# Patient Record
Sex: Male | Born: 1935 | Race: White | Hispanic: No | Smoking: Current some day smoker
Health system: Southern US, Community
[De-identification: ages and names within clinical notes are randomized; demographics above are authoritative.]

## PROBLEM LIST (undated history)

## (undated) DIAGNOSIS — K5900 Constipation, unspecified: Secondary | ICD-10-CM

## (undated) DIAGNOSIS — F32A Depression, unspecified: Secondary | ICD-10-CM

## (undated) DIAGNOSIS — G4700 Insomnia, unspecified: Secondary | ICD-10-CM

## (undated) DIAGNOSIS — G4730 Sleep apnea, unspecified: Secondary | ICD-10-CM

## (undated) DIAGNOSIS — E119 Type 2 diabetes mellitus without complications: Secondary | ICD-10-CM

## (undated) DIAGNOSIS — I701 Atherosclerosis of renal artery: Secondary | ICD-10-CM

## (undated) DIAGNOSIS — N400 Enlarged prostate without lower urinary tract symptoms: Secondary | ICD-10-CM

## (undated) DIAGNOSIS — N529 Male erectile dysfunction, unspecified: Secondary | ICD-10-CM

## (undated) DIAGNOSIS — F329 Major depressive disorder, single episode, unspecified: Secondary | ICD-10-CM

## (undated) DIAGNOSIS — I739 Peripheral vascular disease, unspecified: Secondary | ICD-10-CM

## (undated) DIAGNOSIS — M1990 Unspecified osteoarthritis, unspecified site: Secondary | ICD-10-CM

## (undated) DIAGNOSIS — Z8614 Personal history of Methicillin resistant Staphylococcus aureus infection: Secondary | ICD-10-CM

## (undated) DIAGNOSIS — I4891 Unspecified atrial fibrillation: Secondary | ICD-10-CM

## (undated) DIAGNOSIS — I2510 Atherosclerotic heart disease of native coronary artery without angina pectoris: Secondary | ICD-10-CM

## (undated) DIAGNOSIS — I10 Essential (primary) hypertension: Secondary | ICD-10-CM

## (undated) DIAGNOSIS — I519 Heart disease, unspecified: Secondary | ICD-10-CM

## (undated) HISTORY — PX: CORONARY ANGIOPLASTY: SHX604

## (undated) HISTORY — PX: CORONARY ANGIOPLASTY WITH STENT PLACEMENT: SHX49

## (undated) HISTORY — DX: Atherosclerotic heart disease of native coronary artery without angina pectoris: I25.10

## (undated) HISTORY — DX: Atherosclerosis of renal artery: I70.1

## (undated) HISTORY — PX: TURP VAPORIZATION: SUR1397

## (undated) HISTORY — PX: ADENOIDECTOMY: SUR15

## (undated) HISTORY — DX: Essential (primary) hypertension: I10

## (undated) HISTORY — PX: COLONOSCOPY: SHX174

## (undated) HISTORY — DX: Heart disease, unspecified: I51.9

## (undated) HISTORY — DX: Benign prostatic hyperplasia without lower urinary tract symptoms: N40.0

## (undated) HISTORY — DX: Unspecified atrial fibrillation: I48.91

## (undated) HISTORY — DX: Male erectile dysfunction, unspecified: N52.9

## (undated) HISTORY — PX: TONSILLECTOMY: SUR1361

---

## 1997-06-15 ENCOUNTER — Other Ambulatory Visit: Admission: RE | Admit: 1997-06-15 | Discharge: 1997-06-15 | Payer: Self-pay | Admitting: Urology

## 1998-06-30 ENCOUNTER — Observation Stay (HOSPITAL_COMMUNITY): Admission: RE | Admit: 1998-06-30 | Discharge: 1998-07-01 | Payer: Self-pay | Admitting: Specialist

## 1998-07-11 ENCOUNTER — Ambulatory Visit (HOSPITAL_COMMUNITY): Admission: RE | Admit: 1998-07-11 | Discharge: 1998-07-11 | Payer: Self-pay | Admitting: Gastroenterology

## 1998-10-30 ENCOUNTER — Emergency Department (HOSPITAL_COMMUNITY): Admission: EM | Admit: 1998-10-30 | Discharge: 1998-10-30 | Payer: Self-pay | Admitting: Emergency Medicine

## 1998-11-04 ENCOUNTER — Encounter: Payer: Self-pay | Admitting: Emergency Medicine

## 1998-11-04 ENCOUNTER — Emergency Department (HOSPITAL_COMMUNITY): Admission: EM | Admit: 1998-11-04 | Discharge: 1998-11-04 | Payer: Self-pay

## 1998-12-04 ENCOUNTER — Encounter: Payer: Self-pay | Admitting: Orthopedic Surgery

## 1998-12-06 ENCOUNTER — Inpatient Hospital Stay (HOSPITAL_COMMUNITY): Admission: EM | Admit: 1998-12-06 | Discharge: 1998-12-07 | Payer: Self-pay | Admitting: Orthopedic Surgery

## 1999-03-05 HISTORY — PX: BICEPS TENDON REPAIR: SHX566

## 1999-03-05 HISTORY — PX: SHOULDER OPEN ROTATOR CUFF REPAIR: SHX2407

## 1999-07-06 ENCOUNTER — Encounter: Payer: Self-pay | Admitting: Family Medicine

## 1999-07-06 ENCOUNTER — Encounter: Admission: RE | Admit: 1999-07-06 | Discharge: 1999-07-06 | Payer: Self-pay | Admitting: Family Medicine

## 1999-07-18 ENCOUNTER — Emergency Department (HOSPITAL_COMMUNITY): Admission: EM | Admit: 1999-07-18 | Discharge: 1999-07-18 | Payer: Self-pay | Admitting: Emergency Medicine

## 1999-10-04 ENCOUNTER — Encounter (INDEPENDENT_AMBULATORY_CARE_PROVIDER_SITE_OTHER): Payer: Self-pay | Admitting: Specialist

## 1999-10-04 ENCOUNTER — Other Ambulatory Visit: Admission: RE | Admit: 1999-10-04 | Discharge: 1999-10-04 | Payer: Self-pay | Admitting: Urology

## 1999-10-11 ENCOUNTER — Inpatient Hospital Stay (HOSPITAL_COMMUNITY): Admission: EM | Admit: 1999-10-11 | Discharge: 1999-10-15 | Payer: Self-pay

## 1999-12-18 ENCOUNTER — Ambulatory Visit (HOSPITAL_COMMUNITY): Admission: RE | Admit: 1999-12-18 | Discharge: 1999-12-18 | Payer: Self-pay | Admitting: Gastroenterology

## 1999-12-18 ENCOUNTER — Encounter (INDEPENDENT_AMBULATORY_CARE_PROVIDER_SITE_OTHER): Payer: Self-pay

## 2000-01-09 ENCOUNTER — Ambulatory Visit (HOSPITAL_BASED_OUTPATIENT_CLINIC_OR_DEPARTMENT_OTHER): Admission: RE | Admit: 2000-01-09 | Discharge: 2000-01-09 | Payer: Self-pay | Admitting: Urology

## 2000-02-05 ENCOUNTER — Encounter: Admission: RE | Admit: 2000-02-05 | Discharge: 2000-02-05 | Payer: Self-pay | Admitting: Gastroenterology

## 2000-02-05 ENCOUNTER — Encounter: Payer: Self-pay | Admitting: Gastroenterology

## 2000-03-04 HISTORY — PX: CARPAL TUNNEL RELEASE: SHX101

## 2000-04-17 ENCOUNTER — Ambulatory Visit (HOSPITAL_COMMUNITY): Admission: RE | Admit: 2000-04-17 | Discharge: 2000-04-17 | Payer: Self-pay | Admitting: Cardiology

## 2000-04-28 ENCOUNTER — Ambulatory Visit (HOSPITAL_COMMUNITY): Admission: RE | Admit: 2000-04-28 | Discharge: 2000-04-28 | Payer: Self-pay | Admitting: Cardiovascular Disease

## 2000-08-02 ENCOUNTER — Emergency Department (HOSPITAL_COMMUNITY): Admission: EM | Admit: 2000-08-02 | Discharge: 2000-08-02 | Payer: Self-pay | Admitting: Emergency Medicine

## 2000-12-03 ENCOUNTER — Ambulatory Visit (HOSPITAL_COMMUNITY): Admission: RE | Admit: 2000-12-03 | Discharge: 2000-12-03 | Payer: Self-pay | Admitting: Cardiology

## 2001-02-16 ENCOUNTER — Ambulatory Visit (HOSPITAL_COMMUNITY): Admission: RE | Admit: 2001-02-16 | Discharge: 2001-02-16 | Payer: Self-pay | Admitting: Interventional Cardiology

## 2001-03-20 ENCOUNTER — Encounter: Payer: Self-pay | Admitting: Orthopedic Surgery

## 2001-03-20 ENCOUNTER — Observation Stay (HOSPITAL_COMMUNITY): Admission: RE | Admit: 2001-03-20 | Discharge: 2001-03-21 | Payer: Self-pay | Admitting: Orthopedic Surgery

## 2001-03-20 HISTORY — PX: FOOT SURGERY: SHX648

## 2003-06-30 ENCOUNTER — Ambulatory Visit (HOSPITAL_COMMUNITY): Admission: RE | Admit: 2003-06-30 | Discharge: 2003-07-01 | Payer: Self-pay | Admitting: Otolaryngology

## 2003-06-30 ENCOUNTER — Encounter (INDEPENDENT_AMBULATORY_CARE_PROVIDER_SITE_OTHER): Payer: Self-pay | Admitting: Specialist

## 2003-06-30 HISTORY — PX: UVULOPALATOPHARYNGOPLASTY (UPPP)/TONSILLECTOMY/SEPTOPLASTY: SHX6164

## 2003-07-26 ENCOUNTER — Encounter: Admission: RE | Admit: 2003-07-26 | Discharge: 2003-07-26 | Payer: Self-pay | Admitting: Otolaryngology

## 2004-01-06 ENCOUNTER — Encounter: Admission: RE | Admit: 2004-01-06 | Discharge: 2004-01-06 | Payer: Self-pay | Admitting: Otolaryngology

## 2005-03-04 HISTORY — PX: INSERT / REPLACE / REMOVE PACEMAKER: SUR710

## 2005-03-26 ENCOUNTER — Ambulatory Visit: Payer: Self-pay | Admitting: Gastroenterology

## 2005-04-04 ENCOUNTER — Ambulatory Visit: Payer: Self-pay | Admitting: Family Medicine

## 2005-04-12 ENCOUNTER — Ambulatory Visit: Payer: Self-pay | Admitting: Gastroenterology

## 2005-04-23 ENCOUNTER — Ambulatory Visit: Payer: Self-pay | Admitting: Gastroenterology

## 2005-05-06 ENCOUNTER — Ambulatory Visit (HOSPITAL_COMMUNITY): Admission: RE | Admit: 2005-05-06 | Discharge: 2005-05-06 | Payer: Self-pay | Admitting: Gastroenterology

## 2005-05-13 ENCOUNTER — Ambulatory Visit (HOSPITAL_COMMUNITY): Admission: RE | Admit: 2005-05-13 | Discharge: 2005-05-13 | Payer: Self-pay | Admitting: Gastroenterology

## 2006-01-06 ENCOUNTER — Ambulatory Visit: Payer: Self-pay | Admitting: Family Medicine

## 2006-04-08 ENCOUNTER — Ambulatory Visit: Payer: Self-pay | Admitting: Family Medicine

## 2006-04-25 ENCOUNTER — Ambulatory Visit: Payer: Self-pay | Admitting: Family Medicine

## 2006-05-30 ENCOUNTER — Ambulatory Visit: Payer: Self-pay | Admitting: Internal Medicine

## 2006-10-02 ENCOUNTER — Inpatient Hospital Stay (HOSPITAL_BASED_OUTPATIENT_CLINIC_OR_DEPARTMENT_OTHER): Admission: RE | Admit: 2006-10-02 | Discharge: 2006-10-02 | Payer: Self-pay | Admitting: Interventional Cardiology

## 2006-10-13 ENCOUNTER — Inpatient Hospital Stay (HOSPITAL_COMMUNITY): Admission: RE | Admit: 2006-10-13 | Discharge: 2006-10-14 | Payer: Self-pay | Admitting: Interventional Cardiology

## 2006-10-27 ENCOUNTER — Ambulatory Visit (HOSPITAL_COMMUNITY): Admission: RE | Admit: 2006-10-27 | Discharge: 2006-10-28 | Payer: Self-pay | Admitting: Interventional Cardiology

## 2006-11-06 ENCOUNTER — Ambulatory Visit: Payer: Self-pay | Admitting: Gastroenterology

## 2006-11-20 ENCOUNTER — Encounter: Payer: Self-pay | Admitting: Family Medicine

## 2006-12-03 ENCOUNTER — Ambulatory Visit (HOSPITAL_COMMUNITY): Admission: RE | Admit: 2006-12-03 | Discharge: 2006-12-03 | Payer: Self-pay | Admitting: Interventional Cardiology

## 2006-12-03 ENCOUNTER — Ambulatory Visit: Payer: Self-pay | Admitting: Vascular Surgery

## 2006-12-03 ENCOUNTER — Encounter (INDEPENDENT_AMBULATORY_CARE_PROVIDER_SITE_OTHER): Payer: Self-pay | Admitting: Interventional Cardiology

## 2007-01-01 ENCOUNTER — Encounter (HOSPITAL_COMMUNITY): Admission: RE | Admit: 2007-01-01 | Discharge: 2007-03-04 | Payer: Self-pay | Admitting: Interventional Cardiology

## 2007-01-01 ENCOUNTER — Encounter: Payer: Self-pay | Admitting: Family Medicine

## 2007-01-23 ENCOUNTER — Ambulatory Visit: Payer: Self-pay | Admitting: Family Medicine

## 2007-01-23 DIAGNOSIS — M7710 Lateral epicondylitis, unspecified elbow: Secondary | ICD-10-CM | POA: Insufficient documentation

## 2007-01-23 DIAGNOSIS — Z87898 Personal history of other specified conditions: Secondary | ICD-10-CM

## 2007-01-23 DIAGNOSIS — E785 Hyperlipidemia, unspecified: Secondary | ICD-10-CM | POA: Insufficient documentation

## 2007-01-23 DIAGNOSIS — N400 Enlarged prostate without lower urinary tract symptoms: Secondary | ICD-10-CM | POA: Insufficient documentation

## 2007-01-23 DIAGNOSIS — I482 Chronic atrial fibrillation, unspecified: Secondary | ICD-10-CM

## 2007-01-23 DIAGNOSIS — I10 Essential (primary) hypertension: Secondary | ICD-10-CM | POA: Insufficient documentation

## 2007-03-05 ENCOUNTER — Encounter (HOSPITAL_COMMUNITY): Admission: RE | Admit: 2007-03-05 | Discharge: 2007-05-02 | Payer: Self-pay | Admitting: Interventional Cardiology

## 2007-03-05 HISTORY — PX: RENAL ARTERY STENT: SHX2321

## 2007-04-14 ENCOUNTER — Encounter: Payer: Self-pay | Admitting: Family Medicine

## 2007-09-18 ENCOUNTER — Emergency Department (HOSPITAL_COMMUNITY): Admission: EM | Admit: 2007-09-18 | Discharge: 2007-09-18 | Payer: Self-pay | Admitting: Emergency Medicine

## 2007-09-18 ENCOUNTER — Ambulatory Visit: Payer: Self-pay | Admitting: Family Medicine

## 2007-09-18 DIAGNOSIS — M109 Gout, unspecified: Secondary | ICD-10-CM

## 2007-09-18 DIAGNOSIS — I2510 Atherosclerotic heart disease of native coronary artery without angina pectoris: Secondary | ICD-10-CM | POA: Insufficient documentation

## 2007-10-02 ENCOUNTER — Encounter (INDEPENDENT_AMBULATORY_CARE_PROVIDER_SITE_OTHER): Payer: Self-pay | Admitting: *Deleted

## 2007-10-20 ENCOUNTER — Encounter: Payer: Self-pay | Admitting: Family Medicine

## 2008-01-04 ENCOUNTER — Encounter: Admission: RE | Admit: 2008-01-04 | Discharge: 2008-01-04 | Payer: Self-pay | Admitting: Orthopedic Surgery

## 2008-03-07 ENCOUNTER — Encounter: Payer: Self-pay | Admitting: Family Medicine

## 2008-08-22 ENCOUNTER — Ambulatory Visit: Payer: Self-pay | Admitting: Family Medicine

## 2008-08-22 DIAGNOSIS — R1084 Generalized abdominal pain: Secondary | ICD-10-CM

## 2008-08-23 ENCOUNTER — Encounter: Payer: Self-pay | Admitting: Family Medicine

## 2008-08-24 LAB — CONVERTED CEMR LAB
ALT: 28 units/L (ref 0–53)
AST: 25 units/L (ref 0–37)
Albumin: 3.8 g/dL (ref 3.5–5.2)
Alkaline Phosphatase: 44 units/L (ref 39–117)
Amylase: 40 units/L (ref 27–131)
BUN: 28 mg/dL — ABNORMAL HIGH (ref 6–23)
Basophils Absolute: 0 10*3/uL (ref 0.0–0.1)
Basophils Relative: 0.1 % (ref 0.0–3.0)
Bilirubin, Direct: 0.1 mg/dL (ref 0.0–0.3)
CO2: 27 meq/L (ref 19–32)
Calcium: 9.2 mg/dL (ref 8.4–10.5)
Chloride: 104 meq/L (ref 96–112)
Creatinine, Ser: 1.5 mg/dL (ref 0.4–1.5)
Eosinophils Absolute: 0 10*3/uL (ref 0.0–0.7)
Eosinophils Relative: 0.3 % (ref 0.0–5.0)
GFR calc non Af Amer: 48.84 mL/min (ref >60)
Glucose, Bld: 138 mg/dL — ABNORMAL HIGH (ref 70–99)
HCT: 45.7 % (ref 39.0–52.0)
Hemoglobin: 15.9 g/dL (ref 13.0–17.0)
Lymphocytes Relative: 9.4 % — ABNORMAL LOW (ref 12.0–46.0)
Lymphs Abs: 0.9 10*3/uL (ref 0.7–4.0)
MCHC: 34.8 g/dL (ref 30.0–36.0)
MCV: 87.8 fL (ref 78.0–100.0)
Monocytes Absolute: 0.2 10*3/uL (ref 0.1–1.0)
Monocytes Relative: 1.7 % — ABNORMAL LOW (ref 3.0–12.0)
Neutro Abs: 8.1 10*3/uL — ABNORMAL HIGH (ref 1.4–7.7)
Neutrophils Relative %: 88.5 % — ABNORMAL HIGH (ref 43.0–77.0)
Platelets: 169 10*3/uL (ref 150.0–400.0)
Potassium: 4.9 meq/L (ref 3.5–5.1)
RBC: 5.2 M/uL (ref 4.22–5.81)
RDW: 13.5 % (ref 11.5–14.6)
Sodium: 140 meq/L (ref 135–145)
TSH: 1.04 microintl units/mL (ref 0.35–5.50)
Total Bilirubin: 1.3 mg/dL — ABNORMAL HIGH (ref 0.3–1.2)
Total Protein: 7 g/dL (ref 6.0–8.3)
WBC: 9.2 10*3/uL (ref 4.5–10.5)

## 2008-11-03 ENCOUNTER — Encounter (INDEPENDENT_AMBULATORY_CARE_PROVIDER_SITE_OTHER): Payer: Self-pay | Admitting: *Deleted

## 2008-11-18 ENCOUNTER — Encounter: Payer: Self-pay | Admitting: Family Medicine

## 2008-12-02 ENCOUNTER — Encounter: Payer: Self-pay | Admitting: Family Medicine

## 2008-12-09 ENCOUNTER — Encounter: Payer: Self-pay | Admitting: Family Medicine

## 2008-12-29 ENCOUNTER — Ambulatory Visit: Payer: Self-pay | Admitting: Family Medicine

## 2008-12-29 DIAGNOSIS — R42 Dizziness and giddiness: Secondary | ICD-10-CM | POA: Insufficient documentation

## 2008-12-29 LAB — CONVERTED CEMR LAB
Bilirubin Urine: NEGATIVE
Blood in Urine, dipstick: NEGATIVE
Ketones, urine, test strip: NEGATIVE
Nitrite: NEGATIVE
Specific Gravity, Urine: 1.015
Urobilinogen, UA: 0.2
WBC Urine, dipstick: NEGATIVE
pH: 5

## 2008-12-30 LAB — CONVERTED CEMR LAB
ALT: 33 units/L (ref 0–53)
AST: 21 units/L (ref 0–37)
Albumin: 3.4 g/dL — ABNORMAL LOW (ref 3.5–5.2)
Alkaline Phosphatase: 51 units/L (ref 39–117)
BUN: 46 mg/dL — ABNORMAL HIGH (ref 6–23)
Basophils Absolute: 0 10*3/uL (ref 0.0–0.1)
Basophils Relative: 0.1 % (ref 0.0–3.0)
Bilirubin, Direct: 0.1 mg/dL (ref 0.0–0.3)
CO2: 25 meq/L (ref 19–32)
Calcium: 8.9 mg/dL (ref 8.4–10.5)
Chloride: 101 meq/L (ref 96–112)
Creatinine, Ser: 2.1 mg/dL — ABNORMAL HIGH (ref 0.4–1.5)
Eosinophils Absolute: 0 10*3/uL (ref 0.0–0.7)
Eosinophils Relative: 0.6 % (ref 0.0–5.0)
Folate: 13 ng/mL
GFR calc non Af Amer: 33.09 mL/min (ref >60)
Glucose, Bld: 219 mg/dL — ABNORMAL HIGH (ref 70–99)
HCT: 43.8 % (ref 39.0–52.0)
Hemoglobin: 14.7 g/dL (ref 13.0–17.0)
Hgb A1c MFr Bld: 9 % — ABNORMAL HIGH (ref 4.6–6.5)
Lymphocytes Relative: 15.4 % (ref 12.0–46.0)
Lymphs Abs: 1.3 10*3/uL (ref 0.7–4.0)
MCHC: 33.5 g/dL (ref 30.0–36.0)
MCV: 91.2 fL (ref 78.0–100.0)
Monocytes Absolute: 0.7 10*3/uL (ref 0.1–1.0)
Monocytes Relative: 8.7 % (ref 3.0–12.0)
Neutro Abs: 6.3 10*3/uL (ref 1.4–7.7)
Neutrophils Relative %: 75.2 % (ref 43.0–77.0)
Platelets: 190 10*3/uL (ref 150.0–400.0)
Potassium: 5 meq/L (ref 3.5–5.1)
RBC: 4.8 M/uL (ref 4.22–5.81)
RDW: 13.5 % (ref 11.5–14.6)
Sodium: 133 meq/L — ABNORMAL LOW (ref 135–145)
TSH: 1.29 microintl units/mL (ref 0.35–5.50)
Total Bilirubin: 0.9 mg/dL (ref 0.3–1.2)
Total Protein: 6.6 g/dL (ref 6.0–8.3)
Vitamin B-12: 545 pg/mL (ref 211–911)
WBC: 8.3 10*3/uL (ref 4.5–10.5)

## 2009-01-10 ENCOUNTER — Ambulatory Visit: Payer: Self-pay | Admitting: Family Medicine

## 2009-01-16 ENCOUNTER — Ambulatory Visit: Payer: Self-pay | Admitting: Family Medicine

## 2009-01-16 ENCOUNTER — Telehealth: Payer: Self-pay | Admitting: Family Medicine

## 2009-01-24 ENCOUNTER — Ambulatory Visit: Payer: Self-pay | Admitting: Family Medicine

## 2009-01-24 DIAGNOSIS — G609 Hereditary and idiopathic neuropathy, unspecified: Secondary | ICD-10-CM

## 2009-02-02 ENCOUNTER — Encounter: Payer: Self-pay | Admitting: Family Medicine

## 2009-02-15 ENCOUNTER — Telehealth: Payer: Self-pay | Admitting: Family Medicine

## 2009-02-16 ENCOUNTER — Telehealth: Payer: Self-pay | Admitting: Family Medicine

## 2009-02-17 ENCOUNTER — Ambulatory Visit: Payer: Self-pay | Admitting: Family Medicine

## 2009-03-16 ENCOUNTER — Encounter: Payer: Self-pay | Admitting: Family Medicine

## 2009-04-26 ENCOUNTER — Emergency Department (HOSPITAL_COMMUNITY): Admission: EM | Admit: 2009-04-26 | Discharge: 2009-04-26 | Payer: Self-pay | Admitting: Emergency Medicine

## 2009-04-27 ENCOUNTER — Telehealth: Payer: Self-pay | Admitting: Family Medicine

## 2009-05-01 ENCOUNTER — Ambulatory Visit: Payer: Self-pay | Admitting: Family Medicine

## 2009-07-24 ENCOUNTER — Ambulatory Visit: Payer: Self-pay | Admitting: Family Medicine

## 2009-07-25 ENCOUNTER — Ambulatory Visit: Payer: Self-pay | Admitting: Family Medicine

## 2009-08-03 ENCOUNTER — Encounter: Payer: Self-pay | Admitting: Family Medicine

## 2009-08-14 ENCOUNTER — Encounter: Payer: Self-pay | Admitting: Family Medicine

## 2009-08-17 ENCOUNTER — Encounter: Payer: Self-pay | Admitting: Family Medicine

## 2009-08-22 ENCOUNTER — Ambulatory Visit: Payer: Self-pay | Admitting: Family Medicine

## 2009-08-22 DIAGNOSIS — L02419 Cutaneous abscess of limb, unspecified: Secondary | ICD-10-CM | POA: Insufficient documentation

## 2009-08-22 DIAGNOSIS — R599 Enlarged lymph nodes, unspecified: Secondary | ICD-10-CM | POA: Insufficient documentation

## 2009-08-22 DIAGNOSIS — L03119 Cellulitis of unspecified part of limb: Secondary | ICD-10-CM

## 2009-08-29 ENCOUNTER — Encounter: Payer: Self-pay | Admitting: Family Medicine

## 2009-09-14 ENCOUNTER — Encounter: Payer: Self-pay | Admitting: Family Medicine

## 2009-09-19 ENCOUNTER — Telehealth: Payer: Self-pay | Admitting: Family Medicine

## 2009-09-26 ENCOUNTER — Ambulatory Visit: Payer: Self-pay | Admitting: Family Medicine

## 2009-09-27 LAB — CONVERTED CEMR LAB
BUN: 25 mg/dL — ABNORMAL HIGH (ref 6–23)
CO2: 27 meq/L (ref 19–32)
Calcium: 8.8 mg/dL (ref 8.4–10.5)
Chloride: 108 meq/L (ref 96–112)
Creatinine, Ser: 1.9 mg/dL — ABNORMAL HIGH (ref 0.4–1.5)
Creatinine,U: 42.2 mg/dL
GFR calc non Af Amer: 37.52 mL/min (ref >60)
Glucose, Bld: 158 mg/dL — ABNORMAL HIGH (ref 70–99)
Hgb A1c MFr Bld: 8.1 % — ABNORMAL HIGH (ref 4.6–6.5)
Microalb Creat Ratio: 10 mg/g (ref 0.0–30.0)
Microalb, Ur: 4.2 mg/dL — ABNORMAL HIGH (ref 0.0–1.9)
Potassium: 5.5 meq/L — ABNORMAL HIGH (ref 3.5–5.1)
Sodium: 140 meq/L (ref 135–145)

## 2009-10-06 ENCOUNTER — Telehealth: Payer: Self-pay | Admitting: Family Medicine

## 2009-10-28 ENCOUNTER — Encounter: Payer: Self-pay | Admitting: Family Medicine

## 2009-10-29 ENCOUNTER — Encounter: Payer: Self-pay | Admitting: Family Medicine

## 2009-10-30 ENCOUNTER — Encounter: Payer: Self-pay | Admitting: Family Medicine

## 2009-10-30 ENCOUNTER — Telehealth: Payer: Self-pay | Admitting: Family Medicine

## 2009-11-01 ENCOUNTER — Ambulatory Visit: Payer: Self-pay | Admitting: Family Medicine

## 2009-11-01 ENCOUNTER — Telehealth: Payer: Self-pay | Admitting: Family Medicine

## 2009-11-01 DIAGNOSIS — N390 Urinary tract infection, site not specified: Secondary | ICD-10-CM

## 2009-11-01 DIAGNOSIS — N259 Disorder resulting from impaired renal tubular function, unspecified: Secondary | ICD-10-CM | POA: Insufficient documentation

## 2009-11-01 LAB — CONVERTED CEMR LAB
ALT: 33 U/L (ref 0–53)
AST: 34 U/L (ref 0–37)
Albumin: 3.1 g/dL — ABNORMAL LOW (ref 3.5–5.2)
Alkaline Phosphatase: 61 U/L (ref 39–117)
BUN: 34 mg/dL — ABNORMAL HIGH (ref 6–23)
Basophils Absolute: 0 K/uL (ref 0.0–0.1)
Basophils Relative: 0.5 % (ref 0.0–3.0)
Bilirubin, Direct: 0.2 mg/dL (ref 0.0–0.3)
CO2: 26 meq/L (ref 19–32)
Calcium: 9.3 mg/dL (ref 8.4–10.5)
Chloride: 108 meq/L (ref 96–112)
Creatinine, Ser: 2 mg/dL — ABNORMAL HIGH (ref 0.4–1.5)
Eosinophils Absolute: 0.1 K/uL (ref 0.0–0.7)
Eosinophils Relative: 1 % (ref 0.0–5.0)
GFR calc non Af Amer: 35.75 mL/min (ref >60)
Glucose, Bld: 148 mg/dL — ABNORMAL HIGH (ref 70–99)
HCT: 41.8 % (ref 39.0–52.0)
Hemoglobin: 14.1 g/dL (ref 13.0–17.0)
Hgb A1c MFr Bld: 7.2 % — ABNORMAL HIGH (ref 4.6–6.5)
INR: 2.1
Lymphocytes Relative: 13.5 % (ref 12.0–46.0)
Lymphs Abs: 0.8 K/uL (ref 0.7–4.0)
MCHC: 33.9 g/dL (ref 30.0–36.0)
MCV: 92.7 fL (ref 78.0–100.0)
Monocytes Absolute: 0.8 K/uL (ref 0.1–1.0)
Monocytes Relative: 14.4 % — ABNORMAL HIGH (ref 3.0–12.0)
Neutro Abs: 4.1 K/uL (ref 1.4–7.7)
Neutrophils Relative %: 70.6 % (ref 43.0–77.0)
Platelets: 140 K/uL — ABNORMAL LOW (ref 150.0–400.0)
Potassium: 5.7 meq/L — ABNORMAL HIGH (ref 3.5–5.1)
Pro B Natriuretic peptide (BNP): 870.9 pg/mL — ABNORMAL HIGH (ref 0.0–100.0)
RBC: 4.51 M/uL (ref 4.22–5.81)
RDW: 14.4 % (ref 11.5–14.6)
Sodium: 141 meq/L (ref 135–145)
TSH: 2.05 u[IU]/mL (ref 0.35–5.50)
Total Bilirubin: 1 mg/dL (ref 0.3–1.2)
Total CK: 124 U/L (ref 7–232)
Total Protein: 6.2 g/dL (ref 6.0–8.3)
WBC: 5.8 10*3/microliter (ref 4.5–10.5)

## 2009-11-02 ENCOUNTER — Telehealth: Payer: Self-pay | Admitting: Family Medicine

## 2009-11-03 ENCOUNTER — Telehealth: Payer: Self-pay | Admitting: Family Medicine

## 2009-11-04 ENCOUNTER — Encounter: Admission: RE | Admit: 2009-11-04 | Discharge: 2009-11-04 | Payer: Self-pay | Admitting: Family Medicine

## 2009-11-08 ENCOUNTER — Telehealth: Payer: Self-pay | Admitting: Family Medicine

## 2009-11-10 ENCOUNTER — Ambulatory Visit: Payer: Self-pay | Admitting: Family Medicine

## 2009-11-11 ENCOUNTER — Emergency Department (HOSPITAL_COMMUNITY): Admission: EM | Admit: 2009-11-11 | Discharge: 2009-11-11 | Payer: Self-pay | Admitting: Emergency Medicine

## 2009-11-13 ENCOUNTER — Telehealth: Payer: Self-pay | Admitting: Family Medicine

## 2009-11-22 ENCOUNTER — Telehealth: Payer: Self-pay | Admitting: Family Medicine

## 2009-11-24 ENCOUNTER — Telehealth: Payer: Self-pay | Admitting: Family Medicine

## 2009-12-04 ENCOUNTER — Ambulatory Visit: Payer: Self-pay | Admitting: Family Medicine

## 2009-12-06 ENCOUNTER — Encounter
Admission: RE | Admit: 2009-12-06 | Discharge: 2009-12-06 | Payer: Self-pay | Source: Home / Self Care | Attending: Family Medicine | Admitting: Family Medicine

## 2009-12-07 ENCOUNTER — Encounter: Payer: Self-pay | Admitting: Family Medicine

## 2009-12-14 ENCOUNTER — Telehealth: Payer: Self-pay | Admitting: Family Medicine

## 2009-12-18 ENCOUNTER — Telehealth: Payer: Self-pay | Admitting: Family Medicine

## 2009-12-18 ENCOUNTER — Ambulatory Visit: Payer: Self-pay | Admitting: Family Medicine

## 2009-12-18 LAB — CONVERTED CEMR LAB: Hgb A1c MFr Bld: 7.5 % — ABNORMAL HIGH (ref 4.6–6.5)

## 2009-12-25 ENCOUNTER — Observation Stay (HOSPITAL_COMMUNITY): Admission: RE | Admit: 2009-12-25 | Discharge: 2009-12-26 | Payer: Self-pay | Admitting: Cardiology

## 2009-12-26 HISTORY — PX: CARDIAC DEFIBRILLATOR PLACEMENT: SHX171

## 2010-01-10 ENCOUNTER — Encounter: Payer: Self-pay | Admitting: Family Medicine

## 2010-01-17 ENCOUNTER — Ambulatory Visit: Payer: Self-pay | Admitting: Family Medicine

## 2010-01-17 DIAGNOSIS — L259 Unspecified contact dermatitis, unspecified cause: Secondary | ICD-10-CM

## 2010-03-12 ENCOUNTER — Encounter: Payer: Self-pay | Admitting: Family Medicine

## 2010-03-12 ENCOUNTER — Ambulatory Visit
Admission: RE | Admit: 2010-03-12 | Discharge: 2010-03-12 | Payer: Self-pay | Source: Home / Self Care | Attending: Family Medicine | Admitting: Family Medicine

## 2010-03-15 ENCOUNTER — Encounter: Admit: 2010-03-15 | Payer: Self-pay | Admitting: Family Medicine

## 2010-03-15 ENCOUNTER — Ambulatory Visit
Admission: RE | Admit: 2010-03-15 | Discharge: 2010-03-15 | Payer: Self-pay | Source: Home / Self Care | Attending: Internal Medicine | Admitting: Internal Medicine

## 2010-03-15 ENCOUNTER — Telehealth: Payer: Self-pay | Admitting: Family Medicine

## 2010-03-20 ENCOUNTER — Telehealth: Payer: Self-pay | Admitting: Family Medicine

## 2010-03-22 ENCOUNTER — Encounter: Admit: 2010-03-22 | Payer: Self-pay | Admitting: Family Medicine

## 2010-03-26 ENCOUNTER — Encounter: Payer: Self-pay | Admitting: Family Medicine

## 2010-04-02 ENCOUNTER — Ambulatory Visit: Admit: 2010-04-02 | Payer: Self-pay | Admitting: Endocrinology

## 2010-04-03 NOTE — Progress Notes (Signed)
Summary: test results  Phone Note Call from Patient   Caller: sister, joann Call For: Nelwyn Salisbury MD Summary of Call: wants test results. Asking for brain MRI- she thinks something's not quite right. Initial call taken by: Raechel Ache, RN,  November 01, 2009 4:10 PM  Follow-up for Phone Call        called. Follow-up by: Raechel Ache, RN,  November 01, 2009 4:51 PM  New Problems: RENAL INSUFFICIENCY (ICD-588.9)   New Problems: RENAL INSUFFICIENCY (ICD-588.9)

## 2010-04-03 NOTE — Assessment & Plan Note (Signed)
Summary: follow up for ER/High Blood Pressure/cjr   Vital Signs:  Patient profile:   76 year old male Weight:      194 pounds Temp:     98.5 degrees F oral Pulse rate:   92 / minute BP sitting:   134 / 90  (left arm) Cuff size:   large  Vitals Entered By: Alfred Levins, CMA (May 01, 2009 9:54 AM) CC: ER f/u on hypertension   History of Present Illness: Here to follow up an ER visit on 04-26-09 for elevated BP. His BP at home had been as high as 200/110 and he was having arm pain, so he went tot the ER. While there his BP was stable, his cardiac enzymes were negative, and his other labs were normal. None of his meds were changed, and he was told to see [place]. He admits to not taking his meds regularly, and he admits to taking a lot of OTC cold and allergy meds over the past few weeks. These included Nyquil, Tylenol Cold and Sinus, and other meds containing pseudoephedrine. Today he feels fine.   Current Medications (verified): 1)  Coumadin 5 Mg Tabs (Warfarin Sodium) .... Sun, T,th 2)  Coumadin 2.5 Mg  Tabs (Warfarin Sodium) .... M,w,f,sat 3)  Lasix 40 Mg Tabs (Furosemide) .Marland Kitchen.. 1 By Mouth Once Daily 4)  Klor-Con M20 20 Meq  Tbcr (Potassium Chloride Crys Cr) .Marland Kitchen.. 1 By Mouth Qd 5)  Amlodipine Besylate 10 Mg  Tabs (Amlodipine Besylate) .... Once Daily 6)  Lisinopril 20 Mg Tabs (Lisinopril) .Marland Kitchen.. 1 Tablet By Mouth Daily 7)  Finasteride 5 Mg Tabs (Finasteride) .Marland Kitchen.. 1 By Mouth Daily 8)  Metformin Hcl 500 Mg Tabs (Metformin Hcl) .... Two Times A Day 9)  Accu-Chek Aviva  Strp (Glucose Blood) .... Test Two Times A Day 10)  Neurontin 300 Mg Caps (Gabapentin) .... Two Times A Day  Allergies (verified): No Known Drug Allergies  Past History:  Past Medical History: Reviewed history from 01/24/2009 and no changes required. Hypertension Hyperlipidemia Atrial fibrillation, sees Dr. Verdis Prime Benign prostatic hypertrophy Coronary artery disease, S/P stent placements Gout Diabetes mellitus,  type II  Review of Systems  The patient denies anorexia, fever, weight loss, weight gain, vision loss, decreased hearing, hoarseness, chest pain, syncope, dyspnea on exertion, peripheral edema, prolonged cough, headaches, hemoptysis, abdominal pain, melena, hematochezia, severe indigestion/heartburn, hematuria, incontinence, genital sores, muscle weakness, suspicious skin lesions, transient blindness, difficulty walking, depression, unusual weight change, abnormal bleeding, enlarged lymph nodes, angioedema, breast masses, and testicular masses.    Physical Exam  General:  Well-developed,well-nourished,in no acute distress; alert,appropriate and cooperative throughout examination Neck:  No deformities, masses, or tenderness noted. Lungs:  Normal respiratory effort, chest expands symmetrically. Lungs are clear to auscultation, no crackles or wheezes. Heart:  Normal rate and regular rhythm. S1 and S2 normal without gallop, murmur, click, rub or other extra sounds.   Impression & Recommendations:  Problem # 1:  HYPERTENSION (ICD-401.9) Assessment Deteriorated  His updated medication list for this problem includes:    Lasix 40 Mg Tabs (Furosemide) .Marland Kitchen... 1 by mouth once daily    Amlodipine Besylate 10 Mg Tabs (Amlodipine besylate) ..... Once daily    Lisinopril 20 Mg Tabs (Lisinopril) .Marland Kitchen..Marland Kitchen Two times a day  Problem # 2:  DIABETES MELLITUS, TYPE II (ICD-250.00) Assessment: Unchanged  His updated medication list for this problem includes:    Lisinopril 20 Mg Tabs (Lisinopril) .Marland Kitchen..Marland Kitchen Two times a day    Metformin Hcl 500 Mg Tabs (  Metformin hcl) .Marland Kitchen..Marland Kitchen Two times a day  Problem # 3:  DIZZINESS (ICD-780.4) Assessment: Improved  Problem # 4:  CORONARY ARTERY DISEASE (ICD-414.00) Assessment: Unchanged  His updated medication list for this problem includes:    Lasix 40 Mg Tabs (Furosemide) .Marland Kitchen... 1 by mouth once daily    Amlodipine Besylate 10 Mg Tabs (Amlodipine besylate) ..... Once daily     Lisinopril 20 Mg Tabs (Lisinopril) .Marland Kitchen..Marland Kitchen Two times a day  Problem # 5:  ATRIAL FIBRILLATION (ICD-427.31) Assessment: Unchanged  His updated medication list for this problem includes:    Coumadin 5 Mg Tabs (Warfarin sodium) ..... Sun, t,th    Coumadin 2.5 Mg Tabs (Warfarin sodium) ..... M,w,f,sat    Amlodipine Besylate 10 Mg Tabs (Amlodipine besylate) ..... Once daily  Complete Medication List: 1)  Coumadin 5 Mg Tabs (Warfarin sodium) .... Sun, t,th 2)  Coumadin 2.5 Mg Tabs (Warfarin sodium) .... M,w,f,sat 3)  Lasix 40 Mg Tabs (Furosemide) .Marland Kitchen.. 1 by mouth once daily 4)  Klor-con M20 20 Meq Tbcr (Potassium chloride crys cr) .Marland Kitchen.. 1 by mouth qd 5)  Amlodipine Besylate 10 Mg Tabs (Amlodipine besylate) .... Once daily 6)  Lisinopril 20 Mg Tabs (Lisinopril) .... Two times a day 7)  Finasteride 5 Mg Tabs (Finasteride) .Marland Kitchen.. 1 by mouth daily 8)  Metformin Hcl 500 Mg Tabs (Metformin hcl) .... Two times a day 9)  Accu-chek Aviva Strp (Glucose blood) .... Test two times a day 10)  Neurontin 300 Mg Caps (Gabapentin) .... Two times a day  Patient Instructions: 1)  Increase Lisinopril to 20 mg two times a day . I strongly encouragedhim to take his meds as prescribed. he will avoid any OTC meds except plain Tylenol and plain Mucinex.  2)  Please schedule a follow-up appointment in 1 month.  Prescriptions: LISINOPRIL 20 MG TABS (LISINOPRIL) two times a day  #60 x 11   Entered and Authorized by:   Nelwyn Salisbury MD   Signed by:   Nelwyn Salisbury MD on 05/01/2009   Method used:   Electronically to        [phone][facility] (retail)       [address]       [place], [place]  [address]       Ph: [phone] or [phone]       Fax: [phone]   RxID:   [id number]

## 2010-04-03 NOTE — Progress Notes (Signed)
Summary: call a nurse    [facility] Triage Call Report Triage Record Num: [id number] Operator: Geanie Berlin Patient Name: [name] Call Date & Time: 11/11/2009 4:40:23PM Patient Phone: [phone] PCP: Tera Mater. Clent Ridges Patient Gender: Male PCP Fax : [phone] Patient DOB: 29-Oct-1934 Practice Name: [facility] Reason for Call: 76 yo. Calling re known gout R foot with extreme pain with movement, redness, hot to touch & > swelling. No relief after steroid shot 11/10/09. FBS 69 at 0900 & 136 ac lunch. Advised to go to ED/UC now for new unbearable foot pain (with movement) per Diabetes Foot Problem Guideline. Protocol(s) Used: Diabetes: Foot Problems Protocol(s) Used: Foot Non-Injury Recommended Outcome per Protocol: See ED Immediately Reason for Outcome: Any foot problems AND has diabetes New unbearable foot (feet) pain Care Advice:  ~ Another adult should drive.  ~ Avoid weight bearing if possible.  ~ If available, bring recent log of blood sugars or bring blood glucose monitor with log of blood sugars.  ~ IMMEDIATE ACTION Write down provider's name. List or place the following in a bag for transport with the patient: current prescription and/or nonprescription medications; alternative treatments, therapies and medications; and street drugs.  ~ 09/

## 2010-04-03 NOTE — Progress Notes (Signed)
Summary: Daughter has Concerns  Phone Note Call from Patient Call back at [phone] (cell)   Caller: Daughter - Sarajane Marek Summary of Call: Pt's daughter Alvis Lemmings calling with concerns to father's health.  She has power of attorney(listed in chart) and feels like her father is neglected his healthcare needs.  He was treated in the ER last night for hypertension and afib. He was advised to follow up with PCP some time this week however he states he is doing fine and does not need treatment.  Would like to know what Dr. Clent Ridges recommends the family to do?  Initial call taken by: Trixie Dredge,  April 27, 2009 9:54 AM  Follow-up for Phone Call        I reviewed the ER note, and he seems to be stable for now. However they did reccomend that he see me within 7 days, and I strongly agree. have him see me early next week.  Follow-up by: Nelwyn Salisbury MD,  April 27, 2009 11:30 AM  Additional Follow-up for Phone Call Additional follow up Details #1::        Phone Call Completed, pt's daughter will call back and make appt Additional Follow-up by: Alfred Levins, CMA,  April 27, 2009 11:45 AM

## 2010-04-03 NOTE — Assessment & Plan Note (Signed)
Summary: POST HOSP F/U (UTI) // RS   Vital Signs:  Patient profile:   76 year old male Weight:      183 pounds BMI:     27.93 Pulse rate:   88 / minute Pulse rhythm:   irregular BP sitting:   130 / 90  (left arm) Cuff size:   regular  Vitals Entered By: Raechel Ache, RN (November 01, 2009 10:43 AM) CC: Hosp f/u for UTI, renal failure in [place].   History of Present Illness: Here to follow up a hospital stay in [place], [place] from 10-28-09 to 10-30-09 for a UTI that grew pan-sensitive E. coli. He was given IV Levaquin, and then was sent home on 5 days of oral Levaquin. While there his potassium was a bit high and the sodium was a bit low, and his creatinine went from 2.5 on admission down to 1.4 by his DC. His renal US showed no obstruction, but he had a questionable lesion on the left kidney. On contrasted CT this appears to me a mural septated cyst, but evaluation by MRI was recommended. He now feels much better with more ebergy. Appetite is still poor. He had back pain on admission and urinary frequency, and these have all resolved. No fevers. Drinking fluids.   Allergies: 1)  ! Lisinopril  Past History:  Past Medical History: Reviewed history from 08/22/2009 and no changes required. Hypertension Hyperlipidemia Atrial fibrillation, sees Dr. Verdis Prime Benign prostatic hypertrophy, sees Dr. Vic Blackbird Coronary artery disease, S/P stent placements Gout Diabetes mellitus, type II ED  Past Surgical History: Reviewed history from 02/17/2009 and no changes required. TURP Procedure had several ESI to the neck per Dr. Ethelene Hal steroid injections to both heels per Dr. Lestine Box  Review of Systems  The patient denies anorexia, fever, weight loss, weight gain, vision loss, decreased hearing, hoarseness, chest pain, syncope, dyspnea on exertion, peripheral edema, prolonged cough, headaches, hemoptysis, abdominal pain, melena, hematochezia, severe indigestion/heartburn, hematuria,  incontinence, genital sores, muscle weakness, suspicious skin lesions, transient blindness, difficulty walking, depression, unusual weight change, abnormal bleeding, enlarged lymph nodes, angioedema, breast masses, and testicular masses.    Physical Exam  General:  Well-developed,well-nourished,in no acute distress; alert,appropriate and cooperative throughout examination Neck:  No deformities, masses, or tenderness noted. Lungs:  Normal respiratory effort, chest expands symmetrically. Lungs are clear to auscultation, no crackles or wheezes. Heart:  normal rate, no murmur, no gallop, no rub, no JVD, and no HJR.  Irregularly irregular rhythm.  Abdomen:  Bowel sounds positive,abdomen soft and non-tender without masses, organomegaly or hernias noted. Extremities:  no edema   Impression & Recommendations:  Problem # 1:  UTI (ICD-599.0)  Problem # 2:  DIABETES MELLITUS, TYPE II (ICD-250.00)  The following medications were removed from the medication list:    Metformin Hcl 500 Mg Tabs (Metformin hcl) ..... Once daily His updated medication list for this problem includes:    Losartan Potassium 100 Mg Tabs (Losartan potassium) ..... Once daily    Lantus Solostar 100 Unit/ml Soln (Insulin glargine) .Marland Kitchen... 20 units at bedtime    Novolog Mix 70/30 Flexpen 70-30 % Susp (Insulin aspart prot & aspart) .Marland Kitchen... Give 5  units in am and 10 units in pm  Orders: UA Dipstick w/o Micro (automated)  (81003) Venipuncture (16109) TLB-CBC Platelet - w/Differential (85025-CBCD) TLB-BMP (Basic Metabolic Panel-BMET) (80048-METABOL) TLB-Hepatic/Liver Function Pnl (80076-HEPATIC) TLB-TSH (Thyroid Stimulating Hormone) (84443-TSH) TLB-CK Total Only(Creatine Kinase/CPK) (82550-CK) TLB-A1C / Hgb A1C (Glycohemoglobin) (83036-A1C) Specimen Handling (60454) Diabetic Clinic Referral (  Diabetic)  Problem # 3:  CORONARY ARTERY DISEASE (ICD-414.00)  His updated medication list for this problem includes:    Lasix 40 Mg  Tabs (Furosemide) .Marland Kitchen... 1/2 tab  by mouth once daily    Amlodipine Besylate 10 Mg Tabs (Amlodipine besylate) ..... Once daily    Losartan Potassium 100 Mg Tabs (Losartan potassium) ..... Once daily    Metoprolol Tartrate 25 Mg Tabs (Metoprolol tartrate) .Marland Kitchen... 1 two times a day  Orders: UA Dipstick w/o Micro (automated)  (81003) Venipuncture (66440) TLB-CBC Platelet - w/Differential (85025-CBCD) TLB-BMP (Basic Metabolic Panel-BMET) (80048-METABOL) TLB-Hepatic/Liver Function Pnl (80076-HEPATIC) TLB-TSH (Thyroid Stimulating Hormone) (84443-TSH) TLB-CK Total Only(Creatine Kinase/CPK) (82550-CK) TLB-A1C / Hgb A1C (Glycohemoglobin) (83036-A1C) TLB-BNP (B-Natriuretic Peptide) (83880-BNPR) Specimen Handling (34742)  Problem # 4:  ATRIAL FIBRILLATION (ICD-427.31)  His updated medication list for this problem includes:    Coumadin 5 Mg Tabs (Warfarin sodium) ..... Sun, t,th    Coumadin 2.5 Mg Tabs (Warfarin sodium) ..... M,w,f,sat    Amlodipine Besylate 10 Mg Tabs (Amlodipine besylate) ..... Once daily    Metoprolol Tartrate 25 Mg Tabs (Metoprolol tartrate) .Marland Kitchen... 1 two times a day  Orders: Protime (59563OV)  Problem # 5:  HYPERTENSION (ICD-401.9)  His updated medication list for this problem includes:    Lasix 40 Mg Tabs (Furosemide) .Marland Kitchen... 1/2 tab  by mouth once daily    Amlodipine Besylate 10 Mg Tabs (Amlodipine besylate) ..... Once daily    Losartan Potassium 100 Mg Tabs (Losartan potassium) ..... Once daily    Metoprolol Tartrate 25 Mg Tabs (Metoprolol tartrate) .Marland Kitchen... 1 two times a day  Complete Medication List: 1)  Coumadin 5 Mg Tabs (Warfarin sodium) .... Sun, t,th 2)  Coumadin 2.5 Mg Tabs (Warfarin sodium) .... M,w,f,sat 3)  Lasix 40 Mg Tabs (Furosemide) .... 1/2 tab  by mouth once daily 4)  Amlodipine Besylate 10 Mg Tabs (Amlodipine besylate) .... Once daily 5)  Accu-chek Aviva Strp (Glucose blood) .... Test two times a day 6)  Neurontin 300 Mg Caps (Gabapentin) .... Two times  a day 7)  Losartan Potassium 100 Mg Tabs (Losartan potassium) .... Once daily 8)  Metoprolol Tartrate 25 Mg Tabs (Metoprolol tartrate) .Marland Kitchen.. 1 two times a day 9)  Crestor 40 Mg Tabs (Rosuvastatin calcium) .Marland Kitchen.. 1 once daily 10)  Lantus Solostar 100 Unit/ml Soln (Insulin glargine) .... 20 units at bedtime 11)  Novolog Mix 70/30 Flexpen 70-30 % Susp (Insulin aspart prot & aspart) .... Give 5  units in am and 10 units in pm  Other Orders: Radiology Referral (Radiology)  Patient Instructions: 1)  He seems to be recovering well from the UTI. Finish out the antibiotics. get labs today including creatinine and an A1c. Stop Metformin and switch over to insulin, using a combination of Novolog Mix and Lantus. We will set up a renal MRI to further evaluate the left kidney lesion.  Prescriptions: METOPROLOL TARTRATE 25 MG TABS (METOPROLOL TARTRATE) 1 two times a day  #60 x 11   Entered and Authorized by:   Nelwyn Salisbury MD   Signed by:   Nelwyn Salisbury MD on 11/01/2009   Method used:   [facility] to        [facility] [phone][facility] (retail)       [address]       [place], [place]  [address]       Ph: [phone] or [phone]       Fax: [phone]   RxID:   [id number] NOVOLOG MIX 70/30  FLEXPEN 70-30 % SUSP (INSULIN ASPART PROT & ASPART) give 5  units in am and 10 units in pm  #30 x 11   Entered and Authorized by:   Nelwyn Salisbury MD   Signed by:   Nelwyn Salisbury MD on 11/01/2009   Method used:   Electronically to        [facility] [phone][facility] (retail)       [address]       [place], [place]  [address]       Ph: [phone] or [phone]       Fax: [phone]   RxID:   [id number] LANTUS SOLOSTAR 100 UNIT/ML SOLN (INSULIN GLARGINE) 20 units at bedtime  #30 x 11   Entered and Authorized by:   Nelwyn Salisbury MD   Signed by:   Nelwyn Salisbury MD on 11/01/2009   Method used:   Electronically to        [facility] [phone][facility] (retail)       [address]       [place], [place]  [address]        Ph: [phone] or [phone]       Fax: [phone]   RxID:   [id number]   Laboratory Results   Blood Tests   Date/Time Recieved: November 01, 2009 11:52 AM  Date/Time Reported: November 01, 2009 11:52 AM    INR: 2.1   (Normal Range: 0.88-1.12   Therap INR: 2.0-3.5) Comments: Wynona Canes, CMA  November 01, 2009 11:52 AM

## 2010-04-03 NOTE — Progress Notes (Signed)
Summary: WCB with fax # if needed 8-5  Phone Note Call from Patient Call back at [phone]   Summary of Call: Dr. Heather Burundi wants Glucose results before he can get his glasses.  Gave him results of Glucose 158 & A1C 8.1.  He'll call her with results & call back with fax number for her is needed.     Initial call taken by: Rudy Jew, RN,  October 06, 2009 12:56 PM

## 2010-04-03 NOTE — Progress Notes (Signed)
Summary: QUESTION CONCERNING MEDICATION  Phone Note Call from Patient   Caller: Patient Summary of Call: Pt called to speak with someone about his medication (insulin).... Pt can be reached at [phone].  Initial call taken by: Debbra Riding,  November 08, 2009 10:11 AM  Follow-up for Phone Call        Patient states that his blood sugar numbers are running high so he only took 10 units of his Lantus. Per the patient, he made this decision himself. I made the patient to take the med as prescribed, (20 units) keep record, and call us with his readings on Friday.  Patient expressed understanding and will call back on Friday with update. Follow-up by: Lucious Groves CMA,  November 08, 2009 3:43 PM  Additional Follow-up for Phone Call Additional follow up Details #1::        noted Additional Follow-up by: Nelwyn Salisbury MD,  November 08, 2009 4:49 PM

## 2010-04-03 NOTE — Progress Notes (Signed)
Summary: wants labs ordered  Phone Note Call from Patient Call back at [phone]   Caller: Patient--live call Summary of Call: dr Cleophas Dunker wanted pt to have aic labs. will see [facility] on monday for coumadin check. can order for a1c be sent there? Initial call taken by: Warnell Forester,  September 19, 2009 2:50 PM  Follow-up for Phone Call        He does need to get another A1c, but this should not be at the cardiology office. He needs to come here for that. Set up a BMET, A1c, and urine microalbumen for 250.00 Follow-up by: Nelwyn Salisbury MD,  September 19, 2009 5:06 PM  Additional Follow-up for Phone Call Additional follow up Details #1::        I called pt and he told me that Dr Elfredia Nevins took blood and he did an urine sample last week. please advise. he wants to avoid having unecessary labs. Additional Follow-up by: Warnell Forester,  September 21, 2009 8:27 AM    Additional Follow-up for Phone Call Additional follow up Details #2::    I understand. Dr. Aldean Ast would not have ordered any labs for diabetes, though. Therefore he should get an A1c and a urine microalbumen here Follow-up by: Nelwyn Salisbury MD,  September 21, 2009 8:50 AM  Additional Follow-up for Phone Call Additional follow up Details #3:: Details for Additional Follow-up Action Taken: [facility] needs appt here for lab Additional Follow-up by: Raechel Ache, RN,  September 21, 2009 9:18 AM

## 2010-04-03 NOTE — Letter (Signed)
Summary: [facility]  [facility]   Imported By: Maryln Gottron 09/20/2009 13:24:55  _____________________________________________________________________  External Attachment:    Type:   Image     Comment:   External Document

## 2010-04-03 NOTE — Progress Notes (Signed)
Summary: Dr. Shanda Howells office called. Need to know pts A1C lvl     Phone Note From Other Clinic Call back at [facility] at Dr. Shanda Howells [phone]   Caller: Dr. Heather Burundi - Nurse Selena Batten Summary of Call: Need to know pts last A1C lvl. Pls call asap.  Initial call taken by: Lucy Antigua,  September 19, 2009 1:55 PM  Follow-up for Phone Call        called. Follow-up by: Raechel Ache, RN,  September 19, 2009 2:31 PM

## 2010-04-03 NOTE — Progress Notes (Signed)
Summary: Pt still having night sweats and not sleeping  Phone Note Call from Patient Call back at [phone] cell   Caller: Patient Summary of Call: Pt called and is still having night sweat and is not sleeping. Pt says that he is having sames symptoms as previously discussed. Pt is going for MRI tomorrow. Also req status of Kidney Spec?    Initial call taken by: Lucy Antigua,  November 03, 2009 9:29 AM  Follow-up for Phone Call        call in Temazepam 30 mg at bedtime as needed for sleep, #30 with 2 rf. Also, we have done thew referral to [facility] but have not heard back from them. I suggest he contact [facility] about the actual appt. time Follow-up by: Nelwyn Salisbury MD,  November 03, 2009 11:50 AM  Additional Follow-up for Phone Call Additional follow up Details #1::        Rx Called In Additional Follow-up by: Raechel Ache, RN,  November 03, 2009 4:25 PM    New/Updated Medications: TEMAZEPAM 30 MG CAPS (TEMAZEPAM) 1 at bedtime Prescriptions: TEMAZEPAM 30 MG CAPS (TEMAZEPAM) 1 at bedtime  #30 x 2   Entered by:   Raechel Ache, RN   Authorized by:   Nelwyn Salisbury MD   Signed by:   Raechel Ache, RN on 11/03/2009   Method used:   Telephoned to ...       [facility] [address] (retail)       [address]       [place], [place]  [address]       Ph: [phone] or [phone]       Fax: [phone]   RxID:   [id number]

## 2010-04-03 NOTE — Letter (Signed)
Summary: [facility]  [facility]   Imported By: Maryln Gottron 08/09/2009 14:08:20  _____________________________________________________________________  External Attachment:    Type:   Image     Comment:   External Document

## 2010-04-03 NOTE — Assessment & Plan Note (Signed)
Summary: swollen lymph glands?/dm   Vital Signs:  Patient profile:   76 year old male Weight:      182 pounds BMI:     27.77 Temp:     97.9 degrees F oral Pulse rate:   60 / minute Pulse rhythm:   irregular BP sitting:   120 / 70  (left arm) Cuff size:   regular  Vitals Entered By: Raechel Ache, RN (August 22, 2009 11:08 AM) CC: C/o R groin pain ans swollen lymph nodes per urologist and feels light-headed.   History of Present Illness: Here to check some swollen lymph nodes in the right groin which appeared about 2 weeks ago. There are none on the left side, and no other lymph nodes anywhere else. No fevers. About the same time that these appeared, he struck his right shin on the edge of a step. it has been swollen and tender ever since.   Allergies: 1)  ! Lisinopril  Past History:  Past Medical History: Hypertension Hyperlipidemia Atrial fibrillation, sees Dr. Verdis Prime Benign prostatic hypertrophy, sees Dr. Vic Blackbird Coronary artery disease, S/P stent placements Gout Diabetes mellitus, type II ED  Past Surgical History: Reviewed history from 02/17/2009 and no changes required. TURP Procedure had several ESI to the neck per Dr. Ethelene Hal steroid injections to both heels per Dr. Lestine Box  Review of Systems  The patient denies anorexia, fever, weight loss, weight gain, vision loss, decreased hearing, hoarseness, chest pain, syncope, dyspnea on exertion, peripheral edema, prolonged cough, headaches, hemoptysis, abdominal pain, melena, hematochezia, severe indigestion/heartburn, hematuria, incontinence, genital sores, muscle weakness, suspicious skin lesions, transient blindness, difficulty walking, depression, unusual weight change, abnormal bleeding, angioedema, breast masses, and testicular masses.    Physical Exam  General:  Well-developed,well-nourished,in no acute distress; alert,appropriate and cooperative throughout examination Abdomen:  Bowel sounds  positive,abdomen soft and non-tender without masses, organomegaly or hernias noted. Genitalia:  Testes bilaterally descended without nodularity, tenderness or masses. No scrotal masses or lesions. No penis lesions or urethral discharge. Skin:  the right mid shin has an abrasion which is warm and tender, it is scabbed over, slightly swollen Inguinal Nodes:  several small tender right nodes   Impression & Recommendations:  Problem # 1:  LYMPHADENOPATHY, REACTIVE (ICD-785.6)  His updated medication list for this problem includes:    Keflex 500 Mg Caps (Cephalexin) .Marland Kitchen... Three times a day  Problem # 2:  CELLULITIS, LEG, RIGHT (ICD-682.6)  His updated medication list for this problem includes:    Keflex 500 Mg Caps (Cephalexin) .Marland Kitchen... Three times a day  Complete Medication List: 1)  Coumadin 5 Mg Tabs (Warfarin sodium) .... Sun, t,th 2)  Coumadin 2.5 Mg Tabs (Warfarin sodium) .... M,w,f,sat 3)  Lasix 40 Mg Tabs (Furosemide) .Marland Kitchen.. 1 by mouth once daily 4)  Klor-con M20 20 Meq Tbcr (Potassium chloride crys cr) .Marland Kitchen.. 1 by mouth qd 5)  Amlodipine Besylate 10 Mg Tabs (Amlodipine besylate) .... Once daily 6)  Metformin Hcl 500 Mg Tabs (Metformin hcl) .... Once daily 7)  Accu-chek Aviva Strp (Glucose blood) .... Test two times a day 8)  Neurontin 300 Mg Caps (Gabapentin) .... Two times a day 9)  Losartan Potassium 100 Mg Tabs (Losartan potassium) .... Once daily 10)  Keflex 500 Mg Caps (Cephalexin) .... Three times a day 11)  Finasteride 5 Mg Tabs (Finasteride) .... Once daily  Patient Instructions: 1)  Please schedule a follow-up appointment as needed .  Prescriptions: KEFLEX 500 MG CAPS (CEPHALEXIN) three times a  day  #30 x 0   Entered and Authorized by:   Nelwyn Salisbury MD   Signed by:   Nelwyn Salisbury MD on 08/22/2009   Method used:   [facility] to        [facility] [phone]* (retail)       [address]       [place], [place]  [address]       Ph: [phone] or [phone]       Fax:  [phone]   RxID:   [id number]

## 2010-04-03 NOTE — Progress Notes (Signed)
Summary: Rx  Phone Note From Pharmacy   [name] Summary of Call: needs Rx for pen needles for Lantus Initial call taken by: Raechel Ache, RN,  November 02, 2009 8:44 AM  Follow-up for Phone Call        please call these in for one year Follow-up by: Nelwyn Salisbury MD,  November 02, 2009 9:06 AM    New/Updated Medications: PEN NEEDLES 31G X 6 MM MISC (INSULIN PEN NEEDLE) UAD Prescriptions: PEN NEEDLES 31G X 6 MM MISC (INSULIN PEN NEEDLE) UAD  #200 x 6   Entered by:   Raechel Ache, RN   Authorized by:   Nelwyn Salisbury MD   Signed by:   Raechel Ache, RN on 11/02/2009   Method used:   [facility]ronically to        [facility] [phone][facility] (retail)       [address]       [place], [place]  [address]       Ph: [phone] or [phone]       Fax: [phone]   RxID:   [id number]

## 2010-04-03 NOTE — Assessment & Plan Note (Signed)
Summary: RASH ON STOMACH/LEGS AND ARMS/CJR   Vital Signs:  Patient profile:   76 year old male Weight:      190 pounds O2 Sat:      94 % Temp:     98.4 degrees F Pulse rate:   83 / minute BP sitting:   140 / 80  (left arm)  Vitals Entered By: Pura Spice, RN (January 17, 2010 9:24 AM) CC: rash on arms and abd itches and burns stated used his son oint last hs that helped --taclonex oint.   History of Present Illness: Here asking for a cream to help with itchy rashes over his arms and trunk. This has bothered him for several months. has tried some of his son's Taclonex, and it helps.   Allergies: 1)  ! Lisinopril  Past History:  Past Medical History: Reviewed history from 08/22/2009 and no changes required. Hypertension Hyperlipidemia Atrial fibrillation, sees Dr. Verdis Prime Benign prostatic hypertrophy, sees Dr. Vic Blackbird Coronary artery disease, S/P stent placements Gout Diabetes mellitus, type II ED  Review of Systems  The patient denies anorexia, fever, weight loss, weight gain, vision loss, decreased hearing, hoarseness, chest pain, syncope, dyspnea on exertion, peripheral edema, prolonged cough, headaches, hemoptysis, abdominal pain, melena, hematochezia, severe indigestion/heartburn, hematuria, incontinence, genital sores, muscle weakness, suspicious skin lesions, transient blindness, difficulty walking, depression, unusual weight change, abnormal bleeding, enlarged lymph nodes, angioedema, breast masses, and testicular masses.    Physical Exam  General:  Well-developed,well-nourished,in no acute distress; alert,appropriate and cooperative throughout examination Skin:  red macular scaly patches as above    Impression & Recommendations:  Problem # 1:  ECZEMA (ICD-692.9)  His updated medication list for this problem includes:    Ultravate 0.05 % Crea (Halobetasol propionate) .Marland Kitchen... Apply two times a day as needed  Complete Medication List: 1)   Coumadin 5 Mg Tabs (Warfarin sodium) .... Sun, t,th 2)  Coumadin 2.5 Mg Tabs (Warfarin sodium) .... M,w,f,sat 3)  Lasix 40 Mg Tabs (Furosemide) .Marland Kitchen.. 1 mouth once daily 4)  Amlodipine Besylate 10 Mg Tabs (Amlodipine besylate) .... 1/2 once daily 5)  Accu-chek Aviva Strp (Glucose blood) .... Test two times a day 6)  Losartan Potassium 100 Mg Tabs (Losartan potassium) .... Once daily 7)  Metoprolol Tartrate 25 Mg Tabs (Metoprolol tartrate) .Marland Kitchen.. 1 two times a day 8)  Novolog Mix 70/30 Flexpen 70-30 % Susp (Insulin aspart prot & aspart) .Marland Kitchen.. 10 units two times a day 9)  Pen Needles 31g X 6 Mm Misc (Insulin pen needle) .... Uad 10)  Hydrocodone-acetaminophen 7.5-325 Mg Tabs (Hydrocodone-acetaminophen) 11)  Finasteride 5 Mg Tabs (Finasteride) 12)  Ultravate 0.05 % Crea (Halobetasol propionate) .... Apply two times a day as needed  Patient Instructions: 1)  Please schedule a follow-up appointment as needed .  Prescriptions: ULTRAVATE 0.05 % CREA (HALOBETASOL PROPIONATE) apply two times a day as needed  #60 x 5   Entered and Authorized by:   Nelwyn Salisbury MD   Signed by:   Nelwyn Salisbury MD on 01/17/2010   Method used:   Electronically to        [facility] [phone]* (retail)       [address]       [place], [place]  [address]       Ph: [phone] or [phone]       Fax: [phone]   RxID:   [id number]    Orders Added: 1)  Est. Patient Level III [25956]

## 2010-04-03 NOTE — Letter (Signed)
Summary: [facility]  [facility]   Imported By: Maryln Gottron 08/31/2009 15:15:32  _____________________________________________________________________  External Attachment:    Type:   Image     Comment:   External Document

## 2010-04-03 NOTE — Letter (Signed)
Summary: [facility]  [facility]   Imported By: Maryln Gottron 03/22/2009 12:20:27  _____________________________________________________________________  External Attachment:    Type:   Image     Comment:   External Document

## 2010-04-03 NOTE — Progress Notes (Signed)
Summary: please advise of dm  Phone Note Call from Patient Call back at [phone]   Caller: Daughter---live call Summary of Call: on Insulin. Sugar levels have been in the 50-60 in the morning. He takes Lantus at night. Please advise. he is in [place] right now, visiting his daughter. Initial call taken by: Warnell Forester,  November 24, 2009 9:07 AM  Follow-up for Phone Call        continue the Novolog Mix but stop the Lantus at night  Follow-up by: Nelwyn Salisbury MD,  November 24, 2009 10:03 AM  Additional Follow-up for Phone Call Additional follow up Details #1::        pt aware.  Additional Follow-up by: Pura Spice, RN,  November 24, 2009 12:06 PM

## 2010-04-03 NOTE — Letter (Signed)
Summary: [facility]  Diabetic Eye Exam/[facility]   Imported By: Maryln Gottron 09/15/2009 14:38:49  _____________________________________________________________________  External Attachment:    Type:   Image     Comment:   External Document

## 2010-04-03 NOTE — Progress Notes (Signed)
Summary: note to fly  Phone Note Call from Patient Call back at Home Phone [phone]   Caller: Patient Call For: Nelwyn Salisbury MD Summary of Call: Pt is asking if he has to have a note for his needles, and insulin to fly??? Initial call taken by: Lynann Beaver CMA,  November 22, 2009 11:34 AM  Follow-up for Phone Call        I don't think a note form me would help him. My guess is that he may need to check in a bag with these supplies in it, since I doubt they would allow him to carry this on. he should ask someone at the airport this question Follow-up by: Nelwyn Salisbury MD,  November 22, 2009 12:52 PM  Additional Follow-up for Phone Call Additional follow up Details #1::        Left detailed message on voice mail. Additional Follow-up by: Lynann Beaver CMA,  November 22, 2009 1:36 PM

## 2010-04-03 NOTE — Assessment & Plan Note (Signed)
Summary: flu shot//ccm  Nurse Visit   Review of Systems       Flu Vaccine Consent Questions     Do you have a history of severe allergic reactions to this vaccine? no    Any prior history of allergic reactions to egg and/or gelatin? no    Do you have a sensitivity to the preservative Thimersol? no    Do you have a past history of Guillan-Barre Syndrome? no    Do you currently have an acute febrile illness? no    Have you ever had a severe reaction to latex? no    Vaccine information given and explained to patient? yes    Are you currently pregnant? no    Lot Number:[id number]   Exp Date:09/01/2010   Site Given  Left Deltoid IM Pura Spice, RN  December 04, 2009 10:17 AM    Allergies: 1)  ! Lisinopril  Orders Added: 1)  Flu Vaccine 22yrs + MEDICARE PATIENTS [Q2039] 2)  Administration Flu vaccine - MCR [G0008]

## 2010-04-03 NOTE — Assessment & Plan Note (Signed)
Summary: EPISODES OF HEAVY PERSPIRATION NIGHTS/PS   Vital Signs:  Patient profile:   76 year old male Weight:      192 pounds O2 Sat:      92 % Temp:     98.1 degrees F Pulse rate:   76 / minute BP sitting:   116 / 78  (left arm) Cuff size:   regular  Vitals Entered By: Pura Spice, RN (December 18, 2009 10:41 AM) CC: had alot perspiring last hs. felt lightheaded for about 5 min then ate chocolate and orange juicie.    History of Present Illness: Here for an episode of apparent hypoglycemia 3 nights ago. One week ago we switched from Lantus to Novolog Mix, and for the most part he has done well. His am fasting glucoses have been 85-105 and before dinner each evening has been 130-165. However 3 nights ago about 5 hours after eating a bowl of chili he suddenly felt weak and sweaty. He did not have SOB or chest pains. He did not check his glucose at that time unfortunately. He drank orange juice and ate some chocolate, and he felt better very quickly. he has felt fine since. Of note, his fasting glucose the morning after this event was only 60.   Allergies: 1)  ! Lisinopril  Past History:  Past Medical History: Reviewed history from 08/22/2009 and no changes required. Hypertension Hyperlipidemia Atrial fibrillation, sees Dr. Verdis Prime Benign prostatic hypertrophy, sees Dr. Vic Blackbird Coronary artery disease, S/P stent placements Gout Diabetes mellitus, type II ED  Review of Systems  The patient denies anorexia, fever, weight loss, weight gain, vision loss, decreased hearing, hoarseness, chest pain, syncope, dyspnea on exertion, peripheral edema, prolonged cough, headaches, hemoptysis, abdominal pain, melena, hematochezia, severe indigestion/heartburn, hematuria, incontinence, genital sores, muscle weakness, suspicious skin lesions, transient blindness, difficulty walking, depression, unusual weight change, abnormal bleeding, enlarged lymph nodes, angioedema, breast  masses, and testicular masses.    Physical Exam  General:  Well-developed,well-nourished,in no acute distress; alert,appropriate and cooperative throughout examination Neck:  No deformities, masses, or tenderness noted. Lungs:  Normal respiratory effort, chest expands symmetrically. Lungs are clear to auscultation, no crackles or wheezes. Heart:  Normal rate and regular rhythm. S1 and S2 normal without gallop, murmur, click, rub or other extra sounds. Neurologic:  alert & oriented X3, cranial nerves II-XII intact, and gait normal.     Impression & Recommendations:  Problem # 1:  DIABETES MELLITUS, TYPE II (ICD-250.00)  The following medications were removed from the medication list:    Lantus Solostar 100 Unit/ml Soln (Insulin glargine) .Marland Kitchen... 20 units at bedtime His updated medication list for this problem includes:    Losartan Potassium 100 Mg Tabs (Losartan potassium) ..... Once daily    Novolog Mix 70/30 Flexpen 70-30 % Susp (Insulin aspart prot & aspart) .Marland KitchenMarland KitchenMarland KitchenMarland Kitchen 10 units two times a day  Orders: Venipuncture ([address]) TLB-A1C / Hgb A1C (Glycohemoglobin) (83036-A1C) Specimen Handling (60454)  Problem # 2:  PERIPHERAL NEUROPATHY (ICD-356.9)  Problem # 3:  ATRIAL FIBRILLATION (ICD-427.31)  His updated medication list for this problem includes:    Coumadin 5 Mg Tabs (Warfarin sodium) ..... Sun, t,th    Coumadin 2.5 Mg Tabs (Warfarin sodium) ..... M,w,f,sat    Amlodipine Besylate 10 Mg Tabs (Amlodipine besylate) ..... Once daily    Metoprolol Tartrate 25 Mg Tabs (Metoprolol tartrate) .Marland Kitchen... 1 two times a day  Problem # 4:  HYPERTENSION (ICD-401.9)  His updated medication list for this problem includes:  Lasix 40 Mg Tabs (Furosemide) .Marland Kitchen... 1/2 tab  by mouth once daily    Amlodipine Besylate 10 Mg Tabs (Amlodipine besylate) ..... Once daily    Losartan Potassium 100 Mg Tabs (Losartan potassium) ..... Once daily    Metoprolol Tartrate 25 Mg Tabs (Metoprolol tartrate) .Marland Kitchen... 1 two  times a day  Complete Medication List: 1)  Coumadin 5 Mg Tabs (Warfarin sodium) .... Sun, t,th 2)  Coumadin 2.5 Mg Tabs (Warfarin sodium) .... M,w,f,sat 3)  Lasix 40 Mg Tabs (Furosemide) .... 1/2 tab  by mouth once daily 4)  Amlodipine Besylate 10 Mg Tabs (Amlodipine besylate) .... Once daily 5)  Accu-chek Aviva Strp (Glucose blood) .... Test two times a day 6)  Neurontin 300 Mg Caps (Gabapentin) .... Two times a day 7)  Losartan Potassium 100 Mg Tabs (Losartan potassium) .... Once daily 8)  Metoprolol Tartrate 25 Mg Tabs (Metoprolol tartrate) .Marland Kitchen.. 1 two times a day 9)  Crestor 40 Mg Tabs (Rosuvastatin calcium) .Marland Kitchen.. 1 once daily 10)  Novolog Mix 70/30 Flexpen 70-30 % Susp (Insulin aspart prot & aspart) .Marland Kitchen.. 10 units two times a day 11)  Pen Needles 31g X 6 Mm Misc (Insulin pen needle) .... Uad 12)  Temazepam 30 Mg Caps (Temazepam) .Marland Kitchen.. 1 at bedtime  Patient Instructions: 1)  Decrease his evening dose of Novolog Mix to 10 units. Get an A1c today.  2)  Please schedule a follow-up appointment in 1 month.    Orders Added: 1)  Venipuncture [36415] 2)  TLB-A1C / Hgb A1C (Glycohemoglobin) [83036-A1C] 3)  Est. Patient Level IV [16109] 4)  Specimen Handling [99000]

## 2010-04-03 NOTE — Assessment & Plan Note (Signed)
Summary: PAIN ALL OVER//PROBLEMS WITH MOVEMENT//ALP   Vital Signs:  Patient profile:   76 year old male Weight:      189 pounds Temp:     98.3 degrees F oral BP sitting:   140 / 76  (left arm) Cuff size:   regular  Vitals Entered By: Sid Falcon LPN (Jul 24, 2009 11:51 AM) CC: Pain all over ? Shingles   History of Present Illness: Patient seen as work in with initial complaints of "hurting all over his body". On further questioning he had onset last night of acute pruritus and really has more itching than pain. Symptoms mostly involving the trunk. Denies any fever, nasal congestion, cough, change in soaps or detergent, or any recent change in medication.  No hx food allergies.  Rash initially noted right trunk region seems to be fading somewhat. No recent new meds. Patient takes several chronic medications. Coumadin followed by cardiologist with INR this morning. Patient denies any dyspnea.  Allergies (verified): No Known Drug Allergies  Past History:  Past Medical History: Last updated: 01/24/2009 Hypertension Hyperlipidemia Atrial fibrillation, sees Dr. Verdis Prime Benign prostatic hypertrophy Coronary artery disease, S/P stent placements Gout Diabetes mellitus, type II  Review of Systems  The patient denies anorexia, fever, weight loss, and headaches.    Physical Exam  General:  Well-developed,well-nourished,in no acute distress; alert,appropriate and cooperative throughout examination Ears:  External ear exam shows no significant lesions or deformities.  Otoscopic examination reveals clear canals, tympanic membranes are intact bilaterally without bulging, retraction, inflammation or discharge. Hearing is grossly normal bilaterally. Mouth:  Oral mucosa and oropharynx without lesions or exudates.  Teeth in good repair. Neck:  No deformities, masses, or tenderness noted. Lungs:  Normal respiratory effort, chest expands symmetrically. Lungs are clear to auscultation, no  crackles or wheezes. Heart:  normal rate and regular rhythm.   Skin:  patient has some scattered urticaria mostly right lateral hip region upper thigh and appear to be fading below the right axillary region. No vesicles   Impression & Recommendations:  Problem # 1:  URTICARIA, IDIOPATHIC (ICD-708.1) Assessment New try over-the-counter antihistamine such as Allegra or Zyrtec  and add H2 blocker as needed.  Complete Medication List: 1)  Coumadin 5 Mg Tabs (Warfarin sodium) .... Sun, t,th 2)  Coumadin 2.5 Mg Tabs (Warfarin sodium) .... M,w,f,sat 3)  Lasix 40 Mg Tabs (Furosemide) .Marland Kitchen.. 1 by mouth once daily 4)  Klor-con M20 20 Meq Tbcr (Potassium chloride crys cr) .Marland Kitchen.. 1 by mouth qd 5)  Amlodipine Besylate 10 Mg Tabs (Amlodipine besylate) .... Once daily 6)  Lisinopril 20 Mg Tabs (Lisinopril) .... Two times a day 7)  Finasteride 5 Mg Tabs (Finasteride) .Marland Kitchen.. 1 by mouth daily 8)  Metformin Hcl 500 Mg Tabs (Metformin hcl) .... Two times a day 9)  Accu-chek Aviva Strp (Glucose blood) .... Test two times a day 10)  Neurontin 300 Mg Caps (Gabapentin) .... Two times a day  Patient Instructions: 1)  Try antihistamine such as Allegra or Zyrtec. 2)  If no adequate relief with that, add Pepcid or Zantac.

## 2010-04-03 NOTE — Letter (Signed)
Summary: [facility]  [facility]   Imported By: Maryln Gottron 01/19/2010 14:16:44  _____________________________________________________________________  External Attachment:    Type:   Image     Comment:   External Document

## 2010-04-03 NOTE — Assessment & Plan Note (Signed)
Summary: ?gout/njr   Vital Signs:  Patient profile:   76 year old male Weight:      175 pounds O2 Sat:      94 % Temp:     98.2 degrees F Pulse rate:   82 / minute BP sitting:   130 / 80  (left arm)  Vitals Entered By: Pura Spice, RN (November 10, 2009 11:43 AM) CC: gout rt foot  ? about insulin  bloos sugar this cam 116 Is Patient Diabetic? Yes Did you bring your meter with you today? Yes   History of Present Illness: Here for 2 days of swelling and pain in the right great toe. He brings in a list of his glucoises at home. His am fasting levels are excellent in the range of 90-110. However he gets from 130 to 160 during the daytime.   Allergies: 1)  ! Lisinopril  Past History:  Past Medical History: Reviewed history from 08/22/2009 and no changes required. Hypertension Hyperlipidemia Atrial fibrillation, sees Dr. Verdis Prime Benign prostatic hypertrophy, sees Dr. Vic Blackbird Coronary artery disease, S/P stent placements Gout Diabetes mellitus, type II ED  Review of Systems  The patient denies anorexia, fever, weight loss, weight gain, vision loss, decreased hearing, hoarseness, chest pain, syncope, dyspnea on exertion, peripheral edema, prolonged cough, headaches, hemoptysis, abdominal pain, melena, hematochezia, severe indigestion/heartburn, hematuria, incontinence, genital sores, muscle weakness, suspicious skin lesions, transient blindness, difficulty walking, depression, unusual weight change, abnormal bleeding, enlarged lymph nodes, angioedema, breast masses, and testicular masses.    Physical Exam  General:  Well-developed,well-nourished,in no acute distress; alert,appropriate and cooperative throughout examination Lungs:  Normal respiratory effort, chest expands symmetrically. Lungs are clear to auscultation, no crackles or wheezes. Heart:  Normal rate and regular rhythm. S1 and S2 normal without gallop, murmur, click, rub or other extra  sounds. Extremities:  the right great toe is swollen and tender at the MTP   Impression & Recommendations:  Problem # 1:  GOUT (ICD-274.9)  Orders: Depo- Medrol 40mg  (J1030) Depo- Medrol 80mg  (J1040) Admin of Therapeutic Inj  intramuscular or subcutaneous (28413)  Problem # 2:  DIABETES MELLITUS, TYPE II (ICD-250.00)  His updated medication list for this problem includes:    Losartan Potassium 100 Mg Tabs (Losartan potassium) ..... Once daily    Lantus Solostar 100 Unit/ml Soln (Insulin glargine) .Marland Kitchen... 20 units at bedtime    Novolog Mix 70/30 Flexpen 70-30 % Susp (Insulin aspart prot & aspart) .Marland Kitchen... Give 10  units in am and 20 units in pm  Complete Medication List: 1)  Coumadin 5 Mg Tabs (Warfarin sodium) .... Sun, t,th 2)  Coumadin 2.5 Mg Tabs (Warfarin sodium) .... M,w,f,sat 3)  Lasix 40 Mg Tabs (Furosemide) .... 1/2 tab  by mouth once daily 4)  Amlodipine Besylate 10 Mg Tabs (Amlodipine besylate) .... Once daily 5)  Accu-chek Aviva Strp (Glucose blood) .... Test two times a day 6)  Neurontin 300 Mg Caps (Gabapentin) .... Two times a day 7)  Losartan Potassium 100 Mg Tabs (Losartan potassium) .... Once daily 8)  Metoprolol Tartrate 25 Mg Tabs (Metoprolol tartrate) .Marland Kitchen.. 1 two times a day 9)  Crestor 40 Mg Tabs (Rosuvastatin calcium) .Marland Kitchen.. 1 once daily 10)  Lantus Solostar 100 Unit/ml Soln (Insulin glargine) .... 20 units at bedtime 11)  Novolog Mix 70/30 Flexpen 70-30 % Susp (Insulin aspart prot & aspart) .... Give 10  units in am and 20 units in pm 12)  Pen Needles 31g X 6 Mm Misc (  Insulin pen needle) .... Uad 13)  Temazepam 30 Mg Caps (Temazepam) .Marland Kitchen.. 1 at bedtime  Patient Instructions: 1)  Given a steroid shot. Will increase his Novolog doses as above   Medication Administration  Injection # 1:    Medication: Depo- Medrol 40mg     Diagnosis: GOUT (ICD-274.9)    Route: IM    Site: RUOQ gluteus    Exp Date: 06/2012    Lot #: [id number]    Mfr: Pharmacia    Patient  tolerated injection without complications    Given by: christy freeman,cma  Injection # 2:    Medication: Depo- Medrol 80mg     Diagnosis: GOUT (ICD-274.9)    Route: IM    Site: RUOQ gluteus    Exp Date: 06/2012    Lot #: [id number]    Mfr: Pharmacia    Patient tolerated injection without complications    Given by: christy freeman,cma  Orders Added: 1)  Depo- Medrol 40mg  [J1030] 2)  Depo- Medrol 80mg  [J1040] 3)  Admin of Therapeutic Inj  intramuscular or subcutaneous [96372] 4)  Est. Patient Level IV [87564]

## 2010-04-03 NOTE — Progress Notes (Signed)
Summary: Pt is in hospital in [place]. Suppose to be discharged today.   Phone Note Call from Patient Call back at [phone] Dawn   Caller: Daughter - Sarajane Marek Summary of Call: Pt is in hospital in [place] and is suppose to be discharged today, but hospital is recommeded to get MRI on kidneys. Dr Allena Katz is suppose to be attempting to contact Dr. Clent Ridges to discuss. Pts daughter feels that her dad should not be discharged yet.  [facility] in [place], [place] telephone # [phone].       Initial call taken by: Lucy Antigua,  October 30, 2009 2:57 PM  Follow-up for Phone Call        the decision as to whether to discharge him is up to his doctor there, it is not my place to give them advice. I  will follow up here and order any tests that they recommend Follow-up by: Nelwyn Salisbury MD,  October 30, 2009 4:31 PM  Additional Follow-up for Phone Call Additional follow up Details #1::        I called pts daughter, Alvis Lemmings, and notified her with the info noted above. She said that pt has been sch a follow up appt with Dr. Clent Ridges for Wed 11/01/09 at 10:30am.   Additional Follow-up by: Lucy Antigua,  October 30, 2009 4:56 PM

## 2010-04-03 NOTE — Progress Notes (Signed)
Summary: LMTCB 10-14  Phone Note Call from Patient Call back at [phone] cell   Caller: Patient Summary of Call: Pt called and said that Lantus has been discontinued and pt has 4 lft. Pt is wondering if he can continue to use the rest on his Lantus, until his Novolog can be refilled?  Initial call taken by: Lucy Antigua,  December 14, 2009 1:12 PM  Follow-up for Phone Call        No, we will not use Lantus any more. Stick with Novolog Mix only Follow-up by: Nelwyn Salisbury MD,  December 15, 2009 8:23 AM  Additional Follow-up for Phone Call Additional follow up Details #1::        LMTCB Rudy Jew, RN  December 15, 2009 11:43 AM

## 2010-04-03 NOTE — Progress Notes (Signed)
Summary: Symptoms  Phone Note Call from Patient   Summary of Call: Last night 11pm soaking wet.  Felt weak, had chocolate & OJ.  Didn't check BS.  This am FBS 60.  Had pacer workup this am.  Novolog 70/30 10 units am & 20 pm both ac & with BS checks.  pm BS 137 & FBS yest 105, he thinks.  88-105 ams 125-150 pms.  No diet changes.  Rash on stomach last night before he had perspiration episode.  Thought it might be gout.  Cortisone cream on it.  Gone this am.  Temp fine.  Last month had UTI & pneumonia.  Sat got pain on side where it happened before.  Trying to decide if kidney or hip pain.  [facility]  NKDA.   ov 10:30.   Initial call taken by: Rudy Jew, RN,  December 18, 2009 9:40 AM

## 2010-04-03 NOTE — Assessment & Plan Note (Signed)
Summary: rash/dm   Vital Signs:  Patient profile:   76 year old male Weight:      186 pounds Temp:     98.4 degrees F oral BP sitting:   160 / 100  (left arm) Cuff size:   regular  Vitals Entered By: Raechel Ache, RN (Jul 25, 2009 11:13 AM) CC: Saw Dr burchette yesterday, still has itchy & sore rash all over.   History of Present Illness: Here for the sudden onset 2 days ago of an itchy rash  over the trunk. He is on no new meds, has not eaten any new foods, etc. He has been on Lisinopril for about 2 years. No lip swelling or SOB. He was seen yesterday here, and he was told to use Allegra and Ranitidine, which he has been doing.   Allergies (verified): 1)  ! Lisinopril  Past History:  Past Medical History: Reviewed history from 01/24/2009 and no changes required. Hypertension Hyperlipidemia Atrial fibrillation, sees Dr. Verdis Prime Benign prostatic hypertrophy Coronary artery disease, S/P stent placements Gout Diabetes mellitus, type II  Review of Systems  The patient denies anorexia, fever, weight loss, weight gain, vision loss, decreased hearing, hoarseness, chest pain, syncope, dyspnea on exertion, peripheral edema, prolonged cough, headaches, hemoptysis, abdominal pain, melena, hematochezia, severe indigestion/heartburn, hematuria, incontinence, genital sores, muscle weakness, suspicious skin lesions, transient blindness, difficulty walking, depression, unusual weight change, abnormal bleeding, enlarged lymph nodes, angioedema, breast masses, and testicular masses.    Physical Exam  General:  Well-developed,well-nourished,in no acute distress; alert,appropriate and cooperative throughout examination Neck:  No deformities, masses, or tenderness noted. Lungs:  Normal respiratory effort, chest expands symmetrically. Lungs are clear to auscultation, no crackles or wheezes. Heart:  Normal rate and regular rhythm. S1 and S2 normal without gallop, murmur, click, rub or other  extra sounds. Skin:  urticaria all over the trunk    Impression & Recommendations:  Problem # 1:  URTICARIA, IDIOPATHIC (ICD-708.1)  Problem # 2:  HYPERTENSION (ICD-401.9)  The following medications were removed from the medication list:    Lisinopril 20 Mg Tabs (Lisinopril) .Marland Kitchen..Marland Kitchen Two times a day His updated medication list for this problem includes:    Lasix 40 Mg Tabs (Furosemide) .Marland Kitchen... 1 by mouth once daily    Amlodipine Besylate 10 Mg Tabs (Amlodipine besylate) ..... Once daily    Losartan Potassium 100 Mg Tabs (Losartan potassium) ..... Once daily  Complete Medication List: 1)  Coumadin 5 Mg Tabs (Warfarin sodium) .... Sun, t,th 2)  Coumadin 2.5 Mg Tabs (Warfarin sodium) .... M,w,f,sat 3)  Lasix 40 Mg Tabs (Furosemide) .Marland Kitchen.. 1 by mouth once daily 4)  Klor-con M20 20 Meq Tbcr (Potassium chloride crys cr) .Marland Kitchen.. 1 by mouth qd 5)  Amlodipine Besylate 10 Mg Tabs (Amlodipine besylate) .... Once daily 6)  Metformin Hcl 500 Mg Tabs (Metformin hcl) .... Once daily 7)  Accu-chek Aviva Strp (Glucose blood) .... Test two times a day 8)  Neurontin 300 Mg Caps (Gabapentin) .... Two times a day 9)  Losartan Potassium 100 Mg Tabs (Losartan potassium) .... Once daily 10)  Prednisone (pak) 10 Mg Tabs (Prednisone) .... As directed for 12 days  Patient Instructions: 1)  This could be related to his ACE inhibitor. Switch to Losartan, and recheck in one month. Prescriptions: PREDNISONE (PAK) 10 MG TABS (PREDNISONE) as directed for 12 days  #1 x 0   Entered and Authorized by:   Nelwyn Salisbury MD   Signed by:   Nelwyn Salisbury  MD on 07/25/2009   Method used:   Electronically to        [facility] [phone][facility] (retail)       [address]       [place], [place]  [address]       Ph: [phone] or [phone]       Fax: [phone]   RxID:   [id number] LOSARTAN POTASSIUM 100 MG TABS (LOSARTAN POTASSIUM) once daily  #30 x 11   Entered and Authorized by:   Nelwyn Salisbury MD   Signed by:   Nelwyn Salisbury  MD on 07/25/2009   Method used:   Electronically to        [facility] [phone]* (retail)       [address]       [place], [place]  [address]       Ph: [phone] or [phone]       Fax: [phone]   RxID:   [id number] METFORMIN HCL 500 MG TABS (METFORMIN HCL) once daily  #30 x 11   Entered and Authorized by:   Nelwyn Salisbury MD   Signed by:   Nelwyn Salisbury MD on 07/25/2009   Method used:   Electronically to        [facility] [phone]* (retail)       [address]       [place], [place]  [address]       Ph: [phone] or [phone]       Fax: [phone]   RxID:   [id number] KLOR-CON M20 20 MEQ  TBCR (POTASSIUM CHLORIDE CRYS CR) 1 by mouth qd  #30 x 11   Entered and Authorized by:   Nelwyn Salisbury MD   Signed by:   Nelwyn Salisbury MD on 07/25/2009   Method used:   Electronically to        [facility] [phone]* (retail)       [address]       [place], [place]  [address]       Ph: [phone] or [phone]       Fax: [phone]   RxID:   [id number] AMLODIPINE BESYLATE 10 MG  TABS (AMLODIPINE BESYLATE) once daily  #30 x 11   Entered and Authorized by:   Nelwyn Salisbury MD   Signed by:   Nelwyn Salisbury MD on 07/25/2009   Method used:   Electronically to        [facility] [phone]* (retail)       [address]       [place], [place]  [address]       Ph: [phone] or [phone]       Fax: [phone]   RxID:   [id number] LASIX 40 MG TABS (FUROSEMIDE) 1 by mouth once daily  #30 x 11   Entered and Authorized by:   Nelwyn Salisbury MD   Signed by:   Nelwyn Salisbury MD on 07/25/2009   Method used:   Electronically to        [facility] [phone]* (retail)       [address]       [place], [place]  [address]       Ph: [phone] or [phone]       Fax: [phone]   RxID:   [id number]

## 2010-04-03 NOTE — Letter (Signed)
Summary: [facility]  [facility]   Imported By: Maryln Gottron 08/21/2009 12:45:36  _____________________________________________________________________  External Attachment:    Type:   Image     Comment:   External Document

## 2010-04-03 NOTE — Letter (Signed)
Summary: [facility]  [facility]   Imported By: Maryln Gottron 12/14/2009 09:55:35  _____________________________________________________________________  External Attachment:    Type:   Image     Comment:   External Document

## 2010-04-05 ENCOUNTER — Ambulatory Visit (INDEPENDENT_AMBULATORY_CARE_PROVIDER_SITE_OTHER): Payer: Medicare (Managed Care) | Admitting: Endocrinology

## 2010-04-05 ENCOUNTER — Encounter: Payer: Self-pay | Admitting: Endocrinology

## 2010-04-05 DIAGNOSIS — E119 Type 2 diabetes mellitus without complications: Secondary | ICD-10-CM

## 2010-04-05 NOTE — Progress Notes (Signed)
Summary: speak to [facility] and referral request  Phone Note Call from Patient   Caller: Daughter  Dawn Call For: Jonathan Salisbury MD Summary of Call: Pt's daughter wants to speak to Almira Coaster about his recent office visit, and they also want a Endocrinology referral. [phone] Initial call taken by: [facility] CMA AAMA,  March 15, 2010 2:44 PM  Follow-up for Phone Call        refer him to Dr. Everardo All for type 2 DM   Follow-up by: Jonathan Salisbury MD,  March 16, 2010 10:47 AM  Additional Follow-up for Phone Call Additional follow up Details #1::        left message to call back Additional Follow-up by: Kyung Rudd, CMA,  March 16, 2010 3:02 PM    Additional Follow-up for Phone Call Additional follow up Details #2::    pt's daughter is aware Follow-up by: Kyung Rudd, CMA,  March 16, 2010 3:05 PM   Appended Document: referral to Dr Everardo All      Clinical Lists Changes  Orders: Added new Referral order of Endocrinology Referral (Endocrine) - Signed

## 2010-04-05 NOTE — Letter (Signed)
Summary: [facility]  [facility]   Imported By: Maryln Gottron 03/19/2010 13:01:37  _____________________________________________________________________  External Attachment:    Type:   Image     Comment:   External Document

## 2010-04-05 NOTE — Assessment & Plan Note (Signed)
Summary: tremors//ccm   Vital Signs:  Patient profile:   76 year old male Weight:      190 pounds Pulse rate:   92 / minute BP sitting:   120 / 78  (left arm)  Vitals Entered By: Kyung Rudd, CMA (March 15, 2010 2:55 PM) CC: pt c/o nerve tremors in both hands that started today   CC:  pt c/o nerve tremors in both hands that started today.  History of Present Illness: Patient presents to clinic as a workin for evaluation of hand tics. Notes several day h/o bilateral hand involuntary movements. No other body areas involved. Sx's happen repeatedly throughout the day without trigger. Denies focal neurologic deficit including extremity weakness, difficulty with speech or lack of coordination. States recently began neurotin 300mg  three times a day and symptoms began possibly the next day. Symptoms are not worsening and there are no obvious aleviating or exacerbating factors. H/o  DM with last visit reviewed related to hypoglycemia. Insulin dosing reduced at that time. Pt denies hypoglycemic symptoms however admits is not checking fsbs regularly. Checks  ~ every third day as he has no symptoms of hyperglycemia.  Current Medications (verified): 1)  Coumadin 5 Mg Tabs (Warfarin Sodium) .... Sun, T,th 2)  Coumadin 2.5 Mg  Tabs (Warfarin Sodium) .... M,w,f,sat 3)  Lasix 40 Mg Tabs (Furosemide) .Marland Kitchen.. 1 Mouth Once Daily 4)  Amlodipine Besylate 10 Mg  Tabs (Amlodipine Besylate) .... 1/2 Once Daily 5)  Accu-Chek Aviva  Strp (Glucose Blood) .... Test Two Times A Day 6)  Losartan Potassium 100 Mg Tabs (Losartan Potassium) .... Once Daily 7)  Metoprolol Tartrate 25 Mg Tabs (Metoprolol Tartrate) .Marland Kitchen.. 1 Two Times A Day 8)  Novolog Mix 70/30 Flexpen 70-30 % Susp (Insulin Aspart Prot & Aspart) .... 6 Units in The Am and 10 in The Pm 9)  Pen Needles 31g X 6 Mm Misc (Insulin Pen Needle) .... Uad 10)  Hydrocodone-Acetaminophen 7.5-325 Mg Tabs (Hydrocodone-Acetaminophen) 11)  Finasteride 5 Mg Tabs  (Finasteride) 12)  Ultravate 0.05 % Crea (Halobetasol Propionate) .... Apply Two Times A Day As Needed 13)  Neurontin 300 Mg Caps (Gabapentin) .... Three Times A Day  Allergies (verified): 1)  ! Lisinopril  Past History:  Past medical, surgical, family and social histories (including risk factors) reviewed, and no changes noted (except as noted below).  Past Medical History: Reviewed history from 08/22/2009 and no changes required. Hypertension Hyperlipidemia Atrial fibrillation, sees Dr. Verdis Prime Benign prostatic hypertrophy, sees Dr. Vic Blackbird Coronary artery disease, S/P stent placements Gout Diabetes mellitus, type II ED  Past Surgical History: Reviewed history from 02/17/2009 and no changes required. TURP Procedure had several ESI to the neck per Dr. Ethelene Hal steroid injections to both heels per Dr. Lestine Box  Family History: Reviewed history and no changes required.  Social History: Reviewed history from 01/23/2007 and no changes required. Retired Current Smoker (cigar) Alcohol use-yes Drug use-no  Review of Systems General:  Denies chills and fever. Eyes:  Denies blurring, double vision, vision loss-1 eye, and vision loss-both eyes. Neuro:  Complains of numbness and tremors; denies brief paralysis, disturbances in coordination, falling down, headaches, inability to speak, poor balance, tingling, visual disturbances, and weakness; numbness related to h/o peripheal neuropathy per pt.  Physical Exam  General:  Well-developed,well-nourished,in no acute distress; alert,appropriate and cooperative throughout examination Head:  Normocephalic and atraumatic without obvious abnormalities. No apparent alopecia or balding. Eyes:  vision grossly intact, pupils equal, pupils round, pupils reactive to light, pupils  react to accomodation, corneas and lenses clear, and no injection.   Ears:  no external deformities.   Nose:  no external deformity.   Neck:  No  deformities, masses, or tenderness noted. Lungs:  Normal respiratory effort, chest expands symmetrically. Lungs are clear to auscultation, no crackles or wheezes. Heart:  Normal rate and regular rhythm. S1 and S2 normal without gallop, murmur, click, rub or other extra sounds. Neurologic:  alert & oriented X3, cranial nerves II-XII intact, strength normal in all extremities, gait normal, and finger-to-nose normal.     Impression & Recommendations:  Problem # 1:  ABNORMAL INVOLUNTARY MOVEMENTS (ICD-781.0) Assessment New Given time course suspect medication effect with neurontin. Neurologically nonfocal. Recommend DC of neurontin and observe for resolution of symptoms. Followup if no improvement or worsening.  Problem # 2:  DIABETES MELLITUS, TYPE II (ICD-250.00) Assessment: Unchanged Discussed need for close monitoring of FSBS with insulin use to avoid hyper or hypoglycemia. States understanding.  Complete Medication List: 1)  Coumadin 5 Mg Tabs (Warfarin sodium) .... Sun, t,th 2)  Coumadin 2.5 Mg Tabs (Warfarin sodium) .... M,w,f,sat 3)  Lasix 40 Mg Tabs (Furosemide) .Marland Kitchen.. 1 mouth once daily 4)  Amlodipine Besylate 10 Mg Tabs (Amlodipine besylate) .... 1/2 once daily 5)  Accu-chek Aviva Strp (Glucose blood) .... Test two times a day 6)  Losartan Potassium 100 Mg Tabs (Losartan potassium) .... Once daily 7)  Metoprolol Tartrate 25 Mg Tabs (Metoprolol tartrate) .Marland Kitchen.. 1 two times a day 8)  Novolog Mix 70/30 Flexpen 70-30 % Susp (Insulin aspart prot & aspart) .... 6 units in the am and 10 in the pm 9)  Pen Needles 31g X 6 Mm Misc (Insulin pen needle) .... Uad 10)  Hydrocodone-acetaminophen 7.5-325 Mg Tabs (Hydrocodone-acetaminophen) 11)  Finasteride 5 Mg Tabs (Finasteride) 12)  Ultravate 0.05 % Crea (Halobetasol propionate) .... Apply two times a day as needed 13)  Neurontin 300 Mg Caps (Gabapentin) .... Three times a day   Orders Added: 1)  Est. Patient Level IV [[address]]

## 2010-04-05 NOTE — Progress Notes (Signed)
Summary: Pt needs all new script written and sent to [facility]  Phone Note Call from Patient Call back at Home Phone [phone] Call back at [phone] cell tomorrow   Caller: Patient Summary of Call: Pt has changed mail order pharmacys to [facility] and needs to get all new script written for Metoprolol, Losartan,Furosemide,Finasteride, Warfarin. Pls call these in to [facility] [phone] Initial call taken by: Lucy Antigua,  March 20, 2010 4:34 PM  Follow-up for Phone Call        please call in one year supplies of each  Follow-up by: Nelwyn Salisbury MD,  March 21, 2010 10:54 AM    Prescriptions: METOPROLOL TARTRATE 25 MG TABS (METOPROLOL TARTRATE) 1 two times a day  #180 x 3   Entered by:   Duard Brady LPN   Authorized by:   Nelwyn Salisbury MD   Signed by:   Duard Brady LPN on 10/62/6948   Method used:   Faxed to ...       [facility] (mail-order)             , [place]         Ph: [phone]       Fax: [phone]   RxID:   [id number] LOSARTAN POTASSIUM 100 MG TABS (LOSARTAN POTASSIUM) once daily  #90 x 3   Entered by:   Duard Brady LPN   Authorized by:   Nelwyn Salisbury MD   Signed by:   Duard Brady LPN on 25/85/2778   Method used:   Faxed to ...       [facility] (mail-order)             , [place]         Ph: [phone]       Fax: [phone]   RxID:   [id number] LASIX 40 MG TABS (FUROSEMIDE) 1 mouth once daily  #90 x 3   Entered by:   Duard Brady LPN   Authorized by:   Nelwyn Salisbury MD   Signed by:   Duard Brady LPN on 98/33/8250   Method used:   Faxed to ...       [facility] (mail-order)             , [place]         Ph: [phone]       Fax: [phone]   RxID:   [id number] COUMADIN 2.5 MG  TABS (WARFARIN SODIUM) m,w,f,sat  #60 x 3   Entered by:   Duard Brady LPN   Authorized by:   Nelwyn Salisbury MD   Signed by:   Duard Brady LPN on 22/97/9892   Method used:   Faxed to ...       [facility] (mail-order)           , [place]         Ph: [phone]       Fax: [phone]   RxID:   [id number] COUMADIN 5 MG TABS (WARFARIN SODIUM) sun, t,th  #45 x 3   Entered by:   Duard Brady LPN   Authorized by:   Nelwyn Salisbury MD   Signed by:   Duard Brady LPN on 41/28/7867   Method used:   Faxed to ...       [facility] (mail-order)             , [place]         Ph: [phone]       Fax: [phone]  RxID:   [id number]

## 2010-04-05 NOTE — Assessment & Plan Note (Signed)
Summary: diarrhea/weight gain/sweats/weak/ok per Dr. Karalee Height   Vital Signs:  Patient profile:   76 year old male Weight:      190 pounds O2 Sat:      98 % Temp:     97.9 degrees F Pulse rate:   103 / minute BP sitting:   120 / 80  (left arm) Cuff size:   regular  Vitals Entered By: Pura Spice, RN (March 12, 2010 11:32 AM) CC: saw Dr Katrinka Blazing this am . requeting labs astates had pancakes with syrup this am. has 1-2 soft loose stools per day and "perspires alot"   History of Present Illness: here to follow up on DM. His am fasting glucoses are usually in the range of 110 to 130, and they run about the same in the evenings. However during the  midday they oftn drop into the 70s or 80s, and this causes him to feel weka and shaky and sweaty. He also has had worsening tingling an burning in the feet for several months. he tried Neurontin briefly last year but stopped it quickly.   Allergies: 1)  ! Lisinopril  Past History:  Past Medical History: Reviewed history from 08/22/2009 and no changes required. Hypertension Hyperlipidemia Atrial fibrillation, sees Dr. Verdis Prime Benign prostatic hypertrophy, sees Dr. Vic Blackbird Coronary artery disease, S/P stent placements Gout Diabetes mellitus, type II ED  Review of Systems  The patient denies anorexia, fever, weight loss, weight gain, vision loss, decreased hearing, hoarseness, chest pain, syncope, dyspnea on exertion, peripheral edema, prolonged cough, headaches, hemoptysis, abdominal pain, melena, hematochezia, severe indigestion/heartburn, hematuria, incontinence, genital sores, muscle weakness, suspicious skin lesions, transient blindness, difficulty walking, depression, unusual weight change, abnormal bleeding, enlarged lymph nodes, angioedema, breast masses, and testicular masses.    Physical Exam  General:  Well-developed,well-nourished,in no acute distress; alert,appropriate and cooperative throughout  examination Lungs:  Normal respiratory effort, chest expands symmetrically. Lungs are clear to auscultation, no crackles or wheezes. Heart:  Normal rate and regular rhythm. S1 and S2 normal without gallop, murmur, click, rub or other extra sounds.   Impression & Recommendations:  Problem # 1:  DIABETES MELLITUS, TYPE II (ICD-250.00)  His updated medication list for this problem includes:    Losartan Potassium 100 Mg Tabs (Losartan potassium) ..... Once daily    Novolog Mix 70/30 Flexpen 70-30 % Susp (Insulin aspart prot & aspart) .Marland KitchenMarland KitchenMarland KitchenMarland Kitchen 6 units in the am and 10 in the pm  Problem # 2:  PERIPHERAL NEUROPATHY (ICD-356.9)  Complete Medication List: 1)  Coumadin 5 Mg Tabs (Warfarin sodium) .... Sun, t,th 2)  Coumadin 2.5 Mg Tabs (Warfarin sodium) .... M,w,f,sat 3)  Lasix 40 Mg Tabs (Furosemide) .Marland Kitchen.. 1 mouth once daily 4)  Amlodipine Besylate 10 Mg Tabs (Amlodipine besylate) .... 1/2 once daily 5)  Accu-chek Aviva Strp (Glucose blood) .... Test two times a day 6)  Losartan Potassium 100 Mg Tabs (Losartan potassium) .... Once daily 7)  Metoprolol Tartrate 25 Mg Tabs (Metoprolol tartrate) .Marland Kitchen.. 1 two times a day 8)  Novolog Mix 70/30 Flexpen 70-30 % Susp (Insulin aspart prot & aspart) .... 6 units in the am and 10 in the pm 9)  Pen Needles 31g X 6 Mm Misc (Insulin pen needle) .... Uad 10)  Hydrocodone-acetaminophen 7.5-325 Mg Tabs (Hydrocodone-acetaminophen) 11)  Finasteride 5 Mg Tabs (Finasteride) 12)  Ultravate 0.05 % Crea (Halobetasol propionate) .... Apply two times a day as needed 13)  Neurontin 300 Mg Caps (Gabapentin) .... Three times a day  Patient Instructions: 1)  Adjust the am insulin dose down as above. Get back on Neurontin, and add a B complex vitamin daily.  2)  Please schedule a follow-up appointment in 1 month.  Prescriptions: NEURONTIN 300 MG CAPS (GABAPENTIN) three times a day  #90 x 5   Entered and Authorized by:   Nelwyn Salisbury MD   Signed by:   Nelwyn Salisbury MD on  03/12/2010   Method used:   Electronically to        [facility] [phone]* (retail)       [address]       [place], [place]  [address]       Ph: [phone] or [phone]       Fax: [phone]   RxID:   [id number]    Orders Added: 1)  Est. Patient Level IV [36644]

## 2010-04-11 NOTE — Letter (Signed)
Summary: No Show for Appt./Nutrition & Diabetes Management  No Show for Appt./Nutrition & Diabetes Management   Imported By: Maryln Gottron 04/02/2010 15:16:09  _____________________________________________________________________  External Attachment:    Type:   Image     Comment:   External Document

## 2010-04-19 NOTE — Assessment & Plan Note (Signed)
Summary: NEW ENDO DM II/MEDICARE RS FROM 04/02/10-REF PHY/DR FRY---STC   Vital Signs:  Patient profile:   76 year old male Height:      68 inches (172.72 cm) Weight:      195.50 pounds (88.86 kg) BMI:     29.83 O2 Sat:      98 % on Room air Temp:     98.3 degrees F (36.83 degrees C) oral Pulse rate:   73 / minute Pulse rhythm:   regular BP sitting:   110 / 78  (left arm) Cuff size:   regular  Vitals Entered By: Brenton Grills CMA Duncan Dull) (April 05, 2010 11:17 AM)  O2 Flow:  Room air  CC: New Endo Consult/DMII/Dr. Delane Ginger Is Patient Diabetic? Yes   Referring Provider:  Nelwyn Salisbury MD Primary Provider:  Nelwyn Salisbury MD  CC:  New Endo Consult/DMII/Dr. Delane Ginger.  History of Present Illness: pt states 1 year h/o dm.  he has several chronic complications.  he has been on insulin x soon after dx, due to the failure of a combination od several oral agents.   he takes novolog 70/30, 10 units am, and 6 units pm.   pt says his diet and exercise are "fair."   symptomatically, pt states intermittent episodes of few minutes of moderate diaphoresis of the skin, and assoc generalized weakness.  on 1 of these occasions, he checked cbg, and it was 81.     Current Medications (verified): 1)  Coumadin 5 Mg Tabs (Warfarin Sodium) .... Sun, T,th 2)  Coumadin 2.5 Mg  Tabs (Warfarin Sodium) .... M,w,f,sat 3)  Lasix 40 Mg Tabs (Furosemide) .Marland Kitchen.. 1 Mouth Once Daily 4)  Amlodipine Besylate 10 Mg  Tabs (Amlodipine Besylate) .Marland Kitchen.. 1 Tablet By Mouth Once Daily 5)  Accu-Chek Aviva  Strp (Glucose Blood) .... Test Two Times A Day 6)  Losartan Potassium 100 Mg Tabs (Losartan Potassium) .... Once Daily 7)  Metoprolol Tartrate 25 Mg Tabs (Metoprolol Tartrate) .Marland Kitchen.. 1 Two Times A Day 8)  Novolog Mix 70/30 Flexpen 70-30 % Susp (Insulin Aspart Prot & Aspart) .Marland Kitchen.. 10 Units in The Am and 6 in The Pm 9)  Pen Needles 31g X 6 Mm Misc (Insulin Pen Needle) .... Uad 10)  Hydrocodone-Acetaminophen 7.5-325 Mg Tabs  (Hydrocodone-Acetaminophen) 11)  Finasteride 5 Mg Tabs (Finasteride) 12)  Ultravate 0.05 % Crea (Halobetasol Propionate) .... Apply Two Times A Day As Needed 13)  B-100  Tabs (Vitamins-Lipotropics) .Marland Kitchen.. 1 Tablet By Mouth Once Daily  Allergies (verified): 1)  ! Lisinopril  Past History:  Past Medical History: Last updated: 08/22/2009 Hypertension Hyperlipidemia Atrial fibrillation, sees Dr. Verdis Prime Benign prostatic hypertrophy, sees Dr. Vic Blackbird Coronary artery disease, S/P stent placements Gout Diabetes mellitus, type II ED  Family History: Reviewed history and no changes required. Family History Breast cancer 1st degree relative <50 Family History Of Heart Disease (Grandparent) no dm  Social History: Reviewed history from 01/23/2007 and no changes required. Retired Current Smoker (cigar) Alcohol use-yes Drug use-no widowed 1999.  Review of Systems       The patient complains of headaches and depression.         denies weight loss, chest pain, sob, n/v, urinary frequency, cramps, memory loss,  and erectile dysfunction.  he has intermittent blurry vision, easy bruising, and rhinorrhea.  he has 2 years of intermittent pain and numbness of the feet.  he did not tolerate neurontin.  Physical Exam  General:  Well developed, well nourished, in  no acute distress.  Head:  head: no deformity eyes: no periorbital swelling, no proptosis external nose and ears are normal mouth: no lesion seen Neck:  Supple without thyroid enlargement or tenderness.  Lungs:  Clear to auscultation bilaterally. Normal respiratory effort.  Heart:  Regular rate and rhythm without murmurs or gallops noted. Normal S1,S2.   Abdomen:  abdomen is soft, nontender.  no hepatosplenomegaly.   not distended.  no hernia  Msk:  muscle bulk and strength are grossly normal.  no obvious joint swelling.  gait is normal and steady  Pulses:  dorsalis pedis intact bilat.  no carotid  bruit Extremities:  no deformity.  no ulcer on the feet.  feet are of normal color and temp.  no edema mycotic toenails.   Neurologic:  cn 2-12 grossly intact.   readily moves all 4's.   sensation is intact to touch on the feet  Skin:  normal texture and temp.  no rash.  not diaphoretic  Cervical Nodes:  No significant adenopathy.  Psych:  Alert and cooperative; normal mood and affect; normal attention span and concentration.   Additional Exam:  Hemoglobin A1C       [H]  7.5 %    Diabetes Management Exam:    Foot Exam by Podiatrist:       Date: 03/04/2008       Done by: Tomasita Crumble   Impression & Recommendations:  Problem # 1:  DIABETES MELLITUS, TYPE II (ICD-250.00) this is the best control this pt should aim for, given this regimen, which does match insulin to his changing needs throughout the day  Problem # 2:  PERIPHERAL NEUROPATHY (ICD-356.9) with painful component  Problem # 3:  depression this limits the rx of #1  Medications Added to Medication List This Visit: 1)  Amlodipine Besylate 10 Mg Tabs (Amlodipine besylate) .Marland Kitchen.. 1 tablet by mouth once daily 2)  Novolog Mix 70/30 Flexpen 70-30 % Susp (Insulin aspart prot & aspart) .Marland Kitchen.. 10 units in the am and 6 in the pm 3)  B-100 Tabs (Vitamins-lipotropics) .Marland Kitchen.. 1 tablet by mouth once daily 4)  Morphine Sulfate Cr 15 Mg Xr12h-tab (Morphine sulfate) .Marland Kitchen.. 1 tab at bedtime  Other Orders: Consultation Level IV (16109)  Patient Instructions: 1)  good diet and exercise habits significanly improve the control of your diabetes.  please let me know if you wish to be referred to a dietician.  high blood sugar is very risky to your health.  you should see an eye doctor every year. 2)  controlling your blood pressure and cholesterol drastically reduces the damage diabetes does to your body.  this also applies to quitting smoking.  please discuss these with your doctor.  you should take an aspirin every day, unless you have been  advised by a doctor not to. 3)  check your blood sugar 2 times a day.  vary the time of day when you check, between before the 3 meals, and at bedtime.  also check if you have symptoms of your blood sugar being too high or too low.  please keep a record of the readings and bring it to your next appointment here.  please call us sooner if you are having low blood sugar episodes. 4)  we will need to take this complex situation in stages. 5)  add morphine-xr, 15 mg at bedtime. 6)  Please schedule a follow-up appointment in 2 weeks. Prescriptions: MORPHINE SULFATE CR 15 MG XR12H-TAB (MORPHINE SULFATE) 1 tab at bedtime  #  30 x 0   Entered and Authorized by:   Minus Breeding MD   Signed by:   Minus Breeding MD on 04/05/2010   Method used:   Print then Give to Patient   RxID:   [id number]    Orders Added: 1)  Consultation Level IV [14782]   Immunization History:  Tetanus/Td Immunization History:    Tetanus/Td:  historical (03/04/2008)  Pneumovax Immunization History:    Pneumovax:  pneumovax (medicare) (03/04/2009)   Immunization History:  Tetanus/Td Immunization History:    Tetanus/Td:  Historical (03/04/2008)  Pneumovax Immunization History:    Pneumovax:  Pneumovax (Medicare) (03/04/2009)

## 2010-04-24 ENCOUNTER — Other Ambulatory Visit: Payer: Self-pay | Admitting: Endocrinology

## 2010-04-24 ENCOUNTER — Other Ambulatory Visit: Payer: Medicare (Managed Care)

## 2010-04-24 ENCOUNTER — Encounter: Payer: Self-pay | Admitting: Endocrinology

## 2010-04-24 ENCOUNTER — Ambulatory Visit (INDEPENDENT_AMBULATORY_CARE_PROVIDER_SITE_OTHER): Payer: Medicare (Managed Care) | Admitting: Endocrinology

## 2010-04-24 ENCOUNTER — Encounter (INDEPENDENT_AMBULATORY_CARE_PROVIDER_SITE_OTHER): Payer: Self-pay | Admitting: *Deleted

## 2010-04-24 DIAGNOSIS — E119 Type 2 diabetes mellitus without complications: Secondary | ICD-10-CM

## 2010-04-24 LAB — HEMOGLOBIN A1C: Hgb A1c MFr Bld: 7.3 % — ABNORMAL HIGH (ref 4.6–6.5)

## 2010-05-01 NOTE — Assessment & Plan Note (Signed)
Summary: 2 WK FU  STC   Vital Signs:  Patient profile:   76 year old male Height:      68 inches (172.72 cm) Weight:      189.50 pounds (86.14 kg) BMI:     28.92 O2 Sat:      96 % on Room air Temp:     97.5 degrees F (36.39 degrees C) oral Pulse rate:   63 / minute Pulse rhythm:   regular BP sitting:   114 / 82  (left arm) Cuff size:   regular  Vitals Entered By: Brenton Grills CMA Duncan Dull) (April 24, 2010 9:23 AM)  O2 Flow:  Room air CC: 2 week F/U/pt is no longer taking Amlodipine/aj Is Patient Diabetic? Yes   Referring Provider:  Nelwyn Salisbury MD Primary Provider:  Nelwyn Salisbury MD  CC:  2 week F/U/pt is no longer taking Amlodipine/aj.  History of Present Illness: he brings a record of his cbg's which i have reviewed today.  it varies from 100-200 (but most are in the low-100's), with no trend throughout the day.   he says the ms-contin helps him sleep, but does not help with the pain.    Current Medications (verified): 1)  Coumadin 5 Mg Tabs (Warfarin Sodium) .... Sun, T,th 2)  Coumadin 2.5 Mg  Tabs (Warfarin Sodium) .... M,w,f,sat 3)  Lasix 40 Mg Tabs (Furosemide) .Marland Kitchen.. 1 Mouth Once Daily 4)  Amlodipine Besylate 10 Mg  Tabs (Amlodipine Besylate) .Marland Kitchen.. 1 Tablet By Mouth Once Daily 5)  Accu-Chek Aviva  Strp (Glucose Blood) .... Test Two Times A Day 6)  Losartan Potassium 100 Mg Tabs (Losartan Potassium) .... Once Daily 7)  Metoprolol Tartrate 25 Mg Tabs (Metoprolol Tartrate) .Marland Kitchen.. 1 Two Times A Day 8)  Novolog Mix 70/30 Flexpen 70-30 % Susp (Insulin Aspart Prot & Aspart) .Marland Kitchen.. 10 Units in The Am and 6 in The Pm 9)  Pen Needles 31g X 6 Mm Misc (Insulin Pen Needle) .... Uad 10)  Hydrocodone-Acetaminophen 7.5-325 Mg Tabs (Hydrocodone-Acetaminophen) 11)  Finasteride 5 Mg Tabs (Finasteride) 12)  Ultravate 0.05 % Crea (Halobetasol Propionate) .... Apply Two Times A Day As Needed 13)  B-100  Tabs (Vitamins-Lipotropics) .Marland Kitchen.. 1 Tablet By Mouth Once Daily 14)  Morphine Sulfate Cr  15 Mg Xr12h-Tab (Morphine Sulfate) .Marland Kitchen.. 1 Tab At Bedtime  Allergies (verified): 1)  ! Lisinopril  Past History:  Past Medical History: Last updated: 08/22/2009 Hypertension Hyperlipidemia Atrial fibrillation, sees Dr. Verdis Prime Benign prostatic hypertrophy, sees Dr. Vic Blackbird Coronary artery disease, S/P stent placements Gout Diabetes mellitus, type II ED  Review of Systems  The patient denies hypoglycemia.    Physical Exam  General:  Well developed, well nourished, in no acute distress.  Skin:  injection sites at the anterior abdomen are without lesions.     Impression & Recommendations:  Problem # 1:  DIABETES MELLITUS, TYPE II (ICD-250.00) we discussed options of continuing two times a day, vs three times a day insulin.  he chooses to continue the two times a day for now.    Problem # 2:  PERIPHERAL NEUROPATHY (ICD-356.9) painful.  needs increased rx  Medications Added to Medication List This Visit: 1)  Morphine Sulfate Cr 15 Mg Xr12h-tab (Morphine sulfate) .Marland Kitchen.. 1 tab at bedtime.  make available 05/03/10  Other Orders: TLB-A1C / Hgb A1C (Glycohemoglobin) (83036-A1C) Pain Clinic Referral (Pain) Est. Patient Level III (47829)  Patient Instructions: 1)  check your blood sugar 2 times a day.  vary the time of day when you check, between before the 3 meals, and at bedtime.  also check if you have symptoms of your blood sugar being too high or too low.  please keep a record of the readings and bring it to your next appointment here.  please call us sooner if you are having low blood sugar episodes. 2)  blood tests are being ordered for you today.  please call [phone] to hear your test results. 3)  pending the test results, please continue the same insulin for now. 4)  refer to a pain specialist.  you will be called with a day and time for an appointment. 5)  Please schedule a follow-up appointment in 3 months. Prescriptions: MORPHINE SULFATE CR 15 MG XR12H-TAB  (MORPHINE SULFATE) 1 tab at bedtime.  make available 05/03/10  #30 x 0   Entered and Authorized by:   Minus Breeding MD   Signed by:   Minus Breeding MD on 04/24/2010   Method used:   Print then Give to Patient   RxID:   [id number]    Orders Added: 1)  TLB-A1C / Hgb A1C (Glycohemoglobin) [83036-A1C] 2)  Pain Clinic Referral [Pain] 3)  Est. Patient Level III [62130]

## 2010-05-08 ENCOUNTER — Telehealth: Payer: Self-pay | Admitting: *Deleted

## 2010-05-08 NOTE — Telephone Encounter (Signed)
Pt is having a gout flare and would like Rx called in to

## 2010-05-09 ENCOUNTER — Telehealth: Payer: Self-pay | Admitting: Family Medicine

## 2010-05-09 MED ORDER — PREDNISONE (PAK) 10 MG PO TABS: 10.0000 mg | ORAL_TABLET | Freq: Every day | ORAL | Status: AC

## 2010-05-09 MED ORDER — ALLOPURINOL 100 MG PO TABS: 100.0000 mg | ORAL_TABLET | Freq: Every day | ORAL | Status: DC

## 2010-05-09 NOTE — Telephone Encounter (Signed)
done

## 2010-05-09 NOTE — Telephone Encounter (Signed)
Call in Allopurinol 100 mg qd , #30 with 11 rf

## 2010-05-09 NOTE — Telephone Encounter (Signed)
Triage vm---callled yesterday. A rx was sent to his pharmacy for gout. Was rx'd Allopurinol in the past. Please return his call.

## 2010-05-16 LAB — SURGICAL PCR SCREEN
MRSA, PCR: NEGATIVE
Staphylococcus aureus: NEGATIVE

## 2010-05-16 LAB — PROTIME-INR
INR: 1.1 (ref 0.00–1.49)
Prothrombin Time: 14.4 seconds (ref 11.6–15.2)

## 2010-05-16 LAB — APTT: aPTT: 25 seconds (ref 24–37)

## 2010-05-16 LAB — GLUCOSE, CAPILLARY
Glucose-Capillary: 110 mg/dL — ABNORMAL HIGH (ref 70–99)
Glucose-Capillary: 123 mg/dL — ABNORMAL HIGH (ref 70–99)

## 2010-05-18 ENCOUNTER — Encounter (INDEPENDENT_AMBULATORY_CARE_PROVIDER_SITE_OTHER): Payer: Self-pay | Admitting: *Deleted

## 2010-05-22 NOTE — Letter (Signed)
Summary: Appointment - Reminder 2  [facility], Main Office  [address]   [place], [place] [address]   Phone: [phone]  Fax: [phone]     May 18, 2010 MRN: [id number]   JOCOB DAMBACH [address] [place], [place]  [address]   Dear Mr. Herrle,  Our records indicate that it is time to schedule a follow-up appointment.  Dr.Allred recommended that you follow up with [place] in April. It is very important that we reach you to schedule this appointment. We look forward to participating in your health care needs. Please contact us at the number listed above at your earliest convenience to schedule your appointment.  If you are unable to make an appointment at this time, give [place] a call so we can update our records.     Sincerely,   Glass blower/designer

## 2010-05-23 LAB — CBC
Hemoglobin: 15.4 g/dL (ref 13.0–17.0)
MCHC: 34.9 g/dL (ref 30.0–36.0)
RBC: 4.87 MIL/uL (ref 4.22–5.81)

## 2010-05-23 LAB — DIFFERENTIAL
Basophils Absolute: 0 10*3/uL (ref 0.0–0.1)
Basophils Relative: 0 % (ref 0–1)
Lymphocytes Relative: 18 % (ref 12–46)
Monocytes Relative: 9 % (ref 3–12)
Neutro Abs: 5.1 10*3/uL (ref 1.7–7.7)
Neutrophils Relative %: 71 % (ref 43–77)

## 2010-05-23 LAB — POCT CARDIAC MARKERS
CKMB, poc: 3.2 ng/mL (ref 1.0–8.0)
Myoglobin, poc: 105 ng/mL (ref 12–200)
Troponin i, poc: <0.05 ng/mL (ref 0.00–0.09)

## 2010-05-23 LAB — BASIC METABOLIC PANEL
CO2: 21 mEq/L (ref 19–32)
Calcium: 8.9 mg/dL (ref 8.4–10.5)
Creatinine, Ser: 1.48 mg/dL (ref 0.4–1.5)
GFR calc Af Amer: 56 mL/min — ABNORMAL LOW (ref >60)
GFR calc non Af Amer: 47 mL/min — ABNORMAL LOW (ref >60)
Sodium: 137 mEq/L (ref 135–145)

## 2010-05-23 LAB — GLUCOSE, CAPILLARY: Glucose-Capillary: 136 mg/dL — ABNORMAL HIGH (ref 70–99)

## 2010-06-01 ENCOUNTER — Encounter: Payer: Self-pay | Admitting: Family Medicine

## 2010-06-01 ENCOUNTER — Ambulatory Visit (INDEPENDENT_AMBULATORY_CARE_PROVIDER_SITE_OTHER): Payer: Medicare (Managed Care) | Admitting: Family Medicine

## 2010-06-01 VITALS — BP 140/80 | HR 60 | Wt 191.0 lb

## 2010-06-01 DIAGNOSIS — L309 Dermatitis, unspecified: Secondary | ICD-10-CM

## 2010-06-01 DIAGNOSIS — E119 Type 2 diabetes mellitus without complications: Secondary | ICD-10-CM

## 2010-06-01 DIAGNOSIS — I10 Essential (primary) hypertension: Secondary | ICD-10-CM

## 2010-06-01 DIAGNOSIS — M549 Dorsalgia, unspecified: Secondary | ICD-10-CM

## 2010-06-01 DIAGNOSIS — L259 Unspecified contact dermatitis, unspecified cause: Secondary | ICD-10-CM

## 2010-06-01 LAB — POCT URINALYSIS DIPSTICK
Bilirubin, UA: NEGATIVE
Blood, UA: NEGATIVE
Spec Grav, UA: 1.02
pH, UA: 6.5

## 2010-06-01 NOTE — Progress Notes (Signed)
  Subjective:    Patient ID: [name], male    DOB: 02-19-1935, 76 y.o.   MRN: [id number]  HPI Here to follow up on multiple issues. His A1c last month was 7.3. He is working out at [employer] 3 days a week. He has not played golf in about 2 months. He feels well in general. Using the Halobetasol cream when his eczema flares up. He has had some back pains lately but no urinary symptoms. No SOB or chest pain.    Review of Systems  Constitutional: Negative.   Respiratory: Negative.   Cardiovascular: Negative.   Gastrointestinal: Negative.   Genitourinary: Negative.   Musculoskeletal: Positive for back pain.  Skin: Positive for rash.       Objective:   Physical Exam  Constitutional: He appears well-developed and well-nourished.  Cardiovascular: Normal rate, normal heart sounds and intact distal pulses.  An irregularly irregular rhythm present.  Pulmonary/Chest: Effort normal and breath sounds normal.          Assessment & Plan:  He seems to be doing well in general. We will set up fasting labs next week.

## 2010-06-04 ENCOUNTER — Other Ambulatory Visit (INDEPENDENT_AMBULATORY_CARE_PROVIDER_SITE_OTHER): Payer: Medicare (Managed Care) | Admitting: Family Medicine

## 2010-06-04 DIAGNOSIS — Z0289 Encounter for other administrative examinations: Secondary | ICD-10-CM

## 2010-06-04 DIAGNOSIS — E119 Type 2 diabetes mellitus without complications: Secondary | ICD-10-CM

## 2010-06-04 LAB — BASIC METABOLIC PANEL
BUN: 36 mg/dL — ABNORMAL HIGH (ref 6–23)
CO2: 26 mEq/L (ref 19–32)
Chloride: 100 mEq/L (ref 96–112)
Glucose, Bld: 198 mg/dL — ABNORMAL HIGH (ref 70–99)
Potassium: 4.7 mEq/L (ref 3.5–5.1)

## 2010-06-04 LAB — LIPID PANEL
Cholesterol: 267 mg/dL — ABNORMAL HIGH (ref 0–200)
Total CHOL/HDL Ratio: 6

## 2010-06-04 LAB — CBC WITH DIFFERENTIAL/PLATELET
Basophils Absolute: 0 10*3/uL (ref 0.0–0.1)
HCT: 46.7 % (ref 39.0–52.0)
Hemoglobin: 15.9 g/dL (ref 13.0–17.0)
Lymphs Abs: 1.3 10*3/uL (ref 0.7–4.0)
MCHC: 34 g/dL (ref 30.0–36.0)
MCV: 91.3 fl (ref 78.0–100.0)
Monocytes Relative: 9.7 % (ref 3.0–12.0)
Neutro Abs: 3.6 10*3/uL (ref 1.4–7.7)
RDW: 14.1 % (ref 11.5–14.6)

## 2010-06-04 LAB — HEPATIC FUNCTION PANEL
ALT: 24 U/L (ref 0–53)
AST: 26 U/L (ref 0–37)
Albumin: 3.7 g/dL (ref 3.5–5.2)
Total Protein: 6.1 g/dL (ref 6.0–8.3)

## 2010-06-05 ENCOUNTER — Telehealth: Payer: Self-pay

## 2010-06-05 MED ORDER — SIMVASTATIN 40 MG PO TABS: 40.0000 mg | ORAL_TABLET | Freq: Every evening | ORAL | Status: DC

## 2010-06-05 NOTE — Telephone Encounter (Signed)
Message copied by Madison Hickman on Tue Jun 05, 2010  3:33 PM ------      Message from: Dwaine Deter      Created: Tue Jun 05, 2010  8:53 AM       His diabetes seems to be stable, and his renal insufficiency is stable. His cholesterol has gone up though, so start on Simvastatin 40 mg a day. Call in one year supply, and recheck labs in 90 days

## 2010-06-05 NOTE — Telephone Encounter (Signed)
Pt aware and rx sent in [facility]

## 2010-06-15 ENCOUNTER — Ambulatory Visit: Payer: Medicare (Managed Care) | Admitting: Physical Medicine & Rehabilitation

## 2010-06-25 ENCOUNTER — Telehealth: Payer: Self-pay | Admitting: Internal Medicine

## 2010-06-25 NOTE — Telephone Encounter (Signed)
Per pt son call he doesn't think his Dad is feeling to well but the dad wont tell the son so he wants Dr. Johney Frame to call his Dad and talk to him

## 2010-06-26 NOTE — Telephone Encounter (Signed)
Spoke with pt and let him know Dr Johney Frame is in the office tomorrow  He has a follow up on 07/06/10 and Dr Katrinka Blazing saw him today  He has not been feeling well and Dr Katrinka Blazing saw him 06/26/10  Dr Katrinka Blazing is his primary cardiologist

## 2010-07-04 ENCOUNTER — Encounter: Payer: Self-pay | Admitting: Internal Medicine

## 2010-07-06 ENCOUNTER — Encounter: Payer: Self-pay | Admitting: Internal Medicine

## 2010-07-06 ENCOUNTER — Ambulatory Visit (INDEPENDENT_AMBULATORY_CARE_PROVIDER_SITE_OTHER): Payer: Medicare (Managed Care) | Admitting: Internal Medicine

## 2010-07-06 DIAGNOSIS — I428 Other cardiomyopathies: Secondary | ICD-10-CM

## 2010-07-06 DIAGNOSIS — I2510 Atherosclerotic heart disease of native coronary artery without angina pectoris: Secondary | ICD-10-CM

## 2010-07-06 DIAGNOSIS — I509 Heart failure, unspecified: Secondary | ICD-10-CM

## 2010-07-06 DIAGNOSIS — G609 Hereditary and idiopathic neuropathy, unspecified: Secondary | ICD-10-CM

## 2010-07-06 DIAGNOSIS — I5022 Chronic systolic (congestive) heart failure: Secondary | ICD-10-CM

## 2010-07-06 DIAGNOSIS — I4891 Unspecified atrial fibrillation: Secondary | ICD-10-CM

## 2010-07-06 NOTE — Assessment & Plan Note (Signed)
No ischemic symptoms No changes 

## 2010-07-06 NOTE — Assessment & Plan Note (Signed)
Persistent atrial fibrillation Continue coumadin for rate control

## 2010-07-06 NOTE — Assessment & Plan Note (Signed)
No CHF on exam.   His BiV ICD is functioning normally, though he is < 90 % biv paced.  I have increased his lower heart rate today to 70 bpm to promote resynchrony.  If he continues to have <90% BiV pacing, I will consider increasing beta blocker. I have also adjusted VT zone to 188 bpm to avoid inappropriate ICD shocks for afib with RVR.

## 2010-07-06 NOTE — Patient Instructions (Signed)
Your physician wants you to follow-up in: 12 months with Dr Allred You will receive a reminder letter in the mail two months in advance. If you don't receive a letter, please call our office to schedule the follow-up appointment.  

## 2010-07-06 NOTE — Assessment & Plan Note (Signed)
Stable

## 2010-07-06 NOTE — Progress Notes (Signed)
[name] is a pleasant 76 y.o. yo patient with a h/o miexed ischemic/ nonischemic CM, LBBB, NYHA Class III CHF sp BiV ICD (MDT) by Dr Amil Amen who presents today to establish care in the Electrophysiology device clinic.   The patient reports doing very well since having a BiV ICD implanted and remains reasonably active.   He reports ongoing fatigue and shortness of breath with moderate activity. Today, he  denies symptoms of palpitations, chest pain, orthopnea, PND, lower extremity edema, dizziness, presyncope, syncope, or neurologic sequela.  The patientis tolerating medications without difficulties and is otherwise without complaint today.   Past Medical History  Diagnosis Date  . Hyperlipidemia   . Hypertension   . Diabetes mellitus   . Gout   . ED (erectile dysfunction)   . CAD (coronary artery disease)   . Benign prostatic hypertrophy   . Atrial fibrillation   . Chronic systolic dysfunction of left ventricle     mixed ischemic and nonischemic CM,  EF 35%    Past Surgical History  Procedure Date  . Turp vaporization   . Cardiac defibrillator placement 12/26/09    History   Social History  . Marital Status: Widowed    Spouse Name: N/A    Number of Children: N/A  . Years of Education: N/A   Occupational History  . Retired    Social History Main Topics  . Smoking status: Former Smoker    Types: Cigars  . Smokeless tobacco: Not on file   Comment: remote  . Alcohol Use: Yes  . Drug Use: No  . Sexually Active: Not on file   Other Topics Concern  . Not on file   Social History Narrative   Lives in [place] with significant other,  [name].    Family History  Problem Relation Age of Onset  . Cancer      breast/fhx  . Heart disease      fhx  . Diabetes Neg Hx     Allergies  Allergen Reactions  . Lisinopril     REACTION: hives    Current Outpatient Prescriptions  Medication Sig Dispense Refill  . allopurinol (ZYLOPRIM) 100 MG tablet Take 1 tablet  (100 mg total) by mouth daily.  30 tablet  11  . amLODipine (NORVASC) 10 MG tablet Take 10 mg by mouth daily.        . finasteride (PROSCAR) 5 MG tablet Take 5 mg by mouth daily.        . furosemide (LASIX) 40 MG tablet Take 40 mg by mouth daily.        Marland Kitchen glucose blood test strip 1 each by Other route as needed. Use as instructed       . halobetasol (ULTRAVATE) 0.05 % cream Apply topically 2 (two) times daily.        Marland Kitchen HYDROcodone-acetaminophen (NORCO) 7.5-325 MG per tablet Take 1 tablet by mouth every 6 (six) hours as needed.        . insulin aspart protamine-insulin aspart (NOVOLOG 70/30) (70-30) 100 UNIT/ML injection Inject 6 Units into the skin 2 (two) times daily with a meal.       . Insulin Pen Needle (PEN NEEDLES) 31G X 6 MM MISC by Does not apply route.        Marland Kitchen losartan (COZAAR) 100 MG tablet Take 100 mg by mouth daily.        . metoprolol tartrate (LOPRESSOR) 25 MG tablet Take 25 mg by mouth 2 (two) times daily.        Marland Kitchen  morphine (MS CONTIN) 15 MG 12 hr tablet Take 15 mg by mouth 2 (two) times daily.        . simvastatin (ZOCOR) 40 MG tablet Take 1 tablet (40 mg total) by mouth every evening.  30 tablet  11  . Thiamine HCl (VITAMIN B-1) 100 MG tablet Take 100 mg by mouth daily.        . vitamin E 1000 UNIT capsule Take 1,000 Units by mouth daily.        Marland Kitchen warfarin (COUMADIN) 2.5 MG tablet Take 2.5 mg by mouth as directed.        . warfarin (COUMADIN) 5 MG tablet Take 5 mg by mouth as directed.          ROS- all systems are reviewed and negative except as per HPI  Physical Exam: There were no vitals filed for this visit.  GEN- The patient is well appearing, alert and oriented x 3 today.   Head- normocephalic, atraumatic Eyes-  Sclera clear, conjunctiva pink Ears- hearing intact Oropharynx- clear Neck- supple, no JVP Lymph- no cervical lymphadenopathy Lungs- Clear to ausculation bilaterally, normal work of breathing Chest- ICD pocket is well healed Heart- Regular rate and  rhythm, no murmurs, rubs or gallops, PMI not laterally displaced GI- soft, NT, ND, + BS Extremities- no clubbing, cyanosis, or edema MS- no significant deformity or atrophy Skin- no rash or lesion Psych- euthymic mood, full affect Neuro- strength and sensation are intact  ICD interrogation- reviewed in detail today,  See PACEART report  Assessment and Plan:

## 2010-07-17 NOTE — Cardiovascular Report (Signed)
NAMEDEUCE, PATERNOSTER              ACCOUNT NO.:  [id number]   MEDICAL RECORD NO.:  [id number]          PATIENT TYPE:  INP   LOCATION:  6522                         FACILITY:  [facility]   PHYSICIAN:  Lyn Records, M.D.   DATE OF BIRTH:  05-Nov-1934   DATE OF PROCEDURE:  10/13/2006  DATE OF DISCHARGE:                            CARDIAC CATHETERIZATION   INDICATIONS FOR PROCEDURE:  High-grade mid to distal LAD.   PROCEDURE PERFORMED:  Left anterior descending midvessel stenting.   DESCRIPTION:  The patient was brought to the cath lab.  He had been  previously taking Plavix.  He received 600 mg of Plavix in the cath lab  and a bolus followed by an infusion of Angiomax.  He was then given 2 mg  of IV Versed and 50 mcg of fentanyl.  A 6-French sheath was placed in  the right femoral artery.  An Clinical cytogeneticist guidewire was used with an  XB LAD 6-French left system guide catheter.  A 20 x 12-mm Maverick  balloon was then used for predilatation and 25 x 18 Cypher which  actually was oversized for this vessel was then deployed to 9 and 10  atmospheres.  We then used a 12-mm long Quantum balloon working within  the margins of the stent up to atmospheres of 16.  No symptoms occurred  during the procedure.  Less than 20% stenosis was noted within the mid-  stented region at the site of the culprit, but the lumen was clearly  much larger than the reference vessel on both sides of the stent.  TIMI  grade 3 flow was noted.   Angio-Seal was used for closure with good success.   CONCLUSION:  Successful percutaneous coronary intervention on the mid-  left anterior descending from 99% to less than 20% with TIMI grade 3  flow.   PLAN:  Aspirin and Plavix dual therapy for 12 months.  Coumadin will  continue to be held until renal artery stenting by Dr. Everette Rank  within the next 7-10 days, and then Coumadin will be resumed.  Plan  discharge in a.m.      Lyn Records, M.D.  Electronically  Signed     HWS/MEDQ  D:  10/13/2006  T:  10/14/2006  Job:  [id number]

## 2010-07-17 NOTE — Op Note (Signed)
[name], [name]              ACCOUNT NO.:  [id number]   MEDICAL RECORD NO.:  [id number]          PATIENT TYPE:  OIB   LOCATION:  6529                         FACILITY:  [facility]   PHYSICIAN:  Corky Crafts, MDDATE OF BIRTH:  [birth year]   DATE OF PROCEDURE:  10/27/2006  DATE OF DISCHARGE:                               OPERATIVE REPORT   REFERRING:  Dr. Verdis Prime.   PROCEDURES PERFORMED:  Right renal artery percutaneous transluminal  angioplasty/stent.   OPERATOR:  Corky Crafts, MD   INDICATIONS:  Renal artery stenosis, renal insufficiency and  hypertension.   PROCEDURE NARRATIVE:  The risks and benefits of peripheral angiography  were explained to the patient and informed consent was obtained.  A  prior diagnostic angiogram revealed a significant right renal artery  stenosis.  The patient was brought to the [facility] lab.  He was prepped and  draped in the usual sterile fashion.  His right groin was infiltrated  with 1% lidocaine.  A 6-French arterial sheath was placed into his right  femoral artery using modified Seldinger technique.  A 6-French IMA  guiding catheter was placed into the ostium of the right renal artery.  A stabilizer wire was advanced across the lesion.  Heparin was used for  anticoagulation.  A 5.0 x 15 mm aviator balloon was then placed across  the lesion and inflated to 6 atmospheres for 14 seconds.  The balloon  slipped forward.  The balloon was pulled back and inflated again to 2  atmospheres for 10 seconds and the balloon slipped again.  Finally on  the third try the balloon was successfully inflated 8 atmospheres for 25  seconds and stayed in place.  The balloon was withdrawn and a 5.0 x 12  mm Genesis stent was then placed across the lesion.  It was deployed at  10 atmospheres for 35 seconds.  The stent did slipped forward slightly  during deployment causing it to miss the ostium.  However, the ostium  was free of disease.   FINDINGS:   Angiography showed an 80% proximal right renal artery  stenosis.  The ostium was free of disease.   IMPRESSION:  Successful right renal artery stent placement with a 5.0 x  12 mm Genesis stent.  There is no residual stenosis.  There is normal  flow.   RECOMMENDATIONS:  The patient will continue with aspirin and Plavix for  at least 30 days.  The patient will be watched overnight and discharged  tomorrow if there are no complications.      Corky Crafts, MD  Electronically Signed    JSV/MEDQ  D:  10/27/2006  T:  10/28/2006  Job:  [id number]   cc:   Lyn Records, M.D.  Tera Mater. Clent Ridges, MD

## 2010-07-17 NOTE — Consult Note (Signed)
[name], [name]              ACCOUNT NO.:  [id number]   MEDICAL RECORD NO.:  [id number]          PATIENT TYPE:  INP   LOCATION:  6522                         FACILITY:  [facility]   PHYSICIAN:  Corky Crafts, MDDATE OF BIRTH:  [birth year]   DATE OF CONSULTATION:  10/14/2006  DATE OF DISCHARGE:  10/14/2006                                 CONSULTATION   REFERRING PHYSICIAN:  Dr. Verdis Prime.   REASON FOR CONSULTATION:  Renal artery stenosis, high blood pressure,  renal insufficiency.   HISTORY OF PRESENT ILLNESS:  The patient is a 76 year old man who was  being evaluated for coronary artery disease.  He was found to have an  LAD stenosis; incidentally, he was found to have a right renal artery  stenosis in the setting of high blood pressure and renal insufficiency;  I was asked to evaluate the patient for possible renal artery  revascularization.  He underwent stent to his LAD.  He has been  maintained on aspirin and Plavix.  Overall, he has been feeling well.  He has a stent placement yesterday.  He has not had any problems with  his groin.  He has had difficult-to-control blood pressure in the past  requiring multiple medications, as well as some renal insufficiency  which prevented having his intervention as well as diagnostic  catheterization at the same time.   MEDICATIONS AT HOME:  1. Avapro 300 mg a day.  2. Zocor 20 mg a day.  3. Toprol-XL 50 mg a day.  4. Plavix 75 mg a day.  5. Aspirin 325 mg a day.   ALLERGIES:  NO KNOWN DRUG ALLERGIES.   PAST MEDICAL HISTORY:  1. Coronary artery disease.  2. Hypertension.  3. Renal insufficiency.  4. Atrial fibrillation.   PAST SURGICAL HISTORY:  1. Shoulder surgery.  2. Forearm surgery.  3. Prostate biopsy.   SOCIAL HISTORY:  He does not smoke cigarettes.  He has a son who lives  in [place] whom he visits.  He does not drink heavily.   FAMILY HISTORY:  His father did have heart disease.   REVIEW OF SYSTEMS:  No  significant chest pain or shortness of breath  currently.  No focal weakness.  No rash.  No nausea or vomiting.  All  other systems negative.   PHYSICAL EXAM:  VITAL SIGNS:  Blood pressure 161/72 with a pulse of 67,  99% on room air.  GENERAL:  He is awake and alert, in no apparent distress.  HEAD:  Normocephalic, atraumatic.  EYES:  Extraocular movements intact.  NECK:  No JVD.  CARDIOVASCULAR:  Regular rate and rhythm, S1 and S2.  LUNGS:  Clear to auscultation bilaterally.  ABDOMEN:  Soft, nontender and nondistended.  EXTREMITIES:  Showed no edema.  No obvious hematoma on the right groin.   LABORATORY WORK:  Potassium 4.3, creatinine 1.45.  CBC shows hematocrit  of 42.5.   DIAGNOSTIC DATA:  Diagnostic angiography from July 31 shows a 80%  proximal right renal artery stenosis not involving the ostium.  The left  renal artery appears widely patent.   MEDICAL DECISION-MAKING:  [age]-year-old man with hypertension and  renal insufficiency, who had a right renal artery stenosis.   PLAN:  1. Given his renal insufficiency, I think it is reasonable to proceed      with renal artery revascularization.  The risks and benefits of the      procedure were explained to the patient and informed consent was      obtained.  2. In regards to his atrial fibrillation, he will be off his Coumadin      for some time while he is on the aspirin and Plavix.  3. He will continue his antihypertensive medication.  4. We will recheck blood work in about a week to make sure his      creatinine is okay, despite the dye load, and then plan for the      renal artery revascularization.      Corky Crafts, MD  Electronically Signed     JSV/MEDQ  D:  10/29/2006  T:  10/30/2006  Job:  [id number]

## 2010-07-17 NOTE — Cardiovascular Report (Signed)
NAMEWESTLY, HINNANT NO.:  [id number]   MEDICAL RECORD NO.:  [id number]          PATIENT TYPE:  OIB   LOCATION:  NA                           FACILITY:  [facility]   PHYSICIAN:  Lyn Records, M.D.   DATE OF BIRTH:  1935-01-01   DATE OF PROCEDURE:  10/02/2006  DATE OF DISCHARGE:                            CARDIAC CATHETERIZATION   INDICATIONS:  Mr. Deckman has chronic atrial fibrillation and recently  documented decreased LV function.  He also has renal insufficiency that  is particularly sensitive to diuretic therapy which is needed for blood  pressure control.  On a relatively low dose A/R diuretic get  disproportionate increase in BUN and creatinine, requiring that we use  suboptimal doses of therapy for blood pressure control.  This study is  being done to rule out significant coronary artery disease and also to  do an abdominal aortogram to rule out bilateral renal artery stenosis.   PROCEDURE PERFORMED:  1. Left heart catheter.  2. Selective coronary angiography.  3. Left ventriculography.  4. Abdominal aortography.  5. Selective right renal angiography.   DESCRIPTION:  After informed consent,a 4-French sheath was placed in the  right femoral using modified Seldinger technique.  A 4-French A2  multipurpose catheter was then used for hemodynamic recordings, left  ventriculography by hand injection, and selective right coronary  angiography.  We used a #4 left Judkins catheter, left coronary  angiography and an angled pigtail for abdominal aortography.  The  abdominal aortogram was done twice in an attempt to get better images of  the right renal artery particular because the superior mesenteric artery  caused overlap and difficulty in visualizing what appeared to be a tight  lesion in the proximal right renal artery.  Because of this we  selectively engage the right renal artery with a Judkins right catheter  and did selective injection using 2 mL of  contrast.  This documented  severe proximal right renal artery stenosis.   The case was terminated and manual pressure was used for hemostasis.  No  complications occurred.   RESULTS:  1. Hemodynamic data:  A:  Aortic pressure 156/73.  B.  The left ventricular pressure 159/12.   1. Left ventriculography:  Left ventricle is dilated.  Overall LV      function is diminished with EF in the 35-45% range.  No predominant      regional abnormalities noted.   1. Coronary angiography:      a.     Left main coronary:  Short, heavily calcified.      b.     The left anterior descending coronary:  The LAD is diffusely       diseased.  There is proximal calcification.  In the origin of       diagonal branches, there is eccentric 50% stenosis that is       calcified.  The mid to distal LAD contains a subtotal 99%       stenosis.  Vessel was relatively small in this region.  LAD does       wrap around left  ventricular apex.      c.     Circumflex artery:  Circumflex contains a mid vessel 50%       eccentric stenosis.  A large second obtuse marginal branch       contains proximal eccentric 50% stenosis.  No high-grade       obstruction is noted in the circumflex.      d.     Right coronary artery:  The right coronary is nondominant.       Proximal vessel contains 80% of mid vessel 80% stenosis.   1. Abdominal aortography:  Abdominal aortography does not demonstrate      aneurysm.  There is suggestion of right renal artery proximal      stenosis.  The superior mesenteric and left renal artery are widely      patent.   1. Selective right renal artery angiogram:  Selective engagement      demonstrates high-grade obstruction in the right renal artery, felt      to be greater than or equal to 90%.  There is sluggish distal flow.   CONCLUSION:  1. Severe coronary artery disease with 95-99% mid left anterior      descending stenosis. Proximal left anterior descending and left      main  calcification.  There is also severe disease in the      nondominant right coronary.  The circumflex has moderate disease,      but for the  most part is widely patent.  2. Global left ventricular dysfunction with ejection fraction in the      40% range.  3. Severe right renal artery stenosis, approximately 90% demonstrated      by selective injection.  4. Hypertension.   PLAN:  1. The patient will need PCI on the mid-LAD lesion.  2. Discussed this case with Dr. Everette Rank who was the back up in      case there was a problem with right renal selective angiography and      we feel that because of difficulty with using diuretics and ARB      therapy in this patient that right renal artery stenting is also      appropriate.  This will hopefully also make it a little bit easier      to control the patient's blood pressure.  3. WiLL followup renal function with blood work is 72 hours.  4. Will discuss this with the patient and his wife.      Lyn Records, M.D.  Electronically Signed     HWS/MEDQ  D:  10/02/2006  T:  10/03/2006  Job:  [id number]   cc:   Jeannett Senior A. Clent Ridges, MD  Corky Crafts, MD

## 2010-07-20 NOTE — Assessment & Plan Note (Signed)
[facility] OFFICE NOTE   [name], [name]                       MRN:          [id number]  DATE:05/30/2006                            DOB:          1934-12-13    A 76 year old gentleman with a history of chronic atrial fibrillation on  chronic Coumadin. For the past few days he has felt a bit unwell and  unsteady. He describes some mild dysequilibrium with postural change.  Denies any history of vertigo, fever or URI symptoms. He denies any  diplopia, swallowing difficulties, speech disturbance or other focal or  neurological symptoms. Denies any headaches. He does state that since  discontinuation of Prozac he has had much worsening depression since the  death of his wife.   PHYSICAL EXAMINATION:  VITAL SIGNS:  Blood pressure was 150/100, pulse  rate was well-controlled.  HEAD AND NECK:  Revealed no signs of trauma. Pupil responses were  normal. Extraocular muscles were full. There is no facial asymmetry.  Tongue and uvula were midline.  NEUROLOGICAL:  Shoulder shrug was normal. There is no drift to the  outstretched arms. Heel to nose, heel to shin testing normal. The  patient walks with a normal gait. He was able to walk on his toes and  heels without difficulty. Tandem walk was somewhat unsteady.   IMPRESSION:  Probable benign positional vertigo. Doubt CNS event.   DISPOSITION:  He will be clinically observed at the present time. Was  put on Zysol once daily. He has been asked to return in a couple of  weeks for follow up of his blood pressure, but return if he develops any  new symptoms. He is also given a new prescription for Prozac 20 mg  daily.     Gordy Savers, MD  Electronically Signed    PFK/MedQ  DD: 05/30/2006  DT: 05/30/2006  Job #: [id number]

## 2010-07-20 NOTE — H&P (Signed)
NAMEJEFF, FRIEDEN              ACCOUNT NO.:  [id number]   MEDICAL RECORD NO.:  [id number]          PATIENT TYPE:  AMB   LOCATION:  ENDO                         FACILITY:  [facility]   PHYSICIAN:  Rachael Fee, M.D. DATE OF BIRTH:  Feb 13, 1935   DATE OF ADMISSION:  05/13/2005  DATE OF DISCHARGE:  05/13/2005                                HISTORY & PHYSICAL   REASON FOR EXAMINATION:  Globus sensation.   The GE junction was confirmed using an Olympus adult upper gastroscope.  Fentanyl 50 mcg and Versed 4 mg were used to sedate him.  The location of  the GE junction Z-line was confirmed at 40 cm and then endoscope was  removed.  The Bravo ambulatory pH deployment apparatus was then introduced  orally.  The probe was deployed 5 cm above his Z-line successfully and  without immediate complications.   RESULTS OF TEST:  Total acid reflux for 48 hours.  Time that pH was less  than 4 was 55 minutes (2.1% of time).  DeMeester score acid reflux analysis  for day one was 4.8, day two 10.4 (normal are less than 14.72).   ASSESSMENT:  No evidence of pathologic acid reflux on this test.  Important  to note patient was taking no anti-acid medicines for several months prior  to and during this test.           ______________________________  Rachael Fee, M.D.     DPJ/MEDQ  D:  06/24/2005  T:  06/24/2005  Job:  [id number]

## 2010-07-20 NOTE — H&P (Signed)
[place]  Patient:    [name], [name]                     MRN: [id number] Adm. Date:  [id number] Attending:  Arlis Porta CC:         Talmadge Coventry, M.D.   History and Physical  CHIEF COMPLAINT:  Right lower quadrant abdominal pain.  HISTORY OF PRESENT ILLNESS:  This is a 76 year old male who he said for a number of months has had intermittent pain in the right lower quadrant but it dissipates fairly rapidly; yesterday, however, he had the onset of pain that persisted and worsened and led him to come to the emergency department.  He was subsequently evaluated by the emergency department physician who ordered a CT scan of his abdomen.  What this demonstrated were pericecal inflammatory changes in the superolateral aspect but the appendix was visualized and appeared to be normal.  I was asked to see him because of this.  He states he has never had anything like this before.  He denies any fever but has had chills.  He denies any change in bowel or urinary habits.  He has been eating. He denies nausea or vomiting.  PAST MEDICAL HISTORY 1. Benign prostatic hypertrophy. 2. Right rotator cuff tear.  PREVIOUS OPERATIONS:  Right rotator cuff repair; repair of lacerated right forearm tendon; prostate biopsy by Dr. Courtney Paris, Montez Hageman.  ALLERGIES:  None reported.  MEDICATIONS:  Cardura.  SOCIAL HISTORY:  He occasionally smokes a cigar.  Denies heavy alcohol use.  FAMILY HISTORY:  Positive for heart disease in his father.  REVIEW OF SYSTEMS:  CARDIAC:  No known heart disease or hypertension. PULMONARY:  No asthma, pneumonia, COPD.  GI:  No previous history of diverticulitis, hepatitis or peptic ulcer disease.  GU:  No kidney stones. ENDOCRINE:  No diabetes.  HEMATOLOGIC:  No bleeding disorders or transfusions. NEUROLOGIC:  No strokes or seizures.  PHYSICAL EXAMINATION  GENERAL:  A well-developed, well-nourished male who  appears slightly uncomfortable.  VITAL SIGNS:  His temperature is 98.4 with a blood pressure of 115/79, pulse 77, respirations are 16.  SKIN:  Warm and dry without jaundice.  HEENT:  Extraocular motions are intact.  Sclerae clear.  NECK:  Supple without masses.  CARDIOVASCULAR:  Heart demonstrates a regular rate and rhythm without a murmur.  RESPIRATORY:  Breath sounds equal and clear.  Respirations are nonlabored.  ABDOMEN:  Soft with right mid-to-lower quadrant pain and well-above McBurneys point.  It is more in the lateral aspect.  He has tenderness to palpation and percussion but there is no guarding.  Active bowel sounds are present.  No palpable masses.  RECTAL:  Rectal done by emergency department physician demonstrates no blood.  MUSCULOSKELETAL:  Full range of motion of extremities.  He has palpable pedal pulses.  LABORATORY DATA:  Laboratory data demonstrates a CMET that is normal except for bilirubin of 1.5.  WBC is normal at 7900 with no left shift.  Hemoglobin normal at 15.4.  IMAGING STUDY:  CT scan of his abdomen demonstrates pericecal inflammation in the superolateral aspect.  The appendix is visualized and appears normal. There appears to be one punctate focus of air outside the cecum with no free fluid present.  IMPRESSION:  Right cecal inflammatory process likely consistent with cecal diverticulitis, does not look ischemic in nature, is very focal and has no bowel thickening.  Appendix looks normal.  PLAN:  Admit  for IV antibiotics and observation.  If condition worsens, then we will proceed to exploratory laparotomy.  If he improves, then we will switch over to oral antibiotics and at six weeks time, will recommend colonoscopy to evaluate the situation further. DD:  10/11/99 TD:  10/12/99 Job: [id number]

## 2010-07-20 NOTE — H&P (Signed)
[facility]  Patient:    JANIE, STROTHMAN Visit Number: [id number] MRN: [id number]          Service Type: Attending:  Sherri Rad, M.D. Dictated by:   Sammuel Cooper. Mahar, P.A. Adm. Date:  03/20/01                           History and Physical  DATE OF BIRTH:  06/04/1934  CHIEF COMPLAINT:  Right foot pain.  HISTORY OF PRESENT ILLNESS:  The patient is a 76 year old male with a long history of right foot pain and problems including forefoot metatarsalgia.  He has failed various steps with conservative management including anti-inflammatories, rest, as well as heel pads and inserts in his shoes.  The risks and benefits of the proposed procedure were discussed with the patient by Dr. Leonides Grills, and he indicated understanding and decided to proceed.  ALLERGIES:  No known drug allergies.  MEDICATIONS:  Coumadin 5 mg q.d.  PAST MEDICAL HISTORY: 1. Atrial fibrillation, which is why he is on chronic Coumadin. 2. He is having frequent headaches.  PAST SURGICAL HISTORY: 1. Right rotator cuff repair in 2001. 2. Right carpal tunnel release in 2002. 3. Right biceps tendon repair in 2001.  SOCIAL HISTORY:  Denies tobacco use.  He drinks three to four drinks of alcohol per week.  He is widowed.  He has two grown children.  He does have numerous friends who will be available to help him postoperatively.  FAMILY HISTORY:  Family history is a little bit unclear as he was adopted. However, he does know that his mother lived into her 53s and died of "natural causes," and his biological father died also in his 60s secondary to what was thought to be an MI.  REVIEW OF SYSTEMS:  Positive for night sweats, shortness of breath, and also frequent headaches.  These are all new since he began taking amiodarone, which was prescribed to him by Dr. Garnette Scheuermann.  He will be seeing Dr. Katrinka Blazing later today for evaluation and will discuss these findings with him and  likely discontinue his amiodarone as his prescription has run out, and Dr. Katrinka Blazing has not refilled it at this point.  PHYSICAL EXAMINATION:  VITAL SIGNS:  Blood pressure 142/92, respirations 16 and unlabored, pulse 60 and irregular.  GENERAL:  [age]-year-old white male who is alert and oriented.  In no acute distress.  Well-nourished, well-groomed, appears his stated age, and is very pleasant and cooperative to exam.  HEENT:  Head normocephalic, atraumatic.  Extraocular movements intact.  Pupils are equal, round, and reactive to light.  Nares patent bilaterally.  Pharynx is clear, without any erythema or exudate.  NECK:  Soft and supple to palpation.  No bruits appreciated.  No lymphadenopathy or thyromegaly is noted.  CHEST:  Clear to auscultation bilaterally.  No rales, rhonchi, stridor, wheezes, or friction rubs.  BREASTS:  Not pertinent, not performed.  HEART:  Irregularly irregular rhythm and rate.  No murmurs, gallops, or rubs noted.  ABDOMEN:  Soft and supple to palpation.  Positive bowel sounds throughout. Nontender, nondistended.  No organomegaly noted.  GENITOURINARY:  Not pertinent, not performed.  EXTREMITIES:  He has tenderness to palpation over the right great toe as well as underneath the second and lightly under his third metatarsal heads.  He has minimal range of motion of approximately 10 degrees total in regards to the right great toe.  He has no  change in neurovascular status.  He has a very tight gastrocnemius.  Sensation is grossly intact.  SKIN:  Intact.  Without any lesions or rashes.  LABORATORY DATA:  X-rays reveal complete obliteration of the right great toe metatarsophalangeal joint.  IMPRESSION:  Right foot metatarsophalangeal joint arthritis, tight gastrocnemius, second to fourth toe hammertoes.  PLAN:  Admit to [facility] on [facility] 20, 2001, for right foot surgery which will be done by Dr. Leonides Grills.  The patient was  already given his postoperative prescriptions of Percocet #40, 1-2 tablets p.o. q.4-6h. p.r.n. pain, with no refills, and Robaxin 500 mg 1 tablet p.o. q.6-8h. p.r.n. spasm, no refills.  These were given to him because he does go to the [place], and this will be easier for him to get these filled now as it takes a bit of time to get them done.  He will get them filled prior to surgery now.  He will hold off on using these until after surgery.  The patients cardiologist is Dr. Garnette Scheuermann of [facility].  He will be seeing him later today for an operative clearance.  I discussed with the patient and gave him a note to give to Dr. Katrinka Blazing regarding having a medical clearance faxed to [place] upon his examination and clearance today.  I also asked him to address the patients Coumadin.  The patient had indicated to me that he is stopping his Coumadin in preparation for surgery.  We just need a written letter from Dr. Katrinka Blazing regarding that this is all right to do in preparation for the planned surgery.  The patients primary care physician is Dr. Smith Mince. Dictated by:   Sammuel Cooper. Mahar, P.A. Attending:  Sherri Rad, M.D. DD:  03/12/01 TD:  03/12/01 Job: [id number]

## 2010-07-20 NOTE — H&P (Signed)
NAMEJODECI, ROARTY              ACCOUNT NO.:  [id number]   MEDICAL RECORD NO.:  [id number]          PATIENT TYPE:  AMB   LOCATION:  ENDO                         FACILITY:  [facility]   PHYSICIAN:  Rachael Fee, M.D. DATE OF BIRTH:  10-09-1934   DATE OF ADMISSION:  05/06/2005  DATE OF DISCHARGE:  05/06/2005                                HISTORY & PHYSICAL   PROCEDURE:  Esophageal manometry study.   INDICATIONS FOR PROCEDURE:  Persistent globus sensation.   The procedure was done without sedation.  Viscous lidocaine 2% topical gel  was introduced to his left naris.  Esophageal manometry apparatus was  introduced into his left nostril, and measurements were taken.   RESULTS:  1.  Lower esophageal sphincter resting pressure 31 (normal).  2.  Upper esophageal resting pressure 9.6 mmHg, (normal less than 8).  3.  Normal peristalsis in 7 of 11 swallows (64%, greater than 80% is      normal).  4.  Maximum amplitude of peristalsis 146 mmHg (normal is less than 180).   ASSESSMENT:  Very mildly abnormal esophageal manometry with slightly  increased upper esophageal resting pressure and less than usual peristaltic  contractions.           ______________________________  Rachael Fee, M.D.     DPJ/MEDQ  D:  06/24/2005  T:  06/24/2005  Job:  [id number]

## 2010-07-20 NOTE — Op Note (Signed)
NAME:  [name], [name]                        ACCOUNT NO.:  [id number]   MEDICAL RECORD NO.:  [id number]                   PATIENT TYPE:  OIB   LOCATION:  2550                                 FACILITY:  [facility]   PHYSICIAN:  Jefry H. Pollyann Kennedy, M.D.                DATE OF BIRTH:  10/20/1934   DATE OF PROCEDURE:  06/30/2003  DATE OF DISCHARGE:                                 OPERATIVE REPORT   PREOPERATIVE DIAGNOSES:  1. Nasal septal deviation.  2. Inferior turbinate hypertrophy.  3. Tonsillar hypertrophy.  4. Obstructive sleep apnea syndrome.  5. Inability to tolerate CPAP.   POSTOPERATIVE DIAGNOSES:  1. Nasal septal deviation.  2. Inferior turbinate hypertrophy.  3. Tonsillar hypertrophy.  4. Obstructive sleep apnea syndrome.  5. Inability to tolerate CPAP.   PROCEDURE:  1. Nasal septoplasty.  2. Inferior turbinate subresection.  3. Tonsillectomy.  4. Uvulopalatopharyngoplasty.   SURGEON:  Jefry H. Pollyann Kennedy, M.D.   ANESTHESIA:  General endotracheal anesthesia was used.   COMPLICATIONS:  None.   ESTIMATED BLOOD LOSS:  30 mL.   REFERRING PHYSICIAN:  Tera Mater. Clent Ridges, M.D.   FINDINGS:  Severe leftward septal deviation with a large bony spur  posteriorly on the right side.  Bony hypertrophy of the inferior turbinates  with obstruction of the inferior meatus airways.  Mild to moderate  enlargement of the tonsils.  Severe thickening, redundancy and elongation of  the soft palate and uvula.   INDICATIONS:  This is a 76 year old gentleman who was diagnosed recently  with obstructive sleep apnea syndrome on polysonography.  He was not able to  tolerate CPAP on the CPAP testing.  Risks, benefits and alternatives and  complications of the procedure were explained to the patient who seemed to  understand and agreed to the surgery.   PROCEDURE:  The patient was taken to the operating room and placed on the  operating table in the supine position.  Following the induction of general  endotracheal anesthesia the patient was prepped and draped in a standard  fashion.  Oximetazolin spray was used preoperatively to the nasal cavities.   1. Nasal septoplasty.  1% Xylocaine with epinephrine was infiltrated into     the septum, the columella and the inferior turbinates.  Heparin-soaked     pledgets were placed bilaterally.  The left hemitransfixion incision was     created with a 15 scalpel used to elevate a mucoperichondrial flap down     the left side.  The bony cartilaginous junction was divided and a similar     flap was developed down the right side posteriorly.  The majority of the     ethmoid plate was thickened and deflected towards the left side causing     severe obstruction.  The complete ethmoid plate was resected using open     Laren Boom rongeur.  There was some deflection of the superior  attachment of the quadrangular cartilage also towards the left side and     part of this was taken down but leaving sufficient support for the nasal     tip.  Some cartilage and bone was shaved off the maxillary crest as well     and a large bony spur was removed from the right posterior maxillary     crest.  The mucosal incision was reapproximated with 4-0 chromic suture.     The septal flaps were quilted with 4-0 plain gut.   1. Submucous resection of the inferior turbinates:  The leading edge of the     inferior turbinates was incised in a vertical fashion.  Caudal elevator     was used to elevate the mucosa off the bone, and large fragments of bone     were resected bilaterally.  The turbinate remnants were then     outfractured.  The nasal cavities were suctioned of blood and secretions     and packed with rolled up Telfa coated with Bacitracin ointment.   1. Tonsillectomy:  The Crowe-Davis mouth gag was inserted into the oral     cavity and used to retract the tongue and mandible and attached to the     Mayo stand.  Tonsillectomy was performed using  electrocautery dissection     carefully dissecting the avascular plane between the capsule and the     constrictor muscles.  Large chunks of grayish-white tonsillar debris was     exuded on both sides.  The tonsils were sent together for pathologic     evaluation with the UP-3 specimen.   1. Uvulopalatopharyngoplasty:  The soft palate anterior mucosa was marked     with electrocautery for proposed mucosal incisions.  Sufficient residual     palate was preserved to prevent postoperative VPI.  Electrocautery was     used to incise through the anterior mucosa through the muscular layer and     then through the posterior mucosa keeping posterior mucosa slightly     longer for the following closure.  The fossil arches were thickened and     elongated as well and were partially resected.  The palate corners were     tacked up using 3-0 Vicryl suture.  The remainder palatal defect was     closed with interrupted simple Vicryl suture.  The lateral wounds were     partially closed superiorly again with 4-0 Vicryl suture.  The pharynx     was suctioned of blood and secretions and irrigated with saline.  The     patient was then awakened, extubated, and transferred to recovery in     stable condition.                                               Jefry H. Pollyann Kennedy, M.D.    JHR/MEDQ  D:  06/30/2003  T:  06/30/2003  Job:  [id number]

## 2010-07-20 NOTE — Op Note (Signed)
[facility]  Patient:    [name], [name] Visit Number: [id number] MRN: [id number]          Service Type: SUR Location: 4W 0449 01 Attending Physician:  Sherri Rad Dictated by:   Sherri Rad, M.D. Proc. Date: 03/20/01 Admit Date:  03/20/2001 Discharge Date: 03/21/2001                             Operative Report  PREOPERATIVE DIAGNOSES: 1. Right great toe metatarsophalangeal joint arthritis. 2. Right tight gastroc. 3. Right second through fourth hammertoes. 4. Right second metatarsalgia. 5. Extensor hallucis longus contracture.  PROCEDURE: 1. Right first metatarsophalangeal joint arthrodesis of the great toe. 2. Right gastroc slide. 3. Right second metatarsal shortening osteotomy. 4. Second through fourth toes metatarsophalangeal joint capsulotomies. 5. Second through fourth toes proximal phalanx head resections. 6. Right second through fourth toes extensor digitorum longus to proximal    phalanx transfers. 7. Right second through fourth toes extensor digitorum brevis to extensor    digitorum longus transfers. 8. Local bone graft. 9. Extensor hallucis longus tenolysis.  ANESTHESIA:  General endotracheal tube.  SURGEON:  Sherri Rad, M.D.  ASSISTANTJill Side P. Mahar, P.A.-C.  ESTIMATED BLOOD LOSS:  Minimal.  TOURNIQUET TIME:  118 minutes.  COMPLICATIONS:  None.  DISPOSITION:  Stable to PAR.  INDICATION:  This is a 76 year old gentleman who has had chronic plantar foot pain underneath the second and third metatarsal areas as well as hammertoe formation and arthritis of the right great toe metatarsophalangeal joint. The pain was interfering with his life performance so he could not do what he wants to do. He was consented for the above procedure. All risks, which include infection, nerve/vessel injury, malunion, nonunion, hardware irritation, hardware failure, possible hardware removal, possible necrosis of the toe and  amputation, recurrence of the deformity, and stiffness, were all explained and questions were answered.  DESCRIPTION OF PROCEDURE:  The patient was brought to the operating room and placed in supine position. After adequate general endotracheal tube was administered as well as Ancef 1 g IV piggyback, a Foley catheter was also placed. The right lower extremity was then prepped and draped in sterile manner with proximally placed thigh tourniquet. We started the procedure with a longitudinal incision on the medial border of the gastroc muscles tendinous junction. Dissection was carried down through skin and adipose tissue. Hemostasis was obtained. Fascia was opened in line with the incision. Gastroc/soleus interval was developed and soft tissues were removed from the posterior aspect of the gastroc tendon. Sural nerve was identified and protected throughout the procedure. The gastroc was then tenotomized with a Mayo scissor. Once this was done, this had excellent release of the gastroc. The wound was copiously irrigated with normal saline. The subcutaneous was closed with 3-0 Vicryl interrupted stitch. The skin was closed with 4-0 Monocryl subcuticular stitch. Steri-Strips were applied.  We then went through a dorsal wound that he had previously from a bunionectomy over the great toe. It was dorsomedial. Dissection was carried down to bone. EHL tendon was identified and was _______ dorsally. This was freed up both superiorly and inferiorly. Once the EHL was tenolysed, we then developed the soft tissues around the dorsal aspect of the metatarsophalangeal joint of the great toe. There was no cartilage left. What was remaining was debrided with a curet and a curved 1/4-inch osteotome. Once this was done, multiple 2.0 mm drill holes were  placed on either side of the joint. A plate was then fashioned to accommodate the dorsal aspect of the toe with the metatarsophalangeal joint reduced with the  proximal phalanx parallel with the hard plane of the plantar aspect of the foot. The great toe was then measured and at rest was approximately 3 to 4 mm from the surface of the plate. This was held with K wire initially, reduced, and an x-ray was obtained intraoperatively. Once the plate was fashioned and screws were placed in compression, we obtained another x-ray to verify the adequate reduction on the varus-valgus plane with about 5 degrees of valgus as well as the maintenance of the parallel alignment of the proximal phalanx and this was verified with a lateral view of the great toe. Once the joint was placed in compression, we then later, after we fixed all the toes, we used the bone graft from the proximal phalanx head resections to place stress/strain-relieving bone graft on either side of the metatarsophalangeal joint dorsomedially and laterally with the aid of a burr. This was tamped into place. This had excellent maintenance of obliteration of the joint space. All screws placed had excellent compression. The plate used was a five hole one-third tubular plate and screws used were 3.5 mm fully threaded cortical screws. We then, through a separate dorsal incision, approached the second metatarsal shaft. Dissection was carried down through soft tissue with scissors. Once the metatarsal was identified, soft tissues were elevated off the dorsal and dorsomediolateral aspects. A four hole 1/4 tubular plate was then chosen. This was placed. A drill hole was placed in the second from the most distal hole using a 2.0 drill, then the metatarsal was osteotomized, approximately 3 mm of bone was taken to shorten the metatarsal. Once this was done, the plate was applied. Screws were then applied in compression with the osteotomy maintained. This held the osteotomy perfectly and this was visualized in the OR. Once this was done, this was copiously irrigated with normal saline and we then  made incisions over the dorsal aspect of each toe, second through fourth, respectively. Dissection was carried down to the extensor mechanism.  The  extensor digitorum longus was tenotomized proximomedial and the brevis distolateral. With the tendons moved medially and laterally, respectively, the metatarsophalangeal joint was then opened dorsally and the collaterals were released. The proximal interphalangeal joint was then entered, collaterals released, and the proximal phalanx head was resected with the rongeur. After this was done, through the plantar plate of the PIP joint, the FDL tendon was identified. This was pulled through the wound and cut as distal as possible. Then, a 3.5 mm drill hole was placed in the base of the proximal phalanx. The FDL tendone was pulled through this hole after it was sewn to 3-0 PDS suture. A 45 K-wire double trocar was then placed through the base of the proximal phalanx and distal phalanx in an antegrade manner and then as the toe was reduced and the foot was in neutral position with tension on the FDL tendon and the toe reduced, K wire was then fired across the metatarsophalangeal joint with excellent purchase. Once this was done the FDL tendon was then sewn to the EDL tendon and then the EDB to EDL transfer was then performed as well with the 3-0 PDS suture. Once this was done, this had excellent maintenance of the alignment. The wound was copiously irrigated with normal saline. The K wires were bent at 90 degree angle and cut and  capped. Skin relieving incisions were made on either side of the K wire. This procedure was done for the third and fourth toes exactly the same through separate incisions and approaches. The wounds were all copiously irrigated with normal saline. The subcutaneous was closed with 3-0 Vicryl over the great toe. The remaining skin incisions were closed with 4-0 nylon _______ two-step stitch. Sterile dressings were applied.  Jones dressing was applied. The patient was stable to PAR. At the end of the procedure, prior to closure, the tourniquet was released at 118 minutes. All the toes pinked up nicely with brisk refill and there was bleeding from the skin-relieving incisions as well. There was no active bleeding. Dictated by:   Sherri Rad, M.D. Attending Physician:  Sherri Rad DD:  03/20/01 TD:  03/22/01 Job: [id number]

## 2010-07-20 NOTE — Procedures (Signed)
[place]. [facility]  Patient:    [name], [name] Visit Number: [id number] MRN: [id number]          Service Type: CAT Location: [facility] 2899 16 Attending Physician:  Lyn Records. Iii Dictated by:   Darci Needle, M.D. Proc. Date: 02/16/01 Admit Date:  02/16/2001   CC:         Talmadge Coventry, M.D.  Anesthesia   Procedure Report  INDICATION FOR PROCEDURE: Atrial fibrillation.  PROCEDURE PERFORMED: Elective electrical cardioversion.  DESCRIPTION OF PROCEDURE: After informed consent, the patient was given 200 mg of IV sodium Pentothal. After he was asleep using the biphasic device in a synchronized mode, 225 joules were delivered with reversion to normal sinus rhythm with a single shock. The patient awakened from the procedure without any neurological sequela and did well in the recovery phase.  CONCLUSIONS: Successful electrical cardioversion from atrial fibrillation to normal sinus rhythm with an AP electrode positioning system. No complications occurred.  PLAN: 1. Continue amiodarone. 2. Discharge later today. 3. Continue Coumadin therapy. Dictated by:   Darci Needle, M.D. Attending Physician:  Lyn Records. Iii DD:  02/16/01 TD:  02/16/01 Job: [id number]

## 2010-07-20 NOTE — Procedures (Signed)
[facility]  Patient:    [name], [name]                      MRN: [id number] Proc. Date: 12/18/99 Attending:  Petra Kuba, M.D. CC:         Adolph Pollack, M.D.   Procedure Report  PROCEDURE:  Colonoscopy with biopsy and polypectomy.  INDICATIONS:  Abnormal CT scan.  Persistent GI symptoms, want to rule out any signs of colitis or Crohns disease.  INFORMED CONSENT:  Consent was signed after risk, benefits, methods and options were thoroughly discussed in the office and on multiple occasions.  MEDICINES USED:  Demerol 50 mg, Versed 5 mg.  DESCRIPTION OF PROCEDURE:  Rectal inspection is pertinent for external hemorrhoids.  Digital examination was negative.  The video colonoscope was inserted, easily advanced around the colon to the cecum.  No abnormalities were seen on insertion.  The cecum was identified by the appendicial orifice and the ileocecal valve.  In fact the scope was inserted a short ways in the terminal ileum which was normal.  Further augmentation and a few scattered biopsies were obtained.  The scope was slowly withdrawn through the colon. Prep was adequate.  There was some liquid stool that required washing and suctioning.  A few scattered cecal and ascending biopsies were obtained.  No signs of diverticula, colitis, or Crohns were seen.  The scope was further withdrawn.  In the more proximal transverse a 5 mm polyp on a fold was seen and hot biopsied x 3.  The scope was further withdrawn.  In the distal sigmoid a 2 mm polyp was seen and was hot biopsied x 2.  The scope was further withdrawn and in the distal sigmoid we could see scarring from a previous polypectomy site but no other abnormalities were seen as we withdrew back to the rectum.  Once back in the rectum the scope was retroflexed, pertinent for some internal hemorrhoids.  The scope was straightened and readvanced a short ways up the sigmoid.  Air was suctioned, the  scope removed.  The patient tolerated the procedure well.  There was no obvious intermediate complication.  ENDOSCOPIC DIAGNOSIS: 1. Internal and external hemorrhoids. 2. Two small polyps, one in the sigmoid, one in the transverse, hot    biopsied. 3. Otherwise within normal limits to the terminal ileum, status post    random biopsies of the terminal ileum, cecum, and ascending.  PLAN:  Await pathology to help determine both further polyp screening as well as further work-up and plans for his abnormal CAT scan.  If symptoms continue, would probably repeat the CAT scan, otherwise I will see him back in the office in one month, put him on a 10-day postpolypectomy instructions, have him call me sooner or p.r.n. DD:  12/18/99 TD:  12/19/99 Job: [id number]

## 2010-07-20 NOTE — Discharge Summary (Signed)
[facility]  Patient:    [name], [name]                     MRN: [id number] Adm. Date:  [id number] Disc. Date: [id number] Attending:  Arlis Porta CC:         Llana Aliment. Randa Evens, M.D.  Petra Kuba, M.D.  Bertram Millard. Hyacinth Meeker, M.D.   Discharge Summary  PRINCIPAL DISCHARGE DIAGNOSIS:  Right-sided colitis.  SECONDARY DIAGNOSIS:  None.  PROCEDURES:  None.  REASON FOR ADMISSION:  Mr. Fosdick is a 76 year old male who, on October 10, 1999, began having progressively increasing right lower quadrant abdominal pain.  He had some pain radiating down toward the groin area.  He had no fever, chills, no change in bowel habits or urinary habits.  He presented to the emergency department and was noted to have some right lower quadrant and right mid abdominal tenderness, normal white blood cell count, and a CT scan which demonstrated some pericecal inflammation in the superolateral aspect. The appendix was visualized and tracked, and it did not appear to be involved in the process.  He subsequently was admitted.  HOSPITAL COURSE:  He was admitted for IV antibiotics.  Of note was that he had had a colonoscopy in May 2000, which did not demonstrate any significant diverticular disease.  I asked for consultation by Dr. Carman Ching.  Stool studies were ordered and were negative at this time.  He was started on IV Unasyn and Flagyl and began to improve.  His diet was slowly advanced.  His white count never was elevated and never had a left shift.  He was tolerating a diet, afebrile, more ambulatory, had minimal pain, and was discharged on October 15, 1999, in satisfactory condition.  DISPOSITION:  Discharged to home October 15, 1999.  FOLLOW-UP:  With me in the office in 10 days.  MEDICATIONS:  He will have Augmentin 875 mg p.o. q.12h.  He likely will need a repeat colonoscopy once the inflammatory process has totally resolved, which will probably be six  weeks. DD:  10/15/99 TD:  10/16/99 Job: [id number]

## 2010-07-20 NOTE — H&P (Signed)
[name], [name]              ACCOUNT NO.:  [id number]   MEDICAL RECORD NO.:  [id number]          PATIENT TYPE:  AMB   LOCATION:  ENDO                         FACILITY:  [facility]   PHYSICIAN:  Erasmo Leventhal, M.D.DATE OF BIRTH:  03-31-1934   DATE OF ADMISSION:  05/13/2005  DATE OF DISCHARGE:                                HISTORY & PHYSICAL   CHIEF COMPLAINT:  End-stage osteoarthritis of the left knee.   BRIEF HISTORY:  This is a 76 year old gentleman with a history of end-stage  osteoarthritis of his left knee.  He has been a patient of ours and has  undergone conservative management that has failed to control his pain and  discomfort.  He is now scheduled for total knee arthroplasty of the left  knee.  The surgery, risks, benefits, and aftercare were discussed in detail  with the patient including the increased risk of stiffness a young male  undergoing total knee arthroplasty.  He understands and accepts these risks  and wishes to proceed with surgery.   DRUG ALLERGIES:  None.   CURRENT MEDICATIONS:  None.   PAST SURGICAL HISTORY:  1.  Left shoulder arthroscopy.  2.  Left knee arthroscopy.   SERIOUS MEDICAL ILLNESSES:  None.   FAMILY HISTORY:  Negative.   SOCIAL HISTORY:  He is married.  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  He  does not smoke and drinks two beers a day.   REVIEW OF SYSTEMS:  CENTRAL NERVOUS SYSTEM:  Negative.   REVIEW OF SYSTEMS:  CENTRAL NERVOUS SYSTEM:  Negative for headache, blurred  vision or dizziness.  PULMONARY:  No shortness of breath, PND, orthopnea.  CARDIOVASCULAR:  No chest pain or palpitations.  GI:  Negative for ulcers or  hepatitis.  GU:  Negative for urinary tract difficulty.  MUSCULOSKELETAL:  Positive per HPI.   PHYSICAL EXAMINATION:  VITAL SIGNS:  BP 122/88, respirations 16, pulse 68  and regular.  GENERAL APPEARANCE:  This is a well-developed, well-nourished gentleman in  no acute distress.  HEENT:  Head  normocephalic.  Nose patent.  Ears patent.  Pupils are equal,  round and reactive to light.  Throat without injection.  NECK:  Supple without adenopathy.  Carotids 2+ without bruits.  CHEST:  Clear to auscultation.  No rales or rhonchi.  Respirations 16.  HEART:  Regular rate and rhythm at 68 beats per minute without murmur.  ABDOMEN:  Soft with active bowel sounds.  No masses, organomegaly.  NEUROLOGIC:  The patient alert and oriented to time, place and person.  Cranial nerves II-XII grossly intact.  EXTREMITIES:  The left knee has a varus deformity, 0-135 degree room.  Dorsalis pedis and posterior tibialis pulses are 2+ and sensation and  circulation are normal.  His right leg is within normal limits with the  exception of his right ankle which has some mild swelling and pain, both in  the ankle and in the Achilles.  He has no palpable masses or defects in the  Achilles.  Good range of motion but painful range of motion and tenderness  in the ankle  joint itself.   X-rays of his ankle are obtained and reveal osteoarthritic changes in the  ankle joint itself and a calcific tendinitis of the Achilles tendon prior to  surgery.  Will send him over to therapy for some acetic acid treatments to  his calcific tendinitis and will give him an ankle splint to wear for  comfort.  He will ice the ankle nightly.   IMPRESSION:  End-stage osteoarthritis, left knee.   PLAN:  Total knee arthroplasty of the left knee.      Jaquelyn Bitter. Chabon, P.A.    ______________________________  Erasmo Leventhal, M.D.    SJC/MEDQ  D:  05/13/2005  T:  05/14/2005  Job:  [id number]

## 2010-08-03 ENCOUNTER — Encounter: Payer: Self-pay | Admitting: *Deleted

## 2010-08-06 ENCOUNTER — Ambulatory Visit: Payer: Medicare (Managed Care) | Admitting: Endocrinology

## 2010-08-17 ENCOUNTER — Telehealth: Payer: Self-pay | Admitting: *Deleted

## 2010-08-17 NOTE — Telephone Encounter (Signed)
Refill on temazepam 30mg . Last filled on 11/03/09 #30. I don't see where this pt is on this medication. Please advise on this.

## 2010-08-20 NOTE — Telephone Encounter (Signed)
Call in #30 with 5 rf 

## 2010-08-21 MED ORDER — TEMAZEPAM 30 MG PO CAPS: 30.0000 mg | ORAL_CAPSULE | Freq: Every evening | ORAL | Status: AC | PRN

## 2010-08-21 NOTE — Telephone Encounter (Signed)
rx called in, pt aware 

## 2010-10-04 ENCOUNTER — Ambulatory Visit (INDEPENDENT_AMBULATORY_CARE_PROVIDER_SITE_OTHER): Payer: Medicare (Managed Care) | Admitting: *Deleted

## 2010-10-04 DIAGNOSIS — I509 Heart failure, unspecified: Secondary | ICD-10-CM

## 2010-10-04 DIAGNOSIS — I5022 Chronic systolic (congestive) heart failure: Secondary | ICD-10-CM

## 2010-10-04 DIAGNOSIS — I428 Other cardiomyopathies: Secondary | ICD-10-CM

## 2010-10-04 DIAGNOSIS — I4891 Unspecified atrial fibrillation: Secondary | ICD-10-CM

## 2010-10-08 ENCOUNTER — Other Ambulatory Visit: Payer: Self-pay

## 2010-10-08 ENCOUNTER — Encounter: Payer: Self-pay | Admitting: Internal Medicine

## 2010-10-09 LAB — REMOTE ICD DEVICE
AL IMPEDENCE ICD: 323 Ohm
ATRIAL PACING ICD: 4.2 pct
LV LEAD IMPEDENCE ICD: 817 Ohm
TOT-0002: 0
TOT-0006: 20111024000000
TZAT-0001ATACH: 1
TZAT-0001FASTVT: 1
TZAT-0002ATACH: NEGATIVE
TZAT-0002ATACH: NEGATIVE
TZAT-0002FASTVT: NEGATIVE
TZAT-0011SLOWVT: 10 ms
TZAT-0011SLOWVT: 10 ms
TZAT-0012SLOWVT: 200 ms
TZAT-0013SLOWVT: 2
TZAT-0018ATACH: NEGATIVE
TZAT-0018ATACH: NEGATIVE
TZAT-0018SLOWVT: NEGATIVE
TZAT-0018SLOWVT: NEGATIVE
TZAT-0019ATACH: 6 V
TZAT-0019ATACH: 6 V
TZAT-0019SLOWVT: 8 V
TZAT-0019SLOWVT: 8 V
TZAT-0020ATACH: 1.5 ms
TZAT-0020ATACH: 1.5 ms
TZAT-0020FASTVT: 1.5 ms
TZON-0005SLOWVT: 12
TZST-0001ATACH: 5
TZST-0001FASTVT: 3
TZST-0001FASTVT: 5
TZST-0001SLOWVT: 4
TZST-0002ATACH: NEGATIVE
TZST-0002ATACH: NEGATIVE
TZST-0002FASTVT: NEGATIVE
TZST-0002FASTVT: NEGATIVE
TZST-0003SLOWVT: 35 J
TZST-0003SLOWVT: 35 J
VENTRICULAR PACING ICD: 94.71 pct

## 2010-10-10 NOTE — Progress Notes (Signed)
icd remote check w/icm  

## 2010-10-15 ENCOUNTER — Telehealth: Payer: Self-pay | Admitting: Family Medicine

## 2010-10-15 MED ORDER — GLUCOSE BLOOD VI STRP: ORAL_STRIP | Status: DC

## 2010-10-15 NOTE — Telephone Encounter (Signed)
Cancel glucose strip order. Send to Dr. Romero Belling. I called and gave this info.

## 2010-10-23 ENCOUNTER — Encounter: Payer: Self-pay | Admitting: *Deleted

## 2010-12-14 LAB — COMPREHENSIVE METABOLIC PANEL
AST: 28
Albumin: 4.2
BUN: 25 — ABNORMAL HIGH
Calcium: 9.3
Chloride: 106
Creatinine, Ser: 1.35
GFR calc Af Amer: >60
Total Bilirubin: 1.9 — ABNORMAL HIGH
Total Protein: 7.1

## 2010-12-14 LAB — BASIC METABOLIC PANEL
BUN: 20
Creatinine, Ser: 1.29
GFR calc non Af Amer: 55 — ABNORMAL LOW
Glucose, Bld: 131 — ABNORMAL HIGH

## 2010-12-14 LAB — CBC
HCT: 45.7
MCV: 87.1
MCV: 88
Platelets: 135 — ABNORMAL LOW
Platelets: 164
RDW: 15 — ABNORMAL HIGH
RDW: 15.1 — ABNORMAL HIGH
WBC: 5.2
WBC: 5.3

## 2010-12-14 LAB — PROTIME-INR: INR: 1

## 2010-12-14 LAB — APTT: aPTT: 29

## 2010-12-17 ENCOUNTER — Other Ambulatory Visit: Payer: Self-pay | Admitting: Family Medicine

## 2010-12-17 LAB — BASIC METABOLIC PANEL
BUN: 18
Chloride: 106
Creatinine, Ser: 1.45
GFR calc Af Amer: 58 — ABNORMAL LOW
GFR calc non Af Amer: 48 — ABNORMAL LOW

## 2010-12-17 LAB — CBC
HCT: 46.2
Hemoglobin: 15.6
MCV: 87.2
MCV: 86.7
Platelets: 128 — ABNORMAL LOW
Platelets: 167
RBC: 4.87
RBC: 5.32
WBC: 5.9
WBC: 5.8

## 2011-01-10 ENCOUNTER — Ambulatory Visit (INDEPENDENT_AMBULATORY_CARE_PROVIDER_SITE_OTHER): Payer: Medicare (Managed Care) | Admitting: *Deleted

## 2011-01-10 ENCOUNTER — Other Ambulatory Visit: Payer: Self-pay

## 2011-01-10 ENCOUNTER — Encounter: Payer: Self-pay | Admitting: Internal Medicine

## 2011-01-10 DIAGNOSIS — I509 Heart failure, unspecified: Secondary | ICD-10-CM

## 2011-01-10 DIAGNOSIS — Z95810 Presence of automatic (implantable) cardiac defibrillator: Secondary | ICD-10-CM

## 2011-01-10 DIAGNOSIS — I428 Other cardiomyopathies: Secondary | ICD-10-CM

## 2011-01-10 DIAGNOSIS — I5022 Chronic systolic (congestive) heart failure: Secondary | ICD-10-CM

## 2011-01-13 LAB — REMOTE ICD DEVICE
ATRIAL PACING ICD: 1.22 pct
BAMS-0001: 170 {beats}/min
BATTERY VOLTAGE: 3.1151 V
FVT: 0
LV LEAD IMPEDENCE ICD: 779 Ohm
LV LEAD THRESHOLD: 1.75 V
PACEART VT: 0
RV LEAD THRESHOLD: 0.625 V
TOT-0006: 20111024000000
TZAT-0002ATACH: NEGATIVE
TZAT-0002ATACH: NEGATIVE
TZAT-0004SLOWVT: 8
TZAT-0005SLOWVT: 88 pct
TZAT-0005SLOWVT: 91 pct
TZAT-0011SLOWVT: 10 ms
TZAT-0011SLOWVT: 10 ms
TZAT-0012ATACH: 150 ms
TZAT-0012SLOWVT: 200 ms
TZAT-0019ATACH: 6 V
TZAT-0019ATACH: 6 V
TZAT-0019ATACH: 6 V
TZAT-0019FASTVT: 8 V
TZAT-0020ATACH: 1.5 ms
TZAT-0020FASTVT: 1.5 ms
TZON-0003SLOWVT: 320 ms
TZST-0001ATACH: 5
TZST-0001FASTVT: 2
TZST-0001FASTVT: 3
TZST-0001FASTVT: 5
TZST-0001SLOWVT: 4
TZST-0001SLOWVT: 6
TZST-0002FASTVT: NEGATIVE
TZST-0002FASTVT: NEGATIVE
TZST-0003SLOWVT: 30 J
TZST-0003SLOWVT: 35 J
VENTRICULAR PACING ICD: 89.56 pct
VF: 0

## 2011-01-17 ENCOUNTER — Other Ambulatory Visit: Payer: Self-pay | Admitting: *Deleted

## 2011-01-17 ENCOUNTER — Telehealth: Payer: Self-pay | Admitting: *Deleted

## 2011-01-17 MED ORDER — ALLOPURINOL 100 MG PO TABS: 100.0000 mg | ORAL_TABLET | Freq: Every day | ORAL | Status: DC

## 2011-01-17 MED ORDER — CARVEDILOL 12.5 MG PO TABS: 12.5000 mg | ORAL_TABLET | Freq: Two times a day (BID) | ORAL | Status: DC

## 2011-01-17 NOTE — Telephone Encounter (Signed)
Per Dr. Johney Frame, patient is to stop his Metoprolol and start Coreg 12.5mg  one po bid.  Patient is aware and in agreement.  [facility] on [address].

## 2011-01-18 NOTE — Progress Notes (Signed)
Remote icd check with icm  

## 2011-01-29 ENCOUNTER — Telehealth: Payer: Self-pay | Admitting: Family Medicine

## 2011-01-29 NOTE — Telephone Encounter (Signed)
Please advise - last seen 05/2010

## 2011-01-29 NOTE — Telephone Encounter (Signed)
[facility] [place] called and said that pt is needing refill of Novolog FlexPen called in to [phone]

## 2011-01-30 ENCOUNTER — Other Ambulatory Visit: Payer: Self-pay | Admitting: *Deleted

## 2011-01-30 NOTE — Telephone Encounter (Signed)
Pt cancelled ov with dr Everardo All

## 2011-01-30 NOTE — Telephone Encounter (Signed)
He sees an Actor, ask them

## 2011-01-30 NOTE — Telephone Encounter (Signed)
error 

## 2011-01-30 NOTE — Telephone Encounter (Signed)
[facility] - informed to call endocrinologist

## 2011-01-31 NOTE — Telephone Encounter (Signed)
I spoke with pharmacy and script is ready for pick up.

## 2011-01-31 NOTE — Telephone Encounter (Signed)
Please call in Novolog Flex pen to take as he is currently taking it for a 6 month supply

## 2011-02-08 ENCOUNTER — Encounter: Payer: Self-pay | Admitting: Endocrinology

## 2011-02-08 ENCOUNTER — Ambulatory Visit (INDEPENDENT_AMBULATORY_CARE_PROVIDER_SITE_OTHER): Payer: Medicare (Managed Care) | Admitting: Endocrinology

## 2011-02-08 ENCOUNTER — Other Ambulatory Visit (INDEPENDENT_AMBULATORY_CARE_PROVIDER_SITE_OTHER): Payer: Medicare (Managed Care)

## 2011-02-08 VITALS — BP 108/64 | HR 79 | Temp 98.0°F | Ht 68.0 in | Wt 193.4 lb

## 2011-02-08 DIAGNOSIS — G609 Hereditary and idiopathic neuropathy, unspecified: Secondary | ICD-10-CM

## 2011-02-08 DIAGNOSIS — E119 Type 2 diabetes mellitus without complications: Secondary | ICD-10-CM

## 2011-02-08 LAB — HEMOGLOBIN A1C: Hgb A1c MFr Bld: 8 % — ABNORMAL HIGH (ref 4.6–6.5)

## 2011-02-08 MED ORDER — HYDROCODONE-ACETAMINOPHEN 7.5-325 MG PO TABS: 1.0000 | ORAL_TABLET | Freq: Four times a day (QID) | ORAL | Status: DC | PRN

## 2011-02-08 NOTE — Progress Notes (Signed)
Subjective:    Patient ID: [name], male    DOB: [birth year], 76 y.o.   MRN: [id number]  HPI Pt returns for f/u of insulin-requiring DM (2010), complicated by renal insuff, peripheral sensory neuropathy, and cad.  no cbg record, but states cbg's vary from 120-210.  It is in general higher as the day goes on. He reports few years of severe pain of the legs, worst in the context of sleep, and assoc numbness.   Past Medical History  Diagnosis Date  . Hyperlipidemia   . Hypertension   . Diabetes mellitus   . Gout   . ED (erectile dysfunction)   . CAD (coronary artery disease)   . Benign prostatic hypertrophy   . Atrial fibrillation     sees Dr. Verdis Prime  . Chronic systolic dysfunction of left ventricle     mixed ischemic and nonischemic CM,  EF 35%    Past Surgical History  Procedure Date  . Turp vaporization   . Cardiac defibrillator placement 12/26/09    History   Social History  . Marital Status: Widowed    Spouse Name: N/A    Number of Children: N/A  . Years of Education: N/A   Occupational History  . Retired    Social History Main Topics  . Smoking status: Former Smoker    Types: Cigars  . Smokeless tobacco: Not on file   Comment: remote  . Alcohol Use: Yes  . Drug Use: No  . Sexually Active: Not on file   Other Topics Concern  . Not on file   Social History Narrative   Lives in [place] with significant other,  [name].    Current Outpatient Prescriptions on File Prior to Visit  Medication Sig Dispense Refill  . carvedilol (COREG) 12.5 MG tablet Take 1 tablet (12.5 mg total) by mouth 2 (two) times daily.  60 tablet  11  . finasteride (PROSCAR) 5 MG tablet Take 5 mg by mouth daily.        . furosemide (LASIX) 40 MG tablet Take 40 mg by mouth daily.        Marland Kitchen glucose blood test strip Use as instructed  50 each  99  . halobetasol (ULTRAVATE) 0.05 % cream Apply topically 2 (two) times daily.        . insulin aspart protamine-insulin aspart  (NOVOLOG 70/30) (70-30) 100 UNIT/ML injection 10 units with breakfast, and 6 units with the evening meal      . Insulin Pen Needle (PEN NEEDLES) 31G X 6 MM MISC by Does not apply route.        Marland Kitchen losartan (COZAAR) 100 MG tablet TAKE 1 TABLET EVERY DAY  30 tablet  1  . morphine (MS CONTIN) 15 MG 12 hr tablet Take 15 mg by mouth 2 (two) times daily.        . simvastatin (ZOCOR) 40 MG tablet Take 1 tablet (40 mg total) by mouth every evening.  30 tablet  11  . Thiamine HCl (VITAMIN B-1) 100 MG tablet Take 100 mg by mouth daily.        . vitamin E 1000 UNIT capsule Take 1,000 Units by mouth daily.          Allergies  Allergen Reactions  . Lisinopril     REACTION: hives    Family History  Problem Relation Age of Onset  . Cancer      breast/fhx  . Heart disease      fhx  .  Diabetes Neg Hx     BP 108/64  Pulse 79  Temp(Src) 98 F (36.7 C) (Oral)  Ht 5\' 8"  (1.727 m)  Wt 193 lb 6.4 oz (87.726 kg)  BMI 29.41 kg/m2  SpO2 95%  Review of Systems denies hypoglycemia.  He also has pain of the hands and wrists.      Objective:   Physical Exam VITAL SIGNS:  See vs page GENERAL: no distress Pulses: dorsalis pedis intact bilat.   Feet: no deformity.  no ulcer on the feet.  feet are of normal color and temp.  no edema.  There is bilateral onychomycosis. Neuro: sensation is intact to touch on the feet, but decreased from normal.   Lab Results  Component Value Date   HGBA1C 8.0* 02/08/2011      Assessment & Plan:  DM, needs increased rx Painful peripheral neuropathy, worse

## 2011-02-08 NOTE — Patient Instructions (Addendum)
blood tests are being requested for you today.  please call [phone] to hear your test results.  You will be prompted to enter the 9-digit "MRN" number that appears at the top left of this page, followed by #.  Then you will hear the message. Please schedule a regular physical with dr fry. Refer to a pain specialist.  you will receive a phone call, about a day and time for an appointment. check your blood sugar 2 times a day.  vary the time of day when you check, between before the 3 meals, and at bedtime.  also check if you have symptoms of your blood sugar being too high or too low.  please keep a record of the readings and bring it to your next appointment here.  please call us sooner if your blood sugar goes below 70, or if it stays over 200. good diet and exercise habits significanly improve the control of your diabetes.  please let me know if you wish to be referred to a dietician.  high blood sugar is very risky to your health.  you should see an eye doctor every year. controlling your blood pressure and cholesterol drastically reduces the damage diabetes does to your body.  this also applies to quitting smoking.  please discuss these with your doctor.  you should take an aspirin every day, unless you have been advised by a doctor not to.   Please come back for a follow-up appointment in 3 months (update: i left message on phone-tree:  Increase insulin to 10 am and 6 pm)

## 2011-02-20 ENCOUNTER — Encounter: Payer: Self-pay | Admitting: Family Medicine

## 2011-02-20 ENCOUNTER — Ambulatory Visit (INDEPENDENT_AMBULATORY_CARE_PROVIDER_SITE_OTHER): Payer: Medicare (Managed Care) | Admitting: Family Medicine

## 2011-02-20 VITALS — BP 102/74 | HR 70 | Temp 97.7°F | Wt 192.0 lb

## 2011-02-20 DIAGNOSIS — E785 Hyperlipidemia, unspecified: Secondary | ICD-10-CM

## 2011-02-20 DIAGNOSIS — G629 Polyneuropathy, unspecified: Secondary | ICD-10-CM

## 2011-02-20 DIAGNOSIS — E119 Type 2 diabetes mellitus without complications: Secondary | ICD-10-CM

## 2011-02-20 DIAGNOSIS — I10 Essential (primary) hypertension: Secondary | ICD-10-CM

## 2011-02-20 DIAGNOSIS — G589 Mononeuropathy, unspecified: Secondary | ICD-10-CM

## 2011-02-20 LAB — POCT URINALYSIS DIPSTICK
Bilirubin, UA: NEGATIVE
Glucose, UA: NEGATIVE
Leukocytes, UA: NEGATIVE
Nitrite, UA: NEGATIVE
pH, UA: 6.5

## 2011-02-20 LAB — HEPATIC FUNCTION PANEL: Albumin: 4 g/dL (ref 3.5–5.2)

## 2011-02-20 LAB — CBC WITH DIFFERENTIAL/PLATELET
Basophils Relative: 0.6 % (ref 0.0–3.0)
Eosinophils Absolute: 0.1 10*3/uL (ref 0.0–0.7)
Lymphocytes Relative: 21.1 % (ref 12.0–46.0)
MCHC: 34 g/dL (ref 30.0–36.0)
Neutrophils Relative %: 67.1 % (ref 43.0–77.0)
Platelets: 139 10*3/uL — ABNORMAL LOW (ref 150.0–400.0)
RBC: 5.15 Mil/uL (ref 4.22–5.81)
WBC: 7.2 10*3/uL (ref 4.5–10.5)

## 2011-02-20 LAB — LIPID PANEL
Cholesterol: 190 mg/dL (ref 0–200)
HDL: 55.5 mg/dL (ref >39.00)
VLDL: 28 mg/dL (ref 0.0–40.0)

## 2011-02-20 LAB — BASIC METABOLIC PANEL
BUN: 47 mg/dL — ABNORMAL HIGH (ref 6–23)
Calcium: 9.3 mg/dL (ref 8.4–10.5)
GFR: 23.6 mL/min — ABNORMAL LOW (ref >60.00)
Glucose, Bld: 177 mg/dL — ABNORMAL HIGH (ref 70–99)

## 2011-02-20 MED ORDER — FUROSEMIDE 40 MG PO TABS: 40.0000 mg | ORAL_TABLET | Freq: Every day | ORAL | Status: DC

## 2011-02-20 MED ORDER — FINASTERIDE 5 MG PO TABS: 5.0000 mg | ORAL_TABLET | Freq: Every day | ORAL | Status: DC

## 2011-02-20 MED ORDER — GABAPENTIN 300 MG PO CAPS: 300.0000 mg | ORAL_CAPSULE | Freq: Two times a day (BID) | ORAL | Status: DC

## 2011-02-20 NOTE — Progress Notes (Signed)
  Subjective:    Patient ID: [name], male    DOB: [birth year], 76 y.o.   MRN: [id number]  HPI Here to follow up. He got back from [place] last week, and he will be leaving to spend some time with his daughter in [place] later this week. He feels fine except for the neuropathy in his feet and legs. He was on gabapentin for awhile last year and it was helpful, but for some reason he got off it. He is not sure why. He saw Dr. Everardo All last week, and his A1c was up a bit to 8.0. His insulin was adjusted.   Review of Systems  Constitutional: Negative.   Respiratory: Negative.   Cardiovascular: Negative.        Objective:   Physical Exam  Constitutional: He appears well-developed and well-nourished.  Cardiovascular: Normal rate, regular rhythm, normal heart sounds and intact distal pulses.   Pulmonary/Chest: Effort normal and breath sounds normal.  Musculoskeletal: Normal range of motion. He exhibits no edema and no tenderness.  Lymphadenopathy:    He has no cervical adenopathy.          Assessment & Plan:  Get fasting labs today. Check lipids etc. His HTN is stable. Get back on Gabapentin 300 mg bid for a few weeks, then we will probably up this to tid.

## 2011-02-21 ENCOUNTER — Telehealth: Payer: Self-pay | Admitting: Family Medicine

## 2011-02-21 ENCOUNTER — Encounter: Payer: Self-pay | Admitting: Family Medicine

## 2011-02-21 NOTE — Progress Notes (Signed)
Quick Note:  Left voice message for pt to return my call and I put a copy of results in mail. ______

## 2011-02-21 NOTE — Telephone Encounter (Signed)
Saw dr fry on the 19th. Patient stated that the nurse had told him that he was going to be referred to a specialist. Please advise. Thanks.

## 2011-02-22 ENCOUNTER — Telehealth: Payer: Self-pay | Admitting: *Deleted

## 2011-02-22 ENCOUNTER — Encounter: Payer: Self-pay | Admitting: Endocrinology

## 2011-02-22 NOTE — Telephone Encounter (Signed)
NO I did not intend to send him to a "specialist". I am note sure who he is thinking about. We are trying Neurontin for the neuropathy, and this will take a little time to do

## 2011-02-22 NOTE — Telephone Encounter (Signed)
Spoke with pt and he stopped the medication and another MD has referred him to a pain clinic.

## 2011-02-22 NOTE — Telephone Encounter (Signed)
Spoke with pt and he has stopped the medication and another MD has referred him to a pain center.

## 2011-02-22 NOTE — Telephone Encounter (Signed)
Pt is having pain in his back and arms due neurotin.

## 2011-02-22 NOTE — Telephone Encounter (Signed)
Let's take it nice and slow. Decrease the Neurontin to once a day for 2 weeks and then go up to bid

## 2011-03-05 HISTORY — PX: OTHER SURGICAL HISTORY: SHX169

## 2011-03-20 ENCOUNTER — Telehealth: Payer: Self-pay | Admitting: *Deleted

## 2011-03-20 NOTE — Telephone Encounter (Signed)
Since he had the flu shot, he should be fine

## 2011-03-20 NOTE — Telephone Encounter (Signed)
Pt's grandaughter has been diagnosed with influenza A, and he wants to know if he should start Tamiflu?  He had the flu vaccine in October.  No symptoms.

## 2011-03-20 NOTE — Telephone Encounter (Signed)
Notified pt. 

## 2011-04-01 ENCOUNTER — Telehealth: Payer: Self-pay

## 2011-04-01 NOTE — Telephone Encounter (Signed)
Pt called inquiring about Lyrica and whether it can help with his Neuropathy sxs, please advise.

## 2011-04-01 NOTE — Telephone Encounter (Signed)
Pt was ref to pain specialist almost 2 mos ago.  What is the progress?

## 2011-04-02 NOTE — Telephone Encounter (Signed)
Left message for pt to callback office.  

## 2011-04-02 NOTE — Telephone Encounter (Signed)
Pt has appointment scheduled with pain clinic in March.

## 2011-04-02 NOTE — Telephone Encounter (Signed)
It is similar to gabapentin, so no need to add it on

## 2011-04-03 ENCOUNTER — Telehealth: Payer: Self-pay | Admitting: Family Medicine

## 2011-04-03 MED ORDER — LOSARTAN POTASSIUM 100 MG PO TABS: 100.0000 mg | ORAL_TABLET | Freq: Every day | ORAL | Status: DC

## 2011-04-03 MED ORDER — FUROSEMIDE 40 MG PO TABS: 40.0000 mg | ORAL_TABLET | Freq: Every day | ORAL | Status: DC

## 2011-04-03 NOTE — Telephone Encounter (Signed)
Scripts sent e-scribe, pt requested to be sent to [facility].

## 2011-04-03 NOTE — Telephone Encounter (Signed)
Pt informed of MD's advisement. 

## 2011-04-18 ENCOUNTER — Encounter: Payer: Medicare (Managed Care) | Admitting: *Deleted

## 2011-04-22 ENCOUNTER — Encounter: Payer: Self-pay | Admitting: *Deleted

## 2011-04-26 ENCOUNTER — Ambulatory Visit: Payer: Medicare (Managed Care) | Admitting: Physical Medicine and Rehabilitation

## 2011-04-29 ENCOUNTER — Ambulatory Visit (INDEPENDENT_AMBULATORY_CARE_PROVIDER_SITE_OTHER): Payer: Medicare (Managed Care) | Admitting: *Deleted

## 2011-04-29 ENCOUNTER — Encounter: Payer: Self-pay | Admitting: Internal Medicine

## 2011-04-29 DIAGNOSIS — I428 Other cardiomyopathies: Secondary | ICD-10-CM

## 2011-04-29 DIAGNOSIS — I4891 Unspecified atrial fibrillation: Secondary | ICD-10-CM

## 2011-04-29 DIAGNOSIS — I509 Heart failure, unspecified: Secondary | ICD-10-CM

## 2011-05-03 LAB — REMOTE ICD DEVICE
AL AMPLITUDE: 0.3 mv
AL IMPEDENCE ICD: 323 Ohm
BATTERY VOLTAGE: 3.1014 V
LV LEAD IMPEDENCE ICD: 836 Ohm
PACEART VT: 0
RV LEAD AMPLITUDE: 14.125 mv
RV LEAD IMPEDENCE ICD: 513 Ohm
RV LEAD THRESHOLD: 0.625 V
TOT-0001: 1
TOT-0002: 0
TOT-0006: 20111024000000
TZAT-0001ATACH: 1
TZAT-0001ATACH: 2
TZAT-0001ATACH: 3
TZAT-0001FASTVT: 1
TZAT-0001SLOWVT: 1
TZAT-0001SLOWVT: 2
TZAT-0002ATACH: NEGATIVE
TZAT-0002FASTVT: NEGATIVE
TZAT-0004SLOWVT: 8
TZAT-0005SLOWVT: 88 pct
TZAT-0005SLOWVT: 91 pct
TZAT-0012ATACH: 150 ms
TZAT-0012FASTVT: 200 ms
TZAT-0013SLOWVT: 2
TZAT-0013SLOWVT: 2
TZAT-0018ATACH: NEGATIVE
TZAT-0018ATACH: NEGATIVE
TZAT-0018ATACH: NEGATIVE
TZAT-0018FASTVT: NEGATIVE
TZAT-0018SLOWVT: NEGATIVE
TZAT-0018SLOWVT: NEGATIVE
TZAT-0019ATACH: 6 V
TZAT-0019FASTVT: 8 V
TZAT-0020ATACH: 1.5 ms
TZAT-0020SLOWVT: 1.5 ms
TZAT-0020SLOWVT: 1.5 ms
TZON-0003VSLOWVT: 450 ms
TZON-0004SLOWVT: 28
TZON-0004VSLOWVT: 20
TZON-0005SLOWVT: 12
TZST-0001ATACH: 5
TZST-0001ATACH: 6
TZST-0001FASTVT: 4
TZST-0001FASTVT: 5
TZST-0001FASTVT: 6
TZST-0001SLOWVT: 3
TZST-0001SLOWVT: 5
TZST-0002ATACH: NEGATIVE
TZST-0002ATACH: NEGATIVE
TZST-0002FASTVT: NEGATIVE
TZST-0002FASTVT: NEGATIVE
TZST-0003SLOWVT: 35 J
TZST-0003SLOWVT: 35 J

## 2011-05-06 ENCOUNTER — Encounter: Payer: Medicare (Managed Care) | Admitting: Physical Medicine and Rehabilitation

## 2011-05-10 ENCOUNTER — Encounter: Payer: Self-pay | Admitting: Physical Medicine and Rehabilitation

## 2011-05-10 ENCOUNTER — Encounter
Payer: Medicare Other | Attending: Physical Medicine and Rehabilitation | Admitting: Physical Medicine and Rehabilitation

## 2011-05-10 VITALS — BP 93/57 | HR 88 | Resp 18 | Ht 68.0 in | Wt 197.0 lb

## 2011-05-10 DIAGNOSIS — G8929 Other chronic pain: Secondary | ICD-10-CM | POA: Insufficient documentation

## 2011-05-10 DIAGNOSIS — R209 Unspecified disturbances of skin sensation: Secondary | ICD-10-CM | POA: Insufficient documentation

## 2011-05-10 DIAGNOSIS — M76899 Other specified enthesopathies of unspecified lower limb, excluding foot: Secondary | ICD-10-CM

## 2011-05-10 DIAGNOSIS — M79609 Pain in unspecified limb: Secondary | ICD-10-CM | POA: Insufficient documentation

## 2011-05-10 DIAGNOSIS — R279 Unspecified lack of coordination: Secondary | ICD-10-CM | POA: Insufficient documentation

## 2011-05-10 DIAGNOSIS — R2689 Other abnormalities of gait and mobility: Secondary | ICD-10-CM | POA: Insufficient documentation

## 2011-05-10 DIAGNOSIS — E119 Type 2 diabetes mellitus without complications: Secondary | ICD-10-CM | POA: Insufficient documentation

## 2011-05-10 DIAGNOSIS — M25559 Pain in unspecified hip: Secondary | ICD-10-CM | POA: Insufficient documentation

## 2011-05-10 DIAGNOSIS — R29818 Other symptoms and signs involving the nervous system: Secondary | ICD-10-CM

## 2011-05-10 DIAGNOSIS — I2510 Atherosclerotic heart disease of native coronary artery without angina pectoris: Secondary | ICD-10-CM | POA: Insufficient documentation

## 2011-05-10 DIAGNOSIS — I509 Heart failure, unspecified: Secondary | ICD-10-CM | POA: Insufficient documentation

## 2011-05-10 DIAGNOSIS — M542 Cervicalgia: Secondary | ICD-10-CM | POA: Insufficient documentation

## 2011-05-10 DIAGNOSIS — N289 Disorder of kidney and ureter, unspecified: Secondary | ICD-10-CM | POA: Insufficient documentation

## 2011-05-10 DIAGNOSIS — I5022 Chronic systolic (congestive) heart failure: Secondary | ICD-10-CM | POA: Insufficient documentation

## 2011-05-10 DIAGNOSIS — M7071 Other bursitis of hip, right hip: Secondary | ICD-10-CM

## 2011-05-10 DIAGNOSIS — R208 Other disturbances of skin sensation: Secondary | ICD-10-CM | POA: Insufficient documentation

## 2011-05-10 DIAGNOSIS — M545 Low back pain, unspecified: Secondary | ICD-10-CM

## 2011-05-10 DIAGNOSIS — M48062 Spinal stenosis, lumbar region with neurogenic claudication: Secondary | ICD-10-CM

## 2011-05-10 DIAGNOSIS — G9519 Other vascular myelopathies: Secondary | ICD-10-CM
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
## 2011-05-10 NOTE — Patient Instructions (Addendum)
Will get lumbar x rays Set up for right hip injection Follow up in one month Consider physical therapy for right hip after injection

## 2011-05-24 NOTE — Progress Notes (Signed)
Remote icd check w/icm  

## 2011-06-05 ENCOUNTER — Encounter: Payer: Self-pay | Admitting: Physical Medicine and Rehabilitation

## 2011-06-12 ENCOUNTER — Encounter: Payer: Medicare (Managed Care) | Admitting: Physical Medicine and Rehabilitation

## 2011-06-15 ENCOUNTER — Other Ambulatory Visit: Payer: Self-pay | Admitting: Family Medicine

## 2011-06-25 ENCOUNTER — Telehealth: Payer: Self-pay | Admitting: *Deleted

## 2011-06-25 NOTE — Telephone Encounter (Signed)
Has this been done?

## 2011-06-25 NOTE — Telephone Encounter (Signed)
Appt given to pt for tomorrow with Dr. Clent Ridges.

## 2011-06-25 NOTE — Telephone Encounter (Signed)
Pt is calling after talking to his Cardiologist's nurse.  He is having a "feeling of listlessness in arms with tingling".  No SOB or chest pain.  Blurred vision episode today.  Asking to see Dr. Lovell Sheehan.

## 2011-06-25 NOTE — Telephone Encounter (Signed)
Pt wants to see Dr. Clent Ridges re: complaints of "listless arms"

## 2011-06-26 ENCOUNTER — Ambulatory Visit (INDEPENDENT_AMBULATORY_CARE_PROVIDER_SITE_OTHER): Payer: Medicare (Managed Care) | Admitting: Family Medicine

## 2011-06-26 ENCOUNTER — Encounter: Payer: Self-pay | Admitting: Family Medicine

## 2011-06-26 VITALS — BP 120/84 | HR 85 | Temp 98.5°F | Wt 190.0 lb

## 2011-06-26 DIAGNOSIS — E119 Type 2 diabetes mellitus without complications: Secondary | ICD-10-CM

## 2011-06-26 DIAGNOSIS — R531 Weakness: Secondary | ICD-10-CM

## 2011-06-26 DIAGNOSIS — R5381 Other malaise: Secondary | ICD-10-CM

## 2011-06-26 LAB — LIPID PANEL
Cholesterol: 162 mg/dL (ref 0–200)
HDL: 57.3 mg/dL (ref >39.00)

## 2011-06-26 LAB — BASIC METABOLIC PANEL
BUN: 35 mg/dL — ABNORMAL HIGH (ref 6–23)
CO2: 24 mEq/L (ref 19–32)
Glucose, Bld: 124 mg/dL — ABNORMAL HIGH (ref 70–99)
Potassium: 5.2 mEq/L — ABNORMAL HIGH (ref 3.5–5.1)
Sodium: 140 mEq/L (ref 135–145)

## 2011-06-26 NOTE — Progress Notes (Signed)
  Subjective:    Patient ID: [name], male    DOB: [birth year], 77 y.o.   MRN: [id number]  HPI Here for an episode yesterday of weakness and shakiness which lasted about an hour. He admits to skipping meals frequently, and yesterday he had skipped breakfast and lunch. By early afternoon this spell started, but he denies any HA or SOB or chest pains. No NVD. He finally drank some fluids and ate some food, and he quickly got over it. He has felt fine since.    Review of Systems  Respiratory: Negative.   Cardiovascular: Negative.   Gastrointestinal: Negative.   Neurological: Positive for weakness. Negative for dizziness, tremors, seizures, syncope, facial asymmetry, speech difficulty, light-headedness, numbness and headaches.       Objective:   Physical Exam  Constitutional: He is oriented to person, place, and time. He appears well-developed and well-nourished.  HENT:  Head: Normocephalic and atraumatic.  Eyes: Conjunctivae are normal. Pupils are equal, round, and reactive to light.  Neck: Neck supple. No thyromegaly present.  Cardiovascular: Normal rate, regular rhythm, normal heart sounds and intact distal pulses.  Exam reveals no gallop and no friction rub.   No murmur heard. Pulmonary/Chest: Effort normal and breath sounds normal.  Lymphadenopathy:    He has no cervical adenopathy.  Neurological: He is alert and oriented to person, place, and time. He has normal reflexes. No cranial nerve deficit. He exhibits normal muscle tone. Coordination normal.          Assessment & Plan:  This was probably a hypoglycemic event. We will check labs today. Advised him to never skip meals and to have healthy snacks between meals

## 2011-06-26 NOTE — Telephone Encounter (Signed)
I see he is on my schedule for today

## 2011-06-28 ENCOUNTER — Telehealth: Payer: Self-pay | Admitting: *Deleted

## 2011-06-28 NOTE — Telephone Encounter (Signed)
Dr frye's pt

## 2011-06-28 NOTE — Telephone Encounter (Signed)
Pt is asking for lab results, please. 

## 2011-06-28 NOTE — Telephone Encounter (Signed)
I spoke with pt and put a copy of results in mail. 

## 2011-06-28 NOTE — Telephone Encounter (Signed)
See report

## 2011-06-28 NOTE — Progress Notes (Signed)
Quick Note:  I spoke with pt and put a copy of results in mail. ______ 

## 2011-07-03 ENCOUNTER — Encounter: Payer: Self-pay | Admitting: Internal Medicine

## 2011-07-03 ENCOUNTER — Ambulatory Visit (INDEPENDENT_AMBULATORY_CARE_PROVIDER_SITE_OTHER): Payer: Medicare Other | Admitting: Internal Medicine

## 2011-07-03 VITALS — BP 130/70 | HR 68 | Resp 18 | Ht 68.0 in | Wt 194.0 lb

## 2011-07-03 DIAGNOSIS — E785 Hyperlipidemia, unspecified: Secondary | ICD-10-CM

## 2011-07-03 DIAGNOSIS — M791 Myalgia, unspecified site: Secondary | ICD-10-CM

## 2011-07-03 DIAGNOSIS — I4891 Unspecified atrial fibrillation: Secondary | ICD-10-CM

## 2011-07-03 DIAGNOSIS — I509 Heart failure, unspecified: Secondary | ICD-10-CM

## 2011-07-03 DIAGNOSIS — IMO0001 Reserved for inherently not codable concepts without codable children: Secondary | ICD-10-CM

## 2011-07-03 DIAGNOSIS — I5022 Chronic systolic (congestive) heart failure: Secondary | ICD-10-CM

## 2011-07-03 LAB — ICD DEVICE OBSERVATION
AL AMPLITUDE: 0.3 mv
ATRIAL PACING ICD: 0 pct
RV LEAD AMPLITUDE: 15.5 mv
RV LEAD THRESHOLD: 0.875 V
TZAT-0001ATACH: 2
TZAT-0001ATACH: 3
TZAT-0001SLOWVT: 1
TZAT-0001SLOWVT: 2
TZAT-0002ATACH: NEGATIVE
TZAT-0002ATACH: NEGATIVE
TZAT-0002FASTVT: NEGATIVE
TZAT-0005SLOWVT: 88 pct
TZAT-0005SLOWVT: 91 pct
TZAT-0012ATACH: 150 ms
TZAT-0012FASTVT: 200 ms
TZAT-0018ATACH: NEGATIVE
TZAT-0018ATACH: NEGATIVE
TZAT-0018FASTVT: NEGATIVE
TZAT-0018SLOWVT: NEGATIVE
TZAT-0018SLOWVT: NEGATIVE
TZAT-0019ATACH: 6 V
TZAT-0019ATACH: 6 V
TZAT-0019ATACH: 6 V
TZAT-0019FASTVT: 8 V
TZAT-0019SLOWVT: 8 V
TZAT-0020SLOWVT: 1.5 ms
TZON-0004VSLOWVT: 20
TZON-0005SLOWVT: 12
TZST-0001ATACH: 5
TZST-0001ATACH: 6
TZST-0001FASTVT: 3
TZST-0001FASTVT: 5
TZST-0001FASTVT: 6
TZST-0001SLOWVT: 3
TZST-0001SLOWVT: 5
TZST-0002ATACH: NEGATIVE
TZST-0002FASTVT: NEGATIVE
TZST-0002FASTVT: NEGATIVE
TZST-0003SLOWVT: 35 J
VENTRICULAR PACING ICD: 92.9 pct

## 2011-07-03 LAB — CARDIAC PANEL: Relative Index: 6.5 calc — ABNORMAL HIGH (ref 0.0–2.5)

## 2011-07-03 LAB — SEDIMENTATION RATE: Sed Rate: 10 mm/hr (ref 0–22)

## 2011-07-03 NOTE — Progress Notes (Signed)
Addended by: Tonita Phoenix on: 07/03/2011 10:38 AM   Modules accepted: Orders

## 2011-07-03 NOTE — Assessment & Plan Note (Signed)
Probably permanent afib He stopped his coumadin previously due to bruising.  I have strongly advised that he start either pradaxa or xarelto and stop ASA at that time.  He will contemplate this option and discuss further with Dr Katrinka Blazing.  He would be at high risk for stroke off of anticoagulation.

## 2011-07-03 NOTE — Progress Notes (Signed)
PCP: Nelwyn Salisbury, MD, MD Primary Cardiologist:  Dr Verdis Prime  [name] is a 77 y.o. male with a h/o miexed ischemic/ nonischemic CM, LBBB, NYHA Class III CHF sp BiV ICD (MDT) by Dr Amil Amen who presents today to for follow-up in the Electrophysiology device clinic.  He is presently doing well and remains reasonably active.  He has episodic weakness of both arms and legs.  This has been occuring intermittently for several months.  He reports ongoing fatigue and shortness of breath with moderate activity. Today, he  denies symptoms of palpitations, chest pain, orthopnea, PND, lower extremity edema, dizziness, presyncope, syncope, or other neurologic sequela.   Past Medical History  Diagnosis Date  . Hyperlipidemia   . Hypertension   . Gout   . ED (erectile dysfunction)   . CAD (coronary artery disease)   . Benign prostatic hypertrophy   . Atrial fibrillation     sees Dr. Verdis Prime  . Chronic systolic dysfunction of left ventricle     mixed ischemic and nonischemic CM,  EF 35%  . Diabetes mellitus     sees Dr. Everardo All     Past Surgical History  Procedure Date  . Turp vaporization   . Cardiac defibrillator placement 12/26/09  . Cervical epidural injection 2013    History   Social History  . Marital Status: Widowed    Spouse Name: N/A    Number of Children: N/A  . Years of Education: N/A   Occupational History  . Retired    Social History Main Topics  . Smoking status: Current Some Day Smoker    Types: Cigars  . Smokeless tobacco: Never Used   Comment: 1-2 cigars when golfing  . Alcohol Use: 1.5 oz/week    3 drink(s) per week  . Drug Use: No  . Sexually Active: Not on file   Other Topics Concern  . Not on file   Social History Narrative   Lives in [place] with significant other,  [name].    Family History  Problem Relation Age of Onset  . Cancer      breast/fhx  . Heart disease      fhx  . Diabetes Neg Hx   . Cancer Father     Allergies    Allergen Reactions  . Lisinopril     REACTION: hives Patient doesn't recall this    Current Outpatient Prescriptions  Medication Sig Dispense Refill  . amLODipine (NORVASC) 10 MG tablet Take 10 mg by mouth daily.      . carvedilol (COREG) 12.5 MG tablet Take 1 tablet (12.5 mg total) by mouth 2 (two) times daily.  60 tablet  11  . finasteride (PROSCAR) 5 MG tablet Take 1 tablet (5 mg total) by mouth daily.  90 tablet  3  . furosemide (LASIX) 40 MG tablet Take 1 tablet (40 mg total) by mouth daily.  90 tablet  3  . gabapentin (NEURONTIN) 300 MG capsule Take 1 capsule (300 mg total) by mouth 2 (two) times daily.  60 capsule  11  . glucose blood test strip Use as instructed  50 each  99  . halobetasol (ULTRAVATE) 0.05 % cream Apply topically 2 (two) times daily.        Marland Kitchen HYDROcodone-acetaminophen (NORCO) 7.5-325 MG per tablet Take 1 tablet by mouth every 6 (six) hours as needed.  100 tablet  1  . insulin aspart protamine-insulin aspart (NOVOLOG 70/30) (70-30) 100 UNIT/ML injection 10 units with breakfast, and 6 units  with the evening meal      . Insulin Pen Needle (PEN NEEDLES) 31G X 6 MM MISC by Does not apply route.        Marland Kitchen losartan (COZAAR) 100 MG tablet Take 1 tablet (100 mg total) by mouth daily.  90 tablet  3  . Pyridoxine HCl (VITAMIN B-6 PO) Take by mouth daily.      . simvastatin (ZOCOR) 40 MG tablet TAKE 1 TABLET BY MOUTH EVERY EVENING  30 tablet  6  . Thiamine HCl (VITAMIN B-1) 100 MG tablet Take 100 mg by mouth daily.        . vitamin E 1000 UNIT capsule Take 1,000 Units by mouth daily.        Marland Kitchen allopurinol (ZYLOPRIM) 100 MG tablet Take 100 mg by mouth daily as needed.      . metoprolol succinate (TOPROL-XL) 25 MG 24 hr tablet Take 25 mg by mouth daily. 1-2 TIMES DAILY        ROS- all systems are reviewed and negative except as per HPI  Physical Exam: Filed Vitals:   07/03/11 0937  BP: 130/70  Pulse: 68  Resp: 18  Height: 5\' 8"  (1.727 m)  Weight: 194 lb (87.998 kg)     GEN- The patient is well appearing, alert and oriented x 3 today.   Head- normocephalic, atraumatic Eyes-  Sclera clear, conjunctiva pink Ears- hearing intact Oropharynx- clear Neck- supple, no JVP Lymph- no cervical lymphadenopathy Lungs- Clear to ausculation bilaterally, normal work of breathing Chest- ICD pocket is well healed Heart- Regular rate and rhythm, no murmurs, rubs or gallops, PMI not laterally displaced GI- soft, NT, ND, + BS Extremities- no clubbing, cyanosis, or edema MS- no significant deformity or atrophy Skin- no rash or lesion Psych- euthymic mood, full affect Neuro- strength and sensation are intact  ICD interrogation- reviewed in detail today,  See PACEART report  Assessment and Plan:

## 2011-07-03 NOTE — Assessment & Plan Note (Signed)
Intermittent muscle weakness could be related to his statin. Check CK and ESR today. Will not change medicine If symptoms recur, he should alert Dr Clent Ridges.

## 2011-07-03 NOTE — Progress Notes (Signed)
Addended by: Rozalyn Osland K on: 07/03/2011 10:38 AM   Modules accepted: Orders  

## 2011-07-03 NOTE — Patient Instructions (Addendum)
Remote monitoring is used to monitor your Pacemaker of ICD from home. This monitoring reduces the number of office visits required to check your device to one time per year. It allows [place] to keep an eye on the functioning of your device to ensure it is working properly. You are scheduled for a device check from home on October 03, 2011. You may send your transmission at any time that day. If you have a wireless device, the transmission will be sent automatically. After your physician reviews your transmission, you will receive a postcard with your next transmission date.  Your physician wants you to follow-up in: 1 year with Dr Johney Frame.  You will receive a reminder letter in the mail two months in advance. If you don't receive a letter, please call our office to schedule the follow-up appointment.  Your physician recommends that you return for lab work today

## 2011-07-03 NOTE — Assessment & Plan Note (Signed)
No CHF on exam.   His BiV ICD is functioning normally.  He is > 92 % biv paced.   Normal BiV ICD function See Pace Art report NID adjusted to prevent inappropriate therapies

## 2011-07-09 ENCOUNTER — Telehealth: Payer: Self-pay | Admitting: Family Medicine

## 2011-07-09 MED ORDER — FINASTERIDE 5 MG PO TABS: 5.0000 mg | ORAL_TABLET | Freq: Every day | ORAL | Status: DC

## 2011-07-09 NOTE — Telephone Encounter (Signed)
Script for Finasteride sent e-scribe to [facility].

## 2011-07-15 ENCOUNTER — Telehealth: Payer: Self-pay | Admitting: Internal Medicine

## 2011-07-15 NOTE — Telephone Encounter (Signed)
Please return call to [facility] [phone] regarding CKMB Order

## 2011-07-15 NOTE — Telephone Encounter (Signed)
CANCEL ORDER PER KEITH

## 2011-07-24 ENCOUNTER — Ambulatory Visit (INDEPENDENT_AMBULATORY_CARE_PROVIDER_SITE_OTHER): Payer: Medicare Other | Admitting: Family Medicine

## 2011-07-24 ENCOUNTER — Encounter: Payer: Self-pay | Admitting: Family Medicine

## 2011-07-24 VITALS — BP 122/80 | HR 81 | Temp 98.7°F | Wt 190.0 lb

## 2011-07-24 DIAGNOSIS — N62 Hypertrophy of breast: Secondary | ICD-10-CM

## 2011-07-24 DIAGNOSIS — E785 Hyperlipidemia, unspecified: Secondary | ICD-10-CM

## 2011-07-24 DIAGNOSIS — R5383 Other fatigue: Secondary | ICD-10-CM

## 2011-07-24 DIAGNOSIS — R531 Weakness: Secondary | ICD-10-CM

## 2011-07-24 NOTE — Progress Notes (Signed)
  Subjective:    Patient ID: [name], male    DOB: September 16, 1934, 77 y.o.   MRN: [id number]  HPI Here for several problems. First for the past month or so he has had dull aching pains in both legs and both arms. He also has weakness in the arms and legs. He gets tired just wlaking across the room. No SOB or chest pains. He saw Dr. Johney Frame a few weeks ago, and they entertained the possibility of this being a side effect of Zocor. He had a total CK drawn which was normal. His meds were not changed. Also, he has had swelling and discomfort of the left breast for several months. He has been on Proscar for several years.    Review of Systems  Constitutional: Positive for fatigue.  Respiratory: Negative.   Cardiovascular: Negative.   Gastrointestinal: Negative.   Musculoskeletal: Positive for myalgias.  Neurological: Positive for weakness.       Objective:   Physical Exam  Constitutional: He is oriented to person, place, and time. He appears well-developed and well-nourished.  Cardiovascular: Normal rate, regular rhythm, normal heart sounds and intact distal pulses.   Pulmonary/Chest: Effort normal and breath sounds normal.       The left breast is slightly swollen and tender, no masses felt   Neurological: He is alert and oriented to person, place, and time.          Assessment & Plan:  His muscle weakness and soreness are probably sided effects of his statin, so I advised him to stop the Zocor for one month. He has some gynecomastia which is probably a side effect of his Proscar, so he will stop this for one month as well. Recheck in one month

## 2011-08-28 ENCOUNTER — Ambulatory Visit (INDEPENDENT_AMBULATORY_CARE_PROVIDER_SITE_OTHER): Payer: Medicare Other | Admitting: Family Medicine

## 2011-08-28 ENCOUNTER — Encounter: Payer: Self-pay | Admitting: Family Medicine

## 2011-08-28 VITALS — BP 124/68 | HR 87 | Temp 98.3°F | Wt 186.0 lb

## 2011-08-28 DIAGNOSIS — R531 Weakness: Secondary | ICD-10-CM

## 2011-08-28 DIAGNOSIS — Z79899 Other long term (current) drug therapy: Secondary | ICD-10-CM

## 2011-08-28 DIAGNOSIS — D72829 Elevated white blood cell count, unspecified: Secondary | ICD-10-CM

## 2011-08-28 DIAGNOSIS — R5381 Other malaise: Secondary | ICD-10-CM

## 2011-08-28 DIAGNOSIS — I739 Peripheral vascular disease, unspecified: Secondary | ICD-10-CM

## 2011-08-28 LAB — CBC WITH DIFFERENTIAL/PLATELET
Basophils Absolute: 0 10*3/uL (ref 0.0–0.1)
Lymphocytes Relative: 8.4 % — ABNORMAL LOW (ref 12.0–46.0)
Monocytes Relative: 6 % (ref 3.0–12.0)
Neutrophils Relative %: 84.8 % — ABNORMAL HIGH (ref 43.0–77.0)
Platelets: 177 10*3/uL (ref 150.0–400.0)
RDW: 14 % (ref 11.5–14.6)

## 2011-08-28 LAB — TSH: TSH: 1.22 u[IU]/mL (ref 0.35–5.50)

## 2011-08-28 LAB — BASIC METABOLIC PANEL
BUN: 44 mg/dL — ABNORMAL HIGH (ref 6–23)
CO2: 25 mEq/L (ref 19–32)
Chloride: 100 mEq/L (ref 96–112)
GFR: 29.28 mL/min — ABNORMAL LOW (ref >60.00)
Glucose, Bld: 151 mg/dL — ABNORMAL HIGH (ref 70–99)
Potassium: 4.1 mEq/L (ref 3.5–5.1)

## 2011-08-28 LAB — HEPATIC FUNCTION PANEL
ALT: 13 U/L (ref 0–53)
AST: 20 U/L (ref 0–37)
Albumin: 4.1 g/dL (ref 3.5–5.2)
Total Protein: 6.8 g/dL (ref 6.0–8.3)

## 2011-08-28 LAB — VITAMIN B12: Vitamin B-12: 396 pg/mL (ref 211–911)

## 2011-08-28 MED ORDER — CARVEDILOL 12.5 MG PO TABS: 6.2500 mg | ORAL_TABLET | Freq: Two times a day (BID) | ORAL | Status: DC

## 2011-08-28 NOTE — Progress Notes (Signed)
  Subjective:    Patient ID: [name], male    DOB: Jan 06, 1935, 77 y.o.   MRN: [id number]  HPI Here to follow up on generalized weakness and fatigue, and to talk about bilateral leg pains. We met a month ago, and we thought this may be side effects of meds, so he stopped taking Zocor and finasteride. This has not helped at all, and he feels as bad as ever. Also he has developed shooting pains in both legs and feet which [place] worse when he is up walking around. This goes away quickly whenever he sits down.    Review of Systems  Constitutional: Positive for fatigue.  Respiratory: Negative.   Cardiovascular: Negative.   Musculoskeletal: Positive for myalgias.       Objective:   Physical Exam  Constitutional: He appears well-developed and well-nourished. No distress.  Neck: No thyromegaly present.  Cardiovascular: Normal rate, regular rhythm and normal heart sounds.        No palpable pulses in both feet  Pulmonary/Chest: Effort normal and breath sounds normal.  Musculoskeletal: Normal range of motion. He exhibits no edema and no tenderness.       Both feet are warm, but they are red and shiny   Lymphadenopathy:    He has no cervical adenopathy.          Assessment & Plan:  Generalized weakness of uncertain etiology. He will decrease the Coreg to 1/2 pill bid. Get some labs. He may have claudication in the legs, so we will set up arterial dopplers.

## 2011-08-29 ENCOUNTER — Encounter (INDEPENDENT_AMBULATORY_CARE_PROVIDER_SITE_OTHER): Payer: Medicare Other

## 2011-08-29 ENCOUNTER — Ambulatory Visit (INDEPENDENT_AMBULATORY_CARE_PROVIDER_SITE_OTHER)
Admission: RE | Admit: 2011-08-29 | Discharge: 2011-08-29 | Disposition: A | Discharge status: Home / Self Care | Payer: Medicare Other | Source: Ambulatory Visit | Attending: Family Medicine | Admitting: Family Medicine

## 2011-08-29 DIAGNOSIS — I70219 Atherosclerosis of native arteries of extremities with intermittent claudication, unspecified extremity: Secondary | ICD-10-CM

## 2011-08-29 DIAGNOSIS — D72829 Elevated white blood cell count, unspecified: Secondary | ICD-10-CM

## 2011-08-29 DIAGNOSIS — I739 Peripheral vascular disease, unspecified: Secondary | ICD-10-CM

## 2011-08-29 LAB — POCT URINALYSIS DIPSTICK
Blood, UA: NEGATIVE
Nitrite, UA: NEGATIVE
Protein, UA: NEGATIVE
Urobilinogen, UA: 0.2

## 2011-08-29 NOTE — Progress Notes (Signed)
Quick Note:  I tried to reach pt on cell phone, no answer. I called home # and spoke with pt. I gave her the below message and she will get in touch with him. ______

## 2011-08-29 NOTE — Addendum Note (Signed)
Addended by: Gershon Crane A on: 08/29/2011 08:44 AM   Modules accepted: Orders

## 2011-08-30 ENCOUNTER — Telehealth: Payer: Self-pay | Admitting: Family Medicine

## 2011-08-30 DIAGNOSIS — I739 Peripheral vascular disease, unspecified: Secondary | ICD-10-CM

## 2011-08-30 NOTE — Telephone Encounter (Signed)
Pt wife called that ultrasound showed blockage 100% in left groin and 55-75% closed on rt. Pt req call back asap today. Pt is pain. Feet purple.

## 2011-08-30 NOTE — Progress Notes (Signed)
Quick Note:  I spoke with pt's wife. ______ 

## 2011-08-30 NOTE — Telephone Encounter (Signed)
He has significant arterial disease by arterial dopplers of both legs done this morning at [facility]

## 2011-08-30 NOTE — Telephone Encounter (Signed)
Caller: [name]/Patient; Phone Number: [phone]; Message from caller: Pt calling for a third time waiting to hear from physician concerning results and what the plan will be be based on results.  Informed pt that previous two messages have been sent and provider will be responding to messages.

## 2011-08-30 NOTE — Telephone Encounter (Signed)
Patient calling requesting results and disposition from labs studies and Ultrasound.  Complaining of Leg discomfort.  He states that he is afraid he needs an intervention or admission and does not want to leave town or make plans.  Would please like someone to update him on studies that were done yesterday.  Please call at home [phone].

## 2011-09-02 ENCOUNTER — Ambulatory Visit: Payer: Medicare Other | Admitting: Nurse Practitioner

## 2011-09-04 ENCOUNTER — Encounter (HOSPITAL_COMMUNITY): Payer: Self-pay | Admitting: Pharmacy Technician

## 2011-09-04 ENCOUNTER — Ambulatory Visit (INDEPENDENT_AMBULATORY_CARE_PROVIDER_SITE_OTHER): Payer: Medicare Other | Admitting: Cardiovascular Disease

## 2011-09-04 ENCOUNTER — Encounter: Payer: Self-pay | Admitting: Cardiovascular Disease

## 2011-09-04 VITALS — BP 130/74 | HR 75 | Ht 68.0 in | Wt 187.0 lb

## 2011-09-04 DIAGNOSIS — I2510 Atherosclerotic heart disease of native coronary artery without angina pectoris: Secondary | ICD-10-CM | POA: Insufficient documentation

## 2011-09-04 DIAGNOSIS — I4891 Unspecified atrial fibrillation: Secondary | ICD-10-CM

## 2011-09-04 DIAGNOSIS — I739 Peripheral vascular disease, unspecified: Secondary | ICD-10-CM

## 2011-09-04 DIAGNOSIS — Z01812 Encounter for preprocedural laboratory examination: Secondary | ICD-10-CM

## 2011-09-04 NOTE — Assessment & Plan Note (Addendum)
[name] has evidence of extensive peripheral arterial disease. There is evidence of left subclavian stenosis. He also has previous history of renal artery stenosis status post stenting. He has lifestyle limiting claudication with minimal walking worse on the left side than the right side. He had an ABI done which was mildly reduced on the right side at 0.75 and moderately to severely reduced on the left side at 0.51. Arterial duplex ultrasound showed significant stenosis in the right proximal SFA as well as total occlusion of the proximal to mid SFA with reconstitution. The patient's symptoms are currently severe. He also has clear ischemic changes on both toes worse on the left side. It is clear that some form of intervention is needed. The limiting factor is advanced chronic kidney disease with most recent creatinine of 2.3. I discussed different management options with him. I think the first step is to proceed with abdominal angiography, lower extremity runoff to see what are the revascularization options. If  angioplasty is doable, it might need to be staged to a later date. Also if he has a long area of total occlusion, then surgical revascularization might be more appropriate. We'll plan on doing angiography mostly with CO2 to minimize the contrast load. I will also admit him the night before the procedure for hydration.

## 2011-09-04 NOTE — Progress Notes (Signed)
HPI  [name] is a 77 y.o. pleasant male who is here today for evaluation of peripheral arterial disease. He has known history of coronary artery disease status post stenting many years ago. He also had renal artery stenting in the past. He has with mixed ischemic/ nonischemic CM, LBBB, NYHA Class III CHF sp BiV ICD (MDT) by Dr Amil Amen. He also has history of atrial fibrillation currently not on anticoagulation. He reports easy bruising even with being only on small dose aspirin. His primary care physician is Dr. Clent Ridges, primary cardiologist is Garnette Scheuermann and primary electrophysiologist is Dr. Johney Frame. The patient has been having significant discomfort in both legs worse on the left side. This started about 6 months ago and has been progressive since then. He gets discomfort and cramping in both calves after walking only 10-15 yards. It forces him to stop and rest for about a minute or 2 before he can resume. He had some discomfort recently at night as well at rest on the left side. He noticed purplish discoloration of both feet worse on the left side. He denies any nonhealing ulcers.    Allergies  Allergen Reactions  . Lisinopril     REACTION: hives Patient doesn't recall this     Current Outpatient Prescriptions on File Prior to Visit  Medication Sig Dispense Refill  . amLODipine (NORVASC) 10 MG tablet Take 10 mg by mouth daily.      . carvedilol (COREG) 12.5 MG tablet Take 0.5 tablets (6.25 mg total) by mouth 2 (two) times daily.  60 tablet  11  . furosemide (LASIX) 40 MG tablet Take 1 tablet (40 mg total) by mouth daily.  90 tablet  3  . gabapentin (NEURONTIN) 300 MG capsule Take 1 capsule (300 mg total) by mouth 2 (two) times daily.  60 capsule  11  . glucose blood test strip Use as instructed  50 each  99  . halobetasol (ULTRAVATE) 0.05 % cream Apply topically 2 (two) times daily.        Marland Kitchen HYDROcodone-acetaminophen (NORCO) 7.5-325 MG per tablet Take 1 tablet by mouth every 6  (six) hours as needed.  100 tablet  1  . insulin aspart protamine-insulin aspart (NOVOLOG 70/30) (70-30) 100 UNIT/ML injection 10 units with breakfast, and 6 units with the evening meal      . Insulin Pen Needle (PEN NEEDLES) 31G X 6 MM MISC by Does not apply route.        Marland Kitchen losartan (COZAAR) 100 MG tablet Take 1 tablet (100 mg total) by mouth daily.  90 tablet  3  . Pyridoxine HCl (VITAMIN B-6 PO) Take by mouth daily.      . Thiamine HCl (VITAMIN B-1) 100 MG tablet Take 100 mg by mouth daily.        . vitamin E 1000 UNIT capsule Take 1,000 Units by mouth daily.           Past Medical History  Diagnosis Date  . Hyperlipidemia   . Hypertension   . Gout   . ED (erectile dysfunction)   . Benign prostatic hypertrophy   . Atrial fibrillation     sees Dr. Verdis Prime  . Chronic systolic dysfunction of left ventricle     mixed ischemic and nonischemic CM,  EF 35%  . Diabetes mellitus     sees Dr. Everardo All   . CAD (coronary artery disease)     s/p stenting  . Renal artery stenosis  s/p stenting     Past Surgical History  Procedure Date  . Turp vaporization   . Cardiac defibrillator placement 12/26/09  . Cervical epidural injection 2013  . Cardiac catheterization   . Coronary angioplasty      Family History  Problem Relation Age of Onset  . Cancer      breast/fhx  . Heart disease      fhx  . Diabetes Neg Hx   . Cancer Father      History   Social History  . Marital Status: Widowed    Spouse Name: N/A    Number of Children: N/A  . Years of Education: N/A   Occupational History  . Retired    Social History Main Topics  . Smoking status: Current Some Day Smoker    Types: Cigars  . Smokeless tobacco: Never Used   Comment: 1-2 cigars when golfing  . Alcohol Use: 1.5 oz/week    3 drink(s) per week  . Drug Use: No  . Sexually Active: Not on file   Other Topics Concern  . Not on file   Social History Narrative   Lives in [place] with significant other,   [name].     ROS Constitutional: Negative for fever, chills, diaphoresis, activity change, appetite change and fatigue.  HENT: Negative for hearing loss, nosebleeds, congestion, sore throat, facial swelling, drooling, trouble swallowing, neck pain, voice change, sinus pressure and tinnitus.  Eyes: Negative for photophobia, pain, discharge and visual disturbance.  Respiratory: Negative for apnea, cough, chest tightness, shortness of breath and wheezing.  Cardiovascular: Negative for chest pain, palpitations and leg swelling.  Gastrointestinal: Negative for nausea, vomiting, abdominal pain, diarrhea, constipation, blood in stool and abdominal distention.  Genitourinary: Negative for dysuria, urgency, frequency, hematuria and decreased urine volume.  Skin: Negative for  rash and wound.  Neurological: Negative for dizziness, tremors, seizures, syncope, speech difficulty, weakness, light-headedness, numbness and headaches.  Psychiatric/Behavioral: Negative for suicidal ideas, hallucinations, behavioral problems and agitation. The patient is not nervous/anxious.     PHYSICAL EXAM   BP 130/74  Pulse 75  Ht 5\' 8"  (1.727 m)  Wt 187 lb (84.823 kg)  BMI 28.43 kg/m2 Constitutional: He is oriented to person, place, and time. He appears well-developed and well-nourished. No distress.  HENT: No nasal discharge.  Head: Normocephalic and atraumatic.  Eyes: Pupils are equal and round. Right eye exhibits no discharge. Left eye exhibits no discharge.  Neck: Normal range of motion. Neck supple. No JVD present. No thyromegaly present. No carotid bruits. Cardiovascular: Normal rate, regular rhythm, normal heart sounds and. Exam reveals no gallop and no friction rub. No murmur heard.  Pulmonary/Chest: Effort normal and breath sounds normal. No stridor. No respiratory distress. He has no wheezes. He has no rales. He exhibits no tenderness.  Abdominal: Soft. Bowel sounds are normal. He exhibits no distension.  There is no tenderness. There is no rebound and no guarding.  Musculoskeletal: Normal range of motion. He exhibits no edema and no tenderness.  Neurological: He is alert and oriented to person, place, and time. Coordination normal.  Skin: Skin is warm and dry. No rash noted. He is not diaphoretic. No erythema. No pallor.  Psychiatric: He has a normal mood and affect. His behavior is normal. Judgment and thought content normal.  Vascular: Femoral pulses: +1 on the right side and +2 on the left side. PT/DP: Not palpable bilaterally. There is purple discoloration of both distal toes worse on the left side.  EKG: Electronic ventricular pacemaker. Underlying rhythm is likely atrial fibrillation.   ASSESSMENT AND PLAN

## 2011-09-04 NOTE — Patient Instructions (Addendum)
Your procedure is scheduled for 09/11/11 at 12:00 noon  Your physician recommends that you return for lab work in: Today (cbc with diff, bmet, pt/inr)  Your physician recommends that you continue on your current medications as directed. Please refer to the Current Medication list given to you today.

## 2011-09-06 ENCOUNTER — Ambulatory Visit (INDEPENDENT_AMBULATORY_CARE_PROVIDER_SITE_OTHER): Payer: Medicare Other | Admitting: Endocrinology

## 2011-09-06 ENCOUNTER — Encounter: Payer: Self-pay | Admitting: Endocrinology

## 2011-09-06 VITALS — BP 110/70 | HR 73 | Temp 98.2°F | Ht 68.0 in | Wt 185.0 lb

## 2011-09-06 DIAGNOSIS — I798 Other disorders of arteries, arterioles and capillaries in diseases classified elsewhere: Secondary | ICD-10-CM

## 2011-09-06 DIAGNOSIS — E1059 Type 1 diabetes mellitus with other circulatory complications: Secondary | ICD-10-CM

## 2011-09-06 NOTE — Patient Instructions (Addendum)
check your blood sugar 2 times a day.  vary the time of day when you check, between before the 3 meals, and at bedtime.  also check if you have symptoms of your blood sugar being too high or too low.  please keep a record of the readings and bring it to your next appointment here.  please call us sooner if your blood sugar goes below 70, or if it stays over 200.   Please come back for a follow-up appointment in 3-4 months. i have ordered your diabetes blood test, so it can be done next week.

## 2011-09-06 NOTE — Progress Notes (Signed)
Subjective:    Patient ID: [name], male    DOB: [birth year], 77 y.o.   MRN: [id number]  HPI Pt returns for f/u of insulin-requiring DM (dx'ed 2010; complicated by renal insuff, peripheral sensory neuropathy, and CAD).  He does not want to take insulin any more often than bid.  He will have PAD procedure next week. no cbg record, but states cbg's are well-controlled Past Medical History  Diagnosis Date  . Hyperlipidemia   . Hypertension   . Gout   . ED (erectile dysfunction)   . Benign prostatic hypertrophy   . Atrial fibrillation     sees Dr. Verdis Prime  . Chronic systolic dysfunction of left ventricle     mixed ischemic and nonischemic CM,  EF 35%  . Diabetes mellitus     sees Dr. Everardo All   . CAD (coronary artery disease)     s/p stenting  . Renal artery stenosis     s/p stenting    Past Surgical History  Procedure Date  . Turp vaporization   . Cardiac defibrillator placement 12/26/09  . Cervical epidural injection 2013  . Cardiac catheterization   . Coronary angioplasty     History   Social History  . Marital Status: Widowed    Spouse Name: N/A    Number of Children: N/A  . Years of Education: N/A   Occupational History  . Retired    Social History Main Topics  . Smoking status: Current Some Day Smoker    Types: Cigars  . Smokeless tobacco: Never Used   Comment: 1-2 cigars when golfing  . Alcohol Use: 1.5 oz/week    3 drink(s) per week  . Drug Use: No  . Sexually Active: Not on file   Other Topics Concern  . Not on file   Social History Narrative   Lives in [place] with significant other,  [name].    Current Outpatient Prescriptions on File Prior to Visit  Medication Sig Dispense Refill  . carvedilol (COREG) 12.5 MG tablet Take 0.5 tablets (6.25 mg total) by mouth 2 (two) times daily.  60 tablet  11  . furosemide (LASIX) 40 MG tablet Take 1 tablet (40 mg total) by mouth daily.  90 tablet  3  . gabapentin (NEURONTIN) 300 MG capsule Take  300 mg by mouth daily.      Marland Kitchen glucose blood test strip Use as instructed  50 each  99  . halobetasol (ULTRAVATE) 0.05 % cream Apply topically 2 (two) times daily.        Marland Kitchen HYDROcodone-acetaminophen (NORCO) 7.5-325 MG per tablet Take 1 tablet by mouth every 6 (six) hours as needed.  100 tablet  1  . insulin aspart protamine-insulin aspart (NOVOLOG 70/30) (70-30) 100 UNIT/ML injection 10 units with breakfast, and 6 units with the evening meal      . losartan (COZAAR) 100 MG tablet Take 1 tablet (100 mg total) by mouth daily.  90 tablet  3  . Pyridoxine HCl (VITAMIN B-6 PO) Take by mouth daily.      . vitamin E 1000 UNIT capsule Take 1,000 Units by mouth daily.          Allergies  Allergen Reactions  . Lisinopril     REACTION: hives Patient doesn't recall this    Family History  Problem Relation Age of Onset  . Cancer      breast/fhx  . Heart disease      fhx  . Diabetes Neg Hx   .  Cancer Father    BP 110/70  Pulse 73  Temp 98.2 F (36.8 C) (Oral)  Ht 5\' 8"  (1.727 m)  Wt 185 lb (83.915 kg)  BMI 28.13 kg/m2  SpO2 97%  Review of Systems denies hypoglycemia    Objective:   Physical Exam Pulses: dorsalis pedis absent bilat. Feet: no deformity.  no ulcer on the feet.  feet are of normal color and temp.  no edema.  There is bilateral onychomycosis. Neuro: sensation is intact to touch on the feet, but decreased from normal.    Assessment & Plan:  DM.  this is the best control this pt should aim for, given this regimen, which does match insulin to his changing needs throughout the day

## 2011-09-10 ENCOUNTER — Observation Stay (HOSPITAL_COMMUNITY)
Admission: AD | Admit: 2011-09-10 | Discharge: 2011-09-12 | Disposition: A | Discharge status: Home / Self Care | Payer: Medicare Other | Source: Ambulatory Visit | Attending: Cardiovascular Disease | Admitting: Cardiovascular Disease

## 2011-09-10 ENCOUNTER — Encounter (HOSPITAL_COMMUNITY): Payer: Self-pay | Admitting: General Practice

## 2011-09-10 DIAGNOSIS — G4730 Sleep apnea, unspecified: Secondary | ICD-10-CM | POA: Insufficient documentation

## 2011-09-10 DIAGNOSIS — N183 Chronic kidney disease, stage 3 unspecified: Secondary | ICD-10-CM | POA: Insufficient documentation

## 2011-09-10 DIAGNOSIS — I4891 Unspecified atrial fibrillation: Secondary | ICD-10-CM | POA: Insufficient documentation

## 2011-09-10 DIAGNOSIS — I70219 Atherosclerosis of native arteries of extremities with intermittent claudication, unspecified extremity: Principal | ICD-10-CM | POA: Insufficient documentation

## 2011-09-10 DIAGNOSIS — I509 Heart failure, unspecified: Secondary | ICD-10-CM | POA: Insufficient documentation

## 2011-09-10 DIAGNOSIS — E785 Hyperlipidemia, unspecified: Secondary | ICD-10-CM | POA: Insufficient documentation

## 2011-09-10 DIAGNOSIS — I739 Peripheral vascular disease, unspecified: Secondary | ICD-10-CM

## 2011-09-10 DIAGNOSIS — E119 Type 2 diabetes mellitus without complications: Secondary | ICD-10-CM | POA: Insufficient documentation

## 2011-09-10 DIAGNOSIS — I7092 Chronic total occlusion of artery of the extremities: Secondary | ICD-10-CM | POA: Insufficient documentation

## 2011-09-10 DIAGNOSIS — I447 Left bundle-branch block, unspecified: Secondary | ICD-10-CM | POA: Insufficient documentation

## 2011-09-10 DIAGNOSIS — I428 Other cardiomyopathies: Secondary | ICD-10-CM | POA: Insufficient documentation

## 2011-09-10 DIAGNOSIS — I129 Hypertensive chronic kidney disease with stage 1 through stage 4 chronic kidney disease, or unspecified chronic kidney disease: Secondary | ICD-10-CM | POA: Insufficient documentation

## 2011-09-10 DIAGNOSIS — Z95810 Presence of automatic (implantable) cardiac defibrillator: Secondary | ICD-10-CM | POA: Insufficient documentation

## 2011-09-10 DIAGNOSIS — I5022 Chronic systolic (congestive) heart failure: Secondary | ICD-10-CM | POA: Insufficient documentation

## 2011-09-10 HISTORY — DX: Sleep apnea, unspecified: G47.30

## 2011-09-10 HISTORY — DX: Peripheral vascular disease, unspecified: I73.9

## 2011-09-10 LAB — CREATININE, SERUM
Creatinine, Ser: 2.41 mg/dL — ABNORMAL HIGH (ref 0.50–1.35)
GFR calc Af Amer: 29 mL/min — ABNORMAL LOW (ref >90)

## 2011-09-10 LAB — GLUCOSE, CAPILLARY
Glucose-Capillary: 116 mg/dL — ABNORMAL HIGH (ref 70–99)
Glucose-Capillary: 59 mg/dL — ABNORMAL LOW (ref 70–99)

## 2011-09-10 LAB — CBC
MCV: 85.1 fL (ref 78.0–100.0)
Platelets: 157 10*3/uL (ref 150–400)
RBC: 4.98 MIL/uL (ref 4.22–5.81)
WBC: 6.9 10*3/uL (ref 4.0–10.5)

## 2011-09-10 MED ORDER — CARVEDILOL 6.25 MG PO TABS
6.2500 mg | ORAL_TABLET | Freq: Two times a day (BID) | ORAL | Status: DC
  Administered 2011-09-10 – 2011-09-12 (×4): 6.25 mg via ORAL
  Filled 2011-09-10 (×6): qty 1

## 2011-09-10 MED ORDER — SODIUM CHLORIDE 0.9 % IV SOLN: 250.0000 mL | INTRAVENOUS | Status: DC | PRN

## 2011-09-10 MED ORDER — HEPARIN SODIUM (PORCINE) 5000 UNIT/ML IJ SOLN
5000.0000 [IU] | Freq: Three times a day (TID) | INTRAMUSCULAR | Status: DC
  Administered 2011-09-10 – 2011-09-12 (×6): 5000 [IU] via SUBCUTANEOUS
  Filled 2011-09-10 (×9): qty 1

## 2011-09-10 MED ORDER — SODIUM CHLORIDE 0.9 % IJ SOLN: 3.0000 mL | INTRAMUSCULAR | Status: DC | PRN

## 2011-09-10 MED ORDER — INSULIN ASPART PROT & ASPART (70-30 MIX) 100 UNIT/ML ~~LOC~~ SUSP
10.0000 [IU] | Freq: Every day | SUBCUTANEOUS | Status: DC
  Administered 2011-09-12: 10 [IU] via SUBCUTANEOUS

## 2011-09-10 MED ORDER — CLOBETASOL PROPIONATE 0.05 % EX CREA
TOPICAL_CREAM | Freq: Two times a day (BID) | CUTANEOUS | Status: DC
  Filled 2011-09-10: qty 15

## 2011-09-10 MED ORDER — GABAPENTIN 300 MG PO CAPS
300.0000 mg | ORAL_CAPSULE | Freq: Every day | ORAL | Status: DC
  Administered 2011-09-10 – 2011-09-12 (×3): 300 mg via ORAL
  Filled 2011-09-10 (×3): qty 1

## 2011-09-10 MED ORDER — ASPIRIN 81 MG PO CHEW
324.0000 mg | CHEWABLE_TABLET | ORAL | Status: AC
  Administered 2011-09-11: 324 mg via ORAL
  Filled 2011-09-10: qty 4

## 2011-09-10 MED ORDER — INSULIN ASPART PROT & ASPART (70-30 MIX) 100 UNIT/ML ~~LOC~~ SUSP
6.0000 [IU] | Freq: Every day | SUBCUTANEOUS | Status: DC
  Administered 2011-09-10 – 2011-09-11 (×2): 6 [IU] via SUBCUTANEOUS
  Filled 2011-09-10: qty 3

## 2011-09-10 MED ORDER — ACETAMINOPHEN 325 MG PO TABS
650.0000 mg | ORAL_TABLET | ORAL | Status: DC | PRN
  Administered 2011-09-12: 650 mg via ORAL
  Filled 2011-09-10: qty 2

## 2011-09-10 MED ORDER — ZOLPIDEM TARTRATE 5 MG PO TABS
5.0000 mg | ORAL_TABLET | Freq: Every evening | ORAL | Status: DC | PRN
  Administered 2011-09-10 – 2011-09-11 (×2): 5 mg via ORAL
  Filled 2011-09-10 (×2): qty 1

## 2011-09-10 MED ORDER — VITAMIN E 45 MG (100 UNIT) PO CAPS
1000.0000 [IU] | ORAL_CAPSULE | Freq: Every day | ORAL | Status: DC
  Administered 2011-09-11: 1000 [IU] via ORAL
  Filled 2011-09-10 (×3): qty 2

## 2011-09-10 MED ORDER — INSULIN ASPART 100 UNIT/ML ~~LOC~~ SOLN: 0.0000 [IU] | Freq: Three times a day (TID) | SUBCUTANEOUS | Status: DC

## 2011-09-10 MED ORDER — INSULIN ASPART PROT & ASPART (70-30 MIX) 100 UNIT/ML ~~LOC~~ SUSP: 10.0000 [IU] | Freq: Every day | SUBCUTANEOUS | Status: DC

## 2011-09-10 MED ORDER — HYDROCODONE-ACETAMINOPHEN 7.5-325 MG PO TABS
1.0000 | ORAL_TABLET | Freq: Four times a day (QID) | ORAL | Status: DC | PRN
  Administered 2011-09-10: 1 via ORAL
  Filled 2011-09-10: qty 1

## 2011-09-10 MED ORDER — ONDANSETRON HCL 4 MG/2ML IJ SOLN: 4.0000 mg | Freq: Four times a day (QID) | INTRAMUSCULAR | Status: DC | PRN

## 2011-09-10 MED ORDER — SODIUM CHLORIDE 0.9 % IJ SOLN: 3.0000 mL | Freq: Two times a day (BID) | INTRAMUSCULAR | Status: DC

## 2011-09-10 MED ORDER — HALOBETASOL PROPIONATE 0.05 % EX CREA: TOPICAL_CREAM | Freq: Two times a day (BID) | CUTANEOUS | Status: DC

## 2011-09-10 MED ORDER — SODIUM CHLORIDE 0.9 % IV SOLN
INTRAVENOUS | Status: DC
  Administered 2011-09-10: 18:00:00 via INTRAVENOUS

## 2011-09-10 NOTE — Progress Notes (Signed)
Patient seen and examined. Admitted for overnight hydration for abdominal angiogram with runoff tomorrow. Baseline creatinine of 2.4. See complete H&P by Dr. Kirke Corin on 09/06/11. Orders entered. Patient's questions answered.  Jonathan Mcilhenny [place] MD, [facility]  09/10/2011

## 2011-09-11 ENCOUNTER — Encounter (HOSPITAL_COMMUNITY)
Admission: AD | Disposition: A | Discharge status: Home / Self Care | Payer: Self-pay | Source: Ambulatory Visit | Attending: Cardiovascular Disease

## 2011-09-11 DIAGNOSIS — Z01810 Encounter for preprocedural cardiovascular examination: Secondary | ICD-10-CM

## 2011-09-11 DIAGNOSIS — I70219 Atherosclerosis of native arteries of extremities with intermittent claudication, unspecified extremity: Secondary | ICD-10-CM

## 2011-09-11 HISTORY — PX: ABDOMINAL AORTAGRAM: SHX5454

## 2011-09-11 LAB — CBC
HCT: 43.2 % (ref 39.0–52.0)
Hemoglobin: 14.7 g/dL (ref 13.0–17.0)
MCV: 86.1 fL (ref 78.0–100.0)
RBC: 5.02 MIL/uL (ref 4.22–5.81)
RDW: 13.5 % (ref 11.5–15.5)
WBC: 6.3 10*3/uL (ref 4.0–10.5)

## 2011-09-11 LAB — BASIC METABOLIC PANEL
CO2: 25 mEq/L (ref 19–32)
GFR calc non Af Amer: 25 mL/min — ABNORMAL LOW (ref >90)
Glucose, Bld: 83 mg/dL (ref 70–99)
Potassium: 4.2 mEq/L (ref 3.5–5.1)
Sodium: 140 mEq/L (ref 135–145)

## 2011-09-11 LAB — GLUCOSE, CAPILLARY
Glucose-Capillary: 75 mg/dL (ref 70–99)
Glucose-Capillary: 95 mg/dL (ref 70–99)
Glucose-Capillary: 96 mg/dL (ref 70–99)

## 2011-09-11 LAB — PROTIME-INR
INR: 1.07 (ref 0.00–1.49)
Prothrombin Time: 14.1 seconds (ref 11.6–15.2)

## 2011-09-11 LAB — CREATININE, SERUM
GFR calc Af Amer: 35 mL/min — ABNORMAL LOW (ref >90)
GFR calc non Af Amer: 30 mL/min — ABNORMAL LOW (ref >90)

## 2011-09-11 SURGERY — ABDOMINAL AORTAGRAM
Anesthesia: LOCAL

## 2011-09-11 MED ORDER — LIDOCAINE HCL (PF) 1 % IJ SOLN
INTRAMUSCULAR | Status: AC
  Filled 2011-09-11: qty 30

## 2011-09-11 MED ORDER — MIDAZOLAM HCL 2 MG/2ML IJ SOLN
INTRAMUSCULAR | Status: AC
  Filled 2011-09-11: qty 2

## 2011-09-11 MED ORDER — ASPIRIN 81 MG PO CHEW
81.0000 mg | CHEWABLE_TABLET | Freq: Every day | ORAL | Status: DC
  Administered 2011-09-11 – 2011-09-12 (×2): 81 mg via ORAL
  Filled 2011-09-11 (×2): qty 1

## 2011-09-11 MED ORDER — HEPARIN (PORCINE) IN NACL 2-0.9 UNIT/ML-% IJ SOLN
INTRAMUSCULAR | Status: AC
  Filled 2011-09-11: qty 1000

## 2011-09-11 MED ORDER — MORPHINE SULFATE 2 MG/ML IJ SOLN: 2.0000 mg | INTRAMUSCULAR | Status: DC | PRN

## 2011-09-11 MED ORDER — FENTANYL CITRATE 0.05 MG/ML IJ SOLN
INTRAMUSCULAR | Status: AC
  Filled 2011-09-11: qty 2

## 2011-09-11 MED ORDER — SODIUM CHLORIDE 0.9 % IV SOLN
INTRAVENOUS | Status: AC
  Administered 2011-09-11: 100 mL/h via INTRAVENOUS
  Administered 2011-09-12: 06:00:00 via INTRAVENOUS

## 2011-09-11 NOTE — H&P (View-Only) (Signed)
  HPI  [name] is a 77 y.o. pleasant male who is here today for evaluation of peripheral arterial disease. He has known history of coronary artery disease status post stenting many years ago. He also had renal artery stenting in the past. He has with mixed ischemic/ nonischemic CM, LBBB, NYHA Class III CHF sp BiV ICD (MDT) by Dr Edmunds. He also has history of atrial fibrillation currently not on anticoagulation. He reports easy bruising even with being only on small dose aspirin. His primary care physician is Dr. Fry, primary cardiologist is Hank Smith and primary electrophysiologist is Dr. Allred. The patient has been having significant discomfort in both legs worse on the left side. This started about 6 months ago and has been progressive since then. He gets discomfort and cramping in both calves after walking only 10-15 yards. It forces him to stop and rest for about a minute or 2 before he can resume. He had some discomfort recently at night as well at rest on the left side. He noticed purplish discoloration of both feet worse on the left side. He denies any nonhealing ulcers.    Allergies  Allergen Reactions  . Lisinopril     REACTION: hives Patient doesn't recall this     Current Outpatient Prescriptions on File Prior to Visit  Medication Sig Dispense Refill  . amLODipine (NORVASC) 10 MG tablet Take 10 mg by mouth daily.      . carvedilol (COREG) 12.5 MG tablet Take 0.5 tablets (6.25 mg total) by mouth 2 (two) times daily.  60 tablet  11  . furosemide (LASIX) 40 MG tablet Take 1 tablet (40 mg total) by mouth daily.  90 tablet  3  . gabapentin (NEURONTIN) 300 MG capsule Take 1 capsule (300 mg total) by mouth 2 (two) times daily.  60 capsule  11  . glucose blood test strip Use as instructed  50 each  99  . halobetasol (ULTRAVATE) 0.05 % cream Apply topically 2 (two) times daily.        . HYDROcodone-acetaminophen (NORCO) 7.5-325 MG per tablet Take 1 tablet by mouth every 6  (six) hours as needed.  100 tablet  1  . insulin aspart protamine-insulin aspart (NOVOLOG 70/30) (70-30) 100 UNIT/ML injection 10 units with breakfast, and 6 units with the evening meal      . Insulin Pen Needle (PEN NEEDLES) 31G X 6 MM MISC by Does not apply route.        . losartan (COZAAR) 100 MG tablet Take 1 tablet (100 mg total) by mouth daily.  90 tablet  3  . Pyridoxine HCl (VITAMIN B-6 PO) Take by mouth daily.      . Thiamine HCl (VITAMIN B-1) 100 MG tablet Take 100 mg by mouth daily.        . vitamin E 1000 UNIT capsule Take 1,000 Units by mouth daily.           Past Medical History  Diagnosis Date  . Hyperlipidemia   . Hypertension   . Gout   . ED (erectile dysfunction)   . Benign prostatic hypertrophy   . Atrial fibrillation     sees Dr. Henry Smith  . Chronic systolic dysfunction of left ventricle     mixed ischemic and nonischemic CM,  EF 35%  . Diabetes mellitus     sees Dr. Ellison   . CAD (coronary artery disease)     s/p stenting  . Renal artery stenosis       s/p stenting     Past Surgical History  Procedure Date  . Turp vaporization   . Cardiac defibrillator placement 12/26/09  . Cervical epidural injection 2013  . Cardiac catheterization   . Coronary angioplasty      Family History  Problem Relation Age of Onset  . Cancer      breast/fhx  . Heart disease      fhx  . Diabetes Neg Hx   . Cancer Father      History   Social History  . Marital Status: Widowed    Spouse Name: N/A    Number of Children: N/A  . Years of Education: N/A   Occupational History  . Retired    Social History Main Topics  . Smoking status: Current Some Day Smoker    Types: Cigars  . Smokeless tobacco: Never Used   Comment: 1-2 cigars when golfing  . Alcohol Use: 1.5 oz/week    3 drink(s) per week  . Drug Use: No  . Sexually Active: Not on file   Other Topics Concern  . Not on file   Social History Narrative   Lives in [place] with significant other,   [name].     ROS Constitutional: Negative for fever, chills, diaphoresis, activity change, appetite change and fatigue.  HENT: Negative for hearing loss, nosebleeds, congestion, sore throat, facial swelling, drooling, trouble swallowing, neck pain, voice change, sinus pressure and tinnitus.  Eyes: Negative for photophobia, pain, discharge and visual disturbance.  Respiratory: Negative for apnea, cough, chest tightness, shortness of breath and wheezing.  Cardiovascular: Negative for chest pain, palpitations and leg swelling.  Gastrointestinal: Negative for nausea, vomiting, abdominal pain, diarrhea, constipation, blood in stool and abdominal distention.  Genitourinary: Negative for dysuria, urgency, frequency, hematuria and decreased urine volume.  Skin: Negative for  rash and wound.  Neurological: Negative for dizziness, tremors, seizures, syncope, speech difficulty, weakness, light-headedness, numbness and headaches.  Psychiatric/Behavioral: Negative for suicidal ideas, hallucinations, behavioral problems and agitation. The patient is not nervous/anxious.     PHYSICAL EXAM   BP 130/74  Pulse 75  Ht 5' 8" (1.727 m)  Wt 187 lb (84.823 kg)  BMI 28.43 kg/m2 Constitutional: He is oriented to person, place, and time. He appears well-developed and well-nourished. No distress.  HENT: No nasal discharge.  Head: Normocephalic and atraumatic.  Eyes: Pupils are equal and round. Right eye exhibits no discharge. Left eye exhibits no discharge.  Neck: Normal range of motion. Neck supple. No JVD present. No thyromegaly present. No carotid bruits. Cardiovascular: Normal rate, regular rhythm, normal heart sounds and. Exam reveals no gallop and no friction rub. No murmur heard.  Pulmonary/Chest: Effort normal and breath sounds normal. No stridor. No respiratory distress. He has no wheezes. He has no rales. He exhibits no tenderness.  Abdominal: Soft. Bowel sounds are normal. He exhibits no distension.  There is no tenderness. There is no rebound and no guarding.  Musculoskeletal: Normal range of motion. He exhibits no edema and no tenderness.  Neurological: He is alert and oriented to person, place, and time. Coordination normal.  Skin: Skin is warm and dry. No rash noted. He is not diaphoretic. No erythema. No pallor.  Psychiatric: He has a normal mood and affect. His behavior is normal. Judgment and thought content normal.  Vascular: Femoral pulses: +1 on the right side and +2 on the left side. PT/DP: Not palpable bilaterally. There is purple discoloration of both distal toes worse on the left side.       EKG: Electronic ventricular pacemaker. Underlying rhythm is likely atrial fibrillation.   ASSESSMENT AND PLAN   

## 2011-09-11 NOTE — Progress Notes (Signed)
To MD and Cardiologist on rounds, pt is requesting  for Dr. Sherilyn Cooter Smith,Cardiologist to be updated of his admission. Dr. Katrinka Blazing per pt is his cardiologist. Also pt has a current  creatinine level of 2.39. Pt is ? Expected to have Angiogram? Pt is NPO  And a CBG = 75. Thank you Ancil Linsey RN

## 2011-09-11 NOTE — Progress Notes (Signed)
CBG: 66  Treatment: 15 GM carbohydrate snack  Symptoms: None  Follow-up CBG: Time:2203 CBG Result:153  Possible Reasons for Event: Unknown  Comments/MD notified:    Delene Loll

## 2011-09-11 NOTE — Consult Note (Signed)
[facility] [place] CONSULT NOTE 09/11/2011 DOB: [id number] MRN : [id number]  CC: LLE worsening claudication Referring Physician: Dr. Kirke Corin  History of Present Illness: [name] is a 77 y.o. male intermittent smoker with hx of DM, HTN, CAD (stented), renal artery stenosis (stented) and AICD placement who was fairly active until about 6 months ago when he began to experience pain and cramping in his left calf. Prior to this he was walking in a pool for exercise. This progressed to night pain (improved in dependent position) and claudication at 15 feet. Pt denies any nonhealing ulcers in either LE. He does C/O 2nd toe pain on left with palpation or pressure.  He had a peripheral arteriogram today which showed left SFA occlusion, reconstitution at the above knee pop and occlusion of below knee pop with anterior tibial runoff and small PT. Pt has neuropathy and cannot move right toes sec to previous surgery.  Past Medical History  Diagnosis Date  . Hyperlipidemia   . Hypertension   . Gout   . ED (erectile dysfunction)   . Benign prostatic hypertrophy   . Atrial fibrillation     sees Dr. Verdis Prime  . Chronic systolic dysfunction of left ventricle     mixed ischemic and nonischemic CM,  EF 35%  . Diabetes mellitus     sees Dr. Everardo All   . CAD (coronary artery disease)     s/p stenting  . Renal artery stenosis     s/p stenting  . Pacemaker   . CHF (congestive heart failure)     Past Surgical History  Procedure Date  . Turp vaporization   . Cardiac defibrillator placement 12/26/09  . Cervical epidural injection 2013  . Cardiac catheterization   . Coronary angioplasty      ROS: [x]  Positive  [ ]  Denies    General: [ ]  Weight loss, [ ]  Fever, [ ]  chills Neurologic: [ ]  Dizziness, [ ]  Blackouts, [ ]  Seizure [ ]  Stroke, [ ]  "Mini stroke", [ ]  Slurred speech, [ ]  Temporary blindness; [ ]  weakness in arms or legs, [ ]  Hoarseness Cardiac: [ ]  Chest  pain/pressure, [ ]  Shortness of breath at rest [ ]  Shortness of breath with exertion, [x ] Atrial fibrillation or irregular heartbeat [x]  pacemaker Vascular: [x ] Pain in legs with walking, [ ]  Pain in legs at rest, [x ] Pain in legs at night,  [ ]  Non-healing ulcer, [ ]  Blood clot in vein/DVT,   Pulmonary: [ ]  Home oxygen, [ ]  Productive cough, [ ]  Coughing up blood, [ ]  Asthma,  [ ]  Wheezing Musculoskeletal:  [ ]  Arthritis, [ ]  Low back pain, [ ]  Joint pain Hematologic: [ ]  Easy Bruising, [ ]  Anemia; [ ]  Hepatitis Gastrointestinal: [ ]  Blood in stool, [ ]  Gastroesophageal Reflux/heartburn, [ ]  Trouble swallowing Urinary: [ ]  chronic Kidney disease, [ ]  on HD - [ ]  MWF or [ ]  TTHS, [ ]  Burning with urination, [ ]  Difficulty urinating Skin: [ ]  Rashes, [ ]  Wounds Psychological: [ ]  Anxiety, [ ]  Depression  Social History History  Substance Use Topics  . Smoking status: Current Some Day Smoker    Types: Cigars  . Smokeless tobacco: Never Used   Comment: 1-2 cigars when golfing  . Alcohol Use: 1.5 oz/week    3 drink(s) per week     occasional    Family History Family History  Problem Relation Age of Onset  . Cancer  breast/fhx  . Heart disease      fhx  . Diabetes Neg Hx   . Cancer Father     Allergies  Allergen Reactions  . Lisinopril     REACTION: hives Patient doesn't recall this    Current Facility-Administered Medications  Medication Dose Route Frequency Provider Last Rate Last Dose  . 0.9 %  sodium chloride infusion   Intravenous Continuous Iran Ouch, MD      . acetaminophen (TYLENOL) tablet 650 mg  650 mg Oral Q4H PRN Jessica A Hope, PA-C      . aspirin chewable tablet 324 mg  324 mg Oral Pre-Cath Iran Ouch, MD   324 mg at 09/11/11 0534  . aspirin chewable tablet 81 mg  81 mg Oral Daily Iran Ouch, MD      . carvedilol (COREG) tablet 6.25 mg  6.25 mg Oral BID WC Jessica A Hope, PA-C   6.25 mg at 09/11/11 1324  . clobetasol cream (TEMOVATE)  0.05 %   Topical BID Jessica A Hope, PA-C      . fentaNYL (SUBLIMAZE) 0.05 MG/ML injection           . gabapentin (NEURONTIN) capsule 300 mg  300 mg Oral Daily Jessica A Hope, PA-C   300 mg at 09/11/11 0958  . heparin 2-0.9 UNIT/ML-% infusion           . heparin injection 5,000 Units  5,000 Units Subcutaneous Q8H Jessica A Hope, PA-C   5,000 Units at 09/11/11 0534  . HYDROcodone-acetaminophen (NORCO) 7.5-325 MG per tablet 1 tablet  1 tablet Oral Q6H PRN Jory Sims, PA-C   1 tablet at 09/10/11 2146  . insulin aspart (novoLOG) injection 0-9 Units  0-9 Units Subcutaneous TID WC Jessica A Hope, PA-C      . insulin aspart protamine-insulin aspart (NOVOLOG 70/30) injection 10 Units  10 Units Subcutaneous Q breakfast Jessica A Hope, PA-C      . insulin aspart protamine-insulin aspart (NOVOLOG 70/30) injection 6 Units  6 Units Subcutaneous Q supper Jory Sims, PA-C   6 Units at 09/10/11 1711  . lidocaine (XYLOCAINE) 1 % injection           . midazolam (VERSED) 2 MG/2ML injection           . morphine 2 MG/ML injection 2 mg  2 mg Intravenous Q1H PRN Iran Ouch, MD      . ondansetron [facility]) injection 4 mg  4 mg Intravenous Q6H PRN Jessica A Hope, PA-C      . vitamin E capsule 1,000 Units  1,000 Units Oral Daily Jessica A Hope, PA-C   1,000 Units at 09/11/11 0958  . zolpidem (AMBIEN) tablet 5 mg  5 mg Oral QHS PRN Guadlupe Spanish Hope, PA-C   5 mg at 09/10/11 2146  . DISCONTD: 0.9 %  sodium chloride infusion  250 mL Intravenous PRN Iran Ouch, MD      . DISCONTD: 0.9 %  sodium chloride infusion   Intravenous Continuous Iran Ouch, MD 75 mL/hr at 09/10/11 1756    . DISCONTD: sodium chloride 0.9 % injection 3 mL  3 mL Intravenous Q12H Iran Ouch, MD      . DISCONTD: sodium chloride 0.9 % injection 3 mL  3 mL Intravenous PRN Iran Ouch, MD         Imaging: No results found.  Significant Diagnostic Studies: CBC Lab Results  Component Value Date   WBC  6.9 09/10/2011   HGB  14.6 09/10/2011   HCT 42.4 09/10/2011   MCV 85.1 09/10/2011   PLT 157 09/10/2011    BMET    Component Value Date/Time   NA 140 09/11/2011 0500   K 4.2 09/11/2011 0500   CL 105 09/11/2011 0500   CO2 25 09/11/2011 0500   GLUCOSE 83 09/11/2011 0500   BUN 41* 09/11/2011 0500   CREATININE 2.39* 09/11/2011 0500   CALCIUM 8.5 09/11/2011 0500   GFRNONAA 25* 09/11/2011 0500   GFRAA 29* 09/11/2011 0500    COAG Lab Results  Component Value Date   INR 1.07 09/11/2011   INR 1.10 12/25/2009   INR 2.1 11/01/2009   No results found for this basename: PTT     Physical Examination BP Readings from Last 3 Encounters:  09/11/11 143/83  09/11/11 143/83  09/06/11 110/70   Temp Readings from Last 3 Encounters:  09/11/11 97.8 F (36.6 C) Oral  09/11/11 97.8 F (36.6 C) Oral  09/06/11 98.2 F (36.8 C) Oral   SpO2 Readings from Last 3 Encounters:  09/11/11 96%  09/11/11 96%  09/06/11 97%   Pulse Readings from Last 3 Encounters:  09/11/11 70  09/11/11 70  09/06/11 73    General:  WDWN in NAD HENT: WNL Eyes: Pupils equal Pulmonary: normal non-labored breathing , without Rales, rhonchi,  wheezing Cardiac: RRR with ectopic beats, without  Murmurs, rubs or gallops; No carotid bruits Abdomen: soft, NT, no masses Skin: no rashes, ulcers noted Right groin soft Vascular Exam/Pulses: 2+ palpable femoral bilaterally Faint peroneal doppler signal bilaterally Right PT strong monophasic doppler signal/ no DP signal Left DP monophasic doppler/ faint doppler PT Extremities without ischemic changes, no Gangrene , no cellulitis; no open wounds;  Musculoskeletal: no muscle wasting or atrophy  Neurologic: A&O X 3; Appropriate Affect ;  SENSATION: decreased; MOTOR FUNCTION: Pt has good and equal strength in all extremities - 5/5 Speech is fluent/normal  Non-Invasive Vascular Imaging:08/29/11  ABI's 0.75 right/ left 0.51  ASSESSMENT/PLAN: DEMETRICK EICHENBERGER is a 77 y.o. male with hx CAD, DM who has worsening  claudication in the LLE as compared to right which has now progressed to night pain. Angiogram done today shows severe PVD on left with AT runoff only, distal popliteal artery is occluded. Dr. Hart Rochester reviewed angiogram films and pt will need Left femoral to tibial bypass with vein for revascularization of left leg. [facility] called for pre-op clearance    Agree with above Patient has severe occlusive disease and left lower extremity with left superficial femoral popliteal and tibial occlusive disease. Angiogram reviewed and reveals best distal vessel to the anterior tibial artery. Posterior tibial is patent but diseased and peroneal artery is occluded. Below-knee popliteal artery is occluded.  Patient needs left femoral to anterior tibial bypass with saphenous vein. Saphenous vein does not appear to be very large on physical exam. Will obtain vein mapping of bilateral great saphenous and lesser saphenous veins to see what conduits are available.  Patient will also need cardiac clearance by Dr. Katrinka Blazing or Kennith Maes.  Patient would like to wait 2 weeks for proceeding with surgery. Currently he does not have true rest pain nor any ischemic ulcers or infection. If symptoms do not change I think it would be safe to wait 2 weeks. If he developed any ulcerations, worsening rest pain, or infection-than we should proceed with surgery sooner.  If renal function is stable on Thursday will be okay to discharge. I will  look at vein mapping and have tentatively scheduled his surgery for Wednesday, July 24. Risks and benefits and potential failure of graft discussed with patient and wife

## 2011-09-11 NOTE — Interval H&P Note (Signed)
History and Physical Interval Note:  09/11/2011 12:09 PM  [name]  has presented today for surgery, with the diagnosis of PVD  The various methods of treatment have been discussed with the patient and family. After consideration of risks, benefits and other options for treatment, the patient has consented to  Procedure(s) (LRB): ABDOMINAL AORTAGRAM (N/A) as a surgical intervention .  The patient's history has been reviewed, patient examined, no change in status, stable for surgery.  I have reviewed the patients' chart and labs.  Questions were answered to the patient's satisfaction.  The main concern is risk of contrast induced nephropathy. He was hydrated overnight. Will try to minimize contrast use.    Lorine Bears

## 2011-09-11 NOTE — Progress Notes (Signed)
  VASCULAR LAB PRELIMINARY  PRELIMINARY  PRELIMINARY  PRELIMINARY  Left Lower Extremity Vein Map    Left Great Saphenous Vein   Segment Diameter Comment  1. Origin 4mm   2. High Thigh 2.41mm   3. Mid Thigh 2.49mm Partially compressible  4. Low Thigh 2.88mm Partially compressible  5. At Knee 2.43mm   6. High Calf 2.52mm   7. Low Calf 1.38mm branch  8. Ankle mm Not visualized   mm    mm    mm     Left Small Saphenous Vein  Segment Diameter Comment  1. Origin 2.69mm   2. Below knee 4.7mm   3. Proximal calf 2.54mm   4. Mid calf 1.15mm branch   mm    mm    mm      Right Lower Extremity Vein Map    Right Great Saphenous Vein   Segment Diameter Comment  1. Origin 2.49mm   2. High Thigh 2.80mm   3. Mid Thigh 2.50mm Thickened walls  4. Low Thigh 2.47mm branch  5. At Knee 1.25mm   6. High Calf 1.30mm Thickened walls  7. Low Calf 1.5mm Thickened walls  8. Ankle 1.73mm    mm    mm    mm     Right Small Saphenous Vein  Segment Diameter Comment  1. Origin 3.69mm Partially compressible  2. Below knee 1.44mm Partially compressible  3. Proximal Calf mm Not visualized  4. Mid calf mm Not visualized   mm    mm    mm     [name], 09/11/2011, 6:15 PM

## 2011-09-11 NOTE — CV Procedure (Addendum)
PERIPHERAL VASCULAR PROCEDURE  NAME:  [name]   MRN: [id number] DOB:  12/06/1934   ADMIT DATE: 09/10/2011  Performing Cardiologist: Lorine Bears Primary Physician: Nelwyn Salisbury, MD Primary Cardiologist:  Verdis Prime M.D.  Procedures Performed:  Abdominal Aortic Angiogram with Bi-Iliofemoral Runoff using CO2.  Selective left lower extremity angiography.   Indication(s): Claudication with rest pain and early critical Limb Ischemia.    Consent: The procedure with Risks/Benefits/Alternatives and Indications was reviewed with the patient .  All questions were answered.  Medications:  Sedation:  2 mg IV Versed, 50 mcg IV Fentanyl  Contrast:  53 ML Omnipaque  Procedural details: The right groin was prepped, draped, and anesthetized with 1% lidocaine. Using modified Seldinger technique, a 5 French sheath was introduced into the right common femoral artery. A 5 Fr Short Pigtail Catheter was advanced of over a  Versicore wire into the descending Aorta to a level just above the renal arteries. A hand injection with CO2 was performed for Abdominal Aortic Angiography.  The catheter was then pulled back to a level just above the Aortic bifurcation, and a second hand injection was performed to evaluate bi-ileiofemoral arteries with runnoff to the mid SFA level.  The pigtail catheter was a change over the versicolor wire for A crossover catheter which was then pulled back the aortic bifurcation and the wire was advanced down the contralateral common iliac artery the wire was then advanced to the contralateral common femoral artery, a straight tip catheter was advanced over the wire to the common femoral artery. Left lower extremity angiography was then performed utilizing IV contrast.     Hemodynamics:  Central Aortic Pressure / Mean Aortic Pressure: 158/76  Findings:  Abdominal aorta: Normal in size without significant stenosis or aneurysm.  Left renal artery: Normal in size  with very mild ostial disease.  Right renal artery: A stent is noted in the right renal artery which appears to be patent.  Celiac artery: Not well visualized but seems to be patent.  Superior mesenteric artery: Patent.  Right common iliac artery: There appears to be mild distal disease likely no greater than 40% in severity.  Right internal iliac artery: Patent without significant disease.  Right external iliac artery: Normal.  Right common femoral artery: Mild 20% stenosis.  Right profunda femoral artery: 40% proximal disease.  Right superficial femoral artery: 40% ostial stenosis. In the proximal segment there is an 80% focal stenosis. The rest of the vessel has mild diffuse atherosclerosis.  The rest of the right lower extremity was not imaged.  Left common iliac artery:  Normal.  Left internal iliac artery: Normal.  Left external iliac artery: Normal.  Left common femoral artery: Mild atherosclerosis with 20% stenosis.  Left profunda femoral artery: Mild disease in the proximal segment. There is 70% focal stenosis proximally.  Left superficial femoral artery:  The vessel is occluded after about 5-6 cm from its origin. SFA reconstitutes in the midsegment with collaterals. The distal SFA has diffuse 40-50% disease.  Left popliteal artery: Diffuse 80% proximal disease followed by occlusion below the knee.   Left anterior tibial artery: Small vessel with mild diffuse disease throughout its course.  Left peroneal artery: Seems to be occluded.  Left posterior tibial artery: Small vessel with diffuse disease.  Conclusions: 1. No significant aortoiliac disease. 2. Significant 80% focal stenosis in proximal right SFA. 3. Chronic total occlusion of the proximal left SFA. The left popliteal is diffusely diseased proximally and occluded below  the knee. There is two-vessel runoff on the left side.  Recommendations:  Recommend surgical evaluation for possible left lower  extremity bypass. The right leg below the knee was not imaged to save contrast load due to advanced chronic kidney disease. Right SFA stenosis is approachable percutaneously if clinically indicated but the priority now is to treat left extremity.    Lorine Bears, MD, [facility] 09/11/2011 1:11 PM

## 2011-09-12 ENCOUNTER — Encounter (HOSPITAL_COMMUNITY): Payer: Self-pay | Admitting: Physician Assistant

## 2011-09-12 DIAGNOSIS — I4891 Unspecified atrial fibrillation: Secondary | ICD-10-CM

## 2011-09-12 DIAGNOSIS — I739 Peripheral vascular disease, unspecified: Secondary | ICD-10-CM

## 2011-09-12 LAB — GLUCOSE, CAPILLARY
Glucose-Capillary: 69 mg/dL — ABNORMAL LOW (ref 70–99)
Glucose-Capillary: 146 mg/dL — ABNORMAL HIGH (ref 70–99)

## 2011-09-12 LAB — BASIC METABOLIC PANEL
CO2: 24 mEq/L (ref 19–32)
Chloride: 109 mEq/L (ref 96–112)
GFR calc Af Amer: 33 mL/min — ABNORMAL LOW (ref >90)
Potassium: 4.9 mEq/L (ref 3.5–5.1)
Sodium: 141 mEq/L (ref 135–145)

## 2011-09-12 MED ORDER — FUROSEMIDE 40 MG PO TABS: 40.0000 mg | ORAL_TABLET | Freq: Every day | ORAL | Status: DC

## 2011-09-12 MED ORDER — ASPIRIN 81 MG PO TBEC
81.0000 mg | DELAYED_RELEASE_TABLET | Freq: Every day | ORAL | Status: DC
Start: 2011-09-12 — End: 2011-12-18

## 2011-09-12 NOTE — Progress Notes (Signed)
Pt discharged to home per MD order. Pt received all discharge instructions and medication information including follow-up appointments and prescriptions. Pt alert and oriented at discharge with no complaints of pain.  Efraim Kaufmann

## 2011-09-12 NOTE — Progress Notes (Signed)
Right  Upper Extremity Vein Map    Cephalic  Segment Diameter Depth Comment  1. Axilla 1.68mm mm   2. Mid upper arm 1.73mm mm   3. Above AC 2.75mm mm   4. In AC 1.32mm mm   5. Below AC 3.60mm mm   6. Mid forearm 3.45mm mm   7. Wrist 2.55mm mm    mm mm    mm mm    mm mm       Left Upper Extremity Vein Map    Cephalic  Segment Diameter Depth Comment  1. Axilla 3.54mm mm   2. Mid upper arm 1.63mm mm   3. Above AC 1.54mm mm   4. In AC 2.73mm mm   5. Below AC 1.71mm mm   6. Mid forearm 1.25mm mm   7. Above wrist 2.37mm mm    mm mm    mm mm    mm mm     09/12/2011 10:09 AM Gertie Fey, RDMS, RDCS

## 2011-09-12 NOTE — Discharge Summary (Signed)
Discharge Summary   Patient ID: [name] MRN: [id number], DOB/AGE: 08-01-1934 77 y.o. Admit date: 09/10/2011 D/C date:     09/12/2011  Primary Cardiologist: Dr. Garnette Scheuermann  EP - Allred, PV - Arida  Primary Discharge Diagnoses:  1. Severe peripheral arterial disease - awaiting left femoral to anterior tibial bypass surgery 09/25/11 2. Chronic kidney disease stage III - prior right renal artery stent by Dr. Eldridge Dace 2009 3. CAD - s/p mid LAD stenting with DES 2008 - nuclear study 2009 demonstrated large fixed anterior wall defect, no ischemia noted - for pre-op nuclear stress test with [facility]  Secondary Discharge Diagnoses:  1. Chronic systolic CHF with EF 30% due to ischemic heart disease and hypertension, s/p AICD 2. Sleep apnea 3. H/o renal artery stenosis s/p renal artery stent 4. HTN 5. Hyperlipidemia 6. Diabetes mellitus 7. H/o atrial fibrillation, currently not on anticoagulation 8. ED 9. Gout  Hospital Course: [name] is a 77 y/o M with hx of CAD, ICM, sleep apnea, CKD who presented to [facility] 09/10/11 for hydration in prep for planned abdominal angiogram by Dr. Kirke Corin. His primary cardiologist is Garnette Scheuermann and primary electrophysiologist is Dr. Johney Frame. Baseline Cr is reported at 2.4. He also has a reported hx of atrial fibrillation but is not on anticoagulation and reports easy bruising even with being only on small dose aspirin. He was referred to Dr. Jari Sportsman office 7/3 for complaints of claudication after walking only 10-15 yards. He also noticed purplish discoloration of both feet worse on the left side. He denied any nonhealing ulcers. He was brought in 7/9 for hydration; PV angiogram was performed 09/11/11 demonstrating  Conclusions:  1. No significant aortoiliac disease.  2. Significant 80% focal stenosis in proximal right SFA.  3. Chronic total occlusion of the proximal left SFA. The left popliteal is diffusely diseased proximally and  occluded below the knee. There is two-vessel runoff on the left side. Dr. Kirke Corin recommended surgical evaluation for possible LLE bypass. The R leg below the knee was not imaged to save contrast load due to advanced CKD; Dr. Kirke Corin did note that right SFA stenosis is approachable percutaneously if clinically indicated but the priority now is to treat left extremity. Dr. Hart Rochester saw the patient in consultation, obtained vein mapping, and recommended left femoral to anterior tibial bypass with saphenous vein from both lower extremities. Dr. Hart Rochester discussed at length with the patient that a poor conduit will definitely affect patency right femoral tibial bypass. Preliminarily his surgery will be scheduled for 09/25/11, with consideration for surgery sooner if he develops any ulcerations, worsening rest pain, or infection. Dr. Katrinka Blazing saw the patient in consultation for pre-op clearance. He plans to proceed with a pharmacologic stress myocardial perfusion study and notes that Assuming no large areas of ischemia on myocardial perfusion study, he will be cleared for the upcoming vascular surgical procedure and classified as moderate to high risk (5% or greater chance of CV complications). There no current symptoms to suggest that he is unstable. His Cr is considered stable today (2.41 on admit, 2.12 today). Dr. Katrinka Blazing will have his office call the patient to arrange stress test, and would like him to remain on ASA for now. Dr. Tenny Craw has seen and examined the patient and feels he is stable for discharge. She would like for [name] to resume his lasix tomorrow, but hold off on Cozaar for now. She will be contacting Dr. Katrinka Blazing to discuss his cozaar in relation  to possible f/u labwork.  Discharge Vitals: Blood pressure 137/77, pulse 74, temperature 98.4 F (36.9 C), temperature source Oral, resp. rate 18, height 5\' 8"  (1.727 m), weight 189 lb (85.73 kg), SpO2 99.00%.  Labs: Lab Results  Component Value Date   WBC 6.3  09/11/2011   HGB 14.7 09/11/2011   HCT 43.2 09/11/2011   MCV 86.1 09/11/2011   PLT 106* 09/11/2011     Lab 09/12/11 0600  NA 141  K 4.9  CL 109  CO2 24  BUN 29*  CREATININE 2.12*  CALCIUM 8.5  PROT --  BILITOT --  ALKPHOS --  ALT --  AST --  GLUCOSE 69*    Lab Results  Component Value Date   CHOL 162 06/26/2011   HDL 57.30 06/26/2011   LDLCALC 84 06/26/2011   TRIG 105.0 06/26/2011    Diagnostic Studies/Procedures   1. PV angio, please see full report for details  Discharge Medications   Medication List  As of 09/12/2011  2:08 PM   STOP taking these medications         losartan 100 MG tablet         TAKE these medications         aspirin 81 MG EC tablet   Take 1 tablet (81 mg total) by mouth daily.      carvedilol 12.5 MG tablet   Commonly known as: COREG   Take 0.5 tablets (6.25 mg total) by mouth 2 (two) times daily.      furosemide 40 MG tablet   Commonly known as: LASIX   Take 1 tablet (40 mg total) by mouth daily.      gabapentin 300 MG capsule   Commonly known as: NEURONTIN   Take 300 mg by mouth daily.      glucose blood test strip   Use as instructed      halobetasol 0.05 % cream   Commonly known as: ULTRAVATE   Apply topically 2 (two) times daily.      HYDROcodone-acetaminophen 7.5-325 MG per tablet   Commonly known as: NORCO   Take 1 tablet by mouth every 6 (six) hours as needed.      insulin aspart protamine-insulin aspart (70-30) 100 UNIT/ML injection   Commonly known as: NOVOLOG 70/30   10 units with breakfast, and 6 units with the evening meal      VITAMIN B-6 PO   Take by mouth daily.      vitamin E 1000 UNIT capsule   Take 1,000 Units by mouth daily.          As above, cozaar will be held, lasix will be resumed tomorrow.  Disposition   The patient will be discharged in stable condition to home. Discharge Orders    Future Appointments: Provider: Department: Dept Phone: Center:   10/03/2011 12:50 PM [facility]  [facility] [phone] [id number]     Future Orders Please Complete By Expires   Diet - low sodium heart healthy      Comments:   Diabetic Diet   Increase activity slowly      Comments:   You may shower, but no soaking baths/hot tubs/pools for 1 week.  No driving for 2 days. No lifting over 5 lbs for 1 week. No sexual activity for 1 week. Keep procedure site clean & dry. If you notice increased pain, swelling, bleeding or pus, call/return!     Follow-up Information    Follow up with Lesleigh Noe, MD. (  Dr. Michaelle Copas office will call you to arrange stress test.)    Contact information:   [address] [place] [address] [place] [address] [phone]       Follow up with [name], MD. (Please contact Dr. Candie Chroman office for official instructions on preparing and arriving for your surgery)    Contact information:   [address] [place] [address] [phone]            Duration of Discharge Encounter: Greater than 30 minutes including physician and PA time.  Signed, Ronie Spies PA-C 09/12/2011, 2:08 PM

## 2011-09-12 NOTE — Progress Notes (Signed)
Subjective: No SOB or CP Objective: Filed Vitals:   09/11/11 1515 09/11/11 1752 09/11/11 2117 09/12/11 0548  BP: 135/59 136/67 124/89 132/81  Pulse: 72 77 73 72  Temp: 97.8 F (36.6 C)  97.7 F (36.5 C) 98.4 F (36.9 C)  TempSrc:   Oral Oral  Resp: 20  18 18   Height:      Weight:    189 lb (85.73 kg)  SpO2: 97%  99% 99%   Weight change: 3 lb 14.4 oz (1.769 kg)  Intake/Output Summary (Last 24 hours) at 09/12/11 1022 Last data filed at 09/12/11 0807  Gross per 24 hour  Intake    560 ml  Output    675 ml  Net   -115 ml    General: Alert, awake, oriented x3, in no acute distress Neck:  JVP is normal Heart: Regular rate and rhythm,  I to II/VI systolic murmur. rubs, gallops.  Lungs: Clear to auscultation.  No rales or wheezes. Exemities:  No edema.   Neuro: Grossly intact, nonfocal.   Lab Results: Results for orders placed during the hospital encounter of 09/10/11 (from the past 24 hour(s))  CBC     Status: Abnormal   Collection Time   09/11/11  4:22 PM      Component Value Range   WBC 6.3  4.0 - 10.5 K/uL   RBC 5.02  4.22 - 5.81 MIL/uL   Hemoglobin 14.7  13.0 - 17.0 g/dL   HCT 16.1  09.6 - 04.5 %   MCV 86.1  78.0 - 100.0 fL   MCH 29.3  26.0 - 34.0 pg   MCHC 34.0  30.0 - 36.0 g/dL   RDW 40.9  81.1 - 91.4 %   Platelets 106 (*) 150 - 400 K/uL  CREATININE, SERUM     Status: Abnormal   Collection Time   09/11/11  4:22 PM      Component Value Range   Creatinine, Ser 2.03 (*) 0.50 - 1.35 mg/dL   GFR calc non Af Amer 30 (*) >90 mL/min   GFR calc Af Amer 35 (*) >90 mL/min  GLUCOSE, CAPILLARY     Status: Normal   Collection Time   09/11/11  4:46 PM      Component Value Range   Glucose-Capillary 95  70 - 99 mg/dL  GLUCOSE, CAPILLARY     Status: Normal   Collection Time   09/11/11  8:38 PM      Component Value Range   Glucose-Capillary 96  70 - 99 mg/dL  BASIC METABOLIC PANEL     Status: Abnormal   Collection Time   09/12/11  6:00 AM      Component Value Range   Sodium 141  135 - 145 mEq/L   Potassium 4.9  3.5 - 5.1 mEq/L   Chloride 109  96 - 112 mEq/L   CO2 24  19 - 32 mEq/L   Glucose, Bld 69 (*) 70 - 99 mg/dL   BUN 29 (*) 6 - 23 mg/dL   Creatinine, Ser 7.82 (*) 0.50 - 1.35 mg/dL   Calcium 8.5  8.4 - 95.6 mg/dL   GFR calc non Af Amer 29 (*) >90 mL/min   GFR calc Af Amer 33 (*) >90 mL/min  GLUCOSE, CAPILLARY     Status: Abnormal   Collection Time   09/12/11  7:20 AM      Component Value Range   Glucose-Capillary 69 (*) 70 - 99 mg/dL   Comment 1 Notify RN  GLUCOSE, CAPILLARY     Status: Abnormal   Collection Time   09/12/11  8:03 AM      Component Value Range   Glucose-Capillary 146 (*) 70 - 99 mg/dL   Comment 1 Notify RN      Studies/Results: No results found.  Medications: Reviewed    Impression:  1.  PAD.  Angio yesterday.  Seen by Phillips Grout.  Plan surgery.  Will call office to confirm date  2.  CAD/CHF  No symptoms of angina.  Volume good   3.  CRI  Cr relatively stable.  Received hydration  4  HTn  Good.  5.  Afib.  Continue meds  S/p  PPM  D/c today.  Dietrich Pates 09/12/2011, 10:22 AM

## 2011-09-12 NOTE — Progress Notes (Addendum)
[facility]  Subjective  - Left lower extremity pain and tingling secondary to claudication.    Objective 132/81 72 98.4 F (36.9 C) (Oral) 18 99%  Intake/Output Summary (Last 24 hours) at 09/12/11 0746 Last data filed at 09/12/11 0549  Gross per 24 hour  Intake      0 ml  Output    575 ml  Net   -575 ml    Vascular Exam/Pulses: 2+ palpable femoral bilaterally  Faint peroneal doppler signal bilaterally  Right PT strong monophasic doppler signal/ no DP signal  Left DP monophasic doppler/ faint doppler PT  Extremities without ischemic changes, no Gangrene , no cellulitis; no open wounds;  Sensation decreased distally left toes compared to right.  Assessment/Planning:  Dx: severe occlusive disease and left lower extremity with left superficial femoral popliteal and tibial occlusive disease  Patient will also need cardiac clearance by Dr. Smith or Arrida.  Patient would like to wait 2 weeks for proceeding with surgery. Currently he does not have true rest pain nor any ischemic ulcers or infection. If symptoms do not change I think it would be safe to wait 2 weeks. If he developed any ulcerations, worsening rest pain, or infection-than we should proceed with surgery sooner.  If renal function is stable on Thursday will be okay to discharge. I will look at vein mapping and have tentatively scheduled his surgery for Wednesday, July 24. Risks and benefits and potential failure of graft discussed with patient and wife    Campos, Jonathan MAUREEN 09/12/2011 7:46 AM --  Laboratory Lab Results:  Basename 09/11/11 1622 09/10/11 1507  WBC 6.3 6.9  HGB 14.7 14.6  HCT 43.2 42.4  PLT 106* 157   BMET  Basename 09/12/11 0600 09/11/11 1622 09/11/11 0500  NA 141 -- 140  K 4.9 -- 4.2  CL 109 -- 105  CO2 24 -- 25  GLUCOSE 69* -- 83  BUN 29* -- 41*  CREATININE 2.12* 2.03* --  CALCIUM 8.5 -- 8.5    COAG Lab Results  Component Value Date   INR  1.07 09/11/2011   INR 1.10 12/25/2009   INR 2.1 11/01/2009   No results found for this basename: PTT    Antibiotics Anti-infectives    None         Today I reviewed the vein mapping of lower and upper extremities. Upper extremity veins are poor Lower extremity veins do not appear to be adequate below the mid thigh level. There is some areas of thickening of the vein as if there has been previous thrombophlebitis  On physical exam veins below the knee bilaterally appear small  I discussed at length with patient that a poor conduit will definitely affect patency right femoral tibial bypass. He is having quite severe symptoms with some rest component and I feel that he does need revascularization.  Renal function today is stable We'll get stress test as an outpatient per Dr. Smith Plan left femoral to anterior tibial bypass on Wednesday, July 24 with possible saphenous vein from both lower extremities  Risks and benefits discussed with patient and he understands and would like to proceed- Okay to discharge 

## 2011-09-12 NOTE — Consult Note (Addendum)
Admit date: 09/10/2011 Referring Physician : J.D. Hart Rochester, MD Primary Physician:   Kristin Bruins, MD Primary Cardiologist:  Wayne Both, III, MD Reason for Consultation: Clearance for left lower extremity PAD  ASSESSMENT: 1. Severe left greater than right lower extremity PAD with limiting claudication, left. Angiography demonstrates single-vessel runoff into the left lower extremity. Surgical bypass has been recommended.  2. Chronic systolic heart failure, ejection fraction 30%, combined etiology do to ischemic heart disease and hypertension.  3. Coronary atherosclerotic heart disease with prior mid LAD stenting 2008 using Cypher drug-eluting stent. Nuclear perfusion study in 2009 demonstrating a large fixed anterior wall defect. No ischemia noted.  4. AICD for persistent low EF, 2011  5. Sleep apnea  6. Hypertension  7. Chronic kidney disease, stage III, with prior right renal artery stent by Dr. Eldridge Dace 2009  8. Pre-op  Clearance for Vascular Surgery. There are no clinical contraindications but he is very sedantary due to claudication.  PLAN:  1. Will repeat a pharmacologic stress myocardial perfusion study preop to rule out any areas of significant ischemia that would place him at higher than expected risk.  2. Assuming no large areas of ischemia on myocardial perfusion study, he will be cleared for the upcoming vascular surgical procedure and classified as moderate to high risk (5% or greater chance of CV complications). There no current symptoms oh suggests that he is unstable on that surgery should not be performed to   HPI: [name] is well known to me and has a history of chronic atrial fibrillation, hypertension, renal insufficiency, coronary artery disease with prior mid LAD stenting, chronic systolic heart failure with LVEF less than 35%, AICD implantation, and most recently, aggressive limitations do to bilateral lower extremity pain with exertion related to claudication.  Her extremity angiography performed by Dr. Kirke Corin demonstrates single-vessel runoff into the left lower extremity. He will need fem-tib bypass by Dr. Hart Rochester.  He denies angina, nitroglycerin use, orthopnea, PND, edema, syncope, and AICD discharge.   PMH:   Past Medical History  Diagnosis Date  . Hyperlipidemia   . Hypertension   . Gout   . ED (erectile dysfunction)   . Benign prostatic hypertrophy   . Atrial fibrillation     sees Dr. Verdis Prime  . Chronic systolic dysfunction of left ventricle     mixed ischemic and nonischemic CM,  EF 35%  . Diabetes mellitus     sees Dr. Everardo All   . CAD (coronary artery disease)     s/p stenting  . Renal artery stenosis     s/p stenting  . Pacemaker   . CHF (congestive heart failure)      PSH:   Past Surgical History  Procedure Date  . Turp vaporization   . Cardiac defibrillator placement 12/26/09  . Cervical epidural injection 2013  . Cardiac catheterization   . Coronary angioplasty     Allergies:  Lisinopril Prior to Admit Meds:   Prescriptions prior to admission  Medication Sig Dispense Refill  . carvedilol (COREG) 12.5 MG tablet Take 0.5 tablets (6.25 mg total) by mouth 2 (two) times daily.  60 tablet  11  . furosemide (LASIX) 40 MG tablet Take 1 tablet (40 mg total) by mouth daily.  90 tablet  3  . gabapentin (NEURONTIN) 300 MG capsule Take 300 mg by mouth daily.      Marland Kitchen glucose blood test strip Use as instructed  50 each  99  . halobetasol (ULTRAVATE) 0.05 % cream  Apply topically 2 (two) times daily.        Marland Kitchen HYDROcodone-acetaminophen (NORCO) 7.5-325 MG per tablet Take 1 tablet by mouth every 6 (six) hours as needed.  100 tablet  1  . insulin aspart protamine-insulin aspart (NOVOLOG 70/30) (70-30) 100 UNIT/ML injection 10 units with breakfast, and 6 units with the evening meal      . losartan (COZAAR) 100 MG tablet Take 1 tablet (100 mg total) by mouth daily.  90 tablet  3  . Pyridoxine HCl (VITAMIN B-6 PO) Take by mouth daily.       . vitamin E 1000 UNIT capsule Take 1,000 Units by mouth daily.         Fam HX:    Family History  Problem Relation Age of Onset  . Cancer      breast/fhx  . Heart disease      fhx  . Diabetes Neg Hx   . Cancer Father    Social HX:    History   Social History  . Marital Status: Widowed    Spouse Name: N/A    Number of Children: N/A  . Years of Education: N/A   Occupational History  . Retired    Social History Main Topics  . Smoking status: Current Some Day Smoker    Types: Cigars  . Smokeless tobacco: Never Used   Comment: 1-2 cigars when golfing  . Alcohol Use: 1.5 oz/week    3 drink(s) per week     occasional  . Drug Use: No  . Sexually Active: Not on file   Other Topics Concern  . Not on file   Social History Narrative   Lives in [place] with significant other,  [name].     Review of Systems: He denies angina. He denies orthopnea, dyspnea, and lower extremity swelling. He has not had palpitations or syncope  Physical Exam: Blood pressure 132/81, pulse 72, temperature 98.4 F (36.9 C), temperature source Oral, resp. rate 18, height 5\' 8"  (1.727 m), weight 85.73 kg (189 lb), SpO2 99.00%. Weight change: 1.769 kg (3 lb 14.4 oz)    He is lying flat on the gurney in the vascular laboratory. There is no dyspnea.  Faint left carotid bruit is heard.  Lungs are clear to auscultation and percussion.  Cardiac exam reveals a regular rhythm. No gallop or murmur is heard.  Extremities reveal no edema. Pedal pulses are not palpable.  Neurologically intact Labs:   Lab Results  Component Value Date   WBC 6.3 09/11/2011   HGB 14.7 09/11/2011   HCT 43.2 09/11/2011   MCV 86.1 09/11/2011   PLT 106* 09/11/2011    Lab 09/12/11 0600  NA 141  K 4.9  CL 109  CO2 24  BUN 29*  CREATININE 2.12*  CALCIUM 8.5  PROT --  BILITOT --  ALKPHOS --  ALT --  AST --  GLUCOSE 69*   No results found for this basename: PTT   Lab Results  Component Value Date   INR  1.07 09/11/2011   INR 1.10 12/25/2009   INR 2.1 11/01/2009   Lab Results  Component Value Date   CKTOTAL 52 07/03/2011   CKMB 3.4 07/03/2011     Lab Results  Component Value Date   CHOL 162 06/26/2011   CHOL 190 02/20/2011   CHOL 267* 06/04/2010   Lab Results  Component Value Date   HDL 57.30 06/26/2011   HDL 14.78 02/20/2011   HDL 46.80 06/04/2010   Lab Results  Component Value Date   LDLCALC 84 06/26/2011   LDLCALC 107* 02/20/2011   Lab Results  Component Value Date   TRIG 105.0 06/26/2011   TRIG 140.0 02/20/2011   TRIG 199.0* 06/04/2010   Lab Results  Component Value Date   CHOLHDL 3 06/26/2011   CHOLHDL 3 02/20/2011   CHOLHDL 6 06/04/2010   Lab Results  Component Value Date   LDLDIRECT 189.4 06/04/2010      Radiology:  CHEST - 2 VIEW  Comparison: 12/26/2009  Findings: There is a left chest wall AICD with lead in the right  atrial appendage, coronary sinus and right ventricle. Heart size  appears normal. No pleural effusion or edema. No airspace  consolidation noted. Review of the visualized osseous structures  is unremarkable.  IMPRESSION:  1. No acute cardiopulmonary abnormalities.  Original Report Authenticated By: Rosealee Albee, M.D.  EKG:  A fib with V pace (09/11/11)  ECHO: 2011 Physician Review Conclusions: 1. Moderate left ventricular dilatation. 2. Left ventricular ejection fraction estimated by 2D at 25-30 percent. 3. Moderate concentric left ventricular hypertrophy. 4. Moderate left atrial enlargement. 5. Mild to moderate mitral valve regurgitation. 6. Mildly thickened mitral valve. 7. The aortic valve is sclerotic but opens well. 8. Mild right ventricular dilatation. 9. Mild tricuspid regurgitation   Lesleigh Noe 09/12/2011 10:03 AM

## 2011-09-16 ENCOUNTER — Other Ambulatory Visit: Payer: Self-pay

## 2011-09-16 ENCOUNTER — Encounter (HOSPITAL_COMMUNITY): Payer: Self-pay | Admitting: Pharmacy Technician

## 2011-09-23 ENCOUNTER — Encounter (HOSPITAL_COMMUNITY): Payer: Self-pay

## 2011-09-23 ENCOUNTER — Encounter (HOSPITAL_COMMUNITY)
Admission: RE | Admit: 2011-09-23 | Discharge: 2011-09-23 | Disposition: A | Discharge status: Home / Self Care | Payer: Medicare Other | Source: Ambulatory Visit | Attending: Vascular Surgery | Admitting: Vascular Surgery

## 2011-09-23 ENCOUNTER — Encounter: Payer: Self-pay | Admitting: *Deleted

## 2011-09-23 LAB — COMPREHENSIVE METABOLIC PANEL
BUN: 40 mg/dL — ABNORMAL HIGH (ref 6–23)
CO2: 25 mEq/L (ref 19–32)
Calcium: 9.7 mg/dL (ref 8.4–10.5)
Creatinine, Ser: 2.09 mg/dL — ABNORMAL HIGH (ref 0.50–1.35)
GFR calc Af Amer: 34 mL/min — ABNORMAL LOW (ref >90)
GFR calc non Af Amer: 29 mL/min — ABNORMAL LOW (ref >90)
Glucose, Bld: 119 mg/dL — ABNORMAL HIGH (ref 70–99)

## 2011-09-23 LAB — CBC
HCT: 47.1 % (ref 39.0–52.0)
Hemoglobin: 16.1 g/dL (ref 13.0–17.0)
MCH: 29.7 pg (ref 26.0–34.0)
MCV: 86.7 fL (ref 78.0–100.0)
Platelets: 149 10*3/uL — ABNORMAL LOW (ref 150–400)
RBC: 5.43 MIL/uL (ref 4.22–5.81)

## 2011-09-23 LAB — SURGICAL PCR SCREEN: Staphylococcus aureus: POSITIVE — AB

## 2011-09-23 LAB — URINALYSIS, ROUTINE W REFLEX MICROSCOPIC
Bilirubin Urine: NEGATIVE
Glucose, UA: NEGATIVE mg/dL
Hgb urine dipstick: NEGATIVE
Protein, ur: NEGATIVE mg/dL
Urobilinogen, UA: 0.2 mg/dL (ref 0.0–1.0)

## 2011-09-23 NOTE — Pre-Procedure Instructions (Addendum)
20 DAYNE CHAIT  09/23/2011   Your procedure is scheduled on:  09/25/11  Report to [facility] at 630 AM.  Call this number if you have problems the morning of surgery: [phone]   Remember:   Do not eat foodor drink:After Midnight.    Take these medicines the morning of surgery with A SIP OF WATER: pain med, carvedilol, gabapentin   Do not wear jewelry, make-up or nail polish.  Do not wear lotions, powders, or perfumes. You may wear deodorant.  Do not shave 48 hours prior to surgery. Men may shave face and neck.  Do not bring valuables to the hospital.  Contacts, dentures or bridgework may not be worn into surgery.  Leave suitcase in the car. After surgery it may be brought to your room.  For patients admitted to the hospital, checkout time is 11:00 AM the day of discharge.   Patients discharged the day of surgery will not be allowed to drive home.  Name and phone number of your driver: Leeanne Deed [phone] girlfriend  Special Instructions: CHG Shower Use Special Wash: 1/2 bottle night before surgery and 1/2 bottle morning of surgery.   Please read over the following fact sheets that you were given: Pain Booklet, Coughing and Deep Breathing, Blood Transfusion Information and MRSA Information

## 2011-09-23 NOTE — Progress Notes (Addendum)
Clearance in epic from dr h Katrinka Blazing 7/13, note from dr allred in epic /7/13, echo 2/11 included in dr smith's note, cxr 6/13, ekg 7/13 Patient has pacer/ icd fax sent to dr allred Stress test done at dr Katrinka Blazing 09/13/11 req'd, msg left for med rec

## 2011-09-24 ENCOUNTER — Telehealth: Payer: Self-pay | Admitting: Internal Medicine

## 2011-09-24 MED ORDER — DEXTROSE 5 % IV SOLN
1.5000 g | INTRAVENOUS | Status: AC
  Administered 2011-09-25: 1.5 g via INTRAVENOUS
  Filled 2011-09-24: qty 1.5

## 2011-09-24 NOTE — Telephone Encounter (Signed)
New Problem:    Called needing advice about how to handle the patient's ICD for surgery tomorrow.  Will be faxing over request.  Please call back.

## 2011-09-24 NOTE — Consult Note (Signed)
Anesthesia Chart Review:  Patient is a 77 year old male scheduled for left F-AT BPG on 09/25/11 by Dr. Hart Rochester.  History includes smoking, CAD s/p mid LAD stent 10/13/06, afib, mixed ischemia/non-ischemic CM, chronic systolic CHF s/p [employer] BiV ICD 12/25/09, left BBB, HLD, DM2, HTN, right renal artery stenosis s/p PTA/stent 10/27/06, OSA (failed CPAP), ED, PAD, gout.  Primary Cardiologist is Dr. Katrinka Blazing [facility]).  EP Cardiologist is Dr. Johney Frame [facility]).    Stress test Deboraha Sprang) on 09/13/11 showed normal myocardial perfusion, no ischemia, EF 64%.  Dr. Katrinka Blazing cleared patient for this procedure.    By notes, his last echo was in 2011 and showed an EF of 25-30%.  (Copy requested.)  EKG on 09/04/11 showed v-paced rhythm.  Cardiac cath on 10/02/06 showed: 1. Severe coronary artery disease with 95-99% mid left anterior descending stenosis. Proximal left anterior descending and left main calcification. There is also severe disease in the nondominant right coronary. The circumflex has moderate disease, but for the most part is widely patent.  2. Global left ventricular dysfunction with ejection fraction in the 40% range.  3. Severe right renal artery stenosis, approximately 90% demonstrated by selective injection.  4. Hypertension. He subsequently underwent a LAD and right RA stent in 2008.  CXR on 08/29/11 showed no acute cardiopulmonary abnormalities.  Labs from 09/23/11 noted.  BUN/Cr 40/2.09.  K 5.3.  His Cr has been 1.9-2.8 since August 2011, so overall appears at his baseline.  PLT 149.  PTT 29.  PT/INR on 09/11/11 was WNL.  ICD peri-operative device form is still pending from [facility].  Anticipate patient can proceed if no significant change in his status.  Shonna Chock, PA-C

## 2011-09-24 NOTE — Progress Notes (Signed)
Received information from Dr . Eldridge Dace... 2008  2009 2010.Marland Kitchenplaced in chart.

## 2011-09-24 NOTE — Telephone Encounter (Signed)
N/A at 1612/will try first thing Wed am/kwb

## 2011-09-24 NOTE — Progress Notes (Signed)
I called  And then re faxed the periop rx for icd's..  To [phone]   And to this number that the office operator gave me .Marland Kitchen [phone].

## 2011-09-25 ENCOUNTER — Inpatient Hospital Stay (HOSPITAL_COMMUNITY)
Admission: RE | Admit: 2011-09-25 | Discharge: 2011-09-27 | DRG: 253 | Disposition: A | Discharge status: Home / Self Care | Payer: Medicare Other | Source: Ambulatory Visit | Attending: Vascular Surgery | Admitting: Vascular Surgery

## 2011-09-25 ENCOUNTER — Ambulatory Visit (HOSPITAL_COMMUNITY): Payer: Medicare Other

## 2011-09-25 ENCOUNTER — Ambulatory Visit (HOSPITAL_COMMUNITY): Payer: Medicare Other | Admitting: Vascular Surgery

## 2011-09-25 ENCOUNTER — Encounter (HOSPITAL_COMMUNITY): Payer: Self-pay | Admitting: Vascular Surgery

## 2011-09-25 ENCOUNTER — Encounter (HOSPITAL_COMMUNITY)
Admission: RE | Disposition: A | Discharge status: Home / Self Care | Payer: Self-pay | Source: Ambulatory Visit | Attending: Vascular Surgery

## 2011-09-25 ENCOUNTER — Encounter (HOSPITAL_COMMUNITY): Payer: Self-pay | Admitting: *Deleted

## 2011-09-25 DIAGNOSIS — Z01812 Encounter for preprocedural laboratory examination: Secondary | ICD-10-CM

## 2011-09-25 DIAGNOSIS — E669 Obesity, unspecified: Secondary | ICD-10-CM | POA: Diagnosis present

## 2011-09-25 DIAGNOSIS — Z95810 Presence of automatic (implantable) cardiac defibrillator: Secondary | ICD-10-CM

## 2011-09-25 DIAGNOSIS — I2510 Atherosclerotic heart disease of native coronary artery without angina pectoris: Secondary | ICD-10-CM | POA: Diagnosis present

## 2011-09-25 DIAGNOSIS — I10 Essential (primary) hypertension: Secondary | ICD-10-CM | POA: Diagnosis present

## 2011-09-25 DIAGNOSIS — I739 Peripheral vascular disease, unspecified: Secondary | ICD-10-CM

## 2011-09-25 DIAGNOSIS — I509 Heart failure, unspecified: Secondary | ICD-10-CM | POA: Diagnosis present

## 2011-09-25 DIAGNOSIS — I70219 Atherosclerosis of native arteries of extremities with intermittent claudication, unspecified extremity: Secondary | ICD-10-CM

## 2011-09-25 DIAGNOSIS — I5022 Chronic systolic (congestive) heart failure: Secondary | ICD-10-CM | POA: Diagnosis present

## 2011-09-25 DIAGNOSIS — G4730 Sleep apnea, unspecified: Secondary | ICD-10-CM | POA: Diagnosis present

## 2011-09-25 HISTORY — PX: FEMORAL-TIBIAL BYPASS GRAFT: SHX938

## 2011-09-25 HISTORY — PX: INTRAOPERATIVE ARTERIOGRAM: SHX5157

## 2011-09-25 LAB — GLUCOSE, CAPILLARY
Glucose-Capillary: 113 mg/dL — ABNORMAL HIGH (ref 70–99)
Glucose-Capillary: 101 mg/dL — ABNORMAL HIGH (ref 70–99)

## 2011-09-25 SURGERY — CREATION, BYPASS, ARTERIAL, FEMORAL TO TIBIAL, USING GRAFT
Anesthesia: General | Site: Leg Lower | Laterality: Left | Wound class: Clean

## 2011-09-25 MED ORDER — GLYCOPYRROLATE 0.2 MG/ML IJ SOLN
INTRAMUSCULAR | Status: DC | PRN
  Administered 2011-09-25: .4 mg via INTRAVENOUS

## 2011-09-25 MED ORDER — FENTANYL CITRATE 0.05 MG/ML IJ SOLN
INTRAMUSCULAR | Status: DC | PRN
  Administered 2011-09-25 (×2): 50 ug via INTRAVENOUS
  Administered 2011-09-25: 100 ug via INTRAVENOUS

## 2011-09-25 MED ORDER — HEPARIN SODIUM (PORCINE) 1000 UNIT/ML IJ SOLN
INTRAMUSCULAR | Status: DC | PRN
  Administered 2011-09-25: 6000 [IU] via INTRAVENOUS

## 2011-09-25 MED ORDER — ACETAMINOPHEN 325 MG PO TABS: 325.0000 mg | ORAL_TABLET | ORAL | Status: DC | PRN

## 2011-09-25 MED ORDER — ONDANSETRON HCL 4 MG/2ML IJ SOLN
4.0000 mg | Freq: Four times a day (QID) | INTRAMUSCULAR | Status: DC | PRN
  Filled 2011-09-25: qty 2

## 2011-09-25 MED ORDER — HYDROMORPHONE HCL PF 1 MG/ML IJ SOLN
0.2500 mg | INTRAMUSCULAR | Status: DC | PRN
  Administered 2011-09-25 (×2): 0.5 mg via INTRAVENOUS
  Administered 2011-09-25: 0.25 mg via INTRAVENOUS
  Administered 2011-09-25: 0.5 mg via INTRAVENOUS
  Administered 2011-09-25: 0.25 mg via INTRAVENOUS

## 2011-09-25 MED ORDER — POTASSIUM CHLORIDE IN NACL 20-0.9 MEQ/L-% IV SOLN
INTRAVENOUS | Status: DC
  Administered 2011-09-25: 17:00:00 via INTRAVENOUS
  Filled 2011-09-25 (×6): qty 1000

## 2011-09-25 MED ORDER — DOPAMINE-DEXTROSE 3.2-5 MG/ML-% IV SOLN
3.0000 ug/kg/min | INTRAVENOUS | Status: DC | PRN
  Filled 2011-09-25: qty 250

## 2011-09-25 MED ORDER — ASPIRIN 81 MG PO TBEC: 81.0000 mg | DELAYED_RELEASE_TABLET | Freq: Every day | ORAL | Status: DC

## 2011-09-25 MED ORDER — PROPOFOL 10 MG/ML IV BOLUS
INTRAVENOUS | Status: DC | PRN
  Administered 2011-09-25: 150 mg via INTRAVENOUS

## 2011-09-25 MED ORDER — LIDOCAINE HCL (CARDIAC) 20 MG/ML IV SOLN
INTRAVENOUS | Status: DC | PRN
  Administered 2011-09-25: 80 mg via INTRAVENOUS

## 2011-09-25 MED ORDER — ROCURONIUM BROMIDE 100 MG/10ML IV SOLN
INTRAVENOUS | Status: DC | PRN
  Administered 2011-09-25: 50 mg via INTRAVENOUS

## 2011-09-25 MED ORDER — PANTOPRAZOLE SODIUM 40 MG PO TBEC: 40.0000 mg | DELAYED_RELEASE_TABLET | Freq: Every day | ORAL | Status: DC

## 2011-09-25 MED ORDER — HYDRALAZINE HCL 20 MG/ML IJ SOLN
10.0000 mg | INTRAMUSCULAR | Status: DC | PRN
  Filled 2011-09-25: qty 0.5

## 2011-09-25 MED ORDER — SODIUM CHLORIDE 0.9 % IV SOLN: 500.0000 mL | Freq: Once | INTRAVENOUS | Status: AC | PRN

## 2011-09-25 MED ORDER — GABAPENTIN 300 MG PO CAPS
300.0000 mg | ORAL_CAPSULE | Freq: Every day | ORAL | Status: DC
  Administered 2011-09-26 – 2011-09-27 (×2): 300 mg via ORAL
  Filled 2011-09-25 (×2): qty 1

## 2011-09-25 MED ORDER — ONDANSETRON HCL 4 MG/2ML IJ SOLN
4.0000 mg | INTRAMUSCULAR | Status: DC | PRN
  Administered 2011-09-25: 4 mg via INTRAVENOUS

## 2011-09-25 MED ORDER — ACETAMINOPHEN 650 MG RE SUPP: 325.0000 mg | RECTAL | Status: DC | PRN

## 2011-09-25 MED ORDER — INSULIN ASPART PROT & ASPART (70-30 MIX) 100 UNIT/ML ~~LOC~~ SUSP
6.0000 [IU] | Freq: Every day | SUBCUTANEOUS | Status: DC
  Filled 2011-09-25: qty 3

## 2011-09-25 MED ORDER — DOCUSATE SODIUM 100 MG PO CAPS
100.0000 mg | ORAL_CAPSULE | Freq: Every day | ORAL | Status: DC
  Administered 2011-09-26 – 2011-09-27 (×2): 100 mg via ORAL
  Filled 2011-09-25 (×2): qty 1

## 2011-09-25 MED ORDER — CARVEDILOL 6.25 MG PO TABS
6.2500 mg | ORAL_TABLET | Freq: Every day | ORAL | Status: DC
  Administered 2011-09-25 – 2011-09-26 (×2): 6.25 mg via ORAL
  Filled 2011-09-25 (×4): qty 1

## 2011-09-25 MED ORDER — BISACODYL 10 MG RE SUPP: 10.0000 mg | Freq: Every day | RECTAL | Status: DC | PRN

## 2011-09-25 MED ORDER — HYDROMORPHONE HCL PF 1 MG/ML IJ SOLN
INTRAMUSCULAR | Status: AC
  Administered 2011-09-25: 0.5 mg via INTRAVENOUS
  Filled 2011-09-25: qty 1

## 2011-09-25 MED ORDER — SODIUM CHLORIDE 0.9 % IR SOLN
Status: DC | PRN
  Administered 2011-09-25: 09:00:00

## 2011-09-25 MED ORDER — MIDAZOLAM HCL 5 MG/5ML IJ SOLN
INTRAMUSCULAR | Status: DC | PRN
  Administered 2011-09-25: 1 mg via INTRAVENOUS

## 2011-09-25 MED ORDER — MORPHINE SULFATE 2 MG/ML IJ SOLN
2.0000 mg | INTRAMUSCULAR | Status: DC | PRN
  Administered 2011-09-25 – 2011-09-26 (×3): 2 mg via INTRAVENOUS
  Administered 2011-09-26: 4 mg via INTRAVENOUS
  Administered 2011-09-26 (×2): 2 mg via INTRAVENOUS
  Filled 2011-09-25 (×4): qty 1
  Filled 2011-09-25: qty 2
  Filled 2011-09-25: qty 1

## 2011-09-25 MED ORDER — LABETALOL HCL 5 MG/ML IV SOLN
10.0000 mg | INTRAVENOUS | Status: DC | PRN
  Filled 2011-09-25: qty 4

## 2011-09-25 MED ORDER — PHENOL 1.4 % MT LIQD
1.0000 | OROMUCOSAL | Status: DC | PRN
Start: 2011-09-25 — End: 2011-09-27
  Administered 2011-09-25: 1 via OROMUCOSAL
  Filled 2011-09-25: qty 177

## 2011-09-25 MED ORDER — VECURONIUM BROMIDE 10 MG IV SOLR
INTRAVENOUS | Status: DC | PRN
  Administered 2011-09-25 (×2): 3 mg via INTRAVENOUS
  Administered 2011-09-25: 4 mg via INTRAVENOUS

## 2011-09-25 MED ORDER — INSULIN ASPART 100 UNIT/ML ~~LOC~~ SOLN
0.0000 [IU] | Freq: Three times a day (TID) | SUBCUTANEOUS | Status: DC
  Administered 2011-09-26: 2 [IU] via SUBCUTANEOUS

## 2011-09-25 MED ORDER — INSULIN ASPART PROT & ASPART (70-30 MIX) 100 UNIT/ML ~~LOC~~ SUSP
10.0000 [IU] | Freq: Every day | SUBCUTANEOUS | Status: DC
  Administered 2011-09-26 – 2011-09-27 (×2): 10 [IU] via SUBCUTANEOUS
  Filled 2011-09-25: qty 3

## 2011-09-25 MED ORDER — SODIUM CHLORIDE 0.9 % IV SOLN: INTRAVENOUS | Status: DC

## 2011-09-25 MED ORDER — METOPROLOL TARTRATE 1 MG/ML IV SOLN: 2.0000 mg | INTRAVENOUS | Status: DC | PRN

## 2011-09-25 MED ORDER — LACTATED RINGERS IV SOLN
INTRAVENOUS | Status: DC | PRN
  Administered 2011-09-25 (×2): via INTRAVENOUS

## 2011-09-25 MED ORDER — NEOSTIGMINE METHYLSULFATE 1 MG/ML IJ SOLN
INTRAMUSCULAR | Status: DC | PRN
  Administered 2011-09-25: 3 mg via INTRAVENOUS

## 2011-09-25 MED ORDER — IOHEXOL 300 MG/ML  SOLN
INTRAMUSCULAR | Status: DC | PRN
  Administered 2011-09-25: 25 mL via INTRA_ARTERIAL

## 2011-09-25 MED ORDER — ONDANSETRON HCL 4 MG/2ML IJ SOLN
INTRAMUSCULAR | Status: DC | PRN
  Administered 2011-09-25: 4 mg via INTRAVENOUS

## 2011-09-25 MED ORDER — IOHEXOL 300 MG/ML  SOLN
INTRAMUSCULAR | Status: AC
  Filled 2011-09-25: qty 1

## 2011-09-25 MED ORDER — ONDANSETRON HCL 4 MG/2ML IJ SOLN
INTRAMUSCULAR | Status: AC
  Administered 2011-09-25: 4 mg
  Filled 2011-09-25: qty 2

## 2011-09-25 MED ORDER — POTASSIUM CHLORIDE CRYS ER 20 MEQ PO TBCR: 20.0000 meq | EXTENDED_RELEASE_TABLET | Freq: Once | ORAL | Status: AC | PRN

## 2011-09-25 MED ORDER — SODIUM CHLORIDE 0.9 % IV SOLN
10.0000 mg | INTRAVENOUS | Status: DC | PRN
  Administered 2011-09-25: 15 ug/min via INTRAVENOUS

## 2011-09-25 MED ORDER — POLYETHYLENE GLYCOL 3350 17 G PO PACK
17.0000 g | PACK | Freq: Every day | ORAL | Status: DC | PRN
  Filled 2011-09-25: qty 1

## 2011-09-25 MED ORDER — DEXTROSE 5 % IV SOLN
1.5000 g | Freq: Two times a day (BID) | INTRAVENOUS | Status: AC
  Administered 2011-09-25 – 2011-09-26 (×2): 1.5 g via INTRAVENOUS
  Filled 2011-09-25 (×3): qty 1.5

## 2011-09-25 MED ORDER — HYDROCODONE-ACETAMINOPHEN 7.5-325 MG PO TABS
1.0000 | ORAL_TABLET | ORAL | Status: DC | PRN
  Administered 2011-09-26 – 2011-09-27 (×5): 1 via ORAL
  Filled 2011-09-25 (×5): qty 1

## 2011-09-25 MED ORDER — PROMETHAZINE HCL 25 MG/ML IJ SOLN: 6.2500 mg | INTRAMUSCULAR | Status: DC | PRN

## 2011-09-25 MED ORDER — 0.9 % SODIUM CHLORIDE (POUR BTL) OPTIME
TOPICAL | Status: DC | PRN
  Administered 2011-09-25: 1000 mL
  Administered 2011-09-25: 2000 mL

## 2011-09-25 MED ORDER — FUROSEMIDE 40 MG PO TABS
40.0000 mg | ORAL_TABLET | Freq: Every day | ORAL | Status: DC
  Administered 2011-09-26 – 2011-09-27 (×2): 40 mg via ORAL
  Filled 2011-09-25 (×2): qty 1

## 2011-09-25 MED ORDER — ASPIRIN EC 81 MG PO TBEC
81.0000 mg | DELAYED_RELEASE_TABLET | Freq: Every day | ORAL | Status: DC
  Administered 2011-09-26 – 2011-09-27 (×2): 81 mg via ORAL
  Filled 2011-09-25 (×2): qty 1

## 2011-09-25 MED ORDER — MEPERIDINE HCL 25 MG/ML IJ SOLN: 6.2500 mg | INTRAMUSCULAR | Status: DC | PRN

## 2011-09-25 SURGICAL SUPPLY — 65 items
ADH SKN CLS APL DERMABOND .7 (GAUZE/BANDAGES/DRESSINGS) ×4
BANDAGE ESMARK 6X9 LF (GAUZE/BANDAGES/DRESSINGS) IMPLANT
BNDG CMPR 9X6 STRL LF SNTH (GAUZE/BANDAGES/DRESSINGS)
BNDG ESMARK 6X9 LF (GAUZE/BANDAGES/DRESSINGS)
BOOT SUTURE AID YELLOW STND (SUTURE) IMPLANT
CANISTER SUCTION 2500CC (MISCELLANEOUS) ×2 IMPLANT
CATH EMB 3FR 80CM (CATHETERS) ×1 IMPLANT
CLIP TI MEDIUM 24 (CLIP) ×2 IMPLANT
CLIP TI WIDE RED SMALL 24 (CLIP) ×2 IMPLANT
CLOTH BEACON ORANGE TIMEOUT ST (SAFETY) ×2 IMPLANT
COVER SURGICAL LIGHT HANDLE (MISCELLANEOUS) ×2 IMPLANT
CUFF TOURNIQUET SINGLE 24IN (TOURNIQUET CUFF) IMPLANT
CUFF TOURNIQUET SINGLE 34IN LL (TOURNIQUET CUFF) IMPLANT
DERMABOND ADVANCED (GAUZE/BANDAGES/DRESSINGS) ×4
DERMABOND ADVANCED .7 DNX12 (GAUZE/BANDAGES/DRESSINGS) ×1 IMPLANT
DRAIN SNY 10X20 3/4 PERF (WOUND CARE) IMPLANT
DRAPE WARM FLUID 44X44 (DRAPE) ×2 IMPLANT
DRAPE X-RAY CASS 24X20 (DRAPES) ×1 IMPLANT
DRSG COVADERM 4X10 (GAUZE/BANDAGES/DRESSINGS) ×1 IMPLANT
ELECT REM PT RETURN 9FT ADLT (ELECTROSURGICAL) ×2
ELECTRODE REM PT RTRN 9FT ADLT (ELECTROSURGICAL) ×1 IMPLANT
EVACUATOR SILICONE 100CC (DRAIN) IMPLANT
GLOVE BIO SURGEON STRL SZ 6.5 (GLOVE) ×1 IMPLANT
GLOVE BIOGEL PI IND STRL 6.5 (GLOVE) IMPLANT
GLOVE BIOGEL PI IND STRL 7.0 (GLOVE) IMPLANT
GLOVE BIOGEL PI INDICATOR 6.5 (GLOVE) ×1
GLOVE BIOGEL PI INDICATOR 7.0 (GLOVE) ×3
GLOVE ECLIPSE 6.5 STRL STRAW (GLOVE) ×2 IMPLANT
GLOVE ECLIPSE 7.0 STRL STRAW (GLOVE) ×1 IMPLANT
GLOVE SS BIOGEL STRL SZ 7 (GLOVE) ×1 IMPLANT
GLOVE SUPERSENSE BIOGEL SZ 7 (GLOVE) ×1
GOWN PREVENTION PLUS XLARGE (GOWN DISPOSABLE) ×1 IMPLANT
GOWN STRL NON-REIN LRG LVL3 (GOWN DISPOSABLE) ×4 IMPLANT
INSERT FOGARTY SM (MISCELLANEOUS) ×2 IMPLANT
KIT BASIN OR (CUSTOM PROCEDURE TRAY) ×2 IMPLANT
KIT ROOM TURNOVER OR (KITS) ×2 IMPLANT
NS IRRIG 1000ML POUR BTL (IV SOLUTION) ×4 IMPLANT
PACK PERIPHERAL VASCULAR (CUSTOM PROCEDURE TRAY) ×2 IMPLANT
PAD ARMBOARD 7.5X6 YLW CONV (MISCELLANEOUS) ×4 IMPLANT
PADDING CAST COTTON 6X4 STRL (CAST SUPPLIES) IMPLANT
SET COLLECT BLD 21X3/4 12 (NEEDLE) IMPLANT
SET COLLECT BLD 21X3/4 12 PB (MISCELLANEOUS) ×1 IMPLANT
SPONGE GAUZE 4X4 12PLY (GAUZE/BANDAGES/DRESSINGS) ×2 IMPLANT
SPONGE LAP 18X18 X RAY DECT (DISPOSABLE) ×2 IMPLANT
STOPCOCK 4 WAY LG BORE MALE ST (IV SETS) ×1 IMPLANT
SUT PROLENE 5 0 C 1 24 (SUTURE) ×1 IMPLANT
SUT PROLENE 6 0 BV (SUTURE) ×1 IMPLANT
SUT PROLENE 6 0 CC (SUTURE) ×3 IMPLANT
SUT PROLENE 7 0 BV 1 (SUTURE) IMPLANT
SUT PROLENE 7 0 BV1 MDA (SUTURE) IMPLANT
SUT SILK 2 0 SH (SUTURE) ×3 IMPLANT
SUT SILK 3 0 (SUTURE)
SUT SILK 3-0 18XBRD TIE 12 (SUTURE) IMPLANT
SUT SILK 4 0 (SUTURE) ×4
SUT SILK 4-0 18XBRD TIE 12 (SUTURE) IMPLANT
SUT VIC AB 2-0 CTX 36 (SUTURE) ×4 IMPLANT
SUT VIC AB 3-0 SH 27 (SUTURE) ×10
SUT VIC AB 3-0 SH 27X BRD (SUTURE) ×2 IMPLANT
SYR TB 1ML LUER SLIP (SYRINGE) ×1 IMPLANT
TOWEL OR 17X24 6PK STRL BLUE (TOWEL DISPOSABLE) ×4 IMPLANT
TOWEL OR 17X26 10 PK STRL BLUE (TOWEL DISPOSABLE) ×5 IMPLANT
TRAY FOLEY CATH 14FRSI W/METER (CATHETERS) ×2 IMPLANT
TUBING EXTENTION W/L.L. (IV SETS) ×1 IMPLANT
UNDERPAD 30X30 INCONTINENT (UNDERPADS AND DIAPERS) ×2 IMPLANT
WATER STERILE IRR 1000ML POUR (IV SOLUTION) ×2 IMPLANT

## 2011-09-25 NOTE — Anesthesia Procedure Notes (Signed)
Procedure Name: Intubation Date/Time: 09/25/2011 8:42 AM Performed by: Marena Chancy Pre-anesthesia Checklist: Patient identified, Timeout performed, Emergency Drugs available, Suction available and Patient being monitored Patient Re-evaluated:Patient Re-evaluated prior to inductionOxygen Delivery Method: Circle system utilized Preoxygenation: Pre-oxygenation with 100% oxygen Intubation Type: IV induction Ventilation: Mask ventilation without difficulty and Oral airway inserted - appropriate to patient size Laryngoscope Size: Miller and 2 Grade View: Grade II Tube type: Oral Number of attempts: 1 Secured at: 23 cm Tube secured with: Tape Dental Injury: Teeth and Oropharynx as per pre-operative assessment

## 2011-09-25 NOTE — Op Note (Signed)
OPERATIVE REPORT  Date of Surgery: 09/25/2011  Surgeon: [name], MD  Assistant: Clearence Ped  Pre-op Diagnosis: Severe left superficial femoral popliteal and tibial occlusive disease with rest ischemia and severe claudication Post-op Diagnosis: Same  Procedure: Procedure(s): #1 left common femoral to anterior tibial bypass using a nonreversible translocated saphenous vein graft from left leg #2  INTRA OPERATIVE ARTERIOGRAM left leg Anesthesia: General  EBL: 100 cc  Complications: None  Procedure Details: Patient was taken to the operating room placed in the supine position at which time satisfactory general endotracheal anesthesia was administered. No foot for prep Betadine scrub and solution draped in routine sterile manner. The great saphenous vein was removed from the saphenofemoral junction to the distal calf through multiple incisions along the medial aspect of the left leg. Branches were ligated with 3 and 4 as her cousin divided it was gently dilated and marked for orientation purposes. The distal portion of the vein was an adequate being thickened and small in caliber. The adequate portion was then marked for orientation purposes and it appeared to be just long enough to reach the very proximal anterior tibial artery. Incision was made in the lateral aspect of the left leg over the anterior compartment near the origin of the anterior tibial artery. It was exposed as it exited from the interosseous membrane. It was severely diseased over its proximal few centimeters but then became widely patent and was 2 and half to 3 mm in size. Using the vein harvesting incision and inguinal area the common superficial and profunda femoris arteries were dissected free. There was a good pulse in the common femoral artery but superficial femoral artery is occluded. The profundus had a posterior plaque but was patent. Subcutaneous tunnels and created on the lateral aspect of the left leg and the  patient was heparinized. The femoral vessels were occluded with vascular clamps a longitudinal opening made the distalmost part of the common femoral artery extended down to the origin the superficial femoral artery with Potts scissors. The vessel was thickened but adnexal pulse and good inflow. The proximal and the vein was slightly spatulated and anastomosed end to side with 6-0 Prolene. Clamps were then released and the valves were rendered incompetent result Flow of the splenic vein graft. Vein was carefully delivered through the tunnel and delivered into the distal wound. The anterior tibial artery is occluded proximally and distally with Vesseloops a 15 blade extended with Potts scissors it would accept a 2-1/2 dilator easily had good backbleeding. There was just enough length on the vein to perform the bypass it was spatulated and anastomosed end to side with 6-0 Prolene vessel loops released and there was a good pulse and a 2-3+ range in the vein graft with good audible Doppler flow in the left ankle and foot which was brisk and monophasic. Intraoperative arteriogram was performed injecting 20 cc of contrast and this revealed vein graft to the marginal in size with the runoff via the anterior tibial artery down toward the ankle level. No protamine was given Windrow irrigated with saline closed in layers of Vicryl subcuticular fashion and Dermabond to the recovery room in stable condition   [name], MD 09/25/2011 12:14 PM

## 2011-09-25 NOTE — Progress Notes (Signed)
[employer]. She stated to callher if needed.  Did not receive form  Back from md.

## 2011-09-25 NOTE — Telephone Encounter (Signed)
Request was faxed x 2. Called short stay trying to reach [place] but unable to reach. Spoke w/Jane in Short stay A and pt has went to surgery already. Request was faxed 09-24-11 and refaxed on 7-24.

## 2011-09-25 NOTE — Progress Notes (Signed)
Utilization review completed.  

## 2011-09-25 NOTE — Anesthesia Postprocedure Evaluation (Signed)
  Anesthesia Post-op Note  Patient: [name]  Procedure(s) Performed: Procedure(s) (LRB): BYPASS GRAFT FEMORAL-TIBIAL ARTERY (Left) INTRA OPERATIVE ARTERIOGRAM (Left)  Patient Location: PACU  Anesthesia Type: General  Level of Consciousness: awake  Airway and Oxygen Therapy: Patient Spontanous Breathing  Post-op Pain: mild  Post-op Assessment: Post-op Vital signs reviewed  Post-op Vital Signs: stable  Complications: No apparent anesthesia complications

## 2011-09-25 NOTE — Preoperative (Signed)
Beta Blockers   Reason not to administer Beta Blockers:Not Applicable 

## 2011-09-25 NOTE — H&P (View-Only) (Signed)
[facility] [place]  Subjective  - Left lower extremity pain and tingling secondary to claudication.    Objective 132/81 72 98.4 F (36.9 C) (Oral) 18 99%  Intake/Output Summary (Last 24 hours) at 09/12/11 0746 Last data filed at 09/12/11 0549  Gross per 24 hour  Intake      0 ml  Output    575 ml  Net   -575 ml    Vascular Exam/Pulses: 2+ palpable femoral bilaterally  Faint peroneal doppler signal bilaterally  Right PT strong monophasic doppler signal/ no DP signal  Left DP monophasic doppler/ faint doppler PT  Extremities without ischemic changes, no Gangrene , no cellulitis; no open wounds;  Sensation decreased distally left toes compared to right.  Assessment/Planning:  Dx: severe occlusive disease and left lower extremity with left superficial femoral popliteal and tibial occlusive disease  Patient will also need cardiac clearance by Dr. Katrinka Blazing or Kennith Maes.  Patient would like to wait 2 weeks for proceeding with surgery. Currently he does not have true rest pain nor any ischemic ulcers or infection. If symptoms do not change I think it would be safe to wait 2 weeks. If he developed any ulcerations, worsening rest pain, or infection-than we should proceed with surgery sooner.  If renal function is stable on Thursday will be okay to discharge. I will look at vein mapping and have tentatively scheduled his surgery for Wednesday, July 24. Risks and benefits and potential failure of graft discussed with patient and wife    [facility] 09/12/2011 7:46 AM --  Laboratory Lab Results:  Basename 09/11/11 1622 09/10/11 1507  WBC 6.3 6.9  HGB 14.7 14.6  HCT 43.2 42.4  PLT 106* 157   BMET  Basename 09/12/11 0600 09/11/11 1622 09/11/11 0500  NA 141 -- 140  K 4.9 -- 4.2  CL 109 -- 105  CO2 24 -- 25  GLUCOSE 69* -- 83  BUN 29* -- 41*  CREATININE 2.12* 2.03* --  CALCIUM 8.5 -- 8.5    COAG Lab Results  Component Value Date   INR  1.07 09/11/2011   INR 1.10 12/25/2009   INR 2.1 11/01/2009   No results found for this basename: PTT    Antibiotics Anti-infectives    None         Today I reviewed the vein mapping of lower and upper extremities. Upper extremity veins are poor Lower extremity veins do not appear to be adequate below the mid thigh level. There is some areas of thickening of the vein as if there has been previous thrombophlebitis  On physical exam veins below the knee bilaterally appear small  I discussed at length with patient that a poor conduit will definitely affect patency right femoral tibial bypass. He is having quite severe symptoms with some rest component and I feel that he does need revascularization.  Renal function today is stable We'll get stress test as an outpatient per Dr. Katrinka Blazing Plan left femoral to anterior tibial bypass on Wednesday, July 24 with possible saphenous vein from both lower extremities  Risks and benefits discussed with patient and he understands and would like to proceed- Okay to discharge

## 2011-09-25 NOTE — Transfer of Care (Signed)
Immediate Anesthesia Transfer of Care Note  Patient: [name]  Procedure(s) Performed: Procedure(s) (LRB): BYPASS GRAFT FEMORAL-TIBIAL ARTERY (Left) INTRA OPERATIVE ARTERIOGRAM (Left)  Patient Location: PACU  Anesthesia Type: General  Level of Consciousness: awake, alert  and oriented  Airway & Oxygen Therapy: Patient Spontanous Breathing and Patient connected to nasal cannula oxygen  Post-op Assessment: Report given to PACU RN and Post -op Vital signs reviewed and stable  Post vital signs: Reviewed and stable  Complications: No apparent anesthesia complications

## 2011-09-25 NOTE — Anesthesia Preprocedure Evaluation (Addendum)
Anesthesia Evaluation  Patient identified by MRN, date of birth, ID band Patient awake    Reviewed: Allergy & Precautions, H&P , NPO status   History of Anesthesia Complications Negative for: history of anesthetic complications  Airway Mallampati: II  Neck ROM: Full    Dental  (+) Teeth Intact   Pulmonary neg pulmonary ROS, sleep apnea ,  breath sounds clear to auscultation        Cardiovascular hypertension, + CAD and +CHF + dysrhythmias Rhythm:Regular Rate:Normal     Neuro/Psych PSYCHIATRIC DISORDERS  Neuromuscular disease    GI/Hepatic   Endo/Other    Renal/GU      Musculoskeletal   Abdominal (+) + obese,   Peds  Hematology   Anesthesia Other Findings   Reproductive/Obstetrics                          Anesthesia Physical Anesthesia Plan  ASA: III  Anesthesia Plan: General   Post-op Pain Management:    Induction: Intravenous  Airway Management Planned: Oral ETT  Additional Equipment:   Intra-op Plan:   Post-operative Plan: Extubation in OR  Informed Consent: I have reviewed the patients History and Physical, chart, labs and discussed the procedure including the risks, benefits and alternatives for the proposed anesthesia with the patient or authorized representative who has indicated his/her understanding and acceptance.   Dental advisory given  Plan Discussed with: CRNA and Surgeon  Anesthesia Plan Comments:        Anesthesia Quick Evaluation

## 2011-09-25 NOTE — Interval H&P Note (Signed)
History and Physical Interval Note:  09/25/2011 7:56 AM  [name]  has presented today for surgery, with the diagnosis of Peripheral Vascular Disease  The various methods of treatment have been discussed with the patient and family. After consideration of risks, benefits and other options for treatment, the patient has consented to  Procedure(s) (LRB): BYPASS GRAFT FEMORAL-TIBIAL ARTERY (Left) as a surgical intervention .  The patient's history has been reviewed, patient examined, no change in status, stable for surgery.  I have reviewed the patient's chart and labs.  Questions were answered to the patient's satisfaction.     [name]

## 2011-09-26 ENCOUNTER — Encounter (HOSPITAL_COMMUNITY): Payer: Self-pay | Admitting: Vascular Surgery

## 2011-09-26 LAB — TYPE AND SCREEN
ABO/RH(D): AB POS
Antibody Screen: NEGATIVE

## 2011-09-26 LAB — BASIC METABOLIC PANEL
BUN: 32 mg/dL — ABNORMAL HIGH (ref 6–23)
CO2: 23 mEq/L (ref 19–32)
Chloride: 107 mEq/L (ref 96–112)
Creatinine, Ser: 1.99 mg/dL — ABNORMAL HIGH (ref 0.50–1.35)
GFR calc Af Amer: 36 mL/min — ABNORMAL LOW (ref >90)
Potassium: 4.9 mEq/L (ref 3.5–5.1)

## 2011-09-26 LAB — CBC
HCT: 35.8 % — ABNORMAL LOW (ref 39.0–52.0)
MCV: 86.5 fL (ref 78.0–100.0)
Platelets: 140 10*3/uL — ABNORMAL LOW (ref 150–400)
RBC: 4.14 MIL/uL — ABNORMAL LOW (ref 4.22–5.81)
RDW: 13.5 % (ref 11.5–15.5)
WBC: 7.3 10*3/uL (ref 4.0–10.5)

## 2011-09-26 LAB — GLUCOSE, CAPILLARY
Glucose-Capillary: 132 mg/dL — ABNORMAL HIGH (ref 70–99)
Glucose-Capillary: 79 mg/dL (ref 70–99)

## 2011-09-26 LAB — HEMOGLOBIN A1C: Mean Plasma Glucose: 143 mg/dL — ABNORMAL HIGH (ref <117)

## 2011-09-26 MED ORDER — DIPHENHYDRAMINE HCL 25 MG PO CAPS
25.0000 mg | ORAL_CAPSULE | Freq: Every evening | ORAL | Status: DC | PRN
  Administered 2011-09-26: 25 mg via ORAL
  Filled 2011-09-26: qty 1

## 2011-09-26 NOTE — Progress Notes (Signed)
I agree with the following treatment note after reviewing documentation.   Johnston, Andri Prestia Brynn   OTR/L Pager: [phone] Office: [phone] .   

## 2011-09-26 NOTE — Progress Notes (Addendum)
[facility] [place]  Progress Note Bypass Surgery  Date of Surgery: 09/25/2011  Procedure(s): Left BYPASS GRAFT FEMORAL-TIBIAL ARTERY - NRGSV INTRA OPERATIVE ARTERIOGRAM Surgeon: Surgeon(s): Pryor Ochoa, MD  1 Day Post-Op  History of Present Illness  [name] is a 77 y.o. male who is S/P Procedure(s): left BYPASS GRAFT FEMORAL-TIBIAL ARTERY INTRA OPERATIVE ARTERIOGRAM   The patient's pre-op symptoms of pain/claudication are Improved . Patients pain is well controlled.    VASC. LAB Studies:        ABI: pending  Significant Diagnostic Studies: CBC Lab Results  Component Value Date   WBC 7.3 09/26/2011   HGB 12.3* 09/26/2011   HCT 35.8* 09/26/2011   MCV 86.5 09/26/2011   PLT 140* 09/26/2011    BMET     Component Value Date/Time   NA 138 09/26/2011 0403   K 4.9 09/26/2011 0403   CL 107 09/26/2011 0403   CO2 23 09/26/2011 0403   GLUCOSE 122* 09/26/2011 0403   BUN 32* 09/26/2011 0403   CREATININE 1.99* 09/26/2011 0403   CALCIUM 8.1* 09/26/2011 0403   GFRNONAA 31* 09/26/2011 0403   GFRAA 36* 09/26/2011 0403    COAG Lab Results  Component Value Date   INR 1.07 09/11/2011   INR 1.10 12/25/2009   INR 2.1 11/01/2009   No results found for this basename: PTT    Physical Examination  BP Readings from Last 3 Encounters:  09/26/11 119/72  09/26/11 119/72  09/23/11 142/87   Temp Readings from Last 3 Encounters:  09/26/11 99.2 F (37.3 C) Oral  09/26/11 99.2 F (37.3 C) Oral  09/23/11 97.5 F (36.4 C) Oral   SpO2 Readings from Last 3 Encounters:  09/26/11 94%  09/26/11 94%  09/23/11 98%   Pulse Readings from Last 3 Encounters:  09/26/11 70  09/26/11 70  09/23/11 78    Pt is A&O x 3 left lower extremity: Incision/s is/are clean,dry.intact, and  healing without hematoma, erythema or drainage Limb is warm; with good color  Left Dorsalis Pedis pulse is palpable LeftPosterior tibial pulse is  weak  Assessment/Plan: Pt. Doing  well Post-op pain is controlled Wounds are healing well PT/OT for ambulation Continue wound care as ordered Transfer to floor  [name] [phone] 09/26/2011 7:29 AM        Agree with above Wounds healing nicely 3+ graft and 2+ dorsalis pedis pulse palpable left foot Foley discontinued Patient out of bed We'll begin ambulation and transfer 2000 Renal functions stable with creatinine 1.99 We'll plan to DC in the next few days depending on pain level and ambulation

## 2011-09-26 NOTE — Progress Notes (Signed)
Occupational Therapy Evaluation Patient Details Name: DAKIN MADANI MRN: [id number] DOB: 1934-06-12 Today's Date: 09/26/2011 Time: 4098-1191 OT Time Calculation (min): 19 min  OT Assessment / Plan / Recommendation Clinical Impression  77 y.o. pt. s/p L femoral-tibial bypass graft. Pt with good mobility, although still limited secondary to limited weightbearing. Pt will benefit from skilled OT in the acute care setting to maximize independence and safety in ADLs prior to d/c. OT to follow acutely.    OT Assessment  Patient needs continued OT Services    Follow Up Recommendations  No OT follow up;Supervision - Intermittent    Barriers to Discharge      Equipment Recommendations  Other (comment) (TBD)    Recommendations for Other Services    Frequency  Min 2X/week    Precautions / Restrictions Precautions Precautions: None Restrictions Weight Bearing Restrictions: No   Pertinent Vitals/Pain Pain 6/10 in LLE.     ADL  Grooming: Simulated;Set up Where Assessed - Grooming: Supported sitting Upper Body Bathing: Simulated;Set up Where Assessed - Upper Body Bathing: Supported sitting Upper Body Dressing: Simulated;Set up Where Assessed - Upper Body Dressing: Supported sitting Toilet Transfer: Simulated;Minimal assistance Toilet Transfer Method: Sit to stand Toilet Transfer Equipment: Raised toilet seat with arms (or 3-in-1 over toilet) Equipment Used: Rolling walker;Gait belt ADL Comments: Pt. performed sit to stand transfer with Min A. He ambulated well in session. With simulated UB dressing/bathing, pt. is at Setup A level while sitting supported.    OT Diagnosis: Acute pain  OT Problem List: Decreased activity tolerance;Impaired balance (sitting and/or standing);Pain OT Treatment Interventions: Self-care/ADL training;DME and/or AE instruction;Therapeutic activities;Patient/family education;Balance training   OT Goals Acute Rehab OT Goals OT Goal Formulation: With  patient Time For Goal Achievement: 10/10/11 Potential to Achieve Goals: Good ADL Goals Pt Will Perform Grooming: Standing at sink;with modified independence ADL Goal: Grooming - Progress: Goal set today Pt Will Perform Upper Body Bathing: Sitting at sink;with modified independence ADL Goal: Upper Body Bathing - Progress: Goal set today Pt Will Perform Lower Body Bathing: Sit to stand from chair;with modified independence ADL Goal: Lower Body Bathing - Progress: Goal set today Pt Will Perform Upper Body Dressing: with modified independence;Sitting, chair;Sitting, bed ADL Goal: Upper Body Dressing - Progress: Goal set today Pt Will Perform Lower Body Dressing: with modified independence;Sit to stand from chair;Sit to stand from bed ADL Goal: Lower Body Dressing - Progress: Goal set today Pt Will Transfer to Toilet: with modified independence;Ambulation;Regular height toilet ADL Goal: Toilet Transfer - Progress: Goal set today Pt Will Perform Toileting - Clothing Manipulation: with modified independence;Standing ADL Goal: Toileting - Clothing Manipulation - Progress: Goal set today Pt Will Perform Toileting - Hygiene: with modified independence;Leaning right and/or left on 3-in-1/toilet ADL Goal: Toileting - Hygiene - Progress: Goal set today  Visit Information  Last OT Received On: 09/26/11 Assistance Needed: +1 PT/OT Co-Evaluation/Treatment: Yes    Subjective Data   Pt. Pleasant in session.    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 [employer] Communication Communication: No difficulties Dominant Hand: Right      Cognition  Overall Cognitive Status: Appears  within functional limits for tasks assessed/performed Arousal/Alertness: Awake/alert Orientation Level: Appears intact for tasks assessed Behavior During Session: [facility] for tasks performed    Extremity/Trunk Assessment Right Upper Extremity Assessment RUE ROM/Strength/Tone: Within functional levels Left Upper Extremity Assessment LUE ROM/Strength/Tone: Within functional levels Right Lower Extremity Assessment RLE ROM/Strength/Tone: Within functional levels RLE Sensation: WFL - Light Touch Left Lower Extremity Assessment LLE ROM/Strength/Tone: Unable to fully assess;Due to pain LLE Sensation: History of peripheral neuropathy;Deficits LLE Sensation Deficits: increased pain in toes and ball of foot   Mobility Bed Mobility Bed Mobility: Not assessed Transfers Transfers: Sit to Stand;Stand to Sit Sit to Stand: 4: Min assist;With upper extremity assist;From chair/3-in-1 Stand to Sit: 4: Min assist;With upper extremity assist;To chair/3-in-1 Details for Transfer Assistance: VC for hand placement. Assist with anterior translation and stabilty from chair to RW           End of Session OT - End of Session Activity Tolerance: Patient tolerated treatment well Patient left: in chair;with call bell/phone within reach  GO     Marji Kuehnel, Mardella Layman 09/26/2011, 10:25 AM

## 2011-09-26 NOTE — Evaluation (Signed)
Physical Therapy Evaluation Patient Details Name: AZAM GERVASI MRN: [id number] DOB: 1934-12-10 Today's Date: 09/26/2011 Time: 4098-1191 PT Time Calculation (min): 79 min  PT Assessment / Plan / Recommendation Clinical Impression  Pt s/p L femoral-tibial bypass graft. Pt with good mobility, although still limited secondary to limited weightbearing. Pt will benefit from skilled PT in the acute care setting in order to maximize functional mobility and return to PLOF.    PT Assessment  Patient needs continued PT services    Follow Up Recommendations  No PT follow up;Supervision - Intermittent    Barriers to Discharge        Equipment Recommendations  Rolling walker with 5" wheels    Recommendations for Other Services     Frequency Min 4X/week    Precautions / Restrictions Precautions Precautions: None Restrictions Weight Bearing Restrictions: No         Mobility  Bed Mobility Bed Mobility: Not assessed Transfers Transfers: Sit to Stand;Stand to Sit Sit to Stand: 4: Min assist;With upper extremity assist;From chair/3-in-1 Stand to Sit: 4: Min assist;With upper extremity assist;To chair/3-in-1 Details for Transfer Assistance: VC for hand placement. Assist with anterior translation and stabilty from chair to RW Ambulation/Gait Ambulation/Gait Assistance: 4: Min guard Ambulation Distance (Feet): 250 Feet Assistive device: Rolling walker Ambulation/Gait Assistance Details: VC for proper sequencing and safety with distance to RW. Pt with decreased  weightbearing Gait Pattern: Decreased stance time - left;Decreased step length - right;Decreased hip/knee flexion - right;Antalgic;Decreased stride length Gait velocity: decreased gait speed    Exercises     PT Diagnosis: Abnormality of gait;Acute pain  PT Problem List: Decreased activity tolerance;Decreased balance;Decreased mobility;Decreased knowledge of use of DME;Decreased safety awareness;Decreased knowledge of  precautions;Pain PT Treatment Interventions: DME instruction;Gait training;Stair training;Functional mobility training;Therapeutic activities;Therapeutic exercise;Patient/family education   PT Goals Acute Rehab PT Goals PT Goal Formulation: With patient Time For Goal Achievement: 10/03/11 Potential to Achieve Goals: Good Pt will go Supine/Side to Sit: with modified independence PT Goal: Supine/Side to Sit - Progress: Goal set today Pt will go Sit to Supine/Side: with modified independence PT Goal: Sit to Supine/Side - Progress: Goal set today Pt will go Sit to Stand: with modified independence PT Goal: Sit to Stand - Progress: Goal set today Pt will go Stand to Sit: with modified independence PT Goal: Stand to Sit - Progress: Goal set today Pt will Transfer Bed to Chair/Chair to Bed: with modified independence PT Transfer Goal: Bed to Chair/Chair to Bed - Progress: Goal set today Pt will Ambulate: >150 feet;with modified independence;with least restrictive assistive device PT Goal: Ambulate - Progress: Goal set today  Visit Information  Last PT Received On: 09/26/11 Assistance Needed: +1 PT/OT Co-Evaluation/Treatment: Yes    Subjective Data      Prior Functioning  Home Living Lives With: Significant other Available Help at Discharge: Family;Available 24 hours/day Type of Home: House Home Access: Level entry Home Layout: One level Bathroom Shower/Tub: Tub/shower unit;Door [employer] Toilet: Standard Bathroom Accessibility: Yes How Accessible: Accessible via walker Home Adaptive Equipment: Straight cane Prior Function Level of Independence: Independent Able to Take Stairs?: Yes Driving: Yes 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[employer] Communication Communication: No difficulties Dominant Hand: Right    Cognition  Overall Cognitive Status: Appears within functional limits for tasks assessed/performed Arousal/Alertness: Awake/alert Orientation  Level: Appears intact for tasks assessed Behavior During Session: [facility] for tasks performed    Extremity/Trunk Assessment Right Lower Extremity Assessment RLE ROM/Strength/Tone: Within functional levels RLE Sensation: WFL -  Light Touch Left Lower Extremity Assessment LLE ROM/Strength/Tone: Unable to fully assess;Due to pain LLE Sensation: History of peripheral neuropathy;Deficits LLE Sensation Deficits: increased pain in toes and ball of foot   Balance    End of Session PT - End of Session Equipment Utilized During Treatment: Gait belt Activity Tolerance: Patient tolerated treatment well Patient left: in chair;with call bell/phone within reach Nurse Communication: Mobility status    Milana Kidney 09/26/2011, 10:10 AM  09/26/2011 Milana Kidney DPT PAGER: [phone] OFFICE: [phone]

## 2011-09-26 NOTE — Progress Notes (Signed)
Patient being transferred to [id number] via wheelchair. Phone report called to [place], Charity fundraiser. Patient and patient's children aware of the transfer. Patient was medicated for pain with one pain pill.

## 2011-09-27 ENCOUNTER — Telehealth: Payer: Self-pay | Admitting: Vascular Surgery

## 2011-09-27 LAB — GLUCOSE, CAPILLARY
Glucose-Capillary: 107 mg/dL — ABNORMAL HIGH (ref 70–99)
Glucose-Capillary: 91 mg/dL (ref 70–99)

## 2011-09-27 MED ORDER — HYDROCODONE-ACETAMINOPHEN 7.5-325 MG PO TABS: 1.0000 | ORAL_TABLET | ORAL | Status: AC | PRN

## 2011-09-27 NOTE — Progress Notes (Signed)
[facility] [place]  Progress Note Bypass Surgery  Date of Surgery: 09/25/2011  Procedure(s): left BYPASS GRAFT FEMORAL-TIBIAL ARTERY INTRA OPERATIVE ARTERIOGRAM Surgeon: Surgeon(s): Jonathan Ochoa, MD  2 Days Post-Op  History of Present Illness  [name] is a 77 y.o. male who is ready to go home. Ambulating well in the halls without pain except in left foot - reperfusion pain The patient's pre-op symptoms of severe limiting claudication are Improved . Patients pain is well controlled.    VASC. LAB Studies:        ABI: not done  Physical Examination  BP Readings from Last 3 Encounters:  09/27/11 126/71  09/27/11 126/71  09/23/11 142/87   Temp Readings from Last 3 Encounters:  09/27/11 99.5 F (37.5 C) Oral  09/27/11 99.5 F (37.5 C) Oral  09/23/11 97.5 F (36.4 C) Oral   SpO2 Readings from Last 3 Encounters:  09/27/11 90%  09/27/11 90%  09/23/11 98%   Pulse Readings from Last 3 Encounters:  09/27/11 71  09/27/11 71  09/23/11 78    Pt is A&O x 3 left upper extremity: Incision/s is/are clean,dry.intact, and  healing without hematoma, erythema or drainage Limb is warm; with good color  Left Dorsalis Pedis pulse is palpable Good graft pulse Assessment/Plan: Pt. Doing well Post-op pain is controlled Wounds are healing well PT/OT for ambulation D/C today F/U 3 weeks  [name] J [phone] 09/27/2011 11:04 AM

## 2011-09-27 NOTE — Telephone Encounter (Addendum)
Message copied by Shari Prows on Fri Sep 27, 2011  2:08 PM ------      Message from: Melene Plan      Created: Fri Sep 27, 2011 11:59 AM                   ----- Message -----         From: [name], PA         Sent: 09/27/2011  11:02 AM           To: Melene Plan, RN            F/U 2-3 weeks Dr. Hart [place]- left fem/ AT bypass  I scheduled an appt for the above pt for 10/15/11 at 2:15pm w/ jdl. I left message w/ pt's wife and also mailed letter. awt

## 2011-09-27 NOTE — Progress Notes (Signed)
Physical Therapy Treatment and Discharge Note Patient Details Name: [name] MRN: [id number] DOB: 29-May-1934 Today's Date: 09/27/2011 Time: 4098-1191 PT Time Calculation (min): 14 min  PT Assessment / Plan / Recommendation Comments on Treatment Session  Pt adm for LLE bypass graft.  Pt with excellent mobility and ready for dc from PT standpoint.    Follow Up Recommendations  No PT follow up    Barriers to Discharge        Equipment Recommendations  Cane    Recommendations for Other Services    Frequency     Plan All goals met and education completed, patient dischaged from PT services    Precautions / Restrictions Restrictions Weight Bearing Restrictions: No   Pertinent Vitals/Pain N/A    Mobility  Bed Mobility Bed Mobility: Supine to Sit Supine to Sit: 7: Independent Transfers Sit to Stand: 6: Modified independent (Device/Increase time);With upper extremity assist Stand to Sit: 6: Modified independent (Device/Increase time);With upper extremity assist Ambulation/Gait Ambulation/Gait Assistance: 6: Modified independent (Device/Increase time) Ambulation Distance (Feet): 250 Feet Assistive device: Straight cane Ambulation/Gait Assistance Details: Cues to use cane in LUE. Gait velocity: functional gait speed. Stairs:  (instructed pt to step up with RLE first, down with LLE first)    Exercises     PT Diagnosis:    PT Problem List:   PT Treatment Interventions:     PT Goals Acute Rehab PT Goals PT Goal: Supine/Side to Sit - Progress: Met PT Goal: Sit to Supine/Side - Progress: Met PT Goal: Sit to Stand - Progress: Met PT Goal: Stand to Sit - Progress: Met PT Transfer Goal: Bed to Chair/Chair to Bed - Progress: Met PT Goal: Ambulate - Progress: Met  Visit Information  Last PT Received On: 09/27/11 Assistance Needed: +1    Subjective Data  Subjective: Pt's family states that pt won't use a walker at home and would like a cane.   Cognition  Overall  Cognitive Status: Appears within functional limits for tasks assessed/performed Arousal/Alertness: Awake/alert Orientation Level: Appears intact for tasks assessed Behavior During Session: [facility] for tasks performed    Balance  Static Standing Balance Static Standing - Balance Support: No upper extremity supported Static Standing - Level of Assistance: 7: Independent  End of Session PT - End of Session Activity Tolerance: Patient tolerated treatment well Patient left: in bed;with call bell/phone within reach;with family/visitor present (sitting EOB) Nurse Communication: Mobility status   GP     [name] 09/27/2011, 11:15 AM  Skip Mayer PT [phone]

## 2011-09-27 NOTE — Discharge Summary (Signed)
[facility] Discharge Summary   Patient ID:  [name]: [id number] DOB/AGE: Dec 06, 1934 77 y.o.  Admit date: 09/25/2011 Discharge date: 09/27/2011 Date of Surgery: 09/25/2011 Surgeon: Surgeon(s): Pryor Ochoa, MD  Admission Diagnosis: Peripheral Vascular Disease  Discharge Diagnoses:  Peripheral Vascular Disease  Secondary Diagnoses: Past Medical History  Diagnosis Date  . Hyperlipidemia   . Hypertension   . Gout   . Benign prostatic hypertrophy   . Atrial fibrillation     sees Dr. Verdis Prime  . Chronic systolic dysfunction of left ventricle     a. mixed ischemic and nonischemic CM,  EF 35%. b. s/p AICD implantation.  . Diabetes mellitus     sees Dr. Everardo All   . CAD (coronary artery disease)     a. s/p mid LAD stenting with DES 2008  . CHF (congestive heart failure)   . PAD (peripheral artery disease)     Severe by PV angiogram 09/2011  . ED (erectile dysfunction)   . Renal artery stenosis     s/p stenting 2009    Procedure(s): BYPASS GRAFT FEMORAL-TIBIAL ARTERY INTRA OPERATIVE ARTERIOGRAM  Discharged Condition: good  HPI:  [name] is a 77 y.o. male intermittent smoker with hx of DM, HTN, CAD (stented), renal artery stenosis (stented) and AICD placement who was fairly active until about 6 months ago when he began to experience pain and cramping in his left calf. Prior to this he was walking in a pool for exercise. This progressed to night pain (improved in dependent position) and claudication at 15 feet. Pt denies any nonhealing ulcers in either LE. He does C/O 2nd toe pain on left with palpation or pressure.  He had a peripheral arteriogram which showed left SFA occlusion, reconstitution at the above knee pop and occlusion of below knee pop with anterior tibial runoff and small PT. Pt has neuropathy and cannot move right toes sec to previous surgery. He was admitted for a left femoral to AT bypass with vein   Hospital Course:    [name] is a 76 y.o. male is S/P Left Procedure(s): BYPASS GRAFT FEMORAL-TIBIAL ARTERY with NRGSV INTRA OPERATIVE ARTERIOGRAM Extubated: POD # 0 Post-op wounds healing well Pt. Ambulating, voiding and taking PO diet without difficulty. Palpable DP pulse and good graft pulse in the foot on left. C/o some reperfusion pain in the left foot but severe life-limiting claudication is much improved Pt pain controlled with PO pain meds. Labs as below Complications:none  Consults:  Treatment Team:  Lyn Records III, MD  Significant Diagnostic Studies: CBC Lab Results  Component Value Date   WBC 7.3 09/26/2011   HGB 12.3* 09/26/2011   HCT 35.8* 09/26/2011   MCV 86.5 09/26/2011   PLT 140* 09/26/2011    BMET    Component Value Date/Time   NA 138 09/26/2011 0403   K 4.9 09/26/2011 0403   CL 107 09/26/2011 0403   CO2 23 09/26/2011 0403   GLUCOSE 122* 09/26/2011 0403   BUN 32* 09/26/2011 0403   CREATININE 1.99* 09/26/2011 0403   CALCIUM 8.1* 09/26/2011 0403   GFRNONAA 31* 09/26/2011 0403   GFRAA 36* 09/26/2011 0403   COAG Lab Results  Component Value Date   INR 1.07 09/11/2011   INR 1.10 12/25/2009   INR 2.1 11/01/2009     Disposition:  Discharge to :Home Discharge Orders    Future Appointments: Provider: Department: Dept Phone: Center:   10/03/2011 12:50 PM [facility] [place] [phone] [facility]     Future Orders Please Complete By Expires   Resume previous diet      Call MD for:  temperature >100.5      Call MD for:  redness, tenderness, or signs of infection (pain, swelling, bleeding, redness, odor or green/yellow discharge around incision site)      Call MD for:  severe or increased pain, loss or decreased feeling  in affected limb(s)      Increase activity slowly      Comments:   Walk with assistance use walker or cane as needed   May shower       No dressing needed      may wash over wound with mild soap and water      Driving  Restrictions      Comments:   No driving for 3 weeks      Kamauri, Denardo  Home Medication Instructions AVW:[id number]   Printed on:09/27/11 1108  Medication Information                    insulin aspart protamine-insulin aspart (NOVOLOG 70/30) (70-30) 100 UNIT/ML injection Inject 6-10 Units into the skin 2 (two) times daily with a meal. 10 units with breakfast, and 6 units with the evening meal           vitamin E 1000 UNIT capsule Take 1,000 Units by mouth daily.             glucose blood test strip Use as instructed           HYDROcodone-acetaminophen (NORCO) 7.5-325 MG per tablet Take 1 tablet by mouth every 6 (six) hours as needed.           Pyridoxine HCl (VITAMIN B-6 PO) Take by mouth daily.           gabapentin (NEURONTIN) 300 MG capsule Take 300 mg by mouth daily.           aspirin 81 MG EC tablet Take 1 tablet (81 mg total) by mouth daily.           furosemide (LASIX) 40 MG tablet Take 1 tablet (40 mg total) by mouth daily.           carvedilol (COREG) 6.25 MG tablet Take 6.25 mg by mouth 1 day or 1 dose.            HYDROcodone-acetaminophen (NORCO) 7.5-325 MG per tablet Take 1 tablet by mouth every 4 (four) hours as needed.            Verbal and written Discharge instructions given to the patient. Wound care per Discharge AVS Follow-up Information    Follow up with [name], MD in 3 weeks. (office will arrange-sent)    Contact information:   [address] [place] [address] [phone]          Signed: [name] 09/27/2011, 11:08 AM

## 2011-09-27 NOTE — Progress Notes (Signed)
Pt has received discharge instructions, follow up appointment, and prescriptions.  Pt and family have no questions or concerns.  Discharged with volunteer services via wheelchair.

## 2011-09-30 ENCOUNTER — Other Ambulatory Visit: Payer: Self-pay

## 2011-09-30 ENCOUNTER — Telehealth: Payer: Self-pay | Admitting: Family Medicine

## 2011-09-30 ENCOUNTER — Other Ambulatory Visit: Payer: Self-pay | Admitting: *Deleted

## 2011-09-30 MED ORDER — ONETOUCH ULTRA 2 W/DEVICE KIT: 1.0000 | PACK | Freq: Once | Status: DC

## 2011-09-30 MED ORDER — GLUCOSE BLOOD VI STRP
ORAL_STRIP | Status: DC
Start: 2011-09-30 — End: 2011-10-28

## 2011-09-30 MED ORDER — ONETOUCH DELICA LANCETS FINE MISC: 1.0000 | Freq: Two times a day (BID) | Status: DC

## 2011-09-30 MED ORDER — GLUCOSE BLOOD VI STRP: ORAL_STRIP | Status: DC

## 2011-09-30 NOTE — Telephone Encounter (Signed)
Rx for Prodigy test strips was sent in this AM by PCP's office, they were informed that Prodigy supplies are no longer available. Pt needs rx sent in for [facility] and supplies to pharmacy. PCP's office will inform pt that new meter and supplies have been sent. Rx sent to [facility].

## 2011-09-30 NOTE — Telephone Encounter (Signed)
Patient called stating that he would like to have samples for his prodigy glucose meter. Please assist. Patient only has two left. He can be reached at [phone].

## 2011-09-30 NOTE — Telephone Encounter (Signed)
I sent scripts e-scribe. 

## 2011-09-30 NOTE — Telephone Encounter (Signed)
Pharmacy called stating that the  Prodigy machine is discontinues and they can no long get test strips for it. Pharmacy stated that they had a coupon they would give the pt for a one touch meter. Requesting a new script be sent in to One Touch Meter, Test Strips and Lancets

## 2011-09-30 NOTE — Telephone Encounter (Signed)
I left voice message, we do not have any samples for that machine.

## 2011-09-30 NOTE — Telephone Encounter (Signed)
I spoke with nurse from Dr. George Hugh office and she is going to order the below scripts and send to pharmacy. I spoke with pt and advised him to check with pharmacy today and make sure they are ready.

## 2011-09-30 NOTE — Telephone Encounter (Signed)
Rx refill request completed by PCP's office.

## 2011-09-30 NOTE — Telephone Encounter (Signed)
Patient called stating that he would like to have a refill of test strips for his prodigy glucose meter sent to [facility] [address]. Patient only has two left. Please assist.

## 2011-10-03 ENCOUNTER — Encounter: Payer: Self-pay | Admitting: Internal Medicine

## 2011-10-03 ENCOUNTER — Ambulatory Visit (INDEPENDENT_AMBULATORY_CARE_PROVIDER_SITE_OTHER): Payer: Medicare Other | Admitting: *Deleted

## 2011-10-03 DIAGNOSIS — I509 Heart failure, unspecified: Secondary | ICD-10-CM

## 2011-10-03 DIAGNOSIS — I5022 Chronic systolic (congestive) heart failure: Secondary | ICD-10-CM

## 2011-10-03 DIAGNOSIS — I428 Other cardiomyopathies: Secondary | ICD-10-CM

## 2011-10-08 LAB — REMOTE ICD DEVICE
AL IMPEDENCE ICD: 323 Ohm
BATTERY VOLTAGE: 3.0605 V
CHARGE TIME: 9.519 s
FVT: 0
PACEART VT: 0
RV LEAD AMPLITUDE: 14.5 mv
RV LEAD THRESHOLD: 0.875 V
TOT-0001: 1
TZAT-0001ATACH: 1
TZAT-0002ATACH: NEGATIVE
TZAT-0002ATACH: NEGATIVE
TZAT-0005SLOWVT: 88 pct
TZAT-0005SLOWVT: 91 pct
TZAT-0011SLOWVT: 10 ms
TZAT-0011SLOWVT: 10 ms
TZAT-0012ATACH: 150 ms
TZAT-0012SLOWVT: 200 ms
TZAT-0012SLOWVT: 200 ms
TZAT-0013SLOWVT: 2
TZAT-0018ATACH: NEGATIVE
TZAT-0018SLOWVT: NEGATIVE
TZAT-0018SLOWVT: NEGATIVE
TZAT-0019ATACH: 6 V
TZAT-0019ATACH: 6 V
TZAT-0019FASTVT: 8 V
TZAT-0020ATACH: 1.5 ms
TZAT-0020FASTVT: 1.5 ms
TZON-0003SLOWVT: 320 ms
TZON-0004SLOWVT: 28
TZON-0005SLOWVT: 12
TZST-0001ATACH: 5
TZST-0001FASTVT: 3
TZST-0001SLOWVT: 4
TZST-0001SLOWVT: 6
TZST-0002ATACH: NEGATIVE
TZST-0002FASTVT: NEGATIVE
TZST-0002FASTVT: NEGATIVE
TZST-0002FASTVT: NEGATIVE
TZST-0003SLOWVT: 35 J
VENTRICULAR PACING ICD: 94.81 pct

## 2011-10-14 ENCOUNTER — Encounter: Payer: Self-pay | Admitting: Vascular Surgery

## 2011-10-15 ENCOUNTER — Ambulatory Visit (INDEPENDENT_AMBULATORY_CARE_PROVIDER_SITE_OTHER): Payer: Medicare Other | Admitting: Vascular Surgery

## 2011-10-15 ENCOUNTER — Encounter: Payer: Self-pay | Admitting: Vascular Surgery

## 2011-10-15 VITALS — BP 150/91 | HR 81 | Resp 20 | Ht 68.0 in | Wt 182.0 lb

## 2011-10-15 DIAGNOSIS — I70219 Atherosclerosis of native arteries of extremities with intermittent claudication, unspecified extremity: Secondary | ICD-10-CM

## 2011-10-15 DIAGNOSIS — Z48812 Encounter for surgical aftercare following surgery on the circulatory system: Secondary | ICD-10-CM

## 2011-10-15 NOTE — Progress Notes (Signed)
Subjective:     Patient ID: [name], male   DOB: 10/15/1934, 77 y.o.   MRN: [id number]  HPI this 77 year old male returns for initial followup regarding his left femoral to anterior tibial bypass graft done for ischemia of the left lower extremity and severe claudication with rest pain. He is doing very well from the standpoint of his claudication but does have significant ischemic neuropathy with burning and stinging discomfort in the left leg as well as hypersensitivity. Past Medical History  Diagnosis Date  . Hyperlipidemia   . Hypertension   . Gout   . Benign prostatic hypertrophy   . Atrial fibrillation     sees Dr. Verdis Prime  . Chronic systolic dysfunction of left ventricle     a. mixed ischemic and nonischemic CM,  EF 35%. b. s/p AICD implantation.  . Diabetes mellitus     sees Dr. Everardo All   . CAD (coronary artery disease)     a. s/p mid LAD stenting with DES 2008  . CHF (congestive heart failure)   . PAD (peripheral artery disease)     Severe by PV angiogram 09/2011  . ED (erectile dysfunction)   . Renal artery stenosis     s/p stenting 2009    History  Substance Use Topics  . Smoking status: Never Smoker   . Smokeless tobacco: Never Used   Comment: 1-2 cigars when golfing  . Alcohol Use: 1.5 oz/week    3 drink(s) per week     occasional    Family History  Problem Relation Age of Onset  . Cancer      breast/fhx  . Heart disease      fhx  . Diabetes Neg Hx   . Cancer Father     Allergies  Allergen Reactions  . Lisinopril     REACTION: hives Patient doesn't recall this    Current outpatient prescriptions:aspirin 81 MG EC tablet, Take 1 tablet (81 mg total) by mouth daily., Disp: , Rfl: ;  Blood Glucose Monitoring Suppl (ONE TOUCH ULTRA 2) W/DEVICE KIT, 1 Device by Does not apply route once., Disp: 1 each, Rfl: 0;  carvedilol (COREG) 6.25 MG tablet, Take 6.25 mg by mouth 1 day or 1 dose. , Disp: , Rfl: ;  furosemide (LASIX) 40 MG tablet, Take 1 tablet  (40 mg total) by mouth daily., Disp: 30 tablet, Rfl:  gabapentin (NEURONTIN) 300 MG capsule, Take 300 mg by mouth daily., Disp: , Rfl: ;  glucose blood (ONE TOUCH ULTRA TEST) test strip, Use as instructed to check blood sugar twice daily dx 250.73, Disp: 100 each, Rfl: 4;  HYDROcodone-acetaminophen (NORCO) 7.5-325 MG per tablet, Take 1 tablet by mouth every 6 (six) hours as needed., Disp: 100 tablet, Rfl: 1 insulin aspart protamine-insulin aspart (NOVOLOG 70/30) (70-30) 100 UNIT/ML injection, Inject 6-10 Units into the skin 2 (two) times daily with a meal. 10 units with breakfast, and 6 units with the evening meal, Disp: , Rfl: ;  ONETOUCH DELICA LANCETS FINE MISC, 1 each by Does not apply route 2 (two) times daily. Dx 250.73, Disp: 100 each, Rfl: 4;  Pyridoxine HCl (VITAMIN B-6 PO), Take by mouth daily., Disp: , Rfl:  vitamin E 1000 UNIT capsule, Take 1,000 Units by mouth daily.  , Disp: , Rfl:   BP 150/91  Pulse 81  Resp 20  Ht 5\' 8"  (1.727 m)  Wt 182 lb (82.555 kg)  BMI 27.67 kg/m2  Body mass index is 27.67 kg/(m^2).  Review of Systems     Objective:   Physical ExamBP 150/91  Pulse 81  Resp 20  Ht 5\' 8"  (1.727 m)  Wt 182 lb (82.555 kg)  BMI 27.67 kg/m2  General well-developed well-nourished male no apparent stress alert and oriented x3 Left leg with incisions healing fairly well a few eschars distally particularly in the inguinal wound and distal medial vein harvesting incision. No evidence of infection. 3+ graft pulses in the subcutaneous position laterally and 3+ dorsalis pedis pulse palpable. 1+ distal edema.    Assessment:     Doing well post left femoral to anterior tibial bypass for severe ischemia left leg    Plan:     Return in 2 months a duplex scan of the graft and ABIs. Instructed patient regarding checking graft pulse at the knee level. Given refill for Vicodin 7.5/500-#40 tablets

## 2011-10-16 NOTE — Addendum Note (Signed)
Addended by: Sharee Pimple on: 10/16/2011 08:35 AM   Modules accepted: Orders

## 2011-10-23 ENCOUNTER — Encounter: Payer: Self-pay | Admitting: *Deleted

## 2011-10-27 ENCOUNTER — Other Ambulatory Visit: Payer: Self-pay | Admitting: Family Medicine

## 2011-10-28 ENCOUNTER — Other Ambulatory Visit: Payer: Self-pay

## 2011-10-28 MED ORDER — GLUCOSE BLOOD VI STRP
ORAL_STRIP | Status: DC
Start: 2011-10-28 — End: 2011-10-29

## 2011-10-29 ENCOUNTER — Other Ambulatory Visit: Payer: Self-pay

## 2011-10-29 MED ORDER — GLUCOSE BLOOD VI STRP: 1.0000 | ORAL_STRIP | Freq: Two times a day (BID) | Status: DC

## 2011-11-21 ENCOUNTER — Other Ambulatory Visit: Payer: Self-pay | Admitting: *Deleted

## 2011-11-21 ENCOUNTER — Ambulatory Visit (INDEPENDENT_AMBULATORY_CARE_PROVIDER_SITE_OTHER): Payer: Medicare Other | Admitting: Vascular Surgery

## 2011-11-21 ENCOUNTER — Encounter (INDEPENDENT_AMBULATORY_CARE_PROVIDER_SITE_OTHER): Payer: Medicare Other | Admitting: *Deleted

## 2011-11-21 ENCOUNTER — Encounter: Payer: Self-pay | Admitting: Vascular Surgery

## 2011-11-21 DIAGNOSIS — M79609 Pain in unspecified limb: Secondary | ICD-10-CM

## 2011-11-21 DIAGNOSIS — I739 Peripheral vascular disease, unspecified: Secondary | ICD-10-CM

## 2011-11-21 DIAGNOSIS — Z48812 Encounter for surgical aftercare following surgery on the circulatory system: Secondary | ICD-10-CM

## 2011-11-21 DIAGNOSIS — R600 Localized edema: Secondary | ICD-10-CM

## 2011-11-21 DIAGNOSIS — R609 Edema, unspecified: Secondary | ICD-10-CM

## 2011-11-21 DIAGNOSIS — M7989 Other specified soft tissue disorders: Secondary | ICD-10-CM

## 2011-11-21 MED ORDER — TRAMADOL HCL 50 MG PO TABS: 50.0000 mg | ORAL_TABLET | Freq: Four times a day (QID) | ORAL | Status: DC | PRN

## 2011-12-06 ENCOUNTER — Telehealth: Payer: Self-pay

## 2011-12-06 ENCOUNTER — Other Ambulatory Visit: Payer: Self-pay | Admitting: Vascular Surgery

## 2011-12-06 NOTE — Telephone Encounter (Signed)
Phone call from pt. With request of refill on Tramadol.  Stated his left foot started hurting about 2 hrs. ago and Tylenol hasn't helped.  Rates pain at 9/10.  Stated has a little numbness in toes of left foot, but states that has been there since surgery.  Reports color (L) foot is pale pink.  Denies any swelling of (L) foot or leg.  Advised pt. that due to fact he is more than 2 mos. post-op, he would need to be evaluated before pain medication can be prescribed.  Advised pt. if his pain is so severe, he would need to go to the ER, or urgent care today.  Stated he does not want to go to the ER, due to all of the medical bills he has already incurred.   Pt. stated he is going to call his endocrinologist, since he has neuropathy in the left leg.  Pt. Has a f/u appt. On 10/15 with Dr. Hart Rochester.  Advised to call office on Monday, if feels his symptoms have not improved.  Strongly advised to go to the ER with worsening sx's over the weekend.  Verb. understanding.

## 2011-12-09 ENCOUNTER — Encounter: Payer: Self-pay | Admitting: Family Medicine

## 2011-12-09 ENCOUNTER — Ambulatory Visit (INDEPENDENT_AMBULATORY_CARE_PROVIDER_SITE_OTHER): Payer: Medicare Other | Admitting: Family Medicine

## 2011-12-09 VITALS — BP 118/78 | Temp 97.7°F | Wt 183.0 lb

## 2011-12-09 DIAGNOSIS — Z23 Encounter for immunization: Secondary | ICD-10-CM

## 2011-12-09 DIAGNOSIS — M79609 Pain in unspecified limb: Secondary | ICD-10-CM

## 2011-12-09 MED ORDER — GABAPENTIN 300 MG PO CAPS: 300.0000 mg | ORAL_CAPSULE | Freq: Three times a day (TID) | ORAL | Status: DC

## 2011-12-09 MED ORDER — TRAMADOL HCL 50 MG PO TABS: 50.0000 mg | ORAL_TABLET | Freq: Every evening | ORAL | Status: DC | PRN

## 2011-12-09 NOTE — Patient Instructions (Addendum)
-  increase Gabapentin according to instructions - follow up with your doctor in 1 month to see if this is working, there is room to go up on this if needed for your nerve related pain.  -can use tramadol at night only and only as needed - this is not a good medication to be on longterm so please try to use a s little as possible and do nt take with other sedating drugs or with alcohol  -CALL YOUR SURGEON today and let them know about drainage and get an appointment to check on wounds asap  -As we discussed, we have prescribed a new medication for you at this appointment. We discussed the common and serious potential adverse effects of this medication and you can review these and more with the pharmacist when you pick up your medication.  Please follow the instructions for use carefully and notify us immediately if you have any problems taking this medication.

## 2011-12-09 NOTE — Progress Notes (Signed)
Chief Complaint  Patient presents with  . drainage on left leg    bypass vein surgery on 09/25/11;     HPI:  Complicated pt here for urgent visit with me today for neuropathic pain in R foot. Has had L lat neuropathic foot pain chronically and on Gabapentin for this but this is not helping enough. Pain is most annoying at night and used to take vicodin at night for this. Has taken tramadol at night for this as well which worked well. Wanted refill on tramadol, but told he needed appointment before refill could be provided. Hx of claudication s/p vascular surgery left femoral to anterior tibial bypass graft done for ischemia of the left lower extremity a few months ago. Also has had a little drainage for incision site on distal leg for a few days. Denies fevers, chills, or redness at incision site. Only taking gabapentin 300mg  daily   ROS: See pertinent positives and negatives per HPI.  Past Medical History  Diagnosis Date  . Hyperlipidemia   . Hypertension   . Gout   . Benign prostatic hypertrophy   . Atrial fibrillation     sees Dr. Verdis Prime  . Chronic systolic dysfunction of left ventricle     a. mixed ischemic and nonischemic CM,  EF 35%. b. s/p AICD implantation.  . Diabetes mellitus     sees Dr. Everardo All   . CAD (coronary artery disease)     a. s/p mid LAD stenting with DES 2008  . CHF (congestive heart failure)   . PAD (peripheral artery disease)     Severe by PV angiogram 09/2011  . ED (erectile dysfunction)   . Renal artery stenosis     s/p stenting 2009    Family History  Problem Relation Age of Onset  . Cancer      breast/fhx  . Heart disease      fhx  . Diabetes Neg Hx   . Cancer Father     History   Social History  . Marital Status: Widowed    Spouse Name: N/A    Number of Children: N/A  . Years of Education: N/A   Occupational History  . Retired    Social History Main Topics  . Smoking status: Never Smoker   . Smokeless tobacco: Never  Used   Comment: 1-2 cigars when golfing  . Alcohol Use: 1.5 oz/week    3 drink(s) per week     occasional  . Drug Use: No  . Sexually Active: None   Other Topics Concern  . None   Social History Narrative   Lives in [place] with significant other,  [name].    Current outpatient prescriptions:allopurinol (ZYLOPRIM) 100 MG tablet, TAKE 1 TABLET (100 MG TOTAL) BY MOUTH DAILY., Disp: 30 tablet, Rfl: 11;  aspirin 81 MG EC tablet, Take 1 tablet (81 mg total) by mouth daily., Disp: , Rfl: ;  Blood Glucose Monitoring Suppl (ONE TOUCH ULTRA 2) W/DEVICE KIT, 1 Device by Does not apply route once., Disp: 1 each, Rfl: 0;  carvedilol (COREG) 6.25 MG tablet, Take 6.25 mg by mouth 1 day or 1 dose. , Disp: , Rfl:  furosemide (LASIX) 40 MG tablet, Take 1 tablet (40 mg total) by mouth daily., Disp: 30 tablet, Rfl: ;  gabapentin (NEURONTIN) 300 MG capsule, Take 1 capsule (300 mg total) by mouth 3 (three) times daily., Disp: 90 capsule, Rfl: 1;  glucose blood test strip, 1 each by Other route 2 (two) times daily.  Dx 250.73, Disp: 100 each, Rfl: 12 insulin aspart protamine-insulin aspart (NOVOLOG 70/30) (70-30) 100 UNIT/ML injection, Inject 6-10 Units into the skin 2 (two) times daily with a meal. 10 units with breakfast, and 6 units with the evening meal, Disp: , Rfl: ;  ONETOUCH DELICA LANCETS FINE MISC, 1 each by Does not apply route 2 (two) times daily. Dx 250.73, Disp: 100 each, Rfl: 4;  Pyridoxine HCl (VITAMIN B-6 PO), Take by mouth daily., Disp: , Rfl:  vitamin E 1000 UNIT capsule, Take 1,000 Units by mouth daily.  , Disp: , Rfl: ;  DISCONTD: gabapentin (NEURONTIN) 300 MG capsule, Take 300 mg by mouth daily., Disp: , Rfl: ;  traMADol (ULTRAM) 50 MG tablet, Take 1 tablet (50 mg total) by mouth at bedtime as needed for pain., Disp: 30 tablet, Rfl: 0  EXAM:  Filed Vitals:   12/09/11 1123  BP: 118/78  Temp: 97.7 F (36.5 C)    There is no height on file to calculate BMI.  GENERAL: vitals reviewed  and listed below, alert, oriented, appears well hydrated and in no acute distress  HEENT: atraumatic, conjucntiva clear, no obvious abnormalities on inspection of external nose and ears  MS: moves all extremities without noticeable abnormality  SKIN: small eschar formation on incision sites distal L LE with small amount of serosanguinous drainage from two sites, no pus, induration or erythema of skin.  PSYCH: pleasant and cooperative, no obvious depression or anxiety  ASSESSMENT AND PLAN:  Discussed the following assessment and plan:  1. Pain in limb  gabapentin (NEURONTIN) 300 MG capsule, traMADol (ULTRAM) 50 MG tablet   -discussed tx options for neuropathic pain. On extremely low dose of gabapentin - titrating up and gave small amount of tramadol to help with nighttime pain while titrating gabapentin - follow up with PCP -advised pt to notify surgeon's office of drainage from incisions and get in to see them ASAP within the next few days and see a doctor immediatly if any worsening - use bacitracin in meantime   No orders of the defined types were placed in this encounter.    Patient Instructions  -increase Gabapentin according to instructions - follow up with your doctor in 1 month to see if this is working, there is room to go up on this if needed for your nerve related pain.  -can use tramadol at night only and only as needed - this is not a good medication to be on longterm so please try to use a s little as possible and do nt take with other sedating drugs or with alcohol  -CALL YOUR SURGEON today and let them know about drainage and get an appointment to check on wounds asap    Return to clinic immediately if symptoms worsen or persist or new concerns.  No Follow-up on file.  Kriste Basque R.

## 2011-12-16 ENCOUNTER — Encounter: Payer: Self-pay | Admitting: Vascular Surgery

## 2011-12-17 ENCOUNTER — Encounter (INDEPENDENT_AMBULATORY_CARE_PROVIDER_SITE_OTHER): Payer: Medicare Other | Admitting: *Deleted

## 2011-12-17 ENCOUNTER — Ambulatory Visit (INDEPENDENT_AMBULATORY_CARE_PROVIDER_SITE_OTHER): Payer: Medicare Other | Admitting: Vascular Surgery

## 2011-12-17 ENCOUNTER — Encounter: Payer: Self-pay | Admitting: Vascular Surgery

## 2011-12-17 VITALS — BP 100/69 | HR 77 | Ht 68.0 in | Wt 184.0 lb

## 2011-12-17 DIAGNOSIS — I70219 Atherosclerosis of native arteries of extremities with intermittent claudication, unspecified extremity: Secondary | ICD-10-CM | POA: Insufficient documentation

## 2011-12-17 DIAGNOSIS — Z48812 Encounter for surgical aftercare following surgery on the circulatory system: Secondary | ICD-10-CM

## 2011-12-17 DIAGNOSIS — I739 Peripheral vascular disease, unspecified: Secondary | ICD-10-CM

## 2011-12-17 NOTE — Progress Notes (Signed)
Subjective:     Patient ID: [name], male   DOB: 04/16/1934, 77 y.o.   MRN: [id number]  HPI this 77-year-old male had left femoral to proximal anterior tibial bypass graft performed by me July 24 for ischemic left foot and severe claudication. Graft has been functioning nicely. He does have chronic burning and numbness in the lateral aspect of the left foot and lower leg due to ischemic neuropathy but it is improving with GABApentin. He denies severe claudication symptoms at this time  Review of Systems     Objective:   Physical ExamBP 100/69  Pulse 77  Ht 5' 8" (1.727 m)  Wt 184 lb (83.462 kg)  BMI 27.98 kg/m2  SpO2 100%  General well-developed well-nourished male no apparent stress alert and oriented x3 Left lower extremity with 3+ femoral and subcutaneous graft pulse and 2+ dorsalis pedis pulse palpable. 3+ femoral pulse on the right is palpable. Left foot is well-perfused.   Today I ordered a venous duplex scan of the graft which are reviewed and interpreted. Graft is widely patent with a normal ABI 1.0. There is a stenosis in the very distal aspect of the vein graft with velocity of 416 cm/s which appears quite focal and may represent a valve cusp     Assessment:     Patent left femoral anterior tibial bypass graft placed July 23 pain with possible severe distal vein graft stenosis    Plan:     Plan angiography of left leg via a right common femoral approach by Dr. Brabham on October 22 with possible PTA of distal vein graft stenosis if appropriate      

## 2011-12-18 ENCOUNTER — Encounter (HOSPITAL_COMMUNITY): Payer: Self-pay | Admitting: Pharmacy Technician

## 2011-12-23 ENCOUNTER — Other Ambulatory Visit: Payer: Self-pay

## 2011-12-24 MED ORDER — SODIUM CHLORIDE 0.9 % IV SOLN
INTRAVENOUS | Status: DC
  Administered 2011-12-25: 07:00:00 via INTRAVENOUS

## 2011-12-25 ENCOUNTER — Encounter (HOSPITAL_COMMUNITY): Payer: Self-pay | Admitting: General Practice

## 2011-12-25 ENCOUNTER — Observation Stay (HOSPITAL_COMMUNITY)
Admission: RE | Admit: 2011-12-25 | Discharge: 2011-12-26 | Disposition: A | Discharge status: Home / Self Care | Payer: Medicare Other | Source: Ambulatory Visit | Attending: Surgery | Admitting: Surgery

## 2011-12-25 ENCOUNTER — Encounter (HOSPITAL_COMMUNITY)
Admission: RE | Disposition: A | Discharge status: Home / Self Care | Payer: Self-pay | Source: Ambulatory Visit | Attending: Surgery

## 2011-12-25 ENCOUNTER — Other Ambulatory Visit: Payer: Self-pay | Admitting: *Deleted

## 2011-12-25 ENCOUNTER — Telehealth: Payer: Self-pay | Admitting: Vascular Surgery

## 2011-12-25 DIAGNOSIS — E119 Type 2 diabetes mellitus without complications: Secondary | ICD-10-CM | POA: Insufficient documentation

## 2011-12-25 DIAGNOSIS — I5022 Chronic systolic (congestive) heart failure: Secondary | ICD-10-CM | POA: Insufficient documentation

## 2011-12-25 DIAGNOSIS — I10 Essential (primary) hypertension: Secondary | ICD-10-CM | POA: Insufficient documentation

## 2011-12-25 DIAGNOSIS — I70409 Unspecified atherosclerosis of autologous vein bypass graft(s) of the extremities, unspecified extremity: Principal | ICD-10-CM | POA: Insufficient documentation

## 2011-12-25 DIAGNOSIS — E785 Hyperlipidemia, unspecified: Secondary | ICD-10-CM | POA: Insufficient documentation

## 2011-12-25 DIAGNOSIS — Z9861 Coronary angioplasty status: Secondary | ICD-10-CM | POA: Insufficient documentation

## 2011-12-25 DIAGNOSIS — Z48812 Encounter for surgical aftercare following surgery on the circulatory system: Secondary | ICD-10-CM

## 2011-12-25 DIAGNOSIS — I4891 Unspecified atrial fibrillation: Secondary | ICD-10-CM | POA: Insufficient documentation

## 2011-12-25 DIAGNOSIS — I509 Heart failure, unspecified: Secondary | ICD-10-CM | POA: Insufficient documentation

## 2011-12-25 DIAGNOSIS — I428 Other cardiomyopathies: Secondary | ICD-10-CM | POA: Insufficient documentation

## 2011-12-25 DIAGNOSIS — I739 Peripheral vascular disease, unspecified: Secondary | ICD-10-CM

## 2011-12-25 HISTORY — PX: LOWER EXTREMITY ANGIOGRAM: SHX5508

## 2011-12-25 HISTORY — PX: ABDOMINAL ANGIOGRAM: SHX5499

## 2011-12-25 HISTORY — DX: Unspecified osteoarthritis, unspecified site: M19.90

## 2011-12-25 LAB — POCT I-STAT, CHEM 8
Calcium, Ion: 1.17 mmol/L (ref 1.13–1.30)
Glucose, Bld: 123 mg/dL — ABNORMAL HIGH (ref 70–99)
HCT: 46 % (ref 39.0–52.0)
TCO2: 22 mmol/L (ref 0–100)

## 2011-12-25 LAB — CK TOTAL AND CKMB (NOT AT ARMC)
CK, MB: 4 ng/mL (ref 0.3–4.0)
Total CK: 101 U/L (ref 7–232)

## 2011-12-25 LAB — GLUCOSE, CAPILLARY
Glucose-Capillary: 115 mg/dL — ABNORMAL HIGH (ref 70–99)
Glucose-Capillary: 101 mg/dL — ABNORMAL HIGH (ref 70–99)

## 2011-12-25 LAB — TROPONIN I: Troponin I: <0.3 ng/mL (ref <0.30)

## 2011-12-25 SURGERY — ANGIOGRAM, LOWER EXTREMITY
Anesthesia: LOCAL | Laterality: Left

## 2011-12-25 MED ORDER — OXYCODONE-ACETAMINOPHEN 2.5-325 MG PO TABS: 1.0000 | ORAL_TABLET | ORAL | Status: DC | PRN

## 2011-12-25 MED ORDER — SODIUM CHLORIDE 0.9 % IV SOLN
1.0000 mL/kg/h | INTRAVENOUS | Status: DC
  Administered 2011-12-25: 1 mL/kg/h via INTRAVENOUS

## 2011-12-25 MED ORDER — FUROSEMIDE 40 MG PO TABS
40.0000 mg | ORAL_TABLET | Freq: Every day | ORAL | Status: DC
  Administered 2011-12-26: 40 mg via ORAL
  Filled 2011-12-25: qty 1

## 2011-12-25 MED ORDER — LABETALOL HCL 5 MG/ML IV SOLN
10.0000 mg | INTRAVENOUS | Status: DC | PRN
  Filled 2011-12-25: qty 4

## 2011-12-25 MED ORDER — PHENOL 1.4 % MT LIQD: 1.0000 | OROMUCOSAL | Status: DC | PRN

## 2011-12-25 MED ORDER — ACETAMINOPHEN 325 MG PO TABS: 325.0000 mg | ORAL_TABLET | ORAL | Status: DC | PRN

## 2011-12-25 MED ORDER — OXYCODONE HCL 5 MG PO TABS: 5.0000 mg | ORAL_TABLET | ORAL | Status: DC | PRN

## 2011-12-25 MED ORDER — CARVEDILOL 6.25 MG PO TABS
6.2500 mg | ORAL_TABLET | Freq: Two times a day (BID) | ORAL | Status: DC
  Administered 2011-12-25 – 2011-12-26 (×2): 6.25 mg via ORAL
  Filled 2011-12-25 (×4): qty 1

## 2011-12-25 MED ORDER — GUAIFENESIN-DM 100-10 MG/5ML PO SYRP: 15.0000 mL | ORAL_SOLUTION | ORAL | Status: DC | PRN

## 2011-12-25 MED ORDER — MIDAZOLAM HCL 2 MG/2ML IJ SOLN
INTRAMUSCULAR | Status: AC
  Filled 2011-12-25: qty 2

## 2011-12-25 MED ORDER — METOPROLOL TARTRATE 1 MG/ML IV SOLN: 2.0000 mg | INTRAVENOUS | Status: DC | PRN

## 2011-12-25 MED ORDER — LIDOCAINE HCL (PF) 1 % IJ SOLN
INTRAMUSCULAR | Status: AC
  Filled 2011-12-25: qty 30

## 2011-12-25 MED ORDER — METOPROLOL TARTRATE 1 MG/ML IV SOLN
5.0000 mg | Freq: Four times a day (QID) | INTRAVENOUS | Status: DC
  Administered 2011-12-25 (×2): 5 mg via INTRAVENOUS
  Filled 2011-12-25 (×7): qty 5

## 2011-12-25 MED ORDER — ONDANSETRON HCL 4 MG/2ML IJ SOLN: 4.0000 mg | Freq: Four times a day (QID) | INTRAMUSCULAR | Status: DC | PRN

## 2011-12-25 MED ORDER — NITROGLYCERIN 0.2 MG/ML ON CALL CATH LAB
INTRAVENOUS | Status: AC
  Filled 2011-12-25: qty 1

## 2011-12-25 MED ORDER — FENTANYL CITRATE 0.05 MG/ML IJ SOLN
INTRAMUSCULAR | Status: AC
  Filled 2011-12-25: qty 2

## 2011-12-25 MED ORDER — NITROGLYCERIN 0.4 MG SL SUBL: 0.4000 mg | SUBLINGUAL_TABLET | SUBLINGUAL | Status: DC | PRN

## 2011-12-25 MED ORDER — LOSARTAN POTASSIUM 50 MG PO TABS
100.0000 mg | ORAL_TABLET | Freq: Every day | ORAL | Status: DC
  Administered 2011-12-26: 100 mg via ORAL
  Filled 2011-12-25: qty 2

## 2011-12-25 MED ORDER — GABAPENTIN 300 MG PO CAPS
300.0000 mg | ORAL_CAPSULE | Freq: Three times a day (TID) | ORAL | Status: DC
  Administered 2011-12-26: 300 mg via ORAL
  Filled 2011-12-25 (×5): qty 1

## 2011-12-25 MED ORDER — ALUM & MAG HYDROXIDE-SIMETH 200-200-20 MG/5ML PO SUSP
15.0000 mL | ORAL | Status: DC | PRN
  Administered 2011-12-25: 30 mL via ORAL
  Filled 2011-12-25: qty 30

## 2011-12-25 MED ORDER — FINASTERIDE 5 MG PO TABS
5.0000 mg | ORAL_TABLET | Freq: Every day | ORAL | Status: DC
  Administered 2011-12-26: 5 mg via ORAL
  Filled 2011-12-25: qty 1

## 2011-12-25 MED ORDER — MORPHINE SULFATE 10 MG/ML IJ SOLN: 2.0000 mg | INTRAMUSCULAR | Status: DC | PRN

## 2011-12-25 MED ORDER — HYDRALAZINE HCL 20 MG/ML IJ SOLN
10.0000 mg | INTRAMUSCULAR | Status: DC | PRN
  Filled 2011-12-25: qty 0.5

## 2011-12-25 MED ORDER — OXYCODONE HCL 5 MG PO TABS: 2.5000 mg | ORAL_TABLET | ORAL | Status: DC | PRN

## 2011-12-25 MED ORDER — ACETAMINOPHEN 325 MG RE SUPP
325.0000 mg | RECTAL | Status: DC | PRN
  Filled 2011-12-25: qty 2

## 2011-12-25 MED ORDER — INSULIN ASPART 100 UNIT/ML ~~LOC~~ SOLN
0.0000 [IU] | Freq: Three times a day (TID) | SUBCUTANEOUS | Status: DC
  Administered 2011-12-25: 3 [IU] via SUBCUTANEOUS

## 2011-12-25 MED ORDER — ASPIRIN EC 81 MG PO TBEC
81.0000 mg | DELAYED_RELEASE_TABLET | Freq: Every day | ORAL | Status: DC
  Administered 2011-12-26: 81 mg via ORAL
  Filled 2011-12-25: qty 1

## 2011-12-25 MED ORDER — PHENOL 1.4 % MT LIQD
1.0000 | OROMUCOSAL | Status: DC | PRN
  Filled 2011-12-25: qty 177

## 2011-12-25 MED ORDER — HEPARIN (PORCINE) IN NACL 2-0.9 UNIT/ML-% IJ SOLN
INTRAMUSCULAR | Status: AC
  Filled 2011-12-25: qty 1000

## 2011-12-25 NOTE — Progress Notes (Signed)
Pt c/o chest pain to left "like a tooth ache"  12 lead ekg obtained 2-L O2 [place] placed  Dr Myra Gianotti notified, cardiac enzymes,  NS @ 50, admit 2000 will continue to follow

## 2011-12-25 NOTE — Progress Notes (Signed)
Utilization Review Completed.[facility] T10/23/2013   

## 2011-12-25 NOTE — Op Note (Signed)
[facility] [place]  Patient name: [name] MRN: [id number] DOB: 01-Dec-1934 Sex: male  12/25/2011 Pre-operative Diagnosis: Rule out left femoral anterior tibial artery bypass graft stenosis Post-operative diagnosis:  Same Surgeon:  Jorge Ny Procedure Performed:  1.  ultrasound access right common femoral artery  2.  abdominal aortogram with CO2  3.  third order catheter placement  4.  left lower extremity runoff     Indications:  The patient has undergone left femoral to anterior tibial artery bypass graft with vein. His recent duplex ultrasound revealed elevated velocities just proximal to the distal anastomosis. He comes in today to have this further evaluated and possibly treated. His creatinine today was 2.2 which is slightly above his baseline. He was given hydration.  Procedure:  The patient was identified in the holding area and taken to room 8.  The patient was then placed supine on the table and prepped and draped in the usual sterile fashion.  A time out was called.  Ultrasound was used to evaluate the right common femoral artery.  It was patent .  A digital ultrasound image was acquired.  A micropuncture needle was used to access the right common femoral artery under ultrasound guidance.  An 018 wire was advanced without resistance and a micropuncture sheath was placed.  The 018 wire was removed and a benson wire was placed.  The micropuncture sheath was exchanged for a 5 french sheath.  An omniflush catheter was advanced over the wire to the level of L-1.  An abdominal angiogram was obtained.  Next, using the omniflush catheter and a benson wire, the aortic bifurcation was crossed and the catheter was placed into theleft external iliac artery and left runoff was obtained.    Findings:   Aortogram:  The visualized portions of the suprarenal abdominal aorta showed no significant disease. There is no evidence of renal artery stenosis. The  infrarenal abdominal aorta is widely patent. The left common and external iliac arteries are widely patent. There is luminal irregularity within the distal right common iliac and proximal right external iliac artery. However, there is no pressure gradient across this area.  Right Lower Extremity:  The left common femoral and profunda femoral arteries are widely patent. The superficial femoral artery is occluded. There is a bypass graft originating from the distal left common femoral artery. The distal anastomosis is to the proximal anterior tibial artery. There is no evidence of stenosis within the proximal or distal anastomosis. Bypass graft is widely patent. I do not see any evidence of a stenosis that was identified on duplex ultrasound. The anterior tibial artery is the dominant runoff.    Intervention:  None  Impression:  #1  no significant aortoiliac occlusive disease. However there is a luminal irregularity in the distal right common iliac and proximal right external iliac artery.  There was not a significant pressure gradient across this lesion.  #2  the left femoral to anterior tibial artery bypass graft is widely patent. No evidence of stenosis is seen at the proximal or distal anastomosis. The area of concern on ultrasound does not appear to be hemodynamically significant.     Juleen China, M.D. [facility] [place] Office: [phone] Pager:  [phone]

## 2011-12-25 NOTE — Interval H&P Note (Signed)
History and Physical Interval Note:  12/25/2011 8:31 AM  [name]  has presented today for surgery, with the diagnosis of vein graft stenosis  The various methods of treatment have been discussed with the patient and family. After consideration of risks, benefits and other options for treatment, the patient has consented to  Procedure(s) (LRB) with comments: LOWER EXTREMITY ANGIOGRAM (N/A) as a surgical intervention .  The patient's history has been reviewed, patient examined, no change in status, stable for surgery.  I have reviewed the patient's chart and labs.  Questions were answered to the patient's satisfaction.     BRABHAM IV, V. WELLS

## 2011-12-25 NOTE — H&P (View-Only) (Signed)
Subjective:     Patient ID: [name], male   DOB: 11/15/1934, 77 y.o.   MRN: [id number]  HPI this 77 year old male had left femoral to proximal anterior tibial bypass graft performed by me July 24 for ischemic left foot and severe claudication. Graft has been functioning nicely. He does have chronic burning and numbness in the lateral aspect of the left foot and lower leg due to ischemic neuropathy but it is improving with GABApentin. He denies severe claudication symptoms at this time  Review of Systems     Objective:   Physical ExamBP 100/69  Pulse 77  Ht 5\' 8"  (1.727 m)  Wt 184 lb (83.462 kg)  BMI 27.98 kg/m2  SpO2 100%  General well-developed well-nourished male no apparent stress alert and oriented x3 Left lower extremity with 3+ femoral and subcutaneous graft pulse and 2+ dorsalis pedis pulse palpable. 3+ femoral pulse on the right is palpable. Left foot is well-perfused.   Today I ordered a venous duplex scan of the graft which are reviewed and interpreted. Graft is widely patent with a normal ABI 1.0. There is a stenosis in the very distal aspect of the vein graft with velocity of 416 cm/s which appears quite focal and may represent a valve cusp     Assessment:     Patent left femoral anterior tibial bypass graft placed July 23 pain with possible severe distal vein graft stenosis    Plan:     Plan angiography of left leg via a right common femoral approach by Dr. Myra Gianotti on October 22 with possible PTA of distal vein graft stenosis if appropriate

## 2011-12-25 NOTE — Telephone Encounter (Addendum)
Message copied by Shari Prows on Wed Dec 25, 2011 11:46 AM ------      Message from: Melene Plan      Created: Wed Dec 25, 2011  9:36 AM                   ----- Message -----         From: Nada Libman, MD         Sent: 12/25/2011   9:35 AM           To: Reuel Derby, Melene Plan, RN            12/25/2011:                   1.  ultrasound access right common femoral artery       2.  abdominal aortogram with CO2       3.  third order catheter placement       4.  left lower extremity runoff                  Please schedule the patient to come back to see Dr. Hart Rochester in 3 months with a duplex ultrasound of his bypass  I scheduled an appt for the pt on 03/24/12 at 9:30am. I mailed an appt letter and also left a message for the pt. awt

## 2011-12-26 DIAGNOSIS — R079 Chest pain, unspecified: Secondary | ICD-10-CM

## 2011-12-26 LAB — HEMOGLOBIN A1C
Hgb A1c MFr Bld: 6.4 % — ABNORMAL HIGH (ref <5.7)
Mean Plasma Glucose: 137 mg/dL — ABNORMAL HIGH (ref <117)

## 2011-12-26 NOTE — Progress Notes (Signed)
[facility] [place]  Progress Note Bypass Surgery  Date of Surgery: 12/25/2011  Procedure(s): Left LOWER EXTREMITY ANGIOGRAM ABDOMINAL ANGIOGRAM Surgeon: Surgeon(s): Nada Libman, MD  1 Day Post-Op  History of Present Illness  [name] is a 77 y.o. male who is S/P Procedure(s): LLE LOWER EXTREMITY ANGIOGRAM ABDOMINAL ANGIOGRAM  The patient's is doing well. No further chest pain. Cardiac enzymes WNL. Trop <0.30  Significant Diagnostic Studies: CBC Lab Results  Component Value Date   WBC 7.3 09/26/2011   HGB 15.6 12/25/2011   HCT 46.0 12/25/2011   MCV 86.5 09/26/2011   PLT 140* 09/26/2011    BMET     Component Value Date/Time   NA 140 12/25/2011 0659   K 4.4 12/25/2011 0659   CL 107 12/25/2011 0659   CO2 23 09/26/2011 0403   GLUCOSE 123* 12/25/2011 0659   BUN 41* 12/25/2011 0659   CREATININE 2.20* 12/25/2011 0659   CALCIUM 8.1* 09/26/2011 0403   GFRNONAA 31* 09/26/2011 0403   GFRAA 36* 09/26/2011 0403    COAG Lab Results  Component Value Date   INR 1.07 09/11/2011   INR 1.10 12/25/2009   INR 2.1 11/01/2009   No results found for this basename: PTT    Physical Examination  BP Readings from Last 3 Encounters:  12/26/11 124/80  12/26/11 124/80  12/17/11 100/69   Temp Readings from Last 3 Encounters:  12/26/11 97.9 F (36.6 C) Oral  12/26/11 97.9 F (36.6 C) Oral  12/09/11 97.7 F (36.5 C) Oral   SpO2 Readings from Last 3 Encounters:  12/26/11 99%  12/26/11 99%  12/17/11 100%   Pulse Readings from Last 3 Encounters:  12/26/11 70  12/26/11 70  12/17/11 77    Pt is A&O x 3 right lower extremity: Incision/s is/are clean,dry.intact, and  healing without hematoma, erythema or drainage Limb is warm; with good color  Assessment/Plan: Pt. Doing well with no further CP and neg card enzymes Home today F/U with Dr. Hart Rochester as scheduled  [name] [phone] 12/26/2011 7:54 AM

## 2011-12-26 NOTE — Discharge Summary (Signed)
[facility] Discharge Summary   Patient ID:  [name] MRN: [id number] DOB/AGE: 1935-01-30 77 y.o.  Admit date: 12/25/2011 Discharge date: 12/26/2011 Date of Surgery: 12/25/2011 Surgeon: Surgeon(s): Nada Libman, MD  Admission Diagnosis: vein graft stenosis  Discharge Diagnoses:  vein graft stenosis  Secondary Diagnoses: Past Medical History  Diagnosis Date  . Hyperlipidemia   . Hypertension   . Gout   . Benign prostatic hypertrophy   . Atrial fibrillation     sees Dr. Verdis Prime  . Chronic systolic dysfunction of left ventricle     a. mixed ischemic and nonischemic CM,  EF 35%. b. s/p AICD implantation.  Marland Kitchen CAD (coronary artery disease)     a. s/p mid LAD stenting with DES 2008  . CHF (congestive heart failure)   . PAD (peripheral artery disease)     Severe by PV angiogram 09/2011  . ED (erectile dysfunction)   . Renal artery stenosis     s/p stenting 2009  . Diabetes mellitus     sees Dr. Everardo All   . Arthritis     Procedure(s): LOWER EXTREMITY ANGIOGRAM ABDOMINAL ANGIOGRAM  Discharged Condition: good  HPI:  77 year old male had left femoral to proximal anterior tibial bypass graft performed by me July 24 for ischemic left foot and severe claudication. Graft has been functioning nicely. He does have chronic burning and numbness in the lateral aspect of the left foot and lower leg due to ischemic neuropathy but it is improving with GABApentin. He denies severe claudication symptoms at this time. Pt had Angiogram and developed mild Chest pain post - procedure. EKG no changes  Hospital Course:  [name] is a 77 y.o. male is S/P Left Procedure(s): LOWER EXTREMITY ANGIOGRAM ABDOMINAL ANGIOGRAM Post-op wounds healing well Pt. Ambulating, voiding and taking PO diet without difficulty. Pt pain controlled with PO pain meds. Labs as below Complications:post-procedure CP. No recurrence. Cardiac enzymes negative  Consults:      Significant Diagnostic Studies: CBC Lab Results  Component Value Date   WBC 7.3 09/26/2011   HGB 15.6 12/25/2011   HCT 46.0 12/25/2011   MCV 86.5 09/26/2011   PLT 140* 09/26/2011    BMET    Component Value Date/Time   NA 140 12/25/2011 0659   K 4.4 12/25/2011 0659   CL 107 12/25/2011 0659   CO2 23 09/26/2011 0403   GLUCOSE 123* 12/25/2011 0659   BUN 41* 12/25/2011 0659   CREATININE 2.20* 12/25/2011 0659   CALCIUM 8.1* 09/26/2011 0403   GFRNONAA 31* 09/26/2011 0403   GFRAA 36* 09/26/2011 0403   COAG Lab Results  Component Value Date   INR 1.07 09/11/2011   INR 1.10 12/25/2009   INR 2.1 11/01/2009     Disposition:  Discharge to :Home Discharge Orders    Future Appointments: Provider: Department: Dept Phone: Center:   01/06/2012 12:05 PM [facility] [phone] [facility]   03/24/2012 9:30 AM [facility] [phone] [facility]   03/24/2012 10:00 AM [facility] [phone] [facility]   03/24/2012 10:30 AM Pryor Ochoa, MD [facility] [phone] [facility]      Cathlyn Parsons  Home Medication Instructions [id number]   Printed on:12/26/11 [phone]  Medication Information                    insulin aspart protamine-insulin aspart (NOVOLOG 70/30) (70-30) 100 UNIT/ML injection Inject 6-10 Units into the skin 2 (two) times daily with a meal.  10 units with breakfast, and 6 units with the evening meal           vitamin E 1000 UNIT capsule Take 1,000 Units by mouth daily.             Pyridoxine HCl (VITAMIN B-6 PO) Take 1 tablet by mouth daily.            finasteride (PROSCAR) 5 MG tablet Take 5 mg by mouth daily.           aspirin EC 81 MG tablet Take 81 mg by mouth daily.           carvedilol (COREG) 6.25 MG tablet Take 6.25 mg by mouth 2 (two) times daily with a meal.           furosemide (LASIX) 40 MG tablet Take 40 mg by mouth daily.           gabapentin (NEURONTIN) 300 MG capsule Take 300 mg by  mouth 3 (three) times daily.           traMADol (ULTRAM) 50 MG tablet Take 50 mg by mouth at bedtime as needed. For pain.           losartan (COZAAR) 100 MG tablet Take 100 mg by mouth daily.           oxycodone-acetaminophen (PERCOCET) 2.5-325 MG per tablet Take 1 tablet by mouth every 4 (four) hours as needed. For pain            Verbal and written Discharge instructions given to the patient. Wound care per Discharge AVS  Pt has F/U with Dr. Hart Rochester  Signed: [name] 12/26/2011, 7:36 AM

## 2011-12-26 NOTE — Discharge Summary (Signed)
Kept in hospital for irregular feeling in his chest.  Cardiac work-up was negative.  He is back to his baseline, and is stable for discharge.  Durene Cal

## 2012-01-06 ENCOUNTER — Ambulatory Visit (INDEPENDENT_AMBULATORY_CARE_PROVIDER_SITE_OTHER): Payer: Medicare Other | Admitting: *Deleted

## 2012-01-06 ENCOUNTER — Encounter: Payer: Self-pay | Admitting: Internal Medicine

## 2012-01-06 DIAGNOSIS — I5022 Chronic systolic (congestive) heart failure: Secondary | ICD-10-CM

## 2012-01-06 DIAGNOSIS — I428 Other cardiomyopathies: Secondary | ICD-10-CM

## 2012-01-06 DIAGNOSIS — I509 Heart failure, unspecified: Secondary | ICD-10-CM

## 2012-01-08 NOTE — Progress Notes (Signed)
[name] was not seen in the office by Dr. Darrick Penna on 9/19 (per appointment notes).  Orders were entered for future labs

## 2012-01-14 LAB — REMOTE ICD DEVICE
AL AMPLITUDE: 0.1 mv
AL IMPEDENCE ICD: 323 Ohm
ATRIAL PACING ICD: 0 pct
BATTERY VOLTAGE: 3.006 V
CHARGE TIME: 10.129 s
FVT: 0
LV LEAD IMPEDENCE ICD: 836 Ohm
LV LEAD THRESHOLD: 1.25 V
PACEART VT: 0
RV LEAD AMPLITUDE: 15.4 mv
RV LEAD IMPEDENCE ICD: 494 Ohm
RV LEAD THRESHOLD: 0.875 V
TOT-0001: 1
TOT-0002: 0
TOT-0006: 20111024000000
TZAT-0001ATACH: 1
TZAT-0001ATACH: 2
TZAT-0001ATACH: 3
TZAT-0001FASTVT: 1
TZAT-0001SLOWVT: 1
TZAT-0001SLOWVT: 2
TZAT-0002ATACH: NEGATIVE
TZAT-0002ATACH: NEGATIVE
TZAT-0002ATACH: NEGATIVE
TZAT-0002FASTVT: NEGATIVE
TZAT-0004SLOWVT: 8
TZAT-0004SLOWVT: 8
TZAT-0005SLOWVT: 88 pct
TZAT-0005SLOWVT: 91 pct
TZAT-0011SLOWVT: 10 ms
TZAT-0011SLOWVT: 10 ms
TZAT-0012ATACH: 150 ms
TZAT-0012ATACH: 150 ms
TZAT-0012ATACH: 150 ms
TZAT-0012FASTVT: 200 ms
TZAT-0012SLOWVT: 200 ms
TZAT-0012SLOWVT: 200 ms
TZAT-0013SLOWVT: 2
TZAT-0013SLOWVT: 2
TZAT-0018ATACH: NEGATIVE
TZAT-0018ATACH: NEGATIVE
TZAT-0018ATACH: NEGATIVE
TZAT-0018FASTVT: NEGATIVE
TZAT-0018SLOWVT: NEGATIVE
TZAT-0018SLOWVT: NEGATIVE
TZAT-0019ATACH: 6 V
TZAT-0019ATACH: 6 V
TZAT-0019ATACH: 6 V
TZAT-0019FASTVT: 8 V
TZAT-0019SLOWVT: 8 V
TZAT-0019SLOWVT: 8 V
TZAT-0020ATACH: 1.5 ms
TZAT-0020ATACH: 1.5 ms
TZAT-0020ATACH: 1.5 ms
TZAT-0020FASTVT: 1.5 ms
TZAT-0020SLOWVT: 1.5 ms
TZAT-0020SLOWVT: 1.5 ms
TZON-0003ATACH: 350 ms
TZON-0003SLOWVT: 320 ms
TZON-0003VSLOWVT: 450 ms
TZON-0004SLOWVT: 28
TZON-0004VSLOWVT: 20
TZON-0005SLOWVT: 12
TZST-0001ATACH: 4
TZST-0001ATACH: 5
TZST-0001ATACH: 6
TZST-0001FASTVT: 2
TZST-0001FASTVT: 3
TZST-0001FASTVT: 4
TZST-0001FASTVT: 5
TZST-0001FASTVT: 6
TZST-0001SLOWVT: 3
TZST-0001SLOWVT: 4
TZST-0001SLOWVT: 5
TZST-0001SLOWVT: 6
TZST-0002ATACH: NEGATIVE
TZST-0002ATACH: NEGATIVE
TZST-0002ATACH: NEGATIVE
TZST-0002FASTVT: NEGATIVE
TZST-0002FASTVT: NEGATIVE
TZST-0002FASTVT: NEGATIVE
TZST-0002FASTVT: NEGATIVE
TZST-0002FASTVT: NEGATIVE
TZST-0003SLOWVT: 30 J
TZST-0003SLOWVT: 35 J
TZST-0003SLOWVT: 35 J
TZST-0003SLOWVT: 35 J
VENTRICULAR PACING ICD: 93.65 pct
VF: 0

## 2012-01-15 ENCOUNTER — Encounter: Payer: Self-pay | Admitting: Family Medicine

## 2012-01-15 ENCOUNTER — Ambulatory Visit (INDEPENDENT_AMBULATORY_CARE_PROVIDER_SITE_OTHER): Payer: PRIVATE HEALTH INSURANCE | Admitting: Family Medicine

## 2012-01-15 VITALS — BP 120/80 | HR 84 | Temp 98.2°F | Wt 187.0 lb

## 2012-01-15 DIAGNOSIS — L02419 Cutaneous abscess of limb, unspecified: Secondary | ICD-10-CM

## 2012-01-15 DIAGNOSIS — L03119 Cellulitis of unspecified part of limb: Secondary | ICD-10-CM

## 2012-01-15 MED ORDER — CEPHALEXIN 500 MG PO CAPS: 500.0000 mg | ORAL_CAPSULE | Freq: Three times a day (TID) | ORAL | Status: DC

## 2012-01-15 NOTE — Progress Notes (Signed)
  Subjective:    Patient ID: [name], male    DOB: 14-Feb-1935, 77 y.o.   MRN: [id number]  HPI Here for redness and mild pain in the left lower leg. This happened after he scraped the leg on the corner of an exam table in a doctor' s office 2 weeks ago. No fever. He is trying Lyrica instead on Gabapentin for the neuropathy in this leg.    Review of Systems  Constitutional: Negative.   Skin: Positive for wound.       Objective:   Physical Exam  Constitutional: He appears well-developed and well-nourished.  Musculoskeletal:       Several scabbed superficial wounds on the left lower leg surrounded by erythema, warmth, and tenderness. Not swollen          Assessment & Plan:  Cellulitis after an abrasion. Given 10 days of Keflex

## 2012-01-23 ENCOUNTER — Other Ambulatory Visit: Payer: Self-pay | Admitting: *Deleted

## 2012-01-23 ENCOUNTER — Other Ambulatory Visit: Payer: Self-pay | Admitting: Internal Medicine

## 2012-01-23 ENCOUNTER — Telehealth: Payer: Self-pay | Admitting: *Deleted

## 2012-01-23 MED ORDER — CARVEDILOL 6.25 MG PO TABS: 6.2500 mg | ORAL_TABLET | Freq: Two times a day (BID) | ORAL | Status: DC

## 2012-01-23 MED ORDER — ASPIRIN EC 81 MG PO TBEC: 81.0000 mg | DELAYED_RELEASE_TABLET | Freq: Every day | ORAL | Status: DC

## 2012-01-23 NOTE — Telephone Encounter (Signed)
Spoke with pharmacist , sent aspirin 81mg  by accidents, it was rather a correction for carvedilol 12.5mg  not 6.25mg . Thanks [place]

## 2012-01-28 ENCOUNTER — Encounter: Payer: Self-pay | Admitting: *Deleted

## 2012-02-06 ENCOUNTER — Telehealth: Payer: Self-pay

## 2012-02-06 DIAGNOSIS — I739 Peripheral vascular disease, unspecified: Secondary | ICD-10-CM

## 2012-02-06 DIAGNOSIS — IMO0001 Reserved for inherently not codable concepts without codable children: Secondary | ICD-10-CM

## 2012-02-06 NOTE — Telephone Encounter (Signed)
Pt. Called to report noticeable increased discomfort in left foot over past 2 weeks, and an intermittent stabbing pain in left lower leg that started yesterday.  Denies swelling of left foot/leg.  States has numbness in left foot, but states due to  neuropathy, this is not a change.  Describes color of left lower leg as "tan", and "a little darker than it has been."  Discussed w/ Dr. Darrick Penna.   Rec'd v.o. To schedule left LE art.duplex and ABI's with office visit tomorrow.  Pt. Made aware that the office will call him in the morning with an appt.  Agrees w/ plan.

## 2012-02-07 ENCOUNTER — Encounter: Payer: Self-pay | Admitting: Neurosurgery

## 2012-02-07 ENCOUNTER — Inpatient Hospital Stay (HOSPITAL_COMMUNITY)
Admission: AD | Admit: 2012-02-07 | Discharge: 2012-02-08 | DRG: 254 | Disposition: A | Discharge status: Home / Self Care | Payer: Medicare Other | Source: Ambulatory Visit | Attending: Vascular Surgery | Admitting: Vascular Surgery

## 2012-02-07 ENCOUNTER — Encounter (HOSPITAL_COMMUNITY): Payer: Self-pay | Admitting: Pharmacy Technician

## 2012-02-07 ENCOUNTER — Encounter (HOSPITAL_COMMUNITY): Payer: Self-pay | Admitting: Anesthesiology

## 2012-02-07 ENCOUNTER — Encounter (INDEPENDENT_AMBULATORY_CARE_PROVIDER_SITE_OTHER): Payer: Medicare Other | Admitting: *Deleted

## 2012-02-07 ENCOUNTER — Other Ambulatory Visit: Payer: Self-pay

## 2012-02-07 ENCOUNTER — Encounter (HOSPITAL_COMMUNITY): Payer: Self-pay | Admitting: *Deleted

## 2012-02-07 ENCOUNTER — Encounter (HOSPITAL_COMMUNITY)
Admission: AD | Disposition: A | Discharge status: Home / Self Care | Payer: Self-pay | Source: Ambulatory Visit | Attending: Vascular Surgery

## 2012-02-07 ENCOUNTER — Encounter (HOSPITAL_COMMUNITY): Payer: Self-pay

## 2012-02-07 ENCOUNTER — Ambulatory Visit (INDEPENDENT_AMBULATORY_CARE_PROVIDER_SITE_OTHER): Payer: Medicare Other | Admitting: Neurosurgery

## 2012-02-07 ENCOUNTER — Ambulatory Visit (HOSPITAL_COMMUNITY): Payer: Medicare Other | Admitting: Anesthesiology

## 2012-02-07 VITALS — BP 184/97 | HR 71 | Resp 18 | Ht 68.0 in | Wt 169.0 lb

## 2012-02-07 DIAGNOSIS — IMO0001 Reserved for inherently not codable concepts without codable children: Secondary | ICD-10-CM

## 2012-02-07 DIAGNOSIS — E785 Hyperlipidemia, unspecified: Secondary | ICD-10-CM | POA: Diagnosis present

## 2012-02-07 DIAGNOSIS — M79609 Pain in unspecified limb: Secondary | ICD-10-CM

## 2012-02-07 DIAGNOSIS — I2510 Atherosclerotic heart disease of native coronary artery without angina pectoris: Secondary | ICD-10-CM | POA: Diagnosis present

## 2012-02-07 DIAGNOSIS — Y849 Medical procedure, unspecified as the cause of abnormal reaction of the patient, or of later complication, without mention of misadventure at the time of the procedure: Secondary | ICD-10-CM | POA: Diagnosis present

## 2012-02-07 DIAGNOSIS — Z7982 Long term (current) use of aspirin: Secondary | ICD-10-CM

## 2012-02-07 DIAGNOSIS — I509 Heart failure, unspecified: Secondary | ICD-10-CM | POA: Diagnosis present

## 2012-02-07 DIAGNOSIS — I739 Peripheral vascular disease, unspecified: Secondary | ICD-10-CM

## 2012-02-07 DIAGNOSIS — T82898A Other specified complication of vascular prosthetic devices, implants and grafts, initial encounter: Secondary | ICD-10-CM

## 2012-02-07 DIAGNOSIS — E119 Type 2 diabetes mellitus without complications: Secondary | ICD-10-CM | POA: Diagnosis present

## 2012-02-07 DIAGNOSIS — Z794 Long term (current) use of insulin: Secondary | ICD-10-CM

## 2012-02-07 DIAGNOSIS — I10 Essential (primary) hypertension: Secondary | ICD-10-CM | POA: Diagnosis present

## 2012-02-07 DIAGNOSIS — Z950 Presence of cardiac pacemaker: Secondary | ICD-10-CM

## 2012-02-07 DIAGNOSIS — F17200 Nicotine dependence, unspecified, uncomplicated: Secondary | ICD-10-CM | POA: Diagnosis present

## 2012-02-07 DIAGNOSIS — N400 Enlarged prostate without lower urinary tract symptoms: Secondary | ICD-10-CM | POA: Diagnosis present

## 2012-02-07 HISTORY — PX: FEMORAL-TIBIAL BYPASS GRAFT: SHX938

## 2012-02-07 HISTORY — PX: EMBOLECTOMY: SHX44

## 2012-02-07 LAB — POCT I-STAT, CHEM 8
BUN: 58 mg/dL — ABNORMAL HIGH (ref 6–23)
Calcium, Ion: 1.11 mmol/L — ABNORMAL LOW (ref 1.13–1.30)
Glucose, Bld: 76 mg/dL (ref 70–99)
TCO2: 22 mmol/L (ref 0–100)

## 2012-02-07 LAB — GLUCOSE, CAPILLARY: Glucose-Capillary: 91 mg/dL (ref 70–99)

## 2012-02-07 LAB — PROTIME-INR
INR: 1.03 (ref 0.00–1.49)
Prothrombin Time: 13.4 seconds (ref 11.6–15.2)

## 2012-02-07 LAB — MRSA PCR SCREENING: MRSA by PCR: NEGATIVE

## 2012-02-07 SURGERY — CREATION, BYPASS, ARTERIAL, FEMORAL TO TIBIAL, USING GRAFT
Anesthesia: General | Site: Leg Lower | Laterality: Left | Wound class: Clean

## 2012-02-07 SURGERY — EMBOLECTOMY
Anesthesia: General | Laterality: Left

## 2012-02-07 MED ORDER — LABETALOL HCL 5 MG/ML IV SOLN
10.0000 mg | INTRAVENOUS | Status: DC | PRN
  Administered 2012-02-07: 10 mg via INTRAVENOUS
  Filled 2012-02-07: qty 4

## 2012-02-07 MED ORDER — BISACODYL 10 MG RE SUPP: 10.0000 mg | Freq: Every day | RECTAL | Status: DC | PRN

## 2012-02-07 MED ORDER — CARVEDILOL 6.25 MG PO TABS
6.2500 mg | ORAL_TABLET | Freq: Two times a day (BID) | ORAL | Status: DC
  Administered 2012-02-08: 6.25 mg via ORAL
  Filled 2012-02-07 (×4): qty 1

## 2012-02-07 MED ORDER — LACTATED RINGERS IV SOLN
INTRAVENOUS | Status: DC | PRN
  Administered 2012-02-07 (×2): via INTRAVENOUS

## 2012-02-07 MED ORDER — PROTAMINE SULFATE 10 MG/ML IV SOLN
INTRAVENOUS | Status: DC | PRN
  Administered 2012-02-07 (×5): 10 mg via INTRAVENOUS

## 2012-02-07 MED ORDER — DOCUSATE SODIUM 100 MG PO CAPS
100.0000 mg | ORAL_CAPSULE | Freq: Every day | ORAL | Status: DC
  Filled 2012-02-07: qty 1

## 2012-02-07 MED ORDER — DEXTROSE 5 % IV SOLN
1.5000 g | INTRAVENOUS | Status: AC
  Administered 2012-02-07: 1.5 g via INTRAVENOUS
  Filled 2012-02-07: qty 1.5

## 2012-02-07 MED ORDER — PROMETHAZINE HCL 25 MG/ML IJ SOLN: 6.2500 mg | INTRAMUSCULAR | Status: DC | PRN

## 2012-02-07 MED ORDER — ONDANSETRON HCL 4 MG/2ML IJ SOLN
INTRAMUSCULAR | Status: DC | PRN
  Administered 2012-02-07: 4 mg via INTRAVENOUS

## 2012-02-07 MED ORDER — METOPROLOL TARTRATE 1 MG/ML IV SOLN: 2.0000 mg | INTRAVENOUS | Status: DC | PRN

## 2012-02-07 MED ORDER — ACETAMINOPHEN 325 MG PO TABS
325.0000 mg | ORAL_TABLET | ORAL | Status: DC | PRN
Start: 2012-02-07 — End: 2012-02-08

## 2012-02-07 MED ORDER — ACETAMINOPHEN 650 MG RE SUPP: 325.0000 mg | RECTAL | Status: DC | PRN

## 2012-02-07 MED ORDER — INSULIN ASPART PROT & ASPART (70-30 MIX) 100 UNIT/ML ~~LOC~~ SUSP
6.0000 [IU] | Freq: Two times a day (BID) | SUBCUTANEOUS | Status: DC
  Administered 2012-02-08: 10 [IU] via SUBCUTANEOUS
  Filled 2012-02-07 (×2): qty 3

## 2012-02-07 MED ORDER — ENOXAPARIN SODIUM 40 MG/0.4ML ~~LOC~~ SOLN
40.0000 mg | SUBCUTANEOUS | Status: DC
  Filled 2012-02-07: qty 0.4

## 2012-02-07 MED ORDER — ONDANSETRON HCL 4 MG/2ML IJ SOLN
4.0000 mg | Freq: Four times a day (QID) | INTRAMUSCULAR | Status: DC | PRN
  Administered 2012-02-07: 4 mg via INTRAVENOUS
  Filled 2012-02-07: qty 2

## 2012-02-07 MED ORDER — FUROSEMIDE 40 MG PO TABS
40.0000 mg | ORAL_TABLET | Freq: Every day | ORAL | Status: DC
  Administered 2012-02-08: 40 mg via ORAL
  Filled 2012-02-07: qty 1

## 2012-02-07 MED ORDER — GLYCOPYRROLATE 0.2 MG/ML IJ SOLN
INTRAMUSCULAR | Status: DC | PRN
  Administered 2012-02-07: .4 mg via INTRAVENOUS

## 2012-02-07 MED ORDER — 0.9 % SODIUM CHLORIDE (POUR BTL) OPTIME
TOPICAL | Status: DC | PRN
  Administered 2012-02-07: 3000 mL

## 2012-02-07 MED ORDER — HYDROMORPHONE HCL PF 1 MG/ML IJ SOLN
0.2500 mg | INTRAMUSCULAR | Status: DC | PRN
  Administered 2012-02-07 (×2): 0.5 mg via INTRAVENOUS

## 2012-02-07 MED ORDER — OXYCODONE HCL 5 MG/5ML PO SOLN: 5.0000 mg | Freq: Once | ORAL | Status: DC | PRN

## 2012-02-07 MED ORDER — INSULIN ASPART 100 UNIT/ML ~~LOC~~ SOLN: 0.0000 [IU] | Freq: Every day | SUBCUTANEOUS | Status: DC

## 2012-02-07 MED ORDER — ASPIRIN EC 81 MG PO TBEC
81.0000 mg | DELAYED_RELEASE_TABLET | Freq: Every day | ORAL | Status: DC
  Administered 2012-02-08: 81 mg via ORAL
  Filled 2012-02-07: qty 1

## 2012-02-07 MED ORDER — DEXTROSE 5 % IV SOLN
1.5000 g | Freq: Two times a day (BID) | INTRAVENOUS | Status: AC
  Administered 2012-02-07 – 2012-02-08 (×2): 1.5 g via INTRAVENOUS
  Filled 2012-02-07 (×2): qty 1.5

## 2012-02-07 MED ORDER — HEPARIN SODIUM (PORCINE) 1000 UNIT/ML IJ SOLN
INTRAMUSCULAR | Status: DC | PRN
  Administered 2012-02-07: 8000 [IU] via INTRAVENOUS

## 2012-02-07 MED ORDER — MORPHINE SULFATE 2 MG/ML IJ SOLN
2.0000 mg | INTRAMUSCULAR | Status: DC | PRN
Start: 2012-02-07 — End: 2012-02-08

## 2012-02-07 MED ORDER — OXYCODONE HCL 5 MG PO TABS: 5.0000 mg | ORAL_TABLET | Freq: Once | ORAL | Status: DC | PRN

## 2012-02-07 MED ORDER — FINASTERIDE 5 MG PO TABS
5.0000 mg | ORAL_TABLET | Freq: Every day | ORAL | Status: DC
  Administered 2012-02-08: 5 mg via ORAL
  Filled 2012-02-07: qty 1

## 2012-02-07 MED ORDER — SODIUM CHLORIDE 0.9 % IV SOLN
INTRAVENOUS | Status: DC
  Administered 2012-02-07: 22:00:00 via INTRAVENOUS

## 2012-02-07 MED ORDER — SUCCINYLCHOLINE CHLORIDE 20 MG/ML IJ SOLN
INTRAMUSCULAR | Status: DC | PRN
  Administered 2012-02-07: 100 mg via INTRAVENOUS

## 2012-02-07 MED ORDER — GUAIFENESIN-DM 100-10 MG/5ML PO SYRP: 15.0000 mL | ORAL_SOLUTION | ORAL | Status: DC | PRN

## 2012-02-07 MED ORDER — SODIUM CHLORIDE 0.9 % IV SOLN: INTRAVENOUS | Status: DC

## 2012-02-07 MED ORDER — LACTATED RINGERS IV SOLN
INTRAVENOUS | Status: DC
  Administered 2012-02-07: 16:00:00 via INTRAVENOUS

## 2012-02-07 MED ORDER — ROCURONIUM BROMIDE 100 MG/10ML IV SOLN
INTRAVENOUS | Status: DC | PRN
  Administered 2012-02-07: 30 mg via INTRAVENOUS

## 2012-02-07 MED ORDER — SODIUM CHLORIDE 0.9 % IR SOLN
Status: DC | PRN
  Administered 2012-02-07: 17:00:00

## 2012-02-07 MED ORDER — PHENOL 1.4 % MT LIQD: 1.0000 | OROMUCOSAL | Status: DC | PRN

## 2012-02-07 MED ORDER — FENTANYL CITRATE 0.05 MG/ML IJ SOLN
INTRAMUSCULAR | Status: DC | PRN
  Administered 2012-02-07: 100 ug via INTRAVENOUS
  Administered 2012-02-07: 150 ug via INTRAVENOUS

## 2012-02-07 MED ORDER — LIDOCAINE HCL (CARDIAC) 20 MG/ML IV SOLN
INTRAVENOUS | Status: DC | PRN
  Administered 2012-02-07: 50 mg via INTRAVENOUS

## 2012-02-07 MED ORDER — INSULIN ASPART 100 UNIT/ML ~~LOC~~ SOLN
0.0000 [IU] | Freq: Three times a day (TID) | SUBCUTANEOUS | Status: DC
  Administered 2012-02-08: 3 [IU] via SUBCUTANEOUS

## 2012-02-07 MED ORDER — HYDRALAZINE HCL 20 MG/ML IJ SOLN: 10.0000 mg | INTRAMUSCULAR | Status: DC | PRN

## 2012-02-07 MED ORDER — PANTOPRAZOLE SODIUM 40 MG PO TBEC
40.0000 mg | DELAYED_RELEASE_TABLET | Freq: Every day | ORAL | Status: DC
  Administered 2012-02-07: 40 mg via ORAL
  Filled 2012-02-07 (×2): qty 1

## 2012-02-07 MED ORDER — SODIUM CHLORIDE 0.9 % IV SOLN: 500.0000 mL | Freq: Once | INTRAVENOUS | Status: AC | PRN

## 2012-02-07 MED ORDER — MEPERIDINE HCL 25 MG/ML IJ SOLN: 6.2500 mg | INTRAMUSCULAR | Status: DC | PRN

## 2012-02-07 MED ORDER — PROPOFOL 10 MG/ML IV BOLUS
INTRAVENOUS | Status: DC | PRN
  Administered 2012-02-07: 140 mg via INTRAVENOUS

## 2012-02-07 MED ORDER — DOPAMINE-DEXTROSE 3.2-5 MG/ML-% IV SOLN: 3.0000 ug/kg/min | INTRAVENOUS | Status: DC | PRN

## 2012-02-07 MED ORDER — MUPIROCIN 2 % EX OINT
TOPICAL_OINTMENT | CUTANEOUS | Status: AC
  Filled 2012-02-07: qty 22

## 2012-02-07 MED ORDER — OXYCODONE-ACETAMINOPHEN 5-325 MG PO TABS
1.0000 | ORAL_TABLET | ORAL | Status: DC | PRN
  Administered 2012-02-08: 2 via ORAL
  Filled 2012-02-07: qty 2

## 2012-02-07 MED ORDER — NEOSTIGMINE METHYLSULFATE 1 MG/ML IJ SOLN
INTRAMUSCULAR | Status: DC | PRN
  Administered 2012-02-07: 3 mg via INTRAVENOUS

## 2012-02-07 MED ORDER — POTASSIUM CHLORIDE CRYS ER 20 MEQ PO TBCR: 20.0000 meq | EXTENDED_RELEASE_TABLET | Freq: Every day | ORAL | Status: DC | PRN

## 2012-02-07 MED ORDER — NITROGLYCERIN 0.4 MG SL SUBL: 0.4000 mg | SUBLINGUAL_TABLET | SUBLINGUAL | Status: DC | PRN

## 2012-02-07 MED ORDER — CEFUROXIME SODIUM 1.5 G IJ SOLR
INTRAMUSCULAR | Status: AC
  Filled 2012-02-07: qty 1.5

## 2012-02-07 MED ORDER — PREGABALIN 50 MG PO CAPS
100.0000 mg | ORAL_CAPSULE | Freq: Two times a day (BID) | ORAL | Status: DC
  Administered 2012-02-08: 100 mg via ORAL
  Filled 2012-02-07: qty 2

## 2012-02-07 MED ORDER — DEXTROSE 5 % IV SOLN
INTRAVENOUS | Status: AC
  Filled 2012-02-07: qty 50

## 2012-02-07 SURGICAL SUPPLY — 62 items
APL SKNCLS STERI-STRIP NONHPOA (GAUZE/BANDAGES/DRESSINGS) ×1
BAG ISL DRAPE 18X18 STRL (DRAPES) ×1
BAG ISOLATION DRAPE 18X18 (DRAPES) IMPLANT
BANDAGE ESMARK 6X9 LF (GAUZE/BANDAGES/DRESSINGS) IMPLANT
BENZOIN TINCTURE PRP APPL 2/3 (GAUZE/BANDAGES/DRESSINGS) ×2 IMPLANT
BNDG CMPR 9X6 STRL LF SNTH (GAUZE/BANDAGES/DRESSINGS) ×1
BNDG ESMARK 6X9 LF (GAUZE/BANDAGES/DRESSINGS) ×2
CANISTER SUCTION 2500CC (MISCELLANEOUS) ×2 IMPLANT
CANNULA VESSEL W/WING WO/VALVE (CANNULA) ×2 IMPLANT
CATH EMB 4FR 80CM (CATHETERS) ×1 IMPLANT
CLIP FOGARTY SPRING 6M (CLIP) IMPLANT
CLIP LIGATING EXTRA MED SLVR (CLIP) ×2 IMPLANT
CLIP LIGATING EXTRA SM BLUE (MISCELLANEOUS) ×2 IMPLANT
CLOTH BEACON ORANGE TIMEOUT ST (SAFETY) ×2 IMPLANT
CLSR STERI-STRIP ANTIMIC 1/2X4 (GAUZE/BANDAGES/DRESSINGS) ×1 IMPLANT
COVER SURGICAL LIGHT HANDLE (MISCELLANEOUS) ×2 IMPLANT
CUFF TOURNIQUET SINGLE 34IN LL (TOURNIQUET CUFF) IMPLANT
CUFF TOURNIQUET SINGLE 44IN (TOURNIQUET CUFF) IMPLANT
DRAIN SNY 10X20 3/4 PERF (WOUND CARE) IMPLANT
DRAPE ISOLATION BAG 18X18 (DRAPES) ×1
DRAPE WARM FLUID 44X44 (DRAPE) ×2 IMPLANT
DRAPE X-RAY CASS 24X20 (DRAPES) IMPLANT
DRSG COVADERM 4X10 (GAUZE/BANDAGES/DRESSINGS) IMPLANT
DRSG COVADERM 4X8 (GAUZE/BANDAGES/DRESSINGS) ×1 IMPLANT
ELECT REM PT RETURN 9FT ADLT (ELECTROSURGICAL) ×2
ELECTRODE REM PT RTRN 9FT ADLT (ELECTROSURGICAL) ×1 IMPLANT
EVACUATOR SILICONE 100CC (DRAIN) IMPLANT
GLOVE BIO SURGEON STRL SZ 6 (GLOVE) ×1 IMPLANT
GLOVE BIOGEL PI IND STRL 7.0 (GLOVE) IMPLANT
GLOVE BIOGEL PI INDICATOR 7.0 (GLOVE) ×1
GLOVE SS BIOGEL STRL SZ 7.5 (GLOVE) ×1 IMPLANT
GLOVE SUPERSENSE BIOGEL SZ 7.5 (GLOVE) ×1
GLOVE SURG SS PI 7.0 STRL IVOR (GLOVE) ×1 IMPLANT
GOWN BRE IMP SLV SIRUS LXLNG (GOWN DISPOSABLE) ×1 IMPLANT
GOWN STRL NON-REIN LRG LVL3 (GOWN DISPOSABLE) ×6 IMPLANT
INSERT FOGARTY SM (MISCELLANEOUS) IMPLANT
KIT BASIN OR (CUSTOM PROCEDURE TRAY) ×2 IMPLANT
KIT ROOM TURNOVER OR (KITS) ×2 IMPLANT
NS IRRIG 1000ML POUR BTL (IV SOLUTION) ×4 IMPLANT
PACK PERIPHERAL VASCULAR (CUSTOM PROCEDURE TRAY) ×2 IMPLANT
PAD ARMBOARD 7.5X6 YLW CONV (MISCELLANEOUS) ×4 IMPLANT
PAD CAST 4YDX4 CTTN HI CHSV (CAST SUPPLIES) IMPLANT
PADDING CAST COTTON 4X4 STRL (CAST SUPPLIES) ×2
PADDING CAST COTTON 6X4 STRL (CAST SUPPLIES) ×1 IMPLANT
SET COLLECT BLD 21X3/4 12 (NEEDLE) IMPLANT
STAPLER VISISTAT 35W (STAPLE) IMPLANT
STOPCOCK 4 WAY LG BORE MALE ST (IV SETS) IMPLANT
STRIP CLOSURE SKIN 1/2X4 (GAUZE/BANDAGES/DRESSINGS) ×2 IMPLANT
SUT ETHILON 3 0 PS 1 (SUTURE) IMPLANT
SUT PROLENE 5 0 C 1 24 (SUTURE) ×2 IMPLANT
SUT PROLENE 6 0 CC (SUTURE) ×3 IMPLANT
SUT SILK 2 0 SH (SUTURE) ×2 IMPLANT
SUT VIC AB 2-0 CTX 36 (SUTURE) ×4 IMPLANT
SUT VIC AB 3-0 SH 27 (SUTURE) ×4
SUT VIC AB 3-0 SH 27X BRD (SUTURE) ×2 IMPLANT
SYR 3ML LL SCALE MARK (SYRINGE) ×1 IMPLANT
TOWEL OR 17X24 6PK STRL BLUE (TOWEL DISPOSABLE) ×4 IMPLANT
TOWEL OR 17X26 10 PK STRL BLUE (TOWEL DISPOSABLE) ×4 IMPLANT
TRAY FOLEY CATH 14FRSI W/METER (CATHETERS) ×2 IMPLANT
TUBING EXTENTION W/L.L. (IV SETS) IMPLANT
UNDERPAD 30X30 INCONTINENT (UNDERPADS AND DIAPERS) ×2 IMPLANT
WATER STERILE IRR 1000ML POUR (IV SOLUTION) ×2 IMPLANT

## 2012-02-07 NOTE — Progress Notes (Signed)
Pt stated he had a little bit of fruit- grapes, strawberries and little pieces of apple totalling about 2 oz at 1130. Dr. Sharee Holster notified and it's okay.  Dr. Jacklynn Bue also notified of patient's ICD and that patient states it has not gone off in the last 6 months per patient. Dr. Jacklynn Bue states it's okay.

## 2012-02-07 NOTE — Op Note (Signed)
OPERATIVE REPORT  DATE OF SURGERY: 02/07/2012  PATIENT: [name], 77 y.o. male MRN: [id number]  DOB: [birth year]  PRE-OPERATIVE DIAGNOSIS: Occluded left femoral to anterior tibial bypass with ischemia  POST-OPERATIVE DIAGNOSIS:  Same  PROCEDURE: Revision of left femoral to anterior tibial bypass with thrombectomy and jump to more distal anterior tibial artery  SURGEON:  Gretta Began, M.D.  PHYSICIAN ASSISTANT: Roczniak  ANESTHESIA:  Gen.  EBL: Minimal ml     BLOOD ADMINISTERED: None  DRAINS: None  SPECIMEN: None  COUNTS CORRECT:  YES  PLAN OF CARE: PACU   PATIENT DISPOSITION:  PACU - hemodynamically stable  PROCEDURE DETAILS: The patient is a 76 year old gentleman who underwent left femoral to anterior tibial bypass with Dr. Hart Rochester in July 2013. He had duplex suggesting distal anastomotic stenosis. He underwent a formal arteriogram in October 2013 showing no evidence of stenosis. He presented to the office today with several day history of progressive pain including rest pain in his left foot. He does have chronic bilateral neuropathy. On physical exam he was found to have a graft pulse but no flow through the distal anastomosis and this was confirmed with duplex. Recommended he undergo exploration and probable revision. Did explain he would need harvested vein from another site either the small saphenous on the left or the great saphenous on the right. He does have a palpable right popliteal pulse.  The patient was taken up and placed supine position where the area of both legs were imaged with ultrasound. This showed a very small atretic small saphenous vein on the left normal caliber saphenous vein at the right. Incision was made over the prior lateral approach to the anterior tibial artery on the left and carried down to isolate the vein graft. There was a dense scar around the anastomosis. The anastomosis was exposed and the anterior tibial artery proximally 1 cm distal  to the prior anastomosis was also exposed. There was some thickening but this did appear to be of good caliber for anastomosis. Scissors made over the distal calf on the right over the level of the saphenous vein and this was harvested ligating it proximally distally and tying off the small branches with a 4-0 silk ties. This was gently dilated and was of good caliber and was marked to prevent twisting. A pneumatic tourniquet was placed above the knee on the left. The patient was given 8000 intravenous heparin. After extrication time the leg was elevated and exsanguinated with Esmarch tourniquet. The pneumatic tourniquet was inflated to 250 mm of mercury. Anterior tibial artery was opened with an 11 blade and extended Junior Potts scissors. The saphenous vein graft was brought onto the field. The vein was reversed and spatulated. The vein was sewn end-to-side to the anterior tibial artery with a running 6-0 Prolene suture. A 2 dilator passed through the distal anastomosis. The tourniquet was deflated and good backbleeding was encountered in the anastomosis was completed. This was flushed with heparinized and reoccluded. Next the old anastomosis was doubly ligated with 2-0 silk tie. The vein was transected near the old anastomosis. This was a thrombosed. The vein was exposed further proximally and was opened proximal to 4 cm from the old anastomosis and extended the vein was of good caliber at this location. A 4 Fogarty catheter was passed proximally and distal control was used over the vein and the lateral aspect of the thigh. The 30 catheter easily passed to the proximal anastomosis and there was no further thrombus removed. There  was good flow through the vein graft. This was flushed with heparinized and reoccluded. The vein was spatulated from the old vein graft and also the new interposition graft was cut to appropriate length was spatulated and was sewn end to end to the old vein graft with a running 6-0  Prolene suture. Clamps removed and good graft-dependent Doppler vessel flow was noted in the foot. The patient was given 50 mg of protamine to reverse heparin. Wound irrigated with saline. Hemostasis daily cautery. Wounds were closed with 3-0 Vicryl the subcutaneous cutaneous tissue skin was closed with a 304 as a protector Vicryl sutures. Sterile dressing was applied and the patient was taken to the recovery room in stable condition   Gretta Began, M.D. 02/07/2012 7:11 PM

## 2012-02-07 NOTE — H&P (Signed)
History and Physical  [name] is a 77 y.o. male who had a left femoral to AT bypass done by Dr Hart Rochester. F/U Arterial duplex showed possible vein graft stenosis and pt had Arteriorgram by Dr. Myra Gianotti in October. Pt. Called 02/06/12 to report noticeable increased discomfort in left foot over past 2 weeks, and an intermittent stabbing pain in left lower leg that started yesterday. Denies swelling of left foot/leg. States has numbness in left foot, but states due to neuropathy, this is not a change. He was seen in the office today and found to have occluded graft. He is being admitted for Thrombectomy and revision of left femoral to tibial bypass  Findings: Angio Oct 2013  Aortogram: The visualized portions of the suprarenal abdominal aorta showed no significant disease. There is no evidence of renal artery stenosis. The infrarenal abdominal aorta is widely patent. The left common and external iliac arteries are widely patent. There is luminal irregularity within the distal right common iliac and proximal right external iliac artery. However, there is no pressure gradient across this area.  Left Lower Extremity: The left common femoral and profunda femoral arteries are widely patent. The superficial femoral artery is occluded. There is a bypass graft originating from the distal left common femoral artery. The distal anastomosis is to the proximal anterior tibial artery. There is no evidence of stenosis within the proximal or distal anastomosis. Bypass graft is widely patent. I do not see any evidence of a stenosis that was identified on duplex ultrasound. The anterior tibial artery is the dominant runoff.  Intervention: None  Impression:  #1 no significant aortoiliac occlusive disease. However there is a luminal irregularity in the distal right common iliac and proximal right external iliac artery. There was not a significant pressure gradient across this lesion.  #2 the left femoral to anterior tibial  artery bypass graft is widely patent. No evidence of stenosis is seen at the proximal or distal anastomosis. The area of concern on ultrasound does not appear to be hemodynamically significant.  Filed Vitals:    02/07/12 1252   Height:  5\' 8"  (1.727 m)   Weight:  169 lb (76.658 kg)    Past Medical History   Diagnosis  Date   .  Hyperlipidemia    .  Hypertension    .  Gout    .  Benign prostatic hypertrophy    .  Atrial fibrillation      sees Dr. Verdis Prime   .  Chronic systolic dysfunction of left ventricle      a. mixed ischemic and nonischemic CM, EF 35%. b. s/p AICD implantation.   Marland Kitchen  CAD (coronary artery disease)      a. s/p mid LAD stenting with DES 2008   .  CHF (congestive heart failure)    .  PAD (peripheral artery disease)      Severe by PV angiogram 09/2011   .  ED (erectile dysfunction)    .  Renal artery stenosis      s/p stenting 2009   .  Diabetes mellitus      sees Dr. Everardo All   .  Arthritis     ROS: [x]  Positive [ ]  Denies  General: [ ]  Weight loss, [ ]  Fever, [ ]  chills  Neurologic: [ ]  Dizziness, [ ]  Blackouts, [ ]  Seizure  [ ]  Stroke, [ ]  "Mini stroke", [ ]  Slurred speech, [ ]  Temporary blindness; [ ]  weakness in arms or legs, [ ]   Hoarseness  Cardiac: [ ]  Chest pain/pressure, [ ]  Shortness of breath at rest [ ]  Shortness of breath with exertion, [ ]  Atrial fibrillation or irregular heartbeat  Vascular: [ ]  Pain in legs with walking, [ ]  Pain in legs at rest, [ ]  Pain in legs at night,  [ ]  Non-healing ulcer, [ ]  Blood clot in vein/DVT,  Pulmonary: [ ]  Home oxygen, [ ]  Productive cough, [ ]  Coughing up blood, [ ]  Asthma,  [ ]  Wheezing  Musculoskeletal: [ ]  Arthritis, [ ]  Low back pain, [ ]  Joint pain  Hematologic: [ ]  Easy Bruising, [ ]  Anemia; [ ]  Hepatitis  Gastrointestinal: [ ]  Blood in stool, [ ]  Gastroesophageal Reflux/heartburn, [ ]  Trouble swallowing  Urinary: [ ]  chronic Kidney disease, [ ]  on HD - [ ]  MWF or [ ]  TTHS, [ ]  Burning with urination, [ ]   Difficulty urinating  Skin: [ ]  Rashes, [ ]  Wounds  Psychological: [ ]  Anxiety, [ ]  Depression  Social History  History   Substance Use Topics   .  Smoking status:  Never Smoker   .  Smokeless tobacco:  Never Used      Comment: 1-2 cigars when golfing   .  Alcohol Use:  1.5 oz/week     3 drink(s) per week    Family History  Family History   Problem  Relation  Age of Onset   .  Cancer        breast/fhx    .  Heart disease        fhx    .  Diabetes  Neg Hx    .  Cancer  Father     No Known Allergies  Current Outpatient Prescriptions   Medication  Sig  Dispense  Refill   .  allopurinol (ZYLOPRIM) 100 MG tablet      .  aspirin EC 81 MG tablet  Take 1 tablet (81 mg total) by mouth daily.  60 tablet  5   .  carvedilol (COREG) 6.25 MG tablet  Take 1 tablet (6.25 mg total) by mouth 2 (two) times daily with a meal.  60 tablet  5   .  finasteride (PROSCAR) 5 MG tablet  Take 5 mg by mouth daily.     .  furosemide (LASIX) 40 MG tablet  Take 40 mg by mouth daily.     Marland Kitchen  HYDROcodone-acetaminophen (NORCO) 10-325 MG per tablet      .  insulin aspart protamine-insulin aspart (NOVOLOG 70/30) (70-30) 100 UNIT/ML injection  Inject 6-10 Units into the skin 2 (two) times daily with a meal. 10 units with breakfast, and 6 units with the evening meal     .  losartan (COZAAR) 100 MG tablet  Take 100 mg by mouth daily.     Marland Kitchen  NITROSTAT 0.4 MG SL tablet      .  oxycodone-acetaminophen (PERCOCET) 2.5-325 MG per tablet  Take 1 tablet by mouth every 4 (four) hours as needed. For pain     .  pregabalin (LYRICA) 100 MG capsule  Take 100 mg by mouth 2 (two) times daily.     .  Pyridoxine HCl (VITAMIN B-6 PO)  Take 1 tablet by mouth daily.     .  traMADol (ULTRAM) 50 MG tablet  Take 50 mg by mouth at bedtime as needed. For pain.     .  vitamin E 1000 UNIT capsule  Take 1,000 Units by mouth daily.     Marland Kitchen  cephALEXin (KEFLEX) 500 MG capsule  Take 1 capsule (500 mg total) by mouth 3 (three) times daily.  30  capsule  0   .  gabapentin (NEURONTIN) 300 MG capsule  Take 300 mg by mouth 3 (three) times daily.      Physical Examination  Filed Vitals:    02/07/12 1252   BP:  184/97   Pulse:  71   Resp:  18    Body mass index is 25.70 kg/(m^2).  General: WDWN in NAD  Gait: Normal  HEENT: WNL  Eyes: Pupils equal  Pulmonary: normal non-labored breathing , without Rales, rhonchi, wheezing  Cardiac: RRR, without Murmurs, rubs or gallops;  No carotid bruits  Abdomen: soft, NT, no masses  Skin: no rashes, ulcers noted  Vascular Exam/Pulses: Right lower extremity has palpable pulses from the femoral to the tibial, there is are no palpable pulses in the left lower extremity beyond the femoral pulse  Extremities without ischemic changes, no Gangrene , no cellulitis; no open wounds;  Musculoskeletal: no muscle wasting or atrophy  Neurologic: A&O X 3; Appropriate Affect ; SENSATION: normal; MOTOR FUNCTION: moving all extremities equally. Speech is fluent/normal   Non-Invasive Vascular Imaging: ABIs today are 0.96 on the right, 0.62 on the left which is suggestive of of moderate arterial occlusive disease, there is a monophasic left branch noted with abnormally preocclusive waveforms noted in the proximal graft and a thrombus noted distally with an occluded distal graft anastomosis.   ASSESSMENT/PLAN: Dr. Imogene Burn examined the patient and explained to him that he would need this surgically corrected. Dr. Imogene Burn spoke with Dr. Arbie Cookey who is going to perform a thrombectomy on the patient today. The patient is being sent directly to [facility] short stay for the procedure. His followup in clinic will be pending that procedure. The patient's questions were encouraged and answered, he is in agreement with this plan.  Lauree Chandler ANP

## 2012-02-07 NOTE — Preoperative (Signed)
Beta Blockers   Reason not to administer Beta Blockers:Not Applicable 

## 2012-02-07 NOTE — Transfer of Care (Signed)
Immediate Anesthesia Transfer of Care Note  Patient: [name]  Procedure(s) Performed: Procedure(s) (LRB) with comments: BYPASS GRAFT FEMORAL-TIBIAL ARTERY (Left) - Thrombectomy Left Femoral - Tibial Bypass Graft EMBOLECTOMY (Left)  Patient Location: PACU  Anesthesia Type:General  Level of Consciousness: awake, alert  and oriented  Airway & Oxygen Therapy: Patient Spontanous Breathing and Patient connected to face mask oxygen  Post-op Assessment: Report given to PACU RN and Post -op Vital signs reviewed and stable  Post vital signs: Reviewed and stable  Complications: No apparent anesthesia complications

## 2012-02-07 NOTE — Progress Notes (Signed)
[facility] [place] HISTORY AND PHYSICAL -PAD CC: Painful left lower extremity times one day Referring Physician: Margurite Auerbach  History of Present Illness  [name] is a 77 y.o. male patient who presents with chief complaint of left lower extremity PAD. Pt. reports rest pain; reports night pain denies non healing ulcers on left lower extremity.  Pt has had previous intervention of Left Vascular Bypass .  Past Medical History  Diagnosis Date  . Hyperlipidemia   . Hypertension   . Gout   . Benign prostatic hypertrophy   . Atrial fibrillation     sees Dr. Verdis Prime  . Chronic systolic dysfunction of left ventricle     a. mixed ischemic and nonischemic CM,  EF 35%. b. s/p AICD implantation.  Marland Kitchen CAD (coronary artery disease)     a. s/p mid LAD stenting with DES 2008  . CHF (congestive heart failure)   . PAD (peripheral artery disease)     Severe by PV angiogram 09/2011  . ED (erectile dysfunction)   . Renal artery stenosis     s/p stenting 2009  . Diabetes mellitus     sees Dr. Everardo All   . Arthritis     Social History History  Substance Use Topics  . Smoking status: Never Smoker   . Smokeless tobacco: Never Used     Comment: 1-2 cigars when golfing  . Alcohol Use: 1.5 oz/week    3 drink(s) per week    Family History Family History  Problem Relation Age of Onset  . Cancer      breast/fhx  . Heart disease      fhx  . Diabetes Neg Hx   . Cancer Father     ROS: [x]  Positive   [ ]  Denies  General:[ ]  Weight loss,  [ ]  Weight gain, [ ]  Fever, [ ]  chills Neurologic: [ ]  Dizziness, [ ]  Blackouts, [ ]  Seizure [ ]  Stroke, [ ]  "Mini stroke", [ ]  Slurred speech, [ ]  Temporary blindness;  [ ] weakness, [ ]  Hoarseness Cardiac: [ ]  Chest pain/pressure, [ ]  Shortness of breath at rest [ ]  Shortness of breath with exertion,  [ ]   Atrial fibrillation or irregular heartbeat Vascular:[ ]  Pain in legs with walking, [ ]  Pain in legs at rest ,[ ]  Pain  in legs at night,  [ ]   Non-healing ulcer, [ ]  Blood clot in vein/DVT,   Pulmonary: [ ]  Home oxygen, [ ]   Productive cough, [ ]  Coughing up blood,  [ ]  Asthma,  [ ]  Wheezing Musculoskeletal:  [ ]  Arthritis, [ ]  Low back pain,  [ ]  Joint pain Hematologic:[ ]  Easy Bruising, [ ]  Anemia; [ ]  Hepatitis Gastrointestinal: [ ]  Blood in stool,  [ ]  Gastroesophageal Reflux, [ ]  Trouble swallowing Urinary: [ ]  chronic Kidney disease, [ ]  on HD, [ ]  Burning with urination, [ ]  Frequent urination, [ ]  Difficulty urinating;  Skin: [ ]  Rashes, [ ]  Wounds    No Known Allergies  No current outpatient prescriptions on file.    Physical Examination  Filed Vitals:   02/07/12 1252  BP: 184/97  Pulse: 71  Resp: 18    General: A&O x 3, WDWN,  Gait: normal Eyes: PERRLA, Pulmonary: CTAB, without wheezes , rales or rhonchi Cardiac: regular Rythm , without murmur  RIGHT                                       LEFT  CAROTID BRUIT Negative Negative  VASCULAR EXAM: Extremities without ischemic changes in the left lower extremity although he is slightly discolored without Gangrene of left lower extremity.                                                                                                   LOWER EXTREMITY PULSES           RIGHT                                      LEFT      FEMORAL  present and palpable  absent and palpable        POPLITEAL  weak and palpable   weak and palpable       POSTERIOR TIBIAL  absent and palpable   absent and palpable            Abdomen: soft, NT, no masses Skin: no rashes, ulcers noted Musculoskeletal: no muscle wasting or atrophy  Neurologic: A&O X 3; Appropriate Affect ; SENSATION: normal; MOTOR FUNCTION:  moving all extremities equally. Speech is fluent/normal    Non-Invasive Vascular Imaging: DATE: 02/07/2012 ABI: RIGHT 0.96;  LEFT 0.62 DUPLEX SCAN OF BYPASS: Occluded distal graft  anastomosis  Previous angiogram: Yes with findings of patency throughout the bypass  Outside Studies/Documentation  ASSESSMENT: [name] is a 77 y.o. male who presents with: left lower PAD WITH an occluded bypass graft with thrombosis.Marland Kitchen   PLAN: Based on the patient's vascular studies and examination, pt will undergo a thrombectomy with Dr. Arbie Cookey today. Dr. Imogene Burn spoke with Dr. Arbie Cookey in the patient will be sent to short stay for a procedure later today. His followup here in clinic will be pending that procedure.  Lauree Chandler ANP Clinic MD: Imogene Burn

## 2012-02-07 NOTE — Progress Notes (Signed)
[facility]  PAD/PVD Office Note  CC: Complaints of left lower extremity pain x1 day Referring Physician: Hart Rochester  History of Present Illness: 77 year old male patient of Dr. Hart Rochester who is status post a left femoral to anterior tibial bypass graft in July 2013. The patient called the office stating that yesterday he started having increased pain in his left lower extremity with paresthesia-like sensation as well as some mild discoloration. The patient does have some rest pain as well as increasing claudication.  Past Medical History  Diagnosis Date  . Hyperlipidemia   . Hypertension   . Gout   . Benign prostatic hypertrophy   . Atrial fibrillation     sees Dr. Verdis Prime  . Chronic systolic dysfunction of left ventricle     a. mixed ischemic and nonischemic CM,  EF 35%. b. s/p AICD implantation.  Marland Kitchen CAD (coronary artery disease)     a. s/p mid LAD stenting with DES 2008  . CHF (congestive heart failure)   . PAD (peripheral artery disease)     Severe by PV angiogram 09/2011  . ED (erectile dysfunction)   . Renal artery stenosis     s/p stenting 2009  . Diabetes mellitus     sees Dr. Everardo All   . Arthritis     ROS: [x]  Positive   [ ]  Denies    General: [ ]  Weight loss, [ ]  Fever, [ ]  chills Neurologic: [ ]  Dizziness, [ ]  Blackouts, [ ]  Seizure [ ]  Stroke, [ ]  "Mini stroke", [ ]  Slurred speech, [ ]  Temporary blindness; [ ]  weakness in arms or legs, [ ]  Hoarseness Cardiac: [ ]  Chest pain/pressure, [ ]  Shortness of breath at rest [ ]  Shortness of breath with exertion, [ ]  Atrial fibrillation or irregular heartbeat Vascular: [ ]  Pain in legs with walking, [ ]  Pain in legs at rest, [ ]  Pain in legs at night,  [ ]  Non-healing ulcer, [ ]  Blood clot in vein/DVT,   Pulmonary: [ ]  Home oxygen, [ ]  Productive cough, [ ]  Coughing up blood, [ ]  Asthma,  [ ]  Wheezing Musculoskeletal:  [ ]  Arthritis, [ ]  Low back pain, [ ]  Joint pain Hematologic: [ ]  Easy Bruising,  [ ]  Anemia; [ ]  Hepatitis Gastrointestinal: [ ]  Blood in stool, [ ]  Gastroesophageal Reflux/heartburn, [ ]  Trouble swallowing Urinary: [ ]  chronic Kidney disease, [ ]  on HD - [ ]  MWF or [ ]  TTHS, [ ]  Burning with urination, [ ]  Difficulty urinating Skin: [ ]  Rashes, [ ]  Wounds Psychological: [ ]  Anxiety, [ ]  Depression   Social History History  Substance Use Topics  . Smoking status: Never Smoker   . Smokeless tobacco: Never Used     Comment: 1-2 cigars when golfing  . Alcohol Use: 1.5 oz/week    3 drink(s) per week    Family History Family History  Problem Relation Age of Onset  . Cancer      breast/fhx  . Heart disease      fhx  . Diabetes Neg Hx   . Cancer Father     No Known Allergies  Current Outpatient Prescriptions  Medication Sig Dispense Refill  . allopurinol (ZYLOPRIM) 100 MG tablet       . aspirin EC 81 MG tablet Take 1 tablet (81 mg total) by mouth daily.  60 tablet  5  . carvedilol (COREG) 6.25 MG tablet Take 1 tablet (6.25 mg total) by mouth 2 (two) times daily  with a meal.  60 tablet  5  . finasteride (PROSCAR) 5 MG tablet Take 5 mg by mouth daily.      . furosemide (LASIX) 40 MG tablet Take 40 mg by mouth daily.      Marland Kitchen HYDROcodone-acetaminophen (NORCO) 10-325 MG per tablet       . insulin aspart protamine-insulin aspart (NOVOLOG 70/30) (70-30) 100 UNIT/ML injection Inject 6-10 Units into the skin 2 (two) times daily with a meal. 10 units with breakfast, and 6 units with the evening meal      . losartan (COZAAR) 100 MG tablet Take 100 mg by mouth daily.      Marland Kitchen NITROSTAT 0.4 MG SL tablet       . oxycodone-acetaminophen (PERCOCET) 2.5-325 MG per tablet Take 1 tablet by mouth every 4 (four) hours as needed. For pain      . pregabalin (LYRICA) 100 MG capsule Take 100 mg by mouth 2 (two) times daily.      . Pyridoxine HCl (VITAMIN B-6 PO) Take 1 tablet by mouth daily.       . traMADol (ULTRAM) 50 MG tablet Take 50 mg by mouth at bedtime as needed. For pain.       . vitamin E 1000 UNIT capsule Take 1,000 Units by mouth daily.        . cephALEXin (KEFLEX) 500 MG capsule Take 1 capsule (500 mg total) by mouth 3 (three) times daily.  30 capsule  0  . gabapentin (NEURONTIN) 300 MG capsule Take 300 mg by mouth 3 (three) times daily.        Physical Examination  Filed Vitals:   02/07/12 1252  BP: 184/97  Pulse: 71  Resp: 18    Body mass index is 25.70 kg/(m^2).  General:  WDWN in NAD Gait: Normal HEENT: WNL Eyes: Pupils equal Pulmonary: normal non-labored breathing , without Rales, rhonchi,  wheezing Cardiac: RRR, without  Murmurs, rubs or gallops; No carotid bruits Abdomen: soft, NT, no masses Skin: no rashes, ulcers noted Vascular Exam/Pulses: Right lower extremity has palpable pulses from the femoral to the tibial, there is are no palpable pulses in the left lower extremity beyond the femoral pulse  Extremities without ischemic changes, no Gangrene , no cellulitis; no open wounds;  Musculoskeletal: no muscle wasting or atrophy  Neurologic: A&O X 3; Appropriate Affect ; SENSATION: normal; MOTOR FUNCTION:  moving all extremities equally. Speech is fluent/normal  Non-Invasive Vascular Imaging: ABIs today are 0.96 on the right, 0.62 on the left which is suggestive of of moderate arterial occlusive disease, there is a monophasic left branch noted with abnormally preocclusive waveforms noted in the proximal graft and a thrombus noted distally with an occluded distal graft anastomosis.  ASSESSMENT/PLAN: Dr. Imogene Burn examined the patient and explained to him that he would need this surgically corrected. Dr. Imogene Burn spoke with Dr. Arbie Cookey who is going to perform a thrombectomy on the patient today. The patient is being sent directly to [facility] short stay for the procedure. His followup in clinic will be pending that procedure. The patient's questions were encouraged and answered, he is in agreement with this plan.  Lauree Chandler ANP  M.D.: Imogene Burn

## 2012-02-07 NOTE — H&P (Signed)
[name] is a 77 y.o. male who had a left femoral to AT bypass done by Dr Hart Rochester. F/U Arterial duplex showed possible vein graft stenosis and pt had Arteriorgram by Dr. Myra Gianotti in October. Pt. Called 02/06/12 to report noticeable increased discomfort in left foot over past 2 weeks, and an intermittent stabbing pain in left lower leg that started yesterday. Denies swelling of left foot/leg. States has numbness in left foot, but states due to neuropathy, this is not a change. He was seen in the office today and found to have occluded graft. He is being admitted for Thrombectomy and revision of left femoral to tibial bypass  Findings: Angio Oct 2013 Aortogram: The visualized portions of the suprarenal abdominal aorta showed no significant disease. There is no evidence of renal artery stenosis. The infrarenal abdominal aorta is widely patent. The left common and external iliac arteries are widely patent. There is luminal irregularity within the distal right common iliac and proximal right external iliac artery. However, there is no pressure gradient across this area.  Left Lower Extremity: The left common femoral and profunda femoral arteries are widely patent. The superficial femoral artery is occluded. There is a bypass graft originating from the distal left common femoral artery. The distal anastomosis is to the proximal anterior tibial artery. There is no evidence of stenosis within the proximal or distal anastomosis. Bypass graft is widely patent. I do not see any evidence of a stenosis that was identified on duplex ultrasound. The anterior tibial artery is the dominant runoff.  Intervention: None  Impression:  #1 no significant aortoiliac occlusive disease. However there is a luminal irregularity in the distal right common iliac and proximal right external iliac artery. There was not a significant pressure gradient across this lesion.  #2 the left femoral to anterior tibial artery bypass graft is  widely patent. No evidence of stenosis is seen at the proximal or distal anastomosis. The area of concern on ultrasound does not appear to be hemodynamically significant.    Filed Vitals:   02/07/12 1252  Height: 5\' 8"  (1.727 m)  Weight: 169 lb (76.658 kg)

## 2012-02-07 NOTE — Anesthesia Postprocedure Evaluation (Signed)
  Anesthesia Post-op Note  Patient: [name]  Procedure(s) Performed: Procedure(s) (LRB) with comments: BYPASS GRAFT FEMORAL-TIBIAL ARTERY (Left) - Thrombectomy Left Femoral - Tibial Bypass Graft EMBOLECTOMY (Left)  Patient Location: PACU  Anesthesia Type:General  Level of Consciousness: awake, alert , oriented and patient cooperative  Airway and Oxygen Therapy: Patient Spontanous Breathing and Patient connected to nasal cannula oxygen  Post-op Pain: none  Post-op Assessment: Post-op Vital signs reviewed, Patient's Cardiovascular Status Stable, Respiratory Function Stable, Patent Airway, No signs of Nausea or vomiting and Pain level controlled  Post-op Vital Signs: Reviewed and stable  Complications: No apparent anesthesia complications

## 2012-02-07 NOTE — Anesthesia Procedure Notes (Signed)
Procedure Name: Intubation Date/Time: 02/07/2012 4:31 PM Performed by: Gwenyth Allegra Pre-anesthesia Checklist: Patient identified, Timeout performed, Emergency Drugs available, Suction available and Patient being monitored Patient Re-evaluated:Patient Re-evaluated prior to inductionOxygen Delivery Method: Circle system utilized Preoxygenation: Pre-oxygenation with 100% oxygen Intubation Type: IV induction Laryngoscope Size: Mac and 3 Grade View: Grade I Tube type: Oral Tube size: 7.5 mm Number of attempts: 1 Airway Equipment and Method: Stylet Placement Confirmation: ETT inserted through vocal cords under direct vision,  breath sounds checked- equal and bilateral and positive ETCO2 Secured at: 22 cm Tube secured with: Tape Dental Injury: Teeth and Oropharynx as per pre-operative assessment

## 2012-02-07 NOTE — Anesthesia Preprocedure Evaluation (Signed)
Anesthesia Evaluation  Patient identified by MRN, date of birth, ID band Patient awake    Reviewed: Allergy & Precautions, H&P , NPO status   History of Anesthesia Complications Negative for: history of anesthetic complications  Airway Mallampati: II  Neck ROM: Full    Dental  (+) Teeth Intact   Pulmonary neg pulmonary ROS, sleep apnea ,  breath sounds clear to auscultation        Cardiovascular hypertension, + CAD, + Peripheral Vascular Disease and +CHF + dysrhythmias + pacemaker + Cardiac Defibrillator Rhythm:Regular Rate:Normal     Neuro/Psych PSYCHIATRIC DISORDERS  Neuromuscular disease    GI/Hepatic   Endo/Other  diabetes  Renal/GU      Musculoskeletal   Abdominal (+) + obese,   Peds  Hematology   Anesthesia Other Findings   Reproductive/Obstetrics                           Anesthesia Physical Anesthesia Plan  ASA: III and emergent  Anesthesia Plan: General ETT   Post-op Pain Management:    Induction:   Airway Management Planned:   Additional Equipment:   Intra-op Plan:   Post-operative Plan:   Informed Consent: I have reviewed the patients History and Physical, chart, labs and discussed the procedure including the risks, benefits and alternatives for the proposed anesthesia with the patient or authorized representative who has indicated his/her understanding and acceptance.   Dental Advisory Given  Plan Discussed with: CRNA  Anesthesia Plan Comments:         Anesthesia Quick Evaluation

## 2012-02-08 LAB — BASIC METABOLIC PANEL
BUN: 42 mg/dL — ABNORMAL HIGH (ref 6–23)
Chloride: 105 mEq/L (ref 96–112)
Creatinine, Ser: 1.89 mg/dL — ABNORMAL HIGH (ref 0.50–1.35)
GFR calc Af Amer: 38 mL/min — ABNORMAL LOW (ref >90)
Glucose, Bld: 209 mg/dL — ABNORMAL HIGH (ref 70–99)

## 2012-02-08 LAB — HEMOGLOBIN A1C: Mean Plasma Glucose: 146 mg/dL — ABNORMAL HIGH (ref <117)

## 2012-02-08 LAB — CBC
HCT: 43.4 % (ref 39.0–52.0)
MCH: 29.4 pg (ref 26.0–34.0)
MCHC: 34.1 g/dL (ref 30.0–36.0)
MCV: 86.1 fL (ref 78.0–100.0)
RDW: 14 % (ref 11.5–15.5)

## 2012-02-08 MED ORDER — OXYCODONE-ACETAMINOPHEN 5-325 MG PO TABS: 1.0000 | ORAL_TABLET | ORAL | Status: DC | PRN

## 2012-02-08 NOTE — Evaluation (Signed)
Physical Therapy Evaluation Patient Details Name: [name] MRN: [id number] DOB: 1935/01/04 Today's Date: 02/08/2012 Time: 0834-0900 PT Time Calculation (min): 26 min  PT Assessment / Plan / Recommendation Clinical Impression  [name] is 77 y/o male s/p Revision of left femoral to anterior tibial bypass with thrombectomy. Presents to PT today close to his baseline with mobility. Slight instability with gait but pt compensates well and girlfriend will be able to provide supervision. Pt educated on benefits of continued ambulation for improved strength and circulation as well as ankle pumps for range of motion in ankle. Educated pt on the benefits of outpatient physical therapy for balance but pt wants to hold off on this for now as he reports he is very active at the [facility]. No further PT needs at this time.     PT Assessment  Patent does not need any further PT services    Follow Up Recommendations  No PT follow up    Does the patient have the potential to tolerate intense rehabilitation      Barriers to Discharge        Equipment Recommendations  None recommended by PT    Recommendations for Other Services     Frequency      Precautions / Restrictions Precautions Precautions: Fall Precaution Comments: history of peripheral neuropathy in feet but compensates well with no falls in his history Restrictions Weight Bearing Restrictions: No         Mobility  Bed Mobility Bed Mobility: Supine to Sit Supine to Sit: 6: Modified independent (Device/Increase time) Transfers Transfers: Sit to Stand;Stand to Sit Sit to Stand: 5: Supervision;With upper extremity assist Stand to Sit: 6: Modified independent (Device/Increase time);With upper extremity assist Details for Transfer Assistance: uses the bed to stabilize in standing and appears slightly gaurded but no physical assist needed Ambulation/Gait Ambulation/Gait Assistance: 5: Supervision;6: Modified independent  (Device/Increase time) Ambulation Distance (Feet): 430 Feet Assistive device: None Ambulation/Gait Assistance Details: supervision given pt's slight instability but patient compensates well with no LOB noted during gait Gait Pattern: Decreased stride length;Decreased stance time - left General Gait Details: slowed with increased caution         Exercises General Exercises - Lower Extremity Ankle Circles/Pumps: AROM;Both;10 reps;Supine    Visit Information  Last PT Received On: 02/08/12 Assistance Needed: +1    Subjective Data  Subjective: I've already been up walking this morning. I am ready to go home today. 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  Communication Communication: No difficulties    Cognition  Overall Cognitive Status: Appears within functional limits for tasks assessed/performed Arousal/Alertness: Awake/alert Orientation Level: Appears intact for tasks assessed Behavior During Session: [facility] for tasks performed    Extremity/Trunk Assessment Right Upper Extremity Assessment RUE ROM/Strength/Tone: [facility] for tasks assessed Left Upper Extremity Assessment LUE ROM/Strength/Tone: [facility] for tasks assessed Right Lower Extremity Assessment RLE ROM/Strength/Tone: [facility] for tasks assessed Left Lower Extremity Assessment LLE ROM/Strength/Tone: WFL for tasks assessed Trunk Assessment Trunk Assessment: Normal   Balance Balance Balance Assessed: Yes Static Standing Balance Static Standing - Balance Support: No upper  extremity supported Static Standing -  Level of Assistance: 7: Independent Static Standing - Comment/# of Minutes: once pt steady he was independent with balance without challenges Dynamic Standing Balance Dynamic Standing - Balance Support: No upper extremity supported Dynamic Standing - Level of Assistance: 5: Stand by assistance Dynamic Standing - Comments: pt able to perform 360 degree turn as well as squat and reach outside BOS fix his bed  End of Session PT - End of Session Equipment Utilized During Treatment: Gait belt Activity Tolerance: Patient tolerated treatment well Patient left: in bed;with call bell/phone within reach Nurse Communication: Mobility status  GP     [facility] [name] 02/08/2012, 9:21 AM

## 2012-02-08 NOTE — Discharge Summary (Signed)
[facility] Discharge Summary   Patient ID:  [name] MRN: [id number] DOB/AGE: 10-22-1934 77 y.o.  Admit date: 02/07/2012 Discharge date: 02/08/2012 Date of Surgery: 02/07/2012 Surgeon: Surgeon(s): Larina Earthly, MD  Admission Diagnosis: ischemic l leg Occlusion Left Femoral- Anterior Tibial Bypass Graft  Discharge Diagnoses:  ischemic l leg Occlusion Left Femoral- Anterior Tibial Bypass Graft  Secondary Diagnoses: Past Medical History  Diagnosis Date  . Hyperlipidemia   . Hypertension   . Gout   . Benign prostatic hypertrophy   . Atrial fibrillation     sees Dr. Verdis Prime  . Chronic systolic dysfunction of left ventricle     a. mixed ischemic and nonischemic CM,  EF 35%. b. s/p AICD implantation.  Marland Kitchen CAD (coronary artery disease)     a. s/p mid LAD stenting with DES 2008  . CHF (congestive heart failure)   . PAD (peripheral artery disease)     Severe by PV angiogram 09/2011  . ED (erectile dysfunction)   . Renal artery stenosis     s/p stenting 2009  . Diabetes mellitus     sees Dr. Everardo All   . Arthritis   . Pacemaker     medtronic  . ICD (implantable cardiac defibrillator) in place     medtronic  . ICD (implantable cardiac defibrillator) in place     due for check in Feb/2013    Procedure(s): BYPASS GRAFT FEMORAL-TIBIAL ARTERY EMBOLECTOMY  Discharged Condition: good  HPI:  [name] is a 77 y.o. male who had a left femoral to AT bypass done by Dr Hart Rochester. F/U Arterial duplex showed possible vein graft stenosis and pt had Arteriorgram by Dr. Myra Gianotti in October. Pt. Called 02/06/12 to report noticeable increased discomfort in left foot over past 2 weeks, and an intermittent stabbing pain in left lower leg that started yesterday. Denies swelling of left foot/leg. States has numbness in left foot, but states due to neuropathy, this is not a change. He was seen in the office today and found to have occluded graft. He is being admitted  for Thrombectomy and revision of left femoral to tibial bypass    Hospital Course:  [name] is a 77 y.o. male is S/P Left Procedure(s): BYPASS GRAFT FEMORAL-TIBIAL ARTERY with RGSV from right leg EMBOLECTOMY Extubated: POD # 0 Post-op wounds healing well Pt. Ambulating, voiding and taking PO diet without difficulty. Pt pain controlled with PO pain meds. Labs as below Complications:none  Consults:     Significant Diagnostic Studies: CBC Lab Results  Component Value Date   WBC 8.5 02/08/2012   HGB 14.8 02/08/2012   HCT 43.4 02/08/2012   MCV 86.1 02/08/2012   PLT 138* 02/08/2012    BMET    Component Value Date/Time   NA 139 02/08/2012 0615   K 5.2* 02/08/2012 0615   CL 105 02/08/2012 0615   CO2 23 02/08/2012 0615   GLUCOSE 209* 02/08/2012 0615   BUN 42* 02/08/2012 0615   CREATININE 1.89* 02/08/2012 0615   CALCIUM 8.3* 02/08/2012 0615   GFRNONAA 33* 02/08/2012 0615   GFRAA 38* 02/08/2012 0615   COAG Lab Results  Component Value Date   INR 1.03 02/07/2012   INR 1.07 09/11/2011   INR 1.10 12/25/2009     Disposition:  Discharge to :Home Discharge Orders    Future Appointments: Provider: Department: Dept Phone: Center:   03/24/2012 9:30 AM [facility] [phone] [facility]   03/24/2012 10:00  AM [facility] [phone] [facility]   03/24/2012 10:30 AM Pryor Ochoa, MD [facility] [phone] [facility]   04/13/2012 1:20 PM [facility] Main Office [place]) [phone] [id number]     Future Orders Please Complete By Expires   Resume previous diet      Driving Restrictions      Comments:   No driving for 3 weeks   Call MD for:  temperature >100.5      Call MD for:  redness, tenderness, or signs of infection (pain, swelling, bleeding, redness, odor or green/yellow discharge around incision site)      Call MD for:  severe or increased  pain, loss or decreased feeling  in affected limb(s)      May shower       Scheduling Instructions:   Sunday   may wash over wound with mild soap and water      Change dressing (specify)      Comments:   Dressing change: as needed      Demarius, Archila  Home Medication Instructions [id number]   Printed on:02/08/12 0920  Medication Information                    insulin aspart protamine-insulin aspart (NOVOLOG 70/30) (70-30) 100 UNIT/ML injection Inject 6-10 Units into the skin 2 (two) times daily with a meal. 10 units with breakfast, and 6 units with the evening meal           vitamin E 1000 UNIT capsule Take 1,000 Units by mouth daily.             finasteride (PROSCAR) 5 MG tablet Take 5 mg by mouth daily.           furosemide (LASIX) 40 MG tablet Take 40 mg by mouth daily.           oxycodone-acetaminophen (PERCOCET) 2.5-325 MG per tablet Take 1 tablet by mouth every 4 (four) hours as needed. For pain           aspirin EC 81 MG tablet Take 1 tablet (81 mg total) by mouth daily.           carvedilol (COREG) 6.25 MG tablet Take 1 tablet (6.25 mg total) by mouth 2 (two) times daily with a meal.           NITROSTAT 0.4 MG SL tablet            pregabalin (LYRICA) 100 MG capsule Take 100 mg by mouth 2 (two) times daily.           oxyCODONE-acetaminophen (PERCOCET/ROXICET) 5-325 MG per tablet Take 1-2 tablets by mouth every 4 (four) hours as needed.            Verbal and written Discharge instructions given to the patient. Wound care per Discharge AVS  F/U 3 weeks with Dr. Hart Rochester Signed: [name] 02/08/2012, 9:20 AM

## 2012-02-08 NOTE — Progress Notes (Signed)
PHARMACIST - PHYSICIAN COMMUNICATION  CONCERNING:  Renal dose adjustment of Antibiotics  DESCRIPTION:  Patient to complete his last dose of IV Zinacef post-op this evening.  His creatinine is 1.89 with an estimated clearance of ~ 34ml/min.  Plan:    Continue current dose.  Will discontinue consult since patient will no longer be on IV antibiotics.  Thank you,  Nadara Mustard, PharmD., MS Clinical Pharmacist Pager:  [phone]

## 2012-02-08 NOTE — Progress Notes (Signed)
Subjective: Interval History: none.. comfortable. Has been up walking in the halls without difficulty  Objective: Vital signs in last 24 hours: Temp:  [96.8 F (36 C)-98 F (36.7 C)] 98 F (36.7 C) (12/07 0800) Pulse Rate:  [69-72] 70  (12/07 0800) Resp:  [14-18] 14  (12/07 0800) BP: (101-193)/(61-106) 101/77 mmHg (12/07 0300) SpO2:  [92 %-100 %] 100 % (12/07 0850) Weight:  [169 lb (76.658 kg)-177 lb 11.1 oz (80.6 kg)] 177 lb 11.1 oz (80.6 kg) (12/06 2300)  Intake/Output from previous day: 12/06 0701 - 12/07 0700 In: 2868.3 [P.O.:570; I.V.:2248.3; IV Piggyback:50] Out: 985 [Urine:910; Blood:75] Intake/Output this shift: Total I/O In: 340 [P.O.:240; I.V.:100] Out: -   Incision/Wound: wound healing nicely with no evidence of erythema. Steri-Strips intact palpable graft pulse and biphasic Doppler signal and anterior tibial and dorsalis pedis foot well-perfused     Lab Results:  Basename 02/08/12 0615 02/07/12 1436  WBC 8.5 --  HGB 14.8 18.4*  HCT 43.4 54.0*  PLT 138* --   BMET  Basename 02/08/12 0615 02/07/12 1436  NA 139 139  K 5.2* 4.7  CL 105 110  CO2 23 --  GLUCOSE 209* 76  BUN 42* 58*  CREATININE 1.89* 2.00*  CALCIUM 8.3* --    Studies/Results: No results found. Anti-infectives: Anti-infectives     Start     Dose/Rate Route Frequency Ordered Stop   02/07/12 2300   cefUROXime (ZINACEF) 1.5 g in dextrose 5 % 50 mL IVPB        1.5 g 100 mL/hr over 30 Minutes Intravenous Every 12 hours 02/07/12 2111 02/08/12 2259   02/07/12 1420   cefUROXime (ZINACEF) 1.5 g in dextrose 5 % 50 mL IVPB        1.5 g 100 mL/hr over 30 Minutes Intravenous 30 min pre-op 02/07/12 1420 02/07/12 1635          Assessment/Plan: s/p Procedure(s) (LRB) with comments: BYPASS GRAFT FEMORAL-TIBIAL ARTERY (Left) - Thrombectomy Left Femoral - Tibial Bypass Graft EMBOLECTOMY (Left) Stable postop day #1. Mobilizing without difficulty. Will discharge to home. Followup with Dr. Hart Rochester  in 2-3 weeks   LOS: 1 day   [name] 02/08/2012, 9:05 AM

## 2012-02-08 NOTE — Progress Notes (Signed)
Pt discharged home per MD order. All discharge instructions were reviewed and all questions answered. Surgical sites looked clean, dry, and intact without signs of infection,

## 2012-02-10 ENCOUNTER — Telehealth: Payer: Self-pay | Admitting: Vascular Surgery

## 2012-02-10 LAB — GLUCOSE, CAPILLARY: Glucose-Capillary: 172 mg/dL — ABNORMAL HIGH (ref 70–99)

## 2012-02-10 NOTE — Telephone Encounter (Addendum)
Message copied by Rosalyn Charters on Mon Feb 10, 2012 11:10 AM ------      Message from: Melene Plan      Created: Mon Feb 10, 2012  9:50 AM       Dr Ilean Skill did the surgery but follow up should be with Dr Kipp Brood??      ----- Message -----         From: [name], PA         Sent: 02/08/2012   9:18 AM           To: Melene Plan, RN            3 week F/U with ABI's - revision left fem AT bypass - Hart Rochester  notified pt.'s wife of fu appt. on 03-03-12 10:30

## 2012-02-11 ENCOUNTER — Encounter (HOSPITAL_COMMUNITY): Payer: Self-pay | Admitting: Vascular Surgery

## 2012-03-03 ENCOUNTER — Ambulatory Visit: Payer: Medicare Other | Admitting: Vascular Surgery

## 2012-03-09 ENCOUNTER — Encounter: Payer: Self-pay | Admitting: Vascular Surgery

## 2012-03-10 ENCOUNTER — Encounter: Payer: Self-pay | Admitting: Vascular Surgery

## 2012-03-10 ENCOUNTER — Encounter (INDEPENDENT_AMBULATORY_CARE_PROVIDER_SITE_OTHER): Payer: Medicare Other | Admitting: *Deleted

## 2012-03-10 ENCOUNTER — Ambulatory Visit (INDEPENDENT_AMBULATORY_CARE_PROVIDER_SITE_OTHER): Payer: Medicare Other | Admitting: Vascular Surgery

## 2012-03-10 VITALS — BP 136/68 | HR 71 | Temp 97.9°F | Ht 68.0 in | Wt 182.0 lb

## 2012-03-10 DIAGNOSIS — I739 Peripheral vascular disease, unspecified: Secondary | ICD-10-CM

## 2012-03-10 DIAGNOSIS — I70219 Atherosclerosis of native arteries of extremities with intermittent claudication, unspecified extremity: Secondary | ICD-10-CM

## 2012-03-10 DIAGNOSIS — Z48812 Encounter for surgical aftercare following surgery on the circulatory system: Secondary | ICD-10-CM

## 2012-03-10 NOTE — Addendum Note (Signed)
Addended by: Dannielle Karvonen on: 03/10/2012 03:43 PM   Modules accepted: Orders

## 2012-03-10 NOTE — Progress Notes (Signed)
Subjective:     Patient ID: [name], male   DOB: [birth year], 78 y.o.   MRN: [id number]  HPI this 78 year old male returns for further followup regarding his left femoral to anterior tibial saphenous vein graft. The graft partially thrombosed distally one month ago and had surgical revision by Dr. early with extension of the saphenous vein graft to a more distal site using a short piece of saphenous vein from the contralateral right leg. Patient has had no claudication symptoms since that time and is doing well.   Review of Systems     Objective:   Physical ExamBP 136/68  Pulse 71  Temp 97.9 F (36.6 C) (Oral)  Ht 5\' 8"  (1.727 m)  Wt 182 lb (82.555 kg)  BMI 27.67 kg/m2  SpO2 98%  General well-developed well-nourished male in no apparent stress alert and oriented x3 Lungs no rhonchi or wheezing Left leg with 3+ pulse is subcutaneous femoral to anterior tibial graft-lateral with 2-3+ pulse and anterior tibial artery at ankle. Saphenous vein harvesting site right leg is healing satisfactorily  ABIs checked today and vessel is noncompressible on left with ABI greater than 1.0 and biphasic flow     Assessment:     Nicely functioning left femoral anterior tibial bypass with saphenous vein-revised one month ago by Dr. Erroll Luna claudication symptoms    Plan:     Return in 3 months with duplex scan of left femoral to anterior tibial bypass graft and see me

## 2012-03-24 ENCOUNTER — Ambulatory Visit: Payer: Medicare Other | Admitting: Vascular Surgery

## 2012-03-31 ENCOUNTER — Telehealth: Payer: Self-pay

## 2012-03-31 ENCOUNTER — Encounter (HOSPITAL_COMMUNITY): Payer: Self-pay

## 2012-03-31 DIAGNOSIS — T82898A Other specified complication of vascular prosthetic devices, implants and grafts, initial encounter: Secondary | ICD-10-CM

## 2012-03-31 NOTE — Progress Notes (Signed)
03/31/12 1901  OBSTRUCTIVE SLEEP APNEA  Have you ever been diagnosed with sleep apnea through a sleep study? No  Do you snore loudly (loud enough to be heard through closed doors)?  1  Do you often feel tired, fatigued, or sleepy during the daytime? 0  Has anyone observed you stop breathing during your sleep? 0  Do you have, or are you being treated for high blood pressure? 1  BMI more than 35 kg/m2? 0  Age over 76 years old? 1  Neck circumference greater than 40 cm/18 inches? 0 (17 1/2 inches)  Gender: 1  Obstructive Sleep Apnea Score 4   Score 4 or greater  Results sent to PCP

## 2012-03-31 NOTE — Telephone Encounter (Addendum)
Rec'd. Phone call from pt.  Stated he was visiting relatives in [place]. And developed pain in left leg x 2 days with walking very short distance.  Went to the ER locally, and was told he had a blockage of his femoral artery.  Was advised to stay in the hospital for treatment, but pt. Chose to come back to [place] to the doctors who are familiar with him.  Stated he was told his "TBI on (L) was 0.55."  Also reported a reading of "1.60" but unsure of what it is associated with.   States has "numbness/tingling of left foot/leg", but stated that he "has had this due to neuropathy."  Pt. Is driving back from [place] @ this time, when he called office to make Dr. Hart Rochester aware.  Discussed pt's symptoms with Dr. Hart Rochester.  Recommended to instruct pt. To report to ER @ [facility] @ 6:30 AM 1/29, NPO after midnight tonight, arrange for Thrombolysis in IR Wednesday, 1/29  AM.  Notified pt. Of the instructions/ Verb. Understanding and agrees.  States he is not on Coumadin, when questioned. Pt. denies rest pain when questioned.

## 2012-04-01 ENCOUNTER — Emergency Department (HOSPITAL_COMMUNITY)
Admission: EM | Admit: 2012-04-01 | Discharge: 2012-04-01 | Discharge status: Against Medical Advice | Payer: Medicare Other

## 2012-04-01 ENCOUNTER — Inpatient Hospital Stay (HOSPITAL_COMMUNITY): Payer: Medicare Other

## 2012-04-01 ENCOUNTER — Encounter (HOSPITAL_COMMUNITY): Payer: Self-pay

## 2012-04-01 ENCOUNTER — Encounter (HOSPITAL_COMMUNITY)
Admission: EM | Disposition: A | Discharge status: Home / Self Care | Payer: Self-pay | Source: Home / Self Care | Attending: Vascular Surgery

## 2012-04-01 ENCOUNTER — Inpatient Hospital Stay (HOSPITAL_COMMUNITY): Admission: RE | Admit: 2012-04-01 | Payer: Medicare Other | Source: Ambulatory Visit | Admitting: Vascular Surgery

## 2012-04-01 ENCOUNTER — Inpatient Hospital Stay (HOSPITAL_COMMUNITY)
Admission: EM | Admit: 2012-04-01 | Discharge: 2012-04-05 | DRG: 253 | Disposition: A | Discharge status: Home / Self Care | Payer: Medicare Other | Attending: Vascular Surgery | Admitting: Vascular Surgery

## 2012-04-01 ENCOUNTER — Emergency Department (HOSPITAL_COMMUNITY): Payer: Medicare Other

## 2012-04-01 DIAGNOSIS — M109 Gout, unspecified: Secondary | ICD-10-CM | POA: Diagnosis present

## 2012-04-01 DIAGNOSIS — Y838 Other surgical procedures as the cause of abnormal reaction of the patient, or of later complication, without mention of misadventure at the time of the procedure: Secondary | ICD-10-CM | POA: Diagnosis present

## 2012-04-01 DIAGNOSIS — E785 Hyperlipidemia, unspecified: Secondary | ICD-10-CM | POA: Diagnosis present

## 2012-04-01 DIAGNOSIS — Y92009 Unspecified place in unspecified non-institutional (private) residence as the place of occurrence of the external cause: Secondary | ICD-10-CM

## 2012-04-01 DIAGNOSIS — N400 Enlarged prostate without lower urinary tract symptoms: Secondary | ICD-10-CM | POA: Diagnosis present

## 2012-04-01 DIAGNOSIS — I509 Heart failure, unspecified: Secondary | ICD-10-CM | POA: Diagnosis present

## 2012-04-01 DIAGNOSIS — I5022 Chronic systolic (congestive) heart failure: Secondary | ICD-10-CM | POA: Diagnosis present

## 2012-04-01 DIAGNOSIS — I129 Hypertensive chronic kidney disease with stage 1 through stage 4 chronic kidney disease, or unspecified chronic kidney disease: Secondary | ICD-10-CM | POA: Diagnosis present

## 2012-04-01 DIAGNOSIS — E119 Type 2 diabetes mellitus without complications: Secondary | ICD-10-CM | POA: Diagnosis present

## 2012-04-01 DIAGNOSIS — N189 Chronic kidney disease, unspecified: Secondary | ICD-10-CM | POA: Diagnosis present

## 2012-04-01 DIAGNOSIS — I428 Other cardiomyopathies: Secondary | ICD-10-CM | POA: Diagnosis present

## 2012-04-01 DIAGNOSIS — D62 Acute posthemorrhagic anemia: Secondary | ICD-10-CM | POA: Diagnosis not present

## 2012-04-01 DIAGNOSIS — Z95810 Presence of automatic (implantable) cardiac defibrillator: Secondary | ICD-10-CM

## 2012-04-01 DIAGNOSIS — T82898A Other specified complication of vascular prosthetic devices, implants and grafts, initial encounter: Principal | ICD-10-CM | POA: Diagnosis present

## 2012-04-01 DIAGNOSIS — I739 Peripheral vascular disease, unspecified: Secondary | ICD-10-CM | POA: Diagnosis present

## 2012-04-01 DIAGNOSIS — I2510 Atherosclerotic heart disease of native coronary artery without angina pectoris: Secondary | ICD-10-CM | POA: Diagnosis present

## 2012-04-01 DIAGNOSIS — I4891 Unspecified atrial fibrillation: Secondary | ICD-10-CM | POA: Diagnosis present

## 2012-04-01 DIAGNOSIS — N179 Acute kidney failure, unspecified: Secondary | ICD-10-CM | POA: Diagnosis present

## 2012-04-01 LAB — CBC
Hemoglobin: 16.4 g/dL (ref 13.0–17.0)
MCH: 30.6 pg (ref 26.0–34.0)
MCH: 30.4 pg (ref 26.0–34.0)
MCHC: 35.5 g/dL (ref 30.0–36.0)
MCHC: 35.5 g/dL (ref 30.0–36.0)
MCV: 86.2 fL (ref 78.0–100.0)
Platelets: 143 10*3/uL — ABNORMAL LOW (ref 150–400)
Platelets: 112 10*3/uL — ABNORMAL LOW (ref 150–400)
RDW: 14.2 % (ref 11.5–15.5)

## 2012-04-01 LAB — URINALYSIS, ROUTINE W REFLEX MICROSCOPIC
Bilirubin Urine: NEGATIVE
Nitrite: NEGATIVE
Specific Gravity, Urine: 1.009 (ref 1.005–1.030)
pH: 5.5 (ref 5.0–8.0)

## 2012-04-01 LAB — GLUCOSE, CAPILLARY
Glucose-Capillary: 86 mg/dL (ref 70–99)
Glucose-Capillary: 109 mg/dL — ABNORMAL HIGH (ref 70–99)

## 2012-04-01 LAB — BASIC METABOLIC PANEL
Calcium: 9.4 mg/dL (ref 8.4–10.5)
Creatinine, Ser: 1.79 mg/dL — ABNORMAL HIGH (ref 0.50–1.35)
GFR calc non Af Amer: 35 mL/min — ABNORMAL LOW (ref >90)
Glucose, Bld: 137 mg/dL — ABNORMAL HIGH (ref 70–99)
Sodium: 140 mEq/L (ref 135–145)

## 2012-04-01 LAB — COMPREHENSIVE METABOLIC PANEL
ALT: 12 U/L (ref 0–53)
AST: 19 U/L (ref 0–37)
Albumin: 3.2 g/dL — ABNORMAL LOW (ref 3.5–5.2)
Calcium: 9 mg/dL (ref 8.4–10.5)
GFR calc Af Amer: 42 mL/min — ABNORMAL LOW (ref >90)
Glucose, Bld: 102 mg/dL — ABNORMAL HIGH (ref 70–99)
Sodium: 138 mEq/L (ref 135–145)
Total Protein: 6 g/dL (ref 6.0–8.3)

## 2012-04-01 LAB — PROTIME-INR: INR: 1.01 (ref 0.00–1.49)

## 2012-04-01 LAB — FIBRINOGEN
Fibrinogen: 256 mg/dL (ref 204–475)
Fibrinogen: 269 mg/dL (ref 204–475)

## 2012-04-01 SURGERY — CREATION, BYPASS, ARTERIAL, FEMORAL TO TIBIAL, USING GRAFT
Anesthesia: General | Site: Leg Lower | Laterality: Left

## 2012-04-01 MED ORDER — OXYCODONE-ACETAMINOPHEN 5-325 MG PO TABS
0.5000 | ORAL_TABLET | ORAL | Status: DC | PRN
  Administered 2012-04-01 – 2012-04-02 (×3): 0.5 via ORAL
  Filled 2012-04-01 (×4): qty 1

## 2012-04-01 MED ORDER — ONDANSETRON HCL 4 MG/2ML IJ SOLN: 4.0000 mg | Freq: Four times a day (QID) | INTRAMUSCULAR | Status: DC | PRN

## 2012-04-01 MED ORDER — ALUM & MAG HYDROXIDE-SIMETH 200-200-20 MG/5ML PO SUSP: 15.0000 mL | ORAL | Status: DC | PRN

## 2012-04-01 MED ORDER — NITROGLYCERIN 0.4 MG SL SUBL: 0.4000 mg | SUBLINGUAL_TABLET | SUBLINGUAL | Status: DC | PRN

## 2012-04-01 MED ORDER — HEPARIN SODIUM (PORCINE) 1000 UNIT/ML IJ SOLN
INTRAMUSCULAR | Status: AC
  Administered 2012-04-01: 3000 [IU] via INTRAVENOUS
  Filled 2012-04-01: qty 1

## 2012-04-01 MED ORDER — CARVEDILOL 6.25 MG PO TABS
6.2500 mg | ORAL_TABLET | Freq: Two times a day (BID) | ORAL | Status: DC
  Administered 2012-04-01 – 2012-04-02 (×3): 6.25 mg via ORAL
  Filled 2012-04-01 (×6): qty 1

## 2012-04-01 MED ORDER — MIDAZOLAM HCL 2 MG/2ML IJ SOLN: 1.0000 mg | INTRAMUSCULAR | Status: DC | PRN

## 2012-04-01 MED ORDER — HEPARIN (PORCINE) IN NACL 100-0.45 UNIT/ML-% IJ SOLN: 800.0000 [IU]/h | INTRAMUSCULAR | Status: DC

## 2012-04-01 MED ORDER — OXYCODONE-ACETAMINOPHEN 2.5-325 MG PO TABS: 1.0000 | ORAL_TABLET | ORAL | Status: DC | PRN

## 2012-04-01 MED ORDER — POTASSIUM CHLORIDE CRYS ER 20 MEQ PO TBCR: 20.0000 meq | EXTENDED_RELEASE_TABLET | Freq: Once | ORAL | Status: DC

## 2012-04-01 MED ORDER — HYDRALAZINE HCL 20 MG/ML IJ SOLN
10.0000 mg | INTRAMUSCULAR | Status: DC | PRN
  Filled 2012-04-01: qty 0.5

## 2012-04-01 MED ORDER — PHENOL 1.4 % MT LIQD: 1.0000 | OROMUCOSAL | Status: DC | PRN

## 2012-04-01 MED ORDER — INSULIN ASPART 100 UNIT/ML ~~LOC~~ SOLN
0.0000 [IU] | SUBCUTANEOUS | Status: DC
  Administered 2012-04-02 (×2): 2 [IU] via SUBCUTANEOUS
  Administered 2012-04-03: 8 [IU] via SUBCUTANEOUS
  Administered 2012-04-04: 5 [IU] via SUBCUTANEOUS
  Administered 2012-04-04: 2 [IU] via SUBCUTANEOUS
  Administered 2012-04-04 (×3): 3 [IU] via SUBCUTANEOUS
  Administered 2012-04-04 – 2012-04-05 (×2): 2 [IU] via SUBCUTANEOUS

## 2012-04-01 MED ORDER — ASPIRIN EC 81 MG PO TBEC
81.0000 mg | DELAYED_RELEASE_TABLET | ORAL | Status: DC
Start: 2012-04-03 — End: 2012-04-05
  Administered 2012-04-03: 81 mg via ORAL
  Filled 2012-04-01: qty 1

## 2012-04-01 MED ORDER — HEPARIN (PORCINE) IN NACL 2-0.9 UNIT/ML-% IJ SOLN
INTRAMUSCULAR | Status: DC
  Administered 2012-04-02: 14:00:00 via INTRAVENOUS
  Filled 2012-04-01 (×3): qty 500

## 2012-04-01 MED ORDER — ONDANSETRON HCL 4 MG/2ML IJ SOLN
4.0000 mg | Freq: Four times a day (QID) | INTRAMUSCULAR | Status: DC | PRN
  Filled 2012-04-01: qty 2

## 2012-04-01 MED ORDER — LABETALOL HCL 5 MG/ML IV SOLN
10.0000 mg | INTRAVENOUS | Status: DC | PRN
  Filled 2012-04-01: qty 4

## 2012-04-01 MED ORDER — METOPROLOL TARTRATE 1 MG/ML IV SOLN: 2.0000 mg | INTRAVENOUS | Status: DC | PRN

## 2012-04-01 MED ORDER — MORPHINE SULFATE 4 MG/ML IJ SOLN: 4.0000 mg | INTRAMUSCULAR | Status: DC | PRN

## 2012-04-01 MED ORDER — FENTANYL CITRATE 0.05 MG/ML IJ SOLN
INTRAMUSCULAR | Status: AC | PRN
  Administered 2012-04-01: 50 ug via INTRAVENOUS
  Administered 2012-04-01 (×2): 25 ug via INTRAVENOUS
  Administered 2012-04-01: 50 ug via INTRAVENOUS

## 2012-04-01 MED ORDER — IOHEXOL 300 MG/ML  SOLN
50.0000 mL | Freq: Once | INTRAMUSCULAR | Status: AC | PRN
  Administered 2012-04-01: 40 mL via INTRAVENOUS

## 2012-04-01 MED ORDER — MIDAZOLAM HCL 2 MG/2ML IJ SOLN
INTRAMUSCULAR | Status: AC | PRN
  Administered 2012-04-01: 2 mg via INTRAVENOUS
  Administered 2012-04-01 (×2): 1 mg via INTRAVENOUS

## 2012-04-01 MED ORDER — INSULIN ASPART PROT & ASPART (70-30 MIX) 100 UNIT/ML ~~LOC~~ SUSP
10.0000 [IU] | Freq: Every day | SUBCUTANEOUS | Status: DC
  Filled 2012-04-01: qty 10

## 2012-04-01 MED ORDER — SODIUM CHLORIDE 0.9 % IJ SOLN
3.0000 mL | Freq: Two times a day (BID) | INTRAMUSCULAR | Status: DC
  Administered 2012-04-01 – 2012-04-02 (×4): 3 mL via INTRAVENOUS

## 2012-04-01 MED ORDER — SODIUM CHLORIDE 0.45 % IV SOLN
INTRAVENOUS | Status: DC
  Administered 2012-04-01 – 2012-04-02 (×2): via INTRAVENOUS

## 2012-04-01 MED ORDER — FUROSEMIDE 40 MG PO TABS
40.0000 mg | ORAL_TABLET | Freq: Every day | ORAL | Status: DC
  Administered 2012-04-01 – 2012-04-05 (×5): 40 mg via ORAL
  Filled 2012-04-01 (×5): qty 1

## 2012-04-01 MED ORDER — GUAIFENESIN-DM 100-10 MG/5ML PO SYRP: 15.0000 mL | ORAL_SOLUTION | ORAL | Status: DC | PRN

## 2012-04-01 MED ORDER — MORPHINE SULFATE 2 MG/ML IJ SOLN
2.0000 mg | INTRAMUSCULAR | Status: DC | PRN
  Administered 2012-04-03 (×3): 4 mg via INTRAVENOUS
  Administered 2012-04-04: 2 mg via INTRAVENOUS
  Filled 2012-04-01: qty 1
  Filled 2012-04-01 (×3): qty 2

## 2012-04-01 MED ORDER — TENECTEPLASE 50 MG IV KIT
0.5000 mg/h | PACK | INTRAVENOUS | Status: DC
  Administered 2012-04-01 – 2012-04-02 (×5): 0.5 mg/h
  Filled 2012-04-01 (×6): qty 0.5

## 2012-04-01 MED ORDER — INSULIN ASPART PROT & ASPART (70-30 MIX) 100 UNIT/ML ~~LOC~~ SUSP
6.0000 [IU] | Freq: Every day | SUBCUTANEOUS | Status: DC
  Administered 2012-04-03: 6 [IU] via SUBCUTANEOUS

## 2012-04-01 MED ORDER — MIDAZOLAM HCL 2 MG/2ML IJ SOLN
INTRAMUSCULAR | Status: AC
  Filled 2012-04-01: qty 4

## 2012-04-01 MED ORDER — UAC/UVC NICU FLUSH (1/4 NS + HEPARIN 0.5 UNIT/ML)
0.5000 mL | INJECTION | INTRAVENOUS | Status: DC | PRN
  Filled 2012-04-01: qty 1.7

## 2012-04-01 MED ORDER — HEPARIN (PORCINE) IN NACL 100-0.45 UNIT/ML-% IJ SOLN
900.0000 [IU]/h | INTRAMUSCULAR | Status: DC
  Administered 2012-04-01: 900 [IU]/h via INTRAVENOUS
  Filled 2012-04-01 (×3): qty 250

## 2012-04-01 MED ORDER — FINASTERIDE 5 MG PO TABS
5.0000 mg | ORAL_TABLET | Freq: Every day | ORAL | Status: DC
  Administered 2012-04-01 – 2012-04-05 (×5): 5 mg via ORAL
  Filled 2012-04-01 (×5): qty 1

## 2012-04-01 MED ORDER — FENTANYL CITRATE 0.05 MG/ML IJ SOLN
INTRAMUSCULAR | Status: AC
  Filled 2012-04-01: qty 4

## 2012-04-01 MED ORDER — SODIUM CHLORIDE 0.9 % IJ SOLN: 3.0000 mL | INTRAMUSCULAR | Status: DC | PRN

## 2012-04-01 MED ORDER — INSULIN ASPART PROT & ASPART (70-30 MIX) 100 UNIT/ML ~~LOC~~ SUSP: 6.0000 [IU] | Freq: Two times a day (BID) | SUBCUTANEOUS | Status: DC

## 2012-04-01 MED ORDER — VITAMIN E 45 MG (100 UNIT) PO CAPS
1000.0000 [IU] | ORAL_CAPSULE | Freq: Every day | ORAL | Status: DC
  Administered 2012-04-01: 1000 [IU] via ORAL
  Filled 2012-04-01 (×5): qty 2

## 2012-04-01 MED ORDER — PANTOPRAZOLE SODIUM 40 MG PO TBEC
40.0000 mg | DELAYED_RELEASE_TABLET | Freq: Every day | ORAL | Status: DC
  Administered 2012-04-01 – 2012-04-05 (×4): 40 mg via ORAL
  Filled 2012-04-01 (×4): qty 1

## 2012-04-01 MED ORDER — SODIUM CHLORIDE 0.9 % IV SOLN: 250.0000 mL | INTRAVENOUS | Status: DC | PRN

## 2012-04-01 NOTE — ED Notes (Signed)
Dr notified of baseline fibrinogen

## 2012-04-01 NOTE — ED Notes (Signed)
Patient presents to [facility] per Dr. [name] with vascular surgery for pre-procedure/pre-op screening. Patient has a known blockage in left femur and has leg pain. Dr. Hart Rochester has been notified of patient's arrival and will be here shortly to see the patient.

## 2012-04-01 NOTE — ED Provider Notes (Signed)
MSE done. Patient here to see Dr. Cyril Mourning due to presumed clot in his left femoral artery, has a history of previous bypass. Dr. Hart Rochester has given orders to triage nurse, and procedure is planned. Patient is otherwise stable, has no current complaints.  Olivia Mackie, MD 04/01/12 [phone]

## 2012-04-01 NOTE — Progress Notes (Signed)
Patient ID: [name], male   DOB: 1934-08-12, 78 y.o.   MRN: [id number] Vascular Surgery Progress Note  Subjective: Occluded left femoral tibial bypass graft  Objective:  Filed Vitals:   04/01/12 1135  BP: 160/83  Pulse: 70  Temp:   Resp:     Patient had angiography performed earlier today by Dr. Archer Asa. I have reviewed the studies. He was able to insert infusion catheter in left femoral-tibial saphenous vein graft which is occluded. Runoff through anterior tibial artery appears unchanged from previous surgery.   Labs:  Lab 04/01/12 0654  CREATININE 1.79*    Lab 04/01/12 0654  NA 140  K 4.6  CL 103  CO2 25  BUN 34*  CREATININE 1.79*  LABGLOM --  GLUCOSE 137*  CALCIUM 9.4    Lab 04/01/12 0654  WBC 9.2  HGB 16.4  HCT 46.2  PLT 143*    Lab 04/01/12 0654  INR 1.01       Imaging: Dg Chest 2 View  04/01/2012  *RADIOLOGY REPORT*  Clinical Data: Blood clot in the leg.  CHEST - 2 VIEW  Comparison: Chest x-ray 08/29/2011.  Findings: Mild persistent elevation of the left hemidiaphragm is unchanged. Lung volumes are normal.  No consolidative airspace disease.  No pleural effusions.  No pneumothorax.  No pulmonary nodule or mass noted.  Pulmonary vasculature and the cardiomediastinal silhouette are within normal limits. Atherosclerosis in the thoracic aorta.  Left-sided pacemaker / AICD in place with lead tips projecting over the expected location of the right atrium, right ventricular apex and overlying the lateral wall of the left ventricle (via the coronary sinus and coronary veins).  IMPRESSION: 1. No radiographic evidence of acute cardiopulmonary disease. 2.  Atherosclerosis.   Original Report Authenticated By: Trudie Reed, M.D.     Assessment/Plan:    LOS: 0 days  s/p Procedure(s): BYPASS GRAFT FEMORAL-TIBIAL ARTERY-thrombosed  Plan-thrombolysis for 24 hours and repeat angiogram in a.m. to see if any lysis of clot in graft occurs and then make  decision regarding possible revision.   [name], MD 04/01/2012 1:45 PM

## 2012-04-01 NOTE — ED Notes (Signed)
Bed flow notified of need for admit bed

## 2012-04-01 NOTE — ED Notes (Signed)
Patient has taken 1/2 dose (5 units) of AM insulin; ok per Dr. Hart Rochester (see nursing communication orders)

## 2012-04-01 NOTE — ED Notes (Signed)
Patient transported to X-ray 

## 2012-04-01 NOTE — H&P (Signed)
Vascular Surgery H&P  Chief Complaint: Occluded left femoral tibial bypass  HPI: [name] is a 78 y.o. male who presents for evaluation of occluded left femoral tibial bypass graft. Patient had left femoral to mid anterior tibial saphenous vein graft in July of 2013 by me. This concluded in December 2013 and was revised distally by Dr. early using saphenous vein from the distal contralateral right leg. He was seen in the office about 4 weeks ago with a patent bypass a palpable dorsalis pedis pulse. He was in [place] in 3 days ago began having severe recurrent claudication in the left calf. He was seen in the emergency department who noted the graft was in fact occluded. They recommended thrombolyzes and anti-coagulation and patient preferred to return here for further treatment. He states that he had no claudication prior to 3 days ago.   Past Medical History  Diagnosis Date  . Hyperlipidemia   . Gout   . Benign prostatic hypertrophy   . Atrial fibrillation     sees Dr. Verdis Prime  . Chronic systolic dysfunction of left ventricle     a. mixed ischemic and nonischemic CM,  EF 35%. b. s/p AICD implantation.  . CHF (congestive heart failure)   . PAD (peripheral artery disease)     Severe by PV angiogram 09/2011  . ED (erectile dysfunction)   . Renal artery stenosis     s/p stenting 2009  . Diabetes mellitus     sees Dr. Everardo All   . Arthritis   . Pacemaker     medtronic  . CAD (coronary artery disease)     a. s/p mid LAD stenting with DES 2008 Dr. Verdis Prime  . Hypertension     sees Dr. Laurice Record  . ICD (implantable cardiac defibrillator) in place     medtronic, Dr. Johney Frame  . ICD (implantable cardiac defibrillator) in place     due for check in Feb/2013  . Sleep apnea     hx of "had surgery for"   Past Surgical History  Procedure Date  . Turp vaporization   . Cardiac defibrillator placement 12/26/09    pacemaker combo  . Cervical epidural injection 2013  . Cardiac  catheterization   . Coronary angioplasty   . Femoral-tibial bypass graft 09/25/2011    Procedure: BYPASS GRAFT FEMORAL-TIBIAL ARTERY;  Surgeon: Pryor Ochoa, MD;  Location: [facility] OR;  Service: Vascular;  Laterality: Left;  Left Femoral - Anterior Tibial Bypass;  saphenous vein graft from left leg  . Intraoperative arteriogram 09/25/2011    Procedure: INTRA OPERATIVE ARTERIOGRAM;  Surgeon: Pryor Ochoa, MD;  Location: [facility] OR;  Service: Vascular;  Laterality: Left;  . Femoral-tibial bypass graft 02/07/2012    Procedure: BYPASS GRAFT FEMORAL-TIBIAL ARTERY;  Surgeon: Larina Earthly, MD;  Location: [facility] OR;  Service: Vascular;  Laterality: Left;  Thrombectomy Left Femoral - Tibial Bypass Graft  . Embolectomy 02/07/2012    Procedure: EMBOLECTOMY;  Surgeon: Larina Earthly, MD;  Location: [facility] OR;  Service: Vascular;  Laterality: Left;  . Uvulopalatopharyngoplasty     "surgery for sleep apnea"   History   Social History  . Marital Status: Widowed    Spouse Name: N/A    Number of Children: N/A  . Years of Education: N/A   Occupational History  . Retired    Social History Main Topics  . Smoking status: Never Smoker   . Smokeless tobacco: Never Used     Comment: 1-2 cigars when  golfing  . Alcohol Use: 1.5 oz/week    3 drink(s) per week  . Drug Use: No  . Sexually Active: None   Other Topics Concern  . None   Social History Narrative   Lives in [place] with significant other,  [name].   Family History  Problem Relation Age of Onset  . Cancer      breast/fhx  . Heart disease      fhx  . Diabetes Neg Hx   . Cancer Father    Allergies  Allergen Reactions  . Other Other (See Comments)    Plastic tape tears skin   Prior to Admission medications   Medication Sig Start Date End Date Taking? Authorizing Provider  Acetaminophen (TYLENOL PO) Take 1-2 tablets by mouth every 6 (six) hours as needed. pain   Yes Historical Provider, MD  aspirin EC 81 MG tablet Take 81 mg by mouth every Monday,  Wednesday, and Friday.   Yes Historical Provider, MD  carvedilol (COREG) 6.25 MG tablet Take 1 tablet (6.25 mg total) by mouth 2 (two) times daily with a meal. 01/23/12  Yes Hillis Range, MD  finasteride (PROSCAR) 5 MG tablet Take 5 mg by mouth daily. 11/01/11  Yes Historical Provider, MD  furosemide (LASIX) 40 MG tablet Take 40 mg by mouth daily.   Yes Historical Provider, MD  insulin aspart protamine-insulin aspart (NOVOLOG 70/30) (70-30) 100 UNIT/ML injection Inject 6-10 Units into the skin 2 (two) times daily with a meal. 10 units with breakfast, and 6 units with the evening meal   Yes Historical Provider, MD  oxycodone-acetaminophen (PERCOCET) 2.5-325 MG per tablet Take 1 tablet by mouth every 4 (four) hours as needed. For pain   Yes Historical Provider, MD  vitamin E 1000 UNIT capsule Take 1,000 Units by mouth daily.    Yes Historical Provider, MD  NITROSTAT 0.4 MG SL tablet Place 0.4 mg under the tongue every 5 (five) minutes as needed. For chest pain, max 3 doses 12/31/11   Historical Provider, MD     Positive ROS: Denies chest pain, dyspnea on exertion, PND, orthopnea, lateralizing weakness, aphasia, and amaurosis fugax, diplopia, blurred vision, syncope, does have history of A. fib but is not on chronic Coumadin. Also has history of coronary artery disease with previous stents by Dr. Verdis Prime.  All other systems have been reviewed and were otherwise negative with the exception of those mentioned in the HPI and as above.  Physical Exam: Filed Vitals:   04/01/12 0705  BP: 89/69  Pulse: 69  Temp: 97.6 F (36.4 C)  Resp: 21    General: Alert, no acute distress HEENT: Normal for age Cardiovascular: Regular rate and rhythm. Carotid pulses 2+, no bruits audible Respiratory: Clear to auscultation. No cyanosis, no use of accessory musculature GI: No organomegaly, abdomen is soft and non-tender Skin: No lesions in the area of chief complaint Neurologic: Sensation intact  distally Psychiatric: Patient is competent for consent with normal mood and affect Musculoskeletal: No obvious deformities Extremities: Left leg with 2-3+ femoral pulse palpable. Subcutaneous lateral vein graft is readily palpable with no pulse. Left foot has motion and sensation but no palpable pulses. No calf tenderness noted. Right leg with 3+ femoral pulse palpable. No distal pulses palpable but foot well-perfused.      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 Likely due to either vein caliber or progressive distal disease  Plan #1 we'll attempt thrombolyzes a vein graft  today by Dr. Archer Asa in IR #2 if unsuccessful we'll attempt thrombectomy and revision #3 Will schedule for surgical revision on Friday of this week pending results of thrombolysis #4 discussed situation with patient and wife. Poor long-term prognosis with recurrent thrombosis within past 6 weeks and graft only been in place for 8 months   [name], MD 04/01/2012 7:56 AM

## 2012-04-01 NOTE — Procedures (Signed)
Interventional Radiology Procedure Note  Procedure:  1.) CO2 and limited IV contrast LLE angiogram 2.) Placement of lysis catheter and initiation of arterial lysis with TNK-TPA Complications: None Recommendations: - Sips and chips only - Q6 hr fibrinogen, INR and CBC - Heparin per pharmacy - Bedrest with leg straight, may elevate HOB 20 deg - Return to IR in am for lysis check  Signed,  Sterling Big, MD Vascular & Interventional Radiologist [facility]

## 2012-04-01 NOTE — H&P (Signed)
[name] is an 78 y.o. male.   Chief Complaint: Left leg pain worked up in July 2013 Left femoral bypass graft performed 09/27/11 Did well til 02/2012; pain gradually increased Bypass graft was revised at that time Has not had any trouble til just 3 days ago Painful left leg; minimal swelling Was seen in ED in [place]; doppler shows left fem graft to be clotted Pt now here to be seen and evaluated by Dr Hart Rochester Now scheduled for left fem bypass graft thrombolysis and possible angioplasty/stent placement HPI: HLD; gout; BPH; A fib; CHF; PAD; RAS; DM; CAD; pacemaker; HTN; ICD  Past Medical History  Diagnosis Date  . Hyperlipidemia   . Gout   . Benign prostatic hypertrophy   . Atrial fibrillation     sees Dr. Verdis Prime  . Chronic systolic dysfunction of left ventricle     a. mixed ischemic and nonischemic CM,  EF 35%. b. s/p AICD implantation.  . CHF (congestive heart failure)   . PAD (peripheral artery disease)     Severe by PV angiogram 09/2011  . ED (erectile dysfunction)   . Renal artery stenosis     s/p stenting 2009  . Diabetes mellitus     sees Dr. Everardo All   . Arthritis   . Pacemaker     medtronic  . CAD (coronary artery disease)     a. s/p mid LAD stenting with DES 2008 Dr. Verdis Prime  . Hypertension     sees Dr. Laurice Record  . ICD (implantable cardiac defibrillator) in place     medtronic, Dr. Johney Frame  . ICD (implantable cardiac defibrillator) in place     due for check in Feb/2013  . Sleep apnea     hx of "had surgery for"    Past Surgical History  Procedure Date  . Turp vaporization   . Cardiac defibrillator placement 12/26/09    pacemaker combo  . Cervical epidural injection 2013  . Cardiac catheterization   . Coronary angioplasty   . Femoral-tibial bypass graft 09/25/2011    Procedure: BYPASS GRAFT FEMORAL-TIBIAL ARTERY;  Surgeon: Pryor Ochoa, MD;  Location: [facility] OR;  Service: Vascular;  Laterality: Left;  Left Femoral - Anterior Tibial Bypass;   saphenous vein graft from left leg  . Intraoperative arteriogram 09/25/2011    Procedure: INTRA OPERATIVE ARTERIOGRAM;  Surgeon: Pryor Ochoa, MD;  Location: [facility] OR;  Service: Vascular;  Laterality: Left;  . Femoral-tibial bypass graft 02/07/2012    Procedure: BYPASS GRAFT FEMORAL-TIBIAL ARTERY;  Surgeon: Larina Earthly, MD;  Location: [facility] OR;  Service: Vascular;  Laterality: Left;  Thrombectomy Left Femoral - Tibial Bypass Graft  . Embolectomy 02/07/2012    Procedure: EMBOLECTOMY;  Surgeon: Larina Earthly, MD;  Location: [facility] OR;  Service: Vascular;  Laterality: Left;  . Uvulopalatopharyngoplasty     "surgery for sleep apnea"    Family History  Problem Relation Age of Onset  . Cancer      breast/fhx  . Heart disease      fhx  . Diabetes Neg Hx   . Cancer Father    Social History:  reports that he has never smoked. He has never used smokeless tobacco. He reports that he drinks about 1.5 ounces of alcohol per week. He reports that he does not use illicit drugs.  Allergies:  Allergies  Allergen Reactions  . Other Other (See Comments)    Plastic tape tears skin     (Not in a hospital  admission)  Results for orders placed during the hospital encounter of 04/01/12 (from the past 48 hour(s))  CBC     Status: Abnormal   Collection Time   04/01/12  6:54 AM      Component Value Range Comment   WBC 9.2  4.0 - 10.5 K/uL    RBC 5.36  4.22 - 5.81 MIL/uL    Hemoglobin 16.4  13.0 - 17.0 g/dL    HCT 47.8  29.5 - 62.1 %    MCV 86.2  78.0 - 100.0 fL    MCH 30.6  26.0 - 34.0 pg    MCHC 35.5  30.0 - 36.0 g/dL    RDW 30.8  65.7 - 84.6 %    Platelets 143 (*) 150 - 400 K/uL   BASIC METABOLIC PANEL     Status: Abnormal   Collection Time   04/01/12  6:54 AM      Component Value Range Comment   Sodium 140  135 - 145 mEq/L    Potassium 4.6  3.5 - 5.1 mEq/L    Chloride 103  96 - 112 mEq/L    CO2 25  19 - 32 mEq/L    Glucose, Bld 137 (*) 70 - 99 mg/dL    BUN 34 (*) 6 - 23 mg/dL    Creatinine, Ser  9.62 (*) 0.50 - 1.35 mg/dL    Calcium 9.4  8.4 - 95.2 mg/dL    GFR calc non Af Amer 35 (*) >90 mL/min    GFR calc Af Amer 41 (*) >90 mL/min   PROTIME-INR     Status: Normal   Collection Time   04/01/12  6:54 AM      Component Value Range Comment   Prothrombin Time 13.2  11.6 - 15.2 seconds    INR 1.01  0.00 - 1.49   APTT     Status: Normal   Collection Time   04/01/12  6:54 AM      Component Value Range Comment   aPTT 27  24 - 37 seconds    Dg Chest 2 View  04/01/2012  *RADIOLOGY REPORT*  Clinical Data: Blood clot in the leg.  CHEST - 2 VIEW  Comparison: Chest x-ray 08/29/2011.  Findings: Mild persistent elevation of the left hemidiaphragm is unchanged. Lung volumes are normal.  No consolidative airspace disease.  No pleural effusions.  No pneumothorax.  No pulmonary nodule or mass noted.  Pulmonary vasculature and the cardiomediastinal silhouette are within normal limits. Atherosclerosis in the thoracic aorta.  Left-sided pacemaker / AICD in place with lead tips projecting over the expected location of the right atrium, right ventricular apex and overlying the lateral wall of the left ventricle (via the coronary sinus and coronary veins).  IMPRESSION: 1. No radiographic evidence of acute cardiopulmonary disease. 2.  Atherosclerosis.   Original Report Authenticated By: Trudie Reed, M.D.     Review of Systems  Constitutional: Negative for fever.       Legs  Respiratory: Negative for cough and shortness of breath.   Cardiovascular: Positive for claudication.       Left leg pain with walking  Gastrointestinal: Negative for nausea and vomiting.  Neurological: Positive for weakness.    Blood pressure 89/69, pulse 69, temperature 97.6 F (36.4 C), temperature source Oral, resp. rate 21, SpO2 99.00%. Physical Exam  Constitutional: He is oriented to person, place, and time. He appears well-developed and well-nourished.  Cardiovascular: Normal rate, regular rhythm and normal heart sounds.  No murmur heard. Respiratory: Effort normal and breath sounds normal. He has no wheezes.  GI: Soft. Bowel sounds are normal. There is no tenderness.  Musculoskeletal: Normal range of motion.       Left leg painful; slight swelling noted; no discoloration  Neurological: He is alert and oriented to person, place, and time. Coordination normal.  Skin: Skin is warm and dry.  Psychiatric: He has a normal mood and affect. His behavior is normal. Judgment and thought content normal.     Assessment/Plan Left femoral bypass graft clotted Hx graft placement 09/2011; revised 02/2012 Claudication now x 3 days Clotted per doppler Pt scheduled now for left fem arteriogram with thrombolysis and poss pta/stent Pt aware of procedure benefits and risks and agreeable to proceed Consent signed and in chart  Lechelle Wrigley A 04/01/2012, 8:32 AM

## 2012-04-01 NOTE — H&P (Signed)
Agree with PA note.    Signed,  Zaide Kardell K. Durrell Barajas, MD Vascular & Interventional Radiologist [facility]  

## 2012-04-01 NOTE — Progress Notes (Signed)
Follow up post procedure. Seen with Dr. Archer Asa Pt resting comfortably in bed.  Denies rest pain. Sheath intact, no bleeding. (L)foot fairly warm, no doppler signals yet. D/w pt plans for repeat eval tmrw.

## 2012-04-01 NOTE — ED Notes (Signed)
MD at bedside. 

## 2012-04-01 NOTE — Progress Notes (Signed)
ANTICOAGULATION CONSULT NOTE - Initial Consult  Pharmacy Consult:  Heparin Indication:  Occluded left femoral bypass graft  Allergies  Allergen Reactions  . Other Other (See Comments)    Plastic tape tears skin    Patient Measurements: Height: 5' 8.11" (173 cm) Weight: 175 lb 0.7 oz (79.4 kg) IBW/kg (Calculated) : 68.65  Heparin Dosing Weight: 79 kg  Vital Signs: Temp: 97.6 F (36.4 C) (01/29 0705) Temp src: Oral (01/29 0705) BP: 160/83 mmHg (01/29 1135) Pulse Rate: 70  (01/29 1135)  Labs:  [facility] 04/01/12 0654  HGB 16.4  HCT 46.2  PLT 143*  APTT 27  LABPROT 13.2  INR 1.01  HEPARINUNFRC --  CREATININE 1.79*  CKTOTAL --  CKMB --  TROPONINI --    Estimated Creatinine Clearance: 34.1 ml/min (by C-G formula based on Cr of 1.79).   Medical History: Past Medical History  Diagnosis Date  . Hyperlipidemia   . Gout   . Benign prostatic hypertrophy   . Atrial fibrillation     sees Dr. Verdis Prime  . Chronic systolic dysfunction of left ventricle     a. mixed ischemic and nonischemic CM,  EF 35%. b. s/p AICD implantation.  . CHF (congestive heart failure)   . PAD (peripheral artery disease)     Severe by PV angiogram 09/2011  . ED (erectile dysfunction)   . Renal artery stenosis     s/p stenting 2009  . Diabetes mellitus     sees Dr. Everardo All   . Arthritis   . Pacemaker     medtronic  . CAD (coronary artery disease)     a. s/p mid LAD stenting with DES 2008 Dr. Verdis Prime  . Hypertension     sees Dr. Laurice Record  . ICD (implantable cardiac defibrillator) in place     medtronic, Dr. Johney Frame  . ICD (implantable cardiac defibrillator) in place     due for check in Feb/2013  . Sleep apnea     hx of "had surgery for"         Assessment: 87 YOM s/p left femoral bypass graft in July 2013, seen in [place] and diagnosed with left femoral graft clot, returning to [facility] today, 04/01/12, for thrombolysis.  Patient continues on tenecteplase infusion and Pharmacy  consulted to manage IV heparin post-op.  No bleeding per RN.    Goal of Therapy:  Heparin level 0.2-0.5 units/ml per MD Monitor platelets by anticoagulation protocol: Yes    Plan:  - Heparin gtt at 900 units/hr, no bolus - Continue tenecteplase 50 ml/hr per MD - Fibrinogen Q6H as ordered by MD - Check 8 hr HL - Daily HL / CBC    Shubh Chiara D. Laney Potash, PharmD, BCPS Pager:  [phone] 04/01/2012, 1:32 PM

## 2012-04-01 NOTE — Progress Notes (Signed)
Utilization Review Completed.Lacy Taglieri T12/11/2012  

## 2012-04-01 NOTE — Progress Notes (Signed)
ANTICOAGULATION CONSULT NOTE  Pharmacy Consult:  Heparin Indication:  Occluded left femoral bypass graft  Allergies  Allergen Reactions  . Other Other (See Comments)    Plastic tape tears skin    Patient Measurements: Height: 5' 8.11" (173 cm) Weight: 175 lb 0.7 oz (79.4 kg) IBW/kg (Calculated) : 68.65  Heparin Dosing Weight: 79 kg  Vital Signs: Temp: 97.8 F (36.6 C) (01/29 2042) Temp src: Oral (01/29 2042) BP: 167/77 mmHg (01/29 2100) Pulse Rate: 70  (01/29 2100)  Labs:  Basename 04/01/12 2210 04/01/12 1327 04/01/12 0654  HGB -- 15.6 16.4  HCT -- 43.9 46.2  PLT -- 112* 143*  APTT -- -- 27  LABPROT -- -- 13.2  INR -- -- 1.01  HEPARINUNFRC 0.37 -- --  CREATININE -- 1.74* 1.79*  CKTOTAL -- -- --  CKMB -- -- --  TROPONINI -- -- --    Estimated Creatinine Clearance: 35.1 ml/min (by C-G formula based on Cr of 1.74).  Assessment: 78 YO Male s/p thrombolysis, on TNKase, for heparin.    Goal of Therapy:  Heparin level 0.2-0.5 units/ml  Monitor platelets by anticoagulation protocol: Yes    Plan:  Continue Heparin at current rate Follow-up am labs.  Geannie Risen, PharmD, BCPS 04/01/2012, 11:46 PM

## 2012-04-02 ENCOUNTER — Encounter (HOSPITAL_COMMUNITY): Payer: Self-pay | Admitting: Anesthesiology

## 2012-04-02 ENCOUNTER — Inpatient Hospital Stay (HOSPITAL_COMMUNITY): Payer: Medicare Other

## 2012-04-02 LAB — FIBRINOGEN
Fibrinogen: 304 mg/dL (ref 204–475)
Fibrinogen: 386 mg/dL (ref 204–475)

## 2012-04-02 LAB — CBC
HCT: 44 % (ref 39.0–52.0)
Hemoglobin: 14.9 g/dL (ref 13.0–17.0)
MCHC: 33.9 g/dL (ref 30.0–36.0)
WBC: 8.1 10*3/uL (ref 4.0–10.5)

## 2012-04-02 LAB — GLUCOSE, CAPILLARY
Glucose-Capillary: 124 mg/dL — ABNORMAL HIGH (ref 70–99)
Glucose-Capillary: 85 mg/dL (ref 70–99)

## 2012-04-02 MED ORDER — IODIXANOL 320 MG/ML IV SOLN
100.0000 mL | Freq: Once | INTRAVENOUS | Status: AC | PRN
  Administered 2012-04-02: 15 mL via INTRAVENOUS

## 2012-04-02 MED ORDER — MIDAZOLAM HCL 2 MG/2ML IJ SOLN
INTRAMUSCULAR | Status: DC | PRN
  Administered 2012-04-02: 1 mg via INTRAVENOUS

## 2012-04-02 MED ORDER — FENTANYL CITRATE 0.05 MG/ML IJ SOLN
INTRAMUSCULAR | Status: DC | PRN
  Administered 2012-04-02: 50 ug via INTRAVENOUS

## 2012-04-02 MED ORDER — HEPARIN (PORCINE) IN NACL 100-0.45 UNIT/ML-% IJ SOLN
850.0000 [IU]/h | INTRAMUSCULAR | Status: DC
  Administered 2012-04-02: 850 [IU]/h via INTRAVENOUS
  Filled 2012-04-02 (×2): qty 250

## 2012-04-02 MED ORDER — DEXTROSE 5 % IV SOLN
1.5000 g | INTRAVENOUS | Status: AC
  Administered 2012-04-03: 1.5 g via INTRAVENOUS
  Filled 2012-04-02: qty 1.5

## 2012-04-02 NOTE — Progress Notes (Signed)
Patient ID: [name], male   DOB: Jan 12, 1935, 78 y.o.   MRN: [id number] Vascular Surgery Progress Note  Subjective: Patient with occluded left femoral to anterior tibial saphenous vein graft-3-4 days ago. From a lysis catheter successfully passed in IR yesterday by Dr. Archer Asa. Has been receiving thrombolytic infusion since 4 PM yesterday. Patient denies any change in symptoms in the left foot.  Objective:  Filed Vitals:   04/02/12 1000  BP: 160/55  Pulse: 70  Temp:   Resp: 22    General alert and oriented x3  left lower extremity which remains pulseless.bypass graft readily palpable in the subcutaneous position laterallyAnd remains pulseless. Left foot pain and adequately perfused with brisk monophasic flow AT  Labs:  Lab 04/01/12 1327 04/01/12 0654  CREATININE 1.74* 1.79*    Lab 04/01/12 1327 04/01/12 0654  NA 138 140  K 5.5* 4.6  CL 105 103  CO2 22 25  BUN 32* 34*  CREATININE 1.74* 1.79*  LABGLOM -- --  GLUCOSE 102* --  CALCIUM 9.0 9.4    Lab 04/02/12 0450 04/01/12 1327 04/01/12 0654  WBC 8.1 7.8 9.2  HGB 14.9 15.6 16.4  HCT 44.0 43.9 46.2  PLT 100* 112* 143*    Lab 04/01/12 0654  INR 1.01    I/O last 3 completed shifts: In: 1278.5 [I.V.:328.5; IV Piggyback:950] Out: 2500 [Urine:2500]  Imaging: Dg Chest 2 View  04/01/2012  *RADIOLOGY REPORT*  Clinical Data: Blood clot in the leg.  CHEST - 2 VIEW  Comparison: Chest x-ray 08/29/2011.  Findings: Mild persistent elevation of the left hemidiaphragm is unchanged. Lung volumes are normal.  No consolidative airspace disease.  No pleural effusions.  No pneumothorax.  No pulmonary nodule or mass noted.  Pulmonary vasculature and the cardiomediastinal silhouette are within normal limits. Atherosclerosis in the thoracic aorta.  Left-sided pacemaker / AICD in place with lead tips projecting over the expected location of the right atrium, right ventricular apex and overlying the lateral wall of the left ventricle  (via the coronary sinus and coronary veins).  IMPRESSION: 1. No radiographic evidence of acute cardiopulmonary disease. 2.  Atherosclerosis.   Original Report Authenticated By: Trudie Reed, M.D.    Ir Angiogram Extremity Left  04/01/2012  *RADIOLOGY REPORT*  IR ULTRASOUND GUIDED VASCULAR ACCESS RIGHT; IR ANGIOGRAM EXTREMITY LEFT; IR INFUSION THROMBOLYSIS ARTERIAL INITIAL  Date: 04/01/2012  Clinical History: 78 year old male with PAD and a history of a left femoral to tibial venous bypass graft status post revision times one with a venous extension.  Approximately 3 days ago he developed acute left lower extremity claudication while on vacation in [place]. He is diagnosed with thrombosis of the venous bypass and return to grains bone for arterial lysis followed by endovascular or open surgical revision.  Procedures Performed: 1. Ultrasound-guided puncture of the right common femoral artery 2.  Catheterization of the abdominal aorta with distal aortic and pelvic CO2 arteriogram 3.  Catheterization of the left external iliac artery 4.  Multi station left lower extremity CO2 arteriogram 5.  Limited intravenous contrast left lower extremity arteriogram 6.  Placement of a multi side-hole infusion catheter throughout the length of the occluded bypass graft 7.  Initiation of arterial thrombolysis  Interventional Radiologist:  Sterling Big, MD  Sedation: Moderate (conscious) sedation was used.  Four mg Versed, 150 mcg Fentanyl were administered intravenously.  The patient's vital signs were monitored continuously by radiology nursing throughout the procedure.  Sedation Time: 84 minutes minutes  Fluoroscopy time:  26.7 minutes  Contrast volume: 40 ml Omnipaque-300  PROCEDURE/FINDINGS:   Informed consent was obtained from the patient following explanation of the procedure, risks, benefits and alternatives. The patient understands, agrees and consents for the procedure. All questions were addressed. A time out was  performed.  Maximal barrier sterile technique utilized including caps, mask, sterile gowns, sterile gloves, large sterile drape, hand hygiene, and betadine skin prep.  The right groin was interrogated with ultrasound.  The common femoral artery was found be widely patent.  There is atherosclerotic vascular calcification along the anterior wall.  A relatively discrete disease free segment was selected.  Local anesthesia was attained by infiltration of 1% lidocaine.  Under direct sonographic guidance, the disease free segment of the common femoral artery was punctured with a 21-gauge micropuncture needle. Images obtained stored for the medical record.  The transitional 5- [place] micro sheath was advanced over a micro catheter positioned in the external iliac artery.  This was then upsized to a 5-French vascular sheath over a Benson wire.  A Sos Omni flush catheter was advanced into the distal aorta and a distal aorta and pelvic arteriogram was performed by hand injection of CO2 gas in bilateral obliquities.  There is diffuse atherosclerotic vascular disease of the aortoiliac system without significant stenosis. The left external iliac artery was then selected with an angled catheter and a multi station left lower extremity CO2 arteriogram was performed.  There is a mild stenosis in the proximal common femoral artery which is not result in hemodynamically significant narrowing.  The profunda femoral artery and its branches appear widely patent.  The native superficial femoral artery and venous bypass graft are occluded.  The above-the-knee popliteal artery reconstitutes via collateral flow from the profunda.  There is moderate disease of the above-the- knee popliteal artery.  The below the knee popliteal artery is patent.  The proximal anterior tibial artery and tibioperoneal trunk are diffusely diseased.  The mid and distal anterior tibial artery are widely patent and disease free.  An additional limited distal left  lower extremity arteriogram was performed with a Visipaque contrast.  The posterior tibial artery has a high-grade stenosis at the origin.  The distal vessel is relatively atretic but patent. Short segment occlusion of the peroneal artery at the origin.  The vessel reconstitutes via collateral flow and is patent into the lower leg although it is atretic in caliber. A small amount of contrast material refluxes into the venous anastomosis of the femoral to tibial bypass graft. The anastomosis appears patent.  A 6-French vascular sheath was then advanced opened over the bifurcation and positioned in the external iliac artery.  Using an angled catheter and glide wire, the native superficial femoral artery was inadvertently cannulated.  The catheter or wire were removed and a superficial artery and used to navigate into the occluded venous bypass graft.  A 135 cm total length, 50 cm infusion length Unifuse multi side-hole infusion catheter was then positioned from just proximal to the origin of the occluded bypass graft to just proximal to the distal anastomosis.  Arterial lysis was begun at 0.5 mg per hour of TNK-TPA.  The sheaths were secured to the groin with 0- silk suture and a sterile dressing.  IMPRESSION:  1.  Combined CO2 and limited intravenous contrast pelvic and left lower extremity arteriogram demonstrates no significant inflow disease.  The native superficial femoral artery and the femoral to tibial venous bypass graft are completely occluded.  The above-the-knee popliteal artery reconstitutes via  collateral flow from the profunda.  The anterior tibial and posterior tibial arteries remain patent although heavily diseased in their proximal segments. The anterior tibial artery is relatively disease free distal to the bypass graft anastomosis.  There is some reflux of contrast material from the anterior tibial artery retrograde into the venous anastomosis of the venous bypass graft in this appears patent.  2.   Successful placement of a multi side-hole infusion catheter throughout the length of the venous bypass graft.  3.  Initiation of arterial thrombolysis.  4. Plan:  The patient will return interventional radiology tomorrow morning for lysis check and repeat left lower extremity arteriogram.  Dr. Hart Rochester would like to review the images with the interventional radiologist to plan for further endovascular versus open surgical revision if needed.  Signed,  Sterling Big, MD Vascular & Interventional Radiologist [facility] Radiology   Original Report Authenticated By: Malachy Moan, M.D.    Ir US Guide Vasc Access Right  04/01/2012  *RADIOLOGY REPORT*  IR ULTRASOUND GUIDED VASCULAR ACCESS RIGHT; IR ANGIOGRAM EXTREMITY LEFT; IR INFUSION THROMBOLYSIS ARTERIAL INITIAL  Date: 04/01/2012  Clinical History: 78 year old male with PAD and a history of a left femoral to tibial venous bypass graft status post revision times one with a venous extension.  Approximately 3 days ago he developed acute left lower extremity claudication while on vacation in [place]. He is diagnosed with thrombosis of the venous bypass and return to grains bone for arterial lysis followed by endovascular or open surgical revision.  Procedures Performed: 1. Ultrasound-guided puncture of the right common femoral artery 2.  Catheterization of the abdominal aorta with distal aortic and pelvic CO2 arteriogram 3.  Catheterization of the left external iliac artery 4.  Multi station left lower extremity CO2 arteriogram 5.  Limited intravenous contrast left lower extremity arteriogram 6.  Placement of a multi side-hole infusion catheter throughout the length of the occluded bypass graft 7.  Initiation of arterial thrombolysis  Interventional Radiologist:  Sterling Big, MD  Sedation: Moderate (conscious) sedation was used.  Four mg Versed, 150 mcg Fentanyl were administered intravenously.  The patient's vital signs were monitored continuously by  radiology nursing throughout the procedure.  Sedation Time: 84 minutes minutes  Fluoroscopy time: 26.7 minutes  Contrast volume: 40 ml Omnipaque-300  PROCEDURE/FINDINGS:   Informed consent was obtained from the patient following explanation of the procedure, risks, benefits and alternatives. The patient understands, agrees and consents for the procedure. All questions were addressed. A time out was performed.  Maximal barrier sterile technique utilized including caps, mask, sterile gowns, sterile gloves, large sterile drape, hand hygiene, and betadine skin prep.  The right groin was interrogated with ultrasound.  The common femoral artery was found be widely patent.  There is atherosclerotic vascular calcification along the anterior wall.  A relatively discrete disease free segment was selected.  Local anesthesia was attained by infiltration of 1% lidocaine.  Under direct sonographic guidance, the disease free segment of the common femoral artery was punctured with a 21-gauge micropuncture needle. Images obtained stored for the medical record.  The transitional 5- [place] micro sheath was advanced over a micro catheter positioned in the external iliac artery.  This was then upsized to a 5-French vascular sheath over a Benson wire.  A Sos Omni flush catheter was advanced into the distal aorta and a distal aorta and pelvic arteriogram was performed by hand injection of CO2 gas in bilateral obliquities.  There is diffuse atherosclerotic vascular disease of  the aortoiliac system without significant stenosis. The left external iliac artery was then selected with an angled catheter and a multi station left lower extremity CO2 arteriogram was performed.  There is a mild stenosis in the proximal common femoral artery which is not result in hemodynamically significant narrowing.  The profunda femoral artery and its branches appear widely patent.  The native superficial femoral artery and venous bypass graft are occluded.  The  above-the-knee popliteal artery reconstitutes via collateral flow from the profunda.  There is moderate disease of the above-the- knee popliteal artery.  The below the knee popliteal artery is patent.  The proximal anterior tibial artery and tibioperoneal trunk are diffusely diseased.  The mid and distal anterior tibial artery are widely patent and disease free.  An additional limited distal left lower extremity arteriogram was performed with a Visipaque contrast.  The posterior tibial artery has a high-grade stenosis at the origin.  The distal vessel is relatively atretic but patent. Short segment occlusion of the peroneal artery at the origin.  The vessel reconstitutes via collateral flow and is patent into the lower leg although it is atretic in caliber. A small amount of contrast material refluxes into the venous anastomosis of the femoral to tibial bypass graft. The anastomosis appears patent.  A 6-French vascular sheath was then advanced opened over the bifurcation and positioned in the external iliac artery.  Using an angled catheter and glide wire, the native superficial femoral artery was inadvertently cannulated.  The catheter or wire were removed and a superficial artery and used to navigate into the occluded venous bypass graft.  A 135 cm total length, 50 cm infusion length Unifuse multi side-hole infusion catheter was then positioned from just proximal to the origin of the occluded bypass graft to just proximal to the distal anastomosis.  Arterial lysis was begun at 0.5 mg per hour of TNK-TPA.  The sheaths were secured to the groin with 0- silk suture and a sterile dressing.  IMPRESSION:  1.  Combined CO2 and limited intravenous contrast pelvic and left lower extremity arteriogram demonstrates no significant inflow disease.  The native superficial femoral artery and the femoral to tibial venous bypass graft are completely occluded.  The above-the-knee popliteal artery reconstitutes via collateral flow  from the profunda.  The anterior tibial and posterior tibial arteries remain patent although heavily diseased in their proximal segments. The anterior tibial artery is relatively disease free distal to the bypass graft anastomosis.  There is some reflux of contrast material from the anterior tibial artery retrograde into the venous anastomosis of the venous bypass graft in this appears patent.  2.  Successful placement of a multi side-hole infusion catheter throughout the length of the venous bypass graft.  3.  Initiation of arterial thrombolysis.  4. Plan:  The patient will return interventional radiology tomorrow morning for lysis check and repeat left lower extremity arteriogram.  Dr. Hart Rochester would like to review the images with the interventional radiologist to plan for further endovascular versus open surgical revision if needed.  Signed,  Sterling Big, MD Vascular & Interventional Radiologist [facility] Radiology   Original Report Authenticated By: Malachy Moan, M.D.    Ir Infusion Thrombol Arterial Initial (ms)  04/01/2012  *RADIOLOGY REPORT*  IR ULTRASOUND GUIDED VASCULAR ACCESS RIGHT; IR ANGIOGRAM EXTREMITY LEFT; IR INFUSION THROMBOLYSIS ARTERIAL INITIAL  Date: 04/01/2012  Clinical History: 78 year old male with PAD and a history of a left femoral to tibial venous bypass graft status post revision times  one with a venous extension.  Approximately 3 days ago he developed acute left lower extremity claudication while on vacation in [place]. He is diagnosed with thrombosis of the venous bypass and return to grains bone for arterial lysis followed by endovascular or open surgical revision.  Procedures Performed: 1. Ultrasound-guided puncture of the right common femoral artery 2.  Catheterization of the abdominal aorta with distal aortic and pelvic CO2 arteriogram 3.  Catheterization of the left external iliac artery 4.  Multi station left lower extremity CO2 arteriogram 5.  Limited intravenous  contrast left lower extremity arteriogram 6.  Placement of a multi side-hole infusion catheter throughout the length of the occluded bypass graft 7.  Initiation of arterial thrombolysis  Interventional Radiologist:  Sterling Big, MD  Sedation: Moderate (conscious) sedation was used.  Four mg Versed, 150 mcg Fentanyl were administered intravenously.  The patient's vital signs were monitored continuously by radiology nursing throughout the procedure.  Sedation Time: 84 minutes minutes  Fluoroscopy time: 26.7 minutes  Contrast volume: 40 ml Omnipaque-300  PROCEDURE/FINDINGS:   Informed consent was obtained from the patient following explanation of the procedure, risks, benefits and alternatives. The patient understands, agrees and consents for the procedure. All questions were addressed. A time out was performed.  Maximal barrier sterile technique utilized including caps, mask, sterile gowns, sterile gloves, large sterile drape, hand hygiene, and betadine skin prep.  The right groin was interrogated with ultrasound.  The common femoral artery was found be widely patent.  There is atherosclerotic vascular calcification along the anterior wall.  A relatively discrete disease free segment was selected.  Local anesthesia was attained by infiltration of 1% lidocaine.  Under direct sonographic guidance, the disease free segment of the common femoral artery was punctured with a 21-gauge micropuncture needle. Images obtained stored for the medical record.  The transitional 5- [place] micro sheath was advanced over a micro catheter positioned in the external iliac artery.  This was then upsized to a 5-French vascular sheath over a Benson wire.  A Sos Omni flush catheter was advanced into the distal aorta and a distal aorta and pelvic arteriogram was performed by hand injection of CO2 gas in bilateral obliquities.  There is diffuse atherosclerotic vascular disease of the aortoiliac system without significant stenosis. The  left external iliac artery was then selected with an angled catheter and a multi station left lower extremity CO2 arteriogram was performed.  There is a mild stenosis in the proximal common femoral artery which is not result in hemodynamically significant narrowing.  The profunda femoral artery and its branches appear widely patent.  The native superficial femoral artery and venous bypass graft are occluded.  The above-the-knee popliteal artery reconstitutes via collateral flow from the profunda.  There is moderate disease of the above-the- knee popliteal artery.  The below the knee popliteal artery is patent.  The proximal anterior tibial artery and tibioperoneal trunk are diffusely diseased.  The mid and distal anterior tibial artery are widely patent and disease free.  An additional limited distal left lower extremity arteriogram was performed with a Visipaque contrast.  The posterior tibial artery has a high-grade stenosis at the origin.  The distal vessel is relatively atretic but patent. Short segment occlusion of the peroneal artery at the origin.  The vessel reconstitutes via collateral flow and is patent into the lower leg although it is atretic in caliber. A small amount of contrast material refluxes into the venous anastomosis of the femoral to tibial bypass graft.  The anastomosis appears patent.  A 6-French vascular sheath was then advanced opened over the bifurcation and positioned in the external iliac artery.  Using an angled catheter and glide wire, the native superficial femoral artery was inadvertently cannulated.  The catheter or wire were removed and a superficial artery and used to navigate into the occluded venous bypass graft.  A 135 cm total length, 50 cm infusion length Unifuse multi side-hole infusion catheter was then positioned from just proximal to the origin of the occluded bypass graft to just proximal to the distal anastomosis.  Arterial lysis was begun at 0.5 mg per hour of TNK-TPA.   The sheaths were secured to the groin with 0- silk suture and a sterile dressing.  IMPRESSION:  1.  Combined CO2 and limited intravenous contrast pelvic and left lower extremity arteriogram demonstrates no significant inflow disease.  The native superficial femoral artery and the femoral to tibial venous bypass graft are completely occluded.  The above-the-knee popliteal artery reconstitutes via collateral flow from the profunda.  The anterior tibial and posterior tibial arteries remain patent although heavily diseased in their proximal segments. The anterior tibial artery is relatively disease free distal to the bypass graft anastomosis.  There is some reflux of contrast material from the anterior tibial artery retrograde into the venous anastomosis of the venous bypass graft in this appears patent.  2.  Successful placement of a multi side-hole infusion catheter throughout the length of the venous bypass graft.  3.  Initiation of arterial thrombolysis.  4. Plan:  The patient will return interventional radiology tomorrow morning for lysis check and repeat left lower extremity arteriogram.  Dr. Hart Rochester would like to review the images with the interventional radiologist to plan for further endovascular versus open surgical revision if needed.  Signed,  Sterling Big, MD Vascular & Interventional Radiologist [facility] Radiology   Original Report Authenticated By: Malachy Moan, M.D.     Assessment/Plan:   LOS: 1 day  s/p Procedure(s): BYPASS GRAFT FEMORAL-TIBIAL ARTERY-thrombosed-attempting thrombolytics   Plan-repeat angiography per hour later today to see if any progress in thrombolyzes of clotted graft Plan probable thrombectomy and/or revision of vein graft tomorrow a.m. at 7:30 using contralateral saphenous vein Discussed this with patient he understands and accepts Also discussed with Dr. Deanne Coffer who will repeat angioplasty later today    [name], MD 04/02/2012 10:40  AM          Patient with occluded left femoral to anterior tibial saphenous vein graft

## 2012-04-02 NOTE — Procedures (Signed)
LLE arteriogram No complication No blood loss. See complete dictation in [facility].

## 2012-04-02 NOTE — Progress Notes (Addendum)
ANTICOAGULATION CONSULT NOTE - FOLLOW UP  Pharmacy Consult:  Heparin Indication:  Occluded left femoral bypass graft  Allergies  Allergen Reactions  . Other Other (See Comments)    Plastic tape tears skin    Patient Measurements: Height: 5' 8.11" (173 cm) Weight: 190 lb 0.6 oz (86.2 kg) IBW/kg (Calculated) : 68.65  Heparin Dosing Weight: 79 kg  Vital Signs: Temp: 97.5 F (36.4 C) (01/30 0314) Temp src: Oral (01/30 0314) BP: 113/82 mmHg (01/30 0800) Pulse Rate: 71  (01/30 0800)  Labs:  Basename 04/02/12 0450 04/01/12 2210 04/01/12 1327 04/01/12 0654  HGB 14.9 -- 15.6 --  HCT 44.0 -- 43.9 46.2  PLT 100* -- 112* 143*  APTT -- -- -- 27  LABPROT -- -- -- 13.2  INR -- -- -- 1.01  HEPARINUNFRC 0.30 0.37 -- --  CREATININE -- -- 1.74* 1.79*  CKTOTAL -- -- -- --  CKMB -- -- -- --  TROPONINI -- -- -- --    Estimated Creatinine Clearance: 38.7 ml/min (by C-G formula based on Cr of 1.74).        Assessment: 55 YOM s/p left femoral bypass graft in July 2013, recently seen in [place] and diagnosed with left femoral graft clot, returning to [facility] on 04/01/12 for thrombolytic therapy.  Patient continues on tenecteplase and heparin infusions without complications.  Heparin level is therapeutic; no bleeding reported.   Goal of Therapy:  Heparin level 0.2-0.5 units/ml per MD Monitor platelets by anticoagulation protocol: Yes    Plan:  - Continue heparin gtt at 900 units/hr - Continue tenecteplase 50 ml/hr per MD - Fibrinogen Q6H as ordered by MD - Daily HL / CBC     Kareem Cathey D. Laney Potash, PharmD, BCPS Pager:  [phone] 04/02/2012, 9:50 AM     =================================================  Addendum: Patient is oozing blood from sheath site per RN.  Noted heparin at 20 ml/hr also infusing through the sheath site.  Heparin level therapeutic this AM.   Plan: - Decrease heparin gtt slightly to 850 units/hr - Continue to monitor for bleeding    Sarah Zerby D. Laney Potash,  PharmD, BCPS Pager:  [phone] 04/02/2012, 2:19 PM

## 2012-04-02 NOTE — Progress Notes (Signed)
Subjective: Pt ok, except c/o back pain from bedrest. Otherwise no new events from LE  Objective: Physical Exam: BP 113/82  Pulse 71  Temp 97.5 F (36.4 C) (Oral)  Resp 20  Ht 5' 8.11" (1.73 m)  Wt 190 lb 0.6 oz (86.2 kg)  BMI 28.80 kg/m2  SpO2 98% Rt groin sheath intact, small amt blood on dressing. (L)LE warm, good cap refill, able to find doppler signal in graft above knee. Fibrinogen 300s overnight and this am.   Labs: CBC  Basename 04/02/12 0450 04/01/12 1327  WBC 8.1 7.8  HGB 14.9 15.6  HCT 44.0 43.9  PLT 100* 112*   BMET  Basename 04/01/12 1327 04/01/12 0654  NA 138 140  K 5.5* 4.6  CL 105 103  CO2 22 25  GLUCOSE 102* 137*  BUN 32* 34*  CREATININE 1.74* 1.79*  CALCIUM 9.0 9.4   LFT  Basename 04/01/12 1327  PROT 6.0  ALBUMIN 3.2*  AST 19  ALT 12  ALKPHOS 48  BILITOT 1.0  BILIDIR --  IBILI --  LIPASE --   PT/INR  Basename 04/01/12 0654  LABPROT 13.2  INR 1.01     Studies/Results: Dg Chest 2 View  04/01/2012  *RADIOLOGY REPORT*  Clinical Data: Blood clot in the leg.  CHEST - 2 VIEW  Comparison: Chest x-ray 08/29/2011.  Findings: Mild persistent elevation of the left hemidiaphragm is unchanged. Lung volumes are normal.  No consolidative airspace disease.  No pleural effusions.  No pneumothorax.  No pulmonary nodule or mass noted.  Pulmonary vasculature and the cardiomediastinal silhouette are within normal limits. Atherosclerosis in the thoracic aorta.  Left-sided pacemaker / AICD in place with lead tips projecting over the expected location of the right atrium, right ventricular apex and overlying the lateral wall of the left ventricle (via the coronary sinus and coronary veins).  IMPRESSION: 1. No radiographic evidence of acute cardiopulmonary disease. 2.  Atherosclerosis.   Original Report Authenticated By: Trudie Reed, M.D.    Ir Angiogram Extremity Left  04/01/2012  *RADIOLOGY REPORT*  IR ULTRASOUND GUIDED VASCULAR ACCESS RIGHT; IR  ANGIOGRAM EXTREMITY LEFT; IR INFUSION THROMBOLYSIS ARTERIAL INITIAL  Date: 04/01/2012  Clinical History: 78 year old male with PAD and a history of a left femoral to tibial venous bypass graft status post revision times one with a venous extension.  Approximately 3 days ago he developed acute left lower extremity claudication while on vacation in [place]. He is diagnosed with thrombosis of the venous bypass and return to grains bone for arterial lysis followed by endovascular or open surgical revision.  Procedures Performed: 1. Ultrasound-guided puncture of the right common femoral artery 2.  Catheterization of the abdominal aorta with distal aortic and pelvic CO2 arteriogram 3.  Catheterization of the left external iliac artery 4.  Multi station left lower extremity CO2 arteriogram 5.  Limited intravenous contrast left lower extremity arteriogram 6.  Placement of a multi side-hole infusion catheter throughout the length of the occluded bypass graft 7.  Initiation of arterial thrombolysis  Interventional Radiologist:  Sterling Big, MD  Sedation: Moderate (conscious) sedation was used.  Four mg Versed, 150 mcg Fentanyl were administered intravenously.  The patient's vital signs were monitored continuously by radiology nursing throughout the procedure.  Sedation Time: 84 minutes minutes  Fluoroscopy time: 26.7 minutes  Contrast volume: 40 ml Omnipaque-300  PROCEDURE/FINDINGS:   Informed consent was obtained from the patient following explanation of the procedure, risks, benefits and alternatives. The patient understands, agrees and  consents for the procedure. All questions were addressed. A time out was performed.  Maximal barrier sterile technique utilized including caps, mask, sterile gowns, sterile gloves, large sterile drape, hand hygiene, and betadine skin prep.  The right groin was interrogated with ultrasound.  The common femoral artery was found be widely patent.  There is atherosclerotic vascular  calcification along the anterior wall.  A relatively discrete disease free segment was selected.  Local anesthesia was attained by infiltration of 1% lidocaine.  Under direct sonographic guidance, the disease free segment of the common femoral artery was punctured with a 21-gauge micropuncture needle. Images obtained stored for the medical record.  The transitional 5- [place] micro sheath was advanced over a micro catheter positioned in the external iliac artery.  This was then upsized to a 5-French vascular sheath over a Benson wire.  A Sos Omni flush catheter was advanced into the distal aorta and a distal aorta and pelvic arteriogram was performed by hand injection of CO2 gas in bilateral obliquities.  There is diffuse atherosclerotic vascular disease of the aortoiliac system without significant stenosis. The left external iliac artery was then selected with an angled catheter and a multi station left lower extremity CO2 arteriogram was performed.  There is a mild stenosis in the proximal common femoral artery which is not result in hemodynamically significant narrowing.  The profunda femoral artery and its branches appear widely patent.  The native superficial femoral artery and venous bypass graft are occluded.  The above-the-knee popliteal artery reconstitutes via collateral flow from the profunda.  There is moderate disease of the above-the- knee popliteal artery.  The below the knee popliteal artery is patent.  The proximal anterior tibial artery and tibioperoneal trunk are diffusely diseased.  The mid and distal anterior tibial artery are widely patent and disease free.  An additional limited distal left lower extremity arteriogram was performed with a Visipaque contrast.  The posterior tibial artery has a high-grade stenosis at the origin.  The distal vessel is relatively atretic but patent. Short segment occlusion of the peroneal artery at the origin.  The vessel reconstitutes via collateral flow and is  patent into the lower leg although it is atretic in caliber. A small amount of contrast material refluxes into the venous anastomosis of the femoral to tibial bypass graft. The anastomosis appears patent.  A 6-French vascular sheath was then advanced opened over the bifurcation and positioned in the external iliac artery.  Using an angled catheter and glide wire, the native superficial femoral artery was inadvertently cannulated.  The catheter or wire were removed and a superficial artery and used to navigate into the occluded venous bypass graft.  A 135 cm total length, 50 cm infusion length Unifuse multi side-hole infusion catheter was then positioned from just proximal to the origin of the occluded bypass graft to just proximal to the distal anastomosis.  Arterial lysis was begun at 0.5 mg per hour of TNK-TPA.  The sheaths were secured to the groin with 0- silk suture and a sterile dressing.  IMPRESSION:  1.  Combined CO2 and limited intravenous contrast pelvic and left lower extremity arteriogram demonstrates no significant inflow disease.  The native superficial femoral artery and the femoral to tibial venous bypass graft are completely occluded.  The above-the-knee popliteal artery reconstitutes via collateral flow from the profunda.  The anterior tibial and posterior tibial arteries remain patent although heavily diseased in their proximal segments. The anterior tibial artery is relatively disease free distal to the  bypass graft anastomosis.  There is some reflux of contrast material from the anterior tibial artery retrograde into the venous anastomosis of the venous bypass graft in this appears patent.  2.  Successful placement of a multi side-hole infusion catheter throughout the length of the venous bypass graft.  3.  Initiation of arterial thrombolysis.  4. Plan:  The patient will return interventional radiology tomorrow morning for lysis check and repeat left lower extremity arteriogram.  Dr. Hart Rochester  would like to review the images with the interventional radiologist to plan for further endovascular versus open surgical revision if needed.  Signed,  Sterling Big, MD Vascular & Interventional Radiologist [facility]   Original Report Authenticated By: Malachy Moan, M.D.    Ir US Guide Vasc Access Right  04/01/2012  *RADIOLOGY REPORT*  IR ULTRASOUND GUIDED VASCULAR ACCESS RIGHT; IR ANGIOGRAM EXTREMITY LEFT; IR INFUSION THROMBOLYSIS ARTERIAL INITIAL  Date: 04/01/2012  Clinical History: 78 year old male with PAD and a history of a left femoral to tibial venous bypass graft status post revision times one with a venous extension.  Approximately 3 days ago he developed acute left lower extremity claudication while on vacation in [place]. He is diagnosed with thrombosis of the venous bypass and return to grains bone for arterial lysis followed by endovascular or open surgical revision.  Procedures Performed: 1. Ultrasound-guided puncture of the right common femoral artery 2.  Catheterization of the abdominal aorta with distal aortic and pelvic CO2 arteriogram 3.  Catheterization of the left external iliac artery 4.  Multi station left lower extremity CO2 arteriogram 5.  Limited intravenous contrast left lower extremity arteriogram 6.  Placement of a multi side-hole infusion catheter throughout the length of the occluded bypass graft 7.  Initiation of arterial thrombolysis  Interventional Radiologist:  Sterling Big, MD  Sedation: Moderate (conscious) sedation was used.  Four mg Versed, 150 mcg Fentanyl were administered intravenously.  The patient's vital signs were monitored continuously by radiology nursing throughout the procedure.  Sedation Time: 84 minutes minutes  Fluoroscopy time: 26.7 minutes  Contrast volume: 40 ml Omnipaque-300  PROCEDURE/FINDINGS:   Informed consent was obtained from the patient following explanation of the procedure, risks, benefits and alternatives. The patient  understands, agrees and consents for the procedure. All questions were addressed. A time out was performed.  Maximal barrier sterile technique utilized including caps, mask, sterile gowns, sterile gloves, large sterile drape, hand hygiene, and betadine skin prep.  The right groin was interrogated with ultrasound.  The common femoral artery was found be widely patent.  There is atherosclerotic vascular calcification along the anterior wall.  A relatively discrete disease free segment was selected.  Local anesthesia was attained by infiltration of 1% lidocaine.  Under direct sonographic guidance, the disease free segment of the common femoral artery was punctured with a 21-gauge micropuncture needle. Images obtained stored for the medical record.  The transitional 5- [place] micro sheath was advanced over a micro catheter positioned in the external iliac artery.  This was then upsized to a 5-French vascular sheath over a Benson wire.  A Sos Omni flush catheter was advanced into the distal aorta and a distal aorta and pelvic arteriogram was performed by hand injection of CO2 gas in bilateral obliquities.  There is diffuse atherosclerotic vascular disease of the aortoiliac system without significant stenosis. The left external iliac artery was then selected with an angled catheter and a multi station left lower extremity CO2 arteriogram was performed.  There is a  mild stenosis in the proximal common femoral artery which is not result in hemodynamically significant narrowing.  The profunda femoral artery and its branches appear widely patent.  The native superficial femoral artery and venous bypass graft are occluded.  The above-the-knee popliteal artery reconstitutes via collateral flow from the profunda.  There is moderate disease of the above-the- knee popliteal artery.  The below the knee popliteal artery is patent.  The proximal anterior tibial artery and tibioperoneal trunk are diffusely diseased.  The mid and  distal anterior tibial artery are widely patent and disease free.  An additional limited distal left lower extremity arteriogram was performed with a Visipaque contrast.  The posterior tibial artery has a high-grade stenosis at the origin.  The distal vessel is relatively atretic but patent. Short segment occlusion of the peroneal artery at the origin.  The vessel reconstitutes via collateral flow and is patent into the lower leg although it is atretic in caliber. A small amount of contrast material refluxes into the venous anastomosis of the femoral to tibial bypass graft. The anastomosis appears patent.  A 6-French vascular sheath was then advanced opened over the bifurcation and positioned in the external iliac artery.  Using an angled catheter and glide wire, the native superficial femoral artery was inadvertently cannulated.  The catheter or wire were removed and a superficial artery and used to navigate into the occluded venous bypass graft.  A 135 cm total length, 50 cm infusion length Unifuse multi side-hole infusion catheter was then positioned from just proximal to the origin of the occluded bypass graft to just proximal to the distal anastomosis.  Arterial lysis was begun at 0.5 mg per hour of TNK-TPA.  The sheaths were secured to the groin with 0- silk suture and a sterile dressing.  IMPRESSION:  1.  Combined CO2 and limited intravenous contrast pelvic and left lower extremity arteriogram demonstrates no significant inflow disease.  The native superficial femoral artery and the femoral to tibial venous bypass graft are completely occluded.  The above-the-knee popliteal artery reconstitutes via collateral flow from the profunda.  The anterior tibial and posterior tibial arteries remain patent although heavily diseased in their proximal segments. The anterior tibial artery is relatively disease free distal to the bypass graft anastomosis.  There is some reflux of contrast material from the anterior tibial  artery retrograde into the venous anastomosis of the venous bypass graft in this appears patent.  2.  Successful placement of a multi side-hole infusion catheter throughout the length of the venous bypass graft.  3.  Initiation of arterial thrombolysis.  4. Plan:  The patient will return interventional radiology tomorrow morning for lysis check and repeat left lower extremity arteriogram.  Dr. Hart Rochester would like to review the images with the interventional radiologist to plan for further endovascular versus open surgical revision if needed.  Signed,  Sterling Big, MD Vascular & Interventional Radiologist [facility]   Original Report Authenticated By: Malachy Moan, M.D.    Ir Infusion Thrombol Arterial Initial (ms)  04/01/2012  *RADIOLOGY REPORT*  IR ULTRASOUND GUIDED VASCULAR ACCESS RIGHT; IR ANGIOGRAM EXTREMITY LEFT; IR INFUSION THROMBOLYSIS ARTERIAL INITIAL  Date: 04/01/2012  Clinical History: 78 year old male with PAD and a history of a left femoral to tibial venous bypass graft status post revision times one with a venous extension.  Approximately 3 days ago he developed acute left lower extremity claudication while on vacation in [place]. He is diagnosed with thrombosis of the venous bypass and return  to grains bone for arterial lysis followed by endovascular or open surgical revision.  Procedures Performed: 1. Ultrasound-guided puncture of the right common femoral artery 2.  Catheterization of the abdominal aorta with distal aortic and pelvic CO2 arteriogram 3.  Catheterization of the left external iliac artery 4.  Multi station left lower extremity CO2 arteriogram 5.  Limited intravenous contrast left lower extremity arteriogram 6.  Placement of a multi side-hole infusion catheter throughout the length of the occluded bypass graft 7.  Initiation of arterial thrombolysis  Interventional Radiologist:  Sterling Big, MD  Sedation: Moderate (conscious) sedation was used.  Four mg  Versed, 150 mcg Fentanyl were administered intravenously.  The patient's vital signs were monitored continuously by radiology nursing throughout the procedure.  Sedation Time: 84 minutes minutes  Fluoroscopy time: 26.7 minutes  Contrast volume: 40 ml Omnipaque-300  PROCEDURE/FINDINGS:   Informed consent was obtained from the patient following explanation of the procedure, risks, benefits and alternatives. The patient understands, agrees and consents for the procedure. All questions were addressed. A time out was performed.  Maximal barrier sterile technique utilized including caps, mask, sterile gowns, sterile gloves, large sterile drape, hand hygiene, and betadine skin prep.  The right groin was interrogated with ultrasound.  The common femoral artery was found be widely patent.  There is atherosclerotic vascular calcification along the anterior wall.  A relatively discrete disease free segment was selected.  Local anesthesia was attained by infiltration of 1% lidocaine.  Under direct sonographic guidance, the disease free segment of the common femoral artery was punctured with a 21-gauge micropuncture needle. Images obtained stored for the medical record.  The transitional 5- [place] micro sheath was advanced over a micro catheter positioned in the external iliac artery.  This was then upsized to a 5-French vascular sheath over a Benson wire.  A Sos Omni flush catheter was advanced into the distal aorta and a distal aorta and pelvic arteriogram was performed by hand injection of CO2 gas in bilateral obliquities.  There is diffuse atherosclerotic vascular disease of the aortoiliac system without significant stenosis. The left external iliac artery was then selected with an angled catheter and a multi station left lower extremity CO2 arteriogram was performed.  There is a mild stenosis in the proximal common femoral artery which is not result in hemodynamically significant narrowing.  The profunda femoral artery and  its branches appear widely patent.  The native superficial femoral artery and venous bypass graft are occluded.  The above-the-knee popliteal artery reconstitutes via collateral flow from the profunda.  There is moderate disease of the above-the- knee popliteal artery.  The below the knee popliteal artery is patent.  The proximal anterior tibial artery and tibioperoneal trunk are diffusely diseased.  The mid and distal anterior tibial artery are widely patent and disease free.  An additional limited distal left lower extremity arteriogram was performed with a Visipaque contrast.  The posterior tibial artery has a high-grade stenosis at the origin.  The distal vessel is relatively atretic but patent. Short segment occlusion of the peroneal artery at the origin.  The vessel reconstitutes via collateral flow and is patent into the lower leg although it is atretic in caliber. A small amount of contrast material refluxes into the venous anastomosis of the femoral to tibial bypass graft. The anastomosis appears patent.  A 6-French vascular sheath was then advanced opened over the bifurcation and positioned in the external iliac artery.  Using an angled catheter and glide wire, the native  superficial femoral artery was inadvertently cannulated.  The catheter or wire were removed and a superficial artery and used to navigate into the occluded venous bypass graft.  A 135 cm total length, 50 cm infusion length Unifuse multi side-hole infusion catheter was then positioned from just proximal to the origin of the occluded bypass graft to just proximal to the distal anastomosis.  Arterial lysis was begun at 0.5 mg per hour of TNK-TPA.  The sheaths were secured to the groin with 0- silk suture and a sterile dressing.  IMPRESSION:  1.  Combined CO2 and limited intravenous contrast pelvic and left lower extremity arteriogram demonstrates no significant inflow disease.  The native superficial femoral artery and the femoral to tibial  venous bypass graft are completely occluded.  The above-the-knee popliteal artery reconstitutes via collateral flow from the profunda.  The anterior tibial and posterior tibial arteries remain patent although heavily diseased in their proximal segments. The anterior tibial artery is relatively disease free distal to the bypass graft anastomosis.  There is some reflux of contrast material from the anterior tibial artery retrograde into the venous anastomosis of the venous bypass graft in this appears patent.  2.  Successful placement of a multi side-hole infusion catheter throughout the length of the venous bypass graft.  3.  Initiation of arterial thrombolysis.  4. Plan:  The patient will return interventional radiology tomorrow morning for lysis check and repeat left lower extremity arteriogram.  Dr. Hart Rochester would like to review the images with the interventional radiologist to plan for further endovascular versus open surgical revision if needed.  Signed,  Sterling Big, MD Vascular & Interventional Radiologist [facility]   Original Report Authenticated By: Malachy Moan, M.D.     Assessment/Plan: Thrombosed (L)fem-tib, s/p initiation of lysis 1/29 Cont infusion this am. Plan for repeat angio later today. Will d/w Dr. Hart Rochester.    LOS: 1 day    Brayton El PA-C 04/02/2012 9:37 AM

## 2012-04-03 ENCOUNTER — Inpatient Hospital Stay (HOSPITAL_COMMUNITY): Payer: Medicare Other | Admitting: Anesthesiology

## 2012-04-03 ENCOUNTER — Encounter (HOSPITAL_COMMUNITY): Payer: Self-pay | Admitting: Certified Registered Nurse Anesthetist

## 2012-04-03 ENCOUNTER — Encounter (HOSPITAL_COMMUNITY): Payer: Self-pay | Admitting: Anesthesiology

## 2012-04-03 ENCOUNTER — Telehealth: Payer: Self-pay | Admitting: Surgery

## 2012-04-03 ENCOUNTER — Encounter (HOSPITAL_COMMUNITY)
Admission: EM | Disposition: A | Discharge status: Home / Self Care | Payer: Self-pay | Source: Home / Self Care | Attending: Vascular Surgery

## 2012-04-03 DIAGNOSIS — I70219 Atherosclerosis of native arteries of extremities with intermittent claudication, unspecified extremity: Secondary | ICD-10-CM

## 2012-04-03 HISTORY — PX: FEMORAL-TIBIAL BYPASS GRAFT: SHX938

## 2012-04-03 LAB — CBC
HCT: 42.5 % (ref 39.0–52.0)
Hemoglobin: 15 g/dL (ref 13.0–17.0)
MCH: 29.8 pg (ref 26.0–34.0)
MCHC: 35.3 g/dL (ref 30.0–36.0)
RDW: 13.9 % (ref 11.5–15.5)

## 2012-04-03 LAB — BASIC METABOLIC PANEL
BUN: 30 mg/dL — ABNORMAL HIGH (ref 6–23)
Calcium: 8.3 mg/dL — ABNORMAL LOW (ref 8.4–10.5)
GFR calc Af Amer: 37 mL/min — ABNORMAL LOW (ref >90)
GFR calc non Af Amer: 32 mL/min — ABNORMAL LOW (ref >90)
Glucose, Bld: 151 mg/dL — ABNORMAL HIGH (ref 70–99)
Sodium: 137 mEq/L (ref 135–145)

## 2012-04-03 LAB — GLUCOSE, CAPILLARY
Glucose-Capillary: 193 mg/dL — ABNORMAL HIGH (ref 70–99)
Glucose-Capillary: 264 mg/dL — ABNORMAL HIGH (ref 70–99)

## 2012-04-03 LAB — HEPARIN LEVEL (UNFRACTIONATED): Heparin Unfractionated: 0.38 IU/mL (ref 0.30–0.70)

## 2012-04-03 SURGERY — CREATION, BYPASS, ARTERIAL, FEMORAL TO TIBIAL, USING GRAFT
Anesthesia: General | Site: Leg Upper | Laterality: Right | Wound class: Clean

## 2012-04-03 MED ORDER — GLYCOPYRROLATE 0.2 MG/ML IJ SOLN
INTRAMUSCULAR | Status: DC | PRN
  Administered 2012-04-03: 0.6 mg via INTRAVENOUS

## 2012-04-03 MED ORDER — FENTANYL CITRATE 0.05 MG/ML IJ SOLN
INTRAMUSCULAR | Status: DC | PRN
  Administered 2012-04-03 (×3): 50 ug via INTRAVENOUS
  Administered 2012-04-03: 100 ug via INTRAVENOUS

## 2012-04-03 MED ORDER — NEOSTIGMINE METHYLSULFATE 1 MG/ML IJ SOLN
INTRAMUSCULAR | Status: DC | PRN
  Administered 2012-04-03: 4 mg via INTRAVENOUS

## 2012-04-03 MED ORDER — OXYCODONE-ACETAMINOPHEN 2.5-325 MG PO TABS: 1.0000 | ORAL_TABLET | ORAL | Status: DC | PRN

## 2012-04-03 MED ORDER — LACTATED RINGERS IV SOLN
INTRAVENOUS | Status: DC | PRN
  Administered 2012-04-03: 08:00:00 via INTRAVENOUS

## 2012-04-03 MED ORDER — ACETAMINOPHEN 325 MG PO TABS
325.0000 mg | ORAL_TABLET | ORAL | Status: DC | PRN
Start: 2012-04-03 — End: 2012-04-05

## 2012-04-03 MED ORDER — DOCUSATE SODIUM 100 MG PO CAPS
100.0000 mg | ORAL_CAPSULE | Freq: Every day | ORAL | Status: DC
  Administered 2012-04-04 – 2012-04-05 (×2): 100 mg via ORAL
  Filled 2012-04-03 (×2): qty 1

## 2012-04-03 MED ORDER — CARVEDILOL 6.25 MG PO TABS
6.2500 mg | ORAL_TABLET | Freq: Two times a day (BID) | ORAL | Status: DC
  Administered 2012-04-04 – 2012-04-05 (×2): 6.25 mg via ORAL
  Filled 2012-04-03 (×6): qty 1

## 2012-04-03 MED ORDER — HEPARIN SODIUM (PORCINE) 1000 UNIT/ML IJ SOLN
INTRAMUSCULAR | Status: DC | PRN
  Administered 2012-04-03: 6000 [IU] via INTRAVENOUS

## 2012-04-03 MED ORDER — SODIUM CHLORIDE 0.9 % IV SOLN
10.0000 mg | INTRAVENOUS | Status: DC | PRN
  Administered 2012-04-03: 25 ug/min via INTRAVENOUS

## 2012-04-03 MED ORDER — IOHEXOL 300 MG/ML  SOLN
INTRAMUSCULAR | Status: DC | PRN
  Administered 2012-04-03: 50 mL via INTRA_ARTERIAL

## 2012-04-03 MED ORDER — PHENYLEPHRINE HCL 10 MG/ML IJ SOLN
INTRAMUSCULAR | Status: DC | PRN
  Administered 2012-04-03 (×2): 40 ug via INTRAVENOUS

## 2012-04-03 MED ORDER — SODIUM CHLORIDE 0.9 % IV SOLN: 500.0000 mL | Freq: Once | INTRAVENOUS | Status: AC | PRN

## 2012-04-03 MED ORDER — HYDROMORPHONE HCL PF 1 MG/ML IJ SOLN
INTRAMUSCULAR | Status: AC
  Filled 2012-04-03: qty 1

## 2012-04-03 MED ORDER — PROPOFOL 10 MG/ML IV BOLUS
INTRAVENOUS | Status: DC | PRN
  Administered 2012-04-03: 150 mg via INTRAVENOUS

## 2012-04-03 MED ORDER — TEMAZEPAM 15 MG PO CAPS: 15.0000 mg | ORAL_CAPSULE | Freq: Every evening | ORAL | Status: DC | PRN

## 2012-04-03 MED ORDER — ONDANSETRON HCL 4 MG/2ML IJ SOLN
4.0000 mg | Freq: Once | INTRAMUSCULAR | Status: AC | PRN
  Administered 2012-04-03: 4 mg via INTRAVENOUS

## 2012-04-03 MED ORDER — ACETAMINOPHEN 650 MG RE SUPP
325.0000 mg | RECTAL | Status: DC | PRN
Start: 2012-04-03 — End: 2012-04-05

## 2012-04-03 MED ORDER — WARFARIN - PHYSICIAN DOSING INPATIENT: Freq: Every day | Status: DC

## 2012-04-03 MED ORDER — ONDANSETRON HCL 4 MG/2ML IJ SOLN
INTRAMUSCULAR | Status: AC
  Administered 2012-04-03: 4 mg
  Filled 2012-04-03: qty 2

## 2012-04-03 MED ORDER — HEPARIN (PORCINE) IN NACL 100-0.45 UNIT/ML-% IJ SOLN
600.0000 [IU]/h | INTRAMUSCULAR | Status: DC
  Administered 2012-04-03: 600 [IU]/h via INTRAVENOUS
  Filled 2012-04-03: qty 250

## 2012-04-03 MED ORDER — 0.9 % SODIUM CHLORIDE (POUR BTL) OPTIME
TOPICAL | Status: DC | PRN
  Administered 2012-04-03: 2000 mL

## 2012-04-03 MED ORDER — SODIUM CHLORIDE 0.9 % IV SOLN
INTRAVENOUS | Status: DC
  Administered 2012-04-03 – 2012-04-04 (×2): via INTRAVENOUS

## 2012-04-03 MED ORDER — ARTIFICIAL TEARS OP OINT
TOPICAL_OINTMENT | OPHTHALMIC | Status: DC | PRN
  Administered 2012-04-03: 1 via OPHTHALMIC

## 2012-04-03 MED ORDER — ROCURONIUM BROMIDE 100 MG/10ML IV SOLN
INTRAVENOUS | Status: DC | PRN
  Administered 2012-04-03: 20 mg via INTRAVENOUS
  Administered 2012-04-03: 10 mg via INTRAVENOUS
  Administered 2012-04-03: 50 mg via INTRAVENOUS

## 2012-04-03 MED ORDER — SODIUM CHLORIDE 0.9 % IR SOLN
Status: DC | PRN
  Administered 2012-04-03: 08:00:00

## 2012-04-03 MED ORDER — WARFARIN SODIUM 10 MG PO TABS
10.0000 mg | ORAL_TABLET | Freq: Once | ORAL | Status: AC
  Administered 2012-04-03: 10 mg via ORAL
  Filled 2012-04-03 (×2): qty 1

## 2012-04-03 MED ORDER — ONDANSETRON HCL 4 MG/2ML IJ SOLN
INTRAMUSCULAR | Status: DC | PRN
  Administered 2012-04-03: 4 mg via INTRAVENOUS

## 2012-04-03 MED ORDER — LIDOCAINE HCL (CARDIAC) 20 MG/ML IV SOLN
INTRAVENOUS | Status: DC | PRN
  Administered 2012-04-03: 100 mg via INTRAVENOUS

## 2012-04-03 MED ORDER — HYDROMORPHONE HCL PF 1 MG/ML IJ SOLN
INTRAMUSCULAR | Status: DC | PRN
  Administered 2012-04-03 (×2): 0.5 mg via INTRAVENOUS

## 2012-04-03 MED ORDER — DEXTROSE 5 % IV SOLN
1.5000 g | Freq: Two times a day (BID) | INTRAVENOUS | Status: AC
  Administered 2012-04-03 – 2012-04-04 (×2): 1.5 g via INTRAVENOUS
  Filled 2012-04-03 (×3): qty 1.5

## 2012-04-03 MED ORDER — DOPAMINE-DEXTROSE 3.2-5 MG/ML-% IV SOLN: 3.0000 ug/kg/min | INTRAVENOUS | Status: DC

## 2012-04-03 MED ORDER — EPHEDRINE SULFATE 50 MG/ML IJ SOLN
INTRAMUSCULAR | Status: DC | PRN
  Administered 2012-04-03 (×2): 10 mg via INTRAVENOUS

## 2012-04-03 MED ORDER — HYDROMORPHONE HCL PF 1 MG/ML IJ SOLN
0.2500 mg | INTRAMUSCULAR | Status: DC | PRN
  Administered 2012-04-03: 0.5 mg via INTRAVENOUS

## 2012-04-03 SURGICAL SUPPLY — 70 items
ADH SKN CLS APL DERMABOND .7 (GAUZE/BANDAGES/DRESSINGS) ×6
BAG ISL DRAPE 18X18 STRL (DRAPES) ×2
BAG ISOLATION DRAPE 18X18 (DRAPES) IMPLANT
BANDAGE ESMARK 6X9 LF (GAUZE/BANDAGES/DRESSINGS) IMPLANT
BNDG CMPR 9X6 STRL LF SNTH (GAUZE/BANDAGES/DRESSINGS)
BNDG ESMARK 6X9 LF (GAUZE/BANDAGES/DRESSINGS)
BOOT SUTURE AID YELLOW STND (SUTURE) IMPLANT
CANISTER SUCTION 2500CC (MISCELLANEOUS) ×3 IMPLANT
CATH EMB 3FR 80CM (CATHETERS) ×1 IMPLANT
CATH EMB 4FR 80CM (CATHETERS) ×1 IMPLANT
CLIP TI MEDIUM 24 (CLIP) ×3 IMPLANT
CLIP TI WIDE RED SMALL 24 (CLIP) ×3 IMPLANT
CLOTH BEACON ORANGE TIMEOUT ST (SAFETY) ×3 IMPLANT
COVER SURGICAL LIGHT HANDLE (MISCELLANEOUS) ×3 IMPLANT
CUFF TOURNIQUET SINGLE 24IN (TOURNIQUET CUFF) IMPLANT
CUFF TOURNIQUET SINGLE 34IN LL (TOURNIQUET CUFF) IMPLANT
DERMABOND ADVANCED (GAUZE/BANDAGES/DRESSINGS) ×3
DERMABOND ADVANCED .7 DNX12 (GAUZE/BANDAGES/DRESSINGS) ×2 IMPLANT
DRAIN SNY 10X20 3/4 PERF (WOUND CARE) IMPLANT
DRAPE ISOLATION BAG 18X18 (DRAPES) ×1
DRAPE WARM FLUID 44X44 (DRAPE) ×3 IMPLANT
DRAPE X-RAY CASS 24X20 (DRAPES) ×1 IMPLANT
ELECT REM PT RETURN 9FT ADLT (ELECTROSURGICAL) ×3
ELECTRODE REM PT RTRN 9FT ADLT (ELECTROSURGICAL) ×2 IMPLANT
EVACUATOR SILICONE 100CC (DRAIN) IMPLANT
GLOVE BIO SURGEON STRL SZ 6.5 (GLOVE) ×2 IMPLANT
GLOVE BIOGEL PI IND STRL 6.5 (GLOVE) IMPLANT
GLOVE BIOGEL PI IND STRL 7.0 (GLOVE) IMPLANT
GLOVE BIOGEL PI INDICATOR 6.5 (GLOVE) ×3
GLOVE BIOGEL PI INDICATOR 7.0 (GLOVE) ×1
GLOVE ECLIPSE 7.5 STRL STRAW (GLOVE) ×1 IMPLANT
GLOVE SS BIOGEL STRL SZ 7 (GLOVE) ×2 IMPLANT
GLOVE SUPERSENSE BIOGEL SZ 7 (GLOVE) ×2
GOWN PREVENTION PLUS XLARGE (GOWN DISPOSABLE) ×1 IMPLANT
GOWN STRL NON-REIN LRG LVL3 (GOWN DISPOSABLE) ×6 IMPLANT
INSERT FOGARTY SM (MISCELLANEOUS) ×3 IMPLANT
KIT BASIN OR (CUSTOM PROCEDURE TRAY) ×3 IMPLANT
KIT ROOM TURNOVER OR (KITS) ×3 IMPLANT
NS IRRIG 1000ML POUR BTL (IV SOLUTION) ×6 IMPLANT
PACK PERIPHERAL VASCULAR (CUSTOM PROCEDURE TRAY) ×3 IMPLANT
PAD ARMBOARD 7.5X6 YLW CONV (MISCELLANEOUS) ×6 IMPLANT
PADDING CAST COTTON 6X4 STRL (CAST SUPPLIES) IMPLANT
SET COLLECT BLD 21X3/4 12 (NEEDLE) ×1 IMPLANT
SPONGE GAUZE 4X4 12PLY (GAUZE/BANDAGES/DRESSINGS) ×3 IMPLANT
SPONGE LAP 18X18 X RAY DECT (DISPOSABLE) ×1 IMPLANT
STAPLER VISISTAT 35W (STAPLE) ×1 IMPLANT
STOPCOCK 4 WAY LG BORE MALE ST (IV SETS) ×1 IMPLANT
SUT PROLENE 5 0 C 1 24 (SUTURE) ×3 IMPLANT
SUT PROLENE 6 0 BV (SUTURE) ×5 IMPLANT
SUT PROLENE 6 0 C 1 30 (SUTURE) ×1 IMPLANT
SUT PROLENE 6 0 CC (SUTURE) ×3 IMPLANT
SUT PROLENE 7 0 BV 1 (SUTURE) ×1 IMPLANT
SUT PROLENE 7 0 BV1 MDA (SUTURE) IMPLANT
SUT SILK 2 0 SH (SUTURE) ×3 IMPLANT
SUT SILK 3 0 (SUTURE)
SUT SILK 3-0 18XBRD TIE 12 (SUTURE) IMPLANT
SUT SILK 4 0 (SUTURE) ×6
SUT SILK 4-0 18XBRD TIE 12 (SUTURE) IMPLANT
SUT VIC AB 2-0 CTX 36 (SUTURE) ×6 IMPLANT
SUT VIC AB 3-0 SH 27 (SUTURE) ×15
SUT VIC AB 3-0 SH 27X BRD (SUTURE) ×4 IMPLANT
SUT VICRYL 4-0 PS2 18IN ABS (SUTURE) ×3 IMPLANT
SYR 3ML LL SCALE MARK (SYRINGE) ×1 IMPLANT
SYR TB 1ML LUER SLIP (SYRINGE) ×1 IMPLANT
TOWEL OR 17X24 6PK STRL BLUE (TOWEL DISPOSABLE) ×6 IMPLANT
TOWEL OR 17X26 10 PK STRL BLUE (TOWEL DISPOSABLE) ×7 IMPLANT
TRAY FOLEY CATH 14FRSI W/METER (CATHETERS) ×3 IMPLANT
TUBING EXTENTION W/L.L. (IV SETS) ×1 IMPLANT
UNDERPAD 30X30 INCONTINENT (UNDERPADS AND DIAPERS) ×3 IMPLANT
WATER STERILE IRR 1000ML POUR (IV SOLUTION) ×3 IMPLANT

## 2012-04-03 NOTE — Progress Notes (Signed)
Pt. had short beat run of non-sustained v-tach, asymptomatic. Dr. Imogene Burn made aware,  Will follow up labs in am.

## 2012-04-03 NOTE — Progress Notes (Addendum)
ANTICOAGULATION CONSULT NOTE - FOLLOW UP  Pharmacy Consult:  Heparin Indication:  Occluded left femoral bypass graft  Allergies  Allergen Reactions  . Other Other (See Comments)    Plastic tape tears skin    Patient Measurements: Height: 5' 8.11" (173 cm) Weight: 190 lb 0.6 oz (86.2 kg) IBW/kg (Calculated) : 68.65  Heparin Dosing Weight: 79 kg  Vital Signs: Temp: 97.5 F (36.4 C) (01/31 1430) Temp src: Oral (01/31 0400) BP: 135/83 mmHg (01/31 1415) Pulse Rate: 70  (01/31 1430)  Labs:  Basename 04/03/12 0420 04/02/12 0450 04/01/12 2210 04/01/12 1327 04/01/12 0654  HGB 15.0 14.9 -- -- --  HCT 42.5 44.0 -- 43.9 --  PLT 107* 100* -- 112* --  APTT -- -- -- -- 27  LABPROT -- -- -- -- 13.2  INR -- -- -- -- 1.01  HEPARINUNFRC 0.38 0.30 0.37 -- --  CREATININE 1.94* -- -- 1.74* 1.79*  CKTOTAL -- -- -- -- --  CKMB -- -- -- -- --  TROPONINI -- -- -- -- --    Estimated Creatinine Clearance: 34.7 ml/min (by C-G formula based on Cr of 1.94).  Assessment: 57 YOM s/p left femoral bypass graft in July 2013, recently seen in [place] and diagnosed with left femoral graft clot, returning to [facility] on 04/01/12 for thrombolytic therapy.  Patient now is s/p redo left femoral to anterior tibial bypass.  Heparin infusion resumed per MD at 600 units/hr.  No noted bleeding complications.   Goal of Therapy:  Heparin level 0.2-0.5 units/ml per MD Monitor platelets by anticoagulation protocol: Yes   Plan:  - Continue heparin gtt at 600 units/hr - Daily HL / CBC - Heparin consult discontinued by MD - will discontinue f/u by pharmacy.  Nadara Mustard, PharmD., MS Clinical Pharmacist Pager:  [phone] Thank you for allowing pharmacy to be part of this patients care team. 04/03/2012, 3:08 PM

## 2012-04-03 NOTE — Progress Notes (Signed)
Paged Dr. Arbie Cookey at 2100 regarding a grade 4 hematoma found at the Left groin site.  BP was 81/49, is currently 130/65.  Pressure was held until speaking with Dr. Arbie Cookey.  Heparin was d/c'd and Dr. Arbie Cookey requested for me to make sure CBC and Protime/INR was scheduled to be checked tomorrow morning at 0500.  Strict bedrest was also ordered; patient has been on bedrest since sx.  Will watch site closely and repage with changes.  VSS.  Vivi Martens RN

## 2012-04-03 NOTE — Progress Notes (Addendum)
Called vasc DR re: R groin hematoma. No new orders, ok to proceed with hep/coumadin bridge. Will continue to monitor.

## 2012-04-03 NOTE — Progress Notes (Signed)
Pt arrived from PACU, VSS, pressure dressing on Rt groin, feels firm underneath, level 2, will continue to monitor. PACU RN reports holding pressure & placing dressing.

## 2012-04-03 NOTE — Anesthesia Preprocedure Evaluation (Signed)
Anesthesia Evaluation  Patient identified by MRN, date of birth, ID band Patient awake    Reviewed: Allergy & Precautions, H&P , NPO status , Patient's Chart, lab work & pertinent test results  Airway Mallampati: I TM Distance: >3 FB Neck ROM: full    Dental   Pulmonary sleep apnea ,          Cardiovascular hypertension, + CAD, + Peripheral Vascular Disease and +CHF + dysrhythmias Atrial Fibrillation + pacemaker + Cardiac Defibrillator Rhythm:irregular Rate:Tachycardia     Neuro/Psych PSYCHIATRIC DISORDERS  Neuromuscular disease    GI/Hepatic   Endo/Other  diabetes, Type 2, Insulin Dependent  Renal/GU Renal disease     Musculoskeletal   Abdominal   Peds  Hematology   Anesthesia Other Findings   Reproductive/Obstetrics                           Anesthesia Physical Anesthesia Plan  ASA: IV  Anesthesia Plan: General   Post-op Pain Management:    Induction: Intravenous  Airway Management Planned: Oral ETT  Additional Equipment:   Intra-op Plan:   Post-operative Plan: Possible Post-op intubation/ventilation and Extubation in OR  Informed Consent: I have reviewed the patients History and Physical, chart, labs and discussed the procedure including the risks, benefits and alternatives for the proposed anesthesia with the patient or authorized representative who has indicated his/her understanding and acceptance.     Plan Discussed with: CRNA, Anesthesiologist and Surgeon  Anesthesia Plan Comments:         Anesthesia Quick Evaluation

## 2012-04-03 NOTE — Progress Notes (Signed)
Utilization review completed.  

## 2012-04-03 NOTE — Op Note (Signed)
OPERATIVE REPORT  Date of Surgery: 04/01/2012 - 04/03/2012  Surgeon: [name], MD  Assistant: Lelon Mast RHyne,PA  Pre-op Diagnosis: Occluded left femoral tibial saphenous vein graft  Post-op Diagnosis: Occluded left femoral tibial bypass graft due to intimal hyperplasia in the proximal portion of the graft  Procedure: Procedure(s): Redo left femoral to anterior tibial bypass using a nonreversible saphenous vein graft from contralateral right leg with intraoperative arteriogram  Anesthesia: General  EBL: 200 cc  Complications: None  Patient was taken to the operating room placed in supine position at which time satisfactory general endotracheal anesthesia was administered. Both lower extremities were prepped with Betadine scrub and solution draped in routine sterile manner. All of the left saphenous vein had been harvested and the distal portion of the right great saphenous vein and also been removed. There was a left femoral to anterior tibial graft in the lateral position which had received thrombolyzes for 48 hours angiograms reveal interval thickening throughout the proximal half to two thirds of the vein graft. Incision was made in the left groin carried down to subcutaneous tissue the common superficial and profunda femoris arteries dissected free as was the anastomosis of the vein graft which is the very proximal superficial femoral artery. About giving heparin initially and made a short transverse incision in the hood of the vein graft passed a Fogarty catheter distally would easily go to the ankle and anterior tibial artery and the previous vein graft anastomosis seemed widely patent but there was a lot of thickening of the vein graft beginning in the distal thigh extending up to the near the proximal anastomosis. Was decided to remove all the remaining saphenous vein to the right leg and replaces much of the graft as possible. Therefore this transverse opening was reclosed with  continuous 6-0 Prolene suture leaving a weak pulse in the bypass while vein was harvested. Great saphenous vein was completely removed from the saphenofemoral junction to the distal calf through multiple incisions on the medial aspect the right leg. Branches ligated with 4-0 silk ties and divided. Gently dilated with heparinized saline marked for orientation purposes. It was a satisfactory graft being no less than 2-1/2 mm in size. Vein graft was then exposed and the left leg just above the knee initially. Tunnel was then created on the lateral aspect of the left leg the patient was heparinized. The femoral vessels were occluded and the inguinal region and the previous vein graft was transected at its origin. A rod is extended well up into the common femoral artery. Superficial femoral artery was thickened and I think inflow was improved by extending this up in the common femoral level. Proximal end of the vein was spatulated and anastomosed end to side with 6-0 Prolene. Clamps were released there was excellent pulse in the first competent valves. Using a retrograde valvotome the valves were rendered incompetent result Flow distally the vein graft. Following this the new vein graft was delivered through the tunnel previous vein graft transected just above the knee. 3 dilator was passed distally through the vein graft it did seem to hang at where the previous revision anastomosis was performed just below the day therefore a second incision was made distally preserving only a short segment of the previous vein graft. And the vein was then tunneled into this distal wound and after transecting the vein at this point potassium 3 dilator easily down through the anastomosis and the vein graft was anastomosed to the old vein graft an end in fashion  with 6-0 Prolene spatulated both ends. Following completion of this was an excellent pulse in the vein graft and loud Doppler flow in anterior tibial artery. Intraoperative  arteriogram revealed patent anastomosis into the anterior tibial artery although that was partially obscured by extravasation. No protamine was given. The wounds are closed in layers of Vicryl subcuticular fashion and Dermabond patient taken to the recovery in stable condition Procedure Details:   [name], MD 04/03/2012 11:24 AM

## 2012-04-03 NOTE — OR Nursing (Signed)
ICD checked by [employer] rep/ marcia----staes it was not turned off preop per interrogation/chk'd and found to require no changes

## 2012-04-03 NOTE — Anesthesia Postprocedure Evaluation (Signed)
  Anesthesia Post-op Note  Patient: [name]  Procedure(s) Performed: Procedure(s) (LRB) with comments: BYPASS GRAFT FEMORAL-TIBIAL ARTERY (Left) - Redo VEIN HARVEST (Right)  Patient Location: PACU  Anesthesia Type:General  Level of Consciousness: awake, oriented, sedated and patient cooperative  Airway and Oxygen Therapy: Patient Spontanous Breathing  Post-op Pain: mild  Post-op Assessment: Post-op Vital signs reviewed, Patient's Cardiovascular Status Stable, Respiratory Function Stable, Patent Airway, No signs of Nausea or vomiting and Pain level controlled  Post-op Vital Signs: stable  Complications: No apparent anesthesia complications

## 2012-04-03 NOTE — Transfer of Care (Signed)
Immediate Anesthesia Transfer of Care Note  Patient: [name]  Procedure(s) Performed: Procedure(s) (LRB) with comments: BYPASS GRAFT FEMORAL-TIBIAL ARTERY (Left) - Redo VEIN HARVEST (Right)  Patient Location: PACU  Anesthesia Type:General  Level of Consciousness: awake and alert   Airway & Oxygen Therapy: Patient Spontanous Breathing and Patient connected to nasal cannula oxygen  Post-op Assessment: Report given to PACU RN, Post -op Vital signs reviewed and stable and Patient moving all extremities  Post vital signs: Reviewed and stable  Complications: No apparent anesthesia complications

## 2012-04-03 NOTE — Telephone Encounter (Addendum)
Message copied by Rosalyn Charters on Fri Apr 03, 2012 12:58 PM ------      Message from: [place], [place] K      Created: Fri Apr 03, 2012 12:47 PM      Regarding: schedule                   ----- Message -----         From: Dara Lords, PA         Sent: 04/03/2012  11:22 AM           To: Sharee Pimple, CMA            S/p redo left fem/tib bypass using contralateral SV by JDL.  F/u with him in 2 weeks.            Thanks,      Lelon Mast  l/v/m notifying pt. of fu appt. with dr. Hart Rochester on 04-28-12 11:45

## 2012-04-03 NOTE — Preoperative (Signed)
Beta Blockers   Reason not to administer Beta Blockers:Not Applicable 

## 2012-04-04 LAB — CBC
HCT: 32.5 % — ABNORMAL LOW (ref 39.0–52.0)
MCH: 29.5 pg (ref 26.0–34.0)
MCV: 84.2 fL (ref 78.0–100.0)
RBC: 3.86 MIL/uL — ABNORMAL LOW (ref 4.22–5.81)
WBC: 12.2 10*3/uL — ABNORMAL HIGH (ref 4.0–10.5)

## 2012-04-04 LAB — BASIC METABOLIC PANEL
BUN: 37 mg/dL — ABNORMAL HIGH (ref 6–23)
CO2: 23 mEq/L (ref 19–32)
Glucose, Bld: 136 mg/dL — ABNORMAL HIGH (ref 70–99)
Potassium: 4.8 mEq/L (ref 3.5–5.1)
Sodium: 134 mEq/L — ABNORMAL LOW (ref 135–145)

## 2012-04-04 LAB — GLUCOSE, CAPILLARY: Glucose-Capillary: 202 mg/dL — ABNORMAL HIGH (ref 70–99)

## 2012-04-04 MED ORDER — PHENOL 1.4 % MT LIQD: 1.0000 | OROMUCOSAL | Status: DC | PRN

## 2012-04-04 MED ORDER — FLEET ENEMA 7-19 GM/118ML RE ENEM
1.0000 | ENEMA | Freq: Once | RECTAL | Status: AC
  Administered 2012-04-04: 1 via RECTAL
  Filled 2012-04-04: qty 1

## 2012-04-04 MED ORDER — BISACODYL 10 MG RE SUPP
10.0000 mg | Freq: Once | RECTAL | Status: DC
  Filled 2012-04-04: qty 1

## 2012-04-04 NOTE — Progress Notes (Signed)
PT Cancellation Note  Patient Details Name: [name] MRN: [id number] DOB: October 11, 1934   Cancelled Treatment:    Reason Eval/Treat Not Completed: Medical issues which prohibited therapy;Patient not medically ready.  Noted RN note - patient with hematoma left groin site.  Per note, patient on strict bedrest.  Will hold PT today, and continue with eval in am.   Vena Austria 04/04/2012, 7:07 AM [phone]

## 2012-04-04 NOTE — Progress Notes (Addendum)
[facility] [place]  Progress Note Bypass Surgery  Date of Surgery: 04/01/2012 - 04/03/2012  Procedure(s): Left BYPASS GRAFT FEMORAL-TIBIAL ARTERY VEIN HARVEST from right leg Surgeon: Surgeon(s): Pryor Ochoa, MD  1 Day Post-Op  History of Present Illness  [name] is a 78 y.o. male who is S/P Procedure(s): BYPASS GRAFT FEMORAL-TIBIAL ARTERY VEIN HARVEST  The patient's pre-op symptoms of pain are Improved . Patients pain is well controlled.  He had some bleeding from catheter site on left overnight but the groin is soft. He developed a lateral hematoma on left from groin to hip as well.  VASC. LAB Studies:        ABI: pend   Significant Diagnostic Studies: CBC Lab Results  Component Value Date   WBC 12.2* 04/04/2012   HGB 11.4* 04/04/2012   HCT 32.5* 04/04/2012   MCV 84.2 04/04/2012   PLT 158 04/04/2012    BMET     Component Value Date/Time   NA 134* 04/04/2012 0550   K 4.8 04/04/2012 0550   CL 102 04/04/2012 0550   CO2 23 04/04/2012 0550   GLUCOSE 136* 04/04/2012 0550   BUN 37* 04/04/2012 0550   CREATININE 2.46* 04/04/2012 0550   CALCIUM 8.1* 04/04/2012 0550   GFRNONAA 24* 04/04/2012 0550   GFRAA 28* 04/04/2012 0550    COAG Lab Results  Component Value Date   INR 1.25 04/04/2012   INR 1.01 04/01/2012   INR 1.03 02/07/2012   No results found for this basename: PTT    Physical Examination  BP Readings from Last 3 Encounters:  04/04/12 131/69  04/04/12 131/69  04/04/12 131/69   Temp Readings from Last 3 Encounters:  04/04/12 98.7 F (37.1 C) Oral  04/04/12 98.7 F (37.1 C) Oral  04/04/12 98.7 F (37.1 C) Oral   SpO2 Readings from Last 3 Encounters:  04/04/12 94%  04/04/12 94%  04/04/12 94%   Pulse Readings from Last 3 Encounters:  04/04/12 93  04/04/12 93  04/04/12 93    Pt is A&O x 3 left lower extremity: Incision/s is/are clean,dry.intact, and  healing with hematoma on left from groin to hip, no erythema or drainage  Ecchymosis on  right at catheter site, harvest site wounds healing well Limb is warm; with good color Positive palp graft pulse   Assessment/Plan: Pt. Doing well Post-op pain is controlled Heparin on hold Post-op acute blood loss anemia stable Acute on CKD with dye load - continue hydration Wounds have soft hematoma left groin but healing well Ecchymosis on right at catheter site PT/OT for ambulation Continue wound care as ordered  [name] [phone] 04/04/2012 8:25 AM        I have examined the patient, reviewed and agree with above. Palp graft and at pulse .Marland Kitchen Mild fullness R and L groin.  Mobilize, dc foley.Possible dc in AM  EARLY, [name], MD 04/04/2012 11:12 AM

## 2012-04-05 DIAGNOSIS — Z48812 Encounter for surgical aftercare following surgery on the circulatory system: Secondary | ICD-10-CM

## 2012-04-05 LAB — GLUCOSE, CAPILLARY
Glucose-Capillary: 140 mg/dL — ABNORMAL HIGH (ref 70–99)
Glucose-Capillary: 123 mg/dL — ABNORMAL HIGH (ref 70–99)
Glucose-Capillary: 181 mg/dL — ABNORMAL HIGH (ref 70–99)

## 2012-04-05 LAB — PROTIME-INR
INR: 1.72 — ABNORMAL HIGH (ref 0.00–1.49)
Prothrombin Time: 19.6 seconds — ABNORMAL HIGH (ref 11.6–15.2)

## 2012-04-05 LAB — BASIC METABOLIC PANEL
GFR calc Af Amer: 26 mL/min — ABNORMAL LOW (ref >90)
GFR calc non Af Amer: 22 mL/min — ABNORMAL LOW (ref >90)
Potassium: 3.7 mEq/L (ref 3.5–5.1)
Sodium: 137 mEq/L (ref 135–145)

## 2012-04-05 LAB — CBC
Platelets: 135 10*3/uL — ABNORMAL LOW (ref 150–400)
RBC: 3.39 MIL/uL — ABNORMAL LOW (ref 4.22–5.81)
WBC: 10.5 10*3/uL (ref 4.0–10.5)

## 2012-04-05 MED ORDER — WARFARIN SODIUM 5 MG PO TABS: 5.0000 mg | ORAL_TABLET | Freq: Every day | ORAL | Status: DC

## 2012-04-05 MED ORDER — INSULIN ASPART 100 UNIT/ML ~~LOC~~ SOLN
0.0000 [IU] | Freq: Three times a day (TID) | SUBCUTANEOUS | Status: DC
  Administered 2012-04-05: 2 [IU] via SUBCUTANEOUS
  Administered 2012-04-05: 3 [IU] via SUBCUTANEOUS

## 2012-04-05 NOTE — Discharge Summary (Signed)
[facility] Discharge Summary   Patient ID:  [name] MRN: [id number] DOB/AGE: 76-Mar-1936 76 y.o.  Admit date: 04/01/2012 Discharge date: 04/05/2012 Date of Surgery: 04/01/2012 - 04/03/2012 Surgeon: [name]): Jonathan Ochoa, MD  Admission Diagnosis: Other complications due to other vascular device, implant, and graft [996.74] PAD peripheral artery disease [phone] Occluded Left Femoral-Tibial Bypass Graft Occluded left femoral tibial bypass graft  Discharge Diagnoses:  Other complications due to other vascular device, implant, and graft [996.74] PAD peripheral artery disease [phone] Occluded Left Femoral-Tibial Bypass Graft Occluded left femoral tibial bypass graft  Secondary Diagnoses: Past Medical History  Diagnosis Date  . Hyperlipidemia   . Gout   . Benign prostatic hypertrophy   . Atrial fibrillation     sees Dr. Verdis Campos  . Chronic systolic dysfunction of left ventricle     a. mixed ischemic and nonischemic CM,  EF 35%. b. s/p AICD implantation.  . CHF (congestive heart failure)   . PAD (peripheral artery disease)     Severe by PV angiogram 09/2011  . ED (erectile dysfunction)   . Renal artery stenosis     s/p stenting 2009  . Diabetes mellitus     sees Dr. Everardo Campos   . Arthritis   . Pacemaker     medtronic  . CAD (coronary artery disease)     a. s/p mid LAD stenting with DES 2008 Dr. Verdis Campos  . Hypertension     sees Dr. Laurice Campos  . ICD (implantable cardiac defibrillator) in place     medtronic, Dr. Johney Campos  . ICD (implantable cardiac defibrillator) in place     due for check in Feb/2013  . Sleep apnea     hx of "had surgery for"    Procedure(s): Redo Left BYPASS GRAFT FEMORAL-TIBIAL ARTERY VEIN HARVEST from right leg  Discharged Condition: good  HPI:  [name] is a 78 y.o. male who presents for evaluation of occluded left femoral tibial bypass graft. Patient had left femoral to mid anterior tibial saphenous vein  graft in July of 2013 by me. This concluded in December 2013 and was revised distally by Dr. Ayauna Campos using saphenous vein from the distal contralateral right leg. He was seen in the office about 4 weeks ago with a patent bypass a palpable dorsalis pedis pulse. He was in [place] in 3 days ago began having severe recurrent claudication in the left calf. He was seen in the emergency department who noted the graft was in fact occluded. They recommended thrombolyzes and anti-coagulation and patient preferred to return here for further treatment. He states that he had no claudication prior to 3 days ago.   Hospital Course:  Thrombolysis was unsuccessful so pt had redo left fem-tib bypass [name] is a 78 y.o. male is S/P Left Procedure(s): BYPASS GRAFT FEMORAL-TIBIAL ARTERY VEIN HARVEST from right Extubated: POD # 0 Pt is A&O x 3  right lower extremity: Incision/s is/are Soft, clean,dry.intact, and healing  with soft stable hematoma, and ecchymosis but no drainage  Limb is warm; with good color  + tenderness over lateral aspect of right foot  Left groin hematoma soft, palpable graft pulse at knee  Right Dorsalis Pedis pulse is biphasic by Doppler  Right Posterior tibial pulse is biphasic by Doppler   Pt. Doing well  Bilat groin hematoma stable and soft  Acute-post-op anemia is stable  Acute on chronic CK - BUN/CR - 39/2.61 (up from 37/2.46 yest)  Post-op pain is  controlled  Wounds are healing well   INR 1.72 - Resume coumadin today Pt. Ambulating, voiding and taking PO diet without difficulty. Pt pain controlled with PO pain meds. Labs as below Complications:acute on chronic Kidney disease - will hydrate and hold lasix and have repeat labs - BMET and INR at Dr [name] office this week  Consults:     Significant Diagnostic Studies: CBC Lab Results  Component Value Date   WBC 10.5 04/05/2012   HGB 10.0* 04/05/2012   HCT 28.8* 04/05/2012   MCV 85.0 04/05/2012   PLT 135* 04/05/2012     BMET    Component Value Date/Time   NA 137 04/05/2012 0620   K 3.7 04/05/2012 0620   CL 101 04/05/2012 0620   CO2 25 04/05/2012 0620   GLUCOSE 128* 04/05/2012 0620   BUN 39* 04/05/2012 0620   CREATININE 2.61* 04/05/2012 0620   CALCIUM 8.5 04/05/2012 0620   GFRNONAA 22* 04/05/2012 0620   GFRAA 26* 04/05/2012 0620   COAG Lab Results  Component Value Date   INR 1.72* 04/05/2012   INR 1.25 04/04/2012   INR 1.01 04/01/2012     Disposition:  Discharge to :Home  Discharge Orders    Future Appointments: Provider: Department: Dept Phone: Center:   04/07/2012 9:00 AM Jonathan Salisbury, MD [facility] [place] [phone] [facility]   04/13/2012 1:20 PM [facility] [employer] [facility] [place]) [phone] [facility]   04/28/2012 11:45 AM Jonathan Ochoa, MD [facility] [phone] [facility]   06/16/2012 9:30 AM [facility] [phone] [facility]   06/16/2012 10:00 AM Jonathan Ochoa, MD [facility] [phone] [facility]     Future Orders Please Complete By Expires   Resume previous diet      Driving Restrictions      Comments:   No driving for 2 weeks   Lifting restrictions      Comments:   No lifting for 6 weeks   Call MD for:  temperature >100.5      Call MD for:  redness, tenderness, or signs of infection (pain, swelling, bleeding, redness, odor or green/yellow discharge around incision site)      Call MD for:  severe or increased pain, loss or decreased feeling  in affected limb(s)      Discharge wound care:      Comments:   Shower daily with soap and water starting 04/05/12      [name], [name]  Home Medication Instructions [id number]:[id number]   Printed on:04/05/12 1103  Medication Information                    insulin aspart protamine-insulin aspart (NOVOLOG 70/30) (70-30) 100 UNIT/ML injection Inject 6-10 Units into the skin 2 (two) times daily with a meal. 10 units with  breakfast, and 6 units with the evening meal           vitamin E 1000 UNIT capsule Take 1,000 Units by mouth daily.            finasteride (PROSCAR) 5 MG tablet Take 5 mg by mouth daily.           carvedilol (COREG) 6.25 MG tablet Take 1 tablet (6.25 mg total) by mouth 2 (two) times daily with a meal.           NITROSTAT 0.4 MG SL tablet Place 0.4 mg under the tongue every 5 (five) minutes as needed. For  chest pain, max 3 doses           Acetaminophen (TYLENOL PO) Take 1-2 tablets by mouth every 6 (six) hours as needed. pain           aspirin EC 81 MG tablet Take 81 mg by mouth every Monday, Wednesday, and Friday.           oxycodone-acetaminophen (PERCOCET) 2.5-325 MG per tablet Take 1 tablet by mouth every 4 (four) hours as needed. For pain           warfarin (COUMADIN) 5 MG tablet Take 1 tablet (5 mg total) by mouth daily at 6 PM.            Verbal and written Discharge instructions given to the patient. Wound care per Discharge AVS Follow-up Information    Follow up with [name], MD. In 2 weeks. (Office will call you to arrange your appt (sent))    Contact information:   [address] [place] [address] [phone]       Follow up with Lesleigh Noe, MD. (call to make an appointment to have blood draw for coumadin level on wednesday)    Contact information:   [address] [place] [address] [phone]          Signed: [name] 04/05/2012, 11:03 AM

## 2012-04-05 NOTE — Progress Notes (Signed)
VASCULAR LAB PRELIMINARY  ARTERIAL  ABI completed:    RIGHT    LEFT    PRESSURE WAVEFORM  PRESSURE WAVEFORM  BRACHIAL 136 Triphasic BRACHIAL 71 Monophasic  DP 103 Monophasic DP 123 Monophasic  AT   AT    PT Unable to obtain Monophasic PT 135 Monophasic  PER   PER    GREAT TOE  NA GREAT TOE  NA    RIGHT LEFT  ABI 0.76 0.99   The right ABI is suggestive of mild arterial insufficiency. The left ABI is within normal limits. The left brachial artery demonstrates significantly diminished pressure when compared to the right brachial artery, as well as a monophasic waveform. This may be suggestive of a proximal obstruction/occlusion.  04/05/2012 11:08 AM Gertie Fey, RDMS, RDCS

## 2012-04-05 NOTE — Progress Notes (Addendum)
[facility] [place]  Progress Note Bypass Surgery  Date of Surgery: 04/01/2012 - 04/03/2012  Procedure(s): left BYPASS GRAFT FEMORAL-TIBIAL ARTERY VEIN HARVEST from right leg Surgeon: Surgeon(s): Pryor Ochoa, MD  2 Days Post-Op  History of Present Illness  [name] is a 78 y.o. male who is S/P Procedure(s): BYPASS GRAFT FEMORAL-TIBIAL ARTERY VEIN HARVEST  The patient's pre-op symptoms are Improved . Patients pain is well controlled.  Pt C/O some pain to palpation over the lat aspect of right foot " like a Bruise" Beginning to ambulate.  VASC. LAB Studies:        ABI: pend  Significant Diagnostic Studies: CBC Lab Results  Component Value Date   WBC 10.5 04/05/2012   HGB 10.0* 04/05/2012   HCT 28.8* 04/05/2012   MCV 85.0 04/05/2012   PLT 135* 04/05/2012    BMET     Component Value Date/Time   NA 137 04/05/2012 0620   K 3.7 04/05/2012 0620   CL 101 04/05/2012 0620   CO2 25 04/05/2012 0620   GLUCOSE 128* 04/05/2012 0620   BUN 39* 04/05/2012 0620   CREATININE 2.61* 04/05/2012 0620   CALCIUM 8.5 04/05/2012 0620   GFRNONAA 22* 04/05/2012 0620   GFRAA 26* 04/05/2012 0620    COAG Lab Results  Component Value Date   INR 1.72* 04/05/2012   INR 1.25 04/04/2012   INR 1.01 04/01/2012   No results found for this basename: PTT    Physical Examination  BP Readings from Last 3 Encounters:  04/05/12 125/61  04/05/12 125/61  04/05/12 125/61   Temp Readings from Last 3 Encounters:  04/05/12 98.9 F (37.2 C) Oral  04/05/12 98.9 F (37.2 C) Oral  04/05/12 98.9 F (37.2 C) Oral   SpO2 Readings from Last 3 Encounters:  04/05/12 95%  04/05/12 95%  04/05/12 95%   Pulse Readings from Last 3 Encounters:  04/05/12 78  04/05/12 78  04/05/12 78    Pt is A&O x 3 right lower extremity: Incision/s is/are  Soft, clean,dry.intact, and  healing with soft stable hematoma, and ecchymosis but no drainage Limb is warm; with good color + tenderness over lateral aspect of  right foot Left groin hematoma soft, palpable graft pulse at knee  Right Dorsalis Pedis pulse is biphasic by Doppler RightPosterior tibial pulse is  biphasic by Doppler  Assessment/Plan: Pt. Doing well Bilat groin hematoma stable and soft Acute-post-op anemia is stable Acute on chronic CK - BUN/CR  - 39/2.61 (up from 37/2.46 yest) Post-op pain is controlled Wounds are healing well PT/OT for ambulation Continue wound care as ordered INR 1.72 ? Resume coumadin today  [name] [phone] 04/05/2012 9:00 AM        I have examined the patient, reviewed and agree with above. Palpable graft pulse and a well-perfused foot. The patient be discharged home today. I did discuss at length with the patient and his son that his creatinine has 2.6. He will continue with oral hydration. He is making good urine output. We'll coordinate outpatient Coumadin therapy Lenardo Westwood, MD 04/05/2012 11:58 AM

## 2012-04-05 NOTE — Evaluation (Signed)
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 s/p fem tib revision with post op Bil hematomas.  Now progressing wel at eval and no further PT needs, no follow up, no equipment needs.    PT Assessment  Patent does not need any further PT services    Follow Up Recommendations  No PT follow up    Does the patient have the potential to tolerate intense rehabilitation      Barriers to Discharge        Equipment Recommendations  None recommended by PT    Recommendations for Other Services     Frequency      Precautions / Restrictions Precautions Precautions: None Restrictions Weight Bearing Restrictions: No   Pertinent Vitals/Pain       Mobility  Bed Mobility Bed Mobility: Supine to Sit;Sitting - Scoot to Edge of Bed;Sit to Supine Supine to Sit: 7: Independent Sitting - Scoot to Edge of Bed: 7: Independent Sit to Supine: 7: Independent Details for Bed Mobility Assistance: safe mobility Transfers Transfers: Sit to Stand;Stand to Sit Sit to Stand: 7: Independent Stand to Sit: 7: Independent Details for Transfer Assistance: safel mobility Ambulation/Gait Ambulation/Gait Assistance: 5: Supervision Ambulation Distance (Feet): 400 Feet Assistive device: Rolling walker;None Ambulation/Gait Assistance Details: steady whether using RW on nothing. Gait Pattern: Within Functional Limits;Decreased step length - right;Decreased step length - left;Decreased stride length Gait velocity: slower Stairs: Yes Stairs Assistance: 5: Supervision Stair Management Technique: One rail Right;Alternating pattern;Forwards Number of Stairs: 8     Shoulder Instructions     Exercises     PT Diagnosis:    PT Problem List:   PT Treatment Interventions:     PT  Goals    Visit Information  Last PT Received On: 04/05/12 Assistance Needed: +1    Subjective Data  Subjective: I think it's easier to walk without the RW Patient Stated Goal: Independent   Prior Functioning  Home Living Lives With: Significant other (son visiting) Available Help at Discharge: Other (Comment) (girlfriend) Type of Home: House Home Access: Level entry Home Layout: One level Bathroom Shower/Tub: Engineer, manufacturing systems: Standard Home Adaptive Equipment: Straight cane Prior Function Level of Independence: Independent Able to Take Stairs?: Yes Driving: Yes Communication Communication: No difficulties    Cognition  Overall Cognitive Status: Appears within functional limits for tasks assessed/performed Arousal/Alertness: Awake/alert Orientation Level: Appears intact for tasks assessed Behavior During Session: [facility] for tasks performed    Extremity/Trunk Assessment Right Upper Extremity Assessment RUE ROM/Strength/Tone: Within functional levels Left Upper Extremity Assessment LUE ROM/Strength/Tone: Within functional levels Right Lower Extremity Assessment RLE ROM/Strength/Tone: Within functional levels Left Lower Extremity Assessment LLE ROM/Strength/Tone: Within functional levels Trunk Assessment Trunk Assessment: Normal   Balance Balance Balance Assessed: No  End of Session PT - End of Session Activity Tolerance: Patient tolerated treatment well Patient left: in bed;with call bell/phone within reach;with family/visitor present Nurse Communication: Mobility status  GP     [name], [name] 04/05/2012, 11:54 AM  04/05/2012  [name], PT [phone] (pager)

## 2012-04-06 ENCOUNTER — Encounter (HOSPITAL_COMMUNITY): Payer: Self-pay | Admitting: Vascular Surgery

## 2012-04-06 NOTE — Progress Notes (Signed)
Called Dr. Mendel Ryder office at [facility] and left message for Dr. Katrinka Blazing nurse, Sherlyn Lees: Pt now on Coumadin. He will need INR and BMET drawn on Wednesday. Pt needs F/U BMET for increase in CR after dye load with thrombolysis.  He had a redo Left Fem-Tibial bypass with GSV from right leg.  [name] J 04/06/2012 2:24 PM

## 2012-04-07 ENCOUNTER — Encounter: Payer: Medicare Other | Admitting: Family Medicine

## 2012-04-07 NOTE — Progress Notes (Signed)
Dr Katrinka Blazing Office would prefer PCP Dr. Gershon Crane follow [name] coumadin as this is not for Cardiac issue.  Spoke with Nettie Elm at Dr. Claris Che Office and they will contact pt for INR and BMET tomorrow.  Pt was taken off Lasix with bump in Creatinine  He was sent home on 5mg  warfarin daily  Lab Results  Component Value Date   CREATININE 2.61* 04/05/2012   Lab Results  Component Value Date   INR 1.72* 04/05/2012   INR 1.25 04/04/2012   INR 1.01 04/01/2012

## 2012-04-08 ENCOUNTER — Ambulatory Visit (INDEPENDENT_AMBULATORY_CARE_PROVIDER_SITE_OTHER): Payer: Medicare Other | Admitting: Family

## 2012-04-08 ENCOUNTER — Telehealth: Payer: Self-pay | Admitting: Family Medicine

## 2012-04-08 DIAGNOSIS — I4891 Unspecified atrial fibrillation: Secondary | ICD-10-CM

## 2012-04-08 NOTE — Telephone Encounter (Signed)
Stay off Lasix as long as the fluid in his legs stays down. If the swelling comes back, we will restart the lasix

## 2012-04-08 NOTE — Patient Instructions (Addendum)
Take 1/2 tab a day. Recheck in 7-10 days.     Latest dosing instructions   Total Sun Mon Tue Wed Thu Fri Sat   17.5 2.5 mg 2.5 mg 2.5 mg 2.5 mg 2.5 mg 2.5 mg 2.5 mg    (5 mg0.5) (5 mg0.5) (5 mg0.5) (5 mg0.5) (5 mg0.5) (5 mg0.5) (5 mg0.5)

## 2012-04-08 NOTE — Telephone Encounter (Signed)
Patient was advised to stop lasiks per Dr. Hart Rochester, pt would like to know when to continue it. Please advise. Thank you.

## 2012-04-09 NOTE — Telephone Encounter (Signed)
I spoke with pt  

## 2012-04-13 ENCOUNTER — Encounter: Payer: Self-pay | Admitting: Internal Medicine

## 2012-04-13 ENCOUNTER — Other Ambulatory Visit: Payer: Self-pay | Admitting: Internal Medicine

## 2012-04-13 ENCOUNTER — Ambulatory Visit (INDEPENDENT_AMBULATORY_CARE_PROVIDER_SITE_OTHER): Payer: Medicare Other | Admitting: *Deleted

## 2012-04-13 DIAGNOSIS — Z95810 Presence of automatic (implantable) cardiac defibrillator: Secondary | ICD-10-CM

## 2012-04-13 DIAGNOSIS — I509 Heart failure, unspecified: Secondary | ICD-10-CM

## 2012-04-13 DIAGNOSIS — I428 Other cardiomyopathies: Secondary | ICD-10-CM

## 2012-04-13 DIAGNOSIS — I5022 Chronic systolic (congestive) heart failure: Secondary | ICD-10-CM

## 2012-04-15 ENCOUNTER — Ambulatory Visit (INDEPENDENT_AMBULATORY_CARE_PROVIDER_SITE_OTHER): Payer: Medicare Other | Admitting: Family

## 2012-04-15 DIAGNOSIS — I4891 Unspecified atrial fibrillation: Secondary | ICD-10-CM

## 2012-04-15 LAB — POCT INR: INR: 2

## 2012-04-15 NOTE — Patient Instructions (Addendum)
Take 1/2 tab a day. Recheck in 3 weeks.   Anticoagulation Dose Instructions as of 04/15/2012     Glynis Smiles Tue Wed Thu Fri Sat   New Dose 2.5 mg 2.5 mg 2.5 mg 2.5 mg 2.5 mg 2.5 mg 2.5 mg    Description       Take 1/2 tab a day. Recheck in 3 weeks.

## 2012-04-16 LAB — REMOTE ICD DEVICE
BATTERY VOLTAGE: 2.9991 V
LV LEAD IMPEDENCE ICD: 893 Ohm
LV LEAD THRESHOLD: 1.25 V
RV LEAD THRESHOLD: 1 V
TOT-0001: 1
TOT-0006: 20111024000000
TZAT-0001ATACH: 2
TZAT-0002ATACH: NEGATIVE
TZAT-0004SLOWVT: 8
TZAT-0004SLOWVT: 8
TZAT-0011SLOWVT: 10 ms
TZAT-0011SLOWVT: 10 ms
TZAT-0012ATACH: 150 ms
TZAT-0012ATACH: 150 ms
TZAT-0012FASTVT: 200 ms
TZAT-0012SLOWVT: 200 ms
TZAT-0012SLOWVT: 200 ms
TZAT-0018ATACH: NEGATIVE
TZAT-0018FASTVT: NEGATIVE
TZAT-0019ATACH: 6 V
TZAT-0019ATACH: 6 V
TZAT-0019FASTVT: 8 V
TZAT-0020ATACH: 1.5 ms
TZAT-0020ATACH: 1.5 ms
TZAT-0020FASTVT: 1.5 ms
TZAT-0020SLOWVT: 1.5 ms
TZON-0003ATACH: 350 ms
TZON-0003SLOWVT: 320 ms
TZON-0003VSLOWVT: 450 ms
TZST-0001ATACH: 4
TZST-0001FASTVT: 2
TZST-0001FASTVT: 6
TZST-0001SLOWVT: 4
TZST-0001SLOWVT: 5
TZST-0002ATACH: NEGATIVE
TZST-0002FASTVT: NEGATIVE
TZST-0002FASTVT: NEGATIVE
TZST-0003SLOWVT: 30 J
TZST-0003SLOWVT: 35 J
TZST-0003SLOWVT: 35 J
VENTRICULAR PACING ICD: 89.59 pct
VF: 0

## 2012-04-20 ENCOUNTER — Ambulatory Visit: Payer: Medicare Other | Admitting: Surgery

## 2012-04-27 ENCOUNTER — Encounter: Payer: Self-pay | Admitting: Vascular Surgery

## 2012-04-28 ENCOUNTER — Ambulatory Visit (INDEPENDENT_AMBULATORY_CARE_PROVIDER_SITE_OTHER): Payer: Medicare Other | Admitting: Vascular Surgery

## 2012-04-28 ENCOUNTER — Encounter: Payer: Self-pay | Admitting: Vascular Surgery

## 2012-04-28 ENCOUNTER — Other Ambulatory Visit: Payer: Self-pay | Admitting: *Deleted

## 2012-04-28 ENCOUNTER — Encounter: Payer: Self-pay | Admitting: *Deleted

## 2012-04-28 VITALS — BP 97/71 | HR 82 | Ht 68.0 in | Wt 187.6 lb

## 2012-04-28 DIAGNOSIS — Z48812 Encounter for surgical aftercare following surgery on the circulatory system: Secondary | ICD-10-CM

## 2012-04-28 DIAGNOSIS — I739 Peripheral vascular disease, unspecified: Secondary | ICD-10-CM

## 2012-04-28 NOTE — Progress Notes (Signed)
Subjective:     Patient ID: [name], male   DOB: [birth year], 78 y.o.   MRN: [id number]  HPI this 78 year old male is status post redo left femoral to anterior tibial bypass graft using saphenous vein from the contralateral right leg on 01/31/ 2014. He has been having some discomfort in the left inguinal area. He states that he is not having claudication in the left calf. He has no other specific complaints other than he has had some oozing from her right forearm wound where he removed a Band-Aid 2 days ago. He is on Coumadin.   Review of Systems     Objective:   Physical Exam BP 97/71  Pulse 82  Ht 5\' 8"  (1.727 m)  Wt 187 lb 9.6 oz (85.095 kg)  BMI 28.53 kg/m2  SpO2 100%  General well-developed well-nourished male in no apparent stress alert and oriented x3 Left lower extremity with minimal erythema and inguinal wound. No drainage. 3+ pulse and femoral anterior tibial graft at knee level and 2+ anterior tibial pulse and foot. Left foot well-perfused. Right upper extremity with some excoriation of skin measuring 2 x 1 cm wear Band-Aid was placed.      Assessment:     Nicely functioning left femoral anterior tibial bypass graft-redo saphenous vein from contralateral right leg Mild erythema left inguinal area-Will treat with Keflex for 10 days-does not appear infected     Plan:     Return in 2 months with duplex scan left femoral anterior tibial bypass grafting check ABIs  Keflex 500 mg 1 3 times a day x10 days

## 2012-05-05 ENCOUNTER — Other Ambulatory Visit: Payer: Self-pay | Admitting: *Deleted

## 2012-05-06 ENCOUNTER — Encounter: Payer: Self-pay | Admitting: Family Medicine

## 2012-05-06 ENCOUNTER — Ambulatory Visit (INDEPENDENT_AMBULATORY_CARE_PROVIDER_SITE_OTHER): Payer: Medicare Other | Admitting: Family Medicine

## 2012-05-06 ENCOUNTER — Ambulatory Visit: Payer: Medicare Other | Admitting: Family

## 2012-05-06 VITALS — BP 114/62 | HR 88 | Temp 98.6°F | Ht 67.75 in | Wt 185.0 lb

## 2012-05-06 DIAGNOSIS — Z0000 Encounter for general adult medical examination without abnormal findings: Secondary | ICD-10-CM

## 2012-05-06 DIAGNOSIS — I4891 Unspecified atrial fibrillation: Secondary | ICD-10-CM

## 2012-05-06 LAB — BASIC METABOLIC PANEL
CO2: 25 mEq/L (ref 19–32)
Calcium: 9 mg/dL (ref 8.4–10.5)
Creatinine, Ser: 2.3 mg/dL — ABNORMAL HIGH (ref 0.4–1.5)
GFR: 30.28 mL/min — ABNORMAL LOW (ref >60.00)
Sodium: 140 mEq/L (ref 135–145)

## 2012-05-06 LAB — HEPATIC FUNCTION PANEL
ALT: 15 U/L (ref 0–53)
Bilirubin, Direct: 0.2 mg/dL (ref 0.0–0.3)
Total Bilirubin: 1.4 mg/dL — ABNORMAL HIGH (ref 0.3–1.2)
Total Protein: 6.5 g/dL (ref 6.0–8.3)

## 2012-05-06 LAB — POCT URINALYSIS DIPSTICK
Glucose, UA: NEGATIVE
Ketones, UA: NEGATIVE
Leukocytes, UA: NEGATIVE
Protein, UA: NEGATIVE
Spec Grav, UA: 1.01
Urobilinogen, UA: 0.2

## 2012-05-06 LAB — LIPID PANEL
Cholesterol: 267 mg/dL — ABNORMAL HIGH (ref 0–200)
HDL: 42.6 mg/dL (ref >39.00)
Total CHOL/HDL Ratio: 6
Triglycerides: 158 mg/dL — ABNORMAL HIGH (ref 0.0–149.0)

## 2012-05-06 LAB — CBC WITH DIFFERENTIAL/PLATELET
Basophils Relative: 1 % (ref 0.0–3.0)
Hemoglobin: 13.2 g/dL (ref 13.0–17.0)
Lymphocytes Relative: 22.3 % (ref 12.0–46.0)
MCHC: 33.5 g/dL (ref 30.0–36.0)
Monocytes Relative: 10.4 % (ref 3.0–12.0)
Neutro Abs: 4.1 10*3/uL (ref 1.4–7.7)
RBC: 4.41 Mil/uL (ref 4.22–5.81)

## 2012-05-06 LAB — TSH: TSH: 1.85 u[IU]/mL (ref 0.35–5.50)

## 2012-05-06 MED ORDER — HYDROCODONE-ACETAMINOPHEN 10-325 MG PO TABS: 1.0000 | ORAL_TABLET | Freq: Four times a day (QID) | ORAL | Status: DC | PRN

## 2012-05-06 NOTE — Progress Notes (Signed)
  Subjective:    Patient ID: [name], male    DOB: 10/06/1934, 78 y.o.   MRN: [id number]  HPI 78 yr old male for a cpx. He has a number of issues going on. He recently had another arterial bypass surgery on his legs, so now he has had both of them done per Dr. Hart Rochester. He has a fair amount of pain in both legs and asks for more pain meds. He tries to walk about 50 or so feet several times a day.    Review of Systems  Constitutional: Negative.   HENT: Negative.   Eyes: Negative.   Respiratory: Negative.   Cardiovascular: Negative.   Gastrointestinal: Negative.   Genitourinary: Negative.   Musculoskeletal: Negative.   Skin: Negative.   Neurological: Negative.   Psychiatric/Behavioral: Negative.        Objective:   Physical Exam  Constitutional: He is oriented to person, place, and time. He appears well-developed and well-nourished. No distress.  HENT:  Head: Normocephalic and atraumatic.  Right Ear: External ear normal.  Left Ear: External ear normal.  Nose: Nose normal.  Mouth/Throat: Oropharynx is clear and moist. No oropharyngeal exudate.  Eyes: Conjunctivae and EOM are normal. Pupils are equal, round, and reactive to light. Right eye exhibits no discharge. Left eye exhibits no discharge. No scleral icterus.  Neck: Neck supple. No JVD present. No tracheal deviation present. No thyromegaly present.  Cardiovascular: Normal rate, regular rhythm, normal heart sounds and intact distal pulses.  Exam reveals no gallop and no friction rub.   No murmur heard. Pulmonary/Chest: Effort normal and breath sounds normal. No respiratory distress. He has no wheezes. He has no rales. He exhibits no tenderness.  Abdominal: Soft. Bowel sounds are normal. He exhibits no distension and no mass. There is no tenderness. There is no rebound and no guarding.  Genitourinary: Rectum normal, prostate normal and penis normal. Guaiac negative stool. No penile tenderness.  Musculoskeletal: Normal range  of motion. He exhibits no edema and no tenderness.  Lymphadenopathy:    He has no cervical adenopathy.  Neurological: He is alert and oriented to person, place, and time. He has normal reflexes. No cranial nerve deficit. He exhibits normal muscle tone. Coordination normal.  Skin: Skin is warm and dry. No rash noted. He is not diaphoretic. No erythema. No pallor.  Psychiatric: He has a normal mood and affect. His behavior is normal. Judgment and thought content normal.          Assessment & Plan:  Well exam. Given some Vicodin for postoperative pain. Get fasting labs. Set up a colonoscopy.

## 2012-05-06 NOTE — Patient Instructions (Signed)
Take 1/2 tab a day. Recheck in 4 weeks.   Anticoagulation Dose Instructions as of 05/06/2012     [name] Tue Wed Thu Fri Sat   New Dose 2.5 mg 2.5 mg 2.5 mg 2.5 mg 2.5 mg 2.5 mg 2.5 mg    Description       Take 1/2 tab a day. Recheck in 4 weeks.

## 2012-05-09 MED ORDER — ATORVASTATIN CALCIUM 20 MG PO TABS: 20.0000 mg | ORAL_TABLET | Freq: Every day | ORAL | Status: DC

## 2012-05-09 NOTE — Addendum Note (Signed)
Addended by: Alfred Levins D on: 05/09/2012 11:41 AM   Modules accepted: Orders

## 2012-05-14 ENCOUNTER — Encounter: Payer: Self-pay | Admitting: Gastroenterology

## 2012-05-18 ENCOUNTER — Encounter: Payer: Self-pay | Admitting: Physician Assistant

## 2012-05-18 ENCOUNTER — Ambulatory Visit (INDEPENDENT_AMBULATORY_CARE_PROVIDER_SITE_OTHER): Payer: Medicare Other | Admitting: Physician Assistant

## 2012-05-18 ENCOUNTER — Telehealth: Payer: Self-pay | Admitting: Family Medicine

## 2012-05-18 VITALS — BP 126/58 | HR 78 | Ht 68.0 in | Wt 189.0 lb

## 2012-05-18 DIAGNOSIS — I255 Ischemic cardiomyopathy: Secondary | ICD-10-CM | POA: Insufficient documentation

## 2012-05-18 DIAGNOSIS — I2510 Atherosclerotic heart disease of native coronary artery without angina pectoris: Secondary | ICD-10-CM

## 2012-05-18 DIAGNOSIS — Z1211 Encounter for screening for malignant neoplasm of colon: Secondary | ICD-10-CM

## 2012-05-18 DIAGNOSIS — Z7901 Long term (current) use of anticoagulants: Secondary | ICD-10-CM

## 2012-05-18 DIAGNOSIS — Z95810 Presence of automatic (implantable) cardiac defibrillator: Secondary | ICD-10-CM

## 2012-05-18 DIAGNOSIS — E119 Type 2 diabetes mellitus without complications: Secondary | ICD-10-CM

## 2012-05-18 DIAGNOSIS — I2589 Other forms of chronic ischemic heart disease: Secondary | ICD-10-CM

## 2012-05-18 MED ORDER — LOSARTAN POTASSIUM 100 MG PO TABS: 100.0000 mg | ORAL_TABLET | Freq: Every day | ORAL | Status: DC

## 2012-05-18 NOTE — Progress Notes (Signed)
I agree with the plan outlined in this note.  He is at elevated risk for procedure related complpications given age, blood thinners, weak heart.

## 2012-05-18 NOTE — Telephone Encounter (Signed)
Refill request for Losartan 100 mg a 90 day supply and I did send e-scribe.

## 2012-05-18 NOTE — Progress Notes (Addendum)
Subjective:    Patient ID: [name], male    DOB: 1934-07-16, 78 y.o.   MRN: [id number]  HPI  [name] is a pleasant 78 year old white male who was referred by Dr. Clent Ridges today to discuss screening colonoscopy. By peptic records it appears that he had colonoscopy with Dr. Ewing Schlein.in 2001 though I do not have a copy of that report today. He had seen Dr. Christella Hartigan in 2007  and had an upper endoscopy at that time for placement of a pH probe . He has multiple medical issues including an ischemic/nonischemic cardiomyopathy with EF estimated at 35%, chronic congestive heart failure, he is status post ICD placement in 2011. He has significant peripheral vascular disease and is status post recent redo left femoral to anterior tibial bypass grafting in January of 2014. He says he said 3 surgeries on his lower extremities over the past year for vascular disease. He also has coronary artery disease- status post remote coronary stent and atrial fibrillation. He is on chronic Coumadin. He has no current GI complaints. Specifically denies any changes in his bowel habits, problems with diarrhea constipation melena or hematochezia. He denies any abdominal pain, he has no problems with heartburn indigestion or dysphagia. His early history is negative for colon cancer. He brought an article with him from the Sunday paper today that stated that colon screening was stopping at age 78.    Review of Systems  Constitutional: Negative.   HENT: Negative.   Eyes: Negative.   Respiratory: Negative.   Cardiovascular: Negative.   Gastrointestinal: Negative.   Endocrine: Negative.   Genitourinary: Negative.   Allergic/Immunologic: Negative.   Neurological: Negative.   Hematological: Negative.   Psychiatric/Behavioral: Negative.    Outpatient Prescriptions Prior to Visit  Medication Sig Dispense Refill  . atorvastatin (LIPITOR) 20 MG tablet Take 1 tablet (20 mg total) by mouth daily.  90 tablet  3  . carvedilol (COREG)  6.25 MG tablet Take 1 tablet (6.25 mg total) by mouth 2 (two) times daily with a meal.  60 tablet  5  . furosemide (LASIX) 40 MG tablet Take 40 mg by mouth daily.      Marland Kitchen gabapentin (NEURONTIN) 300 MG capsule Take 300 mg by mouth 2 (two) times daily.      Marland Kitchen HYDROcodone-acetaminophen (NORCO) 10-325 MG per tablet Take 1 tablet by mouth every 6 (six) hours as needed for pain.  120 tablet  1  . insulin aspart protamine-insulin aspart (NOVOLOG 70/30) (70-30) 100 UNIT/ML injection Inject 10 Units into the skin 2 (two) times daily with a meal. 10 units with breakfast, and 10 units with the evening meal      . NITROSTAT 0.4 MG SL tablet Place 0.4 mg under the tongue every 5 (five) minutes as needed. For chest pain, max 3 doses      . oxycodone-acetaminophen (PERCOCET) 2.5-325 MG per tablet Take 1 tablet by mouth every 4 (four) hours as needed. For pain  30 tablet  0  . vitamin E 1000 UNIT capsule Take 1,000 Units by mouth daily.       Marland Kitchen warfarin (COUMADIN) 5 MG tablet Take 2.5 mg by mouth daily at 6 PM.      . losartan (COZAAR) 100 MG tablet Take 100 mg by mouth daily.      . Acetaminophen (TYLENOL PO) Take 1-2 tablets by mouth every 6 (six) hours as needed. pain       No facility-administered medications prior to visit.   Allergies  Allergen  Reactions  . Other Other (See Comments)    Plastic tape tears skin   Patient Active Problem List  Diagnosis  . HYPERLIPIDEMIA  . GOUT  . PERIPHERAL NEUROPATHY  . HYPERTENSION  . CORONARY ARTERY DISEASE  . ATRIAL FIBRILLATION  . RENAL INSUFFICIENCY  . UTI  . BENIGN PROSTATIC HYPERTROPHY  . CELLULITIS, LEG, RIGHT  . ECZEMA  . LATERAL EPICONDYLITIS  . DIZZINESS  . LYMPHADENOPATHY, REACTIVE  . ABDOMINAL PAIN, GENERALIZED  . BENIGN PROSTATIC HYPERTROPHY, HX OF  . Chronic systolic congestive heart failure  . Burning sensation of feet  . Bursitis of right hip  . Cervicalgia  . Balance disorder  . Neurogenic claudication  . Lumbago  . CAD (coronary  artery disease)  . PAD (peripheral artery disease)  . Type I (juvenile type) diabetes mellitus with peripheral circulatory disorders, uncontrolled  . Atherosclerotic PVD with intermittent claudication  . Atherosclerosis of native arteries of the extremities with intermittent claudication  . Peripheral vascular disease, unspecified  . Pain in limb  . Cardiomyopathy, ischemic  . S/P ICD (internal cardiac defibrillator) procedure  . Diabetes   History  Substance Use Topics  . Smoking status: Never Smoker   . Smokeless tobacco: Never Used     Comment: 1-2 cigars when golfing  . Alcohol Use: 1.5 oz/week    3 drink(s) per week   family history includes Cancer in his father and unspecified family member and Heart disease in an unspecified family member.  There is no history of Diabetes.     Objective:   Physical Exam  well-developed elderly white male in no acute distress, pleasant blood pressure 126/58 pulse 78 height 5 foot 8 weight 189. HEENT; nontraumatic normocephalic EOMI PERRLA sclera anicteric, Neck ;supple no JVD, Cardiovascular; regular rate and rhythm with S1-S2  , soft systolic murmur pulmonary clear bilaterally, Abdomen; soft, nontender nondistended bowel sounds are active there is no palpable mass or hepatosplenomegaly, Rectal; exam not done, Extremities; no clubbing cyanosis or edema he has a healing incisional scar on his right lower extremity from bypass grafting. Psych ;mood and affect normal and appropriate        Assessment & Plan:  #57 78 year old male here for colon neoplasia screening. Patient is asymptomatic and has multiple comorbidities, he is also on chronic anticoagulation with Coumadin and has had recent lower extremity bypass grafting. Family history is negative for colon cancer and he is at average risk  Plan; will obtain report from colonoscopy done in 2001 per Dr.Magod I do not feel he is a good candidate for screening colonoscopy given multiple medical  problems and chronic anticoagulation. We discussed risks and benefits of both colonoscopy and coming off of his Coumadin. Discussed alternative colon  screening methods and will proceed with Barium Enema. He would like to wait until he has had his followup with Dr. Hart Rochester and will plan to schedule Barium Enema towards the end of April 2014 . If this study is negative he should not need any further colon screening.  Addendum- received  Colon reports from Dr. Ewing Schlein. Pt had colon in 2000 with one 3 mm adenomatous polyp. Repeat colon in 2001-2 dimunitive hyperplastic polyps removed.

## 2012-05-18 NOTE — Patient Instructions (Addendum)
End of April call us to schedule a Barium Enema.

## 2012-05-20 ENCOUNTER — Telehealth: Payer: Self-pay | Admitting: Family Medicine

## 2012-05-20 NOTE — Telephone Encounter (Signed)
Refill request for One Touch Ultra test strips and send to [facility].

## 2012-05-21 ENCOUNTER — Other Ambulatory Visit: Payer: Self-pay

## 2012-05-21 MED ORDER — WARFARIN SODIUM 5 MG PO TABS: 2.5000 mg | ORAL_TABLET | Freq: Every day | ORAL | Status: DC

## 2012-05-21 NOTE — Telephone Encounter (Signed)
Pt also needs refills on Carvedilol 6.25 mg bid & Lasix 40 mg qd ( 90 day supply to [facility] )

## 2012-05-22 MED ORDER — FUROSEMIDE 40 MG PO TABS: 40.0000 mg | ORAL_TABLET | Freq: Every day | ORAL | Status: DC

## 2012-05-22 MED ORDER — CARVEDILOL 6.25 MG PO TABS: 6.2500 mg | ORAL_TABLET | Freq: Two times a day (BID) | ORAL | Status: DC

## 2012-05-22 MED ORDER — GLUCOSE BLOOD VI STRP: ORAL_STRIP | Status: DC

## 2012-05-22 NOTE — Telephone Encounter (Signed)
I sent in all 3 scripts

## 2012-06-02 ENCOUNTER — Encounter: Payer: Self-pay | Admitting: Vascular Surgery

## 2012-06-02 ENCOUNTER — Ambulatory Visit (INDEPENDENT_AMBULATORY_CARE_PROVIDER_SITE_OTHER): Payer: Medicare Other | Admitting: Vascular Surgery

## 2012-06-02 VITALS — BP 94/71 | HR 73 | Temp 98.2°F | Ht 68.0 in | Wt 190.0 lb

## 2012-06-02 DIAGNOSIS — R609 Edema, unspecified: Secondary | ICD-10-CM

## 2012-06-02 DIAGNOSIS — R600 Localized edema: Secondary | ICD-10-CM | POA: Insufficient documentation

## 2012-06-02 NOTE — Progress Notes (Signed)
Subjective:     Patient ID: [name], male   DOB: Jul 26, 1934, 78 y.o.   MRN: [id number]  HPI this 78 year old male had a redo left femoral to anterior tibial bypass graft performed 03/17/2012 by me. He returns today because of an area in the right leg vein harvesting site below the knee which he is concerned about infection. There is been some minimal drainage. He is on chronic Coumadin. He did not traumatize the right leg but has noticed some edema. He is not having claudication symptoms in the left leg at the present time he checks the pulse on the graft of the left leg which is superficial on a daily basis   Review of Systems     Objective:   Physical Exam BP 94/71  Pulse 73  Temp(Src) 98.2 F (36.8 C) (Oral)  Ht 5\' 8"  (1.727 m)  Wt 190 lb (86.183 kg)  BMI 28.9 kg/m2  SpO2 97%  Left lower extremity with 3+ graft pulse in 2-3+ dorsalis pulse palpable: Well-healed Right leg with a circular eschar overlying a vein harvesting incision in the mid calf medially measuring about 1-1/2 cm in diameter. No evidence of infection or purulent drainage. Right foot well-perfused. This eschar was removed and a clean dressing applied      Assessment:     Eschar in vein harvesting incision-noninfected-right lower extremity    Plan:     Keep his right leg wound clean with soap and water daily and apply Band-Aid Return in 4 weeks as scheduled for duplex scan of left femoral anterior tibial bypass

## 2012-06-05 ENCOUNTER — Ambulatory Visit: Payer: Medicare Other | Admitting: Family Medicine

## 2012-06-08 ENCOUNTER — Other Ambulatory Visit: Payer: Self-pay | Admitting: Family Medicine

## 2012-06-09 NOTE — Telephone Encounter (Signed)
I think this is Dr. Claris Che patient. Will forward to him.

## 2012-06-16 ENCOUNTER — Ambulatory Visit: Payer: Medicare Other | Admitting: Vascular Surgery

## 2012-06-18 ENCOUNTER — Ambulatory Visit (INDEPENDENT_AMBULATORY_CARE_PROVIDER_SITE_OTHER): Payer: Medicare Other | Admitting: Family

## 2012-06-18 ENCOUNTER — Other Ambulatory Visit: Payer: Self-pay

## 2012-06-18 DIAGNOSIS — I4891 Unspecified atrial fibrillation: Secondary | ICD-10-CM

## 2012-06-18 LAB — POCT INR: INR: 3.8

## 2012-06-18 NOTE — Patient Instructions (Addendum)
Continue to hold coumadin for procedure. Resume the evening of the procedure. Recheck on Tuesday April 29.  Anticoagulation Dose Instructions as of 06/18/2012     Glynis Smiles Tue Wed Thu Fri Sat   New Dose 2.5 mg 2.5 mg 2.5 mg 2.5 mg 2.5 mg 2.5 mg 2.5 mg    Description       Continue to hold coumadin for procedure. Resume the evening of the procedure. Recheck on Tuesday April 29.

## 2012-06-18 NOTE — Progress Notes (Signed)
error 

## 2012-06-29 ENCOUNTER — Encounter: Payer: Self-pay | Admitting: Vascular Surgery

## 2012-06-30 ENCOUNTER — Ambulatory Visit (INDEPENDENT_AMBULATORY_CARE_PROVIDER_SITE_OTHER): Payer: Medicare Other | Admitting: Vascular Surgery

## 2012-06-30 ENCOUNTER — Encounter: Payer: Self-pay | Admitting: Vascular Surgery

## 2012-06-30 ENCOUNTER — Ambulatory Visit (INDEPENDENT_AMBULATORY_CARE_PROVIDER_SITE_OTHER): Payer: Medicare Other | Admitting: Family

## 2012-06-30 ENCOUNTER — Other Ambulatory Visit: Payer: Self-pay

## 2012-06-30 VITALS — BP 131/79 | HR 69 | Resp 16 | Ht 68.0 in | Wt 183.0 lb

## 2012-06-30 DIAGNOSIS — I4891 Unspecified atrial fibrillation: Secondary | ICD-10-CM

## 2012-06-30 DIAGNOSIS — Z48812 Encounter for surgical aftercare following surgery on the circulatory system: Secondary | ICD-10-CM | POA: Insufficient documentation

## 2012-06-30 DIAGNOSIS — I739 Peripheral vascular disease, unspecified: Secondary | ICD-10-CM

## 2012-06-30 NOTE — Progress Notes (Signed)
Subjective:     Patient ID: [name], male   DOB: 02/12/1935, 78 y.o.   MRN: [id number]  HPI this 78-year-old male returns 3 months post redo left femoral to anterior tibial vein graft using saphenous vein from the contralateral right leg. Patient denies claudication symptoms. He is on chronic Coumadin. Today he returns for a duplex scan of the graft and check ABIs.  Past Medical History  Diagnosis Date  . Hyperlipidemia   . Gout   . Benign prostatic hypertrophy   . Atrial fibrillation     sees Dr. Henry Smith  . Chronic systolic dysfunction of left ventricle     a. mixed ischemic and nonischemic CM,  EF 35%. b. s/p AICD implantation.  . CHF (congestive heart failure)   . PAD (peripheral artery disease)     Severe by PV angiogram 09/2011  . ED (erectile dysfunction)   . Renal artery stenosis     s/p stenting 2009  . Diabetes mellitus     sees Dr. Ellison   . Arthritis   . Pacemaker     medtronic  . CAD (coronary artery disease)     a. s/p mid LAD stenting with DES 2008 Dr. Henry Smith  . ICD (implantable cardiac defibrillator) in place     medtronic, Dr. Allred  . ICD (implantable cardiac defibrillator) in place     due for check in Feb/2013  . Sleep apnea     hx of "had surgery for"  . Hypertension     sees Dr. S. Fry    History  Substance Use Topics  . Smoking status: Never Smoker   . Smokeless tobacco: Never Used     Comment: 1-2 cigars when golfing  . Alcohol Use: 1.5 oz/week    3 drink(s) per week    Family History  Problem Relation Age of Onset  . Cancer      breast/fhx  . Heart disease      fhx  . Diabetes Neg Hx   . Cancer Father     Allergies  Allergen Reactions  . Other Other (See Comments)    Plastic tape tears skin    Current outpatient prescriptions:carvedilol (COREG) 6.25 MG tablet, Take 1 tablet (6.25 mg total) by mouth 2 (two) times daily with a meal., Disp: 180 tablet, Rfl: 3;  finasteride (PROPECIA) 1 MG tablet, Take 1 mg by mouth  daily., Disp: , Rfl: ;  furosemide (LASIX) 40 MG tablet, Take 1 tablet (40 mg total) by mouth daily., Disp: 90 tablet, Rfl: 3;  gabapentin (NEURONTIN) 300 MG capsule, Take 300 mg by mouth 2 (two) times daily., Disp: , Rfl:  glucose blood test strip, One touch ultra, test 2 times per day and diagnosis code is 250.00, Disp: 100 each, Rfl: 6;  insulin aspart protamine-insulin aspart (NOVOLOG 70/30) (70-30) 100 UNIT/ML injection, Inject 10 Units into the skin 2 (two) times daily with a meal. 10 units with breakfast, and 10 units with the evening meal, Disp: , Rfl: ;  losartan (COZAAR) 100 MG tablet, Take 1 tablet (100 mg total) by mouth daily., Disp: 90 tablet, Rfl: 3 NITROSTAT 0.4 MG SL tablet, Place 0.4 mg under the tongue every 5 (five) minutes as needed. For chest pain, max 3 doses, Disp: , Rfl: ;  warfarin (COUMADIN) 5 MG tablet, Take 0.5 tablets (2.5 mg total) by mouth daily at 6 PM., Disp: 90 tablet, Rfl: 0;  Acetaminophen (TYLENOL PO), Take 1-2 tablets by mouth every 6 (six)   hours as needed. pain, Disp: , Rfl:  atorvastatin (LIPITOR) 20 MG tablet, Take 1 tablet (20 mg total) by mouth daily., Disp: 90 tablet, Rfl: 3;  cephALEXin (KEFLEX) 500 MG capsule, , Disp: , Rfl: ;  gabapentin (NEURONTIN) 300 MG capsule, TAKE ONE CAPSULE 3 TIMES A DAY, Disp: 90 capsule, Rfl: 1;  HYDROcodone-acetaminophen (NORCO) 10-325 MG per tablet, Take 1 tablet by mouth every 6 (six) hours as needed for pain., Disp: 120 tablet, Rfl: 1 oxycodone-acetaminophen (PERCOCET) 2.5-325 MG per tablet, Take 1 tablet by mouth every 4 (four) hours as needed. For pain, Disp: 30 tablet, Rfl: 0;  vitamin E 1000 UNIT capsule, Take 1,000 Units by mouth daily. , Disp: , Rfl:   BP 131/79  Pulse 69  Resp 16  Ht 5' 8" (1.727 m)  Wt 183 lb (83.008 kg)  BMI 27.83 kg/m2  SpO2 100%  Body mass index is 27.83 kg/(m^2).           Review of Systems denies chest pain, dyspnea on exertion, PND, orthopnea    Objective:   Physical Exam blood  pressure 131/79 heart rate 69 respirations 16 General well-developed well-nourished male in no apparent stress alert and oriented x3 Lungs no rhonchi or wheezing Left leg with 2-3+ pulse in the femoral to anterior tibial vein graft and to posterior cells pedis pulse palpable.  Today I ordered a duplex scan of the left lower extremity vein graft. Of concern is a focal area of narrowing in the proximal portion of the graft with a velocity of 462 cm a second. There is also a focal area in the distal portion of the vein graft 236 cm/s. ABI is 0.80    Assessment:     Schedule him to the left leg via right femoral approach by Dr. Brabham on Tuesday, May 6 with possible angioplasty of vein graft stenosis or surgical treatment if indicated    Plan:     Procedure scheduled for next Tuesday, May 6 by Dr. Brabham-suspect vein graft stenosis      

## 2012-06-30 NOTE — Patient Instructions (Addendum)
Resume the evening of the procedure. Recheck in 2 weeks to be sure you remain stable in lieu of he high reading prior to this visit.   Anticoagulation Dose Instructions as of 06/30/2012     [name] Tue Wed Thu Fri Sat   New Dose 2.5 mg 2.5 mg 0 mg 2.5 mg 0 mg 2.5 mg 2.5 mg    Description       Resume the evening of the procedure. Recheck in 2 weeks to be sure you remain stable in lieu of he high reading prior to this visit.

## 2012-07-02 ENCOUNTER — Encounter (HOSPITAL_COMMUNITY): Payer: Self-pay | Admitting: Pharmacy Technician

## 2012-07-06 MED ORDER — SODIUM CHLORIDE 0.9 % IV SOLN
INTRAVENOUS | Status: DC
  Administered 2012-07-07: 09:00:00 via INTRAVENOUS

## 2012-07-07 ENCOUNTER — Encounter (HOSPITAL_COMMUNITY)
Admission: RE | Disposition: A | Discharge status: Home / Self Care | Payer: Self-pay | Source: Ambulatory Visit | Attending: Surgery

## 2012-07-07 ENCOUNTER — Ambulatory Visit (HOSPITAL_COMMUNITY)
Admission: RE | Admit: 2012-07-07 | Discharge: 2012-07-07 | Disposition: A | Discharge status: Home / Self Care | Payer: Medicare Other | Source: Ambulatory Visit | Attending: Surgery | Admitting: Surgery

## 2012-07-07 ENCOUNTER — Telehealth: Payer: Self-pay | Admitting: Vascular Surgery

## 2012-07-07 ENCOUNTER — Other Ambulatory Visit: Payer: Self-pay | Admitting: *Deleted

## 2012-07-07 DIAGNOSIS — Z9109 Other allergy status, other than to drugs and biological substances: Secondary | ICD-10-CM | POA: Insufficient documentation

## 2012-07-07 DIAGNOSIS — Z79899 Other long term (current) drug therapy: Secondary | ICD-10-CM | POA: Insufficient documentation

## 2012-07-07 DIAGNOSIS — I10 Essential (primary) hypertension: Secondary | ICD-10-CM | POA: Insufficient documentation

## 2012-07-07 DIAGNOSIS — E785 Hyperlipidemia, unspecified: Secondary | ICD-10-CM | POA: Insufficient documentation

## 2012-07-07 DIAGNOSIS — N529 Male erectile dysfunction, unspecified: Secondary | ICD-10-CM | POA: Insufficient documentation

## 2012-07-07 DIAGNOSIS — E119 Type 2 diabetes mellitus without complications: Secondary | ICD-10-CM | POA: Insufficient documentation

## 2012-07-07 DIAGNOSIS — I509 Heart failure, unspecified: Secondary | ICD-10-CM | POA: Insufficient documentation

## 2012-07-07 DIAGNOSIS — I4891 Unspecified atrial fibrillation: Secondary | ICD-10-CM | POA: Insufficient documentation

## 2012-07-07 DIAGNOSIS — G4730 Sleep apnea, unspecified: Secondary | ICD-10-CM | POA: Insufficient documentation

## 2012-07-07 DIAGNOSIS — N400 Enlarged prostate without lower urinary tract symptoms: Secondary | ICD-10-CM | POA: Insufficient documentation

## 2012-07-07 DIAGNOSIS — I70409 Unspecified atherosclerosis of autologous vein bypass graft(s) of the extremities, unspecified extremity: Secondary | ICD-10-CM | POA: Insufficient documentation

## 2012-07-07 DIAGNOSIS — I739 Peripheral vascular disease, unspecified: Secondary | ICD-10-CM

## 2012-07-07 DIAGNOSIS — M129 Arthropathy, unspecified: Secondary | ICD-10-CM | POA: Insufficient documentation

## 2012-07-07 DIAGNOSIS — Z794 Long term (current) use of insulin: Secondary | ICD-10-CM | POA: Insufficient documentation

## 2012-07-07 DIAGNOSIS — Z7901 Long term (current) use of anticoagulants: Secondary | ICD-10-CM | POA: Insufficient documentation

## 2012-07-07 DIAGNOSIS — I70219 Atherosclerosis of native arteries of extremities with intermittent claudication, unspecified extremity: Secondary | ICD-10-CM

## 2012-07-07 DIAGNOSIS — I701 Atherosclerosis of renal artery: Secondary | ICD-10-CM | POA: Insufficient documentation

## 2012-07-07 DIAGNOSIS — I428 Other cardiomyopathies: Secondary | ICD-10-CM | POA: Insufficient documentation

## 2012-07-07 DIAGNOSIS — I5022 Chronic systolic (congestive) heart failure: Secondary | ICD-10-CM | POA: Insufficient documentation

## 2012-07-07 HISTORY — PX: LOWER EXTREMITY ANGIOGRAM: SHX5508

## 2012-07-07 LAB — POCT I-STAT, CHEM 8
Calcium, Ion: 1.14 mmol/L (ref 1.13–1.30)
Creatinine, Ser: 2.3 mg/dL — ABNORMAL HIGH (ref 0.50–1.35)
Glucose, Bld: 149 mg/dL — ABNORMAL HIGH (ref 70–99)
HCT: 43 % (ref 39.0–52.0)
Hemoglobin: 14.6 g/dL (ref 13.0–17.0)
TCO2: 23 mmol/L (ref 0–100)

## 2012-07-07 LAB — GLUCOSE, CAPILLARY: Glucose-Capillary: 113 mg/dL — ABNORMAL HIGH (ref 70–99)

## 2012-07-07 LAB — PROTIME-INR
INR: 1.18 (ref 0.00–1.49)
Prothrombin Time: 14.8 seconds (ref 11.6–15.2)

## 2012-07-07 SURGERY — ANGIOGRAM, LOWER EXTREMITY
Anesthesia: LOCAL

## 2012-07-07 MED ORDER — LIDOCAINE HCL (PF) 1 % IJ SOLN
INTRAMUSCULAR | Status: AC
  Filled 2012-07-07: qty 30

## 2012-07-07 MED ORDER — SODIUM CHLORIDE 0.9 % IV SOLN: 1.0000 mL/kg/h | INTRAVENOUS | Status: DC

## 2012-07-07 MED ORDER — MIDAZOLAM HCL 2 MG/2ML IJ SOLN
INTRAMUSCULAR | Status: AC
  Filled 2012-07-07: qty 2

## 2012-07-07 MED ORDER — ONDANSETRON HCL 4 MG/2ML IJ SOLN: 4.0000 mg | Freq: Four times a day (QID) | INTRAMUSCULAR | Status: DC | PRN

## 2012-07-07 MED ORDER — HEPARIN (PORCINE) IN NACL 2-0.9 UNIT/ML-% IJ SOLN
INTRAMUSCULAR | Status: AC
  Filled 2012-07-07: qty 1000

## 2012-07-07 MED ORDER — FENTANYL CITRATE 0.05 MG/ML IJ SOLN
INTRAMUSCULAR | Status: AC
  Filled 2012-07-07: qty 2

## 2012-07-07 MED ORDER — ACETAMINOPHEN 325 MG PO TABS: 650.0000 mg | ORAL_TABLET | ORAL | Status: DC | PRN

## 2012-07-07 MED ORDER — HEPARIN SODIUM (PORCINE) 1000 UNIT/ML IJ SOLN
INTRAMUSCULAR | Status: AC
  Filled 2012-07-07: qty 1

## 2012-07-07 NOTE — H&P (View-Only) (Signed)
Subjective:     Patient ID: [name], male   DOB: 02-13-1935, 78 y.o.   MRN: [id number]  HPI this 78 year old male returns 3 months post redo left femoral to anterior tibial vein graft using saphenous vein from the contralateral right leg. Patient denies claudication symptoms. He is on chronic Coumadin. Today he returns for a duplex scan of the graft and check ABIs.  Past Medical History  Diagnosis Date  . Hyperlipidemia   . Gout   . Benign prostatic hypertrophy   . Atrial fibrillation     sees Dr. Verdis Prime  . Chronic systolic dysfunction of left ventricle     a. mixed ischemic and nonischemic CM,  EF 35%. b. s/p AICD implantation.  . CHF (congestive heart failure)   . PAD (peripheral artery disease)     Severe by PV angiogram 09/2011  . ED (erectile dysfunction)   . Renal artery stenosis     s/p stenting 2009  . Diabetes mellitus     sees Dr. Everardo All   . Arthritis   . Pacemaker     medtronic  . CAD (coronary artery disease)     a. s/p mid LAD stenting with DES 2008 Dr. Verdis Prime  . ICD (implantable cardiac defibrillator) in place     medtronic, Dr. Johney Frame  . ICD (implantable cardiac defibrillator) in place     due for check in Feb/2013  . Sleep apnea     hx of "had surgery for"  . Hypertension     sees Dr. Claris Che    History  Substance Use Topics  . Smoking status: Never Smoker   . Smokeless tobacco: Never Used     Comment: 1-2 cigars when golfing  . Alcohol Use: 1.5 oz/week    3 drink(s) per week    Family History  Problem Relation Age of Onset  . Cancer      breast/fhx  . Heart disease      fhx  . Diabetes Neg Hx   . Cancer Father     Allergies  Allergen Reactions  . Other Other (See Comments)    Plastic tape tears skin    Current outpatient prescriptions:carvedilol (COREG) 6.25 MG tablet, Take 1 tablet (6.25 mg total) by mouth 2 (two) times daily with a meal., Disp: 180 tablet, Rfl: 3;  finasteride (PROPECIA) 1 MG tablet, Take 1 mg by mouth  daily., Disp: , Rfl: ;  furosemide (LASIX) 40 MG tablet, Take 1 tablet (40 mg total) by mouth daily., Disp: 90 tablet, Rfl: 3;  gabapentin (NEURONTIN) 300 MG capsule, Take 300 mg by mouth 2 (two) times daily., Disp: , Rfl:  glucose blood test strip, One touch ultra, test 2 times per day and diagnosis code is 250.00, Disp: 100 each, Rfl: 6;  insulin aspart protamine-insulin aspart (NOVOLOG 70/30) (70-30) 100 UNIT/ML injection, Inject 10 Units into the skin 2 (two) times daily with a meal. 10 units with breakfast, and 10 units with the evening meal, Disp: , Rfl: ;  losartan (COZAAR) 100 MG tablet, Take 1 tablet (100 mg total) by mouth daily., Disp: 90 tablet, Rfl: 3 NITROSTAT 0.4 MG SL tablet, Place 0.4 mg under the tongue every 5 (five) minutes as needed. For chest pain, max 3 doses, Disp: , Rfl: ;  warfarin (COUMADIN) 5 MG tablet, Take 0.5 tablets (2.5 mg total) by mouth daily at 6 PM., Disp: 90 tablet, Rfl: 0;  Acetaminophen (TYLENOL PO), Take 1-2 tablets by mouth every 6 (six)  hours as needed. pain, Disp: , Rfl:  atorvastatin (LIPITOR) 20 MG tablet, Take 1 tablet (20 mg total) by mouth daily., Disp: 90 tablet, Rfl: 3;  cephALEXin (KEFLEX) 500 MG capsule, , Disp: , Rfl: ;  gabapentin (NEURONTIN) 300 MG capsule, TAKE ONE CAPSULE 3 TIMES A DAY, Disp: 90 capsule, Rfl: 1;  HYDROcodone-acetaminophen (NORCO) 10-325 MG per tablet, Take 1 tablet by mouth every 6 (six) hours as needed for pain., Disp: 120 tablet, Rfl: 1 oxycodone-acetaminophen (PERCOCET) 2.5-325 MG per tablet, Take 1 tablet by mouth every 4 (four) hours as needed. For pain, Disp: 30 tablet, Rfl: 0;  vitamin E 1000 UNIT capsule, Take 1,000 Units by mouth daily. , Disp: , Rfl:   BP 131/79  Pulse 69  Resp 16  Ht 5\' 8"  (1.727 m)  Wt 183 lb (83.008 kg)  BMI 27.83 kg/m2  SpO2 100%  Body mass index is 27.83 kg/(m^2).           Review of Systems denies chest pain, dyspnea on exertion, PND, orthopnea    Objective:   Physical Exam blood  pressure 131/79 heart rate 69 respirations 16 General well-developed well-nourished male in no apparent stress alert and oriented x3 Lungs no rhonchi or wheezing Left leg with 2-3+ pulse in the femoral to anterior tibial vein graft and to posterior cells pedis pulse palpable.  Today I ordered a duplex scan of the left lower extremity vein graft. Of concern is a focal area of narrowing in the proximal portion of the graft with a velocity of 462 cm a second. There is also a focal area in the distal portion of the vein graft 236 cm/s. ABI is 0.80    Assessment:     Schedule him to the left leg via right femoral approach by Dr. Myra Gianotti on Tuesday, May 6 with possible angioplasty of vein graft stenosis or surgical treatment if indicated    Plan:     Procedure scheduled for next Tuesday, May 6 by Dr. Lilli Light vein graft stenosis

## 2012-07-07 NOTE — Interval H&P Note (Signed)
[facility]  History and Physical Update  The patient was interviewed and re-examined.  The patient's previous History and Physical has been reviewed and is unchanged from Dr. Candie Chroman consult on: 06/30/12.  There is no change in the plan of care: LLE angiogram, possible fem-AT bypass intervention.  Leonides Sake, MD [facility] Office: [phone] Pager: [phone]  07/07/2012, 9:33 AM

## 2012-07-07 NOTE — Op Note (Signed)
OPERATIVE NOTE   PROCEDURE: 1.  Right common femoral artery cannulation under ultrasound guidance 2.  Third order arterial selection 3.  Left leg runoff with carbon dioxide and contrast 4.  Angioplasty of left femoral to anterior tibial bypass x 3 (3 mm x 40 mm Angiosculpt, 4 mm x 120 mm, 3 mm x 120 mm)  PRE-OPERATIVE DIAGNOSIS: Worsening flow to fem-AT bypass  POST-OPERATIVE DIAGNOSIS: same as above   SURGEON: Leonides Sake, MD  ANESTHESIA: conscious sedation  ESTIMATED BLOOD LOSS: 50 cc  CONTRAST: 23 cc  FINDING(S): High grade proximal conduit stenosis: resolved with angioplasty Tapered small distal vein with patent distal anastomosis  Patent anterior tibial artery  Distal profunda femoral artery branch high grade stenosis  SPECIMEN(S):  none  INDICATIONS:   [name] is a 78 y.o. male who presents with worsening ABI and duplex studies suggestive of a proximal and distal vein bypass stenosis.  The patient presents for: left leg angiogram with possible intervention.  I discussed with the patient the nature of angiographic procedures, especially the limited patencies of any endovascular intervention.  The patient is aware of that the risks of an angiographic procedure include but are not limited to: bleeding, infection, access site complications, renal failure, embolization, rupture of vessel, dissection, possible need for emergent surgical intervention, possible need for surgical procedures to treat the patient's pathology, and stroke and death.  The patient is aware of the risks and agrees to proceed.  DESCRIPTION: After full informed consent was obtained from the patient, the patient was brought back to the angiography suite.  The patient was placed supine upon the angiography table and connected to monitoring equipment.  The patient was then given conscious sedation, the amounts of which are documented in the patient's chart.  The patient was prepped and drape in the standard  fashion for an angiographic procedure.  At this point, attention was turned to the right groin.  Under ultrasound guidance, the right common femoral artery was cannulated with a micropuncture needle.  The microwire was advanced into the iliac arterial system.  The needle was exchanged for a microsheath, which was loaded into the common femoral artery over the wire.  The microwire was exchanged for a [facility] wire which was advanced into the aorta.  The microsheath was then exchanged for a 5-Fr sheath which was loaded into the common femoral artery. The [facility] wire was replaced in the catheter, and using the Waterflow and Omniflush catheter, the left common iliac artery was selected.  The catheter and wire were advanced into the common femoral artery.  The wire was removed and the catheter was connected to the carbon dioxide circuit.  Hand injections were completed from the femoral bifurcation down to the distal anastomosis, where the carbon dioxide was no longer effective.  I then selected out the bypass conduit with a glide wire and exchanged the wire for a Rosen wire.  The wire was exchanged for a Rosen wire.  The patient's right femoral sheath was exchanged for a 6-Fr Ansel sheath, which was lodged in the left common femoral artery.  The dilator was removed.  The patient was given 7000 units of Heparin intravenously, which was a therapeutic bolus.  A hand injection of contrast was completed which demonstrated diversion of the blood flow into the profunda femoral artery due to hemodynamically significant stenosis in the bypass conduit.  I exchanged the wire for a 0.014" Spartacore which was lodged in the mid-bypass conduit.  I loaded a 3  mm x 40 mm Angiosculpt and inflated it to 8 atm for 1 minute twice, treating the proximal 6-7 mm of the bypass conduit.  I exchanged the Angiosculpt for an end-hole catheter and lodged it into the proximal vein conduit.  Hand injection demonstrated limited transient distally, so I felt  there was additional stenoses in this bypass.  I exchanged the catheter for a 4 mm x 120 mm angioplasty balloon and inflated it to 8 atm to treat the proximal vein conduit.  I then exchanged this for a 3 mm x 120 mm balloon.  I steered the Spartacore into the anterior tibial artery.  I then inflated the balloon to profile (6-8 atm,  1 minute) thoughout the rest of the conduit, down to the level of knee.   I exchanged the balloon for a 0.035" Quickcross catheter.  Hand injections demonstrated a single distal stenosis distally that was <30% and the distal vein was roughly the size of the anterior tibial artery, 2-2.5 mm.  I then exchanged the catheter for the 3 mm x 120 mm balloon which I centered around the distal vein conduit, extending slightly into the anterior tibial artery.  This balloon was inflated to 8 atm for 1 minute.  The balloon was deflated and exchanged for the Quickcross catheter which I used to image the vein bypass sequentially.  Finally, I pulled the Quickcross and did a completion injection at the level of the femoral bifurcation.  This image demonstrated contrast flow into the bypass conduit that was not present before.  The sheath was aspirated.  No clots were present and the sheath was reloaded with heparinized saline.  I pulled the sheath back into the right external iliac artery.    COMPLICATIONS: none  CONDITION: stable   Leonides Sake, MD [facility] Office: [phone] Pager: [phone]  07/07/2012, 12:45 PM

## 2012-07-07 NOTE — Telephone Encounter (Addendum)
Message copied by Rosalyn Charters on Tue Jul 07, 2012  3:31 PM ------      Message from: Sharee Pimple      Created: Tue Jul 07, 2012  1:35 PM      Regarding: schedule                   ----- Message -----         From: Melene Plan, RN         Sent: 07/07/2012   1:13 PM           To: Sharee Pimple, CMA, [facility]                        ----- Message -----         From: Fransisco Hertz, MD         Sent: 07/07/2012   1:07 PM           To: Reuel Derby, Melene Plan, RN            [name]      [id number]      June 30, 1934            PROCEDURE:      1.  Right common femoral artery cannulation under ultrasound guidance      2.  Third order arterial selection      3.  Left leg runoff with carbon dioxide and contrast      4.  Angioplasty of left femoral to anterior tibial bypass x 3 (3 mm x 40 mm Angiosculpt, 4 mm x 120 mm, 3 mm x 120 mm)            Follow-up: Dr. Hart Rochester, 2-4 weeks            Orders: L ABI ------  notified patient of fu appt. with dr. Hart Rochester on 08-11-12 10 am as per staff message 07-07-12

## 2012-07-08 ENCOUNTER — Emergency Department (HOSPITAL_COMMUNITY): Payer: Medicare Other

## 2012-07-08 ENCOUNTER — Encounter (HOSPITAL_COMMUNITY): Payer: Self-pay | Admitting: Radiology

## 2012-07-08 ENCOUNTER — Inpatient Hospital Stay (HOSPITAL_COMMUNITY)
Admission: EM | Admit: 2012-07-08 | Discharge: 2012-07-09 | DRG: 864 | Disposition: A | Discharge status: Home / Self Care | Payer: Medicare Other | Attending: Internal Medicine | Admitting: Internal Medicine

## 2012-07-08 ENCOUNTER — Telehealth: Payer: Self-pay | Admitting: Vascular Surgery

## 2012-07-08 DIAGNOSIS — Z48812 Encounter for surgical aftercare following surgery on the circulatory system: Secondary | ICD-10-CM

## 2012-07-08 DIAGNOSIS — Z95810 Presence of automatic (implantable) cardiac defibrillator: Secondary | ICD-10-CM

## 2012-07-08 DIAGNOSIS — M129 Arthropathy, unspecified: Secondary | ICD-10-CM | POA: Diagnosis present

## 2012-07-08 DIAGNOSIS — M109 Gout, unspecified: Secondary | ICD-10-CM | POA: Diagnosis present

## 2012-07-08 DIAGNOSIS — R410 Disorientation, unspecified: Secondary | ICD-10-CM

## 2012-07-08 DIAGNOSIS — I70219 Atherosclerosis of native arteries of extremities with intermittent claudication, unspecified extremity: Secondary | ICD-10-CM

## 2012-07-08 DIAGNOSIS — I2510 Atherosclerotic heart disease of native coronary artery without angina pectoris: Secondary | ICD-10-CM

## 2012-07-08 DIAGNOSIS — Z7901 Long term (current) use of anticoagulants: Secondary | ICD-10-CM

## 2012-07-08 DIAGNOSIS — I7389 Other specified peripheral vascular diseases: Secondary | ICD-10-CM | POA: Diagnosis present

## 2012-07-08 DIAGNOSIS — I255 Ischemic cardiomyopathy: Secondary | ICD-10-CM

## 2012-07-08 DIAGNOSIS — R509 Fever, unspecified: Principal | ICD-10-CM

## 2012-07-08 DIAGNOSIS — I5022 Chronic systolic (congestive) heart failure: Secondary | ICD-10-CM | POA: Diagnosis present

## 2012-07-08 DIAGNOSIS — I10 Essential (primary) hypertension: Secondary | ICD-10-CM

## 2012-07-08 DIAGNOSIS — I509 Heart failure, unspecified: Secondary | ICD-10-CM | POA: Diagnosis present

## 2012-07-08 DIAGNOSIS — R404 Transient alteration of awareness: Secondary | ICD-10-CM

## 2012-07-08 DIAGNOSIS — M79609 Pain in unspecified limb: Secondary | ICD-10-CM

## 2012-07-08 DIAGNOSIS — Z9861 Coronary angioplasty status: Secondary | ICD-10-CM

## 2012-07-08 DIAGNOSIS — E119 Type 2 diabetes mellitus without complications: Secondary | ICD-10-CM | POA: Diagnosis present

## 2012-07-08 DIAGNOSIS — E785 Hyperlipidemia, unspecified: Secondary | ICD-10-CM | POA: Diagnosis present

## 2012-07-08 DIAGNOSIS — I482 Chronic atrial fibrillation, unspecified: Secondary | ICD-10-CM | POA: Diagnosis present

## 2012-07-08 DIAGNOSIS — N400 Enlarged prostate without lower urinary tract symptoms: Secondary | ICD-10-CM | POA: Diagnosis present

## 2012-07-08 DIAGNOSIS — I2589 Other forms of chronic ischemic heart disease: Secondary | ICD-10-CM | POA: Diagnosis present

## 2012-07-08 DIAGNOSIS — R1084 Generalized abdominal pain: Secondary | ICD-10-CM

## 2012-07-08 DIAGNOSIS — G9340 Encephalopathy, unspecified: Secondary | ICD-10-CM | POA: Diagnosis present

## 2012-07-08 DIAGNOSIS — Z794 Long term (current) use of insulin: Secondary | ICD-10-CM

## 2012-07-08 DIAGNOSIS — I4891 Unspecified atrial fibrillation: Secondary | ICD-10-CM

## 2012-07-08 LAB — CBC WITH DIFFERENTIAL/PLATELET
Eosinophils Absolute: 0 10*3/uL (ref 0.0–0.7)
Hemoglobin: 12.8 g/dL — ABNORMAL LOW (ref 13.0–17.0)
Lymphocytes Relative: 10 % — ABNORMAL LOW (ref 12–46)
Lymphs Abs: 1.4 10*3/uL (ref 0.7–4.0)
MCH: 28.6 pg (ref 26.0–34.0)
Monocytes Relative: 11 % (ref 3–12)
Neutro Abs: 11.3 10*3/uL — ABNORMAL HIGH (ref 1.7–7.7)
Neutrophils Relative %: 79 % — ABNORMAL HIGH (ref 43–77)
RBC: 4.48 MIL/uL (ref 4.22–5.81)
WBC: 14.3 10*3/uL — ABNORMAL HIGH (ref 4.0–10.5)

## 2012-07-08 LAB — COMPREHENSIVE METABOLIC PANEL
AST: 15 U/L (ref 0–37)
BUN: 43 mg/dL — ABNORMAL HIGH (ref 6–23)
BUN: 41 mg/dL — ABNORMAL HIGH (ref 6–23)
CO2: 21 mEq/L (ref 19–32)
CO2: 21 mEq/L (ref 19–32)
Calcium: 8.3 mg/dL — ABNORMAL LOW (ref 8.4–10.5)
Calcium: 8.4 mg/dL (ref 8.4–10.5)
Creatinine, Ser: 2.16 mg/dL — ABNORMAL HIGH (ref 0.50–1.35)
Creatinine, Ser: 2.21 mg/dL — ABNORMAL HIGH (ref 0.50–1.35)
GFR calc Af Amer: 32 mL/min — ABNORMAL LOW (ref >90)
GFR calc non Af Amer: 28 mL/min — ABNORMAL LOW (ref >90)
GFR calc non Af Amer: 27 mL/min — ABNORMAL LOW (ref >90)
Glucose, Bld: 91 mg/dL (ref 70–99)
Total Protein: 6.4 g/dL (ref 6.0–8.3)

## 2012-07-08 LAB — CBC
HCT: 38.1 % — ABNORMAL LOW (ref 39.0–52.0)
Hemoglobin: 13.1 g/dL (ref 13.0–17.0)
MCH: 28.6 pg (ref 26.0–34.0)
MCHC: 34.4 g/dL (ref 30.0–36.0)
MCV: 83.2 fL (ref 78.0–100.0)
RBC: 4.58 MIL/uL (ref 4.22–5.81)

## 2012-07-08 LAB — PROTIME-INR: Prothrombin Time: 15.5 seconds — ABNORMAL HIGH (ref 11.6–15.2)

## 2012-07-08 LAB — GLUCOSE, CAPILLARY
Glucose-Capillary: 85 mg/dL (ref 70–99)
Glucose-Capillary: 90 mg/dL (ref 70–99)

## 2012-07-08 LAB — URINALYSIS, ROUTINE W REFLEX MICROSCOPIC
Glucose, UA: NEGATIVE mg/dL
Ketones, ur: NEGATIVE mg/dL
Leukocytes, UA: NEGATIVE
Protein, ur: 30 mg/dL — AB
Urobilinogen, UA: 0.2 mg/dL (ref 0.0–1.0)

## 2012-07-08 LAB — URINE MICROSCOPIC-ADD ON

## 2012-07-08 MED ORDER — WARFARIN SODIUM 5 MG PO TABS
5.0000 mg | ORAL_TABLET | Freq: Once | ORAL | Status: AC
  Administered 2012-07-08: 5 mg via ORAL
  Filled 2012-07-08: qty 1

## 2012-07-08 MED ORDER — NITROGLYCERIN 0.4 MG SL SUBL: 0.4000 mg | SUBLINGUAL_TABLET | SUBLINGUAL | Status: DC | PRN

## 2012-07-08 MED ORDER — ONDANSETRON HCL 4 MG/2ML IJ SOLN: 4.0000 mg | Freq: Three times a day (TID) | INTRAMUSCULAR | Status: AC | PRN

## 2012-07-08 MED ORDER — ACETAMINOPHEN 650 MG RE SUPP
650.0000 mg | Freq: Once | RECTAL | Status: AC
  Administered 2012-07-08: 650 mg via RECTAL
  Filled 2012-07-08: qty 1

## 2012-07-08 MED ORDER — ONDANSETRON HCL 4 MG PO TABS: 4.0000 mg | ORAL_TABLET | Freq: Four times a day (QID) | ORAL | Status: DC | PRN

## 2012-07-08 MED ORDER — VITAMIN E 45 MG (100 UNIT) PO CAPS
1000.0000 [IU] | ORAL_CAPSULE | ORAL | Status: DC
  Filled 2012-07-08 (×2): qty 2

## 2012-07-08 MED ORDER — DEXTROSE 5 % IV SOLN
2.0000 g | Freq: Once | INTRAVENOUS | Status: DC
  Filled 2012-07-08: qty 2

## 2012-07-08 MED ORDER — CEFEPIME HCL 1 G IJ SOLR
1.0000 g | INTRAMUSCULAR | Status: DC
  Administered 2012-07-08 – 2012-07-09 (×2): 1 g via INTRAVENOUS
  Filled 2012-07-08 (×2): qty 1

## 2012-07-08 MED ORDER — GABAPENTIN 300 MG PO CAPS
300.0000 mg | ORAL_CAPSULE | Freq: Three times a day (TID) | ORAL | Status: DC
  Administered 2012-07-08 (×3): 300 mg via ORAL
  Filled 2012-07-08 (×6): qty 1

## 2012-07-08 MED ORDER — INSULIN ASPART 100 UNIT/ML ~~LOC~~ SOLN
0.0000 [IU] | Freq: Three times a day (TID) | SUBCUTANEOUS | Status: DC
  Administered 2012-07-08: 2 [IU] via SUBCUTANEOUS

## 2012-07-08 MED ORDER — HYDROCODONE-ACETAMINOPHEN 10-325 MG PO TABS: 1.0000 | ORAL_TABLET | Freq: Four times a day (QID) | ORAL | Status: DC | PRN

## 2012-07-08 MED ORDER — VANCOMYCIN HCL 10 G IV SOLR
1250.0000 mg | Freq: Once | INTRAVENOUS | Status: AC
  Administered 2012-07-08: 1250 mg via INTRAVENOUS
  Filled 2012-07-08 (×2): qty 1250

## 2012-07-08 MED ORDER — ACETAMINOPHEN 650 MG RE SUPP: 650.0000 mg | Freq: Four times a day (QID) | RECTAL | Status: DC | PRN

## 2012-07-08 MED ORDER — ACETAMINOPHEN 325 MG PO TABS: 650.0000 mg | ORAL_TABLET | Freq: Four times a day (QID) | ORAL | Status: DC | PRN

## 2012-07-08 MED ORDER — FINASTERIDE 1 MG PO TABS: 1.0000 mg | ORAL_TABLET | Freq: Every day | ORAL | Status: DC

## 2012-07-08 MED ORDER — SODIUM CHLORIDE 0.9 % IV SOLN
INTRAVENOUS | Status: AC
  Administered 2012-07-08: 08:00:00 via INTRAVENOUS

## 2012-07-08 MED ORDER — CARVEDILOL 6.25 MG PO TABS
6.2500 mg | ORAL_TABLET | Freq: Two times a day (BID) | ORAL | Status: DC
  Administered 2012-07-08 – 2012-07-09 (×3): 6.25 mg via ORAL
  Filled 2012-07-08 (×5): qty 1

## 2012-07-08 MED ORDER — WARFARIN - PHARMACIST DOSING INPATIENT: Freq: Every day | Status: DC

## 2012-07-08 MED ORDER — VANCOMYCIN HCL IN DEXTROSE 1-5 GM/200ML-% IV SOLN
1000.0000 mg | INTRAVENOUS | Status: DC
  Administered 2012-07-09: 1000 mg via INTRAVENOUS
  Filled 2012-07-08: qty 200

## 2012-07-08 MED ORDER — ONDANSETRON HCL 4 MG/2ML IJ SOLN: 4.0000 mg | Freq: Four times a day (QID) | INTRAMUSCULAR | Status: DC | PRN

## 2012-07-08 MED ORDER — LOSARTAN POTASSIUM 50 MG PO TABS
100.0000 mg | ORAL_TABLET | Freq: Every day | ORAL | Status: DC
Start: 2012-07-08 — End: 2012-07-09
  Administered 2012-07-08: 100 mg via ORAL
  Filled 2012-07-08 (×2): qty 2

## 2012-07-08 MED ORDER — INSULIN ASPART PROT & ASPART (70-30 MIX) 100 UNIT/ML ~~LOC~~ SUSP
10.0000 [IU] | Freq: Two times a day (BID) | SUBCUTANEOUS | Status: DC
  Administered 2012-07-08 – 2012-07-09 (×3): 10 [IU] via SUBCUTANEOUS
  Filled 2012-07-08: qty 10

## 2012-07-08 MED ORDER — SODIUM CHLORIDE 0.9 % IJ SOLN
3.0000 mL | Freq: Two times a day (BID) | INTRAMUSCULAR | Status: DC
  Administered 2012-07-08: 3 mL via INTRAVENOUS

## 2012-07-08 NOTE — Telephone Encounter (Addendum)
Message copied by Rosalyn Charters on Wed Jul 08, 2012 12:55 PM ------      Message from: Melene Plan      Created: Wed Jul 08, 2012 11:26 AM                   ----- Message -----         From: Fransisco Hertz, MD         Sent: 07/08/2012   8:26 AM           To: Melene Plan, RN            [name]      [phone]      [birth year]            Change to prior msg:            Dr. Hart Rochester wants follow up in 6 weeks with L ABI and LLE  arterial duplex  ------  notified patient of change in appt. time from 10:00 to 1pm

## 2012-07-08 NOTE — ED Notes (Signed)
Pt states that he could try to go to the restroom. Pt assisted into wheellchiar and wheeled to room 8.

## 2012-07-08 NOTE — H&P (Signed)
[facility] History and Physical  [name] ZOX:[id number] DOB: 17-Feb-1935 DOA: 07/08/2012  Referring physician: Dr. Rulon Abide. PCP: Nelwyn Salisbury, MD  Specialists: Dr. Imogene Burn vascular surgeon.                       Dr. Verdis Prime cardiologist.  Chief Complaint: Fever.  HPI: [name] is a 78 y.o. male with history of peripheral vascular disease who has had angioplasty yesterday of the lower extremity and had gone home and within one hour started experiencing fever chills and patient eventually became confused. Patient did not have any nausea vomiting abdominal pain diarrhea chest pain or any productive cough. EMS was called and patient was found to have a fever of 102F. Patient was brought to the ER. Since patient is confused CT was done which was negative for anything acute. Cultures were obtained and patient was started empirically on vancomycin and ceftriaxone. Patient fever came down and patient became more alert awake oriented. Patient is nonfocal. At this time patient has been admitted for further observation.  Review of Systems: As presented in the history of presenting illness, rest negative.  Past Medical History  Diagnosis Date  . Hyperlipidemia   . Gout   . Benign prostatic hypertrophy   . Atrial fibrillation     sees Dr. Verdis Prime  . Chronic systolic dysfunction of left ventricle     a. mixed ischemic and nonischemic CM,  EF 35%. b. s/p AICD implantation.  . CHF (congestive heart failure)   . PAD (peripheral artery disease)     Severe by PV angiogram 09/2011  . ED (erectile dysfunction)   . Renal artery stenosis     s/p stenting 2009  . Diabetes mellitus     sees Dr. Everardo All   . Arthritis   . Pacemaker     medtronic  . CAD (coronary artery disease)     a. s/p mid LAD stenting with DES 2008 Dr. Verdis Prime  . ICD (implantable cardiac defibrillator) in place     medtronic, Dr. Johney Frame  . ICD (implantable cardiac defibrillator) in place     due for  check in Feb/2013  . Sleep apnea     hx of "had surgery for"  . Hypertension     sees Dr. Claris Che   Past Surgical History  Procedure Laterality Date  . Turp vaporization    . Cardiac defibrillator placement  12/26/09    pacemaker combo  . Cervical epidural injection  2013  . Cardiac catheterization    . Coronary angioplasty    . Femoral-tibial bypass graft  09/25/2011    Procedure: BYPASS GRAFT FEMORAL-TIBIAL ARTERY;  Surgeon: Jonathan Ochoa, MD;  Location: [facility] OR;  Service: Vascular;  Laterality: Left;  Left Femoral - Anterior Tibial Bypass;  saphenous vein graft from left leg  . Intraoperative arteriogram  09/25/2011    Procedure: INTRA OPERATIVE ARTERIOGRAM;  Surgeon: Jonathan Ochoa, MD;  Location: [facility] OR;  Service: Vascular;  Laterality: Left;  . Femoral-tibial bypass graft  02/07/2012    Procedure: BYPASS GRAFT FEMORAL-TIBIAL ARTERY;  Surgeon: Larina Earthly, MD;  Location: [facility] OR;  Service: Vascular;  Laterality: Left;  Thrombectomy Left Femoral - Tibial Bypass Graft  . Embolectomy  02/07/2012    Procedure: EMBOLECTOMY;  Surgeon: Larina Earthly, MD;  Location: [facility] OR;  Service: Vascular;  Laterality: Left;  . Uvulopalatopharyngoplasty      "surgery for sleep apnea"  . Femoral-tibial bypass  graft  04/03/2012    Procedure: BYPASS GRAFT FEMORAL-TIBIAL ARTERY;  Surgeon: Jonathan Ochoa, MD;  Location: [facility] OR;  Service: Vascular;  Laterality: Left;  Redo  . Pace maker  2007   Social History:  reports that he has never smoked. He has never used smokeless tobacco. He reports that he drinks about 1.5 ounces of alcohol per week. He reports that he does not use illicit drugs.  lives at home. where does patient live--  can do ADLs.  Can patient participate in ADLs?  Allergies  Allergen Reactions  . Other Other (See Comments)    Plastic tape tears skin    Family History  Problem Relation Age of Onset  . Cancer      breast/fhx  . Heart disease      fhx  . Diabetes Neg Hx   . Cancer Father        Prior to Admission medications   Medication Sig Start Date End Date Taking? Authorizing Provider  carvedilol (COREG) 6.25 MG tablet Take 1 tablet (6.25 mg total) by mouth 2 (two) times daily with a meal. 05/22/12  Yes Nelwyn Salisbury, MD  finasteride (PROPECIA) 1 MG tablet Take 1 mg by mouth daily.   Yes Historical Provider, MD  furosemide (LASIX) 40 MG tablet Take 1 tablet (40 mg total) by mouth daily. 05/22/12  Yes Nelwyn Salisbury, MD  gabapentin (NEURONTIN) 300 MG capsule Take 300 mg by mouth 3 (three) times daily.    Yes Historical Provider, MD  HYDROcodone-acetaminophen (NORCO) 10-325 MG per tablet Take 1 tablet by mouth every 6 (six) hours as needed for pain. 05/06/12  Yes Nelwyn Salisbury, MD  insulin aspart protamine-insulin aspart (NOVOLOG 70/30) (70-30) 100 UNIT/ML injection Inject 10 Units into the skin 2 (two) times daily with a meal. 10 units with breakfast, and 10 units with the evening meal   Yes Historical Provider, MD  losartan (COZAAR) 100 MG tablet Take 1 tablet (100 mg total) by mouth daily. 05/18/12  Yes Nelwyn Salisbury, MD  NITROSTAT 0.4 MG SL tablet Place 0.4 mg under the tongue every 5 (five) minutes as needed for chest pain. For chest pain, max 3 doses 12/31/11  Yes Historical Provider, MD  vitamin E 1000 UNIT capsule Take 1,000 Units by mouth 3 (three) times a week.    Yes Historical Provider, MD  warfarin (COUMADIN) 5 MG tablet Take 2.5 mg by mouth 2 (two) times daily. 05/21/12  Yes Baker Pierini, FNP   Physical Exam: Filed Vitals:   07/08/12 0224 07/08/12 0315 07/08/12 0430  BP: 141/54 127/46 124/54  Pulse: 73 76 69  Temp: 102.5 F (39.2 C)    TempSrc: Oral    Resp:  23 26  SpO2: 96% 97% 96%     General:  Well-developed well-nourished.   Eyes: Anicteric no pallor.   ENT: No discharge from the ears eyes nose and mouth.   Neck:  no mass felt.   Cardiovascular:  S1-S2 heard.   Respiratory:  no rhonchi or crepitations.  Abdomen: Soft nontender bowel sounds  present.   Skin:  chronic skin changes.   Musculoskeletal:  no edema.   Psychiatric:  appears normal.  Neurologic:  alert awake oriented to time place and person. Moves all extremities.   Labs on Admission:  Basic Metabolic Panel:  Recent Labs Lab 07/07/12 0908 07/08/12 0215  NA 139 135  K 4.6 4.8  CL 107 105  CO2  --  21  GLUCOSE 149* 91  BUN 52* 43*  CREATININE 2.30* 2.16*  CALCIUM  --  8.3*   Liver Function Tests:  Recent Labs Lab 07/08/12 0215  AST 17  ALT 10  ALKPHOS 56  BILITOT 0.6  PROT 6.4  ALBUMIN 3.2*   No results found for this basename: LIPASE, AMYLASE,  in the last 168 hours No results found for this basename: AMMONIA,  in the last 168 hours CBC:  Recent Labs Lab 07/07/12 0908 07/08/12 0215  WBC  --  11.3*  HGB 14.6 13.1  HCT 43.0 38.1*  MCV  --  83.2  PLT  --  135*   Cardiac Enzymes: No results found for this basename: CKTOTAL, CKMB, CKMBINDEX, TROPONINI,  in the last 168 hours  BNP (last 3 results) No results found for this basename: PROBNP,  in the last 8760 hours CBG:  Recent Labs Lab 07/07/12 0844 07/07/12 1421  GLUCAP 136* 113*    Radiological Exams on Admission: Ct Head Wo Contrast  07/08/2012  *RADIOLOGY REPORT*  Clinical Data: 77 year old male with altered mental status. Inappropriate.  CT HEAD WITHOUT CONTRAST  Technique:  Contiguous axial images were obtained from the base of the skull through the vertex without contrast.  Comparison: 09/18/2007.  Findings: Visualized paranasal sinuses and mastoids are clear.  No acute osseous abnormality identified.  Visualized orbits and scalp soft tissues are within normal limits.  Calcified atherosclerosis at the skull base.  Mild generalized cerebral volume loss since 2009.  No ventriculomegaly. No midline shift, mass effect, or evidence of mass lesion.  Minimal to mild subcortical white matter hypodensity is new.  Otherwise stable and normal gray-white matter differentiation. No evidence  of cortically based acute infarction identified.  No acute intracranial hemorrhage identified.  No suspicious intracranial vascular hyperdensity.  IMPRESSION: No acute intracranial abnormality.  Mild generalized volume loss and nonspecific subcortical white matter changes since 2009.   Original Report Authenticated By: Erskine Speed, M.D.    Dg Chest Port 1 View  07/08/2012  *RADIOLOGY REPORT*  Clinical Data: 78 year old male with fever, altered mental status. Recent angioplasty.  PORTABLE CHEST - 1 VIEW  Comparison: 04/01/2012 and earlier.  Findings: AP portable semi upright view at 0256 hours.  Stable left chest three lead cardiac AICD.  Stable cardiac size and mediastinal contours.  Visualized tracheal air column is within normal limits. No pneumothorax or pulmonary edema.  Stable mild blunting of the left costophrenic angle.  No pleural effusion or acute pulmonary opacity.  Multiple EKG leads and wires overlie the chest.  IMPRESSION: No acute cardiopulmonary abnormality.   Original Report Authenticated By: Erskine Speed, M.D.      Assessment/Plan Principal Problem:   Fever Active Problems:   HYPERTENSION   Atrial fibrillation   CAD (coronary artery disease)   Cardiomyopathy, ischemic   Diabetes   1.  Fever - source is not clear. Suspect bacteremia. Continue vancomycin and we will add cefepime. Follow blood cultures and urine cultures.  2.  left lower extremity angioplasty done yesterday. 3.  Acute encephalopathy/delirium probably secondary to fever - presently resolved. CT head is negative and patient is nonfocal.  4.  A. fib presently rate controlled  - continue Coumadin per pharmacy. Patient was off Coumadin for last few days for the procedure done yesterday. As per patient Dr. Imogene Burn had advised him to start Coumadin again. At this time is no different signs of any bleed patient has not complained of any abdominal pain his hemoglobin is stable.  5.  CAD status post stenting - denies any chest  pain. 6. Ischemic cardiomyopathy last EF was 35% - presently I'm holding patient's Lasix for now. Closely follow blood pressure trends and patient's respiratory status. For now and gently hydrating for 10 hours. 7. Hypertension - see #5. Continue Cozaar. 8. Diabetes mellitus type 2 - continue home medications. 9. Chronic kidney disease - creatinine appears at baseline. Closely follow intake output and metabolic panel and daily weights.    Code Status: Full code.   Family Communication:  patient's wife at the bedside.   Disposition Plan:  admit to inpatient.     Jac Romulus N. [facility] Pager [phone].   If 7PM-7AM, please contact night-coverage [email or web address] Password [facility] 07/08/2012, 5:41 AM

## 2012-07-08 NOTE — ED Notes (Addendum)
Per EMS: Pt from home. Wife called EMS due to pt acting inappropriately and "not making any sense when he was talking for about two hours." Pt has hx of DM, BG 80 on arrival. Pt AO x 4, baseline. Pt diaphoretic, warm to touch.  When ambulating at home, pt having difficulty standing up straight. Pt denies pain. NAD noted at this time. Pt DC yesterday from hospital for angioplasty.

## 2012-07-08 NOTE — ED Notes (Signed)
Pt remains AOx 4 but seems confused over things that according to his wife he should know. Ex: knowing where his incision site was for his angioplasty. Site assessed. Area clean, dry, well approximated. No discharge, swelling, redness noted.

## 2012-07-08 NOTE — Progress Notes (Addendum)
ANTIBIOTIC CONSULT NOTE - INITIAL  Pharmacy Consult for vancomycin and cefepime and coumadin  Indication: fever of unknown origin   Allergies  Allergen Reactions  . Other Other (See Comments)    Plastic tape tears skin    Patient Measurements: Weight: 185 lb 6.5 oz (84.1 kg) Adjusted Body Weight:   Vital Signs: Temp: 98.5 F (36.9 C) (05/07 0610) Temp src: Oral (05/07 0610) BP: 112/53 mmHg (05/07 0610) Pulse Rate: 71 (05/07 0610) Intake/Output from previous day:   Intake/Output from this shift:    Labs:  Recent Labs  07/07/12 0908 07/08/12 0215  WBC  --  11.3*  HGB 14.6 13.1  PLT  --  135*  CREATININE 2.30* 2.16*   The CrCl is unknown because both a height and weight (above a minimum accepted value) are required for this calculation. No results found for this basename: VANCOTROUGH, VANCOPEAK, VANCORANDOM, GENTTROUGH, GENTPEAK, GENTRANDOM, TOBRATROUGH, TOBRAPEAK, TOBRARND, AMIKACINPEAK, AMIKACINTROU, AMIKACIN,  in the last 72 hours   Microbiology: No results found for this or any previous visit (from the past 720 hour(s)).  Medical History: Past Medical History  Diagnosis Date  . Hyperlipidemia   . Gout   . Benign prostatic hypertrophy   . Atrial fibrillation     sees Dr. Verdis Prime  . Chronic systolic dysfunction of left ventricle     a. mixed ischemic and nonischemic CM,  EF 35%. b. s/p AICD implantation.  . CHF (congestive heart failure)   . PAD (peripheral artery disease)     Severe by PV angiogram 09/2011  . ED (erectile dysfunction)   . Renal artery stenosis     s/p stenting 2009  . Diabetes mellitus     sees Dr. Everardo All   . Arthritis   . Pacemaker     medtronic  . CAD (coronary artery disease)     a. s/p mid LAD stenting with DES 2008 Dr. Verdis Prime  . ICD (implantable cardiac defibrillator) in place     medtronic, Dr. Johney Frame  . ICD (implantable cardiac defibrillator) in place     due for check in Feb/2013  . Sleep apnea     hx of "had  surgery for"  . Hypertension     sees Dr. Claris Che    Medications:   Assessment: Fever of unknown origin s/p angioplasty of lower extremity with DC home 5/6. vanc and cefepime for empiric coverage INR is 1.25 oa  Goal of Therapy:  Vancomycin trough level 15-20 mcg/ml  Plan:  Vancomycin 1gm q24 hours next dose 0600 5/8  Cefepime 1gm q24h  Coumadin 5 mg at 1800 daily INR follow up.  Janice Coffin 07/08/2012,7:24 AM

## 2012-07-08 NOTE — Progress Notes (Addendum)
[facility]   HPI: [name] is a 78 y.o. male with history of peripheral vascular disease who has had angioplasty yesterday 07/07/2012 of the lower extremity and had gone home and within one hour started experiencing fever chills and patient eventually became confused. Patient did not have any nausea vomiting abdominal pain diarrhea chest pain or any productive cough. EMS was called and patient was found to have a fever of 102F. Patient was brought to the ER. Since patient is confused CT was done which was negative for anything acute. Cultures were obtained and patient was started empirically on vancomycin and ceftriaxone. Patient fever came down and patient became more alert awake oriented. Patient is nonfocal. At this time patient has been admitted for further observation.   We were asked to see him secondary to post procedure confusion and fever.      PROCEDURE:  1. Right common femoral artery cannulation under ultrasound guidance  2. Third order arterial selection  3. Left leg runoff with carbon dioxide and contrast  4. Angioplasty of left femoral to anterior tibial bypass x 3 (3 mm x 40 mm Angiosculpt, 4 mm x 120 mm, 3 mm x 120 mm)      Objective 112/53 71 98.5 F (36.9 C) (Oral) 20 98%  Intake/Output Summary (Last 24 hours) at 07/08/12 1021 Last data filed at 07/08/12 0730  Gross per 24 hour  Intake    360 ml  Output    200 ml  Net    160 ml    Right groin incision clean dry and no erythema or drainage. Distally bilateral LE DP/PT doppler pulses. Heart RRR Lungs CTA  Assessment/Planning: S/P angiogram Angioplasty of left femoral to anterior tibial bypass  Patient is on IV antibiotics without fever and confusion this am.     Clinton Gallant [facility] 07/08/2012 10:21 AM --  Laboratory Lab Results:  Recent Labs  07/08/12 0215 07/08/12 0750  WBC 11.3* 14.3*  HGB 13.1 12.8*  HCT 38.1* 37.4*  PLT 135* 122*   BMET  Recent  Labs  07/08/12 0215 07/08/12 0750  NA 135 136  K 4.8 4.9  CL 105 106  CO2 21 21  GLUCOSE 91 108*  BUN 43* 41*  CREATININE 2.16* 2.21*  CALCIUM 8.3* 8.4    COAG Lab Results  Component Value Date   INR 1.25 07/08/2012   INR 1.18 07/07/2012   INR 2.4 06/30/2012   No results found for this basename: PTT      Agree with above Patient to return to office and see me in 6 weeks with duplex scan of bypass and bilateral arm vein mapping

## 2012-07-08 NOTE — Progress Notes (Signed)
[facility] PROGRESS NOTE  BECKET WECKER AVW:[id number] DOB: August 12, 1934 DOA: 07/08/2012 PCP: Nelwyn Salisbury, MD  Assessment/Plan: 1. Fever - source is not clear. Suspect bacteremia. Continue vancomycin and we will add cefepime. Follow blood cultures and urine cultures.  2. Left lower extremity angioplasty done 07/07/12 3. Acute encephalopathy/delirium probably secondary to fever - presently resolved. CT head is negative and patient is nonfocal.  4. A. fib presently rate controlled - continue Coumadin per pharmacy. Patient was off Coumadin for last few days for the procedure done yesterday. As per patient Dr. Imogene Burn had advised him to start Coumadin again. At this time is no different signs of any bleed patient has not complained of any abdominal pain his hemoglobin is stable.  5. CAD status post stenting - denies any chest pain. 6. Ischemic cardiomyopathy last EF was 35% - presently I'm holding patient's Lasix for now. Closely follow blood pressure trends and patient's respiratory status. For now and gently hydrating for 10 hours. 7. Hypertension - see #5. Continue Cozaar. 8. Diabetes mellitus type 2 - continue home medications. 9. Chronic kidney disease - creatinine appears at baseline. Closely follow intake output and metabolic panel and daily weights.  HPI/Subjective: No complains   Objective: Filed Vitals:   07/08/12 0530 07/08/12 0545 07/08/12 0546 07/08/12 0610  BP: 121/52 130/53  112/53  Pulse: 69 69  71  Temp:   98.6 F (37 C) 98.5 F (36.9 C)  TempSrc:    Oral  Resp: 23 23  20   Weight:    84.1 kg (185 lb 6.5 oz)  SpO2: 97% 97%  98%   No intake or output data in the 24 hours ending 07/08/12 0845 Filed Weights   07/08/12 0610  Weight: 84.1 kg (185 lb 6.5 oz)    Patient Vitals for the past 24 hrs:  BP Temp Temp src Pulse Resp SpO2 Weight  07/08/12 0610 112/53 mmHg 98.5 F (36.9 C) Oral 71 20 98 % 84.1 kg (185 lb 6.5 oz)  07/08/12 0546 - 98.6 F (37 C) - - - - -   07/08/12 0545 130/53 mmHg - - 69 23 97 % -  07/08/12 0530 121/52 mmHg - - 69 23 97 % -  07/08/12 0500 128/56 mmHg - - 69 18 99 % -  07/08/12 0430 124/54 mmHg - - 69 26 96 % -  07/08/12 0315 127/46 mmHg - - 76 23 97 % -  07/08/12 0224 141/54 mmHg 102.5 F (39.2 C) Oral 73 - 96 % -    Exam:   General:  axox3  Cardiovascular: irreg irreg   Respiratory: ctab   Abdomen: soft  Musculoskeletal: intact    Data Reviewed: Basic Metabolic Panel:  Recent Labs Lab 07/07/12 0908 07/08/12 0215  NA 139 135  K 4.6 4.8  CL 107 105  CO2  --  21  GLUCOSE 149* 91  BUN 52* 43*  CREATININE 2.30* 2.16*  CALCIUM  --  8.3*   Liver Function Tests:  Recent Labs Lab 07/08/12 0215  AST 17  ALT 10  ALKPHOS 56  BILITOT 0.6  PROT 6.4  ALBUMIN 3.2*   No results found for this basename: LIPASE, AMYLASE,  in the last 168 hours No results found for this basename: AMMONIA,  in the last 168 hours CBC:  Recent Labs Lab 07/07/12 0908 07/08/12 0215  WBC  --  11.3*  HGB 14.6 13.1  HCT 43.0 38.1*  MCV  --  83.2  PLT  --  135*   Cardiac Enzymes: No results found for this basename: CKTOTAL, CKMB, CKMBINDEX, TROPONINI,  in the last 168 hours BNP (last 3 results) No results found for this basename: PROBNP,  in the last 8760 hours CBG:  Recent Labs Lab 07/07/12 0844 07/07/12 1421 07/08/12 0616  GLUCAP 136* 113* 85    No results found for this or any previous visit (from the past 240 hour(s)).   Studies: Ct Head Wo Contrast  07/08/2012  *RADIOLOGY REPORT*  Clinical Data: 78 year old male with altered mental status. Inappropriate.  CT HEAD WITHOUT CONTRAST  Technique:  Contiguous axial images were obtained from the base of the skull through the vertex without contrast.  Comparison: 09/18/2007.  Findings: Visualized paranasal sinuses and mastoids are clear.  No acute osseous abnormality identified.  Visualized orbits and scalp soft tissues are within normal limits.  Calcified  atherosclerosis at the skull base.  Mild generalized cerebral volume loss since 2009.  No ventriculomegaly. No midline shift, mass effect, or evidence of mass lesion.  Minimal to mild subcortical white matter hypodensity is new.  Otherwise stable and normal gray-white matter differentiation. No evidence of cortically based acute infarction identified.  No acute intracranial hemorrhage identified.  No suspicious intracranial vascular hyperdensity.  IMPRESSION: No acute intracranial abnormality.  Mild generalized volume loss and nonspecific subcortical white matter changes since 2009.   Original Report Authenticated By: Erskine Speed, M.D.    Dg Chest Port 1 View  07/08/2012  *RADIOLOGY REPORT*  Clinical Data: 78 year old male with fever, altered mental status. Recent angioplasty.  PORTABLE CHEST - 1 VIEW  Comparison: 04/01/2012 and earlier.  Findings: AP portable semi upright view at 0256 hours.  Stable left chest three lead cardiac AICD.  Stable cardiac size and mediastinal contours.  Visualized tracheal air column is within normal limits. No pneumothorax or pulmonary edema.  Stable mild blunting of the left costophrenic angle.  No pleural effusion or acute pulmonary opacity.  Multiple EKG leads and wires overlie the chest.  IMPRESSION: No acute cardiopulmonary abnormality.   Original Report Authenticated By: Erskine Speed, M.D.     Scheduled Meds: . carvedilol  6.25 mg Oral BID WC  . ceFEPime (MAXIPIME) IV  1 g Intravenous Q24H  . cefTRIAXone (ROCEPHIN)  IV  2 g Intravenous Once  . finasteride  1 mg Oral Daily  . gabapentin  300 mg Oral TID  . insulin aspart  0-9 Units Subcutaneous TID WC  . insulin aspart protamine- aspart  10 Units Subcutaneous BID WC  . losartan  100 mg Oral Daily  . sodium chloride  3 mL Intravenous Q12H  . [START ON 07/09/2012] vancomycin  1,000 mg Intravenous Q24H  . vitamin E  1,000 Units Oral 3 times weekly  . warfarin  5 mg Oral ONCE-1800  . Warfarin - Pharmacist Dosing  Inpatient   Does not apply q1800   Continuous Infusions: . sodium chloride 75 mL/hr at 07/08/12 0745    Principal Problem:   Fever Active Problems:   HYPERTENSION   Atrial fibrillation   CAD (coronary artery disease)   Cardiomyopathy, ischemic   Diabetes       Maricarmen Braziel  [facility] Pager [phone]. If 7PM-7AM, please contact night-coverage at [email or web address], password [facility] 07/08/2012, 8:45 AM  LOS: 0 days

## 2012-07-08 NOTE — ED Provider Notes (Signed)
History     CSN: [id number]  Arrival date & time 07/08/12  0213   First MD Initiated Contact with Patient 07/08/12 0241      Chief Complaint  Patient presents with  . Altered Mental Status   Level V caveat for altered mental status Patient is a 78 y.o. male presenting with altered mental status.  Altered Mental Status   patient has altered mental status, per his wife, he was acting confused and was giving inappropriate answers to questions. Patient was oriented to himself at home.  Patient has a history of diabetes, hyperlipidemia, peripheral arterial disease for which the patient had a left femoral angioplasty.  Per his wife he was not complaining about any abdominal pain, chest pain, shortness of breath, fevers or chills prior to acting strangely.  Past Medical History  Diagnosis Date  . Hyperlipidemia   . Gout   . Benign prostatic hypertrophy   . Atrial fibrillation     sees Dr. Verdis Prime  . Chronic systolic dysfunction of left ventricle     a. mixed ischemic and nonischemic CM,  EF 35%. b. s/p AICD implantation.  . CHF (congestive heart failure)   . PAD (peripheral artery disease)     Severe by PV angiogram 09/2011  . ED (erectile dysfunction)   . Renal artery stenosis     s/p stenting 2009  . Diabetes mellitus     sees Dr. Everardo All   . Arthritis   . Pacemaker     medtronic  . CAD (coronary artery disease)     a. s/p mid LAD stenting with DES 2008 Dr. Verdis Prime  . ICD (implantable cardiac defibrillator) in place     medtronic, Dr. Johney Frame  . ICD (implantable cardiac defibrillator) in place     due for check in Feb/2013  . Sleep apnea     hx of "had surgery for"  . Hypertension     sees Dr. Claris Che    Past Surgical History  Procedure Laterality Date  . Turp vaporization    . Cardiac defibrillator placement  12/26/09    pacemaker combo  . Cervical epidural injection  2013  . Cardiac catheterization    . Coronary angioplasty    . Femoral-tibial bypass graft   09/25/2011    Procedure: BYPASS GRAFT FEMORAL-TIBIAL ARTERY;  Surgeon: Pryor Ochoa, MD;  Location: [facility] OR;  Service: Vascular;  Laterality: Left;  Left Femoral - Anterior Tibial Bypass;  saphenous vein graft from left leg  . Intraoperative arteriogram  09/25/2011    Procedure: INTRA OPERATIVE ARTERIOGRAM;  Surgeon: Pryor Ochoa, MD;  Location: [facility] OR;  Service: Vascular;  Laterality: Left;  . Femoral-tibial bypass graft  02/07/2012    Procedure: BYPASS GRAFT FEMORAL-TIBIAL ARTERY;  Surgeon: Larina Earthly, MD;  Location: [facility] OR;  Service: Vascular;  Laterality: Left;  Thrombectomy Left Femoral - Tibial Bypass Graft  . Embolectomy  02/07/2012    Procedure: EMBOLECTOMY;  Surgeon: Larina Earthly, MD;  Location: [facility] OR;  Service: Vascular;  Laterality: Left;  . Uvulopalatopharyngoplasty      "surgery for sleep apnea"  . Femoral-tibial bypass graft  04/03/2012    Procedure: BYPASS GRAFT FEMORAL-TIBIAL ARTERY;  Surgeon: Pryor Ochoa, MD;  Location: [facility] OR;  Service: Vascular;  Laterality: Left;  Redo  . Pace maker  2007    Family History  Problem Relation Age of Onset  . Cancer      breast/fhx  . Heart disease  fhx  . Diabetes Neg Hx   . Cancer Father     History  Substance Use Topics  . Smoking status: Never Smoker   . Smokeless tobacco: Never Used     Comment: 1-2 cigars when golfing  . Alcohol Use: 1.5 oz/week    3 drink(s) per week      Review of Systems  Psychiatric/Behavioral: Positive for altered mental status.   Level V caveat for altered mental status Allergies  Other  Home Medications  No current outpatient prescriptions on file.  BP 112/53  Pulse 71  Temp(Src) 98.5 F (36.9 C) (Oral)  Resp 20  Wt 185 lb 6.5 oz (84.1 kg)  BMI 28.2 kg/m2  SpO2 98%  Physical Exam  Nursing notes reviewed.  Electronic medical record reviewed. VITAL SIGNS:   Filed Vitals:   07/08/12 0530 07/08/12 0545 07/08/12 0546 07/08/12 0610  BP: 121/52 130/53  112/53  Pulse: 69 69  71   Temp:   98.6 F (37 C) 98.5 F (36.9 C)  TempSrc:    Oral  Resp: 23 23  20   Weight:    185 lb 6.5 oz (84.1 kg)  SpO2: 97% 97%  98%   CONSTITUTIONAL: Awake, oriented to self, appears diaphoretic and ill HENT: Atraumatic, normocephalic, oral mucosa pink and moist, airway patent. Nares patent without drainage. External ears normal. EYES: Conjunctiva clear, EOMI, PERRLA NECK: Trachea midline, non-tender, supple CARDIOVASCULAR: Normal heart rate, Normal rhythm, No murmurs, rubs, gallops PULMONARY/CHEST: Clear to auscultation, no rhonchi, wheezes, or rales. Symmetrical breath sounds. Non-tender. ABDOMINAL: Non-distended, soft, non-tender - no rebound or guarding.  BS normal. NEUROLOGIC: Non-focal, moving all four extremities, no gross sensory or motor deficits. EXTREMITIES: No clubbing, cyanosis, or edema. Approximately 2 cm incision in the left groin, there is no erythema or drainage, no tenderness to palpation, no pulsatile mass SKIN: Hot and moist, No erythema, No rash  ED Course  Procedures (including critical care time)  Date: 07/08/2012  Rate: 74  Rhythm: Paced rhythm   QRS Axis: normal  Intervals: normal  ST/T Wave abnormalities: normal  Conduction Disutrbances: none  Narrative Interpretation: previously rhythm is not paced, Sgarbossa criteria not met   No ST elevation >= 1mm in a lead with concordant QRS complex No ST depression >= 1mm in ilead V1, V2 or V3 No ST elevation >=45mm in a lead with discordant QRS complex    Labs Reviewed  CBC - Abnormal; Notable for the following:    WBC 11.3 (*)    HCT 38.1 (*)    Platelets 135 (*)    All other components within normal limits  COMPREHENSIVE METABOLIC PANEL - Abnormal; Notable for the following:    BUN 43 (*)    Creatinine, Ser 2.16 (*)    Calcium 8.3 (*)    Albumin 3.2 (*)    GFR calc non Af Amer 28 (*)    GFR calc Af Amer 32 (*)    All other components within normal limits  PROTIME-INR - Abnormal; Notable for the  following:    Prothrombin Time 15.5 (*)    All other components within normal limits  URINALYSIS, ROUTINE W REFLEX MICROSCOPIC - Abnormal; Notable for the following:    Hgb urine dipstick TRACE (*)    Protein, ur 30 (*)    All other components within normal limits  CULTURE, BLOOD (ROUTINE X 2)  CULTURE, BLOOD (ROUTINE X 2)  URINE MICROSCOPIC-ADD ON  GLUCOSE, CAPILLARY  COMPREHENSIVE METABOLIC PANEL  CBC WITH DIFFERENTIAL  Ct Head Wo Contrast  07/08/2012  *RADIOLOGY REPORT*  Clinical Data: 78 year old male with altered mental status. Inappropriate.  CT HEAD WITHOUT CONTRAST  Technique:  Contiguous axial images were obtained from the base of the skull through the vertex without contrast.  Comparison: 09/18/2007.  Findings: Visualized paranasal sinuses and mastoids are clear.  No acute osseous abnormality identified.  Visualized orbits and scalp soft tissues are within normal limits.  Calcified atherosclerosis at the skull base.  Mild generalized cerebral volume loss since 2009.  No ventriculomegaly. No midline shift, mass effect, or evidence of mass lesion.  Minimal to mild subcortical white matter hypodensity is new.  Otherwise stable and normal gray-white matter differentiation. No evidence of cortically based acute infarction identified.  No acute intracranial hemorrhage identified.  No suspicious intracranial vascular hyperdensity.  IMPRESSION: No acute intracranial abnormality.  Mild generalized volume loss and nonspecific subcortical white matter changes since 2009.   Original Report Authenticated By: Erskine Speed, M.D.    Dg Chest Port 1 View  07/08/2012  *RADIOLOGY REPORT*  Clinical Data: 78 year old male with fever, altered mental status. Recent angioplasty.  PORTABLE CHEST - 1 VIEW  Comparison: 04/01/2012 and earlier.  Findings: AP portable semi upright view at 0256 hours.  Stable left chest three lead cardiac AICD.  Stable cardiac size and mediastinal contours.  Visualized tracheal air  column is within normal limits. No pneumothorax or pulmonary edema.  Stable mild blunting of the left costophrenic angle.  No pleural effusion or acute pulmonary opacity.  Multiple EKG leads and wires overlie the chest.  IMPRESSION: No acute cardiopulmonary abnormality.   Original Report Authenticated By: Erskine Speed, M.D.      1. Delirium   2. Fever   3. Atrial fibrillation   4. CAD (coronary artery disease)   5. Cardiomyopathy, ischemic     MDM  [name] is a 78 y.o. male with multiple medical problems presents with altered mental status. Concern for bacteremia with patient fever and altered mental status, we'll search for source of fever. Sheath entry point at right groin appears clean dry and intact, there are no pseudoaneurysms palpated, there is no drainage or erythema to indicate infection.  Patient's white count is only mildly elevated at 11.3, his INR is unremarkable, it was stopped prior to his procedure. Patient does have chronic kidney disease with no significant elevation in his creatinine or change in his GFR. Blood cultures were obtained and the patient was treated with vancomycin empirically with thought for bacteremia secondary to skin flora.  CT of the head is unremarkable, EKG shows a paced rate at 74. Sgarbossa criteria not met..ACS, doubt stroke, patient seems encephalopathic.  On reevaluation, after fluids and antibiotics, patient is alert and oriented x4. He is amnestic to preceding events at home, EMS arrival or acting oddly.  No source of fever identified, with altered mental status and fever, will admit patient to the hospital for further treatment and observation.  D/W hospitalist. Stable         Jones Skene, MD 07/08/12 [phone]

## 2012-07-09 ENCOUNTER — Other Ambulatory Visit: Payer: Self-pay | Admitting: *Deleted

## 2012-07-09 DIAGNOSIS — I739 Peripheral vascular disease, unspecified: Secondary | ICD-10-CM

## 2012-07-09 DIAGNOSIS — Z01810 Encounter for preprocedural cardiovascular examination: Secondary | ICD-10-CM

## 2012-07-09 DIAGNOSIS — I70219 Atherosclerosis of native arteries of extremities with intermittent claudication, unspecified extremity: Secondary | ICD-10-CM

## 2012-07-09 DIAGNOSIS — R1084 Generalized abdominal pain: Secondary | ICD-10-CM

## 2012-07-09 DIAGNOSIS — Z48812 Encounter for surgical aftercare following surgery on the circulatory system: Secondary | ICD-10-CM

## 2012-07-09 DIAGNOSIS — R404 Transient alteration of awareness: Secondary | ICD-10-CM

## 2012-07-09 LAB — CBC
HCT: 34.1 % — ABNORMAL LOW (ref 39.0–52.0)
Platelets: 112 10*3/uL — ABNORMAL LOW (ref 150–400)
RDW: 13.6 % (ref 11.5–15.5)
WBC: 10.3 10*3/uL (ref 4.0–10.5)

## 2012-07-09 LAB — GLUCOSE, CAPILLARY

## 2012-07-09 LAB — BASIC METABOLIC PANEL
Chloride: 107 mEq/L (ref 96–112)
Creatinine, Ser: 2.19 mg/dL — ABNORMAL HIGH (ref 0.50–1.35)
GFR calc Af Amer: 32 mL/min — ABNORMAL LOW (ref >90)
GFR calc non Af Amer: 28 mL/min — ABNORMAL LOW (ref >90)

## 2012-07-09 LAB — PROTIME-INR
INR: 1.3 (ref 0.00–1.49)
Prothrombin Time: 15.9 seconds — ABNORMAL HIGH (ref 11.6–15.2)

## 2012-07-09 MED ORDER — WARFARIN SODIUM 7.5 MG PO TABS
7.5000 mg | ORAL_TABLET | Freq: Once | ORAL | Status: DC
  Filled 2012-07-09: qty 1

## 2012-07-09 NOTE — Progress Notes (Addendum)
[facility]  Subjective  - Patient is feeling well.  Alert and wants to go home.   Objective 122/58 71 98.2 F (36.8 C) (Oral) 19 96%  Intake/Output Summary (Last 24 hours) at 07/09/12 0728 Last data filed at 07/09/12 0300  Gross per 24 hour  Intake    960 ml  Output    775 ml  Net    185 ml   Right groin incision clean dry and no erythema or drainage.  Distally bilateral LE DP/PT doppler pulses.  Left proximal below popliteal incision scab formation.  There is no erythema or drainage. Heart RRR  Lungs CTA   Assessment/Planning: S/P angiogram Angioplasty of left femoral to anterior tibial bypass  Patient is on IV antibiotics without fever and confusion this am. Patient has a follow up appointment with our office in 6 weeks. From a vascular point of view he can be discharged.   Clinton Gallant [facility] 07/09/2012 7:28 AM --  Laboratory Lab Results:  Recent Labs  07/08/12 0215 07/08/12 0750  WBC 11.3* 14.3*  HGB 13.1 12.8*  HCT 38.1* 37.4*  PLT 135* 122*   BMET  Recent Labs  07/08/12 0750 07/09/12 0500  NA 136 139  K 4.9 4.7  CL 106 107  CO2 21 21  GLUCOSE 108* 88  BUN 41* 34*  CREATININE 2.21* 2.19*  CALCIUM 8.4 8.5    COAG Lab Results  Component Value Date   INR 1.30 07/09/2012   INR 1.25 07/08/2012   INR 1.18 07/07/2012   No results found for this basename: PTT      Agree with above-probable DC home today

## 2012-07-09 NOTE — Progress Notes (Signed)
ANTICOAGULATION CONSULT NOTE - Follow Up Consult  Pharmacy Consult for coumadin Indication: atrial fibrillation  Allergies  Allergen Reactions  . Other Other (See Comments)    Plastic tape tears skin    Patient Measurements: Height: 5\' 8"  (172.7 cm) Weight: 188 lb 4.4 oz (85.4 kg) IBW/kg (Calculated) : 68.4   Vital Signs: Temp: 98.2 F (36.8 C) (05/08 0300) Temp src: Oral (05/08 0300) BP: 122/58 mmHg (05/08 0300) Pulse Rate: 71 (05/08 0300)  Labs:  Recent Labs  07/07/12 0855  07/08/12 0215 07/08/12 0750 07/09/12 0500  HGB  --   < > 13.1 12.8* 11.6*  HCT  --   < > 38.1* 37.4* 34.1*  PLT  --   --  135* 122* 112*  LABPROT 14.8  --  15.5*  --  15.9*  INR 1.18  --  1.25  --  1.30  CREATININE  --   < > 2.16* 2.21* 2.19*  < > = values in this interval not displayed.  Estimated Creatinine Clearance: 30.5 ml/min (by C-G formula based on Cr of 2.19).   Medications:  Scheduled:  . carvedilol  6.25 mg Oral BID WC  . ceFEPime (MAXIPIME) IV  1 g Intravenous Q24H  . cefTRIAXone (ROCEPHIN)  IV  2 g Intravenous Once  . gabapentin  300 mg Oral TID  . insulin aspart  0-9 Units Subcutaneous TID WC  . insulin aspart protamine- aspart  10 Units Subcutaneous BID WC  . losartan  100 mg Oral Daily  . sodium chloride  3 mL Intravenous Q12H  . vancomycin  1,000 mg Intravenous Q24H  . vitamin E  1,000 Units Oral 3 times weekly  . [COMPLETED] warfarin  5 mg Oral ONCE-1800  . Warfarin - Pharmacist Dosing Inpatient   Does not apply q1800  . [DISCONTINUED] finasteride  1 mg Oral Daily    Assessment: 78 yo male on coumadin PTA for afib to continue as inpatient. INR today= 1.3 (Patient was off coumadin PTA for a  Procedure).  Patient noted on antibiotics.  Home coumadin dose: 5mg /day  Goal of Therapy:  INR 2-3 Monitor platelets by anticoagulation protocol: Yes   Plan:   -Coumadin 7.5mg  po today -Daily PT/INR  Harland German, Pharm D 07/09/2012 8:55 AM

## 2012-07-09 NOTE — Discharge Summary (Signed)
Physician Discharge Summary  MANNIX KROEKER AVW:[id number] DOB: 1934/07/29 DOA: 07/08/2012  PCP: Nelwyn Salisbury, MD  Admit date: 07/08/2012 Discharge date: 07/09/2012  Time spent: 40  minutes    Discharge Diagnoses:   Fever - one time episode-unclear etiology-resolved prior to discharge  Altered mental status on admission-probably due to high fever-resolved   HYPERTENSION   Atrial fibrillation   CAD (coronary artery disease)   Cardiomyopathy, ischemic   Diabetes Severe peripheral vascular disease - status post recent angioplasty of the left femoral to anterior tibial bypass   Discharge Condition: good  Diet recommendation: diabetic   Filed Weights   07/08/12 0610 07/09/12 0300  Weight: 84.1 kg (185 lb 6.5 oz) 85.4 kg (188 lb 4.4 oz)    History of present illness:  [name] is a 78 y.o. male with history of peripheral vascular disease who has had angioplasty yesterday of the lower extremity and had gone home and within one hour started experiencing fever chills and patient eventually became confused. Patient did not have any nausea vomiting abdominal pain diarrhea chest pain or any productive cough. EMS was called and patient was found to have a fever of 102F. Patient was brought to the ER. Since patient is confused CT was done which was negative for anything acute. Cultures were obtained and patient was started empirically on vancomycin and ceftriaxone. Patient fever came down and patient became more alert awake oriented. Patient is nonfocal. At this time patient has been admitted for further observation.      Hospital Course:  1. Patient was admitted and started empirically on antibiotics. Cultures remained negative and and patient wasn't febrile. Patient felt great and asked if he could be discharged home on May 8. Since cultures were negative the patient was taken off of the antibiotics and discharged home. 2. peripheral vascular disease status post multiple procedures -  patient was seen by vascular surgery and deemed stable from their point of view 3. multiple chronic medical issues including diabetes, chronic disease and hypertension-stable during this admission  Procedures:  None (i.e. Studies not automatically included, echos, thoracentesis, etc; not x-rays)  Consultations:  Vascular surgery  Discharge Exam: Filed Vitals:   07/08/12 1356 07/08/12 2054 07/09/12 0300 07/09/12 0700  BP: 119/73 114/58 122/58   Pulse: 67 56 71   Temp: 98.4 F (36.9 C) 99.3 F (37.4 C) 98.2 F (36.8 C)   TempSrc: Oral Oral Oral   Resp: 18 18 19    Height:    5\' 8"  (1.727 m)  Weight:   85.4 kg (188 lb 4.4 oz)   SpO2: 98% 97% 96%     General: Alert and oriented Cardiovascular: Regular rate and rhythm Respiratory: Clear to auscultation  Discharge Instructions  Discharge Orders   Future Appointments Provider Department Dept Phone   07/14/2012 8:30 AM Baker Pierini, FNP [facility] [phone]   08/11/2012 1:00 PM [facility] [phone]   08/11/2012 1:30 PM [facility] Lab 5 [facility] [phone]   08/11/2012 2:00 PM Pryor Ochoa, MD [facility] [phone]   Future Orders Complete By Expires     Diet - low sodium heart healthy  As directed     Increase activity slowly  As directed         Medication List    TAKE these medications       carvedilol 6.25 MG tablet  Commonly known as:  COREG  Take  1 tablet (6.25 mg total) by mouth 2 (two) times daily with a meal.     finasteride 1 MG tablet  Commonly known as:  PROPECIA  Take 1 mg by mouth daily.     furosemide 40 MG tablet  Commonly known as:  LASIX  Take 1 tablet (40 mg total) by mouth daily.     gabapentin 300 MG capsule  Commonly known as:  NEURONTIN  Take 300 mg by mouth 3 (three) times daily.     HYDROcodone-acetaminophen 10-325 MG per tablet  Commonly known  as:  NORCO  Take 1 tablet by mouth every 6 (six) hours as needed for pain.     insulin aspart protamine- aspart (70-30) 100 UNIT/ML injection  Commonly known as:  NOVOLOG 70/30  Inject 10 Units into the skin 2 (two) times daily with a meal. 10 units with breakfast, and 10 units with the evening meal     losartan 100 MG tablet  Commonly known as:  COZAAR  Take 1 tablet (100 mg total) by mouth daily.     NITROSTAT 0.4 MG SL tablet  Generic drug:  nitroGLYCERIN  Place 0.4 mg under the tongue every 5 (five) minutes as needed for chest pain. For chest pain, max 3 doses     vitamin E 1000 UNIT capsule  Take 1,000 Units by mouth 3 (three) times a week.     warfarin 5 MG tablet  Commonly known as:  COUMADIN  Take 2.5 mg by mouth 2 (two) times daily.       Allergies  Allergen Reactions  . Other Other (See Comments)    Plastic tape tears skin       Follow-up Information   Schedule an appointment as soon as possible for a visit with Nelwyn Salisbury, MD.   Contact information:   [address] [place] [address] [phone]        The results of significant diagnostics from this hospitalization (including imaging, microbiology, ancillary and laboratory) are listed below for reference.    Significant Diagnostic Studies: Ct Head Wo Contrast  07/08/2012  *RADIOLOGY REPORT*  Clinical Data: 78 year old male with altered mental status. Inappropriate.  CT HEAD WITHOUT CONTRAST  Technique:  Contiguous axial images were obtained from the base of the skull through the vertex without contrast.  Comparison: 09/18/2007.  Findings: Visualized paranasal sinuses and mastoids are clear.  No acute osseous abnormality identified.  Visualized orbits and scalp soft tissues are within normal limits.  Calcified atherosclerosis at the skull base.  Mild generalized cerebral volume loss since 2009.  No ventriculomegaly. No midline shift, mass effect, or evidence of mass lesion.  Minimal to mild  subcortical white matter hypodensity is new.  Otherwise stable and normal gray-white matter differentiation. No evidence of cortically based acute infarction identified.  No acute intracranial hemorrhage identified.  No suspicious intracranial vascular hyperdensity.  IMPRESSION: No acute intracranial abnormality.  Mild generalized volume loss and nonspecific subcortical white matter changes since 2009.   Original Report Authenticated By: Erskine Speed, M.D.    Dg Chest Port 1 View  07/08/2012  *RADIOLOGY REPORT*  Clinical Data: 78 year old male with fever, altered mental status. Recent angioplasty.  PORTABLE CHEST - 1 VIEW  Comparison: 04/01/2012 and earlier.  Findings: AP portable semi upright view at 0256 hours.  Stable left chest three lead cardiac AICD.  Stable cardiac size and mediastinal contours.  Visualized tracheal air column is within normal limits. No pneumothorax or pulmonary edema.  Stable mild  blunting of the left costophrenic angle.  No pleural effusion or acute pulmonary opacity.  Multiple EKG leads and wires overlie the chest.  IMPRESSION: No acute cardiopulmonary abnormality.   Original Report Authenticated By: Erskine Speed, M.D.     Microbiology: Recent Results (from the past 240 hour(s))  CULTURE, BLOOD (ROUTINE X 2)     Status: None   Collection Time    07/08/12  2:50 AM      Result Value Range Status   Specimen Description BLOOD RIGHT ARM   Final   Special Requests BOTTLES DRAWN AEROBIC AND ANAEROBIC 10CC   Final   Culture  Setup Time 07/08/2012 09:55   Final   Culture     Final   Value:        BLOOD CULTURE RECEIVED NO GROWTH TO DATE CULTURE WILL BE HELD FOR 5 DAYS BEFORE ISSUING A FINAL NEGATIVE REPORT   Report Status PENDING   Incomplete  CULTURE, BLOOD (ROUTINE X 2)     Status: None   Collection Time    07/08/12  3:00 AM      Result Value Range Status   Specimen Description BLOOD RIGHT HAND   Final   Special Requests BOTTLES DRAWN AEROBIC ONLY 10CC   Final   Culture   Setup Time 07/08/2012 09:56   Final   Culture     Final   Value:        BLOOD CULTURE RECEIVED NO GROWTH TO DATE CULTURE WILL BE HELD FOR 5 DAYS BEFORE ISSUING A FINAL NEGATIVE REPORT   Report Status PENDING   Incomplete     Labs: Basic Metabolic Panel:  Recent Labs Lab 07/07/12 0908 07/08/12 0215 07/08/12 0750 07/09/12 0500  NA 139 135 136 139  K 4.6 4.8 4.9 4.7  CL 107 105 106 107  CO2  --  21 21 21   GLUCOSE 149* 91 108* 88  BUN 52* 43* 41* 34*  CREATININE 2.30* 2.16* 2.21* 2.19*  CALCIUM  --  8.3* 8.4 8.5   Liver Function Tests:  Recent Labs Lab 07/08/12 0215 07/08/12 0750  AST 17 15  ALT 10 9  ALKPHOS 56 55  BILITOT 0.6 0.8  PROT 6.4 6.2  ALBUMIN 3.2* 3.0*   No results found for this basename: LIPASE, AMYLASE,  in the last 168 hours No results found for this basename: AMMONIA,  in the last 168 hours CBC:  Recent Labs Lab 07/07/12 0908 07/08/12 0215 07/08/12 0750 07/09/12 0500  WBC  --  11.3* 14.3* 10.3  NEUTROABS  --   --  11.3*  --   HGB 14.6 13.1 12.8* 11.6*  HCT 43.0 38.1* 37.4* 34.1*  MCV  --  83.2 83.5 84.4  PLT  --  135* 122* 112*   Cardiac Enzymes: No results found for this basename: CKTOTAL, CKMB, CKMBINDEX, TROPONINI,  in the last 168 hours BNP: BNP (last 3 results) No results found for this basename: PROBNP,  in the last 8760 hours CBG:  Recent Labs Lab 07/08/12 0616 07/08/12 1135 07/08/12 1620 07/08/12 2100 07/09/12 0614  GLUCAP 85 169* 103* 90 90       Signed:  Faust Thorington  [facility] 07/09/2012, 10:51 AM

## 2012-07-10 ENCOUNTER — Encounter (HOSPITAL_COMMUNITY): Payer: Self-pay | Admitting: General Practice

## 2012-07-10 ENCOUNTER — Inpatient Hospital Stay (HOSPITAL_COMMUNITY)
Admission: AD | Admit: 2012-07-10 | Discharge: 2012-07-12 | DRG: 864 | Disposition: A | Discharge status: Home / Self Care | Payer: Medicare Other | Source: Ambulatory Visit | Attending: Internal Medicine | Admitting: Internal Medicine

## 2012-07-10 ENCOUNTER — Telehealth: Payer: Self-pay

## 2012-07-10 DIAGNOSIS — Z794 Long term (current) use of insulin: Secondary | ICD-10-CM

## 2012-07-10 DIAGNOSIS — I2589 Other forms of chronic ischemic heart disease: Secondary | ICD-10-CM | POA: Diagnosis present

## 2012-07-10 DIAGNOSIS — I2510 Atherosclerotic heart disease of native coronary artery without angina pectoris: Secondary | ICD-10-CM

## 2012-07-10 DIAGNOSIS — Z79899 Other long term (current) drug therapy: Secondary | ICD-10-CM

## 2012-07-10 DIAGNOSIS — I255 Ischemic cardiomyopathy: Secondary | ICD-10-CM

## 2012-07-10 DIAGNOSIS — I482 Chronic atrial fibrillation, unspecified: Secondary | ICD-10-CM | POA: Diagnosis present

## 2012-07-10 DIAGNOSIS — I10 Essential (primary) hypertension: Secondary | ICD-10-CM

## 2012-07-10 DIAGNOSIS — I5022 Chronic systolic (congestive) heart failure: Secondary | ICD-10-CM

## 2012-07-10 DIAGNOSIS — Z9861 Coronary angioplasty status: Secondary | ICD-10-CM

## 2012-07-10 DIAGNOSIS — R509 Fever, unspecified: Secondary | ICD-10-CM

## 2012-07-10 DIAGNOSIS — I70219 Atherosclerosis of native arteries of extremities with intermittent claudication, unspecified extremity: Secondary | ICD-10-CM

## 2012-07-10 DIAGNOSIS — E119 Type 2 diabetes mellitus without complications: Secondary | ICD-10-CM | POA: Diagnosis present

## 2012-07-10 DIAGNOSIS — I4891 Unspecified atrial fibrillation: Secondary | ICD-10-CM

## 2012-07-10 DIAGNOSIS — Z7901 Long term (current) use of anticoagulants: Secondary | ICD-10-CM

## 2012-07-10 DIAGNOSIS — N189 Chronic kidney disease, unspecified: Secondary | ICD-10-CM | POA: Diagnosis present

## 2012-07-10 DIAGNOSIS — Z95810 Presence of automatic (implantable) cardiac defibrillator: Secondary | ICD-10-CM

## 2012-07-10 DIAGNOSIS — I129 Hypertensive chronic kidney disease with stage 1 through stage 4 chronic kidney disease, or unspecified chronic kidney disease: Secondary | ICD-10-CM | POA: Diagnosis present

## 2012-07-10 DIAGNOSIS — I509 Heart failure, unspecified: Secondary | ICD-10-CM | POA: Diagnosis present

## 2012-07-10 DIAGNOSIS — I739 Peripheral vascular disease, unspecified: Secondary | ICD-10-CM | POA: Diagnosis present

## 2012-07-10 HISTORY — DX: Type 2 diabetes mellitus without complications: E11.9

## 2012-07-10 LAB — CBC WITH DIFFERENTIAL/PLATELET
Basophils Relative: 0 % (ref 0–1)
Eosinophils Absolute: 0.1 10*3/uL (ref 0.0–0.7)
Hemoglobin: 12 g/dL — ABNORMAL LOW (ref 13.0–17.0)
MCH: 29.1 pg (ref 26.0–34.0)
MCHC: 34.1 g/dL (ref 30.0–36.0)
Neutrophils Relative %: 72 % (ref 43–77)
Platelets: 127 10*3/uL — ABNORMAL LOW (ref 150–400)
WBC: 9.4 10*3/uL (ref 4.0–10.5)

## 2012-07-10 LAB — GLUCOSE, CAPILLARY: Glucose-Capillary: 146 mg/dL — ABNORMAL HIGH (ref 70–99)

## 2012-07-10 LAB — COMPREHENSIVE METABOLIC PANEL
Albumin: 3.1 g/dL — ABNORMAL LOW (ref 3.5–5.2)
Alkaline Phosphatase: 69 U/L (ref 39–117)
BUN: 38 mg/dL — ABNORMAL HIGH (ref 6–23)
Calcium: 8.9 mg/dL (ref 8.4–10.5)
Potassium: 4.9 mEq/L (ref 3.5–5.1)
Sodium: 139 mEq/L (ref 135–145)
Total Protein: 6.7 g/dL (ref 6.0–8.3)

## 2012-07-10 LAB — URINALYSIS, ROUTINE W REFLEX MICROSCOPIC
Bilirubin Urine: NEGATIVE
Glucose, UA: NEGATIVE mg/dL
Hgb urine dipstick: NEGATIVE
Protein, ur: NEGATIVE mg/dL
Urobilinogen, UA: 0.2 mg/dL (ref 0.0–1.0)

## 2012-07-10 MED ORDER — ACETAMINOPHEN 325 MG PO TABS: 650.0000 mg | ORAL_TABLET | Freq: Four times a day (QID) | ORAL | Status: DC | PRN

## 2012-07-10 MED ORDER — NITROGLYCERIN 0.4 MG SL SUBL: 0.4000 mg | SUBLINGUAL_TABLET | SUBLINGUAL | Status: DC | PRN

## 2012-07-10 MED ORDER — WARFARIN SODIUM 5 MG PO TABS
5.0000 mg | ORAL_TABLET | Freq: Once | ORAL | Status: AC
  Administered 2012-07-10: 5 mg via ORAL
  Filled 2012-07-10: qty 1

## 2012-07-10 MED ORDER — FUROSEMIDE 40 MG PO TABS
40.0000 mg | ORAL_TABLET | Freq: Every day | ORAL | Status: DC
  Administered 2012-07-11 – 2012-07-12 (×2): 40 mg via ORAL
  Filled 2012-07-10 (×2): qty 1

## 2012-07-10 MED ORDER — INSULIN ASPART PROT & ASPART (70-30 MIX) 100 UNIT/ML ~~LOC~~ SUSP
10.0000 [IU] | Freq: Two times a day (BID) | SUBCUTANEOUS | Status: DC
  Administered 2012-07-11 (×2): 10 [IU] via SUBCUTANEOUS
  Filled 2012-07-10: qty 10

## 2012-07-10 MED ORDER — WARFARIN - PHARMACIST DOSING INPATIENT
Freq: Every day | Status: DC
  Administered 2012-07-12: 18:00:00

## 2012-07-10 MED ORDER — ONDANSETRON HCL 4 MG PO TABS: 4.0000 mg | ORAL_TABLET | Freq: Four times a day (QID) | ORAL | Status: DC | PRN

## 2012-07-10 MED ORDER — GABAPENTIN 300 MG PO CAPS
300.0000 mg | ORAL_CAPSULE | Freq: Three times a day (TID) | ORAL | Status: DC
  Administered 2012-07-10 – 2012-07-12 (×5): 300 mg via ORAL
  Filled 2012-07-10 (×8): qty 1

## 2012-07-10 MED ORDER — SODIUM CHLORIDE 0.9 % IJ SOLN: 3.0000 mL | Freq: Two times a day (BID) | INTRAMUSCULAR | Status: DC

## 2012-07-10 MED ORDER — ACETAMINOPHEN 650 MG RE SUPP: 650.0000 mg | Freq: Four times a day (QID) | RECTAL | Status: DC | PRN

## 2012-07-10 MED ORDER — LOSARTAN POTASSIUM 50 MG PO TABS: 100.0000 mg | ORAL_TABLET | Freq: Every day | ORAL | Status: DC

## 2012-07-10 MED ORDER — ONDANSETRON HCL 4 MG/2ML IJ SOLN: 4.0000 mg | Freq: Four times a day (QID) | INTRAMUSCULAR | Status: DC | PRN

## 2012-07-10 MED ORDER — LOSARTAN POTASSIUM 50 MG PO TABS
100.0000 mg | ORAL_TABLET | Freq: Every day | ORAL | Status: DC
  Administered 2012-07-11 – 2012-07-12 (×2): 100 mg via ORAL
  Filled 2012-07-10 (×2): qty 2

## 2012-07-10 MED ORDER — FINASTERIDE 1 MG PO TABS: 1.0000 mg | ORAL_TABLET | Freq: Every day | ORAL | Status: DC

## 2012-07-10 MED ORDER — CARVEDILOL 6.25 MG PO TABS
6.2500 mg | ORAL_TABLET | Freq: Two times a day (BID) | ORAL | Status: DC
  Administered 2012-07-10 – 2012-07-12 (×4): 6.25 mg via ORAL
  Filled 2012-07-10 (×6): qty 1

## 2012-07-10 MED ORDER — INSULIN ASPART 100 UNIT/ML ~~LOC~~ SOLN
0.0000 [IU] | Freq: Three times a day (TID) | SUBCUTANEOUS | Status: DC
  Administered 2012-07-11 – 2012-07-12 (×2): 3 [IU] via SUBCUTANEOUS

## 2012-07-10 NOTE — Progress Notes (Signed)
Patient started running fever soon after discharge. He called his vascular surgeon. I have called the patient and discuss with him the fact that he is now having fever of unknown origin immediately after a surgical procedure and then we will have to readmit him to the hospital, obtain new cultures and infectious disease consultation. Patient and wife verbalized understanding and they're driving to the hospital now [name]

## 2012-07-10 NOTE — Telephone Encounter (Signed)
Phone call from pt's wife.  Reports that pt. has started running fever again; temp. 101.5 at 3:00 pm.  Wife gave pt. ES Tylenol 2 tabs, at that time.  Denies any symptoms of cough/ congestion, dysuria, or nausea/vomiting.  States the right groin puncture site, from recent angiogram, is good; no redness or inflammation.  States pt. was discharged yesterday, and didn't receive any specific instructions about what to do if he had fever again.  Encouraged to maintain hydration, take Tylenol, per package instructions, for fever > 101 degrees; cautioned not to exceed 4000 mg /24 hrs.  Advised to continue to monitor for s/s of infection or worsening fever.  Instructed wife she could call office over weekend and leave msg for the MD on call, if further questions, but if symptoms worsen, pt. ultimately will need to go to the ER.  Wife verb. understanding.

## 2012-07-10 NOTE — Progress Notes (Signed)
ANTICOAGULATION CONSULT NOTE - Follow Up Consult  Pharmacy Consult for coumadin Indication: atrial fibrillation  Allergies  Allergen Reactions  . Other Other (See Comments)    Plastic tape tears skin    Patient Measurements: Height: 5\' 8"  (172.7 cm) Weight: 178 lb 2.1 oz (80.8 kg) IBW/kg (Calculated) : 68.4   Vital Signs: Temp: 98.2 F (36.8 C) (05/09 1852) Temp src: Oral (05/09 1852) BP: 136/63 mmHg (05/09 1852) Pulse Rate: 60 (05/09 1852)  Labs:  Recent Labs  07/08/12 0215 07/08/12 0750 07/09/12 0500  HGB 13.1 12.8* 11.6*  HCT 38.1* 37.4* 34.1*  PLT 135* 122* 112*  LABPROT 15.5*  --  15.9*  INR 1.25  --  1.30  CREATININE 2.16* 2.21* 2.19*    Estimated Creatinine Clearance: 27.8 ml/min (by C-G formula based on Cr of 2.19).   Medications:  Scheduled:  . carvedilol  6.25 mg Oral BID WC  . [START ON 07/11/2012] furosemide  40 mg Oral Daily  . gabapentin  300 mg Oral TID  . [START ON 07/11/2012] insulin aspart  0-9 Units Subcutaneous TID WC  . [START ON 07/11/2012] insulin aspart protamine- aspart  10 Units Subcutaneous BID WC  . sodium chloride  3 mL Intravenous Q12H  . [DISCONTINUED] finasteride  1 mg Oral Daily  . [DISCONTINUED] losartan  100 mg Oral Daily     Assessment: 78 yo male on coumadin PTA for afib. He was dced yesterday but now being readmitted for new fevers. His INR was 1.3 yesterday so I doubt it's therapeutic today.  Apparently, he took 2.5mg  this AM so will try to give him a little extra today.   Home coumadin dose: 5mg /day  Goal of Therapy:  INR 2-3 Monitor platelets by anticoagulation protocol: Yes   Plan:    Coumadin 5mg  PO x1 to make 7.5mg  today Daily INR

## 2012-07-10 NOTE — H&P (Signed)
[facility] History and Physical  NYQUAN SELBE GNF:[id number] DOB: February 19, 1935 DOA: 07/10/2012  Referring physician: Patient was a direct admit. PCP: Nelwyn Salisbury, MD   Chief Complaint: Fever.  HPI: [name] is a 78 y.o. male who was just discharged yesterday started developing fever and chills at home. Patient originally was admitted a few days ago for fever after patient had angioplasty. Patient was afebrile and blood cultures were negative and antibiotics were discontinued and patient was discharged home. After patient went home patient started developing fever and chills. As per patient's wife patient had fevers up to 101F. Patient did not have any nausea vomiting abdominal pain diarrhea chest pain shortness of breath or any productive cough. At this time patient has been directly admitted for further observation. Patient denies any recent travel or any insect bites.  Review of Systems: As presented in the history of presenting illness, rest negative.  Past Medical History  Diagnosis Date  . Hyperlipidemia   . Gout   . Benign prostatic hypertrophy   . Atrial fibrillation     sees Dr. Verdis Prime  . Chronic systolic dysfunction of left ventricle     a. mixed ischemic and nonischemic CM,  EF 35%. b. s/p AICD implantation.  . CHF (congestive heart failure)   . PAD (peripheral artery disease)     Severe by PV angiogram 09/2011  . ED (erectile dysfunction)   . Renal artery stenosis     s/p stenting 2009  . Arthritis   . Pacemaker     medtronic  . CAD (coronary artery disease)     a. s/p mid LAD stenting with DES 2008 Dr. Verdis Prime  . ICD (implantable cardiac defibrillator) in place     medtronic, Dr. Johney Frame  . ICD (implantable cardiac defibrillator) in place     due for check in Feb/2013  . Sleep apnea     hx of "had surgery for"  . Hypertension     sees Dr. Claris Che  . Type II diabetes mellitus    Past Surgical History  Procedure Laterality Date  . Turp  vaporization    . Cardiac defibrillator placement  12/26/09    pacemaker combo  . Cervical epidural injection  2013  . Femoral-tibial bypass graft  09/25/2011    Procedure: BYPASS GRAFT FEMORAL-TIBIAL ARTERY;  Surgeon: Pryor Ochoa, MD;  Location: [facility] OR;  Service: Vascular;  Laterality: Left;  Left Femoral - Anterior Tibial Bypass;  saphenous vein graft from left leg  . Intraoperative arteriogram  09/25/2011    Procedure: INTRA OPERATIVE ARTERIOGRAM;  Surgeon: Pryor Ochoa, MD;  Location: [facility] OR;  Service: Vascular;  Laterality: Left;  . Femoral-tibial bypass graft  02/07/2012    Procedure: BYPASS GRAFT FEMORAL-TIBIAL ARTERY;  Surgeon: Larina Earthly, MD;  Location: [facility] OR;  Service: Vascular;  Laterality: Left;  Thrombectomy Left Femoral - Tibial Bypass Graft  . Embolectomy  02/07/2012    Procedure: EMBOLECTOMY;  Surgeon: Larina Earthly, MD;  Location: [facility] OR;  Service: Vascular;  Laterality: Left;  . Femoral-tibial bypass graft  04/03/2012    Procedure: BYPASS GRAFT FEMORAL-TIBIAL ARTERY;  Surgeon: Pryor Ochoa, MD;  Location: [facility] OR;  Service: Vascular;  Laterality: Left;  Redo  . Insert / replace / remove pacemaker  2007  . Coronary angioplasty with stent placement  ~ 2000  . Coronary angioplasty    . Uvulopalatopharyngoplasty (uppp)/tonsillectomy/septoplasty  06/30/2003    [name] 06/30/2003 (07/10/2012)  . Shoulder open  rotator cuff repair Right 2001    repair of lacerated right/notes 10/11/1999  (07/10/2012)  . Foot surgery Right 03/20/2001    "have plates and screws in; didn't break it" (07/10/2012)  . Carpal tunnel release Right 2002    [name] 03/20/2001 (07/10/2012)  . Biceps tendon repair Right 2001    [name] 03/20/2001 (07/10/2012)  . Renal artery stent  2009   Social History:  reports that he has never smoked. He has never used smokeless tobacco. He reports that he drinks about 2.4 ounces of alcohol per week. He reports that he does not use illicit drugs. Lives at home. where does patient  live-- Can do ADLs. Can patient participate in ADLs?  Allergies  Allergen Reactions  . Other Other (See Comments)    Plastic tape tears skin    Family History  Problem Relation Age of Onset  . Cancer      breast/fhx  . Heart disease      fhx  . Diabetes Neg Hx   . Cancer Father       Prior to Admission medications   Medication Sig Start Date End Date Taking? Authorizing Provider  carvedilol (COREG) 6.25 MG tablet Take 1 tablet (6.25 mg total) by mouth 2 (two) times daily with a meal. 05/22/12  Yes Nelwyn Salisbury, MD  finasteride (PROPECIA) 1 MG tablet Take 1 mg by mouth daily.   Yes Historical Provider, MD  furosemide (LASIX) 40 MG tablet Take 1 tablet (40 mg total) by mouth daily. 05/22/12  Yes Nelwyn Salisbury, MD  gabapentin (NEURONTIN) 300 MG capsule Take 300 mg by mouth 3 (three) times daily.    Yes Historical Provider, MD  insulin aspart protamine-insulin aspart (NOVOLOG 70/30) (70-30) 100 UNIT/ML injection Inject 10 Units into the skin 2 (two) times daily with a meal. 10 units with breakfast, and 10 units with the evening meal   Yes Historical Provider, MD  losartan (COZAAR) 100 MG tablet Take 1 tablet (100 mg total) by mouth daily. 05/18/12  Yes Nelwyn Salisbury, MD  NITROSTAT 0.4 MG SL tablet Place 0.4 mg under the tongue every 5 (five) minutes as needed for chest pain. For chest pain, max 3 doses 12/31/11  Yes Historical Provider, MD  vitamin E 1000 UNIT capsule Take 1,000 Units by mouth 3 (three) times a week.    Yes Historical Provider, MD  warfarin (COUMADIN) 5 MG tablet Take 2.5 mg by mouth 2 (two) times daily. 05/21/12  Yes Baker Pierini, FNP   Physical Exam: Filed Vitals:   07/10/12 1852  BP: 136/63  Pulse: 60  Temp: 98.2 F (36.8 C)  TempSrc: Oral  Resp: 20  Height: 5\' 8"  (1.727 m)  Weight: 80.8 kg (178 lb 2.1 oz)  SpO2: 100%     General:  Well-developed well-nourished.  Eyes: Anicteric no pallor.  ENT: No discharge from the ears eyes nose and  mouth.  Neck: No mass felt.  Cardiovascular: S1-S2 heard.  Respiratory: No rhonchi or crepitations.  Abdomen: Soft nontender bowel sounds present.  Skin: No rash. Has chronic skin changes from previous wounds.  Musculoskeletal: No edema.  Psychiatric: Appears normal.  Neurologic: Alert and oriented to time place and person. Moves all extremities.  Labs on Admission:  Basic Metabolic Panel:  Recent Labs Lab 07/07/12 0908 07/08/12 0215 07/08/12 0750 07/09/12 0500  NA 139 135 136 139  K 4.6 4.8 4.9 4.7  CL 107 105 106 107  CO2  --  21 21 21  GLUCOSE 149* 91 108* 88  BUN 52* 43* 41* 34*  CREATININE 2.30* 2.16* 2.21* 2.19*  CALCIUM  --  8.3* 8.4 8.5   Liver Function Tests:  Recent Labs Lab 07/08/12 0215 07/08/12 0750  AST 17 15  ALT 10 9  ALKPHOS 56 55  BILITOT 0.6 0.8  PROT 6.4 6.2  ALBUMIN 3.2* 3.0*   No results found for this basename: LIPASE, AMYLASE,  in the last 168 hours No results found for this basename: AMMONIA,  in the last 168 hours CBC:  Recent Labs Lab 07/07/12 0908 07/08/12 0215 07/08/12 0750 07/09/12 0500  WBC  --  11.3* 14.3* 10.3  NEUTROABS  --   --  11.3*  --   HGB 14.6 13.1 12.8* 11.6*  HCT 43.0 38.1* 37.4* 34.1*  MCV  --  83.2 83.5 84.4  PLT  --  135* 122* 112*   Cardiac Enzymes: No results found for this basename: CKTOTAL, CKMB, CKMBINDEX, TROPONINI,  in the last 168 hours  BNP (last 3 results) No results found for this basename: PROBNP,  in the last 8760 hours CBG:  Recent Labs Lab 07/08/12 0616 07/08/12 1135 07/08/12 1620 07/08/12 2100 07/09/12 0614  GLUCAP 85 169* 103* 90 90    Radiological Exams on Admission: No results found.   Assessment/Plan Principal Problem:   Fever Active Problems:   HYPERTENSION   CORONARY ARTERY DISEASE   Atrial fibrillation   Chronic systolic congestive heart failure   Peripheral vascular disease, unspecified   Diabetes   1. Fever and chills - presently afebrile. We'll get  blood cultures and UA and urine cultures and chest x-ray again. I have not placed patient on any antibiotics. Closely observe. 2. Atrial fibrillation - rate controlled. Continue present medications. Coumadin per pharmacy. 3. Peripheral vascular disease - no acute issues. 4. Chronic systolic heart failure - presently compensated. 5. CAD - denies any chest pain.    Code Status: Full code.  Family Communication: Patient's wife at the bedside.  Disposition Plan: Admit to inpatient.    Brinley Treanor N. [facility] Pager [phone].  If 7PM-7AM, please contact night-coverage [email or web address] Password [facility] 07/10/2012, 7:52 PM

## 2012-07-11 ENCOUNTER — Inpatient Hospital Stay (HOSPITAL_COMMUNITY): Payer: Medicare Other

## 2012-07-11 DIAGNOSIS — I70219 Atherosclerosis of native arteries of extremities with intermittent claudication, unspecified extremity: Secondary | ICD-10-CM

## 2012-07-11 LAB — CBC
Hemoglobin: 12.2 g/dL — ABNORMAL LOW (ref 13.0–17.0)
RBC: 4.31 MIL/uL (ref 4.22–5.81)
WBC: 7.6 10*3/uL (ref 4.0–10.5)

## 2012-07-11 LAB — BASIC METABOLIC PANEL
GFR calc Af Amer: 31 mL/min — ABNORMAL LOW (ref >90)
GFR calc non Af Amer: 26 mL/min — ABNORMAL LOW (ref >90)
Potassium: 4.7 mEq/L (ref 3.5–5.1)
Sodium: 135 mEq/L (ref 135–145)

## 2012-07-11 LAB — PROTIME-INR
INR: 1.37 (ref 0.00–1.49)
Prothrombin Time: 16.5 seconds — ABNORMAL HIGH (ref 11.6–15.2)

## 2012-07-11 LAB — GLUCOSE, CAPILLARY: Glucose-Capillary: 88 mg/dL (ref 70–99)

## 2012-07-11 MED ORDER — WARFARIN SODIUM 7.5 MG PO TABS
7.5000 mg | ORAL_TABLET | Freq: Once | ORAL | Status: AC
  Administered 2012-07-11: 7.5 mg via ORAL
  Filled 2012-07-11 (×2): qty 1

## 2012-07-11 NOTE — Progress Notes (Signed)
Patient ID: [name], male   DOB: 08-27-1934, 78 y.o.   MRN: [id number]         [facility]    Date of Admission:  07/10/2012          Reason for Consult: Post operative fever    Referring Physician: Dr. Lonia Blood  Principal Problem:   Fever Active Problems:   HYPERTENSION   CORONARY ARTERY DISEASE   Atrial fibrillation   Chronic systolic congestive heart failure   Diabetes   . carvedilol  6.25 mg Oral BID WC  . furosemide  40 mg Oral Daily  . gabapentin  300 mg Oral TID  . insulin aspart  0-9 Units Subcutaneous TID WC  . insulin aspart protamine- aspart  10 Units Subcutaneous BID WC  . losartan  100 mg Oral Daily  . sodium chloride  3 mL Intravenous Q12H  . Warfarin - Pharmacist Dosing Inpatient   Does not apply q1800    Recommendations: 1. Continue observation off of antibiotics   Assessment: So far, there is no obvious source for his recent fevers. I do not see that he received any perioperative antibiotic prophylaxis before his recent angioplasty that could have made his initial blood cultures falsely negative. It is unlikely that his recent 2 week course of cephalexin finishing on April 26 would have had an impact on recent cultures. If he continues to have fever I would consider scanning his right lower leg tomorrow just in case he has a partially treated abscess. I agree with observation off of antibiotics for now.   HPI: [name] is a 78 y.o. male who underwent uncomplicated left femoral to tibial artery bypass graft angioplasty on May 6. He was discharged home that evening and about an hour and half after arriving home he began to have violent chills associated with fever up to 103. He became confused and delirious and was brought back to the hospital. Blood cultures were obtained and he was started on empiric vancomycin and cefepime. He improved rapidly and was discharged home off of antibiotics the following day. Yesterday  he had recurrent fever up to 101 and was readmitted. Blood cultures were repeated. Antibiotics were withheld. He is feeling better again today. All blood cultures remain negative.  He recalls receiving oral antibiotics on 2 occasions in the past several months. I called to his [facility] pharmacy confirms that he was treated with cephalexin for 10 days starting February 25 and notes an apneic confirm that this was prescribed for mild left groin cellulitis. He received a second course of cephalexin for 14 days starting on April 12 for possible infection in his right calf where a vein graft was harvested earlier this year. I cannot locate any recent wound cultures.   Review of Systems: Constitutional: positive for chills, fatigue, fevers and sweats, negative for anorexia and weight loss Eyes: negative Ears, nose, mouth, throat, and face: negative for nasal congestion and sore throat Respiratory: positive for dyspnea on exertion, negative for cough and sputum Cardiovascular: negative for chest pain and chest pressure/discomfort Gastrointestinal: negative for abdominal pain, diarrhea, nausea and vomiting Genitourinary:negative for dysuria Integument/breast: negative for rash  Past Medical History  Diagnosis Date  . Hyperlipidemia   . Gout   . Benign prostatic hypertrophy   . Atrial fibrillation     sees Dr. Verdis Prime  . Chronic systolic dysfunction of left ventricle     a. mixed ischemic and nonischemic CM,  EF 35%.  b. s/p AICD implantation.  . CHF (congestive heart failure)   . PAD (peripheral artery disease)     Severe by PV angiogram 09/2011  . ED (erectile dysfunction)   . Renal artery stenosis     s/p stenting 2009  . Arthritis   . Pacemaker     medtronic  . CAD (coronary artery disease)     a. s/p mid LAD stenting with DES 2008 Dr. Verdis Prime  . ICD (implantable cardiac defibrillator) in place     medtronic, Dr. Johney Frame  . ICD (implantable cardiac defibrillator) in place     due  for check in Feb/2013  . Sleep apnea     hx of "had surgery for"  . Hypertension     sees Dr. Claris Che  . Type II diabetes mellitus     History  Substance Use Topics  . Smoking status: Never Smoker   . Smokeless tobacco: Never Used     Comment: 1-2 cigars when golfing  . Alcohol Use: 2.4 oz/week    2 Glasses of wine, 2 Shots of liquor per week     Comment: 07/10/2012 "galss of wine or vodka tonic 3 X/wk"    Family History  Problem Relation Age of Onset  . Cancer      breast/fhx  . Heart disease      fhx  . Diabetes Neg Hx   . Cancer Father    Allergies  Allergen Reactions  . Other Other (See Comments)    Plastic tape tears skin    OBJECTIVE: Blood pressure 150/53, pulse 72, temperature 99 F (37.2 C), temperature source Oral, resp. rate 20, height 5\' 8"  (1.727 m), weight 80.5 kg (177 lb 7.5 oz), SpO2 99.00%. General: He appears comfortable and in no distress Skin: He has scattered ecchymoses over his arms. Oral: No thrush or other lesions Lungs: Clear Cor: Irregular S1 and S2 with no murmur heard. His left anterior chest implantable defibrillator site appears normal. Abdomen: Soft and nontender Extremities: Old right groin incision is healed without inflammation. There is no inflammation at the recent right groin puncture site. He does have a large scab over the midportion of his right lower leg incision. It is mildly tender. He has no acute joint swelling.  Microbiology: Recent Results (from the past 240 hour(s))  CULTURE, BLOOD (ROUTINE X 2)     Status: None   Collection Time    07/08/12  2:50 AM      Result Value Range Status   Specimen Description BLOOD RIGHT ARM   Final   Special Requests BOTTLES DRAWN AEROBIC AND ANAEROBIC 10CC   Final   Culture  Setup Time 07/08/2012 09:55   Final   Culture     Final   Value:        BLOOD CULTURE RECEIVED NO GROWTH TO DATE CULTURE WILL BE HELD FOR 5 DAYS BEFORE ISSUING A FINAL NEGATIVE REPORT   Report Status PENDING    Incomplete  CULTURE, BLOOD (ROUTINE X 2)     Status: None   Collection Time    07/08/12  3:00 AM      Result Value Range Status   Specimen Description BLOOD RIGHT HAND   Final   Special Requests BOTTLES DRAWN AEROBIC ONLY 10CC   Final   Culture  Setup Time 07/08/2012 09:56   Final   Culture     Final   Value:        BLOOD CULTURE RECEIVED NO GROWTH TO  DATE CULTURE WILL BE HELD FOR 5 DAYS BEFORE ISSUING A FINAL NEGATIVE REPORT   Report Status PENDING   Incomplete    Cliffton Asters, MD [facility] [phone] pager   [phone] cell 07/11/2012, 1:00 PM

## 2012-07-11 NOTE — Progress Notes (Signed)
ANTICOAGULATION CONSULT NOTE - Follow Up Consult  Pharmacy Consult for Coumadin Indication: atrial fibrillation  Allergies  Allergen Reactions  . Other Other (See Comments)    Plastic tape tears skin    Patient Measurements: Height: 5\' 8"  (172.7 cm) Weight: 177 lb 7.5 oz (80.5 kg) IBW/kg (Calculated) : 68.4 Heparin Dosing Weight:   Vital Signs: Temp: 99 F (37.2 C) (05/10 0453) Temp src: Oral (05/10 0453) BP: 150/53 mmHg (05/10 0453) Pulse Rate: 72 (05/10 0453)  Labs:  Recent Labs  07/09/12 0500 07/10/12 2020 07/11/12 0511  HGB 11.6* 12.0* 12.2*  HCT 34.1* 35.2* 36.6*  PLT 112* 127* 129*  LABPROT 15.9*  --  16.5*  INR 1.30  --  1.37  CREATININE 2.19* 2.41* 2.27*    Estimated Creatinine Clearance: 26.8 ml/min (by C-G formula based on Cr of 2.27).   Medications:  Scheduled:  . carvedilol  6.25 mg Oral BID WC  . furosemide  40 mg Oral Daily  . gabapentin  300 mg Oral TID  . insulin aspart  0-9 Units Subcutaneous TID WC  . insulin aspart protamine- aspart  10 Units Subcutaneous BID WC  . losartan  100 mg Oral Daily  . sodium chloride  3 mL Intravenous Q12H  . [COMPLETED] warfarin  5 mg Oral ONCE-1800  . Warfarin - Pharmacist Dosing Inpatient   Does not apply q1800  . [DISCONTINUED] finasteride  1 mg Oral Daily  . [DISCONTINUED] losartan  100 mg Oral Daily    Assessment: 78yo male with AFib.  INR 1.37 this AM.  No bleeding problems noted.  Goal of Therapy:  INR 2-3 Monitor platelets by anticoagulation protocol: Yes   Plan:  1.  Coumadin 7.5mg  today 2.  F/U in AM  Marisue Humble, PharmD Clinical Pharmacist [facility]

## 2012-07-11 NOTE — Progress Notes (Signed)
[facility] PROGRESS NOTE  [name] MWN:[id number] DOB: Nov 26, 1934 DOA: 07/10/2012 PCP: Nelwyn Salisbury, MD  Assessment/Plan: 1. Fever - first  episode on 07/08/12 - blood cultures from that day still without growth- we did suspect bacteremia and patent was started empirically on  vancomycin and cefepime. On 5/8 patient was taken off abx and discharged. Fever came back on 07/10/12. Patient was advised to come back to the hospital and new cultures were drawn - now he is again afebrile and we are monitoring him off abx. Plan for ID consultation.   2. A. fib presently rate controlled - continue Coumadin per pharmacy. Patient was off Coumadin for last few days for the procedure done yesterday.  At this time is no different signs of any bleed patient has not complained of any abdominal pain his hemoglobin is stable.  3. CAD status post stenting - denies any chest pain. 4. Ischemic cardiomyopathy last EF was 35% - presently I'm holding patient's Lasix for now. Closely follow blood pressure trends and patient's respiratory status. For now and gently hydrating for 10 hours. 5. Hypertension - Continue Cozaar. 6. Diabetes mellitus type 2 - continue home medications. 7. Chronic kidney disease - creatinine appears at baseline. Closely follow intake output and metabolic panel and daily weights.  HPI/Subjective: No complains   Objective: Filed Vitals:   07/10/12 1852 07/10/12 2124 07/10/12 2131 07/11/12 0453  BP: 136/63 139/49 145/68 150/53  Pulse: 60 72 74 72  Temp: 98.2 F (36.8 C) 98.5 F (36.9 C)  99 F (37.2 C)  TempSrc: Oral   Oral  Resp: 20 24  20   Height: 5\' 8"  (1.727 m)     Weight: 80.8 kg (178 lb 2.1 oz)   80.5 kg (177 lb 7.5 oz)  SpO2: 100% 100%  99%    Intake/Output Summary (Last 24 hours) at 07/11/12 1024 Last data filed at 07/11/12 0457  Gross per 24 hour  Intake      0 ml  Output    475 ml  Net   -475 ml   Filed Weights   07/10/12 1852 07/11/12 0453  Weight: 80.8 kg  (178 lb 2.1 oz) 80.5 kg (177 lb 7.5 oz)    Patient Vitals for the past 24 hrs:  BP Temp Temp src Pulse Resp SpO2 Height Weight  07/11/12 0453 150/53 mmHg 99 F (37.2 C) Oral 72 20 99 % - 80.5 kg (177 lb 7.5 oz)  07/10/12 2131 145/68 mmHg - - 74 - - - -  07/10/12 2124 139/49 mmHg 98.5 F (36.9 C) - 72 24 100 % - -  07/10/12 1852 136/63 mmHg 98.2 F (36.8 C) Oral 60 20 100 % 5\' 8"  (1.727 m) 80.8 kg (178 lb 2.1 oz)    Exam:   General:  axox3  Cardiovascular: irreg irreg   Respiratory: ctab   Abdomen: soft  Musculoskeletal: intact    Data Reviewed: Basic Metabolic Panel:  Recent Labs Lab 07/08/12 0215 07/08/12 0750 07/09/12 0500 07/10/12 2020 07/11/12 0511  NA 135 136 139 139 135  K 4.8 4.9 4.7 4.9 4.7  CL 105 106 107 105 103  CO2 21 21 21 24 22   GLUCOSE 91 108* 88 56* 114*  BUN 43* 41* 34* 38* 36*  CREATININE 2.16* 2.21* 2.19* 2.41* 2.27*  CALCIUM 8.3* 8.4 8.5 8.9 8.8   Liver Function Tests:  Recent Labs Lab 07/08/12 0215 07/08/12 0750 07/10/12 2020  AST 17 15 28   ALT 10 9 23   ALKPHOS  56 55 69  BILITOT 0.6 0.8 0.6  PROT 6.4 6.2 6.7  ALBUMIN 3.2* 3.0* 3.1*   No results found for this basename: LIPASE, AMYLASE,  in the last 168 hours No results found for this basename: AMMONIA,  in the last 168 hours CBC:  Recent Labs Lab 07/08/12 0215 07/08/12 0750 07/09/12 0500 07/10/12 2020 07/11/12 0511  WBC 11.3* 14.3* 10.3 9.4 7.6  NEUTROABS  --  11.3*  --  6.8  --   HGB 13.1 12.8* 11.6* 12.0* 12.2*  HCT 38.1* 37.4* 34.1* 35.2* 36.6*  MCV 83.2 83.5 84.4 85.4 84.9  PLT 135* 122* 112* 127* 129*   Cardiac Enzymes: No results found for this basename: CKTOTAL, CKMB, CKMBINDEX, TROPONINI,  in the last 168 hours BNP (last 3 results) No results found for this basename: PROBNP,  in the last 8760 hours CBG:  Recent Labs Lab 07/08/12 1620 07/08/12 2100 07/09/12 0614 07/10/12 2152 07/11/12 0818  GLUCAP 103* 90 90 146* 125*    Recent Results (from the  past 240 hour(s))  CULTURE, BLOOD (ROUTINE X 2)     Status: None   Collection Time    07/08/12  2:50 AM      Result Value Range Status   Specimen Description BLOOD RIGHT ARM   Final   Special Requests BOTTLES DRAWN AEROBIC AND ANAEROBIC 10CC   Final   Culture  Setup Time 07/08/2012 09:55   Final   Culture     Final   Value:        BLOOD CULTURE RECEIVED NO GROWTH TO DATE CULTURE WILL BE HELD FOR 5 DAYS BEFORE ISSUING A FINAL NEGATIVE REPORT   Report Status PENDING   Incomplete  CULTURE, BLOOD (ROUTINE X 2)     Status: None   Collection Time    07/08/12  3:00 AM      Result Value Range Status   Specimen Description BLOOD RIGHT HAND   Final   Special Requests BOTTLES DRAWN AEROBIC ONLY 10CC   Final   Culture  Setup Time 07/08/2012 09:56   Final   Culture     Final   Value:        BLOOD CULTURE RECEIVED NO GROWTH TO DATE CULTURE WILL BE HELD FOR 5 DAYS BEFORE ISSUING A FINAL NEGATIVE REPORT   Report Status PENDING   Incomplete     Studies: Dg Chest Port 1 View  07/11/2012  *RADIOLOGY REPORT*  Clinical Data: Fever.  PORTABLE CHEST - 1 VIEW  Comparison: 07/08/2012  Findings: AICD remains in place.  Atherosclerotic calcification of the aortic arch is present.  Heart size within normal limits for technique.  Probable enchondroma in the left proximal humerus.  Minimal blunting of the left lateral costophrenic angle is chronic and may be due to scarring.  IMPRESSION:  1.  Minimal scarring along the left costophrenic angle. 2.  Atherosclerotic aortic arch. 3.  No acute findings.   Original Report Authenticated By: Gaylyn Rong, M.D.     Scheduled Meds: . carvedilol  6.25 mg Oral BID WC  . furosemide  40 mg Oral Daily  . gabapentin  300 mg Oral TID  . insulin aspart  0-9 Units Subcutaneous TID WC  . insulin aspart protamine- aspart  10 Units Subcutaneous BID WC  . losartan  100 mg Oral Daily  . sodium chloride  3 mL Intravenous Q12H  . Warfarin - Pharmacist Dosing Inpatient   Does not  apply q1800   Continuous Infusions:  Principal Problem:   Fever Active Problems:   HYPERTENSION   CORONARY ARTERY DISEASE   Atrial fibrillation   Chronic systolic congestive heart failure   Peripheral vascular disease, unspecified   Diabetes       [name]  [facility] Pager [phone]. If 7PM-7AM, please contact night-coverage at [email or web address], password [facility] 07/11/2012, 10:24 AM  LOS: 1 day

## 2012-07-12 LAB — URINE CULTURE
Colony Count: NO GROWTH
Culture: NO GROWTH

## 2012-07-12 LAB — GLUCOSE, CAPILLARY
Glucose-Capillary: 106 mg/dL — ABNORMAL HIGH (ref 70–99)
Glucose-Capillary: 222 mg/dL — ABNORMAL HIGH (ref 70–99)

## 2012-07-12 LAB — PROTIME-INR: INR: 1.39 (ref 0.00–1.49)

## 2012-07-12 MED ORDER — WARFARIN SODIUM 7.5 MG PO TABS
7.5000 mg | ORAL_TABLET | Freq: Once | ORAL | Status: AC
  Administered 2012-07-12: 7.5 mg via ORAL
  Filled 2012-07-12: qty 1

## 2012-07-12 NOTE — Progress Notes (Signed)
Patient ID: [name], male   DOB: 07-05-1934, 78 y.o.   MRN: [id number]         [facility]    Date of Admission:  07/10/2012     Principal Problem:   Fever Active Problems:   HYPERTENSION   CORONARY ARTERY DISEASE   Atrial fibrillation   Chronic systolic congestive heart failure   Diabetes   . carvedilol  6.25 mg Oral BID WC  . furosemide  40 mg Oral Daily  . gabapentin  300 mg Oral TID  . insulin aspart  0-9 Units Subcutaneous TID WC  . insulin aspart protamine- aspart  10 Units Subcutaneous BID WC  . losartan  100 mg Oral Daily  . sodium chloride  3 mL Intravenous Q12H  . Warfarin - Pharmacist Dosing Inpatient   Does not apply q1800    Subjective: He is feeling much better and back to normal except for some right heel pain that began overnight. He had similar pain several years ago that resolved after receiving a steroid injection.  Objective: Temp:  [98.1 F (36.7 C)-98.2 F (36.8 C)] 98.2 F (36.8 C) (05/11 0533) Pulse Rate:  [69-73] 71 (05/11 0533) Resp:  [18] 18 (05/11 0533) BP: (120-134)/(52-59) 120/52 mmHg (05/11 0533) SpO2:  [97 %-98 %] 97 % (05/11 0533)  General: Alert, sitting on the side of his bed, in no distress Skin: Scattered ecchymoses Lungs: Clear Cor: Regular S1 and S2 no murmurs Abdomen: Nontender Stable scab right medial calf without evidence of infection Right lateral heel tender to palpation but no evidence of infection  Lab Results Lab Results  Component Value Date   WBC 7.6 07/11/2012   HGB 12.2* 07/11/2012   HCT 36.6* 07/11/2012   MCV 84.9 07/11/2012   PLT 129* 07/11/2012    Lab Results  Component Value Date   CREATININE 2.27* 07/11/2012   BUN 36* 07/11/2012   NA 135 07/11/2012   K 4.7 07/11/2012   CL 103 07/11/2012   CO2 22 07/11/2012    Lab Results  Component Value Date   ALT 23 07/10/2012   AST 28 07/10/2012   ALKPHOS 69 07/10/2012   BILITOT 0.6 07/10/2012      Microbiology: Recent Results (from  the past 240 hour(s))  CULTURE, BLOOD (ROUTINE X 2)     Status: None   Collection Time    07/08/12  2:50 AM      Result Value Range Status   Specimen Description BLOOD RIGHT ARM   Final   Special Requests BOTTLES DRAWN AEROBIC AND ANAEROBIC 10CC   Final   Culture  Setup Time 07/08/2012 09:55   Final   Culture     Final   Value:        BLOOD CULTURE RECEIVED NO GROWTH TO DATE CULTURE WILL BE HELD FOR 5 DAYS BEFORE ISSUING A FINAL NEGATIVE REPORT   Report Status PENDING   Incomplete  CULTURE, BLOOD (ROUTINE X 2)     Status: None   Collection Time    07/08/12  3:00 AM      Result Value Range Status   Specimen Description BLOOD RIGHT HAND   Final   Special Requests BOTTLES DRAWN AEROBIC ONLY 10CC   Final   Culture  Setup Time 07/08/2012 09:56   Final   Culture     Final   Value:        BLOOD CULTURE RECEIVED NO GROWTH TO DATE CULTURE WILL BE HELD FOR 5  DAYS BEFORE ISSUING A FINAL NEGATIVE REPORT   Report Status PENDING   Incomplete  CULTURE, BLOOD (ROUTINE X 2)     Status: None   Collection Time    07/10/12  8:15 PM      Result Value Range Status   Specimen Description BLOOD LEFT ARM   Final   Special Requests BOTTLES DRAWN AEROBIC ONLY 5CC   Final   Culture  Setup Time 07/11/2012 05:05   Final   Culture     Final   Value:        BLOOD CULTURE RECEIVED NO GROWTH TO DATE CULTURE WILL BE HELD FOR 5 DAYS BEFORE ISSUING A FINAL NEGATIVE REPORT   Report Status PENDING   Incomplete  CULTURE, BLOOD (ROUTINE X 2)     Status: None   Collection Time    07/10/12  8:20 PM      Result Value Range Status   Specimen Description BLOOD LEFT ARM   Final   Special Requests BOTTLES DRAWN AEROBIC ONLY [facility]   Final   Culture  Setup Time 07/11/2012 05:05   Final   Culture     Final   Value:        BLOOD CULTURE RECEIVED NO GROWTH TO DATE CULTURE WILL BE HELD FOR 5 DAYS BEFORE ISSUING A FINAL NEGATIVE REPORT   Report Status PENDING   Incomplete  URINE CULTURE     Status: None   Collection Time     07/10/12  9:43 PM      Result Value Range Status   Specimen Description URINE, RANDOM   Final   Special Requests NONE   Final   Culture  Setup Time 07/10/2012 23:29   Final   Colony Count NO GROWTH   Final   Culture NO GROWTH   Final   Report Status 07/12/2012 FINAL   Final    Studies/Results: Dg Chest Port 1 View  07/11/2012  *RADIOLOGY REPORT*  Clinical Data: Fever.  PORTABLE CHEST - 1 VIEW  Comparison: 07/08/2012  Findings: AICD remains in place.  Atherosclerotic calcification of the aortic arch is present.  Heart size within normal limits for technique.  Probable enchondroma in the left proximal humerus.  Minimal blunting of the left lateral costophrenic angle is chronic and may be due to scarring.  IMPRESSION:  1.  Minimal scarring along the left costophrenic angle. 2.  Atherosclerotic aortic arch. 3.  No acute findings.   Original Report Authenticated By: Gaylyn Rong, M.D.     Assessment: He has not had any further fever since admission and all blood cultures remain negative. I doubt that his postoperative fevers were do to infection and would continue observation off of antibiotics.  Plan: 1. Continue observation off of antibiotics 2. Okay with me to discharge home again this afternoon  Cliffton Asters, MD [facility] [phone] pager   [phone] cell 07/12/2012, 1:41 PM

## 2012-07-12 NOTE — Progress Notes (Signed)
ANTICOAGULATION CONSULT NOTE - Follow Up Consult  Pharmacy Consult for Coumadin Indication: atrial fibrillation  Allergies  Allergen Reactions  . Other Other (See Comments)    Plastic tape tears skin    Patient Measurements: Height: 5\' 8"  (172.7 cm) Weight: 177 lb 7.5 oz (80.5 kg) IBW/kg (Calculated) : 68.4 Heparin Dosing Weight:   Vital Signs: Temp: 98.2 F (36.8 C) (05/11 0533) Temp src: Oral (05/11 0533) BP: 120/52 mmHg (05/11 0533) Pulse Rate: 71 (05/11 0533)  Labs:  Recent Labs  07/10/12 2020 07/11/12 0511 07/12/12 0635  HGB 12.0* 12.2*  --   HCT 35.2* 36.6*  --   PLT 127* 129*  --   LABPROT  --  16.5* 16.7*  INR  --  1.37 1.39  CREATININE 2.41* 2.27*  --     Estimated Creatinine Clearance: 26.8 ml/min (by C-G formula based on Cr of 2.27).   Medications:  Scheduled:  . carvedilol  6.25 mg Oral BID WC  . furosemide  40 mg Oral Daily  . gabapentin  300 mg Oral TID  . insulin aspart  0-9 Units Subcutaneous TID WC  . insulin aspart protamine- aspart  10 Units Subcutaneous BID WC  . losartan  100 mg Oral Daily  . sodium chloride  3 mL Intravenous Q12H  . [COMPLETED] warfarin  7.5 mg Oral ONCE-1800  . Warfarin - Pharmacist Dosing Inpatient   Does not apply q1800    Assessment: 78yo male with AFib, on Coumadin 2.5mg  bid prior to admission.  INR this AM 1.39, no problems noted.    Goal of Therapy:  INR 2-3 Monitor platelets by anticoagulation protocol: Yes   Plan:  1.  Repeat Coumadin 7.5mg  2.  F/U in AM  Marisue Humble, PharmD Clinical Pharmacist [facility]

## 2012-07-12 NOTE — Progress Notes (Signed)
Patient ID: [name], male   DOB: 24-May-1934, 78 y.o.   MRN: [id number]

## 2012-07-12 NOTE — Discharge Summary (Signed)
Physician Discharge Summary  [name] YNW:[id number] DOB: 1934-08-15 DOA: 07/10/2012  PCP: Nelwyn Salisbury, MD  Admit date: 07/10/2012 Discharge date: 07/12/2012   Recommendations for Outpatient Follow-up:  1. If patient has further fever he was told to take Tylenol and followup with his primary care physician. At this point in time we do not suspect bacteremia anymore 2. Patient has new onset Achilles tendon - tendinitis and he was encouraged to followup with his sports medicine physician Dr. Ethelene Hal  Discharge Diagnoses:  Fever - multiple cultures negative for infection-patient discharged without antibiotics   HYPERTENSION   CORONARY ARTERY DISEASE   Atrial fibrillation   Chronic systolic congestive heart failure   Diabetes   Discharge Condition: good, afebrile Diet recommendation: heart healthy Filed Weights   07/10/12 1852 07/11/12 0453  Weight: 80.8 kg (178 lb 2.1 oz) 80.5 kg (177 lb 7.5 oz)    History of present illness:  78 year old patient discharged 36 hours prior to admission who was currently readmitted for recurrent fevers.  Hospital Course:  Patient was brought back to the hospital and blood cultures and urine cultures were obtained again. To our surprise he remained afebrile through the entire admission. He was observed off antibiotics. Urine culture blood cultures were without growth. He was seen in consultation by Dr. Cliffton Asters from infectious diseases and we've decided to discharge patient home on no antibiotics. The patient will call his primary care physician if he has fever again  Procedures: none Consultations: Infectious diseases Discharge Exam: Filed Vitals:   07/11/12 1430 07/11/12 2202 07/12/12 0533 07/12/12 1414  BP: 134/57 131/59 120/52 125/63  Pulse: 73 69 71 72  Temp: 98.1 F (36.7 C) 98.2 F (36.8 C) 98.2 F (36.8 C) 98 F (36.7 C)  TempSrc: Oral Oral Oral Oral  Resp: 18 18 18 15   Height:      Weight:      SpO2: 98% 97% 97% 100%     General: alert and oriented x3 Respiratory: clear to auscultation bilaterally  Discharge Instructions  Discharge Orders   Future Appointments Provider Department Dept Phone   07/14/2012 8:30 AM Baker Pierini, FNP  [facility] [facility] [phone]   08/11/2012 1:00 PM [facility] [phone]   08/11/2012 1:30 PM [facility] [phone]   08/11/2012 2:00 PM Pryor Ochoa, MD [facility] [phone]   Future Orders Complete By Expires     Diet - low sodium heart healthy  As directed     Increase activity slowly  As directed         Medication List    TAKE these medications       carvedilol 6.25 MG tablet  Commonly known as:  COREG  Take 1 tablet (6.25 mg total) by mouth 2 (two) times daily with a meal.     finasteride 1 MG tablet  Commonly known as:  PROPECIA  Take 1 mg by mouth daily.     furosemide 40 MG tablet  Commonly known as:  LASIX  Take 1 tablet (40 mg total) by mouth daily.     gabapentin 300 MG capsule  Commonly known as:  NEURONTIN  Take 300 mg by mouth 3 (three) times daily.     insulin aspart protamine- aspart (70-30) 100 UNIT/ML injection  Commonly known as:  NOVOLOG 70/30  Inject 10 Units into the skin 2 (two) times daily with a meal. 10 units with breakfast,  and 10 units with the evening meal     losartan 100 MG tablet  Commonly known as:  COZAAR  Take 1 tablet (100 mg total) by mouth daily.     NITROSTAT 0.4 MG SL tablet  Generic drug:  nitroGLYCERIN  Place 0.4 mg under the tongue every 5 (five) minutes as needed for chest pain. For chest pain, max 3 doses     vitamin E 1000 UNIT capsule  Take 1,000 Units by mouth 3 (three) times a week.     warfarin 5 MG tablet  Commonly known as:  COUMADIN  Take 2.5 mg by mouth 2 (two) times daily.       Allergies  Allergen Reactions  . Other Other (See Comments)     Plastic tape tears skin      The results of significant diagnostics from this hospitalization (including imaging, microbiology, ancillary and laboratory) are listed below for reference.    Significant Diagnostic Studies: Ct Head Wo Contrast  07/08/2012  *RADIOLOGY REPORT*  Clinical Data: 78 year old male with altered mental status. Inappropriate.  CT HEAD WITHOUT CONTRAST  Technique:  Contiguous axial images were obtained from the base of the skull through the vertex without contrast.  Comparison: 09/18/2007.  Findings: Visualized paranasal sinuses and mastoids are clear.  No acute osseous abnormality identified.  Visualized orbits and scalp soft tissues are within normal limits.  Calcified atherosclerosis at the skull base.  Mild generalized cerebral volume loss since 2009.  No ventriculomegaly. No midline shift, mass effect, or evidence of mass lesion.  Minimal to mild subcortical white matter hypodensity is new.  Otherwise stable and normal gray-white matter differentiation. No evidence of cortically based acute infarction identified.  No acute intracranial hemorrhage identified.  No suspicious intracranial vascular hyperdensity.  IMPRESSION: No acute intracranial abnormality.  Mild generalized volume loss and nonspecific subcortical white matter changes since 2009.   Original Report Authenticated By: Erskine Speed, M.D.    Dg Chest Port 1 View  07/11/2012  *RADIOLOGY REPORT*  Clinical Data: Fever.  PORTABLE CHEST - 1 VIEW  Comparison: 07/08/2012  Findings: AICD remains in place.  Atherosclerotic calcification of the aortic arch is present.  Heart size within normal limits for technique.  Probable enchondroma in the left proximal humerus.  Minimal blunting of the left lateral costophrenic angle is chronic and may be due to scarring.  IMPRESSION:  1.  Minimal scarring along the left costophrenic angle. 2.  Atherosclerotic aortic arch. 3.  No acute findings.   Original Report Authenticated By: Gaylyn Rong, M.D.    Dg Chest Port 1 View  07/08/2012  *RADIOLOGY REPORT*  Clinical Data: 78 year old male with fever, altered mental status. Recent angioplasty.  PORTABLE CHEST - 1 VIEW  Comparison: 04/01/2012 and earlier.  Findings: AP portable semi upright view at 0256 hours.  Stable left chest three lead cardiac AICD.  Stable cardiac size and mediastinal contours.  Visualized tracheal air column is within normal limits. No pneumothorax or pulmonary edema.  Stable mild blunting of the left costophrenic angle.  No pleural effusion or acute pulmonary opacity.  Multiple EKG leads and wires overlie the chest.  IMPRESSION: No acute cardiopulmonary abnormality.   Original Report Authenticated By: Erskine Speed, M.D.     Microbiology: Recent Results (from the past 240 hour(s))  CULTURE, BLOOD (ROUTINE X 2)     Status: None   Collection Time    07/08/12  2:50 AM      Result Value Range Status  Specimen Description BLOOD RIGHT ARM   Final   Special Requests BOTTLES DRAWN AEROBIC AND ANAEROBIC 10CC   Final   Culture  Setup Time 07/08/2012 09:55   Final   Culture     Final   Value:        BLOOD CULTURE RECEIVED NO GROWTH TO DATE CULTURE WILL BE HELD FOR 5 DAYS BEFORE ISSUING A FINAL NEGATIVE REPORT   Report Status PENDING   Incomplete  CULTURE, BLOOD (ROUTINE X 2)     Status: None   Collection Time    07/08/12  3:00 AM      Result Value Range Status   Specimen Description BLOOD RIGHT HAND   Final   Special Requests BOTTLES DRAWN AEROBIC ONLY 10CC   Final   Culture  Setup Time 07/08/2012 09:56   Final   Culture     Final   Value:        BLOOD CULTURE RECEIVED NO GROWTH TO DATE CULTURE WILL BE HELD FOR 5 DAYS BEFORE ISSUING A FINAL NEGATIVE REPORT   Report Status PENDING   Incomplete  CULTURE, BLOOD (ROUTINE X 2)     Status: None   Collection Time    07/10/12  8:15 PM      Result Value Range Status   Specimen Description BLOOD LEFT ARM   Final   Special Requests BOTTLES DRAWN AEROBIC ONLY 5CC    Final   Culture  Setup Time 07/11/2012 05:05   Final   Culture     Final   Value:        BLOOD CULTURE RECEIVED NO GROWTH TO DATE CULTURE WILL BE HELD FOR 5 DAYS BEFORE ISSUING A FINAL NEGATIVE REPORT   Report Status PENDING   Incomplete  CULTURE, BLOOD (ROUTINE X 2)     Status: None   Collection Time    07/10/12  8:20 PM      Result Value Range Status   Specimen Description BLOOD LEFT ARM   Final   Special Requests BOTTLES DRAWN AEROBIC ONLY 7CC   Final   Culture  Setup Time 07/11/2012 05:05   Final   Culture     Final   Value:        BLOOD CULTURE RECEIVED NO GROWTH TO DATE CULTURE WILL BE HELD FOR 5 DAYS BEFORE ISSUING A FINAL NEGATIVE REPORT   Report Status PENDING   Incomplete  URINE CULTURE     Status: None   Collection Time    07/10/12  9:43 PM      Result Value Range Status   Specimen Description URINE, RANDOM   Final   Special Requests NONE   Final   Culture  Setup Time 07/10/2012 23:29   Final   Colony Count NO GROWTH   Final   Culture NO GROWTH   Final   Report Status 07/12/2012 FINAL   Final     Labs: Basic Metabolic Panel:  Recent Labs Lab 07/08/12 0215 07/08/12 0750 07/09/12 0500 07/10/12 2020 07/11/12 0511  NA 135 136 139 139 135  K 4.8 4.9 4.7 4.9 4.7  CL 105 106 107 105 103  CO2 21 21 21 24 22   GLUCOSE 91 108* 88 56* 114*  BUN 43* 41* 34* 38* 36*  CREATININE 2.16* 2.21* 2.19* 2.41* 2.27*  CALCIUM 8.3* 8.4 8.5 8.9 8.8   Liver Function Tests:  Recent Labs Lab 07/08/12 0215 07/08/12 0750 07/10/12 2020  AST 17 15 28   ALT 10 9 23   ALKPHOS  56 55 69  BILITOT 0.6 0.8 0.6  PROT 6.4 6.2 6.7  ALBUMIN 3.2* 3.0* 3.1*   No results found for this basename: LIPASE, AMYLASE,  in the last 168 hours No results found for this basename: AMMONIA,  in the last 168 hours CBC:  Recent Labs Lab 07/08/12 0215 07/08/12 0750 07/09/12 0500 07/10/12 2020 07/11/12 0511  WBC 11.3* 14.3* 10.3 9.4 7.6  NEUTROABS  --  11.3*  --  6.8  --   HGB 13.1 12.8* 11.6*  12.0* 12.2*  HCT 38.1* 37.4* 34.1* 35.2* 36.6*  MCV 83.2 83.5 84.4 85.4 84.9  PLT 135* 122* 112* 127* 129*   Cardiac Enzymes: No results found for this basename: CKTOTAL, CKMB, CKMBINDEX, TROPONINI,  in the last 168 hours BNP: BNP (last 3 results) No results found for this basename: PROBNP,  in the last 8760 hours CBG:  Recent Labs Lab 07/11/12 1140 07/11/12 1729 07/11/12 2200 07/12/12 0735 07/12/12 1220  GLUCAP 208* 88 118* 106* 222*       Signed:  Naiyana Barbian  [facility] 07/12/2012, 4:44 PM

## 2012-07-14 ENCOUNTER — Ambulatory Visit (INDEPENDENT_AMBULATORY_CARE_PROVIDER_SITE_OTHER): Payer: Medicare Other | Admitting: Family

## 2012-07-14 ENCOUNTER — Encounter: Payer: Self-pay | Admitting: Family Medicine

## 2012-07-14 ENCOUNTER — Ambulatory Visit (INDEPENDENT_AMBULATORY_CARE_PROVIDER_SITE_OTHER): Payer: Medicare Other | Admitting: Family Medicine

## 2012-07-14 VITALS — BP 108/54 | HR 76 | Temp 97.7°F | Wt 184.0 lb

## 2012-07-14 DIAGNOSIS — I4891 Unspecified atrial fibrillation: Secondary | ICD-10-CM

## 2012-07-14 DIAGNOSIS — R509 Fever, unspecified: Secondary | ICD-10-CM

## 2012-07-14 DIAGNOSIS — N259 Disorder resulting from impaired renal tubular function, unspecified: Secondary | ICD-10-CM

## 2012-07-14 DIAGNOSIS — I10 Essential (primary) hypertension: Secondary | ICD-10-CM

## 2012-07-14 LAB — CULTURE, BLOOD (ROUTINE X 2)
Culture: NO GROWTH
Culture: NO GROWTH

## 2012-07-14 LAB — POCT INR: INR: 3.8

## 2012-07-14 NOTE — Progress Notes (Signed)
  Subjective:    Patient ID: [name], male    DOB: 1934-03-24, 78 y.o.   MRN: [id number]  HPI Here to follow up two recent hospital stays for fever of unknown origin. He was there from 07-08-12 to 07-09-12 for fevers to 102 degrees and no other specific symptoms. His workup was negative, including labs, CT scans, CXR, and blood and urine cultures. He went home but then developed fevers again and was readmitted from 07-10-12 to 07-12-12. This time Dr. Cliffton Asters from Infectious Disease was consulted. Again all tests were normal. His fevers quickly resolved and he has been fine since then. Now he is home and feels great other than some general fatigue. All final cultures were negative for 5 days.    Review of Systems  Constitutional: Positive for fatigue. Negative for fever, chills and diaphoresis.  Respiratory: Negative.   Cardiovascular: Negative.   Gastrointestinal: Negative.   Endocrine: Negative.   Genitourinary: Negative.   Neurological: Negative.        Objective:   Physical Exam  Constitutional: He is oriented to person, place, and time. He appears well-developed and well-nourished. No distress.  Neck: No thyromegaly present.  Cardiovascular: Normal rate, regular rhythm, normal heart sounds and intact distal pulses.   Pulmonary/Chest: Effort normal.  Abdominal: Soft. Bowel sounds are normal. He exhibits no distension and no mass. There is no tenderness. There is no rebound and no guarding.  Lymphadenopathy:    He has no cervical adenopathy.  Neurological: He is alert and oriented to person, place, and time.          Assessment & Plan:  Fever of unknown origin, probably viral, which seems to have resolved. Follow up prn

## 2012-07-14 NOTE — Patient Instructions (Addendum)
   Hold Coumadin tomorrow. Resume at 1/2 tab daily. Recheck in 7-8 days  Anticoagulation Dose Instructions as of 07/14/2012     Glynis Smiles Tue Wed Thu Fri Sat   New Dose 2.5 mg 2.5 mg 2.5 mg 2.5 mg 2.5 mg 2.5 mg 2.5 mg    Description       Hold Coumadin tomorrow. Resume at 1/2 tab daily. Recheck in 7-8 days.

## 2012-07-17 LAB — CULTURE, BLOOD (ROUTINE X 2): Culture: NO GROWTH

## 2012-07-22 ENCOUNTER — Ambulatory Visit (INDEPENDENT_AMBULATORY_CARE_PROVIDER_SITE_OTHER): Payer: Medicare Other | Admitting: Family

## 2012-07-22 DIAGNOSIS — I4891 Unspecified atrial fibrillation: Secondary | ICD-10-CM

## 2012-07-22 NOTE — Patient Instructions (Addendum)
Hold Coumadin Wednesday and Thursday.Marland Kitchen Resume Friday at 1/2 tab daily, except none on Tuesday and Thursday. Recheck in 2 weeks.  Anticoagulation Dose Instructions as of 07/22/2012     Glynis Smiles Tue Wed Thu Fri Sat   New Dose 2.5 mg 2.5 mg 0 mg 2.5 mg 0 mg 2.5 mg 2.5 mg    Description       Hold Coumadin Wednesday and Thursday.Marland Kitchen Resume Friday at 1/2 tab daily, except none on Tuesday and Thursday. Recheck in 2 weeks.

## 2012-08-04 ENCOUNTER — Ambulatory Visit (INDEPENDENT_AMBULATORY_CARE_PROVIDER_SITE_OTHER): Payer: Medicare Other | Admitting: Family

## 2012-08-04 DIAGNOSIS — I4891 Unspecified atrial fibrillation: Secondary | ICD-10-CM

## 2012-08-04 NOTE — Patient Instructions (Addendum)
Hold Coumadin Today Take 1/2 tab daily, except none on Tuesday, Thursday and Saturday. Recheck in 2 weeks.  Anticoagulation Dose Instructions as of 08/04/2012     Glynis Smiles Tue Wed Thu Fri Sat   New Dose 2.5 mg 2.5 mg 0 mg 2.5 mg 0 mg 2.5 mg 0 mg    Description       Hold Coumadin Today Take 1/2 tab daily, except none on Tuesday, Thursday and Saturday. Recheck in 2 weeks.

## 2012-08-10 ENCOUNTER — Encounter (INDEPENDENT_AMBULATORY_CARE_PROVIDER_SITE_OTHER): Payer: Medicare Other | Admitting: *Deleted

## 2012-08-10 ENCOUNTER — Other Ambulatory Visit (INDEPENDENT_AMBULATORY_CARE_PROVIDER_SITE_OTHER): Payer: Medicare Other | Admitting: *Deleted

## 2012-08-10 DIAGNOSIS — Z48812 Encounter for surgical aftercare following surgery on the circulatory system: Secondary | ICD-10-CM

## 2012-08-10 DIAGNOSIS — Z01810 Encounter for preprocedural cardiovascular examination: Secondary | ICD-10-CM

## 2012-08-10 DIAGNOSIS — I739 Peripheral vascular disease, unspecified: Secondary | ICD-10-CM

## 2012-08-10 DIAGNOSIS — R0989 Other specified symptoms and signs involving the circulatory and respiratory systems: Secondary | ICD-10-CM

## 2012-08-11 ENCOUNTER — Ambulatory Visit: Payer: Medicare Other | Admitting: Vascular Surgery

## 2012-08-13 ENCOUNTER — Telehealth: Payer: Self-pay | Admitting: Family Medicine

## 2012-08-13 NOTE — Telephone Encounter (Signed)
Refill request for Finasteride 5 mg take 1 po qd and a 90 day supply to [facility].

## 2012-08-14 ENCOUNTER — Other Ambulatory Visit: Payer: Self-pay

## 2012-08-14 MED ORDER — WARFARIN SODIUM 5 MG PO TABS: 2.5000 mg | ORAL_TABLET | Freq: Two times a day (BID) | ORAL | Status: DC

## 2012-08-14 MED ORDER — FINASTERIDE 5 MG PO TABS: 5.0000 mg | ORAL_TABLET | Freq: Every day | ORAL | Status: DC

## 2012-08-14 NOTE — Telephone Encounter (Signed)
I sent script e-scribe. 

## 2012-08-14 NOTE — Telephone Encounter (Signed)
Okay for one year  

## 2012-08-17 ENCOUNTER — Encounter: Payer: Self-pay | Admitting: Vascular Surgery

## 2012-08-18 ENCOUNTER — Other Ambulatory Visit: Payer: Self-pay

## 2012-08-18 ENCOUNTER — Ambulatory Visit (INDEPENDENT_AMBULATORY_CARE_PROVIDER_SITE_OTHER): Payer: Medicare Other | Admitting: Family

## 2012-08-18 ENCOUNTER — Ambulatory Visit (INDEPENDENT_AMBULATORY_CARE_PROVIDER_SITE_OTHER): Payer: Medicare Other | Admitting: Vascular Surgery

## 2012-08-18 ENCOUNTER — Encounter: Payer: Self-pay | Admitting: Vascular Surgery

## 2012-08-18 VITALS — BP 116/54 | HR 73 | Resp 18 | Ht 68.0 in | Wt 190.0 lb

## 2012-08-18 DIAGNOSIS — I771 Stricture of artery: Secondary | ICD-10-CM

## 2012-08-18 DIAGNOSIS — I739 Peripheral vascular disease, unspecified: Secondary | ICD-10-CM

## 2012-08-18 DIAGNOSIS — Z01810 Encounter for preprocedural cardiovascular examination: Secondary | ICD-10-CM

## 2012-08-18 DIAGNOSIS — R200 Anesthesia of skin: Secondary | ICD-10-CM

## 2012-08-18 DIAGNOSIS — R209 Unspecified disturbances of skin sensation: Secondary | ICD-10-CM

## 2012-08-18 DIAGNOSIS — I4891 Unspecified atrial fibrillation: Secondary | ICD-10-CM

## 2012-08-18 DIAGNOSIS — R42 Dizziness and giddiness: Secondary | ICD-10-CM

## 2012-08-18 MED ORDER — WARFARIN SODIUM 5 MG PO TABS: ORAL_TABLET | ORAL | Status: DC

## 2012-08-18 NOTE — Progress Notes (Signed)
Subjective:     Patient ID: [name], male   DOB: 06/11/1934, 78 y.o.   MRN: [id number]  HPI this 78 year old male returns for followup regarding his left femoral to anterior tibial bypass graft. He recently had angioplasty performed by Dr. Imogene Burn of 3 separate lesions in the left femoral to anterior tibial vein graft. The distal 6-8 cm of the vein graft is quite small in caliber. He has not had specific left leg claudication symptoms at this time. He does report severe claudication in the left upper extremity with numbness and tingling in the left hand with minimal exertion. He has some occasional visual symptoms and dizziness but no syncope. He does have a pacemaker.  Past Medical History  Diagnosis Date  . Hyperlipidemia   . Gout   . Benign prostatic hypertrophy   . Atrial fibrillation     sees Dr. Verdis Prime  . Chronic systolic dysfunction of left ventricle     a. mixed ischemic and nonischemic CM,  EF 35%. b. s/p AICD implantation.  . CHF (congestive heart failure)   . PAD (peripheral artery disease)     Severe by PV angiogram 09/2011  . ED (erectile dysfunction)   . Renal artery stenosis     s/p stenting 2009  . Arthritis   . Pacemaker     medtronic  . CAD (coronary artery disease)     a. s/p mid LAD stenting with DES 2008 Dr. Verdis [facility]  . ICD (implantable cardiac defibrillator) in place     medtronic, Dr. Johney Frame  . ICD (implantable cardiac defibrillator) in place     due for check in Feb/2013  . Sleep apnea     hx of "had surgery for"  . Hypertension     sees Dr. Claris Che  . Type II diabetes mellitus     History  Substance Use Topics  . Smoking status: Never Smoker   . Smokeless tobacco: Never Used     Comment: 1-2 cigars when golfing  . Alcohol Use: 2.4 oz/week    2 Glasses of wine, 2 Shots of liquor per week     Comment: 07/10/2012 "galss of wine or vodka tonic 3 X/wk"    Family History  Problem Relation Age of Onset  . Cancer      breast/fhx  . Heart  disease      fhx  . Diabetes Neg Hx   . Cancer Father     Allergies  Allergen Reactions  . Other Other (See Comments)    Plastic tape tears skin    Current outpatient prescriptions:carvedilol (COREG) 6.25 MG tablet, Take 1 tablet (6.25 mg total) by mouth 2 (two) times daily with a meal., Disp: 180 tablet, Rfl: 3;  finasteride (PROSCAR) 5 MG tablet, Take 1 tablet (5 mg total) by mouth daily., Disp: 90 tablet, Rfl: 3;  furosemide (LASIX) 40 MG tablet, Take 1 tablet (40 mg total) by mouth daily., Disp: 90 tablet, Rfl: 3 gabapentin (NEURONTIN) 300 MG capsule, Take 300 mg by mouth 3 (three) times daily. , Disp: , Rfl: ;  insulin aspart protamine-insulin aspart (NOVOLOG 70/30) (70-30) 100 UNIT/ML injection, Inject 10 Units into the skin 2 (two) times daily with a meal. 10 units with breakfast, and 10 units with the evening meal, Disp: , Rfl: ;  losartan (COZAAR) 100 MG tablet, Take 1 tablet (100 mg total) by mouth daily., Disp: 90 tablet, Rfl: 3 NITROSTAT 0.4 MG SL tablet, Place 0.4 mg under the tongue every  5 (five) minutes as needed for chest pain. For chest pain, max 3 doses, Disp: , Rfl: ;  warfarin (COUMADIN) 5 MG tablet, Take 2.5mg  daily except none on Thrusday, Disp: 90 tablet, Rfl: 0  BP 116/54  Pulse 73  Resp 18  Ht 5\' 8"  (1.727 m)  Wt 190 lb (86.183 kg)  BMI 28.9 kg/m2  Body mass index is 28.9 kg/(m^2).          Review of Systems denies chest pain but does have dyspnea on exertion and easy fatigability. No lower extremity claudication. He does have left upper extremity claudication to    Objective:   Physical Exam blood pressure 116/54 the right upper extremity heart rate 73 respirations 18 General well-developed well-nourished male in no apparent stress alert and oriented x3 3+ carotid pulses bilaterally absent left upper trinity pulses palpable brachial and radial level. Right upper extremity with 2+ brachial and radial pulses.  Left lower extremity with 3+ femoral and  vein graft pulse at knee level with 2+ dorsalis pedis pulse palpable.  Today I reviewed the duplex scan which was performed last week of the left lower extremity bypass graft. There no evidence of significant stenosis in the vein graft present time. 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

## 2012-08-18 NOTE — Patient Instructions (Addendum)
Take 1/2 tab daily, except none on Thursday. Recheck in 3 weeks.  Anticoagulation Dose Instructions as of 08/18/2012     [name] Tue Wed Thu Fri Sat   New Dose 2.5 mg 2.5 mg 2.5 mg 2.5 mg 0 mg 2.5 mg 2.5 mg    Description        Take 1/2 tab daily, except none on Thursday. Recheck in 3 weeks.

## 2012-08-18 NOTE — Addendum Note (Signed)
Addended by: Sharee Pimple on: 08/18/2012 02:46 PM   Modules accepted: Orders

## 2012-08-24 ENCOUNTER — Telehealth: Payer: Self-pay | Admitting: Family Medicine

## 2012-08-24 NOTE — Telephone Encounter (Signed)
Refill for one year 

## 2012-08-24 NOTE — Telephone Encounter (Signed)
Can we refill this? 

## 2012-08-24 NOTE — Telephone Encounter (Signed)
Pt needs refill on gabapentin 300 mg three times a day #270 with refills  call into [facility]  [phone]

## 2012-08-25 MED ORDER — GABAPENTIN 300 MG PO CAPS: 300.0000 mg | ORAL_CAPSULE | Freq: Three times a day (TID) | ORAL | Status: DC

## 2012-08-25 NOTE — Telephone Encounter (Signed)
I sent script e-scribe. 

## 2012-09-07 ENCOUNTER — Ambulatory Visit (INDEPENDENT_AMBULATORY_CARE_PROVIDER_SITE_OTHER): Payer: Medicare Other | Admitting: General Practice

## 2012-09-07 ENCOUNTER — Other Ambulatory Visit: Payer: Self-pay | Admitting: Vascular Surgery

## 2012-09-07 ENCOUNTER — Telehealth: Payer: Self-pay | Admitting: Family Medicine

## 2012-09-07 ENCOUNTER — Encounter: Payer: Self-pay | Admitting: Vascular Surgery

## 2012-09-07 DIAGNOSIS — I4891 Unspecified atrial fibrillation: Secondary | ICD-10-CM

## 2012-09-07 LAB — POCT INR: INR: 2.1

## 2012-09-07 LAB — BUN: BUN: 56 mg/dL — ABNORMAL HIGH (ref 6–23)

## 2012-09-07 NOTE — Telephone Encounter (Signed)
Pt needs a note/ letter stating he had a CPE and everything was ok. Pt received earlier letter, but it was too soon for him to send the letter due to insurance restrictions, now needs the letter done again.  Ok to be hand written. Pt will fax to appropriate people.

## 2012-09-07 NOTE — Telephone Encounter (Signed)
I don't have a record of this note. What is it for? Who is he going to give it to?

## 2012-09-08 ENCOUNTER — Other Ambulatory Visit: Payer: Self-pay | Admitting: *Deleted

## 2012-09-08 ENCOUNTER — Encounter: Payer: Self-pay | Admitting: Vascular Surgery

## 2012-09-08 ENCOUNTER — Inpatient Hospital Stay: Admission: RE | Admit: 2012-09-08 | Payer: Medicare Other | Source: Ambulatory Visit

## 2012-09-08 ENCOUNTER — Encounter (INDEPENDENT_AMBULATORY_CARE_PROVIDER_SITE_OTHER): Payer: Medicare Other | Admitting: Vascular Surgery

## 2012-09-08 ENCOUNTER — Ambulatory Visit: Payer: Medicare Other | Admitting: Vascular Surgery

## 2012-09-08 ENCOUNTER — Encounter: Payer: Medicare Other | Admitting: Family

## 2012-09-08 ENCOUNTER — Ambulatory Visit (INDEPENDENT_AMBULATORY_CARE_PROVIDER_SITE_OTHER): Payer: Medicare Other | Admitting: Vascular Surgery

## 2012-09-08 ENCOUNTER — Encounter (INDEPENDENT_AMBULATORY_CARE_PROVIDER_SITE_OTHER): Payer: Medicare Other | Admitting: *Deleted

## 2012-09-08 VITALS — BP 107/60 | HR 75 | Ht 68.0 in | Wt 188.0 lb

## 2012-09-08 DIAGNOSIS — I739 Peripheral vascular disease, unspecified: Secondary | ICD-10-CM

## 2012-09-08 DIAGNOSIS — Z01810 Encounter for preprocedural cardiovascular examination: Secondary | ICD-10-CM

## 2012-09-08 DIAGNOSIS — M79609 Pain in unspecified limb: Secondary | ICD-10-CM

## 2012-09-08 DIAGNOSIS — I771 Stricture of artery: Secondary | ICD-10-CM | POA: Insufficient documentation

## 2012-09-08 DIAGNOSIS — Z48812 Encounter for surgical aftercare following surgery on the circulatory system: Secondary | ICD-10-CM

## 2012-09-08 NOTE — Progress Notes (Signed)
Subjective:     Patient ID: [name], male   DOB: 07/19/1934, 78 y.o.   MRN: [id number]  HPI this 78-year-old male returns for continued followup regarding his left lower extremity bypass graft. This has been revised and replaced using vein from both lower extremities. He has known multiple areas of vein graft stenosis which are treated with angioplasty recently by Dr. Chien. Patient states over the past week he has only been able to walk about 10 yards without discomfort the left calf. He does not have rest pain. He also has severe fatigue and weakening of the left upper extremity secondary to left subclavian occlusion. He has a pacemaker. He has renal insufficiency with a creatinine of 2.68. He is not a candidate for an MRI because of pacemaker and he cannot get a CT angiogram because of the creatinine.  Past Medical History  Diagnosis Date  . Hyperlipidemia   . Gout   . Benign prostatic hypertrophy   . Atrial fibrillation     sees Dr. Henry Smith  . Chronic systolic dysfunction of left ventricle     a. mixed ischemic and nonischemic CM,  EF 35%. b. s/p AICD implantation.  . CHF (congestive heart failure)   . PAD (peripheral artery disease)     Severe by PV angiogram 09/2011  . ED (erectile dysfunction)   . Renal artery stenosis     s/p stenting 2009  . Arthritis   . Pacemaker     medtronic  . CAD (coronary artery disease)     a. s/p mid LAD stenting with DES 2008 Dr. Henry Smith  . ICD (implantable cardiac defibrillator) in place     medtronic, Dr. Allred  . ICD (implantable cardiac defibrillator) in place     due for check in Feb/2013  . Sleep apnea     hx of "had surgery for"  . Hypertension     sees Dr. S. Fry  . Type II diabetes mellitus     History  Substance Use Topics  . Smoking status: Never Smoker   . Smokeless tobacco: Never Used     Comment: 1-2 cigars when golfing  . Alcohol Use: 2.4 oz/week    2 Glasses of wine, 2 Shots of liquor per week     Comment:  07/10/2012 "galss of wine or vodka tonic 3 X/wk"    Family History  Problem Relation Age of Onset  . Cancer      breast/fhx  . Heart disease      fhx  . Diabetes Neg Hx   . Cancer Father     Allergies  Allergen Reactions  . Other Other (See Comments)    Plastic tape tears skin    Current outpatient prescriptions:carvedilol (COREG) 6.25 MG tablet, Take 1 tablet (6.25 mg total) by mouth 2 (two) times daily with a meal., Disp: 180 tablet, Rfl: 3;  finasteride (PROSCAR) 5 MG tablet, Take 1 tablet (5 mg total) by mouth daily., Disp: 90 tablet, Rfl: 3;  furosemide (LASIX) 40 MG tablet, Take 1 tablet (40 mg total) by mouth daily., Disp: 90 tablet, Rfl: 3 gabapentin (NEURONTIN) 300 MG capsule, Take 1 capsule (300 mg total) by mouth 3 (three) times daily., Disp: 270 capsule, Rfl: 3;  insulin aspart protamine-insulin aspart (NOVOLOG 70/30) (70-30) 100 UNIT/ML injection, Inject 10 Units into the skin 2 (two) times daily with a meal. 10 units with breakfast, and 10 units with the evening meal, Disp: , Rfl:  losartan (COZAAR) 100 MG   tablet, Take 1 tablet (100 mg total) by mouth daily., Disp: 90 tablet, Rfl: 3;  NITROSTAT 0.4 MG SL tablet, Place 0.4 mg under the tongue every 5 (five) minutes as needed for chest pain. For chest pain, max 3 doses, Disp: , Rfl: ;  warfarin (COUMADIN) 5 MG tablet, Take 2.5mg daily except none on Thrusday, Disp: 90 tablet, Rfl: 0  BP 107/60  Pulse 75  Ht 5' 8" (1.727 m)  Wt 188 lb (85.276 kg)  BMI 28.59 kg/m2  SpO2 100%  Body mass index is 28.59 kg/(m^2).          Review of Systems denies chest pain, dyspnea on exertion, PND, orthopnea. He does complain his left arm weakness and left leg claudication    Objective:   Physical Exam blood pressure 107/60 heart rate 75 respirations 16 General well-developed well-nourished male in no apparent stress alert and oriented x3 Lungs no rhonchi or wheezing Left upper extremity with absent pulses Left leg with a 2+  pulse in the subcutaneous femoral anterior tibial graft. The vein becomes quite small below the knee. No palpable dorsalis pedis pulse. Distal flow is somewhat muffled.   Today I ordered a duplex scan of the left femoral anterior tibial graft. There is recurrent stenosis in the proximal portion of the vein graft and the vein becomes quite small distally as none also there is a focal severe stenosis proximal to the distal anastomosis the anterior tibial artery.  Also ordered vein mapping today in the left upper extremity. Cephalic vein is small and not adequate. The basilic vein is adequate from the antecubital area to the axilla. 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 We need to evaluate his left subclavian artery as well but it is of secondary importance.    Plan:     #1 we'll discontinue Coumadin after Friday's dose and plan angiography of left leg by Dr. Brabham on Tuesday, July 15. We'll post him for revision of left femoral anterior tibial graft on Wednesday using basilic vein graft left arm to replace the lower portion of the bypass. If feasible would also like a selective left subclavian angiogram with small amount of contrast at the same setting since patient will be off of his Coumadin Obviously there are issues here with renal insufficiency. Possibly can use CO2 for the lower extremity study with a very small amount of contrast and possibly could do one small injection the left subclavian artery since patient is not candidate for CT angiogram or MRA      

## 2012-09-08 NOTE — Telephone Encounter (Signed)
Pt is going to fax over the note, he did save a copy and then he wants [place] to fax it to the insurance company.

## 2012-09-09 ENCOUNTER — Encounter (HOSPITAL_COMMUNITY): Payer: Self-pay | Admitting: Pharmacy Technician

## 2012-09-09 ENCOUNTER — Telehealth: Payer: Self-pay | Admitting: Family Medicine

## 2012-09-09 NOTE — Telephone Encounter (Signed)
Pt needs a letter written and dated for July 2014. Fax to [employer] at [phone]. Please see previous letter that you wrote. There is a copy at my desk to review.

## 2012-09-10 ENCOUNTER — Other Ambulatory Visit: Payer: Self-pay

## 2012-09-10 ENCOUNTER — Telehealth: Payer: Self-pay | Admitting: Family Medicine

## 2012-09-10 ENCOUNTER — Encounter: Payer: Self-pay | Admitting: Vascular Surgery

## 2012-09-10 ENCOUNTER — Telehealth: Payer: Self-pay | Admitting: General Practice

## 2012-09-10 NOTE — Telephone Encounter (Signed)
Message copied by Garrison Columbus on Thu Sep 10, 2012 11:42 AM ------      Message from: [name] D      Created: Thu Sep 10, 2012 11:33 AM       Nonnecessary for Lovenox bridge      Dr. Hart Rochester      ----- Message -----         From: Garrison Columbus, RN         Sent: 09/10/2012   9:52 AM           To: Pryor Ochoa, MD            Pt to have angio on 7/15 and bypass revision on 7/16.  Do you want pt to have Lovenox bridge?  Thanks.            Bailey Mech, RN      [facility]       ------

## 2012-09-10 NOTE — Telephone Encounter (Signed)
Spoke with pt to inform that Lovenox bridge will not be needed in regards to procedure on 7/15 per Dr. Hart Rochester.  See note dated 7/10.  Patient verbalized understanding.

## 2012-09-10 NOTE — Telephone Encounter (Signed)
[name] is calling to report that the pt will undergo an angio on 7/15, and a bypass revision on 7/16. She states that he will need to come off of his coumadin tomorrow. She is inquiring to see if he will need to be bridged at all. Please call her with patient instructions and she will speak with the pt further. Thank you!

## 2012-09-10 NOTE — Telephone Encounter (Signed)
Please advise 

## 2012-09-14 MED ORDER — SODIUM CHLORIDE 0.9 % IV SOLN: INTRAVENOUS | Status: DC

## 2012-09-14 MED ORDER — DEXTROSE 5 % IV SOLN
1.5000 g | INTRAVENOUS | Status: DC
  Filled 2012-09-14: qty 1.5

## 2012-09-14 MED ORDER — SODIUM CHLORIDE 0.9 % IV SOLN
INTRAVENOUS | Status: DC
  Administered 2012-09-15: 100 mL/h via INTRAVENOUS

## 2012-09-15 ENCOUNTER — Encounter (HOSPITAL_COMMUNITY): Payer: Medicare Other

## 2012-09-15 ENCOUNTER — Other Ambulatory Visit: Payer: Self-pay

## 2012-09-15 ENCOUNTER — Ambulatory Visit (HOSPITAL_COMMUNITY)
Admission: RE | Admit: 2012-09-15 | Discharge: 2012-09-15 | Disposition: A | Discharge status: Home / Self Care | Payer: Medicare Other | Source: Ambulatory Visit | Attending: Surgery | Admitting: Surgery

## 2012-09-15 ENCOUNTER — Encounter (HOSPITAL_COMMUNITY)
Admission: RE | Disposition: A | Discharge status: Home / Self Care | Payer: Self-pay | Source: Ambulatory Visit | Attending: Surgery

## 2012-09-15 ENCOUNTER — Encounter (HOSPITAL_COMMUNITY): Payer: Self-pay | Admitting: *Deleted

## 2012-09-15 DIAGNOSIS — I739 Peripheral vascular disease, unspecified: Secondary | ICD-10-CM

## 2012-09-15 HISTORY — PX: LOWER EXTREMITY ANGIOGRAM: SHX5508

## 2012-09-15 HISTORY — PX: UPPER EXTREMITY ANGIOGRAM: SHX6310

## 2012-09-15 LAB — PROTIME-INR
INR: 1.39 (ref 0.00–1.49)
Prothrombin Time: 16.7 seconds — ABNORMAL HIGH (ref 11.6–15.2)

## 2012-09-15 LAB — URINALYSIS, ROUTINE W REFLEX MICROSCOPIC
Bilirubin Urine: NEGATIVE
Glucose, UA: 250 mg/dL — AB
Hgb urine dipstick: NEGATIVE
Nitrite: NEGATIVE
Specific Gravity, Urine: 1.022 (ref 1.005–1.030)
pH: 6.5 (ref 5.0–8.0)

## 2012-09-15 LAB — COMPREHENSIVE METABOLIC PANEL
ALT: 14 U/L (ref 0–53)
AST: 25 U/L (ref 0–37)
Alkaline Phosphatase: 59 U/L (ref 39–117)
CO2: 22 mEq/L (ref 19–32)
Chloride: 103 mEq/L (ref 96–112)
GFR calc non Af Amer: 28 mL/min — ABNORMAL LOW (ref >90)
Potassium: 4.7 mEq/L (ref 3.5–5.1)
Sodium: 138 mEq/L (ref 135–145)
Total Bilirubin: 1.2 mg/dL (ref 0.3–1.2)

## 2012-09-15 LAB — CBC
Platelets: 130 10*3/uL — ABNORMAL LOW (ref 150–400)
RBC: 5.06 MIL/uL (ref 4.22–5.81)
WBC: 5 10*3/uL (ref 4.0–10.5)

## 2012-09-15 LAB — URINE MICROSCOPIC-ADD ON

## 2012-09-15 LAB — TYPE AND SCREEN

## 2012-09-15 LAB — POCT I-STAT, CHEM 8
Chloride: 109 mEq/L (ref 96–112)
Glucose, Bld: 150 mg/dL — ABNORMAL HIGH (ref 70–99)
HCT: 42 % (ref 39.0–52.0)
Hemoglobin: 14.3 g/dL (ref 13.0–17.0)
Potassium: 5.4 mEq/L — ABNORMAL HIGH (ref 3.5–5.1)
Sodium: 139 mEq/L (ref 135–145)

## 2012-09-15 LAB — SURGICAL PCR SCREEN: MRSA, PCR: NEGATIVE

## 2012-09-15 LAB — GLUCOSE, CAPILLARY: Glucose-Capillary: 100 mg/dL — ABNORMAL HIGH (ref 70–99)

## 2012-09-15 SURGERY — ANGIOGRAM, LOWER EXTREMITY
Anesthesia: LOCAL

## 2012-09-15 MED ORDER — LIDOCAINE HCL (PF) 1 % IJ SOLN
INTRAMUSCULAR | Status: AC
  Filled 2012-09-15: qty 30

## 2012-09-15 MED ORDER — FENTANYL CITRATE 0.05 MG/ML IJ SOLN
INTRAMUSCULAR | Status: AC
  Filled 2012-09-15: qty 2

## 2012-09-15 MED ORDER — MIDAZOLAM HCL 2 MG/2ML IJ SOLN
INTRAMUSCULAR | Status: AC
  Filled 2012-09-15: qty 2

## 2012-09-15 MED ORDER — SODIUM CHLORIDE 0.9 % IV SOLN: 1.0000 mL/kg/h | INTRAVENOUS | Status: DC

## 2012-09-15 MED ORDER — HEPARIN (PORCINE) IN NACL 2-0.9 UNIT/ML-% IJ SOLN
INTRAMUSCULAR | Status: AC
  Filled 2012-09-15: qty 1000

## 2012-09-15 NOTE — H&P (View-Only) (Signed)
Subjective:     Patient ID: [name], male   DOB: 03/24/1934, 78 y.o.   MRN: [id number]  HPI this 78-year-old male returns for continued followup regarding his left lower extremity bypass graft. This has been revised and replaced using vein from both lower extremities. He has known multiple areas of vein graft stenosis which are treated with angioplasty recently by Dr. Chien. Patient states over the past week he has only been able to walk about 10 yards without discomfort the left calf. He does not have rest pain. He also has severe fatigue and weakening of the left upper extremity secondary to left subclavian occlusion. He has a pacemaker. He has renal insufficiency with a creatinine of 2.68. He is not a candidate for an MRI because of pacemaker and he cannot get a CT angiogram because of the creatinine.  Past Medical History  Diagnosis Date  . Hyperlipidemia   . Gout   . Benign prostatic hypertrophy   . Atrial fibrillation     sees Dr. Henry Smith  . Chronic systolic dysfunction of left ventricle     a. mixed ischemic and nonischemic CM,  EF 35%. b. s/p AICD implantation.  . CHF (congestive heart failure)   . PAD (peripheral artery disease)     Severe by PV angiogram 09/2011  . ED (erectile dysfunction)   . Renal artery stenosis     s/p stenting 2009  . Arthritis   . Pacemaker     medtronic  . CAD (coronary artery disease)     a. s/p mid LAD stenting with DES 2008 Dr. Henry Smith  . ICD (implantable cardiac defibrillator) in place     medtronic, Dr. Allred  . ICD (implantable cardiac defibrillator) in place     due for check in Feb/2013  . Sleep apnea     hx of "had surgery for"  . Hypertension     sees Dr. S. Fry  . Type II diabetes mellitus     History  Substance Use Topics  . Smoking status: Never Smoker   . Smokeless tobacco: Never Used     Comment: 1-2 cigars when golfing  . Alcohol Use: 2.4 oz/week    2 Glasses of wine, 2 Shots of liquor per week     Comment:  07/10/2012 "galss of wine or vodka tonic 3 X/wk"    Family History  Problem Relation Age of Onset  . Cancer      breast/fhx  . Heart disease      fhx  . Diabetes Neg Hx   . Cancer Father     Allergies  Allergen Reactions  . Other Other (See Comments)    Plastic tape tears skin    Current outpatient prescriptions:carvedilol (COREG) 6.25 MG tablet, Take 1 tablet (6.25 mg total) by mouth 2 (two) times daily with a meal., Disp: 180 tablet, Rfl: 3;  finasteride (PROSCAR) 5 MG tablet, Take 1 tablet (5 mg total) by mouth daily., Disp: 90 tablet, Rfl: 3;  furosemide (LASIX) 40 MG tablet, Take 1 tablet (40 mg total) by mouth daily., Disp: 90 tablet, Rfl: 3 gabapentin (NEURONTIN) 300 MG capsule, Take 1 capsule (300 mg total) by mouth 3 (three) times daily., Disp: 270 capsule, Rfl: 3;  insulin aspart protamine-insulin aspart (NOVOLOG 70/30) (70-30) 100 UNIT/ML injection, Inject 10 Units into the skin 2 (two) times daily with a meal. 10 units with breakfast, and 10 units with the evening meal, Disp: , Rfl:  losartan (COZAAR) 100 MG   tablet, Take 1 tablet (100 mg total) by mouth daily., Disp: 90 tablet, Rfl: 3;  NITROSTAT 0.4 MG SL tablet, Place 0.4 mg under the tongue every 5 (five) minutes as needed for chest pain. For chest pain, max 3 doses, Disp: , Rfl: ;  warfarin (COUMADIN) 5 MG tablet, Take 2.5mg daily except none on Thrusday, Disp: 90 tablet, Rfl: 0  BP 107/60  Pulse 75  Ht 5' 8" (1.727 m)  Wt 188 lb (85.276 kg)  BMI 28.59 kg/m2  SpO2 100%  Body mass index is 28.59 kg/(m^2).          Review of Systems denies chest pain, dyspnea on exertion, PND, orthopnea. He does complain his left arm weakness and left leg claudication    Objective:   Physical Exam blood pressure 107/60 heart rate 75 respirations 16 General well-developed well-nourished male in no apparent stress alert and oriented x3 Lungs no rhonchi or wheezing Left upper extremity with absent pulses Left leg with a 2+  pulse in the subcutaneous femoral anterior tibial graft. The vein becomes quite small below the knee. No palpable dorsalis pedis pulse. Distal flow is somewhat muffled.   Today I ordered a duplex scan of the left femoral anterior tibial graft. There is recurrent stenosis in the proximal portion of the vein graft and the vein becomes quite small distally as none also there is a focal severe stenosis proximal to the distal anastomosis the anterior tibial artery.  Also ordered vein mapping today in the left upper extremity. Cephalic vein is small and not adequate. The basilic vein is adequate from the antecubital area to the axilla. 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 We need to evaluate his left subclavian artery as well but it is of secondary importance.    Plan:     #1 we'll discontinue Coumadin after Friday's dose and plan angiography of left leg by Dr. Brabham on Tuesday, July 15. We'll post him for revision of left femoral anterior tibial graft on Wednesday using basilic vein graft left arm to replace the lower portion of the bypass. If feasible would also like a selective left subclavian angiogram with small amount of contrast at the same setting since patient will be off of his Coumadin Obviously there are issues here with renal insufficiency. Possibly can use CO2 for the lower extremity study with a very small amount of contrast and possibly could do one small injection the left subclavian artery since patient is not candidate for CT angiogram or MRA      

## 2012-09-15 NOTE — Op Note (Signed)
[facility]  Patient name: [name] MRN: [id number] DOB: [birth year] Sex: male  09/15/2012 Pre-operative Diagnosis: Left leg bypass graft stenosis Post-operative diagnosis:  Same Surgeon:  Jorge Ny Procedure Performed:  1.  ultrasound-guided access, right femoral artery  2.  abdominal aortogram with CO2  3.  second order catheterization (left external iliac)  4.  left lower extremity runoff  5.  first order catheterization (left subclavian artery)  6.  left upper extremity runoff    Indications:  The patient has a previous femoral anterior tibial bypass graft with vein. Ultrasound identified distal stenosis. The patient is very symptomatic. In addition he complains of left arm claudication. He comes in for further evaluation and possible intervention.  Procedure:  The patient was identified in the holding area and taken to room 8.  The patient was then placed supine on the table and prepped and draped in the usual sterile fashion.  A time out was called.  Ultrasound was used to evaluate the right common femoral artery.  It was patent .  A digital ultrasound image was acquired.  A micropuncture needle was used to access the right common femoral artery under ultrasound guidance.  An 018 wire was advanced without resistance and a micropuncture sheath was placed.  The 018 wire was removed and a benson wire was placed.  The micropuncture sheath was exchanged for a 5 french sheath.  An omniflush catheter was advanced over the wire to the level of L-1.  An abdominal angiogram was obtained.  Next, using the omniflush catheter and a benson wire, the aortic bifurcation was crossed and the catheter was placed into theleft external iliac artery and left runoff was obtained.    Findings:   Aortogram:  The visualized portions of the suprarenal abdominal aorta showed no significant stenosis. A stent is visualized within the right renal artery. This is not well  visualized with CO2. The left renal artery is widely patent. The infrarenal abdominal aorta is widely patent. Bilateral common and external iliac arteries are widely patent.  Right Lower Extremity:  Not visualized today  Left Lower Extremity:  The left common femoral and profunda femoral arteries are widely patent. The superficial femoral artery is occluded. There bypass graft is occluded. There is reconstitution of a very small low knee popliteal artery with single-vessel runoff via the anterior tibial artery. There is a focal disease within the proximal anterior tibial artery.   Left subclavian artery: The proximal portion of the left subclavian artery is patent. The vertebral artery is patent. Just after the vertebral artery origin the subclavian artery occludes for a short distance. There is reconstitution of the axillary artery.  Intervention:  None  Impression:  #1  occluded left femoral to anterior tibial bypass graft.  #2  there is reconstitution of the below-knee popliteal artery with single-vessel runoff via the anterior tibial artery which is diseased proximally.  #3  the proximal subclavian artery is patent. The vertebral artery is patent, however the subclavian artery occludes just beyond the vertebral artery. There is reconstitution of the axillary artery.    Juleen China, M.D. [facility] Office: [phone] Pager:  [phone]

## 2012-09-15 NOTE — Progress Notes (Signed)
Urine culture and PCR pending DOS. Made an attempt to contact Dr. Chaney Malling ( anesthesia) to inform him of abnormal lab values (creatinine 2.13 and PT 16.7) however, phone # [phone] was busy and I received no return call from pager # [phone]. Please consult anesthesia on DOS regarding abnormal labs listed above.

## 2012-09-15 NOTE — Progress Notes (Signed)
[employer] called re: surgery date and time. Device program order faxed to Dr Johney Frame @ device clinic, [facility] cardiology.

## 2012-09-15 NOTE — Pre-Procedure Instructions (Signed)
CULLEY HEDEEN  09/15/2012   Your procedure is scheduled on:  Wednesday, July 16  Report to [facility] at 0730 AM.  Call this number if you have problems the morning of surgery: [phone]   Remember:   Do not eat food or drink liquids after midnight.tonight   Take these medicines the morning of surgery with A SIP OF WATER: Carvedilol,Gabapentin,Hydrocodone, if needed   Do not wear jewelry, make-up or nail polish.  Do not wear lotions, powders, or perfumes. You may wear deodorant.  Do not shave 48 hours prior to surgery. Men may shave face and neck.  Do not bring valuables to the hospital.  [facility] is not responsible  for any belongings or valuables.  Contacts, dentures or bridgework may not be worn into surgery.  Leave suitcase in the car. After surgery it may be brought to your room.  For patients admitted to the hospital, checkout time is 11:00 AM the day of  discharge.      Special Instructions: Shower using CHG 2 nights before surgery and the night before surgery.  If you shower the day of surgery use CHG.  Use special wash - you have one bottle of CHG for all showers.  You should use approximately 1/3 of the bottle for each shower.   Please read over the following fact sheets that you were given: Pain Booklet, Coughing and Deep Breathing, Blood Transfusion Information and Surgical Site Infection Prevention

## 2012-09-15 NOTE — Interval H&P Note (Signed)
History and Physical Interval Note:  09/15/2012 1:00 PM  [name]  has presented today for surgery, with the diagnosis of PVD  The various methods of treatment have been discussed with the patient and family. After consideration of risks, benefits and other options for treatment, the patient has consented to  Procedure(s): LOWER EXTREMITY ANGIOGRAM (N/A) as a surgical intervention .  The patient's history has been reviewed, patient examined, no change in status, stable for surgery.  I have reviewed the patient's chart and labs.  Questions were answered to the patient's satisfaction.     BRABHAM IV, V. WELLS

## 2012-09-16 ENCOUNTER — Inpatient Hospital Stay (HOSPITAL_COMMUNITY)
Admission: RE | Admit: 2012-09-16 | Discharge: 2012-09-18 | DRG: 300 | Disposition: A | Discharge status: Home / Self Care | Payer: Medicare Other | Source: Ambulatory Visit | Attending: Vascular Surgery | Admitting: Vascular Surgery

## 2012-09-16 ENCOUNTER — Encounter (HOSPITAL_COMMUNITY)
Admission: RE | Disposition: A | Discharge status: Home / Self Care | Payer: Self-pay | Source: Ambulatory Visit | Attending: Vascular Surgery

## 2012-09-16 ENCOUNTER — Encounter (HOSPITAL_COMMUNITY): Payer: Self-pay | Admitting: Anesthesiology

## 2012-09-16 ENCOUNTER — Ambulatory Visit (HOSPITAL_COMMUNITY): Payer: Medicare Other | Admitting: Anesthesiology

## 2012-09-16 ENCOUNTER — Ambulatory Visit (HOSPITAL_COMMUNITY): Payer: Medicare Other

## 2012-09-16 ENCOUNTER — Encounter (HOSPITAL_COMMUNITY): Payer: Self-pay | Admitting: *Deleted

## 2012-09-16 DIAGNOSIS — E785 Hyperlipidemia, unspecified: Secondary | ICD-10-CM | POA: Diagnosis present

## 2012-09-16 DIAGNOSIS — Z79899 Other long term (current) drug therapy: Secondary | ICD-10-CM

## 2012-09-16 DIAGNOSIS — N529 Male erectile dysfunction, unspecified: Secondary | ICD-10-CM | POA: Diagnosis present

## 2012-09-16 DIAGNOSIS — L259 Unspecified contact dermatitis, unspecified cause: Secondary | ICD-10-CM | POA: Diagnosis present

## 2012-09-16 DIAGNOSIS — I2589 Other forms of chronic ischemic heart disease: Secondary | ICD-10-CM | POA: Diagnosis present

## 2012-09-16 DIAGNOSIS — I4891 Unspecified atrial fibrillation: Secondary | ICD-10-CM | POA: Diagnosis present

## 2012-09-16 DIAGNOSIS — Z794 Long term (current) use of insulin: Secondary | ICD-10-CM

## 2012-09-16 DIAGNOSIS — I70219 Atherosclerosis of native arteries of extremities with intermittent claudication, unspecified extremity: Secondary | ICD-10-CM | POA: Diagnosis present

## 2012-09-16 DIAGNOSIS — I7092 Chronic total occlusion of artery of the extremities: Secondary | ICD-10-CM | POA: Diagnosis present

## 2012-09-16 DIAGNOSIS — I509 Heart failure, unspecified: Secondary | ICD-10-CM | POA: Diagnosis present

## 2012-09-16 DIAGNOSIS — G609 Hereditary and idiopathic neuropathy, unspecified: Secondary | ICD-10-CM | POA: Diagnosis present

## 2012-09-16 DIAGNOSIS — I5022 Chronic systolic (congestive) heart failure: Secondary | ICD-10-CM | POA: Diagnosis present

## 2012-09-16 DIAGNOSIS — Z01812 Encounter for preprocedural laboratory examination: Secondary | ICD-10-CM

## 2012-09-16 DIAGNOSIS — Z7901 Long term (current) use of anticoagulants: Secondary | ICD-10-CM

## 2012-09-16 DIAGNOSIS — I70409 Unspecified atherosclerosis of autologous vein bypass graft(s) of the extremities, unspecified extremity: Principal | ICD-10-CM | POA: Diagnosis present

## 2012-09-16 DIAGNOSIS — I739 Peripheral vascular disease, unspecified: Secondary | ICD-10-CM

## 2012-09-16 DIAGNOSIS — Z8249 Family history of ischemic heart disease and other diseases of the circulatory system: Secondary | ICD-10-CM

## 2012-09-16 DIAGNOSIS — E119 Type 2 diabetes mellitus without complications: Secondary | ICD-10-CM | POA: Diagnosis present

## 2012-09-16 DIAGNOSIS — N400 Enlarged prostate without lower urinary tract symptoms: Secondary | ICD-10-CM | POA: Diagnosis present

## 2012-09-16 DIAGNOSIS — N183 Chronic kidney disease, stage 3 unspecified: Secondary | ICD-10-CM | POA: Diagnosis present

## 2012-09-16 DIAGNOSIS — M109 Gout, unspecified: Secondary | ICD-10-CM | POA: Diagnosis present

## 2012-09-16 DIAGNOSIS — I708 Atherosclerosis of other arteries: Secondary | ICD-10-CM | POA: Diagnosis present

## 2012-09-16 DIAGNOSIS — Z95810 Presence of automatic (implantable) cardiac defibrillator: Secondary | ICD-10-CM

## 2012-09-16 HISTORY — PX: FEMORAL-POPLITEAL BYPASS GRAFT: SHX937

## 2012-09-16 LAB — GLUCOSE, CAPILLARY: Glucose-Capillary: 97 mg/dL (ref 70–99)

## 2012-09-16 SURGERY — BYPASS GRAFT FEMORAL-POPLITEAL ARTERY
Anesthesia: General | Site: Leg Upper | Laterality: Left | Wound class: Clean

## 2012-09-16 MED ORDER — PHENOL 1.4 % MT LIQD: 1.0000 | OROMUCOSAL | Status: DC | PRN

## 2012-09-16 MED ORDER — POTASSIUM CHLORIDE CRYS ER 20 MEQ PO TBCR: 20.0000 meq | EXTENDED_RELEASE_TABLET | Freq: Once | ORAL | Status: AC | PRN

## 2012-09-16 MED ORDER — LABETALOL HCL 5 MG/ML IV SOLN
10.0000 mg | INTRAVENOUS | Status: DC | PRN
  Filled 2012-09-16: qty 4

## 2012-09-16 MED ORDER — IOHEXOL 300 MG/ML  SOLN
INTRAMUSCULAR | Status: DC | PRN
  Administered 2012-09-16: 50 mL via INTRAVENOUS

## 2012-09-16 MED ORDER — WARFARIN SODIUM 10 MG PO TABS
10.0000 mg | ORAL_TABLET | Freq: Once | ORAL | Status: AC
  Administered 2012-09-16: 10 mg via ORAL
  Filled 2012-09-16: qty 1

## 2012-09-16 MED ORDER — FUROSEMIDE 40 MG PO TABS
40.0000 mg | ORAL_TABLET | Freq: Every day | ORAL | Status: DC
  Administered 2012-09-17: 40 mg via ORAL
  Filled 2012-09-16 (×3): qty 1

## 2012-09-16 MED ORDER — WARFARIN - PHARMACIST DOSING INPATIENT: Freq: Every day | Status: DC

## 2012-09-16 MED ORDER — METOCLOPRAMIDE HCL 5 MG/ML IJ SOLN: 10.0000 mg | Freq: Once | INTRAMUSCULAR | Status: DC | PRN

## 2012-09-16 MED ORDER — LIDOCAINE HCL (CARDIAC) 20 MG/ML IV SOLN
INTRAVENOUS | Status: DC | PRN
  Administered 2012-09-16: 100 mg via INTRAVENOUS

## 2012-09-16 MED ORDER — CARVEDILOL 6.25 MG PO TABS
6.2500 mg | ORAL_TABLET | Freq: Two times a day (BID) | ORAL | Status: DC
  Administered 2012-09-17 (×2): 6.25 mg via ORAL
  Filled 2012-09-16 (×5): qty 1

## 2012-09-16 MED ORDER — ONDANSETRON HCL 4 MG/2ML IJ SOLN: 4.0000 mg | Freq: Four times a day (QID) | INTRAMUSCULAR | Status: DC | PRN

## 2012-09-16 MED ORDER — OXYCODONE-ACETAMINOPHEN 5-325 MG PO TABS
1.0000 | ORAL_TABLET | ORAL | Status: DC | PRN
  Administered 2012-09-16: 2 via ORAL
  Administered 2012-09-16: 1 via ORAL
  Administered 2012-09-17 (×2): 2 via ORAL
  Administered 2012-09-17: 1 via ORAL
  Filled 2012-09-16 (×2): qty 2
  Filled 2012-09-16 (×2): qty 1

## 2012-09-16 MED ORDER — PHENYLEPHRINE HCL 10 MG/ML IJ SOLN
10.0000 mg | INTRAVENOUS | Status: DC | PRN
  Administered 2012-09-16: 20 ug/min via INTRAVENOUS

## 2012-09-16 MED ORDER — ACETAMINOPHEN 325 MG PO TABS: 325.0000 mg | ORAL_TABLET | ORAL | Status: DC | PRN

## 2012-09-16 MED ORDER — HEPARIN (PORCINE) IN NACL 100-0.45 UNIT/ML-% IJ SOLN
500.0000 [IU]/h | INTRAMUSCULAR | Status: DC
Start: 2012-09-16 — End: 2012-09-18
  Administered 2012-09-16: 500 [IU]/h via INTRAVENOUS
  Filled 2012-09-16 (×3): qty 250

## 2012-09-16 MED ORDER — MAGNESIUM SULFATE 40 MG/ML IJ SOLN
2.0000 g | Freq: Once | INTRAMUSCULAR | Status: AC | PRN
  Filled 2012-09-16: qty 50

## 2012-09-16 MED ORDER — DEXTROSE 5 % IV SOLN
1.5000 g | Freq: Two times a day (BID) | INTRAVENOUS | Status: AC
  Administered 2012-09-16 – 2012-09-17 (×2): 1.5 g via INTRAVENOUS
  Filled 2012-09-16 (×2): qty 1.5

## 2012-09-16 MED ORDER — WARFARIN - PHYSICIAN DOSING INPATIENT: Freq: Every day | Status: DC

## 2012-09-16 MED ORDER — PROPOFOL 10 MG/ML IV BOLUS
INTRAVENOUS | Status: DC | PRN
  Administered 2012-09-16 (×2): 30 mg via INTRAVENOUS
  Administered 2012-09-16: 180 mg via INTRAVENOUS

## 2012-09-16 MED ORDER — METOPROLOL TARTRATE 1 MG/ML IV SOLN: 2.0000 mg | INTRAVENOUS | Status: DC | PRN

## 2012-09-16 MED ORDER — DOPAMINE-DEXTROSE 3.2-5 MG/ML-% IV SOLN: 3.0000 ug/kg/min | INTRAVENOUS | Status: DC

## 2012-09-16 MED ORDER — WARFARIN SODIUM 10 MG PO TABS: 10.0000 mg | ORAL_TABLET | Freq: Once | ORAL | Status: DC

## 2012-09-16 MED ORDER — HEPARIN SODIUM (PORCINE) 1000 UNIT/ML IJ SOLN
INTRAMUSCULAR | Status: DC | PRN
  Administered 2012-09-16: 6000 [IU] via INTRAVENOUS

## 2012-09-16 MED ORDER — FENTANYL CITRATE 0.05 MG/ML IJ SOLN
INTRAMUSCULAR | Status: DC | PRN
  Administered 2012-09-16 (×2): 50 ug via INTRAVENOUS
  Administered 2012-09-16: 125 ug via INTRAVENOUS
  Administered 2012-09-16: 75 ug via INTRAVENOUS

## 2012-09-16 MED ORDER — SODIUM CHLORIDE 0.9 % IV SOLN: 500.0000 mL | Freq: Once | INTRAVENOUS | Status: AC | PRN

## 2012-09-16 MED ORDER — DOCUSATE SODIUM 100 MG PO CAPS
100.0000 mg | ORAL_CAPSULE | Freq: Every day | ORAL | Status: DC
  Administered 2012-09-17: 100 mg via ORAL
  Filled 2012-09-16 (×2): qty 1

## 2012-09-16 MED ORDER — HYDROMORPHONE HCL PF 1 MG/ML IJ SOLN
0.2500 mg | INTRAMUSCULAR | Status: DC | PRN
  Administered 2012-09-16 (×2): 0.5 mg via INTRAVENOUS

## 2012-09-16 MED ORDER — GABAPENTIN 300 MG PO CAPS
300.0000 mg | ORAL_CAPSULE | Freq: Three times a day (TID) | ORAL | Status: DC
  Administered 2012-09-16 – 2012-09-17 (×4): 300 mg via ORAL
  Filled 2012-09-16 (×7): qty 1

## 2012-09-16 MED ORDER — MUPIROCIN 2 % EX OINT
TOPICAL_OINTMENT | Freq: Two times a day (BID) | CUTANEOUS | Status: DC
  Administered 2012-09-16 – 2012-09-17 (×3): via NASAL
  Filled 2012-09-16 (×2): qty 22

## 2012-09-16 MED ORDER — MUPIROCIN 2 % EX OINT
TOPICAL_OINTMENT | CUTANEOUS | Status: AC
  Administered 2012-09-16: 1 via NASAL
  Filled 2012-09-16: qty 22

## 2012-09-16 MED ORDER — GUAIFENESIN-DM 100-10 MG/5ML PO SYRP: 15.0000 mL | ORAL_SOLUTION | ORAL | Status: DC | PRN

## 2012-09-16 MED ORDER — PANTOPRAZOLE SODIUM 40 MG PO TBEC
40.0000 mg | DELAYED_RELEASE_TABLET | Freq: Every day | ORAL | Status: DC
  Administered 2012-09-17: 40 mg via ORAL
  Filled 2012-09-16: qty 1

## 2012-09-16 MED ORDER — HYDROMORPHONE HCL PF 1 MG/ML IJ SOLN
INTRAMUSCULAR | Status: AC
  Filled 2012-09-16: qty 1

## 2012-09-16 MED ORDER — OXYCODONE-ACETAMINOPHEN 5-325 MG PO TABS
ORAL_TABLET | ORAL | Status: AC
  Filled 2012-09-16: qty 2

## 2012-09-16 MED ORDER — GLYCOPYRROLATE 0.2 MG/ML IJ SOLN
INTRAMUSCULAR | Status: DC | PRN
  Administered 2012-09-16: 0.4 mg via INTRAVENOUS

## 2012-09-16 MED ORDER — SODIUM CHLORIDE 0.9 % IR SOLN
Status: DC | PRN
  Administered 2012-09-16: 12:00:00

## 2012-09-16 MED ORDER — LOSARTAN POTASSIUM 50 MG PO TABS
100.0000 mg | ORAL_TABLET | Freq: Every day | ORAL | Status: DC
  Administered 2012-09-16 – 2012-09-17 (×2): 100 mg via ORAL
  Filled 2012-09-16 (×3): qty 2

## 2012-09-16 MED ORDER — FINASTERIDE 5 MG PO TABS
5.0000 mg | ORAL_TABLET | Freq: Every day | ORAL | Status: DC
  Administered 2012-09-16 – 2012-09-17 (×2): 5 mg via ORAL
  Filled 2012-09-16 (×3): qty 1

## 2012-09-16 MED ORDER — CEFAZOLIN SODIUM 1-5 GM-% IV SOLN
INTRAVENOUS | Status: AC
  Filled 2012-09-16: qty 50

## 2012-09-16 MED ORDER — ALUM & MAG HYDROXIDE-SIMETH 200-200-20 MG/5ML PO SUSP: 15.0000 mL | ORAL | Status: DC | PRN

## 2012-09-16 MED ORDER — OXYCODONE HCL 5 MG/5ML PO SOLN: 5.0000 mg | Freq: Once | ORAL | Status: DC | PRN

## 2012-09-16 MED ORDER — SODIUM CHLORIDE 0.9 % IV SOLN
INTRAVENOUS | Status: DC
  Administered 2012-09-16: 20:00:00 via INTRAVENOUS

## 2012-09-16 MED ORDER — OXYCODONE HCL 5 MG PO TABS: 5.0000 mg | ORAL_TABLET | Freq: Once | ORAL | Status: DC | PRN

## 2012-09-16 MED ORDER — 0.9 % SODIUM CHLORIDE (POUR BTL) OPTIME
TOPICAL | Status: DC | PRN
  Administered 2012-09-16: 2000 mL

## 2012-09-16 MED ORDER — MORPHINE SULFATE 2 MG/ML IJ SOLN
2.0000 mg | INTRAMUSCULAR | Status: DC | PRN
  Administered 2012-09-16: 2 mg via INTRAVENOUS
  Filled 2012-09-16: qty 1

## 2012-09-16 MED ORDER — ROCURONIUM BROMIDE 100 MG/10ML IV SOLN
INTRAVENOUS | Status: DC | PRN
  Administered 2012-09-16: 50 mg via INTRAVENOUS

## 2012-09-16 MED ORDER — HYDRALAZINE HCL 20 MG/ML IJ SOLN: 10.0000 mg | INTRAMUSCULAR | Status: DC | PRN

## 2012-09-16 MED ORDER — NEOSTIGMINE METHYLSULFATE 1 MG/ML IJ SOLN
INTRAMUSCULAR | Status: DC | PRN
  Administered 2012-09-16: 3 mg via INTRAVENOUS

## 2012-09-16 MED ORDER — SODIUM CHLORIDE 0.9 % IV SOLN
INTRAVENOUS | Status: DC
  Administered 2012-09-16 (×2): via INTRAVENOUS

## 2012-09-16 MED ORDER — ONDANSETRON HCL 4 MG/2ML IJ SOLN
INTRAMUSCULAR | Status: DC | PRN
  Administered 2012-09-16: 4 mg via INTRAVENOUS

## 2012-09-16 MED ORDER — NITROGLYCERIN 0.4 MG SL SUBL: 0.4000 mg | SUBLINGUAL_TABLET | SUBLINGUAL | Status: DC | PRN

## 2012-09-16 MED ORDER — ACETAMINOPHEN 650 MG RE SUPP: 325.0000 mg | RECTAL | Status: DC | PRN

## 2012-09-16 MED ORDER — INSULIN ASPART PROT & ASPART (70-30 MIX) 100 UNIT/ML ~~LOC~~ SUSP
10.0000 [IU] | Freq: Two times a day (BID) | SUBCUTANEOUS | Status: DC
  Administered 2012-09-17 (×2): 10 [IU] via SUBCUTANEOUS
  Filled 2012-09-16: qty 10

## 2012-09-16 MED ORDER — CEFAZOLIN SODIUM 1-5 GM-% IV SOLN
INTRAVENOUS | Status: DC | PRN
  Administered 2012-09-16: 1 g via INTRAVENOUS

## 2012-09-16 SURGICAL SUPPLY — 60 items
ADH SKN CLS APL DERMABOND .7 (GAUZE/BANDAGES/DRESSINGS) ×1
ADH SKN CLS LQ APL DERMABOND (GAUZE/BANDAGES/DRESSINGS) ×3
BANDAGE ESMARK 6X9 LF (GAUZE/BANDAGES/DRESSINGS) IMPLANT
BNDG CMPR 9X6 STRL LF SNTH (GAUZE/BANDAGES/DRESSINGS)
BNDG ESMARK 6X9 LF (GAUZE/BANDAGES/DRESSINGS)
BOOT SUTURE AID YELLOW STND (SUTURE) IMPLANT
CANISTER SUCTION 2500CC (MISCELLANEOUS) ×2 IMPLANT
CLIP TI MEDIUM 24 (CLIP) ×2 IMPLANT
CLIP TI WIDE RED SMALL 24 (CLIP) ×2 IMPLANT
CLOTH BEACON ORANGE TIMEOUT ST (SAFETY) ×2 IMPLANT
COVER SURGICAL LIGHT HANDLE (MISCELLANEOUS) ×2 IMPLANT
DECANTER SPIKE VIAL GLASS SM (MISCELLANEOUS) IMPLANT
DERMABOND ADHESIVE PROPEN (GAUZE/BANDAGES/DRESSINGS) ×3
DERMABOND ADVANCED (GAUZE/BANDAGES/DRESSINGS) ×1
DERMABOND ADVANCED .7 DNX12 (GAUZE/BANDAGES/DRESSINGS) ×1 IMPLANT
DERMABOND ADVANCED .7 DNX6 (GAUZE/BANDAGES/DRESSINGS) IMPLANT
DRAIN SNY 10X20 3/4 PERF (WOUND CARE) IMPLANT
DRAPE WARM FLUID 44X44 (DRAPE) ×2 IMPLANT
DRAPE X-RAY CASS 24X20 (DRAPES) ×1 IMPLANT
ELECT REM PT RETURN 9FT ADLT (ELECTROSURGICAL) ×2
ELECTRODE REM PT RTRN 9FT ADLT (ELECTROSURGICAL) ×1 IMPLANT
EVACUATOR SILICONE 100CC (DRAIN) IMPLANT
GLOVE BIOGEL PI IND STRL 6.5 (GLOVE) IMPLANT
GLOVE BIOGEL PI IND STRL 7.5 (GLOVE) IMPLANT
GLOVE BIOGEL PI INDICATOR 6.5 (GLOVE) ×3
GLOVE BIOGEL PI INDICATOR 7.5 (GLOVE) ×1
GLOVE SS BIOGEL STRL SZ 7 (GLOVE) ×1 IMPLANT
GLOVE SS BIOGEL STRL SZ 7.5 (GLOVE) IMPLANT
GLOVE SUPERSENSE BIOGEL SZ 7 (GLOVE) ×1
GLOVE SUPERSENSE BIOGEL SZ 7.5 (GLOVE) ×1
GLOVE SURG SS PI 7.0 STRL IVOR (GLOVE) ×1 IMPLANT
GLOVE SURG SS PI 7.5 STRL IVOR (GLOVE) ×2 IMPLANT
GOWN STRL NON-REIN LRG LVL3 (GOWN DISPOSABLE) ×6 IMPLANT
GOWN STRL REIN XL XLG (GOWN DISPOSABLE) ×1 IMPLANT
GRAFT PROPATEN THIN WALL 6X80 (Vascular Products) ×1 IMPLANT
INSERT FOGARTY SM (MISCELLANEOUS) ×2 IMPLANT
KIT BASIN OR (CUSTOM PROCEDURE TRAY) ×2 IMPLANT
KIT ROOM TURNOVER OR (KITS) ×2 IMPLANT
NS IRRIG 1000ML POUR BTL (IV SOLUTION) ×4 IMPLANT
PACK PERIPHERAL VASCULAR (CUSTOM PROCEDURE TRAY) ×2 IMPLANT
PAD ARMBOARD 7.5X6 YLW CONV (MISCELLANEOUS) ×4 IMPLANT
PADDING CAST COTTON 6X4 STRL (CAST SUPPLIES) IMPLANT
SET COLLECT BLD 21X3/4 12 (NEEDLE) ×1 IMPLANT
STOPCOCK 4 WAY LG BORE MALE ST (IV SETS) ×1 IMPLANT
SUT PROLENE 6 0 BV (SUTURE) ×1 IMPLANT
SUT PROLENE 6 0 CC (SUTURE) ×4 IMPLANT
SUT PROLENE 7 0 BV 1 (SUTURE) IMPLANT
SUT PROLENE 7 0 BV1 MDA (SUTURE) IMPLANT
SUT SILK 2 0 SH (SUTURE) ×2 IMPLANT
SUT SILK 3 0 (SUTURE)
SUT SILK 3-0 18XBRD TIE 12 (SUTURE) IMPLANT
SUT VIC AB 2-0 CTX 36 (SUTURE) ×4 IMPLANT
SUT VIC AB 3-0 SH 27 (SUTURE) ×4
SUT VIC AB 3-0 SH 27X BRD (SUTURE) ×2 IMPLANT
TOWEL OR 17X24 6PK STRL BLUE (TOWEL DISPOSABLE) ×4 IMPLANT
TOWEL OR 17X26 10 PK STRL BLUE (TOWEL DISPOSABLE) ×4 IMPLANT
TRAY FOLEY CATH 14FRSI W/METER (CATHETERS) ×2 IMPLANT
TUBING EXTENTION W/L.L. (IV SETS) ×1 IMPLANT
UNDERPAD 30X30 INCONTINENT (UNDERPADS AND DIAPERS) ×2 IMPLANT
WATER STERILE IRR 1000ML POUR (IV SOLUTION) ×2 IMPLANT

## 2012-09-16 NOTE — Anesthesia Postprocedure Evaluation (Signed)
Anesthesia Post Note  Patient: [name]  Procedure(s) Performed: Procedure(s) (LRB): LEFT FEMORAL-POPLITEAL BYPASS GRAFT WITH GORTEX Propaten Graft 6x80 Thin Wall and Left lower leg Angiogram (Left)  Anesthesia type: General  Patient location: PACU  Post pain: Pain level controlled  Post assessment: Patient's Cardiovascular Status Stable  Last Vitals:  Filed Vitals:   09/16/12 1545  BP:   Pulse: 70  Temp:   Resp: 14    Post vital signs: Reviewed and stable  Level of consciousness: alert  Complications: No apparent anesthesia complications

## 2012-09-16 NOTE — Op Note (Signed)
OPERATIVE REPORT  Date of Surgery: 09/16/2012  Surgeon: [name], MD  Assistant: Lianne Cure PA  Pre-op Diagnosis: Ischemic left foot secondary to severe superficial femoral popliteal and tibial occlusive disease with occlusion of previous femoral anterior tibial vein graft  Post-op Diagnosis: Same  Procedure: Procedure(s): LEFT FEMORAL-POPLITEAL BYPASS GRAFT WITH GORTEX Propaten Graft 6x80 Thin Wall and Left lower leg Angiogram  Anesthesia: General  EBL: 100 cc  Complications: None  Patient was taken to the operating room placed in supine position at which time satisfactory general endotracheal anesthesia was administered. The left leg was prepped with Betadine scrub and solution draped in a routine sterile manner. Incision was made to the previous scar in the left inguinal area the common superficial and profunda femoris arteries were dissected free as was the origin of the occluded femoral-popliteal vein graft. There was a good pulse in the common femoral artery although it was thickened. Medial incision was made below the knee popliteal space entered. The popliteal artery was exposed and circled with Vesseloops. He was diffusely diseased vessel as  Known but was patent on angiogram. Patient was known to have severe disease in all tibial vessels with one-vessel runoff the anterior tibial artery. Patient had no remaining vein in the upper lower extremities it was decided to attempt a Gore-Tex graft and at the below knee popliteal artery with limited other options. A subfascial tunnel was created between the 2 incisions the patient was heparinized. Popliteal arteries are occluded proximally and distally a 15 blade extended with Potts scissors. He did have a patent lumen which was about 2-1/2-3 mm in size it was diffusely diseased. There was backbleeding present. Gore-Tex spatulated and anastomosed end to side with 6-0 Prolene. Attention turned to the inguinal area when he external iliac  was occluded anything ligament superficial femoral and profunda were occluded distally. Longitudinal opening made in the common femoral artery a 15 blade extended with Potts scissors. There was excellent inflow present. Gore-Tex was spatulated and anastomosed end to side with 6-0 Prolene. Clamps were then released and there was audible Doppler flow in the anterior tibial artery at the ankle although it was monophasic. There was no Doppler flow without the bypass. Intraoperative arteriogram revealed a widely patent anastomosis into a severely diseased popliteal artery with one-vessel runoff through a severely diseased anterior tibial artery. No protamine was given was irrigated with saline closed in layers of Vicryl subcuticular fashion sterile dressing applied patient taken to recovery in stable condition Procedure Details:   [name], MD 09/16/2012 1:42 PM

## 2012-09-16 NOTE — Interval H&P Note (Signed)
History and Physical Interval Note:  09/16/2012 10:45 AM  [name]  has presented today for surgery, with the diagnosis of Left Femoral- Anterior Tibial Vein Graft Stenosis  The various methods of treatment have been discussed with the patient and family. After consideration of risks, benefits and other options for treatment, the patient has consented to  Procedure(s): LEFT FEMORAL-POPLITEAL BYPASS GRAFT WITH GORTEX  (Left) as a surgical intervention .  The patient's history has been reviewed, patient examined, no change in status, stable for surgery.  I have reviewed the patient's chart and labs.  Questions were answered to the patient's satisfaction.     [name]

## 2012-09-16 NOTE — Anesthesia Preprocedure Evaluation (Addendum)
Anesthesia Evaluation  Patient identified by MRN, date of birth, ID band Patient awake    Reviewed: Allergy & Precautions, H&P , NPO status , Patient's Chart, lab work & pertinent test results, reviewed documented beta blocker date and time   History of Anesthesia Complications Negative for: history of anesthetic complications  Airway Mallampati: II TM Distance: >3 FB Neck ROM: full    Dental  (+) Teeth Intact and Dental Advisory Given   Pulmonary sleep apnea ,  breath sounds clear to auscultation        Cardiovascular hypertension, Pt. on medications and Pt. on home beta blockers + CAD, + Cardiac Stents, + Peripheral Vascular Disease and +CHF + dysrhythmias Atrial Fibrillation + pacemaker + Cardiac Defibrillator Rhythm:regular     Neuro/Psych  Neuromuscular disease negative psych ROS   GI/Hepatic negative GI ROS, Neg liver ROS,   Endo/Other  negative endocrine ROSdiabetes, Poorly Controlled, Type 2, Insulin Dependent  Renal/GU Renal InsufficiencyRenal disease  negative genitourinary   Musculoskeletal   Abdominal   Peds  Hematology  (+) Blood dyscrasia, ,   Anesthesia Other Findings See surgeon's H&P   Reproductive/Obstetrics negative OB ROS                        Anesthesia Physical Anesthesia Plan  ASA: III  Anesthesia Plan: General   Post-op Pain Management:    Induction: Intravenous  Airway Management Planned: Oral ETT  Additional Equipment: Arterial line  Intra-op Plan:   Post-operative Plan: Extubation in OR  Informed Consent: I have reviewed the patients History and Physical, chart, labs and discussed the procedure including the risks, benefits and alternatives for the proposed anesthesia with the patient or authorized representative who has indicated his/her understanding and acceptance.   Dental Advisory Given  Plan Discussed with: CRNA and Surgeon  Anesthesia Plan  Comments:         Anesthesia Quick Evaluation

## 2012-09-16 NOTE — Transfer of Care (Signed)
Immediate Anesthesia Transfer of Care Note  Patient: [name]  Procedure(s) Performed: Procedure(s): LEFT FEMORAL-POPLITEAL BYPASS GRAFT WITH GORTEX Propaten Graft 6x80 Thin Wall and Left lower leg Angiogram (Left)  Patient Location: PACU  Anesthesia Type:General  Level of Consciousness: awake, alert , oriented and patient cooperative  Airway & Oxygen Therapy: Patient Spontanous Breathing and Patient connected to nasal cannula oxygen  Post-op Assessment: Report given to PACU RN and Post -op Vital signs reviewed and stable  Post vital signs: Reviewed and stable  Complications: No apparent anesthesia complications

## 2012-09-16 NOTE — Anesthesia Procedure Notes (Signed)
Procedure Name: Intubation Date/Time: 09/16/2012 11:32 AM Performed by: Lovie Chol Pre-anesthesia Checklist: Patient identified, Emergency Drugs available, Suction available and Patient being monitored Patient Re-evaluated:Patient Re-evaluated prior to inductionOxygen Delivery Method: Circle system utilized Preoxygenation: Pre-oxygenation with 100% oxygen Intubation Type: IV induction Ventilation: Mask ventilation without difficulty Laryngoscope Size: Miller and 2 Grade View: Grade I Tube type: Oral Tube size: 7.5 mm Number of attempts: 1 Airway Equipment and Method: Stylet Placement Confirmation: ETT inserted through vocal cords under direct vision,  positive ETCO2 and breath sounds checked- equal and bilateral Secured at: 22 cm Tube secured with: Tape Dental Injury: Teeth and Oropharynx as per pre-operative assessment

## 2012-09-16 NOTE — Preoperative (Signed)
Beta Blockers   Reason not to administer Beta Blockers:Not Applicable 

## 2012-09-16 NOTE — Progress Notes (Signed)
Rec'd report from [facility]

## 2012-09-16 NOTE — H&P (View-Only) (Signed)
Subjective:     Patient ID: [name], male   DOB: [birth year], 78 y.o.   MRN: [id number]  HPI this 78 year old male returns for continued followup regarding his left lower extremity bypass graft. This has been revised and replaced using vein from both lower extremities. He has known multiple areas of vein graft stenosis which are treated with angioplasty recently by Dr. Standley Brooking. Patient states over the past week he has only been able to walk about 10 yards without discomfort the left calf. He does not have rest pain. He also has severe fatigue and weakening of the left upper extremity secondary to left subclavian occlusion. He has a pacemaker. He has renal insufficiency with a creatinine of 2.68. He is not a candidate for an MRI because of pacemaker and he cannot get a CT angiogram because of the creatinine.  Past Medical History  Diagnosis Date  . Hyperlipidemia   . Gout   . Benign prostatic hypertrophy   . Atrial fibrillation     sees Dr. Verdis Prime  . Chronic systolic dysfunction of left ventricle     a. mixed ischemic and nonischemic CM,  EF 35%. b. s/p AICD implantation.  . CHF (congestive heart failure)   . PAD (peripheral artery disease)     Severe by PV angiogram 09/2011  . ED (erectile dysfunction)   . Renal artery stenosis     s/p stenting 2009  . Arthritis   . Pacemaker     medtronic  . CAD (coronary artery disease)     a. s/p mid LAD stenting with DES 2008 Dr. Verdis Prime  . ICD (implantable cardiac defibrillator) in place     [employer], Dr. Johney Frame  . ICD (implantable cardiac defibrillator) in place     due for check in Feb/2013  . Sleep apnea     hx of "had surgery for"  . Hypertension     sees Dr. Claris Che  . Type II diabetes mellitus     History  Substance Use Topics  . Smoking status: Never Smoker   . Smokeless tobacco: Never Used     Comment: 1-2 cigars when golfing  . Alcohol Use: 2.4 oz/week    2 Glasses of wine, 2 Shots of liquor per week     Comment:  07/10/2012 "galss of wine or vodka tonic 3 X/wk"    Family History  Problem Relation Age of Onset  . Cancer      breast/fhx  . Heart disease      fhx  . Diabetes Neg Hx   . Cancer Father     Allergies  Allergen Reactions  . Other Other (See Comments)    Plastic tape tears skin    Current outpatient prescriptions:carvedilol (COREG) 6.25 MG tablet, Take 1 tablet (6.25 mg total) by mouth 2 (two) times daily with a meal., Disp: 180 tablet, Rfl: 3;  finasteride (PROSCAR) 5 MG tablet, Take 1 tablet (5 mg total) by mouth daily., Disp: 90 tablet, Rfl: 3;  furosemide (LASIX) 40 MG tablet, Take 1 tablet (40 mg total) by mouth daily., Disp: 90 tablet, Rfl: 3 gabapentin (NEURONTIN) 300 MG capsule, Take 1 capsule (300 mg total) by mouth 3 (three) times daily., Disp: 270 capsule, Rfl: 3;  insulin aspart protamine-insulin aspart (NOVOLOG 70/30) (70-30) 100 UNIT/ML injection, Inject 10 Units into the skin 2 (two) times daily with a meal. 10 units with breakfast, and 10 units with the evening meal, Disp: , Rfl:  losartan (COZAAR) 100 MG  tablet, Take 1 tablet (100 mg total) by mouth daily., Disp: 90 tablet, Rfl: 3;  NITROSTAT 0.4 MG SL tablet, Place 0.4 mg under the tongue every 5 (five) minutes as needed for chest pain. For chest pain, max 3 doses, Disp: , Rfl: ;  warfarin (COUMADIN) 5 MG tablet, Take 2.5mg  daily except none on Thrusday, Disp: 90 tablet, Rfl: 0  BP 107/60  Pulse 75  Ht 5\' 8"  (1.727 m)  Wt 188 lb (85.276 kg)  BMI 28.59 kg/m2  SpO2 100%  Body mass index is 28.59 kg/(m^2).          Review of Systems denies chest pain, dyspnea on exertion, PND, orthopnea. He does complain his left arm weakness and left leg claudication    Objective:   Physical Exam blood pressure 107/60 heart rate 75 respirations 16 General well-developed well-nourished male in no apparent stress alert and oriented x3 Lungs no rhonchi or wheezing Left upper extremity with absent pulses Left leg with a 2+  pulse in the subcutaneous femoral anterior tibial graft. The vein becomes quite small below the knee. No palpable dorsalis pedis pulse. Distal flow is somewhat muffled.   Today I ordered a duplex scan of the left femoral anterior tibial graft. There is recurrent stenosis in the proximal portion of the vein graft and the vein becomes quite small distally as none also there is a focal severe stenosis proximal to the distal anastomosis the anterior tibial artery.  Also ordered vein mapping today in the left upper extremity. Cephalic vein is small and not adequate. The basilic vein is adequate from the antecubital area to the axilla. 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 We need to evaluate his left subclavian artery as well but it is of secondary importance.    Plan:     #1 we'll discontinue Coumadin after Friday's dose and plan angiography of left leg by Dr. Myra Gianotti on Tuesday, July 15. We'll post him for revision of left femoral anterior tibial graft on Wednesday using basilic vein graft left arm to replace the lower portion of the bypass. If feasible would also like a selective left subclavian angiogram with small amount of contrast at the same setting since patient will be off of his Coumadin Obviously there are issues here with renal insufficiency. Possibly can use CO2 for the lower extremity study with a very small amount of contrast and possibly could do one small injection the left subclavian artery since patient is not candidate for CT angiogram or MRA

## 2012-09-17 DIAGNOSIS — Z48812 Encounter for surgical aftercare following surgery on the circulatory system: Secondary | ICD-10-CM

## 2012-09-17 LAB — BASIC METABOLIC PANEL
Calcium: 8.2 mg/dL — ABNORMAL LOW (ref 8.4–10.5)
GFR calc non Af Amer: 31 mL/min — ABNORMAL LOW (ref >90)
Sodium: 137 mEq/L (ref 135–145)

## 2012-09-17 LAB — GLUCOSE, CAPILLARY
Glucose-Capillary: 93 mg/dL (ref 70–99)
Glucose-Capillary: 102 mg/dL — ABNORMAL HIGH (ref 70–99)
Glucose-Capillary: 96 mg/dL (ref 70–99)

## 2012-09-17 LAB — APTT: aPTT: 39 seconds — ABNORMAL HIGH (ref 24–37)

## 2012-09-17 LAB — CBC
MCH: 28.5 pg (ref 26.0–34.0)
MCHC: 34.3 g/dL (ref 30.0–36.0)
Platelets: 121 10*3/uL — ABNORMAL LOW (ref 150–400)

## 2012-09-17 LAB — PROTIME-INR: Prothrombin Time: 14.8 seconds (ref 11.6–15.2)

## 2012-09-17 MED ORDER — WARFARIN SODIUM 2.5 MG PO TABS
2.5000 mg | ORAL_TABLET | Freq: Once | ORAL | Status: AC
  Administered 2012-09-17: 2.5 mg via ORAL
  Filled 2012-09-17 (×2): qty 1

## 2012-09-17 MED ORDER — OXYCODONE-ACETAMINOPHEN 5-325 MG PO TABS: 1.0000 | ORAL_TABLET | ORAL | Status: DC | PRN

## 2012-09-17 NOTE — Progress Notes (Signed)
ANTICOAGULATION CONSULT NOTE - Initial Consult  Pharmacy Consult for warfarin Indication: atrial fibrillation  Allergies  Allergen Reactions  . Other Other (See Comments)    Plastic tape tears skin    Patient Measurements: Height: 5\' 8"  (172.7 cm) Weight: 181 lb 3.5 oz (82.2 kg) IBW/kg (Calculated) : 68.4  Vital Signs: Temp: 98.2 F (36.8 C) (07/17 0850) Temp src: Oral (07/17 0850) BP: 117/93 mmHg (07/17 0800) Pulse Rate: 70 (07/17 0800)  Labs:  Recent Labs  09/15/12 1033  09/15/12 1040 09/15/12 1626 09/17/12 0517  HGB  --   < > 14.3 14.7 11.7*  HCT  --   --  42.0 42.0 34.1*  PLT  --   --   --  130* 121*  APTT  --   --   --  28 39*  LABPROT 16.7*  --   --   --  14.8  INR 1.39  --   --   --  1.19  HEPARINUNFRC  --   --   --   --  <0.10*  CREATININE  --   --  2.10* 2.13* 2.00*  < > = values in this interval not displayed.  Estimated Creatinine Clearance: 32.8 ml/min (by C-G formula based on Cr of 2).   Medical History: Past Medical History  Diagnosis Date  . Hyperlipidemia   . Gout   . Benign prostatic hypertrophy   . Atrial fibrillation     sees Dr. Verdis Prime  . Chronic systolic dysfunction of left ventricle     a. mixed ischemic and nonischemic CM,  EF 35%. b. s/p AICD implantation.  . CHF (congestive heart failure)   . PAD (peripheral artery disease)     Severe by PV angiogram 09/2011  . ED (erectile dysfunction)   . Renal artery stenosis     s/p stenting 2009  . Arthritis   . Pacemaker     medtronic  . CAD (coronary artery disease)     a. s/p mid LAD stenting with DES 2008 Dr. Verdis Prime  . ICD (implantable cardiac defibrillator) in place     [employer], Dr. Johney Frame  . ICD (implantable cardiac defibrillator) in place     due for check in Feb/2013  . Sleep apnea     hx of "had surgery for"  . Hypertension     sees Dr. Claris Che  . Type II diabetes mellitus   . Automatic implantable cardioverter-defibrillator in situ     Medications:   Prescriptions prior to admission  Medication Sig Dispense Refill  . carvedilol (COREG) 6.25 MG tablet Take 1 tablet (6.25 mg total) by mouth 2 (two) times daily with a meal.  180 tablet  3  . finasteride (PROSCAR) 5 MG tablet Take 5 mg by mouth daily.      . furosemide (LASIX) 40 MG tablet Take 1 tablet (40 mg total) by mouth daily.  90 tablet  3  . gabapentin (NEURONTIN) 300 MG capsule Take 300 mg by mouth 3 (three) times daily.      . insulin aspart protamine-insulin aspart (NOVOLOG 70/30) (70-30) 100 UNIT/ML injection Inject 10 Units into the skin 2 (two) times daily with a meal. 10 units with breakfast, and 10 units with the evening meal      . losartan (COZAAR) 100 MG tablet Take 1 tablet (100 mg total) by mouth daily.  90 tablet  3  . [DISCONTINUED] HYDROcodone-acetaminophen (NORCO/VICODIN) 5-325 MG per tablet Take 1 tablet by mouth every 6 (six)  hours as needed for pain.       Marland Kitchen NITROSTAT 0.4 MG SL tablet Place 0.4 mg under the tongue every 5 (five) minutes as needed for chest pain. For chest pain, max 3 doses        Assessment: 78 yom on chronic coumadin for afib. INR is subtherapeutic today as expected. He received warfarin 10mg  last night per MD and he is also on low-dose heparin gtt per MD. Likely discharge tomorrow. Pt is slightly anemic and thrombocytopenic. No bleeding noted. MD plans to discharge on his home coumadin regimen.   Goal of Therapy:  INR 2-3 Monitor platelets by anticoagulation protocol: Yes   Plan:  1. Warfarin 2.5mg  PO x 1 tonight 2. F/u AM INR  Sherrel Ploch, Drake Leach 09/17/2012,11:10 AM

## 2012-09-17 NOTE — Progress Notes (Signed)
VASCULAR LAB PRELIMINARY  ARTERIAL  ABI completed:    RIGHT    LEFT    PRESSURE WAVEFORM  PRESSURE WAVEFORM  BRACHIAL 145 Triphasic  BRACHIAL    DP   DP absent   AT 123 Monophasic  AT    PT 309 Monophasic  PT absent   PER   PER absent   GREAT TOE  NA GREAT TOE  NA    RIGHT LEFT  ABI NA NA     [name], RVT 09/17/2012, 4:08 PM

## 2012-09-17 NOTE — Progress Notes (Signed)
Patient being transferred to 2007 via wheelchair. Phone report called to Danielle,rn. Patient is aware of the transfer.

## 2012-09-17 NOTE — Progress Notes (Signed)
Utilization review completed.  

## 2012-09-17 NOTE — Progress Notes (Signed)
Physical Therapy Evaluation Patient Details Name: [name] MRN: [id number] DOB: 1934/04/13 Today's Date: 09/17/2012 Time: 7829-5621 PT Time Calculation (min): 15 min  PT Assessment / Plan / Recommendation History of Present Illness  Pt admit with left fem pop BPG.    Clinical Impression  Pt admitted with left fem pop BPG. Pt currently with functional limitations due to the deficits listed below (see PT Problem List). Pt appears to be fairly safe but did not want to ambulate in halls.  Will return in am and ensure that pt is safe using cane.   Pt will benefit from skilled PT to increase their independence and safety with mobility to allow discharge to the venue listed below.     PT Assessment  Patient needs continued PT services    Follow Up Recommendations  No PT follow up                Equipment Recommendations  None recommended by PT         Frequency Min 3X/week    Precautions / Restrictions Precautions Precautions: None Restrictions Weight Bearing Restrictions: No   Pertinent Vitals/Pain VSS, some pain per pt left LE      Mobility  Bed Mobility Bed Mobility: Sit to Supine Sit to Supine: 5: Supervision;HOB flat Transfers Transfers: Sit to Stand;Stand to Sit Sit to Stand: 5: Supervision;From bed Stand to Sit: 5: Supervision;To bed Ambulation/Gait Ambulation/Gait Assistance: 4: Min guard Ambulation Distance (Feet): 15 Feet Assistive device: None Ambulation/Gait Assistance Details: Pt coming out of bathroom on arrival.  Pt declined to ambulate in hall.  Observed pt ambulate from bathroom to bed pushing IV pole with steady gait overall.  Pt and wife state that pt ambulated in hall earlier today.   Gait Pattern: Step-to pattern;Decreased stance time - left;Left flexed knee in stance Stairs: No Wheelchair Mobility Wheelchair Mobility: No    Exercises General Exercises - Lower Extremity Ankle Circles/Pumps: AROM;Both;10 reps;Supine Quad Sets: AROM;Both;10  reps;Supine Long Arc Quad: AROM;Both;10 reps;Seated Other Exercises Other Exercises: Demonstrated and instructed in heel cord stretches using sheet 5x 5xday.   PT Diagnosis: Acute pain;Generalized weakness  PT Problem List: Decreased activity tolerance;Decreased range of motion;Decreased balance;Decreased mobility;Decreased knowledge of use of DME;Decreased safety awareness;Pain PT Treatment Interventions: DME instruction;Gait training;Functional mobility training;Balance training;Therapeutic exercise;Therapeutic activities;Patient/family education     PT Goals(Current goals can be found in the care plan section) Acute Rehab PT Goals Patient Stated Goal: to return home PT Goal Formulation: With patient Time For Goal Achievement: 09/24/12 Potential to Achieve Goals: Good  Visit Information  Last PT Received On: 09/17/12 Assistance Needed: +1 PT/OT Co-Evaluation/Treatment: Yes History of Present Illness: Pt admit with left fem pop BPG.         Prior Functioning  Home Living Family/patient expects to be discharged to:: Private residence Living Arrangements: Spouse/significant other Available Help at Discharge: Family;Available 24 hours/day Type of Home: House Home Access: Level entry Home Layout: One level Home Equipment: Cane - single point Prior Function Level of Independence: Independent Communication Communication: No difficulties Dominant Hand: Right    Cognition  Cognition Arousal/Alertness: Awake/alert Behavior During Therapy: WFL for tasks assessed/performed Overall Cognitive Status: Within Functional Limits for tasks assessed    Extremity/Trunk Assessment Upper Extremity Assessment Upper Extremity Assessment: Defer to OT evaluation Lower Extremity Assessment Lower Extremity Assessment: LLE deficits/detail LLE Deficits / Details: grossly 3+/5 with tightness noted in heel cord Cervical / Trunk Assessment Cervical / Trunk Assessment: Normal   Balance  Balance Balance  Assessed: Yes Static Standing Balance Static Standing - Balance Support: No upper extremity supported;During functional activity Static Standing - Level of Assistance: 5: Stand by assistance Static Standing - Comment/# of Minutes: 3  End of Session PT - End of Session Equipment Utilized During Treatment: Gait belt Activity Tolerance: Patient tolerated treatment well Patient left: in bed;with call bell/phone within reach;with family/visitor present Nurse Communication: Mobility status       INGOLD,[name] 09/17/2012, 4:54 PM [facility] Acute Rehabilitation [phone] (pager)

## 2012-09-17 NOTE — Progress Notes (Addendum)
[facility] [place]  Progress Note Bypass Surgery  Date of Surgery: 09/16/2012  Procedure(s): LEFT FEMORAL-POPLITEAL BYPASS GRAFT WITH GORTEX Propaten Graft 6x80 Thin Wall and Left lower leg Angiogram Surgeon: Surgeon(s): Pryor Ochoa, MD  1 Day Post-Op  History of Present Illness  [name] is a 78 y.o. male who is  1 Day Post-Op. The patient's pre-op symptoms of rest pain are Improved . Patients pain is well controlled.    VASC. LAB Studies:    pending  Imaging: Dg Ang/ext/uni/or Left  09/16/2012   *RADIOLOGY REPORT*  Clinical data:  Peripheral vascular disease, arterial graft  INTRAOPERATIVE ARTERIOGRAM  Comparison:  04/02/2012  Findings:  Single intraoperative portable image demonstrates a graft to the popliteal artery below the knee.  Distal anastomosis appears widely patent.  The tibioperoneal trunk is occluded proximally.  There is a diffusely diseased anterior tibial artery which is opacified to the lower calf.  IMPRESSION: Patent graft to the low popliteal artery with diseased anterior tibial runoff.   Original Report Authenticated By: D. Andria Rhein, MD    Significant Diagnostic Studies: CBC Lab Results  Component Value Date   WBC 5.8 09/17/2012   HGB 11.7* 09/17/2012   HCT 34.1* 09/17/2012   MCV 83.2 09/17/2012   PLT 121* 09/17/2012    BMET     Component Value Date/Time   NA 137 09/17/2012 0517   K 4.7 09/17/2012 0517   CL 109 09/17/2012 0517   CO2 21 09/17/2012 0517   GLUCOSE 112* 09/17/2012 0517   BUN 33* 09/17/2012 0517   CREATININE 2.00* 09/17/2012 0517   CREATININE 2.68* 09/07/2012 0812   CALCIUM 8.2* 09/17/2012 0517   GFRNONAA 31* 09/17/2012 0517   GFRAA 36* 09/17/2012 0517    COAG Lab Results  Component Value Date   INR 1.19 09/17/2012   INR 1.39 09/15/2012   INR 2.1 09/07/2012   No results found for this basename: PTT    Physical Examination  BP Readings from Last 3 Encounters:  09/17/12 123/70  09/17/12 123/70  09/15/12  158/83   Temp Readings from Last 3 Encounters:  09/17/12 97.6 F (36.4 C) Oral  09/17/12 97.6 F (36.4 C) Oral  09/15/12 97.5 F (36.4 C)    SpO2 Readings from Last 3 Encounters:  09/17/12 100%  09/17/12 100%  09/15/12 99%   Pulse Readings from Last 3 Encounters:  09/17/12 70  09/17/12 70  09/15/12 75    Pt is A&O x 3 left lower extremity: Incision/s is/are clean,dry.intact, and  clean, dry, intact without hematoma, erythema or drainage Limb is warm; with good color AT doppler strong monophasic Right groin stick site clean and dry no hematoma.  Assessment/Plan: Pt. Doing well Post-op pain is controlled Wounds are healing well PT/OT for ambulation Home verses transfer to 2000 He has not ambulated yet or had a solid diet yet.  He was able to void independently this am.  Clinton Gallant [facility]  09/17/2012 7:19 AM  Agree with above assessment Both surgical wounds look good with no hematoma Brisk monophasic flow in distal left PT-left foot pink warm and well perfused Patient tolerating diet well  Plan to continue heparin for another 24 hours and then discontinued and DC home tomorrow on regular Coumadin dose To be followed up in his regular Coumadin clinic postop next week

## 2012-09-17 NOTE — Progress Notes (Signed)
[facility] [place]  Called to see pt with cool left foot and ABI without any signals.  Pt is currently asx.  L lower leg and foot is cool.  Pt has intact motor currently.  Clinically fem-pop BPG likely occluded.  Per my conversation with Dr. Hart Rochester, thrombectomy unlikely to be successful.  As pt does not have any rest pain currently, he recommends no immediate interventions.  Dr. Hart Rochester will re-evaluate the patient in the AM.  Leonides Sake, MD [facility]: [phone] Pager: [phone]  09/17/2012, 5:36 PM

## 2012-09-17 NOTE — Evaluation (Signed)
Occupational Therapy Evaluation Patient Details Name: [name] MRN: [id number] DOB: [birth year] Today's Date: 09/17/2012 Time: 4098-1191 OT Time Calculation (min): 15 min  OT Assessment / Plan / Recommendation History of present illness  s/p left fem-pop BPG    Clinical Impression   Pt is performing basic functional mobility and ADLs at overall supervision level with occasional need for min assist with LB dressing. Pt's significant other demonstrated independence in assisting pt and will be with pt 24/7 at d/c.  No further acute OT needed.    OT Assessment  Patient does not need any further OT services    Follow Up Recommendations  No OT follow up;Supervision/Assistance - 24 hour    Barriers to Discharge      Equipment Recommendations  None recommended by OT    Recommendations for Other Services    Frequency       Precautions / Restrictions     Pertinent Vitals/Pain See vitals    ADL  Grooming: Performed;Wash/dry hands;Supervision/safety Where Assessed - Grooming: Unsupported standing Lower Body Dressing: Performed;Minimal assistance Where Assessed - Lower Body Dressing: Unsupported sitting Toilet Transfer: Performed;Supervision/safety Toilet Transfer Method: Sit to Barista: Regular height toilet;Grab bars Toileting - Clothing Manipulation and Hygiene: Performed;Supervision/safety Where Assessed - Toileting Clothing Manipulation and Hygiene: Standing Transfers/Ambulation Related to ADLs: Supervision while pushing IV pole. ADL Comments: Pt up ambulating to bathroom with signifcant other upon OT arrival.  Pt performing ADL tasks at overall supervision level with need for min assist for LB tasks due to LLE pain. Significant other demonstrating independence in assisting pt. Pt refusing practicing tub transfer stating he has "been through this before and feels comfortable getting in the tub".  Pt and significant other both verbalize that they feel  comfortable going home and have no other concerns about ADLs/self care.    OT Diagnosis:    OT Problem List:   OT Treatment Interventions:     OT Goals(Current goals can be found in the care plan section) Acute Rehab OT Goals Patient Stated Goal: to return home  Visit Information  Last OT Received On: 09/17/12 Assistance Needed: +1 PT/OT Co-Evaluation/Treatment: Yes       Prior Functioning     Home Living Family/patient expects to be discharged to:: Private residence Living Arrangements: Spouse/significant other Available Help at Discharge: Family;Available 24 hours/day Type of Home: House Home Access: Level entry Home Layout: One level Home Equipment: Cane - single point Prior Function Level of Independence: Independent Communication Communication: No difficulties Dominant Hand: Right         Vision/Perception Vision - History Baseline Vision: Wears glasses only for reading   Cognition  Cognition Arousal/Alertness: Awake/alert Behavior During Therapy: WFL for tasks assessed/performed Overall Cognitive Status: Within Functional Limits for tasks assessed    Extremity/Trunk Assessment Upper Extremity Assessment Upper Extremity Assessment: Overall WFL for tasks assessed     Mobility Bed Mobility Bed Mobility: Sit to Supine Sit to Supine: 5: Supervision;HOB flat Transfers Transfers: Sit to Stand;Stand to Sit Sit to Stand: 5: Supervision;From bed Stand to Sit: 5: Supervision;To bed     Exercise     Balance     End of Session OT - End of Session Equipment Utilized During Treatment: Gait belt Activity Tolerance: Patient tolerated treatment well Patient left: in bed;with call bell/phone within reach;with family/visitor present Nurse Communication: Mobility status  GO    09/17/2012 Cipriano Mile OTR/L Pager [phone] Office [phone]  Cipriano Mile 09/17/2012, 3:55 PM

## 2012-09-18 ENCOUNTER — Other Ambulatory Visit: Payer: Self-pay | Admitting: *Deleted

## 2012-09-18 ENCOUNTER — Telehealth: Payer: Self-pay | Admitting: Vascular Surgery

## 2012-09-18 DIAGNOSIS — I739 Peripheral vascular disease, unspecified: Secondary | ICD-10-CM

## 2012-09-18 DIAGNOSIS — Z48812 Encounter for surgical aftercare following surgery on the circulatory system: Secondary | ICD-10-CM

## 2012-09-18 LAB — APTT: aPTT: 45 seconds — ABNORMAL HIGH (ref 24–37)

## 2012-09-18 MED ORDER — WARFARIN SODIUM 2.5 MG PO TABS: 2.5000 mg | ORAL_TABLET | Freq: Every day | ORAL | Status: DC

## 2012-09-18 MED ORDER — WARFARIN SODIUM 2.5 MG PO TABS
2.5000 mg | ORAL_TABLET | Freq: Every day | ORAL | Status: DC
  Filled 2012-09-18: qty 1

## 2012-09-18 NOTE — Telephone Encounter (Signed)
done

## 2012-09-18 NOTE — Progress Notes (Signed)
[facility] Progress Note  09/18/2012 7:36 AM 2 Days Post-Op  Subjective:  Pt without complaints; has ambulated  Afebrile VSS 98% RA  Filed Vitals:   09/18/12 0518  BP: 135/60  Pulse: 71  Temp: 98.6 F (37 C)  Resp: 18    Physical Exam: Incisions:  Left groin incision is clean and dry Extremities:  Left foot is cooler than the right; + monophasic peroneal signal; right foot with good DP/PT signal.  CBC    Component Value Date/Time   WBC 5.8 09/17/2012 0517   RBC 4.10* 09/17/2012 0517   HGB 11.7* 09/17/2012 0517   HCT 34.1* 09/17/2012 0517   PLT 121* 09/17/2012 0517   MCV 83.2 09/17/2012 0517   MCH 28.5 09/17/2012 0517   MCHC 34.3 09/17/2012 0517   RDW 15.1 09/17/2012 0517   LYMPHSABS 1.4 07/10/2012 2020   MONOABS 1.0 07/10/2012 2020   EOSABS 0.1 07/10/2012 2020   BASOSABS 0.0 07/10/2012 2020    BMET    Component Value Date/Time   NA 137 09/17/2012 0517   K 4.7 09/17/2012 0517   CL 109 09/17/2012 0517   CO2 21 09/17/2012 0517   GLUCOSE 112* 09/17/2012 0517   BUN 33* 09/17/2012 0517   CREATININE 2.00* 09/17/2012 0517   CREATININE 2.68* 09/07/2012 0812   CALCIUM 8.2* 09/17/2012 0517   GFRNONAA 31* 09/17/2012 0517   GFRAA 36* 09/17/2012 0517    INR    Component Value Date/Time   INR 1.31 09/18/2012 0425   INR 2.1 09/07/2012 0856     Intake/Output Summary (Last 24 hours) at 09/18/12 0736 Last data filed at 09/18/12 0518  Gross per 24 hour  Intake    800 ml  Output   1675 ml  Net   -875 ml     Assessment/Plan:  78 y.o. male is s/p:  LEFT FEMORAL-POPLITEAL BYPASS GRAFT WITH GORTEX Propaten Graft 6x80 Thin Wall and Left lower leg Angiogram   2 Days Post-Op  -pt left foot is cool-i cannot palpate pedal pulses; there is a monophasic signal in the left peroneal; pt is without pain. -DVT prophylaxis:  Heparin gtt -coumadin given the past 2 nights INR is 1.31-will d/c home on his regular regimen.  F/u next week to get INR checked. -BUN/Cr is  improving  Doreatha Massed, PA-C [facility] [phone] 09/18/2012 7:36 AM

## 2012-09-18 NOTE — Progress Notes (Signed)
Reviewed discharge instructions with teach back, answers appropiately. DC home with paperwork [name]

## 2012-09-18 NOTE — Telephone Encounter (Signed)
I faxed over note to below number and left a voice message for pt.

## 2012-09-18 NOTE — Discharge Summary (Signed)
[facility] Discharge Summary  [name] 06-23-1934 78 y.o. 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 male returns for continued followup regarding his left lower extremity bypass graft. This has been revised and replaced using vein from both lower extremities. He has known multiple areas of vein graft stenosis which are treated with angioplasty recently by Dr. Standley Brooking. Patient states over the past week he has only been able to walk about 10 yards without discomfort the left calf. He does not have rest pain. He also has severe fatigue and weakening of the left upper extremity secondary to left subclavian occlusion. He has a pacemaker. He has renal insufficiency with a creatinine of 2.68. He is not a candidate for an MRI because of pacemaker and he cannot get a CT angiogram because of the creatinine.   Hospital Course:  The patient was admitted to the hospital and taken to the operating room on 09/16/2012 and underwent: LEFT FEMORAL-POPLITEAL BYPASS GRAFT WITH GORTEX Propaten Graft 6x80 Thin Wall and Left lower leg Angiogram    The pt tolerated the procedure well and was transported to the PACU in good condition. Pt was doing well on POD 1.  He was continued on a heparin gtt and restarted on his coumadin regimen.  He was transferred to the telemetry floor on POD 1.  ABI's on POD 1:  RIGHT    LEFT     PRESSURE  WAVEFORM   PRESSURE  WAVEFORM   BRACHIAL  145  Triphasic  BRACHIAL     DP    DP  absent    AT  123  Monophasic  AT     PT  309  Monophasic  PT  absent    PER    PER  absent    GREAT TOE   NA  GREAT TOE   NA     RIGHT  LEFT   ABI  NA  NA    Dr. Imogene Burn was Called to see pt with cool left foot and ABI without any signals. Pt is currently asx. L lower leg and foot is cool. Pt has intact motor  currently. Clinically fem-pop BPG likely occluded. Per my conversation with Dr. Hart Rochester, thrombectomy unlikely to be successful. As pt does not have any rest pain currently, he recommends no immediate interventions. Dr. Hart Rochester will re-evaluate the patient in the AM.  Pt's BUN/Cr was improving.  He is to f/u next week to get his INR checked.  The remainder of the hospital course consisted of increasing mobilization and increasing intake of solids without difficulty.  CBC    Component Value Date/Time   WBC 5.8 09/17/2012 0517   RBC 4.10* 09/17/2012 0517   HGB 11.7* 09/17/2012 0517   HCT 34.1* 09/17/2012 0517   PLT 121* 09/17/2012 0517   MCV 83.2 09/17/2012 0517   MCH 28.5 09/17/2012 0517   MCHC 34.3 09/17/2012 0517   RDW 15.1 09/17/2012 0517   LYMPHSABS 1.4 07/10/2012 2020   MONOABS 1.0 07/10/2012 2020   EOSABS 0.1 07/10/2012 2020   BASOSABS 0.0 07/10/2012 2020    BMET    Component Value Date/Time   NA 137 09/17/2012 0517   K 4.7 09/17/2012 0517   CL 109 09/17/2012 0517   CO2 21 09/17/2012 0517   GLUCOSE 112* 09/17/2012 0517  BUN 33* 09/17/2012 0517   CREATININE 2.00* 09/17/2012 0517   CREATININE 2.68* 09/07/2012 0812   CALCIUM 8.2* 09/17/2012 0517   GFRNONAA 31* 09/17/2012 0517   GFRAA 36* 09/17/2012 0517     Discharge Instructions:   The patient is discharged to home with extensive instructions on wound care and progressive ambulation.  They are instructed not to drive or perform any heavy lifting until returning to see the physician in his office.  Discharge Orders   Future Appointments Provider Department Dept Phone   10/05/2012 9:00 AM [facility] Coumadin [facility] [place] [phone]   Future Orders Complete By Expires     Call MD for:  redness, tenderness, or signs of infection (pain, swelling, bleeding, redness, odor or green/yellow discharge around incision site)  As directed     Call MD for:  severe or increased pain, loss or decreased feeling  in affected limb(s)  As directed      Call MD for:  temperature >100.5  As directed     Driving Restrictions  As directed     Comments:      No driving for 2-4 weeks    Increase activity slowly  As directed     Comments:      Walk with assistance use walker or cane as needed    Lifting restrictions  As directed     Comments:      No lifting for 6 weeks    May shower   As directed     Resume previous diet  As directed        Discharge Diagnosis:  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 Subclavian artery stenosis 09/08/2012  . Peripheral vascular disease, unspecified 08/18/2012  . Fever 07/08/2012  . Leg edema 06/02/2012  . Cardiomyopathy, ischemic 05/18/2012  . S/P ICD (internal cardiac defibrillator) procedure 05/18/2012  . Diabetes 05/18/2012  . Pain in limb 02/07/2012  . Atherosclerosis of native arteries of the extremities with intermittent claudication 12/17/2011  . Atherosclerotic PVD with intermittent claudication 10/15/2011  . Type I (juvenile type) diabetes mellitus with peripheral circulatory disorders, uncontrolled 09/06/2011  . PAD (peripheral artery disease) 09/04/2011  . CAD (coronary artery disease)   . Burning sensation of feet 05/10/2011  . Bursitis of right hip 05/10/2011  . Cervicalgia 05/10/2011  . Balance disorder 05/10/2011  . Neurogenic claudication 05/10/2011  . Lumbago 05/10/2011  . Chronic systolic congestive heart failure 07/06/2010  . ECZEMA 01/17/2010  . RENAL INSUFFICIENCY 11/01/2009  . UTI 11/01/2009  . CELLULITIS, LEG, RIGHT 08/22/2009  . LYMPHADENOPATHY, REACTIVE 08/22/2009  . PERIPHERAL NEUROPATHY 01/24/2009  . DIZZINESS 12/29/2008  . ABDOMINAL PAIN, GENERALIZED 08/22/2008  . GOUT 09/18/2007  . CORONARY ARTERY DISEASE 09/18/2007  . HYPERLIPIDEMIA 01/23/2007  . HYPERTENSION 01/23/2007  . Atrial fibrillation 01/23/2007  . BENIGN PROSTATIC HYPERTROPHY 01/23/2007  . LATERAL  EPICONDYLITIS 01/23/2007   Past Medical History  Diagnosis Date  . Hyperlipidemia   . Gout   . Benign prostatic hypertrophy   . Atrial fibrillation     sees Dr. Verdis Prime  . Chronic systolic dysfunction of left ventricle     a. mixed ischemic and nonischemic CM,  EF 35%. b. s/p AICD implantation.  . CHF (congestive heart failure)   . PAD (peripheral artery disease)     Severe by PV angiogram 09/2011  . ED (erectile dysfunction)   . Renal artery stenosis  s/p stenting 2009  . Arthritis   . Pacemaker     medtronic  . CAD (coronary artery disease)     a. s/p mid LAD stenting with DES 2008 Dr. Verdis Prime  . ICD (implantable cardiac defibrillator) in place     medtronic, Dr. Johney Frame  . ICD (implantable cardiac defibrillator) in place     due for check in Feb/2013  . Sleep apnea     hx of "had surgery for"  . Hypertension     sees Dr. Claris Che  . Type II diabetes mellitus   . Automatic implantable cardioverter-defibrillator in situ        Medication List    STOP taking these medications       HYDROcodone-acetaminophen 5-325 MG per tablet  Commonly known as:  NORCO/VICODIN      TAKE these medications       carvedilol 6.25 MG tablet  Commonly known as:  COREG  Take 1 tablet (6.25 mg total) by mouth 2 (two) times daily with a meal.     finasteride 5 MG tablet  Commonly known as:  PROSCAR  Take 5 mg by mouth daily.     furosemide 40 MG tablet  Commonly known as:  LASIX  Take 1 tablet (40 mg total) by mouth daily.     gabapentin 300 MG capsule  Commonly known as:  NEURONTIN  Take 300 mg by mouth 3 (three) times daily.     insulin aspart protamine- aspart (70-30) 100 UNIT/ML injection  Commonly known as:  NOVOLOG MIX 70/30  Inject 10 Units into the skin 2 (two) times daily with a meal. 10 units with breakfast, and 10 units with the evening meal     losartan 100 MG tablet  Commonly known as:  COZAAR  Take 1 tablet (100 mg total) by mouth daily.     NITROSTAT  0.4 MG SL tablet  Generic drug:  nitroGLYCERIN  Place 0.4 mg under the tongue every 5 (five) minutes as needed for chest pain. For chest pain, max 3 doses     oxyCODONE-acetaminophen 5-325 MG per tablet  Commonly known as:  PERCOCET/ROXICET  Take 1-2 tablets by mouth every 4 (four) hours as needed.     warfarin 2.5 MG tablet  Commonly known as:  COUMADIN  Take 1 tablet (2.5 mg total) by mouth daily.        Disposition: home  Patient's condition: is Good  Follow up: 1. Dr. Hart Rochester in 2 weeks with duplex and ABI's   Doreatha Massed, PA-C [facility] [phone] 09/18/2012  7:56 AM  - For VQI Registry use --- Instructions: Press F2 to tab through selections.  Delete question if not applicable.   Post-op:  Wound infection: No  Graft infection: No  Transfusion: No  If yes, n/a units given New Arrhythmia: No Ipsilateral amputation: No, [ ]  Minor, [ ]  BKA, [ ]  AKA Discharge patency: [ ]  Primary, [ ]  Primary assisted, [ ]  Secondary, [ x] possibly Occluded Patency judged by: [ ]  Dopper only, [ ]  Palpable graft pulse, [ ]  Palpable distal pulse, [ ]  ABI inc. > 0.15, [ ]  Duplex Discharge ABI: R n/a, L n/a Discharge TBI: R , L  D/C Ambulatory Status: Ambulatory  Complications: MI: No, [ ]  Troponin only, [ ]  EKG or Clinical CHF: No Resp failure:No, [ ]  Pneumonia, [ ]  Ventilator Chg in renal function: No, [ ]  Inc. Cr > 0.5, [ ]  Temp. Dialysis, [ ]  Permanent  dialysis Stroke: No, [ ]  Minor, [ ]  Major Return to OR: No  Reason for return to OR: [ ]  Bleeding, [ ]  Infection, [ ]  Thrombosis, [ ]  Revision  Discharge medications: Statin use: No ASA use:  Yes Plavix use:  No  for medical reason no prescribed Beta blocker use: Yes Coumadin use: Yes

## 2012-09-18 NOTE — Progress Notes (Signed)
ANTICOAGULATION CONSULT NOTE - Initial Consult  Pharmacy Consult for warfarin Indication: atrial fibrillation  Allergies  Allergen Reactions  . Other Other (See Comments)    Plastic tape tears skin    Patient Measurements: Height: 5\' 8"  (172.7 cm) Weight: 181 lb 3.5 oz (82.2 kg) IBW/kg (Calculated) : 68.4  Vital Signs: Temp: 98.6 F (37 C) (07/18 0518) Temp src: Oral (07/18 0518) BP: 135/60 mmHg (07/18 0518) Pulse Rate: 71 (07/18 0518)  Labs:  Recent Labs  09/15/12 1033  09/15/12 1040 09/15/12 1626 09/17/12 0517 09/18/12 0425  HGB  --   < > 14.3 14.7 11.7*  --   HCT  --   --  42.0 42.0 34.1*  --   PLT  --   --   --  130* 121*  --   APTT  --   --   --  28 39* 45*  LABPROT 16.7*  --   --   --  14.8 16.0*  INR 1.39  --   --   --  1.19 1.31  HEPARINUNFRC  --   --   --   --  <0.10*  --   CREATININE  --   --  2.10* 2.13* 2.00*  --   < > = values in this interval not displayed.  Estimated Creatinine Clearance: 32.8 ml/min (by C-G formula based on Cr of 2).   Medical History: Past Medical History  Diagnosis Date  . Hyperlipidemia   . Gout   . Benign prostatic hypertrophy   . Atrial fibrillation     sees Dr. Verdis Prime  . Chronic systolic dysfunction of left ventricle     a. mixed ischemic and nonischemic CM,  EF 35%. b. s/p AICD implantation.  . CHF (congestive heart failure)   . PAD (peripheral artery disease)     Severe by PV angiogram 09/2011  . ED (erectile dysfunction)   . Renal artery stenosis     s/p stenting 2009  . Arthritis   . Pacemaker     medtronic  . CAD (coronary artery disease)     a. s/p mid LAD stenting with DES 2008 Dr. Verdis Prime  . ICD (implantable cardiac defibrillator) in place     medtronic, Dr. Johney Frame  . ICD (implantable cardiac defibrillator) in place     due for check in Feb/2013  . Sleep apnea     hx of "had surgery for"  . Hypertension     sees Dr. Claris Che  . Type II diabetes mellitus   . Automatic implantable  cardioverter-defibrillator in situ     Medications:  Prescriptions prior to admission  Medication Sig Dispense Refill  . carvedilol (COREG) 6.25 MG tablet Take 1 tablet (6.25 mg total) by mouth 2 (two) times daily with a meal.  180 tablet  3  . finasteride (PROSCAR) 5 MG tablet Take 5 mg by mouth daily.      . furosemide (LASIX) 40 MG tablet Take 1 tablet (40 mg total) by mouth daily.  90 tablet  3  . gabapentin (NEURONTIN) 300 MG capsule Take 300 mg by mouth 3 (three) times daily.      . insulin aspart protamine-insulin aspart (NOVOLOG 70/30) (70-30) 100 UNIT/ML injection Inject 10 Units into the skin 2 (two) times daily with a meal. 10 units with breakfast, and 10 units with the evening meal      . losartan (COZAAR) 100 MG tablet Take 1 tablet (100 mg total) by mouth daily.  90  tablet  3  . [DISCONTINUED] HYDROcodone-acetaminophen (NORCO/VICODIN) 5-325 MG per tablet Take 1 tablet by mouth every 6 (six) hours as needed for pain.       Marland Kitchen NITROSTAT 0.4 MG SL tablet Place 0.4 mg under the tongue every 5 (five) minutes as needed for chest pain. For chest pain, max 3 doses        Assessment: 78 yom on chronic coumadin for afib. INR is subtherapeutic today as expected. He received warfarin 2.5mg  and he is also on low-dose heparin gtt per MD. Likely discharge soon.  Pt is slightly anemic and thrombocytopenic. No bleeding noted. MD plans to discharge on his home coumadin regimen.   Goal of Therapy:  INR 2-3 Monitor platelets by anticoagulation protocol: Yes   Plan:  1. Warfarin 2.5mg  PO x 1 tonight 2. F/u AM INR  Wendie Simmer, PharmD, BCPS Clinical Pharmacist  Pager: [phone]

## 2012-09-18 NOTE — Telephone Encounter (Addendum)
Message copied by Rosalyn Charters on Fri Sep 18, 2012  9:14 AM ------      Message from: Sharee Pimple      Created: Fri Sep 18, 2012  8:24 AM      Regarding: schedule                   ----- Message -----         From: Dara Lords, PA-C         Sent: 09/18/2012   7:55 AM           To: Sharee Pimple, CMA            S/p LEFT FEMORAL-POPLITEAL BYPASS GRAFT WITH GORTEX Propaten Graft 6x80 Thin Wall and Left lower leg Angiogram         On 09/16/12.  F/u with Dr. Hart Rochester in 2-3 weeks.  Pt will need duplex of graft and ABI's at that visit.            Thanks,      Lelon Mast ------  notified patient of fu appt. on 11-03-12 12:30 with dr. Hart Rochester

## 2012-09-21 ENCOUNTER — Telehealth: Payer: Self-pay

## 2012-09-21 ENCOUNTER — Encounter: Payer: Self-pay | Admitting: Vascular Surgery

## 2012-09-21 ENCOUNTER — Encounter (HOSPITAL_COMMUNITY): Payer: Self-pay | Admitting: Vascular Surgery

## 2012-09-21 NOTE — Telephone Encounter (Signed)
Phone call from pt. with c/o being "in a lot of pain."  States has a "stabbing pain in left foot", that is worse with laying down.  States unable to sleep the past 3 nights.  States his foot gets cool intermittently, and he has been using heat applications to warm his foot.  States has some pain medication left.  Scheduled pt. to see Dr. Hart Rochester tomorrow with vascular studies, instead of 2 wk. F/u appt.  Pt. agrees with plan.

## 2012-09-22 ENCOUNTER — Ambulatory Visit (INDEPENDENT_AMBULATORY_CARE_PROVIDER_SITE_OTHER): Payer: Medicare Other | Admitting: Vascular Surgery

## 2012-09-22 ENCOUNTER — Encounter: Payer: Self-pay | Admitting: Vascular Surgery

## 2012-09-22 ENCOUNTER — Other Ambulatory Visit: Payer: Self-pay

## 2012-09-22 VITALS — BP 138/68 | HR 73 | Temp 97.9°F | Ht 68.0 in | Wt 189.6 lb

## 2012-09-22 DIAGNOSIS — M79609 Pain in unspecified limb: Secondary | ICD-10-CM

## 2012-09-22 DIAGNOSIS — I739 Peripheral vascular disease, unspecified: Secondary | ICD-10-CM

## 2012-09-22 MED ORDER — OXYCODONE-ACETAMINOPHEN 5-325 MG PO TABS: 1.0000 | ORAL_TABLET | ORAL | Status: DC | PRN

## 2012-09-22 NOTE — Progress Notes (Signed)
Subjective:     Patient ID: [name], male   DOB: 10/11/1934, 78 y.o.   MRN: [id number]  HPI this 78-year-old male returns today with known occlusion of his attempted left femoral popliteal Gore-Tex bypass last week. He has very poor veins for conduits and we attempted a below knee femoral-popliteal Gore-Tex graft into a very disadvantaged runoff situation. This was unsuccessful occluding about 36 hours later. His only option now is a bypass into one vessel runoff anterior tibial artery in the mid left lower leg. He is experiencing rest pain at night but has developed no infection or cellulitis.  Review of Systems     Objective:   Physical Exam BP 138/68  Pulse 73  Temp(Src) 97.9 F (36.6 C) (Oral)  Ht 5' 8" (1.727 m)  Wt 189 lb 9.6 oz (86.002 kg)  BMI 28.84 kg/m2  SpO2 100%  General well-developed well-nourished male in no apparent stress alert and oriented x3 Left leg with dependent rubor. 2-3+ femoral pulse palpable. Incision is healing satisfactorily.  There is audible flow in his left anterior tibial artery which was sluggish and monophasic.   I independently imaged his left arm veins the cephalic anteriorly and an apparent posterior forearm vein on the left. These are marginal in size and I do not think there would be adequate length to reach the distal vessel.      Assessment:     Severe femoral popliteal and tibial occlusive disease with rest ischemia and failed attempts at bypass because of poor vein conduits and poor runoff Only remaining option would be to attempt a femoral to distal anterior tibial bypass graft possibly using a composite Gore-Tex with vein from left arm area patient also has occluded left subclavian artery Concerned about Making Multiple Incisions in the Distal Left Upper Extremity with Potential Healing Problems Discussed All These Problems and Issues with the Patient and He Does Have an Understanding and Realizes He Is Rapidly Heading toward an  Amputation in the Left Leg    Plan:     Offered patient surgery this Friday but he would prefer to wait till the middle of next week so we'll schedule this for Wednesday, July 30 Plan left femoral to anterior tibial graft with either Gore-Tex or Gore-Tex vein composite. Will leave patient off of his Coumadin for chronic A. fib until postoperatively  Patient understands that the likelihood of success particularly long-term success is low but he would like to make one more attempt      

## 2012-09-23 ENCOUNTER — Encounter (HOSPITAL_COMMUNITY): Payer: Self-pay | Admitting: Pharmacy Technician

## 2012-09-24 ENCOUNTER — Telehealth: Payer: Self-pay | Admitting: Family Medicine

## 2012-09-24 NOTE — Telephone Encounter (Signed)
Cymbalta would be good to try but I would add this to the Gabapentin. Do not stop Gabapentin. Call in Cymbalta 30 mg daily, #30 with 5 rf

## 2012-09-24 NOTE — Telephone Encounter (Signed)
Nurse called and stated that the pt is scheduled for another vasuclar surgery next week. This is the 8th one this year, and the pt is very depressed. She wants to take him off of gabapentin and put him on cymbalta. She is requesting your approval. Please assist.

## 2012-09-25 MED ORDER — DULOXETINE HCL 30 MG PO CPEP: 30.0000 mg | ORAL_CAPSULE | Freq: Every day | ORAL | Status: DC

## 2012-09-25 NOTE — Telephone Encounter (Signed)
I sent script e-scribe and spoke with Noralyn Pick a nurse from [facility] and she said that she will notify pt of the new medication.

## 2012-09-28 ENCOUNTER — Encounter (HOSPITAL_COMMUNITY)
Admission: RE | Admit: 2012-09-28 | Discharge: 2012-09-28 | Disposition: A | Discharge status: Home / Self Care | Payer: Medicare Other | Source: Ambulatory Visit | Attending: Vascular Surgery | Admitting: Vascular Surgery

## 2012-09-28 ENCOUNTER — Encounter (HOSPITAL_COMMUNITY): Payer: Self-pay

## 2012-09-28 HISTORY — DX: Major depressive disorder, single episode, unspecified: F32.9

## 2012-09-28 HISTORY — DX: Depression, unspecified: F32.A

## 2012-09-28 LAB — URINALYSIS, ROUTINE W REFLEX MICROSCOPIC
Leukocytes, UA: NEGATIVE
Nitrite: NEGATIVE
Specific Gravity, Urine: 1.009 (ref 1.005–1.030)
pH: 5 (ref 5.0–8.0)

## 2012-09-28 LAB — PROTIME-INR: INR: 1.12 (ref 0.00–1.49)

## 2012-09-28 NOTE — Progress Notes (Addendum)
Pt denies SOB and chest pain. Pt states that he had a stress test 6 months ago at [facility]; results requested. Pt unsure if he had an echo done and denies having a cardiac cath. Pt states that Dr. Katrinka Blazing is his cardiologist of [facility] and states that Dr. Johney Frame of [place] is responsible for the care of his ICD/Pacer. Pt states the Medtronic provides service to the device.Darel Hong of Dr. Candie Chroman office made aware of pt C/O an intact fluid filled blister 1 inch x 0.5 inch to the LLE at the end of a scabbed incision site. Pt advised not to rupture blister as planned. Pt made aware to Stop taking Aspirin, Coumadin and herbal medications. Do not take any NSAIDs ie: Ibuprofen, Advil, Naproxen or any medication containing Aspirin. ICD prescription orders were faxed to [facility] Pt states that he had a sleep study 10 years ago but he no longer has sleep apnea nor does he use a CPAP.Pt chart for review by Revonda Standard, PA (anesthesia) for cardiac history (ICD/pacemaker).

## 2012-09-28 NOTE — Pre-Procedure Instructions (Signed)
CAINE BARFIELD  09/28/2012   Your procedure is scheduled on: Wednesday September 30, 2012  Report to [facility] at 6:30 AM.  Call this number if you have problems the morning of surgery: [phone]   Remember:   Do not eat food or drink liquids after midnight.   Take these medicines the morning of surgery with A SIP OF WATER: carvedilol (COREG) 6.25 MG tablet ,finasteride (PROSCAR) 5 MG tablet, gabapentin (NEURONTIN) 300 MG capsule if needed:  NITROSTAT 0.4 MG SL tablet for chest pain, oxyCODONE-acetaminophen (PERCOCET/ROXICET) 5-325 MG per tablet  for pain .Stop taking Aspirin, Coumadin, and herbal medications. Do not take any NSAIDs ie: Ibuprofen, Advil, Naproxen or any medication containing Aspirin.   Do not wear jewelry, make-up or nail polish.  Do not wear lotions, powders, or perfumes. You may wear deodorant.   Do not shave 48 hours prior to surgery. Men may shave face and neck.  Do not bring valuables to the hospital.  [facility] is not responsible  for any belongings or valuables.  Contacts, dentures or bridgework may not be worn into surgery.  Leave suitcase in the car. After surgery it may be brought to your room.  For patients admitted to the hospital, checkout time is 11:00 AM the day of discharge.   Patients discharged the day of surgery will not be allowed to drive home.  Name and phone number of your driver:   Special Instructions: Shower using CHG 2 nights before surgery and the night before surgery.  If you shower the day of surgery use CHG.  Use special wash - you have one bottle of CHG for all showers.  You should use approximately 1/3 of the bottle for each shower.   Please read over the following fact sheets that you were given: Pain Booklet, Coughing and Deep Breathing, Blood Transfusion Information and Surgical Site Infection Prevention

## 2012-09-29 ENCOUNTER — Encounter (HOSPITAL_COMMUNITY): Payer: Self-pay

## 2012-09-29 MED ORDER — DEXTROSE 5 % IV SOLN
1.5000 g | INTRAVENOUS | Status: AC
  Administered 2012-09-30: 1.5 g via INTRAVENOUS
  Filled 2012-09-29: qty 1.5

## 2012-09-29 NOTE — Progress Notes (Signed)
Anesthesia Chart Review: Patient is a 78 year old male scheduled for left F-AT BPG with left arm vein and Gortex on 09/30/12 by Dr. Hart Rochester. He is s/p multiple LLE bypass procedures since 09/25/11 with the last being a left FPBG with Gortex on 09/16/12 for early occlusion of previous LLE bypass graft.  Other history includes CAD s/p mid LAD stent 10/13/06, afib, mixed ischemia/non-ischemic CM, chronic systolic CHF s/p Medtronic BiV ICD 12/25/09, left BBB, HLD, DM2, HTN, PAD with LLE occlusive disease and LUE claudication symptoms secondary to left mid-distal SCA occlusion by duplex 08/2012), CKD and right renal artery stenosis s/p PTA/stent 10/27/06, OSA (failed CPAP) s/p UPPP, ED, BPH gout, depression. He is listed as a non-smoker. PCP is listed as Dr. Gershon Crane.  Primary Cardiologist is Dr. Katrinka Blazing [facility]). EP Cardiologist is Dr. Johney Frame [facility]).   Stress test Deboraha Sprang) on 09/13/11 showed normal myocardial perfusion, no ischemia, EF 64%.    Echo on 11/14/09 showed moderate LV dilatation, EF 25-30%, moderate concentric LVH, moderate LAE, mild to moderate MR, AV sclerotic but opens well, mild RV dilatation, mild TR.  EKG on 07/08/12 showed v-paced rhythm.   Cardiac cath on 10/02/06 showed:  1. Severe coronary artery disease with 95-99% mid left anterior descending stenosis. Proximal left anterior descending and left main calcification. There is also severe disease in the nondominant right coronary. The circumflex has moderate disease, but for the most part is widely patent.  2. Global left ventricular dysfunction with ejection fraction in the 40% range.  3. Severe right renal artery stenosis, approximately 90% demonstrated by selective injection.  4. Hypertension.  He subsequently underwent a LAD and right RA stent in 2008.   CXR on 04/01/12 showed 08/29/11 showed no acute cardiopulmonary abnormalities, atherosclerosis in the thoracic aorta, left sided AICD.   Labs from 09/17/12 and 09/28/12 noted.  Cr BUN/Cr  33/2.0 which appear at his baseline.  LFTs were WNL 09/15/12.  H/H 11.7/34.1.  PLT 121K.  PT/PTT WNL yesterday.    ICD peri-operative device form is still pending from [facility].  He has tolerated multiple vascular procedures over the past year, last just a few weeks ago.  If no acute change in his cardiopulmonary status then I would anticipate that he could proceed as planned.  Velna Ochs [facility]/Anesthesiology Phone [phone] 09/29/2012 10:21 AM

## 2012-09-30 ENCOUNTER — Encounter (HOSPITAL_COMMUNITY)
Admission: RE | Disposition: A | Discharge status: Home / Self Care | Payer: Self-pay | Source: Ambulatory Visit | Attending: Vascular Surgery

## 2012-09-30 ENCOUNTER — Ambulatory Visit (HOSPITAL_COMMUNITY): Payer: Medicare Other | Admitting: Anesthesiology

## 2012-09-30 ENCOUNTER — Encounter (HOSPITAL_COMMUNITY): Payer: Self-pay | Admitting: Vascular Surgery

## 2012-09-30 ENCOUNTER — Inpatient Hospital Stay (HOSPITAL_COMMUNITY)
Admission: RE | Admit: 2012-09-30 | Discharge: 2012-10-06 | DRG: 253 | Disposition: A | Discharge status: Home / Self Care | Payer: Medicare Other | Source: Ambulatory Visit | Attending: Vascular Surgery | Admitting: Vascular Surgery

## 2012-09-30 ENCOUNTER — Encounter (HOSPITAL_COMMUNITY): Payer: Self-pay | Admitting: Surgery

## 2012-09-30 DIAGNOSIS — M129 Arthropathy, unspecified: Secondary | ICD-10-CM | POA: Diagnosis present

## 2012-09-30 DIAGNOSIS — L8995 Pressure ulcer of unspecified site, unstageable: Secondary | ICD-10-CM | POA: Diagnosis present

## 2012-09-30 DIAGNOSIS — I2510 Atherosclerotic heart disease of native coronary artery without angina pectoris: Secondary | ICD-10-CM | POA: Diagnosis present

## 2012-09-30 DIAGNOSIS — I5022 Chronic systolic (congestive) heart failure: Secondary | ICD-10-CM | POA: Diagnosis present

## 2012-09-30 DIAGNOSIS — I70229 Atherosclerosis of native arteries of extremities with rest pain, unspecified extremity: Secondary | ICD-10-CM | POA: Diagnosis present

## 2012-09-30 DIAGNOSIS — I129 Hypertensive chronic kidney disease with stage 1 through stage 4 chronic kidney disease, or unspecified chronic kidney disease: Secondary | ICD-10-CM | POA: Diagnosis present

## 2012-09-30 DIAGNOSIS — I70409 Unspecified atherosclerosis of autologous vein bypass graft(s) of the extremities, unspecified extremity: Principal | ICD-10-CM | POA: Diagnosis present

## 2012-09-30 DIAGNOSIS — Z95810 Presence of automatic (implantable) cardiac defibrillator: Secondary | ICD-10-CM

## 2012-09-30 DIAGNOSIS — R32 Unspecified urinary incontinence: Secondary | ICD-10-CM | POA: Diagnosis not present

## 2012-09-30 DIAGNOSIS — Z79899 Other long term (current) drug therapy: Secondary | ICD-10-CM

## 2012-09-30 DIAGNOSIS — L89609 Pressure ulcer of unspecified heel, unspecified stage: Secondary | ICD-10-CM | POA: Diagnosis present

## 2012-09-30 DIAGNOSIS — I872 Venous insufficiency (chronic) (peripheral): Secondary | ICD-10-CM | POA: Diagnosis present

## 2012-09-30 DIAGNOSIS — Z7901 Long term (current) use of anticoagulants: Secondary | ICD-10-CM

## 2012-09-30 DIAGNOSIS — I771 Stricture of artery: Secondary | ICD-10-CM | POA: Diagnosis present

## 2012-09-30 DIAGNOSIS — Z794 Long term (current) use of insulin: Secondary | ICD-10-CM

## 2012-09-30 DIAGNOSIS — L259 Unspecified contact dermatitis, unspecified cause: Secondary | ICD-10-CM | POA: Diagnosis present

## 2012-09-30 DIAGNOSIS — T82898A Other specified complication of vascular prosthetic devices, implants and grafts, initial encounter: Secondary | ICD-10-CM

## 2012-09-30 DIAGNOSIS — Z01812 Encounter for preprocedural laboratory examination: Secondary | ICD-10-CM

## 2012-09-30 DIAGNOSIS — M109 Gout, unspecified: Secondary | ICD-10-CM | POA: Diagnosis present

## 2012-09-30 DIAGNOSIS — I4891 Unspecified atrial fibrillation: Secondary | ICD-10-CM | POA: Diagnosis present

## 2012-09-30 DIAGNOSIS — M79609 Pain in unspecified limb: Secondary | ICD-10-CM

## 2012-09-30 DIAGNOSIS — I509 Heart failure, unspecified: Secondary | ICD-10-CM | POA: Diagnosis present

## 2012-09-30 DIAGNOSIS — I70219 Atherosclerosis of native arteries of extremities with intermittent claudication, unspecified extremity: Secondary | ICD-10-CM

## 2012-09-30 DIAGNOSIS — I2589 Other forms of chronic ischemic heart disease: Secondary | ICD-10-CM | POA: Diagnosis present

## 2012-09-30 DIAGNOSIS — E1059 Type 1 diabetes mellitus with other circulatory complications: Secondary | ICD-10-CM | POA: Diagnosis present

## 2012-09-30 DIAGNOSIS — I871 Compression of vein: Secondary | ICD-10-CM | POA: Diagnosis present

## 2012-09-30 DIAGNOSIS — N400 Enlarged prostate without lower urinary tract symptoms: Secondary | ICD-10-CM | POA: Diagnosis present

## 2012-09-30 DIAGNOSIS — E785 Hyperlipidemia, unspecified: Secondary | ICD-10-CM | POA: Diagnosis present

## 2012-09-30 DIAGNOSIS — D62 Acute posthemorrhagic anemia: Secondary | ICD-10-CM | POA: Diagnosis not present

## 2012-09-30 DIAGNOSIS — E875 Hyperkalemia: Secondary | ICD-10-CM | POA: Diagnosis not present

## 2012-09-30 DIAGNOSIS — N184 Chronic kidney disease, stage 4 (severe): Secondary | ICD-10-CM | POA: Diagnosis present

## 2012-09-30 DIAGNOSIS — F29 Unspecified psychosis not due to a substance or known physiological condition: Secondary | ICD-10-CM | POA: Diagnosis not present

## 2012-09-30 DIAGNOSIS — G609 Hereditary and idiopathic neuropathy, unspecified: Secondary | ICD-10-CM | POA: Diagnosis present

## 2012-09-30 DIAGNOSIS — R509 Fever, unspecified: Secondary | ICD-10-CM | POA: Diagnosis not present

## 2012-09-30 HISTORY — PX: FEMORAL-TIBIAL BYPASS GRAFT: SHX938

## 2012-09-30 LAB — CBC
HCT: 31.7 % — ABNORMAL LOW (ref 39.0–52.0)
Hemoglobin: 10.4 g/dL — ABNORMAL LOW (ref 13.0–17.0)
MCH: 27.7 pg (ref 26.0–34.0)
MCHC: 32.8 g/dL (ref 30.0–36.0)
RDW: 14.9 % (ref 11.5–15.5)

## 2012-09-30 LAB — POCT I-STAT, CHEM 8
Creatinine, Ser: 2.7 mg/dL — ABNORMAL HIGH (ref 0.50–1.35)
Hemoglobin: 11.9 g/dL — ABNORMAL LOW (ref 13.0–17.0)
Sodium: 142 mEq/L (ref 135–145)
TCO2: 25 mmol/L (ref 0–100)

## 2012-09-30 LAB — GLUCOSE, CAPILLARY: Glucose-Capillary: 141 mg/dL — ABNORMAL HIGH (ref 70–99)

## 2012-09-30 SURGERY — CREATION, BYPASS, ARTERIAL, FEMORAL TO TIBIAL, USING GRAFT
Anesthesia: General | Site: Leg Lower | Laterality: Left | Wound class: Clean

## 2012-09-30 MED ORDER — GLYCOPYRROLATE 0.2 MG/ML IJ SOLN
INTRAMUSCULAR | Status: DC | PRN
  Administered 2012-09-30: .6 mg via INTRAVENOUS

## 2012-09-30 MED ORDER — ONDANSETRON HCL 4 MG/2ML IJ SOLN: 4.0000 mg | Freq: Four times a day (QID) | INTRAMUSCULAR | Status: DC | PRN

## 2012-09-30 MED ORDER — NEOSTIGMINE METHYLSULFATE 1 MG/ML IJ SOLN
INTRAMUSCULAR | Status: DC | PRN
  Administered 2012-09-30: 3 mg via INTRAVENOUS

## 2012-09-30 MED ORDER — OXYCODONE-ACETAMINOPHEN 5-325 MG PO TABS
1.0000 | ORAL_TABLET | ORAL | Status: DC | PRN
  Administered 2012-10-01 (×3): 1 via ORAL
  Administered 2012-10-02 – 2012-10-04 (×8): 2 via ORAL
  Filled 2012-09-30: qty 2
  Filled 2012-09-30: qty 1
  Filled 2012-09-30: qty 2
  Filled 2012-09-30: qty 1
  Filled 2012-09-30 (×6): qty 2
  Filled 2012-09-30: qty 1

## 2012-09-30 MED ORDER — MORPHINE SULFATE 2 MG/ML IJ SOLN
INTRAMUSCULAR | Status: AC
  Administered 2012-09-30: 2 mg via INTRAVENOUS
  Filled 2012-09-30: qty 1

## 2012-09-30 MED ORDER — PHENYLEPHRINE HCL 10 MG/ML IJ SOLN
30.0000 ug/min | INTRAVENOUS | Status: DC
  Filled 2012-09-30: qty 1

## 2012-09-30 MED ORDER — FUROSEMIDE 40 MG PO TABS
40.0000 mg | ORAL_TABLET | Freq: Every day | ORAL | Status: DC
  Administered 2012-10-01 – 2012-10-04 (×3): 40 mg via ORAL
  Filled 2012-09-30 (×7): qty 1

## 2012-09-30 MED ORDER — DEXAMETHASONE SODIUM PHOSPHATE 10 MG/ML IJ SOLN
INTRAMUSCULAR | Status: DC | PRN
  Administered 2012-09-30: 4 mg via INTRAVENOUS

## 2012-09-30 MED ORDER — OXYCODONE HCL 5 MG PO TABS
5.0000 mg | ORAL_TABLET | Freq: Once | ORAL | Status: DC | PRN
Start: 2012-09-30 — End: 2012-09-30

## 2012-09-30 MED ORDER — METOCLOPRAMIDE HCL 5 MG/ML IJ SOLN
INTRAMUSCULAR | Status: DC | PRN
  Administered 2012-09-30: 10 mg via INTRAVENOUS

## 2012-09-30 MED ORDER — SODIUM CHLORIDE 0.9 % IV SOLN: INTRAVENOUS | Status: DC

## 2012-09-30 MED ORDER — MIDAZOLAM HCL 5 MG/5ML IJ SOLN
INTRAMUSCULAR | Status: DC | PRN
  Administered 2012-09-30: 1 mg via INTRAVENOUS

## 2012-09-30 MED ORDER — WARFARIN SODIUM 7.5 MG PO TABS
7.5000 mg | ORAL_TABLET | Freq: Once | ORAL | Status: AC
  Administered 2012-09-30: 7.5 mg via ORAL
  Filled 2012-09-30: qty 1

## 2012-09-30 MED ORDER — ZOLPIDEM TARTRATE 5 MG PO TABS: 5.0000 mg | ORAL_TABLET | Freq: Every evening | ORAL | Status: DC | PRN

## 2012-09-30 MED ORDER — LOSARTAN POTASSIUM 50 MG PO TABS
100.0000 mg | ORAL_TABLET | Freq: Every day | ORAL | Status: DC
  Administered 2012-09-30 – 2012-10-02 (×3): 100 mg via ORAL
  Filled 2012-09-30 (×3): qty 2

## 2012-09-30 MED ORDER — GABAPENTIN 300 MG PO CAPS
300.0000 mg | ORAL_CAPSULE | Freq: Three times a day (TID) | ORAL | Status: DC
  Administered 2012-09-30 – 2012-10-05 (×17): 300 mg via ORAL
  Filled 2012-09-30 (×21): qty 1

## 2012-09-30 MED ORDER — GUAIFENESIN-DM 100-10 MG/5ML PO SYRP: 15.0000 mL | ORAL_SOLUTION | ORAL | Status: DC | PRN

## 2012-09-30 MED ORDER — OXYCODONE HCL 5 MG/5ML PO SOLN: 5.0000 mg | Freq: Once | ORAL | Status: DC | PRN

## 2012-09-30 MED ORDER — DOPAMINE-DEXTROSE 3.2-5 MG/ML-% IV SOLN: 3.0000 ug/kg/min | INTRAVENOUS | Status: DC

## 2012-09-30 MED ORDER — ROCURONIUM BROMIDE 100 MG/10ML IV SOLN
INTRAVENOUS | Status: DC | PRN
  Administered 2012-09-30: 50 mg via INTRAVENOUS
  Administered 2012-09-30 (×6): 10 mg via INTRAVENOUS

## 2012-09-30 MED ORDER — LACTATED RINGERS IV SOLN
INTRAVENOUS | Status: DC | PRN
  Administered 2012-09-30 (×2): via INTRAVENOUS

## 2012-09-30 MED ORDER — HYDRALAZINE HCL 20 MG/ML IJ SOLN: 10.0000 mg | INTRAMUSCULAR | Status: DC | PRN

## 2012-09-30 MED ORDER — ONDANSETRON HCL 4 MG/2ML IJ SOLN
INTRAMUSCULAR | Status: DC | PRN
  Administered 2012-09-30: 4 mg via INTRAVENOUS

## 2012-09-30 MED ORDER — LABETALOL HCL 5 MG/ML IV SOLN
10.0000 mg | INTRAVENOUS | Status: DC | PRN
  Filled 2012-09-30: qty 4

## 2012-09-30 MED ORDER — SODIUM CHLORIDE 0.9 % IV SOLN
INTRAVENOUS | Status: DC
  Administered 2012-09-30: 17:00:00 via INTRAVENOUS

## 2012-09-30 MED ORDER — WARFARIN SODIUM 10 MG PO TABS: 10.0000 mg | ORAL_TABLET | Freq: Once | ORAL | Status: DC

## 2012-09-30 MED ORDER — PHENOL 1.4 % MT LIQD: 1.0000 | OROMUCOSAL | Status: DC | PRN

## 2012-09-30 MED ORDER — LOSARTAN POTASSIUM 50 MG PO TABS: 100.0000 mg | ORAL_TABLET | Freq: Every day | ORAL | Status: DC

## 2012-09-30 MED ORDER — DULOXETINE HCL 30 MG PO CPEP
30.0000 mg | ORAL_CAPSULE | Freq: Every day | ORAL | Status: DC
  Administered 2012-09-30 – 2012-10-02 (×2): 30 mg via ORAL
  Filled 2012-09-30 (×7): qty 1

## 2012-09-30 MED ORDER — SODIUM CHLORIDE 0.9 % IV SOLN: 500.0000 mL | Freq: Once | INTRAVENOUS | Status: AC | PRN

## 2012-09-30 MED ORDER — METOPROLOL TARTRATE 1 MG/ML IV SOLN
2.0000 mg | INTRAVENOUS | Status: DC | PRN
  Administered 2012-10-03: 5 mg via INTRAVENOUS
  Filled 2012-09-30: qty 5

## 2012-09-30 MED ORDER — PROPOFOL 10 MG/ML IV BOLUS
INTRAVENOUS | Status: DC | PRN
  Administered 2012-09-30: 150 mg via INTRAVENOUS

## 2012-09-30 MED ORDER — HYDROMORPHONE HCL PF 1 MG/ML IJ SOLN
0.2500 mg | INTRAMUSCULAR | Status: DC | PRN
  Administered 2012-09-30 (×2): 0.5 mg via INTRAVENOUS

## 2012-09-30 MED ORDER — HEPARIN SODIUM (PORCINE) 1000 UNIT/ML IJ SOLN
INTRAMUSCULAR | Status: DC | PRN
  Administered 2012-09-30: 2000 [IU] via INTRAVENOUS
  Administered 2012-09-30: 6000 [IU] via INTRAVENOUS

## 2012-09-30 MED ORDER — ALBUMIN HUMAN 5 % IV SOLN
INTRAVENOUS | Status: DC | PRN
  Administered 2012-09-30: 10:00:00 via INTRAVENOUS

## 2012-09-30 MED ORDER — INSULIN ASPART 100 UNIT/ML ~~LOC~~ SOLN
0.0000 [IU] | Freq: Three times a day (TID) | SUBCUTANEOUS | Status: DC
  Administered 2012-10-01: 2 [IU] via SUBCUTANEOUS
  Administered 2012-10-01: 5 [IU] via SUBCUTANEOUS
  Administered 2012-10-02: 2 [IU] via SUBCUTANEOUS
  Administered 2012-10-04 (×2): 3 [IU] via SUBCUTANEOUS
  Administered 2012-10-05: 2 [IU] via SUBCUTANEOUS
  Administered 2012-10-05: 3 [IU] via SUBCUTANEOUS

## 2012-09-30 MED ORDER — ARTIFICIAL TEARS OP OINT
TOPICAL_OINTMENT | OPHTHALMIC | Status: DC | PRN
  Administered 2012-09-30: 1 via OPHTHALMIC

## 2012-09-30 MED ORDER — MORPHINE SULFATE 2 MG/ML IJ SOLN
2.0000 mg | INTRAMUSCULAR | Status: DC | PRN
  Administered 2012-09-30 – 2012-10-01 (×4): 4 mg via INTRAVENOUS
  Administered 2012-10-03: 2 mg via INTRAVENOUS
  Filled 2012-09-30: qty 1
  Filled 2012-09-30 (×2): qty 2
  Filled 2012-09-30: qty 1
  Filled 2012-09-30: qty 2
  Filled 2012-09-30: qty 1

## 2012-09-30 MED ORDER — CHLORHEXIDINE GLUCONATE CLOTH 2 % EX PADS
6.0000 | MEDICATED_PAD | Freq: Every day | CUTANEOUS | Status: AC
  Administered 2012-09-30 – 2012-10-04 (×4): 6 via TOPICAL

## 2012-09-30 MED ORDER — CEFUROXIME SODIUM 1.5 G IJ SOLR
1.5000 g | Freq: Two times a day (BID) | INTRAMUSCULAR | Status: AC
Start: 2012-09-30 — End: 2012-10-01
  Administered 2012-09-30 – 2012-10-01 (×2): 1.5 g via INTRAVENOUS
  Filled 2012-09-30 (×3): qty 1.5

## 2012-09-30 MED ORDER — SODIUM CHLORIDE 0.9 % IR SOLN
Status: DC | PRN
  Administered 2012-09-30: 10:00:00

## 2012-09-30 MED ORDER — FENTANYL CITRATE 0.05 MG/ML IJ SOLN
INTRAMUSCULAR | Status: DC | PRN
  Administered 2012-09-30: 50 ug via INTRAVENOUS
  Administered 2012-09-30: 150 ug via INTRAVENOUS
  Administered 2012-09-30: 50 ug via INTRAVENOUS
  Administered 2012-09-30: 100 ug via INTRAVENOUS
  Administered 2012-09-30 (×3): 50 ug via INTRAVENOUS

## 2012-09-30 MED ORDER — HEPARIN (PORCINE) IN NACL 100-0.45 UNIT/ML-% IJ SOLN
800.0000 [IU]/h | INTRAMUSCULAR | Status: DC
  Administered 2012-09-30: 600 [IU]/h via INTRAVENOUS
  Administered 2012-10-02 – 2012-10-04 (×2): 800 [IU]/h via INTRAVENOUS
  Filled 2012-09-30 (×7): qty 250

## 2012-09-30 MED ORDER — PANTOPRAZOLE SODIUM 40 MG PO TBEC
40.0000 mg | DELAYED_RELEASE_TABLET | Freq: Every day | ORAL | Status: DC
  Administered 2012-10-01 – 2012-10-05 (×3): 40 mg via ORAL
  Filled 2012-09-30 (×4): qty 1

## 2012-09-30 MED ORDER — ACETAMINOPHEN 325 MG PO TABS
325.0000 mg | ORAL_TABLET | ORAL | Status: DC | PRN
  Administered 2012-10-03 – 2012-10-06 (×3): 650 mg via ORAL
  Filled 2012-09-30 (×3): qty 2

## 2012-09-30 MED ORDER — DOCUSATE SODIUM 100 MG PO CAPS
100.0000 mg | ORAL_CAPSULE | Freq: Every day | ORAL | Status: DC
  Administered 2012-10-01 – 2012-10-05 (×5): 100 mg via ORAL
  Filled 2012-09-30 (×6): qty 1

## 2012-09-30 MED ORDER — MUPIROCIN 2 % EX OINT
1.0000 "application " | TOPICAL_OINTMENT | Freq: Two times a day (BID) | CUTANEOUS | Status: AC
  Administered 2012-09-30 – 2012-10-05 (×10): 1 via NASAL
  Filled 2012-09-30 (×3): qty 22

## 2012-09-30 MED ORDER — HYDROMORPHONE HCL PF 1 MG/ML IJ SOLN
INTRAMUSCULAR | Status: AC
  Filled 2012-09-30: qty 1

## 2012-09-30 MED ORDER — CARVEDILOL 6.25 MG PO TABS
6.2500 mg | ORAL_TABLET | Freq: Two times a day (BID) | ORAL | Status: DC
  Administered 2012-09-30 – 2012-10-06 (×12): 6.25 mg via ORAL
  Filled 2012-09-30 (×15): qty 1

## 2012-09-30 MED ORDER — FINASTERIDE 5 MG PO TABS
5.0000 mg | ORAL_TABLET | Freq: Every day | ORAL | Status: DC
  Administered 2012-10-01 – 2012-10-05 (×5): 5 mg via ORAL
  Filled 2012-09-30 (×6): qty 1

## 2012-09-30 MED ORDER — NITROGLYCERIN 0.4 MG SL SUBL: 0.4000 mg | SUBLINGUAL_TABLET | SUBLINGUAL | Status: DC | PRN

## 2012-09-30 MED ORDER — ACETAMINOPHEN 650 MG RE SUPP: 325.0000 mg | RECTAL | Status: DC | PRN

## 2012-09-30 MED ORDER — PHENYLEPHRINE HCL 10 MG/ML IJ SOLN
10.0000 mg | INTRAVENOUS | Status: DC | PRN
  Administered 2012-09-30: 10 ug/min via INTRAVENOUS

## 2012-09-30 MED ORDER — 0.9 % SODIUM CHLORIDE (POUR BTL) OPTIME
TOPICAL | Status: DC | PRN
  Administered 2012-09-30 (×2): 1000 mL

## 2012-09-30 MED ORDER — LIDOCAINE HCL (CARDIAC) 20 MG/ML IV SOLN
INTRAVENOUS | Status: DC | PRN
  Administered 2012-09-30: 70 mg via INTRAVENOUS

## 2012-09-30 MED ORDER — WARFARIN - PHARMACIST DOSING INPATIENT
Freq: Every day | Status: DC
  Administered 2012-10-02: 19:00:00

## 2012-09-30 MED ORDER — METOCLOPRAMIDE HCL 5 MG/ML IJ SOLN: 10.0000 mg | Freq: Once | INTRAMUSCULAR | Status: DC | PRN

## 2012-09-30 MED ORDER — THROMBIN 20000 UNITS EX SOLR
CUTANEOUS | Status: AC
  Filled 2012-09-30: qty 20000

## 2012-09-30 SURGICAL SUPPLY — 72 items
ADH SKN CLS APL DERMABOND .7 (GAUZE/BANDAGES/DRESSINGS) ×10
BANDAGE ESMARK 6X9 LF (GAUZE/BANDAGES/DRESSINGS) IMPLANT
BANDAGE GAUZE ELAST BULKY 4 IN (GAUZE/BANDAGES/DRESSINGS) ×1 IMPLANT
BNDG CMPR 9X6 STRL LF SNTH (GAUZE/BANDAGES/DRESSINGS)
BNDG ESMARK 6X9 LF (GAUZE/BANDAGES/DRESSINGS)
BOOT SUTURE AID YELLOW STND (SUTURE) IMPLANT
CANISTER SUCTION 2500CC (MISCELLANEOUS) ×2 IMPLANT
CATH EMB 3FR 80CM (CATHETERS) ×1 IMPLANT
CLIP LIGATING EXTRA MED SLVR (CLIP) ×1 IMPLANT
CLIP LIGATING EXTRA SM BLUE (MISCELLANEOUS) ×1 IMPLANT
CLIP TI MEDIUM 24 (CLIP) ×2 IMPLANT
CLIP TI WIDE RED SMALL 24 (CLIP) ×3 IMPLANT
CLOTH BEACON ORANGE TIMEOUT ST (SAFETY) ×2 IMPLANT
COVER SURGICAL LIGHT HANDLE (MISCELLANEOUS) ×2 IMPLANT
CUFF TOURNIQUET SINGLE 24IN (TOURNIQUET CUFF) IMPLANT
CUFF TOURNIQUET SINGLE 34IN LL (TOURNIQUET CUFF) IMPLANT
DERMABOND ADVANCED (GAUZE/BANDAGES/DRESSINGS) ×10
DERMABOND ADVANCED .7 DNX12 (GAUZE/BANDAGES/DRESSINGS) ×1 IMPLANT
DRAIN SNY 10X20 3/4 PERF (WOUND CARE) IMPLANT
DRAPE TABLE COVER HEAVY DUTY (DRAPES) ×1 IMPLANT
DRAPE WARM FLUID 44X44 (DRAPE) ×2 IMPLANT
DRAPE X-RAY CASS 24X20 (DRAPES) IMPLANT
ELECT REM PT RETURN 9FT ADLT (ELECTROSURGICAL) ×2
ELECTRODE REM PT RTRN 9FT ADLT (ELECTROSURGICAL) ×1 IMPLANT
EVACUATOR SILICONE 100CC (DRAIN) IMPLANT
GAUZE SPONGE 4X4 16PLY XRAY LF (GAUZE/BANDAGES/DRESSINGS) ×1 IMPLANT
GLOVE BIO SURGEON STRL SZ 6.5 (GLOVE) ×2 IMPLANT
GLOVE BIOGEL PI IND STRL 7.5 (GLOVE) IMPLANT
GLOVE BIOGEL PI INDICATOR 7.5 (GLOVE) ×1
GLOVE ECLIPSE 6.5 STRL STRAW (GLOVE) ×1 IMPLANT
GLOVE ECLIPSE 7.5 STRL STRAW (GLOVE) ×3 IMPLANT
GLOVE SS BIOGEL STRL SZ 6.5 (GLOVE) IMPLANT
GLOVE SS BIOGEL STRL SZ 7 (GLOVE) ×1 IMPLANT
GLOVE SS BIOGEL STRL SZ 7.5 (GLOVE) IMPLANT
GLOVE SUPERSENSE BIOGEL SZ 6.5 (GLOVE) ×1
GLOVE SUPERSENSE BIOGEL SZ 7 (GLOVE) ×1
GLOVE SUPERSENSE BIOGEL SZ 7.5 (GLOVE) ×1
GOWN STRL NON-REIN LRG LVL3 (GOWN DISPOSABLE) ×9 IMPLANT
INSERT FOGARTY SM (MISCELLANEOUS) ×1 IMPLANT
KIT BASIN OR (CUSTOM PROCEDURE TRAY) ×2 IMPLANT
KIT ROOM TURNOVER OR (KITS) ×2 IMPLANT
NS IRRIG 1000ML POUR BTL (IV SOLUTION) ×4 IMPLANT
PACK PERIPHERAL VASCULAR (CUSTOM PROCEDURE TRAY) ×2 IMPLANT
PAD ARMBOARD 7.5X6 YLW CONV (MISCELLANEOUS) ×4 IMPLANT
PAD SHARPS MAGNETIC DISPOSAL (MISCELLANEOUS) ×1 IMPLANT
PADDING CAST COTTON 6X4 STRL (CAST SUPPLIES) IMPLANT
SET COLLECT BLD 21X3/4 12 (NEEDLE) IMPLANT
SPONGE GAUZE 4X4 12PLY (GAUZE/BANDAGES/DRESSINGS) ×1 IMPLANT
SPONGE LAP 18X18 X RAY DECT (DISPOSABLE) ×2 IMPLANT
STOPCOCK 4 WAY LG BORE MALE ST (IV SETS) IMPLANT
SUT PROLENE 5 0 C 1 24 (SUTURE) ×3 IMPLANT
SUT PROLENE 6 0 BV (SUTURE) ×9 IMPLANT
SUT PROLENE 6 0 C 1 24 (SUTURE) ×1 IMPLANT
SUT PROLENE 6 0 CC (SUTURE) IMPLANT
SUT PROLENE 7 0 BV 1 (SUTURE) ×4 IMPLANT
SUT PROLENE 7 0 BV1 MDA (SUTURE) IMPLANT
SUT SILK 2 0 SH (SUTURE) ×2 IMPLANT
SUT SILK 3 0 (SUTURE)
SUT SILK 3-0 18XBRD TIE 12 (SUTURE) IMPLANT
SUT SILK 4 0 TIES 17X18 (SUTURE) ×2 IMPLANT
SUT VIC AB 2-0 CTX 36 (SUTURE) ×3 IMPLANT
SUT VIC AB 3-0 SH 27 (SUTURE) ×22
SUT VIC AB 3-0 SH 27X BRD (SUTURE) ×2 IMPLANT
SUT VICRYL 4-0 PS2 18IN ABS (SUTURE) ×6 IMPLANT
SYR TB 1ML LUER SLIP (SYRINGE) ×1 IMPLANT
TAPE UMBILICAL COTTON 1/8X30 (MISCELLANEOUS) ×1 IMPLANT
TOWEL OR 17X24 6PK STRL BLUE (TOWEL DISPOSABLE) ×4 IMPLANT
TOWEL OR 17X26 10 PK STRL BLUE (TOWEL DISPOSABLE) ×4 IMPLANT
TRAY FOLEY CATH 14FRSI W/METER (CATHETERS) ×2 IMPLANT
TUBING EXTENTION W/L.L. (IV SETS) IMPLANT
UNDERPAD 30X30 INCONTINENT (UNDERPADS AND DIAPERS) ×2 IMPLANT
WATER STERILE IRR 1000ML POUR (IV SOLUTION) ×2 IMPLANT

## 2012-09-30 NOTE — Op Note (Signed)
OPERATIVE REPORT  Date of Surgery: 09/30/2012  Surgeon: [name], MD  Assistant: Dr. Tawanna Cooler early, Lelon Mast Rhyne,PA  Pre-op Diagnosis: Rest ischemia of left lower extremity secondary to severe superficial femoral popliteal and tibial occlusive disease with previous failed attempts at revascularization Post-op Diagnosis: Same  Procedure: Procedure(s): REDO LEFT FEMORAL-ANTERIOR TIBIAL ARTERY BYPASS USING COMPOSITE CEPHALIC AND BASILIC VEIN GRAFT FROM LEFT ARM  Anesthesia: General  EBL: 200 cc  Complications: None  The patient was taken to the operating room placed in the supine position at which time satisfactory general endotracheal anesthesia was administered. Left upper Trinity and left lower extremity were prepped with Betadine scrub and solution draped in routine sterile manner. The plan was to perform and all autogenous vein graft to the mid anterior tibial artery on the left if there was suitable vein in the left arm. Cephalic vein was harvested from the wrist to the shoulder through multiple incisions leaving skin bridges. Branches were ligated with 4-0 silk ties and transected. The vein was carefully dilated after removing it and gently dilated with heparinized saline was very marginal in the forearm particularly. There was a segment in the upper arm which was not excellent eater. The most proximal portion of the vein was satisfactory. Following this basilic vein was then removed from the distal forearm to the proximal upper arm through multiple incisions along the medial aspect of the left arm. Its branches were similarly ligated with 3 and 4-0 silk ties and divided it was gently dilated with heparinized saline marked for orientation purposes. It was a much better vein was utilized completely does look well as a piece of the more proximal cephalic vein. The veins were spatulated and anastomosed end to end with 2 6-0 Prolene sutures. Following this the previous incision in the left  inguinal area was re\re incised carried down to scar tissue and the common superficial and profunda femoris artery dissected free as well as the recent Gore-Tex femoral-popliteal graft which is anastomosed to the common femoral artery. Medial incision was made between the knee and lateral malleolus and anterior tibial artery was exposed just distal to the previous bypass was which was in the proximal third of the leg. The vessel was a 2-1/2-3 mm vessel but did have mild diffuse disease. Subcutaneous lateral tunnel was then created patient was heparinized. The vein was used in a non-reversed fashion in the femoral artery and its branches were occluded with vascular clamps. The previous Gore-Tex graft was excised from the common femoral artery there was excellent flow. The most proximal portion of the basilic vein was used for the femoral anastomosis as it was the largest vein. The spatulated and anastomosed end to side with 6-0 Prolene. Clamps released there was good pulse in the first competent valves. Using the retrograde valvotome the valves were rendered incompetent. Is not happy with the vein to vein anastomosis. Therefore we did this on 2 occasions until same or satisfactory. The problem was at the vein was very thin and of small caliber. Following this the valves were rendered incompetent and there was satisfactory flow in the distal end of the vein graft given the small caliber of the distal portion. It was carefully delivered through the tunnel anterior tibial artery occluded proximally and distally with Vesseloops a 15 blade extended with Potts scissors. It would accept a 2-1/2 and 3 mm dilator distally the Fogarty catheter would go down to the dorsum of the foot. Pain was carefully measured spatulated and anastomosed end to side  with 6-0 Prolene. Following this Vesseloops released there was good Doppler flow and a good palpable pulse in the vein graft subcutaneously as well as good anterior tibial flow at  the ankle. Intraoperative arteriogram was not performed because the patient's renal insufficiency with elevated creatinine. No protamine was given adequate hemostasis achieved the wounds are closed in layers of Vicryl in subcuticular fashion with Dermabond patient taken to recovery in stable condition   Procedure Details:   [name], MD 09/30/2012 2:10 PM

## 2012-09-30 NOTE — Transfer of Care (Signed)
Immediate Anesthesia Transfer of Care Note  Patient: [name]  Procedure(s) Performed: Procedure(s): REDO LEFT FEMORAL-ANTERIOR TIBIAL ARTERY BYPASS USING COMPOSITE CEPHALIC AND BASILIC VEIN GRAFT FROM LEFT ARM (Left)  Patient Location: PACU  Anesthesia Type:General  Level of Consciousness: awake, alert  and oriented  Airway & Oxygen Therapy: Patient Spontanous Breathing and Patient connected to nasal cannula oxygen  Post-op Assessment: Report given to PACU RN and Post -op Vital signs reviewed and stable  Post vital signs: Reviewed and stable  Complications: No apparent anesthesia complications

## 2012-09-30 NOTE — Interval H&P Note (Signed)
History and Physical Interval Note:  09/30/2012 8:24 AM  [name]  has presented today for surgery, with the diagnosis of Left femoral - tibial vein graft stenosis with rest pain left foot  The various methods of treatment have been discussed with the patient and family. After consideration of risks, benefits and other options for treatment, the patient has consented to  Procedure(s): BYPASS GRAFT LEFT FEMORAL- ANT TIBIAL ARTERY BYPASS WITH VEIN LEFT ARM AND GORTEX  (Left) as a surgical intervention .  The patient's history has been reviewed, patient examined, no change in status, stable for surgery.  I have reviewed the patient's chart and labs.  Questions were answered to the patient's satisfaction.     [name]

## 2012-09-30 NOTE — Anesthesia Procedure Notes (Signed)
Procedure Name: Intubation Date/Time: 09/30/2012 8:54 AM Performed by: Gwenyth Allegra Pre-anesthesia Checklist: Patient identified, Emergency Drugs available, Suction available, Patient being monitored and Timeout performed Patient Re-evaluated:Patient Re-evaluated prior to inductionOxygen Delivery Method: Circle system utilized Preoxygenation: Pre-oxygenation with 100% oxygen Intubation Type: IV induction Ventilation: Mask ventilation without difficulty and Oral airway inserted - appropriate to patient size Laryngoscope Size: [place] Pall and 3 Grade View: Grade I Tube type: Oral Tube size: 8.0 mm Number of attempts: 1 Airway Equipment and Method: Stylet Placement Confirmation: ETT inserted through vocal cords under direct vision,  positive ETCO2 and breath sounds checked- equal and bilateral Secured at: 22 cm Tube secured with: Tape Dental Injury: Teeth and Oropharynx as per pre-operative assessment

## 2012-09-30 NOTE — Preoperative (Signed)
Beta Blockers   Reason not to administer Beta Blockers:Not Applicable 

## 2012-09-30 NOTE — Anesthesia Preprocedure Evaluation (Signed)
Anesthesia Evaluation  Patient identified by MRN, date of birth, ID band Patient awake    Reviewed: Allergy & Precautions, H&P , NPO status , Patient's Chart, lab work & pertinent test results, reviewed documented beta blocker date and time   Airway Mallampati: II TM Distance: >3 FB Neck ROM: full    Dental   Pulmonary sleep apnea ,  breath sounds clear to auscultation        Cardiovascular hypertension, + CAD, + Cardiac Stents, + Peripheral Vascular Disease and +CHF + dysrhythmias + pacemaker + Cardiac Defibrillator Rhythm:regular     Neuro/Psych PSYCHIATRIC DISORDERS Depression  Neuromuscular disease    GI/Hepatic negative GI ROS, Neg liver ROS,   Endo/Other  diabetes, Insulin Dependent  Renal/GU Renal disease  negative genitourinary   Musculoskeletal   Abdominal   Peds  Hematology  (+) Blood dyscrasia, ,   Anesthesia Other Findings See surgeon's H&P   Reproductive/Obstetrics negative OB ROS                           Anesthesia Physical Anesthesia Plan  ASA: III  Anesthesia Plan: General   Post-op Pain Management:    Induction: Intravenous  Airway Management Planned: Oral ETT  Additional Equipment: Arterial line  Intra-op Plan:   Post-operative Plan: Extubation in OR  Informed Consent: I have reviewed the patients History and Physical, chart, labs and discussed the procedure including the risks, benefits and alternatives for the proposed anesthesia with the patient or authorized representative who has indicated his/her understanding and acceptance.   Dental Advisory Given  Plan Discussed with: CRNA and Surgeon  Anesthesia Plan Comments:         Anesthesia Quick Evaluation

## 2012-09-30 NOTE — H&P (View-Only) (Signed)
Subjective:     Patient ID: [name], male   DOB: [birth year], 78 y.o.   MRN: [id number]  HPI this 78 year old male returns today with known occlusion of his attempted left femoral popliteal Gore-Tex bypass last week. He has very poor veins for conduits and we attempted a below knee femoral-popliteal Gore-Tex graft into a very disadvantaged runoff situation. This was unsuccessful occluding about 36 hours later. His only option now is a bypass into one vessel runoff anterior tibial artery in the mid left lower leg. He is experiencing rest pain at night but has developed no infection or cellulitis.  Review of Systems     Objective:   Physical Exam BP 138/68  Pulse 73  Temp(Src) 97.9 F (36.6 C) (Oral)  Ht 5\' 8"  (1.727 m)  Wt 189 lb 9.6 oz (86.002 kg)  BMI 28.84 kg/m2  SpO2 100%  General well-developed well-nourished male in no apparent stress alert and oriented x3 Left leg with dependent rubor. 2-3+ femoral pulse palpable. Incision is healing satisfactorily.  There is audible flow in his left anterior tibial artery which was sluggish and monophasic.   I independently imaged his left arm veins the cephalic anteriorly and an apparent posterior forearm vein on the left. These are marginal in size and I do not think there would be adequate length to reach the distal vessel.      Assessment:     Severe femoral popliteal and tibial occlusive disease with rest ischemia and failed attempts at bypass because of poor vein conduits and poor runoff Only remaining option would be to attempt a femoral to distal anterior tibial bypass graft possibly using a composite Gore-Tex with vein from left arm area patient also has occluded left subclavian artery Concerned about Making Multiple Incisions in the Distal Left Upper Extremity with Potential Healing Problems Discussed All These Problems and Issues with the Patient and He Does Have an Understanding and Realizes He Is Rapidly Heading toward an  Amputation in the Left Leg    Plan:     Offered patient surgery this Friday but he would prefer to wait till the middle of next week so we'll schedule this for Wednesday, July 30 Plan left femoral to anterior tibial graft with either Gore-Tex or Gore-Tex vein composite. Will leave patient off of his Coumadin for chronic A. fib until postoperatively  Patient understands that the likelihood of success particularly long-term success is low but he would like to make one more attempt

## 2012-09-30 NOTE — Anesthesia Postprocedure Evaluation (Signed)
Anesthesia Post Note  Patient: [name]  Procedure(s) Performed: Procedure(s) (LRB): REDO LEFT FEMORAL-ANTERIOR TIBIAL ARTERY BYPASS USING COMPOSITE CEPHALIC AND BASILIC VEIN GRAFT FROM LEFT ARM (Left)  Anesthesia type: General  Patient location: PACU  Post pain: Pain level controlled  Post assessment: Patient's Cardiovascular Status Stable  Last Vitals:  Filed Vitals:   09/30/12 1634  BP:   Pulse:   Temp: 36.7 C  Resp:     Post vital signs: Reviewed and stable  Level of consciousness: alert  Complications: No apparent anesthesia complications.  ICD interrogated post-op.

## 2012-10-01 DIAGNOSIS — I70219 Atherosclerosis of native arteries of extremities with intermittent claudication, unspecified extremity: Secondary | ICD-10-CM

## 2012-10-01 LAB — GLUCOSE, CAPILLARY
Glucose-Capillary: 120 mg/dL — ABNORMAL HIGH (ref 70–99)
Glucose-Capillary: 135 mg/dL — ABNORMAL HIGH (ref 70–99)
Glucose-Capillary: 217 mg/dL — ABNORMAL HIGH (ref 70–99)

## 2012-10-01 LAB — CBC
HCT: 29.4 % — ABNORMAL LOW (ref 39.0–52.0)
Hemoglobin: 9.8 g/dL — ABNORMAL LOW (ref 13.0–17.0)
MCHC: 33.3 g/dL (ref 30.0–36.0)
MCV: 84 fL (ref 78.0–100.0)
RDW: 14.9 % (ref 11.5–15.5)

## 2012-10-01 LAB — BASIC METABOLIC PANEL
BUN: 51 mg/dL — ABNORMAL HIGH (ref 6–23)
Chloride: 107 mEq/L (ref 96–112)
Creatinine, Ser: 2.26 mg/dL — ABNORMAL HIGH (ref 0.50–1.35)
GFR calc Af Amer: 31 mL/min — ABNORMAL LOW (ref >90)
GFR calc non Af Amer: 26 mL/min — ABNORMAL LOW (ref >90)
Glucose, Bld: 117 mg/dL — ABNORMAL HIGH (ref 70–99)
Potassium: 5.4 mEq/L — ABNORMAL HIGH (ref 3.5–5.1)

## 2012-10-01 LAB — PROTIME-INR: INR: 1.32 (ref 0.00–1.49)

## 2012-10-01 MED ORDER — WARFARIN SODIUM 5 MG PO TABS
5.0000 mg | ORAL_TABLET | Freq: Once | ORAL | Status: AC
  Administered 2012-10-01: 5 mg via ORAL
  Filled 2012-10-01 (×2): qty 1

## 2012-10-01 MED ORDER — INSULIN ASPART PROT & ASPART (70-30 MIX) 100 UNIT/ML ~~LOC~~ SUSP
10.0000 [IU] | Freq: Two times a day (BID) | SUBCUTANEOUS | Status: DC
  Administered 2012-10-01 – 2012-10-03 (×4): 10 [IU] via SUBCUTANEOUS
  Filled 2012-10-01 (×3): qty 10

## 2012-10-01 NOTE — Progress Notes (Signed)
ANTICOAGULATION CONSULT NOTE - Follow Up Consult  Pharmacy Consult for Coumadin Indication: atrial fibrillation and prevention of femoral bypass graft occlusion  Allergies  Allergen Reactions  . Other Other (See Comments)    Plastic tape tears skin   Patient Measurements: Height: 5\' 8"  (172.7 cm) Weight: 188 lb 0.8 oz (85.3 kg) IBW/kg (Calculated) : 68.4  Vital Signs: Temp: 98.2 F (36.8 C) (07/31 0757) Temp src: Oral (07/31 0757) BP: 121/71 mmHg (07/31 0757) Pulse Rate: 69 (07/31 0330)  Labs:  Recent Labs  09/28/12 1500  09/30/12 0649 09/30/12 1820 10/01/12 0500 10/01/12 0540  HGB  --   < > 11.9* 10.4*  --  9.8*  HCT  --   --  35.0* 31.7*  --  29.4*  PLT  --   --   --  219  --  232  APTT 35  --   --   --  32  --   LABPROT 14.2  --   --   --  16.1*  --   INR 1.12  --   --   --  1.32  --   CREATININE  --   --  2.70*  --   --  2.26*  < > = values in this interval not displayed.  Estimated Creatinine Clearance: 29.6 ml/min (by C-G formula based on Cr of 2.26).   Medications:  Prescriptions prior to admission  Medication Sig Dispense Refill  . carvedilol (COREG) 6.25 MG tablet Take 1 tablet (6.25 mg total) by mouth 2 (two) times daily with a meal.  180 tablet  3  . DULoxetine (CYMBALTA) 30 MG capsule Take 1 capsule (30 mg total) by mouth daily.  30 capsule  5  . finasteride (PROSCAR) 5 MG tablet Take 5 mg by mouth daily.      . furosemide (LASIX) 40 MG tablet Take 1 tablet (40 mg total) by mouth daily.  90 tablet  3  . gabapentin (NEURONTIN) 300 MG capsule Take 300 mg by mouth 3 (three) times daily.      . insulin aspart protamine-insulin aspart (NOVOLOG 70/30) (70-30) 100 UNIT/ML injection Inject 10 Units into the skin 2 (two) times daily with a meal. 10 units with breakfast, and 10 units with the evening meal      . losartan (COZAAR) 100 MG tablet Take 1 tablet (100 mg total) by mouth daily.  90 tablet  3  . oxyCODONE-acetaminophen (PERCOCET/ROXICET) 5-325 MG per  tablet Take 1-2 tablets by mouth every 4 (four) hours as needed.  40 tablet  0  . warfarin (COUMADIN) 2.5 MG tablet Take 2.5 mg by mouth See admin instructions. Take 2.5mg  daily except no coumadin on Thursdays      . NITROSTAT 0.4 MG SL tablet Place 0.4 mg under the tongue every 5 (five) minutes as needed for chest pain. For chest pain, max 3 doses       Scheduled:  . carvedilol  6.25 mg Oral BID WC  . cefUROXime (ZINACEF)  IV  1.5 g Intravenous Q12H  . Chlorhexidine Gluconate Cloth  6 each Topical Daily  . docusate sodium  100 mg Oral Daily  . DULoxetine  30 mg Oral Daily  . finasteride  5 mg Oral Daily  . furosemide  40 mg Oral Daily  . gabapentin  300 mg Oral TID  . insulin aspart  0-15 Units Subcutaneous TID WC  . losartan  100 mg Oral Daily  . mupirocin ointment  1 application Nasal BID  .  pantoprazole  40 mg Oral Daily  . Warfarin - Pharmacist Dosing Inpatient   Does not apply q1800   Infusions:  . DOPamine    . heparin 600 Units/hr (10/01/12 0700)    Assessment: 78 year old male with PVD and occlusion of his left femoral popliteal Gore-Tex bypass (surgery on 7/16, occluded after 36 hrs), s/p surgical intervention on 7/30. Patient also has hx of afib and ICD implant. Patient is currently on 600 units/hr of heparin (dosed by MD) and this is Day #2 of overlap, receiving a one time dose yesterday of Coumadin 7.5mg .  Today's INR is only at 1.32 and patient's H/H is low s/p procedure.   Goal of Therapy:  INR 2-3 Monitor platelets by anticoagulation protocol: Yes   Plan:  - Coumadin PO 5mg  X 1 dose tonight - daily INR's - monitor for s/s of bleeding  Harrold Donath E. Achilles Dunk, PharmD Clinical Pharmacist - Resident Pager: [phone] Pharmacy: [phone] 10/01/2012 8:54 AM

## 2012-10-01 NOTE — Progress Notes (Signed)
Inpatient Diabetes Program Recommendations  AACE/ADA: New Consensus Statement on Inpatient Glycemic Control (2013)  Target Ranges:  Prepandial:   less than 140 mg/dL      Peak postprandial:   less than 180 mg/dL (1-2 hours)      Critically ill patients:  140 - 180 mg/dL   Reason for Visit: Diabetes Consult  77 year old male admitted for surgery -LEFT FEMORAL-POPLITEAL BYPASS GRAFT WITH GORTEX. Hx IDDM, good glucose control with HgBA1C of 6.1% on 09/30/2012. On Novolog 70/30 10 units bid at home.  Results for DRAYK, HUMBARGER (MRN [id number]) as of 10/01/2012 11:29  Ref. Range 09/30/2012 18:20  Hemoglobin A1C Latest Range: <5.7 % 6.1 (H)  Results for GURLEY, CLIMER (MRN [id number]) as of 10/01/2012 11:29  Ref. Range 09/30/2012 07:01 09/30/2012 15:01 09/30/2012 21:33 10/01/2012 08:07  Glucose-Capillary Latest Range: 70-99 mg/dL 865 (H) 784 (H) 696 (H) 120 (H)   Good glycemic control. Continue with home regimen while inpatient. Will continue to follow.  Thank you. Ailene Ards, RD, LDN, CDE Inpatient Diabetes Coordinator [phone]

## 2012-10-01 NOTE — Evaluation (Addendum)
Physical Therapy Evaluation Patient Details Name: [name] MRN: [id number] DOB: 17-May-1934 Today's Date: 10/01/2012 Time: 2130-8657 PT Time Calculation (min): 17 min  PT Assessment / Plan / Recommendation History of Present Illness  Pt s/p LLE bypass graft. Pt with history of multiple bypass grafts.  Clinical Impression  Pt admitted with above. Pt currently with functional limitations due to the deficits listed below (see PT Problem List).  Pt will benefit from skilled PT to increase their independence and safety with mobility to allow discharge to the venue listed below. Pt would prefer to use cane instead of walker.  Will continue to assess equipment needs.      PT Assessment  Patient needs continued PT services    Follow Up Recommendations  No PT follow up    Does the patient have the potential to tolerate intense rehabilitation      Barriers to Discharge        Equipment Recommendations  Other (comment) (To be determined)    Recommendations for Other Services     Frequency Min 3X/week    Precautions / Restrictions Precautions Precautions: None   Pertinent Vitals/Pain LLE pain.  Used walker.      Mobility  Bed Mobility Bed Mobility: Not assessed (pt sitting EOB) Transfers Sit to Stand: 5: Supervision;From bed;With upper extremity assist Stand to Sit: 5: Supervision;To bed;With upper extremity assist Ambulation/Gait Ambulation/Gait Assistance: 4: Min guard;5: Supervision Ambulation Distance (Feet): 150 Feet Assistive device: Straight cane;Rolling walker Ambulation/Gait Assistance Details: Min guard with cane and supervision with walker.  Difficulty bearing wt on LLE with cane - improved with walker Gait Pattern: Step-to pattern;Decreased stance time - left;Left flexed knee in stance Gait velocity: decr    Exercises     PT Diagnosis: Difficulty walking;Acute pain  PT Problem List: Decreased mobility;Decreased balance;Decreased knowledge of use of  DME PT Treatment Interventions: DME instruction;Gait training;Functional mobility training;Therapeutic activities;Patient/family education     PT Goals(Current goals can be found in the care plan section) Acute Rehab PT Goals Patient Stated Goal: to return home PT Goal Formulation: With patient Time For Goal Achievement: 10/05/12 Potential to Achieve Goals: Good  Visit Information  Last PT Received On: 10/01/12 Assistance Needed: +1 History of Present Illness: Pt s/p LLE bypass graft. Pt with history of multiple bypass grafts.       Prior Functioning  Home Living Family/patient expects to be discharged to:: Private residence Living Arrangements: Spouse/significant other Available Help at Discharge: Family;Available 24 hours/day Type of Home: House Home Access: Level entry Home Layout: One level Home Equipment: Cane - single point Prior Function Level of Independence: Independent with assistive device(s) Communication Communication: No difficulties Dominant Hand: Right    Cognition  Cognition Arousal/Alertness: Awake/alert Behavior During Therapy: WFL for tasks assessed/performed Overall Cognitive Status: Within Functional Limits for tasks assessed    Extremity/Trunk Assessment Upper Extremity Assessment Upper Extremity Assessment: Overall WFL for tasks assessed (LUE sore but able to use [employer] for tasks assessed) Lower Extremity Assessment LLE Deficits / Details: grossly 3+/5 due to soreness   Balance Balance Balance Assessed: Yes Static Standing Balance Static Standing - Balance Support: Bilateral upper extremity supported Static Standing - Level of Assistance: 6: Modified independent (Device/Increase time)  End of Session PT - End of Session Activity Tolerance: Patient tolerated treatment well Patient left: in bed;with call bell/phone within reach (sitting EOB) Nurse Communication: Other (comment) (removed wrap from lt heel. May need foam dressing)  GP      Sanuel Ladnier 10/01/2012, 5:41  PM  [employer] PT [phone]

## 2012-10-01 NOTE — Progress Notes (Addendum)
[facility] Progress Note  10/01/2012 7:21 AM 1 Day Post-Op  Subjective:  C/o of sore throat and soreness in leg  Afebrile VSS 100% RA  Filed Vitals:   10/01/12 0330  BP: 107/50  Pulse: 69  Temp: 97.6 F (36.4 C)  Resp: 14    Physical Exam: Incisions:  Left groin is c/d/i without hematoma; Left lower leg incision is c/d/i; left arm incisions-all are c/d/i with some ecchymosis around incisions Extremities:  Left foot is warm with brisk left AT  CBC    Component Value Date/Time   WBC 7.8 10/01/2012 0540   RBC 3.50* 10/01/2012 0540   HGB 9.8* 10/01/2012 0540   HCT 29.4* 10/01/2012 0540   PLT 232 10/01/2012 0540   MCV 84.0 10/01/2012 0540   MCH 28.0 10/01/2012 0540   MCHC 33.3 10/01/2012 0540   RDW 14.9 10/01/2012 0540   LYMPHSABS 1.4 07/10/2012 2020   MONOABS 1.0 07/10/2012 2020   EOSABS 0.1 07/10/2012 2020   BASOSABS 0.0 07/10/2012 2020    BMET    Component Value Date/Time   NA 137 10/01/2012 0540   K 5.4* 10/01/2012 0540   CL 107 10/01/2012 0540   CO2 19 10/01/2012 0540   GLUCOSE 117* 10/01/2012 0540   BUN 51* 10/01/2012 0540   CREATININE 2.26* 10/01/2012 0540   CREATININE 2.68* 09/07/2012 0812   CALCIUM 8.4 10/01/2012 0540   GFRNONAA 26* 10/01/2012 0540   GFRAA 31* 10/01/2012 0540    INR    Component Value Date/Time   INR 1.32 10/01/2012 0500   INR 2.1 09/07/2012 0856     Intake/Output Summary (Last 24 hours) at 10/01/12 0721 Last data filed at 10/01/12 0600  Gross per 24 hour  Intake   3373 ml  Output   1105 ml  Net   2268 ml     Assessment/Plan:  78 y.o. male is s/p:  REDO LEFT FEMORAL-ANTERIOR TIBIAL ARTERY BYPASS USING COMPOSITE CEPHALIC AND BASILIC VEIN GRAFT FROM LEFT ARM   1 Day Post-Op  -pt doing well this am and graft is patent -DVT prophylaxis:  Heparin gtt-pt received coumadin 7.5 mg last pm-INR is 1.32 -continue heparin @ 800 units/hour until INR is therapeutic  -pharmacy consult for coumadin management -acute surgical blood loss  anemia-pt tolerating -mobilize today -transfer to 2000 -continue to float heels -BUN/Cr stable -d/c IVF -mild hyperkalemia-at baseline-will receive lasix this am. -ck labs in am    Doreatha Massed, PA-C [facility] [phone] 10/01/2012 7:21 AM   agree with above assessment 3+ pulse in left femoral to anterior tibial graft with excellent Doppler flow left anterior tibial artery Left foot pink and well perfused Foley out Patient to transfer to 2000 today and ambulate  Up to DC home in a few days when ambulating well We'll continue heparin increased to 800 units per hour until DC when patient will go home on Coumadin

## 2012-10-01 NOTE — Progress Notes (Signed)
Utilization review completed.  

## 2012-10-01 NOTE — Progress Notes (Signed)
VASCULAR LAB PRELIMINARY  ARTERIAL  ABI completed:    RIGHT    LEFT    PRESSURE WAVEFORM  PRESSURE WAVEFORM  BRACHIAL 131 Triphasic BRACHIAL Restricted limb due to surgical incisions   DP 73 Monophasic DP 76 Monophasic  PT 56 Monophasic PT 60 Monophasic    RIGHT LEFT  ABI 0.56 0.58   ABI indicates a moderate to severe reduction in arterial flow bilaterally at rest post operative.  Renna Kilmer, RVS 10/01/2012, 4:41 PM

## 2012-10-01 NOTE — Progress Notes (Signed)
Third attempt to call report on patient. RN stated she is unavailable "in progression meeting". She will call when meeting over.

## 2012-10-01 NOTE — Progress Notes (Signed)
Pt. Moved to new room by tech via wheelchair. All belongings sent with patient including meds. Transferred on telemetry. All wound sites dry and intact.

## 2012-10-01 NOTE — Evaluation (Signed)
Occupational Therapy Evaluation Patient Details Name: [name] MRN: [id number] DOB: 1934-09-02 Today's Date: 10/01/2012 Time: 4098-1191 OT Time Calculation (min): 19 min  OT Assessment / Plan / Recommendation History of present illness Pt s/p LLE bypass graft. Pt with history of multiple bypass grafts.   Clinical Impression   Pt admitted with above.  Pt currently limited by pain and demonstrating decreased activity tolerance as well as below problem list. Will follow pt acutely in order to address below problem list in prep for return home with significant other.    OT Assessment  Patient needs continued OT Services    Follow Up Recommendations  No OT follow up;Supervision - Intermittent    Barriers to Discharge      Equipment Recommendations  None recommended by OT    Recommendations for Other Services    Frequency  Min 2X/week    Precautions / Restrictions Precautions Precautions: None   Pertinent Vitals/Pain See vitals    ADL  Eating/Feeding: Performed;Independent Where Assessed - Eating/Feeding: Edge of bed Upper Body Dressing: Performed;Set up Where Assessed - Upper Body Dressing: Unsupported sitting Lower Body Dressing: Performed;Moderate assistance (left sock) Where Assessed - Lower Body Dressing: Unsupported sitting Toilet Transfer: Simulated;Supervision/safety Toilet Transfer Method:  (ambulating) Acupuncturist:  (bed) Equipment Used: Cane;Rolling walker Transfers/Ambulation Related to ADLs: Min guard assist ambulating with cane (unsteady gait and incr time).  Able to ambulate with supervision when given RW due to able to increase support through bil UEs. ADL Comments: Incr time for all tasks due to LUE/LLE sore.    OT Diagnosis: Generalized weakness;Acute pain  OT Problem List: Decreased strength;Decreased activity tolerance;Impaired balance (sitting and/or standing);Decreased knowledge of use of DME or AE;Pain OT Treatment Interventions:  Self-care/ADL training;DME and/or AE instruction;Therapeutic activities;Patient/family education;Balance training   OT Goals(Current goals can be found in the care plan section) Acute Rehab OT Goals Patient Stated Goal: to return home OT Goal Formulation: With patient Time For Goal Achievement: 10/08/12 Potential to Achieve Goals: Good  Visit Information  Last OT Received On: 10/01/12 Assistance Needed: +1 PT/OT Co-Evaluation/Treatment: Yes History of Present Illness: Pt s/p LLE bypass graft. Pt with history of multiple bypass grafts.       Prior Functioning     Home Living Family/patient expects to be discharged to:: Private residence Living Arrangements: Spouse/significant other Available Help at Discharge: Family;Available 24 hours/day Type of Home: House Home Access: Level entry Home Layout: One level Home Equipment: Cane - single point Prior Function Level of Independence: Independent with assistive device(s) Communication Communication: No difficulties Dominant Hand: Right         Vision/Perception     Cognition  Cognition Arousal/Alertness: Awake/alert Behavior During Therapy: WFL for tasks assessed/performed Overall Cognitive Status: Within Functional Limits for tasks assessed    Extremity/Trunk Assessment Upper Extremity Assessment Upper Extremity Assessment: Overall WFL for tasks assessed (LUE sore but able to use [facility] for tasks assessed) Lower Extremity Assessment LLE Deficits / Details: grossly 3+/5 due to soreness     Mobility Bed Mobility Bed Mobility: Not assessed (pt sitting EOB) Transfers Transfers: Sit to Stand;Stand to Sit Sit to Stand: 5: Supervision;From bed;With upper extremity assist Stand to Sit: 5: Supervision;To bed;With upper extremity assist     Exercise     Balance Balance Balance Assessed: Yes Static Standing Balance Static Standing - Balance Support: Bilateral upper extremity supported Static Standing - Level of  Assistance: 6: Modified independent (Device/Increase time)   End of Session OT - End of  Session Equipment Utilized During Treatment: Rolling walker Activity Tolerance: Patient tolerated treatment well Patient left: in bed;with call bell/phone within reach Nurse Communication: Mobility status  GO   10/01/2012 Cipriano Mile OTR/L Pager [phone] Office [phone]   Cipriano Mile 10/01/2012, 4:52 PM

## 2012-10-02 ENCOUNTER — Encounter (HOSPITAL_COMMUNITY): Payer: Self-pay | Admitting: Vascular Surgery

## 2012-10-02 LAB — GLUCOSE, CAPILLARY: Glucose-Capillary: 142 mg/dL — ABNORMAL HIGH (ref 70–99)

## 2012-10-02 LAB — PROTIME-INR
INR: 1.36 (ref 0.00–1.49)
Prothrombin Time: 16.4 seconds — ABNORMAL HIGH (ref 11.6–15.2)

## 2012-10-02 LAB — BASIC METABOLIC PANEL
BUN: 57 mg/dL — ABNORMAL HIGH (ref 6–23)
Calcium: 8.5 mg/dL (ref 8.4–10.5)
Creatinine, Ser: 2.8 mg/dL — ABNORMAL HIGH (ref 0.50–1.35)
GFR calc Af Amer: 24 mL/min — ABNORMAL LOW (ref >90)
GFR calc non Af Amer: 20 mL/min — ABNORMAL LOW (ref >90)
Glucose, Bld: 98 mg/dL (ref 70–99)
Potassium: 4.8 mEq/L (ref 3.5–5.1)

## 2012-10-02 MED ORDER — WARFARIN SODIUM 7.5 MG PO TABS
7.5000 mg | ORAL_TABLET | Freq: Once | ORAL | Status: AC
  Administered 2012-10-02: 7.5 mg via ORAL
  Filled 2012-10-02: qty 1

## 2012-10-02 NOTE — Progress Notes (Signed)
10/02/12 Patient assisted x1 assist with walker to chair, L foot elevated on pillows as directed by Dr Hart [place]. Will continue to monitor patient . [name], Randall An RN

## 2012-10-02 NOTE — Progress Notes (Signed)
ANTICOAGULATION CONSULT NOTE - Follow Up Consult  Pharmacy Consult for Coumadin Indication: atrial fibrillation and prevention of femoral bypass graft occlusion  Allergies  Allergen Reactions  . Other Other (See Comments)    Plastic tape tears skin   Patient Measurements: Height: 5\' 8"  (172.7 cm) Weight: 188 lb 0.8 oz (85.3 kg) IBW/kg (Calculated) : 68.4  Vital Signs: Temp: 98.5 F (36.9 C) (08/01 0559) Temp src: Oral (08/01 0559) BP: 109/64 mmHg (08/01 1100) Pulse Rate: 70 (08/01 0900)  Labs:  Recent Labs  09/30/12 0649 09/30/12 1820 10/01/12 0500 10/01/12 0540 10/02/12 0505 10/02/12 0515  HGB 11.9* 10.4*  --  9.8*  --   --   HCT 35.0* 31.7*  --  29.4*  --   --   PLT  --  219  --  232  --   --   APTT  --   --  32  --  42*  --   LABPROT  --   --  16.1*  --   --  16.4*  INR  --   --  1.32  --   --  1.36  CREATININE 2.70*  --   --  2.26* 2.80*  --     Estimated Creatinine Clearance: 23.9 ml/min (by C-G formula based on Cr of 2.8).   Scheduled:  . carvedilol  6.25 mg Oral BID WC  . Chlorhexidine Gluconate Cloth  6 each Topical Daily  . docusate sodium  100 mg Oral Daily  . DULoxetine  30 mg Oral Daily  . finasteride  5 mg Oral Daily  . furosemide  40 mg Oral Daily  . gabapentin  300 mg Oral TID  . insulin aspart  0-15 Units Subcutaneous TID WC  . insulin aspart protamine- aspart  10 Units Subcutaneous BID WC  . losartan  100 mg Oral Daily  . mupirocin ointment  1 application Nasal BID  . pantoprazole  40 mg Oral Daily  . Warfarin - Pharmacist Dosing Inpatient   Does not apply q1800   Infusions:  . heparin 800 Units/hr (10/02/12 1124)    Assessment: 78 year old male with PVD and occlusion of his left femoral popliteal Gore-Tex bypass (surgery on 7/16, occluded after 36 hrs), s/p surgical intervention on 7/30. Patient also has hx of afib and ICD implant. Patient is currently on 800 units/hr of heparin (dosed by MD) and this is Day #3 of overlap.  Today's INR  is 1.36.  Home dose of warfarin is 2.5mg  daily except no warfarin on Thursday.   Goal of Therapy:  INR 2-3 Monitor platelets by anticoagulation protocol: Yes   Plan:  - Coumadin PO 7.5mg  X 1 dose tonight - daily INR's - monitor for s/s of bleeding  Celedonio Miyamoto, PharmD, BCPS Clinical Pharmacist Pager [phone]   10/02/2012 11:35 AM

## 2012-10-02 NOTE — Progress Notes (Signed)
Physical Therapy Treatment Patient Details Name: [name] MRN: [id number] DOB: 08/12/1934 Today's Date: 10/02/2012 Time: 2725-3664 PT Time Calculation (min): 24 min  PT Assessment / Plan / Recommendation  History of Present Illness Pt s/p LLE bypass graft. Pt with history of multiple bypass grafts.   PT Comments   Patient demonstrates improvements towards PT goals. Still some instability with SPC use, recommended rw but patient insistent on using cane. Patient educated on ROM techniques and exercises. Patient also educated on positioning for edema control. Will continue to see patient as indicated and progress activity as tolerated.   Follow Up Recommendations  No PT follow up           Equipment Recommendations  Other (comment) (To be determined)    Recommendations for Other Services    Frequency Min 3X/week   Progress towards PT Goals    Plan Current plan remains appropriate    Precautions / Restrictions Precautions Precautions: None   Pertinent Vitals/Pain 3/10 pain (pre medicated)    Mobility  Bed Mobility Bed Mobility: Supine to Sit;Sitting - Scoot to Edge of Bed Supine to Sit: 5: Supervision Sitting - Scoot to Edge of Bed: 5: Supervision Details for Bed Mobility Assistance: Patient wished to remain sitting EOb after session. bed mobility supervision, no physical assist needed Transfers Transfers: Sit to Stand;Stand to Sit Sit to Stand: 5: Supervision;From bed;With upper extremity assist Stand to Sit: 5: Supervision;To bed;With upper extremity assist Details for Transfer Assistance: Increased time to perform Ambulation/Gait Ambulation/Gait Assistance: 4: Min guard;5: Supervision Ambulation Distance (Feet): 160 Feet Assistive device: Straight cane;Rolling walker Ambulation/Gait Assistance Details: some instability noted, no physical assist, recommended use of rw, however patient insistant on use of cane.  Gait Pattern: Step-to pattern;Decreased stance time -  left;Left flexed knee in stance Gait velocity: decr    Exercises General Exercises - Lower Extremity Ankle Circles/Pumps: AROM;Both;10 reps;Supine Long Arc Quad: AROM;Both;10 reps;Seated Other Exercises Other Exercises: R ankle ROM exercises     PT Goals (current goals can now be found in the care plan section) Acute Rehab PT Goals Patient Stated Goal: to return home PT Goal Formulation: With patient Time For Goal Achievement: 10/05/12 Potential to Achieve Goals: Good  Visit Information  Last PT Received On: 10/02/12 Assistance Needed: +1 History of Present Illness: Pt s/p LLE bypass graft. Pt with history of multiple bypass grafts.    Subjective Data  Subjective: It was hurting a lot earlier but it is feeling better now Patient Stated Goal: to return home   Cognition  Cognition Arousal/Alertness: Awake/alert Behavior During Therapy: WFL for tasks assessed/performed Overall Cognitive Status: Within Functional Limits for tasks assessed    Balance  Balance Balance Assessed: Yes Static Standing Balance Static Standing - Balance Support: Bilateral upper extremity supported Static Standing - Level of Assistance: 6: Modified independent (Device/Increase time)  End of Session PT - End of Session Activity Tolerance: Patient tolerated treatment well Patient left: in bed;with call bell/phone within reach (sitting EOB) Nurse Communication: Other (comment) (pt experiencing tinnitus in right ear, wanted to ask doctor )   GP     Fabio Asa 10/02/2012, 3:15 PM Charlotte Crumb, PT DPT  [phone]

## 2012-10-02 NOTE — Clinical Documentation Improvement (Signed)
THIS DOCUMENT IS NOT A PERMANENT PART OF THE MEDICAL RECORD  Please update your documentation with the medical record to reflect your response to this query. If you need help knowing how to do this please call [phone].  10/02/12   Dear Doreatha Massed, PA-C,  In a better effort to capture your patient's severity of illness, reflect appropriate length of stay and utilization of resources, a review of the patient medical record has revealed the following indicators.    Based on your clinical judgment, please clarify and document in a progress note and/or discharge summary the clinical condition associated with the following supporting information: Patient had elevated BUN/Creatinine on admission, and you noted Renal Insufficiency in the Secondary Diagnosis list in the discharge summary from 09/19/12.    In responding to this query please exercise your independent judgment.  The fact that a query is asked, does not imply that any particular answer is desired or expected.  Possible Clinical Conditions?   _______CKD Stage I -  GFR > OR = 90 _______CKD Stage II - GFR 60-80 _______CKD Stage III - GFR 30-59 _____x__CKD Stage IV - GFR 15-29 _______CKD Stage V - GFR < 15 _______ESRD (End Stage Renal Disease) _______Cannot Clinically determine    Lab:   Creatinine level (baseline and current): 10/02/12  Creatinine 2.80 mg/dL 10/31/54 Creatinine 2.13 mg/dL 0/8/65  Creatinine 7.84 mg/dL 08/10/60  Creatinine 2.0 mg/dL  Calculated GFR:  11/07/26  GFR  20 mL/min 09/30/12 GFR  26 mL/min   07/08/12  GFR  27 mL/min    06/04/10  GFR  35 mL/min   You may use possible, probable, or suspect with inpatient documentation.  Possible, probable, suspected diagnoses MUST be documented at the time of discharge.  Reviewed:  no additional documentation provided   Thank You,  Darla Lesches, RN, BSN, CCRN Clinical Documentation Improvement Specialist Office [phone]  [facility] department--[place]

## 2012-10-02 NOTE — Progress Notes (Signed)
Patient ID: [name], male   DOB: 09-03-1934, 78 y.o.   MRN: [id number] Vascular Surgery Progress Note  Subjective: 2 days post left femoral anterior tibial-redo bypass using composite left cephalic and basilic vein graft for severe ischemia left foot. Patient states left foot continues to feel much better than preop. He is ambulating short distances. Voiding without difficulties. Remains on heparin drip and Coumadin has been reestablished day of surgery.  Objective:  Filed Vitals:   10/02/12 0900  BP: 120/70  Pulse: 70  Temp:   Resp:     General well-developed well-nourished male in no apparent stress alert and oriented x3 Lungs no rhonchi or wheezing Left lower extremit with nicely healing incisions and 2-3+ pulses and subcutaneous left femoral anterior tibial graft. No palpable dorsalis pedis pulse but excellent Doppler flow. Left foot well perfused Vein harvesting incisions in left upper extremity are healing satisfactorily  Postop ABI only 0.58 in left foot which is probably consistent with caliber of arm pain   Labs:  Recent Labs Lab 09/30/12 0649 10/01/12 0540 10/02/12 0505  CREATININE 2.70* 2.26* 2.80*    Recent Labs Lab 09/30/12 0649 10/01/12 0540 10/02/12 0505  NA 142 137 138  K 5.0 5.4* 4.8  CL 110 107 107  CO2  --  19 19  BUN 58* 51* 57*  CREATININE 2.70* 2.26* 2.80*  GLUCOSE 119* 117* 98  CALCIUM  --  8.4 8.5    Recent Labs Lab 09/30/12 0649 09/30/12 1820 10/01/12 0540  WBC  --  8.0 7.8  HGB 11.9* 10.4* 9.8*  HCT 35.0* 31.7* 29.4*  PLT  --  219 232    Recent Labs Lab 09/28/12 1500 10/01/12 0500  INR 1.12 1.32    I/O last 3 completed shifts: In: 1562 [P.O.:840; I.V.:672; IV Piggyback:50] Out: 850 [Urine:850]  Imaging: No results found.  Assessment/Plan:  POD #2  LOS: 2 days  s/p Procedure(s): REDO LEFT FEMORAL-ANTERIOR TIBIAL ARTERY BYPASS USING COMPOSITE CEPHALIC AND BASILIC VEIN GRAFT FROM LEFT ARM  Generally doing  well following redo femoral anterior tibial bypass with composite arm vein Like for INR to get at least 1.8-2.0 prior to discharge. Will continue on IV heparin drip and continued to mobilize patient and hopefully DC home the next few days. Have emphasized nursing staff the importance of keeping heel off the bed because he does have pressure sore which was present preoperatively.   [name], MD 10/02/2012 10:08 AM

## 2012-10-03 ENCOUNTER — Inpatient Hospital Stay (HOSPITAL_COMMUNITY): Payer: Medicare Other

## 2012-10-03 LAB — CBC
HCT: 29.4 % — ABNORMAL LOW (ref 39.0–52.0)
HCT: 33 % — ABNORMAL LOW (ref 39.0–52.0)
Hemoglobin: 10 g/dL — ABNORMAL LOW (ref 13.0–17.0)
Hemoglobin: 10.7 g/dL — ABNORMAL LOW (ref 13.0–17.0)
MCH: 28.8 pg (ref 26.0–34.0)
MCHC: 34 g/dL (ref 30.0–36.0)
MCHC: 32.4 g/dL (ref 30.0–36.0)
MCV: 84.7 fL (ref 78.0–100.0)
MCV: 85.1 fL (ref 78.0–100.0)
RDW: 15.1 % (ref 11.5–15.5)
WBC: 16.3 10*3/uL — ABNORMAL HIGH (ref 4.0–10.5)

## 2012-10-03 LAB — GLUCOSE, CAPILLARY: Glucose-Capillary: 138 mg/dL — ABNORMAL HIGH (ref 70–99)

## 2012-10-03 LAB — APTT: aPTT: 47 seconds — ABNORMAL HIGH (ref 24–37)

## 2012-10-03 MED ORDER — WARFARIN SODIUM 7.5 MG PO TABS
7.5000 mg | ORAL_TABLET | Freq: Once | ORAL | Status: AC
  Administered 2012-10-03: 7.5 mg via ORAL
  Filled 2012-10-03: qty 1

## 2012-10-03 NOTE — Progress Notes (Signed)
Patient ID: [name], male   DOB: Jul 08, 1934, 78 y.o.   MRN: [id number] The patient had an episode this evening with a high fever to 103 range, he also had confusion. He was hypertensive. Skin saturations remained greater than 90.  Currently on physical exam he is very diaphoretic and sweaty. He is mildly confused. He recognizes me and understands that he is in the hospital for leg surgery. He cannot state the year or who is present.  His arm and groin incisions and leg incisions are all healing well with no evidence of purulence or erythema.  Currently remaining hemodynamically stable with systolic blood pressure in the 130s.  We'll check a chest x-ray, urinalysis, blood cultures with no obvious source of fever or sepsis. Continue current support

## 2012-10-03 NOTE — Progress Notes (Addendum)
[facility] Progress Note  10/03/2012 9:17 AM 3 Days Post-Op  Subjective:  No complaints this am  Afebrile VSS 100% RA  Filed Vitals:   10/03/12 0519  BP: 119/68  Pulse: 71  Temp: 98 F (36.7 C)  Resp: 18    Physical Exam: Incisions:  LUE incisions are all c/d/i and healing; left groin incision is c/d/i without hematoma; left below knee incision is c/d/i. Extremities:  Left DP/PT with + doppler signal  CBC    Component Value Date/Time   WBC 8.7 10/03/2012 0345   RBC 3.47* 10/03/2012 0345   HGB 10.0* 10/03/2012 0345   HCT 29.4* 10/03/2012 0345   PLT 228 10/03/2012 0345   MCV 84.7 10/03/2012 0345   MCH 28.8 10/03/2012 0345   MCHC 34.0 10/03/2012 0345   RDW 15.1 10/03/2012 0345   LYMPHSABS 1.4 07/10/2012 2020   MONOABS 1.0 07/10/2012 2020   EOSABS 0.1 07/10/2012 2020   BASOSABS 0.0 07/10/2012 2020    BMET    Component Value Date/Time   NA 138 10/02/2012 0505   K 4.8 10/02/2012 0505   CL 107 10/02/2012 0505   CO2 19 10/02/2012 0505   GLUCOSE 98 10/02/2012 0505   BUN 57* 10/02/2012 0505   CREATININE 2.80* 10/02/2012 0505   CREATININE 2.68* 09/07/2012 0812   CALCIUM 8.5 10/02/2012 0505   GFRNONAA 20* 10/02/2012 0505   GFRAA 24* 10/02/2012 0505    INR    Component Value Date/Time   INR 1.44 10/03/2012 0345   INR 2.1 09/07/2012 0856     Intake/Output Summary (Last 24 hours) at 10/03/12 0917 Last data filed at 10/03/12 0700  Gross per 24 hour  Intake  545.5 ml  Output   1625 ml  Net -1079.5 ml     Assessment/Plan:  78 y.o. male is s/p:  REDO LEFT FEMORAL-ANTERIOR TIBIAL ARTERY BYPASS USING COMPOSITE CEPHALIC AND BASILIC VEIN GRAFT FROM LEFT ARM   3 Days Post-Op  -bypass graft is patent -continue to float heels-emphasized this to pt and RN -DVT prophylaxis:  Heparin/coumadin bridge -INR is trending upward with 7.5 mg of coumadin last pm. -Hgb stable -continue to mobilize  -will discharge when INR is 1.8 to 2.0    Doreatha Massed, PA-C [facility] [phone] 10/03/2012 9:17 AM    I have examined the patient, reviewed and agree with above. Continue to mobilize. Patient has 2+ graft pulse over the lateral thigh down to his anterior tibial region with a well-perfused foot.  EARLY, [name], MD 10/03/2012 12:27 PM

## 2012-10-03 NOTE — Progress Notes (Signed)
PHARMACY FOLLOW UP NOTE  Pharmacy Consult for : Coumadin with Heparin bridging  Indication:  atrial fibrillation and prevention of femoral bypass graft occlusion  Labs:  Recent Labs  09/30/12 1820 10/01/12 0500 10/01/12 0540 10/02/12 0505 10/02/12 0515 10/03/12 0345  HGB 10.4*  --  9.8*  --   --  10.0*  HCT 31.7*  --  29.4*  --   --  29.4*  PLT 219  --  232  --   --  228  APTT  --  32  --  42*  --  47*  LABPROT  --  16.1*  --   --  16.4* 17.2*  INR  --  1.32  --   --  1.36 1.44  CREATININE  --   --  2.26* 2.80*  --   --    Lab Results  Component Value Date   INR 1.44 10/03/2012   INR 1.36 10/02/2012   INR 1.32 10/01/2012    Medications:  Medication Sig  . warfarin (COUMADIN) 2.5 MG tablet Take 2.5 mg by mouth See admin instructions. Take 2.5mg  daily except no coumadin on Thursdays   Scheduled:  . carvedilol  6.25 mg Oral BID WC  . Chlorhexidine Gluconate Cloth  6 each Topical Daily  . docusate sodium  100 mg Oral Daily  . DULoxetine  30 mg Oral Daily  . finasteride  5 mg Oral Daily  . furosemide  40 mg Oral Daily  . gabapentin  300 mg Oral TID  . insulin aspart  0-15 Units Subcutaneous TID WC  . insulin aspart protamine- aspart  10 Units Subcutaneous BID WC  . mupirocin ointment  1 application Nasal BID  . pantoprazole  40 mg Oral Daily  . Warfarin - Pharmacist Dosing Inpatient   Does not apply q1800   Infusions:  . HEPARIN 800 Units/hr (10/02/12 1800)   Assessment:  POD # 3 Redo Fem-Pop BPG.  Patient on Coumadin with Heparin bridging   INR trending upward toward clinical INR goal.  INR 1.44.  APTT 47 sec.  No bleeding complications noted   Goal of Therapy:  INR 2-3  aPTT goal as per MD.   Plan:  Continue Heparin bridging per MD dosing   Repeat Coumadin 7.5 mg today. Daily INR's, CBC, APTT.  Monitor for bleeding complications.     Lazarius Rivkin, Deetta Perla.D 10/03/2012, 10:52 AM

## 2012-10-04 LAB — CBC
HCT: 28.9 % — ABNORMAL LOW (ref 39.0–52.0)
MCHC: 32.9 g/dL (ref 30.0–36.0)
Platelets: 194 10*3/uL (ref 150–400)
RDW: 14.9 % (ref 11.5–15.5)
WBC: 8.9 10*3/uL (ref 4.0–10.5)

## 2012-10-04 LAB — URINALYSIS, ROUTINE W REFLEX MICROSCOPIC
Hgb urine dipstick: NEGATIVE
Nitrite: NEGATIVE
Protein, ur: NEGATIVE mg/dL
Urobilinogen, UA: 0.2 mg/dL (ref 0.0–1.0)

## 2012-10-04 LAB — BASIC METABOLIC PANEL
Calcium: 8.5 mg/dL (ref 8.4–10.5)
GFR calc non Af Amer: 23 mL/min — ABNORMAL LOW (ref >90)
Sodium: 133 mEq/L — ABNORMAL LOW (ref 135–145)

## 2012-10-04 LAB — GLUCOSE, CAPILLARY
Glucose-Capillary: 116 mg/dL — ABNORMAL HIGH (ref 70–99)
Glucose-Capillary: 160 mg/dL — ABNORMAL HIGH (ref 70–99)
Glucose-Capillary: 159 mg/dL — ABNORMAL HIGH (ref 70–99)
Glucose-Capillary: 94 mg/dL (ref 70–99)

## 2012-10-04 LAB — PROTIME-INR
INR: 2.45 — ABNORMAL HIGH (ref 0.00–1.49)
Prothrombin Time: 25.8 seconds — ABNORMAL HIGH (ref 11.6–15.2)

## 2012-10-04 LAB — APTT: aPTT: 46 seconds — ABNORMAL HIGH (ref 24–37)

## 2012-10-04 MED ORDER — WARFARIN SODIUM 1 MG PO TABS
1.0000 mg | ORAL_TABLET | Freq: Once | ORAL | Status: AC
  Administered 2012-10-04: 1 mg via ORAL
  Filled 2012-10-04: qty 1

## 2012-10-04 NOTE — Progress Notes (Addendum)
[facility] Progress Note  10/04/2012 8:50 AM 4 Days Post-Op  Subjective:  States he feels better this am.  Says that he had this problem with fever last month after his angioplasty.  Tm 102.8 now afebrile HR 60's-100's 100's-204 systolic (cannot find documentation of 200 systolic, but was reported to RN) 98% RA  Filed Vitals:   10/04/12 0508  BP: 136/76  Pulse: 70  Temp: 97.5 F (36.4 C)  Resp: 19    Physical Exam: Incisions:  Left groin incision is c/d/i as well as left lower leg incision without evidence of infection.  All left arm incisions are healing nicely without evidence of infection. Extremities:  Palpable graft pulse on lateral aspect of leg.  Right foot is warm and well perfused.  Lungs:  Non-labored and CTAB Abdomen:  Soft NT/ND; +BS Cardiac:  RRR  CBC    Component Value Date/Time   WBC 16.3* 10/03/2012 2230   RBC 3.88* 10/03/2012 2230   HGB 10.7* 10/03/2012 2230   HCT 33.0* 10/03/2012 2230   PLT 256 10/03/2012 2230   MCV 85.1 10/03/2012 2230   MCH 27.6 10/03/2012 2230   MCHC 32.4 10/03/2012 2230   RDW 15.0 10/03/2012 2230   LYMPHSABS 1.4 07/10/2012 2020   MONOABS 1.0 07/10/2012 2020   EOSABS 0.1 07/10/2012 2020   BASOSABS 0.0 07/10/2012 2020    BMET    Component Value Date/Time   NA 138 10/02/2012 0505   K 4.8 10/02/2012 0505   CL 107 10/02/2012 0505   CO2 19 10/02/2012 0505   GLUCOSE 98 10/02/2012 0505   BUN 57* 10/02/2012 0505   CREATININE 2.80* 10/02/2012 0505   CREATININE 2.68* 09/07/2012 0812   CALCIUM 8.5 10/02/2012 0505   GFRNONAA 20* 10/02/2012 0505   GFRAA 24* 10/02/2012 0505    INR    Component Value Date/Time   INR 1.44 10/03/2012 0345   INR 2.1 09/07/2012 0856     Intake/Output Summary (Last 24 hours) at 10/04/12 0850 Last data filed at 10/04/12 0700  Gross per 24 hour  Intake    840 ml  Output    720 ml  Net    120 ml   PCXR 10/03/12:   Findings: Cardiomegaly and left sided pacemaker / AICD again noted.  Coronary stent is again identified.  There  is no evidence of focal airspace disease, pulmonary edema,  suspicious pulmonary nodule/mass, pleural effusion, or  pneumothorax.  No acute bony abnormalities are identified.  IMPRESSION:  Cardiomegaly without evidence of acute cardiopulmonary disease.  Urinalysis    Component Value Date/Time   COLORURINE YELLOW 10/04/2012 0418   APPEARANCEUR CLEAR 10/04/2012 0418   LABSPEC 1.021 10/04/2012 0418   PHURINE 5.5 10/04/2012 0418   GLUCOSEU NEGATIVE 10/04/2012 0418   HGBUR NEGATIVE 10/04/2012 0418   HGBUR negative 12/29/2008 0818   BILIRUBINUR NEGATIVE 10/04/2012 0418   BILIRUBINUR n 05/06/2012 1210   KETONESUR 15* 10/04/2012 0418   PROTEINUR NEGATIVE 10/04/2012 0418   UROBILINOGEN 0.2 10/04/2012 0418   UROBILINOGEN 0.2 05/06/2012 1210   NITRITE NEGATIVE 10/04/2012 0418   NITRITE n 05/06/2012 1210   LEUKOCYTESUR NEGATIVE 10/04/2012 0418   Blood Culture is pending from 10/03/12   Assessment/Plan:  78 y.o. male is s/p:  REDO LEFT FEMORAL-ANTERIOR TIBIAL ARTERY BYPASS USING COMPOSITE CEPHALIC AND BASILIC VEIN GRAFT FROM LEFT ARM   4 Days Post-Op  -pt with fever over night and increased WBC.  CXR okay; urine is okay and blood culture is pending.  He  is afebrile this am.  There is no evidence of infection from his incisions, CXR, or U/A.  Will await blood culture.  -Pt states he had an episode similar to this last month after angioplasty and they never found a source at that time.  I cannot find any documentation of this with exception of nursing note of urine cx and PCR pending on 09/15/12.  -DVT prophylaxis:  Heparin/coumadin bridge  -unable to draw labs this am-will have to draw from foot and will check them later today.  -pt's renal function was 57/2.8 on 8/1 with good UOP; will recheck a BMP today along with other labs. -unable to document accurate UOP last pm due to incontinence-improved this am.   Doreatha Massed, PA-C [facility] [phone] 10/04/2012 8:50 AM    I have examined  the patient, reviewed and agree with above. Alert and oriented this morning. No pain. Palpable graft pulse. Unclear as to etiology of the diaphoresis and fever last night. He has done similar episodes of prior admissions as well. Continue to mobilize  EARLY, [name], MD 10/04/2012 12:01 PM

## 2012-10-04 NOTE — Progress Notes (Signed)
PHARMACY FOLLOW UP NOTE  Pharmacy Consult for : Coumadin with Heparin bridging  Indication:  atrial fibrillation and prevention of femoral bypass graft occlusion  Labs:  Recent Labs  10/02/12 0505 10/02/12 0515  10/03/12 0345 10/03/12 2230 10/04/12 0953  HGB  --   --   < > 10.0* 10.7* 9.5*  HCT  --   --   --  29.4* 33.0* 28.9*  PLT  --   --   --  228 256 194  APTT 42*  --   --  47*  --  46*  LABPROT  --  16.4*  --  17.2*  --  25.8*  INR  --  1.36  --  1.44  --  2.45*  CREATININE 2.80*  --   --   --   --  2.50*  < > = values in this interval not displayed. Lab Results  Component Value Date   INR 2.45* 10/04/2012   INR 1.44 10/03/2012   INR 1.36 10/02/2012    Medications:  Medication Sig  . warfarin (COUMADIN) 2.5 MG tablet Take 2.5 mg by mouth See admin instructions. Take 2.5mg  daily except no coumadin on Thursdays   Scheduled:  . carvedilol  6.25 mg Oral BID WC  . Chlorhexidine Gluconate Cloth  6 each Topical Daily  . docusate sodium  100 mg Oral Daily  . DULoxetine  30 mg Oral Daily  . finasteride  5 mg Oral Daily  . furosemide  40 mg Oral Daily  . gabapentin  300 mg Oral TID  . insulin aspart  0-15 Units Subcutaneous TID WC  . insulin aspart protamine- aspart  10 Units Subcutaneous BID WC  . mupirocin ointment  1 application Nasal BID  . pantoprazole  40 mg Oral Daily  . Warfarin - Pharmacist Dosing Inpatient   Does not apply q1800   Infusions:  . HEPARIN 800 Units/hr (10/02/12 1800)   Assessment:  78 y/o male patient s/p femoral-anterior tibial artery bypass, on chronic coumadin for h/o afib. Receiving heparin gtt for anticoag while INR subtherapeutic. INR therapeutic today and large increase. Ok to d/c heparin per MD. Will give smaller coumadin dose today.  Goal of Therapy:  INR 2-3   Plan: D/c heparin Coumadin 1mg  today and f/u daily protime.  Verlene Mayer, PharmD, BCPS Pager [phone]/05/2012, 2:34 PM

## 2012-10-04 NOTE — Progress Notes (Signed)
DR. EARLY CALL AND MADE AWARE OF V.S. LABS AND URINE OUTPUT AND THAT PATIENT REMAINS DIA PHORIC.  NO ORDERS GIVEN AT THIS TIME. CONT. TO MONITOR PATIENT AND V.S.

## 2012-10-04 NOTE — Progress Notes (Signed)
WENT INTO ASSESS PATIENT AND HE HAD CHILLS  AND WAS VERY HOT TO TOUCH, CONFUSED HE THOUGHT HE WAS IN A DRESSING ROOM AND IT WAS  July 1945, AND THE PRESIDENT WAS REAGAN.VERY WEAK AND SOMETIMES WOULD NOT ANSWER QUESTION. BP HIGH AT 204/80 P. 106 V-PACING,TEMP. 102.7 INCONT OF URINE . R.N. AWARE AND INTO ASSESS PATIENT. RAPID RESPONSE R.N. Eather Colas AND WILL COME SEE PATIENT SOON. UNABLE TO GET RM  AIR SATS . APPLIED 02 AT 2L/M [place] 94-100% DR. EARLY PAGED AT 20:20 20:30 20:40  VIA HIS ANSWERING SERVICE. AWAITING RETURN CALL . CALLED HIM DIRECTLY ON HIS PAGED AND CALL RETURNED AND WILL COME SEE PATIENT.LOPRESSOR 5MG  IV GIVEN SLOWLY FOR HIGH BP AND 2 TYLENOL P.O. WAS GIVEN FOR FEVER. DR. EARLY AWARE OF ALL. SEE V.S. FLOW SHEET. B.P. GRAD. COMING DOWN. TEMP 1 HR. LATER UP 102.8 BUT PATIENT VERY DIAPHORETIC. CONT. TO MONITOR PATIENT AND V.S.

## 2012-10-05 ENCOUNTER — Ambulatory Visit: Payer: Medicare Other

## 2012-10-05 LAB — GLUCOSE, CAPILLARY
Glucose-Capillary: 151 mg/dL — ABNORMAL HIGH (ref 70–99)
Glucose-Capillary: 154 mg/dL — ABNORMAL HIGH (ref 70–99)

## 2012-10-05 LAB — CBC
HCT: 28.6 % — ABNORMAL LOW (ref 39.0–52.0)
MCHC: 33.6 g/dL (ref 30.0–36.0)
MCV: 83.9 fL (ref 78.0–100.0)
Platelets: 184 10*3/uL (ref 150–400)
RDW: 14.9 % (ref 11.5–15.5)
WBC: 7.4 10*3/uL (ref 4.0–10.5)

## 2012-10-05 LAB — BASIC METABOLIC PANEL
BUN: 50 mg/dL — ABNORMAL HIGH (ref 6–23)
Calcium: 8.6 mg/dL (ref 8.4–10.5)
Creatinine, Ser: 2.4 mg/dL — ABNORMAL HIGH (ref 0.50–1.35)
GFR calc Af Amer: 29 mL/min — ABNORMAL LOW (ref >90)
GFR calc non Af Amer: 25 mL/min — ABNORMAL LOW (ref >90)

## 2012-10-05 LAB — PROTIME-INR: INR: 2.39 — ABNORMAL HIGH (ref 0.00–1.49)

## 2012-10-05 MED ORDER — FLEET ENEMA 7-19 GM/118ML RE ENEM
1.0000 | ENEMA | Freq: Once | RECTAL | Status: AC
  Administered 2012-10-05: 15:00:00 via RECTAL
  Filled 2012-10-05: qty 1

## 2012-10-05 MED ORDER — BISACODYL 10 MG RE SUPP
10.0000 mg | Freq: Once | RECTAL | Status: AC
  Administered 2012-10-05 (×2): 10 mg via RECTAL
  Filled 2012-10-05 (×2): qty 1

## 2012-10-05 MED ORDER — WARFARIN SODIUM 2.5 MG PO TABS
2.5000 mg | ORAL_TABLET | Freq: Once | ORAL | Status: AC
  Administered 2012-10-05: 2.5 mg via ORAL
  Filled 2012-10-05: qty 1

## 2012-10-05 NOTE — Progress Notes (Signed)
Physical Therapy Treatment Patient Details Name: [name] MRN: [id number] DOB: 08-Jul-1934 Today's Date: 10/05/2012 Time: 4098-1191 PT Time Calculation (min): 14 min  PT Assessment / Plan / Recommendation  History of Present Illness Pt s/p LLE bypass graft. Pt with history of multiple bypass grafts.   PT Comments   Pt demonstrates ability to ambulate without physical assist using SPC. Spoke with patient regarding technique for car transfers and mobility expectations for discharge. Patient with better activity tolerance in LLE today but limited overall secondary to constipation. Will continue to see patient as indicated, continue to rec dc home with no PT follow up at this time.   Follow Up Recommendations  No PT follow up           Equipment Recommendations  None recommended by PT    Recommendations for Other Services    Frequency Min 3X/week   Progress towards PT Goals    Plan Current plan remains appropriate    Precautions / Restrictions Precautions Precautions: None Restrictions Weight Bearing Restrictions: No   Pertinent Vitals/Pain 2/10    Mobility  Bed Mobility Bed Mobility: Supine to Sit;Sitting - Scoot to Edge of Bed Supine to Sit: 6: Modified independent (Device/Increase time) Sitting - Scoot to Edge of Bed: 6: Modified independent (Device/Increase time) Sit to Supine: 6: Modified independent (Device/Increase time);HOB flat Details for Bed Mobility Assistance: Patient wished to remain sitting EOb after session. bed mobility supervision, no physical assist needed Transfers Transfers: Sit to Stand;Stand to Sit Sit to Stand: 5: Supervision;From bed;With upper extremity assist;From chair/3-in-1;From toilet Stand to Sit: 5: Supervision;To bed;With upper extremity assist;To chair/3-in-1;To toilet Details for Transfer Assistance: no physical assist needed, upon standing used cane for ambulation and stability Ambulation/Gait Ambulation/Gait Assistance: 5:  Supervision Ambulation Distance (Feet): 30 Feet Assistive device: Straight cane;Rolling walker Ambulation/Gait Assistance Details: pt still insists on use of cane, limited ambulation secondary to abdominal pain from constipation, in room ambulation with cane and no significant LOB Gait Pattern: Step-to pattern;Decreased stance time - left;Left flexed knee in stance Gait velocity: decr     PT Goals (current goals can now be found in the care plan section) Acute Rehab PT Goals Patient Stated Goal: to return home PT Goal Formulation: With patient Time For Goal Achievement: 10/05/12 Potential to Achieve Goals: Good  Visit Information  Last PT Received On: 10/05/12 Assistance Needed: +1 History of Present Illness: Pt s/p LLE bypass graft. Pt with history of multiple bypass grafts.    Subjective Data  Subjective: I really just want to be able to use the bathroom Patient Stated Goal: to return home   Cognition  Cognition Arousal/Alertness: Awake/alert Behavior During Therapy: WFL for tasks assessed/performed Overall Cognitive Status: Within Functional Limits for tasks assessed    Balance  Balance Balance Assessed: Yes Static Standing Balance Static Standing - Balance Support: Bilateral upper extremity supported Static Standing - Level of Assistance: 6: Modified independent (Device/Increase time) Dynamic Standing Balance Dynamic Standing - Balance Support: No upper extremity supported;During functional activity Dynamic Standing - Level of Assistance: 5: Stand by assistance  End of Session PT - End of Session Activity Tolerance: Patient tolerated treatment well Patient left: in bed;with call bell/phone within reach (sitting EOB) Nurse Communication: Other (comment) (pt is increased discomfort from constipation)   GP     Fabio Asa 10/05/2012, 3:20 PM Charlotte Crumb, PT DPT  [phone]

## 2012-10-05 NOTE — Progress Notes (Signed)
ANTICOAGULATION CONSULT NOTE - Follow Up Consult  Pharmacy Consult for Coumadin Indication: atrial fibrillation and prevention of femoral bypass graft occlusion  Allergies  Allergen Reactions  . Other Other (See Comments)    Plastic tape tears skin    Patient Measurements: Height: 5\' 8"  (172.7 cm) Weight: 189 lb 4.8 oz (85.866 kg) IBW/kg (Calculated) : 68.4  Vital Signs: Temp: 99.1 F (37.3 C) (08/04 0407) Temp src: Oral (08/04 0407) BP: 124/72 mmHg (08/04 0407) Pulse Rate: 69 (08/04 0407)  Labs:  Recent Labs  10/03/12 0345 10/03/12 2230 10/04/12 0953 10/05/12 0543  HGB 10.0* 10.7* 9.5* 9.6*  HCT 29.4* 33.0* 28.9* 28.6*  PLT 228 256 194 184  APTT 47*  --  46*  --   LABPROT 17.2*  --  25.8* 25.3*  INR 1.44  --  2.45* 2.39*  CREATININE  --   --  2.50* 2.40*    Estimated Creatinine Clearance: 27.9 ml/min (by C-G formula based on Cr of 2.4).  Assessment: 78yom POD#5 redo left femoral-anterior tibial artery bypass graft continues on coumadin (heparin d/c'ed 8/3). INR is therapeutic today at 2.39. Hgb low but stable at 9.6, plts slowly decreasing - will watch. No bleeding reported.  Home dose: 2.5mg  daily except none on Thursday  Goal of Therapy:  INR 2-3 Monitor platelets by anticoagulation protocol: Yes   Plan:  1) Coumadin 2.5mg  x 1 (try to resume home dose) 2) Follow up INR, CBC in AM  Fredrik Rigger 10/05/2012,8:51 AM

## 2012-10-05 NOTE — Progress Notes (Signed)
Occupational Therapy Treatment Patient Details Name: [name] MRN: [id number] DOB: [birth year] Today's Date: 10/05/2012 Time:  -     OT Assessment / Plan / Recommendation  History of present illness Pt s/p LLE bypass graft. Pt with history of multiple bypass grafts.   OT comments  Pt making progress and is pleasant and cooperative. Pt states that he is constipated and does not feel good, but agreeable  Follow Up Recommendations  No OT follow up;Supervision - Intermittent    Barriers to Discharge   none    Equipment Recommendations  None recommended by OT    Recommendations for Other Services    Frequency Min 2X/week   Progress towards OT Goals Progress towards OT goals: Progressing toward goals  Plan Discharge plan remains appropriate    Precautions / Restrictions Precautions Precautions: None Restrictions Weight Bearing Restrictions: No   Pertinent Vitals/Pain 4/10 L LE/foot soreness    ADL  Grooming: Performed;Shaving;Supervision/safety Where Assessed - Grooming: Unsupported standing Lower Body Dressing: Minimal assistance;Min guard Where Assessed - Lower Body Dressing: Unsupported sitting;Supported sit to stand Toilet Transfer: Performed;Supervision/safety Toilet Transfer Method: Sit to Barista: Regular height toilet;Grab bars Toileting - Clothing Manipulation and Hygiene: Performed;Modified independent Where Assessed - Toileting Clothing Manipulation and Hygiene: Standing Tub/Shower Transfer: Performed;Min guard Tub/Shower Transfer Method: Science writer: Grab bars;Walk in shower Equipment Used: Gilmer Mor ADL Comments: Incr time for all tasks due to L LE soreness. Pt sat EOB for ADL tasks, sit - stand. Pt states he feels "blah" due to constipation    OT Diagnosis:    OT Problem List:   OT Treatment Interventions:     OT Goals(current goals can now be found in the care plan section) Acute Rehab OT  Goals Patient Stated Goal: to return home  Visit Information  Last OT Received On: 10/05/12 History of Present Illness: Pt s/p LLE bypass graft. Pt with history of multiple bypass grafts.    Subjective Data      Prior Functioning       Cognition  Cognition Arousal/Alertness: Awake/alert Behavior During Therapy: WFL for tasks assessed/performed Overall Cognitive Status: Within Functional Limits for tasks assessed    Mobility  Bed Mobility Bed Mobility: Supine to Sit;Sitting - Scoot to Edge of Bed Supine to Sit: 6: Modified independent (Device/Increase time) Sitting - Scoot to Edge of Bed: 6: Modified independent (Device/Increase time) Sit to Supine: 6: Modified independent (Device/Increase time);HOB flat Transfers Transfers: Sit to Stand;Stand to Sit Sit to Stand: 5: Supervision;From bed;With upper extremity assist;From chair/3-in-1;From toilet Stand to Sit: 5: Supervision;To bed;With upper extremity assist;To chair/3-in-1;To toilet Details for Transfer Assistance: Increased time to perform    Exercises      Balance Balance Balance Assessed: Yes Dynamic Standing Balance Dynamic Standing - Balance Support: No upper extremity supported;During functional activity Dynamic Standing - Level of Assistance: 5: Stand by assistance   End of Session OT - End of Session Equipment Utilized During Treatment: Other (comment) (cane) Activity Tolerance: Patient tolerated treatment well Patient left: in bed;with call bell/phone within reach  GO     [name] 10/05/2012, 12:04 PM

## 2012-10-05 NOTE — Progress Notes (Addendum)
[facility] Progress Note  10/05/2012 7:23 AM 5 Days Post-Op  Subjective:  States he doesn't feel bad; needs to have a BM as he has not had one since surgery.  Denies productive cough.  Tm 101.4 now 99.1 Otherwise, VSS 97% RA  Filed Vitals:   10/05/12 0407  BP: 124/72  Pulse: 69  Temp: 99.1 F (37.3 C)  Resp: 20    Physical Exam: Incisions:  Left groin incision is c/d/i; below knee incision is c/d/i.  All left arm incisions are healing nicely.  All incisions are without evidence of infection. Extremities:  Palpable graft pulse on lateral aspect of leg.  Right foot is warm. Lungs:  CTAB;  Abdomen:  Soft NT/ND +BS; -BM Cardiac:  RRR  CBC    Component Value Date/Time   WBC 7.4 10/05/2012 0543   RBC 3.41* 10/05/2012 0543   HGB 9.6* 10/05/2012 0543   HCT 28.6* 10/05/2012 0543   PLT 184 10/05/2012 0543   MCV 83.9 10/05/2012 0543   MCH 28.2 10/05/2012 0543   MCHC 33.6 10/05/2012 0543   RDW 14.9 10/05/2012 0543   LYMPHSABS 1.4 07/10/2012 2020   MONOABS 1.0 07/10/2012 2020   EOSABS 0.1 07/10/2012 2020   BASOSABS 0.0 07/10/2012 2020    BMET    Component Value Date/Time   NA 134* 10/05/2012 0543   K 4.7 10/05/2012 0543   CL 103 10/05/2012 0543   CO2 19 10/05/2012 0543   GLUCOSE 98 10/05/2012 0543   BUN 50* 10/05/2012 0543   CREATININE 2.40* 10/05/2012 0543   CREATININE 2.68* 09/07/2012 0812   CALCIUM 8.6 10/05/2012 0543   GFRNONAA 25* 10/05/2012 0543   GFRAA 29* 10/05/2012 0543    INR    Component Value Date/Time   INR 2.39* 10/05/2012 0543   INR 2.1 09/07/2012 0856     Intake/Output Summary (Last 24 hours) at 10/05/12 0723 Last data filed at 10/05/12 0600  Gross per 24 hour  Intake    600 ml  Output   2075 ml  Net  -1475 ml     Assessment/Plan:  78 y.o. male is s/p:  REDO LEFT FEMORAL-ANTERIOR TIBIAL ARTERY BYPASS USING COMPOSITE CEPHALIC AND BASILIC VEIN GRAFT FROM LEFT ARM   5 Days Post-Op  -pt still with fever of 101.4 now 99.1-pt blood culture is pending -WBC continues to  be normal -BUN/Cr stable with good UOP -DVT prophylaxis:  Coumadin. -INR is 2.39 today-heparin is off -dulcolax supp this am.   Doreatha Massed, PA-C [facility] [phone] 10/05/2012 7:23 AM    I have examined the patient, reviewed and agree with above.DC home when afeb   Sondi Desch, MD 10/05/2012 10:09 AM

## 2012-10-06 ENCOUNTER — Telehealth: Payer: Self-pay

## 2012-10-06 LAB — PROTIME-INR
INR: 2.68 — ABNORMAL HIGH (ref 0.00–1.49)
Prothrombin Time: 27.6 seconds — ABNORMAL HIGH (ref 11.6–15.2)

## 2012-10-06 LAB — CBC
Hemoglobin: 9.2 g/dL — ABNORMAL LOW (ref 13.0–17.0)
MCH: 27.3 pg (ref 26.0–34.0)
Platelets: 182 10*3/uL (ref 150–400)
RBC: 3.37 MIL/uL — ABNORMAL LOW (ref 4.22–5.81)
WBC: 7.3 10*3/uL (ref 4.0–10.5)

## 2012-10-06 LAB — GLUCOSE, CAPILLARY: Glucose-Capillary: 108 mg/dL — ABNORMAL HIGH (ref 70–99)

## 2012-10-06 MED ORDER — WARFARIN SODIUM 2.5 MG PO TABS: 2.5000 mg | ORAL_TABLET | ORAL | Status: DC

## 2012-10-06 MED ORDER — ATORVASTATIN CALCIUM 10 MG PO TABS: 10.0000 mg | ORAL_TABLET | Freq: Every day | ORAL | Status: DC

## 2012-10-06 MED ORDER — OXYCODONE-ACETAMINOPHEN 5-325 MG PO TABS: 1.0000 | ORAL_TABLET | ORAL | Status: DC | PRN

## 2012-10-06 NOTE — Discharge Summary (Signed)
[facility] Discharge Summary  [name] 1934/11/10 78 y.o. 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 male returns today with known occlusion of his attempted left femoral popliteal Gore-Tex bypass last week. He has very poor veins for conduits and we attempted a below knee femoral-popliteal Gore-Tex graft into a very disadvantaged runoff situation. This was unsuccessful occluding about 36 hours later. His only option now is a bypass into one vessel runoff anterior tibial artery in the mid left lower leg. He is experiencing rest pain at night but has developed no infection or cellulitis.  Hospital Course:  The patient was admitted to the hospital and taken to the operating room on 09/30/2012 and underwent: REDO LEFT FEMORAL-ANTERIOR TIBIAL ARTERY BYPASS USING COMPOSITE CEPHALIC AND BASILIC VEIN GRAFT FROM LEFT ARM    The pt tolerated the procedure well and was transported to the PACU in good condition. By POD 1, he was doing well.  He was continued on heparin for extra protection of his bypass not clotting.  He was subsequently bridged to coumadin and INR therapeutic at discharge.  He does have a heel ulcer on his right heel and we explained to pt how crucial it is to float his heels.    A DM coordinator consult was obtained and he does have good glycemic control.  ABI's post operatively on 10/01/12 are as follows:  RIGHT    LEFT     PRESSURE  WAVEFORM   PRESSURE  WAVEFORM   BRACHIAL  131  Triphasic  BRACHIAL  Restricted limb due to surgical incisions    DP  73  Monophasic  DP  76  Monophasic   PT  56  Monophasic  PT  60  Monophasic     RIGHT  LEFT   ABI  0.56  0.58    Postop ABI only 0.58 in left foot which is probably consistent with caliber of arm vein.  On the night of POD 3, pt  did have a fever of 103, he was confused, and hypertensive and diaphoretic.  His O2 saturations remained greater than 90.   A u/a was obtained and was negative.  His CXR was unremarkable and to date, blood cultures are negative.  All of his incisions are healing nicely and there is no evidence of infection.  His WBC has remained normal.  Pt has remained afebrile the last 24 hrs before discharge.  H  His INR is 2.68.  He will be instructed to not take his coumadin today and resume home dose 06/07/12 and have INR checked with Dr. Clent Ridges on 06/08/12.  Pt states that he has had similar episodes during prior admissions, but nothing has ever come of these episodes.   According to the GFR, pt does have CKD stage 4.  The remainder of the hospital course consisted of increasing mobilization and increasing intake of solids without difficulty.  CBC    Component Value Date/Time   WBC 7.3 10/06/2012 0500   RBC 3.37* 10/06/2012 0500   HGB 9.2* 10/06/2012 0500   HCT 28.3* 10/06/2012 0500   PLT 182 10/06/2012 0500   MCV 84.0 10/06/2012 0500   MCH 27.3 10/06/2012 0500   MCHC 32.5 10/06/2012 0500   RDW 14.8 10/06/2012 0500   LYMPHSABS 1.4 07/10/2012 2020  MONOABS 1.0 07/10/2012 2020   EOSABS 0.1 07/10/2012 2020   BASOSABS 0.0 07/10/2012 2020    BMET    Component Value Date/Time   NA 134* 10/05/2012 0543   K 4.7 10/05/2012 0543   CL 103 10/05/2012 0543   CO2 19 10/05/2012 0543   GLUCOSE 98 10/05/2012 0543   BUN 50* 10/05/2012 0543   CREATININE 2.40* 10/05/2012 0543   CREATININE 2.68* 09/07/2012 0812   CALCIUM 8.6 10/05/2012 0543   GFRNONAA 25* 10/05/2012 0543   GFRAA 29* 10/05/2012 0543     Discharge Instructions:   The patient is discharged to home with extensive instructions on wound care and progressive ambulation.  They are instructed not to drive or perform any heavy lifting until returning to see the physician in his office.    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 Subclavian artery stenosis 09/08/2012  . Peripheral vascular disease, unspecified 08/18/2012  . Fever 07/08/2012  . Leg edema 06/02/2012  . Cardiomyopathy, ischemic 05/18/2012  . S/P ICD (internal cardiac defibrillator) procedure 05/18/2012  . Diabetes 05/18/2012  . Pain in limb 02/07/2012  . Atherosclerosis of native arteries of the extremities with intermittent claudication 12/17/2011  . Atherosclerotic PVD with intermittent claudication 10/15/2011  . Type I (juvenile type) diabetes mellitus with peripheral circulatory disorders, uncontrolled 09/06/2011  . PAD (peripheral artery disease) 09/04/2011  . CAD (coronary artery disease)   . Burning sensation of feet 05/10/2011  . Bursitis of right hip 05/10/2011  . Cervicalgia 05/10/2011  . Balance disorder 05/10/2011  . Neurogenic claudication 05/10/2011  . Lumbago 05/10/2011  . Chronic systolic congestive heart failure 07/06/2010  . ECZEMA 01/17/2010  . RENAL INSUFFICIENCY 11/01/2009  . UTI 11/01/2009  . CELLULITIS, LEG, RIGHT 08/22/2009  . LYMPHADENOPATHY, REACTIVE 08/22/2009  . PERIPHERAL NEUROPATHY 01/24/2009  . DIZZINESS 12/29/2008  . ABDOMINAL PAIN, GENERALIZED 08/22/2008  . GOUT 09/18/2007  . CORONARY ARTERY DISEASE 09/18/2007  . HYPERLIPIDEMIA 01/23/2007  . HYPERTENSION 01/23/2007  . Atrial fibrillation 01/23/2007  . BENIGN PROSTATIC HYPERTROPHY 01/23/2007  . LATERAL EPICONDYLITIS 01/23/2007   Past Medical History  Diagnosis Date  . Hyperlipidemia   . Gout   . Benign prostatic hypertrophy   . Atrial fibrillation     sees Dr. Verdis Prime  . Chronic systolic dysfunction of left ventricle     a. mixed ischemic and nonischemic CM,  EF 35%. b. s/p AICD implantation.  . CHF (congestive heart failure)   . ED (erectile dysfunction)   . Arthritis   . Pacemaker     medtronic  . CAD (coronary artery disease)     a. s/p mid LAD stenting with DES 2008 Dr.  Verdis Prime  . ICD (implantable cardiac defibrillator) in place     medtronic, Dr. Johney Frame  . ICD (implantable cardiac defibrillator) in place     due for check in Feb/2013  . Sleep apnea     hx of "had surgery for"  . Hypertension     sees Dr. Claris Che  . Type II diabetes mellitus   . Automatic implantable cardioverter-defibrillator in situ   . Depression   . Numbness and tingling     Hx; 45f left foot  . PAD (peripheral artery disease)     Severe by PV angiogram 09/2011  . Renal artery stenosis     s/p stenting 2009  . PAD (peripheral  artery disease)     s/p multiple LLE bypass grafts; left mid-distal SCA occlusion by 08/2012 duplex     Medication List         atorvastatin 10 MG tablet  Commonly known as:  LIPITOR  Take 1 tablet (10 mg total) by mouth daily.     carvedilol 6.25 MG tablet  Commonly known as:  COREG  Take 1 tablet (6.25 mg total) by mouth 2 (two) times daily with a meal.     DULoxetine 30 MG capsule  Commonly known as:  CYMBALTA  Take 1 capsule (30 mg total) by mouth daily.     finasteride 5 MG tablet  Commonly known as:  PROSCAR  Take 5 mg by mouth daily.     furosemide 40 MG tablet  Commonly known as:  LASIX  Take 1 tablet (40 mg total) by mouth daily.     gabapentin 300 MG capsule  Commonly known as:  NEURONTIN  Take 300 mg by mouth 3 (three) times daily.     insulin aspart protamine- aspart (70-30) 100 UNIT/ML injection  Commonly known as:  NOVOLOG MIX 70/30  Inject 10 Units into the skin 2 (two) times daily with a meal. 10 units with breakfast, and 10 units with the evening meal     losartan 100 MG tablet  Commonly known as:  COZAAR  Take 1 tablet (100 mg total) by mouth daily.     NITROSTAT 0.4 MG SL tablet  Generic drug:  nitroGLYCERIN  Place 0.4 mg under the tongue every 5 (five) minutes as needed for chest pain. For chest pain, max 3 doses     oxyCODONE-acetaminophen 5-325 MG per tablet  Commonly known as:  PERCOCET/ROXICET  Take 1-2  tablets by mouth every 4 (four) hours as needed.     warfarin 2.5 MG tablet  Commonly known as:  COUMADIN  Take 1 tablet (2.5 mg total) by mouth See admin instructions. Take 2.5mg  daily except no coumadin on Thursdays  Start taking on:  10/07/2012         Percocet #30 No Refill Rx for lipitor sent electronically to pt's pharmacy  Disposition: home  Patient's condition: is Good  Follow up: 1. Dr. Hart Rochester in 2 weeks 2. Dr. Clent Ridges on 10/08/12 with INR check   Doreatha Massed, PA-C [facility] [phone] 10/06/2012  8:05 AM  - For VQI Registry use --- Instructions: Press F2 to tab through selections.  Delete question if not applicable.   Post-op:  Wound infection: No  Graft infection: No  Transfusion: No  If yes, n/a units given New Arrhythmia: No Ipsilateral amputation: No, [ ]  Minor, [ ]  BKA, [ ]  AKA Discharge patency: [x ] Primary, [ ]  Primary assisted, [ ]  Secondary, [ ]  Occluded Patency judged by: [ x] Dopper only, [ ]  Palpable graft pulse, [ ]  Palpable distal pulse, [ ]  ABI inc. > 0.15, [ ]  Duplex Discharge ABI: , Right:  0.56  Left 0.58 Discharge TBI: R n/a, L n/a D/C Ambulatory Status: Ambulatory  Complications: MI: No, [ ]  Troponin only, [ ]  EKG or Clinical CHF: No Resp failure:No, [ ]  Pneumonia, [ ]  Ventilator Chg in renal function: No, [ ]  Inc. Cr > 0.5, [ ]  Temp. Dialysis, [ ]  Permanent dialysis Stroke: No, [ ]  Minor, [ ]  Major Return to OR: No  Reason for return to OR: [ ]  Bleeding, [ ]  Infection, [ ]  Thrombosis, [ ]  Revision  Discharge medications: Statin use:  yes ASA use:  No  for medical reason on coumadin Plavix use:  No  for medical reason pt on coumadin Beta blocker use: Yes Coumadin use: Yes

## 2012-10-06 NOTE — Care Management Note (Signed)
    Page 1 of 1   10/06/2012     4:38:07 PM   CARE MANAGEMENT NOTE 10/06/2012  Patient:  [name], [name]   Account Number:  [id number]  Date Initiated:  10/01/2012  Documentation initiated by:  Donn Pierini  Subjective/Objective Assessment:   Pt admitted s/p REDO LEFT FEMORAL-ANTERIOR TIBIAL ARTERY BYPASS     Action/Plan:   PTA pt lived at home with spouse- PT/OT evals ordered/pending   Anticipated DC Date:  10/03/2012   Anticipated DC Plan:  HOME/SELF CARE      DC Planning Services  CM consult      Choice offered to / List presented to:             Status of service:  Completed, signed off Medicare Important Message given?   (If response is "NO", the following Medicare IM given date fields will be blank) Date Medicare IM given:   Date Additional Medicare IM given:    Discharge Disposition:  HOME/SELF CARE  Per UR Regulation:  Reviewed for med. necessity/level of care/duration of stay  If discussed at Long Length of Stay Meetings, dates discussed:    Comments:  10/06/12 Rosalita Chessman [phone] PT/OT RECOMMEND NO HOME FOLLOW UP.  PT AMBULATING WELL WITH CANE.  NO HOME NEEDS IDENTIFIED.

## 2012-10-06 NOTE — Telephone Encounter (Addendum)
Pt's. significant other called to report pt. was discharged from the hospital today, and started running a temperature.  Reports pt. has "severe chills";  had temperature 100.2,  approx. 1 hour. ago.  Gave pt. 2 reg. strength Aspirin.   Now reports fever is up to 101.8.  Denies any cough, difficulty urinating, or new symptoms.  Denies any increased pain.  Reports pt. was feverish in the hospital on Sat. and Sun.  Discussed with Dr. Arbie Cookey.  Stated he saw the patient in the hospital this AM.  Recommended since the CXR was clear and the WBC is normal, to give pt. Tylenol for fever.  If no improvement in symptoms then should take pt. To ER.  Notified pt's significant other, Alita, of Dr. Bosie Helper recommendation for Tylenol, and to take to ER if no improvement.  Verb. concern that pt. has become delirious in the past when his temperature got as high as 102.0.   Verb. understanding of instructions.

## 2012-10-06 NOTE — Progress Notes (Addendum)
[facility] Progress Note  10/06/2012 7:51 AM 6 Days Post-Op  Subjective:  No complaints this am except the dressing on his heel is not working well with walking  Afebrile x 24 hrs VSS 99% RA  Filed Vitals:   10/06/12 0501  BP: 140/54  Pulse: 69  Temp: 98.1 F (36.7 C)  Resp:     Physical Exam: Incisions:  LUA incisions are c/d/i; left groin wound is healing nicely without evidence of hematoma or infection. Extremities:  + graft pulse laterally; left foot is warm.   CBC    Component Value Date/Time   WBC 7.3 10/06/2012 0500   RBC 3.37* 10/06/2012 0500   HGB 9.2* 10/06/2012 0500   HCT 28.3* 10/06/2012 0500   PLT 182 10/06/2012 0500   MCV 84.0 10/06/2012 0500   MCH 27.3 10/06/2012 0500   MCHC 32.5 10/06/2012 0500   RDW 14.8 10/06/2012 0500   LYMPHSABS 1.4 07/10/2012 2020   MONOABS 1.0 07/10/2012 2020   EOSABS 0.1 07/10/2012 2020   BASOSABS 0.0 07/10/2012 2020    BMET    Component Value Date/Time   NA 134* 10/05/2012 0543   K 4.7 10/05/2012 0543   CL 103 10/05/2012 0543   CO2 19 10/05/2012 0543   GLUCOSE 98 10/05/2012 0543   BUN 50* 10/05/2012 0543   CREATININE 2.40* 10/05/2012 0543   CREATININE 2.68* 09/07/2012 0812   CALCIUM 8.6 10/05/2012 0543   GFRNONAA 25* 10/05/2012 0543   GFRAA 29* 10/05/2012 0543    INR    Component Value Date/Time   INR 2.68* 10/06/2012 0500   INR 2.1 09/07/2012 0856     Intake/Output Summary (Last 24 hours) at 10/06/12 0751 Last data filed at 10/05/12 1700  Gross per 24 hour  Intake    240 ml  Output    500 ml  Net   -260 ml     Assessment/Plan:  78 y.o. male is s/p:  REDO LEFT FEMORAL-ANTERIOR TIBIAL ARTERY BYPASS USING COMPOSITE CEPHALIC AND BASILIC VEIN GRAFT FROM LEFT ARM   6 Days Post-Op  -doing well this am-he has had no fevers over night -graft is patent -DVT prophylaxis: coumadin is therapeutic with INR at 2.68.  Will have pt not take coumadin today and resume his regular dose tomorrow and have INR checked at Dr. Claris Che office on  Thursday. -discharge home today.  -according to his GFR, he has CKD stage 4.  Doreatha Massed, PA-C [facility] [phone] 10/06/2012 7:51 AM    I have examined the patient, reviewed and agree with above.  Beza Steppe, MD 10/06/2012 8:35 AM

## 2012-10-07 ENCOUNTER — Encounter (INDEPENDENT_AMBULATORY_CARE_PROVIDER_SITE_OTHER): Payer: Medicare Other | Admitting: *Deleted

## 2012-10-07 ENCOUNTER — Other Ambulatory Visit: Payer: Self-pay | Admitting: Endocrinology

## 2012-10-07 ENCOUNTER — Other Ambulatory Visit: Payer: Self-pay | Admitting: *Deleted

## 2012-10-07 ENCOUNTER — Ambulatory Visit (INDEPENDENT_AMBULATORY_CARE_PROVIDER_SITE_OTHER): Payer: Medicare Other | Admitting: Vascular Surgery

## 2012-10-07 ENCOUNTER — Encounter: Payer: Self-pay | Admitting: Vascular Surgery

## 2012-10-07 ENCOUNTER — Telehealth: Payer: Self-pay

## 2012-10-07 VITALS — BP 116/51 | HR 78 | Resp 18 | Ht 68.0 in | Wt 180.0 lb

## 2012-10-07 DIAGNOSIS — I739 Peripheral vascular disease, unspecified: Secondary | ICD-10-CM

## 2012-10-07 DIAGNOSIS — M7989 Other specified soft tissue disorders: Secondary | ICD-10-CM

## 2012-10-07 MED ORDER — GLUCOSE BLOOD VI STRP: ORAL_STRIP | Status: DC

## 2012-10-07 NOTE — Telephone Encounter (Signed)
Had to resend rx for test strips.

## 2012-10-07 NOTE — Telephone Encounter (Signed)
Oletta, pt's significant other, called to report pt's fever increased to 102.2 at 2:00 AM  Reported giving pt. A total of 4 tabs reg str. Tylenol  and 2 tabs reg str. ASA over the course of 4 hrs.  States last temp at 5:30 AM was 99 degrees.  Reports that urine is a dark orange.  Denies any back pain, denies difficulty with urination ie: burning, urgency.  Denies any cough.  States pt. Is "very sensitive to touch".  Reports that the left leg is moderately swollen.  Advised to elevate the leg above the level of the heart.  Advised will report fever to the MD in office and call back with any recommendations. Verb. Understanding  Discussed with dr. Edilia Bo.  Advised pt. should be evaluated in the office for incision check and to have Venous duplex to rule out DVT.

## 2012-10-07 NOTE — Progress Notes (Signed)
Patient name: [name] MRN: [id number] DOB: Mar 03, 1935 Sex: male  REASON FOR VISIT: fever  HPI: [name] is a 78 y.o. male who underwent a redo left femoral to anterior tibial artery bypass graft with spliced arm vein from his left arm by Dr. Hart Rochester on 09/30/2012. He reports that he had been having some low-grade fevers in the hospital. He states that yesterday he had a fever as high as 102.2. He comes in to have a wound check. He denies any drainage from his wounds. He denies any productive cough. He denies any dysuria.  REVIEW OF SYSTEMS: Arly.Keller ] denotes positive finding; [  ] denotes negative finding  CARDIOVASCULAR:  [ ]  chest pain   [ ]  dyspnea on exertion    CONSTITUTIONAL:  [ ]  fever   [ ]  chills  PHYSICAL EXAM: Filed Vitals:   10/07/12 1645  BP: 116/51  Pulse: 78  Resp: 18  Height: 5\' 8"  (1.727 m)  Weight: 180 lb (81.647 kg)   Body mass index is 27.38 kg/(m^2). GENERAL: The patient is a well-nourished male, in no acute distress. The vital signs are documented above. CARDIOVASCULAR: There is a regular rate and rhythm  PULMONARY: There is good air exchange bilaterally without wheezing or rales. He has an easily palpable graft pulse along the lateral aspect of his left leg. His incisions in the left lower extremity and the left upper extremity looked fine. There is no erythema or drainage. He has moderate left lower extremity swelling.  Venous duplex scan in our office today shows no evidence of DVT.  MEDICAL ISSUES: Currently he's been afebrile today. I reassured him that he did not have any evidence of wound infection or DVT of the left lower extremity. If his fevers return he will follow up with his primary care physician for further workup. Otherwise, he will keep his regularly scheduled appointment with Dr. Hart Rochester.  DICKSON,CHRISTOPHER S [facility] [place] Beeper: [phone]

## 2012-10-08 ENCOUNTER — Ambulatory Visit (INDEPENDENT_AMBULATORY_CARE_PROVIDER_SITE_OTHER): Payer: Medicare Other | Admitting: General Practice

## 2012-10-08 DIAGNOSIS — Z7901 Long term (current) use of anticoagulants: Secondary | ICD-10-CM

## 2012-10-08 DIAGNOSIS — I4891 Unspecified atrial fibrillation: Secondary | ICD-10-CM

## 2012-10-08 LAB — POCT INR: INR: 4

## 2012-10-09 ENCOUNTER — Telehealth: Payer: Self-pay | Admitting: Family Medicine

## 2012-10-09 NOTE — Telephone Encounter (Signed)
RX request from [facility] fax # [phone] for diabetic supplies.

## 2012-10-10 LAB — CULTURE, BLOOD (SINGLE)

## 2012-10-12 NOTE — Telephone Encounter (Signed)
There was a few questions on this form to answer and I did give that to Dr. Clent Ridges.

## 2012-10-13 ENCOUNTER — Encounter: Payer: Self-pay | Admitting: Vascular Surgery

## 2012-10-13 ENCOUNTER — Ambulatory Visit (INDEPENDENT_AMBULATORY_CARE_PROVIDER_SITE_OTHER): Payer: Medicare Other | Admitting: Vascular Surgery

## 2012-10-13 VITALS — BP 160/75 | HR 74 | Temp 98.0°F | Resp 16 | Ht 68.0 in | Wt 182.0 lb

## 2012-10-13 DIAGNOSIS — I739 Peripheral vascular disease, unspecified: Secondary | ICD-10-CM

## 2012-10-13 DIAGNOSIS — M7989 Other specified soft tissue disorders: Secondary | ICD-10-CM

## 2012-10-13 DIAGNOSIS — Z48812 Encounter for surgical aftercare following surgery on the circulatory system: Secondary | ICD-10-CM

## 2012-10-13 NOTE — Progress Notes (Signed)
Subjective:     Patient ID: [name], male   DOB: [birth year], 78 y.o.   MRN: [id number]  HPI this 78 year old male is 2 weeks post redo left femoral anterior tibial bypass using composite left arm vein-cephalic and basilic. He has been having significant edema in the left leg. He was seen by Dr. Durwin Nora last week and DVT was ruled out with duplex scanning. He is not ambulating much. He has had some chills and fever since discharge but this has not exceeded 100 over the past several days.  Review of Systems     Objective:   Physical Exam BP 160/75  Pulse 74  Temp(Src) 98 F (36.7 C)  Resp 16  Ht 5\' 8"  (1.727 m)  Wt 182 lb (82.555 kg)  BMI 27.68 kg/m2  General well-developed well-nourished male in no apparent distress alert and oriented x3 Left upper extremity vein harvesting incisions healing nicely Left leg with nicely healing incision in groin and lateral calf area over anterior tibial artery. 3+ graft pulse palpable subcutaneously. 1-2+ edema below knee which is not pitting in nature. No active ulcers seen.      Assessment:     Doing well 2 weeks post left femoral to anterior tibial bypass graft using composite vein graft left arm with postoperative edema left leg-no evidence DVT on duplex scanning    Plan:     #1 elevate foot of bed 3-4 inches and elevate leg during the day #2 begin to ambulate outside-increase daily #3 return to see me in 3 weeks with duplex scan of bypass graft left leg and ABIs  #4 if patient developed shaking chills and fever he should see his medical doctor. Does not appear related to his surgery

## 2012-10-14 ENCOUNTER — Telehealth: Payer: Self-pay | Admitting: *Deleted

## 2012-10-14 NOTE — Addendum Note (Signed)
Addended by: Sharee Pimple on: 10/14/2012 08:44 AM   Modules accepted: Orders

## 2012-10-14 NOTE — Telephone Encounter (Signed)
Received call stating patient"s temperature was 102.8 at 4:30 and she had given patient tylenol. Stated that earlier today he had had chills. She states they had spoken with Dr Hart Rochester this morning to inform him of some incisional bleeding. Dr Hart Rochester per friend had instructed them to apply pressure. She states now he was still having slight bleeding but is concerned of the temperature. I suggested she put cool compresses on forehead, neck and underarms.   Referring to Dr Candie Chroman office visit note from 10/13/12 I instructed them to call PCP to notify him of elevated temps and if any fever,shaking chills or worsening symptoms occurred before then to proceed to the nearest ED.She understood the instructions and  agreed to do so.

## 2012-10-15 ENCOUNTER — Encounter: Payer: Self-pay | Admitting: Family

## 2012-10-15 ENCOUNTER — Ambulatory Visit (INDEPENDENT_AMBULATORY_CARE_PROVIDER_SITE_OTHER): Payer: Medicare Other | Admitting: Family

## 2012-10-15 VITALS — BP 108/64 | HR 82 | Temp 98.1°F | Wt 181.0 lb

## 2012-10-15 DIAGNOSIS — I739 Peripheral vascular disease, unspecified: Secondary | ICD-10-CM

## 2012-10-15 DIAGNOSIS — S31104A Unspecified open wound of abdominal wall, left lower quadrant without penetration into peritoneal cavity, initial encounter: Secondary | ICD-10-CM

## 2012-10-15 DIAGNOSIS — R509 Fever, unspecified: Secondary | ICD-10-CM

## 2012-10-15 DIAGNOSIS — S31109A Unspecified open wound of abdominal wall, unspecified quadrant without penetration into peritoneal cavity, initial encounter: Secondary | ICD-10-CM

## 2012-10-15 LAB — CBC WITH DIFFERENTIAL/PLATELET
Basophils Relative: 0 % (ref 0.0–3.0)
Eosinophils Relative: 0 % (ref 0.0–5.0)
Hemoglobin: 9.9 g/dL — ABNORMAL LOW (ref 13.0–17.0)
Lymphs Abs: 0.9 10*3/uL (ref 0.7–4.0)
MCHC: 32.1 g/dL (ref 30.0–36.0)
Monocytes Absolute: 0.9 10*3/uL (ref 0.1–1.0)
Neutro Abs: 12.4 10*3/uL — ABNORMAL HIGH (ref 1.4–7.7)
Platelets: 199 10*3/uL (ref 150.0–400.0)
RDW: 16.5 % — ABNORMAL HIGH (ref 11.5–14.6)

## 2012-10-15 LAB — BASIC METABOLIC PANEL
BUN: 41 mg/dL — ABNORMAL HIGH (ref 6–23)
Calcium: 8.4 mg/dL (ref 8.4–10.5)
GFR: 23.69 mL/min — ABNORMAL LOW (ref >60.00)
Glucose, Bld: 134 mg/dL — ABNORMAL HIGH (ref 70–99)

## 2012-10-15 NOTE — Patient Instructions (Addendum)
Fever   Fever is a higher-than-normal body temperature. A normal temperature varies with:   Age.   How it is measured (mouth, underarm, rectal, or ear).   Time of day.  In an adult, an oral temperature around 98.6 Fahrenheit (F) or 37 Celsius (C) is considered normal. A rise in temperature of about 1.8 F or 1 C is generally considered a fever (100.4 F or 38 C). In an infant age 76 days or less, a rectal temperature of 100.4 F (38 C) generally is regarded as fever. Fever is not a disease but can be a symptom of illness.  CAUSES    Fever is most commonly caused by infection.   Some non-infectious problems can cause fever. For example:   Some arthritis problems.   Problems with the thyroid or adrenal glands.   Immune system problems.   Some kinds of cancer.   A reaction to certain medicines.   Occasionally, the source of a fever cannot be determined. This is sometimes called a "Fever of Unknown Origin" (FUO).   Some situations may lead to a temporary rise in body temperature that may go away on its own. Examples are:   Childbirth.   Surgery.   Some situations may cause a rise in body temperature but these are not considered "true fever". Examples are:   Intense exercise.   Dehydration.   Exposure to high outside or room temperatures.  SYMPTOMS    Feeling warm or hot.   Fatigue or feeling exhausted.   Aching all over.   Chills.   Shivering.   Sweats.  DIAGNOSIS   A fever can be suspected by your caregiver feeling that your skin is unusually warm. The fever is confirmed by taking a temperature with a thermometer. Temperatures can be taken different ways. Some methods are accurate and some are not:  With adults, adolescents, and children:    An oral temperature is used most commonly.   An ear thermometer will only be accurate if it is positioned as recommended by the manufacturer.   Under the arm temperatures are not accurate and not recommended.   Most electronic thermometers are fast  and accurate.  Infants and Toddlers:   Rectal temperatures are recommended and most accurate.   Ear temperatures are not accurate in this age group and are not recommended.   Skin thermometers are not accurate.  RISKS AND COMPLICATIONS    During a fever, the body uses more oxygen, so a person with a fever may develop rapid breathing or shortness of breath. This can be dangerous especially in people with heart or lung disease.   The sweats that occur following a fever can cause dehydration.   High fever can cause seizures in infants and children.   Older persons can develop confusion during a fever.  TREATMENT    Medications may be used to control temperature.   Do not give aspirin to children with fevers. There is an association with Reye's syndrome. Reye's syndrome is a rare but potentially deadly disease.   If an infection is present and medications have been prescribed, take them as directed. Finish the full course of medications until they are gone.   Sponging or bathing with room-temperature water may help reduce body temperature. Do not use ice water or alcohol sponge baths.   Do not over-bundle children in blankets or heavy clothes.   Drinking adequate fluids during an illness with fever is important to prevent dehydration.  HOME CARE INSTRUCTIONS      For adults, rest and adequate fluid intake are important. Dress according to how you feel, but do not over-bundle.   Drink enough water and/or fluids to keep your urine clear or pale yellow.   For infants over 3 months and children, giving medication as directed by your caregiver to control fever can help with comfort. The amount to be given is based on the child's weight. Do NOT give more than is recommended.  SEEK MEDICAL CARE IF:    You or your child are unable to keep fluids down.   Vomiting or diarrhea develops.   You develop a skin rash.   An oral temperature above 102 F (38.9 C) develops, or a fever which persists for over 3  days.   You develop excessive weakness, dizziness, fainting or extreme thirst.   Fevers keep coming back after 3 days.  SEEK IMMEDIATE MEDICAL CARE IF:    Shortness of breath or trouble breathing develops   You pass out.   You feel you are making little or no urine.   New pain develops that was not there before (such as in the head, neck, chest, back, or abdomen).   You cannot hold down fluids.   Vomiting and diarrhea persist for more than a day or two.   You develop a stiff neck and/or your eyes become sensitive to light.   An unexplained temperature above 102 F (38.9 C) develops.  Document Released: 02/18/2005 Document Revised: 05/13/2011 Document Reviewed: 02/04/2008  [employer] Patient Information 2014 [employer].

## 2012-10-15 NOTE — Progress Notes (Signed)
Subjective:    Patient ID: [name], male    DOB: [birth year], 78 y.o.   MRN: [id number]  HPI  78 year old male, nonsmoker is in today with c/o fever has a history of PAD. He has had fever off and on x 10 days since after having vascular surgery up to 102.4. He has contacted vascular and they do not believe he has any vascular complications causing fever. Patient reports after having vascular surgery previously he reports running fever. He has a groin incision that is minimally bleeding but the wound in open. No foul discharge from the wound. Has been taking tylenol and ibuprofen that helps. Is currently off coumadin due to the bleed per vascular recommendation.   Review of Systems  Constitutional: Positive for fever, chills and appetite change.  HENT: Negative.   Respiratory: Negative.   Cardiovascular: Negative.   Gastrointestinal: Negative.   Genitourinary: Negative.   Musculoskeletal: Negative.   Skin: Positive for wound.       Open wound to the left groin after vascular surgery  Allergic/Immunologic: Negative.   Neurological: Negative.   Hematological: Negative.   Psychiatric/Behavioral: Negative.    Past Medical History  Diagnosis Date  . Hyperlipidemia   . Gout   . Benign prostatic hypertrophy   . Atrial fibrillation     sees Dr. Verdis Prime  . Chronic systolic dysfunction of left ventricle     a. mixed ischemic and nonischemic CM,  EF 35%. b. s/p AICD implantation.  . CHF (congestive heart failure)   . ED (erectile dysfunction)   . Arthritis   . Pacemaker     medtronic  . CAD (coronary artery disease)     a. s/p mid LAD stenting with DES 2008 Dr. Verdis Prime  . ICD (implantable cardiac defibrillator) in place     medtronic, Dr. Johney Frame  . ICD (implantable cardiac defibrillator) in place     due for check in Feb/2013  . Sleep apnea     hx of "had surgery for"  . Hypertension     sees Dr. Claris Che  . Type II diabetes mellitus   . Automatic implantable  cardioverter-defibrillator in situ   . Depression   . Numbness and tingling     Hx; 31f left foot  . PAD (peripheral artery disease)     Severe by PV angiogram 09/2011  . Renal artery stenosis     s/p stenting 2009  . PAD (peripheral artery disease)     s/p multiple LLE bypass grafts; left mid-distal SCA occlusion by 08/2012 duplex    History   Social History  . Marital Status: Widowed    Spouse Name: N/A    Number of Children: N/A  . Years of Education: N/A   Occupational History  . Retired    Social History Main Topics  . Smoking status: Never Smoker   . Smokeless tobacco: Never Used     Comment: 1-2 cigars when golfing  . Alcohol Use: 2.4 oz/week    2 Glasses of wine, 2 Shots of liquor per week     Comment: 07/10/2012 "galss of wine or vodka tonic 3 X/wk"  . Drug Use: No  . Sexual Activity: Not Currently   Other Topics Concern  . Not on file   Social History Narrative   Lives in [place] with significant other,  [name].    Past Surgical History  Procedure Laterality Date  . Turp vaporization    . Cardiac defibrillator placement  12/26/09    pacemaker combo  . Cervical epidural injection  2013  . Femoral-tibial bypass graft  09/25/2011    Procedure: BYPASS GRAFT FEMORAL-TIBIAL ARTERY;  Surgeon: Pryor Ochoa, MD;  Location: [facility] OR;  Service: Vascular;  Laterality: Left;  Left Femoral - Anterior Tibial Bypass;  saphenous vein graft from left leg  . Intraoperative arteriogram  09/25/2011    Procedure: INTRA OPERATIVE ARTERIOGRAM;  Surgeon: Pryor Ochoa, MD;  Location: [facility] OR;  Service: Vascular;  Laterality: Left;  . Femoral-tibial bypass graft  02/07/2012    Procedure: BYPASS GRAFT FEMORAL-TIBIAL ARTERY;  Surgeon: Larina Earthly, MD;  Location: [facility] OR;  Service: Vascular;  Laterality: Left;  Thrombectomy Left Femoral - Tibial Bypass Graft  . Embolectomy  02/07/2012    Procedure: EMBOLECTOMY;  Surgeon: Larina Earthly, MD;  Location: [facility] OR;  Service: Vascular;  Laterality:  Left;  . Femoral-tibial bypass graft  04/03/2012    Procedure: BYPASS GRAFT FEMORAL-TIBIAL ARTERY;  Surgeon: Pryor Ochoa, MD;  Location: [facility] OR;  Service: Vascular;  Laterality: Left;  Redo  . Insert / replace / remove pacemaker  2007  . Coronary angioplasty with stent placement  ~ 2000  . Coronary angioplasty    . Uvulopalatopharyngoplasty (uppp)/tonsillectomy/septoplasty  06/30/2003    [name] 06/30/2003 (07/10/2012)  . Shoulder open rotator cuff repair Right 2001    repair of lacerated right/notes 10/11/1999  (07/10/2012)  . Foot surgery Right 03/20/2001    "have plates and screws in; didn't break it" (07/10/2012)  . Carpal tunnel release Right 2002    [name] 03/20/2001 (07/10/2012)  . Biceps tendon repair Right 2001    [name] 03/20/2001 (07/10/2012)  . Renal artery stent  2009  . Femoral-popliteal bypass graft Left 09/16/2012    Procedure: LEFT FEMORAL-POPLITEAL BYPASS GRAFT WITH GORTEX Propaten Graft 6x80 Thin Wall and Left lower leg Angiogram;  Surgeon: Pryor Ochoa, MD;  Location: [facility] OR;  Service: Vascular;  Laterality: Left;  . Colonoscopy      Hx; of  . Tonsillectomy    . Adenoidectomy      Hx; of   . Femoral-tibial bypass graft Left 09/30/2012    Procedure: REDO LEFT FEMORAL-ANTERIOR TIBIAL ARTERY BYPASS USING COMPOSITE CEPHALIC AND BASILIC VEIN GRAFT FROM LEFT ARM;  Surgeon: Pryor Ochoa, MD;  Location: [facility] OR;  Service: Vascular;  Laterality: Left;    Family History  Problem Relation Age of Onset  . Cancer      breast/fhx  . Heart disease      fhx  . Diabetes Neg Hx   . Cancer Father     Allergies  Allergen Reactions  . Other Other (See Comments)    Plastic tape tears skin    Current Outpatient Prescriptions on File Prior to Visit  Medication Sig Dispense Refill  . atorvastatin (LIPITOR) 10 MG tablet Take 1 tablet (10 mg total) by mouth daily.  30 tablet  4  . carvedilol (COREG) 6.25 MG tablet Take 1 tablet (6.25 mg total) by mouth 2 (two) times daily with a meal.  180  tablet  3  . DULoxetine (CYMBALTA) 30 MG capsule Take 1 capsule (30 mg total) by mouth daily.  30 capsule  5  . finasteride (PROSCAR) 5 MG tablet Take 5 mg by mouth daily.      . furosemide (LASIX) 40 MG tablet Take 1 tablet (40 mg total) by mouth daily.  90 tablet  3  . gabapentin (NEURONTIN) 300 MG capsule Take 300  mg by mouth 3 (three) times daily.      Marland Kitchen glucose blood (ONE TOUCH ULTRA TEST) test strip Use to test blood sugar 2 times daily as instructed. Dx code: 250.73  100 each  3  . glucose blood test strip Use as instructed  100 each  4  . insulin aspart protamine-insulin aspart (NOVOLOG 70/30) (70-30) 100 UNIT/ML injection Inject 10 Units into the skin 2 (two) times daily with a meal. 10 units with breakfast, and 10 units with the evening meal      . losartan (COZAAR) 100 MG tablet Take 1 tablet (100 mg total) by mouth daily.  90 tablet  3  . NITROSTAT 0.4 MG SL tablet Place 0.4 mg under the tongue every 5 (five) minutes as needed for chest pain. For chest pain, max 3 doses      . oxyCODONE-acetaminophen (PERCOCET/ROXICET) 5-325 MG per tablet Take 1-2 tablets by mouth every 4 (four) hours as needed.  40 tablet  0  . warfarin (COUMADIN) 2.5 MG tablet Take 1 tablet (2.5 mg total) by mouth See admin instructions. Take 2.5mg  daily except no coumadin on Thursdays       No current facility-administered medications on file prior to visit.    BP 108/64  Pulse 82  Temp(Src) 98.1 F (36.7 C) (Oral)  Wt 181 lb (82.101 kg)  BMI 27.53 kg/m2chart    Objective:   Physical Exam  Constitutional: He is oriented to person, place, and time. He appears well-developed and well-nourished.  HENT:  Right Ear: External ear normal.  Left Ear: External ear normal.  Nose: Nose normal.  Mouth/Throat: Oropharynx is clear and moist.  Neck: Normal range of motion.  Cardiovascular: Normal rate, regular rhythm and normal heart sounds.   Pulmonary/Chest: Effort normal and breath sounds normal. He has no  wheezes. He has no rales. He exhibits no tenderness.  Abdominal: Soft. Bowel sounds are normal. He exhibits no distension. There is no tenderness. There is no rebound.  Musculoskeletal: Normal range of motion.  Neurological: He is alert and oriented to person, place, and time. He has normal reflexes.  Skin: Skin is warm and dry.     1 cm below noted to be groin. Moderate swelling. Appendectomy scar tissue underlying. Mild bloody discharge. No odor.  Psychiatric: He has a normal mood and affect.          Assessment & Plan:  Assessment: 1. Wound-open, no infection 2. PAD 3. Fever  Plan: I believe the source of his infection and inflammation of the left groin from the procedure itself. He has left groin wound but no purulent drainage or discharge. Advise triple antibiotic ointment to the area and drainage until healed. CBC and BMP sent. Will notify patient of results. Continue to alternate, Tylenol and ibuprofen. Ibuprofen to be used very temporarily. Consult with vascular to decide when to resume Coumadin was discontinued by them. Call with fevers worsen or persist.

## 2012-10-16 LAB — POCT URINALYSIS DIPSTICK
Bilirubin, UA: NEGATIVE
Blood, UA: NEGATIVE
Nitrite, UA: NEGATIVE
pH, UA: 5.5

## 2012-10-16 NOTE — Addendum Note (Signed)
Addended by: Rita Ohara R on: 10/16/2012 04:01 PM   Modules accepted: Orders

## 2012-10-19 ENCOUNTER — Telehealth: Payer: Self-pay

## 2012-10-19 MED ORDER — DOXYCYCLINE HYCLATE 100 MG PO TABS: 100.0000 mg | ORAL_TABLET | Freq: Two times a day (BID) | ORAL | Status: DC

## 2012-10-19 NOTE — Telephone Encounter (Signed)
pt not feeling any better and still running low grade fever.  Send doxycycline bid x 10, per Padonda.  Rx sent and pt aware

## 2012-10-19 NOTE — Telephone Encounter (Signed)
Message copied by Beverely Low on Mon Oct 19, 2012  9:45 AM ------      Message from: Adline Mango B      Created: Fri Oct 16, 2012  4:53 PM       Please call and see how he is doing. Increase intake of water ------

## 2012-10-20 ENCOUNTER — Encounter: Payer: Self-pay | Admitting: Vascular Surgery

## 2012-10-20 ENCOUNTER — Other Ambulatory Visit: Payer: Self-pay

## 2012-10-20 ENCOUNTER — Telehealth: Payer: Self-pay

## 2012-10-20 ENCOUNTER — Ambulatory Visit (INDEPENDENT_AMBULATORY_CARE_PROVIDER_SITE_OTHER): Payer: Medicare Other | Admitting: Vascular Surgery

## 2012-10-20 ENCOUNTER — Ambulatory Visit: Payer: Self-pay | Admitting: Family

## 2012-10-20 ENCOUNTER — Telehealth: Payer: Self-pay | Admitting: Family Medicine

## 2012-10-20 ENCOUNTER — Ambulatory Visit: Payer: Medicare Other | Admitting: Vascular Surgery

## 2012-10-20 VITALS — BP 142/66 | HR 72 | Temp 97.8°F | Ht 68.0 in | Wt 185.6 lb

## 2012-10-20 DIAGNOSIS — I739 Peripheral vascular disease, unspecified: Secondary | ICD-10-CM

## 2012-10-20 NOTE — Telephone Encounter (Signed)
Pt wife will have vein and vascular MD look at incision

## 2012-10-20 NOTE — Progress Notes (Signed)
Subjective:     Patient ID: [name], male   DOB: 10/16/1934, 78 y.o.   MRN: [id number]  HPI this 78-year-old male returns today for continued complaints of some drainage from the left inguinal area and intermittent chills and fever. He had a redo left femoral anterior tibial bypass with composite cephalic and basilic vein from left arm performed on July 30 by me. He previously had a femoral-popliteal Gore-Tex graft on July 16 which occluded almost immediately. His bypass this time has remained patent but he continues to have problems with the left inguinal wound. He is not on any anticoagulants at present time. He is on doxycycline which she is taking twice a day.  Past Medical History  Diagnosis Date  . Hyperlipidemia   . Gout   . Benign prostatic hypertrophy   . Atrial fibrillation     sees Dr. Henry Smith  . Chronic systolic dysfunction of left ventricle     a. mixed ischemic and nonischemic CM,  EF 35%. b. s/p AICD implantation.  . CHF (congestive heart failure)   . ED (erectile dysfunction)   . Arthritis   . Pacemaker     medtronic  . CAD (coronary artery disease)     a. s/p mid LAD stenting with DES 2008 Dr. Henry Smith  . ICD (implantable cardiac defibrillator) in place     medtronic, Dr. Allred  . ICD (implantable cardiac defibrillator) in place     due for check in Feb/2013  . Sleep apnea     hx of "had surgery for"  . Hypertension     sees Dr. S. Fry  . Type II diabetes mellitus   . Automatic implantable cardioverter-defibrillator in situ   . Depression   . Numbness and tingling     Hx; 2f left foot  . PAD (peripheral artery disease)     Severe by PV angiogram 09/2011  . Renal artery stenosis     s/p stenting 2009  . PAD (peripheral artery disease)     s/p multiple LLE bypass grafts; left mid-distal SCA occlusion by 08/2012 duplex    History  Substance Use Topics  . Smoking status: Never Smoker   . Smokeless tobacco: Never Used     Comment: 1-2 cigars  when golfing  . Alcohol Use: 2.4 oz/week    2 Glasses of wine, 2 Shots of liquor per week     Comment: 07/10/2012 "galss of wine or vodka tonic 3 X/wk"    Family History  Problem Relation Age of Onset  . Cancer      breast/fhx  . Heart disease      fhx  . Diabetes Neg Hx   . Cancer Father     Allergies  Allergen Reactions  . Other Other (See Comments)    Plastic tape tears skin    Current outpatient prescriptions:atorvastatin (LIPITOR) 10 MG tablet, Take 1 tablet (10 mg total) by mouth daily., Disp: 30 tablet, Rfl: 4;  carvedilol (COREG) 6.25 MG tablet, Take 1 tablet (6.25 mg total) by mouth 2 (two) times daily with a meal., Disp: 180 tablet, Rfl: 3;  doxycycline (VIBRA-TABS) 100 MG tablet, Take 1 tablet (100 mg total) by mouth 2 (two) times daily., Disp: 20 tablet, Rfl: 0 DULoxetine (CYMBALTA) 30 MG capsule, Take 1 capsule (30 mg total) by mouth daily., Disp: 30 capsule, Rfl: 5;  finasteride (PROSCAR) 5 MG tablet, Take 5 mg by mouth daily., Disp: , Rfl: ;  furosemide (LASIX) 40 MG tablet,   Take 1 tablet (40 mg total) by mouth daily., Disp: 90 tablet, Rfl: 3;  gabapentin (NEURONTIN) 300 MG capsule, Take 300 mg by mouth 3 (three) times daily., Disp: , Rfl:  glucose blood (ONE TOUCH ULTRA TEST) test strip, Use to test blood sugar 2 times daily as instructed. Dx code: 250.73, Disp: 100 each, Rfl: 3;  glucose blood test strip, Use as instructed, Disp: 100 each, Rfl: 4;  insulin aspart protamine-insulin aspart (NOVOLOG 70/30) (70-30) 100 UNIT/ML injection, Inject 10 Units into the skin 2 (two) times daily with a meal. 10 units with breakfast, and 10 units with the evening meal, Disp: , Rfl:  losartan (COZAAR) 100 MG tablet, Take 1 tablet (100 mg total) by mouth daily., Disp: 90 tablet, Rfl: 3;  NITROSTAT 0.4 MG SL tablet, Place 0.4 mg under the tongue every 5 (five) minutes as needed for chest pain. For chest pain, max 3 doses, Disp: , Rfl: ;  oxyCODONE-acetaminophen (PERCOCET/ROXICET) 5-325 MG per  tablet, Take 1-2 tablets by mouth every 4 (four) hours as needed., Disp: 40 tablet, Rfl: 0 warfarin (COUMADIN) 2.5 MG tablet, Take 1 tablet (2.5 mg total) by mouth See admin instructions. Take 2.5mg daily except no coumadin on Thursdays, Disp: , Rfl:   BP 142/66  Pulse 72  Temp(Src) 97.8 F (36.6 C) (Oral)  Ht 5' 8" (1.727 m)  Wt 185 lb 9.6 oz (84.188 kg)  BMI 28.23 kg/m2  SpO2 100%  Body mass index is 28.23 kg/(m^2).           Review of Systems denies chest pain, dyspnea on exertion, PND, orthopnea, hemoptysis, has had intermittent chills over the past few weeks.    Objective:   Physical Exam BP 142/66  Pulse 72  Temp(Src) 97.8 F (36.6 C) (Oral)  Ht 5' 8" (1.727 m)  Wt 185 lb 9.6 oz (84.188 kg)  BMI 28.23 kg/m2  SpO2 100%  Gen.-alert and oriented x3 in no apparent distress HEENT normal for age Lungs no rhonchi or wheezing Cardiovascular regular rhythm no murmurs carotid pulses 3+ palpable no bruits audible Abdomen soft nontender no palpable masses Musculoskeletal free of  major deformities Skin clear -no rashes Neurologic normal Lower extremities left leg with ecchymosis and blistering of the lateral aspect of the left inguinal wound. I am able to express some brownish fluid and explored this with a swab down to the area of the common femoral artery. No purulent drainage noted. The femoral to anterior tibial graft continues to have an excellent pulse. He does have 1-2+ edema below the knee the left leg which is unchanged. All incisions the left upper extremity filled nicely.        Assessment:     Nonhealing of left inguinal wound 3 weeks post redo left femoral anterior tibial vein graft    Plan:     Will reexplore the left inguinal wound in the OR tomorrow-possibly leave open partially-possible VAC dressing      

## 2012-10-20 NOTE — Telephone Encounter (Signed)
Pt's significant other called to report that pt. Has increased redness in left groin incisional area.  Describes a "huge hematoma the size of a large egg, and a bubble on top of the hematoma".  Also reports that the top of (L) groin incision "appears to be splitting-off and opening-up".  Reports recently seeing his PCP and "started on Doxycycline 2 times/day for an elevated WBC".  Questions if pt. should return to PCP with the opening of left groin incision, or be seen per Dr. Hart Rochester.  Last temperature at bedtime 8/18: 99.7.  Reports pt. has had fevers of 101 degrees the past 3 nights.  Discussed w/ Dr. Hart Rochester.  Advised to bring pt. to office today for eval.  Appt. given for 3:00 pm today; pt. notified and agrees.

## 2012-10-20 NOTE — Telephone Encounter (Signed)
Ok schedule him an appointment. I have a 415pm.

## 2012-10-20 NOTE — Telephone Encounter (Signed)
Patient Information:  Caller Name: Claudy  Phone: [phone]  Patient: [name], [name]  Gender: Male  DOB: September 12, 1934  Age: 78 Years  PCP: Adline [facility])  Office Follow Up:  Does the office need to follow up with this patient?: No  Instructions For The Office: N/A  RN Note:  Incision separation follow up.  Traige offered.  Wife/Pt frustrated they have already spoken to several people and wants to see PA or MD.  Pt started Doxycycline as prescribed on 8-18.  Wife noticed yellow drainage within wound, appt requesting.  Symptoms  Reason For Call & Symptoms: Incission separation follow up.  Traige offered.  Wife/Pt frustrated they have already spoken to several people and wants to see PA or MD.  Reviewed Health History In EMR: N/A  Reviewed Medications In EMR: N/A  Reviewed Allergies In EMR: N/A  Reviewed Surgeries / Procedures: N/A  Date of Onset of Symptoms: 10/20/2012  Guideline(s) Used:  No Protocol Available - Sick Adult  Disposition Per Guideline:   See Today in Office  Reason For Disposition Reached:   Patient wants to be seen  Advice Given:  N/A  Patient Will Follow Care Advice:  YES  Appointment Scheduled:  10/20/2012 14:45:00 Appointment Scheduled Provider:  Adline [facility])

## 2012-10-21 ENCOUNTER — Encounter (HOSPITAL_COMMUNITY)
Admission: RE | Disposition: A | Discharge status: Home / Self Care | Payer: Self-pay | Source: Ambulatory Visit | Attending: Vascular Surgery

## 2012-10-21 ENCOUNTER — Encounter (HOSPITAL_COMMUNITY): Payer: Self-pay | Admitting: Critical Care Medicine

## 2012-10-21 ENCOUNTER — Encounter (HOSPITAL_COMMUNITY): Payer: Self-pay | Admitting: *Deleted

## 2012-10-21 ENCOUNTER — Inpatient Hospital Stay (HOSPITAL_COMMUNITY): Payer: Medicare Other | Admitting: Critical Care Medicine

## 2012-10-21 ENCOUNTER — Inpatient Hospital Stay (HOSPITAL_COMMUNITY)
Admission: RE | Admit: 2012-10-21 | Discharge: 2012-11-18 | DRG: 253 | Disposition: A | Discharge status: Home / Self Care | Payer: Medicare Other | Source: Ambulatory Visit | Attending: Vascular Surgery | Admitting: Vascular Surgery

## 2012-10-21 DIAGNOSIS — I739 Peripheral vascular disease, unspecified: Secondary | ICD-10-CM | POA: Diagnosis present

## 2012-10-21 DIAGNOSIS — L988 Other specified disorders of the skin and subcutaneous tissue: Secondary | ICD-10-CM | POA: Diagnosis present

## 2012-10-21 DIAGNOSIS — F329 Major depressive disorder, single episode, unspecified: Secondary | ICD-10-CM | POA: Diagnosis present

## 2012-10-21 DIAGNOSIS — R509 Fever, unspecified: Secondary | ICD-10-CM | POA: Diagnosis present

## 2012-10-21 DIAGNOSIS — T82898A Other specified complication of vascular prosthetic devices, implants and grafts, initial encounter: Principal | ICD-10-CM | POA: Diagnosis present

## 2012-10-21 DIAGNOSIS — D62 Acute posthemorrhagic anemia: Secondary | ICD-10-CM | POA: Diagnosis not present

## 2012-10-21 DIAGNOSIS — E119 Type 2 diabetes mellitus without complications: Secondary | ICD-10-CM | POA: Diagnosis present

## 2012-10-21 DIAGNOSIS — N400 Enlarged prostate without lower urinary tract symptoms: Secondary | ICD-10-CM | POA: Diagnosis present

## 2012-10-21 DIAGNOSIS — M109 Gout, unspecified: Secondary | ICD-10-CM | POA: Diagnosis not present

## 2012-10-21 DIAGNOSIS — I4891 Unspecified atrial fibrillation: Secondary | ICD-10-CM | POA: Diagnosis present

## 2012-10-21 DIAGNOSIS — Y832 Surgical operation with anastomosis, bypass or graft as the cause of abnormal reaction of the patient, or of later complication, without mention of misadventure at the time of the procedure: Secondary | ICD-10-CM | POA: Diagnosis present

## 2012-10-21 DIAGNOSIS — N183 Chronic kidney disease, stage 3 unspecified: Secondary | ICD-10-CM | POA: Diagnosis present

## 2012-10-21 DIAGNOSIS — I70219 Atherosclerosis of native arteries of extremities with intermittent claudication, unspecified extremity: Secondary | ICD-10-CM

## 2012-10-21 DIAGNOSIS — M129 Arthropathy, unspecified: Secondary | ICD-10-CM | POA: Diagnosis present

## 2012-10-21 DIAGNOSIS — I2589 Other forms of chronic ischemic heart disease: Secondary | ICD-10-CM | POA: Diagnosis present

## 2012-10-21 DIAGNOSIS — Z95810 Presence of automatic (implantable) cardiac defibrillator: Secondary | ICD-10-CM

## 2012-10-21 DIAGNOSIS — E785 Hyperlipidemia, unspecified: Secondary | ICD-10-CM | POA: Diagnosis present

## 2012-10-21 DIAGNOSIS — R609 Edema, unspecified: Secondary | ICD-10-CM | POA: Diagnosis present

## 2012-10-21 DIAGNOSIS — I509 Heart failure, unspecified: Secondary | ICD-10-CM | POA: Diagnosis present

## 2012-10-21 DIAGNOSIS — Z794 Long term (current) use of insulin: Secondary | ICD-10-CM

## 2012-10-21 DIAGNOSIS — I129 Hypertensive chronic kidney disease with stage 1 through stage 4 chronic kidney disease, or unspecified chronic kidney disease: Secondary | ICD-10-CM | POA: Diagnosis present

## 2012-10-21 DIAGNOSIS — I2510 Atherosclerotic heart disease of native coronary artery without angina pectoris: Secondary | ICD-10-CM | POA: Diagnosis present

## 2012-10-21 DIAGNOSIS — A4902 Methicillin resistant Staphylococcus aureus infection, unspecified site: Secondary | ICD-10-CM | POA: Diagnosis present

## 2012-10-21 DIAGNOSIS — Z79899 Other long term (current) drug therapy: Secondary | ICD-10-CM

## 2012-10-21 DIAGNOSIS — F3289 Other specified depressive episodes: Secondary | ICD-10-CM | POA: Diagnosis present

## 2012-10-21 HISTORY — PX: I&D EXTREMITY: SHX5045

## 2012-10-21 HISTORY — PX: PATCH ANGIOPLASTY: SHX6230

## 2012-10-21 LAB — GLUCOSE, CAPILLARY
Glucose-Capillary: 105 mg/dL — ABNORMAL HIGH (ref 70–99)
Glucose-Capillary: 140 mg/dL — ABNORMAL HIGH (ref 70–99)
Glucose-Capillary: 124 mg/dL — ABNORMAL HIGH (ref 70–99)
Glucose-Capillary: 91 mg/dL (ref 70–99)

## 2012-10-21 LAB — PROTIME-INR
INR: 1.53 — ABNORMAL HIGH (ref 0.00–1.49)
Prothrombin Time: 18 seconds — ABNORMAL HIGH (ref 11.6–15.2)

## 2012-10-21 LAB — CBC
Hemoglobin: 11.5 g/dL — ABNORMAL LOW (ref 13.0–17.0)
MCHC: 33.1 g/dL (ref 30.0–36.0)
Platelets: 189 10*3/uL (ref 150–400)
RBC: 4.15 MIL/uL — ABNORMAL LOW (ref 4.22–5.81)
RDW: 15.6 % — ABNORMAL HIGH (ref 11.5–15.5)
WBC: 9.5 10*3/uL (ref 4.0–10.5)

## 2012-10-21 LAB — CREATININE, SERUM
Creatinine, Ser: 1.87 mg/dL — ABNORMAL HIGH (ref 0.50–1.35)
GFR calc non Af Amer: 33 mL/min — ABNORMAL LOW (ref >90)

## 2012-10-21 LAB — PREPARE RBC (CROSSMATCH)

## 2012-10-21 LAB — BASIC METABOLIC PANEL
BUN: 36 mg/dL — ABNORMAL HIGH (ref 6–23)
Calcium: 8.9 mg/dL (ref 8.4–10.5)
Creatinine, Ser: 2.09 mg/dL — ABNORMAL HIGH (ref 0.50–1.35)
GFR calc Af Amer: 34 mL/min — ABNORMAL LOW (ref >90)

## 2012-10-21 SURGERY — IRRIGATION AND DEBRIDEMENT EXTREMITY
Anesthesia: General | Site: Groin | Laterality: Left | Wound class: Clean

## 2012-10-21 MED ORDER — 0.9 % SODIUM CHLORIDE (POUR BTL) OPTIME
TOPICAL | Status: DC | PRN
  Administered 2012-10-21: 1000 mL

## 2012-10-21 MED ORDER — HYDROMORPHONE HCL PF 1 MG/ML IJ SOLN
0.5000 mg | INTRAMUSCULAR | Status: DC | PRN
  Administered 2012-10-21: 1 mg via INTRAVENOUS
  Filled 2012-10-21: qty 1

## 2012-10-21 MED ORDER — SODIUM CHLORIDE 0.9 % IV SOLN
INTRAVENOUS | Status: DC
  Administered 2012-10-21: 100 mL/h via INTRAVENOUS

## 2012-10-21 MED ORDER — GUAIFENESIN-DM 100-10 MG/5ML PO SYRP: 15.0000 mL | ORAL_SOLUTION | ORAL | Status: DC | PRN

## 2012-10-21 MED ORDER — NEOSTIGMINE METHYLSULFATE 1 MG/ML IJ SOLN
INTRAMUSCULAR | Status: DC | PRN
  Administered 2012-10-21: 3 mg via INTRAVENOUS

## 2012-10-21 MED ORDER — PIPERACILLIN-TAZOBACTAM 3.375 G IVPB
3.3750 g | Freq: Three times a day (TID) | INTRAVENOUS | Status: DC
  Administered 2012-10-21 – 2012-10-24 (×9): 3.375 g via INTRAVENOUS
  Filled 2012-10-21 (×10): qty 50

## 2012-10-21 MED ORDER — PROPOFOL 10 MG/ML IV BOLUS
INTRAVENOUS | Status: DC | PRN
  Administered 2012-10-21: 150 mg via INTRAVENOUS

## 2012-10-21 MED ORDER — HYDROMORPHONE HCL PF 1 MG/ML IJ SOLN
0.2500 mg | INTRAMUSCULAR | Status: DC | PRN
Start: 2012-10-21 — End: 2012-10-21
  Administered 2012-10-21 (×2): 0.5 mg via INTRAVENOUS

## 2012-10-21 MED ORDER — ALUM & MAG HYDROXIDE-SIMETH 200-200-20 MG/5ML PO SUSP: 15.0000 mL | ORAL | Status: DC | PRN

## 2012-10-21 MED ORDER — INSULIN ASPART PROT & ASPART (70-30 MIX) 100 UNIT/ML ~~LOC~~ SUSP
10.0000 [IU] | Freq: Two times a day (BID) | SUBCUTANEOUS | Status: DC
  Administered 2012-10-22 – 2012-10-25 (×5): 10 [IU] via SUBCUTANEOUS
  Filled 2012-10-21: qty 10

## 2012-10-21 MED ORDER — ACETAMINOPHEN 325 MG PO TABS
325.0000 mg | ORAL_TABLET | ORAL | Status: DC | PRN
  Administered 2012-10-22: 325 mg via ORAL
  Administered 2012-10-22 – 2012-11-17 (×21): 650 mg via ORAL
  Filled 2012-10-21 (×23): qty 2

## 2012-10-21 MED ORDER — DEXTROSE 5 % IV SOLN
1.5000 g | INTRAVENOUS | Status: AC
  Administered 2012-10-21: 1.5 g via INTRAVENOUS
  Filled 2012-10-21: qty 1.5

## 2012-10-21 MED ORDER — MIDAZOLAM HCL 5 MG/5ML IJ SOLN
INTRAMUSCULAR | Status: DC | PRN
  Administered 2012-10-21 (×2): 1 mg via INTRAVENOUS

## 2012-10-21 MED ORDER — LOSARTAN POTASSIUM 50 MG PO TABS
100.0000 mg | ORAL_TABLET | Freq: Every day | ORAL | Status: DC
  Administered 2012-10-22 – 2012-11-18 (×28): 100 mg via ORAL
  Filled 2012-10-21 (×28): qty 2

## 2012-10-21 MED ORDER — CHLORHEXIDINE GLUCONATE CLOTH 2 % EX PADS
6.0000 | MEDICATED_PAD | Freq: Every day | CUTANEOUS | Status: AC
  Administered 2012-10-22 – 2012-10-25 (×2): 6 via TOPICAL

## 2012-10-21 MED ORDER — HYDRALAZINE HCL 20 MG/ML IJ SOLN: 10.0000 mg | INTRAMUSCULAR | Status: DC | PRN

## 2012-10-21 MED ORDER — POTASSIUM CHLORIDE CRYS ER 20 MEQ PO TBCR
20.0000 meq | EXTENDED_RELEASE_TABLET | Freq: Every day | ORAL | Status: DC | PRN
  Administered 2012-11-06: 20 meq via ORAL
  Filled 2012-10-21: qty 1

## 2012-10-21 MED ORDER — FINASTERIDE 5 MG PO TABS
5.0000 mg | ORAL_TABLET | Freq: Every day | ORAL | Status: DC
  Administered 2012-10-22 – 2012-11-18 (×28): 5 mg via ORAL
  Filled 2012-10-21 (×29): qty 1

## 2012-10-21 MED ORDER — NITROGLYCERIN 0.4 MG SL SUBL: 0.4000 mg | SUBLINGUAL_TABLET | SUBLINGUAL | Status: DC | PRN

## 2012-10-21 MED ORDER — LABETALOL HCL 5 MG/ML IV SOLN
10.0000 mg | INTRAVENOUS | Status: DC | PRN
  Filled 2012-10-21: qty 4

## 2012-10-21 MED ORDER — ACETAMINOPHEN 325 MG RE SUPP
325.0000 mg | RECTAL | Status: DC | PRN
  Filled 2012-10-21: qty 2

## 2012-10-21 MED ORDER — PIPERACILLIN-TAZOBACTAM 3.375 G IVPB
3.3750 g | Freq: Three times a day (TID) | INTRAVENOUS | Status: DC
  Administered 2012-10-21: 3.375 g via INTRAVENOUS
  Filled 2012-10-21 (×2): qty 50

## 2012-10-21 MED ORDER — VANCOMYCIN HCL 10 G IV SOLR
1250.0000 mg | INTRAVENOUS | Status: DC
  Administered 2012-10-21 – 2012-10-24 (×4): 1250 mg via INTRAVENOUS
  Filled 2012-10-21 (×4): qty 1250

## 2012-10-21 MED ORDER — FUROSEMIDE 40 MG PO TABS
40.0000 mg | ORAL_TABLET | Freq: Every day | ORAL | Status: DC
  Administered 2012-10-22 – 2012-11-18 (×24): 40 mg via ORAL
  Filled 2012-10-21 (×28): qty 1

## 2012-10-21 MED ORDER — LACTATED RINGERS IV SOLN
INTRAVENOUS | Status: DC | PRN
  Administered 2012-10-21 (×2): via INTRAVENOUS

## 2012-10-21 MED ORDER — ONDANSETRON HCL 4 MG/2ML IJ SOLN: 4.0000 mg | Freq: Four times a day (QID) | INTRAMUSCULAR | Status: DC | PRN

## 2012-10-21 MED ORDER — LACTATED RINGERS IV SOLN
INTRAVENOUS | Status: DC | PRN
  Administered 2012-10-21: 13:00:00 via INTRAVENOUS

## 2012-10-21 MED ORDER — GLYCOPYRROLATE 0.2 MG/ML IJ SOLN
INTRAMUSCULAR | Status: DC | PRN
  Administered 2012-10-21: 0.4 mg via INTRAVENOUS

## 2012-10-21 MED ORDER — GABAPENTIN 300 MG PO CAPS
300.0000 mg | ORAL_CAPSULE | Freq: Three times a day (TID) | ORAL | Status: DC
  Administered 2012-10-21 – 2012-11-18 (×82): 300 mg via ORAL
  Filled 2012-10-21 (×86): qty 1

## 2012-10-21 MED ORDER — DOCUSATE SODIUM 100 MG PO CAPS
100.0000 mg | ORAL_CAPSULE | Freq: Every day | ORAL | Status: DC
  Administered 2012-10-22 – 2012-11-16 (×9): 100 mg via ORAL
  Filled 2012-10-21 (×22): qty 1

## 2012-10-21 MED ORDER — ENOXAPARIN SODIUM 40 MG/0.4ML ~~LOC~~ SOLN
40.0000 mg | SUBCUTANEOUS | Status: DC
  Administered 2012-10-21 – 2012-10-29 (×9): 40 mg via SUBCUTANEOUS
  Filled 2012-10-21 (×10): qty 0.4

## 2012-10-21 MED ORDER — HYDROMORPHONE HCL PF 1 MG/ML IJ SOLN
INTRAMUSCULAR | Status: AC
  Filled 2012-10-21: qty 1

## 2012-10-21 MED ORDER — OXYCODONE-ACETAMINOPHEN 5-325 MG PO TABS
1.0000 | ORAL_TABLET | ORAL | Status: DC | PRN
  Administered 2012-10-23: 1 via ORAL
  Administered 2012-10-26 – 2012-10-27 (×2): 2 via ORAL
  Administered 2012-10-27: 1 via ORAL
  Administered 2012-10-28: 2 via ORAL
  Administered 2012-10-28 – 2012-10-30 (×4): 1 via ORAL
  Administered 2012-11-16: 2 via ORAL
  Filled 2012-10-21 (×3): qty 2
  Filled 2012-10-21 (×2): qty 1
  Filled 2012-10-21: qty 2
  Filled 2012-10-21 (×5): qty 1

## 2012-10-21 MED ORDER — CARVEDILOL 6.25 MG PO TABS
6.2500 mg | ORAL_TABLET | Freq: Once | ORAL | Status: AC
  Administered 2012-10-21: 6.25 mg via ORAL
  Filled 2012-10-21: qty 1

## 2012-10-21 MED ORDER — CARVEDILOL 6.25 MG PO TABS
6.2500 mg | ORAL_TABLET | Freq: Two times a day (BID) | ORAL | Status: DC
  Administered 2012-10-22 – 2012-11-18 (×53): 6.25 mg via ORAL
  Filled 2012-10-21 (×57): qty 1

## 2012-10-21 MED ORDER — OXYCODONE HCL 5 MG PO TABS: 5.0000 mg | ORAL_TABLET | Freq: Once | ORAL | Status: DC | PRN

## 2012-10-21 MED ORDER — MUPIROCIN 2 % EX OINT
1.0000 "application " | TOPICAL_OINTMENT | Freq: Two times a day (BID) | CUTANEOUS | Status: AC
  Administered 2012-10-21 – 2012-10-26 (×8): 1 via NASAL
  Filled 2012-10-21 (×2): qty 22

## 2012-10-21 MED ORDER — ROCURONIUM BROMIDE 100 MG/10ML IV SOLN
INTRAVENOUS | Status: DC | PRN
  Administered 2012-10-21 (×3): 10 mg via INTRAVENOUS
  Administered 2012-10-21: 40 mg via INTRAVENOUS

## 2012-10-21 MED ORDER — INSULIN ASPART 100 UNIT/ML ~~LOC~~ SOLN
0.0000 [IU] | Freq: Three times a day (TID) | SUBCUTANEOUS | Status: DC
  Administered 2012-10-21: 2 [IU] via SUBCUTANEOUS
  Administered 2012-10-22: 3 [IU] via SUBCUTANEOUS
  Administered 2012-10-23: 5 [IU] via SUBCUTANEOUS
  Administered 2012-10-24 – 2012-10-25 (×3): 2 [IU] via SUBCUTANEOUS
  Administered 2012-10-25: 5 [IU] via SUBCUTANEOUS

## 2012-10-21 MED ORDER — OXYCODONE HCL 5 MG/5ML PO SOLN: 5.0000 mg | Freq: Once | ORAL | Status: DC | PRN

## 2012-10-21 MED ORDER — ONDANSETRON HCL 4 MG/2ML IJ SOLN
INTRAMUSCULAR | Status: DC | PRN
  Administered 2012-10-21: 4 mg via INTRAVENOUS

## 2012-10-21 MED ORDER — FENTANYL CITRATE 0.05 MG/ML IJ SOLN
INTRAMUSCULAR | Status: DC | PRN
  Administered 2012-10-21: 50 ug via INTRAVENOUS
  Administered 2012-10-21: 100 ug via INTRAVENOUS

## 2012-10-21 MED ORDER — PHENYLEPHRINE HCL 10 MG/ML IJ SOLN
10.0000 mg | INTRAVENOUS | Status: DC | PRN
  Administered 2012-10-21: 50 ug/min via INTRAVENOUS

## 2012-10-21 MED ORDER — METOPROLOL TARTRATE 1 MG/ML IV SOLN: 2.0000 mg | INTRAVENOUS | Status: DC | PRN

## 2012-10-21 MED ORDER — PROMETHAZINE HCL 25 MG/ML IJ SOLN: 6.2500 mg | INTRAMUSCULAR | Status: DC | PRN

## 2012-10-21 MED ORDER — SODIUM CHLORIDE 0.9 % IV SOLN: 500.0000 mL | Freq: Once | INTRAVENOUS | Status: AC | PRN

## 2012-10-21 MED ORDER — HEPARIN SODIUM (PORCINE) 1000 UNIT/ML IJ SOLN
INTRAMUSCULAR | Status: DC | PRN
  Administered 2012-10-21: 2000 [IU] via INTRAVENOUS
  Administered 2012-10-21: 6000 [IU] via INTRAVENOUS

## 2012-10-21 MED ORDER — SODIUM CHLORIDE 0.9 % IV SOLN
INTRAVENOUS | Status: DC
  Administered 2012-10-21: 09:00:00 via INTRAVENOUS

## 2012-10-21 MED ORDER — PANTOPRAZOLE SODIUM 40 MG PO TBEC
40.0000 mg | DELAYED_RELEASE_TABLET | Freq: Every day | ORAL | Status: DC
  Administered 2012-10-22 – 2012-11-16 (×17): 40 mg via ORAL
  Filled 2012-10-21 (×20): qty 1

## 2012-10-21 MED ORDER — ATORVASTATIN CALCIUM 10 MG PO TABS
10.0000 mg | ORAL_TABLET | Freq: Every day | ORAL | Status: DC
  Administered 2012-10-22 – 2012-11-18 (×27): 10 mg via ORAL
  Filled 2012-10-21 (×28): qty 1

## 2012-10-21 MED ORDER — PHENOL 1.4 % MT LIQD: 1.0000 | OROMUCOSAL | Status: DC | PRN

## 2012-10-21 MED ORDER — SODIUM CHLORIDE 0.9 % IR SOLN
Status: DC | PRN
  Administered 2012-10-21: 12:00:00

## 2012-10-21 MED ORDER — LIDOCAINE HCL (CARDIAC) 20 MG/ML IV SOLN
INTRAVENOUS | Status: DC | PRN
  Administered 2012-10-21: 100 mg via INTRAVENOUS

## 2012-10-21 MED ORDER — DULOXETINE HCL 30 MG PO CPEP
30.0000 mg | ORAL_CAPSULE | Freq: Every day | ORAL | Status: DC
  Administered 2012-10-22 – 2012-11-15 (×10): 30 mg via ORAL
  Filled 2012-10-21 (×29): qty 1

## 2012-10-21 SURGICAL SUPPLY — 54 items
BANDAGE ELASTIC 4 VELCRO ST LF (GAUZE/BANDAGES/DRESSINGS) IMPLANT
BANDAGE ELASTIC 6 VELCRO ST LF (GAUZE/BANDAGES/DRESSINGS) IMPLANT
BANDAGE GAUZE ELAST BULKY 4 IN (GAUZE/BANDAGES/DRESSINGS) IMPLANT
CANISTER SUCTION 2500CC (MISCELLANEOUS) ×2 IMPLANT
CATH EMB 3FR 40CM (CATHETERS) ×1 IMPLANT
CATH EMB 4FR 40CM (CATHETERS) ×2 IMPLANT
CATH EMB 5FR 80CM (CATHETERS) ×1 IMPLANT
CLIP TI MEDIUM 24 (CLIP) ×1 IMPLANT
CLIP TI WIDE RED SMALL 24 (CLIP) ×1 IMPLANT
CLOTH BEACON ORANGE TIMEOUT ST (SAFETY) ×2 IMPLANT
COVER SURGICAL LIGHT HANDLE (MISCELLANEOUS) ×2 IMPLANT
ELECT REM PT RETURN 9FT ADLT (ELECTROSURGICAL) ×2
ELECTRODE REM PT RTRN 9FT ADLT (ELECTROSURGICAL) ×1 IMPLANT
GLOVE BIOGEL PI IND STRL 6.5 (GLOVE) IMPLANT
GLOVE BIOGEL PI IND STRL 7.0 (GLOVE) IMPLANT
GLOVE BIOGEL PI INDICATOR 6.5 (GLOVE) ×2
GLOVE BIOGEL PI INDICATOR 7.0 (GLOVE) ×2
GLOVE ECLIPSE 6.5 STRL STRAW (GLOVE) ×2 IMPLANT
GLOVE SS BIOGEL STRL SZ 7 (GLOVE) ×1 IMPLANT
GLOVE SUPERSENSE BIOGEL SZ 7 (GLOVE) ×1
GOWN PREVENTION PLUS XXLARGE (GOWN DISPOSABLE) ×1 IMPLANT
GOWN STRL NON-REIN LRG LVL3 (GOWN DISPOSABLE) ×6 IMPLANT
KIT BASIN OR (CUSTOM PROCEDURE TRAY) ×2 IMPLANT
KIT ROOM TURNOVER OR (KITS) ×2 IMPLANT
NS IRRIG 1000ML POUR BTL (IV SOLUTION) ×2 IMPLANT
PACK CV ACCESS (CUSTOM PROCEDURE TRAY) IMPLANT
PACK GENERAL/GYN (CUSTOM PROCEDURE TRAY) IMPLANT
PACK UNIVERSAL I (CUSTOM PROCEDURE TRAY) IMPLANT
PAD ARMBOARD 7.5X6 YLW CONV (MISCELLANEOUS) ×4 IMPLANT
SPONGE GAUZE 4X4 12PLY (GAUZE/BANDAGES/DRESSINGS) ×2 IMPLANT
SPONGE LAP 18X18 X RAY DECT (DISPOSABLE) ×1 IMPLANT
STAPLER VISISTAT 35W (STAPLE) IMPLANT
STOPCOCK 4 WAY LG BORE MALE ST (IV SETS) ×2 IMPLANT
STRIP PERIGUARD 4X4 (Vascular Products) ×1 IMPLANT
SUT ETHILON 3 0 PS 1 (SUTURE) ×1 IMPLANT
SUT PROLENE 5 0 C 1 24 (SUTURE) ×1 IMPLANT
SUT PROLENE 6 0 C 1 24 (SUTURE) ×2 IMPLANT
SUT PROLENE 6 0 CC (SUTURE) ×2 IMPLANT
SUT VIC AB 2-0 CT1 18 (SUTURE) ×2 IMPLANT
SUT VIC AB 2-0 CT1 27 (SUTURE) ×4
SUT VIC AB 2-0 CT1 TAPERPNT 27 (SUTURE) IMPLANT
SUT VIC AB 2-0 CTX 36 (SUTURE) IMPLANT
SUT VIC AB 3-0 SH 27 (SUTURE)
SUT VIC AB 3-0 SH 27X BRD (SUTURE) IMPLANT
SUT VICRYL 4-0 PS2 18IN ABS (SUTURE) IMPLANT
SWAB COLLECTION DEVICE MRSA (MISCELLANEOUS) ×1 IMPLANT
SYR 3ML LL SCALE MARK (SYRINGE) ×2 IMPLANT
TAPE PAPER 3X10 WHT MICROPORE (GAUZE/BANDAGES/DRESSINGS) ×1 IMPLANT
TOWEL OR 17X24 6PK STRL BLUE (TOWEL DISPOSABLE) ×2 IMPLANT
TOWEL OR 17X26 10 PK STRL BLUE (TOWEL DISPOSABLE) ×2 IMPLANT
TRAY FOLEY CATH 16FRSI W/METER (SET/KITS/TRAYS/PACK) ×1 IMPLANT
TUBE ANAEROBIC SPECIMEN COL (MISCELLANEOUS) ×1 IMPLANT
WATER STERILE IRR 1000ML POUR (IV SOLUTION) ×2 IMPLANT
YANKAUER SUCT BULB TIP NO VENT (SUCTIONS) ×1 IMPLANT

## 2012-10-21 NOTE — Anesthesia Procedure Notes (Signed)
Procedure Name: Intubation Date/Time: 10/21/2012 11:26 AM Performed by: Elon Alas Pre-anesthesia Checklist: Patient identified, Emergency Drugs available, Suction available, Patient being monitored and Timeout performed Patient Re-evaluated:Patient Re-evaluated prior to inductionOxygen Delivery Method: Circle system utilized Preoxygenation: Pre-oxygenation with 100% oxygen Intubation Type: IV induction Ventilation: Mask ventilation without difficulty Laryngoscope Size: Mac and 4 Grade View: Grade I Tube type: Oral Tube size: 7.5 mm Number of attempts: 1 Airway Equipment and Method: Stylet Placement Confirmation: CO2 detector,  positive ETCO2 and ETT inserted through vocal cords under direct vision Secured at: 23 cm Tube secured with: Tape Dental Injury: Teeth and Oropharynx as per pre-operative assessment

## 2012-10-21 NOTE — Progress Notes (Signed)
ANTIBIOTIC CONSULT NOTE - INITIAL  Pharmacy Consult for Vancomycin Indication: infected wound left groin  Allergies  Allergen Reactions  . Other Other (See Comments)    Plastic tape tears skin    Patient Measurements: Height: 5\' 8"  (172.7 cm) Weight: 185 lb 10 oz (84.2 kg) IBW/kg (Calculated) : 68.4  Vital Signs: Temp: 97.3 F (36.3 C) (08/20 1700) Temp src: Oral (08/20 1700) BP: 152/77 mmHg (08/20 1700) Pulse Rate: 71 (08/20 1700)  Intake/Output from this shift: Total I/O In: 3050 [I.V.:2000; Blood:1050] Out: 1425 [Urine:125; Blood:1300]  Labs:  Estimated Creatinine Clearance: 31.8 ml/min (by C-G formula based on Cr of 2.09).   Microbiology:   8/20 - abscess culture -  Medical History: Past Medical History  Diagnosis Date  . Hyperlipidemia   . Gout   . Benign prostatic hypertrophy   . Atrial fibrillation     sees Dr. Verdis Prime  . Chronic systolic dysfunction of left ventricle     a. mixed ischemic and nonischemic CM,  EF 35%. b. s/p AICD implantation.  . CHF (congestive heart failure)   . ED (erectile dysfunction)   . Arthritis   . Pacemaker     medtronic  . CAD (coronary artery disease)     a. s/p mid LAD stenting with DES 2008 Dr. Verdis Prime  . ICD (implantable cardiac defibrillator) in place     medtronic, Dr. Johney Frame  . ICD (implantable cardiac defibrillator) in place     due for check in Feb/2013  . Sleep apnea     hx of "had surgery for"  . Hypertension     sees Dr. Claris Che  . Type II diabetes mellitus   . Automatic implantable cardioverter-defibrillator in situ   . Depression   . Numbness and tingling     Hx; 57f left foot  . PAD (peripheral artery disease)     Severe by PV angiogram 09/2011  . Renal artery stenosis     s/p stenting 2009  . PAD (peripheral artery disease)     s/p multiple LLE bypass grafts; left mid-distal SCA occlusion by 08/2012 duplex   Assessment:  78 yr old man, with non-healing inguinal wound s/p re-do peripheral  bypass surgery 09/30/12, admitted on 8/19 with drainage from inguinal area, chills and fever.  Now s/p exploration and debridement of wound and patch angioplasty.  Zinacef 1.5 grams given pre-op.  Vancomycin and Zosyn x 24 hours ordered post-op; Pharmacy to dose Vancomycin. Renal insufficiency.  Goal of Therapy:   Vancomycin trough levels 15-20 mcg/ml  Plan:   Vancomycin 1250 mg IV q24hrs. Removed stop-time of 24hrs.  Adjusted Zosyn 3.375 grams IV (each infused over 4 hours) from q6hrs to q8hrs.    Removed Zosyn stop time.  Continue at least until culture is final?  Antibiotics can be stopped on 8/21, if only 24hrs post-op coverage desired.  Will follow up renal function, culture data and clinical status.     Dennie Fetters, RPh Pager: [phone] 10/21/2012,5:30 PM

## 2012-10-21 NOTE — Op Note (Signed)
OPERATIVE REPORT  Date of Surgery: 10/21/2012  Surgeon: [name], MD  Assistant: Clearence Ped Pre-op Diagnosis: Nonhealing left inguinal wound status post redo left femoral to anterior tibial bypass with evidence of hemorrhage-possible disruption proximal anastomosis  Post-op Diagnosis: Nonhealing left inguinal wound with deep hematoma and total disruption of proximal anastomosis the vein graft common femoral artery  Procedure: Procedure(s): Exploration left inguinal wound with proximal control common femoral artery, insertion patch angioplasty using bovine pericardial patch, revision of left femoral to anterior tibial bypass with reattachment of the vein to bovine patch, debridement left groin wound with primary closure of the majority of wound Anesthesia: General  EBL: 600 cc  Complications: None  Patient to the operating room placed in supine position at which time satisfactory general endotracheal anesthesia was administered. Left inguinal area and left lower extremity were prepped Betadine scrub and solution draped in routine sterile manner. There was a longitudinal incision in the left inguinal area. There is a segment in the proximal third laterally where there was obviously nonviable skin with some blistering this measured about 3 cm in diameter. There was some grossly bloody drainage coming from the very proximal portion of the wound which tracked down to the artery. This was semi-pertinent was cultured both aerobically and anaerobically. The incision was reopened being very careful not to injure the vein graft which had been placed proximally 3 weeks earlier and was a composite left cephalic and basilic vein graft to the anterior tibial artery performed for limb salvage. As we dissected deeper into the inguinal area was clear that there was a fresh hematoma extending down to the common femoral artery. Patient then was started with blood transfusions and I was able to get  proximal control of the very distal portion of the external iliac artery right inguinal ligament. The patient was then heparinized after occluding the artery the hematoma was evacuated and there was significant backbleeding coming from the profunda. This was controlled with manual compression as we carefully dissected this free. It was then clear that the vein graft was completely disrupted from the common femoral artery. Did not appear infected. Common femoral artery was good substance. Vein graft was completely transected preserving as much length as possible and the profunda and other branches were controlled from within using Fogarty catheters stopcocks. After this had been performed the vein graft was then carefully dissected free down about 6 cm. The attenuated thinned out portion of the vein near the anastomotic area was excised it did appear that there was enough length to reach the very distal end of the arthrotomy. The superficial femoral was totally occluded and it was excised. Are rotting was extended proximally up to the external iliac level. A bovine pericardial patch was obtained fashion appropriately and sewn in place with 5-0 Prolene. The vein was re\re spatulated and would just reach the very distal end of the patch. After longitudinal opening was made in the patch the vein was anastomosed end-to-side with 6-0 Prolene. Clamps were all released there was excellent pulse in the vein graft and good Doppler flow distally. The obvious areas of skin necrosis the lateral aspect of the wound was excised and all necrotic or marginal tissue in subcutaneous position was excised. This left a significant defect the lateral aspect of the proximal portion of the wound measuring about 3 cm in diameter. The wound was then irrigated and hemostasis achieved after history protamine and interrupted 2-0 Vicryl was placed in the deep level to cover the artery  and vein graft. This did not appear infected. The very  proximal and distal portion of the inguinal wound was able to be closed with interrupted 3-0 Vicryl and 7 3-0 nylon skin sutures. The midportion was packed open with moist saline gauze sterile dressing applied patient taken to recovery room in stable condition. He received 3 units of packed red blood cells during the procedure was stable hemodynamically. Procedure Details:   [name], MD 10/21/2012 2:20 PM

## 2012-10-21 NOTE — H&P (View-Only) (Signed)
Subjective:     Patient ID: [name], male   DOB: Jul 16, 1934, 78 y.o.   MRN: [id number]  HPI this 78 year old male returns today for continued complaints of some drainage from the left inguinal area and intermittent chills and fever. He had a redo left femoral anterior tibial bypass with composite cephalic and basilic vein from left arm performed on July 30 by me. He previously had a femoral-popliteal Gore-Tex graft on July 16 which occluded almost immediately. His bypass this time has remained patent but he continues to have problems with the left inguinal wound. He is not on any anticoagulants at present time. He is on doxycycline which she is taking twice a day.  Past Medical History  Diagnosis Date  . Hyperlipidemia   . Gout   . Benign prostatic hypertrophy   . Atrial fibrillation     sees Dr. Verdis Prime  . Chronic systolic dysfunction of left ventricle     a. mixed ischemic and nonischemic CM,  EF 35%. b. s/p AICD implantation.  . CHF (congestive heart failure)   . ED (erectile dysfunction)   . Arthritis   . Pacemaker     medtronic  . CAD (coronary artery disease)     a. s/p mid LAD stenting with DES 2008 Dr. Verdis Prime  . ICD (implantable cardiac defibrillator) in place     medtronic, Dr. Johney Frame  . ICD (implantable cardiac defibrillator) in place     due for check in Feb/2013  . Sleep apnea     hx of "had surgery for"  . Hypertension     sees Dr. Claris Che  . Type II diabetes mellitus   . Automatic implantable cardioverter-defibrillator in situ   . Depression   . Numbness and tingling     Hx; 70f left foot  . PAD (peripheral artery disease)     Severe by PV angiogram 09/2011  . Renal artery stenosis     s/p stenting 2009  . PAD (peripheral artery disease)     s/p multiple LLE bypass grafts; left mid-distal SCA occlusion by 08/2012 duplex    History  Substance Use Topics  . Smoking status: Never Smoker   . Smokeless tobacco: Never Used     Comment: 1-2 cigars  when golfing  . Alcohol Use: 2.4 oz/week    2 Glasses of wine, 2 Shots of liquor per week     Comment: 07/10/2012 "galss of wine or vodka tonic 3 X/wk"    Family History  Problem Relation Age of Onset  . Cancer      breast/fhx  . Heart disease      fhx  . Diabetes Neg Hx   . Cancer Father     Allergies  Allergen Reactions  . Other Other (See Comments)    Plastic tape tears skin    Current outpatient prescriptions:atorvastatin (LIPITOR) 10 MG tablet, Take 1 tablet (10 mg total) by mouth daily., Disp: 30 tablet, Rfl: 4;  carvedilol (COREG) 6.25 MG tablet, Take 1 tablet (6.25 mg total) by mouth 2 (two) times daily with a meal., Disp: 180 tablet, Rfl: 3;  doxycycline (VIBRA-TABS) 100 MG tablet, Take 1 tablet (100 mg total) by mouth 2 (two) times daily., Disp: 20 tablet, Rfl: 0 DULoxetine (CYMBALTA) 30 MG capsule, Take 1 capsule (30 mg total) by mouth daily., Disp: 30 capsule, Rfl: 5;  finasteride (PROSCAR) 5 MG tablet, Take 5 mg by mouth daily., Disp: , Rfl: ;  furosemide (LASIX) 40 MG tablet,  Take 1 tablet (40 mg total) by mouth daily., Disp: 90 tablet, Rfl: 3;  gabapentin (NEURONTIN) 300 MG capsule, Take 300 mg by mouth 3 (three) times daily., Disp: , Rfl:  glucose blood (ONE TOUCH ULTRA TEST) test strip, Use to test blood sugar 2 times daily as instructed. Dx code: 36.73, Disp: 100 each, Rfl: 3;  glucose blood test strip, Use as instructed, Disp: 100 each, Rfl: 4;  insulin aspart protamine-insulin aspart (NOVOLOG 70/30) (70-30) 100 UNIT/ML injection, Inject 10 Units into the skin 2 (two) times daily with a meal. 10 units with breakfast, and 10 units with the evening meal, Disp: , Rfl:  losartan (COZAAR) 100 MG tablet, Take 1 tablet (100 mg total) by mouth daily., Disp: 90 tablet, Rfl: 3;  NITROSTAT 0.4 MG SL tablet, Place 0.4 mg under the tongue every 5 (five) minutes as needed for chest pain. For chest pain, max 3 doses, Disp: , Rfl: ;  oxyCODONE-acetaminophen (PERCOCET/ROXICET) 5-325 MG per  tablet, Take 1-2 tablets by mouth every 4 (four) hours as needed., Disp: 40 tablet, Rfl: 0 warfarin (COUMADIN) 2.5 MG tablet, Take 1 tablet (2.5 mg total) by mouth See admin instructions. Take 2.5mg  daily except no coumadin on Thursdays, Disp: , Rfl:   BP 142/66  Pulse 72  Temp(Src) 97.8 F (36.6 C) (Oral)  Ht 5\' 8"  (1.727 m)  Wt 185 lb 9.6 oz (84.188 kg)  BMI 28.23 kg/m2  SpO2 100%  Body mass index is 28.23 kg/(m^2).           Review of Systems denies chest pain, dyspnea on exertion, PND, orthopnea, hemoptysis, has had intermittent chills over the past few weeks.    Objective:   Physical Exam BP 142/66  Pulse 72  Temp(Src) 97.8 F (36.6 C) (Oral)  Ht 5\' 8"  (1.727 m)  Wt 185 lb 9.6 oz (84.188 kg)  BMI 28.23 kg/m2  SpO2 100%  Gen.-alert and oriented x3 in no apparent distress HEENT normal for age Lungs no rhonchi or wheezing Cardiovascular regular rhythm no murmurs carotid pulses 3+ palpable no bruits audible Abdomen soft nontender no palpable masses Musculoskeletal free of  major deformities Skin clear -no rashes Neurologic normal Lower extremities left leg with ecchymosis and blistering of the lateral aspect of the left inguinal wound. I am able to express some brownish fluid and explored this with a swab down to the area of the common femoral artery. No purulent drainage noted. The femoral to anterior tibial graft continues to have an excellent pulse. He does have 1-2+ edema below the knee the left leg which is unchanged. All incisions the left upper extremity filled nicely.        Assessment:     Nonhealing of left inguinal wound 3 weeks post redo left femoral anterior tibial vein graft    Plan:     Will reexplore the left inguinal wound in the OR tomorrow-possibly leave open partially-possible VAC dressing

## 2012-10-21 NOTE — Anesthesia Preprocedure Evaluation (Addendum)
Anesthesia Evaluation  Patient identified by MRN, date of birth, ID band Patient awake    Reviewed: Allergy & Precautions, H&P , NPO status , Patient's Chart, lab work & pertinent test results, reviewed documented beta blocker date and time   Airway Mallampati: II TM Distance: >3 FB Neck ROM: Full    Dental  (+) Dental Advisory Given and Caps   Pulmonary sleep apnea ,    Pulmonary exam normal       Cardiovascular hypertension, Pt. on home beta blockers + CAD, + Peripheral Vascular Disease and +CHF + dysrhythmias Atrial Fibrillation + pacemaker + Cardiac Defibrillator     Neuro/Psych PSYCHIATRIC DISORDERS Depression negative neurological ROS     GI/Hepatic negative GI ROS, Neg liver ROS,   Endo/Other  diabetes, Insulin Dependent  Renal/GU Renal InsufficiencyRenal disease     Musculoskeletal   Abdominal   Peds  Hematology  (+) Blood dyscrasia, anemia ,   Anesthesia Other Findings   Reproductive/Obstetrics                          Anesthesia Physical Anesthesia Plan  ASA: III  Anesthesia Plan: General   Post-op Pain Management:    Induction: Intravenous  Airway Management Planned: LMA and Oral ETT  Additional Equipment:   Intra-op Plan:   Post-operative Plan: Extubation in OR  Informed Consent: I have reviewed the patients History and Physical, chart, labs and discussed the procedure including the risks, benefits and alternatives for the proposed anesthesia with the patient or authorized representative who has indicated his/her understanding and acceptance.   Dental advisory given  Plan Discussed with: Anesthesiologist and Surgeon  Anesthesia Plan Comments:      Anesthesia Quick Evaluation

## 2012-10-21 NOTE — Interval H&P Note (Signed)
History and Physical Interval Note:  10/21/2012 11:04 AM  [name]  has presented today for surgery, with the diagnosis of Nonhealing Left Groin Wound  The various methods of treatment have been discussed with the patient and family. After consideration of risks, benefits and other options for treatment, the patient has consented to  Procedure(s): EXPLORATION AND DEBRIDEMENT OF LEFT GROIN WOUND (Left) as a surgical intervention .  The patient's history has been reviewed, patient examined, no change in status, stable for surgery.  I have reviewed the patient's chart and labs.  Questions were answered to the patient's satisfaction.     [name]

## 2012-10-21 NOTE — Transfer of Care (Signed)
Immediate Anesthesia Transfer of Care Note  Patient: [name]  Procedure(s) Performed: Procedure(s): EXPLORATION AND DEBRIDEMENT OF LEFT GROIN WOUND (Left) PATCH ANGIOPLASTY (Left)  Patient Location: PACU  Anesthesia Type:General  Level of Consciousness: awake, alert  and oriented  Airway & Oxygen Therapy: Patient Spontanous Breathing and Patient connected to nasal cannula oxygen  Post-op Assessment: Report given to PACU RN, Post -op Vital signs reviewed and stable and Patient moving all extremities X 4  Post vital signs: Reviewed and stable  Complications: No apparent anesthesia complications

## 2012-10-21 NOTE — Preoperative (Signed)
Beta Blockers   Reason not to administer Beta Blockers:Not Applicable, pt took 8/20

## 2012-10-21 NOTE — Anesthesia Postprocedure Evaluation (Signed)
Anesthesia Post Note  Patient: [name]  Procedure(s) Performed: Procedure(s) (LRB): EXPLORATION AND DEBRIDEMENT OF LEFT GROIN WOUND (Left) PATCH ANGIOPLASTY (Left)  Anesthesia type: general  Patient location: PACU  Post pain: Pain level controlled  Post assessment: Patient's Cardiovascular Status Stable  Last Vitals:  Filed Vitals:   10/21/12 1515  BP: 119/53  Pulse:   Temp:   Resp: 22    Post vital signs: Reviewed and stable  Level of consciousness: sedated  Complications: No apparent anesthesia complications

## 2012-10-22 ENCOUNTER — Encounter (HOSPITAL_COMMUNITY): Payer: Self-pay | Admitting: Vascular Surgery

## 2012-10-22 ENCOUNTER — Ambulatory Visit: Payer: Medicare Other

## 2012-10-22 DIAGNOSIS — Z48812 Encounter for surgical aftercare following surgery on the circulatory system: Secondary | ICD-10-CM

## 2012-10-22 LAB — CBC
MCV: 81.9 fL (ref 78.0–100.0)
Platelets: 173 10*3/uL (ref 150–400)
RBC: 3.59 MIL/uL — ABNORMAL LOW (ref 4.22–5.81)
WBC: 8.2 10*3/uL (ref 4.0–10.5)

## 2012-10-22 LAB — BASIC METABOLIC PANEL
CO2: 18 mEq/L — ABNORMAL LOW (ref 19–32)
Chloride: 110 mEq/L (ref 96–112)
Potassium: 4.8 mEq/L (ref 3.5–5.1)
Sodium: 138 mEq/L (ref 135–145)

## 2012-10-22 LAB — HEMOGLOBIN A1C
Hgb A1c MFr Bld: 6.4 % — ABNORMAL HIGH (ref <5.7)
Mean Plasma Glucose: 137 mg/dL — ABNORMAL HIGH (ref <117)

## 2012-10-22 LAB — GLUCOSE, CAPILLARY: Glucose-Capillary: 117 mg/dL — ABNORMAL HIGH (ref 70–99)

## 2012-10-22 MED ORDER — BLISTEX EX OINT
TOPICAL_OINTMENT | CUTANEOUS | Status: DC | PRN
  Filled 2012-10-22: qty 10

## 2012-10-22 NOTE — Consult Note (Signed)
WOC consult Note Reason for Consult:Consult requested for left groin wound.  Pt is followed by [facility] service and they have ordered moist gauze packing to post-op incision area which is opened. Wound type: Full thickness Measurement:3X2X.8cm Wound ZOX:WRUEA purple-red Drainage (amount, consistency, odor) mod pink drainage, no odor Periwound:surrounded by sutures to outer wound which are well-approximated Dressing procedure/placement/frequency: Moist gauze packing has been ordered BID. Agree with present plan of care.  Foam dressing added over gauze to reduce tape stripping and discomfort with each dressing change.  Please re-consult if further assistance is needed.  Thank-you,  Cammie Mcgee MSN, RN, CWOCN, [place], CNS [phone]

## 2012-10-22 NOTE — Progress Notes (Addendum)
VASCULAR LAB PRELIMINARY  ARTERIAL  ABI completed: ABIs are non compressible, most likely due to calcified vessels from diabetes. Brachial BPs have a difference.    RIGHT    LEFT    PRESSURE WAVEFORM  PRESSURE WAVEFORM  BRACHIAL 134 Tri BRACHIAL 84 Mono  DP   DP    AT 119 mono AT 127 mono  PT 300 mono PT 259 mono  PER   PER    GREAT TOE  NA GREAT TOE  NA    RIGHT LEFT  ABI 2.26 1.95    Farrel Demark, RDMS, RVT  10/22/2012, 9:20 AM

## 2012-10-22 NOTE — Progress Notes (Signed)
Utilization review completed. Momodou Consiglio, RN, BSN. 

## 2012-10-22 NOTE — Progress Notes (Signed)
OT Cancellation Note  Patient Details Name: [name] MRN: [id number] DOB: 29-May-1934   Cancelled Treatment:    Reason Eval/Treat Not Completed: Patient declined, no reason specified. Will re-attempt later today.  10/22/2012 Cipriano Mile OTR/L Pager [phone] Office [phone]

## 2012-10-22 NOTE — Progress Notes (Signed)
Wet to dry dressing change completed to left groin site per order.  Wound bed red, swollen, 50% yellow exudate covering woundbed, distal incision bleeding.  4x4s with tape applied to secure dressing.  Wound consult ordered to ensure optimal quality care.  Will continue to monitor closely. Ave Filter

## 2012-10-22 NOTE — Progress Notes (Signed)
Transferred to 2W09 via WC and monitor, RN to receive in room

## 2012-10-22 NOTE — Progress Notes (Addendum)
[facility] [place]  Progress Note Bypass Surgery  Date of Surgery: 10/21/2012  Procedure(s): EXPLORATION AND DEBRIDEMENT OF LEFT GROIN WOUND PATCH ANGIOPLASTY Surgeon: Surgeon(s): Pryor Ochoa, MD  1 Day Post-Op  History of Present Illness  [name] is a 78 y.o. male who is  1 Day Post-Op. The patient is doing well. States his foot feel the same. Denies rest pain. Ambulated last PM. Patients pain is well controlled.    VASC. LAB Studies:        ZOX:WRUE  Significant Diagnostic Studies: CBC Lab Results  Component Value Date   WBC 8.2 10/22/2012   HGB 9.8* 10/22/2012   HCT 29.4* 10/22/2012   MCV 81.9 10/22/2012   PLT 173 10/22/2012    BMET    Component Value Date/Time   NA 138 10/22/2012 0525   K 4.8 10/22/2012 0525   CL 110 10/22/2012 0525   CO2 18* 10/22/2012 0525   GLUCOSE 139* 10/22/2012 0525   BUN 34* 10/22/2012 0525   CREATININE 2.08* 10/22/2012 0525   CREATININE 2.68* 09/07/2012 0812   CALCIUM 8.3* 10/22/2012 0525   GFRNONAA 29* 10/22/2012 0525   GFRAA 34* 10/22/2012 0525    COAG Lab Results  Component Value Date   INR 1.53* 10/21/2012   INR 4.0 10/08/2012   INR 2.68* 10/06/2012   No results found for this basename: PTT    Physical Examination  BP Readings from Last 3 Encounters:  10/22/12 126/64  10/22/12 126/64  10/20/12 142/66   Temp Readings from Last 3 Encounters:  10/22/12 98.3 F (36.8 C) Oral  10/22/12 98.3 F (36.8 C) Oral  10/20/12 97.8 F (36.6 C) Oral   SpO2 Readings from Last 3 Encounters:  10/22/12 98%  10/22/12 98%  10/20/12 100%   Pulse Readings from Last 3 Encounters:  10/22/12 70  10/22/12 70  10/20/12 72    Pt is A&O x 3 left lower extremity: Incision/s is/are clean,dry.intact, and  healing without hematoma, erythema. Min drainage on dressing Limb is warm; with good color Graft pulse is palpable lateral leg  Left Dorsalis Pedis pulse is monophasic by Doppler Left Posterior tibial pulse is   monophasic by Doppler  Assessment/Plan: Pt. Doing well Post-op pain is controlled CRI/CKD III - stable Acute blood loss anemia- H/H improved after transfusion PT/OT for ambulation Continue wound care as ordered Transfer to 2000  Latroy Gaymon J  10/22/2012 7:30 AM  Agree with above assessment Femoral tibial bypass graft is patent with palpable graft pulse and excellent dorsalis pedis flow with 1+ edema below the knee Left inguinal wound examined-proximal and distal portion appears to be healing satisfactorily-there is 3-1/2 cm defect laterally where the nonviable skin was excised the tissue beneath this is slightly dusky. Hematocrit stable at 29.5%-creatinine 2.0 Gram stain of fluid from left groin reveals gram positive cocci  I do not think the disruption of the vein graft from the common femoral artery was primarily an infectious problem but more due to the thin basilic vein which tore. There was fresh hematoma in the wound but no evidence of any purulent drainage from the depth of the wound.  Largest problem will be healing of the left inguinal wound-will need daily twice a day dressing changes and examination of the wound to look for need for debridement and continue Vanco and Zosyn until culture results are back

## 2012-10-22 NOTE — Progress Notes (Signed)
PT Cancellation Note  Patient Details Name: MANSOOR HILLYARD MRN: [id number] DOB: 24-Feb-1935   Cancelled Treatment:    Reason Eval/Treat Not Completed: Fatigue/lethargy limiting ability to participate. Patient pleasantly declining evaluation today. Just had bandage repacked and very fatigued. Asked to come back tomorrow.    Fabio Asa 10/22/2012, 2:46 PM

## 2012-10-22 NOTE — Clinical Documentation Improvement (Signed)
THIS DOCUMENT IS NOT A PERMANENT PART OF THE MEDICAL RECORD  Please update your documentation with the medical record to reflect your response to this query. If you need help knowing how to do this please call [phone].  10/22/12   Dear Rene Kocher Roczniak/Associates,  In a better effort to capture your patient's severity of illness, reflect appropriate length of stay and utilization of resources, a review of the patient medical record has revealed the following indicators.    Based on your clinical judgment, please clarify and document in a progress note and/or discharge summary the clinical condition associated with the following supporting information:    Possible Clinical Conditions?   _______CKD Stage III - GFR 30-59 _______CKD Stage IV - GFR 15-29 _______CKD Stage V - GFR < 15 _______Other condition _______Cannot Clinically determine     Risk Factors:  CRI stable, noted per 8/21 progress notes.   Lab:  Bun: 8/21:  34 8/20:  36  Creat: 8/21:  2.08 8/20:  1.87  GFR:  8/21:  29 8/20:  33   You may use possible, probable, or suspect with inpatient documentation. possible, probable, suspected diagnoses MUST be documented at the time of discharge  Reviewed:CKD, stage III documented per 8/22 progress notes.   Thank You,  Marciano Sequin,  Clinical Documentation Specialist: [phone] Health Information Management [place]

## 2012-10-22 NOTE — Progress Notes (Signed)
Attempted to call report to 2W 

## 2012-10-22 NOTE — Progress Notes (Signed)
Report called to 2W RN 

## 2012-10-23 LAB — CLOSTRIDIUM DIFFICILE BY PCR: Toxigenic C. Difficile by PCR: NEGATIVE

## 2012-10-23 LAB — POCT I-STAT 4, (NA,K, GLUC, HGB,HCT)
Glucose, Bld: 145 mg/dL — ABNORMAL HIGH (ref 70–99)
Potassium: 5.1 mEq/L (ref 3.5–5.1)

## 2012-10-23 LAB — GLUCOSE, CAPILLARY
Glucose-Capillary: 155 mg/dL — ABNORMAL HIGH (ref 70–99)
Glucose-Capillary: 207 mg/dL — ABNORMAL HIGH (ref 70–99)

## 2012-10-23 NOTE — Progress Notes (Signed)
Dressing changed per order of left groin.  2x2 wet gauze placed at base of wound covered with 4x4 and mepalex.  Pt. Medicated with Tylenol per pt. Request.  Pt. Tolerated well,call bell at bedside, will continue to monitor.   Thane Edu, RN

## 2012-10-23 NOTE — Clinical Documentation Improvement (Signed)
THIS DOCUMENT IS NOT A PERMANENT PART OF THE MEDICAL RECORD  Please update your documentation with the medical record to reflect your response to this query. If you need help knowing how to do this please call [phone].  10/23/12   Dear Beverely Low / Associates,  In a better effort to capture your patient's severity of illness, reflect appropriate length of stay and utilization of resources, a review of the patient medical record has revealed the following indicators.    Based on your clinical judgment, please clarify and document in a progress note and/or discharge summary the clinical condition associated with the following supporting information:   Because there is documentation in the medical record of "debridement" clarification is needed.   Please document the below (4) key elements in a progress note:  1.  Type of Debridement:          Excisional Debridement-  Cutting away necrotic, devitalized tissue or slough to  Level of viable tissue using a sharp instrument (example, scalpel, scissors, etc).           NON Excisional Debridement-  The removal of necrotic, devitalized tissue or slough by means of scraping, mechanical brushing, flushing, or washing. (example: irrigation, whirlpool);  Minor removal of loose fragments  2.  Please indicate instrument used:  Scissors, Scalpel, Curette, or other  3.  Please document depth of the debridement:             Partial Thickness  Full Thickness  Skin and Subcutaneous Tissue  Subcutaneous Tissue and Muscle  Subcutaneous Tissue, Muscle and Bone  Other  4.  Document the size of the debridement.  You may use possible, probable, or suspect with inpatient documentation. possible, probable, suspected diagnoses MUST be documented at the time of discharge  Reviewed: Additional documentation provided per 8/22 progress notes.       Thank You,  Marciano Sequin,  Clinical Documentation Specialist: [phone] Health  Information Management [address]

## 2012-10-23 NOTE — Progress Notes (Signed)
[name] is being followed by [facility] Care Management services. Spoke with [name] at bedside to make aware that his [facility] Coordinator is aware of his admission. Will follow up with patient post hospital discharge. Left contact information at bedside. Raiford Noble, MSN-Ed, RN,BSN- [facility] [phone]

## 2012-10-23 NOTE — Progress Notes (Signed)
Occupational Therapy Evaluation Patient Details Name: [name] MRN: [id number] DOB: 10-19-1934 Today's Date: 10/23/2012 Time: 1352-1410 OT Time Calculation (min): 18 min  OT Assessment / Plan / Recommendation History of present illness Pt with L inguinal wound after recent LLE bypass graft. Pt with history of multiple bypass grafts.   Clinical Impression   Pt able to demonstrate ability to complete ADL and mobility for ADL safely. Will have supervision from girlfriend at D/C. Rec for pt to use shower seat to reduce risk of falls. No further OT needed.    OT Assessment  Patient does not need any further OT services    Follow Up Recommendations  No OT follow up;Supervision - Intermittent    Barriers to Discharge      Equipment Recommendations  Tub/shower seat    Recommendations for Other Services    Frequency       Precautions / Restrictions Precautions Precautions: None Restrictions Weight Bearing Restrictions: No   Pertinent Vitals/Pain no apparent distress     ADL  Eating/Feeding: Independent Where Assessed - Eating/Feeding: Edge of bed Grooming: Performed;Shaving;Supervision/safety Where Assessed - Grooming: Unsupported standing Where Assessed - Upper Body Dressing: Unsupported sitting Lower Body Dressing: Set up Where Assessed - Lower Body Dressing: Unsupported sitting;Supported sit to stand Toilet Transfer: Modified independent;Simulated Toilet Transfer Method: Sit to Barista: Regular height toilet;Grab bars Toileting - Clothing Manipulation and Hygiene: Modified independent;Simulated Where Assessed - Engineer, mining and Hygiene: Standing Transfers/Ambulation Related to ADLs: S ADL Comments: aBLE TO DO WITH INCREASED TIME    OT Diagnosis:    OT Problem List:   OT Treatment Interventions:     OT Goals(Current goals can be found in the care plan section)    Visit Information  Last OT Received On:  10/23/12 Assistance Needed: +1 PT/OT Co-Evaluation/Treatment: Yes Reason Eval/Treat Not Completed: Patient declined, no reason specified History of Present Illness: Pt with L inguinal wound after recent LLE bypass graft. Pt with history of multiple bypass grafts.       Prior Functioning     Home Living Family/patient expects to be discharged to:: Private residence Living Arrangements: Spouse/significant other Available Help at Discharge: Family;Available 24 hours/day Type of Home: House Home Access: Level entry Home Layout: One level Home Equipment: Cane - single point Prior Function Level of Independence: Independent with assistive device(s) Comments: delivers food  Communication Communication: No difficulties Dominant Hand: Right         Vision/Perception Vision - History Baseline Vision: Wears glasses only for reading   Cognition  Cognition Arousal/Alertness: Awake/alert Behavior During Therapy: WFL for tasks assessed/performed Overall Cognitive Status: Within Functional Limits for tasks assessed    Extremity/Trunk Assessment Upper Extremity Assessment Upper Extremity Assessment: Overall WFL for tasks assessed Lower Extremity Assessment LLE Deficits / Details: grossly 3+/5 due to soreness Cervical / Trunk Assessment Cervical / Trunk Assessment: Normal     Mobility Bed Mobility Bed Mobility: Sit to Supine Supine to Sit: 6: Modified independent (Device/Increase time) Sitting - Scoot to Edge of Bed: 6: Modified independent (Device/Increase time) Sit to Supine: 6: Modified independent (Device/Increase time);HOB flat Details for Bed Mobility Assistance: Pt returned to bed end of session without difficulty Transfers Transfers: Sit to Stand;Stand to Sit Sit to Stand: 6: Modified independent (Device/Increase time);From chair/3-in-1 Stand to Sit: 6: Modified independent (Device/Increase time);To bed     Exercise Other Exercises Other Exercises: R ankle ROM  exercises   Balance  mod I   End of Session  OT - End of Session Equipment Utilized During Treatment:  (cane) Activity Tolerance: Patient tolerated treatment well Patient left: in bed;with call bell/phone within reach Nurse Communication: Mobility status  GO     Tejal Monroy,HILLARY 10/23/2012, 2:15 PM [facility], OTR/L  [phone] 10/23/2012

## 2012-10-23 NOTE — Care Management Note (Unsigned)
    Page 1 of 2   11/17/2012     3:49:26 PM   CARE MANAGEMENT NOTE 11/17/2012  Patient:  [name], [name]   Account Number:  [id number]  Date Initiated:  10/23/2012  Documentation initiated by:  Hammond Obeirne  Subjective/Objective Assessment:   PT ADM ON 10/21/12 WITH LT GROIN INFECTION.  PTA, PT INDEPENDENT, LIVES WITH GIRLFRIEND.     Action/Plan:   WILL FOLLOW FOR DISCHARGE NEEDS AS PT PROGRESSES.   Anticipated DC Date:  11/13/2012   Anticipated DC Plan:  HOME W HOME HEALTH SERVICES      DC Planning Services  CM consult      [facility] Choice  HOME HEALTH   Choice offered to / List presented to:  C-1 Patient   DME arranged  VAC      DME agency  [facility]     HH arranged  HH-1 RN      [facility] agency  [facility]   Status of service:  In process, will continue to follow Medicare Important Message given?   (If response is "NO", the following Medicare IM given date fields will be blank) Date Medicare IM given:   Date Additional Medicare IM given:    Discharge Disposition:  HOME W HOME HEALTH SERVICES  Per UR Regulation:  Reviewed for med. necessity/level of care/duration of stay  If discussed at Long Length of Stay Meetings, dates discussed:   10/27/2012  10/29/2012  11/05/2012  11/10/2012  11/12/2012  11/17/2012    Comments:  11/17/12 [name] Sehgal,RN,BSN [phone] FAXED COMPLETED AND SIGNED WOUND VAC INSURANCE AUTH FORMS TO [facility]; [place] ORDER, AS PT FOR DC TOMORROW.  REFERRAL TO [facility], PER PT CHOICE.  START OF CARE 24-48H POST DC DATE. PLAN DC HOME WED WITH FAMILY MEMBERS.  PT ANXIOUS ABOUT VAC BEING DELIVERED IN A TIMELY MANNER; OFFERED REASSURANCE.  11/16/12 Jailyn Langhorst,RN,BSN [phone] WOUND VAC PLACED TODAY; WILL NEED HHRN AT DC FOR VAC CHANGES.  [facility] WOUND VAC INSURANCE AUTH FORMS LEFT IN CHART FOR MD SIGNATURE.  11/11/12- 1030- Donn Pierini Charity fundraiser, BSN [phone] Pt for OR tomorrow for partial wound closure- in to speak with pt and son at bedside regarding d/c plans- list  of both HH agencies for [facility] and [facility] List given to pt/son- discussed HH vs private duty and what insurance covers- pt will most likely need HH-RN for wound care- ambulates independently - so do not anticipate any DME needs - unless VAC placed prior to discharge. per MD notes there is no plan for VAC at this time. Son did ask about transportation options- will have CSW provide any information regarding transportation options. NCM to cont. to follow for d/c needs-  11/09/12- 1430- Donn Pierini RN, BSN [phone] meticulous wound care and  guarded situation.  need for vein graft to be covered before placing wound vac-- family wants to speak to CSW regarding potential SNF options vs HH. gave CSW Dylin Breeden #- [phone]  11/05/12- 1100- Donn Pierini RN, BSN [phone] Exposed vein graft at base of wound with potential for disruption and bleeding--for bedside debridement today by MD- cont. drsg. changes.   10/28/12 Myla Mauriello,RN,BSN [phone] CONT DRESSING CHANGES TID WITH IV VANCOMYCIN AND WOUND DEBRIDEMENTS.  PLEASE NOTIFY CASE MANAGER IF IV ANTIBIOTICS NEEDED AT DC, OR WOUND VAC THERAPY.  THANKS.

## 2012-10-23 NOTE — Evaluation (Signed)
Physical Therapy Evaluation Patient Details Name: [name] MRN: [id number] DOB: 03-21-1934 Today's Date: 10/23/2012 Time: 4098-1191 PT Time Calculation (min): 12 min  PT Assessment / Plan / Recommendation History of Present Illness  Pt with L inguinal wound after recent LLE bypass graft. Pt with history of multiple bypass grafts.  Clinical Impression  Pt pleasant and reports that current function is the same as last discharge. Pt able to ambulate without AD without LOB but noted antalgic gait. Pt educated for long arc quads, stretching leg throughout the day, elevation at rest, and use of cane acutely for added stability with altered gait. Pt verbalized understanding and agreeable to no further therapy needs at this time. Will sign off and recommend daily ambulation acutely to maximize function.     PT Assessment  Patent does not need any further PT services    Follow Up Recommendations  No PT follow up    Does the patient have the potential to tolerate intense rehabilitation      Barriers to Discharge        Equipment Recommendations  None recommended by PT    Recommendations for Other Services     Frequency      Precautions / Restrictions Precautions Precautions: None   Pertinent Vitals/Pain 2-3/10 left groin pain      Mobility  Bed Mobility Bed Mobility: Sit to Supine Sit to Supine: 6: Modified independent (Device/Increase time);HOB flat Details for Bed Mobility Assistance: Pt returned to bed end of session without difficulty Transfers Sit to Stand: 6: Modified independent (Device/Increase time);From chair/3-in-1 Stand to Sit: 6: Modified independent (Device/Increase time);To bed Ambulation/Gait Ambulation/Gait Assistance: 6: Modified independent (Device/Increase time) Ambulation Distance (Feet): 300 Feet Assistive device: None Ambulation/Gait Assistance Details: Pt with decreased stride and noted pain on LLE with stance but pt able to complete ambulation  without assist or LOB and recommended use of cane at home again for increased safety with antalgic gait Gait Pattern: Decreased stance time - left;Antalgic;Step-through pattern Gait velocity: decreased Stairs: No    Exercises     PT Diagnosis:    PT Problem List:   PT Treatment Interventions:       PT Goals(Current goals can be found in the care plan section) Acute Rehab PT Goals PT Goal Formulation: No goals set, d/c therapy  Visit Information  Last PT Received On: 10/23/12 Assistance Needed: +1 History of Present Illness: Pt with L inguinal wound after recent LLE bypass graft. Pt with history of multiple bypass grafts.       Prior Functioning  Home Living Family/patient expects to be discharged to:: Private residence Living Arrangements: Spouse/significant other Available Help at Discharge: Family;Available 24 hours/day Type of Home: House Home Access: Level entry Home Layout: One level Home Equipment: Cane - single point Prior Function Level of Independence: Independent with assistive device(s) Communication Communication: No difficulties Dominant Hand: Right    Cognition  Cognition Arousal/Alertness: Awake/alert Behavior During Therapy: WFL for tasks assessed/performed Overall Cognitive Status: Within Functional Limits for tasks assessed    Extremity/Trunk Assessment Upper Extremity Assessment Upper Extremity Assessment: Overall WFL for tasks assessed Lower Extremity Assessment LLE Deficits / Details: grossly 3+/5 due to soreness Cervical / Trunk Assessment Cervical / Trunk Assessment: Normal   Balance    End of Session PT - End of Session Equipment Utilized During Treatment: Gait belt Activity Tolerance: Patient tolerated treatment well Patient left: in bed;with call bell/phone within reach Nurse Communication: Mobility status  GP     [name],  [name] 10/23/2012, 12:08 PM Delaney Meigs, PT [phone]

## 2012-10-23 NOTE — Progress Notes (Signed)
[facility] [place]  Subjective  - Less leg swelling   Objective 127/71 70 99 F (37.2 C) (Oral) 18 98%  Intake/Output Summary (Last 24 hours) at 10/23/12 1004 Last data filed at 10/23/12 0200  Gross per 24 hour  Intake    240 ml  Output    750 ml  Net   -510 ml   Palpable lateral graft pulse, left foot warm leg incisions healing Left groin wound with necrotic skin edge and base.  Locally debrided sharply at bedside  Assessment/Planning: POD #s/p revision of fem AT bypass.  Still with tenuous groin incision.  Will need further debridement and local wound care.  Follow up cultures to tailor antibiotics.  Some loose stools if persists will check c diff  Ambulate  Acute blood loss anemia hemoglobin 11 to 10 follow for now  Renal insufficiency creatinine seems around 2 baseline follow  Lety Cullens E 10/23/2012 10:04 AM --  Laboratory Lab Results:  Recent Labs  10/21/12 1743 10/22/12 0525  WBC 9.4 8.2  HGB 11.5* 9.8*  HCT 34.1* 29.4*  PLT 190 173   BMET  Recent Labs  10/21/12 0854 10/21/12 1743 10/22/12 0525  NA 139  --  138  K 4.5  --  4.8  CL 109  --  110  CO2 18*  --  18*  GLUCOSE 134*  --  139*  BUN 36*  --  34*  CREATININE 2.09* 1.87* 2.08*  CALCIUM 8.9  --  8.3*    COAG Lab Results  Component Value Date   INR 1.53* 10/21/2012   INR 4.0 10/08/2012   INR 2.68* 10/06/2012   No results found for this basename: PTT

## 2012-10-24 LAB — CULTURE, ROUTINE-ABSCESS

## 2012-10-24 LAB — GLUCOSE, CAPILLARY
Glucose-Capillary: 90 mg/dL (ref 70–99)
Glucose-Capillary: 139 mg/dL — ABNORMAL HIGH (ref 70–99)

## 2012-10-24 LAB — VANCOMYCIN, TROUGH: Vancomycin Tr: 19.2 ug/mL (ref 10.0–20.0)

## 2012-10-24 MED ORDER — VANCOMYCIN HCL IN DEXTROSE 1-5 GM/200ML-% IV SOLN
1000.0000 mg | INTRAVENOUS | Status: DC
  Administered 2012-10-25 – 2012-10-28 (×4): 1000 mg via INTRAVENOUS
  Filled 2012-10-24 (×4): qty 200

## 2012-10-24 NOTE — Progress Notes (Signed)
CRITICAL VALUE ALERT  Critical value received:  + MRSA wound culture  Date of notification:  10/24/12  Time of notification:  1123  Critical value read back:yes  Nurse who received alert:  Casilda Carls RN  MD notified (1st page):  Dr Darrick Penna   Time of first page:  1133  MD notified (2nd page):  Time of second page:  Responding MD:  Dr. Darrick Penna  Time MD responded:  [phone]

## 2012-10-24 NOTE — Progress Notes (Addendum)
[facility] [place]  Subjective  - Doing ok, no new complaints.  He wants to go home as soon as he can.   Objective 141/72 69 98.4 F (36.9 C) (Oral) 20 99%  Intake/Output Summary (Last 24 hours) at 10/24/12 0758 Last data filed at 10/24/12 0400  Gross per 24 hour  Intake    480 ml  Output   1250 ml  Net   -770 ml    Left Heel 1 mm ulcer s/p blister clean. Groin edges min. Yellow eschar, graft not visible in wound bed. Skin warm to touch  Assessment/Planning: Procedure(s): EXPLORATION AND DEBRIDEMENT OF LEFT GROIN WOUND PATCH ANGIOPLASTY Revision of fem AT bypass  3 Days Post-OpSurgeon(s): Pryor Ochoa, MD  Continue antibiotics Keep heel off the bed left LE C dif neg   Thomasena Edis [facility] 10/24/2012 7:58 AM --  Laboratory Lab Results:  Recent Labs  10/21/12 1743 10/22/12 0525  WBC 9.4 8.2  HGB 11.5* 9.8*  HCT 34.1* 29.4*  PLT 190 173   BMET  Recent Labs  10/21/12 0854 10/21/12 1305 10/21/12 1743 10/22/12 0525  NA 139 141  --  138  K 4.5 5.1  --  4.8  CL 109  --   --  110  CO2 18*  --   --  18*  GLUCOSE 134* 145*  --  139*  BUN 36*  --   --  34*  CREATININE 2.09*  --  1.87* 2.08*  CALCIUM 8.9  --   --  8.3*    COAG Lab Results  Component Value Date   INR 1.53* 10/21/2012   INR 4.0 10/08/2012   INR 2.68* 10/06/2012   No results found for this basename: PTT   Agree with above, palpable graft pulse lateral leg Continue local wound care Awaiting culture sensitivity Ambulate Home when groin wound improved  Fabienne Bruns, MD [facility] [place] Office: [phone] Pager: [phone]

## 2012-10-24 NOTE — Progress Notes (Signed)
Pt's CBG 71. Pt advised to eat pm snack. Will continue to monitor.   Kelson Queenan,RN,MSN

## 2012-10-24 NOTE — Progress Notes (Addendum)
ANTIBIOTIC CONSULT NOTE - Follow Up  Pharmacy Consult for Vancomycin Indication: infected wound left groin  Allergies  Allergen Reactions  . Other Other (See Comments)    Plastic tape tears skin    Patient Measurements: Height: 5\' 8"  (172.7 cm) Weight: 186 lb 1.1 oz (84.4 kg) IBW/kg (Calculated) : 68.4  Vital Signs: Temp: 98 F (36.7 C) (08/23 1400) Temp src: Oral (08/23 1400) BP: 126/72 mmHg (08/23 1400) Pulse Rate: 77 (08/23 1400)  Intake/Output from this shift: Total I/O In: 360 [P.O.:360] Out: 250 [Urine:250]  Labs:  Estimated Creatinine Clearance: 32 ml/min (by C-G formula based on Cr of 2.08).   Microbiology:   8/20 - abscess culture -  Medical History: Past Medical History  Diagnosis Date  . Hyperlipidemia   . Gout   . Benign prostatic hypertrophy   . Atrial fibrillation     sees Dr. Verdis Prime  . Chronic systolic dysfunction of left ventricle     a. mixed ischemic and nonischemic CM,  EF 35%. b. s/p AICD implantation.  . CHF (congestive heart failure)   . ED (erectile dysfunction)   . Arthritis   . Pacemaker     medtronic  . CAD (coronary artery disease)     a. s/p mid LAD stenting with DES 2008 Dr. Verdis Prime  . ICD (implantable cardiac defibrillator) in place     medtronic, Dr. Johney Frame  . ICD (implantable cardiac defibrillator) in place     due for check in Feb/2013  . Sleep apnea     hx of "had surgery for"  . Hypertension     sees Dr. Claris Che  . Type II diabetes mellitus   . Automatic implantable cardioverter-defibrillator in situ   . Depression   . Numbness and tingling     Hx; 64f left foot  . PAD (peripheral artery disease)     Severe by PV angiogram 09/2011  . Renal artery stenosis     s/p stenting 2009  . PAD (peripheral artery disease)     s/p multiple LLE bypass grafts; left mid-distal SCA occlusion by 08/2012 duplex   Assessment: 77 yo M admitted 10/21/2012 for drainage from left inguinal area, fever, chills non-healing left  inguinal wound s/p re-do left fem-ant tibial BPG (09/30/12).Also had fem-pop BPG on 09/16/12, which occluded almost immediately. Pharmacy consulted to dose vancomycin.   PMH: Deep hematoma and total disruption of proximal anastamosis. s/p exploration and debridement of left groin wound, patch angioplasty on 8/20. HLD, Gout, BPH, Afib, CHF, ED, CAD, ICD, OSA, HTN, Depression, PAD  Events: Cultures positive for MRSA, follow up Vanc trough  Anticoagulation -DVT UJ:WJXB 40 daily; follow SCr for dose adjustment PTA Warfarin 2.5 mg daily except none Thursdays, stopped 7/30  Infectious Disease - Groin infection, WBC 8.2, afb Vanc 8/20>> Zosyn 8/20>>8/23 Chlorhexidine/mupirocin 8/20> Zinacef pre-op 8/20 Doxy PTA  8/20 - abscess> MRSA  Cardiovascular: CAD, AFib, HTN, hyperlipidemia: atorva, coreg, losartan, furo 40 daily:: BP at goal, HR 60-70 Endocrinology: DM: SSI, 70/30 bid; Gout GI: protonix Neurology: depression: gabapentin, duloxetine Nephrology - Scr 2.08 today, crcl ~30-35  PTA Medication Issues: Home medications not ordered: warfarin Best Practices: Enox, protonix  Goal: Vancomycin trough 15-20 mcg/ml  Plan: 1. Follow up vancomycin trough prior to 1800 dose today. 2. Follow up SCr, UOP, cultures, clinical course and adjust as clinically indicated.     Thank you for allowing pharmacy to be a part of this patients care team.  Lovenia Kim Pharm.D., BCPS Clinical Pharmacist  10/24/2012 2:32 PM Pager: [phone] Phone: [phone]   ADDENDUM: Vanc trough resulted back tonight at 19.59mcg/mL on 1250mg  IV Q24h. Patient's last BMET on 8/21 did show some renal dysfunction. Abscess is growing MRSA which is sensitive to vanc.  Goal: Vancomycin trough 15-20 mcg/ml  Plan; -reduce vanc to 1000mg  IV Q24 starting with tomorrow's dose as tonight's dose has been hung -order BMET for tomorrow to assess patient's renal function and determine if dose needs to be decreased  more  Maria Coin D. Maudine Kluesner, PharmD Clinical Pharmacist Pager: [phone] 10/24/2012 7:22 PM

## 2012-10-24 NOTE — Progress Notes (Signed)
Pt's dressing changed per MD order. Moderate serous drainage present. Will continue to monitor.   Partick Musselman,RN,MSN

## 2012-10-25 LAB — TYPE AND SCREEN
Unit division: 0
Unit division: 0
Unit division: 0
Unit division: 0
Unit division: 0

## 2012-10-25 LAB — GLUCOSE, CAPILLARY
Glucose-Capillary: 205 mg/dL — ABNORMAL HIGH (ref 70–99)
Glucose-Capillary: 40 mg/dL — CL (ref 70–99)
Glucose-Capillary: 55 mg/dL — ABNORMAL LOW (ref 70–99)
Glucose-Capillary: 59 mg/dL — ABNORMAL LOW (ref 70–99)

## 2012-10-25 LAB — BASIC METABOLIC PANEL
BUN: 20 mg/dL (ref 6–23)
Creatinine, Ser: 1.97 mg/dL — ABNORMAL HIGH (ref 0.50–1.35)
GFR calc Af Amer: 36 mL/min — ABNORMAL LOW (ref >90)
GFR calc non Af Amer: 31 mL/min — ABNORMAL LOW (ref >90)

## 2012-10-25 MED ORDER — DEXTROSE 50 % IV SOLN
INTRAVENOUS | Status: AC
  Filled 2012-10-25: qty 50

## 2012-10-25 MED ORDER — GLUCOSE 40 % PO GEL
ORAL | Status: AC
  Filled 2012-10-25: qty 1

## 2012-10-25 NOTE — Care Management Note (Addendum)
Hypoglycemic Event  CBG: 55  Treatment: gluco gel  Symptoms: cold Follow-up CBG: Time: 2300 CBG Result: 40 Patient given 15g of carbs, CBG rechecked 15 minutes later, CBG 59  Possible Reasons for Event:  Comments/MD notified: Pt given 25 mg of glucose IV, Nurse tech to recheck CBG again in 15 minutes  CBG 222      Sharmon Leyden  Remember to initiate Hypoglycemia Order Set & complete

## 2012-10-25 NOTE — Progress Notes (Signed)
[facility]  Subjective  - Wants to go home   Objective 102/68 69 97.7 F (36.5 C) (Oral) 18 99%  Intake/Output Summary (Last 24 hours) at 10/25/12 0800 Last data filed at 10/25/12 0427  Gross per 24 hour  Intake    360 ml  Output    850 ml  Net   -490 ml   Palpable lateral graft pulse, left foot warm leg incisions healing Left groin wound with necrotic skin edge and base.  Locally debrided sharply at bedside  Assessment/Planning: POD #s/p revision of fem AT bypass.  Still with tenuous groin incision.  Will need further debridement and local wound care.  Culture MRSA Zosyn stopped yesterday.  Ambulate  Dr Hart Rochester to review tomorrow to determine need for further debridement and length of Vanc course  Trenell Concannon E 10/25/2012 8:00 AM --  Laboratory Lab Results: No results found for this basename: WBC, HGB, HCT, PLT,  in the last 72 hours BMET  Recent Labs  10/25/12 0500  NA 137  K 3.6  CL 105  CO2 23  GLUCOSE 92  BUN 20  CREATININE 1.97*  CALCIUM 8.6    COAG Lab Results  Component Value Date   INR 1.53* 10/21/2012   INR 4.0 10/08/2012   INR 2.68* 10/06/2012   No results found for this basename: PTT

## 2012-10-26 LAB — ANAEROBIC CULTURE

## 2012-10-26 LAB — GLUCOSE, CAPILLARY: Glucose-Capillary: 222 mg/dL — ABNORMAL HIGH (ref 70–99)

## 2012-10-26 MED ORDER — INSULIN ASPART PROT & ASPART (70-30 MIX) 100 UNIT/ML ~~LOC~~ SUSP
8.0000 [IU] | Freq: Two times a day (BID) | SUBCUTANEOUS | Status: DC
  Administered 2012-10-26 – 2012-11-18 (×44): 8 [IU] via SUBCUTANEOUS
  Filled 2012-10-26: qty 10

## 2012-10-26 MED ORDER — INSULIN ASPART 100 UNIT/ML ~~LOC~~ SOLN
0.0000 [IU] | Freq: Three times a day (TID) | SUBCUTANEOUS | Status: DC
  Administered 2012-10-26: 17:00:00 via SUBCUTANEOUS
  Administered 2012-10-27: 2 [IU] via SUBCUTANEOUS
  Administered 2012-10-28: 1 [IU] via SUBCUTANEOUS
  Administered 2012-10-29: 2 [IU] via SUBCUTANEOUS
  Administered 2012-10-30 – 2012-10-31 (×4): 1 [IU] via SUBCUTANEOUS
  Administered 2012-11-01 – 2012-11-02 (×4): 2 [IU] via SUBCUTANEOUS
  Administered 2012-11-03 (×3): 1 [IU] via SUBCUTANEOUS
  Administered 2012-11-04: 2 [IU] via SUBCUTANEOUS
  Administered 2012-11-04 – 2012-11-05 (×3): 1 [IU] via SUBCUTANEOUS
  Administered 2012-11-07: 2 [IU] via SUBCUTANEOUS
  Administered 2012-11-07: 1 [IU] via SUBCUTANEOUS
  Administered 2012-11-08: 2 [IU] via SUBCUTANEOUS
  Administered 2012-11-08: 1 [IU] via SUBCUTANEOUS
  Administered 2012-11-09: 2 [IU] via SUBCUTANEOUS
  Administered 2012-11-09: 1 [IU] via SUBCUTANEOUS
  Administered 2012-11-09 – 2012-11-11 (×4): 2 [IU] via SUBCUTANEOUS
  Administered 2012-11-12: 3 [IU] via SUBCUTANEOUS
  Administered 2012-11-13 (×2): 1 [IU] via SUBCUTANEOUS
  Administered 2012-11-14: 3 [IU] via SUBCUTANEOUS
  Administered 2012-11-14: 2 [IU] via SUBCUTANEOUS
  Administered 2012-11-15: 1 [IU] via SUBCUTANEOUS
  Administered 2012-11-15: 3 [IU] via SUBCUTANEOUS
  Administered 2012-11-16: 2 [IU] via SUBCUTANEOUS
  Administered 2012-11-17 (×2): 1 [IU] via SUBCUTANEOUS

## 2012-10-26 NOTE — Progress Notes (Signed)
Inpatient Diabetes Program Recommendations  AACE/ADA: New Consensus Statement on Inpatient Glycemic Control (2013)  Target Ranges:  Prepandial:   less than 140 mg/dL      Peak postprandial:   less than 180 mg/dL (1-2 hours)      Critically ill patients:  140 - 180 mg/dL   Reason for Assessment: Hypoglycemia  Results for [name], [name] (MRN [id number]) as of 10/26/2012 11:49  Ref. Range 10/25/2012 06:15 10/25/2012 11:02 10/25/2012 16:25 10/25/2012 21:38 10/25/2012 22:18 10/25/2012 22:44 10/26/2012 00:27 10/26/2012 06:35 10/26/2012 11:12  Glucose-Capillary Latest Range: 70-99 mg/dL 92 409 (H) 811 (H) 55 (L) 40 (LL) 59 (L) 222 (H) 103 (H) 130 (H)   Note:  Patient has renal insufficiency-- GFR of 31.  On 70/30 10 units BID and moderate correction scale.  When patient's CBG has been low normal, has not consistently received dose of 70/30.  Later, patient requires correction scale and has had hypoglycemia.  Please consider lowering 70/30 dosage to 8 units BID and reducing correction to sensitive scale tid.  Thank you.  Omeed Osuna S. Elsie Lincoln, RN, CNS, CDE Inpatient Diabetes Program, team pager [phone]

## 2012-10-26 NOTE — Progress Notes (Signed)
Dressing change completed per MD order, pt tolerated activity well. Pt also states that he does not want his morning dressing change done being that Dr. Lawson is coming to debride the wound in the morning. Will continue to monitor   [name] M  

## 2012-10-26 NOTE — Progress Notes (Signed)
Patient ID: [name], male   DOB: [birth year], 78 y.o.   MRN: [id number] Vascular Surgery Progress Note  Subjective: 5 days post evacuation and debridement left groin with revision left femoral anterior tibial composite left arm vein graft. Patient receiving twice a day dressing changes with moist saline gauze. Patient has MRSA growing from the cultures at the time of surgery. I think this was secondary infection of the hematoma which was the primary issue.. Patient states that he is able to ambulate with less edema in the left leg on a daily basis in the left inguinal area is feeling better.  Objective:  Filed Vitals:   10/26/12 0544  BP: 132/59  Pulse: 70  Temp: 98.3 F (36.8 C)  Resp: 18    General well-developed well-nourished male no apparent stress alert and oriented x3 3+ pulse in left femoral anterior tibial lateral subcutaneous graft. 1+ edema below the knee. Left inguinal area examined and dressing re\re changed. There is some subcutaneous fat necrosis in the open area the midportion the wound which measures about 3 cm in diameter. This was sharply debrided today. The deep interrupted Vicryl sutures are intact. Wound is slowly improving.   Labs:  Recent Labs Lab 10/21/12 1743 10/22/12 0525 10/25/12 0500  CREATININE 1.87* 2.08* 1.97*    Recent Labs Lab 10/21/12 0854 10/21/12 1305 10/21/12 1743 10/22/12 0525 10/25/12 0500  NA 139 141  --  138 137  K 4.5 5.1  --  4.8 3.6  CL 109  --   --  110 105  CO2 18*  --   --  18* 23  BUN 36*  --   --  34* 20  CREATININE 2.09*  --  1.87* 2.08* 1.97*  GLUCOSE 134* 145*  --  139* 92  CALCIUM 8.9  --   --  8.3* 8.6    Recent Labs Lab 10/21/12 0854 10/21/12 1305 10/21/12 1743 10/22/12 0525  WBC 9.5  --  9.4 8.2  HGB 8.4* 9.5* 11.5* 9.8*  HCT 25.4* 28.0* 34.1* 29.4*  PLT 189  --  190 173    Recent Labs Lab 10/21/12 0854  INR 1.53*    I/O last 3 completed shifts: In: 1080 [P.O.:1080] Out: 800  [Urine:800]  Imaging: No results found.  Assessment/Plan:  POD #5   LOS: 5 days  s/p Procedure(s): EXPLORATION AND DEBRIDEMENT OF LEFT GROIN WOUND PATCH ANGIOPLASTY  Will continue in-hospital daily wound inspection and debridement when necessary and increase dressing changes to q. 8 hours and continue vancomycin for MRSA  Discussed situation with patient and his son and the fact that we'll need to keep in hospital for close observation of this wound. When it stabilizes we'll then convert to outpatient care but this will probably another 5-7 days.   [name], MD 10/26/2012 10:26 AM

## 2012-10-27 ENCOUNTER — Ambulatory Visit: Payer: Medicare Other | Admitting: Vascular Surgery

## 2012-10-27 LAB — GLUCOSE, CAPILLARY: Glucose-Capillary: 133 mg/dL — ABNORMAL HIGH (ref 70–99)

## 2012-10-27 NOTE — Progress Notes (Signed)
Dressing change completed per MD order, pt tolerated activity well. Pt also states that he does not want his morning dressing change done being that Dr. Hart Rochester is coming to debride the wound in the morning. Will continue to monitor   [name] M

## 2012-10-27 NOTE — Progress Notes (Addendum)
[facility] [place]  Progress Note Bypass Surgery  Date of Surgery: 10/21/2012 Surgeon: Surgeon(s): Pryor Ochoa, MD  6 Days Post-Op  History of Present Illness  [name] is a 78 y.o. male who is  6 Days Post-Op. The patient's pre-op symptoms of bleeding are gone . Patients pain is well controlled.   Pt is 6 days on IV vancomycin for MRSA from wound.  Significant Diagnostic Studies: CBC Lab Results  Component Value Date   WBC 8.2 10/22/2012   HGB 9.8* 10/22/2012   HCT 29.4* 10/22/2012   MCV 81.9 10/22/2012   PLT 173 10/22/2012    BMET    Component Value Date/Time   NA 137 10/25/2012 0500   K 3.6 10/25/2012 0500   CL 105 10/25/2012 0500   CO2 23 10/25/2012 0500   GLUCOSE 92 10/25/2012 0500   BUN 20 10/25/2012 0500   CREATININE 1.97* 10/25/2012 0500   CREATININE 2.68* 09/07/2012 0812   CALCIUM 8.6 10/25/2012 0500   GFRNONAA 31* 10/25/2012 0500   GFRAA 36* 10/25/2012 0500    COAG Lab Results  Component Value Date   INR 1.53* 10/21/2012   INR 4.0 10/08/2012   INR 2.68* 10/06/2012   No results found for this basename: PTT    Physical Examination  BP Readings from Last 3 Encounters:  10/27/12 124/53  10/27/12 124/53  10/20/12 142/66   Temp Readings from Last 3 Encounters:  10/27/12 98.2 F (36.8 C) Oral  10/27/12 98.2 F (36.8 C) Oral  10/20/12 97.8 F (36.6 C) Oral   SpO2 Readings from Last 3 Encounters:  10/27/12 97%  10/27/12 97%  10/20/12 100%   Pulse Readings from Last 3 Encounters:  10/27/12 79  10/27/12 79  10/20/12 72    Pt is A&O x 3 left lower extremity: Incision/s is/are clean,dry.intact, and  healing without hematoma, erythema or drainage Lateral open wound debrided  Limb is warm; with good color Positive lateral graft pulse Decreased edema in the leg  Assessment/Plan: Pt. Doing well Post-op pain is controlled PT/OT for ambulation Continue wound care as ordered 6 days of vancomycin for MRSA CKD3 - Cr stable  @1 .97    ROCZNIAK,REGINA J 10/27/2012 8:11 AM   Agree with above Wound debrided Slowly improving

## 2012-10-28 LAB — GLUCOSE, CAPILLARY
Glucose-Capillary: 111 mg/dL — ABNORMAL HIGH (ref 70–99)
Glucose-Capillary: 96 mg/dL (ref 70–99)

## 2012-10-28 LAB — CREATININE, SERUM
Creatinine, Ser: 2.22 mg/dL — ABNORMAL HIGH (ref 0.50–1.35)
GFR calc non Af Amer: 27 mL/min — ABNORMAL LOW (ref >90)

## 2012-10-28 MED ORDER — VANCOMYCIN HCL IN DEXTROSE 750-5 MG/150ML-% IV SOLN
750.0000 mg | INTRAVENOUS | Status: DC
  Administered 2012-10-29 – 2012-11-17 (×20): 750 mg via INTRAVENOUS
  Filled 2012-10-28 (×22): qty 150

## 2012-10-28 NOTE — Progress Notes (Signed)
Patient ID: [name], male   DOB: 1935-01-03, 78 y.o.   MRN: [id number] Vascular Surgery Progress Note  Subjective: Left inguinal wound continues to slowly improve. Dressing changes 3 times a day.  Objective:  Filed Vitals:   10/28/12 0933  BP: 119/66  Pulse: 70  Temp:   Resp:     When examined today. Debridement of necrotic tissue continues. Graft pulse is palpable in the depths of wound particularly inferior aspect. Deep sutures remain intact. 3+ pulse and femoral anterior tibial bypass with decreased edema left lower leg   Labs:  Recent Labs Lab 10/22/12 0525 10/25/12 0500 10/28/12 0500  CREATININE 2.08* 1.97* 2.22*    Recent Labs Lab 10/21/12 1305  10/22/12 0525 10/25/12 0500 10/28/12 0500  NA 141  --  138 137  --   K 5.1  --  4.8 3.6  --   CL  --   --  110 105  --   CO2  --   --  18* 23  --   BUN  --   --  34* 20  --   CREATININE  --   < > 2.08* 1.97* 2.22*  GLUCOSE 145*  --  139* 92  --   CALCIUM  --   --  8.3* 8.6  --   < > = values in this interval not displayed.  Recent Labs Lab 10/21/12 1305 10/21/12 1743 10/22/12 0525  WBC  --  9.4 8.2  HGB 9.5* 11.5* 9.8*  HCT 28.0* 34.1* 29.4*  PLT  --  190 173   No results found for this basename: INR,  in the last 168 hours  I/O last 3 completed shifts: In: 720 [P.O.:720] Out: 500 [Urine:500]  Imaging: No results found.  Assessment/Plan:  POD #7   LOS: 7 days  s/p Procedure(s): EXPLORATION AND DEBRIDEMENT OF LEFT GROIN WOUND PATCH ANGIOPLASTY  Left groin wound continues to slowly improve but still concern regarding vein graft and depths of wound. Continue on vancomycin for MRSA. Graft remains patent. Continue to pack with moist saline gauze every 8 hours and daily when inspection and/or debridement   [name], MD 10/28/2012 1:03 PM

## 2012-10-28 NOTE — Progress Notes (Signed)
PHARMACY PROGRESS NOTE  Pharmacy Consult for :  Vancomycin  Indication:  MRSA groin infection  Weight: 84 kg  Vitals: BP 119/66  Pulse 70  Temp(Src) 98 F (36.7 C) (Oral)  Resp 16  Ht 5\' 8"  (1.727 m)  Wt 186 lb 1.1 oz (84.4 kg)  BMI 28.3 kg/m2  SpO2 98%  Labs:  Recent Labs  10/28/12 0500  CREATININE 2.22*   Estimated Creatinine Clearance: 29.9 ml/min (by C-G formula based on Cr of 2.22).   Microbiology: Recent Results (from the past 720 hour(s))  CULTURE, BLOOD (SINGLE)     Status: None   Collection Time    10/03/12 10:30 PM      Result Value Range Status   Specimen Description BLOOD RIGHT WRIST   Final   Special Requests BOTTLES DRAWN AEROBIC ONLY 4CC   Final   Culture  Setup Time     Final   Value: 10/04/2012 15:05     Performed at [facility]   Culture     Final   Value: NO GROWTH 5 DAYS     Performed at [facility]   Report Status 10/10/2012 FINAL   Final  ANAEROBIC CULTURE     Status: None   Collection Time    10/21/12 11:59 AM      Result Value Range Status   Specimen Description ABSCESS GROIN LEFT   Final   Special Requests PT ON ZINACEF   Final   Gram Stain     Final   Value: MODERATE WBC PRESENT, PREDOMINANTLY PMN     NO SQUAMOUS EPITHELIAL CELLS SEEN     FEW GRAM POSITIVE COCCI     IN PAIRS RARE GRAM NEGATIVE RODS     Performed at [facility]   Culture     Final   Value: NO ANAEROBES ISOLATED     Performed at [facility]   Report Status 10/26/2012 FINAL   Final  CULTURE, ROUTINE-ABSCESS     Status: None   Collection Time    10/21/12 11:59 AM      Result Value Range Status   Specimen Description ABSCESS GROIN LEFT   Final   Special Requests PT ON ZINACEF   Final   Gram Stain     Final   Value: MODERATE WBC PRESENT, PREDOMINANTLY PMN     NO SQUAMOUS EPITHELIAL CELLS SEEN     FEW GRAM POSITIVE COCCI     IN PAIRS RARE GRAM NEGATIVE RODS     Performed at [facility]   Culture      Final   Value: MODERATE METHICILLIN RESISTANT STAPHYLOCOCCUS AUREUS     Note: RIFAMPIN AND GENTAMICIN SHOULD NOT BE USED AS SINGLE DRUGS FOR TREATMENT OF STAPH INFECTIONS. CRITICAL RESULT CALLED TO, READ BACK BY AND VERIFIED WITH: JAMIE YATES @ 11:19AM  10/24/12 BY DWEEKS     Performed at [facility]   Report Status 10/24/2012 FINAL   Final   Organism ID, Bacteria METHICILLIN RESISTANT STAPHYLOCOCCUS AUREUS   Final  CLOSTRIDIUM DIFFICILE BY PCR     Status: None   Collection Time    10/23/12  1:28 PM      Result Value Range Status   C difficile by pcr NEGATIVE  NEGATIVE Final    Anti-infectives Anti-infectives   Start     Dose/Rate Route Frequency Ordered Stop   10/25/12 1800  vancomycin (VANCOCIN) IVPB 1000 mg/200 mL premix     1,000 mg 200 mL/hr  over 60 Minutes Intravenous Every 24 hours 10/24/12 1924     10/21/12 1800  vancomycin (VANCOCIN) 1,250 mg in sodium chloride 0.9 % 250 mL IVPB  Status:  Discontinued     1,250 mg 166.7 mL/hr over 90 Minutes Intravenous Every 24 hours 10/21/12 1647 10/24/12 1924   10/21/12 1800  piperacillin-tazobactam (ZOSYN) IVPB 3.375 g  Status:  Discontinued     3.375 g 12.5 mL/hr over 240 Minutes Intravenous Every 8 hours 10/21/12 1647 10/21/12 1742   10/21/12 1800  piperacillin-tazobactam (ZOSYN) IVPB 3.375 g  Status:  Discontinued     3.375 g 12.5 mL/hr over 240 Minutes Intravenous Every 8 hours 10/21/12 1742 10/24/12 1131   10/21/12 0827  cefUROXime (ZINACEF) 1.5 g in dextrose 5 % 50 mL IVPB     1.5 g 100 mL/hr over 30 Minutes Intravenous 30 min pre-op 10/21/12 0827 10/21/12 1150      Assessment:  Day # 8 Vancomycin for MRSA groin infection.  Afebrile, Last WBC 8.2.  Scr 2.22 [ ~ baseline ].  UOP 0.1 - 0.2 ml/kg/hr.  Goal of Therapy:   Vancomycin trough level 15-20 mcg/ml   Plan:   Continue Vancomycin as ordered, 1 gm IV q 24 hours.  Will check Vancomycin trough prior to today's dose and adjust as indicated.  Laurena Bering, Pharm.D.  10/28/2012 12:34 PM

## 2012-10-28 NOTE — Progress Notes (Signed)
ANTIBIOTIC CONSULT NOTE - FOLLOW UP  Pharmacy Consult:  Vancomycin Indication:  MRSA infected left groin wound  Allergies  Allergen Reactions  . Other Other (See Comments)    Plastic tape tears skin    Patient Measurements: Height: 5\' 8"  (172.7 cm) Weight: 186 lb 1.1 oz (84.4 kg) IBW/kg (Calculated) : 68.4  Vital Signs: Temp: 98.3 F (36.8 C) (08/27 1401) Temp src: Oral (08/27 1401) BP: 141/79 mmHg (08/27 1728) Pulse Rate: 69 (08/27 1728) Intake/Output from previous day: 08/26 0701 - 08/27 0700 In: 720 [P.O.:720] Out: 500 [Urine:500]  Labs:  Recent Labs  10/28/12 0500  CREATININE 2.22*   Estimated Creatinine Clearance: 29.9 ml/min (by C-G formula based on Cr of 2.22).  Recent Labs  10/28/12 1731  VANCOTROUGH 19.8     Microbiology: Recent Results (from the past 720 hour(s))  CULTURE, BLOOD (SINGLE)     Status: None   Collection Time    10/03/12 10:30 PM      Result Value Range Status   Specimen Description BLOOD RIGHT WRIST   Final   Special Requests BOTTLES DRAWN AEROBIC ONLY 4CC   Final   Culture  Setup Time     Final   Value: 10/04/2012 15:05     Performed at [facility]   Culture     Final   Value: NO GROWTH 5 DAYS     Performed at [facility]   Report Status 10/10/2012 FINAL   Final  ANAEROBIC CULTURE     Status: None   Collection Time    10/21/12 11:59 AM      Result Value Range Status   Specimen Description ABSCESS GROIN LEFT   Final   Special Requests PT ON ZINACEF   Final   Gram Stain     Final   Value: MODERATE WBC PRESENT, PREDOMINANTLY PMN     NO SQUAMOUS EPITHELIAL CELLS SEEN     FEW GRAM POSITIVE COCCI     IN PAIRS RARE GRAM NEGATIVE RODS     Performed at [facility]   Culture     Final   Value: NO ANAEROBES ISOLATED     Performed at [facility]   Report Status 10/26/2012 FINAL   Final  CULTURE, ROUTINE-ABSCESS     Status: None   Collection Time    10/21/12 11:59 AM      Result Value Range  Status   Specimen Description ABSCESS GROIN LEFT   Final   Special Requests PT ON ZINACEF   Final   Gram Stain     Final   Value: MODERATE WBC PRESENT, PREDOMINANTLY PMN     NO SQUAMOUS EPITHELIAL CELLS SEEN     FEW GRAM POSITIVE COCCI     IN PAIRS RARE GRAM NEGATIVE RODS     Performed at [facility]   Culture     Final   Value: MODERATE METHICILLIN RESISTANT STAPHYLOCOCCUS AUREUS     Note: RIFAMPIN AND GENTAMICIN SHOULD NOT BE USED AS SINGLE DRUGS FOR TREATMENT OF STAPH INFECTIONS. CRITICAL RESULT CALLED TO, READ BACK BY AND VERIFIED WITH: JAMIE YATES @ 11:19AM  10/24/12 BY DWEEKS     Performed at [facility]   Report Status 10/24/2012 FINAL   Final   Organism ID, Bacteria METHICILLIN RESISTANT STAPHYLOCOCCUS AUREUS   Final  CLOSTRIDIUM DIFFICILE BY PCR     Status: None   Collection Time    10/23/12  1:28 PM      Result  Value Range Status   C difficile by pcr NEGATIVE  NEGATIVE Final      Assessment: [name] on vancomycin for MRSA infection of left groin abscess.  Patient's renal function is worsening again and his urine output remains low.  His vancomycin trough is therapeutic, but is toward the high end of goal.    Goal of Therapy:  Vancomycin trough level 15-20 mcg/ml   Plan:  - Decrease vanc to 750mg  IV Q24H, start tomorrow - Monitor renal fxn, PRN vanc trough     Patsie Mccardle D. Laney Potash, PharmD, BCPS Pager:  [phone] 10/28/2012, 7:39 PM

## 2012-10-29 LAB — GLUCOSE, CAPILLARY
Glucose-Capillary: 103 mg/dL — ABNORMAL HIGH (ref 70–99)
Glucose-Capillary: 116 mg/dL — ABNORMAL HIGH (ref 70–99)

## 2012-10-29 MED ORDER — SODIUM CHLORIDE 0.9 % IR SOLN: 10.0000 mL | Freq: Three times a day (TID) | Status: DC

## 2012-10-29 MED ORDER — SODIUM CHLORIDE 0.9 % IR SOLN
10.0000 mL | Freq: Three times a day (TID) | Status: DC
  Administered 2012-10-29: 10 mL

## 2012-10-29 NOTE — Progress Notes (Signed)
Pt tx [address] [facility], pt oriented to room, wife at [facility]

## 2012-10-29 NOTE — Progress Notes (Addendum)
VVS Note:  Pt to be transferred to 3300 for closer observation of wound per Dr. Hart Rochester.  When examined today and is slowly improving. The vein graft is palpable in the inferior aspect of the open wound with some dark material overlying it. Unable to debride in this area because proximity to vein graft. Other aspect of wound is slowly cleaning up.  Plan continue 3 times a day dressing changes with moist saline gauze and daily debridement as indicated but not in proximity of vein graft  A transferred patient to 3300 so that we can have more close observation in case bleeding should occur. If vein graft blows out he will require ligation and eventually and dictation no further attempts at revascularization  Patient is well aware of this and in agreement with plan-Dr. Durwin Nora to followup over long weekend

## 2012-10-29 NOTE — Progress Notes (Signed)
L groin dsg changed. Slough present in wound bed with some purulent drainage. Red unapproximated edges. Wet to dry gauze packed in wound and covered with Foam dsg. Will continue to monitor pt closely.

## 2012-10-30 ENCOUNTER — Encounter: Payer: Self-pay | Admitting: Vascular Surgery

## 2012-10-30 LAB — GLUCOSE, CAPILLARY
Glucose-Capillary: 115 mg/dL — ABNORMAL HIGH (ref 70–99)
Glucose-Capillary: 150 mg/dL — ABNORMAL HIGH (ref 70–99)
Glucose-Capillary: 114 mg/dL — ABNORMAL HIGH (ref 70–99)
Glucose-Capillary: 168 mg/dL — ABNORMAL HIGH (ref 70–99)

## 2012-10-30 MED ORDER — ENOXAPARIN SODIUM 30 MG/0.3ML ~~LOC~~ SOLN
30.0000 mg | SUBCUTANEOUS | Status: DC
  Administered 2012-10-31 – 2012-11-04 (×5): 30 mg via SUBCUTANEOUS
  Filled 2012-10-30 (×7): qty 0.3

## 2012-10-30 MED ORDER — ALLOPURINOL 100 MG PO TABS
200.0000 mg | ORAL_TABLET | Freq: Every day | ORAL | Status: DC
  Administered 2012-10-30 – 2012-11-18 (×20): 200 mg via ORAL
  Filled 2012-10-30 (×21): qty 2

## 2012-10-30 MED ORDER — COLCHICINE 0.6 MG PO TABS
0.6000 mg | ORAL_TABLET | Freq: Two times a day (BID) | ORAL | Status: DC
  Administered 2012-10-30 – 2012-11-18 (×38): 0.6 mg via ORAL
  Filled 2012-10-30 (×40): qty 1

## 2012-10-30 NOTE — Progress Notes (Signed)
PT Cancellation Note  Patient Details Name: [name] MRN: [id number] DOB: [birth year]   Cancelled Treatment:     PT/OT evaluated and discharged pt on 8/22 due to pt being at his baseline level of mobility.  If something has changed in his status (I did not find any new surgeries or complications) then we can re-assess the pt, otherwise we ask that RN staff ambulate with the pt please.  Please see notes on 8/22 for details.  PT to sign off.  Thanks,  Rollene Rotunda. Keisy Strickler, PT, DPT [phone]   10/30/2012, 5:13 PM

## 2012-10-30 NOTE — Progress Notes (Addendum)
[facility] [place]  Progress Note Bypass Surgery  Date of Surgery: 10/21/2012  Procedure(s): EXPLORATION AND DEBRIDEMENT OF LEFT GROIN WOUND PATCH ANGIOPLASTY Surgeon: Surgeon(s): Pryor Ochoa, MD  9 Days Post-Op  History of Present Illness  [name] is a 78 y.o. male who is  9 Days Post-Op. The patient is having an acute flair of gout in his hands. States he takes allopurinol at home when needed.  Left leg unchanged   Physical Examination  BP Readings from Last 3 Encounters:  10/30/12 143/62  10/30/12 143/62  10/20/12 142/66   Temp Readings from Last 3 Encounters:  10/30/12 99.1 F (37.3 C) Oral  10/30/12 99.1 F (37.3 C) Oral  10/20/12 97.8 F (36.6 C) Oral   SpO2 Readings from Last 3 Encounters:  10/30/12 100%  10/30/12 100%  10/20/12 100%   Pulse Readings from Last 3 Encounters:  10/30/12 73  10/30/12 73  10/20/12 72   Pt is A&O x 3 left lower extremity: Incision/s is/are stable. No bleeding LLE  is warm; with good color  Left hand 3rd finger PIP red swollen and tender  Assessment/Plan: Pt. Doing well Post-op pain is controlled Wounds are healing well PT/OT for ambulation Continue wound care as ordered - Q8 hour dressing changes  MRSA in wound - Day 8 of vancomycin Acute flair of gout left hand - will start colchicine/allopurinol Colchicine 0.6 BID/allopurinol 200mg  daily per pharmacy Will recheck labs in am   ROCZNIAK,REGINA J  10/30/2012 7:57 AM  Agree with above. Wound granulating nicely on the sides. The base of the wound look marginal. Continue dressing changes and Vanco. Will need PTx to mobilize him.   [place] Ferrari, MD, FACS Beeper [phone] 10/30/2012

## 2012-10-31 LAB — GLUCOSE, CAPILLARY: Glucose-Capillary: 91 mg/dL (ref 70–99)

## 2012-10-31 LAB — BASIC METABOLIC PANEL
BUN: 36 mg/dL — ABNORMAL HIGH (ref 6–23)
Creatinine, Ser: 2.13 mg/dL — ABNORMAL HIGH (ref 0.50–1.35)
GFR calc Af Amer: 33 mL/min — ABNORMAL LOW (ref >90)
GFR calc non Af Amer: 28 mL/min — ABNORMAL LOW (ref >90)

## 2012-10-31 LAB — CBC
MCHC: 32.9 g/dL (ref 30.0–36.0)
Platelets: 167 10*3/uL (ref 150–400)
RDW: 15.7 % — ABNORMAL HIGH (ref 11.5–15.5)

## 2012-10-31 NOTE — Progress Notes (Addendum)
ANTIBIOTIC CONSULT NOTE - FOLLOW UP  Pharmacy Consult:  Vancomycin Indication:  MRSA infected left groin wound  Allergies  Allergen Reactions  . Other Other (See Comments)    Plastic tape tears skin    Patient Measurements: Height: 5\' 8"  (172.7 cm) Weight: 186 lb 1.1 oz (84.4 kg) IBW/kg (Calculated) : 68.4  Vital Signs: Temp: 98.1 F (36.7 C) (08/30 0833) Temp src: Oral (08/30 0833) BP: 108/65 mmHg (08/30 0833) Pulse Rate: 69 (08/30 0833) Intake/Output from previous day: 08/29 0701 - 08/30 0700 In: -  Out: 1300 [Urine:1300]  Labs:  Recent Labs  10/31/12 0415  WBC 6.9  HGB 10.4*  PLT 167  CREATININE 2.13*   Estimated Creatinine Clearance: 31.2 ml/min (by C-G formula based on Cr of 2.13).  Recent Labs  10/28/12 1731  VANCOTROUGH 19.8     Microbiology: Recent Results (from the past 720 hour(s))  CULTURE, BLOOD (SINGLE)     Status: None   Collection Time    10/03/12 10:30 PM      Result Value Range Status   Specimen Description BLOOD RIGHT WRIST   Final   Special Requests BOTTLES DRAWN AEROBIC ONLY 4CC   Final   Culture  Setup Time     Final   Value: 10/04/2012 15:05     Performed at [facility]   Culture     Final   Value: NO GROWTH 5 DAYS     Performed at [facility]   Report Status 10/10/2012 FINAL   Final  ANAEROBIC CULTURE     Status: None   Collection Time    10/21/12 11:59 AM      Result Value Range Status   Specimen Description ABSCESS GROIN LEFT   Final   Special Requests PT ON ZINACEF   Final   Gram Stain     Final   Value: MODERATE WBC PRESENT, PREDOMINANTLY PMN     NO SQUAMOUS EPITHELIAL CELLS SEEN     FEW GRAM POSITIVE COCCI     IN PAIRS RARE GRAM NEGATIVE RODS     Performed at [facility]   Culture     Final   Value: NO ANAEROBES ISOLATED     Performed at [facility]   Report Status 10/26/2012 FINAL   Final  CULTURE, ROUTINE-ABSCESS     Status: None   Collection Time    10/21/12 11:59 AM   Result Value Range Status   Specimen Description ABSCESS GROIN LEFT   Final   Special Requests PT ON ZINACEF   Final   Gram Stain     Final   Value: MODERATE WBC PRESENT, PREDOMINANTLY PMN     NO SQUAMOUS EPITHELIAL CELLS SEEN     FEW GRAM POSITIVE COCCI     IN PAIRS RARE GRAM NEGATIVE RODS     Performed at [facility]   Culture     Final   Value: MODERATE METHICILLIN RESISTANT STAPHYLOCOCCUS AUREUS     Note: RIFAMPIN AND GENTAMICIN SHOULD NOT BE USED AS SINGLE DRUGS FOR TREATMENT OF STAPH INFECTIONS. CRITICAL RESULT CALLED TO, READ BACK BY AND VERIFIED WITH: JAMIE YATES @ 11:19AM  10/24/12 BY DWEEKS     Performed at [facility]   Report Status 10/24/2012 FINAL   Final   Organism ID, Bacteria METHICILLIN RESISTANT STAPHYLOCOCCUS AUREUS   Final  CLOSTRIDIUM DIFFICILE BY PCR     Status: None   Collection Time    10/23/12  1:28 PM  Result Value Range Status   C difficile by pcr NEGATIVE  NEGATIVE Final    Assessment: [name] on vancomycin for MRSA infection of left groin abscess.  Patient's renal function slightly improved today, but was worsening with low urine output.  His vancomycin trough (on 8/27) was therapeutic, but was toward the high end of goal. The Vancomycin dose was therefore adjusted that day.   Goal of Therapy:  Vancomycin trough level 15-20 mcg/ml  Plan:  - Continue Vancomycin 750mg  IV Q24h - Monitor renal fxn -Consider VT tomorrow (8/31) before fourth dose around 1900  Anabel Bene, PharmD Clinical Pharmacist-Resident Pager: [phone]  Addendum: Vancomycin trough = 18.3, at goal, drawn at appropriate time.   Plan: Continue vancomycin at current dose Monitor renal function

## 2012-11-01 LAB — VANCOMYCIN, TROUGH: Vancomycin Tr: 18.3 ug/mL (ref 10.0–20.0)

## 2012-11-01 LAB — GLUCOSE, CAPILLARY: Glucose-Capillary: 167 mg/dL — ABNORMAL HIGH (ref 70–99)

## 2012-11-01 NOTE — Progress Notes (Signed)
VASCULAR PROGRESS NOTE  SUBJECTIVE: No complaints. Has been ambulating.   PHYSICAL EXAM: Filed Vitals:   10/31/12 1600 10/31/12 1957 10/31/12 2333 11/01/12 0354  BP: 107/49 116/47 108/54 122/53  Pulse: 70 71 70 74  Temp: 98.5 F (36.9 C) 98.3 F (36.8 C) 98.4 F (36.9 C) 97.6 F (36.4 C)  TempSrc: Oral Oral Oral Oral  Resp: 23 20 23 19   Height:      Weight:      SpO2: 99% 99% 97% 99%   Wound looks good. Some necrotic tissue at base.  Palpable graft pulse.   LABS: Lab Results  Component Value Date   WBC 6.9 10/31/2012   HGB 10.4* 10/31/2012   HCT 31.6* 10/31/2012   MCV 82.7 10/31/2012   PLT 167 10/31/2012   Lab Results  Component Value Date   CREATININE 2.13* 10/31/2012   Lab Results  Component Value Date   INR 1.53* 10/21/2012   CBG (last 3)   Recent Labs  10/31/12 1213 10/31/12 1640 10/31/12 2145  GLUCAP 130* 150* 91   ASSESSMENT AND PLAN:  * Continue dressing changes. Wound gradually cleaning up. * graft patent. * continue IV Sharlette Dense Beeper: [phone] 11/01/2012

## 2012-11-01 NOTE — Progress Notes (Signed)
Pt IV due to be removed 10/31/12 at midnight, MD extended removal. Will continue to monitor

## 2012-11-02 LAB — GLUCOSE, CAPILLARY
Glucose-Capillary: 164 mg/dL — ABNORMAL HIGH (ref 70–99)
Glucose-Capillary: 171 mg/dL — ABNORMAL HIGH (ref 70–99)
Glucose-Capillary: 115 mg/dL — ABNORMAL HIGH (ref 70–99)

## 2012-11-02 NOTE — Progress Notes (Signed)
VASCULAR PROGRESS NOTE  SUBJECTIVE: No complaints.   PHYSICAL EXAM: Filed Vitals:   11/01/12 2001 11/01/12 2312 11/02/12 0328 11/02/12 0700  BP: 120/55 126/55 148/69 130/57  Pulse: 71 70 72 70  Temp: 98.2 F (36.8 C) 98.3 F (36.8 C) 98.4 F (36.9 C) 97.8 F (36.6 C)  TempSrc: Oral Oral Oral Oral  Resp: 25 11 26 23   Height:      Weight:      SpO2: 98% 98% 100% 98%   Wound clean on the sides with good granulation tissue. Some necrotic fibrous tissue at the base. Some excisional debridement done. Palpable graft pulse.   LABS: Lab Results  Component Value Date   CREATININE 2.13* 10/31/2012   CBG (last 3)   Recent Labs  11/01/12 1731 11/01/12 2203 11/02/12 0816  GLUCAP 196* 121* 114*   ASSESSMENT AND PLAN:  * Wound improving with meticulous dressing changes.  * Graft patent  * Cont IV Sharlette Dense Beeper: [phone] 11/02/2012

## 2012-11-03 ENCOUNTER — Ambulatory Visit: Payer: Medicare Other | Admitting: Vascular Surgery

## 2012-11-03 LAB — GLUCOSE, CAPILLARY
Glucose-Capillary: 127 mg/dL — ABNORMAL HIGH (ref 70–99)
Glucose-Capillary: 147 mg/dL — ABNORMAL HIGH (ref 70–99)

## 2012-11-03 NOTE — Progress Notes (Addendum)
  VASCULAR SURGERY BYPASS PROGRESS NOTE   13 Days Post-Op  SUBJECTIVE: Pt doping well. No bleeding from left groin wound. Pt ambulating. Pain well controlled  PHYSICAL EXAM: BP Readings from Last 3 Encounters:  11/03/12 145/67  11/03/12 145/67  10/20/12 142/66   Temp Readings from Last 3 Encounters:  11/03/12 98.1 F (36.7 C) Oral  11/03/12 98.1 F (36.7 C) Oral  10/20/12 97.8 F (36.6 C) Oral   Pulse Readings from Last 3 Encounters:  11/03/12 73  11/03/12 73  10/20/12 72   SpO2 Readings from Last 3 Encounters:  11/03/12 100%  11/03/12 100%  10/20/12 100%     Intake/Output Summary (Last 24 hours) at 11/03/12 1035 Last data filed at 11/03/12 0400  Gross per 24 hour  Intake    750 ml  Output   1075 ml  Net   -325 ml    Extremities: Incisions clean, dry and intact Left groin- vein graft exposed at distal end of wound. Outer edges granulating Graft Pulse palpable  LABS: Lab Results  Component Value Date   WBC 6.9 10/31/2012   HGB 10.4* 10/31/2012   HCT 31.6* 10/31/2012   MCV 82.7 10/31/2012   PLT 167 10/31/2012   Lab Results  Component Value Date   CREATININE 2.13* 10/31/2012   Lab Results  Component Value Date   INR 1.53* 10/21/2012       ASSESSMENT: 13 Days Post-Op s/p Exploration left inguinal wound with proximal control common femoral artery, insertion patch angioplasty using bovine pericardial patch, revision of left femoral to anterior tibial bypass with reattachment of the vein to bovine patch, debridement left groin wound with primary closure of the majority of wound   Graft exposed at base of wound with granulating around it  PLAN: Ambulate Wound Management: Wet to Dry to left groin TID DVT prophylaxis: lovenox  Day 13 Vancomycin  Left groin wound examined Lateral walls are beginning to granulate Vein graft is partially visible and depth of wound but not ulcerated Bypass has 3+ pulse and edema and leg below knee has diminished  Continued  vancomycin and frequent dressing changes and wound observation and debridement daily   Discussed  situation once again with patient and his son and they both understand that there is significant risk that hemorrhage could occur but without bypass patient will require AKA-continue local wound care

## 2012-11-03 NOTE — Progress Notes (Signed)
Utilization review completed.  

## 2012-11-04 ENCOUNTER — Telehealth: Payer: Self-pay | Admitting: Vascular Surgery

## 2012-11-04 LAB — GLUCOSE, CAPILLARY

## 2012-11-04 LAB — BASIC METABOLIC PANEL
BUN: 29 mg/dL — ABNORMAL HIGH (ref 6–23)
GFR calc non Af Amer: 34 mL/min — ABNORMAL LOW (ref >90)
Glucose, Bld: 91 mg/dL (ref 70–99)
Potassium: 3.8 mEq/L (ref 3.5–5.1)

## 2012-11-04 MED ORDER — ENOXAPARIN SODIUM 40 MG/0.4ML ~~LOC~~ SOLN
40.0000 mg | SUBCUTANEOUS | Status: DC
  Administered 2012-11-04 – 2012-11-05 (×2): 40 mg via SUBCUTANEOUS
  Administered 2012-11-06: 30 mg via SUBCUTANEOUS
  Administered 2012-11-07 – 2012-11-16 (×10): 40 mg via SUBCUTANEOUS
  Filled 2012-11-04 (×15): qty 0.4

## 2012-11-04 NOTE — Telephone Encounter (Signed)
Already done

## 2012-11-04 NOTE — Progress Notes (Signed)
ANTIBIOTIC CONSULT NOTE - FOLLOW UP  Pharmacy Consult:  Vancomycin Indication:  MRSA infected left groin wound  Allergies  Allergen Reactions  . Other Other (See Comments)    Plastic tape tears skin   Labs:  Recent Labs  11/04/12 0415  CREATININE 1.85*   Estimated Creatinine Clearance: 35.9 ml/min (by C-G formula based on Cr of 1.85).  Recent Labs  11/01/12 1815  VANCOTROUGH 18.3     Microbiology: Recent Results (from the past 720 hour(s))  ANAEROBIC CULTURE     Status: None   Collection Time    10/21/12 11:59 AM      Result Value Range Status   Specimen Description ABSCESS GROIN LEFT   Final   Special Requests PT ON ZINACEF   Final   Gram Stain     Final   Value: MODERATE WBC PRESENT, PREDOMINANTLY PMN     NO SQUAMOUS EPITHELIAL CELLS SEEN     FEW GRAM POSITIVE COCCI     IN PAIRS RARE GRAM NEGATIVE RODS     Performed at [facility]   Culture     Final   Value: NO ANAEROBES ISOLATED     Performed at [facility]   Report Status 10/26/2012 FINAL   Final  CULTURE, ROUTINE-ABSCESS     Status: None   Collection Time    10/21/12 11:59 AM      Result Value Range Status   Specimen Description ABSCESS GROIN LEFT   Final   Special Requests PT ON ZINACEF   Final   Gram Stain     Final   Value: MODERATE WBC PRESENT, PREDOMINANTLY PMN     NO SQUAMOUS EPITHELIAL CELLS SEEN     FEW GRAM POSITIVE COCCI     IN PAIRS RARE GRAM NEGATIVE RODS     Performed at [facility]   Culture     Final   Value: MODERATE METHICILLIN RESISTANT STAPHYLOCOCCUS AUREUS     Note: RIFAMPIN AND GENTAMICIN SHOULD NOT BE USED AS SINGLE DRUGS FOR TREATMENT OF STAPH INFECTIONS. CRITICAL RESULT CALLED TO, READ BACK BY AND VERIFIED WITH: JAMIE YATES @ 11:19AM  10/24/12 BY DWEEKS     Performed at [facility]   Report Status 10/24/2012 FINAL   Final   Organism ID, Bacteria METHICILLIN RESISTANT STAPHYLOCOCCUS AUREUS   Final  CLOSTRIDIUM DIFFICILE BY PCR     Status: None    Collection Time    10/23/12  1:28 PM      Result Value Range Status   C difficile by pcr NEGATIVE  NEGATIVE Final    Assessment: [name] on vancomycin for MRSA infection of left groin abscess.  Day 14 of vancomycin -  Scr improving.  Goal of Therapy:  Vancomycin trough level 15-20 mcg/ml  Plan:  - Continue Vancomycin 750mg  IV Q24h - Continue to monitor renal function  Thank you. Okey Regal, PharmD [phone]

## 2012-11-04 NOTE — Progress Notes (Addendum)
  VASCULAR SURGERY BYPASS PROGRESS NOTE   14 Days Post-Op MRSA infection Day 14 Vancomycin  SUBJECTIVE: [name] is a 78 y.o. male is 2 weeks post -op insertion patch angioplasty using bovine pericardial patch, revision of left femoral to anterior tibial bypass with reattachment of the vein to bovine patch Pt restless due to being confined   PHYSICAL EXAM: BP Readings from Last 3 Encounters:  11/04/12 126/112  11/04/12 126/112  10/20/12 142/66   Temp Readings from Last 3 Encounters:  11/04/12 98 F (36.7 C) Oral  11/04/12 98 F (36.7 C) Oral  10/20/12 97.8 F (36.6 C) Oral   Pulse Readings from Last 3 Encounters:  11/04/12 70  11/04/12 70  10/20/12 72   SpO2 Readings from Last 3 Encounters:  11/04/12 95%  11/04/12 95%  10/20/12 100%     Intake/Output Summary (Last 24 hours) at 11/04/12 0850 Last data filed at 11/03/12 1921  Gross per 24 hour  Intake    630 ml  Output      0 ml  Net    630 ml    Extremities: Incisions clean, dry and intact Graft Pulse palpable Left groin wound beginning to granulate Debrided further  graft palp at distal base  LABS: Lab Results  Component Value Date   WBC 6.9 10/31/2012   HGB 10.4* 10/31/2012   HCT 31.6* 10/31/2012   MCV 82.7 10/31/2012   PLT 167 10/31/2012   Lab Results  Component Value Date   CREATININE 1.85* 11/04/2012   Lab Results  Component Value Date   INR 1.53* 10/21/2012       ASSESSMENT: 14 Days Post-Op  PLAN: Ambulate Wound Management: continue wet to dry dressing Q shift DVT prophylaxis: lovenox MRSA infection Day 14 Vancomycin  Agree with above assessment Wound exam and it is continuing to improve each day Vein graft no palpable invasive wound and partially visible We'll need to keep in 3300 so that we can watch closely for bleeding Excellent graft pulse and well perfused left foot with decreasing edema

## 2012-11-05 LAB — GLUCOSE, CAPILLARY
Glucose-Capillary: 99 mg/dL (ref 70–99)
Glucose-Capillary: 123 mg/dL — ABNORMAL HIGH (ref 70–99)

## 2012-11-05 NOTE — Consult Note (Signed)
78 yo male with history left LE rest pain that has undergone revascularization with resolution rest pain and presently pt independent in ambulation. Course has been complicated by multiple episodes of thrombosis requiring revision of bypass grafting, angioplasty, placement and subsequent removal Gortex bypass, and hematoma with MRSA cultured from wound. Last intervention 09/30/12 with revision fem-AT bypass with composite cephalic and basilic vein graft.  Plastic surgery consulted for coverage options as pt has open wound over groin with exposure vein bypass. Current wound care wet to dry; being maintained in hospital due to exposure vein bypass and risk of rebleeding.  Past medical history:I have reviewed and confirmed the past medical history in the chart. Medications: reviewed medication list in the chart Allergies: reviewed allergy section in the chart Review of Systems: Negative for chest pain and shortness of breath, negative for claudication, + neuropathy  Past Surgical History  Procedure Laterality Date  . Turp vaporization    . Cardiac defibrillator placement  12/26/09    pacemaker combo  . Cervical epidural injection  2013  . Femoral-tibial bypass graft  09/25/2011    Procedure: BYPASS GRAFT FEMORAL-TIBIAL ARTERY;  Surgeon: Pryor Ochoa, MD;  Location: [facility] OR;  Service: Vascular;  Laterality: Left;  Left Femoral - Anterior Tibial Bypass;  saphenous vein graft from left leg  . Intraoperative arteriogram  09/25/2011    Procedure: INTRA OPERATIVE ARTERIOGRAM;  Surgeon: Pryor Ochoa, MD;  Location: [facility] OR;  Service: Vascular;  Laterality: Left;  . Femoral-tibial bypass graft  02/07/2012    Procedure: BYPASS GRAFT FEMORAL-TIBIAL ARTERY;  Surgeon: Larina Earthly, MD;  Location: [facility] OR;  Service: Vascular;  Laterality: Left;  Thrombectomy Left Femoral - Tibial Bypass Graft  . Embolectomy  02/07/2012    Procedure: EMBOLECTOMY;  Surgeon: Larina Earthly, MD;  Location: [facility] OR;  Service: Vascular;   Laterality: Left;  . Femoral-tibial bypass graft  04/03/2012    Procedure: BYPASS GRAFT FEMORAL-TIBIAL ARTERY;  Surgeon: Pryor Ochoa, MD;  Location: [facility] OR;  Service: Vascular;  Laterality: Left;  Redo  . Insert / replace / remove pacemaker  2007  . Coronary angioplasty with stent placement  ~ 2000  . Coronary angioplasty    . Uvulopalatopharyngoplasty (uppp)/tonsillectomy/septoplasty  06/30/2003    [name] 06/30/2003 (07/10/2012)  . Shoulder open rotator cuff repair Right 2001    repair of lacerated right/notes 10/11/1999  (07/10/2012)  . Foot surgery Right 03/20/2001    "have plates and screws in; didn't break it" (07/10/2012)  . Carpal tunnel release Right 2002    [name] 03/20/2001 (07/10/2012)  . Biceps tendon repair Right 2001    [name] 03/20/2001 (07/10/2012)  . Renal artery stent  2009  . Femoral-popliteal bypass graft Left 09/16/2012    Procedure: LEFT FEMORAL-POPLITEAL BYPASS GRAFT WITH GORTEX Propaten Graft 6x80 Thin Wall and Left lower leg Angiogram;  Surgeon: Pryor Ochoa, MD;  Location: [facility] OR;  Service: Vascular;  Laterality: Left;  . Colonoscopy      Hx; of  . Tonsillectomy    . Adenoidectomy      Hx; of   . Femoral-tibial bypass graft Left 09/30/2012    Procedure: REDO LEFT FEMORAL-ANTERIOR TIBIAL ARTERY BYPASS USING COMPOSITE CEPHALIC AND BASILIC VEIN GRAFT FROM LEFT ARM;  Surgeon: Pryor Ochoa, MD;  Location: [facility] OR;  Service: Vascular;  Laterality: Left;  . I&d extremity Left 10/21/2012    Procedure: EXPLORATION AND DEBRIDEMENT OF LEFT GROIN WOUND;  Surgeon: Pryor Ochoa, MD;  Location: [facility] OR;  Service: Vascular;  Laterality: Left;  . Patch angioplasty Left 10/21/2012    Procedure: PATCH ANGIOPLASTY;  Surgeon: Pryor Ochoa, MD;  Location: [facility] OR;  Service: Vascular;  Laterality: Left;    History   Social History  . Marital Status: Widowed    Spouse Name: N/A    Number of Children: N/A  . Years of Education: N/A   Occupational History  . Retired    Social History Main  Topics  . Smoking status: Never Smoker   . Smokeless tobacco: Never Used     Comment: 1-2 cigars when golfing  . Alcohol Use: 2.4 oz/week    2 Glasses of wine, 2 Shots of liquor per week     Comment: 07/10/2012 "galss of wine or vodka tonic 3 X/wk"  . Drug Use: No  . Sexual Activity: Not Currently   Other Topics Concern  . Not on file   Social History Narrative   Lives in [place] with significant other,  [name].    PE  Alert oriented Abdomen soft, no hernia, no scars LUE with normal grip strength, healed incision BLE warm, non pitting edema LLE, all vein harvest sites healed. Palpable pulse over lateral knee of bypass Open wound groin appr 6 x 9 cm with slough side walls, some granulation, no gross infection Visible vein of bypass graft in wound base   A/P Critical ischemia of LLE post revascularization, now with open wound, hx MRSA, and exposure venous graft.  Reviewed last angiogram with Dr. Hart Rochester. Given occlusion SFA and vascular history, sartorius and local thigh flaps are not options. Discussed contralateral rectus abdominus flap with skin paddle with intention of coverage vascular bypass, reducing bleeding risk and helping clear infection by coverage wound. Discussed with both patient and son over phone that this would be significant surgery, anticipate 5-7 day post surgery in hospital, risks of wound healing problems of both abdomen and groin, pain and diminished activities at least in short term with muscle harvest. Drains, additional help when he returned home for ADLs reviewed.  And as noted by Dr. Hart Rochester, if he has another bleeding episode, bypass may need to be ligated and risk limb loss.  Discussed smaller procedures such as wound debridement and placement ACell matrix to stimulate secondary wound healing; this would require debridement groin and would not necessarily reduce bleeding risk.  Pt and son to discuss options and will let Dr. Hart Rochester know their  decisions. I will return to answer any additional questions as needed.

## 2012-11-05 NOTE — Progress Notes (Addendum)
  VASCULAR SURGERY BYPASS PROGRESS NOTE   15 Days Post-Op/ 15 days Vancomycin  SUBJECTIVE: [name] is a 78 y.o. male doing well/ No C/O Left groin for debridement today.   PHYSICAL EXAM: BP Readings from Last 3 Encounters:  11/05/12 143/59  11/05/12 143/59  10/20/12 142/66   Temp Readings from Last 3 Encounters:  11/05/12 98.1 F (36.7 C) Oral  11/05/12 98.1 F (36.7 C) Oral  10/20/12 97.8 F (36.6 C) Oral   Pulse Readings from Last 3 Encounters:  11/05/12 72  11/05/12 72  10/20/12 72   SpO2 Readings from Last 3 Encounters:  11/05/12 98%  11/05/12 98%  10/20/12 100%     Intake/Output Summary (Last 24 hours) at 11/05/12 0916 Last data filed at 11/05/12 0800  Gross per 24 hour  Intake   1110 ml  Output      0 ml  Net   1110 ml    Extremities: Incisions open cleaning up Still has sig necrotic fat at base Vein graft exposed at distal pole of wound Graft Pulse palp  LABS: Lab Results  Component Value Date   WBC 6.9 10/31/2012   HGB 10.4* 10/31/2012   HCT 31.6* 10/31/2012   MCV 82.7 10/31/2012   PLT 167 10/31/2012   Lab Results  Component Value Date   CREATININE 1.85* 11/04/2012   Lab Results  Component Value Date   INR 1.53* 10/21/2012       ASSESSMENT: 15 Days Post-Op and vancomycin Exposed vein graft at base of wound with potential for disruption and bleeding  PLAN: Continue to observe in Stepdown Ambulate Wound Management: TID wet to dry Vancomycin fpr MRSA Post-op infection - will need 4-6 weeks total   Agree with above assessment Wound examined and debrided slightly Vein graft remains exposed and very distal aspect of left inguinal wound with excellent pulse. Bypass continues to function nicely with left foot well-perfused  Examined the wound with the plastic surgical service to consider a rectus muscle flap to cover this Discussed this with patient and plastic surgeons will return later to further discuss this as a potential  option No decision about this has been made

## 2012-11-06 LAB — GLUCOSE, CAPILLARY

## 2012-11-06 LAB — BASIC METABOLIC PANEL
Chloride: 104 mEq/L (ref 96–112)
GFR calc Af Amer: 39 mL/min — ABNORMAL LOW (ref >90)
Potassium: 3.7 mEq/L (ref 3.5–5.1)

## 2012-11-06 LAB — MAGNESIUM: Magnesium: 2 mg/dL (ref 1.5–2.5)

## 2012-11-06 MED ORDER — ADULT MULTIVITAMIN W/MINERALS CH
1.0000 | ORAL_TABLET | Freq: Every day | ORAL | Status: DC
  Administered 2012-11-06 – 2012-11-18 (×13): 1 via ORAL
  Filled 2012-11-06 (×13): qty 1

## 2012-11-06 MED ORDER — GLUCERNA SHAKE PO LIQD
237.0000 mL | Freq: Two times a day (BID) | ORAL | Status: DC
  Administered 2012-11-06: 237 mL via ORAL

## 2012-11-06 MED ORDER — ENSURE COMPLETE PO LIQD
237.0000 mL | Freq: Two times a day (BID) | ORAL | Status: DC
  Administered 2012-11-07 – 2012-11-12 (×8): 237 mL via ORAL

## 2012-11-06 NOTE — Progress Notes (Signed)
Patient ID: [name], male   DOB: 08-Oct-1934, 78 y.o.   MRN: [id number] Vascular Surgery Progress Note  Subjective: Left inguinal wound being packed every 8 hours with more saline gauze. Patient reports less discomfort with each packing. No bleeding or significant purulent drainage. Patient reports left foot continues to feel well following bypass.  Objective:  Filed Vitals:   11/06/12 0800  BP: 135/56  Pulse:   Temp:   Resp:     Left inguinal wound examined. There is some fibrinous material in the base of the wound which was partially debrided. Pain graft continues to be exposed in the very distal aspect of the wound and this is slowly cleaning up. 3+ pulse in the graft with well-perfused left foot   Labs:  Recent Labs Lab 10/31/12 0415 11/04/12 0415 11/06/12 0030  CREATININE 2.13* 1.85* 1.84*    Recent Labs Lab 10/31/12 0415 11/04/12 0415 11/06/12 0030  NA 137 141 137  K 4.1 3.8 3.7  CL 102 107 104  CO2 23 22 22   BUN 36* 29* 33*  CREATININE 2.13* 1.85* 1.84*  GLUCOSE 108* 91 92  CALCIUM 8.9 8.7 8.8    Recent Labs Lab 10/31/12 0415  WBC 6.9  HGB 10.4*  HCT 31.6*  PLT 167   No results found for this basename: INR,  in the last 168 hours  I/O last 3 completed shifts: In: 390 [P.O.:240; IV Piggyback:150] Out: -   Imaging: No results found.  Assessment/Plan:  POD #16   LOS: 16 days  s/p Procedure(s): EXPLORATION AND DEBRIDEMENT OF LEFT GROIN WOUND PATCH ANGIOPLASTY  Appreciate plastic surgical input for possible rectus abdominis flap although at the present time I think we should continue to pack wound every 8 hours with moist gauze as it does seem to be improving. Patient and his son understand that bleeding is a possibility and also that if we proceed with flap but this could be unsuccessful in cause other problems. They agree with plan to continue wound packing for time being   [name], MD 11/06/2012 1:31 PM

## 2012-11-06 NOTE — Progress Notes (Addendum)
INITIAL NUTRITION ASSESSMENT  DOCUMENTATION CODES Per approved criteria  -Not Applicable   INTERVENTION:  Ensure Complete po BID, each supplement provides 350 kcal and 13 grams of protein.  MVI daily to support healing.  NUTRITION DIAGNOSIS: Increased nutrient needs related to wounds as evidenced by estimated nutrition needs.   Goal: Intake to meet >90% of estimated nutrition needs.  Monitor:  PO intake, wound healing, labs, weight trend.  Reason for Assessment: wounds  78 y.o. male  Admitting Dx: Non-healing left groin wound status post redo left femoral to anterior tibial bypass with evidence of hemorrhage  ASSESSMENT: S/P exploration and debridement of left groin wound on 8/20. Patient suspects that he has lost weight, but unable to quantify. He has some swelling in his left lower leg, which is likely masking weight loss. Current weight is 2% below usual weight. Patient is consuming 50-100% of meals. Drinks Ensure once daily at home. Tried Glucerna Shake this morning and "it went right through me."  Nutrition focused physical exam completed.  No muscle or subcutaneous fat depletion noticed. Patient has been up walking around the unit this morning.    Height: Ht Readings from Last 1 Encounters:  10/21/12 5\' 8"  (1.727 m)    Weight: Wt Readings from Last 1 Encounters:  10/22/12 186 lb 1.1 oz (84.4 kg)    Ideal Body Weight: 70 kg  % Ideal Body Weight: 121%  Wt Readings from Last 10 Encounters:  10/22/12 186 lb 1.1 oz (84.4 kg)  10/22/12 186 lb 1.1 oz (84.4 kg)  10/20/12 185 lb 9.6 oz (84.188 kg)  10/15/12 181 lb (82.101 kg)  10/13/12 182 lb (82.555 kg)  10/07/12 180 lb (81.647 kg)  10/06/12 185 lb 3.2 oz (84.006 kg)  10/06/12 185 lb 3.2 oz (84.006 kg)  09/28/12 189 lb (85.73 kg)  09/22/12 189 lb 9.6 oz (86.002 kg)    Usual Body Weight: 189 lb  % Usual Body Weight: 98%  BMI:  Body mass index is 28.3 kg/(m^2).  Estimated Nutritional Needs: Kcal:  2100-2300 Protein: 115-130 gm Fluid: 2.1-2.3 L  Skin: unstageable pressure ulcer to heel; leg/groin incisions; skin tears  Diet Order: Carb Control  EDUCATION NEEDS: -Education needs addressed-discussed ways to increase protein and calorie intake.   Intake/Output Summary (Last 24 hours) at 11/06/12 0929 Last data filed at 11/06/12 0800  Gross per 24 hour  Intake    510 ml  Output      0 ml  Net    510 ml    Last BM: 9/4   Labs:   Recent Labs Lab 10/31/12 0415 11/04/12 0415 11/06/12 0030  NA 137 141 137  K 4.1 3.8 3.7  CL 102 107 104  CO2 23 22 22   BUN 36* 29* 33*  CREATININE 2.13* 1.85* 1.84*  CALCIUM 8.9 8.7 8.8  MG  --   --  2.0  GLUCOSE 108* 91 92    CBG (last 3)   Recent Labs  11/05/12 1555 11/05/12 2146 11/06/12 0757  GLUCAP 150* 123* 99    Scheduled Meds: . allopurinol  200 mg Oral Daily  . atorvastatin  10 mg Oral Daily  . carvedilol  6.25 mg Oral BID WC  . colchicine  0.6 mg Oral BID  . docusate sodium  100 mg Oral Daily  . DULoxetine  30 mg Oral Daily  . enoxaparin (LOVENOX) injection  40 mg Subcutaneous Q24H  . finasteride  5 mg Oral Daily  . furosemide  40 mg Oral Daily  .  gabapentin  300 mg Oral TID  . insulin aspart  0-9 Units Subcutaneous TID WC  . insulin aspart protamine- aspart  8 Units Subcutaneous BID WC  . losartan  100 mg Oral Daily  . pantoprazole  40 mg Oral Daily  . vancomycin  750 mg Intravenous Q24H    Continuous Infusions:   Past Medical History  Diagnosis Date  . Hyperlipidemia   . Gout   . Benign prostatic hypertrophy   . Atrial fibrillation     sees Dr. Verdis Prime  . Chronic systolic dysfunction of left ventricle     a. mixed ischemic and nonischemic CM,  EF 35%. b. s/p AICD implantation.  . CHF (congestive heart failure)   . ED (erectile dysfunction)   . Arthritis   . Pacemaker     medtronic  . CAD (coronary artery disease)     a. s/p mid LAD stenting with DES 2008 Dr. Verdis Prime  . ICD  (implantable cardiac defibrillator) in place     medtronic, Dr. Johney Frame  . ICD (implantable cardiac defibrillator) in place     due for check in Feb/2013  . Sleep apnea     hx of "had surgery for"  . Hypertension     sees Dr. Claris Che  . Type II diabetes mellitus   . Automatic implantable cardioverter-defibrillator in situ   . Depression   . Numbness and tingling     Hx; 34f left foot  . PAD (peripheral artery disease)     Severe by PV angiogram 09/2011  . Renal artery stenosis     s/p stenting 2009  . PAD (peripheral artery disease)     s/p multiple LLE bypass grafts; left mid-distal SCA occlusion by 08/2012 duplex    Past Surgical History  Procedure Laterality Date  . Turp vaporization    . Cardiac defibrillator placement  12/26/09    pacemaker combo  . Cervical epidural injection  2013  . Femoral-tibial bypass graft  09/25/2011    Procedure: BYPASS GRAFT FEMORAL-TIBIAL ARTERY;  Surgeon: Pryor Ochoa, MD;  Location: [facility] OR;  Service: Vascular;  Laterality: Left;  Left Femoral - Anterior Tibial Bypass;  saphenous vein graft from left leg  . Intraoperative arteriogram  09/25/2011    Procedure: INTRA OPERATIVE ARTERIOGRAM;  Surgeon: Pryor Ochoa, MD;  Location: [facility] OR;  Service: Vascular;  Laterality: Left;  . Femoral-tibial bypass graft  02/07/2012    Procedure: BYPASS GRAFT FEMORAL-TIBIAL ARTERY;  Surgeon: Larina Earthly, MD;  Location: [facility] OR;  Service: Vascular;  Laterality: Left;  Thrombectomy Left Femoral - Tibial Bypass Graft  . Embolectomy  02/07/2012    Procedure: EMBOLECTOMY;  Surgeon: Larina Earthly, MD;  Location: [facility] OR;  Service: Vascular;  Laterality: Left;  . Femoral-tibial bypass graft  04/03/2012    Procedure: BYPASS GRAFT FEMORAL-TIBIAL ARTERY;  Surgeon: Pryor Ochoa, MD;  Location: [facility] OR;  Service: Vascular;  Laterality: Left;  Redo  . Insert / replace / remove pacemaker  2007  . Coronary angioplasty with stent placement  ~ 2000  . Coronary angioplasty    .  Uvulopalatopharyngoplasty (uppp)/tonsillectomy/septoplasty  06/30/2003    [name] 06/30/2003 (07/10/2012)  . Shoulder open rotator cuff repair Right 2001    repair of lacerated right/notes 10/11/1999  (07/10/2012)  . Foot surgery Right 03/20/2001    "have plates and screws in; didn't break it" (07/10/2012)  . Carpal tunnel release Right 2002    [name] 03/20/2001 (07/10/2012)  . Biceps tendon  repair Right 2001    [name] 03/20/2001 (07/10/2012)  . Renal artery stent  2009  . Femoral-popliteal bypass graft Left 09/16/2012    Procedure: LEFT FEMORAL-POPLITEAL BYPASS GRAFT WITH GORTEX Propaten Graft 6x80 Thin Wall and Left lower leg Angiogram;  Surgeon: Pryor Ochoa, MD;  Location: [facility] OR;  Service: Vascular;  Laterality: Left;  . Colonoscopy      Hx; of  . Tonsillectomy    . Adenoidectomy      Hx; of   . Femoral-tibial bypass graft Left 09/30/2012    Procedure: REDO LEFT FEMORAL-ANTERIOR TIBIAL ARTERY BYPASS USING COMPOSITE CEPHALIC AND BASILIC VEIN GRAFT FROM LEFT ARM;  Surgeon: Pryor Ochoa, MD;  Location: [facility] OR;  Service: Vascular;  Laterality: Left;  . I&d extremity Left 10/21/2012    Procedure: EXPLORATION AND DEBRIDEMENT OF LEFT GROIN WOUND;  Surgeon: Pryor Ochoa, MD;  Location: [facility] OR;  Service: Vascular;  Laterality: Left;  . Patch angioplasty Left 10/21/2012    Procedure: PATCH ANGIOPLASTY;  Surgeon: Pryor Ochoa, MD;  Location: [facility] OR;  Service: Vascular;  Laterality: Left;    Joaquin Courts, RD, LDN, CNSC Pager [phone] After Hours Pager [phone]

## 2012-11-06 NOTE — Progress Notes (Signed)
Utilization review completed.  

## 2012-11-06 NOTE — Progress Notes (Signed)
At 0017, patient had 8 beat run of v-tach. BP 123/49. Pt resting in bed, otherwise asymptomatic. Dr. Edilia Bo notified. Labs ordered. Will continue to monitor.   Rochele Pages, RN

## 2012-11-07 LAB — GLUCOSE, CAPILLARY
Glucose-Capillary: 197 mg/dL — ABNORMAL HIGH (ref 70–99)
Glucose-Capillary: 138 mg/dL — ABNORMAL HIGH (ref 70–99)
Glucose-Capillary: 122 mg/dL — ABNORMAL HIGH (ref 70–99)

## 2012-11-07 NOTE — Progress Notes (Addendum)
[facility] Progress Note  11/07/2012 8:10 AM HD 17  Subjective:  Ready to go home  Afebrile VSS 96% RA  Filed Vitals:   11/06/12 1940  BP: 139/71  Pulse:   Temp: 98.1 F (36.7 C)  Resp:     Physical Exam: Cardiac:  regular Lungs:  Non labored Extremities:  Left groin with fibrinous tissue; vein graft slightly exposed; no purulence in wound.    CBC    Component Value Date/Time   WBC 6.9 10/31/2012 0415   RBC 3.82* 10/31/2012 0415   HGB 10.4* 10/31/2012 0415   HCT 31.6* 10/31/2012 0415   PLT 167 10/31/2012 0415   MCV 82.7 10/31/2012 0415   MCH 27.2 10/31/2012 0415   MCHC 32.9 10/31/2012 0415   RDW 15.7* 10/31/2012 0415   LYMPHSABS 0.9 10/15/2012 1136   MONOABS 0.9 10/15/2012 1136   EOSABS 0.0 10/15/2012 1136   BASOSABS 0.0 10/15/2012 1136    BMET    Component Value Date/Time   NA 137 11/06/2012 0030   K 3.7 11/06/2012 0030   CL 104 11/06/2012 0030   CO2 22 11/06/2012 0030   GLUCOSE 92 11/06/2012 0030   BUN 33* 11/06/2012 0030   CREATININE 1.84* 11/06/2012 0030   CREATININE 2.68* 09/07/2012 0812   CALCIUM 8.8 11/06/2012 0030   GFRNONAA 34* 11/06/2012 0030   GFRAA 39* 11/06/2012 0030    INR    Component Value Date/Time   INR 1.53* 10/21/2012 0854   INR 4.0 10/08/2012 1107    No intake or output data in the 24 hours ending 11/07/12 0810   Assessment/Plan:  78 y.o. male is  Here with infected left groin wound with vein graft exposed HD 17  -wound is slowly granulating -pt is afebrile and WBC WNL -continue meticulous wound care and make sure dressing changes are wet to prevent desiccation of vein graft.  -continue in stepdown for close supervision of wound -MRSA on wound culture 10/21/12.    -continue Vancomycin  Doreatha Massed, PA-C [facility] [phone] 11/07/2012 8:10 AM  Addendum  I have independently interviewed and examined the patient, and I agree with the physician assistant's findings.  No recent wound cultures to guide abx.  Dr.  Hart Rochester wants to continue Zosyn/Vanco empirically.  Wound over vein graft mostly clean some fibrinous exudate over lateral aspect of wound.  Only chance of salvaging of this vein bypass is continued meticulous wound care.  If patient bleeds, only option is ligation of bypass which will result in an amputation in this patient.  Leonides Sake, MD [facility] [place] Office: [phone] Pager: [phone]  11/07/2012, 8:18 AM

## 2012-11-07 NOTE — Progress Notes (Signed)
ANTIBIOTIC CONSULT NOTE - FOLLOW UP  Pharmacy Consult:  Vancomycin Indication:  MRSA infected left groin wound  Allergies  Allergen Reactions  . Other Other (See Comments)    Plastic tape tears skin   Labs:  Recent Labs  11/06/12 0030  CREATININE 1.84*   Estimated Creatinine Clearance: 36.1 ml/min (by C-G formula based on Cr of 1.84). No results found for this basename: VANCOTROUGH, VANCOPEAK, VANCORANDOM, GENTTROUGH, GENTPEAK, GENTRANDOM, TOBRATROUGH, TOBRAPEAK, TOBRARND, AMIKACINPEAK, AMIKACINTROU, AMIKACIN,  in the last 72 hours   Microbiology: Recent Results (from the past 720 hour(s))  ANAEROBIC CULTURE     Status: None   Collection Time    10/21/12 11:59 AM      Result Value Range Status   Specimen Description ABSCESS GROIN LEFT   Final   Special Requests PT ON ZINACEF   Final   Gram Stain     Final   Value: MODERATE WBC PRESENT, PREDOMINANTLY PMN     NO SQUAMOUS EPITHELIAL CELLS SEEN     FEW GRAM POSITIVE COCCI     IN PAIRS RARE GRAM NEGATIVE RODS     Performed at [facility]   Culture     Final   Value: NO ANAEROBES ISOLATED     Performed at [facility]   Report Status 10/26/2012 FINAL   Final  CULTURE, ROUTINE-ABSCESS     Status: None   Collection Time    10/21/12 11:59 AM      Result Value Range Status   Specimen Description ABSCESS GROIN LEFT   Final   Special Requests PT ON ZINACEF   Final   Gram Stain     Final   Value: MODERATE WBC PRESENT, PREDOMINANTLY PMN     NO SQUAMOUS EPITHELIAL CELLS SEEN     FEW GRAM POSITIVE COCCI     IN PAIRS RARE GRAM NEGATIVE RODS     Performed at [facility]   Culture     Final   Value: MODERATE METHICILLIN RESISTANT STAPHYLOCOCCUS AUREUS     Note: RIFAMPIN AND GENTAMICIN SHOULD NOT BE USED AS SINGLE DRUGS FOR TREATMENT OF STAPH INFECTIONS. CRITICAL RESULT CALLED TO, READ BACK BY AND VERIFIED WITH: JAMIE YATES @ 11:19AM  10/24/12 BY DWEEKS     Performed at [facility]   Report Status  10/24/2012 FINAL   Final   Organism ID, Bacteria METHICILLIN RESISTANT STAPHYLOCOCCUS AUREUS   Final  CLOSTRIDIUM DIFFICILE BY PCR     Status: None   Collection Time    10/23/12  1:28 PM      Result Value Range Status   C difficile by pcr NEGATIVE  NEGATIVE Final    Assessment: [name] on vancomycin for MRSA infection of left groin abscess.  Day 17 of vancomycin. Renal fxn stable as of 9/5 and pt remains afebrile. Last vanc trough was therapeutic.   Goal of Therapy:  Vancomycin trough level 15-20 mcg/ml  Plan:  - Continue Vancomycin 750mg  IV Q24h - Check a vanc trough tomorrow (weekly vanc trough) to ensure dose remains appropriate - Also check a BMET tomorrow to monitor renal function  Lysle Pearl, PharmD, BCPS Pager # [phone] 11/07/2012 11:12 AM

## 2012-11-08 LAB — BASIC METABOLIC PANEL
Calcium: 9 mg/dL (ref 8.4–10.5)
GFR calc Af Amer: 35 mL/min — ABNORMAL LOW (ref >90)
GFR calc non Af Amer: 31 mL/min — ABNORMAL LOW (ref >90)
Sodium: 137 mEq/L (ref 135–145)

## 2012-11-08 LAB — GLUCOSE, CAPILLARY
Glucose-Capillary: 110 mg/dL — ABNORMAL HIGH (ref 70–99)
Glucose-Capillary: 200 mg/dL — ABNORMAL HIGH (ref 70–99)

## 2012-11-08 LAB — VANCOMYCIN, TROUGH: Vancomycin Tr: 17.9 ug/mL (ref 10.0–20.0)

## 2012-11-08 NOTE — Progress Notes (Signed)
Pharmacy: Vancomycin  78yom continues on day #18 of vancomycin for MRSA infection of left groin abscess. Vancomycin trough is therapeutic at 17.9 (goal 15-20). Renal function is slightly worse with sCr up to 2 and CrCl 57ml/min.  Plan: 1) Continue vancomycin 750mg  IV q24 2) Continue to follow renal function - will order daily BMET  Louie Casa, PharmD, BCPS 11/08/12, 7:38 PM

## 2012-11-08 NOTE — Progress Notes (Addendum)
[facility] Progress Note  11/08/2012 8:54 AM HD 18  Subjective:  States he feels good; has issues outside of hospital that is weighing heavily on him.  Afebrile x 24 hrs VSS  Filed Vitals:   11/08/12 0700  BP:   Pulse:   Temp: 98.6 F (37 C)  Resp:     Physical Exam: Cardiac:  regular Lungs:  Non labored Extremities:  Left groin wound with good granulation tissue laterally; still with fibrinous exudate throughout wound.  Essentially unchanged from yesterday. + doppler signal in the left PT/DP; less swelling today.  CBC    Component Value Date/Time   WBC 6.9 10/31/2012 0415   RBC 3.82* 10/31/2012 0415   HGB 10.4* 10/31/2012 0415   HCT 31.6* 10/31/2012 0415   PLT 167 10/31/2012 0415   MCV 82.7 10/31/2012 0415   MCH 27.2 10/31/2012 0415   MCHC 32.9 10/31/2012 0415   RDW 15.7* 10/31/2012 0415   LYMPHSABS 0.9 10/15/2012 1136   MONOABS 0.9 10/15/2012 1136   EOSABS 0.0 10/15/2012 1136   BASOSABS 0.0 10/15/2012 1136    BMET    Component Value Date/Time   NA 137 11/06/2012 0030   K 3.7 11/06/2012 0030   CL 104 11/06/2012 0030   CO2 22 11/06/2012 0030   GLUCOSE 92 11/06/2012 0030   BUN 33* 11/06/2012 0030   CREATININE 1.84* 11/06/2012 0030   CREATININE 2.68* 09/07/2012 0812   CALCIUM 8.8 11/06/2012 0030   GFRNONAA 34* 11/06/2012 0030   GFRAA 39* 11/06/2012 0030    INR    Component Value Date/Time   INR 1.53* 10/21/2012 0854   INR 4.0 10/08/2012 1107     Intake/Output Summary (Last 24 hours) at 11/08/12 0854 Last data filed at 11/07/12 1928  Gross per 24 hour  Intake    600 ml  Output      0 ml  Net    600 ml     Assessment/Plan:  78 y.o. male is here with infected left groin wound with vein graft exposed  HD 18  -pt continues to be afebrile -continue meticulous wound care with wet to dry dressing changes every 8 hours making sure dressing changes are wet to prevent desiccation of vein graft -continue in stepdown for close supervision of wound -continue vancomycin  per pharmacy-MRSA per wound culture 10/21/12 -BMP ordered for today-results are not back -extensive conversation with pt about meticulous wound care and his guarded situation.  Explained to pt the need for vein graft to be covered before placing wound vac.  Explained there is not a specific timeline for the healing process.     Doreatha Massed, PA-C [facility] [phone] 11/08/2012 8:54 AM   Addendum  I have independently interviewed and examined the patient, and I agree with the physician assistant's findings.  No significant changes since yesterday.  No bleeding episodes so far.  Leonides Sake, MD [facility] [place] Office: [phone] Pager: [phone]  11/08/2012, 9:39 AM

## 2012-11-09 LAB — BASIC METABOLIC PANEL
BUN: 34 mg/dL — ABNORMAL HIGH (ref 6–23)
CO2: 24 mEq/L (ref 19–32)
Calcium: 8.7 mg/dL (ref 8.4–10.5)
Creatinine, Ser: 2.17 mg/dL — ABNORMAL HIGH (ref 0.50–1.35)
Glucose, Bld: 123 mg/dL — ABNORMAL HIGH (ref 70–99)

## 2012-11-09 LAB — GLUCOSE, CAPILLARY
Glucose-Capillary: 157 mg/dL — ABNORMAL HIGH (ref 70–99)
Glucose-Capillary: 193 mg/dL — ABNORMAL HIGH (ref 70–99)

## 2012-11-09 NOTE — Progress Notes (Signed)
Utilization review completed.  

## 2012-11-09 NOTE — Progress Notes (Signed)
Patient ID: [name], male   DOB: [birth year], 78 y.o.   MRN: [id number] Vascular Surgery Progress Note  Subjective: Patient reports decreased pain with dressing changes left inguinal area. Denies any chills and fever. No bleeding from left inguinal wound. Moist gauze dressing changes every 8 hours  Objective:  Filed Vitals:   11/09/12 0720  BP: 156/59  Pulse: 71  Temp: 97.4 F (36.3 C)  Resp: 21    General alert and oriented x3 in no apparent stress 3+ pulse in the left femoral to anterior tibial graft-redo redo Left foot adequately perfused Left inguinal wound exam and. The vein graft seems less exposed in the inferior aspect of the wound today. Continues to be some fibrinous material surrounding vein graft which and unable to breathe further.  Wound recultured today   Labs:  Recent Labs Lab 11/06/12 0030 11/08/12 1821 11/09/12 0446  CREATININE 1.84* 2.01* 2.17*    Recent Labs Lab 11/06/12 0030 11/08/12 1821 11/09/12 0446  NA 137 137 138  K 3.7 4.2 4.0  CL 104 103 106  CO2 22 23 24   BUN 33* 33* 34*  CREATININE 1.84* 2.01* 2.17*  GLUCOSE 92 147* 123*  CALCIUM 8.8 9.0 8.7   No results found for this basename: WBC, HGB, HCT, PLT,  in the last 168 hours No results found for this basename: INR,  in the last 168 hours  I/O last 3 completed shifts: In: 600 [P.O.:600] Out: 0   Imaging: No results found.  Assessment/Plan:    LOS: 19 days  s/p Procedure(s): EXPLORATION AND DEBRIDEMENT OF LEFT GROIN WOUND PATCH ANGIOPLASTY  Had again a long discussion with patient's son and patient. Continues to feel the best option at this point is moist dressing changes and look for evidence of coverage of the vein graft. At that point we may apply a VAC dressing and examine for a few days in hospital and then DC home with VAC. Will await the repeat cultures done today.   [name], MD 11/09/2012 1:55 PM

## 2012-11-10 LAB — GLUCOSE, CAPILLARY: Glucose-Capillary: 98 mg/dL (ref 70–99)

## 2012-11-10 LAB — BASIC METABOLIC PANEL
CO2: 23 mEq/L (ref 19–32)
Calcium: 9.3 mg/dL (ref 8.4–10.5)
Sodium: 136 mEq/L (ref 135–145)

## 2012-11-10 NOTE — Progress Notes (Addendum)
  VASCULAR SURGERY BYPASS PROGRESS NOTE   20 Days Post-Op - Day 20 Vancomycin  SUBJECTIVE: pt doing well. Tolerating dressing changes. Ambulating New cultures pending  PHYSICAL EXAM: BP Readings from Last 3 Encounters:  11/09/12 128/56  11/09/12 128/56  10/20/12 142/66   Temp Readings from Last 3 Encounters:  11/09/12 97.9 F (36.6 C) Oral  11/09/12 97.9 F (36.6 C) Oral  10/20/12 97.8 F (36.6 C) Oral   Pulse Readings from Last 3 Encounters:  11/09/12 71  11/09/12 71  10/20/12 72   SpO2 Readings from Last 3 Encounters:  11/09/12 99%  11/09/12 99%  10/20/12 100%     Intake/Output Summary (Last 24 hours) at 11/10/12 0835 Last data filed at 11/10/12 0300  Gross per 24 hour  Intake    360 ml  Output      0 ml  Net    360 ml    Extremities: Incisions clean, dry and intact Wound cleaner with granulation at the sides. Graft Pulse Palpable lateral leg  LABS: Lab Results  Component Value Date   WBC 6.9 10/31/2012   HGB 10.4* 10/31/2012   HCT 31.6* 10/31/2012   MCV 82.7 10/31/2012   PLT 167 10/31/2012   Lab Results  Component Value Date   CREATININE 1.91* 11/10/2012   Lab Results  Component Value Date   INR 1.53* 10/21/2012       ASSESSMENT: 20 Days Post-Op s/p Exploration left inguinal wound with proximal control common femoral artery, insertion patch angioplasty using bovine pericardial patch, revision of left femoral to anterior tibial bypass with reattachment of the vein to bovine patch, debridement left groin wound with primary closure of the majority of wound  PLAN: Vanc day 20; Cr stable will need 4-6 weeks total of vancomycin/linezolid New culture wound pending Ambulate  Wound Management: continue TID wet to dry dressings; poss vac later in the week  DVT prophylaxis: yes  Agree with above assessment

## 2012-11-10 NOTE — Clinical Social Work Note (Signed)
Clinical Social Work Department BRIEF PSYCHOSOCIAL ASSESSMENT 11/10/2012  Patient:  [name], [name]     Account Number:  [id number]     Admit date:  10/21/2012  Clinical Social Worker:  Hulan Fray  Date/Time:  11/10/2012 01:59 PM  Referred by:  Care Management  Date Referred:  11/09/2012 Referred for  Other - See comment   Other Referral:   Son requested to speak with CSW   Interview type:  Patient Other interview type:   Son- [name]    PSYCHOSOCIAL DATA Living Status:  ALONE Admitted from facility:   Level of care:   Primary support name:  Ronne Stefanski Primary support relationship to patient:  CHILD, ADULT Degree of support available:   supportive    CURRENT CONCERNS Current Concerns  Other - See comment   Other Concerns:   Patient being able to drive and make doctor appointments    SOCIAL WORK ASSESSMENT / PLAN Clinical Social Worker received notification from CM that patient's son requested to speak with CSW regarding discharge plans. CSW introduced self and explained reason for visit. Patient's son was at bedside. Per son, he had questions regarding patient going into facility vs going home. Per son, patient can do his ADL's but is concern is patient driving to MD appointments. CSW explained SNF process and that with patient's insurance, it would require prior authorization. Son voiced understanding. CSW provided SNF packet to son. Per son, patient will have some family, if he is discharged home that will be coming up to stay for two weeks. CSW explained briefly the private duty list, if patient goes home and that it would be out of pocket and informed CM. Son appeared to have questions about possible home health equipment patient will need, if he were to discharge home.    CSW noticed PT signed off on 10-30-12, due to patient being at his baseline. CSW will follow as needed.   Assessment/plan status:  Other - See comment Other assessment/ plan:   CSW will follow  as needed   Information/referral to community resources:   SNF packet  Notified CM for private duty list    PATIENT'S/FAMILY'S RESPONSE TO PLAN OF CARE: Patient and son were agreeable to speak with CSW. Patient appeared to prefer to return home, but son appeared concerned about how patient will get to his MD appointments. Per son, patient does live alone, but will have family for about 2 weeks when he is released.

## 2012-11-11 LAB — BASIC METABOLIC PANEL
CO2: 25 mEq/L (ref 19–32)
Chloride: 102 mEq/L (ref 96–112)
Glucose, Bld: 98 mg/dL (ref 70–99)
Potassium: 4.4 mEq/L (ref 3.5–5.1)
Sodium: 136 mEq/L (ref 135–145)

## 2012-11-11 LAB — WOUND CULTURE

## 2012-11-11 NOTE — Progress Notes (Signed)
Patient ID: [name], male   DOB: 04/02/1934, 78 y.o.   MRN: [id number] Vascular Surgery Progress Note  Subjective: no complaints per patient  Objective:  Filed Vitals:   11/11/12 0758  BP:   Pulse: 71  Temp:   Resp: 16    l groin wound examined Looks clean----lateral walls granulating Small amt of vein graft exposed 3+ graft pulse   Labs:  Recent Labs Lab 11/09/12 0446 11/10/12 0409 11/11/12 0417  CREATININE 2.17* 1.91* 2.00*    Recent Labs Lab 11/09/12 0446 11/10/12 0409 11/11/12 0417  NA 138 136 136  K 4.0 4.3 4.4  CL 106 103 102  CO2 24 23 25  BUN 34* 32* 33*  CREATININE 2.17* 1.91* 2.00*  GLUCOSE 123* 92 98  CALCIUM 8.7 9.3 9.2   No results found for this basename: WBC, HGB, HCT, PLT,  in the last 168 hours No results found for this basename: INR,  in the last 168 hours  I/O last 3 completed shifts: In: 1470 [P.O.:1320; IV Piggyback:150] Out: -   Imaging: No results found.  Assessment/Plan:    LOS: 21 days  s/p Procedure(s): EXPLORATION AND DEBRIDEMENT OF LEFT GROIN WOUND PATCH ANGIOPLASTY  Left groin wound is clean and very small amount of vein graft is exposed in the inferior aspect. Tissue seems pliable enough to close wound. Will take 2 OR tomorrow an attempt wound closure. Discusses with patient and he realizes that one may need to be reopened if infection reoccurs. No staph or strep pyogenes in most recent culture   James Lawson, MD 11/11/2012 8:21 AM           

## 2012-11-11 NOTE — Progress Notes (Signed)
Pharmacy: Vancomycin  78yom continues on day #21 of vancomycin for MRSA infection of left groin abscess. Vancomycin trough is therapeutic at 17.9 (goal 15-20). Renal function is stable Plan: 1) Continue vancomycin 750mg  IV q24 2) Continue to follow renal function - will order daily BMET  Talbert Cage, PharmD

## 2012-11-12 ENCOUNTER — Encounter (HOSPITAL_COMMUNITY): Payer: Self-pay | Admitting: *Deleted

## 2012-11-12 ENCOUNTER — Encounter (HOSPITAL_COMMUNITY)
Admission: RE | Disposition: A | Discharge status: Home / Self Care | Payer: Self-pay | Source: Ambulatory Visit | Attending: Vascular Surgery

## 2012-11-12 ENCOUNTER — Inpatient Hospital Stay (HOSPITAL_COMMUNITY): Payer: Medicare Other | Admitting: *Deleted

## 2012-11-12 ENCOUNTER — Telehealth: Payer: Self-pay | Admitting: Vascular Surgery

## 2012-11-12 DIAGNOSIS — T8189XA Other complications of procedures, not elsewhere classified, initial encounter: Secondary | ICD-10-CM

## 2012-11-12 HISTORY — PX: GROIN DEBRIDEMENT: SHX5159

## 2012-11-12 LAB — PROTIME-INR
INR: 1.07 (ref 0.00–1.49)
Prothrombin Time: 13.7 seconds (ref 11.6–15.2)

## 2012-11-12 LAB — CBC
MCH: 26.4 pg (ref 26.0–34.0)
MCHC: 32 g/dL (ref 30.0–36.0)
MCV: 82.5 fL (ref 78.0–100.0)
Platelets: 193 10*3/uL (ref 150–400)
RDW: 16 % — ABNORMAL HIGH (ref 11.5–15.5)

## 2012-11-12 LAB — BASIC METABOLIC PANEL
CO2: 21 mEq/L (ref 19–32)
Calcium: 9.2 mg/dL (ref 8.4–10.5)
Creatinine, Ser: 2.12 mg/dL — ABNORMAL HIGH (ref 0.50–1.35)
Glucose, Bld: 100 mg/dL — ABNORMAL HIGH (ref 70–99)

## 2012-11-12 LAB — GLUCOSE, CAPILLARY
Glucose-Capillary: 90 mg/dL (ref 70–99)
Glucose-Capillary: 205 mg/dL — ABNORMAL HIGH (ref 70–99)

## 2012-11-12 SURGERY — DEBRIDEMENT, INGUINAL REGION
Anesthesia: Monitor Anesthesia Care | Site: Groin | Laterality: Left | Wound class: Dirty or Infected

## 2012-11-12 MED ORDER — FENTANYL CITRATE 0.05 MG/ML IJ SOLN
INTRAMUSCULAR | Status: DC | PRN
  Administered 2012-11-12 (×4): 25 ug via INTRAVENOUS

## 2012-11-12 MED ORDER — LACTATED RINGERS IV SOLN
INTRAVENOUS | Status: DC | PRN
  Administered 2012-11-12: 07:00:00 via INTRAVENOUS

## 2012-11-12 MED ORDER — 0.9 % SODIUM CHLORIDE (POUR BTL) OPTIME
TOPICAL | Status: DC | PRN
  Administered 2012-11-12: 1000 mL

## 2012-11-12 MED ORDER — PROPOFOL 10 MG/ML IV BOLUS
INTRAVENOUS | Status: DC | PRN
  Administered 2012-11-12 (×3): 10 mg via INTRAVENOUS
  Administered 2012-11-12: 30 mg via INTRAVENOUS

## 2012-11-12 MED ORDER — LIDOCAINE-EPINEPHRINE (PF) 1 %-1:200000 IJ SOLN
INTRAMUSCULAR | Status: DC | PRN
  Administered 2012-11-12: 10 mL

## 2012-11-12 MED ORDER — LIDOCAINE HCL (CARDIAC) 20 MG/ML IV SOLN
INTRAVENOUS | Status: DC | PRN
  Administered 2012-11-12: 30 mg via INTRAVENOUS

## 2012-11-12 MED ORDER — ALLOPURINOL 100 MG PO TABS: 200.0000 mg | ORAL_TABLET | Freq: Every day | ORAL | Status: DC

## 2012-11-12 MED ORDER — DOXYCYCLINE HYCLATE 100 MG PO TABS: 100.0000 mg | ORAL_TABLET | Freq: Two times a day (BID) | ORAL | Status: DC

## 2012-11-12 MED ORDER — LIDOCAINE-EPINEPHRINE (PF) 1 %-1:200000 IJ SOLN
INTRAMUSCULAR | Status: AC
  Filled 2012-11-12: qty 10

## 2012-11-12 MED ORDER — OXYCODONE-ACETAMINOPHEN 5-325 MG PO TABS: 1.0000 | ORAL_TABLET | ORAL | Status: DC | PRN

## 2012-11-12 MED ORDER — OXYCODONE HCL 5 MG PO TABS: 5.0000 mg | ORAL_TABLET | Freq: Once | ORAL | Status: DC | PRN

## 2012-11-12 MED ORDER — OXYCODONE HCL 5 MG/5ML PO SOLN: 5.0000 mg | Freq: Once | ORAL | Status: DC | PRN

## 2012-11-12 MED ORDER — WARFARIN SODIUM 2.5 MG PO TABS
2.5000 mg | ORAL_TABLET | Freq: Every day | ORAL | Status: DC
  Administered 2012-11-12 – 2012-11-14 (×3): 2.5 mg via ORAL
  Filled 2012-11-12 (×4): qty 1

## 2012-11-12 MED ORDER — HYDROMORPHONE HCL PF 1 MG/ML IJ SOLN: 0.2500 mg | INTRAMUSCULAR | Status: DC | PRN

## 2012-11-12 MED ORDER — DOXYCYCLINE HYCLATE 50 MG PO CAPS: 100.0000 mg | ORAL_CAPSULE | Freq: Two times a day (BID) | ORAL | Status: DC

## 2012-11-12 MED ORDER — PROMETHAZINE HCL 25 MG/ML IJ SOLN: 6.2500 mg | INTRAMUSCULAR | Status: DC | PRN

## 2012-11-12 MED ORDER — MIDAZOLAM HCL 5 MG/5ML IJ SOLN
INTRAMUSCULAR | Status: DC | PRN
  Administered 2012-11-12 (×2): 1 mg via INTRAVENOUS

## 2012-11-12 MED ORDER — WARFARIN - PHYSICIAN DOSING INPATIENT
Freq: Every day | Status: DC
  Administered 2012-11-12: 18:00:00

## 2012-11-12 SURGICAL SUPPLY — 34 items
CANISTER SUCTION 2500CC (MISCELLANEOUS) ×2 IMPLANT
CLOTH BEACON ORANGE TIMEOUT ST (SAFETY) ×2 IMPLANT
COVER SURGICAL LIGHT HANDLE (MISCELLANEOUS) ×2 IMPLANT
ELECT REM PT RETURN 9FT ADLT (ELECTROSURGICAL) ×2
ELECTRODE REM PT RTRN 9FT ADLT (ELECTROSURGICAL) ×1 IMPLANT
GEL ULTRASOUND 8.5O AQUASONIC (MISCELLANEOUS) ×1 IMPLANT
GLOVE BIOGEL PI IND STRL 6.5 (GLOVE) IMPLANT
GLOVE BIOGEL PI INDICATOR 6.5 (GLOVE) ×1
GLOVE SS BIOGEL STRL SZ 7 (GLOVE) ×1 IMPLANT
GLOVE SUPERSENSE BIOGEL SZ 7 (GLOVE) ×1
GLOVE SURG SS PI 7.0 STRL IVOR (GLOVE) ×1 IMPLANT
GOWN PREVENTION PLUS XLARGE (GOWN DISPOSABLE) ×1 IMPLANT
GOWN STRL NON-REIN LRG LVL3 (GOWN DISPOSABLE) ×6 IMPLANT
GOWN STRL REIN XL XLG (GOWN DISPOSABLE) ×1 IMPLANT
KIT BASIN OR (CUSTOM PROCEDURE TRAY) ×2 IMPLANT
KIT ROOM TURNOVER OR (KITS) ×2 IMPLANT
NDL HYPO 25GX1X1/2 BEV (NEEDLE) IMPLANT
NEEDLE HYPO 25GX1X1/2 BEV (NEEDLE) ×2 IMPLANT
NS IRRIG 1000ML POUR BTL (IV SOLUTION) ×2 IMPLANT
PACK GENERAL/GYN (CUSTOM PROCEDURE TRAY) ×2 IMPLANT
PACK UNIVERSAL I (CUSTOM PROCEDURE TRAY) ×2 IMPLANT
PAD ARMBOARD 7.5X6 YLW CONV (MISCELLANEOUS) ×4 IMPLANT
SPONGE GAUZE 4X4 12PLY (GAUZE/BANDAGES/DRESSINGS) ×2 IMPLANT
STAPLER VISISTAT 35W (STAPLE) IMPLANT
SUT ETHILON 3 0 PS 1 (SUTURE) IMPLANT
SUT PROLENE 0 CTX CR/8 (SUTURE) ×1 IMPLANT
SUT VIC AB 2-0 CTX 36 (SUTURE) IMPLANT
SUT VIC AB 3-0 SH 27 (SUTURE)
SUT VIC AB 3-0 SH 27X BRD (SUTURE) IMPLANT
SYR CONTROL 10ML LL (SYRINGE) ×1 IMPLANT
TAPE PAPER 3X10 WHT MICROPORE (GAUZE/BANDAGES/DRESSINGS) ×1 IMPLANT
TOWEL OR 17X24 6PK STRL BLUE (TOWEL DISPOSABLE) ×2 IMPLANT
TOWEL OR 17X26 10 PK STRL BLUE (TOWEL DISPOSABLE) ×2 IMPLANT
WATER STERILE IRR 1000ML POUR (IV SOLUTION) ×2 IMPLANT

## 2012-11-12 NOTE — Anesthesia Postprocedure Evaluation (Signed)
Anesthesia Post Note  Patient: [name]  Procedure(s) Performed: Procedure(s) (LRB): CLOSURE INGUINAL WOUND (Left)  Anesthesia type: MAC  Patient location: PACU  Post pain: Pain level controlled  Post assessment: Patient's Cardiovascular Status Stable  Last Vitals:  Filed Vitals:   11/12/12 0928  BP: 146/67  Pulse:   Temp:   Resp: 18    Post vital signs: Reviewed and stable  Level of consciousness: sedated  Complications: No apparent anesthesia complications

## 2012-11-12 NOTE — Interval H&P Note (Signed)
History and Physical Interval Note:  11/12/2012 7:33 AM  [name]  has presented today for surgery, with the diagnosis of LEFT INGUINAL WOUND NONHEALING  The various methods of treatment have been discussed with the patient and family. After consideration of risks, benefits and other options for treatment, the patient has consented to  Procedure(s): CLOSURE INGUINAL WOUND (Left) as a surgical intervention .  The patient's history has been reviewed, patient examined, no change in status, stable for surgery.  I have reviewed the patient's chart and labs.  Questions were answered to the patient's satisfaction.     [name]

## 2012-11-12 NOTE — Telephone Encounter (Addendum)
Message copied by Rosalyn Charters on Thu Nov 12, 2012  1:35 PM ------      Message from: [name]      Created: Thu Nov 12, 2012 11:29 AM       Pt needs 2 weeks - has stitches, Hart Rochester  notified patient of fu appt. on 12-08-12 at 10:30 with dr. Hart Rochester ------

## 2012-11-12 NOTE — Clinical Social Work Note (Signed)
Clinical Social Worker will sign off, as patient is planned for home with home health services. CSW updated [facility] SW on discharge plans for home.   Rozetta Nunnery MSW, [employer] [phone]

## 2012-11-12 NOTE — Transfer of Care (Signed)
Immediate Anesthesia Transfer of Care Note  Patient: [name]  Procedure(s) Performed: Procedure(s): CLOSURE INGUINAL WOUND (Left)  Patient Location: PACU  Anesthesia Type:MAC  Level of Consciousness: awake, alert , oriented, patient cooperative and responds to stimulation  Airway & Oxygen Therapy: Patient Spontanous Breathing  Post-op Assessment: Report given to PACU RN, Post -op Vital signs reviewed and stable and Patient moving all extremities X 4  Post vital signs: Reviewed and stable  Complications: No apparent anesthesia complications

## 2012-11-12 NOTE — Anesthesia Preprocedure Evaluation (Addendum)
Anesthesia Evaluation  Patient identified by MRN, date of birth, ID band Patient awake    Reviewed: Allergy & Precautions, H&P , NPO status , Patient's Chart, lab work & pertinent test results, reviewed documented beta blocker date and time   Airway Mallampati: I TM Distance: >3 FB Neck ROM: Full    Dental  (+) Teeth Intact and Dental Advisory Given   Pulmonary sleep apnea ,  breath sounds clear to auscultation        Cardiovascular hypertension, Pt. on medications and Pt. on home beta blockers + CAD, + Cardiac Stents, + Peripheral Vascular Disease and +CHF + dysrhythmias Atrial Fibrillation + pacemaker + Cardiac Defibrillator Rhythm:Regular Rate:Normal  a. mixed ischemic and nonischemic CM,  EF 35%. b. s/p AICD implantation   Neuro/Psych Depression negative neurological ROS     GI/Hepatic negative GI ROS, Neg liver ROS,   Endo/Other  diabetes, Insulin Dependent  Renal/GU Renal disease     Musculoskeletal   Abdominal Normal abdominal exam  (+)   Peds  Hematology  (+) Blood dyscrasia, ,   Anesthesia Other Findings   Reproductive/Obstetrics                          Anesthesia Physical Anesthesia Plan  ASA: III  Anesthesia Plan: MAC   Post-op Pain Management:    Induction: Intravenous  Airway Management Planned: Simple Face Mask  Additional Equipment:   Intra-op Plan:   Post-operative Plan: Extubation in OR  Informed Consent: I have reviewed the patients History and Physical, chart, labs and discussed the procedure including the risks, benefits and alternatives for the proposed anesthesia with the patient or authorized representative who has indicated his/her understanding and acceptance.   Dental advisory given  Plan Discussed with: Anesthesiologist, Surgeon and CRNA  Anesthesia Plan Comments:       Anesthesia Quick Evaluation

## 2012-11-12 NOTE — Progress Notes (Signed)
AICD DOES NOT NEED TO BE INTERROGATED OR HAVE ANYTHING DONE TO IT PRIOR TO PT LEAVING PACU PER DR.SINGER.

## 2012-11-12 NOTE — Op Note (Signed)
OPERATIVE REPORT  Date of Surgery: 10/21/2012 - 11/12/2012  Surgeon: [name], MD  Assistant: Nurse  Pre-op Diagnosis: LEFT INGUINAL WOUND NONHEALING  Post-op Diagnosis: LEFT INGUINAL WOUND NONHEALING  Procedure: Procedure(s): CLOSURE INGUINAL WOUND-left  Anesthesia: General  EBL: 0  Complications: None  Procedure Details: The patient was taken the operating room placed in supine position at which time general sedation was performed. Patient is not intubated. Left inguinal wound and leg were prepped Betadine scrub and solution draped in routine sterile manner. There was a femoral to anterior tibial graft located subcutaneously a lateral aspect the left leg which had an excellent pulse and the wound was opened with a vein graft exposed over about a 1-2 cm segment the distal aspect of the groin wound. There was some fibrinous exudate in the base of the wound which was sharply debrided being careful to avoid the vein graft. The lateral walls had healthy granulation tissue and were some granulation tissue in the base of the wound. The femoral artery itself was not exposed it was palpable. Following thorough irrigation and sharp debridement of the fibrinous material the wound was closed in one layer with 6-0 Prolene vertical mattress sutures. Wound came together fairly well with good apposition of the lateral walls and there was no change in Doppler flow down the vein graft with the wound closed. Sterile dressing was applied patient to recovery in stable condition  [name], MD 11/12/2012 8:19 AM

## 2012-11-12 NOTE — Preoperative (Signed)
Beta Blockers   Reason not to administer Beta Blockers:Not Applicable, Coreg last pm at 1730

## 2012-11-12 NOTE — H&P (View-Only) (Signed)
Patient ID: [name], male   DOB: [birth year], 78 y.o.   MRN: [id number] Vascular Surgery Progress Note  Subjective: no complaints per patient  Objective:  Filed Vitals:   11/11/12 0758  BP:   Pulse: 71  Temp:   Resp: 16    l groin wound examined Looks clean----lateral walls granulating Small amt of vein graft exposed 3+ graft pulse   Labs:  Recent Labs Lab 11/09/12 0446 11/10/12 0409 11/11/12 0417  CREATININE 2.17* 1.91* 2.00*    Recent Labs Lab 11/09/12 0446 11/10/12 0409 11/11/12 0417  NA 138 136 136  K 4.0 4.3 4.4  CL 106 103 102  CO2 24 23 25   BUN 34* 32* 33*  CREATININE 2.17* 1.91* 2.00*  GLUCOSE 123* 92 98  CALCIUM 8.7 9.3 9.2   No results found for this basename: WBC, HGB, HCT, PLT,  in the last 168 hours No results found for this basename: INR,  in the last 168 hours  I/O last 3 completed shifts: In: 1470 [P.O.:1320; IV Piggyback:150] Out: -   Imaging: No results found.  Assessment/Plan:    LOS: 21 days  s/p Procedure(s): EXPLORATION AND DEBRIDEMENT OF LEFT GROIN WOUND PATCH ANGIOPLASTY  Left groin wound is clean and very small amount of vein graft is exposed in the inferior aspect. Tissue seems pliable enough to close wound. Will take 2 OR tomorrow an attempt wound closure. Discusses with patient and he realizes that one may need to be reopened if infection reoccurs. No staph or strep pyogenes in most recent culture   [name], MD 11/11/2012 8:21 AM

## 2012-11-13 ENCOUNTER — Encounter (HOSPITAL_COMMUNITY): Payer: Self-pay | Admitting: Vascular Surgery

## 2012-11-13 LAB — GLUCOSE, CAPILLARY
Glucose-Capillary: 157 mg/dL — ABNORMAL HIGH (ref 70–99)
Glucose-Capillary: 135 mg/dL — ABNORMAL HIGH (ref 70–99)
Glucose-Capillary: 117 mg/dL — ABNORMAL HIGH (ref 70–99)

## 2012-11-13 LAB — BASIC METABOLIC PANEL
BUN: 39 mg/dL — ABNORMAL HIGH (ref 6–23)
Calcium: 9.2 mg/dL (ref 8.4–10.5)
Chloride: 106 mEq/L (ref 96–112)
Creatinine, Ser: 1.93 mg/dL — ABNORMAL HIGH (ref 0.50–1.35)
GFR calc Af Amer: 37 mL/min — ABNORMAL LOW (ref >90)
GFR calc non Af Amer: 32 mL/min — ABNORMAL LOW (ref >90)

## 2012-11-13 LAB — PROTIME-INR: Prothrombin Time: 14 seconds (ref 11.6–15.2)

## 2012-11-13 MED ORDER — ENSURE COMPLETE PO LIQD
237.0000 mL | ORAL | Status: DC
  Administered 2012-11-14 – 2012-11-16 (×2): 237 mL via ORAL

## 2012-11-13 NOTE — Progress Notes (Signed)
NUTRITION FOLLOW UP  Intervention:   1. Decrease Ensure to once daily, pt is eating 100% of meals.   Nutrition Dx:   Increased nutrient needs related to wounds as evidenced by estimated nutrition needs.   Goal:   Intake to meet >/=90% estimated nutrition needs. Met  Monitor:   Po intake, wound healing, weight trends, labs  Assessment:   S/P exploration and debridement of left groin wound on 8/20. S/p wound closure on 9/11. Continues with dressing changes. Planned for vac placement once able.  Pt eating 100% of meals at this time. Continues with Ensure supplements BID.   Height: Ht Readings from Last 1 Encounters:  10/21/12 5\' 8"  (1.727 m)    Weight Status:   Wt Readings from Last 1 Encounters:  10/22/12 186 lb 1.1 oz (84.4 kg)  no new weight since admission   Re-estimated needs:  Kcal: 2100-2300  Protein: 115-130 gm  Fluid: 2.1-2.3 L  Skin: groin wound  Diet Order: Carb Control   Intake/Output Summary (Last 24 hours) at 11/13/12 1202 Last data filed at 11/13/12 0921  Gross per 24 hour  Intake    600 ml  Output    300 ml  Net    300 ml    Last BM: 9/10   Labs:   Recent Labs Lab 11/11/12 0417 11/12/12 0600 11/13/12 0515  NA 136 135 139  K 4.4 4.1 3.8  CL 102 101 106  CO2 25 21 20   BUN 33* 37* 39*  CREATININE 2.00* 2.12* 1.93*  CALCIUM 9.2 9.2 9.2  GLUCOSE 98 100* 120*    CBG (last 3)   Recent Labs  11/13/12 0602 11/13/12 1005 11/13/12 1134  GLUCAP 115* 157* 135*    Scheduled Meds: . allopurinol  200 mg Oral Daily  . atorvastatin  10 mg Oral Daily  . carvedilol  6.25 mg Oral BID WC  . colchicine  0.6 mg Oral BID  . docusate sodium  100 mg Oral Daily  . DULoxetine  30 mg Oral Daily  . enoxaparin (LOVENOX) injection  40 mg Subcutaneous Q24H  . feeding supplement  237 mL Oral BID BM  . finasteride  5 mg Oral Daily  . furosemide  40 mg Oral Daily  . gabapentin  300 mg Oral TID  . insulin aspart  0-9 Units Subcutaneous TID WC  .  insulin aspart protamine- aspart  8 Units Subcutaneous BID WC  . losartan  100 mg Oral Daily  . multivitamin with minerals  1 tablet Oral Daily  . pantoprazole  40 mg Oral Daily  . vancomycin  750 mg Intravenous Q24H  . warfarin  2.5 mg Oral q1800  . Warfarin - Physician Dosing Inpatient   Does not apply q1800    Continuous Infusions:   Clarene Duke RD, LDN Pager [phone] After Hours pager [phone]

## 2012-11-13 NOTE — Progress Notes (Addendum)
  VASCULAR SURGERY BYPASS PROGRESS NOTE   1 Day Post-Op wound closure left groin  SUBJECTIVE: pt comfortable. With mod drainage, some purulent  PHYSICAL EXAM: BP Readings from Last 3 Encounters:  11/13/12 135/70  11/13/12 135/70  11/13/12 135/70   Temp Readings from Last 3 Encounters:  11/13/12 98.3 F (36.8 C) Oral  11/13/12 98.3 F (36.8 C) Oral  11/13/12 98.3 F (36.8 C) Oral   Pulse Readings from Last 3 Encounters:  11/13/12 71  11/13/12 71  11/13/12 71   SpO2 Readings from Last 3 Encounters:  11/13/12 100%  11/13/12 100%  11/13/12 100%     Intake/Output Summary (Last 24 hours) at 11/13/12 1046 Last data filed at 11/13/12 1610  Gross per 24 hour  Intake    600 ml  Output    300 ml  Net    300 ml    Extremities: Incisions draining seropurulent drainage 2 sutures removed and wound packed recultured Graft Pulse patent  LABS: Lab Results  Component Value Date   WBC 6.5 11/12/2012   HGB 12.1* 11/12/2012   HCT 37.8* 11/12/2012   MCV 82.5 11/12/2012   PLT 193 11/12/2012   Lab Results  Component Value Date   CREATININE 1.93* 11/13/2012   Lab Results  Component Value Date   INR 1.10 11/13/2012   ASSESSMENT: wound draining recultured Continue vancomycin - day 23  PLAN:pack wound TID with wet to dry The wound had a moderate amount of drainage today. This was recultured. 2 sutures removed from midportion and packed with moist saline gauze Continue daily observation and IV vancomycin and 3 times a day wound packing

## 2012-11-14 LAB — BASIC METABOLIC PANEL
CO2: 21 mEq/L (ref 19–32)
Chloride: 101 mEq/L (ref 96–112)
Creatinine, Ser: 2.03 mg/dL — ABNORMAL HIGH (ref 0.50–1.35)
Potassium: 3.8 mEq/L (ref 3.5–5.1)

## 2012-11-14 LAB — GLUCOSE, CAPILLARY
Glucose-Capillary: 101 mg/dL — ABNORMAL HIGH (ref 70–99)
Glucose-Capillary: 204 mg/dL — ABNORMAL HIGH (ref 70–99)
Glucose-Capillary: 93 mg/dL (ref 70–99)

## 2012-11-14 LAB — PROTIME-INR: Prothrombin Time: 13.7 seconds (ref 11.6–15.2)

## 2012-11-14 LAB — VANCOMYCIN, TROUGH: Vancomycin Tr: 16.8 ug/mL (ref 10.0–20.0)

## 2012-11-14 NOTE — Progress Notes (Signed)
Patient ID: [name], male   DOB: Mar 12, 1934, 78 y.o.   MRN: [id number] Vascular Surgery Progress Note  Subjective: Left groin wound continuing to be packed every 8 hours. 2 sutures of the wound closure were removed yesterday to facilitate packing. There has been minimal drainage on the external gauze. No chills and fever.  Objective:  Filed Vitals:   11/14/12 0951  BP: 100/66  Pulse:   Temp:   Resp:     General alert and oriented x3 3+ pulse in the graft laterally in the left lower extremit  Left inguinal wound examined. He looks much cleaner today. There is no pertinent drainage in the wound. Was was repacked with moist gauze. This remains clean and cultures are satisfactory may consider DC home first week with either wound packing or VAC Labs:  Recent Labs Lab 11/12/12 0600 11/13/12 0515 11/14/12 0545  CREATININE 2.12* 1.93* 2.03*    Recent Labs Lab 11/12/12 0600 11/13/12 0515 11/14/12 0545  NA 135 139 135  K 4.1 3.8 3.8  CL 101 106 101  CO2 21 20 21   BUN 37* 39* 40*  CREATININE 2.12* 1.93* 2.03*  GLUCOSE 100* 120* 99  CALCIUM 9.2 9.2 9.2    Recent Labs Lab 11/12/12 0600  WBC 6.5  HGB 12.1*  HCT 37.8*  PLT 193    Recent Labs Lab 11/12/12 1235 11/13/12 0515 11/14/12 0545  INR 1.07 1.10 1.07    I/O last 3 completed shifts: In: 600 [P.O.:600] Out: -   Imaging: No results found.  Assessment/Plan:   LOS: 24 days  s/p Procedure(s): CLOSURE INGUINAL WOUND   continued every 8 hour wound packing and check culture results tomorrow Continue vancomycin Possible DC home early next week if the wound remains clean   [name], MD 11/14/2012 11:17 AM

## 2012-11-14 NOTE — Progress Notes (Signed)
Changed dressing on left groin. Serous drainage on old dressing. Pt tolerated well. Packed groin with wet dressing and covered with dry dressing. Pt tolerated well.

## 2012-11-15 LAB — GLUCOSE, CAPILLARY
Glucose-Capillary: 226 mg/dL — ABNORMAL HIGH (ref 70–99)
Glucose-Capillary: 115 mg/dL — ABNORMAL HIGH (ref 70–99)

## 2012-11-15 LAB — BASIC METABOLIC PANEL
BUN: 38 mg/dL — ABNORMAL HIGH (ref 6–23)
CO2: 19 mEq/L (ref 19–32)
Chloride: 102 mEq/L (ref 96–112)
GFR calc non Af Amer: 35 mL/min — ABNORMAL LOW (ref >90)
Glucose, Bld: 98 mg/dL (ref 70–99)
Potassium: 4.3 mEq/L (ref 3.5–5.1)
Sodium: 135 mEq/L (ref 135–145)

## 2012-11-15 LAB — WOUND CULTURE

## 2012-11-15 LAB — PROTIME-INR: INR: 1.08 (ref 0.00–1.49)

## 2012-11-15 MED ORDER — WARFARIN SODIUM 5 MG PO TABS
5.0000 mg | ORAL_TABLET | Freq: Once | ORAL | Status: AC
  Administered 2012-11-15: 5 mg via ORAL
  Filled 2012-11-15: qty 1

## 2012-11-15 MED ORDER — WARFARIN - PHARMACIST DOSING INPATIENT
Freq: Every day | Status: DC
  Administered 2012-11-16 – 2012-11-17 (×2)

## 2012-11-15 NOTE — Progress Notes (Signed)
ANTICOAGULATION CONSULT NOTE - Initial Consult  Pharmacy Consult for coumadin  Indication: atrial fibrillation  Allergies  Allergen Reactions  . Other Other (See Comments)    Plastic tape tears skin    Patient Measurements: Height: 5\' 8"  (172.7 cm) Weight: 186 lb 1.1 oz (84.4 kg) IBW/kg (Calculated) : 68.4   Vital Signs: Temp: 98.4 F (36.9 C) (09/14 0559) Temp src: Oral (09/14 0559) BP: 129/64 mmHg (09/14 0559) Pulse Rate: 70 (09/14 0559)  Labs:  Recent Labs  11/13/12 0515 11/14/12 0545 11/15/12 0555  LABPROT 14.0 13.7 13.8  INR 1.10 1.07 1.08  CREATININE 1.93* 2.03* 1.80*    Estimated Creatinine Clearance: 36.9 ml/min (by C-G formula based on Cr of 1.8).   Medical History: Past Medical History  Diagnosis Date  . Hyperlipidemia   . Gout   . Benign prostatic hypertrophy   . Atrial fibrillation     sees Dr. Verdis Prime  . Chronic systolic dysfunction of left ventricle     a. mixed ischemic and nonischemic CM,  EF 35%. b. s/p AICD implantation.  . CHF (congestive heart failure)   . ED (erectile dysfunction)   . Arthritis   . Pacemaker     medtronic  . CAD (coronary artery disease)     a. s/p mid LAD stenting with DES 2008 Dr. Verdis Prime  . ICD (implantable cardiac defibrillator) in place     medtronic, Dr. Johney Frame  . ICD (implantable cardiac defibrillator) in place     due for check in Feb/2013  . Sleep apnea     hx of "had surgery for"  . Hypertension     sees Dr. Claris Che  . Type II diabetes mellitus   . Automatic implantable cardioverter-defibrillator in situ   . Depression   . Numbness and tingling     Hx; 41f left foot  . PAD (peripheral artery disease)     Severe by PV angiogram 09/2011  . Renal artery stenosis     s/p stenting 2009  . PAD (peripheral artery disease)     s/p multiple LLE bypass grafts; left mid-distal SCA occlusion by 08/2012 duplex    Medications:  Scheduled:  . allopurinol  200 mg Oral Daily  . atorvastatin  10 mg Oral  Daily  . carvedilol  6.25 mg Oral BID WC  . colchicine  0.6 mg Oral BID  . docusate sodium  100 mg Oral Daily  . DULoxetine  30 mg Oral Daily  . enoxaparin (LOVENOX) injection  40 mg Subcutaneous Q24H  . feeding supplement  237 mL Oral Q24H  . finasteride  5 mg Oral Daily  . furosemide  40 mg Oral Daily  . gabapentin  300 mg Oral TID  . insulin aspart  0-9 Units Subcutaneous TID WC  . insulin aspart protamine- aspart  8 Units Subcutaneous BID WC  . losartan  100 mg Oral Daily  . multivitamin with minerals  1 tablet Oral Daily  . pantoprazole  40 mg Oral Daily  . vancomycin  750 mg Intravenous Q24H  . warfarin  2.5 mg Oral q1800  . Warfarin - Physician Dosing Inpatient   Does not apply q1800    Assessment: 48 YOM admitted 8/20 for drainage from left inguinal area, fever, chills non-healing left inguinal wound s/p re-do left fem-anti tibial BPG. Hx of atrial fibrillation prior to admission. Patient on lovenox 40 mg subq q24h for bridging.  Home dose: 2.5 mg daily except no coumadin on Thursdays   INRs  for past 4 days subtherapeutic 1.07>1.10>1.07>1.08 on 2.5 mg daily. Pharmacy consulted to dose coumadin  No signs of bleeding noted. H/H 12.1/37.8  Goal of Therapy:  INR 2-3 Monitor platelets by anticoagulation protocol: Yes   Plan:  Coumadin 5mg  po x 1 tonight Daily INR Monitor for signs of bleeding  Thank you for allowing [place] to take part in this patient's care.  Tyrone Nine. Artelia Laroche, PharmD Clinical Pharmacist - Resident Pager: [phone] Phone: [phone] 11/15/2012 9:57 AM

## 2012-11-15 NOTE — Progress Notes (Addendum)
ANTIBIOTIC CONSULT NOTE - FOLLOW UP  Pharmacy Consult for Vancomycin Indication: MRSA wound infection  Allergies  Allergen Reactions  . Other Other (See Comments)    Plastic tape tears skin    Patient Measurements: Height: 5\' 8"  (172.7 cm) Weight: 186 lb 1.1 oz (84.4 kg) IBW/kg (Calculated) : 68.4   Vital Signs: Temp: 98.4 F (36.9 C) (09/14 0559) Temp src: Oral (09/14 0559) BP: 129/64 mmHg (09/14 0559) Pulse Rate: 70 (09/14 0559) Intake/Output from previous day: 09/13 0701 - 09/14 0700 In: 480 [P.O.:480] Out: -  Intake/Output from this shift:    Labs:  Recent Labs  11/13/12 0515 11/14/12 0545 11/15/12 0555  CREATININE 1.93* 2.03* 1.80*   Estimated Creatinine Clearance: 36.9 ml/min (by C-G formula based on Cr of 1.8).  Recent Labs  11/14/12 1918  VANCOTROUGH 16.8     Microbiology: Recent Results (from the past 720 hour(s))  ANAEROBIC CULTURE     Status: None   Collection Time    10/21/12 11:59 AM      Result Value Range Status   Specimen Description ABSCESS GROIN LEFT   Final   Special Requests PT ON ZINACEF   Final   Gram Stain     Final   Value: MODERATE WBC PRESENT, PREDOMINANTLY PMN     NO SQUAMOUS EPITHELIAL CELLS SEEN     FEW GRAM POSITIVE COCCI     IN PAIRS RARE GRAM NEGATIVE RODS     Performed at [facility]   Culture     Final   Value: NO ANAEROBES ISOLATED     Performed at [facility]   Report Status 10/26/2012 FINAL   Final  CULTURE, ROUTINE-ABSCESS     Status: None   Collection Time    10/21/12 11:59 AM      Result Value Range Status   Specimen Description ABSCESS GROIN LEFT   Final   Special Requests PT ON ZINACEF   Final   Gram Stain     Final   Value: MODERATE WBC PRESENT, PREDOMINANTLY PMN     NO SQUAMOUS EPITHELIAL CELLS SEEN     FEW GRAM POSITIVE COCCI     IN PAIRS RARE GRAM NEGATIVE RODS     Performed at [facility]   Culture     Final   Value: MODERATE METHICILLIN RESISTANT STAPHYLOCOCCUS  AUREUS     Note: RIFAMPIN AND GENTAMICIN SHOULD NOT BE USED AS SINGLE DRUGS FOR TREATMENT OF STAPH INFECTIONS. CRITICAL RESULT CALLED TO, READ BACK BY AND VERIFIED WITH: JAMIE YATES @ 11:19AM  10/24/12 BY DWEEKS     Performed at [facility]   Report Status 10/24/2012 FINAL   Final   Organism ID, Bacteria METHICILLIN RESISTANT STAPHYLOCOCCUS AUREUS   Final  CLOSTRIDIUM DIFFICILE BY PCR     Status: None   Collection Time    10/23/12  1:28 PM      Result Value Range Status   C difficile by pcr NEGATIVE  NEGATIVE Final  WOUND CULTURE     Status: None   Collection Time    11/09/12  8:45 AM      Result Value Range Status   Specimen Description WOUND GROIN LEFT   Final   Special Requests NONE   Final   Gram Stain     Final   Value: NO WBC SEEN     NO SQUAMOUS EPITHELIAL CELLS SEEN     MODERATE GRAM NEGATIVE RODS     Performed at [facility]   Culture     Final   Value: MULTIPLE ORGANISMS PRESENT, NONE PREDOMINANT     Note: NO STAPHYLOCOCCUS AUREUS ISOLATED NO GROUP A STREP (S.PYOGENES) ISOLATED     Performed at [facility]   Report Status 11/11/2012 FINAL   Final  WOUND CULTURE     Status: None   Collection Time    11/13/12 10:45 AM      Result Value Range Status   Specimen Description WOUND GROIN LEFT   Final   Special Requests PT ON VANCOMYCIN FOR MRSA   Final   Gram Stain     Final   Value: NO WBC SEEN     NO SQUAMOUS EPITHELIAL CELLS SEEN     NO ORGANISMS SEEN     Performed at [facility]   Culture     Final   Value: MULTIPLE ORGANISMS PRESENT, NONE PREDOMINANT NO STAPHYLOCOCCUS AUREUS ISOLATED NO GROUP A STREP (S.PYOGENES) ISOLATED     Performed at [facility]   Report Status 11/15/2012 FINAL   Final    Anti-infectives   Start     Dose/Rate Route Frequency Ordered Stop   11/12/12 0000  doxycycline (VIBRAMYCIN) 50 MG capsule  Status:  Discontinued     100 mg Oral 2 times daily 11/12/12 1142 11/12/12    11/12/12 0000  doxycycline  (VIBRA-TABS) 100 MG tablet     100 mg Oral 2 times daily 11/12/12 1143     10/29/12 1900  vancomycin (VANCOCIN) IVPB 750 mg/150 ml premix     750 mg 150 mL/hr over 60 Minutes Intravenous Every 24 hours 10/28/12 1943     10/25/12 1800  vancomycin (VANCOCIN) IVPB 1000 mg/200 mL premix  Status:  Discontinued     1,000 mg 200 mL/hr over 60 Minutes Intravenous Every 24 hours 10/24/12 1924 10/28/12 1943   10/21/12 1800  vancomycin (VANCOCIN) 1,250 mg in sodium chloride 0.9 % 250 mL IVPB  Status:  Discontinued     1,250 mg 166.7 mL/hr over 90 Minutes Intravenous Every 24 hours 10/21/12 1647 10/24/12 1924   10/21/12 1800  piperacillin-tazobactam (ZOSYN) IVPB 3.375 g  Status:  Discontinued     3.375 g 12.5 mL/hr over 240 Minutes Intravenous Every 8 hours 10/21/12 1647 10/21/12 1742   10/21/12 1800  piperacillin-tazobactam (ZOSYN) IVPB 3.375 g  Status:  Discontinued     3.375 g 12.5 mL/hr over 240 Minutes Intravenous Every 8 hours 10/21/12 1742 10/24/12 1131   10/21/12 0827  cefUROXime (ZINACEF) 1.5 g in dextrose 5 % 50 mL IVPB     1.5 g 100 mL/hr over 30 Minutes Intravenous 30 min pre-op 10/21/12 0827 10/21/12 1150      Assessment: [name] who has been on vancomycin since 8/20 for an MRSA wound and planned 4-6 week treatment course. He has been getting weekly troughs. A trough was entered for tonight (9/14), but the RN last evening entered one for 9/13. This resulted at 16.4mcg/mL. Since pharmacy did not enter this lab, it is not being addressed until now. Renal function is stable and trough drawn yesterday was therapeutic, so luckily no dose change is needed.  Goal of Therapy:  Vancomycin trough level 15-20 mcg/ml  Plan:  1. Continue vancomycin 750mg  IV Q24h 2. Continue weekly troughs entered by pharmacy is patient is still hospitalized. Will need to continue weekly troughs as an outpatient most likely as well 3. Follow for any drastic changes in renal function  Charlize Hathaway D. Khloie Hamada,  PharmD Clinical Pharmacist Pager: [phone] 11/15/2012 1:54 PM

## 2012-11-15 NOTE — Progress Notes (Signed)
Patient ID: [name], male   DOB: [birth year], 78 y.o.   MRN: [id number] Vascular Surgery Progress Note  Subjective: No specific complaints today. States left groin wound feels less tender on daily basis. No chills or fever.  Objective:  Filed Vitals:   11/15/12 0559  BP: 129/64  Pulse: 70  Temp: 98.4 F (36.9 C)  Resp: 18    General alert and oriented x3 in no apparent distress Left inguinal wound examined and repacked. Wound is cleaner with less drainage each day. One additional suture removed. There are 2 sutures in the inferior aspect which covers the vein graft which was exposed. Some fibrinous exudate invasive wound. No pertinent   Labs:  Recent Labs Lab 11/13/12 0515 11/14/12 0545 11/15/12 0555  CREATININE 1.93* 2.03* 1.80*    Recent Labs Lab 11/13/12 0515 11/14/12 0545 11/15/12 0555  NA 139 135 135  K 3.8 3.8 4.3  CL 106 101 102  CO2 20 21 19   BUN 39* 40* 38*  CREATININE 1.93* 2.03* 1.80*  GLUCOSE 120* 99 98  CALCIUM 9.2 9.2 9.1    Recent Labs Lab 11/12/12 0600  WBC 6.5  HGB 12.1*  HCT 37.8*  PLT 193    Recent Labs Lab 11/13/12 0515 11/14/12 0545 11/15/12 0555  INR 1.10 1.07 1.08    I/O last 3 completed shifts: In: 480 [P.O.:480] Out: -   Imaging: No results found.  Assessment/Plan:    LOS: 25 days  s/p Procedure(s): CLOSURE INGUINAL WOUND  Wound was repacked today with more saline gauze-being done every 8 hours Tomorrow will examine wound and have one service place wound VAC. We'll keep in hospital for 48 hours of active examine wound on Wednesday and discharged home with Monday Wednesday Friday wound VAC as outpatient if this is clean enough and examined on Wednesday   [name], MD 11/15/2012 8:51 AM

## 2012-11-16 LAB — BASIC METABOLIC PANEL
Chloride: 103 mEq/L (ref 96–112)
GFR calc Af Amer: 34 mL/min — ABNORMAL LOW (ref >90)
Potassium: 3.9 mEq/L (ref 3.5–5.1)
Sodium: 138 mEq/L (ref 135–145)

## 2012-11-16 LAB — PROTIME-INR: Prothrombin Time: 14.9 seconds (ref 11.6–15.2)

## 2012-11-16 LAB — GLUCOSE, CAPILLARY: Glucose-Capillary: 89 mg/dL (ref 70–99)

## 2012-11-16 MED ORDER — WARFARIN SODIUM 5 MG PO TABS
5.0000 mg | ORAL_TABLET | Freq: Once | ORAL | Status: AC
  Administered 2012-11-16: 5 mg via ORAL
  Filled 2012-11-16: qty 1

## 2012-11-16 NOTE — Progress Notes (Signed)
Patient ID: [name], male   DOB: 1934/04/01, 78 y.o.   MRN: [id number] Vascular Surgery Progress Note  Subjective: Continue wound packing left inguinal area. Patient reports less discomfort with each packing. Currently being packed every 8 hours.  Objective:  Filed Vitals:   11/16/12 0619  BP: 134/64  Pulse: 70  Temp: 98.2 F (36.8 C)  Resp: 18    General alert and oriented x3 no apparent stress Left inguinal wound repacked by me. Very minimal purulence on gauze. Granulation tissue lateral aspect of wound continues to improve.   Labs:  Recent Labs Lab 11/14/12 0545 11/15/12 0555 11/16/12 0440  CREATININE 2.03* 1.80* 2.07*    Recent Labs Lab 11/14/12 0545 11/15/12 0555 11/16/12 0440  NA 135 135 138  K 3.8 4.3 3.9  CL 101 102 103  CO2 21 19 22   BUN 40* 38* 39*  CREATININE 2.03* 1.80* 2.07*  GLUCOSE 99 98 86  CALCIUM 9.2 9.1 9.5    Recent Labs Lab 11/12/12 0600  WBC 6.5  HGB 12.1*  HCT 37.8*  PLT 193    Recent Labs Lab 11/14/12 0545 11/15/12 0555 11/16/12 0440  INR 1.07 1.08 1.20    I/O last 3 completed shifts: In: 720 [P.O.:720] Out: -   Imaging: No results found.  Assessment/Plan:    LOS: 26 days  s/p Procedure(s): CLOSURE INGUINAL WOUND  Cultures reviewed and reveal no evidence of staph or strep pyogenes at this time  Plan wound VAC application today and continue vancomycin until Wednesday. Will recheck wound Wednesday when [facility] is changed if it looks good we'll discharge home on oral Cipro   [name], MD 11/16/2012 9:00 AM

## 2012-11-16 NOTE — Consult Note (Signed)
WOC consult Note Reason for Consult: Initial application Wound VAC Left inguinal dehisced surgical site.  Wound type: surgical dehiscence Measurement: 6 cm x 4 cm x 4 cm depth Wound bed: 20% slough, 80 % red granulation tissue Drainage (amount, consistency, odor) Minimal serous drainage Periwound: Sutures at 3:00 and 9:00 Dressing procedure/placement/frequency: Site cleansed with NS and gently dried.  Skin prep applied to periwound.  Barrier rings placed over sutures for protection.   Wound packed with Versafoam and Black foam "mushroomed" on top. Trac Pad placed over black foam and suction applied at 125.  No seal indicated on machine and patient denies discomfort. WO[facility] nursing team will follow at this time. Discharge anticipated for Friday.   Maple Hudson RN BSN CWON Pager [phone]

## 2012-11-16 NOTE — Progress Notes (Signed)
ANTICOAGULATION CONSULT NOTE - Follow Up Consult  Pharmacy Consult for Coumadin Indication: atrial fibrillation  Allergies  Allergen Reactions  . Other Other (See Comments)    Plastic tape tears skin    Patient Measurements: Height: 5\' 8"  (172.7 cm) Weight: 186 lb 1.1 oz (84.4 kg) IBW/kg (Calculated) : 68.4  Vital Signs: Temp: 98.2 F (36.8 C) (09/15 0619) Temp src: Oral (09/15 0619) BP: 134/64 mmHg (09/15 0619) Pulse Rate: 70 (09/15 0619)  Labs:  Recent Labs  11/14/12 0545 11/15/12 0555 11/16/12 0440  LABPROT 13.7 13.8 14.9  INR 1.07 1.08 1.20  CREATININE 2.03* 1.80* 2.07*    Estimated Creatinine Clearance: 32.1 ml/min (by C-G formula based on Cr of 2.07).  Assessment: 78yom POD#4 closure of inguinal wound was resumed on coumadin 9/11 for afib. Dosing changed to per Rx on 9/14. Dose increased to 5mg  last night - INR remains subtherapeutic but starting to increase. No bleeding reported.  Home dose: 2.5mg  daily except none on Thursdays  Goal of Therapy:  INR 2-3 Monitor platelets by anticoagulation protocol: Yes   Plan:  1) Repeat coumadin 5mg  x 1 2) INR in AM  Fredrik Rigger 11/16/2012,8:46 AM

## 2012-11-16 NOTE — Progress Notes (Signed)
Patient ambulated 300 ft in hallway independently with family. Returned to room w/out incidence.[name]

## 2012-11-17 ENCOUNTER — Telehealth: Payer: Self-pay | Admitting: Vascular Surgery

## 2012-11-17 LAB — PROTIME-INR: INR: 1.44 (ref 0.00–1.49)

## 2012-11-17 LAB — BASIC METABOLIC PANEL
CO2: 23 mEq/L (ref 19–32)
Calcium: 9.4 mg/dL (ref 8.4–10.5)
Creatinine, Ser: 2.37 mg/dL — ABNORMAL HIGH (ref 0.50–1.35)
Glucose, Bld: 117 mg/dL — ABNORMAL HIGH (ref 70–99)
Sodium: 139 mEq/L (ref 135–145)

## 2012-11-17 LAB — GLUCOSE, CAPILLARY
Glucose-Capillary: 113 mg/dL — ABNORMAL HIGH (ref 70–99)
Glucose-Capillary: 136 mg/dL — ABNORMAL HIGH (ref 70–99)

## 2012-11-17 MED ORDER — ENOXAPARIN SODIUM 30 MG/0.3ML ~~LOC~~ SOLN
30.0000 mg | SUBCUTANEOUS | Status: DC
  Filled 2012-11-17 (×2): qty 0.3

## 2012-11-17 MED ORDER — WARFARIN SODIUM 5 MG PO TABS
5.0000 mg | ORAL_TABLET | Freq: Once | ORAL | Status: AC
  Administered 2012-11-17: 5 mg via ORAL
  Filled 2012-11-17: qty 1

## 2012-11-17 NOTE — Progress Notes (Signed)
Patient refused nighttime medications.  Patient's neurontin was not in his drawer so I had to notify for it.  He said since it was after 10 and late that he did not want his medicines.  He requested tylenol for a headache and was given it. Will continue to monitor.

## 2012-11-17 NOTE — Progress Notes (Signed)
ANTICOAGULATION CONSULT NOTE - Follow Up Consult  Pharmacy Consult for Coumadin Indication: atrial fibrillation  Allergies  Allergen Reactions  . Other Other (See Comments)    Plastic tape tears skin    Patient Measurements: Height: 5\' 8"  (172.7 cm) Weight: 186 lb 1.1 oz (84.4 kg) IBW/kg (Calculated) : 68.4  Vital Signs: Temp: 98.6 F (37 C) (09/16 0614) Temp src: Oral (09/16 0614) BP: 110/68 mmHg (09/16 0614) Pulse Rate: 72 (09/16 0614)  Labs:  Recent Labs  11/15/12 0555 11/16/12 0440 11/17/12 0535  LABPROT 13.8 14.9 17.2*  INR 1.08 1.20 1.44  CREATININE 1.80* 2.07* 2.37*    Estimated Creatinine Clearance: 28.1 ml/min (by C-G formula based on Cr of 2.37).  Assessment: 78yom POD#5 closure of inguinal wound was resumed on coumadin 9/11 for afib. Dosing changed to per Rx on 9/14. INR still subtherapeutic but starting to trend up with 5mg  doses. No CBC. No bleeding reported.  Home dose: 2.5mg  daily except none on Thursdays  Goal of Therapy:  INR 2-3 Monitor platelets by anticoagulation protocol: Yes   Plan:  1) Repeat coumadin 5mg  x 1 2) INR in AM  Fredrik Rigger 11/17/2012,8:23 AM

## 2012-11-17 NOTE — Progress Notes (Signed)
Patient ID: [name], male   DOB: March 19, 1934, 78 y.o.   MRN: [id number] Vascular Surgery Progress Note  Subjective: Left inguinal wound had a VAC placed yesterday. Patient reports minimal pain and has been ambulating without difficulty no chills and fever  Objective:  Filed Vitals:   11/17/12 0614  BP: 110/68  Pulse: 72  Temp: 98.6 F (37 C)  Resp: 18    General alert and oriented x3 Left inguinal wound with VAC in place Minimal serous drainage 3+ graft pulse   Labs:  Recent Labs Lab 11/15/12 0555 11/16/12 0440 11/17/12 0535  CREATININE 1.80* 2.07* 2.37*    Recent Labs Lab 11/15/12 0555 11/16/12 0440 11/17/12 0535  NA 135 138 139  K 4.3 3.9 4.4  CL 102 103 106  CO2 19 22 23   BUN 38* 39* 39*  CREATININE 1.80* 2.07* 2.37*  GLUCOSE 98 86 117*  CALCIUM 9.1 9.5 9.4    Recent Labs Lab 11/12/12 0600  WBC 6.5  HGB 12.1*  HCT 37.8*  PLT 193    Recent Labs Lab 11/15/12 0555 11/16/12 0440 11/17/12 0535  INR 1.08 1.20 1.44    I/O last 3 completed shifts: In: 360 [P.O.:360] Out: 0   Imaging: No results found.  Assessment/Plan:    LOS: 27 days  s/p Procedure(s): CLOSURE INGUINAL WOUND  Doing well thus far with vacuum left inguinal wound Will check wound tomorrow when [facility] is changed and discharge patient home Return on Monday for wound check in the office   [name], MD 11/17/2012 10:28 AM

## 2012-11-17 NOTE — Progress Notes (Signed)
Pt IV access lost. Pt did not receive all of IV vancomycin dose. Pt refusing new IV stating he is going home tomorrow on oral antibiotics. Dr. Myra Gianotti contacted. No new orders received.   Alwyn Ren, RN

## 2012-11-17 NOTE — Telephone Encounter (Addendum)
Message copied by Fredrich Birks on Tue Nov 17, 2012 12:21 PM ------      Message from: [name]      Created: Tue Nov 17, 2012 12:06 PM       Needs wound check this coming Monday - per Dr. Hart Rochester            Pt has wound vac ------  Received a call from [facility], and she said that she had just spoken with Dr Hart Rochester, and instead he would like to see him on Monday. He wants the patient here at 8:30am for wound check, which he will do prior to starting the laser procedure, dpm

## 2012-11-18 LAB — BASIC METABOLIC PANEL
BUN: 41 mg/dL — ABNORMAL HIGH (ref 6–23)
Calcium: 9.1 mg/dL (ref 8.4–10.5)
Creatinine, Ser: 2.24 mg/dL — ABNORMAL HIGH (ref 0.50–1.35)
GFR calc Af Amer: 31 mL/min — ABNORMAL LOW (ref >90)
GFR calc non Af Amer: 27 mL/min — ABNORMAL LOW (ref >90)
Potassium: 4.1 mEq/L (ref 3.5–5.1)

## 2012-11-18 NOTE — Discharge Summary (Signed)
[facility] Discharge Summary   Patient ID:  [name] MRN: [id number] DOB/AGE: 76-Jun-1936 76 y.o.  Admit date: 10/21/2012 Discharge date: 11/18/2012 Date of Surgery: 10/21/2012 - 11/12/2012 Surgeon: Surgeon(s): Pryor Ochoa, MD  Admission Diagnosis: Nonhealing Left Groin Wound INGUINAL WOUND NONHEALING  Discharge Diagnoses:  Nonhealing Left Groin Wound INGUINAL WOUND NONHEALING  Secondary Diagnoses: Past Medical History  Diagnosis Date  . Hyperlipidemia   . Gout   . Benign prostatic hypertrophy   . Atrial fibrillation     sees Dr. Verdis Prime  . Chronic systolic dysfunction of left ventricle     a. mixed ischemic and nonischemic CM,  EF 35%. b. s/p AICD implantation.  . CHF (congestive heart failure)   . ED (erectile dysfunction)   . Arthritis   . Pacemaker     medtronic  . CAD (coronary artery disease)     a. s/p mid LAD stenting with DES 2008 Dr. Verdis Prime  . ICD (implantable cardiac defibrillator) in place     medtronic, Dr. Johney Frame  . ICD (implantable cardiac defibrillator) in place     due for check in Feb/2013  . Sleep apnea     hx of "had surgery for"  . Hypertension     sees Dr. Claris Che  . Type II diabetes mellitus   . Automatic implantable cardioverter-defibrillator in situ   . Depression   . Numbness and tingling     Hx; 55f left foot  . PAD (peripheral artery disease)     Severe by PV angiogram 09/2011  . Renal artery stenosis     s/p stenting 2009  . PAD (peripheral artery disease)     s/p multiple LLE bypass grafts; left mid-distal SCA occlusion by 08/2012 duplex    Procedure(s):10/21/12 Exploration left inguinal wound with proximal control common femoral artery, insertion patch angioplasty using bovine pericardial patch, revision of left femoral to anterior tibial bypass with reattachment of the vein to bovine patch, debridement left groin wound with primary closure of the majority of wound 11/12/12 CLOSURE INGUINAL  WOUND  Discharged Condition: good  HPI:  [name] is a 78 y.o. male returns today for continued complaints of some drainage from the left inguinal area and intermittent chills and fever. He had a redo left femoral anterior tibial bypass with composite cephalic and basilic vein from left arm performed on July 30 by me. He previously had a femoral-popliteal Gore-Tex graft on July 16 which occluded almost immediately. His bypass this time has remained patent but he continues to have problems with the left inguinal wound. He is not on any anticoagulants at present time. He is on doxycycline which he is taking twice a day. Nonhealing of left inguinal wound 3 weeks post redo left femoral anterior tibial vein graft Pt admitted to the hospital for IV antibiotics and Will reexplore the left inguinal wound in the OR tomorrow-possibly leave open partially-possible VAC dressing    Hospital Course:  [name] is a 78 y.o. male is doing well s/p CLOSURE INGUINAL WOUND Extubated: POD # 0 Physical exam: left groin wound starting to granulate Palp graft pulse Post-op wounds healing well Pt. Ambulating, voiding and taking PO diet without difficulty. Pt pain controlled with PO pain meds. Labs as below Complications:none WOUND GROIN LEFT 11/13/12 culture and sensitivity   Special Requests  PT ON VANCOMYCIN FOR MRSA   Gram Stain  NO WBC SEEN NO SQUAMOUS EPITHELIAL CELLS SEEN NO ORGANISMS SEEN Performed at  [facility]  Culture  MULTIPLE ORGANISMS PRESENT, NONE PREDOMINANT NO STAPHYLOCOCCUS AUREUS ISOLATED NO GROUP A STREP (S.PYOGENES) ISOLATED Performed at [facility]    Consults:     Significant Diagnostic Studies: CBC Lab Results  Component Value Date   WBC 6.5 11/12/2012   HGB 12.1* 11/12/2012   HCT 37.8* 11/12/2012   MCV 82.5 11/12/2012   PLT 193 11/12/2012    BMET    Component Value Date/Time   NA 139 11/17/2012 0535   K 4.4 11/17/2012 0535   CL 106  11/17/2012 0535   CO2 23 11/17/2012 0535   GLUCOSE 117* 11/17/2012 0535   BUN 39* 11/17/2012 0535   CREATININE 2.37* 11/17/2012 0535   CREATININE 2.68* 09/07/2012 0812   CALCIUM 9.4 11/17/2012 0535   GFRNONAA 25* 11/17/2012 0535   GFRAA 29* 11/17/2012 0535   COAG Lab Results  Component Value Date   INR 1.44 11/17/2012   INR 1.20 11/16/2012   INR 1.08 11/15/2012     Disposition:  Discharge to :Home Discharge Orders   Future Appointments Provider Department Dept Phone   11/23/2012 8:30 AM Pryor Ochoa, MD [facility] [phone]   12/08/2012 10:30 AM Pryor Ochoa, MD [facility] [phone]   02/01/2013 2:30 PM Lesleigh Noe, MD [facility] [phone]   Future Orders Complete By Expires   Call MD for:  redness, tenderness, or signs of infection (pain, swelling, bleeding, redness, odor or green/yellow discharge around incision site)  As directed    Call MD for:  severe or increased pain, loss or decreased feeling  in affected limb(s)  As directed    Call MD for:  temperature >100.5  As directed    Discharge wound care:  As directed    Comments:     Dry dressing to left groin daily   Driving Restrictions  As directed    Comments:     No driving until after seen by Dr. Hart Rochester   Increase activity slowly  As directed    Comments:     Walk with assistance use walker or cane as needed   Lifting restrictions  As directed    Comments:     No lifting more than 5-10 lbs for 6 weeks   May shower   As directed    Resume previous diet  As directed        Medication List         allopurinol 100 MG tablet  Commonly known as:  ZYLOPRIM  Take 2 tablets (200 mg total) by mouth daily.     atorvastatin 10 MG tablet  Commonly known as:  LIPITOR  Take 1 tablet (10 mg total) by mouth daily.     carvedilol 6.25 MG tablet  Commonly known as:  COREG  Take 1 tablet (6.25 mg total) by mouth 2 (two) times daily with  a meal.     doxycycline 100 MG tablet  Commonly known as:  VIBRA-TABS  Take 1 tablet (100 mg total) by mouth 2 (two) times daily.     DULoxetine 30 MG capsule  Commonly known as:  CYMBALTA  Take 1 capsule (30 mg total) by mouth daily.     finasteride 5 MG tablet  Commonly known as:  PROSCAR  Take 5 mg by mouth daily.     furosemide 40 MG tablet  Commonly known as:  LASIX  Take 1 tablet (40 mg total) by mouth  daily.     gabapentin 300 MG capsule  Commonly known as:  NEURONTIN  Take 300 mg by mouth 3 (three) times daily.     glucose blood test strip  Use as instructed     glucose blood test strip  Commonly known as:  ONE TOUCH ULTRA TEST  Use to test blood sugar 2 times daily as instructed. Dx code: 250.73     insulin aspart protamine- aspart (70-30) 100 UNIT/ML injection  Commonly known as:  NOVOLOG MIX 70/30  Inject 10 Units into the skin 2 (two) times daily with a meal. 10 units with breakfast, and 10 units with the evening meal     losartan 100 MG tablet  Commonly known as:  COZAAR  Take 1 tablet (100 mg total) by mouth daily.     NITROSTAT 0.4 MG SL tablet  Generic drug:  nitroGLYCERIN  Place 0.4 mg under the tongue every 5 (five) minutes as needed for chest pain. For chest pain, max 3 doses     oxyCODONE-acetaminophen 5-325 MG per tablet  Commonly known as:  PERCOCET/ROXICET  Take 1-2 tablets by mouth every 4 (four) hours as needed.     oxyCODONE-acetaminophen 5-325 MG per tablet  Commonly known as:  PERCOCET/ROXICET  Take 1-2 tablets by mouth every 4 (four) hours as needed.     warfarin 2.5 MG tablet  Commonly known as:  COUMADIN  Take 1 tablet (2.5 mg total) by mouth See admin instructions. Take 2.5mg  daily except no coumadin on Thursdays       Verbal and written Discharge instructions given to the patient. Wound care per Discharge AVS     Follow-up Information   Follow up with [name], MD In 2 weeks. (office will arrange-sent)    Specialty:   Vascular Surgery   Contact information:   [address] [place] [address] [phone]       Follow up with [name], MD On 11/23/2012. (at 8:30am for wound check)    Specialty:  Vascular Surgery   Contact information:   [address] [place] [address] [phone]       Signed: [name] 11/18/2012, 8:10 AM

## 2012-11-18 NOTE — Consult Note (Signed)
WOC follow up Wound type: surgical left groin Measurement: 4.5cm x 3.0cm x 3.5cm  Wound bed:20% yellow slough, 80% granulation tissue Drainage (amount, consistency, odor) minimal in the canister Periwound:intact with sutures in place at the distal end of the wound, which has been covered by ostomy barrier ring to aid in the seal of the wound.  Dressing procedure/placement/frequency: 1 pc. white foam placed in the base of the wound, 1pc. Of black granufoam placed in the remainder of the wound, mushroom on top to protect the periwound from the [facility] pad.  Seal obtained at . Pt tolerated well, for DC today. Hooked to home NPWT VAC unit. Explained canister change and demonstrated electrical cord plug site for home use.  [facility] information in the room.    Discussed POC with patient and bedside nurse.  Re consult if needed, will not follow at this time. Thanks  Shantaya Bluestone [employer], CWOCN [phone])

## 2012-11-20 ENCOUNTER — Encounter: Payer: Self-pay | Admitting: Internal Medicine

## 2012-11-20 ENCOUNTER — Telehealth: Payer: Self-pay | Admitting: Family Medicine

## 2012-11-20 ENCOUNTER — Telehealth: Payer: Self-pay | Admitting: Internal Medicine

## 2012-11-20 ENCOUNTER — Ambulatory Visit (INDEPENDENT_AMBULATORY_CARE_PROVIDER_SITE_OTHER): Payer: Medicare Other | Admitting: General Practice

## 2012-11-20 ENCOUNTER — Encounter: Payer: Self-pay | Admitting: Vascular Surgery

## 2012-11-20 DIAGNOSIS — I4891 Unspecified atrial fibrillation: Secondary | ICD-10-CM

## 2012-11-20 NOTE — Telephone Encounter (Signed)
Called to report pt I & r PT    27.2 I & R   2.3 Currently taking 2.5 mg coum every day except thurs

## 2012-11-20 NOTE — Telephone Encounter (Signed)
Sent past due letter pt was due with allred in may for defib ck/mt

## 2012-11-23 ENCOUNTER — Encounter: Payer: Self-pay | Admitting: Vascular Surgery

## 2012-11-23 ENCOUNTER — Ambulatory Visit (INDEPENDENT_AMBULATORY_CARE_PROVIDER_SITE_OTHER): Payer: Medicare Other | Admitting: Vascular Surgery

## 2012-11-23 VITALS — BP 144/81 | HR 84 | Resp 16 | Ht 68.0 in | Wt 170.0 lb

## 2012-11-23 DIAGNOSIS — I739 Peripheral vascular disease, unspecified: Secondary | ICD-10-CM

## 2012-11-23 NOTE — Progress Notes (Signed)
Patient ID: [name], male   DOB: April 12, 1934, 78 y.o.   MRN: [id number] Mr. Tullis returns today for examination of the left inguinal wound. He had a VAC placed last Wednesday in the hospital. It is changed Monday Wednesday and Friday by home health. Today the [facility] was removed from the left inguinal wound. There was no per your months. The wound is continuing to granulate nicely. The vein graft is not exposed presently. There is a 3+ pulse in the vein graft laterally in the subcutaneous position and the edema has decreased in the left foot.  Improvement of left inguinal wound with home [facility] Continue Monday Wednesday Friday changes and I will see patient in office for wound check in 2 weeks Given prescription for oxycodone 10/325-#40 tablets for pain  BP 144/81  Pulse 84  Resp 16  Ht 5\' 8"  (1.727 m)  Wt 170 lb (77.111 kg)  BMI 25.85 kg/m2

## 2012-12-07 ENCOUNTER — Encounter: Payer: Self-pay | Admitting: Vascular Surgery

## 2012-12-08 ENCOUNTER — Encounter: Payer: Self-pay | Admitting: Vascular Surgery

## 2012-12-08 ENCOUNTER — Ambulatory Visit (INDEPENDENT_AMBULATORY_CARE_PROVIDER_SITE_OTHER): Payer: Medicare Other | Admitting: Vascular Surgery

## 2012-12-08 VITALS — BP 143/75 | HR 86 | Resp 16 | Ht 68.0 in | Wt 170.0 lb

## 2012-12-08 DIAGNOSIS — I739 Peripheral vascular disease, unspecified: Secondary | ICD-10-CM

## 2012-12-08 DIAGNOSIS — Z48812 Encounter for surgical aftercare following surgery on the circulatory system: Secondary | ICD-10-CM

## 2012-12-08 NOTE — Progress Notes (Signed)
Subjective:     Patient ID: SAYRE WITHERINGTON, male   DOB: Jan 28, 1935, 78 y.o.   MRN: [id number]  HPI this 78 year old male returns for continued followup regarding his left femoral to anterior tibial redo bypass using composite arm vein graft with nonhealing wound in the left inguinal area. He has been having VAC dressing changes at home Monday Wednesday and Friday. He has had no chills and fever.   Review of Systems     Objective:   Physical Exam BP 143/75  Pulse 86  Resp 16  Ht 5\' 8"  (1.727 m)  Wt 170 lb (77.111 kg)  BMI 25.85 kg/m2  General well-developed well-nourished male in no apparent stress alert and oriented x3 Left inguinal wound has healed nicely with granulation tissue on the lateral aspect about 2 cm in depth. The right aspect of the wound is well adherent and there is no deep space at this point. 3 sutures were removed.      Assessment:     Doing well post VAC treatment of left: Nonhealing wound    Plan:     #1 DC the VAC dressings #2 begin moist saline gauze twice a day daily #3 return in 2 months with duplex scan of left lower extremity bypass and ABI

## 2012-12-09 NOTE — Addendum Note (Signed)
Addended by: Adria Dill L on: 12/09/2012 02:39 PM   Modules accepted: Orders

## 2012-12-14 ENCOUNTER — Ambulatory Visit (INDEPENDENT_AMBULATORY_CARE_PROVIDER_SITE_OTHER): Payer: Medicare Other

## 2012-12-14 ENCOUNTER — Other Ambulatory Visit: Payer: Self-pay | Admitting: Family

## 2012-12-14 DIAGNOSIS — Z23 Encounter for immunization: Secondary | ICD-10-CM

## 2012-12-14 DIAGNOSIS — M79609 Pain in unspecified limb: Secondary | ICD-10-CM

## 2012-12-14 MED ORDER — OXYCODONE-ACETAMINOPHEN 5-325 MG PO TABS: 1.0000 | ORAL_TABLET | ORAL | Status: DC | PRN

## 2012-12-16 ENCOUNTER — Encounter (HOSPITAL_COMMUNITY): Payer: Medicare Other | Admitting: Anesthesiology

## 2012-12-16 ENCOUNTER — Inpatient Hospital Stay (HOSPITAL_COMMUNITY)
Admission: EM | Admit: 2012-12-16 | Discharge: 2012-12-17 | DRG: 253 | Disposition: A | Discharge status: Home / Self Care | Payer: Medicare Other | Attending: Vascular Surgery | Admitting: Vascular Surgery

## 2012-12-16 ENCOUNTER — Ambulatory Visit (INDEPENDENT_AMBULATORY_CARE_PROVIDER_SITE_OTHER)
Admission: RE | Admit: 2012-12-16 | Discharge: 2012-12-16 | Disposition: A | Discharge status: Home / Self Care | Payer: Medicare Other | Source: Ambulatory Visit | Attending: Vascular Surgery | Admitting: Vascular Surgery

## 2012-12-16 ENCOUNTER — Encounter (HOSPITAL_COMMUNITY): Payer: Self-pay | Admitting: Emergency Medicine

## 2012-12-16 ENCOUNTER — Encounter (HOSPITAL_COMMUNITY)
Admission: EM | Disposition: A | Discharge status: Home / Self Care | Payer: Self-pay | Source: Home / Self Care | Attending: Vascular Surgery

## 2012-12-16 ENCOUNTER — Encounter: Payer: Self-pay | Admitting: Internal Medicine

## 2012-12-16 ENCOUNTER — Other Ambulatory Visit: Payer: Self-pay

## 2012-12-16 ENCOUNTER — Ambulatory Visit (INDEPENDENT_AMBULATORY_CARE_PROVIDER_SITE_OTHER): Payer: Medicare Other | Admitting: Internal Medicine

## 2012-12-16 ENCOUNTER — Inpatient Hospital Stay (HOSPITAL_COMMUNITY): Payer: Medicare Other | Admitting: Anesthesiology

## 2012-12-16 VITALS — BP 141/70 | HR 81 | Ht 68.0 in | Wt 183.0 lb

## 2012-12-16 DIAGNOSIS — Z79899 Other long term (current) drug therapy: Secondary | ICD-10-CM

## 2012-12-16 DIAGNOSIS — I4891 Unspecified atrial fibrillation: Secondary | ICD-10-CM

## 2012-12-16 DIAGNOSIS — I5022 Chronic systolic (congestive) heart failure: Secondary | ICD-10-CM | POA: Diagnosis present

## 2012-12-16 DIAGNOSIS — I2510 Atherosclerotic heart disease of native coronary artery without angina pectoris: Secondary | ICD-10-CM | POA: Diagnosis present

## 2012-12-16 DIAGNOSIS — I743 Embolism and thrombosis of arteries of the lower extremities: Secondary | ICD-10-CM | POA: Diagnosis present

## 2012-12-16 DIAGNOSIS — E119 Type 2 diabetes mellitus without complications: Secondary | ICD-10-CM | POA: Diagnosis present

## 2012-12-16 DIAGNOSIS — I998 Other disorder of circulatory system: Secondary | ICD-10-CM

## 2012-12-16 DIAGNOSIS — I739 Peripheral vascular disease, unspecified: Secondary | ICD-10-CM

## 2012-12-16 DIAGNOSIS — N400 Enlarged prostate without lower urinary tract symptoms: Secondary | ICD-10-CM | POA: Diagnosis present

## 2012-12-16 DIAGNOSIS — Z95810 Presence of automatic (implantable) cardiac defibrillator: Secondary | ICD-10-CM

## 2012-12-16 DIAGNOSIS — E785 Hyperlipidemia, unspecified: Secondary | ICD-10-CM | POA: Diagnosis present

## 2012-12-16 DIAGNOSIS — Z789 Other specified health status: Secondary | ICD-10-CM

## 2012-12-16 DIAGNOSIS — T82898A Other specified complication of vascular prosthetic devices, implants and grafts, initial encounter: Secondary | ICD-10-CM

## 2012-12-16 DIAGNOSIS — I509 Heart failure, unspecified: Secondary | ICD-10-CM | POA: Diagnosis present

## 2012-12-16 DIAGNOSIS — Z9861 Coronary angioplasty status: Secondary | ICD-10-CM

## 2012-12-16 DIAGNOSIS — Z7901 Long term (current) use of anticoagulants: Secondary | ICD-10-CM

## 2012-12-16 DIAGNOSIS — R0989 Other specified symptoms and signs involving the circulatory and respiratory systems: Secondary | ICD-10-CM | POA: Insufficient documentation

## 2012-12-16 DIAGNOSIS — I428 Other cardiomyopathies: Secondary | ICD-10-CM | POA: Diagnosis present

## 2012-12-16 DIAGNOSIS — G4730 Sleep apnea, unspecified: Secondary | ICD-10-CM | POA: Diagnosis present

## 2012-12-16 DIAGNOSIS — I255 Ischemic cardiomyopathy: Secondary | ICD-10-CM

## 2012-12-16 DIAGNOSIS — Z794 Long term (current) use of insulin: Secondary | ICD-10-CM

## 2012-12-16 DIAGNOSIS — Y832 Surgical operation with anastomosis, bypass or graft as the cause of abnormal reaction of the patient, or of later complication, without mention of misadventure at the time of the procedure: Secondary | ICD-10-CM | POA: Diagnosis present

## 2012-12-16 DIAGNOSIS — I2589 Other forms of chronic ischemic heart disease: Secondary | ICD-10-CM

## 2012-12-16 DIAGNOSIS — I10 Essential (primary) hypertension: Secondary | ICD-10-CM | POA: Diagnosis present

## 2012-12-16 HISTORY — PX: EMBOLECTOMY: SHX44

## 2012-12-16 LAB — ICD DEVICE OBSERVATION
BATTERY VOLTAGE: 2.9165 V
LV LEAD THRESHOLD: 1 V
PACEART VT: 0
RV LEAD AMPLITUDE: 13.8 mv
RV LEAD THRESHOLD: 0.75 V
TZAT-0001ATACH: 3
TZAT-0002ATACH: NEGATIVE
TZAT-0002ATACH: NEGATIVE
TZAT-0002ATACH: NEGATIVE
TZAT-0004SLOWVT: 8
TZAT-0005SLOWVT: 88 pct
TZAT-0005SLOWVT: 91 pct
TZAT-0011SLOWVT: 10 ms
TZAT-0011SLOWVT: 10 ms
TZAT-0012FASTVT: 200 ms
TZAT-0012SLOWVT: 200 ms
TZAT-0012SLOWVT: 200 ms
TZAT-0018ATACH: NEGATIVE
TZAT-0018ATACH: NEGATIVE
TZAT-0019ATACH: 6 V
TZAT-0019FASTVT: 8 V
TZAT-0019SLOWVT: 8 V
TZAT-0020ATACH: 1.5 ms
TZAT-0020ATACH: 1.5 ms
TZAT-0020FASTVT: 1.5 ms
TZON-0003ATACH: 350 ms
TZON-0003SLOWVT: 320 ms
TZST-0001ATACH: 5
TZST-0001ATACH: 6
TZST-0001FASTVT: 2
TZST-0001FASTVT: 3
TZST-0001FASTVT: 5
TZST-0001SLOWVT: 4
TZST-0002ATACH: NEGATIVE
TZST-0002ATACH: NEGATIVE
TZST-0002ATACH: NEGATIVE
TZST-0002FASTVT: NEGATIVE
TZST-0002FASTVT: NEGATIVE
TZST-0002FASTVT: NEGATIVE
TZST-0003SLOWVT: 30 J
TZST-0003SLOWVT: 35 J
TZST-0003SLOWVT: 35 J
VENTRICULAR PACING ICD: 99.74 pct
VF: 0

## 2012-12-16 LAB — CBC
Hemoglobin: 12.3 g/dL — ABNORMAL LOW (ref 13.0–17.0)
MCHC: 33.6 g/dL (ref 30.0–36.0)
MCV: 81.7 fL (ref 78.0–100.0)
Platelets: 196 10*3/uL (ref 150–400)
RBC: 4.48 MIL/uL (ref 4.22–5.81)
RDW: 17 % — ABNORMAL HIGH (ref 11.5–15.5)
WBC: 8.4 10*3/uL (ref 4.0–10.5)

## 2012-12-16 LAB — COMPREHENSIVE METABOLIC PANEL
ALT: 16 U/L (ref 0–53)
AST: 17 U/L (ref 0–37)
BUN: 41 mg/dL — ABNORMAL HIGH (ref 6–23)
CO2: 23 mEq/L (ref 19–32)
Calcium: 9.3 mg/dL (ref 8.4–10.5)
Chloride: 100 mEq/L (ref 96–112)
Creatinine, Ser: 2.06 mg/dL — ABNORMAL HIGH (ref 0.50–1.35)
GFR calc Af Amer: 34 mL/min — ABNORMAL LOW (ref >90)
GFR calc non Af Amer: 30 mL/min — ABNORMAL LOW (ref >90)
Glucose, Bld: 88 mg/dL (ref 70–99)
Sodium: 137 mEq/L (ref 135–145)
Total Bilirubin: 0.7 mg/dL (ref 0.3–1.2)
Total Protein: 7.9 g/dL (ref 6.0–8.3)

## 2012-12-16 LAB — GLUCOSE, CAPILLARY
Glucose-Capillary: 78 mg/dL (ref 70–99)
Glucose-Capillary: 85 mg/dL (ref 70–99)
Glucose-Capillary: 89 mg/dL (ref 70–99)

## 2012-12-16 SURGERY — EMBOLECTOMY
Anesthesia: General | Site: Leg Lower | Laterality: Left | Wound class: Clean

## 2012-12-16 MED ORDER — DOCUSATE SODIUM 100 MG PO CAPS: 100.0000 mg | ORAL_CAPSULE | Freq: Every day | ORAL | Status: DC

## 2012-12-16 MED ORDER — FINASTERIDE 5 MG PO TABS
5.0000 mg | ORAL_TABLET | Freq: Every day | ORAL | Status: DC
  Filled 2012-12-16 (×2): qty 1

## 2012-12-16 MED ORDER — SODIUM CHLORIDE 0.9 % IV SOLN: 500.0000 mL | Freq: Once | INTRAVENOUS | Status: AC | PRN

## 2012-12-16 MED ORDER — WARFARIN SODIUM 2.5 MG PO TABS: 2.5000 mg | ORAL_TABLET | ORAL | Status: DC

## 2012-12-16 MED ORDER — HEPARIN SODIUM (PORCINE) 1000 UNIT/ML IJ SOLN
INTRAMUSCULAR | Status: DC | PRN
  Administered 2012-12-16: 8000 [IU] via INTRAVENOUS

## 2012-12-16 MED ORDER — DOPAMINE-DEXTROSE 3.2-5 MG/ML-% IV SOLN: 3.0000 ug/kg/min | INTRAVENOUS | Status: DC

## 2012-12-16 MED ORDER — SENNOSIDES-DOCUSATE SODIUM 8.6-50 MG PO TABS
1.0000 | ORAL_TABLET | Freq: Every evening | ORAL | Status: DC | PRN
  Filled 2012-12-16: qty 1

## 2012-12-16 MED ORDER — ALLOPURINOL 100 MG PO TABS
200.0000 mg | ORAL_TABLET | Freq: Every day | ORAL | Status: DC
  Filled 2012-12-16 (×2): qty 2

## 2012-12-16 MED ORDER — THROMBIN 20000 UNITS EX KIT
PACK | CUTANEOUS | Status: DC | PRN
  Administered 2012-12-16: 18:00:00 via TOPICAL

## 2012-12-16 MED ORDER — DOCUSATE SODIUM 100 MG PO CAPS: 100.0000 mg | ORAL_CAPSULE | Freq: Two times a day (BID) | ORAL | Status: DC

## 2012-12-16 MED ORDER — GUAIFENESIN-DM 100-10 MG/5ML PO SYRP: 15.0000 mL | ORAL_SOLUTION | ORAL | Status: DC | PRN

## 2012-12-16 MED ORDER — FENTANYL CITRATE 0.05 MG/ML IJ SOLN: 50.0000 ug | Freq: Once | INTRAMUSCULAR | Status: DC

## 2012-12-16 MED ORDER — PHENOL 1.4 % MT LIQD: 1.0000 | OROMUCOSAL | Status: DC | PRN

## 2012-12-16 MED ORDER — LIDOCAINE HCL (CARDIAC) 20 MG/ML IV SOLN
INTRAVENOUS | Status: DC | PRN
  Administered 2012-12-16: 80 mg via INTRAVENOUS

## 2012-12-16 MED ORDER — PANTOPRAZOLE SODIUM 40 MG PO TBEC: 40.0000 mg | DELAYED_RELEASE_TABLET | Freq: Every day | ORAL | Status: DC

## 2012-12-16 MED ORDER — LOSARTAN POTASSIUM 50 MG PO TABS
100.0000 mg | ORAL_TABLET | Freq: Every day | ORAL | Status: DC
  Filled 2012-12-16 (×2): qty 2

## 2012-12-16 MED ORDER — CEFAZOLIN SODIUM 1-5 GM-% IV SOLN
1.0000 g | INTRAVENOUS | Status: AC
  Administered 2012-12-16: 1 g via INTRAVENOUS
  Filled 2012-12-16: qty 50

## 2012-12-16 MED ORDER — ONDANSETRON HCL 4 MG/2ML IJ SOLN
INTRAMUSCULAR | Status: DC | PRN
  Administered 2012-12-16: 4 mg via INTRAMUSCULAR

## 2012-12-16 MED ORDER — METOPROLOL TARTRATE 1 MG/ML IV SOLN: 2.0000 mg | INTRAVENOUS | Status: DC | PRN

## 2012-12-16 MED ORDER — INSULIN ASPART PROT & ASPART (70-30 MIX) 100 UNIT/ML ~~LOC~~ SUSP
10.0000 [IU] | Freq: Two times a day (BID) | SUBCUTANEOUS | Status: DC
  Filled 2012-12-16: qty 10

## 2012-12-16 MED ORDER — ROCURONIUM BROMIDE 100 MG/10ML IV SOLN
INTRAVENOUS | Status: DC | PRN
  Administered 2012-12-16: 50 mg via INTRAVENOUS

## 2012-12-16 MED ORDER — LABETALOL HCL 5 MG/ML IV SOLN: 10.0000 mg | INTRAVENOUS | Status: DC | PRN

## 2012-12-16 MED ORDER — ENOXAPARIN SODIUM 30 MG/0.3ML ~~LOC~~ SOLN: 30.0000 mg | SUBCUTANEOUS | Status: DC

## 2012-12-16 MED ORDER — GLYCOPYRROLATE 0.2 MG/ML IJ SOLN
INTRAMUSCULAR | Status: DC | PRN
  Administered 2012-12-16: .6 mg via INTRAVENOUS

## 2012-12-16 MED ORDER — PROPOFOL 10 MG/ML IV BOLUS
INTRAVENOUS | Status: DC | PRN
  Administered 2012-12-16: 160 mg via INTRAVENOUS

## 2012-12-16 MED ORDER — OXYCODONE HCL 5 MG/5ML PO SOLN: 5.0000 mg | Freq: Once | ORAL | Status: DC | PRN

## 2012-12-16 MED ORDER — ACETAMINOPHEN 325 MG RE SUPP: 325.0000 mg | RECTAL | Status: DC | PRN

## 2012-12-16 MED ORDER — MAGNESIUM SULFATE 40 MG/ML IJ SOLN
2.0000 g | Freq: Once | INTRAMUSCULAR | Status: AC | PRN
  Filled 2012-12-16: qty 50

## 2012-12-16 MED ORDER — NEOSTIGMINE METHYLSULFATE 1 MG/ML IJ SOLN
INTRAMUSCULAR | Status: DC | PRN
  Administered 2012-12-16: 4 mg via INTRAVENOUS

## 2012-12-16 MED ORDER — THROMBIN 20000 UNITS EX SOLR
CUTANEOUS | Status: AC
  Filled 2012-12-16: qty 20000

## 2012-12-16 MED ORDER — MORPHINE SULFATE 2 MG/ML IJ SOLN: 2.0000 mg | INTRAMUSCULAR | Status: DC | PRN

## 2012-12-16 MED ORDER — BISACODYL 10 MG RE SUPP: 10.0000 mg | Freq: Every day | RECTAL | Status: DC | PRN

## 2012-12-16 MED ORDER — HYDRALAZINE HCL 20 MG/ML IJ SOLN: 10.0000 mg | INTRAMUSCULAR | Status: DC | PRN

## 2012-12-16 MED ORDER — HYDROMORPHONE HCL PF 1 MG/ML IJ SOLN: 0.2500 mg | INTRAMUSCULAR | Status: DC | PRN

## 2012-12-16 MED ORDER — ACETAMINOPHEN 325 MG PO TABS: 325.0000 mg | ORAL_TABLET | ORAL | Status: DC | PRN

## 2012-12-16 MED ORDER — ACETAMINOPHEN 650 MG RE SUPP: 325.0000 mg | RECTAL | Status: DC | PRN

## 2012-12-16 MED ORDER — POTASSIUM CHLORIDE CRYS ER 20 MEQ PO TBCR: 20.0000 meq | EXTENDED_RELEASE_TABLET | Freq: Once | ORAL | Status: AC | PRN

## 2012-12-16 MED ORDER — SODIUM CHLORIDE 0.9 % IV SOLN
INTRAVENOUS | Status: DC
  Administered 2012-12-16: 10 mL/h via INTRAVENOUS

## 2012-12-16 MED ORDER — INSULIN ASPART 100 UNIT/ML ~~LOC~~ SOLN
0.0000 [IU] | Freq: Three times a day (TID) | SUBCUTANEOUS | Status: DC
Start: 2012-12-16 — End: 2012-12-17
  Administered 2012-12-17: 2 [IU] via SUBCUTANEOUS

## 2012-12-16 MED ORDER — POTASSIUM CHLORIDE CRYS ER 20 MEQ PO TBCR: 20.0000 meq | EXTENDED_RELEASE_TABLET | Freq: Once | ORAL | Status: DC

## 2012-12-16 MED ORDER — LACTATED RINGERS IV SOLN
INTRAVENOUS | Status: DC | PRN
  Administered 2012-12-16: 17:00:00 via INTRAVENOUS

## 2012-12-16 MED ORDER — ONDANSETRON HCL 4 MG/2ML IJ SOLN: 4.0000 mg | Freq: Four times a day (QID) | INTRAMUSCULAR | Status: DC | PRN

## 2012-12-16 MED ORDER — SODIUM CHLORIDE 0.9 % IR SOLN
Status: DC | PRN
  Administered 2012-12-16: 16:00:00

## 2012-12-16 MED ORDER — NITROGLYCERIN 0.4 MG SL SUBL: 0.4000 mg | SUBLINGUAL_TABLET | SUBLINGUAL | Status: DC | PRN

## 2012-12-16 MED ORDER — DEXTROSE 5 % IV SOLN
1.5000 g | Freq: Two times a day (BID) | INTRAVENOUS | Status: DC
  Administered 2012-12-17: 1.5 g via INTRAVENOUS
  Filled 2012-12-16 (×2): qty 1.5

## 2012-12-16 MED ORDER — ATORVASTATIN CALCIUM 10 MG PO TABS
10.0000 mg | ORAL_TABLET | Freq: Every day | ORAL | Status: DC
  Filled 2012-12-16 (×2): qty 1

## 2012-12-16 MED ORDER — FENTANYL CITRATE 0.05 MG/ML IJ SOLN
INTRAMUSCULAR | Status: AC
  Administered 2012-12-16: 50 ug via INTRAVENOUS
  Filled 2012-12-16: qty 2

## 2012-12-16 MED ORDER — OXYCODONE HCL 5 MG PO TABS: 5.0000 mg | ORAL_TABLET | Freq: Once | ORAL | Status: DC | PRN

## 2012-12-16 MED ORDER — SODIUM CHLORIDE 0.9 % IV SOLN: INTRAVENOUS | Status: DC

## 2012-12-16 MED ORDER — GABAPENTIN 300 MG PO CAPS
300.0000 mg | ORAL_CAPSULE | Freq: Three times a day (TID) | ORAL | Status: DC
  Administered 2012-12-16: 300 mg via ORAL
  Filled 2012-12-16 (×4): qty 1

## 2012-12-16 MED ORDER — HEPARIN SODIUM (PORCINE) 5000 UNIT/ML IJ SOLN
5000.0000 [IU] | Freq: Three times a day (TID) | INTRAMUSCULAR | Status: DC
  Administered 2012-12-16: 5000 [IU] via SUBCUTANEOUS
  Filled 2012-12-16 (×5): qty 1

## 2012-12-16 MED ORDER — CARVEDILOL 6.25 MG PO TABS
6.2500 mg | ORAL_TABLET | Freq: Two times a day (BID) | ORAL | Status: DC
  Filled 2012-12-16 (×4): qty 1

## 2012-12-16 MED ORDER — FUROSEMIDE 40 MG PO TABS
40.0000 mg | ORAL_TABLET | Freq: Every day | ORAL | Status: DC
  Filled 2012-12-16: qty 1

## 2012-12-16 MED ORDER — ACETAMINOPHEN 325 MG PO TABS
325.0000 mg | ORAL_TABLET | ORAL | Status: DC | PRN
  Administered 2012-12-17: 650 mg via ORAL
  Filled 2012-12-16: qty 2

## 2012-12-16 MED ORDER — SENNOSIDES-DOCUSATE SODIUM 8.6-50 MG PO TABS: 1.0000 | ORAL_TABLET | Freq: Every evening | ORAL | Status: DC | PRN

## 2012-12-16 MED ORDER — FENTANYL CITRATE 0.05 MG/ML IJ SOLN
50.0000 ug | Freq: Once | INTRAMUSCULAR | Status: AC
  Administered 2012-12-16: 50 ug via INTRAVENOUS

## 2012-12-16 MED ORDER — OXYCODONE-ACETAMINOPHEN 5-325 MG PO TABS: 1.0000 | ORAL_TABLET | ORAL | Status: DC | PRN

## 2012-12-16 MED ORDER — MIDAZOLAM HCL 2 MG/2ML IJ SOLN: 1.0000 mg | INTRAMUSCULAR | Status: DC | PRN

## 2012-12-16 MED ORDER — FENTANYL CITRATE 0.05 MG/ML IJ SOLN
INTRAMUSCULAR | Status: DC | PRN
  Administered 2012-12-16: 100 ug via INTRAVENOUS
  Administered 2012-12-16: 50 ug via INTRAVENOUS

## 2012-12-16 MED ORDER — ALUM & MAG HYDROXIDE-SIMETH 200-200-20 MG/5ML PO SUSP: 15.0000 mL | ORAL | Status: DC | PRN

## 2012-12-16 SURGICAL SUPPLY — 58 items
ADH SKN CLS APL DERMABOND .7 (GAUZE/BANDAGES/DRESSINGS) ×1
BANDAGE ELASTIC 4 VELCRO ST LF (GAUZE/BANDAGES/DRESSINGS) ×1 IMPLANT
BANDAGE ESMARK 6X9 LF (GAUZE/BANDAGES/DRESSINGS) IMPLANT
BANDAGE GAUZE ELAST BULKY 4 IN (GAUZE/BANDAGES/DRESSINGS) ×1 IMPLANT
BLADE SURG ROTATE 9660 (MISCELLANEOUS) ×1 IMPLANT
BNDG CMPR 9X6 STRL LF SNTH (GAUZE/BANDAGES/DRESSINGS)
BNDG ESMARK 6X9 LF (GAUZE/BANDAGES/DRESSINGS)
CANISTER SUCTION 2500CC (MISCELLANEOUS) ×2 IMPLANT
CATH EMB 3FR 80CM (CATHETERS) ×2 IMPLANT
CATH EMB 4FR 80CM (CATHETERS) ×1 IMPLANT
CATH EMB 5FR 80CM (CATHETERS) IMPLANT
CLIP TI MEDIUM 24 (CLIP) ×2 IMPLANT
CLIP TI WIDE RED SMALL 24 (CLIP) ×2 IMPLANT
COVER SURGICAL LIGHT HANDLE (MISCELLANEOUS) ×2 IMPLANT
CUFF TOURNIQUET SINGLE 24IN (TOURNIQUET CUFF) IMPLANT
CUFF TOURNIQUET SINGLE 34IN LL (TOURNIQUET CUFF) IMPLANT
CUFF TOURNIQUET SINGLE 44IN (TOURNIQUET CUFF) IMPLANT
DECANTER SPIKE VIAL GLASS SM (MISCELLANEOUS) IMPLANT
DERMABOND ADVANCED (GAUZE/BANDAGES/DRESSINGS) ×1
DERMABOND ADVANCED .7 DNX12 (GAUZE/BANDAGES/DRESSINGS) IMPLANT
DRAIN SNY 10X20 3/4 PERF (WOUND CARE) IMPLANT
DRAPE WARM FLUID 44X44 (DRAPE) ×2 IMPLANT
DRAPE X-RAY CASS 24X20 (DRAPES) IMPLANT
DRSG ADAPTIC 3X8 NADH LF (GAUZE/BANDAGES/DRESSINGS) ×1 IMPLANT
DRSG COVADERM 4X8 (GAUZE/BANDAGES/DRESSINGS) IMPLANT
ELECT REM PT RETURN 9FT ADLT (ELECTROSURGICAL) ×2
ELECTRODE REM PT RTRN 9FT ADLT (ELECTROSURGICAL) ×1 IMPLANT
EVACUATOR SILICONE 100CC (DRAIN) IMPLANT
GLOVE BIO SURGEON STRL SZ 6.5 (GLOVE) ×1 IMPLANT
GLOVE BIO SURGEON STRL SZ7.5 (GLOVE) ×2 IMPLANT
GOWN PREVENTION PLUS XLARGE (GOWN DISPOSABLE) ×2 IMPLANT
GOWN STRL NON-REIN LRG LVL3 (GOWN DISPOSABLE) ×6 IMPLANT
KIT BASIN OR (CUSTOM PROCEDURE TRAY) ×2 IMPLANT
KIT ROOM TURNOVER OR (KITS) ×2 IMPLANT
NS IRRIG 1000ML POUR BTL (IV SOLUTION) ×4 IMPLANT
PACK PERIPHERAL VASCULAR (CUSTOM PROCEDURE TRAY) ×2 IMPLANT
PAD ARMBOARD 7.5X6 YLW CONV (MISCELLANEOUS) ×4 IMPLANT
PADDING CAST COTTON 6X4 STRL (CAST SUPPLIES) IMPLANT
SET COLLECT BLD 21X3/4 12 (NEEDLE) IMPLANT
SPONGE GAUZE 4X4 12PLY (GAUZE/BANDAGES/DRESSINGS) ×2 IMPLANT
SPONGE SURGIFOAM ABS GEL 100 (HEMOSTASIS) IMPLANT
STAPLER VISISTAT 35W (STAPLE) IMPLANT
STOPCOCK 4 WAY LG BORE MALE ST (IV SETS) IMPLANT
SUT ETHILON 3 0 PS 1 (SUTURE) ×4 IMPLANT
SUT PROLENE 5 0 C 1 24 (SUTURE) ×2 IMPLANT
SUT PROLENE 6 0 CC (SUTURE) ×2 IMPLANT
SUT PROLENE 7 0 BV1 MDA (SUTURE) ×1 IMPLANT
SUT VIC AB 2-0 CTX 36 (SUTURE) ×2 IMPLANT
SUT VIC AB 3-0 SH 27 (SUTURE) ×2
SUT VIC AB 3-0 SH 27X BRD (SUTURE) ×1 IMPLANT
SYR 3ML LL SCALE MARK (SYRINGE) ×2 IMPLANT
TAPE PAPER 3X10 WHT MICROPORE (GAUZE/BANDAGES/DRESSINGS) ×1 IMPLANT
TOWEL OR 17X24 6PK STRL BLUE (TOWEL DISPOSABLE) ×4 IMPLANT
TOWEL OR 17X26 10 PK STRL BLUE (TOWEL DISPOSABLE) ×4 IMPLANT
TRAY FOLEY CATH 16FRSI W/METER (SET/KITS/TRAYS/PACK) ×3 IMPLANT
TUBING EXTENTION W/L.L. (IV SETS) IMPLANT
UNDERPAD 30X30 INCONTINENT (UNDERPADS AND DIAPERS) ×2 IMPLANT
WATER STERILE IRR 1000ML POUR (IV SOLUTION) ×2 IMPLANT

## 2012-12-16 NOTE — Anesthesia Procedure Notes (Signed)
Procedure Name: Intubation Date/Time: 12/16/2012 5:12 PM Performed by: Sherren Kerns Pre-anesthesia Checklist: Timeout performed, Emergency Drugs available, Patient identified, Suction available and Patient being monitored Patient Re-evaluated:Patient Re-evaluated prior to inductionOxygen Delivery Method: Circle system utilized Preoxygenation: Pre-oxygenation with 100% oxygen Intubation Type: IV induction Ventilation: Mask ventilation without difficulty Laryngoscope Size: Mac and 3 Grade View: Grade I Tube type: Oral Tube size: 7.5 mm Number of attempts: 1 Airway Equipment and Method: Stylet Placement Confirmation: ETT inserted through vocal cords under direct vision,  positive ETCO2 and breath sounds checked- equal and bilateral Secured at: 21 cm Tube secured with: Tape Dental Injury: Teeth and Oropharynx as per pre-operative assessment

## 2012-12-16 NOTE — ED Notes (Signed)
Report received by Eileen Stanford RN

## 2012-12-16 NOTE — ED Provider Notes (Signed)
CSN: [id number]     Arrival date & time 12/16/12  1442 History   None    Chief Complaint  Patient presents with  . Circulatory Problem    HPI Pt reports increasing left leg pain over the past 2 days. Worsening pain today. Hx of left fib tib bypass graft. Now he feels no thrill in his graft. Sent from the vascular surgery office and seen by Dr Darrick Penna. Will be taken emergently to OR for thrombectomy. Mild to moderate pain at this time. No CP or SOB at this time. Compliant with his coumadin. No trauma.    Past Medical History  Diagnosis Date  . Hyperlipidemia   . Gout   . Benign prostatic hypertrophy   . Atrial fibrillation     sees Dr. Verdis Prime  . Chronic systolic dysfunction of left ventricle     a. mixed ischemic and nonischemic CM,  EF 35%. b. s/p AICD implantation.  . CHF (congestive heart failure)   . ED (erectile dysfunction)   . Arthritis   . Pacemaker     medtronic  . CAD (coronary artery disease)     a. s/p mid LAD stenting with DES 2008 Dr. Verdis Prime  . ICD (implantable cardiac defibrillator) in place     medtronic, Dr. Johney Frame  . ICD (implantable cardiac defibrillator) in place     due for check in Feb/2013  . Sleep apnea     hx of "had surgery for"  . Hypertension     sees Dr. Claris Che  . Type II diabetes mellitus   . Automatic implantable cardioverter-defibrillator in situ   . Depression   . Numbness and tingling     Hx; 82f left foot  . PAD (peripheral artery disease)     Severe by PV angiogram 09/2011  . Renal artery stenosis     s/p stenting 2009  . PAD (peripheral artery disease)     s/p multiple LLE bypass grafts; left mid-distal SCA occlusion by 08/2012 duplex   Past Surgical History  Procedure Laterality Date  . Turp vaporization    . Cardiac defibrillator placement  12/26/09    pacemaker combo  . Cervical epidural injection  2013  . Femoral-tibial bypass graft  09/25/2011    Procedure: BYPASS GRAFT FEMORAL-TIBIAL ARTERY;  Surgeon: Pryor Ochoa, MD;  Location: [facility] OR;  Service: Vascular;  Laterality: Left;  Left Femoral - Anterior Tibial Bypass;  saphenous vein graft from left leg  . Intraoperative arteriogram  09/25/2011    Procedure: INTRA OPERATIVE ARTERIOGRAM;  Surgeon: Pryor Ochoa, MD;  Location: [facility] OR;  Service: Vascular;  Laterality: Left;  . Femoral-tibial bypass graft  02/07/2012    Procedure: BYPASS GRAFT FEMORAL-TIBIAL ARTERY;  Surgeon: Larina Earthly, MD;  Location: [facility] OR;  Service: Vascular;  Laterality: Left;  Thrombectomy Left Femoral - Tibial Bypass Graft  . Embolectomy  02/07/2012    Procedure: EMBOLECTOMY;  Surgeon: Larina Earthly, MD;  Location: [facility] OR;  Service: Vascular;  Laterality: Left;  . Femoral-tibial bypass graft  04/03/2012    Procedure: BYPASS GRAFT FEMORAL-TIBIAL ARTERY;  Surgeon: Pryor Ochoa, MD;  Location: [facility] OR;  Service: Vascular;  Laterality: Left;  Redo  . Insert / replace / remove pacemaker  2007  . Coronary angioplasty with stent placement  ~ 2000  . Coronary angioplasty    . Uvulopalatopharyngoplasty (uppp)/tonsillectomy/septoplasty  06/30/2003    [name] 06/30/2003 (07/10/2012)  . Shoulder open rotator cuff repair Right 2001  repair of lacerated right/notes 10/11/1999  (07/10/2012)  . Foot surgery Right 03/20/2001    "have plates and screws in; didn't break it" (07/10/2012)  . Carpal tunnel release Right 2002    [name] 03/20/2001 (07/10/2012)  . Biceps tendon repair Right 2001    [name] 03/20/2001 (07/10/2012)  . Renal artery stent  2009  . Femoral-popliteal bypass graft Left 09/16/2012    Procedure: LEFT FEMORAL-POPLITEAL BYPASS GRAFT WITH GORTEX Propaten Graft 6x80 Thin Wall and Left lower leg Angiogram;  Surgeon: Pryor Ochoa, MD;  Location: [facility] OR;  Service: Vascular;  Laterality: Left;  . Colonoscopy      Hx; of  . Tonsillectomy    . Adenoidectomy      Hx; of   . Femoral-tibial bypass graft Left 09/30/2012    Procedure: REDO LEFT FEMORAL-ANTERIOR TIBIAL ARTERY BYPASS USING COMPOSITE CEPHALIC  AND BASILIC VEIN GRAFT FROM LEFT ARM;  Surgeon: Pryor Ochoa, MD;  Location: [facility] OR;  Service: Vascular;  Laterality: Left;  . I&d extremity Left 10/21/2012    Procedure: EXPLORATION AND DEBRIDEMENT OF LEFT GROIN WOUND;  Surgeon: Pryor Ochoa, MD;  Location: [facility] OR;  Service: Vascular;  Laterality: Left;  . Patch angioplasty Left 10/21/2012    Procedure: PATCH ANGIOPLASTY;  Surgeon: Pryor Ochoa, MD;  Location: [facility] OR;  Service: Vascular;  Laterality: Left;  . Groin debridement Left 11/12/2012    Procedure: CLOSURE INGUINAL WOUND;  Surgeon: Pryor Ochoa, MD;  Location: [facility] OR;  Service: Vascular;  Laterality: Left;   Family History  Problem Relation Age of Onset  . Cancer      breast/fhx  . Heart disease      fhx  . Diabetes Neg Hx   . Cancer Father    History  Substance Use Topics  . Smoking status: Never Smoker   . Smokeless tobacco: Never Used     Comment: 1-2 cigars when golfing  . Alcohol Use: 2.4 oz/week    2 Glasses of wine, 2 Shots of liquor per week     Comment: 07/10/2012 "galss of wine or vodka tonic 3 X/wk"    Review of Systems  All other systems reviewed and are negative.    Allergies  Other  Home Medications   Current Outpatient Rx  Name  Route  Sig  Dispense  Refill  . allopurinol (ZYLOPRIM) 100 MG tablet   Oral   Take 2 tablets (200 mg total) by mouth daily.   60 tablet   2     Call pt PCP for refills   . atorvastatin (LIPITOR) 10 MG tablet   Oral   Take 1 tablet (10 mg total) by mouth daily.   30 tablet   4   . carvedilol (COREG) 6.25 MG tablet   Oral   Take 1 tablet (6.25 mg total) by mouth 2 (two) times daily with a meal.   180 tablet   3   . finasteride (PROSCAR) 5 MG tablet   Oral   Take 5 mg by mouth daily.         . furosemide (LASIX) 40 MG tablet   Oral   Take 1 tablet (40 mg total) by mouth daily.   90 tablet   3   . gabapentin (NEURONTIN) 300 MG capsule   Oral   Take 300 mg by mouth 3 (three) times daily.         .  insulin aspart protamine-insulin aspart (NOVOLOG 70/30) (70-30) 100 UNIT/ML injection  Subcutaneous   Inject 10 Units into the skin 2 (two) times daily with a meal. 10 units with breakfast, and 10 units with the evening meal         . losartan (COZAAR) 100 MG tablet   Oral   Take 1 tablet (100 mg total) by mouth daily.   90 tablet   3   . NITROSTAT 0.4 MG SL tablet   Sublingual   Place 0.4 mg under the tongue every 5 (five) minutes as needed for chest pain. For chest pain, max 3 doses         . oxyCODONE-acetaminophen (PERCOCET/ROXICET) 5-325 MG per tablet   Oral   Take 1-2 tablets by mouth every 4 (four) hours as needed.   30 tablet   0   . warfarin (COUMADIN) 2.5 MG tablet   Oral   Take 1 tablet (2.5 mg total) by mouth See admin instructions. Take 2.5mg  daily except no coumadin on Thursdays          BP 141/63  Pulse 99  Temp(Src) 97.7 F (36.5 C)  Resp 20  Ht 5\' 8"  (1.727 m)  Wt 175 lb (79.379 kg)  BMI 26.61 kg/m2  SpO2 98% Physical Exam  Nursing note and vitals reviewed. Constitutional: He is oriented to person, place, and time. He appears well-developed and well-nourished.  HENT:  Head: Normocephalic and atraumatic.  Eyes: EOM are normal.  Neck: Normal range of motion.  Cardiovascular: Normal rate, regular rhythm, normal heart sounds and intact distal pulses.   Pulmonary/Chest: Effort normal and breath sounds normal. No respiratory distress.  Abdominal: Soft. He exhibits no distension. There is no tenderness.  Musculoskeletal: Normal range of motion.  Some coolness and discoloration of left lower extremity.  No thrill in his left thigh bypass graft  Neurological: He is alert and oriented to person, place, and time.  Skin: Skin is warm and dry.  Psychiatric: He has a normal mood and affect. Judgment normal.    ED Course  Procedures (including critical care time) Labs Review Labs Reviewed  PROTIME-INR  CBC  COMPREHENSIVE METABOLIC PANEL   Imaging  Review No results found.  ECG interpretation   Date: 12/16/2012  Rate: 71  Rhythm: Electronically paced rhythm     MDM   1. Ischemic leg     Patient be taken to the operating room emergently for left graft thrombectomy with current ischemia    Lyanne Co, MD 12/16/12 [phone]

## 2012-12-16 NOTE — Progress Notes (Signed)
PCP: Nelwyn Salisbury, MD Primary Cardiologist:  Dr Verdis Prime  [name] is a 78 y.o. male with a h/o miexed ischemic/ nonischemic CM, LBBB, NYHA Class III CHF sp BiV ICD (MDT) by Dr Amil Amen who presents today to for follow-up in the Electrophysiology device clinic. He is doing very well from a cardiac stand point but continues to struggle with vascular disease.  He has a left inguinal wound which is slowly healing.  He has been following closely with Dr Hart Rochester.  Today, he  denies symptoms of palpitations, chest pain, orthopnea, PND, dizziness, presyncope, syncope, or other neurologic sequela.   Past Medical History  Diagnosis Date  . Hyperlipidemia   . Gout   . Benign prostatic hypertrophy   . Atrial fibrillation     sees Dr. Verdis Prime  . Chronic systolic dysfunction of left ventricle     a. mixed ischemic and nonischemic CM,  EF 35%. b. s/p AICD implantation.  . CHF (congestive heart failure)   . ED (erectile dysfunction)   . Arthritis   . Pacemaker     medtronic  . CAD (coronary artery disease)     a. s/p mid LAD stenting with DES 2008 Dr. Verdis Prime  . ICD (implantable cardiac defibrillator) in place     medtronic, Dr. Johney Frame  . ICD (implantable cardiac defibrillator) in place     due for check in Feb/2013  . Sleep apnea     hx of "had surgery for"  . Hypertension     sees Dr. Claris Che  . Type II diabetes mellitus   . Automatic implantable cardioverter-defibrillator in situ   . Depression   . Numbness and tingling     Hx; 77f left foot  . PAD (peripheral artery disease)     Severe by PV angiogram 09/2011  . Renal artery stenosis     s/p stenting 2009  . PAD (peripheral artery disease)     s/p multiple LLE bypass grafts; left mid-distal SCA occlusion by 08/2012 duplex    Past Surgical History  Procedure Laterality Date  . Turp vaporization    . Cardiac defibrillator placement  12/26/09    pacemaker combo  . Cervical epidural injection  2013  . Femoral-tibial  bypass graft  09/25/2011    Procedure: BYPASS GRAFT FEMORAL-TIBIAL ARTERY;  Surgeon: Pryor Ochoa, MD;  Location: [facility] OR;  Service: Vascular;  Laterality: Left;  Left Femoral - Anterior Tibial Bypass;  saphenous vein graft from left leg  . Intraoperative arteriogram  09/25/2011    Procedure: INTRA OPERATIVE ARTERIOGRAM;  Surgeon: Pryor Ochoa, MD;  Location: [facility] OR;  Service: Vascular;  Laterality: Left;  . Femoral-tibial bypass graft  02/07/2012    Procedure: BYPASS GRAFT FEMORAL-TIBIAL ARTERY;  Surgeon: Larina Earthly, MD;  Location: [facility] OR;  Service: Vascular;  Laterality: Left;  Thrombectomy Left Femoral - Tibial Bypass Graft  . Embolectomy  02/07/2012    Procedure: EMBOLECTOMY;  Surgeon: Larina Earthly, MD;  Location: [facility] OR;  Service: Vascular;  Laterality: Left;  . Femoral-tibial bypass graft  04/03/2012    Procedure: BYPASS GRAFT FEMORAL-TIBIAL ARTERY;  Surgeon: Pryor Ochoa, MD;  Location: [facility] OR;  Service: Vascular;  Laterality: Left;  Redo  . Insert / replace / remove pacemaker  2007  . Coronary angioplasty with stent placement  ~ 2000  . Coronary angioplasty    . Uvulopalatopharyngoplasty (uppp)/tonsillectomy/septoplasty  06/30/2003    [name] 06/30/2003 (07/10/2012)  . Shoulder open rotator cuff repair  Right 2001    repair of lacerated right/notes 10/11/1999  (07/10/2012)  . Foot surgery Right 03/20/2001    "have plates and screws in; didn't break it" (07/10/2012)  . Carpal tunnel release Right 2002    [name] 03/20/2001 (07/10/2012)  . Biceps tendon repair Right 2001    [name] 03/20/2001 (07/10/2012)  . Renal artery stent  2009  . Femoral-popliteal bypass graft Left 09/16/2012    Procedure: LEFT FEMORAL-POPLITEAL BYPASS GRAFT WITH GORTEX Propaten Graft 6x80 Thin Wall and Left lower leg Angiogram;  Surgeon: Pryor Ochoa, MD;  Location: [facility] OR;  Service: Vascular;  Laterality: Left;  . Colonoscopy      Hx; of  . Tonsillectomy    . Adenoidectomy      Hx; of   . Femoral-tibial bypass graft Left  09/30/2012    Procedure: REDO LEFT FEMORAL-ANTERIOR TIBIAL ARTERY BYPASS USING COMPOSITE CEPHALIC AND BASILIC VEIN GRAFT FROM LEFT ARM;  Surgeon: Pryor Ochoa, MD;  Location: [facility] OR;  Service: Vascular;  Laterality: Left;  . I&d extremity Left 10/21/2012    Procedure: EXPLORATION AND DEBRIDEMENT OF LEFT GROIN WOUND;  Surgeon: Pryor Ochoa, MD;  Location: [facility] OR;  Service: Vascular;  Laterality: Left;  . Patch angioplasty Left 10/21/2012    Procedure: PATCH ANGIOPLASTY;  Surgeon: Pryor Ochoa, MD;  Location: [facility] OR;  Service: Vascular;  Laterality: Left;  . Groin debridement Left 11/12/2012    Procedure: CLOSURE INGUINAL WOUND;  Surgeon: Pryor Ochoa, MD;  Location: [facility] OR;  Service: Vascular;  Laterality: Left;    History   Social History  . Marital Status: Widowed    Spouse Name: N/A    Number of Children: N/A  . Years of Education: N/A   Occupational History  . Retired    Social History Main Topics  . Smoking status: Never Smoker   . Smokeless tobacco: Never Used     Comment: 1-2 cigars when golfing  . Alcohol Use: 2.4 oz/week    2 Glasses of wine, 2 Shots of liquor per week     Comment: 07/10/2012 "galss of wine or vodka tonic 3 X/wk"  . Drug Use: No  . Sexual Activity: Not Currently   Other Topics Concern  . Not on file   Social History Narrative   Lives in [place] with significant other,  [name].    Family History  Problem Relation Age of Onset  . Cancer      breast/fhx  . Heart disease      fhx  . Diabetes Neg Hx   . Cancer Father     Allergies  Allergen Reactions  . Other Other (See Comments)    Plastic tape tears skin    Current Outpatient Prescriptions  Medication Sig Dispense Refill  . allopurinol (ZYLOPRIM) 100 MG tablet Take 2 tablets (200 mg total) by mouth daily.  60 tablet  2  . atorvastatin (LIPITOR) 10 MG tablet Take 1 tablet (10 mg total) by mouth daily.  30 tablet  4  . carvedilol (COREG) 6.25 MG tablet Take 1 tablet (6.25 mg total) by  mouth 2 (two) times daily with a meal.  180 tablet  3  . finasteride (PROSCAR) 5 MG tablet Take 5 mg by mouth daily.      . furosemide (LASIX) 40 MG tablet Take 1 tablet (40 mg total) by mouth daily.  90 tablet  3  . gabapentin (NEURONTIN) 300 MG capsule Take 300 mg by mouth 3 (three) times daily.      Marland Kitchen  glucose blood (ONE TOUCH ULTRA TEST) test strip Use to test blood sugar 2 times daily as instructed. Dx code: 250.73  100 each  3  . insulin aspart protamine-insulin aspart (NOVOLOG 70/30) (70-30) 100 UNIT/ML injection Inject 10 Units into the skin 2 (two) times daily with a meal. 10 units with breakfast, and 10 units with the evening meal      . losartan (COZAAR) 100 MG tablet Take 1 tablet (100 mg total) by mouth daily.  90 tablet  3  . NITROSTAT 0.4 MG SL tablet Place 0.4 mg under the tongue every 5 (five) minutes as needed for chest pain. For chest pain, max 3 doses      . oxyCODONE-acetaminophen (PERCOCET/ROXICET) 5-325 MG per tablet Take 1-2 tablets by mouth every 4 (four) hours as needed.  30 tablet  0  . warfarin (COUMADIN) 2.5 MG tablet Take 1 tablet (2.5 mg total) by mouth See admin instructions. Take 2.5mg  daily except no coumadin on Thursdays       No current facility-administered medications for this visit.   Physical Exam: Filed Vitals:   12/16/12 0904  BP: 141/70  Pulse: 81  Height: 5\' 8"  (1.727 m)  Weight: 183 lb (83.008 kg)    GEN- The patient is well appearing, alert and oriented x 3 today.   Head- normocephalic, atraumatic Eyes-  Sclera clear, conjunctiva pink Ears- hearing intact Oropharynx- clear Neck- supple, no JVP Lymph- no cervical lymphadenopathy Lungs- Clear to ausculation bilaterally, normal work of breathing Chest- ICD pocket is well healed Heart- Regular rate and rhythm, no murmurs, rubs or gallops, PMI not laterally displaced GI- soft, NT, ND, + BS Extremities- no clubbing, cyanosis, or edema, venous insufficiency observed  ICD interrogation- reviewed  in detail today,  See PACEART report  Assessment and Plan:  1. Chronic systolic dysfunction Normal BiV ICD function See Pace Art report No changes today 99% BiV paced  2. Permanent afib Rate controlled Continue coumadin long term  [facility] Follow-up with Dr Katrinka Blazing as scheduled Return to see me in 1 year

## 2012-12-16 NOTE — Patient Instructions (Signed)
Your physician wants you to follow-up in: 12 months with Dr Jacquiline Doe will receive a reminder letter in the mail two months in advance. If you don't receive a letter, please call our office to schedule the follow-up appointment.   Remote monitoring is used to monitor your Pacemaker of ICD from home. This monitoring reduces the number of office visits required to check your device to one time per year. It allows [place] to keep an eye on the functioning of your device to ensure it is working properly. You are scheduled for a device check from home on 03/18/13. You may send your transmission at any time that day. If you have a wireless device, the transmission will be sent automatically. After your physician reviews your transmission, you will receive a postcard with your next transmission date.

## 2012-12-16 NOTE — Progress Notes (Signed)
Pt seen and examined He has a lateral graft pulse but foot is still cool and toes are dusky  Discussed with patient that I am not optimistic about long term patency of his bypass.  He has a diffusely sclerotic vein graft and with his current groin issues is not a candidate for PTFE bypass which would also be of limited durability.  Discussed with pt that if bypass occludes or if foot gets worse he would need amputation.  Fabienne Bruns, MD [facility] Office: [phone] Pager: [phone]

## 2012-12-16 NOTE — ED Notes (Signed)
Consulting MD at bedside

## 2012-12-16 NOTE — Progress Notes (Signed)
Pt. walked in to office and reported he cannot feel a pulse in the left leg; s/p redo (L) Fem-Ant Tib BPG with composite Cephalic and Basilic Vn. Graft of left arm 7//30/14.  C/o pain in left foot last night; stated with heat application pain subsided.  Denies any left lower extremity pain at this time.  States has a numb sensation on bottom of left foot and tips of left foot toes; explains that this is not a new symptom.  Called Dr. Darrick Penna to report pt's complaints.  Rec'd v.o. For left lower extrem art duplex of BPG, and ABI's.

## 2012-12-16 NOTE — Anesthesia Preprocedure Evaluation (Addendum)
Anesthesia Evaluation  Patient identified by MRN, date of birth, ID band Patient awake    Reviewed: Allergy & Precautions, H&P , NPO status , Patient's Chart, lab work & pertinent test results, reviewed documented beta blocker date and time   Airway Mallampati: I TM Distance: >3 FB Neck ROM: Full    Dental  (+) Teeth Intact and Dental Advisory Given   Pulmonary sleep apnea ,  breath sounds clear to auscultation        Cardiovascular hypertension, Pt. on medications and Pt. on home beta blockers + CAD, + Cardiac Stents, + Peripheral Vascular Disease and +CHF + dysrhythmias Atrial Fibrillation + pacemaker + Cardiac Defibrillator Rhythm:Irregular Rate:Normal  a. mixed ischemic and nonischemic CM,  EF 35%. b. s/p AICD implantation   Neuro/Psych Depression negative neurological ROS     GI/Hepatic negative GI ROS, Neg liver ROS,   Endo/Other  diabetes, Insulin Dependent  Renal/GU Renal disease     Musculoskeletal   Abdominal Normal abdominal exam  (+)   Peds  Hematology  (+) Blood dyscrasia, ,   Anesthesia Other Findings   Reproductive/Obstetrics                           Anesthesia Physical Anesthesia Plan  ASA: III and emergent  Anesthesia Plan: General   Post-op Pain Management:    Induction: Intravenous  Airway Management Planned: Oral ETT  Additional Equipment:   Intra-op Plan:   Post-operative Plan: Extubation in OR  Informed Consent: I have reviewed the patients History and Physical, chart, labs and discussed the procedure including the risks, benefits and alternatives for the proposed anesthesia with the patient or authorized representative who has indicated his/her understanding and acceptance.     Plan Discussed with: CRNA and Surgeon  Anesthesia Plan Comments:        Anesthesia Quick Evaluation

## 2012-12-16 NOTE — Preoperative (Signed)
Beta Blockers   Reason not to administer Beta Blockers:Not Applicable, took coreg today.

## 2012-12-16 NOTE — Op Note (Signed)
Procedure: Thrombectomy left femoral to anterior tibial artery bypass  Preoperative diagnosis: Ischemia left foot  Postoperative diagnosis: Same  Anesthesia: Gen.  Assistant: Lianne Cure PA-C  Findings: Diffusely narrowed vein graft accepting 1.5 to 2 mm dilator  Operative details: After obtaining informed consent, the patient was taken to the operating room. The patient was placed in supine position on the operating room table. After induction of general anesthesia and endotracheal ablation, the patient's entire left lower extremity was prepped and draped in usual sterile fashion. A Foley catheter was placed and removed at the end of the procedure. Next a longitudinal incision was made on the lateral aspect of the left calf over the area of the palpable vein graft. The incision was carried down through the subcutaneous tissues down to the level of the graft. The graft was sclerotic in appearance on its external surface. It was nearly a palpable cord. Several centimeters of the vein graft was dissected free with no change in caliber or character on palpation. The patient was given 10,000 units of intravenous heparin. A transverse graftotomy was made in the vein graft. There is no active bleeding. There was a very small lumen and diffuse intimal narrowing of the vein graft. A #3 Fogarty catheter was passed distally down the vein graft and I was able to get this to pass all the way to the level of the foot. Several passes were made retrieving thrombus. 2 clean passes were then obtained. There was some bright red backbleeding at this point. The vein graft was thoroughly flushed with heparinized saline. I was able to pass a 2 dilator into the distal lumen but it would not accept a 2.5 dilator. The vein graft was controlled distally with a fine bulldog clamp. Next the 3 Fogarty catheter was used to thrombectomize the proximal aspect of the graft. Multiple passes were made retrieving some dark thrombus. I  was able to get pulsatile inflow. However the proximal lumen of the graft was again quite small. 2 clean passes were obtained. This was controlled proximally with a fine bulldog clamp. Interrupted 7-0 Prolene sutures were then used to reapproximate the graftotomy. Clamps were released restoring flow to the leg. There is monophasic Doppler flow within the graft. The toes were still fairly dusky. The patient was not a candidate to have the bypass redundant you to recent infection in the groin. He also has no available vein conduit as his arm veins and leg veins are previously used. The bypass is diffusely narrowed with intimal thickening. Most likely this will reocclude in the near future. Hemostasis was obtained with thrombin Gelfoam. The skin edges were reapproximated using interrupted 3 0 vertical mattress nylon sutures. The patient tolerated the procedure well and there were no complications. Patient was awakened in the operating room taken to recovery in stable condition. Instrument sponge and needle count was correct at the end of the case. Prognosis for limb salvage is poor. If the graft reoccludes no further revisions should be attempted. Unless this is patent for several months and his groin is completely healed at which point a PTFE graft could be considered. However in light of the fact that this bypass graft has been revised on 7 prior occasions I do not believe long-term patency will be very durable.  Fabienne Bruns, MD [facility] [place] Office: [phone] Pager: [phone]

## 2012-12-16 NOTE — H&P (Signed)
[facility] [place] HISTORY AND PHYSICAL   History of Present Illness:  Patient is a 78 y.o. year old male who presents for evaluation of left foot pain.  He had a left femoral to anterior tibial bypass 7/13 by Dr Hart Rochester.  This was revised by Dr Arbie Cookey 12/13.  The bypass was redone 1/14, 09/16/12, 09/30/12 and 10/21/12.  Most recently he had complications with his groin wound and was in the hospital several days with a VAC.  The groin wound is slowly healing.  The patient noted increased pain in his left foot 2 days ago and then noticed his graft pulse was absent.  He was seen in our office today where a duplex showed the graft is occluded.  He is able to walk on the foot.  He has some baseline numbness but this is somewhat worse.  Other medical problems include hyperlipidemia, a fib (coumadin), coronary disease, cardiomyopathy (AICD), diabetes all of which are stable.  Past Medical History  Diagnosis Date  . Hyperlipidemia   . Gout   . Benign prostatic hypertrophy   . Atrial fibrillation     sees Dr. Verdis Prime  . Chronic systolic dysfunction of left ventricle     a. mixed ischemic and nonischemic CM,  EF 35%. b. s/p AICD implantation.  . CHF (congestive heart failure)   . ED (erectile dysfunction)   . Arthritis   . Pacemaker     medtronic  . CAD (coronary artery disease)     a. s/p mid LAD stenting with DES 2008 Dr. Verdis Prime  . ICD (implantable cardiac defibrillator) in place     medtronic, Dr. Johney Frame  . ICD (implantable cardiac defibrillator) in place     due for check in Feb/2013  . Sleep apnea     hx of "had surgery for"  . Hypertension     sees Dr. Claris Che  . Type II diabetes mellitus   . Automatic implantable cardioverter-defibrillator in situ   . Depression   . Numbness and tingling     Hx; 110f left foot  . PAD (peripheral artery disease)     Severe by PV angiogram 09/2011  . Renal artery stenosis     s/p stenting 2009  . PAD (peripheral artery  disease)     s/p multiple LLE bypass grafts; left mid-distal SCA occlusion by 08/2012 duplex    Past Surgical History  Procedure Laterality Date  . Turp vaporization    . Cardiac defibrillator placement  12/26/09    pacemaker combo  . Cervical epidural injection  2013  . Femoral-tibial bypass graft  09/25/2011    Procedure: BYPASS GRAFT FEMORAL-TIBIAL ARTERY;  Surgeon: Pryor Ochoa, MD;  Location: [facility] OR;  Service: Vascular;  Laterality: Left;  Left Femoral - Anterior Tibial Bypass;  saphenous vein graft from left leg  . Intraoperative arteriogram  09/25/2011    Procedure: INTRA OPERATIVE ARTERIOGRAM;  Surgeon: Pryor Ochoa, MD;  Location: [facility] OR;  Service: Vascular;  Laterality: Left;  . Femoral-tibial bypass graft  02/07/2012    Procedure: BYPASS GRAFT FEMORAL-TIBIAL ARTERY;  Surgeon: Larina Earthly, MD;  Location: [facility] OR;  Service: Vascular;  Laterality: Left;  Thrombectomy Left Femoral - Tibial Bypass Graft  . Embolectomy  02/07/2012    Procedure: EMBOLECTOMY;  Surgeon: Larina Earthly, MD;  Location: [facility] OR;  Service: Vascular;  Laterality: Left;  . Femoral-tibial bypass graft  04/03/2012    Procedure: BYPASS GRAFT FEMORAL-TIBIAL ARTERY;  Surgeon:  Pryor Ochoa, MD;  Location: [facility] OR;  Service: Vascular;  Laterality: Left;  Redo  . Insert / replace / remove pacemaker  2007  . Coronary angioplasty with stent placement  ~ 2000  . Coronary angioplasty    . Uvulopalatopharyngoplasty (uppp)/tonsillectomy/septoplasty  06/30/2003    [name] 06/30/2003 (07/10/2012)  . Shoulder open rotator cuff repair Right 2001    repair of lacerated right/notes 10/11/1999  (07/10/2012)  . Foot surgery Right 03/20/2001    "have plates and screws in; didn't break it" (07/10/2012)  . Carpal tunnel release Right 2002    [name] 03/20/2001 (07/10/2012)  . Biceps tendon repair Right 2001    [name] 03/20/2001 (07/10/2012)  . Renal artery stent  2009  . Femoral-popliteal bypass graft Left 09/16/2012    Procedure: LEFT FEMORAL-POPLITEAL  BYPASS GRAFT WITH GORTEX Propaten Graft 6x80 Thin Wall and Left lower leg Angiogram;  Surgeon: Pryor Ochoa, MD;  Location: [facility] OR;  Service: Vascular;  Laterality: Left;  . Colonoscopy      Hx; of  . Tonsillectomy    . Adenoidectomy      Hx; of   . Femoral-tibial bypass graft Left 09/30/2012    Procedure: REDO LEFT FEMORAL-ANTERIOR TIBIAL ARTERY BYPASS USING COMPOSITE CEPHALIC AND BASILIC VEIN GRAFT FROM LEFT ARM;  Surgeon: Pryor Ochoa, MD;  Location: [facility] OR;  Service: Vascular;  Laterality: Left;  . I&d extremity Left 10/21/2012    Procedure: EXPLORATION AND DEBRIDEMENT OF LEFT GROIN WOUND;  Surgeon: Pryor Ochoa, MD;  Location: [facility] OR;  Service: Vascular;  Laterality: Left;  . Patch angioplasty Left 10/21/2012    Procedure: PATCH ANGIOPLASTY;  Surgeon: Pryor Ochoa, MD;  Location: [facility] OR;  Service: Vascular;  Laterality: Left;  . Groin debridement Left 11/12/2012    Procedure: CLOSURE INGUINAL WOUND;  Surgeon: Pryor Ochoa, MD;  Location: [facility] OR;  Service: Vascular;  Laterality: Left;    Social History History  Substance Use Topics  . Smoking status: Never Smoker   . Smokeless tobacco: Never Used     Comment: 1-2 cigars when golfing  . Alcohol Use: 2.4 oz/week    2 Glasses of wine, 2 Shots of liquor per week     Comment: 07/10/2012 "galss of wine or vodka tonic 3 X/wk"    Family History Family History  Problem Relation Age of Onset  . Cancer      breast/fhx  . Heart disease      fhx  . Diabetes Neg Hx   . Cancer Father     Allergies  Allergies  Allergen Reactions  . Other Other (See Comments)    Plastic tape tears skin     Current Facility-Administered Medications  Medication Dose Route Frequency Provider Last Rate Last Dose  . 0.9 %  sodium chloride infusion   Intravenous Continuous Lars Mage, PA-C      . acetaminophen (TYLENOL) tablet 325-650 mg  325-650 mg Oral Q4H PRN Lars Mage, PA-C       Or  . acetaminophen (TYLENOL) suppository 325-650 mg   325-650 mg Rectal Q4H PRN Lars Mage, PA-C      . allopurinol (ZYLOPRIM) tablet 200 mg  200 mg Oral Daily Lars Mage, PA-C      . atorvastatin (LIPITOR) tablet 10 mg  10 mg Oral Daily Lars Mage, PA-C      . carvedilol (COREG) tablet 6.25 mg  6.25 mg Oral BID WC Lars Mage, PA-C      . [  START ON 12/17/2012] ceFAZolin (ANCEF) IVPB 1 g/50 mL premix  1 g Intravenous On Call Lars Mage, PA-C      . finasteride (PROSCAR) tablet 5 mg  5 mg Oral Daily Lars Mage, PA-C      . furosemide (LASIX) tablet 40 mg  40 mg Oral Daily Lars Mage, PA-C      . gabapentin (NEURONTIN) capsule 300 mg  300 mg Oral TID Lars Mage, PA-C      . insulin aspart (novoLOG) injection 0-15 Units  0-15 Units Subcutaneous TID WC Lars Mage, PA-C      . insulin aspart protamine- aspart (NOVOLOG MIX 70/30) injection 10 Units  10 Units Subcutaneous BID WC Lars Mage, PA-C      . losartan (COZAAR) tablet 100 mg  100 mg Oral Daily Lars Mage, PA-C      . morphine 2 MG/ML injection 2-5 mg  2-5 mg Intravenous Q1H PRN Lars Mage, PA-C      . nitroGLYCERIN (NITROSTAT) SL tablet 0.4 mg  0.4 mg Sublingual Q5 min PRN Lars Mage, PA-C      . warfarin (COUMADIN) tablet 2.5 mg  2.5 mg Oral See admin instructions Lars Mage, PA-C       Current Outpatient Prescriptions  Medication Sig Dispense Refill  . allopurinol (ZYLOPRIM) 100 MG tablet Take 2 tablets (200 mg total) by mouth daily.  60 tablet  2  . atorvastatin (LIPITOR) 10 MG tablet Take 1 tablet (10 mg total) by mouth daily.  30 tablet  4  . carvedilol (COREG) 6.25 MG tablet Take 1 tablet (6.25 mg total) by mouth 2 (two) times daily with a meal.  180 tablet  3  . finasteride (PROSCAR) 5 MG tablet Take 5 mg by mouth daily.      . furosemide (LASIX) 40 MG tablet Take 1 tablet (40 mg total) by mouth daily.  90 tablet  3  . gabapentin (NEURONTIN) 300 MG capsule Take 300 mg by mouth 3 (three) times daily.      Marland Kitchen glucose blood (ONE TOUCH  ULTRA TEST) test strip Use to test blood sugar 2 times daily as instructed. Dx code: 250.73  100 each  3  . insulin aspart protamine-insulin aspart (NOVOLOG 70/30) (70-30) 100 UNIT/ML injection Inject 10 Units into the skin 2 (two) times daily with a meal. 10 units with breakfast, and 10 units with the evening meal      . losartan (COZAAR) 100 MG tablet Take 1 tablet (100 mg total) by mouth daily.  90 tablet  3  . NITROSTAT 0.4 MG SL tablet Place 0.4 mg under the tongue every 5 (five) minutes as needed for chest pain. For chest pain, max 3 doses      . oxyCODONE-acetaminophen (PERCOCET/ROXICET) 5-325 MG per tablet Take 1-2 tablets by mouth every 4 (four) hours as needed.  30 tablet  0  . warfarin (COUMADIN) 2.5 MG tablet Take 1 tablet (2.5 mg total) by mouth See admin instructions. Take 2.5mg  daily except no coumadin on Thursdays        ROS:   General:  No weight loss, Fever, chills  HEENT: No recent headaches, no nasal bleeding, no visual changes, no sore throat  Neurologic: No dizziness, blackouts, seizures. No recent symptoms of stroke or mini- stroke. No recent episodes of slurred speech, or temporary blindness.  Cardiac: No recent episodes of chest pain/pressure, no shortness of breath at rest.  No shortness of  breath with exertion.  + history of atrial fibrillation or irregular heartbeat  Vascular: + history of rest pain in feet.  + history of claudication.  No history of non-healing ulcer, No history of DVT   Pulmonary: No home oxygen, no productive cough, no hemoptysis,  No asthma or wheezing  Musculoskeletal:  [ ]  Arthritis, [ ]  Low back pain,  [ ]  Joint pain  Hematologic:No history of hypercoagulable state.  No history of easy bleeding.  No history of anemia  Gastrointestinal: No hematochezia or melena,  No gastroesophageal reflux, no trouble swallowing  Urinary: [ ]  chronic Kidney disease, [ ]  on HD - [ ]  MWF or [ ]  TTHS, [ ]  Burning with urination, [ ]  Frequent urination, [ ]   Difficulty urinating;   Skin: No rashes  Psychological: No history of anxiety,  No history of depression   Physical Examination  Filed Vitals:   12/16/12 1451  BP: 141/63  Pulse: 99  Temp: 97.7 F (36.5 C)  Resp: 20  Height: 5\' 8"  (1.727 m)  Weight: 175 lb (79.379 kg)  SpO2: 98%    Body mass index is 26.61 kg/(m^2).  General:  Alert and oriented, no acute distress HEENT: Normal Pulmonary: Clear to auscultation bilaterally Cardiac: Regular Rate and Rhythm Abdomen: Soft, non-tender, non-distended, no mass Skin: No rash,left foot with 3 sec cap refill but slightly cooler and more pale than left  Extremity Pulses:  2+ radial, brachial, femoral,absent dorsalis pedis, posterior tibia pulse left leg, 2 cm opening left lateral groin with nylon sutures in place Musculoskeletal: No deformity or edema  Neurologic: Upper and lower extremity motor 5/5 and symmetric  DATA:     ASSESSMENT:  Occluded left fem AT bypass multiple prior revisions recent groin infection.  Discussion held with pt.  He is reluctant to abandon the bypass despite me informing him that he is not a candidate for redo currently due to groin problems and is highly likely to reocclude since this is the 7th operation on this bypass.  I informed him that we would do a simple thrombectomy on the graft but that if this reoccludes his only option would be an amputation of his leg and most likely this would be an above knee amputation.  He is in agreement that reocclusion will require an amputation.  Most likely his INR will be elevated but will proceed with thrombectomy as waiting will decreased chances of preserving runoff   PLAN:  Thrombectomy left leg bypass.  Amputation of leg is only option if this reoccludes.  Pt expressed understanding of this.  Fabienne Bruns, MD [facility] [place] Office: [phone] Pager: [phone]

## 2012-12-16 NOTE — ED Notes (Signed)
Per pt sts sent here by Dr. Darrick Penna for LLE problem. Pt has had bypass in leg before and now decreased blood flow.

## 2012-12-16 NOTE — OR Nursing (Signed)
Spoke with Marisue Humble PA with vasc re pulses, states should be able to palpate a pulse lat side of l knee ( WAS ABLE TO) HOWEVERHE STATES THERE ARE NSG ORDERS THAT IS THIS PUULSE IS NOT PALPABLE LATER NOT TO CALL AS THERE IS NO ACTION VASCULAR IS GOING TO TAKE

## 2012-12-16 NOTE — Transfer of Care (Signed)
Immediate Anesthesia Transfer of Care Note  Patient: [name]  Procedure(s) Performed: Procedure(s): THROMBECTOMY  LEFT LEG BYPASS (Left)  Patient Location: PACU  Anesthesia Type:General  Level of Consciousness: awake, alert , oriented and patient cooperative  Airway & Oxygen Therapy: Patient Spontanous Breathing and Patient connected to nasal cannula oxygen  Post-op Assessment: Report given to PACU RN, Post -op Vital signs reviewed and stable  Post vital signs: Reviewed and stable  Complications: No apparent anesthesia complications

## 2012-12-16 NOTE — ED Notes (Signed)
Received pt from fast track to prepare pt for OR. Dr Patria Mane to bedside. IV established consent signed per fast track nurse. . Family at bedside aware of plan of care. Or called for pt. Pt transfered

## 2012-12-17 ENCOUNTER — Telehealth: Payer: Self-pay | Admitting: Vascular Surgery

## 2012-12-17 LAB — BASIC METABOLIC PANEL
Calcium: 8.4 mg/dL (ref 8.4–10.5)
Creatinine, Ser: 2.13 mg/dL — ABNORMAL HIGH (ref 0.50–1.35)
GFR calc Af Amer: 33 mL/min — ABNORMAL LOW (ref >90)
GFR calc non Af Amer: 28 mL/min — ABNORMAL LOW (ref >90)
Glucose, Bld: 133 mg/dL — ABNORMAL HIGH (ref 70–99)
Potassium: 4.6 mEq/L (ref 3.5–5.1)
Sodium: 136 mEq/L (ref 135–145)

## 2012-12-17 LAB — GLUCOSE, CAPILLARY: Glucose-Capillary: 149 mg/dL — ABNORMAL HIGH (ref 70–99)

## 2012-12-17 LAB — CBC
MCH: 27.5 pg (ref 26.0–34.0)
MCHC: 33.9 g/dL (ref 30.0–36.0)
Platelets: 159 10*3/uL (ref 150–400)
RDW: 17 % — ABNORMAL HIGH (ref 11.5–15.5)

## 2012-12-17 MED ORDER — MORPHINE SULFATE 2 MG/ML IJ SOLN: 2.0000 mg | INTRAMUSCULAR | Status: DC | PRN

## 2012-12-17 MED ORDER — OXYCODONE-ACETAMINOPHEN 10-325 MG PO TABS: 1.0000 | ORAL_TABLET | ORAL | Status: DC | PRN

## 2012-12-17 MED ORDER — OXYCODONE-ACETAMINOPHEN 5-325 MG PO TABS
1.0000 | ORAL_TABLET | ORAL | Status: DC | PRN
  Administered 2012-12-17: 2 via ORAL
  Filled 2012-12-17: qty 2

## 2012-12-17 NOTE — Telephone Encounter (Addendum)
Message copied by Fredrich Birks on Thu Dec 17, 2012  2:11 PM ------      Message from: Phillips Odor      Created: Thu Dec 17, 2012 10:24 AM                   ----- Message -----         From: Lars Mage, PA-C         Sent: 12/17/2012   8:10 AM           To: Melene Plan, RN            F/U in the office in 1 week with Dr. Hart Rochester he had thrombectomy by Dr. Darrick Penna 12/16/2012 and has nylon sutures in the left lateral leg . ------  12/17/12: spoke with patient to confirm appt, dpm

## 2012-12-17 NOTE — Progress Notes (Signed)
Pt with some incisional pain but overall feels ok no real foot pain  On exam bypass has reoccluded.  No palpable graft pulse although some doppler flow.  Discussed with patient proceeding with amputation but he wants to think about it some. His pain is currently controlled with oral pain medication.  Will d/c home today with Percocet 10/325 disp 60.  Told patient that if his pain is not controlled with oral pain medication he needs amputation and the patient understands no further interventions for this bypass.  Fabienne Bruns, MD [facility] [place] Office: [phone] Pager: [phone]

## 2012-12-17 NOTE — Discharge Summary (Signed)
[facility] Discharge Summary   Patient ID:  [name] MRN: [id number] DOB/AGE: 1934-12-31 78 y.o.  Admit date: 12/16/2012 Discharge date: 12/17/2012 Date of Surgery: 12/16/2012 Surgeon: Surgeon(s): Sherren Kerns, MD  Admission Diagnosis: Ischemic leg [459.9]  Discharge Diagnoses:  Ischemic leg [459.9]  Secondary Diagnoses: Past Medical History  Diagnosis Date  . Hyperlipidemia   . Gout   . Benign prostatic hypertrophy   . Atrial fibrillation     sees Dr. Verdis Prime  . Chronic systolic dysfunction of left ventricle     a. mixed ischemic and nonischemic CM,  EF 35%. b. s/p AICD implantation.  . CHF (congestive heart failure)   . ED (erectile dysfunction)   . Arthritis   . Pacemaker     medtronic  . CAD (coronary artery disease)     a. s/p mid LAD stenting with DES 2008 Dr. Verdis Prime  . ICD (implantable cardiac defibrillator) in place     medtronic, Dr. Johney Frame  . ICD (implantable cardiac defibrillator) in place     due for check in Feb/2013  . Sleep apnea     hx of "had surgery for"  . Hypertension     sees Dr. Claris Che  . Type II diabetes mellitus   . Automatic implantable cardioverter-defibrillator in situ   . Depression   . Numbness and tingling     Hx; 70f left foot  . PAD (peripheral artery disease)     Severe by PV angiogram 09/2011  . Renal artery stenosis     s/p stenting 2009  . PAD (peripheral artery disease)     s/p multiple LLE bypass grafts; left mid-distal SCA occlusion by 08/2012 duplex    Procedure(s): THROMBECTOMY  LEFT LEG BYPASS  Discharged Condition: stable  HPI: Patient is a 78 y.o. year old male who presents for evaluation of left foot pain. He had a left femoral to anterior tibial bypass 7/13 by Dr Hart Rochester. This was revised by Dr Arbie Cookey 12/13. The bypass was redone 1/14, 09/16/12, 09/30/12 and 10/21/12. Most recently he had complications with his groin wound and was in the hospital several days with a VAC. The  groin wound is slowly healing. The patient noted increased pain in his left foot 2 days ago and then noticed his graft pulse was absent. He was seen in our office today where a duplex showed the graft is occluded. He is able to walk on the foot. He has some baseline numbness but this is somewhat worse. Other medical problems include hyperlipidemia, a fib (coumadin), coronary disease, cardiomyopathy (AICD), diabetes all of which are stable.  He under went thrombectomy left femoral to anterior tibial artery bypass.  Diffusely narrowed vein graft accepting 1.5 to 2 mm dilator.  He had a palpable graft pulse post-op.  This am the graft was not palpable.  Faint AT doppler signal was heard. Active range of motion of left foot and toes were intact.  He will be discharged home.  He was given PO pain medication Percocet 10/325 disp 60.  He understands no further interventions for this bypass.  His next option will be amputation BKA verses AKA.   Hospital Course:  [name] is a 78 y.o. male is S/P Left Procedure(s): THROMBECTOMY  LEFT LEG BYPASS Extubated: POD # 0 Physical exam: Faint doppler AT signal at bed side  Lateral incision clean and dry without drainage  Forefoot cool to touch lower leg is warm  No palpable pulse in  graft lateral left leg Pt. Ambulating, voiding and taking PO diet without difficulty. Pt pain controlled with PO pain meds. Labs as below Complications:see hospital course  Consults:     Significant Diagnostic Studies: CBC Lab Results  Component Value Date   WBC 5.8 12/17/2012   HGB 10.5* 12/17/2012   HCT 31.0* 12/17/2012   MCV 81.2 12/17/2012   PLT 159 12/17/2012    BMET    Component Value Date/Time   NA 136 12/17/2012 0508   K 4.6 12/17/2012 0508   CL 102 12/17/2012 0508   CO2 22 12/17/2012 0508   GLUCOSE 133* 12/17/2012 0508   BUN 40* 12/17/2012 0508   CREATININE 2.13* 12/17/2012 0508   CREATININE 2.68* 09/07/2012 0812   CALCIUM 8.4 12/17/2012 0508    GFRNONAA 28* 12/17/2012 0508   GFRAA 33* 12/17/2012 0508   COAG Lab Results  Component Value Date   INR 1.83* 12/16/2012   INR 2.3 11/20/2012   INR 1.61* 11/18/2012     Disposition:  Discharge to :Home Discharge Orders   Future Appointments Provider Department Dept Phone   02/01/2013 2:30 PM Lesleigh Noe, MD [facility] [phone]   02/09/2013 3:00 PM [facility] [place] [facility] [address] [phone]   02/09/2013 3:30 PM [facility] [place] [facility] [address] [phone]   02/09/2013 4:00 PM Pryor Ochoa, MD [facility] [phone]   03/18/2013 8:35 AM [facility] [phone]   Future Orders Complete By Expires   Call MD for:  redness, tenderness, or signs of infection (pain, swelling, bleeding, redness, odor or green/yellow discharge around incision site)  As directed    Call MD for:  severe or increased pain, loss or decreased feeling  in affected limb(s)  As directed    Call MD for:  temperature >100.5  As directed    Increase activity slowly  As directed    Comments:     Walk with assistance use walker or cane as needed   May shower   As directed    Scheduling Instructions:     May shower tomorrow.  Continue wound care to groin.   Resume previous diet  As directed        Medication List         allopurinol 100 MG tablet  Commonly known as:  ZYLOPRIM  Take 100 mg by mouth daily.     atorvastatin 10 MG tablet  Commonly known as:  LIPITOR  Take 1 tablet (10 mg total) by mouth daily.     carvedilol 6.25 MG tablet  Commonly known as:  COREG  Take 1 tablet (6.25 mg total) by mouth 2 (two) times daily with a meal.     finasteride 5 MG tablet  Commonly known as:  PROSCAR  Take 5 mg by mouth daily.     furosemide 40 MG tablet  Commonly known as:  LASIX  Take 1 tablet (40 mg total) by mouth daily.     gabapentin 300 MG capsule  Commonly  known as:  NEURONTIN  Take 300-600 mg by mouth 2 (two) times daily. Takes 300 mg (1 capsule) in the morning and 600 mg (2 capsules) at night     insulin aspart protamine- aspart (70-30) 100 UNIT/ML injection  Commonly known as:  NOVOLOG MIX 70/30  Inject 10 Units into the skin 2 (two) times daily with a meal. 10 units with breakfast, and 10 units  with the evening meal     losartan 100 MG tablet  Commonly known as:  COZAAR  Take 1 tablet (100 mg total) by mouth daily.     NITROSTAT 0.4 MG SL tablet  Generic drug:  nitroGLYCERIN  Place 0.4 mg under the tongue every 5 (five) minutes as needed for chest pain. For chest pain, max 3 doses     oxyCODONE-acetaminophen 5-325 MG per tablet  Commonly known as:  PERCOCET/ROXICET  Take 1-2 tablets by mouth every 4 (four) hours as needed.     oxyCODONE-acetaminophen 10-325 MG per tablet  Commonly known as:  PERCOCET  Take 1-2 tablets by mouth every 4 (four) hours as needed for pain.     warfarin 5 MG tablet  Commonly known as:  COUMADIN  Take 0-2.5 mg by mouth daily. Takes 2.5 mg daily EXCEPT for none on Thursday       Verbal and written Discharge instructions given to the patient. Wound care per Discharge AVS   Signed: Clinton Gallant [facility] 12/17/2012, 7:59 AM

## 2012-12-17 NOTE — Progress Notes (Signed)
Pt performed his own moist to dry dressing change to left groin. Pt states that he changes his dressing two times daily.  [place], [place], Charity fundraiser

## 2012-12-17 NOTE — Progress Notes (Signed)
Pt. Given discharge instructions, questions answered. F/U appts on DC instructions. Rx sent home with patient. All belongings sent home, glasses, phone, clothing. Pt. Filled out release of records form, sent to medical records. IV DC, hemostasis achieved. VSS. Pt. Escorted to private vehicle via wheelchair by NT.

## 2012-12-17 NOTE — Progress Notes (Signed)
[facility] [place]  Subjective  - I can wiggle my toes and I couldn't do that yesterday.   Objective 129/36 70 99.2 F (37.3 C) (Oral) 18 96%  Intake/Output Summary (Last 24 hours) at 12/17/12 0749 Last data filed at 12/17/12 0408  Gross per 24 hour  Intake 1240.17 ml  Output    380 ml  Net 860.17 ml    Faint doppler AT signal at bed side Lateral incision clean and dry without drainage Forefoot cool to touch lower leg is warm No palpable pulse in graft lateral left leg  Assessment/Planning: Thrombectomy left femoral to anterior tibial artery bypass Pain well controlled will discharge with PO pain medication Percocet 10/325 disp 60 The patient understands no further interventions for this bypass. Discharge home f/u in 1 week with Dr. Hart Rochester for wound check   Thomasena Edis [facility] 12/17/2012 7:49 AM --  Laboratory Lab Results:  Recent Labs  12/16/12 1533 12/17/12 0508  WBC 8.4 5.8  HGB 12.3* 10.5*  HCT 36.6* 31.0*  PLT 196 159   BMET  Recent Labs  12/16/12 1533 12/17/12 0508  NA 137 136  K 4.1 4.6  CL 100 102  CO2 23 22  GLUCOSE 88 133*  BUN 41* 40*  CREATININE 2.06* 2.13*  CALCIUM 9.3 8.4    COAG Lab Results  Component Value Date   INR 1.83* 12/16/2012   INR 2.3 11/20/2012   INR 1.61* 11/18/2012   No results found for this basename: PTT

## 2012-12-17 NOTE — Anesthesia Postprocedure Evaluation (Signed)
  Anesthesia Post-op Note  Patient: [name]  Procedure(s) Performed: Procedure(s): THROMBECTOMY  LEFT LEG BYPASS (Left)  Patient Location: PACU  Anesthesia Type:General  Level of Consciousness: awake  Airway and Oxygen Therapy: Patient Spontanous Breathing  Post-op Pain: mild  Post-op Assessment: Post-op Vital signs reviewed, Patient's Cardiovascular Status Stable, Respiratory Function Stable, Patent Airway, No signs of Nausea or vomiting and Pain level controlled  Post-op Vital Signs: Reviewed and stable  Complications: No apparent anesthesia complications

## 2012-12-17 NOTE — Progress Notes (Signed)
Utilization review completed.  

## 2012-12-18 ENCOUNTER — Encounter (HOSPITAL_COMMUNITY): Payer: Self-pay | Admitting: Vascular Surgery

## 2012-12-21 ENCOUNTER — Encounter: Payer: Self-pay | Admitting: Vascular Surgery

## 2012-12-22 ENCOUNTER — Encounter: Payer: Self-pay | Admitting: Vascular Surgery

## 2012-12-22 ENCOUNTER — Ambulatory Visit (INDEPENDENT_AMBULATORY_CARE_PROVIDER_SITE_OTHER): Payer: Medicare Other | Admitting: Vascular Surgery

## 2012-12-22 VITALS — BP 130/76 | HR 78 | Resp 18 | Ht 68.0 in | Wt 178.0 lb

## 2012-12-22 DIAGNOSIS — I739 Peripheral vascular disease, unspecified: Secondary | ICD-10-CM

## 2012-12-22 NOTE — Progress Notes (Signed)
Subjective:     Patient ID: [name], male   DOB: [birth year], 78 y.o.   MRN: [id number]  HPI this 15 are old male returns for continued followup regarding his severely ischemic left leg. He had recurrent thrombosis of his left femoral anterior tibial composite arm vein bypass recently and supple thrombectomy was performed by Dr. Darrick Penna in the graft promptly reoccluded. He is not a candidate for further revascularization he does this from previous discussion. He is not having severe rest pain and is able to sleep at night he states. He does have some numbness in the left foot. He is able to ambulate short distances. He is not ready to proceed with amputation yet. He is on chronic Coumadin for atrial fib and a pacemaker   Review of Systems     Objective:   Physical Exam BP 130/76  Pulse 78  Resp 18  Ht 5\' 8"  (1.727 m)  Wt 178 lb (80.74 kg)  BMI 27.07 kg/m2  General well-developed well-nourished male no apparent stress alert and oriented x3 Left inguinal wound almost completely healed with some granulation tissue remaining. Below knee incision where thrombectomy was performed is well healed and sutures are removed. 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

## 2012-12-24 ENCOUNTER — Telehealth: Payer: Self-pay

## 2012-12-24 ENCOUNTER — Encounter: Payer: Self-pay | Admitting: Vascular Surgery

## 2012-12-24 ENCOUNTER — Ambulatory Visit (INDEPENDENT_AMBULATORY_CARE_PROVIDER_SITE_OTHER): Payer: Medicare Other | Admitting: Vascular Surgery

## 2012-12-24 VITALS — BP 171/78 | HR 103 | Resp 16 | Ht 68.0 in | Wt 179.0 lb

## 2012-12-24 DIAGNOSIS — I739 Peripheral vascular disease, unspecified: Secondary | ICD-10-CM

## 2012-12-24 NOTE — Telephone Encounter (Signed)
Pt. Called to report opening of left leg incision that has a "thin bloody drainage".   Stated the drainage started 10/21 afternoon, after his follow-up appt. With Dr. Darrick Penna.  Requests to "have someone look at it and put the proper dressing on it."  Denies fever/ chills.  Denies any inflammation of the peri-incision.  Discussed with Dr. Darrick Penna; stated pt. Should go to the ER if actively bleeding, and if not, bring him to office for assessment.  Pt. stated when he removes the bandage, he can blot the incision, and see drainage on the gauze.  Advised we will work him in to the office for evaluation today.  Agrees w/ plan.

## 2012-12-24 NOTE — Progress Notes (Signed)
Patient is a 78 year old male who was added onto the office scheduled today for a dehiscence of his left lower extremity incision. The patient has had multiple prior bypasses in the left leg most recently a simple thrombectomy through a lateral calf incision. The sutures were removed earlier this week. The patient states that the wound separated today and there was some bleeding from it.  Physical exam:  Filed Vitals:   12/24/12 1350  BP: 171/78  Pulse: 103  Resp: 16  Height: 5\' 8"  (1.727 m)  Weight: 179 lb (81.194 kg)    Left lower extremity: Left groin incision slowly healing 2 cm area with granulation tissue, 3 cm lateral calf incision completely separated no active bleeding no purulent drainage no erythema  Assessment: Dehiscence of left lateral calf incision no obvious infection risk of bleeding should be minimal since graft is occluded Plan: Hydrogel left groin and lateral calf incision once daily. The patient will followup for a recheck of his wounds in 2 weeks.  Fabienne Bruns, MD [facility] [place] Office: [phone] Pager: [phone]

## 2013-01-05 ENCOUNTER — Ambulatory Visit (INDEPENDENT_AMBULATORY_CARE_PROVIDER_SITE_OTHER): Payer: Medicare Other | Admitting: Family

## 2013-01-05 ENCOUNTER — Telehealth: Payer: Self-pay | Admitting: Family Medicine

## 2013-01-05 DIAGNOSIS — I4891 Unspecified atrial fibrillation: Secondary | ICD-10-CM

## 2013-01-05 DIAGNOSIS — Z7901 Long term (current) use of anticoagulants: Secondary | ICD-10-CM

## 2013-01-05 NOTE — Telephone Encounter (Signed)
Patient Information:  Caller Name: [name]  Phone: [phone]  Patient: [name], [name]  Gender: Male  DOB: June 22, 1934  Age: 78 Years  PCP: Adline Mango [facility])  Office Follow Up:  Does the office need to follow up with this patient?: Yes  Instructions For The Office: Please call pt and advise   Symptoms  Reason For Call & Symptoms: Pt is calling to find out when his next PT/INR is due? He tells RN it was last checked 2 months ago and no one has notified him when to come in next. RN checked EPIC and saw that the last record of a PT/INR was on 12/16/12 in [facility]. Pt doesn't remember the hospitalization but tells RN he has been taking Coumadin 5mg  QDay except on THursdays. Pt tells RN he is "in the process of losing his leg" and he is holding on as long as the pain is bearable. Could the office call him and let him know when next to come in for PT/INR  Reviewed Health History In EMR: Yes  Reviewed Medications In EMR: Yes  Reviewed Allergies In EMR: Yes  Reviewed Surgeries / Procedures: Yes  Date of Onset of Symptoms: Unknown  Guideline(s) Used:  No Protocol Available - Information Only  Disposition Per Guideline:   Discuss with PCP and Callback by Nurse Today  Reason For Disposition Reached:   Nursing judgment  Advice Given:  N/A  Patient Will Follow Care Advice:  YES

## 2013-01-05 NOTE — Telephone Encounter (Signed)
Appointment scheduled.

## 2013-01-05 NOTE — Patient Instructions (Signed)
TAke an extra 1/2 tablet today only. Then continue to take 1/2 tab daily, except none on Thursday. Recheck in 3 weeks.   Anticoagulation Dose Instructions as of 01/05/2013     Glynis Smiles Tue Wed Thu Fri Sat   New Dose 2.5 mg 2.5 mg 2.5 mg 2.5 mg 0 mg 2.5 mg 2.5 mg    Description       TAke an extra 1/2 tablet today only. Then continue to take 1/2 tab daily, except none on Thursday. Recheck in 3 weeks.

## 2013-01-11 ENCOUNTER — Encounter: Payer: Self-pay | Admitting: Vascular Surgery

## 2013-01-12 ENCOUNTER — Encounter: Payer: Self-pay | Admitting: Vascular Surgery

## 2013-01-12 ENCOUNTER — Ambulatory Visit (INDEPENDENT_AMBULATORY_CARE_PROVIDER_SITE_OTHER): Payer: Medicare Other | Admitting: Vascular Surgery

## 2013-01-12 VITALS — BP 145/63 | HR 80 | Temp 98.1°F | Resp 18 | Ht 68.0 in | Wt 180.6 lb

## 2013-01-12 DIAGNOSIS — I739 Peripheral vascular disease, unspecified: Secondary | ICD-10-CM

## 2013-01-12 DIAGNOSIS — Z48812 Encounter for surgical aftercare following surgery on the circulatory system: Secondary | ICD-10-CM | POA: Insufficient documentation

## 2013-01-12 NOTE — Progress Notes (Signed)
Subjective:     Patient ID: [name], male   DOB: 06/25/1934, 78 y.o.   MRN: [id number]  HPI this 78-year-old male returns for continued followup regarding his ischemic left leg. He is not reconstruct was had multiple attempts at revascularization. He is about ready to proceed with left above-knee amputation with chronic numbness and some pain in the left foot.  Past Medical History  Diagnosis Date  . Hyperlipidemia   . Gout   . Benign prostatic hypertrophy   . Atrial fibrillation     sees Dr. Henry Smith  . Chronic systolic dysfunction of left ventricle     a. mixed ischemic and nonischemic CM,  EF 35%. b. s/p AICD implantation.  . CHF (congestive heart failure)   . ED (erectile dysfunction)   . Arthritis   . Pacemaker     medtronic  . CAD (coronary artery disease)     a. s/p mid LAD stenting with DES 2008 Dr. Henry Smith  . ICD (implantable cardiac defibrillator) in place     medtronic, Dr. Allred  . ICD (implantable cardiac defibrillator) in place     due for check in Feb/2013  . Sleep apnea     hx of "had surgery for"  . Hypertension     sees Dr. S. Fry  . Type II diabetes mellitus   . Automatic implantable cardioverter-defibrillator in situ   . Depression   . Numbness and tingling     Hx; 0f left foot  . PAD (peripheral artery disease)     Severe by PV angiogram 09/2011  . Renal artery stenosis     s/p stenting 2009  . PAD (peripheral artery disease)     s/p multiple LLE bypass grafts; left mid-distal SCA occlusion by 08/2012 duplex    History  Substance Use Topics  . Smoking status: Never Smoker   . Smokeless tobacco: Never Used     Comment: 1-2 cigars when golfing  . Alcohol Use: 2.4 oz/week    2 Glasses of wine, 2 Shots of liquor per week     Comment: 07/10/2012 "galss of wine or vodka tonic 3 X/wk"    Family History  Problem Relation Age of Onset  . Cancer      breast/fhx  . Heart disease      fhx  . Diabetes Neg Hx   . Cancer Father      Allergies  Allergen Reactions  . Other Other (See Comments)    Plastic tape tears skin    Current outpatient prescriptions:allopurinol (ZYLOPRIM) 100 MG tablet, Take 100 mg by mouth daily., Disp: , Rfl: ;  atorvastatin (LIPITOR) 10 MG tablet, Take 1 tablet (10 mg total) by mouth daily., Disp: 30 tablet, Rfl: 4;  carvedilol (COREG) 6.25 MG tablet, Take 1 tablet (6.25 mg total) by mouth 2 (two) times daily with a meal., Disp: 180 tablet, Rfl: 3;  finasteride (PROSCAR) 5 MG tablet, Take 5 mg by mouth daily., Disp: , Rfl:  furosemide (LASIX) 40 MG tablet, Take 1 tablet (40 mg total) by mouth daily., Disp: 90 tablet, Rfl: 3;  gabapentin (NEURONTIN) 300 MG capsule, Take 300-600 mg by mouth 2 (two) times daily. Takes 300 mg (1 capsule) in the morning and 600 mg (2 capsules) at night, Disp: , Rfl:  insulin aspart protamine-insulin aspart (NOVOLOG 70/30) (70-30) 100 UNIT/ML injection, Inject 10 Units into the skin 2 (two) times daily with a meal. 10 units with breakfast, and 10 units with the evening meal,   Disp: , Rfl: ;  losartan (COZAAR) 100 MG tablet, Take 1 tablet (100 mg total) by mouth daily., Disp: 90 tablet, Rfl: 3 NITROSTAT 0.4 MG SL tablet, Place 0.4 mg under the tongue every 5 (five) minutes as needed for chest pain. For chest pain, max 3 doses, Disp: , Rfl: ;  oxyCODONE-acetaminophen (PERCOCET) 10-325 MG per tablet, Take 1-2 tablets by mouth every 4 (four) hours as needed for pain., Disp: 60 tablet, Rfl: 0;  oxyCODONE-acetaminophen (PERCOCET/ROXICET) 5-325 MG per tablet, Take 1-2 tablets by mouth every 4 (four) hours as needed., Disp: 30 tablet, Rfl: 0 warfarin (COUMADIN) 5 MG tablet, Take 0-2.5 mg by mouth daily. Takes 2.5 mg daily EXCEPT for none on Thursday, Disp: , Rfl:   BP 145/63  Pulse 80  Temp(Src) 98.1 F (36.7 C) (Oral)  Resp 18  Ht 5' 8" (1.727 m)  Wt 180 lb 9.6 oz (81.92 kg)  BMI 27.47 kg/m2  SpO2 95%  Body mass index is 27.47 kg/(m^2).        Review of Systems  denies chest pain, dyspnea on exertion, PND, orthopnea, hemoptysis. All other systems negative and complete review of    Objective:   Physical Exam BP 145/63  Pulse 80  Temp(Src) 98.1 F (36.7 C) (Oral)  Resp 18  Ht 5' 8" (1.727 m)  Wt 180 lb 9.6 oz (81.92 kg)  BMI 27.47 kg/m2  SpO2 95%  Gen.-alert and oriented x3 in no apparent distress HEENT normal for age Lungs no rhonchi or wheezing Cardiovascular regular rhythm no murmurs carotid pulses 3+ palpable no bruits audible Abdomen soft nontender no palpable masses Musculoskeletal free of  major deformities Skin clear -no rashes Neurologic normal Lower extremities 3+ femoral pulses bilaterally. Left leg with nonhealing wound below the knee laterally were femoral-tibial graft was thrombectomized recently. This one is 3 x 2 cm. It is not infected. There is also a dry eschar in the pretibial region on the left. Small ulcer on the left heel. No cellulitis or infection. Right leg has 3+ femoral pulse and well-perfused right foot       Assessment:     Chronic ischemia left foot with failed revascularizations-patient ready to proceed with left AKA in early December Patient on chronic anticoagulations for atrial fibrillation with pacemaker-defibrillator in place    Plan:     #1 hold Coumadin following November 28 dose #2 plan left AKA on Thursday, December 4-wrist and benefits thoroughly discussed and patient would like to proceed      

## 2013-01-13 ENCOUNTER — Other Ambulatory Visit: Payer: Self-pay

## 2013-01-18 ENCOUNTER — Encounter: Payer: Self-pay | Admitting: *Deleted

## 2013-01-18 NOTE — Progress Notes (Signed)
Patient dropped in today without an appt. He is scheduled for an AKA on 02-04-13 by Dr. Hart Rochester. Okey Regal is working on getting his Coumadin stopped on 01-29-13 and then bridging him. She has contacted Dr. Johney Frame who said she needed to get the bridge medication orders from Dr. Katrinka Blazing)  Mr. Harts came in saying that he had clear drainage coming from his old incisions. He says he has had no fevers. When I asked if it was the same drainage that he had last week, He could not remember dropping in then or seeing Dr. Hart Rochester and scheduling his surgery. He seemed very confused. When I told him I could write down his future appts and when to stop the Coumadin, he refused and said he didn't need it. I did get him to take Okey Regal and Judy's phone number in case he needed to discuss his surgery or move it up if his pain got too bad.

## 2013-01-19 ENCOUNTER — Telehealth: Payer: Self-pay | Admitting: Pharmacist

## 2013-01-19 NOTE — Telephone Encounter (Signed)
Please bridge accordingly and notify patient with recommendations. Thanks

## 2013-01-19 NOTE — Telephone Encounter (Signed)
Message copied by Evie Lacks on Tue Jan 19, 2013  3:17 PM ------      Message from: Phillips Odor      Created: Tue Jan 19, 2013  9:07 AM      Regarding: FW: scheduled surgery/ need to hold Coumadin       This pt. needs Lovenox bridging per Dr. Verdis Prime.  He is scheduled for Left AKA 02/04/13.  We have instructed him to take his last dose of Coumadin on 11/28.  Please advise pt. on Lovenox.               ----- Message -----         From: Lesleigh Noe, MD         Sent: 01/18/2013   8:23 PM           To: Conley Simmonds Pullins, RN      Subject: RE: scheduled surgery/ need to hold Coumadin             Yes      ----- Message -----         From: Erenest Blank, RN         Sent: 01/18/2013   9:27 AM           To: Lesleigh Noe, MD      Subject: Annell Greening: scheduled surgery/ need to hold Coumadin             Please advise if pt. needs a Lovenox bridge in absence of Coumadin      5 days prior to Left AKA.             ----- Message -----         From: Hillis Range, MD         Sent: 01/15/2013   5:13 PM           To: Conley Simmonds Pullins, RN      Subject: RE: scheduled surgery/ need to hold Coumadin             Her primary cardiologist is Dr Katrinka Blazing.  I will defer to him.                        ----- Message -----         From: Erenest Blank, RN         Sent: 01/14/2013   2:42 PM           To: Hillis Range, MD      Subject: FW: scheduled surgery/ need to hold Coumadin             Please advise if pt. will need Lovenox bridge; we advised him to hold his Coumadin 5 days prior to Left AKA scheduled for 02/04/13.             ----- Message -----         From: Nelwyn Salisbury, MD         Sent: 01/14/2013   1:59 PM           To: Conley Simmonds Pullins, RN      Subject: RE: scheduled surgery/ need to hold Coumadin             I would ask you to direct this question to his cardiologist, Dr. Areatha Keas      ----- Message -----         From:  Erenest Blank, RN         Sent: 01/12/2013   11:16 AM           To: Nelwyn Salisbury, MD      Subject: scheduled surgery/ need to hold Coumadin                 Dr. Clent Ridges,            Mr. Jackson is scheduled for a Left AKA on 02/04/13.  We have advised him to hold Coumadin 5 days prior to surgery; last dose 11/28. Please advise if this pt. will need a Lovenox bridge in the absence of Coumadin; if so will your office dose and instruct on the Lovenox?                                     ------

## 2013-01-19 NOTE — Telephone Encounter (Signed)
Will forward note to Adline Mango at [place] who monitors pt's INR to help arrange Lovenox bridge.

## 2013-01-20 ENCOUNTER — Encounter: Payer: Self-pay | Admitting: Family

## 2013-01-20 ENCOUNTER — Telehealth: Payer: Self-pay

## 2013-01-20 NOTE — Telephone Encounter (Signed)
Phone call from nurse care manager with [facility], reporting pt. Has a pinpoint opening in left leg , medial to the left knee.  States there is a purulent, serous drainage.  Questioned about erythema surrounding the pinpoint opening; stated she didn't note erythema.     Requesting an antibiotic be called in for pt., due to plan for upcoming surgery.  Advised nurse care manager that Dr. Hart Rochester, who is familiar with the pt., is not available, and would need to bring pt. in for evaluation.  Will contact pt. with appt. to see nurse practitioner tomorrow, 11/20.

## 2013-01-21 ENCOUNTER — Ambulatory Visit (INDEPENDENT_AMBULATORY_CARE_PROVIDER_SITE_OTHER): Payer: Medicare Other | Admitting: Family

## 2013-01-21 ENCOUNTER — Encounter: Payer: Self-pay | Admitting: Family

## 2013-01-21 VITALS — BP 135/64 | HR 70 | Temp 98.0°F | Resp 16 | Ht 68.0 in | Wt 181.0 lb

## 2013-01-21 DIAGNOSIS — IMO0001 Reserved for inherently not codable concepts without codable children: Secondary | ICD-10-CM | POA: Insufficient documentation

## 2013-01-21 DIAGNOSIS — G4700 Insomnia, unspecified: Secondary | ICD-10-CM

## 2013-01-21 DIAGNOSIS — M79609 Pain in unspecified limb: Secondary | ICD-10-CM

## 2013-01-21 DIAGNOSIS — I739 Peripheral vascular disease, unspecified: Secondary | ICD-10-CM

## 2013-01-21 DIAGNOSIS — B9789 Other viral agents as the cause of diseases classified elsewhere: Secondary | ICD-10-CM

## 2013-01-21 MED ORDER — CEPHALEXIN 500 MG PO CAPS: 500.0000 mg | ORAL_CAPSULE | Freq: Three times a day (TID) | ORAL | Status: DC

## 2013-01-21 MED ORDER — ZOLPIDEM TARTRATE ER 6.25 MG PO TBCR: 6.2500 mg | EXTENDED_RELEASE_TABLET | Freq: Every evening | ORAL | Status: DC | PRN

## 2013-01-21 NOTE — Patient Instructions (Addendum)
Peripheral Vascular Disease Peripheral Vascular Disease (PVD), also called Peripheral Arterial Disease (PAD), is a circulation problem caused by cholesterol (atherosclerotic plaque) deposits in the arteries. PVD commonly occurs in the lower extremities (legs) but it can occur in other areas of the body, such as your arms. The cholesterol buildup in the arteries reduces blood flow which can cause pain and other serious problems. The presence of PVD can place a person at risk for Coronary Artery Disease (CAD).  CAUSES  Causes of PVD can be many. It is usually associated with more than one risk factor such as:   High Cholesterol.  Smoking.  Diabetes.  Lack of exercise or inactivity.  High blood pressure (hypertension).  Obesity.  Family history. SYMPTOMS   When the lower extremities are affected, patients with PVD may experience:  Leg pain with exertion or physical activity. This is called INTERMITTENT CLAUDICATION. This may present as cramping or numbness with physical activity. The location of the pain is associated with the level of blockage. For example, blockage at the abdominal level (distal abdominal aorta) may result in buttock or hip pain. Lower leg arterial blockage may result in calf pain.  As PVD becomes more severe, pain can develop with less physical activity.  In people with severe PVD, leg pain may occur at rest.  Other PVD signs and symptoms:  Leg numbness or weakness.  Coldness in the affected leg or foot, especially when compared to the other leg.  A change in leg color.  Patients with significant PVD are more prone to ulcers or sores on toes, feet or legs. These may take longer to heal or may reoccur. The ulcers or sores can become infected.  If signs and symptoms of PVD are ignored, gangrene may occur. This can result in the loss of toes or loss of an entire limb.  Not all leg pain is related to PVD. Other medical conditions can cause leg pain such  as:  Blood clots (embolism) or Deep Vein Thrombosis.  Inflammation of the blood vessels (vasculitis).  Spinal stenosis. DIAGNOSIS  Diagnosis of PVD can involve several different types of tests. These can include:  Pulse Volume Recording Method (PVR). This test is simple, painless and does not involve the use of X-rays. PVR involves measuring and comparing the blood pressure in the arms and legs. An ABI (Ankle-Brachial Index) is calculated. The normal ratio of blood pressures is 1. As this number becomes smaller, it indicates more severe disease.  < 0.95  indicates significant narrowing in one or more leg vessels.  <0.8 there will usually be pain in the foot, leg or buttock with exercise.  <0.4 will usually have pain in the legs at rest.  <0.25  usually indicates limb threatening PVD.  Doppler detection of pulses in the legs. This test is painless and checks to see if you have a pulses in your legs/feet.  A dye or contrast material (a substance that highlights the blood vessels so they show up on x-ray) may be given to help your caregiver better see the arteries for the following tests. The dye is eliminated from your body by the kidney's. Your caregiver may order blood work to check your kidney function and other laboratory values before the following tests are performed:  Magnetic Resonance Angiography (MRA). An MRA is a picture study of the blood vessels and arteries. The MRA machine uses a large magnet to produce images of the blood vessels.  Computed Tomography Angiography (CTA). A CTA is a   specialized x-ray that looks at how the blood flows in your blood vessels. An IV may be inserted into your arm so contrast dye can be injected.  Angiogram. Is a procedure that uses x-rays to look at your blood vessels. This procedure is minimally invasive, meaning a small incision (cut) is made in your groin. A small tube (catheter) is then inserted into the artery of your groin. The catheter is  guided to the blood vessel or artery your caregiver wants to examine. Contrast dye is injected into the catheter. X-rays are then taken of the blood vessel or artery. After the images are obtained, the catheter is taken out. TREATMENT  Treatment of PVD involves many interventions which may include:  Lifestyle changes:  Quitting smoking.  Exercise.  Following a low fat, low cholesterol diet.  Control of diabetes.  Foot care is very important to the PVD patient. Good foot care can help prevent infection.  Medication:  Cholesterol-lowering medicine.  Blood pressure medicine.  Anti-platelet drugs.  Certain medicines may reduce symptoms of Intermittent Claudication.  Interventional/Surgical options:  Angioplasty. An Angioplasty is a procedure that inflates a balloon in the blocked artery. This opens the blocked artery to improve blood flow.  Stent Implant. A wire mesh tube (stent) is placed in the artery. The stent expands and stays in place, allowing the artery to remain open.  Peripheral Bypass Surgery. This is a surgical procedure that reroutes the blood around a blocked artery to help improve blood flow. This type of procedure may be performed if Angioplasty or stent implants are not an option. SEEK IMMEDIATE MEDICAL CARE IF:   You develop pain or numbness in your arms or legs.  Your arm or leg turns cold, becomes blue in color.  You develop redness, warmth, swelling and pain in your arms or legs. MAKE SURE YOU:   Understand these instructions.  Will watch your condition.  Will get help right away if you are not doing well or get worse. Document Released: 03/28/2004 Document Revised: 05/13/2011 Document Reviewed: 02/23/2008 ExitCare Patient Information 2014 [employer].   Amputation Many new amputations occur each year. The most common causes of amputation of the lower extremity (the hip down) are:  Disease.  Injury caused in an accidents or wars  (trauma).  Birth defects.  Lumps (tumors) that are cancer. Upper extremity amputation is usually the result of trauma or birth defect, with disease being a less common cause. COMMON PROBLEMS After an amputation a number of issues need to be considered. Getting around and self-care are early problems that must be dealt with. A complete rehabilitation program will help the amputee recover mobility. A team approach of caregivers helps the most. Caregivers that can provide a well rounded program include:   Physicians.  Therapists.  Nurses.  Social workers.  Psychologists. Usually there are problems with body image and coping with lifestyle changes. A grieving period similar to dealing with a death in the family is common after an amputation. Talking to a trained professional with experience in treating people with similar problems can be very helpful. When returning to a previous lifestyle, questions about sexuality can arise. Many of these uncertainties are normal. These can be discussed with your psychologist or rehabilitation specialist. REHABILITATION AND RETURN TO WORK AND ACTIVITIES Returning to recreational activities and employment are part of recovery. Many times, changes to recreation equipment can allow return to a sport or hobby. A device that substitutes the missing part of the body is called a   prosthetic. Many prosthetic manufacturers produce components designed for sports. Be sure to discuss all of your leisure interests with your prosthetist. This is the person who helps provide you with custom made replacement limbs. Your physician will also help to select a prosthetic that will meet your needs. Employers will vary in their willingness to change a work environment in order to help people with disabilities. Your therapists can perform job site evaluations. Your therapist can then make recommendations to help with your work area. Some amputees will not be able to return to previous  jobs. Your local Office of Vocational Rehabilitation can assist you in job retraining.  Once you are past the initial rehabilitation stage you will have ongoing contact with caregivers and a prosthetist. You need to work closely with them in making decisions about your prosthetic device. PROGNOSIS  Amputation should not end your joy of life. There are people with limb loss in nearly all walks of life. They are in a wide variety of professions. They participate in nearly all sports. Ask your caregivers about support groups and sports organizations in your area. They can help you with referral to organizations that will be helpful to you. Document Released: 11/10/2001 Document Revised: 05/13/2011 Document Reviewed: 01/05/2007 ExitCare Patient Information 2014 [employer].  

## 2013-01-21 NOTE — Progress Notes (Signed)
[facility] [place] HISTORY AND PHYSICAL -PAD  History of Present Illness [name] is a 78 y.o. male patient of Dr. Hart Rochester who comes in today with complaint of purulent serous drainage from the medial aspect of his left leg for the past 10 days; this is from a remote vascular surgery incision. He denies fever or chills He has a history of chronic ischemia of the left foot with failed revascularizations; Dr. Candie Chroman note from 01/12/13 documents that patient is ready to proceed with left AKA in early December  Patient on chronic anticoagulations for atrial fibrillation with pacemaker-defibrillator in place  Plan (01/12/13):   #1 hold Coumadin following November 28 dose  #2 plan left AKA on Thursday, December 4- risks and benefits thoroughly discussed with the patient by Dr. Hart Rochester on 01/12/13, and patient would like to proceed  Pt. reports rest pain; reports night pain in left lower leg and foot, has trouble sleeping, denies steal symptoms in either arm.  Pt Diabetic: Yes, states in good control Pt smoker: non-smoker   Other anticoagulants/antiplatelets: coumadin for atrial fib  Past Medical History  Diagnosis Date  . Hyperlipidemia   . Gout   . Benign prostatic hypertrophy   . Atrial fibrillation     sees Dr. Verdis Prime  . Chronic systolic dysfunction of left ventricle     a. mixed ischemic and nonischemic CM,  EF 35%. b. s/p AICD implantation.  . CHF (congestive heart failure)   . ED (erectile dysfunction)   . Arthritis   . Pacemaker     medtronic  . CAD (coronary artery disease)     a. s/p mid LAD stenting with DES 2008 Dr. Verdis Prime  . ICD (implantable cardiac defibrillator) in place     medtronic, Dr. Johney Frame  . ICD (implantable cardiac defibrillator) in place     due for check in Feb/2013  . Sleep apnea     hx of "had surgery for"  . Hypertension     sees Dr. Claris Che  . Type II diabetes mellitus   . Automatic implantable  cardioverter-defibrillator in situ   . Depression   . Numbness and tingling     Hx; 61f left foot  . PAD (peripheral artery disease)     Severe by PV angiogram 09/2011  . Renal artery stenosis     s/p stenting 2009  . PAD (peripheral artery disease)     s/p multiple LLE bypass grafts; left mid-distal SCA occlusion by 08/2012 duplex    Social History History  Substance Use Topics  . Smoking status: Never Smoker   . Smokeless tobacco: Never Used     Comment: 1-2 cigars when golfing  . Alcohol Use: 2.4 oz/week    2 Glasses of wine, 2 Shots of liquor per week     Comment: 07/10/2012 "galss of wine or vodka tonic 3 X/wk"    Family History Family History  Problem Relation Age of Onset  . Cancer      breast/fhx  . Heart disease      fhx  . Diabetes Neg Hx   . Cancer Father     Past Surgical History  Procedure Laterality Date  . Turp vaporization    . Cardiac defibrillator placement  12/26/09    pacemaker combo  . Cervical epidural injection  2013  . Femoral-tibial bypass graft  09/25/2011    Procedure: BYPASS GRAFT FEMORAL-TIBIAL ARTERY;  Surgeon: Pryor Ochoa, MD;  Location: [facility] OR;  Service: Vascular;  Laterality: Left;  Left Femoral - Anterior Tibial Bypass;  saphenous vein graft from left leg  . Intraoperative arteriogram  09/25/2011    Procedure: INTRA OPERATIVE ARTERIOGRAM;  Surgeon: Pryor Ochoa, MD;  Location: [facility] OR;  Service: Vascular;  Laterality: Left;  . Femoral-tibial bypass graft  02/07/2012    Procedure: BYPASS GRAFT FEMORAL-TIBIAL ARTERY;  Surgeon: Larina Earthly, MD;  Location: [facility] OR;  Service: Vascular;  Laterality: Left;  Thrombectomy Left Femoral - Tibial Bypass Graft  . Embolectomy  02/07/2012    Procedure: EMBOLECTOMY;  Surgeon: Larina Earthly, MD;  Location: [facility] OR;  Service: Vascular;  Laterality: Left;  . Femoral-tibial bypass graft  04/03/2012    Procedure: BYPASS GRAFT FEMORAL-TIBIAL ARTERY;  Surgeon: Pryor Ochoa, MD;  Location: [facility] OR;  Service: Vascular;   Laterality: Left;  Redo  . Insert / replace / remove pacemaker  2007  . Coronary angioplasty with stent placement  ~ 2000  . Coronary angioplasty    . Uvulopalatopharyngoplasty (uppp)/tonsillectomy/septoplasty  06/30/2003    [name] 06/30/2003 (07/10/2012)  . Shoulder open rotator cuff repair Right 2001    repair of lacerated right/notes 10/11/1999  (07/10/2012)  . Foot surgery Right 03/20/2001    "have plates and screws in; didn't break it" (07/10/2012)  . Carpal tunnel release Right 2002    [name] 03/20/2001 (07/10/2012)  . Biceps tendon repair Right 2001    [name] 03/20/2001 (07/10/2012)  . Renal artery stent  2009  . Femoral-popliteal bypass graft Left 09/16/2012    Procedure: LEFT FEMORAL-POPLITEAL BYPASS GRAFT WITH GORTEX Propaten Graft 6x80 Thin Wall and Left lower leg Angiogram;  Surgeon: Pryor Ochoa, MD;  Location: [facility] OR;  Service: Vascular;  Laterality: Left;  . Colonoscopy      Hx; of  . Tonsillectomy    . Adenoidectomy      Hx; of   . Femoral-tibial bypass graft Left 09/30/2012    Procedure: REDO LEFT FEMORAL-ANTERIOR TIBIAL ARTERY BYPASS USING COMPOSITE CEPHALIC AND BASILIC VEIN GRAFT FROM LEFT ARM;  Surgeon: Pryor Ochoa, MD;  Location: [facility] OR;  Service: Vascular;  Laterality: Left;  . I&d extremity Left 10/21/2012    Procedure: EXPLORATION AND DEBRIDEMENT OF LEFT GROIN WOUND;  Surgeon: Pryor Ochoa, MD;  Location: [facility] OR;  Service: Vascular;  Laterality: Left;  . Patch angioplasty Left 10/21/2012    Procedure: PATCH ANGIOPLASTY;  Surgeon: Pryor Ochoa, MD;  Location: [facility] OR;  Service: Vascular;  Laterality: Left;  . Groin debridement Left 11/12/2012    Procedure: CLOSURE INGUINAL WOUND;  Surgeon: Pryor Ochoa, MD;  Location: [facility] OR;  Service: Vascular;  Laterality: Left;  . Embolectomy Left 12/16/2012    Procedure: THROMBECTOMY  LEFT LEG BYPASS;  Surgeon: Sherren Kerns, MD;  Location: [facility] OR;  Service: Vascular;  Laterality: Left;    Allergies  Allergen Reactions  . Other Other  (See Comments)    Plastic tape tears skin    Current Outpatient Prescriptions  Medication Sig Dispense Refill  . allopurinol (ZYLOPRIM) 100 MG tablet Take 100 mg by mouth daily.      Marland Kitchen atorvastatin (LIPITOR) 10 MG tablet Take 1 tablet (10 mg total) by mouth daily.  30 tablet  4  . carvedilol (COREG) 6.25 MG tablet Take 1 tablet (6.25 mg total) by mouth 2 (two) times daily with a meal.  180 tablet  3  . finasteride (PROSCAR) 5 MG tablet Take 5 mg by mouth daily.      Marland Kitchen  furosemide (LASIX) 40 MG tablet Take 1 tablet (40 mg total) by mouth daily.  90 tablet  3  . gabapentin (NEURONTIN) 300 MG capsule Take 300-600 mg by mouth 2 (two) times daily. Takes 300 mg (1 capsule) in the morning and 600 mg (2 capsules) at night      . insulin aspart protamine-insulin aspart (NOVOLOG 70/30) (70-30) 100 UNIT/ML injection Inject 10 Units into the skin 2 (two) times daily with a meal. 10 units with breakfast, and 10 units with the evening meal      . losartan (COZAAR) 100 MG tablet Take 1 tablet (100 mg total) by mouth daily.  90 tablet  3  . NITROSTAT 0.4 MG SL tablet Place 0.4 mg under the tongue every 5 (five) minutes as needed for chest pain. For chest pain, max 3 doses      . oxyCODONE-acetaminophen (PERCOCET) 10-325 MG per tablet Take 1-2 tablets by mouth every 4 (four) hours as needed for pain.  60 tablet  0  . oxyCODONE-acetaminophen (PERCOCET/ROXICET) 5-325 MG per tablet Take 1-2 tablets by mouth every 4 (four) hours as needed.  30 tablet  0  . warfarin (COUMADIN) 5 MG tablet Take 0-2.5 mg by mouth daily. Takes 2.5 mg daily EXCEPT for none on Thursday       No current facility-administered medications for this visit.    ROS: [x]  Positive   [ ]  Denies  General:[ ]  Weight loss,  [ ]  Weight gain, [ ]  Fever, [ ]  chills Neurologic: [ ]  Dizziness, [ ]  Blackouts, [ ]  Seizure [ ]  Stroke, [ ]  "Mini stroke", [ ]  Slurred speech, [ ]  Temporary blindness;  [ ] weakness, [ ]  Hoarseness Cardiac: [ ]  Chest  pain/pressure, [ ]  Shortness of breath at rest [ ]  Shortness of breath with exertion,  [ ]   Atrial fibrillation or irregular heartbeat Vascular:[ ]  Pain in legs with walking, [ ]  Pain in legs at rest ,[ ]  Pain in legs at night,  [ ]   Non-healing ulcer, [ ]  Blood clot in vein/DVT,   Pulmonary: [ ]  Home oxygen, [ ]   Productive cough, [ ]  Coughing up blood,  [ ]  Asthma,  [ ]  Wheezing Musculoskeletal:  [ ]  Arthritis, [ ]  Low back pain,  [ ]  Joint pain Hematologic:[ ]  Easy Bruising, [ ]  Anemia; [ ]  Hepatitis Gastrointestinal: [ ]  Blood in stool,  [ ]  Gastroesophageal Reflux, [ ]  Trouble swallowing Urinary: [ ]  chronic Kidney disease, [ ]  on HD, [ ]  Burning with urination, [ ]  Frequent urination, [ ]  Difficulty urinating;  Skin: [ ]  Rashes, [ ]  Wounds     Physical Examination Filed Vitals:   01/21/13 0924  BP: 135/64  Pulse: 70  Temp: 98 F (36.7 C)  Resp: 16   Filed Weights   01/21/13 0924  Weight: 181 lb (82.101 kg)   Body mass index is 27.53 kg/(m^2).  General: A&O x 3, WDWN,  Gait: limp Eyes: PERRLA, Pulmonary: CTAB, without wheezes , rales or rhonchi Cardiac: regular Rythm , no murmur detected          Carotid Bruits Left Right   Negative Negative  Aorta: is not palpable Radial pulses: left radial and brachial not palpable, right radial is faintly palpable                           VASCULAR EXAM: Extremities with ischemic changes in left foot as described by Dr. Hart Rochester on 01/12/13.  with Gangrenous area on plantar surface of left foot.                                                                                                          LE Pulses LEFT RIGHT       FEMORAL   palpable   palpable        POPLITEAL  not palpable   not palpable       POSTERIOR TIBIAL  not palpable   not palpable        DORSALIS PEDIS      ANTERIOR TIBIAL not palpable  not palpable    Abdomen: soft, NT, no masses. Musculoskeletal: no muscle wasting or atrophy.  Neurologic: A&O X  3;grossly intact.  ASSESSMENT: [name] is a 78 y.o. male who presents with small amount purulent/serous drainage from pinhole area left medial old incision, no redness, no swelling. He has a hx of MRSA. Dr. Darrick Penna examine patient and spoke with him. Sent prescription for Keflex 500 mg tid and Ambien 15 mg qhs prn to his pharmacy. Left AKA as scheduled Dec. 4.   PLAN:  I discussed in depth with the patient the nature of atherosclerosis, and emphasized the importance of maximal medical management including strict control of blood pressure, blood glucose, and lipid levels, obtaining regular exercise, and continued cessation of smoking.  The patient is aware that without maximal medical management the underlying atherosclerotic disease process will progress, limiting the benefit of any interventions. The patient is scheduled for AKA Dec. 4.  The patient was given information about PAD including signs, symptoms, treatment, what symptoms should prompt the patient to seek immediate medical care, and risk reduction measures to take.  Charisse March, RN, MSN, FNP-C [facility] [place] Phone: [phone]  Clinic MD: [facility]  01/21/2013 9:05 AM

## 2013-01-22 ENCOUNTER — Encounter (HOSPITAL_COMMUNITY): Payer: Self-pay

## 2013-01-22 ENCOUNTER — Other Ambulatory Visit: Payer: Self-pay | Admitting: General Practice

## 2013-01-22 ENCOUNTER — Encounter (HOSPITAL_COMMUNITY)
Admission: RE | Admit: 2013-01-22 | Discharge: 2013-01-22 | Disposition: A | Discharge status: Home / Self Care | Payer: Medicare Other | Source: Ambulatory Visit | Attending: Vascular Surgery | Admitting: Vascular Surgery

## 2013-01-22 ENCOUNTER — Telehealth: Payer: Self-pay | Admitting: General Practice

## 2013-01-22 DIAGNOSIS — Z01812 Encounter for preprocedural laboratory examination: Secondary | ICD-10-CM | POA: Insufficient documentation

## 2013-01-22 DIAGNOSIS — Z01818 Encounter for other preprocedural examination: Secondary | ICD-10-CM | POA: Diagnosis present

## 2013-01-22 LAB — CBC
Hemoglobin: 12 g/dL — ABNORMAL LOW (ref 13.0–17.0)
MCH: 26.9 pg (ref 26.0–34.0)
MCHC: 32.5 g/dL (ref 30.0–36.0)
MCV: 82.7 fL (ref 78.0–100.0)
RBC: 4.46 MIL/uL (ref 4.22–5.81)

## 2013-01-22 LAB — BASIC METABOLIC PANEL
BUN: 44 mg/dL — ABNORMAL HIGH (ref 6–23)
CO2: 20 mEq/L (ref 19–32)
Creatinine, Ser: 2.3 mg/dL — ABNORMAL HIGH (ref 0.50–1.35)
GFR calc Af Amer: 30 mL/min — ABNORMAL LOW (ref >90)
GFR calc non Af Amer: 26 mL/min — ABNORMAL LOW (ref >90)
Glucose, Bld: 225 mg/dL — ABNORMAL HIGH (ref 70–99)
Potassium: 4.3 mEq/L (ref 3.5–5.1)
Sodium: 136 mEq/L (ref 135–145)

## 2013-01-22 LAB — PROTIME-INR: INR: 2.34 — ABNORMAL HIGH (ref 0.00–1.49)

## 2013-01-22 MED ORDER — ENOXAPARIN SODIUM 80 MG/0.8ML ~~LOC~~ SOLN: 1.0000 mg/kg | SUBCUTANEOUS | Status: DC

## 2013-01-22 NOTE — Progress Notes (Signed)
Pacer /defib orders faxed and req'd back. No further action needed.

## 2013-01-22 NOTE — Pre-Procedure Instructions (Addendum)
NEYMAR DOWE  01/22/2013   Your procedure is scheduled on:  02/04/13  Report to [facility] short stay admitting at 530 AM.  Call this number if you have problems the morning of surgery: [phone]   Remember:   Do not eat food or drink liquids after midnight.   Take these medicines the morning of surgery with A SIP OF WATER: gabapentin, allopurinol, carvedilol,keflex,proscar, pain med if needed                          STOP coumadin per dr              Despina Arias all herbel meds, nsaids (aleve,naproxen,advil,ibuprofen) 5 days prior to surgery   Do not wear jewelry, make-up or nail polish.  Do not wear lotions, powders, or perfumes. You may wear deodorant.  Do not shave 48 hours prior to surgery. Men may shave face and neck.  Do not bring valuables to the hospital.  [facility] is not responsible                  for any belongings or valuables.               Contacts, dentures or bridgework may not be worn into surgery.  Leave suitcase in the car. After surgery it may be brought to your room.  For patients admitted to the hospital, discharge time is determined by your                treatment team.               Patients discharged the day of surgery will not be allowed to drive  home.  Name and phone number of your driver:   Special Instructions: Shower using CHG 2 nights before surgery and the night before surgery.  If you shower the day of surgery use CHG.  Use special wash - you have one bottle of CHG for all showers.  You should use approximately 1/3 of the bottle for each shower.   Please read over the following fact sheets that you were given: Pain Booklet, Coughing and Deep Breathing and Surgical Site Infection Prevention

## 2013-01-22 NOTE — Telephone Encounter (Signed)
Instructions for coumadin and Lovenox pre surgery.  11/28 - Last dose of coumadin 11/29 - Nothing 11/30 - Lovenox in AM only 12/1 - Lovenox in AM only 12/2 - Lovenox in AM only 12/3 - Lovenox in AM only 12/4 - Procedure (Nothing)  Reduced dosage of 80 mg of Lovenox once daily.  CrCl - 31.6.  Instructed patient to pick up Lovenox and bring to coumadin clinic appointment on 11/25.  Patient verbalized understanding.

## 2013-01-25 ENCOUNTER — Other Ambulatory Visit: Payer: Self-pay

## 2013-01-25 MED ORDER — HYDROCODONE-ACETAMINOPHEN 5-325 MG PO TABS: 1.0000 | ORAL_TABLET | Freq: Four times a day (QID) | ORAL | Status: DC | PRN

## 2013-01-25 NOTE — Progress Notes (Signed)
Pt. Called to state he needs a refill on his pain medication.  Reports most of his pain is in the left foot.  Reports taking the Hydrocodone/ Acetaminophen 5/325 mg, 1 tablet in morning, 1 tablet midday,  and 1 tablet in evening.  Asking for a refill to get by until his surgery on 12/4 for left AKA.  Discussed w/ Dr. Hart Rochester.  Rec'd written Rx for Hydrocodone/Acetaminophen 5/325 mg, take 1 tab q 6hr/ prn/ pain, # 40, no refills.  Pt. notified of prescription ready for pick-up. Verb. Understanding.

## 2013-01-25 NOTE — Progress Notes (Signed)
Anesthesia Chart Review:  Patient is a 78 year old male scheduled for left AKA on 02/04/13 by Dr. Hart Rochester.    History includes PAD with multiple LLE bypass procedures since 09/25/11 with the last being a left FPBG with Gortex on 09/16/12 for early occlusion of previous LLE bypass graft followed by redo left F-AT BPG using composite left arm vein 09/30/12.  He since developed a left groin wound and re-occlusion requiring thrombectomy on 12/16/12 which has since re-occluded.  Patient was becoming more symptomatic of his chronically ischemic LLE and AKA was recommended.   Other history includes CAD s/p mid LAD stent 10/13/06, afib, mixed ischemia/non-ischemic CM, chronic systolic CHF s/p Medtronic BiV ICD 12/25/09, left BBB, HLD, DM2, HTN, PAD with LLE occlusive disease and LUE claudication symptoms secondary to left mid-distal SCA occlusion by duplex 08/2012), CKD and right renal artery stenosis s/p PTA/stent 10/27/06, OSA (failed CPAP) s/p UPPP, ED, BPH gout, depression. He is listed as a non-smoker. PCP is listed as Dr. Gershon Crane.   Primary Cardiologist is Dr. Verdis Prime. He is aware of plans for surgery and has recommended a Lovenox bridge while off Coumadin. EP Cardiologist is Dr. Hillis Range.   Stress test Deboraha Sprang) on 09/13/11 showed normal myocardial perfusion, no ischemia, EF 64%. (Scanned under Media tab, Correspondence, Encounter date 09/30/12.)  Echo on 11/14/09 showed moderate LV dilatation, EF 25-30%, moderate concentric LVH, moderate LAE, mild to moderate MR, AV sclerotic but opens well, mild RV dilatation, mild TR.   EKG on 07/08/12 showed v-paced rhythm.   Cardiac cath on 10/02/06 showed:  1. Severe coronary artery disease with 95-99% mid left anterior descending stenosis. Proximal left anterior descending and left main calcification. There is also severe disease in the nondominant right coronary. The circumflex has moderate disease, but for the most part is widely patent.  2. Global left  ventricular dysfunction with ejection fraction in the 40% range.  3. Severe right renal artery stenosis, approximately 90% demonstrated by selective injection.  4. Hypertension.  He subsequently underwent a LAD and right RA stent in 2008.   Carotid duplex on 08/10/12 showed < 40% bilateral ICA stenosis, antegrade vertebral artery flow, left mid-distal subclavian artery occlusion with a significant brachial blood pressure gradient.    CXR on 04/01/12 showed 08/29/11 showed no acute cardiopulmonary abnormalities, atherosclerosis in the thoracic aorta, left sided AICD. 1V CXR on 10/03/12 showed cardiomegaly without evidence of acute cardiopulmonary disease.  Labs from 01/22/13 noted.  BUN/Cr 44/2.30 which appear around his baseline when compared to labs dating back to September 2014.  Glucose 225. LFTs WNL 12/16/12.  H/H 12.0/36.9. PLT 201K. He will hold Coumadin after 01/29/13 does and start Lovenox bridge on 01/31/13.  He will need a PT/PTT and fasting CBG done on the day of surgery.     He has tolerated multiple vascular procedures over the past year, last within the past two months.  If no acute change in his cardiopulmonary status then I would anticipate that he could proceed as planned.   Velna [facility] Stay [facility]/Anesthesiology Phone [phone] 01/25/2013 1:16 PM

## 2013-01-26 ENCOUNTER — Ambulatory Visit (INDEPENDENT_AMBULATORY_CARE_PROVIDER_SITE_OTHER): Payer: Medicare Other | Admitting: Family

## 2013-01-26 DIAGNOSIS — I4891 Unspecified atrial fibrillation: Secondary | ICD-10-CM

## 2013-01-26 LAB — POCT INR: INR: 2.8

## 2013-01-26 NOTE — Patient Instructions (Signed)
Continue to take 1/2 tab daily, except none on Thursday. Recheck in 4 weeks.  Anticoagulation Dose Instructions as of 01/26/2013     [name] Tue Wed Thu Fri Sat   New Dose 2.5 mg 2.5 mg 2.5 mg 2.5 mg 0 mg 2.5 mg 2.5 mg    Description       Continue to take 1/2 tab daily, except none on Thursday. Recheck in 4 weeks.

## 2013-02-01 ENCOUNTER — Ambulatory Visit (INDEPENDENT_AMBULATORY_CARE_PROVIDER_SITE_OTHER): Payer: Medicare Other | Admitting: Interventional Cardiology

## 2013-02-01 ENCOUNTER — Other Ambulatory Visit: Payer: Self-pay

## 2013-02-01 ENCOUNTER — Encounter: Payer: Self-pay | Admitting: Interventional Cardiology

## 2013-02-01 VITALS — BP 112/68 | HR 72 | Ht 68.0 in | Wt 177.0 lb

## 2013-02-01 DIAGNOSIS — Z01810 Encounter for preprocedural cardiovascular examination: Secondary | ICD-10-CM

## 2013-02-01 DIAGNOSIS — Z7901 Long term (current) use of anticoagulants: Secondary | ICD-10-CM | POA: Insufficient documentation

## 2013-02-01 DIAGNOSIS — I5022 Chronic systolic (congestive) heart failure: Secondary | ICD-10-CM

## 2013-02-01 DIAGNOSIS — I2510 Atherosclerotic heart disease of native coronary artery without angina pectoris: Secondary | ICD-10-CM

## 2013-02-01 DIAGNOSIS — I739 Peripheral vascular disease, unspecified: Secondary | ICD-10-CM

## 2013-02-01 DIAGNOSIS — I4891 Unspecified atrial fibrillation: Secondary | ICD-10-CM

## 2013-02-01 DIAGNOSIS — I10 Essential (primary) hypertension: Secondary | ICD-10-CM

## 2013-02-01 DIAGNOSIS — I509 Heart failure, unspecified: Secondary | ICD-10-CM

## 2013-02-01 DIAGNOSIS — E785 Hyperlipidemia, unspecified: Secondary | ICD-10-CM

## 2013-02-01 MED ORDER — OXYCODONE-ACETAMINOPHEN 5-325 MG PO TABS: ORAL_TABLET | ORAL | Status: DC

## 2013-02-01 NOTE — Progress Notes (Signed)
Phone call from pt's son, Maisie Fus.  Reports that the Hydrocodone / Acetaminophen is not helping pt's pain in left foot.  States he can't sleep at night, and is getting very little rest.  Discussed with Dr. Hart Rochester.  Rec'd verbal order for Oxycodone/ Acetaminophen 5/325 mg 1-2 tabs q 4 hrs/ prn/ pain; # 30; no refills.  Notified pt. that Rx will be available to pick up at office for Oxycodone/ Acetaminophen.

## 2013-02-01 NOTE — Patient Instructions (Signed)
Your physician recommends that you continue on your current medications as directed. Please refer to the Current Medication list given to you today.  Your physician wants you to follow-up in: 6 months. You will receive a reminder letter in the mail two months in advance. If you don't receive a letter, please call our office to schedule the follow-up appointment.  

## 2013-02-01 NOTE — Progress Notes (Signed)
Patient ID: KANAV KAZMIERCZAK, male   DOB: [birth year], 78 y.o.   MRN: [id number]    [address], [address] [place], [place]  [address] Phone: [phone] Fax:  [phone]  Date:  02/01/2013   ID:  JAVARIAN JAKUBIAK, DOB 25-May-1934, MRN [id number]  PCP:  Nelwyn Salisbury, MD   ASSESSMENT:  1. Chronic systolic heart failure, stable 2. Coronary artery disease with stable angina 3. Chronic atrial fibrillation, asymptomatic 4. Preoperative cardiovascular evaluation, without evidence of decompensated cardiovascular state. 5. Chronic anticoagulation therapy without complications   PLAN:  1. Cleared for upcoming left AKA, by Dr. Betti Cruz 2. No change in current medical regimen for heart failure, atrial fib, and coronary disease 3. Clinical followup in 6 months   SUBJECTIVE: [name] is a 78 y.o. male who is suffering the ravages of diabetes vascular disease. He is to have a left AKA performed on Thursday by Dr. Betti Cruz. Patient is having severe lower extremity pain. He denies angina, dyspnea, orthopnea, syncope, and cough. He has not needed nitroglycerin. No bleeding on Coumadin.   Wt Readings from Last 3 Encounters:  02/01/13 177 lb (80.287 kg)  01/22/13 176 lb (79.833 kg)  01/21/13 181 lb (82.101 kg)     Past Medical History  Diagnosis Date  . Hyperlipidemia   . Gout   . Benign prostatic hypertrophy   . Atrial fibrillation     sees Dr. Verdis Prime  . Chronic systolic dysfunction of left ventricle     a. mixed ischemic and nonischemic CM,  EF 35%. b. s/p AICD implantation.  . CHF (congestive heart failure)   . ED (erectile dysfunction)   . Arthritis   . Pacemaker     medtronic  . CAD (coronary artery disease)     a. s/p mid LAD stenting with DES 2008 Dr. Verdis Prime  . ICD (implantable cardiac defibrillator) in place     medtronic, Dr. Johney Frame  . ICD (implantable cardiac defibrillator) in place     due for check in Feb/2013  . Sleep apnea     hx of "had  surgery for"  . Hypertension     sees Dr. Claris Che  . Type II diabetes mellitus   . Automatic implantable cardioverter-defibrillator in situ   . Depression   . Numbness and tingling     Hx; 38f left foot  . PAD (peripheral artery disease)     Severe by PV angiogram 09/2011  . Renal artery stenosis     s/p stenting 2009  . PAD (peripheral artery disease)     s/p multiple LLE bypass grafts; left mid-distal SCA occlusion by 08/2012 duplex    Current Outpatient Prescriptions  Medication Sig Dispense Refill  . atorvastatin (LIPITOR) 10 MG tablet Take 1 tablet (10 mg total) by mouth daily.  30 tablet  4  . carvedilol (COREG) 6.25 MG tablet Take 1 tablet (6.25 mg total) by mouth 2 (two) times daily with a meal.  180 tablet  3  . cephALEXin (KEFLEX) 500 MG capsule Take 1 capsule (500 mg total) by mouth 3 (three) times daily.  63 capsule  0  . cephALEXin (KEFLEX) 500 MG capsule Take 500 mg by mouth 3 (three) times daily. Started 11-20; 10 day course      . enoxaparin (LOVENOX) 80 MG/0.8ML injection Inject 0.8 mLs (80 mg total) into the skin daily.  10 Syringe  0  . finasteride (PROSCAR) 5 MG tablet Take 5 mg by  mouth daily.      . furosemide (LASIX) 40 MG tablet Take 1 tablet (40 mg total) by mouth daily.  90 tablet  3  . gabapentin (NEURONTIN) 300 MG capsule Take 300-600 mg by mouth 2 (two) times daily. Takes 300 mg (1 capsule) in the morning and 600 mg (2 capsules) at night      . HYDROcodone-acetaminophen (NORCO/VICODIN) 5-325 MG per tablet Take 1 tablet by mouth every 6 (six) hours as needed.  40 tablet  0  . insulin aspart protamine-insulin aspart (NOVOLOG 70/30) (70-30) 100 UNIT/ML injection Inject 10 Units into the skin 2 (two) times daily with a meal. 10 units with breakfast, and 10 units with the evening meal      . losartan (COZAAR) 100 MG tablet Take 1 tablet (100 mg total) by mouth daily.  90 tablet  3  . NITROSTAT 0.4 MG SL tablet Place 0.4 mg under the tongue every 5 (five) minutes as  needed for chest pain. For chest pain, max 3 doses      . oxyCODONE-acetaminophen (PERCOCET/ROXICET) 5-325 MG per tablet Take 1-2 tablets every 4-6 hrs./ prn for severe pain  30 tablet  0  . warfarin (COUMADIN) 5 MG tablet Take 2.5 mg by mouth See admin instructions. Takes 2.5 mg daily EXCEPT for none on Thursday       No current facility-administered medications for this visit.    Allergies:    Allergies  Allergen Reactions  . Other Other (See Comments)    Plastic tape tears skin    Social History:  The patient  reports that he has never smoked. He has never used smokeless tobacco. He reports that he drinks about 2.4 ounces of alcohol per week. He reports that he does not use illicit drugs.   ROS:  Please see the history of present illness.   Denies transient neurological symptoms. His been depressed (according to his son). No bleeding on Coumadin. No significant episodes of angina or dyspnea. Taking medications as scheduled.   All other systems reviewed and negative.   OBJECTIVE: VS:  BP 112/68  Pulse 72  Ht 5\' 8"  (1.727 m)  Wt 177 lb (80.287 kg)  BMI 26.92 kg/m2 Well nourished, well developed, in no acute distress, significant weight loss over past 6 months HEENT: normal Neck: JVD flat. Carotid bruit faint bilateral  Cardiac:  normal S1, S2; IIRR; 2 of 6 apical systolic murmur Lungs:  clear to auscultation bilaterally, no wheezing, rhonchi or rales Abd: soft, nontender, no hepatomegaly Ext: Edema trace left lower extremity. Pulses absent left foot. Cool. Nonhealing ulcer left lower extremity with eschar. Also eschar in the lateral left knee area. Skin: warm and dry Neuro:  CNs 2-12 intact, no focal abnormalities noted  EKG:  Atrial fibrillation with ventricular pacing at 72 beats per minute. No change from prior tracing       Signed, Darci Needle III, MD 02/01/2013 3:29 PM

## 2013-02-03 MED ORDER — DEXTROSE 5 % IV SOLN
1.5000 g | INTRAVENOUS | Status: AC
  Administered 2013-02-04: 1.5 g via INTRAVENOUS
  Filled 2013-02-03: qty 1.5

## 2013-02-04 ENCOUNTER — Inpatient Hospital Stay (HOSPITAL_COMMUNITY): Payer: Medicare Other | Admitting: Anesthesiology

## 2013-02-04 ENCOUNTER — Encounter (HOSPITAL_COMMUNITY): Payer: Self-pay | Admitting: *Deleted

## 2013-02-04 ENCOUNTER — Telehealth: Payer: Self-pay | Admitting: Vascular Surgery

## 2013-02-04 ENCOUNTER — Encounter (HOSPITAL_COMMUNITY): Payer: Medicare Other | Admitting: Vascular Surgery

## 2013-02-04 ENCOUNTER — Encounter (HOSPITAL_COMMUNITY)
Admission: RE | Disposition: A | Discharge status: Other Acute Inpatient Hospital | Payer: Self-pay | Source: Ambulatory Visit | Attending: Vascular Surgery

## 2013-02-04 ENCOUNTER — Inpatient Hospital Stay (HOSPITAL_COMMUNITY)
Admission: RE | Admit: 2013-02-04 | Discharge: 2013-02-09 | DRG: 240 | Disposition: A | Discharge status: Other Acute Inpatient Hospital | Payer: Medicare Other | Source: Ambulatory Visit | Attending: Vascular Surgery | Admitting: Vascular Surgery

## 2013-02-04 DIAGNOSIS — D62 Acute posthemorrhagic anemia: Secondary | ICD-10-CM | POA: Diagnosis not present

## 2013-02-04 DIAGNOSIS — L97409 Non-pressure chronic ulcer of unspecified heel and midfoot with unspecified severity: Secondary | ICD-10-CM | POA: Diagnosis present

## 2013-02-04 DIAGNOSIS — I999 Unspecified disorder of circulatory system: Secondary | ICD-10-CM

## 2013-02-04 DIAGNOSIS — E785 Hyperlipidemia, unspecified: Secondary | ICD-10-CM | POA: Diagnosis present

## 2013-02-04 DIAGNOSIS — E119 Type 2 diabetes mellitus without complications: Secondary | ICD-10-CM | POA: Diagnosis present

## 2013-02-04 DIAGNOSIS — M62269 Nontraumatic ischemic infarction of muscle, unspecified lower leg: Secondary | ICD-10-CM | POA: Diagnosis present

## 2013-02-04 DIAGNOSIS — L98499 Non-pressure chronic ulcer of skin of other sites with unspecified severity: Principal | ICD-10-CM | POA: Diagnosis present

## 2013-02-04 DIAGNOSIS — I10 Essential (primary) hypertension: Secondary | ICD-10-CM | POA: Diagnosis present

## 2013-02-04 DIAGNOSIS — Z9861 Coronary angioplasty status: Secondary | ICD-10-CM

## 2013-02-04 DIAGNOSIS — N400 Enlarged prostate without lower urinary tract symptoms: Secondary | ICD-10-CM | POA: Diagnosis present

## 2013-02-04 DIAGNOSIS — I701 Atherosclerosis of renal artery: Secondary | ICD-10-CM | POA: Diagnosis present

## 2013-02-04 DIAGNOSIS — Z7901 Long term (current) use of anticoagulants: Secondary | ICD-10-CM

## 2013-02-04 DIAGNOSIS — Z794 Long term (current) use of insulin: Secondary | ICD-10-CM

## 2013-02-04 DIAGNOSIS — I509 Heart failure, unspecified: Secondary | ICD-10-CM | POA: Diagnosis present

## 2013-02-04 DIAGNOSIS — I739 Peripheral vascular disease, unspecified: Principal | ICD-10-CM | POA: Diagnosis present

## 2013-02-04 DIAGNOSIS — Z95810 Presence of automatic (implantable) cardiac defibrillator: Secondary | ICD-10-CM

## 2013-02-04 DIAGNOSIS — I5022 Chronic systolic (congestive) heart failure: Secondary | ICD-10-CM | POA: Diagnosis present

## 2013-02-04 DIAGNOSIS — I4891 Unspecified atrial fibrillation: Secondary | ICD-10-CM | POA: Diagnosis present

## 2013-02-04 DIAGNOSIS — I2510 Atherosclerotic heart disease of native coronary artery without angina pectoris: Secondary | ICD-10-CM | POA: Diagnosis present

## 2013-02-04 HISTORY — PX: AMPUTATION: SHX166

## 2013-02-04 HISTORY — PX: LEG AMPUTATION ABOVE KNEE: SHX117

## 2013-02-04 LAB — PROTIME-INR: INR: 1.1 (ref 0.00–1.49)

## 2013-02-04 LAB — APTT: aPTT: 34 seconds (ref 24–37)

## 2013-02-04 SURGERY — AMPUTATION, ABOVE KNEE
Anesthesia: General | Site: Leg Upper | Laterality: Left

## 2013-02-04 MED ORDER — MIDAZOLAM HCL 5 MG/5ML IJ SOLN
INTRAMUSCULAR | Status: DC | PRN
  Administered 2013-02-04: 1 mg via INTRAVENOUS

## 2013-02-04 MED ORDER — CEPHALEXIN 500 MG PO CAPS
500.0000 mg | ORAL_CAPSULE | Freq: Three times a day (TID) | ORAL | Status: DC
  Administered 2013-02-05 – 2013-02-09 (×12): 500 mg via ORAL
  Filled 2013-02-04 (×13): qty 1

## 2013-02-04 MED ORDER — FUROSEMIDE 40 MG PO TABS
40.0000 mg | ORAL_TABLET | Freq: Every day | ORAL | Status: DC
  Administered 2013-02-04 – 2013-02-09 (×6): 40 mg via ORAL
  Filled 2013-02-04 (×6): qty 1

## 2013-02-04 MED ORDER — PANTOPRAZOLE SODIUM 40 MG PO TBEC
40.0000 mg | DELAYED_RELEASE_TABLET | Freq: Every day | ORAL | Status: DC
  Administered 2013-02-05 – 2013-02-09 (×2): 40 mg via ORAL
  Filled 2013-02-04 (×4): qty 1

## 2013-02-04 MED ORDER — SODIUM CHLORIDE 0.9 % IV SOLN
INTRAVENOUS | Status: DC
  Administered 2013-02-04 (×2): via INTRAVENOUS

## 2013-02-04 MED ORDER — OXYCODONE HCL 5 MG PO TABS
5.0000 mg | ORAL_TABLET | Freq: Once | ORAL | Status: AC | PRN
  Administered 2013-02-04: 5 mg via ORAL

## 2013-02-04 MED ORDER — PROPOFOL 10 MG/ML IV BOLUS
INTRAVENOUS | Status: DC | PRN
  Administered 2013-02-04: 125 mg via INTRAVENOUS

## 2013-02-04 MED ORDER — GUAIFENESIN-DM 100-10 MG/5ML PO SYRP: 15.0000 mL | ORAL_SOLUTION | ORAL | Status: DC | PRN

## 2013-02-04 MED ORDER — ENOXAPARIN SODIUM 80 MG/0.8ML ~~LOC~~ SOLN
80.0000 mg | SUBCUTANEOUS | Status: DC
  Administered 2013-02-05 – 2013-02-06 (×2): 80 mg via SUBCUTANEOUS
  Filled 2013-02-04 (×3): qty 0.8

## 2013-02-04 MED ORDER — SODIUM CHLORIDE 0.9 % IV SOLN
INTRAVENOUS | Status: DC
  Administered 2013-02-06: via INTRAVENOUS

## 2013-02-04 MED ORDER — OXYCODONE-ACETAMINOPHEN 5-325 MG PO TABS
1.0000 | ORAL_TABLET | ORAL | Status: DC | PRN
  Administered 2013-02-05 – 2013-02-09 (×16): 1 via ORAL
  Filled 2013-02-04 (×15): qty 1

## 2013-02-04 MED ORDER — HYDRALAZINE HCL 20 MG/ML IJ SOLN: 10.0000 mg | INTRAMUSCULAR | Status: DC | PRN

## 2013-02-04 MED ORDER — HYDROMORPHONE HCL PF 1 MG/ML IJ SOLN
INTRAMUSCULAR | Status: AC
  Administered 2013-02-04: 1 mg via INTRAVENOUS
  Filled 2013-02-04: qty 1

## 2013-02-04 MED ORDER — GABAPENTIN 600 MG PO TABS
600.0000 mg | ORAL_TABLET | Freq: Every day | ORAL | Status: DC
  Administered 2013-02-04 – 2013-02-08 (×5): 600 mg via ORAL
  Filled 2013-02-04 (×6): qty 1

## 2013-02-04 MED ORDER — MAGNESIUM SULFATE 40 MG/ML IJ SOLN
2.0000 g | Freq: Once | INTRAMUSCULAR | Status: AC | PRN
  Filled 2013-02-04: qty 50

## 2013-02-04 MED ORDER — FINASTERIDE 5 MG PO TABS
5.0000 mg | ORAL_TABLET | Freq: Every day | ORAL | Status: DC
  Administered 2013-02-04 – 2013-02-09 (×6): 5 mg via ORAL
  Filled 2013-02-04 (×6): qty 1

## 2013-02-04 MED ORDER — DOCUSATE SODIUM 100 MG PO CAPS
100.0000 mg | ORAL_CAPSULE | Freq: Every day | ORAL | Status: DC
  Administered 2013-02-05 – 2013-02-09 (×5): 100 mg via ORAL
  Filled 2013-02-04 (×6): qty 1

## 2013-02-04 MED ORDER — OXYCODONE HCL 5 MG PO TABS
ORAL_TABLET | ORAL | Status: AC
  Filled 2013-02-04: qty 1

## 2013-02-04 MED ORDER — WARFARIN SODIUM 2.5 MG PO TABS
2.5000 mg | ORAL_TABLET | ORAL | Status: DC
  Administered 2013-02-05 – 2013-02-09 (×5): 2.5 mg via ORAL
  Filled 2013-02-04 (×5): qty 1

## 2013-02-04 MED ORDER — LOSARTAN POTASSIUM 50 MG PO TABS: 100.0000 mg | ORAL_TABLET | Freq: Every day | ORAL | Status: DC

## 2013-02-04 MED ORDER — OXYCODONE HCL 5 MG/5ML PO SOLN: 5.0000 mg | Freq: Once | ORAL | Status: AC | PRN

## 2013-02-04 MED ORDER — INSULIN ASPART PROT & ASPART (70-30 MIX) 100 UNIT/ML ~~LOC~~ SUSP
10.0000 [IU] | Freq: Two times a day (BID) | SUBCUTANEOUS | Status: DC
  Administered 2013-02-04 – 2013-02-09 (×10): 10 [IU] via SUBCUTANEOUS
  Filled 2013-02-04: qty 10

## 2013-02-04 MED ORDER — ONDANSETRON HCL 4 MG/2ML IJ SOLN: 4.0000 mg | Freq: Four times a day (QID) | INTRAMUSCULAR | Status: DC | PRN

## 2013-02-04 MED ORDER — HYDROMORPHONE HCL PF 1 MG/ML IJ SOLN
0.2500 mg | INTRAMUSCULAR | Status: DC | PRN
  Administered 2013-02-04 (×7): 0.5 mg via INTRAVENOUS

## 2013-02-04 MED ORDER — HYDROMORPHONE HCL PF 1 MG/ML IJ SOLN
INTRAMUSCULAR | Status: AC
  Filled 2013-02-04: qty 1

## 2013-02-04 MED ORDER — ACETAMINOPHEN 650 MG RE SUPP: 325.0000 mg | RECTAL | Status: DC | PRN

## 2013-02-04 MED ORDER — NITROGLYCERIN 0.4 MG SL SUBL: 0.4000 mg | SUBLINGUAL_TABLET | SUBLINGUAL | Status: DC | PRN

## 2013-02-04 MED ORDER — CARVEDILOL 6.25 MG PO TABS
6.2500 mg | ORAL_TABLET | Freq: Two times a day (BID) | ORAL | Status: DC
  Administered 2013-02-04 – 2013-02-09 (×11): 6.25 mg via ORAL
  Filled 2013-02-04 (×12): qty 1

## 2013-02-04 MED ORDER — ONDANSETRON HCL 4 MG/2ML IJ SOLN
INTRAMUSCULAR | Status: DC | PRN
  Administered 2013-02-04: 4 mg via INTRAVENOUS

## 2013-02-04 MED ORDER — GABAPENTIN 300 MG PO CAPS
300.0000 mg | ORAL_CAPSULE | Freq: Every day | ORAL | Status: DC
  Administered 2013-02-05 – 2013-02-09 (×5): 300 mg via ORAL
  Filled 2013-02-04 (×5): qty 1

## 2013-02-04 MED ORDER — HYDROMORPHONE HCL PF 1 MG/ML IJ SOLN: 0.5000 mg | INTRAMUSCULAR | Status: DC | PRN

## 2013-02-04 MED ORDER — HYDROMORPHONE HCL PF 1 MG/ML IJ SOLN
0.5000 mg | INTRAMUSCULAR | Status: DC | PRN
  Administered 2013-02-04 – 2013-02-07 (×13): 1 mg via INTRAVENOUS
  Administered 2013-02-08: 0.5 mg via INTRAVENOUS
  Administered 2013-02-08 (×2): 1 mg via INTRAVENOUS
  Filled 2013-02-04 (×16): qty 1

## 2013-02-04 MED ORDER — INSULIN ASPART 100 UNIT/ML ~~LOC~~ SOLN
0.0000 [IU] | Freq: Three times a day (TID) | SUBCUTANEOUS | Status: DC
  Administered 2013-02-04: 2 [IU] via SUBCUTANEOUS
  Administered 2013-02-05: 5 [IU] via SUBCUTANEOUS
  Administered 2013-02-06: 3 [IU] via SUBCUTANEOUS
  Administered 2013-02-07 – 2013-02-09 (×3): 2 [IU] via SUBCUTANEOUS
  Administered 2013-02-09: 3 [IU] via SUBCUTANEOUS

## 2013-02-04 MED ORDER — FENTANYL CITRATE 0.05 MG/ML IJ SOLN
INTRAMUSCULAR | Status: DC | PRN
  Administered 2013-02-04: 25 ug via INTRAVENOUS
  Administered 2013-02-04 (×2): 50 ug via INTRAVENOUS
  Administered 2013-02-04 (×2): 25 ug via INTRAVENOUS
  Administered 2013-02-04: 50 ug via INTRAVENOUS
  Administered 2013-02-04: 150 ug via INTRAVENOUS
  Administered 2013-02-04: 50 ug via INTRAVENOUS
  Administered 2013-02-04: 25 ug via INTRAVENOUS

## 2013-02-04 MED ORDER — CEPHALEXIN 500 MG PO CAPS: 500.0000 mg | ORAL_CAPSULE | Freq: Three times a day (TID) | ORAL | Status: DC

## 2013-02-04 MED ORDER — ACETAMINOPHEN 325 MG PO TABS: 325.0000 mg | ORAL_TABLET | ORAL | Status: DC | PRN

## 2013-02-04 MED ORDER — CARVEDILOL 6.25 MG PO TABS
6.2500 mg | ORAL_TABLET | Freq: Once | ORAL | Status: AC
  Administered 2013-02-04: 6.25 mg via ORAL
  Filled 2013-02-04: qty 1
  Filled 2013-02-04: qty 2

## 2013-02-04 MED ORDER — MIDAZOLAM HCL 2 MG/2ML IJ SOLN: 0.5000 mg | Freq: Once | INTRAMUSCULAR | Status: DC | PRN

## 2013-02-04 MED ORDER — HYDROCODONE-ACETAMINOPHEN 5-325 MG PO TABS: 1.0000 | ORAL_TABLET | Freq: Four times a day (QID) | ORAL | Status: DC | PRN

## 2013-02-04 MED ORDER — METOPROLOL TARTRATE 1 MG/ML IV SOLN: 2.0000 mg | INTRAVENOUS | Status: DC | PRN

## 2013-02-04 MED ORDER — PHENOL 1.4 % MT LIQD
1.0000 | OROMUCOSAL | Status: DC | PRN
  Filled 2013-02-04: qty 177

## 2013-02-04 MED ORDER — ATORVASTATIN CALCIUM 10 MG PO TABS
10.0000 mg | ORAL_TABLET | Freq: Every day | ORAL | Status: DC
  Administered 2013-02-05 – 2013-02-08 (×4): 10 mg via ORAL
  Filled 2013-02-04 (×6): qty 1

## 2013-02-04 MED ORDER — LOSARTAN POTASSIUM 50 MG PO TABS
100.0000 mg | ORAL_TABLET | Freq: Every day | ORAL | Status: DC
  Administered 2013-02-04 – 2013-02-09 (×6): 100 mg via ORAL
  Filled 2013-02-04 (×6): qty 2

## 2013-02-04 MED ORDER — DEXTROSE 5 % IV SOLN
1.5000 g | Freq: Two times a day (BID) | INTRAVENOUS | Status: AC
  Administered 2013-02-04 – 2013-02-05 (×2): 1.5 g via INTRAVENOUS
  Filled 2013-02-04 (×2): qty 1.5

## 2013-02-04 MED ORDER — ALUM & MAG HYDROXIDE-SIMETH 200-200-20 MG/5ML PO SUSP: 15.0000 mL | ORAL | Status: DC | PRN

## 2013-02-04 MED ORDER — MEPERIDINE HCL 25 MG/ML IJ SOLN: 6.2500 mg | INTRAMUSCULAR | Status: DC | PRN

## 2013-02-04 MED ORDER — LIDOCAINE HCL (CARDIAC) 20 MG/ML IV SOLN
INTRAVENOUS | Status: DC | PRN
  Administered 2013-02-04: 40 mg via INTRAVENOUS

## 2013-02-04 MED ORDER — POTASSIUM CHLORIDE CRYS ER 20 MEQ PO TBCR: 20.0000 meq | EXTENDED_RELEASE_TABLET | Freq: Once | ORAL | Status: AC | PRN

## 2013-02-04 MED ORDER — LABETALOL HCL 5 MG/ML IV SOLN
10.0000 mg | INTRAVENOUS | Status: DC | PRN
  Filled 2013-02-04: qty 4

## 2013-02-04 MED ORDER — PROMETHAZINE HCL 25 MG/ML IJ SOLN: 6.2500 mg | INTRAMUSCULAR | Status: DC | PRN

## 2013-02-04 MED ORDER — WARFARIN - PHYSICIAN DOSING INPATIENT: Freq: Every day | Status: DC

## 2013-02-04 MED ORDER — 0.9 % SODIUM CHLORIDE (POUR BTL) OPTIME
TOPICAL | Status: DC | PRN
  Administered 2013-02-04: 1000 mL

## 2013-02-04 SURGICAL SUPPLY — 55 items
BANDAGE ELASTIC 6 VELCRO ST LF (GAUZE/BANDAGES/DRESSINGS) ×2 IMPLANT
BANDAGE ESMARK 6X9 LF (GAUZE/BANDAGES/DRESSINGS) IMPLANT
BANDAGE GAUZE ELAST BULKY 4 IN (GAUZE/BANDAGES/DRESSINGS) ×3 IMPLANT
BLADE SAW RECIP 87.9 MT (BLADE) ×2 IMPLANT
BNDG CMPR 9X6 STRL LF SNTH (GAUZE/BANDAGES/DRESSINGS)
BNDG COHESIVE 6X5 TAN STRL LF (GAUZE/BANDAGES/DRESSINGS) ×2 IMPLANT
BNDG ESMARK 6X9 LF (GAUZE/BANDAGES/DRESSINGS)
CANISTER SUCTION 2500CC (MISCELLANEOUS) ×2 IMPLANT
CLIP TI MEDIUM 6 (CLIP) IMPLANT
CONT SPEC STER OR (MISCELLANEOUS) ×1 IMPLANT
COVER SURGICAL LIGHT HANDLE (MISCELLANEOUS) ×2 IMPLANT
CUFF TOURNIQUET SINGLE 24IN (TOURNIQUET CUFF) IMPLANT
CUFF TOURNIQUET SINGLE 34IN LL (TOURNIQUET CUFF) IMPLANT
CUFF TOURNIQUET SINGLE 44IN (TOURNIQUET CUFF) IMPLANT
DRAPE ORTHO SPLIT 77X108 STRL (DRAPES) ×4
DRAPE PROXIMA HALF (DRAPES) ×2 IMPLANT
DRAPE SURG ORHT 6 SPLT 77X108 (DRAPES) ×2 IMPLANT
DRSG ADAPTIC 3X8 NADH LF (GAUZE/BANDAGES/DRESSINGS) ×2 IMPLANT
DRSG PAD ABDOMINAL 8X10 ST (GAUZE/BANDAGES/DRESSINGS) ×2 IMPLANT
ELECT REM PT RETURN 9FT ADLT (ELECTROSURGICAL) ×2
ELECTRODE REM PT RTRN 9FT ADLT (ELECTROSURGICAL) ×1 IMPLANT
EVACUATOR 1/8 PVC DRAIN (DRAIN) ×2 IMPLANT
GLOVE BIO SURGEON STRL SZ 6.5 (GLOVE) ×2 IMPLANT
GLOVE BIOGEL PI IND STRL 6.5 (GLOVE) IMPLANT
GLOVE BIOGEL PI IND STRL 7.0 (GLOVE) IMPLANT
GLOVE BIOGEL PI IND STRL 8 (GLOVE) IMPLANT
GLOVE BIOGEL PI INDICATOR 6.5 (GLOVE) ×1
GLOVE BIOGEL PI INDICATOR 7.0 (GLOVE) ×1
GLOVE BIOGEL PI INDICATOR 8 (GLOVE) ×1
GLOVE SS BIOGEL STRL SZ 7 (GLOVE) ×1 IMPLANT
GLOVE SUPERSENSE BIOGEL SZ 7 (GLOVE) ×1
GLOVE SURG SS PI 7.5 STRL IVOR (GLOVE) ×1 IMPLANT
GOWN PREVENTION PLUS XLARGE (GOWN DISPOSABLE) ×1 IMPLANT
GOWN STRL NON-REIN LRG LVL3 (GOWN DISPOSABLE) ×6 IMPLANT
KIT BASIN OR (CUSTOM PROCEDURE TRAY) ×2 IMPLANT
KIT ROOM TURNOVER OR (KITS) ×2 IMPLANT
NS IRRIG 1000ML POUR BTL (IV SOLUTION) ×2 IMPLANT
PACK GENERAL/GYN (CUSTOM PROCEDURE TRAY) ×2 IMPLANT
PAD ARMBOARD 7.5X6 YLW CONV (MISCELLANEOUS) ×4 IMPLANT
PADDING CAST COTTON 6X4 STRL (CAST SUPPLIES) IMPLANT
SPONGE GAUZE 4X4 12PLY (GAUZE/BANDAGES/DRESSINGS) ×3 IMPLANT
STAPLER VISISTAT 35W (STAPLE) ×2 IMPLANT
STOCKINETTE IMPERVIOUS LG (DRAPES) ×2 IMPLANT
SUT SILK 2 0 (SUTURE) ×2
SUT SILK 2 0 SH CR/8 (SUTURE) ×4 IMPLANT
SUT SILK 2-0 18XBRD TIE 12 (SUTURE) ×1 IMPLANT
SUT SILK 3 0 (SUTURE) ×2
SUT SILK 3-0 18XBRD TIE 12 (SUTURE) ×1 IMPLANT
SUT VIC AB 2-0 CT1 18 (SUTURE) ×2 IMPLANT
SUT VIC AB 2-0 CT1 27 (SUTURE) ×4
SUT VIC AB 2-0 CT1 TAPERPNT 27 (SUTURE) ×2 IMPLANT
TOWEL OR 17X24 6PK STRL BLUE (TOWEL DISPOSABLE) ×2 IMPLANT
TOWEL OR 17X26 10 PK STRL BLUE (TOWEL DISPOSABLE) ×2 IMPLANT
UNDERPAD 30X30 INCONTINENT (UNDERPADS AND DIAPERS) ×2 IMPLANT
WATER STERILE IRR 1000ML POUR (IV SOLUTION) ×2 IMPLANT

## 2013-02-04 NOTE — Progress Notes (Signed)
Pt's bp elevated. Pt is anxious and hurting. Will treat pain and reevaluate.

## 2013-02-04 NOTE — Telephone Encounter (Addendum)
Message copied by Fredrich Birks on Thu Feb 04, 2013  2:33 PM ------      Message from: Dara Lords      Created: Thu Feb 04, 2013 12:27 PM       S/p left AKA 02/04/13.  F/u in 4 weeks with Dr. Hart Rochester.            Thanks,      Lelon Mast ------  02/04/13: lm for pt re appt, dpm

## 2013-02-04 NOTE — Preoperative (Signed)
Beta Blockers   Reason not to administer Beta Blockers:Not Applicable 

## 2013-02-04 NOTE — Progress Notes (Signed)
Received from PACU. Assessed per flowsheet. Denies chest pain or shortness of breath. VSS. Call bell near.[name]

## 2013-02-04 NOTE — Anesthesia Postprocedure Evaluation (Signed)
  Anesthesia Post-op Note  Patient: [name]  Procedure(s) Performed: Procedure(s): AMPUTATION ABOVE KNEE-LEFT (Left)  Patient Location: PACU  Anesthesia Type:General  Level of Consciousness: awake, alert , oriented and patient cooperative  Airway and Oxygen Therapy: Patient Spontanous Breathing and Patient connected to nasal cannula oxygen  Post-op Pain: mild  Post-op Assessment: Post-op Vital signs reviewed, Patient's Cardiovascular Status Stable, Respiratory Function Stable, Patent Airway, No signs of Nausea or vomiting and Pain level controlled  Post-op Vital Signs: Reviewed and stable  Complications: No apparent anesthesia complications

## 2013-02-04 NOTE — Anesthesia Preprocedure Evaluation (Addendum)
Anesthesia Evaluation  Patient identified by MRN, date of birth, ID band Patient awake    Reviewed: Allergy & Precautions, H&P , NPO status , Patient's Chart, lab work & pertinent test results, reviewed documented beta blocker date and time   History of Anesthesia Complications Negative for: history of anesthetic complications  Airway Mallampati: II TM Distance: >3 FB Neck ROM: full  Mouth opening: Limited Mouth Opening  Dental  (+) Teeth Intact and Dental Advidsory Given   Pulmonary sleep apnea ,  breath sounds clear to auscultation        Cardiovascular hypertension, + CAD, + Cardiac Stents, + Peripheral Vascular Disease (s/p fem-tib, embolectomy) and +CHF + dysrhythmias (INR 1.10) Atrial Fibrillation + pacemaker + Cardiac Defibrillator Rhythm:Regular Rate:Normal  ECHO: EF 30%   Neuro/Psych PSYCHIATRIC DISORDERS  Neuromuscular disease    GI/Hepatic negative GI ROS, Neg liver ROS,   Endo/Other  diabetes (glu 145), Well Controlled, Type 1, Insulin Dependent  Renal/GU Renal InsufficiencyRenal disease (creat 2.30)     Musculoskeletal   Abdominal   Peds  Hematology negative hematology ROS (+)   Anesthesia Other Findings Chronic Renal Insufficiency  Reproductive/Obstetrics                        Anesthesia Physical Anesthesia Plan  ASA: III  Anesthesia Plan: General   Post-op Pain Management:    Induction: Intravenous  Airway Management Planned: LMA  Additional Equipment:   Intra-op Plan:   Post-operative Plan:   Informed Consent:   Dental Advisory Given  Plan Discussed with: Anesthesiologist, CRNA and Surgeon  Anesthesia Plan Comments: (Plan routine monitors, GA- LMA OK)       Anesthesia Quick Evaluation

## 2013-02-04 NOTE — Op Note (Signed)
OPERATIVE REPORT  Date of Surgery: 02/04/2013  Surgeon: [name], MD  Assistant: Lorrine Kin  Pre-op Diagnosis: Ischemia of left leg-non-reconstructable Post-op Diagnosis: Ischemia of left leg  Procedure: Procedure(s): Left above-knee amputation  Drains-2 Hemovac  Anesthesia: General  EBL: 100 cc  The patient was taken to the operating room placed in the supine position at which time satisfactory Gen.-LMA anesthesia was administered. Left lower extremity was prepped with Betadine scrub and solution draped in a routine sterile manner. The lower leg was isolated with an impervious stockinette. Skin flaps were above-knee amputation were marked making the femur as long as possible placing it about 4 inches above the knee joint. Equal anterior and posterior flaps were marked. Incision was carried down through skin and subcutaneous tissue with the scalpel. Muscle was divided with the Bovie. Superficial femoral artery was chronically occluded. The vein and nerve were individually ligated with 2-0 silk ties and ligatures. Femur was cleaned proximally with periosteal elevator divided with the Stryker saw and smoothed with a rasp. Posterior muscles were then divided. An old thrombosed Gore-Tex graft was noted to be present the deep tissues of the leg. It was not incorporated in the tract. It was pulled distally and transected and sent for culture. It was not malodorous. Following this thorough irrigation of the stump was performed and adequate hemostasis achieved. A medium Hemovac drain was brought out medially and laterally and secured with silk sutures. Fascia was then closed with interrupted 2-0 Vicryl  Over the bone subcutaneous tissue with continuous 2-0 Vicryl and the skin with clips sterile dressings applied patient taken to recovery in stable condition  Complications none   Procedure Details:   [name], MD 02/04/2013 9:09 AM

## 2013-02-04 NOTE — Transfer of Care (Signed)
Immediate Anesthesia Transfer of Care Note  Patient: [name]  Procedure(s) Performed: Procedure(s): AMPUTATION ABOVE KNEE-LEFT (Left)  Patient Location: PACU  Anesthesia Type:General  Level of Consciousness: awake, alert  and oriented  Airway & Oxygen Therapy: Patient Spontanous Breathing and Patient connected to nasal cannula oxygen  Post-op Assessment: Report given to PACU RN, Post -op Vital signs reviewed and stable and Patient moving all extremities X 4  Post vital signs: Reviewed and stable  Complications: No apparent anesthesia complications

## 2013-02-04 NOTE — Progress Notes (Signed)
Pt is on Keflex for purulent, serous drainage from the medial aspect of his left leg from 01/21/13.  Will continue ABx until cultures from gortex graft taken during amputation intraoperatively come back.  Doreatha Massed 02/04/2013 9:13 AM

## 2013-02-04 NOTE — Anesthesia Procedure Notes (Signed)
Procedure Name: LMA Insertion Date/Time: 02/04/2013 7:53 AM Performed by: Carmela Rima Pre-anesthesia Checklist: Patient identified, Timeout performed, Emergency Drugs available, Patient being monitored and Suction available Patient Re-evaluated:Patient Re-evaluated prior to inductionOxygen Delivery Method: Circle system utilized Preoxygenation: Pre-oxygenation with 100% oxygen Intubation Type: IV induction Ventilation: Mask ventilation without difficulty LMA: LMA inserted LMA Size: 4.0 Placement Confirmation: positive ETCO2 Tube secured with: Tape Dental Injury: Teeth and Oropharynx as per pre-operative assessment

## 2013-02-04 NOTE — Interval H&P Note (Signed)
History and Physical Interval Note:  02/04/2013 7:38 AM  [name]  has presented today for surgery, with the diagnosis of Ischemia of left leg  The various methods of treatment have been discussed with the patient and family. After consideration of risks, benefits and other options for treatment, the patient has consented to  Procedure(s): AMPUTATION ABOVE KNEE-LEFT (Left) as a surgical intervention .  The patient's history has been reviewed, patient examined, no change in status, stable for surgery.  I have reviewed the patient's chart and labs.  Questions were answered to the patient's satisfaction.     [name]

## 2013-02-04 NOTE — Progress Notes (Signed)
ANTICOAGULATION CONSULT NOTE - Initial Consult  Pharmacy Consult for lovenox Indication: atrial fibrillation  Allergies  Allergen Reactions  . Other Other (See Comments)    Plastic tape tears skin    Patient Measurements:   Heparin Dosing Weight:   Vital Signs: Temp: 97.6 F (36.4 C) (12/04 1244) Temp src: Oral (12/04 0551) BP: 163/60 mmHg (12/04 1244) Pulse Rate: 70 (12/04 1244)  Labs:  Recent Labs  02/04/13 0602  APTT 34  LABPROT 14.0  INR 1.10    The CrCl is unknown because both a height and weight (above a minimum accepted value) are required for this calculation.   Medical History: Past Medical History  Diagnosis Date  . Hyperlipidemia   . Gout   . Benign prostatic hypertrophy   . Atrial fibrillation     sees Dr. Verdis Prime  . Chronic systolic dysfunction of left ventricle     a. mixed ischemic and nonischemic CM,  EF 35%. b. s/p AICD implantation.  . CHF (congestive heart failure)   . ED (erectile dysfunction)   . Arthritis   . Pacemaker     medtronic  . CAD (coronary artery disease)     a. s/p mid LAD stenting with DES 2008 Dr. Verdis Prime  . ICD (implantable cardiac defibrillator) in place     medtronic, Dr. Johney Frame  . ICD (implantable cardiac defibrillator) in place     due for check in Feb/2013  . Sleep apnea     hx of "had surgery for"  . Hypertension     sees Dr. Claris Che  . Type II diabetes mellitus   . Automatic implantable cardioverter-defibrillator in situ   . Depression   . Numbness and tingling     Hx; 11f left foot  . PAD (peripheral artery disease)     Severe by PV angiogram 09/2011  . Renal artery stenosis     s/p stenting 2009  . PAD (peripheral artery disease)     s/p multiple LLE bypass grafts; left mid-distal SCA occlusion by 08/2012 duplex    Medications:  Prescriptions prior to admission  Medication Sig Dispense Refill  . atorvastatin (LIPITOR) 10 MG tablet Take 1 tablet (10 mg total) by mouth daily.  30 tablet  4  .  carvedilol (COREG) 6.25 MG tablet Take 1 tablet (6.25 mg total) by mouth 2 (two) times daily with a meal.  180 tablet  3  . cephALEXin (KEFLEX) 500 MG capsule Take 1 capsule (500 mg total) by mouth 3 (three) times daily.  63 capsule  0  . cephALEXin (KEFLEX) 500 MG capsule Take 500 mg by mouth 3 (three) times daily. Started 11-20; 10 day course      . enoxaparin (LOVENOX) 80 MG/0.8ML injection Inject 0.8 mLs (80 mg total) into the skin daily.  10 Syringe  0  . finasteride (PROSCAR) 5 MG tablet Take 5 mg by mouth daily.      . furosemide (LASIX) 40 MG tablet Take 1 tablet (40 mg total) by mouth daily.  90 tablet  3  . gabapentin (NEURONTIN) 300 MG capsule Take 300-600 mg by mouth 2 (two) times daily. Takes 300 mg (1 capsule) in the morning and 600 mg (2 capsules) at night      . HYDROcodone-acetaminophen (NORCO/VICODIN) 5-325 MG per tablet Take 1 tablet by mouth every 6 (six) hours as needed.  40 tablet  0  . insulin aspart protamine-insulin aspart (NOVOLOG 70/30) (70-30) 100 UNIT/ML injection Inject 10 Units into the  skin 2 (two) times daily with a meal. 10 units with breakfast, and 10 units with the evening meal      . losartan (COZAAR) 100 MG tablet Take 1 tablet (100 mg total) by mouth daily.  90 tablet  3  . NITROSTAT 0.4 MG SL tablet Place 0.4 mg under the tongue every 5 (five) minutes as needed for chest pain. For chest pain, max 3 doses      . oxyCODONE-acetaminophen (PERCOCET/ROXICET) 5-325 MG per tablet Take 1-2 tablets every 4-6 hrs./ prn for severe pain  30 tablet  0  . warfarin (COUMADIN) 5 MG tablet Take 2.5 mg by mouth See admin instructions. Takes 2.5 mg daily EXCEPT for none on Thursday        Assessment: 78 yom s/p L AKA to restart on lovenox tomorrow. He is on chronic coumadin for afib which had been held. That will be restarted tonight per MD dosing. INR today is subtherapeutic as expected since pt has been off coumadin pre-operatively. Last CBC as of 11/21 showed H/H 12/36.9 and  plts 201. Of note pt has CKD with estimated CrCl <38ml/min.   Goal of Therapy:  Anti-Xa level 0.6-1.2 units/ml 4hrs after LMWH dose given Monitor platelets by anticoagulation protocol: Yes   Plan:  1. Lovenox 80mg  SQ Q24H starting tomorrow 2. CBC Q72H while on lovenox 3. Daily INR 4. Will follow along with MD dosing coumadin - consider consult pharmacy  Emelina Hinch, Drake Leach 02/04/2013,12:58 PM

## 2013-02-04 NOTE — Progress Notes (Signed)
Pt cont to c/o severe pain and cont to be anxious with elevated BP. Dr. Jean Rosenthal notified. More Dilaudid ordered. Will give and cont to monitor

## 2013-02-04 NOTE — H&P (View-Only) (Signed)
Subjective:     Patient ID: [name], male   DOB: 26-Nov-1934, 78 y.o.   MRN: [id number]  HPI this 78 year old male returns for continued followup regarding his ischemic left leg. He is not reconstruct was had multiple attempts at revascularization. He is about ready to proceed with left above-knee amputation with chronic numbness and some pain in the left foot.  Past Medical History  Diagnosis Date  . Hyperlipidemia   . Gout   . Benign prostatic hypertrophy   . Atrial fibrillation     sees Dr. Verdis Prime  . Chronic systolic dysfunction of left ventricle     a. mixed ischemic and nonischemic CM,  EF 35%. b. s/p AICD implantation.  . CHF (congestive heart failure)   . ED (erectile dysfunction)   . Arthritis   . Pacemaker     medtronic  . CAD (coronary artery disease)     a. s/p mid LAD stenting with DES 2008 Dr. Verdis Prime  . ICD (implantable cardiac defibrillator) in place     medtronic, Dr. Johney Frame  . ICD (implantable cardiac defibrillator) in place     due for check in Feb/2013  . Sleep apnea     hx of "had surgery for"  . Hypertension     sees Dr. Claris Che  . Type II diabetes mellitus   . Automatic implantable cardioverter-defibrillator in situ   . Depression   . Numbness and tingling     Hx; 67f left foot  . PAD (peripheral artery disease)     Severe by PV angiogram 09/2011  . Renal artery stenosis     s/p stenting 2009  . PAD (peripheral artery disease)     s/p multiple LLE bypass grafts; left mid-distal SCA occlusion by 08/2012 duplex    History  Substance Use Topics  . Smoking status: Never Smoker   . Smokeless tobacco: Never Used     Comment: 1-2 cigars when golfing  . Alcohol Use: 2.4 oz/week    2 Glasses of wine, 2 Shots of liquor per week     Comment: 07/10/2012 "galss of wine or vodka tonic 3 X/wk"    Family History  Problem Relation Age of Onset  . Cancer      breast/fhx  . Heart disease      fhx  . Diabetes Neg Hx   . Cancer Father      Allergies  Allergen Reactions  . Other Other (See Comments)    Plastic tape tears skin    Current outpatient prescriptions:allopurinol (ZYLOPRIM) 100 MG tablet, Take 100 mg by mouth daily., Disp: , Rfl: ;  atorvastatin (LIPITOR) 10 MG tablet, Take 1 tablet (10 mg total) by mouth daily., Disp: 30 tablet, Rfl: 4;  carvedilol (COREG) 6.25 MG tablet, Take 1 tablet (6.25 mg total) by mouth 2 (two) times daily with a meal., Disp: 180 tablet, Rfl: 3;  finasteride (PROSCAR) 5 MG tablet, Take 5 mg by mouth daily., Disp: , Rfl:  furosemide (LASIX) 40 MG tablet, Take 1 tablet (40 mg total) by mouth daily., Disp: 90 tablet, Rfl: 3;  gabapentin (NEURONTIN) 300 MG capsule, Take 300-600 mg by mouth 2 (two) times daily. Takes 300 mg (1 capsule) in the morning and 600 mg (2 capsules) at night, Disp: , Rfl:  insulin aspart protamine-insulin aspart (NOVOLOG 70/30) (70-30) 100 UNIT/ML injection, Inject 10 Units into the skin 2 (two) times daily with a meal. 10 units with breakfast, and 10 units with the evening meal,  Disp: , Rfl: ;  losartan (COZAAR) 100 MG tablet, Take 1 tablet (100 mg total) by mouth daily., Disp: 90 tablet, Rfl: 3 NITROSTAT 0.4 MG SL tablet, Place 0.4 mg under the tongue every 5 (five) minutes as needed for chest pain. For chest pain, max 3 doses, Disp: , Rfl: ;  oxyCODONE-acetaminophen (PERCOCET) 10-325 MG per tablet, Take 1-2 tablets by mouth every 4 (four) hours as needed for pain., Disp: 60 tablet, Rfl: 0;  oxyCODONE-acetaminophen (PERCOCET/ROXICET) 5-325 MG per tablet, Take 1-2 tablets by mouth every 4 (four) hours as needed., Disp: 30 tablet, Rfl: 0 warfarin (COUMADIN) 5 MG tablet, Take 0-2.5 mg by mouth daily. Takes 2.5 mg daily EXCEPT for none on Thursday, Disp: , Rfl:   BP 145/63  Pulse 80  Temp(Src) 98.1 F (36.7 C) (Oral)  Resp 18  Ht 5\' 8"  (1.727 m)  Wt 180 lb 9.6 oz (81.92 kg)  BMI 27.47 kg/m2  SpO2 95%  Body mass index is 27.47 kg/(m^2).        Review of Systems  denies chest pain, dyspnea on exertion, PND, orthopnea, hemoptysis. All other systems negative and complete review of    Objective:   Physical Exam BP 145/63  Pulse 80  Temp(Src) 98.1 F (36.7 C) (Oral)  Resp 18  Ht 5\' 8"  (1.727 m)  Wt 180 lb 9.6 oz (81.92 kg)  BMI 27.47 kg/m2  SpO2 95%  Gen.-alert and oriented x3 in no apparent distress HEENT normal for age Lungs no rhonchi or wheezing Cardiovascular regular rhythm no murmurs carotid pulses 3+ palpable no bruits audible Abdomen soft nontender no palpable masses Musculoskeletal free of  major deformities Skin clear -no rashes Neurologic normal Lower extremities 3+ femoral pulses bilaterally. Left leg with nonhealing wound below the knee laterally were femoral-tibial graft was thrombectomized recently. This one is 3 x 2 cm. It is not infected. There is also a dry eschar in the pretibial region on the left. Small ulcer on the left heel. No cellulitis or infection. Right leg has 3+ femoral pulse and well-perfused right foot       Assessment:     Chronic ischemia left foot with failed revascularizations-patient ready to proceed with left AKA in early December Patient on chronic anticoagulations for atrial fibrillation with pacemaker-defibrillator in place    Plan:     #1 hold Coumadin following November 28 dose #2 plan left AKA on Thursday, December 4-wrist and benefits thoroughly discussed and patient would like to proceed

## 2013-02-05 ENCOUNTER — Encounter (HOSPITAL_COMMUNITY): Payer: Self-pay | Admitting: Vascular Surgery

## 2013-02-05 DIAGNOSIS — I739 Peripheral vascular disease, unspecified: Secondary | ICD-10-CM

## 2013-02-05 DIAGNOSIS — S78119A Complete traumatic amputation at level between unspecified hip and knee, initial encounter: Secondary | ICD-10-CM

## 2013-02-05 DIAGNOSIS — L98499 Non-pressure chronic ulcer of skin of other sites with unspecified severity: Secondary | ICD-10-CM

## 2013-02-05 LAB — BASIC METABOLIC PANEL
CO2: 23 mEq/L (ref 19–32)
Chloride: 106 mEq/L (ref 96–112)
Glucose, Bld: 119 mg/dL — ABNORMAL HIGH (ref 70–99)
Potassium: 4.5 mEq/L (ref 3.5–5.1)
Sodium: 140 mEq/L (ref 135–145)

## 2013-02-05 LAB — GLUCOSE, CAPILLARY
Glucose-Capillary: 116 mg/dL — ABNORMAL HIGH (ref 70–99)
Glucose-Capillary: 121 mg/dL — ABNORMAL HIGH (ref 70–99)
Glucose-Capillary: 227 mg/dL — ABNORMAL HIGH (ref 70–99)

## 2013-02-05 LAB — CBC
HCT: 34.5 % — ABNORMAL LOW (ref 39.0–52.0)
Hemoglobin: 11 g/dL — ABNORMAL LOW (ref 13.0–17.0)
MCH: 26.4 pg (ref 26.0–34.0)
MCV: 82.7 fL (ref 78.0–100.0)
RBC: 4.17 MIL/uL — ABNORMAL LOW (ref 4.22–5.81)
RDW: 15.4 % (ref 11.5–15.5)
WBC: 9.3 10*3/uL (ref 4.0–10.5)

## 2013-02-05 LAB — PROTIME-INR: INR: 1.08 (ref 0.00–1.49)

## 2013-02-05 NOTE — Consult Note (Signed)
Physical Medicine and Rehabilitation Consult Reason for Consult: Left AKA Referring Physician: Dr. Hart Rochester   HPI: [name] is a 78 y.o. right-handed male  atrial fibrillation with chronic Coumadin, CAD with stenting as well as pacemaker/ICD, chronic systolic congestive heart failure and renal artery stenosis with stenting. Patient lives alone and was independent prior to admission. Presented 02/04/2013 with ongoing ischemic changes left lower extremity and increasing rest pain as well his failed revascularization procedures in the past. Underwent left AKA 02/04/2013 per Dr. Hart Rochester. Coumadin initially held for procedure and since  resumed. Postoperative pain management. Physical and occupational therapy evaluations pending. M.D. is requested physical medicine rehabilitation consult to consider inpatient rehabilitation services.    Review of Systems  Cardiovascular: Positive for palpitations and leg swelling.  Genitourinary: Positive for urgency.  Musculoskeletal: Positive for joint pain and myalgias.  Neurological: Positive for tingling and weakness.  Psychiatric/Behavioral: Positive for depression.  All other systems reviewed and are negative.   Past Medical History  Diagnosis Date  . Hyperlipidemia   . Gout   . Benign prostatic hypertrophy   . Atrial fibrillation     sees Dr. Verdis Prime  . Chronic systolic dysfunction of left ventricle     a. mixed ischemic and nonischemic CM,  EF 35%. b. s/p AICD implantation.  . CHF (congestive heart failure)   . ED (erectile dysfunction)   . Arthritis   . Pacemaker     medtronic  . CAD (coronary artery disease)     a. s/p mid LAD stenting with DES 2008 Dr. Verdis Prime  . ICD (implantable cardiac defibrillator) in place     medtronic, Dr. Johney Frame  . ICD (implantable cardiac defibrillator) in place     due for check in Feb/2013  . Sleep apnea     hx of "had surgery for"  . Hypertension     sees Dr. Claris Che  . Type II diabetes mellitus    . Automatic implantable cardioverter-defibrillator in situ   . Depression   . Numbness and tingling     Hx; 15f left foot  . PAD (peripheral artery disease)     Severe by PV angiogram 09/2011  . Renal artery stenosis     s/p stenting 2009  . PAD (peripheral artery disease)     s/p multiple LLE bypass grafts; left mid-distal SCA occlusion by 08/2012 duplex   Past Surgical History  Procedure Laterality Date  . Turp vaporization    . Cardiac defibrillator placement  12/26/09    pacemaker combo  . Cervical epidural injection  2013  . Femoral-tibial bypass graft  09/25/2011    Procedure: BYPASS GRAFT FEMORAL-TIBIAL ARTERY;  Surgeon: Pryor Ochoa, MD;  Location: [facility] OR;  Service: Vascular;  Laterality: Left;  Left Femoral - Anterior Tibial Bypass;  saphenous vein graft from left leg  . Intraoperative arteriogram  09/25/2011    Procedure: INTRA OPERATIVE ARTERIOGRAM;  Surgeon: Pryor Ochoa, MD;  Location: [facility] OR;  Service: Vascular;  Laterality: Left;  . Femoral-tibial bypass graft  02/07/2012    Procedure: BYPASS GRAFT FEMORAL-TIBIAL ARTERY;  Surgeon: Larina Earthly, MD;  Location: [facility] OR;  Service: Vascular;  Laterality: Left;  Thrombectomy Left Femoral - Tibial Bypass Graft  . Embolectomy  02/07/2012    Procedure: EMBOLECTOMY;  Surgeon: Larina Earthly, MD;  Location: [facility] OR;  Service: Vascular;  Laterality: Left;  . Femoral-tibial bypass graft  04/03/2012    Procedure: BYPASS GRAFT FEMORAL-TIBIAL ARTERY;  Surgeon: Quita Skye  Hart Rochester, MD;  Location: [facility] OR;  Service: Vascular;  Laterality: Left;  Redo  . Insert / replace / remove pacemaker  2007  . Coronary angioplasty with stent placement  ~ 2000  . Coronary angioplasty    . Uvulopalatopharyngoplasty (uppp)/tonsillectomy/septoplasty  06/30/2003    [name] 06/30/2003 (07/10/2012)  . Shoulder open rotator cuff repair Right 2001    repair of lacerated right/notes 10/11/1999  (07/10/2012)  . Foot surgery Right 03/20/2001    "have plates and screws in; didn't  break it" (07/10/2012)  . Carpal tunnel release Right 2002    [name] 03/20/2001 (07/10/2012)  . Biceps tendon repair Right 2001    [name] 03/20/2001 (07/10/2012)  . Renal artery stent  2009  . Femoral-popliteal bypass graft Left 09/16/2012    Procedure: LEFT FEMORAL-POPLITEAL BYPASS GRAFT WITH GORTEX Propaten Graft 6x80 Thin Wall and Left lower leg Angiogram;  Surgeon: Pryor Ochoa, MD;  Location: [facility] OR;  Service: Vascular;  Laterality: Left;  . Colonoscopy      Hx; of  . Tonsillectomy    . Adenoidectomy      Hx; of   . Femoral-tibial bypass graft Left 09/30/2012    Procedure: REDO LEFT FEMORAL-ANTERIOR TIBIAL ARTERY BYPASS USING COMPOSITE CEPHALIC AND BASILIC VEIN GRAFT FROM LEFT ARM;  Surgeon: Pryor Ochoa, MD;  Location: [facility] OR;  Service: Vascular;  Laterality: Left;  . I&d extremity Left 10/21/2012    Procedure: EXPLORATION AND DEBRIDEMENT OF LEFT GROIN WOUND;  Surgeon: Pryor Ochoa, MD;  Location: [facility] OR;  Service: Vascular;  Laterality: Left;  . Patch angioplasty Left 10/21/2012    Procedure: PATCH ANGIOPLASTY;  Surgeon: Pryor Ochoa, MD;  Location: [facility] OR;  Service: Vascular;  Laterality: Left;  . Groin debridement Left 11/12/2012    Procedure: CLOSURE INGUINAL WOUND;  Surgeon: Pryor Ochoa, MD;  Location: [facility] OR;  Service: Vascular;  Laterality: Left;  . Embolectomy Left 12/16/2012    Procedure: THROMBECTOMY  LEFT LEG BYPASS;  Surgeon: Sherren Kerns, MD;  Location: [facility] OR;  Service: Vascular;  Laterality: Left;  . Leg amputation above knee Left 02/04/2013    DR LAWSON   Family History  Problem Relation Age of Onset  . Cancer      breast/fhx  . Heart disease      fhx  . Diabetes Neg Hx   . Cancer Father    Social History:  reports that he has never smoked. He has never used smokeless tobacco. He reports that he drinks about 2.4 ounces of alcohol per week. He reports that he does not use illicit drugs. Allergies:  Allergies  Allergen Reactions  . Other Other (See Comments)     Plastic tape tears skin   Medications Prior to Admission  Medication Sig Dispense Refill  . atorvastatin (LIPITOR) 10 MG tablet Take 1 tablet (10 mg total) by mouth daily.  30 tablet  4  . carvedilol (COREG) 6.25 MG tablet Take 1 tablet (6.25 mg total) by mouth 2 (two) times daily with a meal.  180 tablet  3  . cephALEXin (KEFLEX) 500 MG capsule Take 1 capsule (500 mg total) by mouth 3 (three) times daily.  63 capsule  0  . cephALEXin (KEFLEX) 500 MG capsule Take 500 mg by mouth 3 (three) times daily. Started 11-20; 10 day course      . enoxaparin (LOVENOX) 80 MG/0.8ML injection Inject 0.8 mLs (80 mg total) into the skin daily.  10 Syringe  0  . finasteride (PROSCAR)  5 MG tablet Take 5 mg by mouth daily.      . furosemide (LASIX) 40 MG tablet Take 1 tablet (40 mg total) by mouth daily.  90 tablet  3  . gabapentin (NEURONTIN) 300 MG capsule Take 300-600 mg by mouth 2 (two) times daily. Takes 300 mg (1 capsule) in the morning and 600 mg (2 capsules) at night      . HYDROcodone-acetaminophen (NORCO/VICODIN) 5-325 MG per tablet Take 1 tablet by mouth every 6 (six) hours as needed.  40 tablet  0  . insulin aspart protamine-insulin aspart (NOVOLOG 70/30) (70-30) 100 UNIT/ML injection Inject 10 Units into the skin 2 (two) times daily with a meal. 10 units with breakfast, and 10 units with the evening meal      . losartan (COZAAR) 100 MG tablet Take 1 tablet (100 mg total) by mouth daily.  90 tablet  3  . NITROSTAT 0.4 MG SL tablet Place 0.4 mg under the tongue every 5 (five) minutes as needed for chest pain. For chest pain, max 3 doses      . oxyCODONE-acetaminophen (PERCOCET/ROXICET) 5-325 MG per tablet Take 1-2 tablets every 4-6 hrs./ prn for severe pain  30 tablet  0  . warfarin (COUMADIN) 5 MG tablet Take 2.5 mg by mouth See admin instructions. Takes 2.5 mg daily EXCEPT for none on Thursday        Home: Home Living Family/patient expects to be discharged to:: Private residence Living  Arrangements: Alone  Functional History:   Functional Status:  Mobility:          ADL:    Cognition: Cognition Orientation Level: Oriented X4    Blood pressure 177/80, pulse 71, temperature 98.1 F (36.7 C), temperature source Oral, resp. rate 18, SpO2 99.00%. Physical Exam  Vitals reviewed. Constitutional: He is oriented to person, place, and time. He appears well-developed.  HENT:  Head: Normocephalic.  Eyes: EOM are normal.  Neck: Normal range of motion. Neck supple. No thyromegaly present.  Cardiovascular:  Cardiac rate controlled  Respiratory: Effort normal and breath sounds normal. No respiratory distress.  GI: Soft. Bowel sounds are normal. He exhibits no distension.  Neurological: He is alert and oriented to person, place, and time.  Follows commands. Strength grossly intact. May have mild sensory loss in distal right leg.  Skin:  Amputation sites dressed and appropriately tender. Right leg with numerous scars from prior grafts.     Results for orders placed during the hospital encounter of 02/04/13 (from the past 24 hour(s))  APTT     Status: None   Collection Time    02/04/13  6:02 AM      Result Value Range   aPTT 34  24 - 37 seconds  PROTIME-INR     Status: None   Collection Time    02/04/13  6:02 AM      Result Value Range   Prothrombin Time 14.0  11.6 - 15.2 seconds   INR 1.10  0.00 - 1.49  GLUCOSE, CAPILLARY     Status: Abnormal   Collection Time    02/04/13  9:08 AM      Result Value Range   Glucose-Capillary 137 (*) 70 - 99 mg/dL   Comment 1 Documented in Chart     Comment 2 Notify RN    GLUCOSE, CAPILLARY     Status: Abnormal   Collection Time    02/04/13  1:23 PM      Result Value Range   Glucose-Capillary 127 (*)  70 - 99 mg/dL  GLUCOSE, CAPILLARY     Status: Abnormal   Collection Time    02/04/13  4:05 PM      Result Value Range   Glucose-Capillary 125 (*) 70 - 99 mg/dL  GLUCOSE, CAPILLARY     Status: Abnormal   Collection Time     02/04/13  9:33 PM      Result Value Range   Glucose-Capillary 116 (*) 70 - 99 mg/dL  BASIC METABOLIC PANEL     Status: Abnormal   Collection Time    02/05/13  3:53 AM      Result Value Range   Sodium 140  135 - 145 mEq/L   Potassium 4.5  3.5 - 5.1 mEq/L   Chloride 106  96 - 112 mEq/L   CO2 23  19 - 32 mEq/L   Glucose, Bld 119 (*) 70 - 99 mg/dL   BUN 29 (*) 6 - 23 mg/dL   Creatinine, Ser 1.61 (*) 0.50 - 1.35 mg/dL   Calcium 8.7  8.4 - 09.6 mg/dL   GFR calc non Af Amer 38 (*) >90 mL/min   GFR calc Af Amer 44 (*) >90 mL/min  CBC     Status: Abnormal   Collection Time    02/05/13  3:53 AM      Result Value Range   WBC 9.3  4.0 - 10.5 K/uL   RBC 4.17 (*) 4.22 - 5.81 MIL/uL   Hemoglobin 11.0 (*) 13.0 - 17.0 g/dL   HCT 04.5 (*) 40.9 - 81.1 %   MCV 82.7  78.0 - 100.0 fL   MCH 26.4  26.0 - 34.0 pg   MCHC 31.9  30.0 - 36.0 g/dL   RDW 91.4  78.2 - 95.6 %   Platelets 221  150 - 400 K/uL  PROTIME-INR     Status: None   Collection Time    02/05/13  3:53 AM      Result Value Range   Prothrombin Time 13.8  11.6 - 15.2 seconds   INR 1.08  0.00 - 1.49   No results found.  Assessment/Plan: Diagnosis: left AKA 1. Does the need for close, 24 hr/day medical supervision in concert with the patient's rehab needs make it unreasonable for this patient to be served in a less intensive setting? Yes 2. Co-Morbidities requiring supervision/potential complications: gout, peripheral neuropathy 3. Due to bladder management, bowel management, safety, skin/wound care, disease management, medication administration, pain management and patient education, does the patient require 24 hr/day rehab nursing? Yes 4. Does the patient require coordinated care of a physician, rehab nurse, PT (1-2 hrs/day, 5 days/week) and OT (1-2 hrs/day, 5 days/week) to address physical and functional deficits in the context of the above medical diagnosis(es)? Yes Addressing deficits in the following areas: balance, endurance,  locomotion, strength, transferring, bowel/bladder control, bathing, dressing, feeding, grooming, toileting and psychosocial support 5. Can the patient actively participate in an intensive therapy program of at least 3 hrs of therapy per day at least 5 days per week? Yes 6. The potential for patient to make measurable gains while on inpatient rehab is excellent 7. Anticipated functional outcomes upon discharge from inpatient rehab are mod I w/c with PT, mod I w/c with OT, n/a with SLP. 8. Estimated rehab length of stay to reach the above functional goals is:  13-15 days 9. Does the patient have adequate social supports to accommodate these discharge functional goals? Yes 10. Anticipated D/C setting: Home 11. Anticipated post D/C treatments:  HH therapy 12. Overall Rehab/Functional Prognosis: excellent  RECOMMENDATIONS: This patient's condition is appropriate for continued rehabilitative care in the following setting: CIR Patient has agreed to participate in recommended program. Yes Note that insurance prior authorization may be required for reimbursement for recommended care.  Comment: Rehab RN to follow up.   Ranelle Oyster, MD, [place] [facility]     02/05/2013

## 2013-02-05 NOTE — Progress Notes (Signed)
UR Completed.  [name] [phone] 02/05/2013  

## 2013-02-05 NOTE — Progress Notes (Addendum)
[facility] Progress Note  02/05/2013 7:26 AM POD 1  Subjective:  C/o being sweaty  Afebrile VSS  97% RA  Filed Vitals:   02/05/13 0557  BP: 158/76  Pulse: 69  Temp: 98.3 F (36.8 C)  Resp: 18    Physical Exam: Incisions:  Bandage in place-dry and in tact.   CBC    Component Value Date/Time   WBC 9.3 02/05/2013 0353   RBC 4.17* 02/05/2013 0353   HGB 11.0* 02/05/2013 0353   HCT 34.5* 02/05/2013 0353   PLT 221 02/05/2013 0353   MCV 82.7 02/05/2013 0353   MCH 26.4 02/05/2013 0353   MCHC 31.9 02/05/2013 0353   RDW 15.4 02/05/2013 0353   LYMPHSABS 0.9 10/15/2012 1136   MONOABS 0.9 10/15/2012 1136   EOSABS 0.0 10/15/2012 1136   BASOSABS 0.0 10/15/2012 1136    BMET    Component Value Date/Time   NA 140 02/05/2013 0353   K 4.5 02/05/2013 0353   CL 106 02/05/2013 0353   CO2 23 02/05/2013 0353   GLUCOSE 119* 02/05/2013 0353   BUN 29* 02/05/2013 0353   CREATININE 1.67* 02/05/2013 0353   CREATININE 2.68* 09/07/2012 0812   CALCIUM 8.7 02/05/2013 0353   GFRNONAA 38* 02/05/2013 0353   GFRAA 44* 02/05/2013 0353    INR    Component Value Date/Time   INR 1.08 02/05/2013 0353   INR 2.8 01/26/2013 0910     Intake/Output Summary (Last 24 hours) at 02/05/13 0726 Last data filed at 02/05/13 0510  Gross per 24 hour  Intake   1290 ml  Output   2050 ml  Net   -760 ml     Assessment/Plan:  78 y.o. male is s/p left above knee amputation  POD 1  -acute surgical blood loss anemia-pt tolerating -bandage in place-will take down dressing tomorrow -PT evaluate and treat -CIR consulted yesterday -coumadin restarted last pm   Doreatha Massed, PA-C [facility] [phone] 02/05/2013 7:26 AM   Agree with above assessment Patient in better spirits this morning Not having severe pain but is having some mild phantom sensations PT OT and rehabilitation medicine had seen patient Plan is CIR first of week-agree with this plan should be good candidate for  prosthesis very motivated  Plan remove drains in a.m. and check wound

## 2013-02-05 NOTE — Evaluation (Signed)
Physical Therapy Evaluation Patient Details Name: [name] MRN: [id number] DOB: 03/01/1935 Today's Date: 02/05/2013 Time: 4098-1191 PT Time Calculation (min): 34 min  PT Assessment / Plan / Recommendation History of Present Illness  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 PMHx includes DM, afib, Pacemaker, CHF with EF 35%, depression   Clinical Impression  Patient is s/p Lt AKA surgery resulting in functional limitations due to the deficits listed below (see PT Problem List). Pt very motivated and anticipate eventual return to modified independent status. Patient will benefit from skilled PT to increase their independence and safety with mobility to allow discharge to the venue listed below.       PT Assessment  Patient needs continued PT services    Follow Up Recommendations  CIR;Supervision for mobility/OOB    Does the patient have the potential to tolerate intense rehabilitation      Barriers to Discharge Decreased caregiver support ? how long son can stay when he returns    Equipment Recommendations  Rolling walker with 5" wheels;Wheelchair (measurements PT);Wheelchair cushion (measurements PT)    Recommendations for Other Services     Frequency Min 5X/week    Precautions / Restrictions Precautions Precautions: Fall Precaution Comments: pt having strong phantom sensation that his foot is still there   Pertinent Vitals/Pain 6-7/10 LLE; RN provided medication to assist with pain control patient repositioned for comfort       Mobility  Bed Mobility Bed Mobility: Supine to Sit;Sitting - Scoot to Edge of Bed Supine to Sit: 3: Mod assist;HOB elevated;With rails Sitting - Scoot to Edge of Bed: 3: Mod assist Details for Bed Mobility Assistance: pt with numerous pads and blanket under him in the bed making mobility more difficult Transfers Transfers: Sit to Stand;Stand to Sit Sit to Stand: 3: Mod assist;With upper extremity assist Stand to Sit: 4: Min assist Details  for Transfer Assistance: stood at EOB x 15 sec and returned to sitting due to dizziness; subsided and stood again with steps to chair with RW Ambulation/Gait Ambulation/Gait Assistance: 4: Min assist Ambulation Distance (Feet): 2 Feet Assistive device: Rolling walker Ambulation/Gait Assistance Details: vc for technique and sequencing; pt required multiple attempts to get confidence to take the first step with following steps much easier as he gained confidence Gait Pattern: Step-to pattern    Exercises Amputee Exercises Gluteal Sets: AROM;Left;5 reps;Supine ("thigh press" to pt) Hip ABduction/ADduction: AROM;Left;5 reps;Supine Hip Flexion/Marching: AROM;Left;5 reps;Supine Other Exercises Other Exercises: pt experiencing phantom sensation (not pain, feels his foot is still there). Educated on touching end of stump, AROM or massage of RLE to assist with decr phantom sensation   PT Diagnosis: Difficulty walking;Acute pain  PT Problem List: Decreased activity tolerance;Decreased balance;Decreased mobility;Decreased knowledge of use of DME;Impaired sensation;Pain PT Treatment Interventions: DME instruction;Gait training;Functional mobility training;Therapeutic activities;Therapeutic exercise;Patient/family education     PT Goals(Current goals can be found in the care plan section) Acute Rehab PT Goals Patient Stated Goal: return to his own home PT Goal Formulation: With patient Time For Goal Achievement: 02/12/13 Potential to Achieve Goals: Good  Visit Information  Last PT Received On: 02/05/13 Assistance Needed: +1 History of Present Illness: 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 PMHx includes DM, afib, Pacemaker, CHF with EF 35%, depression        Prior Functioning  Home Living Family/patient expects to be discharged to:: Private residence Living Arrangements: Alone Available Help at Discharge: Family;Available 24 hours/day (son from [place], planning to come down) Type of Home:  House Home Access: Level entry Home Layout: One level Home Equipment: Wheelchair - manual;Cane - single point;Crutches;Shower seat (walk in shower; W/C borrowed from a neighbor) Additional Comments: son states bathroom door only 24" wide, is planning to put in a wider door Prior Function Level of Independence: Independent with assistive device(s) Comments: used cane PTA; 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    Copywriter, advertising Arousal/Alertness: Awake/alert Behavior During Therapy: WFL for tasks assessed/performed Overall Cognitive Status: Within Functional Limits for tasks assessed    Extremity/Trunk Assessment Upper Extremity Assessment Upper Extremity Assessment: Overall WFL for tasks assessed Lower Extremity Assessment Lower Extremity Assessment: LLE deficits/detail LLE Deficits / Details: AROM limited due to pain; able to fully extend hip in standing LLE Sensation:  (phantom sensation) Cervical / Trunk Assessment Cervical / Trunk Assessment: Normal   Balance Balance Balance Assessed: Yes Static Sitting Balance Static Sitting - Balance Support: Bilateral upper extremity supported;Feet supported Static Sitting - Level of Assistance: 5: Stand by assistance Static Sitting - Comment/# of Minutes: EOB total of 7 minutes Static Standing Balance Static Standing - Balance Support: Bilateral upper extremity supported Static Standing - Level of Assistance: 4: Min assist  End of Session PT - End of Session Equipment Utilized During Treatment: Gait belt Activity Tolerance: Patient tolerated treatment well Patient left: in chair;with call bell/phone within reach;with nursing/sitter in room;with family/visitor present Nurse Communication: Mobility status;Patient requests pain meds  GP     Melony Tenpas 02/05/2013, 10:17 AM Pager [phone]

## 2013-02-05 NOTE — Evaluation (Signed)
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 PMHx includes DM, afib, Pacemaker, CHF with EF 35%, depression    Clinical Impression   Pt admitted for the above diagnosis and has the deficits listed below.  Pt would benefit from cont OT to increase I with basic adls and adl transfers so he can d/c home after rehab.    OT Assessment  Patient needs continued OT Services    Follow Up Recommendations  CIR    Barriers to Discharge Decreased caregiver support pt lives alone but feel he could get to level w rehab where he could be mod I with family checking in.  Equipment Recommendations  3 in 1 bedside comode    Recommendations for Other Services Rehab consult  Frequency  Min 3X/week    Precautions / Restrictions Precautions Precautions: Fall Precaution Comments: pt having strong phantom sensation that his foot is still there Restrictions Weight Bearing Restrictions: No   Pertinent Vitals/Pain Pt c/o pain in stump area as well as phantom sensations.  Pain was 6/10.  Nursing aware.  All other vitals stable.    ADL  Eating/Feeding: Performed;Independent Where Assessed - Eating/Feeding: Chair Grooming: Performed;Set up Where Assessed - Grooming: Unsupported sitting Upper Body Bathing: Simulated;Set up Where Assessed - Upper Body Bathing: Supported sitting Lower Body Bathing: Simulated;Moderate assistance Where Assessed - Lower Body Bathing: Supported sit to stand Upper Body Dressing: Performed;Set up Where Assessed - Upper Body Dressing: Supported sitting Lower Body Dressing: Performed;Moderate assistance Where Assessed - Lower Body Dressing: Supported sit to stand Toilet Transfer: Performed;Minimal Dentist Method: Scientist, product/process development: Materials engineer and Hygiene: Performed;Minimal assistance Where Assessed - Engineer, mining and Hygiene: Standing Equipment Used: Rolling walker Transfers/Ambulation Related to ADLs: Pt did well with pivot transfers to chair. Encouraged to take his time and weight bear through his arms to make it easier to hop on foot with walker. ADL Comments: Pt did well with adls in the chair. Pt needs assist in standing. Pt "feels" his L leg w phantom sensations and sometimes lets go of walker with both hands when standing to do adls.    OT Diagnosis: Generalized weakness;Acute pain  OT Problem List: Decreased strength;Impaired balance (sitting and/or standing);Decreased knowledge of use of DME or AE;Pain OT Treatment Interventions: Self-care/ADL training;Therapeutic exercise;Therapeutic activities;DME and/or AE instruction;Balance training   OT Goals(Current goals can be found in the care plan section) Acute Rehab OT Goals Patient Stated Goal: return to his own home OT Goal Formulation: With patient Time For Goal Achievement: 02/19/13 Potential to Achieve Goals: Good ADL Goals Pt Will Perform Grooming: with supervision;standing Pt Will Perform Lower Body Bathing: with supervision;sit to/from stand Pt Will Perform Lower Body Dressing: with supervision;sit to/from stand Pt Will Perform Tub/Shower Transfer: with min assist;Shower transfer;rolling walker;ambulating;shower seat Additional ADL Goal #1: Pt will complete all toileting on 3:1 over commode with S  Visit Information  Last OT Received On: 02/05/13 Assistance Needed: +1 History of Present Illness: 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 PMHx includes DM, afib, Pacemaker, CHF with EF 35%, depression        Prior Functioning     Home Living Family/patient expects to be discharged to:: Private residence Living Arrangements: Alone Available Help at Discharge:  Family;Available 24 hours/day (son from [place], planning to come down) Type of Home: House Home Access: Level entry Home Layout: One level Home Equipment: Wheelchair - manual;Cane - single point;Crutches;Shower seat (walk in shower; W/C borrowed from a neighbor) Additional Comments: son states bathroom door only 24" wide, is planning to put in a wider door Prior Function Level of Independence: Independent with assistive device(s) Comments: used cane PTA; 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 Dominant Hand: Right         Vision/Perception Vision - History Baseline Vision: No visual deficits Patient Visual Report: No change from baseline Vision - Assessment Vision Assessment: Vision not tested   [employer] Arousal/Alertness: Awake/alert Behavior During Therapy: WFL for tasks assessed/performed Overall Cognitive Status: Within Functional Limits for tasks assessed    Extremity/Trunk Assessment Upper Extremity Assessment Upper Extremity Assessment: Overall WFL for tasks assessed Lower Extremity Assessment Lower Extremity Assessment: LLE deficits/detail LLE Deficits / Details: AROM limited due to pain; able to fully extend hip in standing LLE Sensation:  (phantom sensation) Cervical / Trunk Assessment Cervical / Trunk Assessment: Normal     Mobility Bed Mobility Bed Mobility: Supine to Sit;Sitting - Scoot to Edge of Bed;Sit to Supine Supine to Sit: 3: Mod assist;HOB elevated;With rails Sitting - Scoot to Edge of Bed: 3: Mod assist Sit to Supine: 4: Min assist Details for Bed Mobility Assistance: pt with numerous pads and blanket under him in the bed making mobility more difficult Transfers Transfers: Sit to Stand;Stand to Sit Sit to Stand: 3: Mod assist;With upper extremity assist Stand to Sit: 4: Min assist Details for Transfer Assistance: stood at EOB x 15 sec and returned to sitting due to dizziness; subsided and stood again with  steps to chair with RW     Exercise Amputee Exercises Gluteal Sets: AROM;Left;5 reps;Supine ("thigh press" to pt) Hip ABduction/ADduction: AROM;Left;5 reps;Supine Hip Flexion/Marching: AROM;Left;5 reps;Supine Other Exercises Other Exercises: pt experiencing phantom sensation (not pain, feels his foot is still there). Educated on touching end of stump, AROM or massage of RLE to assist with decr phantom sensation   Balance Balance Balance Assessed: Yes Static Sitting Balance Static Sitting - Balance Support: Bilateral upper extremity supported;Feet supported Static Sitting - Level of Assistance: 5: Stand by assistance Static Sitting - Comment/# of Minutes: EOB total of 7 minutes Static Standing Balance Static Standing - Balance Support: Bilateral upper extremity supported Static Standing - Level of Assistance: 4: Min assist   End of Session OT - End of Session Equipment Utilized During Treatment: Rolling walker Activity Tolerance: Patient tolerated treatment well Patient left: in bed;with call bell/phone within reach;with family/visitor present Nurse Communication: Mobility status;Patient requests pain meds  GO     Hope Budds 02/05/2013, 11:04 AM [phone]

## 2013-02-06 LAB — GLUCOSE, CAPILLARY
Glucose-Capillary: 255 mg/dL — ABNORMAL HIGH (ref 70–99)
Glucose-Capillary: 112 mg/dL — ABNORMAL HIGH (ref 70–99)
Glucose-Capillary: 129 mg/dL — ABNORMAL HIGH (ref 70–99)

## 2013-02-06 LAB — CBC
HCT: 33.9 % — ABNORMAL LOW (ref 39.0–52.0)
Hemoglobin: 10.7 g/dL — ABNORMAL LOW (ref 13.0–17.0)
MCH: 26.3 pg (ref 26.0–34.0)
MCHC: 31.6 g/dL (ref 30.0–36.0)
MCV: 83.3 fL (ref 78.0–100.0)
Platelets: 193 10*3/uL (ref 150–400)
RBC: 4.07 MIL/uL — ABNORMAL LOW (ref 4.22–5.81)

## 2013-02-06 LAB — BASIC METABOLIC PANEL
CO2: 21 mEq/L (ref 19–32)
Calcium: 8.5 mg/dL (ref 8.4–10.5)
Creatinine, Ser: 1.79 mg/dL — ABNORMAL HIGH (ref 0.50–1.35)
GFR calc Af Amer: 41 mL/min — ABNORMAL LOW (ref >90)
GFR calc non Af Amer: 35 mL/min — ABNORMAL LOW (ref >90)
Glucose, Bld: 120 mg/dL — ABNORMAL HIGH (ref 70–99)
Potassium: 4.7 mEq/L (ref 3.5–5.1)
Sodium: 139 mEq/L (ref 135–145)

## 2013-02-06 LAB — PROTIME-INR
INR: 1.01 (ref 0.00–1.49)
Prothrombin Time: 13.1 seconds (ref 11.6–15.2)

## 2013-02-06 MED ORDER — ENOXAPARIN SODIUM 30 MG/0.3ML ~~LOC~~ SOLN
30.0000 mg | SUBCUTANEOUS | Status: DC
  Administered 2013-02-07 – 2013-02-09 (×3): 30 mg via SUBCUTANEOUS
  Filled 2013-02-06 (×4): qty 0.3

## 2013-02-06 NOTE — Progress Notes (Addendum)
[facility] Progress Note  02/06/2013 10:20 AM POD 2  Subjective:  Feeling better today  Afebrile  VSS  98% RA  Filed Vitals:   02/06/13 0441  BP: 138/67  Pulse: 70  Temp: 98.3 F (36.8 C)  Resp: 18    Physical Exam: Incisions:  Incision is c/d/i with staples in tact. Extremities:  Good movement of stump  CBC    Component Value Date/Time   WBC 7.8 02/06/2013 0432   RBC 4.07* 02/06/2013 0432   HGB 10.7* 02/06/2013 0432   HCT 33.9* 02/06/2013 0432   PLT 193 02/06/2013 0432   MCV 83.3 02/06/2013 0432   MCH 26.3 02/06/2013 0432   MCHC 31.6 02/06/2013 0432   RDW 15.3 02/06/2013 0432   LYMPHSABS 0.9 10/15/2012 1136   MONOABS 0.9 10/15/2012 1136   EOSABS 0.0 10/15/2012 1136   BASOSABS 0.0 10/15/2012 1136    BMET    Component Value Date/Time   NA 139 02/06/2013 0432   K 4.7 02/06/2013 0432   CL 106 02/06/2013 0432   CO2 21 02/06/2013 0432   GLUCOSE 120* 02/06/2013 0432   BUN 35* 02/06/2013 0432   CREATININE 1.79* 02/06/2013 0432   CREATININE 2.68* 09/07/2012 0812   CALCIUM 8.5 02/06/2013 0432   GFRNONAA 35* 02/06/2013 0432   GFRAA 41* 02/06/2013 0432    INR    Component Value Date/Time   INR 1.01 02/06/2013 0432   INR 2.8 01/26/2013 0910     Intake/Output Summary (Last 24 hours) at 02/06/13 1020 Last data filed at 02/06/13 1610  Gross per 24 hour  Intake    720 ml  Output    900 ml  Net   -180 ml     Assessment/Plan:  78 y.o. male is s/p left above knee amputation  POD 2  -pt doing well - dressing removed and stump is viable. -redressed. -drain is removed -pt for CIR Monday -BUN/Cr slightly up today-pt on Lovenox 80 mg daily-will change to 30 mg daily -check BMP in am   Doreatha Massed, PA-C [facility] [phone] 02/06/2013 10:20 AM  AKA stump looks good-Hemovac drains removed Patient having mild to moderate amount of phantom sensations  PT-OT consults complete-hopefully patient will qualify for CIR first of week. Good  candidate for prosthesis-very motivated

## 2013-02-06 NOTE — Progress Notes (Addendum)
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 PMHx includes DM, afib, Pacemaker, CHF with EF 35%, depression    PT Comments   Pt limited today secondary to fatigue. Pt was motivated to increase ambulation distance today. Was able to take steps forwardly, laterally, and backwards. Pt requires (A) to maintain balance and manage RW with ambulating. Continue to recommend CIR upon acute D/C. Will cont to follow per POC.   Follow Up Recommendations  CIR;Supervision for mobility/OOB     Does the patient have the potential to tolerate intense rehabilitation     Barriers to Discharge        Equipment Recommendations  Rolling walker with 5" wheels;Wheelchair (measurements PT);Wheelchair cushion (measurements PT)    Recommendations for Other Services    Frequency Min 5X/week   Progress towards PT Goals Progress towards PT goals: Progressing toward goals  Plan Current plan remains appropriate    Precautions / Restrictions Precautions Precautions: Fall Restrictions Weight Bearing Restrictions: No   Pertinent Vitals/Pain Denies pain today. Pt premedicated.    Mobility  Bed Mobility Bed Mobility: Supine to Sit;Sitting - Scoot to Edge of Bed;Sit to Supine Supine to Sit: 5: Supervision;HOB elevated;With rails Sitting - Scoot to Edge of Bed: 5: Supervision;With rail Sit to Supine: 5: Supervision Details for Bed Mobility Assistance: pt required HOB to be elevated and relied heavily on handrails for bed mobility; cues for hand placement and sequencing  Transfers Transfers: Sit to Stand;Stand to Sit Sit to Stand: 3: Mod assist;From bed;From elevated surface;With upper extremity assist Stand to Sit: 4: Min assist;To bed;With upper extremity assist Details for Transfer  Assistance: required 2 attempts; on initial attempt pt was from lower surfaced bed and had posterior LOB requiriring to return to bed; with bed elevated pt was able to power up through Rt LE and with (A) achieve upright standing position; cues for hand placement and sequencing; pt tends to attempt to pull up on RW  Ambulation/Gait Ambulation/Gait Assistance: 4: Min assist Ambulation Distance (Feet): 10 Feet (with steps forward, back to bed and laterally) Assistive device: Rolling walker Ambulation/Gait Assistance Details: (A) to maintain balance and manage RW; cues for sequencing and upright posture; pt with increased anxiety ambulating but was motivated to increase ambulation distance  Gait Pattern: Step-to pattern Gait velocity: decreased Stairs: No Wheelchair Mobility Wheelchair Mobility: No         PT Diagnosis:    PT Problem List:   PT Treatment Interventions:     PT Goals (current goals can now be found in the care plan section) Acute Rehab PT Goals Patient Stated Goal: to take a nap  PT Goal Formulation: With patient Time For Goal Achievement: 02/12/13 Potential to Achieve Goals: Good  Visit Information  Last PT Received On: 02/06/13 Assistance Needed: +1 History of Present Illness: 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 PMHx includes DM, afib, Pacemaker, CHF with EF 35%, depression     Subjective Data  Subjective: pt reported he was very tired and fatigued today; agreeable to attempt ambulating  Patient Stated Goal: to take a nap    Cognition  Cognition Arousal/Alertness: Awake/alert Behavior During Therapy: WFL for tasks assessed/performed Overall Cognitive Status: Within Functional Limits for tasks assessed    Balance  Balance Balance Assessed: Yes Static Sitting  Balance Static Sitting - Balance Support: Bilateral upper extremity supported;Feet supported (Rt LE supported) Static Sitting - Level of Assistance: 5: Stand by assistance Static Sitting - Comment/# of  Minutes: pt tolerated sitting EOB to use urinal  High Level Balance High Level Balance Activites: Backward walking;Side stepping High Level Balance Comments: min (A) to maintain balance with high level activities   End of Session PT - End of Session Equipment Utilized During Treatment: Gait belt Activity Tolerance: Patient limited by fatigue Patient left: in bed;with call bell/phone within reach Nurse Communication: Mobility status   GP     Donell Sievert, [place] [phone] 02/06/2013, 3:15 PM

## 2013-02-06 NOTE — Progress Notes (Deleted)
Pt walked to the nursing station and back about 60 ft with walker

## 2013-02-07 LAB — BASIC METABOLIC PANEL
BUN: 39 mg/dL — ABNORMAL HIGH (ref 6–23)
Calcium: 8.3 mg/dL — ABNORMAL LOW (ref 8.4–10.5)
Creatinine, Ser: 1.9 mg/dL — ABNORMAL HIGH (ref 0.50–1.35)
GFR calc Af Amer: 38 mL/min — ABNORMAL LOW (ref >90)
GFR calc non Af Amer: 33 mL/min — ABNORMAL LOW (ref >90)
Sodium: 136 mEq/L (ref 135–145)

## 2013-02-07 LAB — GLUCOSE, CAPILLARY
Glucose-Capillary: 72 mg/dL (ref 70–99)
Glucose-Capillary: 49 mg/dL — ABNORMAL LOW (ref 70–99)

## 2013-02-07 LAB — PROTIME-INR
INR: 1.1 (ref 0.00–1.49)
Prothrombin Time: 14 seconds (ref 11.6–15.2)

## 2013-02-07 MED ORDER — DIPHENHYDRAMINE HCL 25 MG PO CAPS
25.0000 mg | ORAL_CAPSULE | Freq: Three times a day (TID) | ORAL | Status: DC | PRN
  Administered 2013-02-07 – 2013-02-08 (×3): 25 mg via ORAL
  Filled 2013-02-07 (×3): qty 1

## 2013-02-07 MED ORDER — BISACODYL 10 MG RE SUPP
10.0000 mg | Freq: Every day | RECTAL | Status: DC | PRN
  Administered 2013-02-09: 10 mg via RECTAL
  Filled 2013-02-07: qty 1

## 2013-02-07 MED ORDER — GLUCOSE 40 % PO GEL
ORAL | Status: AC
  Administered 2013-02-07: 37.5 g
  Filled 2013-02-07: qty 1

## 2013-02-07 MED ORDER — GLUCOSE 40 % PO GEL: 1.0000 | ORAL | Status: DC | PRN

## 2013-02-07 NOTE — Progress Notes (Signed)
Patient ID: [name], male   DOB: 09/17/1934, 78 y.o.   MRN: [id number] Vascular Surgery Progress Note  Subjective: Status post left AKA. Patient denies any significant pain in stomach. Continuing to have phantom sensations left foot. No phantom pain.  Objective:  Filed Vitals:   02/07/13 0525  BP: 161/64  Pulse: 74  Temp: 98 F (36.7 C)  Resp: 18    General alert and oriented x3 Left stump looks good    Labs:  Recent Labs Lab 02/05/13 0353 02/06/13 0432 02/07/13 0514  CREATININE 1.67* 1.79* 1.90*    Recent Labs Lab 02/05/13 0353 02/06/13 0432 02/07/13 0514  NA 140 139 136  K 4.5 4.7 4.2  CL 106 106 102  CO2 23 21 23   BUN 29* 35* 39*  CREATININE 1.67* 1.79* 1.90*  GLUCOSE 119* 120* 110*  CALCIUM 8.7 8.5 8.3*    Recent Labs Lab 02/05/13 0353 02/06/13 0432  WBC 9.3 7.8  HGB 11.0* 10.7*  HCT 34.5* 33.9*  PLT 221 193    Recent Labs Lab 02/05/13 0353 02/06/13 0432 02/07/13 0514  INR 1.08 1.01 1.10    I/O last 3 completed shifts: In: 480 [P.O.:480] Out: 1100 [Urine:1100]  Imaging: No results found.  Assessment/Plan:  POD #3  LOS: 3 days  s/p Procedure(s): AMPUTATION ABOVE KNEE-LEFT  Patient doing well following left AKA Rehabilitation medicine will decide tomorrow regarding CIR versus outpatient rehabilitation   [name], MD 02/07/2013 8:40 AM

## 2013-02-07 NOTE — Progress Notes (Signed)
Hypoglycemic Event  CBG: 49  Treatment: 15 GM carbohydrate snack  Symptoms: None  Follow-up CBG: Time:2110 CBG Result:45  Possible Reasons for Event: Unknown  Comments/MD notified:    Mercy Moore  Remember to initiate Hypoglycemia Order Set & complete

## 2013-02-07 NOTE — Progress Notes (Signed)
Hypoglycemic Event  CBG: 45  Treatment: 1 tube instant glucose  Symptoms: None  Follow-up CBG: Time:2134 CBG Result:66  Possible Reasons for Event: Unknown  Comments/MD notified:   Mercy Moore  Remember to initiate Hypoglycemia Order Set & complete

## 2013-02-07 NOTE — Progress Notes (Signed)
Hypoglycemic Event  CBG: 66  Treatment: 15 GM carbohydrate snack  Symptoms: None  Follow-up CBG: Time:2207 CBG Result:83  Possible Reasons for Event: Unknown  Comments/MD notified:    Mercy Moore  Remember to initiate Hypoglycemia Order Set & complete

## 2013-02-08 LAB — GLUCOSE, CAPILLARY
Glucose-Capillary: 127 mg/dL — ABNORMAL HIGH (ref 70–99)
Glucose-Capillary: 133 mg/dL — ABNORMAL HIGH (ref 70–99)

## 2013-02-08 LAB — PROTIME-INR
INR: 1.09 (ref 0.00–1.49)
Prothrombin Time: 13.9 seconds (ref 11.6–15.2)

## 2013-02-08 NOTE — Progress Notes (Signed)
Inpatient Diabetes Program Recommendations  AACE/ADA: New Consensus Statement on Inpatient Glycemic Control (2013)  Target Ranges:  Prepandial:   less than 140 mg/dL      Peak postprandial:   less than 180 mg/dL (1-2 hours)      Critically ill patients:  140 - 180 mg/dL  Results for FISHER, HARGADON (MRN [id number]) as of 02/08/2013 13:02  Ref. Range 02/07/2013 20:41 02/07/2013 21:10 02/07/2013 21:34 02/07/2013 22:07 02/08/2013 02:11 02/08/2013 06:45 02/08/2013 11:36  Glucose-Capillary Latest Range: 70-99 mg/dL 49 (L) 45 (L) 66 (L) 83 127 (H) 136 (H) 89    Inpatient Diabetes Program Recommendations Insulin - Basal: may need a reduced dose of 70/30 with supper  Thank you  Piedad Climes BSN, RN,CDE Inpatient Diabetes Coordinator [phone] (team pager)

## 2013-02-08 NOTE — Progress Notes (Signed)
Patient ID: [name], male   DOB: Nov 30, 1934, 78 y.o.   MRN: [id number] Vascular Surgery Progress Note  Subjective: 4 days post left AKA. Patient states no pain in stump. Has mild phantom sensations and left foot which are not severe.  Objective:  Filed Vitals:   02/08/13 0534  BP: 132/59  Pulse: 72  Temp: 97.9 F (36.6 C)  Resp: 18    General alert and oriented x3 in no apparent distress Left AKA stump looks good no evidence of hematoma or infection   Labs:  Recent Labs Lab 02/05/13 0353 02/06/13 0432 02/07/13 0514  CREATININE 1.67* 1.79* 1.90*    Recent Labs Lab 02/05/13 0353 02/06/13 0432 02/07/13 0514  NA 140 139 136  K 4.5 4.7 4.2  CL 106 106 102  CO2 23 21 23   BUN 29* 35* 39*  CREATININE 1.67* 1.79* 1.90*  GLUCOSE 119* 120* 110*  CALCIUM 8.7 8.5 8.3*    Recent Labs Lab 02/05/13 0353 02/06/13 0432  WBC 9.3 7.8  HGB 11.0* 10.7*  HCT 34.5* 33.9*  PLT 221 193    Recent Labs Lab 02/06/13 0432 02/07/13 0514 02/08/13 0544  INR 1.01 1.10 1.09    I/O last 3 completed shifts: In: 480 [P.O.:480] Out: 1950 [Urine:1950]  Imaging: No results found.  Assessment/Plan:  POD #4  LOS: 4 days  s/p Procedure(s): AMPUTATION ABOVE KNEE-LEFT  Await decision regarding inpatient rehabilitation versus outpatient rehabilitation. If patient approved for CIR could be transferred at any time from vascular surgery standpoint   [name], MD 02/08/2013 7:08 AM

## 2013-02-08 NOTE — Progress Notes (Signed)
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 PMHx includes DM, afib, Pacemaker, CHF with EF 35%, depression    PT Comments   Progressing with ambulation, though unsafe with increased fatigue.  Needed seated rest prior to getting all the way back to the bed.  Patient able to tolerate amputee exercises well.  Will benefit from CIR interdisciplinary therapies to progress to modified indep at least from w/c level prior to d/c home as has intermitted assist at home.  Follow Up Recommendations  CIR;Supervision for mobility/OOB     Does the patient have the potential to tolerate intense rehabilitation   Yes  Barriers to Discharge  None      Equipment Recommendations  Rolling walker with 5" wheels;Wheelchair (measurements PT);Wheelchair cushion (measurements PT)    Recommendations for Other Services  None  Frequency Min 5X/week   Progress towards PT Goals Progress towards PT goals: Progressing toward goals  Plan Current plan remains appropriate    Precautions / Restrictions Precautions Precautions: Fall Restrictions Weight Bearing Restrictions: No   Pertinent Vitals/Pain C/o pulling in front of leg with hip extension in standing    Mobility  Bed Mobility Bed Mobility: Rolling Right Rolling Right: 4: Min assist;With rail Supine to Sit: 5: Supervision;HOB elevated;With rails Sitting - Scoot to Edge of Bed: 5: Supervision;With rail Details for Bed Mobility Assistance: assisted to right full sidelying for hip abduction therex Transfers Sit to Stand: From bed;4: Min assist;From chair/3-in-1;With upper extremity assist Stand to Sit: To chair/3-in-1;To bed;4: Min assist;3: Mod assist;With upper extremity assist Details for Transfer Assistance: assist for  steadying and lowering safely to sit after pt fatigued from ambulation in the room; cues for safe hand placement and for safety to back all the way to the bed Ambulation/Gait Ambulation/Gait Assistance: 4: Min assist Ambulation Distance (Feet): 16 Feet (and 6') Assistive device: Rolling walker Ambulation/Gait Assistance Details: assist for keeping walker close, keeping it still as he hops forward, demonstrates decreased step height as he fatigues Gait Pattern: Decreased stride length;Step-to pattern    Exercises Amputee Exercises Gluteal Sets: Strengthening;10 reps;Right;Supine Towel Squeeze: Strengthening;10 reps;Supine Hip Extension: AROM;10 reps;Standing;Right Hip ABduction/ADduction: Strengthening;10 reps;Sidelying;Right Hip Flexion/Marching: Strengthening;10 reps;Right;Supine     PT Goals (current goals can now be found in the care plan section)    Visit Information  Last PT Received On: 02/08/13 Assistance Needed: +1 History of Present Illness: 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 PMHx includes DM, afib, Pacemaker, CHF with EF 35%, depression     Subjective Data   They say I may go today if a bed becomes available.   Cognition  Cognition Arousal/Alertness: Awake/alert Behavior During Therapy: WFL for tasks assessed/performed Overall Cognitive Status: Within Functional Limits for tasks assessed    Balance  Static Standing Balance Static Standing - Balance Support: Bilateral upper extremity supported Static Standing - Level of Assistance: 4: Min assist  End of Session PT - End of Session Equipment Utilized During Treatment: Gait belt Activity Tolerance: Patient limited by fatigue Patient left: in bed;with call bell/phone within reach;with family/visitor present   GP     [facility] 02/08/2013, 11:51 AM Sheran Lawless, PT [phone] 02/08/2013

## 2013-02-08 NOTE — Care Management Note (Signed)
    Page 1 of 1   02/09/2013     3:57:20 PM   CARE MANAGEMENT NOTE 02/09/2013  Patient:  [name], [name]   Account Number:  [id number]  Date Initiated:  02/05/2013  Documentation initiated by:  Avie Arenas  Subjective/Objective Assessment:   LAKA for ischemia     Action/Plan:   Anticipated DC Date:  02/09/2013   Anticipated DC Plan:  IP REHAB FACILITY      DC Planning Services  CM consult      Choice offered to / List presented to:             Status of service:  Completed, signed off Medicare Important Message given?   (If response is "NO", the following Medicare IM given date fields will be blank) Date Medicare IM given:   Date Additional Medicare IM given:    Discharge Disposition:  IP REHAB FACILITY  Per UR Regulation:  Reviewed for med. necessity/level of care/duration of stay  If discussed at Long Length of Stay Meetings, dates discussed:   02/09/2013    Comments:  Contact:  Laman,Oletta Significant other [phone]                Amarien, Carne     [phone]                Albaraa, Swingle Daughter [phone]  02/09/13 Courtenay Creger,RN,BSN [phone] PT APPROVED BY INSURANCE FOR CIR.  DISCHARGE TO IP REHAB TODAY.  02/08/13 Livia Tarr,RN,BSN [phone] AWAITING INSURANCE AUTH FOR CIR.  CSW TO FAX OUT FOR SNF BACKUP, IF NEEDED.  HOPEFUL FOR DC TO CIR ON 02/09/13.

## 2013-02-08 NOTE — Progress Notes (Signed)
Occupational Therapy Treatment Patient Details Name: [name] MRN: [id number] DOB: Jun 25, 1934 Today's Date: 02/08/2013 Time: 4098-1191 OT Time Calculation (min): 21 min  OT Assessment / Plan / Recommendation  History of present illness 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 PMHx includes DM, afib, Pacemaker, CHF with EF 35%, depression    OT comments  Pt. Initially hesitant to participate secondary to c/o fatigue, but did agree to oob to commode.  Cues for walker safety during ambulation and use of b ues to push through walker and ease burden on rle.    Follow Up Recommendations  CIR           Equipment Recommendations  3 in 1 bedside comode    Recommendations for Other Services Rehab consult  Frequency Min 3X/week   Progress towards OT Goals Progress towards OT goals: Progressing toward goals  Plan Discharge plan remains appropriate    Precautions / Restrictions Precautions Precautions: Fall   Pertinent Vitals/Pain 6/10, repositioned    ADL  Toilet Transfer: Performed;Minimal assistance Toilet Transfer Method: Stand pivot Toilet Transfer Equipment: Regular height toilet;Grab bars Toileting - Clothing Manipulation and Hygiene: Performed;Min guard Where Assessed - Glass blower/designer Manipulation and Hygiene: Standing Transfers/Ambulation Related to ADLs: did well with amb., but limited by fatigue through b ues.   ADL Comments: inst. cues for ue support on grab bar in standing and pulling off lb garments using one hand        OT Goals(current goals can now be found in the care plan section)    Visit Information  Last OT Received On: 02/08/13 History of Present Illness: 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 PMHx includes DM, afib, Pacemaker, CHF with EF 35%, depression                  Cognition  Cognition Arousal/Alertness: Awake/alert Behavior During Therapy: WFL for tasks assessed/performed Overall Cognitive Status: Within Functional Limits for tasks  assessed    Mobility  Bed Mobility Details for Bed Mobility Assistance: hob flat and encouraged no use of rails to simulate home enviornment  Transfers Transfers: Sit to Stand;Stand to Sit Sit to Stand: 4: Min guard;4: Min assist;With upper extremity assist;From bed;From toilet Stand to Sit: 4: Min guard;4: Min assist;With upper extremity assist;With armrests;To bed;To toilet               End of Session OT - End of Session Equipment Utilized During Treatment: Rolling walker Activity Tolerance: Patient tolerated treatment well Patient left: in bed;with call bell/phone within reach       Robet Leu, COTA/L 02/08/2013, 3:50 PM

## 2013-02-08 NOTE — Progress Notes (Signed)
Rehab admissions - On Friday, I faxed all information to [facility] requesting acute inpatient rehab admission.  Today I called several case managers, faxed updates and left message regarding acute inpatient rehab request.  I will follow up as soon as I hear back from [facility] insurance carrier.  Call me for questions.  #[phone]

## 2013-02-09 ENCOUNTER — Inpatient Hospital Stay (HOSPITAL_COMMUNITY)
Admission: RE | Admit: 2013-02-09 | Discharge: 2013-02-15 | DRG: 945 | Disposition: A | Discharge status: Home Health | Payer: Medicare Other | Source: Intra-hospital | Attending: Physical Medicine & Rehabilitation | Admitting: Physical Medicine & Rehabilitation

## 2013-02-09 ENCOUNTER — Other Ambulatory Visit (HOSPITAL_COMMUNITY): Payer: Medicare Other

## 2013-02-09 ENCOUNTER — Encounter (HOSPITAL_COMMUNITY): Payer: Medicare Other

## 2013-02-09 ENCOUNTER — Ambulatory Visit: Payer: Medicare Other | Admitting: Vascular Surgery

## 2013-02-09 DIAGNOSIS — I509 Heart failure, unspecified: Secondary | ICD-10-CM | POA: Diagnosis present

## 2013-02-09 DIAGNOSIS — E1142 Type 2 diabetes mellitus with diabetic polyneuropathy: Secondary | ICD-10-CM | POA: Diagnosis present

## 2013-02-09 DIAGNOSIS — I739 Peripheral vascular disease, unspecified: Secondary | ICD-10-CM | POA: Diagnosis present

## 2013-02-09 DIAGNOSIS — N189 Chronic kidney disease, unspecified: Secondary | ICD-10-CM | POA: Diagnosis present

## 2013-02-09 DIAGNOSIS — Z9861 Coronary angioplasty status: Secondary | ICD-10-CM | POA: Diagnosis not present

## 2013-02-09 DIAGNOSIS — N400 Enlarged prostate without lower urinary tract symptoms: Secondary | ICD-10-CM | POA: Diagnosis present

## 2013-02-09 DIAGNOSIS — I4891 Unspecified atrial fibrillation: Secondary | ICD-10-CM | POA: Diagnosis present

## 2013-02-09 DIAGNOSIS — Z950 Presence of cardiac pacemaker: Secondary | ICD-10-CM

## 2013-02-09 DIAGNOSIS — I129 Hypertensive chronic kidney disease with stage 1 through stage 4 chronic kidney disease, or unspecified chronic kidney disease: Secondary | ICD-10-CM | POA: Diagnosis present

## 2013-02-09 DIAGNOSIS — Z5189 Encounter for other specified aftercare: Principal | ICD-10-CM

## 2013-02-09 DIAGNOSIS — E1149 Type 2 diabetes mellitus with other diabetic neurological complication: Secondary | ICD-10-CM | POA: Diagnosis present

## 2013-02-09 DIAGNOSIS — D62 Acute posthemorrhagic anemia: Secondary | ICD-10-CM

## 2013-02-09 DIAGNOSIS — I5022 Chronic systolic (congestive) heart failure: Secondary | ICD-10-CM | POA: Diagnosis present

## 2013-02-09 DIAGNOSIS — I482 Chronic atrial fibrillation, unspecified: Secondary | ICD-10-CM | POA: Diagnosis present

## 2013-02-09 DIAGNOSIS — Z7901 Long term (current) use of anticoagulants: Secondary | ICD-10-CM | POA: Diagnosis not present

## 2013-02-09 DIAGNOSIS — F3289 Other specified depressive episodes: Secondary | ICD-10-CM | POA: Diagnosis present

## 2013-02-09 DIAGNOSIS — G547 Phantom limb syndrome without pain: Secondary | ICD-10-CM | POA: Diagnosis present

## 2013-02-09 DIAGNOSIS — I428 Other cardiomyopathies: Secondary | ICD-10-CM | POA: Diagnosis present

## 2013-02-09 DIAGNOSIS — Z794 Long term (current) use of insulin: Secondary | ICD-10-CM | POA: Diagnosis not present

## 2013-02-09 DIAGNOSIS — Z89612 Acquired absence of left leg above knee: Secondary | ICD-10-CM

## 2013-02-09 DIAGNOSIS — E119 Type 2 diabetes mellitus without complications: Secondary | ICD-10-CM | POA: Diagnosis present

## 2013-02-09 DIAGNOSIS — F329 Major depressive disorder, single episode, unspecified: Secondary | ICD-10-CM | POA: Diagnosis present

## 2013-02-09 DIAGNOSIS — N289 Disorder of kidney and ureter, unspecified: Secondary | ICD-10-CM

## 2013-02-09 DIAGNOSIS — E1165 Type 2 diabetes mellitus with hyperglycemia: Secondary | ICD-10-CM

## 2013-02-09 DIAGNOSIS — F17200 Nicotine dependence, unspecified, uncomplicated: Secondary | ICD-10-CM | POA: Diagnosis present

## 2013-02-09 DIAGNOSIS — E785 Hyperlipidemia, unspecified: Secondary | ICD-10-CM | POA: Diagnosis present

## 2013-02-09 DIAGNOSIS — L98499 Non-pressure chronic ulcer of skin of other sites with unspecified severity: Secondary | ICD-10-CM

## 2013-02-09 DIAGNOSIS — S78119A Complete traumatic amputation at level between unspecified hip and knee, initial encounter: Secondary | ICD-10-CM

## 2013-02-09 DIAGNOSIS — I2510 Atherosclerotic heart disease of native coronary artery without angina pectoris: Secondary | ICD-10-CM | POA: Diagnosis present

## 2013-02-09 DIAGNOSIS — I10 Essential (primary) hypertension: Secondary | ICD-10-CM | POA: Diagnosis present

## 2013-02-09 DIAGNOSIS — G609 Hereditary and idiopathic neuropathy, unspecified: Secondary | ICD-10-CM | POA: Diagnosis present

## 2013-02-09 DIAGNOSIS — M109 Gout, unspecified: Secondary | ICD-10-CM | POA: Diagnosis present

## 2013-02-09 LAB — PROTIME-INR
INR: 1.09 (ref 0.00–1.49)
INR: 1.11 (ref 0.00–1.49)
Prothrombin Time: 13.9 seconds (ref 11.6–15.2)

## 2013-02-09 LAB — GLUCOSE, CAPILLARY
Glucose-Capillary: 169 mg/dL — ABNORMAL HIGH (ref 70–99)
Glucose-Capillary: 136 mg/dL — ABNORMAL HIGH (ref 70–99)
Glucose-Capillary: 180 mg/dL — ABNORMAL HIGH (ref 70–99)

## 2013-02-09 MED ORDER — PROCHLORPERAZINE 25 MG RE SUPP
12.5000 mg | Freq: Four times a day (QID) | RECTAL | Status: DC | PRN
  Filled 2013-02-09: qty 1

## 2013-02-09 MED ORDER — DIPHENHYDRAMINE HCL 25 MG PO CAPS: 25.0000 mg | ORAL_CAPSULE | Freq: Three times a day (TID) | ORAL | Status: DC | PRN

## 2013-02-09 MED ORDER — COUMADIN BOOK
Freq: Once | Status: AC
  Administered 2013-02-09: 22:00:00
  Filled 2013-02-09: qty 1

## 2013-02-09 MED ORDER — FLEET ENEMA 7-19 GM/118ML RE ENEM: 1.0000 | ENEMA | Freq: Once | RECTAL | Status: AC | PRN

## 2013-02-09 MED ORDER — ENOXAPARIN SODIUM 30 MG/0.3ML ~~LOC~~ SOLN
30.0000 mg | Freq: Once | SUBCUTANEOUS | Status: AC
  Administered 2013-02-09: 30 mg via SUBCUTANEOUS
  Filled 2013-02-09: qty 0.3

## 2013-02-09 MED ORDER — FINASTERIDE 5 MG PO TABS
5.0000 mg | ORAL_TABLET | Freq: Every day | ORAL | Status: DC
  Administered 2013-02-10 – 2013-02-15 (×6): 5 mg via ORAL
  Filled 2013-02-09 (×7): qty 1

## 2013-02-09 MED ORDER — SENNOSIDES-DOCUSATE SODIUM 8.6-50 MG PO TABS: 2.0000 | ORAL_TABLET | Freq: Every evening | ORAL | Status: DC | PRN

## 2013-02-09 MED ORDER — LOSARTAN POTASSIUM 50 MG PO TABS
100.0000 mg | ORAL_TABLET | Freq: Every day | ORAL | Status: DC
  Administered 2013-02-10 – 2013-02-15 (×6): 100 mg via ORAL
  Filled 2013-02-09 (×7): qty 2

## 2013-02-09 MED ORDER — TRAZODONE HCL 50 MG PO TABS: 25.0000 mg | ORAL_TABLET | Freq: Every evening | ORAL | Status: DC | PRN

## 2013-02-09 MED ORDER — WARFARIN VIDEO: Freq: Once | Status: DC

## 2013-02-09 MED ORDER — INSULIN ASPART PROT & ASPART (70-30 MIX) 100 UNIT/ML ~~LOC~~ SUSP
7.0000 [IU] | Freq: Two times a day (BID) | SUBCUTANEOUS | Status: DC
  Administered 2013-02-10 – 2013-02-14 (×9): 7 [IU] via SUBCUTANEOUS
  Filled 2013-02-09: qty 10

## 2013-02-09 MED ORDER — OXYCODONE-ACETAMINOPHEN 5-325 MG PO TABS
1.0000 | ORAL_TABLET | ORAL | Status: DC | PRN
  Administered 2013-02-09 – 2013-02-15 (×15): 1 via ORAL
  Filled 2013-02-09 (×17): qty 1

## 2013-02-09 MED ORDER — PANTOPRAZOLE SODIUM 40 MG PO TBEC
40.0000 mg | DELAYED_RELEASE_TABLET | Freq: Every day | ORAL | Status: DC
  Administered 2013-02-10 – 2013-02-15 (×6): 40 mg via ORAL
  Filled 2013-02-09 (×5): qty 1

## 2013-02-09 MED ORDER — PROCHLORPERAZINE MALEATE 5 MG PO TABS
5.0000 mg | ORAL_TABLET | Freq: Four times a day (QID) | ORAL | Status: DC | PRN
  Filled 2013-02-09: qty 2

## 2013-02-09 MED ORDER — GUAIFENESIN-DM 100-10 MG/5ML PO SYRP: 15.0000 mL | ORAL_SOLUTION | ORAL | Status: DC | PRN

## 2013-02-09 MED ORDER — ACETAMINOPHEN 325 MG PO TABS
325.0000 mg | ORAL_TABLET | ORAL | Status: DC | PRN
  Administered 2013-02-13 – 2013-02-14 (×3): 650 mg via ORAL
  Filled 2013-02-09 (×3): qty 2

## 2013-02-09 MED ORDER — CARVEDILOL 6.25 MG PO TABS
6.2500 mg | ORAL_TABLET | Freq: Two times a day (BID) | ORAL | Status: DC
  Administered 2013-02-10 – 2013-02-15 (×11): 6.25 mg via ORAL
  Filled 2013-02-09 (×13): qty 1

## 2013-02-09 MED ORDER — GUAIFENESIN-DM 100-10 MG/5ML PO SYRP: 5.0000 mL | ORAL_SOLUTION | Freq: Four times a day (QID) | ORAL | Status: DC | PRN

## 2013-02-09 MED ORDER — WARFARIN SODIUM 2.5 MG PO TABS
2.5000 mg | ORAL_TABLET | Freq: Once | ORAL | Status: AC
  Administered 2013-02-09: 2.5 mg via ORAL
  Filled 2013-02-09: qty 1

## 2013-02-09 MED ORDER — BISACODYL 10 MG RE SUPP: 10.0000 mg | Freq: Every day | RECTAL | Status: DC | PRN

## 2013-02-09 MED ORDER — GABAPENTIN 600 MG PO TABS
600.0000 mg | ORAL_TABLET | Freq: Every day | ORAL | Status: DC
  Administered 2013-02-09 – 2013-02-10 (×2): 600 mg via ORAL
  Filled 2013-02-09 (×3): qty 1

## 2013-02-09 MED ORDER — WARFARIN SODIUM 7.5 MG PO TABS
7.5000 mg | ORAL_TABLET | Freq: Once | ORAL | Status: DC
  Filled 2013-02-09: qty 1

## 2013-02-09 MED ORDER — MAGNESIUM HYDROXIDE 400 MG/5ML PO SUSP
30.0000 mL | Freq: Two times a day (BID) | ORAL | Status: DC | PRN
  Administered 2013-02-09: 30 mL via ORAL
  Filled 2013-02-09: qty 30

## 2013-02-09 MED ORDER — ATORVASTATIN CALCIUM 10 MG PO TABS
10.0000 mg | ORAL_TABLET | Freq: Every day | ORAL | Status: DC
  Administered 2013-02-09 – 2013-02-14 (×6): 10 mg via ORAL
  Filled 2013-02-09 (×7): qty 1

## 2013-02-09 MED ORDER — DOCUSATE SODIUM 100 MG PO CAPS
100.0000 mg | ORAL_CAPSULE | Freq: Every day | ORAL | Status: DC
Start: 2013-02-10 — End: 2013-02-15
  Administered 2013-02-10 – 2013-02-15 (×6): 100 mg via ORAL
  Filled 2013-02-09 (×7): qty 1

## 2013-02-09 MED ORDER — POLYETHYLENE GLYCOL 3350 17 G PO PACK
17.0000 g | PACK | Freq: Every day | ORAL | Status: DC
  Administered 2013-02-09 – 2013-02-15 (×7): 17 g via ORAL
  Filled 2013-02-09 (×8): qty 1

## 2013-02-09 MED ORDER — WARFARIN - PHARMACIST DOSING INPATIENT
Freq: Every day | Status: DC
  Administered 2013-02-11: 18:00:00

## 2013-02-09 MED ORDER — NITROGLYCERIN 0.4 MG SL SUBL: 0.4000 mg | SUBLINGUAL_TABLET | SUBLINGUAL | Status: DC | PRN

## 2013-02-09 MED ORDER — ENOXAPARIN SODIUM 30 MG/0.3ML ~~LOC~~ SOLN: 30.0000 mg | SUBCUTANEOUS | Status: DC

## 2013-02-09 MED ORDER — PHENOL 1.4 % MT LIQD: 1.0000 | OROMUCOSAL | Status: DC | PRN

## 2013-02-09 MED ORDER — ALUM & MAG HYDROXIDE-SIMETH 200-200-20 MG/5ML PO SUSP
30.0000 mL | ORAL | Status: DC | PRN
  Filled 2013-02-09: qty 30

## 2013-02-09 MED ORDER — TRAMADOL HCL 50 MG PO TABS
50.0000 mg | ORAL_TABLET | Freq: Four times a day (QID) | ORAL | Status: DC | PRN
  Filled 2013-02-09: qty 1

## 2013-02-09 MED ORDER — FUROSEMIDE 40 MG PO TABS
40.0000 mg | ORAL_TABLET | Freq: Every day | ORAL | Status: DC
  Administered 2013-02-10 – 2013-02-15 (×6): 40 mg via ORAL
  Filled 2013-02-09 (×7): qty 1

## 2013-02-09 MED ORDER — PROCHLORPERAZINE EDISYLATE 5 MG/ML IJ SOLN
5.0000 mg | Freq: Four times a day (QID) | INTRAMUSCULAR | Status: DC | PRN
  Filled 2013-02-09: qty 2

## 2013-02-09 MED ORDER — HYDROCODONE-ACETAMINOPHEN 5-325 MG PO TABS
1.0000 | ORAL_TABLET | Freq: Four times a day (QID) | ORAL | Status: DC | PRN
  Administered 2013-02-10 – 2013-02-12 (×5): 1 via ORAL
  Filled 2013-02-09 (×5): qty 1

## 2013-02-09 MED ORDER — CEPHALEXIN 500 MG PO CAPS
500.0000 mg | ORAL_CAPSULE | Freq: Three times a day (TID) | ORAL | Status: DC
  Administered 2013-02-09 – 2013-02-15 (×17): 500 mg via ORAL
  Filled 2013-02-09 (×20): qty 1

## 2013-02-09 MED ORDER — ENOXAPARIN SODIUM 80 MG/0.8ML ~~LOC~~ SOLN
80.0000 mg | Freq: Two times a day (BID) | SUBCUTANEOUS | Status: DC
  Filled 2013-02-09: qty 0.8

## 2013-02-09 MED ORDER — GABAPENTIN 300 MG PO CAPS
300.0000 mg | ORAL_CAPSULE | Freq: Every day | ORAL | Status: DC
  Administered 2013-02-10 – 2013-02-11 (×2): 300 mg via ORAL
  Filled 2013-02-09 (×3): qty 1

## 2013-02-09 NOTE — H&P (Signed)
Physical Medicine and Rehabilitation Admission H&P  CC: L-AKA due to PVD with non-healing wounds.  HPI: [name] is a 78 y.o. right-handed male CAF--chronic Coumadin, CAD w/ICM and ICD/PPM, PVD with LLE ischemia and non healing wounds as well as DM type 2 with neuropathy. Patient with multiple attempts at revascularization and non reconstructable disease. He was admitted on 02/04/2013 for left AKA by Dr. Hart Rochester. Chronic Coumadin resumed post and Neurontin added for phantom pain. He is has had hypoglycemic episodes in the past 24 hours. Post op on keflex for wound prophylaxis--d/c today. Therapies ongoing and CIR recommended by therapy and MD.  Insurance has finally given approval and patient admitted today.  Review of Systems  Constitutional: Positive for malaise/fatigue.  HENT: Negative for hearing loss.  Eyes: Negative for blurred vision and double vision.  Respiratory: Negative for cough and shortness of breath.  Cardiovascular: Negative for palpitations.  Gastrointestinal: Positive for constipation. Negative for nausea and vomiting.  Genitourinary: Negative for urgency and frequency.  Musculoskeletal: Negative for joint pain and myalgias.  L-AKA sensitivity/ phantom pain.  Neurological: Negative for dizziness and headaches.   Past Medical History   Diagnosis  Date   .  Hyperlipidemia    .  Gout    .  Benign prostatic hypertrophy    .  Atrial fibrillation      sees Dr. Verdis Prime   .  Chronic systolic dysfunction of left ventricle      a. mixed ischemic and nonischemic CM, EF 35%. b. s/p AICD implantation.   .  CHF (congestive heart failure)    .  ED (erectile dysfunction)    .  Arthritis    .  Pacemaker      medtronic   .  CAD (coronary artery disease)      a. s/p mid LAD stenting with DES 2008 Dr. Verdis Prime   .  ICD (implantable cardiac defibrillator) in place      medtronic, Dr. Johney Frame   .  ICD (implantable cardiac defibrillator) in place      due for check in  Feb/2013   .  Sleep apnea      hx of "had surgery for"   .  Hypertension      sees Dr. Claris Che   .  Type II diabetes mellitus    .  Automatic implantable cardioverter-defibrillator in situ    .  Depression    .  Numbness and tingling      Hx; 75f left foot   .  PAD (peripheral artery disease)      Severe by PV angiogram 09/2011   .  Renal artery stenosis      s/p stenting 2009   .  PAD (peripheral artery disease)      s/p multiple LLE bypass grafts; left mid-distal SCA occlusion by 08/2012 duplex    Past Surgical History   Procedure  Laterality  Date   .  Turp vaporization     .  Cardiac defibrillator placement   12/26/09     pacemaker combo   .  Cervical epidural injection   2013   .  Femoral-tibial bypass graft   09/25/2011     Procedure: BYPASS GRAFT FEMORAL-TIBIAL ARTERY; Surgeon: Pryor Ochoa, MD; Location[facility] OR; Service: Vascular; Laterality: Left; Left Femoral - Anterior Tibial Bypass; saphenous vein graft from left leg   .  Intraoperative arteriogram   09/25/2011     Procedure: INTRA OPERATIVE ARTERIOGRAM; Surgeon: Quita Skye  Hart Rochester, MD; Location: [facility] OR; Service: Vascular; Laterality: Left;   .  Femoral-tibial bypass graft   02/07/2012     Procedure: BYPASS GRAFT FEMORAL-TIBIAL ARTERY; Surgeon: Larina Earthly, MD; Location: [facility] OR; Service: Vascular; Laterality: Left; Thrombectomy Left Femoral - Tibial Bypass Graft   .  Embolectomy   02/07/2012     Procedure: EMBOLECTOMY; Surgeon: Larina Earthly, MD; Location: [facility] OR; Service: Vascular; Laterality: Left;   .  Femoral-tibial bypass graft   04/03/2012     Procedure: BYPASS GRAFT FEMORAL-TIBIAL ARTERY; Surgeon: Pryor Ochoa, MD; Location: [facility] OR; Service: Vascular; Laterality: Left; Redo   .  Insert / replace / remove pacemaker   2007   .  Coronary angioplasty with stent placement   ~ 2000   .  Coronary angioplasty     .  Uvulopalatopharyngoplasty (uppp)/tonsillectomy/septoplasty   06/30/2003     [name] 06/30/2003 (07/10/2012)   .  Shoulder  open rotator cuff repair  Right  2001     repair of lacerated right/notes 10/11/1999 (07/10/2012)   .  Foot surgery  Right  03/20/2001     "have plates and screws in; didn't break it" (07/10/2012)   .  Carpal tunnel release  Right  2002     [name] 03/20/2001 (07/10/2012)   .  Biceps tendon repair  Right  2001     [name] 03/20/2001 (07/10/2012)   .  Renal artery stent   2009   .  Femoral-popliteal bypass graft  Left  09/16/2012     Procedure: LEFT FEMORAL-POPLITEAL BYPASS GRAFT WITH GORTEX Propaten Graft 6x80 Thin Wall and Left lower leg Angiogram; Surgeon: Pryor Ochoa, MD; Location: [facility] OR; Service: Vascular; Laterality: Left;   .  Colonoscopy       Hx; of   .  Tonsillectomy     .  Adenoidectomy       Hx; of   .  Femoral-tibial bypass graft  Left  09/30/2012     Procedure: REDO LEFT FEMORAL-ANTERIOR TIBIAL ARTERY BYPASS USING COMPOSITE CEPHALIC AND BASILIC VEIN GRAFT FROM LEFT ARM; Surgeon: Pryor Ochoa, MD; Location: [facility] OR; Service: Vascular; Laterality: Left;   .  I&d extremity  Left  10/21/2012     Procedure: EXPLORATION AND DEBRIDEMENT OF LEFT GROIN WOUND; Surgeon: Pryor Ochoa, MD; Location: [facility] OR; Service: Vascular; Laterality: Left;   .  Patch angioplasty  Left  10/21/2012     Procedure: PATCH ANGIOPLASTY; Surgeon: Pryor Ochoa, MD; Location: [facility] OR; Service: Vascular; Laterality: Left;   .  Groin debridement  Left  11/12/2012     Procedure: CLOSURE INGUINAL WOUND; Surgeon: Pryor Ochoa, MD; Location: [facility] OR; Service: Vascular; Laterality: Left;   .  Embolectomy  Left  12/16/2012     Procedure: THROMBECTOMY LEFT LEG BYPASS; Surgeon: Sherren Kerns, MD; Location: [facility] OR; Service: Vascular; Laterality: Left;   .  Leg amputation above knee  Left  02/04/2013     DR LAWSON    Family History   Problem  Relation  Age of Onset   .  Cancer       breast/fhx   .  Heart disease       fhx   .  Diabetes  Neg Hx    .  Cancer  Father     Social History: Lives alone. 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 Son from [place] to come down to assist past discharge? He smokes 1-2 cigars when golfing. He has never  used smokeless tobacco. He reports that he drinks about 2.4 ounces of alcohol (2 glasses of wine and 2 shots of liquor) per week. He reports that he does not use illicit drugs.  Allergies   Allergen  Reactions   .  Other  Other (See Comments)     Plastic tape tears skin    Medications Prior to Admission   Medication  Sig  Dispense  Refill   .  atorvastatin (LIPITOR) 10 MG tablet  Take 1 tablet (10 mg total) by mouth daily.  30 tablet  4   .  carvedilol (COREG) 6.25 MG tablet  Take 1 tablet (6.25 mg total) by mouth 2 (two) times daily with a meal.  180 tablet  3   .  cephALEXin (KEFLEX) 500 MG capsule  Take 1 capsule (500 mg total) by mouth 3 (three) times daily.  63 capsule  0   .  cephALEXin (KEFLEX) 500 MG capsule  Take 500 mg by mouth 3 (three) times daily. Started 11-20; 10 day course     .  enoxaparin (LOVENOX) 80 MG/0.8ML injection  Inject 0.8 mLs (80 mg total) into the skin daily.  10 Syringe  0   .  finasteride (PROSCAR) 5 MG tablet  Take 5 mg by mouth daily.     .  furosemide (LASIX) 40 MG tablet  Take 1 tablet (40 mg total) by mouth daily.  90 tablet  3   .  gabapentin (NEURONTIN) 300 MG capsule  Take 300-600 mg by mouth 2 (two) times daily. Takes 300 mg (1 capsule) in the morning and 600 mg (2 capsules) at night     .  HYDROcodone-acetaminophen (NORCO/VICODIN) 5-325 MG per tablet  Take 1 tablet by mouth every 6 (six) hours as needed.  40 tablet  0   .  insulin aspart protamine-insulin aspart (NOVOLOG 70/30) (70-30) 100 UNIT/ML injection  Inject 10 Units into the skin 2 (two) times daily with a meal. 10 units with breakfast, and 10 units with the evening meal     .  losartan (COZAAR) 100 MG tablet  Take 1 tablet (100 mg total) by mouth daily.  90 tablet  3   .  NITROSTAT 0.4 MG SL tablet  Place 0.4 mg under the tongue every 5 (five) minutes as needed for chest pain. For  chest pain, max 3 doses     .  oxyCODONE-acetaminophen (PERCOCET/ROXICET) 5-325 MG per tablet  Take 1-2 tablets every 4-6 hrs./ prn for severe pain  30 tablet  0   .  warfarin (COUMADIN) 5 MG tablet  Take 2.5 mg by mouth See admin instructions. Takes 2.5 mg daily EXCEPT for none on Thursday      Home:  Home Living  Family/patient expects to be discharged to:: Private residence  Living Arrangements: Alone  Available Help at Discharge: Family;Available 24 hours/day (son from [place], planning to come down)  Type of Home: House  Home Access: Level entry  Home Layout: One level  Home Equipment: Wheelchair - manual;Cane - single point;Crutches;Shower seat (walk in shower; W/C borrowed from a neighbor)  Additional Comments: son states bathroom door only 24" wide, is planning to put in a wider door  Functional History:  Prior Function  Comments: used cane PTA; works [employer] features editor Status:  Mobility:  Bed Mobility  Bed Mobility: Supine to Sit;Sitting - Scoot to Edge of Bed;Sit to Supine  Supine to Sit: 3: Mod assist;HOB elevated;With rails  Sitting - Scoot to  Edge of Bed: 3: Mod assist  Sit to Supine: 4: Min assist  Transfers  Transfers: Sit to Stand;Stand to Sit  Sit to Stand: 3: Mod assist;With upper extremity assist  Stand to Sit: 4: Min assist  Ambulation/Gait  Ambulation/Gait Assistance: 4: Min assist  Ambulation Distance (Feet): 2 Feet  Assistive device: Rolling walker  Ambulation/Gait Assistance Details: vc for technique and sequencing; pt required multiple attempts to get confidence to take the first step with following steps much easier as he gained confidence  Gait Pattern: Step-to pattern   ADL:  ADL  Eating/Feeding: Performed;Independent  Where Assessed - Eating/Feeding: Chair  Grooming: Performed;Set up  Where Assessed - Grooming: Unsupported sitting  Upper Body Bathing: Simulated;Set up  Where Assessed - Upper Body Bathing: Supported sitting  Lower Body  Bathing: Simulated;Moderate assistance  Where Assessed - Lower Body Bathing: Supported sit to stand  Upper Body Dressing: Performed;Set up  Where Assessed - Upper Body Dressing: Supported sitting  Lower Body Dressing: Performed;Moderate assistance  Where Assessed - Lower Body Dressing: Supported sit to stand  Toilet Transfer: Performed;Minimal Psychologist, clinical Method: Archivist: Bedside commode  Equipment Used: Rolling walker  Transfers/Ambulation Related to ADLs: Pt did well with pivot transfers to chair. Encouraged to take his time and weight bear through his arms to make it easier to hop on foot with walker.  ADL Comments: Pt did well with adls in the chair. Pt needs assist in standing. Pt "feels" his L leg w phantom sensations and sometimes lets go of walker with both hands when standing to do adls.  Cognition:  Cognition  Overall Cognitive Status: Within Functional Limits for tasks assessed  Orientation Level: Oriented X4  Cognition  Arousal/Alertness: Awake/alert  Behavior During Therapy: WFL for tasks assessed/performed  Overall Cognitive Status: Within Functional Limits for tasks assessed    Physical Exam:  Blood pressure 158/76, pulse 69, temperature 98.3 F (36.8 C), temperature source Oral, resp. rate 18, SpO2 97.00%.   Constitutional: He is oriented to person, place, and time. He appears well-developed and well-nourished.  HENT:  Head: Normocephalic and atraumatic.  Eyes: Conjunctivae are normal. Pupils are equal, round, and reactive to light.  Neck: Neck supple.  Cardiovascular: Normal rate and regular rhythm. No rubs, gallops No murmur heard.  Respiratory: Effort normal and breath sounds normal. No respiratory distress. He has no wheezes.  GI: Soft. Bowel sounds are normal. He exhibits no distension. There is no tenderness.  Musculoskeletal:  L-AKA with staples intact--clean, dry, edematous with resolving ecchymosis.  Appropriately tender to touch. Neurological: He is alert and oriented to person, place, and time.  Follows commands. Strength grossly intact in UE's at 5/5. RLE is 4/5 prox to 5/5 distally. Left HF 2+/5.   mild sensory loss in distal right leg/foot.    Skin:  Intact. RLE and BUE with old well-healed incisions.  Psychiatric: He has a normal mood and affect. His speech is normal and behavior is normal. Judgment and thought content normal. Cognition and memory are normal.     Results for orders placed during the hospital encounter of 02/04/13 (from the past 48 hour(s))   GLUCOSE, CAPILLARY Status: Abnormal    Collection Time    02/04/13 5:56 AM   Result  Value  Range    Glucose-Capillary  145 (*)  70 - 99 mg/dL   APTT Status: None    Collection Time    02/04/13 6:02 AM   Result  Value  Range    aPTT  34  24 - 37 seconds   PROTIME-INR Status: None    Collection Time    02/04/13 6:02 AM   Result  Value  Range    Prothrombin Time  14.0  11.6 - 15.2 seconds    INR  1.10  0.00 - 1.49   GLUCOSE, CAPILLARY Status: Abnormal    Collection Time    02/04/13 9:08 AM   Result  Value  Range    Glucose-Capillary  137 (*)  70 - 99 mg/dL    Comment 1  Documented in Chart     Comment 2  Notify RN    GLUCOSE, CAPILLARY Status: Abnormal    Collection Time    02/04/13 1:23 PM   Result  Value  Range    Glucose-Capillary  127 (*)  70 - 99 mg/dL   GLUCOSE, CAPILLARY Status: Abnormal    Collection Time    02/04/13 4:05 PM   Result  Value  Range    Glucose-Capillary  125 (*)  70 - 99 mg/dL   GLUCOSE, CAPILLARY Status: Abnormal    Collection Time    02/04/13 9:33 PM   Result  Value  Range    Glucose-Capillary  116 (*)  70 - 99 mg/dL   BASIC METABOLIC PANEL Status: Abnormal    Collection Time    02/05/13 3:53 AM   Result  Value  Range    Sodium  140  135 - 145 mEq/L    Potassium  4.5  3.5 - 5.1 mEq/L    Chloride  106  96 - 112 mEq/L    CO2  23  19 - 32 mEq/L    Glucose, Bld  119 (*)  70 - 99  mg/dL    BUN  29 (*)  6 - 23 mg/dL    Creatinine, Ser  7.82 (*)  0.50 - 1.35 mg/dL    Calcium  8.7  8.4 - 10.5 mg/dL    GFR calc non Af Amer  38 (*)  >90 mL/min    GFR calc Af Amer  44 (*)  >90 mL/min    Comment:  (NOTE)     The eGFR has been calculated using the CKD EPI equation.     This calculation has not been validated in all clinical situations.     eGFR's persistently <90 mL/min signify possible Chronic Kidney     Disease.   CBC Status: Abnormal    Collection Time    02/05/13 3:53 AM   Result  Value  Range    WBC  9.3  4.0 - 10.5 K/uL    RBC  4.17 (*)  4.22 - 5.81 MIL/uL    Hemoglobin  11.0 (*)  13.0 - 17.0 g/dL    HCT  95.6 (*)  21.3 - 52.0 %    MCV  82.7  78.0 - 100.0 fL    MCH  26.4  26.0 - 34.0 pg    MCHC  31.9  30.0 - 36.0 g/dL    RDW  08.6  57.8 - 46.9 %    Platelets  221  150 - 400 K/uL   PROTIME-INR Status: None    Collection Time    02/05/13 3:53 AM   Result  Value  Range    Prothrombin Time  13.8  11.6 - 15.2 seconds    INR  1.08  0.00 - 1.49   GLUCOSE, CAPILLARY Status: Abnormal    Collection Time  02/05/13 6:43 AM   Result  Value  Range    Glucose-Capillary  121 (*)  70 - 99 mg/dL    No results found.  Post Admission Physician Evaluation:  1. Functional deficits secondary to left AKA. 2. Patient is admitted to receive collaborative, interdisciplinary care between the physiatrist, rehab nursing staff, and therapy team. 3. Patient's level of medical complexity and substantial therapy needs in context of that medical necessity cannot be provided at a lesser intensity of care such as a SNF. 4. Patient has experienced substantial functional loss from his/her baseline which was documented above under the "Functional History" and "Functional Status" headings. Judging by the patient's diagnosis, physical exam, and functional history, the patient has potential for functional progress which will result in measurable gains while on inpatient rehab. These gains will  be of substantial and practical use upon discharge in facilitating mobility and self-care at the household level. 5. Physiatrist will provide 24 hour management of medical needs as well as oversight of the therapy plan/treatment and provide guidance as appropriate regarding the interaction of the two. 6. 24 hour rehab nursing will assist with bladder management, bowel management, safety, skin/wound care, disease management, medication administration, pain management and patient education and help integrate therapy concepts, techniques,education, etc. 7. PT will assess and treat for/with: Lower extremity strength, range of motion, stamina, balance, functional mobility, safety, adaptive techniques and equipment, pain mgt, pre-prosthetic education. Goals are: mod I with basic mobility and self-care. 8. OT will assess and treat for/with: ADL's, functional mobility, safety, upper extremity strength, adaptive techniques and equipment, pre-pros education. Goals are: mod I. 9. SLP will assess and treat for/with: n/a. Goals are: n/a. 10. Case Management and Social Worker will assess and treat for psychological issues and discharge planning. 11. Team conference will be held weekly to assess progress toward goals and to determine barriers to discharge. 12. Patient will receive at least 3 hours of therapy per day at least 5 days per week. 13. ELOS: 10-12 days  14. Prognosis: excellent   Medical Problem List and Plan:  1. DVT Prophylaxis/Anticoagulation: Pharmaceutical: Coumadin resumed but still sub therapeutic--continue lovenox till INR > 2.0.  2. Pain Management: Continue neurontin for diabetic neuropathy.  3. H/o depression/ Mood: Seems a little anxious. Answered questions regarding healing and time frame of prosthesis. Will monitor for now and have LCSW follow for evaluation.  4. Neuropsych: This patient is capable of making decisions on his own behalf.  5. CAD with ICD/PPM: Monitor for any symptoms with  increase in activity level. Continue coreg, lasix, cozaar and Lipitor.  6. DM type 2 with neuropathy: CM diet. Will decrease 70/30 insulin to avoid hypoglycemia--patient refused am dose today. SSI as needed for elevated BS. Will check BS ac/hs levels for close monitoring and further adjustment as indicated.  7. A fib: Will monitor HR with bid checks. Continue coreg bid. Chronic coumadin resumed.  8. Chronic systolic CHF: Monitor weights qod. Low salt restrictions. Continue cozaar, lasix, coreg and lipitor.  9. BPH: Continue proscar. Monitor for voiding problems post op.  10. ABLA: Will monitor for now. Recheck CBC in am.  11. CKD?: baseline BUN/Cr--40/2.0+ range per records.  12. Wound: I have requested a stump sock per [facility]. Have also asked [facility] to provide prosthetic information.  Ranelle Oyster, MD, [facility] Physical Medicine & Rehabilitation   02/05/2013

## 2013-02-09 NOTE — PMR Pre-admission (Signed)
PMR Admission Coordinator Pre-Admission Assessment  Patient: [name] is an 78 y.o., male MRN: [id number] DOB: 23-Dec-1934 Height: 5' 8.11" (173 cm) Weight: 80.3 kg (177 lb 0.5 oz)              Insurance Information HMO:  Yes    PPO:       PCP:       IPA:       80/20:       OTHER:  Group # [id number] PRIMARY: Blue Medicare      Policy#: [id number]      Subscriber: Kennon Rounds CM Name:                                    Phone#:                            Fax#: [phone] Pre-Cert#:                              Employer: Retired Benefits:  Phone #: [phone]     Name: Hanley Seamen. Date: 03/04/12     Deduct: $0      Out of Pocket Max: $3400(met)      Life Max: unlimited CIR: $220 days 1-7      SNF: $0 days 1-10, $50 days 11-100 Outpatient: No visit limits     Co-Pay: $40 Home Health: 100%      Co-Pay: none DME: 80%     Co-Pay: 20% Providers: in network  SECONDARY: A[employer]      Policy#: [id number]      Subscriber: Darryll Capers CM Name:        Phone#:       Fax#:   Pre-Cert#:        Employer: Retired Benefits:  Phone #: [phone]     Name:   Eff. Date:       Deduct:        Out of Pocket Max:        Life Max:   CIR:        SNF:   Outpatient:       Co-Pay:   Home Health:        Co-Pay:   DME:       Co-Pay:    Emergency Contact Information Contact Information   Name Relation Home Work Mobile   [place] Son   [phone]   Roberth, Berling Daughter [phone]  [phone]   [place], Memmer   [phone]     Current Medical History  Patient Admitting Diagnosis:  L AKA  History of Present Illness:  A 78 y.o. right-handed male CAF--chronic Coumadin, CAD w/ICM and ICD/PPM, PVD with LLE ischemia and non healing wounds as well as DM type 2 with neuropathy. Patient with multiple attempts at revascularization and non reconstructable disease. He was admitted on 02/04/2013 for left AKA by Dr. Hart Rochester. Chronic Coumadin resumed post and Neurontin added for phantom  pain. He is has had hypoglycemic episodes in the past 24 hours. On keflex for wound prophylaxis. Therapies ongoing and CIR recommended by therapy and MD.  Insurance has given approval and patient will be admitted today.     Past Medical History  Past Medical History  Diagnosis Date  . Hyperlipidemia   . Gout   . Benign prostatic hypertrophy   . Atrial  fibrillation     sees Dr. Verdis Prime  . Chronic systolic dysfunction of left ventricle     a. mixed ischemic and nonischemic CM,  EF 35%. b. s/p AICD implantation.  . CHF (congestive heart failure)   . ED (erectile dysfunction)   . Arthritis   . Pacemaker     medtronic  . CAD (coronary artery disease)     a. s/p mid LAD stenting with DES 2008 Dr. Verdis Prime  . ICD (implantable cardiac defibrillator) in place     medtronic, Dr. Johney Frame  . ICD (implantable cardiac defibrillator) in place     due for check in Feb/2013  . Sleep apnea     hx of "had surgery for"  . Hypertension     sees Dr. Claris Che  . Type II diabetes mellitus   . Automatic implantable cardioverter-defibrillator in situ   . Depression   . Numbness and tingling     Hx; 80f left foot  . PAD (peripheral artery disease)     Severe by PV angiogram 09/2011  . Renal artery stenosis     s/p stenting 2009  . PAD (peripheral artery disease)     s/p multiple LLE bypass grafts; left mid-distal SCA occlusion by 08/2012 duplex    Family History  family history includes Cancer in his father and another family member; Heart disease in an other family member. There is no history of Diabetes.  Prior Rehab/Hospitalizations:  None   Current Medications  Current facility-administered medications:acetaminophen (TYLENOL) suppository 325-650 mg, 325-650 mg, Rectal, Q4H PRN, Pryor Ochoa, MD;  acetaminophen (TYLENOL) tablet 325-650 mg, 325-650 mg, Oral, Q4H PRN, Pryor Ochoa, MD;  atorvastatin (LIPITOR) tablet 10 mg, 10 mg, Oral, Daily, Pryor Ochoa, MD, 10 mg at 02/08/13 9604;   bisacodyl (DULCOLAX) suppository 10 mg, 10 mg, Rectal, Daily PRN, Pryor Ochoa, MD, 10 mg at 02/09/13 0659 carvedilol (COREG) tablet 6.25 mg, 6.25 mg, Oral, BID WC, Pryor Ochoa, MD, 6.25 mg at 02/09/13 0630;  cephALEXin (KEFLEX) capsule 500 mg, 500 mg, Oral, TID, Pryor Ochoa, MD, 500 mg at 02/09/13 1132;  dextrose (GLUTOSE) 40 % oral gel 37.5 g, 1 Tube, Oral, PRN, Pryor Ochoa, MD;  diphenhydrAMINE (BENADRYL) capsule 25 mg, 25 mg, Oral, Q8H PRN, Pryor Ochoa, MD, 25 mg at 02/08/13 5409 docusate sodium (COLACE) capsule 100 mg, 100 mg, Oral, Daily, Pryor Ochoa, MD, 100 mg at 02/09/13 1132;  enoxaparin (LOVENOX) injection 30 mg, 30 mg, Subcutaneous, Q24H, Samantha J Rhyne, PA-C, 30 mg at 02/09/13 0601;  finasteride (PROSCAR) tablet 5 mg, 5 mg, Oral, Daily, Pryor Ochoa, MD, 5 mg at 02/09/13 1133;  furosemide (LASIX) tablet 40 mg, 40 mg, Oral, Daily, Pryor Ochoa, MD, 40 mg at 02/09/13 1133 gabapentin (NEURONTIN) capsule 300 mg, 300 mg, Oral, Daily, Pryor Ochoa, MD, 300 mg at 02/09/13 1133;  gabapentin (NEURONTIN) tablet 600 mg, 600 mg, Oral, QHS, Pryor Ochoa, MD, 600 mg at 02/08/13 2119;  guaiFENesin-dextromethorphan (ROBITUSSIN DM) 100-10 MG/5ML syrup 15 mL, 15 mL, Oral, Q4H PRN, Pryor Ochoa, MD;  hydrALAZINE (APRESOLINE) injection 10 mg, 10 mg, Intravenous, Q2H PRN, Pryor Ochoa, MD HYDROcodone-acetaminophen (NORCO/VICODIN) 5-325 MG per tablet 1 tablet, 1 tablet, Oral, Q6H PRN, Pryor Ochoa, MD;  HYDROmorphone (DILAUDID) injection 0.5-1 mg, 0.5-1 mg, Intravenous, Q2H PRN, Pryor Ochoa, MD, 0.5 mg at 02/08/13 1345;  insulin aspart (novoLOG) injection 0-15 Units, 0-15 Units, Subcutaneous, TID WC, Quita Skye  Hart Rochester, MD, 2 Units at 02/08/13 0653 insulin aspart protamine- aspart (NOVOLOG MIX 70/30) injection 10 Units, 10 Units, Subcutaneous, BID WC, Pryor Ochoa, MD, 10 Units at 02/08/13 1717;  labetalol (NORMODYNE,TRANDATE) injection 10 mg, 10 mg, Intravenous, Q2H PRN, Pryor Ochoa, MD;  losartan (COZAAR) tablet 100 mg, 100 mg, Oral, Daily, Pryor Ochoa, MD, 100 mg at 02/09/13 1133 magnesium hydroxide (MILK OF MAGNESIA) suspension 30 mL, 30 mL, Oral, BID PRN, Pryor Ochoa, MD, 30 mL at 02/09/13 1135;  metoprolol (LOPRESSOR) injection 2-5 mg, 2-5 mg, Intravenous, Q2H PRN, Pryor Ochoa, MD;  nitroGLYCERIN (NITROSTAT) SL tablet 0.4 mg, 0.4 mg, Sublingual, Q5 min PRN, Pryor Ochoa, MD;  ondansetron [facility]) injection 4 mg, 4 mg, Intravenous, Q6H PRN, Pryor Ochoa, MD oxyCODONE-acetaminophen (PERCOCET/ROXICET) 5-325 MG per tablet 1 tablet, 1 tablet, Oral, Q4H PRN, Pryor Ochoa, MD, 1 tablet at 02/09/13 1131;  pantoprazole (PROTONIX) EC tablet 40 mg, 40 mg, Oral, Daily, Pryor Ochoa, MD, 40 mg at 02/09/13 1132;  phenol (CHLORASEPTIC) mouth spray 1 spray, 1 spray, Mouth/Throat, PRN, Pryor Ochoa, MD;  warfarin (COUMADIN) tablet 2.5 mg, 2.5 mg, Oral, Custom, Pryor Ochoa, MD, 2.5 mg at 02/08/13 1719 Warfarin - Physician Dosing Inpatient, , Does not apply, q1800, Drake Leach Rumbarger, RPH  Patients Current Diet: Carb Control  Precautions / Restrictions Precautions Precautions: Fall Precaution Comments: pt having strong phantom sensation that his foot is still there Restrictions Weight Bearing Restrictions: No   Prior Activity Level Community (5-7x/wk): Went out daily, sometimes several times a day.  Home Assistive Devices / Equipment Home Assistive Devices/Equipment: Shower chair with back;Cane (specify quad or straight);Crutches Home Equipment: Wheelchair - manual;Cane - single point;Crutches;Shower seat (walk in shower; W/C borrowed from a neighbor)  Prior Functional Level Prior Function Level of Independence: Independent with assistive device(s) Comments: used cane PTA; works Government social research officer  Overall Cognitive Status: Within Functional Limits for tasks assessed Orientation Level: Oriented X4    Extremity  Assessment (includes Sensation/Coordination)          ADLs  Eating/Feeding: Performed;Independent Where Assessed - Eating/Feeding: Chair Grooming: Performed;Set up Where Assessed - Grooming: Unsupported sitting Upper Body Bathing: Simulated;Set up Where Assessed - Upper Body Bathing: Supported sitting Lower Body Bathing: Simulated;Moderate assistance Where Assessed - Lower Body Bathing: Supported sit to stand Upper Body Dressing: Performed;Set up Where Assessed - Upper Body Dressing: Supported sitting Lower Body Dressing: Performed;Moderate assistance Where Assessed - Lower Body Dressing: Supported sit to stand Toilet Transfer: Performed;Minimal Dentist Method: Surveyor, minerals: Regular height toilet;Grab bars Toileting - Architect and Hygiene: Performed;Min guard Where Assessed - Engineer, mining and Hygiene: Standing Equipment Used: Rolling walker Transfers/Ambulation Related to ADLs: did well with amb., but limited by fatigue through b ues.   ADL Comments: inst. cues for ue support on grab bar in standing and pulling off lb garments using one hand    Mobility  Bed Mobility: Rolling Right Rolling Right: 4: Min assist;With rail Supine to Sit: 5: Supervision;HOB elevated;With rails Sitting - Scoot to Edge of Bed: 5: Supervision;With rail Sit to Supine: 5: Supervision    Transfers  Transfers: Sit to Stand;Stand to Sit Sit to Stand: 4: Min guard;4: Min assist;With upper extremity assist;From bed;From toilet Stand to Sit: 4: Min guard;4: Min assist;With upper extremity assist;With armrests;To bed;To toilet    Ambulation / Gait / Stairs / Wheelchair Mobility  Ambulation/Gait Ambulation/Gait Assistance: 4:  Min assist Ambulation Distance (Feet): 16 Feet (and 6') Assistive device: Rolling walker Ambulation/Gait Assistance Details: assist for keeping walker close, keeping it still as he hops forward, demonstrates  decreased step height as he fatigues Gait Pattern: Decreased stride length;Step-to pattern Gait velocity: decreased Stairs: No Wheelchair Mobility Wheelchair Mobility: No    Posture / Balance Static Sitting Balance Static Sitting - Balance Support: Bilateral upper extremity supported;Feet supported (Rt LE supported) Static Sitting - Level of Assistance: 5: Stand by assistance Static Sitting - Comment/# of Minutes: pt tolerated sitting EOB to use urinal  Static Standing Balance Static Standing - Balance Support: Bilateral upper extremity supported Static Standing - Level of Assistance: 4: Min assist High Level Balance High Level Balance Activites: Backward walking;Side stepping High Level Balance Comments: min (A) to maintain balance with high level activities     Special needs/care consideration BiPAP/CPAP No CPM No Continuous Drip IV No Dialysis No         Life Vest No Oxygen No Special Bed No Trach Size No Wound Vac (area) No     Skin Use paper tape.  Other tape causes skin tears.                              Bowel mgmt: Had BM 02/09/13 Bladder mgmt: Voiding in urinal Diabetic mgmt Yes, on insulin    Previous Home Environment Living Arrangements: Alone Available Help at Discharge: Family;Available 24 hours/day (son from [place], planning to come down) Type of Home: House Home Layout: One level Home Access: Level entry Home Care Services: No Additional Comments: son states bathroom door only 24" wide, is planning to put in a wider door  Discharge Living Setting Plans for Discharge Living Setting: Patient's home;House (Has a townhouse.) Type of Home at Discharge: House Discharge Home Layout: One level Discharge Home Access: Level entry Does the patient have any problems obtaining your medications?: No  Social/Family/Support Systems Patient Roles: Parent (Has a son and a daughter out of town.) Contact Information: [name] - son [phone] Anticipated Caregiver:  self and friends/neighbors Ability/Limitations of Caregiver: Family live out of town.   Caregiver Availability: Intermittent Discharge Plan Discussed with Primary Caregiver: Yes Is Caregiver In Agreement with Plan?: Yes Does Caregiver/Family have Issues with Lodging/Transportation while Pt is in Rehab?: No  Goals/Additional Needs Patient/Family Goal for Rehab: PT/OT mod I W/C goals Expected length of stay: 13-15 days Cultural Considerations: None Dietary Needs: Carb mod med cal, thin liquids Equipment Needs: TBD Pt/Family Agrees to Admission and willing to participate: Yes Program Orientation Provided & Reviewed with Pt/Caregiver Including Roles  & Responsibilities: Yes   Decrease burden of Care through IP rehab admission:  N/A  Possible need for SNF placement upon discharge: Not planned   Patient Condition: This patient's medical and functional status has changed since the consult dated: 02/05/13 in which the Rehabilitation Physician determined and documented that the patient's condition is appropriate for intensive rehabilitative care in an inpatient rehabilitation facility. See "History of Present Illness" (above) for medical update. Functional changes are: Currently ambulating 16 ft RW with min assist . Patient's medical and functional status update has been discussed with the Rehabilitation physician and patient remains appropriate for inpatient rehabilitation. Will admit to inpatient rehab today.  Preadmission Screen Completed By:  Trish Mage, 02/09/2013 12:10 PM ______________________________________________________________________   Discussed status with Dr. Riley Kill on 02/09/13 at 1218 and received telephone approval for admission today.  Admission Coordinator:  Trish Mage, time1218/Date12/09/14

## 2013-02-09 NOTE — Progress Notes (Signed)
Received pt. As a transfer from [facility].Pt. Is from home alone,alert and oriented.No equipment at the bedside.Pt. Was introduce to the unit.Pt. Has an incision with staples open to air on left stump.Keep monitoring pt. Closely and assessing his needs.

## 2013-02-09 NOTE — Discharge Summary (Signed)
[facility] Discharge Summary  [name] 12-31-1934 78 y.o. male  [id number]  Admission Date: 02/04/2013  Discharge Date: 02/09/13  Physician: Pryor Ochoa, MD  Admission Diagnosis: Ischemia of left leg   HPI:   This is a 78 y.o. male returns for continued followup regarding his ischemic left leg. He is not reconstruct was had multiple attempts at revascularization. He is about ready to proceed with left above-knee amputation with chronic numbness and some pain in the left foot.  Hospital Course:  The patient was admitted to the hospital and taken to the operating room on 02/04/2013 and underwent: Left AKA.   The pt tolerated the procedure well and was transported to the PACU in good condition.   His dressing was removed on POD 2.  His stump looked good and was viable.  His drain was removed.  He continued PT throughout his hospitalization.  He was found to be a good candidate for CIR and is admitted there on POD 5.  He was placed on Lovenox 30 mg daily post operatively for DVT prophylaxis. He was not placed on the Lovenox 80 mg daily to bridge to coumadin bc of his worsening renal fxn.  Discussed with Dr. Hart Rochester and he will continue the 30 mg daily at this time until INR is therapeutic.    The remainder of the hospital course consisted of increasing mobilization and increasing intake of solids without difficulty.  CBC    Component Value Date/Time   WBC 7.8 02/06/2013 0432   RBC 4.07* 02/06/2013 0432   HGB 10.7* 02/06/2013 0432   HCT 33.9* 02/06/2013 0432   PLT 193 02/06/2013 0432   MCV 83.3 02/06/2013 0432   MCH 26.3 02/06/2013 0432   MCHC 31.6 02/06/2013 0432   RDW 15.3 02/06/2013 0432   LYMPHSABS 0.9 10/15/2012 1136   MONOABS 0.9 10/15/2012 1136   EOSABS 0.0 10/15/2012 1136   BASOSABS 0.0 10/15/2012 1136    BMET    Component Value Date/Time   NA 136 02/07/2013 0514   K 4.2 02/07/2013 0514   CL 102 02/07/2013 0514   CO2 23 02/07/2013 0514   GLUCOSE  110* 02/07/2013 0514   BUN 39* 02/07/2013 0514   CREATININE 1.90* 02/07/2013 0514   CREATININE 2.68* 09/07/2012 0812   CALCIUM 8.3* 02/07/2013 0514   GFRNONAA 33* 02/07/2013 0514   GFRAA 38* 02/07/2013 0514     Discharge Instructions:   The patient is discharged to home with extensive instructions on wound care and progressive ambulation.  They are instructed not to drive or perform any heavy lifting until returning to see the physician in his office.  Discharge Orders   Future Appointments Provider Department Dept Phone   03/09/2013 9:45 AM Pryor Ochoa, MD [facility] [phone]   03/18/2013 8:35 AM [facility] [phone]   Future Orders Complete By Expires   Call MD for:  redness, tenderness, or signs of infection (pain, swelling, bleeding, redness, odor or green/yellow discharge around incision site)  As directed    Call MD for:  severe or increased pain, loss or decreased feeling  in affected limb(s)  As directed    Call MD for:  temperature >100.5  As directed    Driving Restrictions  As directed    Comments:     No driving for 2 weeks   Lifting restrictions  As directed    Comments:     No lifting  for 4 weeks   may wash over wound with mild soap and water  As directed    Resume previous diet  As directed       Discharge Diagnosis:  Ischemia of left leg  Secondary Diagnosis: Patient Active Problem List   Diagnosis Date Noted  . Nontraumatic ischemic infarction of muscle of lower leg 02/04/2013  . Long term (current) use of anticoagulants 02/01/2013  . Pain, limb, left-Leg 01/21/2013  . Aftercare following surgery of the circulatory system, NEC 01/12/2013  . PVD (peripheral vascular disease) 12/22/2012  . Swelling of limb 10/07/2012  . Subclavian artery stenosis 09/08/2012  . Peripheral vascular disease, unspecified 08/18/2012  . Fever 07/08/2012  . Leg edema 06/02/2012  . Cardiomyopathy,  ischemic 05/18/2012  . S/P ICD (internal cardiac defibrillator) procedure 05/18/2012  . Diabetes 05/18/2012  . Pain in limb 02/07/2012  . Atherosclerosis of native arteries of the extremities with intermittent claudication 12/17/2011  . Atherosclerotic PVD with intermittent claudication 10/15/2011  . Type I (juvenile type) diabetes mellitus with peripheral circulatory disorders, uncontrolled 09/06/2011  . PAD (peripheral artery disease) 09/04/2011  . CAD (coronary artery disease)   . Burning sensation of feet 05/10/2011  . Bursitis of right hip 05/10/2011  . Cervicalgia 05/10/2011  . Balance disorder 05/10/2011  . Neurogenic claudication 05/10/2011  . Lumbago 05/10/2011  . Chronic systolic congestive heart failure 07/06/2010  . ECZEMA 01/17/2010  . RENAL INSUFFICIENCY 11/01/2009  . UTI 11/01/2009  . CELLULITIS, LEG, RIGHT 08/22/2009  . LYMPHADENOPATHY, REACTIVE 08/22/2009  . PERIPHERAL NEUROPATHY 01/24/2009  . DIZZINESS 12/29/2008  . ABDOMINAL PAIN, GENERALIZED 08/22/2008  . GOUT 09/18/2007  . CORONARY ARTERY DISEASE 09/18/2007  . HYPERLIPIDEMIA 01/23/2007  . HYPERTENSION 01/23/2007  . Atrial fibrillation 01/23/2007  . BENIGN PROSTATIC HYPERTROPHY 01/23/2007  . LATERAL EPICONDYLITIS 01/23/2007   Past Medical History  Diagnosis Date  . Hyperlipidemia   . Gout   . Benign prostatic hypertrophy   . Atrial fibrillation     sees Dr. Verdis Prime  . Chronic systolic dysfunction of left ventricle     a. mixed ischemic and nonischemic CM,  EF 35%. b. s/p AICD implantation.  . CHF (congestive heart failure)   . ED (erectile dysfunction)   . Arthritis   . Pacemaker     medtronic  . CAD (coronary artery disease)     a. s/p mid LAD stenting with DES 2008 Dr. Verdis Prime  . ICD (implantable cardiac defibrillator) in place     medtronic, Dr. Johney Frame  . ICD (implantable cardiac defibrillator) in place     due for check in Feb/2013  . Sleep apnea     hx of "had surgery for"  .  Hypertension     sees Dr. Claris Che  . Type II diabetes mellitus   . Automatic implantable cardioverter-defibrillator in situ   . Depression   . Numbness and tingling     Hx; 74f left foot  . PAD (peripheral artery disease)     Severe by PV angiogram 09/2011  . Renal artery stenosis     s/p stenting 2009  . PAD (peripheral artery disease)     s/p multiple LLE bypass grafts; left mid-distal SCA occlusion by 08/2012 duplex      Medication List    STOP taking these medications       cephALEXin 500 MG capsule  Commonly known as:  KEFLEX      TAKE these medications       atorvastatin 10 MG  tablet  Commonly known as:  LIPITOR  Take 1 tablet (10 mg total) by mouth daily.     carvedilol 6.25 MG tablet  Commonly known as:  COREG  Take 1 tablet (6.25 mg total) by mouth 2 (two) times daily with a meal.     enoxaparin 30 MG/0.3ML injection  Commonly known as:  LOVENOX  Inject 0.3 mLs (30 mg total) into the skin daily.     finasteride 5 MG tablet  Commonly known as:  PROSCAR  Take 5 mg by mouth daily.     furosemide 40 MG tablet  Commonly known as:  LASIX  Take 1 tablet (40 mg total) by mouth daily.     gabapentin 300 MG capsule  Commonly known as:  NEURONTIN  Take 300-600 mg by mouth 2 (two) times daily. Takes 300 mg (1 capsule) in the morning and 600 mg (2 capsules) at night     HYDROcodone-acetaminophen 5-325 MG per tablet  Commonly known as:  NORCO/VICODIN  Take 1 tablet by mouth every 6 (six) hours as needed.     insulin aspart protamine- aspart (70-30) 100 UNIT/ML injection  Commonly known as:  NOVOLOG MIX 70/30  Inject 10 Units into the skin 2 (two) times daily with a meal. 10 units with breakfast, and 10 units with the evening meal     losartan 100 MG tablet  Commonly known as:  COZAAR  Take 1 tablet (100 mg total) by mouth daily.     NITROSTAT 0.4 MG SL tablet  Generic drug:  nitroGLYCERIN  Place 0.4 mg under the tongue every 5 (five) minutes as needed for chest  pain. For chest pain, max 3 doses     oxyCODONE-acetaminophen 5-325 MG per tablet  Commonly known as:  PERCOCET/ROXICET  Take 1-2 tablets every 4-6 hrs./ prn for severe pain     warfarin 5 MG tablet  Commonly known as:  COUMADIN  Take 2.5 mg by mouth See admin instructions. Takes 2.5 mg daily EXCEPT for none on Thursday        Disposition: CIR  Patient's condition: is Good  Follow up: 1. Dr. Hart Rochester in 4 weeks   Doreatha Massed, PA-C [facility] [phone] 02/09/2013  12:18 PM

## 2013-02-09 NOTE — Progress Notes (Signed)
Patient ID: [name], male   DOB: 1934-09-07, 78 y.o.   MRN: [id number] Vascular Surgery Progress Note  Subjective: 5 days post left AKA-awaiting rehabilitation decision  Objective:  Filed Vitals:   02/09/13 0604  BP: 137/58  Pulse: 73  Temp: 98.2 F (36.8 C)  Resp: 18    General alert oriented x3 in no apparent distress Abdomen slight distention no tenderness Left AKA looks good with no evidence infection or hematoma   Labs:  Recent Labs Lab 02/05/13 0353 02/06/13 0432 02/07/13 0514  CREATININE 1.67* 1.79* 1.90*    Recent Labs Lab 02/05/13 0353 02/06/13 0432 02/07/13 0514  NA 140 139 136  K 4.5 4.7 4.2  CL 106 106 102  CO2 23 21 23   BUN 29* 35* 39*  CREATININE 1.67* 1.79* 1.90*  GLUCOSE 119* 120* 110*  CALCIUM 8.7 8.5 8.3*    Recent Labs Lab 02/05/13 0353 02/06/13 0432  WBC 9.3 7.8  HGB 11.0* 10.7*  HCT 34.5* 33.9*  PLT 221 193    Recent Labs Lab 02/07/13 0514 02/08/13 0544 02/09/13 0456  INR 1.10 1.09 1.09    I/O last 3 completed shifts: In: 720 [P.O.:720] Out: 1950 [Urine:1950]  Imaging: No results found.  Assessment/Plan:  POD #5   LOS: 5 days  s/p Procedure(s): AMPUTATION ABOVE KNEE-LEFT  Await decision from insurance regarding CIR versus outpatient rehabilitation-2 no today Complaining of some constipation-we'll treat with milk of magnesia   [name], MD 02/09/2013 9:06 AM

## 2013-02-09 NOTE — Progress Notes (Signed)
Report given to [place], [place] on 4w.  Pt updated. Transferring pt to 4w24.

## 2013-02-09 NOTE — Progress Notes (Signed)
Rehab admissions - I have approval for acute inpatient rehab admission for today.  Bed available and can admit to inpatient rehab today.  Call me for questions.  #[phone] 

## 2013-02-09 NOTE — Progress Notes (Signed)
ANTICOAGULATION CONSULT NOTE - Initial Consult  Pharmacy Consult for Lovenox and Coumadin Indication: atrial fibrillation  Allergies  Allergen Reactions  . Other Other (See Comments)    Plastic tape tears skin    Patient Measurements: Height: 5\' 8"  (172.7 cm) Weight: 174 lb 13.2 oz (79.3 kg) IBW/kg (Calculated) : 68.4 Heparin Dosing Weight:   Vital Signs: Temp: 98.4 F (36.9 C) (12/09 1815) Temp src: Oral (12/09 1815) BP: 136/103 mmHg (12/09 1815) Pulse Rate: 75 (12/09 1815)  Labs:  Recent Labs  02/07/13 0514 02/08/13 0544 02/09/13 0456  LABPROT 14.0 13.9 13.9  INR 1.10 1.09 1.09  CREATININE 1.90*  --   --     Estimated Creatinine Clearance: 32 ml/min (by C-G formula based on Cr of 1.9).   Medical History: Past Medical History  Diagnosis Date  . Hyperlipidemia   . Gout   . Benign prostatic hypertrophy   . Atrial fibrillation     sees Dr. Verdis Prime  . Chronic systolic dysfunction of left ventricle     a. mixed ischemic and nonischemic CM,  EF 35%. b. s/p AICD implantation.  . CHF (congestive heart failure)   . ED (erectile dysfunction)   . Arthritis   . Pacemaker     medtronic  . CAD (coronary artery disease)     a. s/p mid LAD stenting with DES 2008 Dr. Verdis Prime  . ICD (implantable cardiac defibrillator) in place     medtronic, Dr. Johney Frame  . ICD (implantable cardiac defibrillator) in place     due for check in Feb/2013  . Sleep apnea     hx of "had surgery for"  . Hypertension     sees Dr. Claris Che  . Type II diabetes mellitus   . Automatic implantable cardioverter-defibrillator in situ   . Depression   . Numbness and tingling     Hx; 37f left foot  . PAD (peripheral artery disease)     Severe by PV angiogram 09/2011  . Renal artery stenosis     s/p stenting 2009  . PAD (peripheral artery disease)     s/p multiple LLE bypass grafts; left mid-distal SCA occlusion by 08/2012 duplex    Medications:  Scheduled:  . atorvastatin  10 mg Oral  q1800  . [START ON 02/10/2013] carvedilol  6.25 mg Oral BID WC  . cephALEXin  500 mg Oral TID  . coumadin book   Does not apply Once  . [START ON 02/10/2013] docusate sodium  100 mg Oral Daily  . enoxaparin (LOVENOX) injection  80 mg Subcutaneous Q12H  . [START ON 02/10/2013] finasteride  5 mg Oral Daily  . [START ON 02/10/2013] furosemide  40 mg Oral Daily  . [START ON 02/10/2013] gabapentin  300 mg Oral Daily  . gabapentin  600 mg Oral QHS  . [START ON 02/10/2013] insulin aspart protamine- aspart  7 Units Subcutaneous BID WC  . [START ON 02/10/2013] losartan  100 mg Oral Daily  . [START ON 02/10/2013] pantoprazole  40 mg Oral Daily  . polyethylene glycol  17 g Oral Daily  . warfarin  2.5 mg Oral Once  . [START ON 02/10/2013] warfarin   Does not apply Once  . 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 He has now been transferred to [facility]. Pt was on coumadin PTA for atrial fibrillation. His  home dose was 2.5 mg daily. Goal of Therapy:  INR 2-3     Plan:  For tonight. Ordered coumadin 2.5 mg in addition to the dose (2.5 mg given earlier). Continued the lovenox 30 mg the pt had been getting but this needs to be clarified on 12/10.  Eugene Garnet 02/09/2013,9:43 PM

## 2013-02-10 ENCOUNTER — Inpatient Hospital Stay (HOSPITAL_COMMUNITY): Payer: Medicare Other | Admitting: *Deleted

## 2013-02-10 ENCOUNTER — Encounter (HOSPITAL_COMMUNITY): Payer: Self-pay | Admitting: *Deleted

## 2013-02-10 ENCOUNTER — Inpatient Hospital Stay (HOSPITAL_COMMUNITY): Payer: Medicare Other | Admitting: Occupational Therapy

## 2013-02-10 DIAGNOSIS — D62 Acute posthemorrhagic anemia: Secondary | ICD-10-CM

## 2013-02-10 DIAGNOSIS — L98499 Non-pressure chronic ulcer of skin of other sites with unspecified severity: Secondary | ICD-10-CM

## 2013-02-10 DIAGNOSIS — N289 Disorder of kidney and ureter, unspecified: Secondary | ICD-10-CM

## 2013-02-10 DIAGNOSIS — S78119A Complete traumatic amputation at level between unspecified hip and knee, initial encounter: Secondary | ICD-10-CM

## 2013-02-10 DIAGNOSIS — I739 Peripheral vascular disease, unspecified: Secondary | ICD-10-CM

## 2013-02-10 DIAGNOSIS — I2510 Atherosclerotic heart disease of native coronary artery without angina pectoris: Secondary | ICD-10-CM

## 2013-02-10 DIAGNOSIS — N189 Chronic kidney disease, unspecified: Secondary | ICD-10-CM | POA: Diagnosis present

## 2013-02-10 DIAGNOSIS — E1165 Type 2 diabetes mellitus with hyperglycemia: Secondary | ICD-10-CM

## 2013-02-10 LAB — CBC WITH DIFFERENTIAL/PLATELET
Basophils Absolute: 0 10*3/uL (ref 0.0–0.1)
Basophils Relative: 0 % (ref 0–1)
Eosinophils Absolute: 0.3 10*3/uL (ref 0.0–0.7)
Eosinophils Relative: 4 % (ref 0–5)
Lymphocytes Relative: 27 % (ref 12–46)
MCHC: 31.8 g/dL (ref 30.0–36.0)
MCV: 82.3 fL (ref 78.0–100.0)
Monocytes Absolute: 0.8 10*3/uL (ref 0.1–1.0)
Platelets: 205 10*3/uL (ref 150–400)
RBC: 3.78 MIL/uL — ABNORMAL LOW (ref 4.22–5.81)
RDW: 14.9 % (ref 11.5–15.5)
WBC: 6.8 10*3/uL (ref 4.0–10.5)

## 2013-02-10 LAB — COMPREHENSIVE METABOLIC PANEL
ALT: 13 U/L (ref 0–53)
AST: 16 U/L (ref 0–37)
Albumin: 2.6 g/dL — ABNORMAL LOW (ref 3.5–5.2)
BUN: 43 mg/dL — ABNORMAL HIGH (ref 6–23)
CO2: 24 mEq/L (ref 19–32)
Calcium: 8.5 mg/dL (ref 8.4–10.5)
Creatinine, Ser: 1.81 mg/dL — ABNORMAL HIGH (ref 0.50–1.35)
Sodium: 138 mEq/L (ref 135–145)
Total Protein: 6.2 g/dL (ref 6.0–8.3)

## 2013-02-10 LAB — GLUCOSE, CAPILLARY
Glucose-Capillary: 112 mg/dL — ABNORMAL HIGH (ref 70–99)
Glucose-Capillary: 117 mg/dL — ABNORMAL HIGH (ref 70–99)
Glucose-Capillary: 131 mg/dL — ABNORMAL HIGH (ref 70–99)

## 2013-02-10 MED ORDER — ENOXAPARIN SODIUM 30 MG/0.3ML ~~LOC~~ SOLN
30.0000 mg | Freq: Two times a day (BID) | SUBCUTANEOUS | Status: DC
  Administered 2013-02-10 – 2013-02-14 (×8): 30 mg via SUBCUTANEOUS
  Filled 2013-02-10 (×12): qty 0.3

## 2013-02-10 MED ORDER — WARFARIN SODIUM 5 MG PO TABS
5.0000 mg | ORAL_TABLET | Freq: Once | ORAL | Status: AC
  Administered 2013-02-10: 5 mg via ORAL
  Filled 2013-02-10: qty 1

## 2013-02-10 MED ORDER — TRAMADOL HCL 50 MG PO TABS
50.0000 mg | ORAL_TABLET | Freq: Every day | ORAL | Status: DC
  Administered 2013-02-10 – 2013-02-14 (×5): 50 mg via ORAL
  Filled 2013-02-10 (×4): qty 1

## 2013-02-10 NOTE — Progress Notes (Signed)
Occupational Therapy Session Note  Patient Details  Name: FRANKLIN BAUMBACH MRN: [id number] Date of Birth: [birth year]  Today's Date: 02/10/2013 Time: 1345-1430 Time Calculation (min): 45 min  Short Term Goals: Week 1:  OT Short Term Goal 1 (Week 1): STG=LTG  Skilled Therapeutic Interventions/Progress Updates:    1:1 focus on UE strengthening with propelling w/c in hallways; simulated shower stall transfers hopping over threshold backwards with min A, adjustment of residual limb shrinker and problem solving how to transport items in the kitchen at w/c level.   Therapy Documentation Precautions:  Precautions Precautions: Fall Precaution Comments: pt having strong phantom sensation that his foot is still there Restrictions Weight Bearing Restrictions: No Pain: Pain Assessment Pain Assessment: 0-10 Pain Score: 8  Pain Type: Phantom pain Pain Location: Leg Pain Orientation: Left Pain Descriptors / Indicators: Aching;Nagging Pain Frequency: Constant Pain Onset: On-going Pain Intervention(s): Medication (See eMAR)  See FIM for current functional status  Therapy/Group: Individual Therapy  [name] [facility] 02/10/2013, 2:54 PM

## 2013-02-10 NOTE — Progress Notes (Signed)
RPH called RN and explained that she had put in an order for a 2.5mg  dose of Coumadin and 30mg  of Lovenox because patient's INR this morning was 1.09. Explained to patient and he agreed; medicines administered see MAR.

## 2013-02-10 NOTE — Progress Notes (Signed)
Physical Therapy Session Note  Patient Details  Name: [name] MRN: [id number] Date of Birth: 05/18/1934  Today's Date: 02/10/2013 Time: 1430-1500 Time Calculation (min): 30 min  Short Term Goals: Week 1:  PT Short Term Goal 1 (Week 1): STGs=LTGs secondary to ELOS  Skilled Therapeutic Interventions/Progress Updates:    Patient received supine in bed. Session focused on functional transfers, wheelchair parts education/management, and curb negotiation; see details below. Patient supine>sit from flat bed with bed rails and supervision, bed>wheelchair via squat pivot and supervision. Education provided about wheelchair parts management: locking/unlocking brakes, removal of leg rests, removal of armrests. Patient able to return demonstrate all wheelchair parts management with min verbal cues. Patient performed transfer wheelchair<>mat via squat pivot and supervision, performed set up and wheelchair management without verbal cues. Patient returned to room and transferred wheelchair>bed via squat pivot, sit>supine with supervision. Patient's son, brother, and friend present at end of session. Discussion with son about patient's ELOS, goals, and f/u therapy; son verbalized understanding and in agreement. Son reports doorways have been widened in patient's home to 26". Patient left supine in bed with all needs within reach and bed alarm on.  Therapy Documentation Precautions:  Precautions Precautions: Fall Precaution Comments: phantom pain Restrictions Weight Bearing Restrictions: No Pain: Pain Assessment Pain Assessment: 0-10 Pain Score: 7  Pain Type: Phantom pain Pain Location: Leg Pain Orientation: Left Pain Descriptors / Indicators: Aching;Sore Pain Frequency: Constant Pain Onset: Progressive Pain Intervention(s): Ambulation/increased activity;Repositioned Multiple Pain Sites: No Locomotion : Curb: 4: Min assist (with RW; ascends backwards, descends forwards) Research officer, political party: Yes Wheelchair Assistance: 5: Financial planner Details: Visual cues for safe use of DME/AE Wheelchair Propulsion: Both upper extremities Wheelchair Parts Management: Supervision/cueing Distance: 100   See FIM for current functional status  Therapy/Group: Individual Therapy  Chipper Herb. Trampas Stettner, PT, DPT 02/10/2013, 3:07 PM

## 2013-02-10 NOTE — Progress Notes (Signed)
ANTICOAGULATION CONSULT NOTE - Initial Consult  Pharmacy Consult for Coumadin Indication: atrial fibrillation  Allergies  Allergen Reactions  . Other Other (See Comments)    Plastic tape tears skin    Patient Measurements: Height: 5\' 8"  (172.7 cm) Weight: 174 lb 6.1 oz (79.1 kg) IBW/kg (Calculated) : 68.4    Vital Signs: Temp: 97.7 F (36.5 C) (12/10 1545) Temp src: Oral (12/10 1545) BP: 131/57 mmHg (12/10 1545) Pulse Rate: 73 (12/10 1545)  Labs:  Recent Labs  02/09/13 0456 02/09/13 2153 02/10/13 0605 02/10/13 1400  HGB  --   --  9.9*  --   HCT  --   --  31.1*  --   PLT  --   --  205  --   LABPROT 13.9 14.1  --  15.1  INR 1.09 1.11  --  1.22  CREATININE  --   --  1.81*  --     Estimated Creatinine Clearance: 33.6 ml/min (by C-G formula based on Cr of 1.81).   Medical History: Past Medical History  Diagnosis Date  . Hyperlipidemia   . Gout   . Benign prostatic hypertrophy   . Atrial fibrillation     sees Dr. Verdis Prime  . Chronic systolic dysfunction of left ventricle     a. mixed ischemic and nonischemic CM,  EF 35%. b. s/p AICD implantation.  . CHF (congestive heart failure)   . ED (erectile dysfunction)   . Arthritis   . Pacemaker     medtronic  . CAD (coronary artery disease)     a. s/p mid LAD stenting with DES 2008 Dr. Verdis Prime  . ICD (implantable cardiac defibrillator) in place     medtronic, Dr. Johney Frame  . ICD (implantable cardiac defibrillator) in place     due for check in Feb/2013  . Sleep apnea     hx of "had surgery for"  . Hypertension     sees Dr. Claris Che  . Type II diabetes mellitus   . Automatic implantable cardioverter-defibrillator in situ   . Depression   . Numbness and tingling     Hx; 54f left foot  . PAD (peripheral artery disease)     Severe by PV angiogram 09/2011  . Renal artery stenosis     s/p stenting 2009  . PAD (peripheral artery disease)     s/p multiple LLE bypass grafts; left mid-distal SCA occlusion by  08/2012 duplex    Medications:  Scheduled:  . atorvastatin  10 mg Oral q1800  . carvedilol  6.25 mg Oral BID WC  . cephALEXin  500 mg Oral TID  . docusate sodium  100 mg Oral Daily  . enoxaparin (LOVENOX) injection  30 mg Subcutaneous Q12H  . finasteride  5 mg Oral Daily  . furosemide  40 mg Oral Daily  . gabapentin  300 mg Oral Daily  . gabapentin  600 mg Oral QHS  . insulin aspart protamine- aspart  7 Units Subcutaneous BID WC  . losartan  100 mg Oral Daily  . pantoprazole  40 mg Oral Daily  . polyethylene glycol  17 g Oral Daily  . traMADol  50 mg Oral QHS  . warfarin   Does not apply Once  . 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 He was transferred to [facility] unit on 02/09/13. The patient was on coumadin  PTA for atrial fibrillation. His home dose was 2.5 mg daily. Today the INR is 1.22, remains subtherapeutic after being on 2.5 daily since 02/05/13. Coumadin increased to 5 mg yesterday. No bleeding noted. Hgb down to 9.9 and pltc stable at 205K.  Patien has h/o CKD. SCr =1.81, baseline SCR 2.0+.  I discussed lovenox dosing with Delle Reining, PA-C. She will order lovenox at dose of 30 mg SQ q12h to continue until INR >2.0.  Goal of Therapy:  INR 2-3    Plan:  Give Coumadin 5 mg po today x1 Daily PT/INR  Noah Delaine, RPh Clinical Pharmacist Pager: [phone] 02/10/2013,4:00 PM

## 2013-02-10 NOTE — Progress Notes (Signed)
Subjective/Complaints: Doing well except for emerging phantom limb pain last night.  A 12 point review of systems has been performed and if not noted above is otherwise negative.   Objective: Vital Signs: Blood pressure 142/59, pulse 70, temperature 98.9 F (37.2 C), temperature source Oral, resp. rate 18, height 5\' 8"  (1.727 m), weight 79.3 kg (174 lb 13.2 oz), SpO2 99.00%. No results found.  Recent Labs  02/10/13 0605  WBC 6.8  HGB 9.9*  HCT 31.1*  PLT 205    Recent Labs  02/10/13 0605  NA 138  K 4.4  CL 105  GLUCOSE 104*  BUN 43*  CREATININE 1.81*  CALCIUM 8.5   CBG (last 3)   Recent Labs  02/09/13 1627 02/09/13 2039 02/10/13 0739  GLUCAP 136* 180* 112*    Wt Readings from Last 3 Encounters:  02/09/13 79.3 kg (174 lb 13.2 oz)  02/06/13 80.3 kg (177 lb 0.5 oz)  02/06/13 80.3 kg (177 lb 0.5 oz)    Physical Exam:  Constitutional: He is oriented to person, place, and time. He appears well-developed and well-nourished.  HENT:  Head: Normocephalic and atraumatic.  Eyes: Conjunctivae are normal. Pupils are equal, round, and reactive to light.  Neck: Neck supple.  Cardiovascular: Normal rate and regular rhythm. No rubs, gallops  No murmur heard.  Respiratory: Effort normal and breath sounds normal. No respiratory distress. He has no wheezes.  GI: Soft. Bowel sounds are normal. He exhibits no distension. There is no tenderness.  Musculoskeletal:  L-AKA with staples intact--clean, dry, edematous but looks good. Stump sock just applied. Neurological: He is alert and oriented to person, place, and time.  Follows commands. Strength grossly intact in UE's at 5/5. RLE is 4/5 prox to 5/5 distally. Left HF 2+/5. mild sensory loss in distal right leg/foot.  Skin: Intact. RLE and BUE with old well-healed incisions.  Psychiatric: He has a normal mood and affect. His speech is normal and behavior is normal. Judgment and thought content normal. Cognition and memory are  normal.      Assessment/Plan: 1. Functional deficits secondary to left AKA which require 3+ hours per day of interdisciplinary therapy in a comprehensive inpatient rehab setting. Physiatrist is providing close team supervision and 24 hour management of active medical problems listed below. Physiatrist and rehab team continue to assess barriers to discharge/monitor patient progress toward functional and medical goals. FIM:                   Comprehension Comprehension Mode: Auditory Comprehension: 7-Follows complex conversation/direction: With no assist  Expression Expression Mode: Verbal Expression: 7-Expresses complex ideas: With no assist  Social Interaction Social Interaction: 7-Interacts appropriately with others - No medications needed.  Problem Solving Problem Solving: 7-Solves complex problems: Recognizes & self-corrects  Memory Memory: 7-Complete Independence: No helper Medical Problem List and Plan:  1. DVT Prophylaxis/Anticoagulation: Pharmaceutical: Coumadin resumed but still sub therapeutic--continue lovenox till INR > 2.0.  2. Pain Management: Continue neurontin for diabetic neuropathy. Add hs tramadol to help with phantom limb pain 3. H/o depression/ Mood: Seems a little anxious. Answered questions regarding healing and time frame of prosthesis. Will monitor for now and have LCSW follow for evaluation.  4. Neuropsych: This patient is capable of making decisions on his own behalf.  5. CAD with ICD/PPM: Monitor for any symptoms with increase in activity level. Continue coreg, lasix, cozaar and Lipitor.  6. DM type 2 with neuropathy: CM diet. Will decrease 70/30 insulin to avoid hypoglycemia--patient refused am  dose today. SSI as needed for elevated BS.sugars fairly controlled at present 7. A fib: Will monitor HR with bid checks. Continue coreg bid. Chronic coumadin resumed.  8. Chronic systolic CHF: Monitor weights qod. Low salt restrictions. Continue cozaar,  lasix, coreg and lipitor.  9. BPH: Continue proscar. Monitor for voiding problems post op.  10. ABLA: Will monitor for now. Recheck hgb 9.9 11. CKD?: baseline BUN/Cr--40/2.0+ range per records.--labs consistent with that  12. Wound: stump sock and education per prosthetist  LOS (Days) 1 A FACE TO FACE EVALUATION WAS PERFORMED  Keeli Roberg T 02/10/2013 8:25 AM

## 2013-02-10 NOTE — Evaluation (Signed)
Occupational Therapy Assessment and Plan  Patient Details  Name: [name] MRN: [id number] Date of Birth: 03-Apr-1934  OT Diagnosis: acute pain and muscle weakness (generalized) Rehab Potential: Rehab Potential: Good ELOS: 5-7 days   Today's Date: 02/10/2013 Time: 0730-0825 Time Calculation (min): 55 min  Problem List:  Patient Active Problem List   Diagnosis Date Noted  . Acute blood loss anemia 02/10/2013  . Chronic kidney disease 02/10/2013  . Unilateral AKA 02/09/2013  . Nontraumatic ischemic infarction of muscle of lower leg 02/04/2013  . Long term (current) use of anticoagulants 02/01/2013  . Pain, limb, left-Leg 01/21/2013  . Aftercare following surgery of the circulatory system, NEC 01/12/2013  . PVD (peripheral vascular disease) 12/22/2012  . Swelling of limb 10/07/2012  . Subclavian artery stenosis 09/08/2012  . Peripheral vascular disease, unspecified 08/18/2012  . Fever 07/08/2012  . Leg edema 06/02/2012  . Cardiomyopathy, ischemic 05/18/2012  . S/P ICD (internal cardiac defibrillator) procedure 05/18/2012  . Diabetes 05/18/2012  . Pain in limb 02/07/2012  . Atherosclerosis of native arteries of the extremities with intermittent claudication 12/17/2011  . Atherosclerotic PVD with intermittent claudication 10/15/2011  . Type I (juvenile type) diabetes mellitus with peripheral circulatory disorders, uncontrolled 09/06/2011  . PAD (peripheral artery disease) 09/04/2011  . CAD (coronary artery disease)   . Burning sensation of feet 05/10/2011  . Bursitis of right hip 05/10/2011  . Cervicalgia 05/10/2011  . Balance disorder 05/10/2011  . Neurogenic claudication 05/10/2011  . Lumbago 05/10/2011  . Chronic systolic congestive heart failure 07/06/2010  . ECZEMA 01/17/2010  . RENAL INSUFFICIENCY 11/01/2009  . UTI 11/01/2009  . CELLULITIS, LEG, RIGHT 08/22/2009  . LYMPHADENOPATHY, REACTIVE 08/22/2009  . PERIPHERAL NEUROPATHY 01/24/2009  . DIZZINESS  12/29/2008  . ABDOMINAL PAIN, GENERALIZED 08/22/2008  . GOUT 09/18/2007  . CORONARY ARTERY DISEASE 09/18/2007  . HYPERLIPIDEMIA 01/23/2007  . HYPERTENSION 01/23/2007  . Atrial fibrillation 01/23/2007  . BENIGN PROSTATIC HYPERTROPHY 01/23/2007  . LATERAL EPICONDYLITIS 01/23/2007    Past Medical History:  Past Medical History  Diagnosis Date  . Hyperlipidemia   . Gout   . Benign prostatic hypertrophy   . Atrial fibrillation     sees Dr. Verdis Prime  . Chronic systolic dysfunction of left ventricle     a. mixed ischemic and nonischemic CM,  EF 35%. b. s/p AICD implantation.  . CHF (congestive heart failure)   . ED (erectile dysfunction)   . Arthritis   . Pacemaker     medtronic  . CAD (coronary artery disease)     a. s/p mid LAD stenting with DES 2008 Dr. Verdis Prime  . ICD (implantable cardiac defibrillator) in place     medtronic, Dr. Johney Frame  . ICD (implantable cardiac defibrillator) in place     due for check in Feb/2013  . Sleep apnea     hx of "had surgery for"  . Hypertension     sees Dr. Claris Che  . Type II diabetes mellitus   . Automatic implantable cardioverter-defibrillator in situ   . Depression   . Numbness and tingling     Hx; 69f left foot  . PAD (peripheral artery disease)     Severe by PV angiogram 09/2011  . Renal artery stenosis     s/p stenting 2009  . PAD (peripheral artery disease)     s/p multiple LLE bypass grafts; left mid-distal SCA occlusion by 08/2012 duplex   Past Surgical History:  Past Surgical History  Procedure Laterality Date  .  Turp vaporization    . Cardiac defibrillator placement  12/26/09    pacemaker combo  . Cervical epidural injection  2013  . Femoral-tibial bypass graft  09/25/2011    Procedure: BYPASS GRAFT FEMORAL-TIBIAL ARTERY;  Surgeon: Pryor Ochoa, MD;  Location: [facility] OR;  Service: Vascular;  Laterality: Left;  Left Femoral - Anterior Tibial Bypass;  saphenous vein graft from left leg  . Intraoperative arteriogram   09/25/2011    Procedure: INTRA OPERATIVE ARTERIOGRAM;  Surgeon: Pryor Ochoa, MD;  Location: [facility] OR;  Service: Vascular;  Laterality: Left;  . Femoral-tibial bypass graft  02/07/2012    Procedure: BYPASS GRAFT FEMORAL-TIBIAL ARTERY;  Surgeon: Larina Earthly, MD;  Location: [facility] OR;  Service: Vascular;  Laterality: Left;  Thrombectomy Left Femoral - Tibial Bypass Graft  . Embolectomy  02/07/2012    Procedure: EMBOLECTOMY;  Surgeon: Larina Earthly, MD;  Location: [facility] OR;  Service: Vascular;  Laterality: Left;  . Femoral-tibial bypass graft  04/03/2012    Procedure: BYPASS GRAFT FEMORAL-TIBIAL ARTERY;  Surgeon: Pryor Ochoa, MD;  Location: [facility] OR;  Service: Vascular;  Laterality: Left;  Redo  . Insert / replace / remove pacemaker  2007  . Coronary angioplasty with stent placement  ~ 2000  . Coronary angioplasty    . Uvulopalatopharyngoplasty (uppp)/tonsillectomy/septoplasty  06/30/2003    [name] 06/30/2003 (07/10/2012)  . Shoulder open rotator cuff repair Right 2001    repair of lacerated right/notes 10/11/1999  (07/10/2012)  . Foot surgery Right 03/20/2001    "have plates and screws in; didn't break it" (07/10/2012)  . Carpal tunnel release Right 2002    [name] 03/20/2001 (07/10/2012)  . Biceps tendon repair Right 2001    [name] 03/20/2001 (07/10/2012)  . Renal artery stent  2009  . Femoral-popliteal bypass graft Left 09/16/2012    Procedure: LEFT FEMORAL-POPLITEAL BYPASS GRAFT WITH GORTEX Propaten Graft 6x80 Thin Wall and Left lower leg Angiogram;  Surgeon: Pryor Ochoa, MD;  Location: [facility] OR;  Service: Vascular;  Laterality: Left;  . Colonoscopy      Hx; of  . Tonsillectomy    . Adenoidectomy      Hx; of   . Femoral-tibial bypass graft Left 09/30/2012    Procedure: REDO LEFT FEMORAL-ANTERIOR TIBIAL ARTERY BYPASS USING COMPOSITE CEPHALIC AND BASILIC VEIN GRAFT FROM LEFT ARM;  Surgeon: Pryor Ochoa, MD;  Location: [facility] OR;  Service: Vascular;  Laterality: Left;  . I&d extremity Left 10/21/2012    Procedure:  EXPLORATION AND DEBRIDEMENT OF LEFT GROIN WOUND;  Surgeon: Pryor Ochoa, MD;  Location: [facility] OR;  Service: Vascular;  Laterality: Left;  . Patch angioplasty Left 10/21/2012    Procedure: PATCH ANGIOPLASTY;  Surgeon: Pryor Ochoa, MD;  Location: [facility] OR;  Service: Vascular;  Laterality: Left;  . Groin debridement Left 11/12/2012    Procedure: CLOSURE INGUINAL WOUND;  Surgeon: Pryor Ochoa, MD;  Location: [facility] OR;  Service: Vascular;  Laterality: Left;  . Embolectomy Left 12/16/2012    Procedure: THROMBECTOMY  LEFT LEG BYPASS;  Surgeon: Sherren Kerns, MD;  Location: [facility] OR;  Service: Vascular;  Laterality: Left;  . Leg amputation above knee Left 02/04/2013    DR LAWSON  . Amputation Left 02/04/2013    Procedure: AMPUTATION ABOVE KNEE-LEFT;  Surgeon: Pryor Ochoa, MD;  Location: [facility] OR;  Service: Vascular;  Laterality: Left;    Assessment & Plan Clinical Impression: Patient is a 78 y.o. year old male right-handed male CAF--chronic Coumadin, CAD w/ICM  and ICD/PPM, PVD with LLE ischemia and non healing wounds as well as DM type 2 with neuropathy. Patient with multiple attempts at revascularization and non reconstructable disease. He was admitted on 02/04/2013 for left AKA by Dr. Hart Rochester. Chronic Coumadin resumed post and Neurontin added for phantom pain. He is has had hypoglycemic episodes in the past 24 hours. Post op on keflex for wound prophylaxis--d/c today. Patient transferred to CIR on 02/09/2013 .     Patient currently requires min with basic self-care skills and basic mobility secondary to muscle weakness, decreased cardiorespiratoy endurance and decreased standing balance and decreased balance strategies.  Prior to hospitalization, patient could complete ADL with independent .  Patient will benefit from skilled intervention to decrease level of assist with basic self-care skills and increase independence with basic self-care skills prior to discharge home with care partner.  Anticipate patient  will require intermittent A with IADLs and follow up home health.  OT - End of Session Activity Tolerance: Tolerates 30+ min activity with multiple rests Endurance Deficit: Yes OT Assessment Rehab Potential: Good OT Patient demonstrates impairments in the following area(s): Balance;Edema;Endurance;Motor;Pain;Perception;Safety;Skin Integrity OT Basic ADL's Functional Problem(s): Bathing;Dressing;Toileting OT Advanced ADL's Functional Problem(s): Simple Meal Preparation OT Transfers Functional Problem(s): Toilet;Tub/Shower OT Additional Impairment(s): None OT Plan OT Intensity: Minimum of 1-2 x/day, 45 to 90 minutes OT Frequency: 5 out of 7 days OT Duration/Estimated Length of Stay: 5-7 days OT Treatment/Interventions: Balance/vestibular training;Cognitive remediation/compensation;Community reintegration;Discharge planning;DME/adaptive equipment instruction;Functional mobility training;Pain management;Psychosocial support;Skin care/wound managment;Self Care/advanced ADL retraining;Therapeutic Activities;Therapeutic Exercise;UE/LE Strength taining/ROM OT Self Feeding Anticipated Outcome(s): no goal OT Basic Self-Care Anticipated Outcome(s): mod I  OT Toileting Anticipated Outcome(s): mod I  OT Bathroom Transfers Anticipated Outcome(s): mod I  OT Recommendation Patient destination: Home Follow Up Recommendations: Home health OT Equipment Recommended: To be determined   Skilled Therapeutic Intervention 1:1 OT eval initiated with goals, role and purpose discussed with pt. Self care retraining including bed mobility with supervision, functional ambulation with RW from bed to bathroom, sit to stand, provided education on desensitization  Techniques, practicing donning and doffing shrinker, standing balance and d/c planning, importance of wearing a shoe for protection of right foot.   OT Evaluation Precautions/Restrictions  Precautions Precautions: Fall Precaution Comments: pt having strong  phantom sensation that his foot is still there Restrictions Weight Bearing Restrictions: No General Chart Reviewed: Yes Family/Caregiver Present: No Vital Signs Therapy Vitals Temp: 98.9 F (37.2 C) Temp src: Oral Pulse Rate: 70 Resp: 18 BP: 142/59 mmHg Patient Position, if appropriate: Lying Oxygen Therapy SpO2: 99 % O2 Device: None (Room air) Pain Pain Assessment Pain Assessment: 0-10 Pain Score: 4  Pain Type: Phantom pain Pain Location: Leg Pain Orientation: Left Pain Descriptors / Indicators: Nagging;Sore Pain Frequency: Constant Pain Onset: On-going Pain Intervention(s): Medication (See eMAR) Home Living/Prior Functioning Home Living Available Help at Discharge: Family;Available 24 hours/day Type of Home: House Home Access: Level entry Home Layout: One level Additional Comments: son states bathroom door only 24" wide, is planning to put in a wider door  Lives With: Significant other ADL  see FIM Vision/Perception  Vision - History Baseline Vision: Wears glasses only for reading Patient Visual Report: No change from baseline Vision - Assessment Eye Alignment: Within Functional Limits Perception Perception: Within Functional Limits Praxis Praxis: Intact  Cognition Overall Cognitive Status: Within Functional Limits for tasks assessed Orientation Level: Oriented X4 Safety/Judgment: Appears intact Sensation Sensation Light Touch: Appears Intact Proprioception: Appears Intact Coordination Gross Motor Movements are Fluid and Coordinated: Yes  Fine Motor Movements are Fluid and Coordinated: Yes Motor  Motor Motor: Within Functional Limits Motor - Skilled Clinical Observations: generalized weakness and decr balance Mobility  Bed Mobility Supine to Sit: 5: Supervision Sit to Supine: 5: Supervision Transfers Transfers: Sit to Stand;Stand to Sit Sit to Stand: 5: Supervision Stand to Sit: 5: Supervision  Trunk/Postural Assessment  Cervical  Assessment Cervical Assessment: Within Functional Limits Thoracic Assessment Thoracic Assessment: Within Functional Limits Lumbar Assessment Lumbar Assessment: Within Functional Limits Postural Control Postural Control: Within Functional Limits  Balance Balance Balance Assessed: Yes Static Sitting Balance Static Sitting - Level of Assistance: 6: Modified independent (Device/Increase time) Static Standing Balance Static Standing - Level of Assistance: 5: Stand by assistance Dynamic Standing Balance Dynamic Standing - Level of Assistance: 4: Min assist Extremity/Trunk Assessment RUE Assessment RUE Assessment: Within Functional Limits LUE Assessment LUE Assessment: Within Functional Limits  FIM:  FIM - Bathing Bathing Steps Patient Completed: Chest;Right Arm;Left Arm;Abdomen;Right upper leg;Left upper leg;Left lower leg (including foot) Bathing: 3: Mod-Patient completes 5-7 27f 10 parts or 50-74% FIM - Upper Body Dressing/Undressing Upper body dressing/undressing steps patient completed: Thread/unthread right sleeve of front closure shirt/dress;Thread/unthread left sleeve of front closure shirt/dress;Pull shirt around back of front closure shirt/dress;Button/unbutton shirt FIM - Lower Body Dressing/Undressing Lower body dressing/undressing steps patient completed: Thread/unthread right pants leg;Thread/unthread left pants leg;Pull pants up/down;Don/Doff right shoe Lower body dressing/undressing: 4: Min-Patient completed 75 plus % of tasks FIM - Toileting Toileting steps completed by patient: Adjust clothing prior to toileting;Performs perineal hygiene;Adjust clothing after toileting Toileting: 4: Steadying assist FIM - Bed/Chair Transfer Bed/Chair Transfer: 5: Supine > Sit: Supervision (verbal cues/safety issues) FIM - Toilet Transfers Toilet Transfers: 5-To toilet/BSC: Supervision (verbal cues/safety issues);5-From toilet/BSC: Supervision (verbal cues/safety issues)   Refer to  Care Plan for Long Term Goals  Recommendations for other services: None  Discharge Criteria: Patient will be discharged from OT if patient refuses treatment 3 consecutive times without medical reason, if treatment goals not met, if there is a change in medical status, if patient makes no progress towards goals or if patient is discharged from hospital.  The above assessment, treatment plan, treatment alternatives and goals were discussed and mutually agreed upon: by patient  Adan Sis 02/10/2013, 8:49 AM

## 2013-02-10 NOTE — Evaluation (Signed)
Physical Therapy Assessment and Plan  Patient Details  Name: [name] MRN: [id number] Date of Birth: [birth year]  PT Diagnosis: Difficulty walking, Muscle weakness and Pain in residual limb Rehab Potential: Good ELOS: 7-8 days   Today's Date: 02/10/2013 Time: 4098-1191 Time Calculation (min): 62 min  Problem List:  Patient Active Problem List   Diagnosis Date Noted  . Acute blood loss anemia 02/10/2013  . Chronic kidney disease 02/10/2013  . Unilateral AKA 02/09/2013  . Nontraumatic ischemic infarction of muscle of lower leg 02/04/2013  . Long term (current) use of anticoagulants 02/01/2013  . Pain, limb, left-Leg 01/21/2013  . Aftercare following surgery of the circulatory system, NEC 01/12/2013  . PVD (peripheral vascular disease) 12/22/2012  . Swelling of limb 10/07/2012  . Subclavian artery stenosis 09/08/2012  . Peripheral vascular disease, unspecified 08/18/2012  . Fever 07/08/2012  . Leg edema 06/02/2012  . Cardiomyopathy, ischemic 05/18/2012  . S/P ICD (internal cardiac defibrillator) procedure 05/18/2012  . Diabetes 05/18/2012  . Pain in limb 02/07/2012  . Atherosclerosis of native arteries of the extremities with intermittent claudication 12/17/2011  . Atherosclerotic PVD with intermittent claudication 10/15/2011  . Type I (juvenile type) diabetes mellitus with peripheral circulatory disorders, uncontrolled 09/06/2011  . PAD (peripheral artery disease) 09/04/2011  . CAD (coronary artery disease)   . Burning sensation of feet 05/10/2011  . Bursitis of right hip 05/10/2011  . Cervicalgia 05/10/2011  . Balance disorder 05/10/2011  . Neurogenic claudication 05/10/2011  . Lumbago 05/10/2011  . Chronic systolic congestive heart failure 07/06/2010  . ECZEMA 01/17/2010  . RENAL INSUFFICIENCY 11/01/2009  . UTI 11/01/2009  . CELLULITIS, LEG, RIGHT 08/22/2009  . LYMPHADENOPATHY, REACTIVE 08/22/2009  . PERIPHERAL NEUROPATHY 01/24/2009  . DIZZINESS 12/29/2008   . ABDOMINAL PAIN, GENERALIZED 08/22/2008  . GOUT 09/18/2007  . CORONARY ARTERY DISEASE 09/18/2007  . HYPERLIPIDEMIA 01/23/2007  . HYPERTENSION 01/23/2007  . Atrial fibrillation 01/23/2007  . BENIGN PROSTATIC HYPERTROPHY 01/23/2007  . LATERAL EPICONDYLITIS 01/23/2007    Past Medical History:  Past Medical History  Diagnosis Date  . Hyperlipidemia   . Gout   . Benign prostatic hypertrophy   . Atrial fibrillation     sees Dr. Verdis Prime  . Chronic systolic dysfunction of left ventricle     a. mixed ischemic and nonischemic CM,  EF 35%. b. s/p AICD implantation.  . CHF (congestive heart failure)   . ED (erectile dysfunction)   . Arthritis   . Pacemaker     medtronic  . CAD (coronary artery disease)     a. s/p mid LAD stenting with DES 2008 Dr. Verdis Prime  . ICD (implantable cardiac defibrillator) in place     medtronic, Dr. Johney Frame  . ICD (implantable cardiac defibrillator) in place     due for check in Feb/2013  . Sleep apnea     hx of "had surgery for"  . Hypertension     sees Dr. Claris Che  . Type II diabetes mellitus   . Automatic implantable cardioverter-defibrillator in situ   . Depression   . Numbness and tingling     Hx; 78f left foot  . PAD (peripheral artery disease)     Severe by PV angiogram 09/2011  . Renal artery stenosis     s/p stenting 2009  . PAD (peripheral artery disease)     s/p multiple LLE bypass grafts; left mid-distal SCA occlusion by 08/2012 duplex   Past Surgical History:  Past Surgical History  Procedure Laterality Date  .  Turp vaporization    . Cardiac defibrillator placement  12/26/09    pacemaker combo  . Cervical epidural injection  2013  . Femoral-tibial bypass graft  09/25/2011    Procedure: BYPASS GRAFT FEMORAL-TIBIAL ARTERY;  Surgeon: Pryor Ochoa, MD;  Location: [facility] OR;  Service: Vascular;  Laterality: Left;  Left Femoral - Anterior Tibial Bypass;  saphenous vein graft from left leg  . Intraoperative arteriogram  09/25/2011     Procedure: INTRA OPERATIVE ARTERIOGRAM;  Surgeon: Pryor Ochoa, MD;  Location: [facility] OR;  Service: Vascular;  Laterality: Left;  . Femoral-tibial bypass graft  02/07/2012    Procedure: BYPASS GRAFT FEMORAL-TIBIAL ARTERY;  Surgeon: Larina Earthly, MD;  Location: [facility] OR;  Service: Vascular;  Laterality: Left;  Thrombectomy Left Femoral - Tibial Bypass Graft  . Embolectomy  02/07/2012    Procedure: EMBOLECTOMY;  Surgeon: Larina Earthly, MD;  Location: [facility] OR;  Service: Vascular;  Laterality: Left;  . Femoral-tibial bypass graft  04/03/2012    Procedure: BYPASS GRAFT FEMORAL-TIBIAL ARTERY;  Surgeon: Pryor Ochoa, MD;  Location: [facility] OR;  Service: Vascular;  Laterality: Left;  Redo  . Insert / replace / remove pacemaker  2007  . Coronary angioplasty with stent placement  ~ 2000  . Coronary angioplasty    . Uvulopalatopharyngoplasty (uppp)/tonsillectomy/septoplasty  06/30/2003    [name] 06/30/2003 (07/10/2012)  . Shoulder open rotator cuff repair Right 2001    repair of lacerated right/notes 10/11/1999  (07/10/2012)  . Foot surgery Right 03/20/2001    "have plates and screws in; didn't break it" (07/10/2012)  . Carpal tunnel release Right 2002    [name] 03/20/2001 (07/10/2012)  . Biceps tendon repair Right 2001    [name] 03/20/2001 (07/10/2012)  . Renal artery stent  2009  . Femoral-popliteal bypass graft Left 09/16/2012    Procedure: LEFT FEMORAL-POPLITEAL BYPASS GRAFT WITH GORTEX Propaten Graft 6x80 Thin Wall and Left lower leg Angiogram;  Surgeon: Pryor Ochoa, MD;  Location: [facility] OR;  Service: Vascular;  Laterality: Left;  . Colonoscopy      Hx; of  . Tonsillectomy    . Adenoidectomy      Hx; of   . Femoral-tibial bypass graft Left 09/30/2012    Procedure: REDO LEFT FEMORAL-ANTERIOR TIBIAL ARTERY BYPASS USING COMPOSITE CEPHALIC AND BASILIC VEIN GRAFT FROM LEFT ARM;  Surgeon: Pryor Ochoa, MD;  Location: [facility] OR;  Service: Vascular;  Laterality: Left;  . I&d extremity Left 10/21/2012    Procedure: EXPLORATION AND  DEBRIDEMENT OF LEFT GROIN WOUND;  Surgeon: Pryor Ochoa, MD;  Location: [facility] OR;  Service: Vascular;  Laterality: Left;  . Patch angioplasty Left 10/21/2012    Procedure: PATCH ANGIOPLASTY;  Surgeon: Pryor Ochoa, MD;  Location: [facility] OR;  Service: Vascular;  Laterality: Left;  . Groin debridement Left 11/12/2012    Procedure: CLOSURE INGUINAL WOUND;  Surgeon: Pryor Ochoa, MD;  Location: [facility] OR;  Service: Vascular;  Laterality: Left;  . Embolectomy Left 12/16/2012    Procedure: THROMBECTOMY  LEFT LEG BYPASS;  Surgeon: Sherren Kerns, MD;  Location: [facility] OR;  Service: Vascular;  Laterality: Left;  . Leg amputation above knee Left 02/04/2013    DR LAWSON  . Amputation Left 02/04/2013    Procedure: AMPUTATION ABOVE KNEE-LEFT;  Surgeon: Pryor Ochoa, MD;  Location: [facility] OR;  Service: Vascular;  Laterality: Left;    Assessment & Plan Clinical Impression:  BRAELIN COSTLOW is a 78 y.o. right-handed male CAF--chronic Coumadin, CAD w/ICM  and ICD/PPM, PVD with LLE ischemia and non healing wounds as well as DM type 2 with neuropathy. Patient with multiple attempts at revascularization and non reconstructable disease. He was admitted on 02/04/2013 for left AKA by Dr. Hart Rochester. Chronic Coumadin resumed post and Neurontin added for phantom pain. He is has had hypoglycemic episodes in the past 24 hours. Post op on keflex for wound prophylaxis--d/c today. Therapies ongoing and CIR recommended by therapy and MD. Patient transferred to CIR on 02/09/2013 .   Patient currently requires min with mobility secondary to muscle weakness, decreased cardiorespiratoy endurance and decreased standing balance and decreased balance strategies.  Prior to hospitalization, patient was independent  with mobility and lived with Alone;Son (lives alone in [place], will d/c to [place] with son) in a House home.  Home access is  Level entry.  Patient will benefit from skilled PT intervention to maximize safe functional mobility, minimize fall risk and  decrease caregiver burden for planned discharge home with intermittent assist.  Anticipate patient will benefit from follow up OP at discharge.  PT - End of Session Activity Tolerance: Tolerates 30+ min activity with multiple rests Endurance Deficit: Yes Endurance Deficit Description: fatigues quickly and requires rest breaks PT Assessment Rehab Potential: Good Barriers to Discharge: Decreased caregiver support Barriers to Discharge Comments: Patient plans to discharge home for 1-2 days, then travel to [place] to stay with son through the holidays; return to [place] after the holidays PT Patient demonstrates impairments in the following area(s): Balance;Endurance;Motor;Pain;Safety PT Transfers Functional Problem(s): Bed Mobility;Bed to Chair;Car;Furniture;Floor PT Locomotion Functional Problem(s): Ambulation;Wheelchair Mobility;Stairs PT Plan PT Intensity: Minimum of 1-2 x/day ,45 to 90 minutes PT Frequency: 5 out of 7 days PT Duration Estimated Length of Stay: 7-8 days PT Treatment/Interventions: Ambulation/gait training;Balance/vestibular training;Community reintegration;Discharge planning;Neuromuscular re-education;Functional mobility training;DME/adaptive equipment instruction;Pain management;Patient/family education;Psychosocial support;Skin care/wound management;UE/LE Coordination activities;Splinting/orthotics;UE/LE Strength taining/ROM;Therapeutic Exercise;Therapeutic Activities;Wheelchair propulsion/positioning;Stair training PT Transfers Anticipated Outcome(s): mod I PT Locomotion Anticipated Outcome(s): mod I w/c mobility and household ambulation, S stairs PT Recommendation Recommendations for Other Services: Neuropsych consult Follow Up Recommendations: Home health PT Patient destination: Home Equipment Recommended: Wheelchair (measurements);Wheelchair cushion (measurements);Rolling walker with 5" wheels Equipment Details: Patient owns RW and motorized scooter; Recommendations TBD upon  discharge  Skilled Therapeutic Intervention Skilled therapeutic intervention initiated after completion of evaluation. Patient's brother and sister in law (from [place]) present at end of session. Discussed falls risk, safety within room, focus of therapy during stay. Discussed possible LOS, goals, and f/u therapy. Discusion with patient and family about discharge plan. Patient lives here in [place] and will discharge to his home for 1-2 days before traveling to [place] for the holidays. Undecided where he will stay in [place]-either with son, sister, or brother. All of these places have several STE homes without handrails. Discussion with patient and family about several options for home access when staying with family: bumping up stairs in wheelchair, having temporary ramp installed, having handrails installed, bumping up on bottom. Patient states he is unsure where he will be staying and we can make a decision about plan once he knows this information. Patient left seated in wheelchair with all needs within reach and family present.  PT Evaluation Precautions/Restrictions Precautions Precautions: Fall Precaution Comments: pt having strong phantom sensation that his foot is still there Restrictions Weight Bearing Restrictions: No General Chart Reviewed: Yes Family/Caregiver Present: No Vital Signs  Pain Pain Assessment Pain Assessment: No/denies pain Pain Score: 0-No pain Home Living/Prior Functioning Home Living Available Help at Discharge: Family;Available 24  hours/day Type of Home: House Home Access: Level entry Home Layout: One level Additional Comments: son states bathroom door only 24" wide, is planning to put in a wider door  Lives With: Alone;Son (lives alone in [place], will d/c to [place] with son) Prior Function Level of Independence: Independent with basic ADLs;Independent with transfers;Independent with gait  Able to Take Stairs?: Yes Driving: Yes Vocation: Part time employment Comments: used  cane PTA; works Database administrator - History Baseline Vision: Wears glasses only for reading Patient Visual Report: No change from baseline Vision - Assessment Eye Alignment: Within Functional Limits Perception Perception: Within Functional Limits Praxis Praxis: Intact  Cognition Overall Cognitive Status: Within Functional Limits for tasks assessed Orientation Level: Oriented X4 Memory: Appears intact Awareness: Appears intact Problem Solving: Appears intact Safety/Judgment: Appears intact Sensation Sensation Light Touch: Appears Intact Proprioception: Appears Intact Coordination Gross Motor Movements are Fluid and Coordinated: Yes Fine Motor Movements are Fluid and Coordinated: Yes Motor  Motor Motor: Within Functional Limits Motor - Skilled Clinical Observations: generalized weakness and decr balance  Mobility Bed Mobility Bed Mobility: Supine to Sit Supine to Sit: With rails;5: Supervision;HOB flat Supine to Sit Details: Verbal cues for precautions/safety Sitting - Scoot to Edge of Bed: 5: Supervision Sitting - Scoot to [place] of Bed Details: Verbal cues for precautions/safety Sit to Supine: 5: Supervision Transfers Sit to Stand: 4: Min assist;With armrests;From bed;From chair/3-in-1;With upper extremity assist;From toilet;5: Supervision (requires minA initially secondary to reaching for RW prematurely) Sit to Stand Details: Verbal cues for precautions/safety;Verbal cues for sequencing;Verbal cues for technique Stand to Sit: 5: Supervision;To chair/3-in-1;To toilet;With armrests;With upper extremity assist Stand to Sit Details (indicate cue type and reason): Verbal cues for precautions/safety;Verbal cues for sequencing;Verbal cues for technique Stand Pivot Transfers: 4: Min guard;With armrests Squat Pivot Transfers: 5: Supervision;With upper extremity assistance;With armrests Squat Pivot Transfer Details: Verbal cues for sequencing;Verbal cues  for technique;Verbal cues for precautions/safety Locomotion  Ambulation Ambulation: Yes Ambulation/Gait Assistance: 4: Min assist Ambulation Distance (Feet): 23 Feet Assistive device: Rolling walker Ambulation/Gait Assistance Details: Visual cues for safe use of DME/AE;Visual cues/gestures for sequencing;Verbal cues for precautions/safety Ambulation/Gait Assistance Details: Patient performed gait training 23' x1 in controlled environment. Visual demo provided for proper sequencing and technique and that patient is to use B UEs to lift himself and not hop forward. Gait Gait: Yes Gait Pattern: Impaired Gait Pattern: Decreased stride length;Step-to pattern Stairs / Additional Locomotion Stairs: Yes Stairs Assistance: 4: Min assist Stairs Assistance Details: Visual cues for safe use of DME/AE;Visual cues/gestures for sequencing;Verbal cues for sequencing;Verbal cues for technique;Verbal cues for precautions/safety Stairs Assistance Details (indicate cue type and reason): Ascends forwards, descends backwards Stair Management Technique: Two rails;Step to pattern;Forwards;Backwards Number of Stairs: 3 Height of Stairs: 5 Corporate treasurer: Yes Wheelchair Assistance: 5: Financial planner Details: Verbal cues for sequencing;Verbal cues for Diplomatic Services operational officer: Both upper extremities Wheelchair Parts Management: Needs assistance Distance: 100  Trunk/Postural Assessment  Cervical Assessment Cervical Assessment: Within Functional Limits Thoracic Assessment Thoracic Assessment: Within Functional Limits Lumbar Assessment Lumbar Assessment: Within Functional Limits Postural Control Postural Control: Within Functional Limits  Balance Balance Balance Assessed: Yes Static Sitting Balance Static Sitting - Balance Support: No upper extremity supported;Feet supported (R LE supported) Static Sitting - Level of Assistance: 6: Modified independent  (Device/Increase time) Static Standing Balance Static Standing - Balance Support: Bilateral upper extremity supported Static Standing - Level of Assistance: 5: Stand by assistance Dynamic Standing Balance Dynamic Standing - Balance  Support: Right upper extremity supported;During functional activity Dynamic Standing - Level of Assistance: 4: Min assist Dynamic Standing - Comments: Min A for dynamic standing balance in bathroom; patient with R UE on grab bar for support, able to use L UE to pull up pants Extremity Assessment  RUE Assessment RUE Assessment: Within Functional Limits LUE Assessment LUE Assessment: Within Functional Limits RLE Assessment RLE Assessment: Within Functional Limits (Grossly 5/5) LLE Assessment LLE Assessment: Exceptions to [facility] LLE Strength LLE Overall Strength: Deficits;Due to pain LLE Overall Strength Comments: Formal MMT not performed secondary to pain. Patient with grossly 3/5 strength for hip flexion, abd, add.  FIM:  FIM - Banker Devices: Bed rails;Arm rests;Walker Bed/Chair Transfer: 5: Supine > Sit: Supervision (verbal cues/safety issues);4: Bed > Chair or W/C: Min A (steadying Pt. > 75%);4: Chair or W/C > Bed: Min A (steadying Pt. > 75%) FIM - Locomotion: Wheelchair Distance: 100 Locomotion: Wheelchair: 2: Travels 50 - 149 ft with supervision, cueing or coaxing FIM - Locomotion: Ambulation Locomotion: Ambulation Assistive Devices: Designer, industrial/product Ambulation/Gait Assistance: 4: Min assist Locomotion: Ambulation: 1: Travels less than 50 ft with minimal assistance (Pt.>75%) FIM - Locomotion: Stairs Locomotion: Building control surveyor: Hand rail - 2 Locomotion: Stairs: 1: Up and Down < 4 stairs with minimal assistance (Pt.>75%)   Refer to Care Plan for Long Term Goals  Recommendations for other services: Neuropsych  Discharge Criteria: Patient will be discharged from PT if patient refuses treatment 3  consecutive times without medical reason, if treatment goals not met, if there is a change in medical status, if patient makes no progress towards goals or if patient is discharged from hospital.  The above assessment, treatment plan, treatment alternatives and goals were discussed and mutually agreed upon: by patient and by family  Chipper Herb. Ayline Dingus, PT, DPT 02/10/2013, 11:56 AM

## 2013-02-10 NOTE — Progress Notes (Signed)
Patient information reviewed and entered into eRehab system by Merelyn Klump, RN, CRRN, PPS Coordinator.  Information including medical coding and functional independence measure will be reviewed and updated through discharge.     Per nursing patient was given "Data Collection Information Summary for Patients in Inpatient Rehabilitation Facilities with attached "Privacy Act Statement-Health Care Records" upon admission.  

## 2013-02-11 ENCOUNTER — Inpatient Hospital Stay (HOSPITAL_COMMUNITY): Payer: Medicare Other | Admitting: *Deleted

## 2013-02-11 ENCOUNTER — Encounter (HOSPITAL_COMMUNITY): Payer: Medicare Other | Admitting: Occupational Therapy

## 2013-02-11 LAB — GLUCOSE, CAPILLARY
Glucose-Capillary: 104 mg/dL — ABNORMAL HIGH (ref 70–99)
Glucose-Capillary: 128 mg/dL — ABNORMAL HIGH (ref 70–99)

## 2013-02-11 LAB — PROTIME-INR
INR: 1.41 (ref 0.00–1.49)
Prothrombin Time: 16.9 seconds — ABNORMAL HIGH (ref 11.6–15.2)

## 2013-02-11 MED ORDER — GABAPENTIN 600 MG PO TABS
600.0000 mg | ORAL_TABLET | Freq: Three times a day (TID) | ORAL | Status: DC
  Administered 2013-02-11 – 2013-02-12 (×5): 600 mg via ORAL
  Filled 2013-02-11 (×9): qty 1

## 2013-02-11 MED ORDER — WARFARIN SODIUM 5 MG PO TABS
5.0000 mg | ORAL_TABLET | Freq: Once | ORAL | Status: AC
  Administered 2013-02-11: 5 mg via ORAL
  Filled 2013-02-11: qty 1

## 2013-02-11 NOTE — Progress Notes (Signed)
Physical Therapy Session Note  Patient Details  Name: SEGUNDO MAKELA MRN: [id number] Date of Birth: [birth year]  Today's Date: 02/11/2013 Time: 0930-1030,  1130-1200, and 1305-1400 Time Calculation (min): 60 min, 30 min, and 55 min  Short Term Goals: Week 1:  PT Short Term Goal 1 (Week 1): STGs=LTGs secondary to ELOS  Skilled Therapeutic Interventions/Progress Updates:    First Session: Patient received supine in bed. Session focused on functional transfers, gait training, and LE exercises; see details below. Patient with requests to use bathroom. Patient performed squat pivot transfer bed>wheelchair, propelled wheelchair to bathroom and wheelchair<>toilet with mod I. Patient able to manage all aspects of toileting without LOB and with proper safety and mod I. Patient made mod I from wheelchair level in controlled environment of room. Patient with c/o phantom limb pain today. Continued education about desensitization techniques/strategies to decrease pain and provided patient with handout with this information. See details below for gait training.  Patient provided with HEP handouts, reviewed and patient return demonstrated half of exercises (will finish during PM session). Patient performed supine glute sets with 5" hold, L hip extension press into towel with 5" hold, B SLR, R LE bridges, B hamstring stretch with towel (2x30"), all exercises 2x10.  Discussion about f/u therapy being set up in outpatient venue upon return to [place] after time in [place] for the holidays. Discussed patient will be provided with HEP to perform to maintain flexibility and strength during the holidays and time until he receives follow up therapy. Patient verbalized understanding and in agreement.  Patient made mod I from wheelchair level in controlled environment of room. Patient left sitting in wheelchair with all needs within reach.  Second session: Patient received supine in bed, brother and sister in law present as well  as Child psychotherapist. Discussion about DME needs, f/u therapy, prosthesis process, etc. Patient and patient's brother expressing interest in use of crutches. Educated about use of crutches vs. RW and energy conservation and balance related to the two pieces of equipment. Agreed to have patient attempt transfers and gait with crutches during this session. Patient negotiated ramp in wheelchair, see details below. Crutches sized appropriately for patient, visual demonstration provided for proper sequencing/technique for sit>stand prior to gait training. Patient attempted to stand from wheelchair with use of crutches x3 trials and unable to do so. Patient states that he is "too tired." Will attempt tomorrow if patient still believes crutches are safe/functional. Patient returned to room and left supine in bed with all needs within reach.  Third session: Patient received supine in bed. Session focused on functional transfers, increasing endurance with wheelchair mobility, and UE/LE strengthening/remainder of HEP. Patient performed sidelying hip abd x20, B hamstring stretches with towel (2x30"), prone press up for prolonged hip flexors stretch attempted however patient unable to tolerate secondary to elbow pain. Instead, patient performed hip flexor stretch on each side with LE off edge of raised mat 2 x30" each side. See details below for standing balance. Patient propelled back towards room 150' with supervision, transferred wheelchair>bed with mod I, left supine in bed with all needs within reach.  Therapy Documentation Precautions:  Precautions Precautions: Fall Precaution Comments: phantom pain Restrictions Weight Bearing Restrictions: No Pain: Pain Assessment Pain Assessment: 0-10 Pain Score: 5  Pain Type: Phantom pain Pain Location: Leg Pain Orientation: Left Pain Descriptors / Indicators: Aching;Stabbing Pain Onset: On-going Pain Intervention(s): Repositioned;Ambulation/increased activity Multiple  Pain Sites: No Locomotion : Ambulation Ambulation: Yes Ambulation/Gait Assistance: 4: Min assist Ambulation Distance (  Feet): 60 Feet Assistive device: Rolling walker Ambulation/Gait Assistance Details: Visual cues for safe use of DME/AE;Visual cues/gestures for sequencing;Verbal cues for precautions/safety Ambulation/Gait Assistance Details: Patient performed gait training 60' x1 in controlled environment. Verbal cues for pushing RW and maintaining on floor rather than picking it up to advance it. Verbal cues for pacing with increased fatigue. Gait Gait: Yes Gait Pattern: Impaired Gait Pattern: Decreased stride length;Step-to pattern Stairs / Additional Locomotion Ramp: 5: Supervision (ascends backwards, descends forwards) Naval architect Mobility: Yes Wheelchair Assistance: 5: Financial planner Details: Verbal cues for Engineer, drilling: Both upper extremities Wheelchair Parts Management: Supervision/cueing Distance: 175 x2, 150' x1 Balance: Balance Balance Assessed: Yes Dynamic Standing Balance Dynamic Standing - Balance Support: Right upper extremity supported;Left upper extremity supported Dynamic Standing - Level of Assistance: 4: Min assist Dynamic Standing - Comments: Standing with elevated table to play ConnectFour game, one UE supported at a time while other reaching for pieces; activity lasts 3 min  See FIM for current functional status  Therapy/Group: Individual Therapy  Chipper Herb. Dhanvi Boesen, PT, DPT 02/11/2013, 2:10 PM

## 2013-02-11 NOTE — IPOC Note (Signed)
Overall Plan of Care [facility]) Patient Details Name: [name] MRN: [id number] DOB: February 09, 1935  Admitting Diagnosis: L AKA  Hospital Problems: Principal Problem:   Unilateral AKA Active Problems:   PERIPHERAL NEUROPATHY   HYPERTENSION   CORONARY ARTERY DISEASE   Atrial fibrillation   Diabetes   Acute blood loss anemia   Chronic kidney disease     Functional Problem List: Nursing Endurance;Motor;Pain;Skin Integrity;Sensory;Medication Management  PT Balance;Endurance;Motor;Pain;Safety  OT Balance;Edema;Endurance;Motor;Pain;Perception;Safety;Skin Integrity  SLP    TR         Basic ADL's: OT Bathing;Dressing;Toileting     Advanced  ADL's: OT Simple Meal Preparation     Transfers: PT Bed Mobility;Bed to Chair;Car;Furniture;Floor  OT Toilet;Tub/Shower     Locomotion: PT Ambulation;Wheelchair Mobility;Stairs     Additional Impairments: OT None  SLP        TR      Anticipated Outcomes Item Anticipated Outcome  Self Feeding no goal  Swallowing      Basic self-care  mod I   Toileting  mod I    Bathroom Transfers mod I   Bowel/Bladder  cont of bowel and bladder with mod independence  Transfers  mod I  Locomotion  mod I w/c mobility and household ambulation, S stairs  Communication     Cognition     Pain  pain less than 3/10  Safety/Judgment  patient will be safe with mod independence while in rehab   Therapy Plan: PT Intensity: Minimum of 1-2 x/day ,45 to 90 minutes PT Frequency: 5 out of 7 days PT Duration Estimated Length of Stay: 7-8 days OT Intensity: Minimum of 1-2 x/day, 45 to 90 minutes OT Frequency: 5 out of 7 days OT Duration/Estimated Length of Stay: 5-7 days         Team Interventions: Nursing Interventions Patient/Family Education;Disease Management/Prevention;Pain Management;Medication Management;Skin Care/Wound Management  PT interventions Ambulation/gait training;Balance/vestibular training;Community reintegration;Discharge  planning;Neuromuscular re-education;Functional mobility training;DME/adaptive equipment instruction;Pain management;Patient/family education;Psychosocial support;Skin care/wound management;UE/LE Coordination activities;Splinting/orthotics;UE/LE Strength taining/ROM;Therapeutic Exercise;Therapeutic Activities;Wheelchair propulsion/positioning;Stair training  OT Interventions Balance/vestibular training;Cognitive remediation/compensation;Community reintegration;Discharge planning;DME/adaptive equipment instruction;Functional mobility training;Pain management;Psychosocial support;Skin care/wound managment;Self Care/advanced ADL retraining;Therapeutic Activities;Therapeutic Exercise;UE/LE Strength taining/ROM  SLP Interventions    TR Interventions    SW/CM Interventions      Team Discharge Planning: Destination: PT-Home ,OT- Home , SLP-  Projected Follow-up: PT-Home health PT, OT-  Home health OT, SLP-  Projected Equipment Needs: PT-Wheelchair (measurements);Wheelchair cushion (measurements);Rolling walker with 5" wheels, OT- To be determined, SLP-  Equipment Details: PT-Patient owns RW and motorized scooter; Recommendations TBD upon discharge, OT-  Patient/family involved in discharge planning: PT- Patient,  OT-Patient, SLP-   MD ELOS: 5-7 days Medical Rehab Prognosis:  Excellent Assessment: The patient has been admitted for CIR therapies. The team will be addressing, functional mobility, strength, stamina, balance, safety, adaptive techniques/equipment, self-care, bowel and bladder mgt, patient and caregiver education, pre-prosthetic education, pain mgt, wound care. Goals have been set at mod I for mobiltiy and self-care.    Ranelle Oyster, MD, FAAPMR      See Team Conference Notes for weekly updates to the plan of care

## 2013-02-11 NOTE — Progress Notes (Signed)
Subjective/Complaints: Still having phantom pain, especially overnight.  A 12 point review of systems has been performed and if not noted above is otherwise negative.   Objective: Vital Signs: Blood pressure 137/75, pulse 79, temperature 98.5 F (36.9 C), temperature source Oral, resp. rate 17, height 5\' 8"  (1.727 m), weight 79.1 kg (174 lb 6.1 oz), SpO2 90.00%. No results found.  Recent Labs  02/10/13 0605  WBC 6.8  HGB 9.9*  HCT 31.1*  PLT 205    Recent Labs  02/10/13 0605  NA 138  K 4.4  CL 105  GLUCOSE 104*  BUN 43*  CREATININE 1.81*  CALCIUM 8.5   CBG (last 3)   Recent Labs  02/10/13 1618 02/10/13 2045 02/11/13 0718  GLUCAP 210* 131* 104*    Wt Readings from Last 3 Encounters:  02/10/13 79.1 kg (174 lb 6.1 oz)  02/06/13 80.3 kg (177 lb 0.5 oz)  02/06/13 80.3 kg (177 lb 0.5 oz)    Physical Exam:  Constitutional: He is oriented to person, place, and time. He appears well-developed and well-nourished.  HENT:  Head: Normocephalic and atraumatic.  Eyes: Conjunctivae are normal. Pupils are equal, round, and reactive to light.  Neck: Neck supple.  Cardiovascular: Normal rate and regular rhythm. No rubs, gallops  No murmur heard.  Respiratory: Effort normal and breath sounds normal. No respiratory distress. He has no wheezes.  GI: Soft. Bowel sounds are normal. He exhibits no distension. There is no tenderness.  Musculoskeletal:  L-AKA with staples intact--clean, dry. Shaped well Neurological: He is alert and oriented to person, place, and time.  Follows commands. Strength grossly intact in UE's at 5/5. RLE is 4/5 prox to 5/5 distally. Left HF 2+/5. mild sensory loss in distal right leg/foot.  Skin: Intact. RLE and BUE with old well-healed incisions.  Psychiatric: He has a normal mood and affect. His speech is normal and behavior is normal. Judgment and thought content normal. Cognition and memory are normal.      Assessment/Plan: 1. Functional  deficits secondary to left AKA which require 3+ hours per day of interdisciplinary therapy in a comprehensive inpatient rehab setting. Physiatrist is providing close team supervision and 24 hour management of active medical problems listed below. Physiatrist and rehab team continue to assess barriers to discharge/monitor patient progress toward functional and medical goals. FIM: FIM - Bathing Bathing Steps Patient Completed: Chest;Right Arm;Left Arm;Abdomen;Front perineal area;Buttocks;Right upper leg;Left upper leg;Right lower leg (including foot) Bathing: 5: Set-up assist to: Adjust water temp  FIM - Upper Body Dressing/Undressing Upper body dressing/undressing steps patient completed: Thread/unthread right sleeve of pullover shirt/dresss;Thread/unthread left sleeve of pullover shirt/dress;Put head through opening of pull over shirt/dress;Pull shirt over trunk Upper body dressing/undressing: 5: Set-up assist to: Obtain clothing/put away FIM - Lower Body Dressing/Undressing Lower body dressing/undressing steps patient completed: Thread/unthread right pants leg;Thread/unthread left pants leg;Pull pants up/down;Don/Doff right shoe Lower body dressing/undressing: 5: Set-up assist to: Don/Doff AFO/prosthesis/orthosis  FIM - Toileting Toileting steps completed by patient: Adjust clothing prior to toileting;Performs perineal hygiene;Adjust clothing after toileting Toileting: 5: Set-up assist to: Obtain supplies  FIM - Toilet Transfers Toilet Transfers: 5-To toilet/BSC: Supervision (verbal cues/safety issues);5-From toilet/BSC: Supervision (verbal cues/safety issues)  FIM - Bed/Chair Transfer Bed/Chair Transfer Assistive Devices: Bed rails;Arm rests Bed/Chair Transfer: 5: Supine > Sit: Supervision (verbal cues/safety issues)  FIM - Locomotion: Wheelchair Distance: 100 Locomotion: Wheelchair: 2: Travels 50 - 149 ft with supervision, cueing or coaxing FIM - Locomotion: Ambulation Locomotion:  Ambulation Assistive Devices: Designer, industrial/product  Ambulation/Gait Assistance: 4: Min assist Locomotion: Ambulation: 1: Travels less than 50 ft with minimal assistance (Pt.>75%)  Comprehension Comprehension Mode: Auditory Comprehension: 7-Follows complex conversation/direction: With no assist  Expression Expression Mode: Verbal Expression: 7-Expresses complex ideas: With no assist  Social Interaction Social Interaction: 7-Interacts appropriately with others - No medications needed.  Problem Solving Problem Solving: 7-Solves complex problems: Recognizes & self-corrects  Memory Memory: 7-Complete Independence: No helper Medical Problem List and Plan:  1. DVT Prophylaxis/Anticoagulation: Pharmaceutical: Coumadin resumed but still sub therapeutic--continue lovenox till INR > 2.0.  2. Pain Management: Continue neurontin for diabetic neuropathy. Add hs tramadol to help with phantom limb pain 3. H/o depression/ Mood: Seems a little anxious. Answered questions regarding healing and time frame of prosthesis. Will monitor for now and have LCSW follow for evaluation.  4. Neuropsych: This patient is capable of making decisions on his own behalf.  5. CAD with ICD/PPM: Monitor for any symptoms with increase in activity level. Continue coreg, lasix, cozaar and Lipitor.  6. DM type 2 with neuropathy: CM diet. Continue current 70/30 insulin. SSI 7. A fib: Will monitor HR with bid checks. Continue coreg bid. Chronic coumadin resumed.  8. Chronic systolic CHF: Monitor weights qod. Low salt restrictions. Continue cozaar, lasix, coreg and lipitor.  9. BPH: Continue proscar. Monitor for voiding problems post op.  10. ABLA: Will monitor for now. Recheck hgb 9.9 11. CKD?: baseline BUN/Cr--40/2.0+ range per records.--labs consistent with that  12. Wound: stump sock and education per prosthetist  LOS (Days) 2 A FACE TO FACE EVALUATION WAS PERFORMED  Juana Montini T 02/11/2013 8:39 AM

## 2013-02-11 NOTE — Progress Notes (Signed)
Occupational Therapy Session Note  Patient Details  Name: [name] MRN: [id number] Date of Birth: 02-22-1935  Today's Date: 02/11/2013 Time: 0730-0810 Time Calculation (min): 40 min  Short Term Goals: Week 1:  OT Short Term Goal 1 (Week 1): STG=LTG  Skilled Therapeutic Interventions/Progress Updates:    1:1 self care retraining at shower level with focus on functional ambulation from bed to bathroom with RW, toilet transfer, toileting, sit to stands, safe stand pivot transfers, standing balance with at least one UE support, d/c planning, etc. Pt continues to be unsure whose house he will stay at in [place] at this time- so continuing to problem solve different home situations.   Therapy Documentation Precautions:  Precautions Precautions: Fall Precaution Comments: phantom pain Restrictions Weight Bearing Restrictions: No Pain: Pain Assessment Pain Assessment: 0-10 Pain Score: 5  Pain Type: Phantom pain Pain Location: Leg Pain Orientation: Left Pain Descriptors / Indicators: Crushing Pain Frequency: Constant Pain Onset: On-going Pain Intervention(s): Medication (See eMAR) RN aware  See FIM for current functional status  Therapy/Group: Individual Therapy  [name] [facility] 02/11/2013, 8:30 AM

## 2013-02-11 NOTE — Progress Notes (Signed)
ANTICOAGULATION CONSULT NOTE - Initial Consult  Pharmacy Consult for Coumadin Indication: atrial fibrillation  Allergies  Allergen Reactions  . Other Other (See Comments)    Plastic tape tears skin    Patient Measurements: Height: 5\' 8"  (172.7 cm) Weight: 174 lb 6.1 oz (79.1 kg) IBW/kg (Calculated) : 68.4    Vital Signs: Temp: 98.5 F (36.9 C) (12/11 0522) Temp src: Oral (12/11 0522) BP: 137/75 mmHg (12/11 0522) Pulse Rate: 79 (12/11 0522)  Labs:  Recent Labs  02/09/13 2153 02/10/13 0605 02/10/13 1400 02/11/13 0550  HGB  --  9.9*  --   --   HCT  --  31.1*  --   --   PLT  --  205  --   --   LABPROT 14.1  --  15.1 16.9*  INR 1.11  --  1.22 1.41  CREATININE  --  1.81*  --   --     Estimated Creatinine Clearance: 33.6 ml/min (by C-G formula based on Cr of 1.81).   Assessment: [name] on coumadin PTA for atrial fibrillation. INR 1.41, subtherapeutic but trending up. Dose increased to 5mg  since 12/9, no new cbc, No bleeding noted per chart. He is also on lovenox 30 mg sq Q12hrs for VTE px until INR > 2.0   PTA dose: 2.5 mg daily EXCEPT for none on Thursday  Goal of Therapy:  INR 2-3    Plan:  Give Coumadin 5 mg po today x1 Daily PT/INR  Bayard Hugger, PharmD, BCPS  Clinical Pharmacist  Pager: [phone]   02/11/2013,1:25 PM

## 2013-02-12 ENCOUNTER — Encounter (HOSPITAL_COMMUNITY): Payer: Medicare Other | Admitting: Occupational Therapy

## 2013-02-12 ENCOUNTER — Inpatient Hospital Stay (HOSPITAL_COMMUNITY): Payer: Medicare Other | Admitting: *Deleted

## 2013-02-12 LAB — GLUCOSE, CAPILLARY
Glucose-Capillary: 114 mg/dL — ABNORMAL HIGH (ref 70–99)
Glucose-Capillary: 99 mg/dL (ref 70–99)
Glucose-Capillary: 172 mg/dL — ABNORMAL HIGH (ref 70–99)

## 2013-02-12 LAB — PROTIME-INR: Prothrombin Time: 18.9 seconds — ABNORMAL HIGH (ref 11.6–15.2)

## 2013-02-12 MED ORDER — WARFARIN SODIUM 5 MG PO TABS
5.0000 mg | ORAL_TABLET | Freq: Once | ORAL | Status: AC
  Administered 2013-02-12: 5 mg via ORAL
  Filled 2013-02-12: qty 1

## 2013-02-12 NOTE — Progress Notes (Signed)
Occupational Therapy Session Note  Patient Details  Name: MAURIO BAIZE MRN: [id number] Date of Birth: [birth year]  Today's Date: 02/12/2013 Time: 0730-0815 Time Calculation (min): 45 min  Short Term Goals: Week 1:  OT Short Term Goal 1 (Week 1): STG=LTG  Skilled Therapeutic Interventions/Progress Updates:    1:1 pt reported already washing up at the sink and dressing at w/c level. Focus on crossing thresholds forwards and backwards with RW with steady A in prep for community, home and shower stall thresholds. Dicussed goals for community outing today and problem solving different ways he could access public bathrooms.  Therapy Documentation Precautions:  Precautions Precautions: Fall Precaution Comments: phantom pain Restrictions Weight Bearing Restrictions: No Pain:  residual phantom limb pain - RN aware  See FIM for current functional status  Therapy/Group: Individual Therapy  [name] [facility] 02/12/2013, 12:55 PM

## 2013-02-12 NOTE — Progress Notes (Signed)
Recreational Therapy Session Note  Patient Details  Name: [name] MRN: [id number] Date of Birth: 1935-01-04 Today's Date: 02/12/2013 Time:  1030-12 Pain: 6/10 phantom pain LLE, premedicated Skilled Therapeutic Interventions/Progress Updates: Pt referred by team for community reintegration/outing.  Chart reviewed and pt agreeable for participation.  Pt participated in grocery store outing w/c level with supervision. Education provided on obstacle negotiation & energy conservation techniques.  See outing goal sheet for full details. Suhaylah Wampole 02/12/2013, 12:30 PM

## 2013-02-12 NOTE — Progress Notes (Signed)
ANTICOAGULATION CONSULT NOTE - Initial Consult  Pharmacy Consult for Coumadin Indication: atrial fibrillation  Allergies  Allergen Reactions  . Other Other (See Comments)    Plastic tape tears skin    Patient Measurements: Height: 5\' 8"  (172.7 cm) Weight: 174 lb 6.1 oz (79.1 kg) IBW/kg (Calculated) : 68.4    Vital Signs: Temp: 98.1 F (36.7 C) (12/12 0638) Temp src: Oral (12/12 0638) BP: 147/78 mmHg (12/12 0638) Pulse Rate: 70 (12/12 0638)  Labs:  Recent Labs  02/10/13 0605 02/10/13 1400 02/11/13 0550 02/12/13 0655  HGB 9.9*  --   --   --   HCT 31.1*  --   --   --   PLT 205  --   --   --   LABPROT  --  15.1 16.9* 18.9*  INR  --  1.22 1.41 1.63*  CREATININE 1.81*  --   --   --     Estimated Creatinine Clearance: 33.6 ml/min (by C-G formula based on Cr of 1.81).   Assessment: [name] on coumadin PTA for atrial fibrillation. INR 1.63 this am, subtherapeutic but continues to trend up. Dose increased to 5mg  since 12/9, no new cbc, No bleeding noted per chart. He is also on lovenox 30 mg sq Q12hrs for VTE px until INR > 2.0   PTA dose: 2.5 mg daily EXCEPT for none on Thursday  Goal of Therapy:  INR 2-3    Plan:  INR continues to rise, give Coumadin 5 mg po x 1 tonight Daily PT/INR  Darby Fleeman B. Artelia Laroche, PharmD Clinical Pharmacist - Resident Pager: [phone] Phone: [phone] 02/12/2013 1:09 PM

## 2013-02-12 NOTE — Progress Notes (Signed)
Subjective/Complaints: Phantom pain a little better yesterday and today. Making nice progress with therapies--he is pleased.   A 12 point review of systems has been performed and if not noted above is otherwise negative.   Objective: Vital Signs: Blood pressure 147/78, pulse 70, temperature 98.1 F (36.7 C), temperature source Oral, resp. rate 17, height 5\' 8"  (1.727 m), weight 79.1 kg (174 lb 6.1 oz), SpO2 98.00%. No results found.  Recent Labs  02/10/13 0605  WBC 6.8  HGB 9.9*  HCT 31.1*  PLT 205    Recent Labs  02/10/13 0605  NA 138  K 4.4  CL 105  GLUCOSE 104*  BUN 43*  CREATININE 1.81*  CALCIUM 8.5   CBG (last 3)   Recent Labs  02/11/13 1622 02/11/13 2032 02/12/13 0741  GLUCAP 146* 128* 114*    Wt Readings from Last 3 Encounters:  02/10/13 79.1 kg (174 lb 6.1 oz)  02/06/13 80.3 kg (177 lb 0.5 oz)  02/06/13 80.3 kg (177 lb 0.5 oz)    Physical Exam:  Constitutional: He is oriented to person, place, and time. He appears well-developed and well-nourished.  HENT:  Head: Normocephalic and atraumatic.  Eyes: Conjunctivae are normal. Pupils are equal, round, and reactive to light.  Neck: Neck supple.  Cardiovascular: Normal rate and regular rhythm. No rubs, gallops  No murmur heard.  Respiratory: Effort normal and breath sounds normal. No respiratory distress. He has no wheezes.  GI: Soft. Bowel sounds are normal. He exhibits no distension. There is no tenderness.  Musculoskeletal:  L-AKA with staples intact--clean, dry. Shaped well Neurological: He is alert and oriented to person, place, and time.  Follows commands. Strength grossly intact in UE's at 5/5. RLE is 4/5 prox to 5/5 distally. Left HF 2+/5. mild sensory loss in distal right leg/foot.  Skin: Intact. RLE and BUE with old well-healed incisions.  Psychiatric: He has a normal mood and affect. His speech is normal and behavior is normal. Judgment and thought content normal. Cognition and memory are  normal.      Assessment/Plan: 1. Functional deficits secondary to left AKA which require 3+ hours per day of interdisciplinary therapy in a comprehensive inpatient rehab setting. Physiatrist is providing close team supervision and 24 hour management of active medical problems listed below. Physiatrist and rehab team continue to assess barriers to discharge/monitor patient progress toward functional and medical goals. FIM: FIM - Bathing Bathing Steps Patient Completed: Chest;Right Arm;Left Arm;Abdomen;Front perineal area;Buttocks;Right upper leg;Left upper leg;Right lower leg (including foot) Bathing: 5: Set-up assist to: Adjust water temp  FIM - Upper Body Dressing/Undressing Upper body dressing/undressing steps patient completed: Thread/unthread right sleeve of pullover shirt/dresss;Thread/unthread left sleeve of pullover shirt/dress;Put head through opening of pull over shirt/dress;Pull shirt over trunk Upper body dressing/undressing: 5: Set-up assist to: Obtain clothing/put away FIM - Lower Body Dressing/Undressing Lower body dressing/undressing steps patient completed: Thread/unthread right pants leg;Thread/unthread left pants leg;Pull pants up/down;Don/Doff right shoe Lower body dressing/undressing: 5: Set-up assist to: Don/Doff AFO/prosthesis/orthosis  FIM - Toileting Toileting steps completed by patient: Adjust clothing prior to toileting;Performs perineal hygiene;Adjust clothing after toileting Toileting: 5: Set-up assist to: Obtain supplies  FIM - Toilet Transfers Toilet Transfers: 5-To toilet/BSC: Supervision (verbal cues/safety issues);5-From toilet/BSC: Supervision (verbal cues/safety issues)  FIM - Press photographer Assistive Devices: Arm rests Bed/Chair Transfer: 6: Assistive device: no helper;6: Sit > Supine: No assist;6: Bed > Chair or W/C: No assist;6: Chair or W/C > Bed: No assist;6: Supine > Sit: No assist  FIM -  Locomotion: Wheelchair Distance:  175 Locomotion: Wheelchair: 5: Travels 150 ft or more: maneuvers on rugs and over door sills with supervision, cueing or coaxing FIM - Locomotion: Ambulation Locomotion: Ambulation Assistive Devices: Designer, industrial/product Ambulation/Gait Assistance: 4: Min assist Locomotion: Ambulation: 2: Travels 50 - 149 ft with minimal assistance (Pt.>75%)  Comprehension Comprehension Mode: Auditory Comprehension: 7-Follows complex conversation/direction: With no assist  Expression Expression Mode: Verbal Expression: 7-Expresses complex ideas: With no assist  Social Interaction Social Interaction: 7-Interacts appropriately with others - No medications needed.  Problem Solving Problem Solving: 7-Solves complex problems: Recognizes & self-corrects  Memory Memory: 7-Complete Independence: No helper Medical Problem List and Plan:  1. DVT Prophylaxis/Anticoagulation: Pharmaceutical: Coumadin resumed but still sub therapeutic--continue lovenox till INR > 2.0.  2. Pain Management: Increased gabapentin for phantom limb pain (600mg  tid)--this seems to have helped. Continue hs tramadol as well. 3. H/o depression/ Mood: seems to be handling things well at present. Team continues to provide egosupport 4. Neuropsych: This patient is capable of making decisions on his own behalf.  5. CAD with ICD/PPM: Monitor for any symptoms with increase in activity level. Continue coreg, lasix, cozaar and Lipitor.  6. DM type 2 with neuropathy: CM diet. Continue current 70/30 insulin. SSI---sugars stabilizing 7. A fib: Will monitor HR with bid checks. Continue coreg bid. Chronic coumadin resumed.  8. Chronic systolic CHF: Monitor weights qod. Low salt restrictions. Continue cozaar, lasix, coreg and lipitor.  9. BPH: Continue proscar. Monitor for voiding problems post op.  10. ABLA: Will monitor for now. Recheck hgb 9.9 11. CKD?: baseline BUN/Cr--40/2.0+ range per records.--labs consistent with that  12. Wound: stump sock and  education per prosthetist  -will have orthotist deliver stump shrinker on monday  LOS (Days) 3 A FACE TO FACE EVALUATION WAS PERFORMED  Callen Zuba T 02/12/2013 10:01 AM

## 2013-02-12 NOTE — Progress Notes (Signed)
Physical Therapy Session Note  Patient Details  Name: [name] MRN: [id number] Date of Birth: 07-28-1934  Today's Date: 02/12/2013 Time: 4098-1191 Time Calculation (min): 60 min  Short Term Goals: Week 1:  PT Short Term Goal 1 (Week 1): STGs=LTGs secondary to ELOS  Skilled Therapeutic Interventions/Progress Updates:    Patient received sitting in recliner, son present for session to observe. Session focused on wheelchair mobility, stair negotiation, and floor transfers; see details below. Discussion/education with patient and son about falls risk at home and indications vs. Contraindications for attempting floor transfer vs. Calling EMS. Patient performed floor transfer with minA using wheelchair for B UE support. Patient returned to room and left supine in bed with all needs within reach, family present.  Therapy Documentation Precautions:  Precautions Precautions: Fall Precaution Comments: phantom pain Restrictions Weight Bearing Restrictions: No Pain: Pain Assessment Pain Assessment: 0-10 Pain Score: 6  Pain Type: Phantom pain Pain Location: Leg Pain Orientation: Left Pain Descriptors / Indicators: Aching Pain Onset: On-going Pain Intervention(s): Ambulation/increased activity;Repositioned Multiple Pain Sites: No Locomotion : Ambulation Ambulation/Gait Assistance: 4: Min assist Stairs / Additional Locomotion Stairs: Yes Stairs Assistance: 4: Min assist Stairs Assistance Details: Visual cues for safe use of DME/AE;Visual cues/gestures for sequencing;Verbal cues for sequencing;Verbal cues for technique;Verbal cues for precautions/safety Stairs Assistance Details (indicate cue type and reason): Bumps up/down stairs on bottom. Stair Management Technique: Seated/boosting;No rails Number of Stairs: 6 Height of Stairs: 7 Ramp: 6: Modified independent Surveyor, minerals) Corporate treasurer: Yes Wheelchair Assistance: 6: Modified independent (Device/Increase  time) Occupational hygienist: Both upper extremities;Right lower extremity Wheelchair Parts Management: Independent Distance: >150   See FIM for current functional status  Therapy/Group: Individual Therapy  Chipper Herb. Chris Cripps, PT, DPT 02/12/2013, 3:52 PM

## 2013-02-12 NOTE — Progress Notes (Signed)
Orthopedic Tech Progress Note Patient Details:  [name] January 26, 1935 [id number] [facility] called for brace order Patient ID: [name], male   DOB: [birth year], 78 y.o.   MRN: [id number]   [name] 02/12/2013, 4:35 PM

## 2013-02-12 NOTE — Progress Notes (Signed)
[facility] Individual Statement of Services  Patient Name:  [name]  Date:  02/12/2013  Welcome to the [facility].  Our goal is to provide you with an individualized program based on your diagnosis and situation, designed to meet your specific needs.  With this comprehensive rehabilitation program, you will be expected to participate in at least 3 hours of rehabilitation therapies Monday-Friday, with modified therapy programming on the weekends.  Your rehabilitation program will include the following services:  Physical Therapy (PT), Occupational Therapy (OT), 24 hour per day rehabilitation nursing, Neuropsychology, Case Management (Social Worker), Rehabilitation Medicine, Nutrition Services and Pharmacy Services  Weekly team conferences will be held on Tuesdays to discuss your progress.  Your Social Worker will talk with you frequently to get your input and to update you on team discussions.  Team conferences with you and your family in attendance may also be held.  Expected length of stay: 6-7 days  Overall anticipated outcome: Modified Independent and supervision with                                                                                 Car transfers, tub/shower transfers, stairs, meal preparation  Depending on your progress and recovery, your program may change. Your Social Worker will coordinate services and will keep you informed of any changes. Your Social Worker's name and contact numbers are listed  below.  The following services may also be recommended but are not provided by the [facility]:   Driving Evaluations  Home Health Rehabiltiation Services  Outpatient Rehabilitation Services   Arrangements will be made to provide these services after discharge if needed.  Arrangements include referral to agencies that provide these services.  Your insurance has been verified to be:  [employer] Your  primary doctor is:  Dr. Gershon Crane  Pertinent information will be shared with your doctor and your insurance company.  Social Worker:  Staci Acosta, LCSW  [phone] or [phone]  Information discussed with and copy given to patient by: Elvera Lennox, 02/12/2013, 1:32 PM

## 2013-02-12 NOTE — Progress Notes (Signed)
Physical Therapy Note  Patient Details  Name: REGINOLD BEALE MRN: [id number] Date of Birth: [birth year] Today's Date: 02/12/2013  Time: 1030-12  Pain: 6/10 phantom pain LLE, premedicated  Patient referred by team for community reintegration/outing. Chart reviewed and patient agreeable for participation. Patient participated in grocery store outing w/c level with supervision. Education provided on obstacle negotiation & energy conservation techniques. See outing goal sheet for full details.   Zella Richer Angle Karel S. Dhwani Venkatesh, PT, DPT 02/12/2013, 3:56 PM

## 2013-02-13 ENCOUNTER — Inpatient Hospital Stay (HOSPITAL_COMMUNITY): Payer: Medicare Other | Admitting: Physical Therapy

## 2013-02-13 ENCOUNTER — Inpatient Hospital Stay (HOSPITAL_COMMUNITY): Payer: Medicare Other | Admitting: Occupational Therapy

## 2013-02-13 ENCOUNTER — Encounter (HOSPITAL_COMMUNITY): Payer: Medicare Other | Admitting: Occupational Therapy

## 2013-02-13 DIAGNOSIS — D62 Acute posthemorrhagic anemia: Secondary | ICD-10-CM

## 2013-02-13 DIAGNOSIS — S78119A Complete traumatic amputation at level between unspecified hip and knee, initial encounter: Secondary | ICD-10-CM

## 2013-02-13 DIAGNOSIS — I2510 Atherosclerotic heart disease of native coronary artery without angina pectoris: Secondary | ICD-10-CM

## 2013-02-13 DIAGNOSIS — N289 Disorder of kidney and ureter, unspecified: Secondary | ICD-10-CM

## 2013-02-13 DIAGNOSIS — E1165 Type 2 diabetes mellitus with hyperglycemia: Secondary | ICD-10-CM

## 2013-02-13 DIAGNOSIS — L98499 Non-pressure chronic ulcer of skin of other sites with unspecified severity: Secondary | ICD-10-CM

## 2013-02-13 DIAGNOSIS — I739 Peripheral vascular disease, unspecified: Secondary | ICD-10-CM

## 2013-02-13 LAB — GLUCOSE, CAPILLARY
Glucose-Capillary: 101 mg/dL — ABNORMAL HIGH (ref 70–99)
Glucose-Capillary: 212 mg/dL — ABNORMAL HIGH (ref 70–99)

## 2013-02-13 LAB — PROTIME-INR: Prothrombin Time: 22 seconds — ABNORMAL HIGH (ref 11.6–15.2)

## 2013-02-13 MED ORDER — WARFARIN SODIUM 5 MG PO TABS
5.0000 mg | ORAL_TABLET | Freq: Once | ORAL | Status: AC
  Administered 2013-02-13: 5 mg via ORAL
  Filled 2013-02-13: qty 1

## 2013-02-13 MED ORDER — GABAPENTIN 400 MG PO CAPS
800.0000 mg | ORAL_CAPSULE | Freq: Three times a day (TID) | ORAL | Status: DC
  Administered 2013-02-13: 800 mg via ORAL
  Filled 2013-02-13 (×4): qty 2

## 2013-02-13 MED ORDER — GABAPENTIN 400 MG PO CAPS
800.0000 mg | ORAL_CAPSULE | Freq: Two times a day (BID) | ORAL | Status: DC
  Administered 2013-02-13 – 2013-02-15 (×4): 800 mg via ORAL
  Filled 2013-02-13 (×6): qty 2

## 2013-02-13 MED ORDER — GABAPENTIN 800 MG PO TABS
800.0000 mg | ORAL_TABLET | Freq: Two times a day (BID) | ORAL | Status: DC
  Filled 2013-02-13 (×3): qty 1

## 2013-02-13 MED ORDER — GABAPENTIN 800 MG PO TABS
800.0000 mg | ORAL_TABLET | Freq: Three times a day (TID) | ORAL | Status: DC
  Filled 2013-02-13 (×3): qty 1

## 2013-02-13 NOTE — Plan of Care (Signed)
Problem: RH PAIN MANAGEMENT Goal: RH STG PAIN MANAGED AT OR BELOW PT'S PAIN GOAL Pain level less than 3/10  Outcome: Not Progressing Remains about 7 out of 10

## 2013-02-13 NOTE — Progress Notes (Signed)
Occupational Therapy Session Note  Patient Details  Name: [name] MRN: [id number] Date of Birth: 05/29/1934  Today's Date: 02/13/2013 Time: 1330-1420 Time Calculation (min): 50 min  Short Term Goals: Week 1:  OT Short Term Goal 1 (Week 1): STG=LTG  Skilled Therapeutic Interventions/Progress Updates:    1:1 Son present for session. Simple meal prep activity of making a stir fry with chicken and vegetables. Focus on performing task at w/c level with emphasis on problem solving obtaining items at different heights, how to transport items, activity tolerance, safety in kitchen at w/c level, sit to stand, standing balance with w/c parked behind pt etc. Pt able to  Perform with minimal setup  Therapy Documentation Precautions:  Precautions Precautions: Fall Precaution Comments: phantom pain Restrictions Weight Bearing Restrictions: NoGeneral:   Vital Signs:   Pain: Pain Assessment Pain Assessment: 0-10 Pain Score: 7  Pain Type: Phantom pain Pain Location: Leg Pain Orientation: Left Pain Descriptors / Indicators: Aching Pain Frequency: Intermittent Pain Onset: On-going Pain Intervention(s): Medication (See eMAR) Rn called in session to come with meds due to residual limb pain   See FIM for current functional status  Therapy/Group: Individual Therapy  [name] [facility] 02/13/2013, 3:23 PM

## 2013-02-13 NOTE — Progress Notes (Signed)
Physical Therapy Session Note  Patient Details  Name: [name] MRN: [id number] Date of Birth: 1935/01/04  Today's Date: 02/13/2013 Time: 1500-1600 Time Calculation (min): 60 min  Short Term Goals: Week 1:  PT Short Term Goal 1 (Week 1): STGs=LTGs secondary to ELOS   Therapy Documentation Precautions:  Precautions Precautions: Fall Precaution Comments: phantom pain Restrictions Weight Bearing Restrictions: No Pain: no pain at rest but notes 5/10 pain in shoulder/chest musculature with weight bearing through the UE's.   During tx session, patient getting occasional severe shooting phantom pains into L "foot/toes" (patient is a L AKA)  Therapeutic Activity:(60') Transfer Training bed<->w/c via squat pivot transfer with S/Independent assist. Car transfers for different height vehicles with S/Independent. Stair training to include sitting onto step and advancing up and down 3 steps on buttocks with min-a to transfer from w/c to sitting on step and patient using handrail to assist. Min/Mod-assist to stand up from sitting on top platform with patient using handrail with B hands for assist. Up/down 3 steps using single handrail on R ascending and Mod-assist for hopping but was only able to advance 2 steps. Descending steps using handrail on L side with min/Mod-assist. Patient's son present throughout tx session who gave good insight and discussion re; first going home before travelling up to [place] for Christmas where he will need to negotiate stairs. Patient took frequent rest breaks and is also experiencing sharp phantom pains into L LE (Patient is L AKA)   Therapy/Group: Individual Therapy  Rex Kras 02/13/2013, 4:25 PM

## 2013-02-13 NOTE — Progress Notes (Signed)
Occupational Therapy Session Note  Patient Details  Name: [name] MRN: [id number] Date of Birth: [birth year]  Today's Date: 02/13/2013 Time: 0900-0930 Time Calculation (min): 30 min  Short Term Goals: Week 1:  OT Short Term Goal 1 (Week 1): STG=LTG  Skilled Therapeutic Interventions/Progress Updates:    Patient seen this am for daily self care training.  Patient bathed and dressed himself, cleaning peri area from commode seat with modified independence.  Reviewed need to practice clearing lip of shower stall with walker.  Initially patient able to clear only .5 inch.  Practiced increasing push down through arms (latissimus strengthening) to lift self higher off floor.  Did standing hops with increased hip and knee flexion. Patient able to clear foot repetitively 4-5 inches's off floor.  Transitioned to forward and backward hop as needed for clearing shower stall.  Patient reported sore arms from "all this practice this week!"  Patient issued inspection mirror to be able to check incision on left leg, as well as check all aspects of right foot for pressure areas, or wounds.  Discussed the benefit of having toe nails professionally cut, vs cutting himself as circulation has been so problematic in lower extremities.  Patient in agreement.    Therapy Documentation Precautions:  Precautions Precautions: Fall Precaution Comments: phantom pain Restrictions Weight Bearing Restrictions: No    Pain: Pain Assessment Pain Assessment: 0-10 Pain Score: 5  Pain Type: Phantom pain Pain Location: Leg Pain Orientation: Left Pain Onset: On-going  See IM for current functional status  Therapy/Group: Individual Therapy  Collier Salina 02/13/2013, 10:53 AM

## 2013-02-13 NOTE — Progress Notes (Signed)
ANTICOAGULATION CONSULT NOTE - Initial Consult  Pharmacy Consult for Coumadin Indication: atrial fibrillation  Allergies  Allergen Reactions  . Other Other (See Comments)    Plastic tape tears skin    Patient Measurements: Height: 5\' 8"  (172.7 cm) Weight: 166 lb 14.2 oz (75.7 kg) IBW/kg (Calculated) : 68.4    Vital Signs: Temp: 98 F (36.7 C) (12/13 0524) Temp src: Oral (12/13 0524) BP: 128/69 mmHg (12/13 0524) Pulse Rate: 71 (12/13 0524)  Labs:  Recent Labs  02/11/13 0550 02/12/13 0655 02/13/13 0546  LABPROT 16.9* 18.9* 22.0*  INR 1.41 1.63* 1.99*    Estimated Creatinine Clearance: 33.6 ml/min (by C-G formula based on Cr of 1.81).   Assessment: [name] on coumadin PTA for atrial fibrillation. INR 1.99 this am, subtherapeutic but continuing to trend up. Dose increased to 5mg  since 12/9, no new cbc, No bleeding noted per chart. He is also on lovenox 30 mg sq Q12hrs for VTE px until INR > 2.0 per Delle Reining, PA.  PTA dose: 2.5 mg daily EXCEPT for none on Thursday  Goal of Therapy:  INR 2-3  Plan:  1. warfarin 5 mg po x 1 tonight 2. Daily PT/INR  [name], PharmD, BCPS Clinical Pharmacist Pager: [phone] 02/13/2013 5:02 PM

## 2013-02-13 NOTE — Progress Notes (Addendum)
Subjective/Complaints: Phantom pain kept pt awake last noc. Making nice progress with therapies--he is pleased.  Mod I in room A 12 point review of systems has been performed and if not noted above is otherwise negative.   Objective: Vital Signs: Blood pressure 128/69, pulse 71, temperature 98 F (36.7 C), temperature source Oral, resp. rate 20, height 5\' 8"  (1.727 m), weight 75.7 kg (166 lb 14.2 oz), SpO2 99.00%. No results found. No results found for this basename: WBC, HGB, HCT, PLT,  in the last 72 hours No results found for this basename: NA, K, CL, CO, GLUCOSE, BUN, CREATININE, CALCIUM,  in the last 72 hours CBG (last 3)   Recent Labs  02/12/13 1200 02/12/13 1629 02/12/13 2053  GLUCAP 99 111* 172*    Wt Readings from Last 3 Encounters:  02/13/13 75.7 kg (166 lb 14.2 oz)  02/06/13 80.3 kg (177 lb 0.5 oz)  02/06/13 80.3 kg (177 lb 0.5 oz)    Physical Exam:  Constitutional: He is oriented to person, place, and time. He appears well-developed and well-nourished.  HENT:  Head: Normocephalic and atraumatic.  Eyes: Conjunctivae are normal. Pupils are equal, round, and reactive to light.  Neck: Neck supple.  Cardiovascular: Normal rate and regular rhythm. No rubs, gallops  No murmur heard.  Respiratory: Effort normal and breath sounds normal. No respiratory distress. He has no wheezes.  GI: Soft. Bowel sounds are normal. He exhibits no distension. There is no tenderness.  Musculoskeletal:  L-AKA with staples intact--clean, dry. Shaped well Neurological: He is alert and oriented to person, place, and time.  Follows commands. Strength grossly intact in UE's at 5/5. RLE is 4/5 prox to 5/5 distally. Left HF 2+/5. mild sensory loss in distal right leg/foot.  Skin: Intact. RLE and BUE with old well-healed incisions.  Psychiatric: He has a normal mood and affect. His speech is normal and behavior is normal. Judgment and thought content normal. Cognition and memory are normal.       Assessment/Plan: 1. Functional deficits secondary to left AKA which require 3+ hours per day of interdisciplinary therapy in a comprehensive inpatient rehab setting. Physiatrist is providing close team supervision and 24 hour management of active medical problems listed below. Physiatrist and rehab team continue to assess barriers to discharge/monitor patient progress toward functional and medical goals. FIM: FIM - Bathing Bathing Steps Patient Completed: Chest;Right Arm;Left Arm;Abdomen;Front perineal area;Buttocks;Right upper leg;Left upper leg;Right lower leg (including foot) Bathing: 5: Set-up assist to: Adjust water temp  FIM - Upper Body Dressing/Undressing Upper body dressing/undressing steps patient completed: Thread/unthread right sleeve of pullover shirt/dresss;Thread/unthread left sleeve of pullover shirt/dress;Put head through opening of pull over shirt/dress;Pull shirt over trunk Upper body dressing/undressing: 5: Set-up assist to: Obtain clothing/put away FIM - Lower Body Dressing/Undressing Lower body dressing/undressing steps patient completed: Thread/unthread right pants leg;Thread/unthread left pants leg;Pull pants up/down;Don/Doff right shoe Lower body dressing/undressing: 5: Set-up assist to: Don/Doff AFO/prosthesis/orthosis  FIM - Toileting Toileting steps completed by patient: Adjust clothing prior to toileting;Performs perineal hygiene;Adjust clothing after toileting Toileting: 5: Set-up assist to: Obtain supplies  FIM - Toilet Transfers Toilet Transfers: 5-To toilet/BSC: Supervision (verbal cues/safety issues);5-From toilet/BSC: Supervision (verbal cues/safety issues)  FIM - Press photographer Assistive Devices: Arm rests;Bed rails Bed/Chair Transfer: 6: Assistive device: no helper;6: Sit > Supine: No assist;6: Bed > Chair or W/C: No assist;6: Chair or W/C > Bed: No assist;6: Supine > Sit: No assist  FIM - Locomotion:  Wheelchair Distance: >150 Locomotion: Wheelchair: 6: [employer]  150 ft or more, turns around, maneuvers to table, bed or toilet, negotiates 3% grade: maneuvers on rugs and over door sills independently FIM - Locomotion: Ambulation Locomotion: Ambulation Assistive Devices: Walker - Rolling Ambulation/Gait Assistance: 4: Min assist Locomotion: Ambulation: 2: Travels 50 - 149 ft with minimal assistance (Pt.>75%)  Comprehension Comprehension Mode: Auditory Comprehension: 7-Follows complex conversation/direction: With no assist  Expression Expression Mode: Verbal Expression: 7-Expresses complex ideas: With no assist  Social Interaction Social Interaction: 7-Interacts appropriately with others - No medications needed.  Problem Solving Problem Solving: 7-Solves complex problems: Recognizes & self-corrects  Memory Memory: 7-Complete Independence: No helper Medical Problem List and Plan:  1. DVT Prophylaxis/Anticoagulation: Pharmaceutical: Coumadin resumed but still sub therapeutic--continue lovenox till INR > 2.0.  2. Pain Management:Change gabapentin for phantom limb pain 800mg  BID--Has reduced GFR would not increase beyong this. Continue hs tramadol as well. 3. H/o depression/ Mood: seems to be handling things well at present. Team continues to provide egosupport 4. Neuropsych: This patient is capable of making decisions on his own behalf.  5. CAD with ICD/PPM: Monitor for any symptoms with increase in activity level. Continue coreg, lasix, cozaar and Lipitor.  6. DM type 2 with neuropathy: CM diet. Continue current 70/30 insulin. SSI---sugars stabilizing 7. A fib: Will monitor HR with bid checks. Continue coreg bid. Chronic coumadin resumed.  8. Chronic systolic CHF: Monitor weights qod. Low salt restrictions. Continue cozaar, lasix, coreg and lipitor.  9. BPH: Continue proscar. Monitor for voiding problems post op.  10. ABLA: Will monitor for now. Recheck hgb 9.9 11. CKD?: baseline  BUN/Cr--40/2.0+ range per records.--labs consistent with that  12. Wound: stump sock and education per prosthetist  -will have orthotist deliver stump shrinker on monday  LOS (Days) 4 A FACE TO FACE EVALUATION WAS PERFORMED  Claudette Laws E 02/13/2013 8:14 AM

## 2013-02-13 NOTE — Progress Notes (Signed)
Physical Therapy Session Note  Patient Details  Name: DONOLD MAROTTO MRN: [id number] Date of Birth: 1934-07-11  Today's Date: 02/13/2013 Time: 1100-1200 Time Calculation (min): 60 min  Short Term Goals: Week 1:  PT Short Term Goal 1 (Week 1): STGs=LTGs secondary to ELOS  Therapy Documentation Precautions:  Precautions Precautions: Fall Precaution Comments: phantom pain Restrictions Weight Bearing Restrictions: No Pain: generalized muscle soreness in Chest and UE's 5 to 6/10 with weight bearing through UE's  Therapeutic Activity:(30') Transfer Training sit<->stand into RW, donning L stump shrinker with waist band S/min-assist. Review of floor recovery and up steps on buttocks. W/C management:(15') propelling w/c using just UE's 2 x 250' Mod-I, and using B UE's and R LE 1 x 150' I/Mod-I Therapeutic Exercise(15')  R LE 5# ankle weight with LAQ and hip flexion, L LE lifts  See FIM for current functional status  Therapy/Group: Individual Therapy  Elya Tarquinio J 02/13/2013, 11:17 AM

## 2013-02-14 ENCOUNTER — Inpatient Hospital Stay (HOSPITAL_COMMUNITY): Payer: Medicare Other | Admitting: Occupational Therapy

## 2013-02-14 LAB — GLUCOSE, CAPILLARY
Glucose-Capillary: 87 mg/dL (ref 70–99)
Glucose-Capillary: 103 mg/dL — ABNORMAL HIGH (ref 70–99)
Glucose-Capillary: 137 mg/dL — ABNORMAL HIGH (ref 70–99)
Glucose-Capillary: 95 mg/dL (ref 70–99)

## 2013-02-14 LAB — PROTIME-INR: INR: 2.45 — ABNORMAL HIGH (ref 0.00–1.49)

## 2013-02-14 MED ORDER — WARFARIN SODIUM 2.5 MG PO TABS
2.5000 mg | ORAL_TABLET | Freq: Once | ORAL | Status: AC
  Administered 2013-02-14: 2.5 mg via ORAL
  Filled 2013-02-14: qty 1

## 2013-02-14 NOTE — Plan of Care (Signed)
Problem: RH PAIN MANAGEMENT Goal: RH STG PAIN MANAGED AT OR BELOW PT'S PAIN GOAL Pain level less than 3/10  Outcome: Not Progressing Pain lowest at 5 out of 10

## 2013-02-14 NOTE — Progress Notes (Signed)
ANTICOAGULATION CONSULT NOTE Pharmacy Consult for Coumadin Indication: atrial fibrillation  Allergies  Allergen Reactions  . Other Other (See Comments)    Plastic tape tears skin    Patient Measurements: Height: 5\' 8"  (172.7 cm) Weight: 166 lb 14.2 oz (75.7 kg) IBW/kg (Calculated) : 68.4    Vital Signs: Temp: 97.8 F (36.6 C) (12/14 0539) Temp src: Oral (12/14 0539) BP: 137/67 mmHg (12/14 0758) Pulse Rate: 74 (12/14 0758)  Labs:  Recent Labs  02/12/13 0655 02/13/13 0546 02/14/13 0546  LABPROT 18.9* 22.0* 25.8*  INR 1.63* 1.99* 2.45*    Estimated Creatinine Clearance: 33.6 ml/min (by C-G formula based on Cr of 1.81).   Assessment: [name] on coumadin PTA for atrial fibrillation. INR 2.45- now therapeutic. No new cbc, No bleeding noted per chart. He is also on lovenox 30 mg sq Q12hrs for VTE px until INR > 2.0 which it now is.  PTA dose: 2.5 mg daily EXCEPT for none on Thursday  Goal of Therapy:  INR 2-3  Plan:  1. warfarin 2.5 mg po x 1 tonight 2. Daily PT/INR 3. CBC tomorrow 4. D/C Lovenox  [name], PharmD, BCPS Clinical Pharmacist Pager: [phone] 02/14/2013 10:58 AM

## 2013-02-14 NOTE — Progress Notes (Signed)
Occupational Therapy Session Note  Patient Details  Name: [name] MRN: [id number] Date of Birth: 03-13-1934  Today's Date: 02/14/2013 Time: 1140-1210 Time Calculation (min): 30 min  Short Term Goals: Week 1:  OT Short Term Goal 1 (Week 1): STG=LTG  Skilled Therapeutic Interventions/Progress Updates:  Patient resting in bed upon arrival.  Engaged in self care tasks to include shower, dress and groom.  Patient transferred bed>w/c with Mod I and supervision for w/c><shower seat in roll in shower using grab bar.  Patient stands to bathe his buttocks and to pull up his pants using grab bar and/or sink for stability.  Patient inspected incision of left residual limb and stood at sink to groom.  Patient states he is discharging tomorrow and is very satisfied with his rehab so far.  Therapy Documentation Precautions:  Precautions Precautions: Fall Precaution Comments: phantom pain Restrictions Weight Bearing Restrictions: No Pain: 5/10 left residual limb, premedicated, rest repositioned. ADL: See FIM for current functional status  Therapy/Group: Individual Therapy  Tressie Ragin 02/14/2013, 12:13 PM

## 2013-02-14 NOTE — Progress Notes (Addendum)
Subjective/Complaints: Phantom pain a little better.  Staff awakens pt and then it starts up again. excited about D/C in am.  Mod I in room A 12 point review of systems has been performed and if not noted above is otherwise negative.   Objective: Vital Signs: Blood pressure 137/67, pulse 74, temperature 97.8 F (36.6 C), temperature source Oral, resp. rate 18, height 5\' 8"  (1.727 m), weight 75.7 kg (166 lb 14.2 oz), SpO2 97.00%. No results found. No results found for this basename: WBC, HGB, HCT, PLT,  in the last 72 hours No results found for this basename: NA, K, CL, CO, GLUCOSE, BUN, CREATININE, CALCIUM,  in the last 72 hours CBG (last 3)   Recent Labs  02/13/13 1622 02/13/13 2044 02/14/13 0727  GLUCAP 101* 212* 87    Wt Readings from Last 3 Encounters:  02/13/13 75.7 kg (166 lb 14.2 oz)  02/06/13 80.3 kg (177 lb 0.5 oz)  02/06/13 80.3 kg (177 lb 0.5 oz)    Physical Exam:  Constitutional: He is oriented to person, place, and time. He appears well-developed and well-nourished.  HENT:  Head: Normocephalic and atraumatic.  Eyes: Conjunctivae are normal. Pupils are equal, round, and reactive to light.  Neck: Neck supple.  Cardiovascular: Normal rate and regular rhythm. No rubs, gallops  No murmur heard.  Respiratory: Effort normal and breath sounds normal. No respiratory distress. He has no wheezes.  GI: Soft. Bowel sounds are normal. He exhibits no distension. There is no tenderness.  Musculoskeletal:  L-AKA with staples intact--clean, dry. Shaped well Neurological: He is alert and oriented to person, place, and time.  Follows commands. Strength grossly intact in UE's at 5/5. RLE is 4/5 prox to 5/5 distally. Left HF 2+/5. mild sensory loss in distal right leg/foot.  Skin: Intact. RLE and BUE with old well-healed incisions.  Psychiatric: He has a normal mood and affect. His speech is normal and behavior is normal. Judgment and thought content normal. Cognition and memory  are normal.      Assessment/Plan: 1. Functional deficits secondary to left AKA which require 3+ hours per day of interdisciplinary therapy in a comprehensive inpatient rehab setting. Physiatrist is providing close team supervision and 24 hour management of active medical problems listed below. Physiatrist and rehab team continue to assess barriers to discharge/monitor patient progress toward functional and medical goals. FIM: FIM - Bathing Bathing Steps Patient Completed: Chest;Right Arm;Left Arm;Abdomen;Front perineal area;Buttocks;Right upper leg;Left upper leg;Right lower leg (including foot) Bathing: 6: Assistive device (Comment)  FIM - Upper Body Dressing/Undressing Upper body dressing/undressing steps patient completed: Thread/unthread right sleeve of pullover shirt/dresss;Thread/unthread left sleeve of pullover shirt/dress;Put head through opening of pull over shirt/dress;Pull shirt over trunk Upper body dressing/undressing: 7: Complete Independence: No helper FIM - Lower Body Dressing/Undressing Lower body dressing/undressing steps patient completed: Thread/unthread right pants leg;Thread/unthread left pants leg;Pull pants up/down;Don/Doff right shoe Lower body dressing/undressing: 6: Assistive device (Comment)  FIM - Toileting Toileting steps completed by patient: Adjust clothing prior to toileting;Performs perineal hygiene;Adjust clothing after toileting Toileting: 6: Assistive device: No helper  FIM - Toilet Transfers Toilet Transfers: 5-To toilet/BSC: Supervision (verbal cues/safety issues);5-From toilet/BSC: Supervision (verbal cues/safety issues)  FIM - Press photographer Assistive Devices: Arm rests;Bed rails Bed/Chair Transfer: 6: Assistive device: no helper;7: Supine > Sit: No assist;7: Sit > Supine: No assist;6: Bed > Chair or W/C: No assist  FIM - Locomotion: Wheelchair Distance: >150 Locomotion: Wheelchair: 6: Travels 150 ft or more, turns  around, maneuvers to table,  bed or toilet, negotiates 3% grade: maneuvers on rugs and over door sills independently FIM - Locomotion: Ambulation Locomotion: Ambulation Assistive Devices: Walker - Rolling Ambulation/Gait Assistance: 4: Min assist Locomotion: Ambulation: 2: Travels 50 - 149 ft with minimal assistance (Pt.>75%)  Comprehension Comprehension Mode: Auditory Comprehension: 7-Follows complex conversation/direction: With no assist  Expression Expression Mode: Verbal Expression: 7-Expresses complex ideas: With no assist  Social Interaction Social Interaction: 7-Interacts appropriately with others - No medications needed.  Problem Solving Problem Solving: 7-Solves complex problems: Recognizes & self-corrects  Memory Memory: 7-Complete Independence: No helper Medical Problem List and Plan:  1. DVT Prophylaxis/Anticoagulation: Pharmaceutical: Coumadin resumed but still sub therapeutic--continue lovenox till INR > 2.0.  2. Pain Management:Change gabapentin for phantom limb pain 800mg  BID--Has reduced GFR would not increase beyong this. Minimize sleep disruption  Continue hs tramadol as well. 3. H/o depression/ Mood: seems to be handling things well at present. Team continues to provide egosupport 4. Neuropsych: This patient is capable of making decisions on his own behalf.  5. CAD with ICD/PPM: Monitor for any symptoms with increase in activity level. Continue coreg, lasix, cozaar and Lipitor.  6. DM type 2 with neuropathy: CM diet. Continue current 70/30 insulin. SSI---sugars stabilizing 7. A fib: Will monitor HR with bid checks. Continue coreg bid. Chronic coumadin resumed.  8. Chronic systolic CHF: Monitor weights qod. Low salt restrictions. Continue cozaar, lasix, coreg and lipitor.  9. BPH: Continue proscar. Monitor for voiding problems post op.  10. ABLA: Will monitor for now. Recheck hgb 9.9 11. CKD?: baseline BUN/Cr--40/2.0+ range per records.--labs consistent with that   12. Wound: stump sock and education per prosthetist  -will have orthotist deliver stump shrinker on monday  LOS (Days) 5 A FACE TO FACE EVALUATION WAS PERFORMED  Erick Colace 02/14/2013 8:17 AM

## 2013-02-15 ENCOUNTER — Encounter (HOSPITAL_COMMUNITY): Payer: Medicare Other | Admitting: Occupational Therapy

## 2013-02-15 ENCOUNTER — Inpatient Hospital Stay (HOSPITAL_COMMUNITY): Payer: Medicare Other | Admitting: *Deleted

## 2013-02-15 DIAGNOSIS — L98499 Non-pressure chronic ulcer of skin of other sites with unspecified severity: Secondary | ICD-10-CM

## 2013-02-15 DIAGNOSIS — I2510 Atherosclerotic heart disease of native coronary artery without angina pectoris: Secondary | ICD-10-CM

## 2013-02-15 DIAGNOSIS — E1165 Type 2 diabetes mellitus with hyperglycemia: Secondary | ICD-10-CM

## 2013-02-15 DIAGNOSIS — S78119A Complete traumatic amputation at level between unspecified hip and knee, initial encounter: Secondary | ICD-10-CM

## 2013-02-15 DIAGNOSIS — I739 Peripheral vascular disease, unspecified: Secondary | ICD-10-CM

## 2013-02-15 DIAGNOSIS — IMO0001 Reserved for inherently not codable concepts without codable children: Secondary | ICD-10-CM

## 2013-02-15 DIAGNOSIS — N289 Disorder of kidney and ureter, unspecified: Secondary | ICD-10-CM

## 2013-02-15 DIAGNOSIS — D62 Acute posthemorrhagic anemia: Secondary | ICD-10-CM

## 2013-02-15 LAB — CBC
HCT: 33.9 % — ABNORMAL LOW (ref 39.0–52.0)
MCH: 26.2 pg (ref 26.0–34.0)
MCHC: 31.9 g/dL (ref 30.0–36.0)
Platelets: 218 10*3/uL (ref 150–400)
RBC: 4.12 MIL/uL — ABNORMAL LOW (ref 4.22–5.81)
RDW: 14.9 % (ref 11.5–15.5)

## 2013-02-15 LAB — PROTIME-INR: Prothrombin Time: 25.4 seconds — ABNORMAL HIGH (ref 11.6–15.2)

## 2013-02-15 LAB — GLUCOSE, CAPILLARY

## 2013-02-15 MED ORDER — WARFARIN SODIUM 5 MG PO TABS: ORAL_TABLET | ORAL | Status: DC

## 2013-02-15 MED ORDER — DSS 100 MG PO CAPS: 100.0000 mg | ORAL_CAPSULE | Freq: Every day | ORAL | Status: DC

## 2013-02-15 MED ORDER — TRAMADOL HCL 50 MG PO TABS: 50.0000 mg | ORAL_TABLET | Freq: Every day | ORAL | Status: DC

## 2013-02-15 MED ORDER — INSULIN ASPART PROT & ASPART (70-30 MIX) 100 UNIT/ML ~~LOC~~ SUSP: 7.0000 [IU] | Freq: Two times a day (BID) | SUBCUTANEOUS | Status: DC

## 2013-02-15 MED ORDER — GABAPENTIN 400 MG PO CAPS: 800.0000 mg | ORAL_CAPSULE | Freq: Two times a day (BID) | ORAL | Status: DC

## 2013-02-15 MED ORDER — OXYCODONE-ACETAMINOPHEN 5-325 MG PO TABS: 1.0000 | ORAL_TABLET | Freq: Four times a day (QID) | ORAL | Status: DC | PRN

## 2013-02-15 MED ORDER — WARFARIN SODIUM 5 MG PO TABS
5.0000 mg | ORAL_TABLET | Freq: Once | ORAL | Status: DC
  Filled 2013-02-15: qty 1

## 2013-02-15 MED ORDER — POLYETHYLENE GLYCOL 3350 17 G PO PACK: 17.0000 g | PACK | Freq: Every day | ORAL | Status: DC

## 2013-02-15 NOTE — Progress Notes (Signed)
Pt. Got d/c orders,instructions and follow up appointments.pt. Ready to go home with family.

## 2013-02-15 NOTE — Progress Notes (Signed)
Occupational Therapy Discharge Summary  Patient Details  Name: [name] MRN: [id number] Date of Birth: 04-20-1934  Today's Date: 02/15/2013 Time: 0730-0755 Time Calculation (min): 25 min1:1 Continued d/c planning. Pt already performed B/D at mod I level this morning and declined shower (yesterday performed shower stall transfer). Discussed d/c to his home in [place] and his son house in [place] he will be going to over the holidays, DME.   Patient has met 8 of 8 long term goals due to improved activity tolerance, improved balance, postural control, ability to compensate for deficits and functional use of  RIGHT upper and LEFT upper extremity.  Patient to discharge at overall supervision for shower stall transfers; mod I with basic ADL level.  Patient's care partner is independent to provide the necessary physical assistance at discharge.    Reasons goals not met: n/a  Recommendation:  Pt does not require f/u OT at this time.   Equipment: RW, w/c  Reasons for discharge: treatment goals met and discharge from hospital  Patient/family agrees with progress made and goals achieved: Yes  OT Discharge Precautions/Restrictions  Precautions Precautions: Fall Precaution Comments: phantom pain Restrictions Weight Bearing Restrictions: No Pain  ongoing phantom pain  ADL  see FIM Vision/Perception  Vision - History Baseline Vision: Wears glasses only for reading Patient Visual Report: No change from baseline Vision - Assessment Eye Alignment: Within Functional Limits Vision Assessment: Vision not tested Perception Perception: Within Functional Limits Praxis Praxis: Intact  Cognition Overall Cognitive Status: Within Functional Limits for tasks assessed Arousal/Alertness: Awake/alert Orientation Level: Oriented X4 Memory: Appears intact Awareness: Appears intact Safety/Judgment: Appears intact Sensation Sensation Light Touch: Impaired Detail Light Touch Impaired Details:  Impaired LLE Coordination Gross Motor Movements are Fluid and Coordinated: Yes Fine Motor Movements are Fluid and Coordinated: Yes Motor  Motor Motor: Within Functional Limits Mobility  Transfers Transfers: Sit to Stand;Stand to Sit Sit to Stand: 6: Modified independent (Device/Increase time) Stand to Sit: 6: Modified independent (Device/Increase time)  Trunk/Postural Assessment  Cervical Assessment Cervical Assessment: Within Functional Limits Thoracic Assessment Thoracic Assessment: Within Functional Limits Lumbar Assessment Lumbar Assessment: Within Functional Limits Postural Control Postural Control: Within Functional Limits  Balance Static Sitting Balance Static Sitting - Level of Assistance: 6: Modified independent (Device/Increase time) Static Standing Balance Static Standing - Level of Assistance: 6: Modified independent (Device/Increase time) Dynamic Standing Balance Dynamic Standing - Level of Assistance: 6: Modified independent (Device/Increase time) Extremity/Trunk Assessment RUE Assessment RUE Assessment: Within Functional Limits LUE Assessment LUE Assessment: Within Functional Limits  See FIM for current functional status  [name] 02/15/2013, 8:06 AM

## 2013-02-15 NOTE — Discharge Summary (Signed)
Physician Discharge Summary  Patient ID: [name] MRN: [id number] DOB/AGE: [birth year] 76 y.o.  Admit date: 02/09/2013 Discharge date: 02/15/2013  Discharge Diagnoses:  Principal Problem:   Unilateral AKA Active Problems:   PERIPHERAL NEUROPATHY   HYPERTENSION   CORONARY ARTERY DISEASE   Atrial fibrillation   Diabetes   Acute blood loss anemia   Chronic kidney disease   Discharged Condition: Stable.   Significant Diagnostic Studies: No results found.  Labs:  Basic Metabolic Panel:  Recent Labs Lab 02/10/13 0605  NA 138  K 4.4  CL 105  CO2 24  GLUCOSE 104*  BUN 43*  CREATININE 1.81*  CALCIUM 8.5    CBC:  Recent Labs Lab 02/10/13 0605 02/15/13 0545  WBC 6.8 5.7  NEUTROABS 3.9  --   HGB 9.9* 10.8*  HCT 31.1* 33.9*  MCV 82.3 82.3  PLT 205 218    CBG:  Recent Labs Lab 02/14/13 0727 02/14/13 1146 02/14/13 1652 02/14/13 2042 02/15/13 0730  GLUCAP 87 103* 137* 95 96    Brief HPI:   [name] is a 78 y.o. right-handed male CAF--chronic Coumadin, CAD w/ICM and ICD/PPM, PVD with LLE ischemia and non healing wounds as well as DM type 2 with neuropathy. Patient with multiple attempts at revascularization and non reconstructable disease. He was admitted on 02/04/2013 for left AKA by Dr. Hart Rochester. Chronic Coumadin resumed post and Neurontin added for phantom pain. He is has had hypoglycemic episodes in the past 24 hours. Therapies initiated and CIR recommended for follow up.    Hospital Course: [name] was admitted to rehab 02/09/2013 for inpatient therapies to consist of PT, ST and OT at least three hours five days a week. Past admission physiatrist, therapy team and rehab RN have worked together to provide customized collaborative inpatient rehab. Wound has been healing well without signs or symtoms of infection. Retention stocking was ordered to help with edema control.  Blood sugars have been monitored on ac/hs basis and have been well  controlled on reduced dose insulin. Po intake has been good and he's continent of bowel and bladder. He continues to have phantom pain and Neurontin was increased to 800 mg bid but he reported tremors at time of discharge. Therefore Neurontin was decreased to 400 mg in am and 800 mg at bedtime. If tremors continue he is to call back for input regarding further dose adjustment.  Follow up lytes show CKD to be stable. Acute on chronic anemia is slowly improving.  Coumadin was resumed past surgery and is therapeutic at discharge.  Next protime to be drawn by [facility] on 02/18/13 with results to Dr. Abran Cantor.  He has made great progress and is independent at wheelchair level. Further Home Health therapies to continue past discharge.   Rehab course: During patient's stay in rehab weekly team conferences were held to monitor patient's progress, set goals and discuss barriers to discharge. Patient has had improved activity tolerance, improved balance, postural control as well as  ability to compensate for deficits. He is modified independent for basic ADL tasks. He requires supervision for shower stall transfers. He is independent for bed to chair transfers and is able to ambulate short distances with min assist. He requires min to moderate assist to navigate 3 steps. Family education was done with son who will provide assistance as needed past discharge.    Disposition:  Home   Diet: Diabetic diet.   Special Instructions: 1.  Cleanse wound with soap and water. Dennie Bible  dry and apply stocking daily.  Contact Dr. Hart Rochester if you note any purulent drainage, redness, fevers, chills or changes in incision. 2.  Check blood sugars twice a day. 3.  Continue to desensitize left residual limb.  4. [facility] RN to draw protime 02/18/13 with results to Dr. Abran Cantor.    Future Appointments Provider Department Dept Phone   03/08/2013 1:00 PM Nelwyn Salisbury, MD [facility] [phone]   03/09/2013 9:45 AM Pryor Ochoa, MD [facility] [phone]   03/18/2013 8:35 AM [facility] Office [phone]   03/24/2013 11:00 AM Ranelle Oyster, MD [facility] [phone]       Medication List    STOP taking these medications       enoxaparin 30 MG/0.3ML injection  Commonly known as:  LOVENOX     HYDROcodone-acetaminophen 5-325 MG per tablet  Commonly known as:  NORCO/VICODIN      TAKE these medications       atorvastatin 10 MG tablet  Commonly known as:  LIPITOR  Take 1 tablet (10 mg total) by mouth daily.     carvedilol 6.25 MG tablet  Commonly known as:  COREG  Take 1 tablet (6.25 mg total) by mouth 2 (two) times daily with a meal.     DSS 100 MG Caps  Take 100 mg by mouth daily. Stool softner--over there counter.     finasteride 5 MG tablet  Commonly known as:  PROSCAR  Take 5 mg by mouth daily.     furosemide 40 MG tablet  Commonly known as:  LASIX  Take 1 tablet (40 mg total) by mouth daily.     gabapentin 400 MG capsule  Commonly known as:  NEURONTIN  Take 1 capsules (400 mg total) in morning and two capsules (800 mg total) at bedtime.     insulin aspart protamine- aspart (70-30) 100 UNIT/ML injection  Commonly known as:  NOVOLOG MIX 70/30  Inject 0.07 mLs (7 Units total) into the skin 2 (two) times daily with a meal. 10 units with breakfast, and 10 units with the evening meal     losartan 100 MG tablet  Commonly known as:  COZAAR  Take 1 tablet (100 mg total) by mouth daily.     NITROSTAT 0.4 MG SL tablet  Generic drug:  nitroGLYCERIN  Place 0.4 mg under the tongue every 5 (five) minutes as needed for chest pain. For chest pain, max 3 doses     oxyCODONE-acetaminophen 5-325 MG per tablet Rx # 100 pills   Commonly known as:  PERCOCET/ROXICET  Take 1 tablet by mouth every 6 (six) hours as needed for severe pain.     polyethylene glycol packet  Commonly known as:   MIRALAX / GLYCOLAX  Take 17 g by mouth daily. For constipation--available over the counter in a bottle     traMADol 50 MG tablet  Commonly known as:  ULTRAM  Take 1 tablet (50 mg total) by mouth at bedtime. For neuropathy     warfarin 5 MG tablet  Commonly known as:  COUMADIN  Take 2.5 mg by mouth on Mon, Wed, Fri and 5 mg on all other days.       Follow-up Information   Follow up with Ranelle Oyster, MD On 03/24/2013. (Be there at 10:30 am  for  11 am appointment. )    Specialty:  Physical Medicine and Rehabilitation  Contact information:   [address], [address] [place] [address] [phone]       Follow up with Nelwyn Salisbury, MD On 03/08/2013. (@ 1:00 PM)    Specialty:  Family Medicine   Contact information:   [address] [place] [address] [phone]       Follow up with [name], MD On 03/09/2013. (Be there at 9:45 am for follow up appointment)    Specialty:  Vascular Surgery   Contact information:   [address] [place] [address] [phone]       Signed: Jacquelynn Cree 02/15/2013, 10:32 AM

## 2013-02-15 NOTE — Progress Notes (Signed)
Physical Therapy Discharge Summary  Patient Details  Name: [name] MRN: [id number] Date of Birth: [birth year]  Today's Date: 02/15/2013 Time: 0915-1000 Time Calculation (min): 45 min  Patient has met 12 of 12 long term goals due to improved activity tolerance, improved balance, increased strength, decreased pain, ability to compensate for deficits and improved coordination.  Patient to discharge at a household ambulatory level Modified Independent, community wheelchair level modified independent. Patient's care partner is independent to provide the necessary physical assistance at discharge. Patient's son has observed and participated in therapy sessions to better understand how patient has adapted to perform certain aspects of functional mobility and is aware of ways that he can assist patient, specifically with negotiation of stairs to enter his home in [place] (6 without handrail) and how to perform floor transfer.  Reasons goals not met: N/A, all LTGs met  Recommendation:  Patient will benefit from ongoing skilled PT services in outpatient setting to continue to advance safe functional mobility, address ongoing impairments in strength, balance, activity tolerance, gait, coordination, and minimize fall risk. Patient has been provided with handouts for home exercise program and HEP has been reviewed with patient. Patient instructed to perform this HEP in place of HHPT and to continue until he follows up with OPPT.  Equipment: 18x18 wheelchair and basic cushion  Reasons for discharge: treatment goals met and discharge from hospital  Patient/family agrees with progress made and goals achieved: Yes  PT Discharge Precautions/Restrictions Precautions Precautions: Fall Precaution Comments: phantom pain Restrictions Weight Bearing Restrictions: No Pain Pain Assessment Pain Assessment: 0-10 Pain Score: 6  Pain Type: Phantom pain Pain Location: Leg Pain Orientation: Left Pain  Descriptors / Indicators: Burning;Tingling Pain Onset: Gradual Pain Intervention(s): RN made aware;Ambulation/increased activity;Repositioned Multiple Pain Sites: No Vision/Perception  Vision - History Baseline Vision: Wears glasses only for reading Patient Visual Report: No change from baseline Vision - Assessment Eye Alignment: Within Functional Limits Vision Assessment: Vision not tested Perception Perception: Within Functional Limits Praxis Praxis: Intact  Cognition Overall Cognitive Status: Within Functional Limits for tasks assessed Arousal/Alertness: Awake/alert Orientation Level: Oriented X4 Memory: Appears intact Awareness: Appears intact Problem Solving: Appears intact Safety/Judgment: Appears intact Sensation Sensation Light Touch: Appears Intact Light Touch Impaired Details: Impaired LLE Proprioception: Appears Intact Coordination Gross Motor Movements are Fluid and Coordinated: Yes Fine Motor Movements are Fluid and Coordinated: Yes Motor  Motor Motor: Within Functional Limits  Mobility Bed Mobility Bed Mobility: Supine to Sit;Sit to Supine Supine to Sit: HOB flat;6: Modified independent (Device/Increase time) Sit to Supine: 6: Modified independent (Device/Increase time);HOB flat Transfers Transfers: Yes Sit to Stand: 6: Modified independent (Device/Increase time);With armrests;From bed;From chair/3-in-1 Stand to Sit: 6: Modified independent (Device/Increase time);To bed;To chair/3-in-1;With upper extremity assist;With armrests Squat Pivot Transfers: 6: Modified independent (Device/Increase time);With upper extremity assistance;With armrests Locomotion  Ambulation Ambulation: Yes Ambulation/Gait Assistance: 6: Modified independent (Device/Increase time) Ambulation Distance (Feet): 57 Feet Assistive device: Rolling walker Ambulation/Gait Assistance Details: Patient performed gait training 56' x1 in controlled environment and and 50'x1 in ADL apartment on  carpet (home environment) with RW and mod I. Gait Gait: Yes Gait Pattern: Impaired Gait Pattern: Decreased stride length;Step-to pattern Stairs / Additional Locomotion Stairs: Yes Stairs Assistance: 5: Supervision Stairs Assistance Details: Verbal cues for precautions/safety Stairs Assistance Details (indicate cue type and reason): Bumps up/down stairs on bottom. Stair Management Technique: Seated/boosting;No rails;One rail Right Number of Stairs: 12 (12 with R handrail and 6 without handrails) Height of Stairs: 7 Wheelchair Mobility Wheelchair Mobility: Yes  Wheelchair Assistance: 6: Modified independent (Device/Increase time) Occupational hygienist: Both upper extremities Wheelchair Parts Management: Independent Distance: >150  Trunk/Postural Assessment  Cervical Assessment Cervical Assessment: Within Functional Limits Thoracic Assessment Thoracic Assessment: Within Functional Limits Lumbar Assessment Lumbar Assessment: Within Functional Limits Postural Control Postural Control: Within Functional Limits  Balance Balance Balance Assessed: Yes Static Sitting Balance Static Sitting - Balance Support: No upper extremity supported;Feet supported (R LE supported) Static Sitting - Level of Assistance: 6: Modified independent (Device/Increase time) Static Standing Balance Static Standing - Balance Support: Right upper extremity supported;Left upper extremity supported;Bilateral upper extremity supported (unilateral or bilateral UE support) Static Standing - Level of Assistance: 6: Modified independent (Device/Increase time) Dynamic Standing Balance Dynamic Standing - Balance Support: Right upper extremity supported;Left upper extremity supported;Bilateral upper extremity supported (unilateral or bilateral UE support) Dynamic Standing - Level of Assistance: 6: Modified independent (Device/Increase time) Extremity Assessment  RUE Assessment RUE Assessment: Within Functional  Limits LUE Assessment LUE Assessment: Within Functional Limits RLE Assessment RLE Assessment: Within Functional Limits (Grossly 5/5) LLE Assessment LLE Assessment: Exceptions to [facility] LLE Strength LLE Overall Strength: Deficits;Due to pain LLE Overall Strength Comments: Formal MMT not performed secondary to pain. Patient with grossly 3/5 strength for hip flexion, abd, add.  See FIM for current functional status  Chipper Herb. Harris Penton, PT, DPT 02/15/2013, 10:12 AM

## 2013-02-15 NOTE — Progress Notes (Signed)
Coumadin per pharmacy  Pt with non healing wounds on leg admitted for AKA on 12/5. Was on coumadin PTA for a.fib. Coumadin per MD > coumadin per RX 12/9 since continue to be subtherapeutic on home dose, INR 2.4 no bleeding,   Goal INR 2-3  Anticoagulation: lovenox-warfarin bridge PTA dose: 2.5 mg daily EXCEPT for none on Thursday  INR  12/10 1.22 action: 5 mg 12/11 1.41 action: 5 mg 12/12 1.63 action: 5mg  12/13 1.99 action: 5mg  12/14 2.45 action: 2.5mg . D/c Lovenox 12/15 2.4 action: 5mg   Heme/onc-Hgb better at 10.8 and Plt 218 K  Plan:  -warfarin 5 mg po x1 tonight (will probably need 5 mg few days a week and 2.5mg  the rest of the week) -daily INR

## 2013-02-15 NOTE — Patient Care Conference (Signed)
Inpatient RehabilitationTeam Conference and Plan of Care Update            Date: 02/12/2013   Time:12:00 PM  Patient Name: [name]      Medical Record Number: [id number]  Date of Birth: 1934-11-07 Sex: Male         Room/Bed: 4W24C/4W24C-01 Payor Info: Payor: [employer] / Plan: BLUE MEDICARE / Product Type: *No Product type* /    Admitting Diagnosis: L AKA  Admit Date/Time:  02/09/2013  6:10 PM Admission Comments: No comment available   Primary Diagnosis:  Unilateral AKA Principal Problem: Unilateral AKA  Patient Active Problem List   Diagnosis Date Noted  . Acute blood loss anemia 02/10/2013  . Chronic kidney disease 02/10/2013  . Unilateral AKA 02/09/2013  . Nontraumatic ischemic infarction of muscle of lower leg 02/04/2013  . Long term (current) use of anticoagulants 02/01/2013  . Pain, limb, left-Leg 01/21/2013  . Aftercare following surgery of the circulatory system, NEC 01/12/2013  . PVD (peripheral vascular disease) 12/22/2012  . Swelling of limb 10/07/2012  . Subclavian artery stenosis 09/08/2012  . Peripheral vascular disease, unspecified 08/18/2012  . Fever 07/08/2012  . Leg edema 06/02/2012  . Cardiomyopathy, ischemic 05/18/2012  . S/P ICD (internal cardiac defibrillator) procedure 05/18/2012  . Diabetes 05/18/2012  . Pain in limb 02/07/2012  . Atherosclerosis of native arteries of the extremities with intermittent claudication 12/17/2011  . Atherosclerotic PVD with intermittent claudication 10/15/2011  . Type I (juvenile type) diabetes mellitus with peripheral circulatory disorders, uncontrolled 09/06/2011  . PAD (peripheral artery disease) 09/04/2011  . CAD (coronary artery disease)   . Burning sensation of feet 05/10/2011  . Bursitis of right hip 05/10/2011  . Cervicalgia 05/10/2011  . Balance disorder 05/10/2011  . Neurogenic claudication 05/10/2011  . Lumbago 05/10/2011  . Chronic systolic congestive heart failure  07/06/2010  . ECZEMA 01/17/2010  . RENAL INSUFFICIENCY 11/01/2009  . UTI 11/01/2009  . CELLULITIS, LEG, RIGHT 08/22/2009  . LYMPHADENOPATHY, REACTIVE 08/22/2009  . PERIPHERAL NEUROPATHY 01/24/2009  . DIZZINESS 12/29/2008  . ABDOMINAL PAIN, GENERALIZED 08/22/2008  . GOUT 09/18/2007  . CORONARY ARTERY DISEASE 09/18/2007  . HYPERLIPIDEMIA 01/23/2007  . HYPERTENSION 01/23/2007  . Atrial fibrillation 01/23/2007  . BENIGN PROSTATIC HYPERTROPHY 01/23/2007  . LATERAL EPICONDYLITIS 01/23/2007    Expected Discharge Date: Expected Discharge Date: 02/15/13  Team Members Present: Physician leading conference: Dr. Faith Rogue Social Worker Present: Staci Acosta, LCSW Nurse Present: Other (comment) Tennis Must, RN) PT Present: Cyndia Skeeters, PT OT Present: [name], OT     Current Status/Progress Goal Weekly Team Focus  Medical   phantom limb pain after aka. pain improving. wound clean  pain conrtol. education  see above, limb protection   Bowel/Bladder   Continent to bowel and bladder.  To continue continent to bladder and bowel  Assess for S&S of constipation or retention Q. shift.   Swallow/Nutrition/ Hydration             ADL's   supervision for ADL with RW   overall mod I   community outting, transfers, standing balance, amputation care, activity tolerance, d/c planning   Mobility   mod I at wheelchair level; S wheelchair propulsion, minA gait x60', minA x3 stairs with B handrails  mod I  safety, wheelchair mobility, transfers, bed mobility, gait, stairs, balance, floor transfers, strengthening, activity tolerance   Communication             Safety/Cognition/ Behavioral Observations  Pain   Verbalizing phanton pain,and requesting PRN pain medicine # to 4 times a day.  To keep pain level 2 on scale 1-10  To assess Pt.'s pain Q.2 hrs.And to medicated Pt. as need it.   Skin   Incision on left stump with staples,dry and intact  To keep skin dry and clean  with out pressure sores  To assess skin Q.shift.    Rehab Goals Patient on target to meet rehab goals: Yes Rehab Goals Revised: Pt to reach modified independent level by d/c date.  He will need supervision with car transfers, tub transfers, stairs, and meal preparation *See Care Plan and progress notes for long and short-term goals.  Barriers to Discharge: none    Possible Resolutions to Barriers:  none    Discharge Planning/Teaching Needs:  Pt is going home with family to [place].  He will then return to his home in the beginning of the new year.  Family has already widen doorways and prepared pt's home for his return.   Pt will be independent.  Family is here if we need to teach them anything.   Team Discussion:  Pt is ready for d/c after therapies on 02-15-13.  He is meeting modified independent goals and is medically stable for d/c.    Revisions to Treatment Plan:  None   Continued Need for Acute Rehabilitation Level of Care: The patient requires daily medical management by a physician with specialized training in physical medicine and rehabilitation for the following conditions: Daily direction of a multidisciplinary physical rehabilitation program to ensure safe treatment while eliciting the highest outcome that is of practical value to the patient.: Yes Daily analysis of laboratory values and/or radiology reports with any subsequent need for medication adjustment of medical intervention for : Post surgical problems;Other (pain)  Fountain Derusha, [place] 02/15/2013, 9:13 AM

## 2013-02-15 NOTE — Progress Notes (Signed)
Social Work Discharge Note  The overall goal for the admission was met for:   Discharge location:  Yes - home  Length of Stay: Yes - 6 days  Discharge activity level: Yes - modified independent  Home/community participation: Yes  Services provided included: MD, RD, PT, OT, RN, Pharmacy and SW  Financial Services: Medicare and Private Insurance: [employer] as a secondary  Follow-up services arranged: Home Health: PT and RN, DME: 18 x 18 lightweight w/c with basic cushion, Other: Mobile Meals & SCAT and Patient/Family request agency HH: [facility], DME: [facility]  Comments (or additional information):  Patient/Family verbalized understanding of follow-up arrangements: Yes  Individual responsible for coordination of the follow-up plan: pt with support from family and friends  Confirmed correct DME delivered: Elvera Lennox 02/15/2013    Mallori Araque, [place]

## 2013-02-15 NOTE — Progress Notes (Signed)
Social Work Patient ID: [name], male   DOB: 10/14/1934, 78 y.o.   MRN: [id number]  Team met for informal team conference because pt was meeting goals prior to formal team conference date.  It was determined that pt could be d/c'd on 02-15-13.  CSW informed pt and family and they were pleased with d/c date.  CSW will make necessary arrangements per therapists' recommendations and pt's preferences.

## 2013-02-15 NOTE — Progress Notes (Signed)
Subjective/Complaints: Phantom pain a little better but still bothers him more at night.  Has tried Lyrica in past and no better than Gabapentin. excited about D/C .  Mod I in room A 12 point review of systems has been performed and if not noted above is otherwise negative.   Objective: Vital Signs: Blood pressure 151/73, pulse 77, temperature 97.8 F (36.6 C), temperature source Oral, resp. rate 18, height 5\' 8"  (1.727 m), weight 75.7 kg (166 lb 14.2 oz), SpO2 100.00%. No results found.  Recent Labs  02/15/13 0545  WBC 5.7  HGB 10.8*  HCT 33.9*  PLT 218   No results found for this basename: NA, K, CL, CO, GLUCOSE, BUN, CREATININE, CALCIUM,  in the last 72 hours CBG (last 3)   Recent Labs  02/14/13 1652 02/14/13 2042 02/15/13 0730  GLUCAP 137* 95 96    Wt Readings from Last 3 Encounters:  02/13/13 75.7 kg (166 lb 14.2 oz)  02/06/13 80.3 kg (177 lb 0.5 oz)  02/06/13 80.3 kg (177 lb 0.5 oz)    Physical Exam:  Constitutional: He is oriented to person, place, and time. He appears well-developed and well-nourished.  HENT:  Head: Normocephalic and atraumatic.  Eyes: Conjunctivae are normal. Pupils are equal, round, and reactive to light.  Neck: Neck supple.  Cardiovascular: Normal rate and regular rhythm. No rubs, gallops  No murmur heard.  Respiratory: Effort normal and breath sounds normal. No respiratory distress. He has no wheezes.  GI: Soft. Bowel sounds are normal. He exhibits no distension. There is no tenderness.  Musculoskeletal:  L-AKA with staples intact--clean, dry. Shaped well Neurological: He is alert and oriented to person, place, and time.  Follows commands. Strength grossly intact in UE's at 5/5. RLE is 4/5 prox to 5/5 distally. Left HF 2+/5. mild sensory loss in distal right leg/foot.  Skin: Intact.incision healing well Psychiatric: He has a normal mood and affect. His speech is normal and behavior is normal. Judgment and thought content normal.  Cognition and memory are normal.      Assessment/Plan: 1. Functional deficits secondary to left AKA Stable for D/C today F/u PCP in 1-2 weeks F/u PM&R 3 weeks F/U surgery for post op check See D/C summary See D/C instructions                                                                                                                                                                 FIM: FIM - Bathing Bathing Steps Patient Completed: Chest;Right Arm;Left Arm;Abdomen;Front perineal area;Buttocks;Right upper leg;Left upper leg;Right lower leg (including foot) Bathing: 6: More than reasonable amount of time  FIM - Upper Body Dressing/Undressing Upper body dressing/undressing steps patient completed: Thread/unthread right sleeve of pullover shirt/dresss;Thread/unthread left sleeve of pullover shirt/dress;Put head through  opening of pull over shirt/dress;Pull shirt over trunk Upper body dressing/undressing: 7: Complete Independence: No helper FIM - Lower Body Dressing/Undressing Lower body dressing/undressing steps patient completed: Thread/unthread right underwear leg;Thread/unthread left underwear leg;Pull underwear up/down;Thread/unthread right pants leg;Thread/unthread left pants leg;Pull pants up/down;Don/Doff right shoe Lower body dressing/undressing: 6: More than reasonable amount of time  FIM - Toileting Toileting steps completed by patient: Adjust clothing prior to toileting;Performs perineal hygiene;Adjust clothing after toileting Toileting: 6: More than reasonable amount of time  FIM - Diplomatic Services operational officer Devices: Grab bars Toilet Transfers: 6-More than reasonable amt of time  FIM - Banker Devices: Arm rests;Bed rails Bed/Chair Transfer: 7: Independent: No helper;6: More than reasonable amt of time  FIM - Locomotion: Wheelchair Distance: >150 Locomotion: Wheelchair: 6: Travels 150 ft or more, turns around,  maneuvers to table, bed or toilet, negotiates 3% grade: maneuvers on rugs and over door sills independently FIM - Locomotion: Ambulation Locomotion: Ambulation Assistive Devices: Designer, industrial/product Ambulation/Gait Assistance: 4: Min assist Locomotion: Ambulation: 2: Travels 50 - 149 ft with minimal assistance (Pt.>75%)  Comprehension Comprehension Mode: Auditory Comprehension: 7-Follows complex conversation/direction: With no assist  Expression Expression Mode: Verbal Expression: 7-Expresses complex ideas: With no assist  Social Interaction Social Interaction: 7-Interacts appropriately with others - No medications needed.  Problem Solving Problem Solving: 7-Solves complex problems: Recognizes & self-corrects  Memory Memory: 7-Complete Independence: No helper Medical Problem List and Plan:  1. DVT Prophylaxis/Anticoagulation: Pharmaceutical: Coumadin resumed but still sub therapeutic--continue lovenox till INR > 2.0.  2. Pain Management:Change gabapentin for phantom limb pain 800mg  BID--Has reduced GFR would not increase beyong this. Minimize sleep disruption  Continue hs tramadol as well. 3. H/o depression/ Mood: seems to be handling things well at present. Team continues to provide egosupport 4. Neuropsych: This patient is capable of making decisions on his own behalf.  5. CAD with ICD/PPM: Monitor for any symptoms with increase in activity level. Continue coreg, lasix, cozaar and Lipitor.  6. DM type 2 with neuropathy: CM diet. Continue current 70/30 insulin. SSI---sugars stabilizing 7. A fib: Will monitor HR with bid checks. Continue coreg bid. Chronic coumadin resumed.  8. Chronic systolic CHF: Monitor weights qod. Low salt restrictions. Continue cozaar, lasix, coreg and lipitor.  9. BPH: Continue proscar. Monitor for voiding problems post op.  10. ABLA: Will monitor for now. Recheck hgb 9.9 11. CKD?: baseline BUN/Cr--40/2.0+ range per records.--labs consistent with that    12.  Wound: stump sock and education per prosthetist  -will have orthotist deliver stump shrinker on monday  LOS (Days) 6 A FACE TO FACE EVALUATION WAS PERFORMED  Erick Colace 02/15/2013 8:51 AM

## 2013-02-15 NOTE — Progress Notes (Signed)
Social Work Assessment and Plan  Patient Details  Name: [name] MRN: [id number] Date of Birth: 09/27/1934  Today's Date: 02/15/2013  Problem List:  Patient Active Problem List   Diagnosis Date Noted  . Acute blood loss anemia 02/10/2013  . Chronic kidney disease 02/10/2013  . Unilateral AKA 02/09/2013  . Nontraumatic ischemic infarction of muscle of lower leg 02/04/2013  . Long term (current) use of anticoagulants 02/01/2013  . Pain, limb, left-Leg 01/21/2013  . Aftercare following surgery of the circulatory system, NEC 01/12/2013  . PVD (peripheral vascular disease) 12/22/2012  . Swelling of limb 10/07/2012  . Subclavian artery stenosis 09/08/2012  . Peripheral vascular disease, unspecified 08/18/2012  . Fever 07/08/2012  . Leg edema 06/02/2012  . Cardiomyopathy, ischemic 05/18/2012  . S/P ICD (internal cardiac defibrillator) procedure 05/18/2012  . Diabetes 05/18/2012  . Pain in limb 02/07/2012  . Atherosclerosis of native arteries of the extremities with intermittent claudication 12/17/2011  . Atherosclerotic PVD with intermittent claudication 10/15/2011  . Type I (juvenile type) diabetes mellitus with peripheral circulatory disorders, uncontrolled 09/06/2011  . PAD (peripheral artery disease) 09/04/2011  . CAD (coronary artery disease)   . Burning sensation of feet 05/10/2011  . Bursitis of right hip 05/10/2011  . Cervicalgia 05/10/2011  . Balance disorder 05/10/2011  . Neurogenic claudication 05/10/2011  . Lumbago 05/10/2011  . Chronic systolic congestive heart failure 07/06/2010  . ECZEMA 01/17/2010  . RENAL INSUFFICIENCY 11/01/2009  . UTI 11/01/2009  . CELLULITIS, LEG, RIGHT 08/22/2009  . LYMPHADENOPATHY, REACTIVE 08/22/2009  . PERIPHERAL NEUROPATHY 01/24/2009  . DIZZINESS 12/29/2008  . ABDOMINAL PAIN, GENERALIZED 08/22/2008  . GOUT 09/18/2007  . CORONARY ARTERY DISEASE 09/18/2007  . HYPERLIPIDEMIA 01/23/2007  . HYPERTENSION 01/23/2007  . Atrial  fibrillation 01/23/2007  . BENIGN PROSTATIC HYPERTROPHY 01/23/2007  . LATERAL EPICONDYLITIS 01/23/2007   Past Medical History:  Past Medical History  Diagnosis Date  . Hyperlipidemia   . Gout   . Benign prostatic hypertrophy   . Atrial fibrillation     sees Dr. Verdis Prime  . Chronic systolic dysfunction of left ventricle     a. mixed ischemic and nonischemic CM,  EF 35%. b. s/p AICD implantation.  . CHF (congestive heart failure)   . ED (erectile dysfunction)   . Arthritis   . Pacemaker     medtronic  . CAD (coronary artery disease)     a. s/p mid LAD stenting with DES 2008 Dr. Verdis Prime  . ICD (implantable cardiac defibrillator) in place     medtronic, Dr. Johney Frame  . ICD (implantable cardiac defibrillator) in place     due for check in Feb/2013  . Sleep apnea     hx of "had surgery for"  . Hypertension     sees Dr. Claris Che  . Type II diabetes mellitus   . Automatic implantable cardioverter-defibrillator in situ   . Depression   . Numbness and tingling     Hx; 73f left foot  . PAD (peripheral artery disease)     Severe by PV angiogram 09/2011  . Renal artery stenosis     s/p stenting 2009  . PAD (peripheral artery disease)     s/p multiple LLE bypass grafts; left mid-distal SCA occlusion by 08/2012 duplex   Past Surgical History:  Past Surgical History  Procedure Laterality Date  . Turp vaporization    . Cardiac defibrillator placement  12/26/09    pacemaker combo  . Cervical epidural injection  2013  . Femoral-tibial  bypass graft  09/25/2011    Procedure: BYPASS GRAFT FEMORAL-TIBIAL ARTERY;  Surgeon: Pryor Ochoa, MD;  Location: [facility] OR;  Service: Vascular;  Laterality: Left;  Left Femoral - Anterior Tibial Bypass;  saphenous vein graft from left leg  . Intraoperative arteriogram  09/25/2011    Procedure: INTRA OPERATIVE ARTERIOGRAM;  Surgeon: Pryor Ochoa, MD;  Location: [facility] OR;  Service: Vascular;  Laterality: Left;  . Femoral-tibial bypass graft  02/07/2012     Procedure: BYPASS GRAFT FEMORAL-TIBIAL ARTERY;  Surgeon: Larina Earthly, MD;  Location: [facility] OR;  Service: Vascular;  Laterality: Left;  Thrombectomy Left Femoral - Tibial Bypass Graft  . Embolectomy  02/07/2012    Procedure: EMBOLECTOMY;  Surgeon: Larina Earthly, MD;  Location: [facility] OR;  Service: Vascular;  Laterality: Left;  . Femoral-tibial bypass graft  04/03/2012    Procedure: BYPASS GRAFT FEMORAL-TIBIAL ARTERY;  Surgeon: Pryor Ochoa, MD;  Location: [facility] OR;  Service: Vascular;  Laterality: Left;  Redo  . Insert / replace / remove pacemaker  2007  . Coronary angioplasty with stent placement  ~ 2000  . Coronary angioplasty    . Uvulopalatopharyngoplasty (uppp)/tonsillectomy/septoplasty  06/30/2003    [name] 06/30/2003 (07/10/2012)  . Shoulder open rotator cuff repair Right 2001    repair of lacerated right/notes 10/11/1999  (07/10/2012)  . Foot surgery Right 03/20/2001    "have plates and screws in; didn't break it" (07/10/2012)  . Carpal tunnel release Right 2002    [name] 03/20/2001 (07/10/2012)  . Biceps tendon repair Right 2001    [name] 03/20/2001 (07/10/2012)  . Renal artery stent  2009  . Femoral-popliteal bypass graft Left 09/16/2012    Procedure: LEFT FEMORAL-POPLITEAL BYPASS GRAFT WITH GORTEX Propaten Graft 6x80 Thin Wall and Left lower leg Angiogram;  Surgeon: Pryor Ochoa, MD;  Location: [facility] OR;  Service: Vascular;  Laterality: Left;  . Colonoscopy      Hx; of  . Tonsillectomy    . Adenoidectomy      Hx; of   . Femoral-tibial bypass graft Left 09/30/2012    Procedure: REDO LEFT FEMORAL-ANTERIOR TIBIAL ARTERY BYPASS USING COMPOSITE CEPHALIC AND BASILIC VEIN GRAFT FROM LEFT ARM;  Surgeon: Pryor Ochoa, MD;  Location: [facility] OR;  Service: Vascular;  Laterality: Left;  . I&d extremity Left 10/21/2012    Procedure: EXPLORATION AND DEBRIDEMENT OF LEFT GROIN WOUND;  Surgeon: Pryor Ochoa, MD;  Location: [facility] OR;  Service: Vascular;  Laterality: Left;  . Patch angioplasty Left 10/21/2012    Procedure: PATCH  ANGIOPLASTY;  Surgeon: Pryor Ochoa, MD;  Location: [facility] OR;  Service: Vascular;  Laterality: Left;  . Groin debridement Left 11/12/2012    Procedure: CLOSURE INGUINAL WOUND;  Surgeon: Pryor Ochoa, MD;  Location: [facility] OR;  Service: Vascular;  Laterality: Left;  . Embolectomy Left 12/16/2012    Procedure: THROMBECTOMY  LEFT LEG BYPASS;  Surgeon: Sherren Kerns, MD;  Location: [facility] OR;  Service: Vascular;  Laterality: Left;  . Leg amputation above knee Left 02/04/2013    DR LAWSON  . Amputation Left 02/04/2013    Procedure: AMPUTATION ABOVE KNEE-LEFT;  Surgeon: Pryor Ochoa, MD;  Location[facility] OR;  Service: Vascular;  Laterality: Left;   Social History:  reports that he has never smoked. He has never used smokeless tobacco. He reports that he drinks about 2.4 ounces of alcohol per week. He reports that he does not use illicit drugs.  Family / Support Systems Marital Status: Widow/Widower How Long?:  15 years this month Patient Roles: Parent;Other (Comment) (brother; employee) Children: Courtez Twaddle - son [phone]; Hoby Kawai - dtr [phone]; Patricia Perales - brother [phone] Other Supports: friends and neighbors Anticipated Caregiver: self and friends/neighbors Ability/Limitations of Caregiver: Family members live out of town. 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  Pt also coordinates local golf tournaments and wants to continue to do that. Legal Hisotry/Current Legal Issues: None Guardian/Conservator: None   Abuse/Neglect Physical Abuse: Denies Verbal Abuse: Denies Sexual Abuse: Denies Exploitation of  patient/patient's resources: Denies Self-Neglect: Denies  Emotional Status Pt's affect, behavior and adjustment status: Pt is motivated and ready to get back to doing what he likes to do.  He is struggling a bit with Recent Psychosocial Issues: Pt is used to being independent and is having trouble having others to help him. Pyschiatric History: None Substance Abuse History: None  Patient / Family Perceptions, Expectations & Goals Pt/Family understanding of illness & functional limitations: Pt understands his condition and feels his questions have been answered. Premorbid pt/family roles/activities: Pt delivered food and coordinated area wide golf tournaments.  He prepares his own meals and keeps up his townhome.  Pt enjoys playing golf. Anticipated changes in roles/activities/participation: Pt wants to get back to doing the things he was doing in the past as soon as possible. Pt/family expectations/goals: "To get out of here quickly and to be more self sufficient"  [employer]: None Premorbid Home Care/DME Agencies: None (has used [facility] in the past) Transportation available at discharge: son Resource referrals recommended: Support group (specify)  Discharge Planning Living Arrangements: Alone Support Systems: Children;Friends/neighbors Type of Residence: Private residence Insurance Resources: Administrator (specify) Building services engineer) Financial Resources: Employment;Social Security Financial Screen Referred: No Living Expenses: Own Money Management: Patient Does the patient have any problems obtaining your medications?: No Home Management: Pt was taking care of everything for himself. Patient/Family Preliminary Plans: Pt plans to be at his home this week with his son.  Then, he will go to [place] for the week of Christmas to be with family.  Pt will be back home by week of December 29th. Barriers to Discharge: Steps (steps at family's home.  None  at his.) Social Work Anticipated Follow Up Needs: HH/OP Expected length of stay: about a week  Clinical Impression CSW met with pt while his brother and sister-in-law were visiting in the room.  Pt was willing to have them stay for assessment.  Pt is used to being very independent and is having a difficult time with people helping him.  He feels he is progressing in his rehab and is looking forward to going home.  Pt states he has pleased with the therapies and he feels comfortable to manage his care at home.  Pt's family is concerned about pt living alone, however pt feels confident he can care for himself and he has friends who will help him get to appointments.  CSW arranged home health for RN and PT to begin this week and will resume when he returns from [place].  RN to draw protime on 02-18-13.  Gave family information on [employer], [employer], [employer], and Amputee Support Group.  Pt has a third insurance through [employer]  which seems like longterm care insurance.  Encouraged pt's dtr-in-law to call the company with pt present so they can get more information.  It's likely that pt will not need that care at this time, due to his good condition, so pt/family may elect to wait to use that benefit at a later time, if pt has another medical event.  Told family that the policy may have a waiting period, as well.  Pt's f/u MD appointments have been scheduled.  Pt is eager to go home and does not have any other concerns.  Davis Ambrosini, [place] 02/15/2013, 11:22 AM

## 2013-02-17 ENCOUNTER — Telehealth: Payer: Self-pay

## 2013-02-17 NOTE — Progress Notes (Signed)
Patient called to state that his pills got thrown out during the process of packing and getting things done. He has meds that are in his pill box--oxycodone #12 and ultram # 15. Informed him that narcotics have strict guidelines for follow up. Dicussed with Dr. Riley Kill who's willing to give patient benefit of doubt---Rx written for percocet 5/325 # 90 pills. Son to pick up.

## 2013-02-17 NOTE — Telephone Encounter (Signed)
Rinaldo Cloud, [place] called to advise pt's INR upon discharge is 2.4. His current dose is 2.5mg  MWF and 5 all other days. She states that home health will be going to see pt on Thursday and will call office with results. Pt will need instructions asap as he is planning to go to [place] this weekend

## 2013-02-18 ENCOUNTER — Ambulatory Visit (INDEPENDENT_AMBULATORY_CARE_PROVIDER_SITE_OTHER): Payer: Medicare Other | Admitting: General Practice

## 2013-02-18 ENCOUNTER — Telehealth: Payer: Self-pay | Admitting: Family Medicine

## 2013-02-18 DIAGNOSIS — Z7901 Long term (current) use of anticoagulants: Secondary | ICD-10-CM

## 2013-02-18 NOTE — Progress Notes (Signed)
Pre-visit discussion using our clinic review tool. No additional management support is needed unless otherwise documented below in the visit note.  

## 2013-02-18 NOTE — Telephone Encounter (Signed)
I& r  Results   2.8 Pt  35 Taking 2.5 daily e/c for thurs, not at all on thurs

## 2013-02-21 ENCOUNTER — Emergency Department (HOSPITAL_COMMUNITY)
Admission: EM | Admit: 2013-02-21 | Discharge: 2013-02-21 | Disposition: A | Discharge status: Home / Self Care | Payer: Medicare Other | Attending: Emergency Medicine | Admitting: Emergency Medicine

## 2013-02-21 ENCOUNTER — Emergency Department (HOSPITAL_COMMUNITY): Payer: Medicare Other

## 2013-02-21 ENCOUNTER — Encounter (HOSPITAL_COMMUNITY): Payer: Self-pay | Admitting: Emergency Medicine

## 2013-02-21 DIAGNOSIS — Y939 Activity, unspecified: Secondary | ICD-10-CM | POA: Insufficient documentation

## 2013-02-21 DIAGNOSIS — Z794 Long term (current) use of insulin: Secondary | ICD-10-CM | POA: Insufficient documentation

## 2013-02-21 DIAGNOSIS — S7010XA Contusion of unspecified thigh, initial encounter: Secondary | ICD-10-CM | POA: Insufficient documentation

## 2013-02-21 DIAGNOSIS — I4891 Unspecified atrial fibrillation: Secondary | ICD-10-CM | POA: Insufficient documentation

## 2013-02-21 DIAGNOSIS — Z9861 Coronary angioplasty status: Secondary | ICD-10-CM | POA: Insufficient documentation

## 2013-02-21 DIAGNOSIS — Y929 Unspecified place or not applicable: Secondary | ICD-10-CM | POA: Insufficient documentation

## 2013-02-21 DIAGNOSIS — R296 Repeated falls: Secondary | ICD-10-CM | POA: Insufficient documentation

## 2013-02-21 DIAGNOSIS — M129 Arthropathy, unspecified: Secondary | ICD-10-CM | POA: Insufficient documentation

## 2013-02-21 DIAGNOSIS — E785 Hyperlipidemia, unspecified: Secondary | ICD-10-CM | POA: Insufficient documentation

## 2013-02-21 DIAGNOSIS — I509 Heart failure, unspecified: Secondary | ICD-10-CM | POA: Insufficient documentation

## 2013-02-21 DIAGNOSIS — S7012XA Contusion of left thigh, initial encounter: Secondary | ICD-10-CM

## 2013-02-21 DIAGNOSIS — Z79899 Other long term (current) drug therapy: Secondary | ICD-10-CM | POA: Insufficient documentation

## 2013-02-21 DIAGNOSIS — E119 Type 2 diabetes mellitus without complications: Secondary | ICD-10-CM | POA: Insufficient documentation

## 2013-02-21 DIAGNOSIS — F329 Major depressive disorder, single episode, unspecified: Secondary | ICD-10-CM | POA: Insufficient documentation

## 2013-02-21 DIAGNOSIS — N400 Enlarged prostate without lower urinary tract symptoms: Secondary | ICD-10-CM | POA: Insufficient documentation

## 2013-02-21 DIAGNOSIS — Z95810 Presence of automatic (implantable) cardiac defibrillator: Secondary | ICD-10-CM | POA: Insufficient documentation

## 2013-02-21 DIAGNOSIS — I2510 Atherosclerotic heart disease of native coronary artery without angina pectoris: Secondary | ICD-10-CM | POA: Insufficient documentation

## 2013-02-21 DIAGNOSIS — I10 Essential (primary) hypertension: Secondary | ICD-10-CM | POA: Insufficient documentation

## 2013-02-21 DIAGNOSIS — Z7902 Long term (current) use of antithrombotics/antiplatelets: Secondary | ICD-10-CM | POA: Insufficient documentation

## 2013-02-21 DIAGNOSIS — Z950 Presence of cardiac pacemaker: Secondary | ICD-10-CM | POA: Insufficient documentation

## 2013-02-21 DIAGNOSIS — F3289 Other specified depressive episodes: Secondary | ICD-10-CM | POA: Insufficient documentation

## 2013-02-21 MED ORDER — KETOROLAC TROMETHAMINE 60 MG/2ML IM SOLN
30.0000 mg | Freq: Once | INTRAMUSCULAR | Status: AC
  Administered 2013-02-21: 30 mg via INTRAMUSCULAR
  Filled 2013-02-21: qty 2

## 2013-02-21 MED ORDER — HYDROMORPHONE HCL PF 1 MG/ML IJ SOLN
1.0000 mg | Freq: Once | INTRAMUSCULAR | Status: AC
  Administered 2013-02-21: 1 mg via INTRAMUSCULAR
  Filled 2013-02-21: qty 1

## 2013-02-21 NOTE — ED Notes (Signed)
Patient transported to X-ray 

## 2013-02-21 NOTE — ED Notes (Addendum)
Pt had recent left ABK amputation on 12/4. Pt states he was up at 0400 and fell on amputated leg. Now reports pain in leg and expresses concerns due to recent surgery. Denies any other symptoms. Denies any pain before the fall.

## 2013-02-21 NOTE — ED Provider Notes (Signed)
CSN: [id number]     Arrival date & time 02/21/13  4098 History   First MD Initiated Contact with Patient 02/21/13 0800     Chief Complaint  Patient presents with  . Leg Pain   (Consider location/radiation/quality/duration/timing/severity/associated sxs/prior Treatment) Patient is a 78 y.o. Campos presenting with leg pain. The history is provided by the patient.  Leg Pain Location:  Leg Associated symptoms: no fever and no neck pain    patient has a recent left above-the-knee amputation. He was getting out of bed this morning and forgot that he did not have lower leg and stepped onto it. He fell, landing on his stomach. No other injury. No head or neck chest or abdominal pain. No bleeding. No relief with Vicodin and Percocet at home.  Past Medical History  Diagnosis Date  . Hyperlipidemia   . Gout   . Benign prostatic hypertrophy   . Atrial fibrillation     sees Dr. Verdis Prime  . Chronic systolic dysfunction of left ventricle     a. mixed ischemic and nonischemic CM,  EF 35%. b. s/p AICD implantation.  . CHF (congestive heart failure)   . ED (erectile dysfunction)   . Arthritis   . Pacemaker     medtronic  . CAD (coronary artery disease)     a. s/p mid LAD stenting with DES 2008 Dr. Verdis Prime  . ICD (implantable cardiac defibrillator) in place     medtronic, Dr. Johney Frame  . ICD (implantable cardiac defibrillator) in place     due for check in Feb/2013  . Sleep apnea     hx of "had surgery for"  . Hypertension     sees Dr. Claris Che  . Type II diabetes mellitus   . Automatic implantable cardioverter-defibrillator in situ   . Depression   . Numbness and tingling     Hx; 3f left foot  . PAD (peripheral artery disease)     Severe by PV angiogram 09/2011  . Renal artery stenosis     s/p stenting 2009  . PAD (peripheral artery disease)     s/p multiple LLE bypass grafts; left mid-distal SCA occlusion by 08/2012 duplex   Past Surgical History  Procedure Laterality Date  . Turp  vaporization    . Cardiac defibrillator placement  12/26/09    pacemaker combo  . Cervical epidural injection  2013  . Femoral-tibial bypass graft  09/25/2011    Procedure: BYPASS GRAFT FEMORAL-TIBIAL ARTERY;  Surgeon: Pryor Ochoa, MD;  Location: [facility] OR;  Service: Vascular;  Laterality: Left;  Left Femoral - Anterior Tibial Bypass;  saphenous vein graft from left leg  . Intraoperative arteriogram  09/25/2011    Procedure: INTRA OPERATIVE ARTERIOGRAM;  Surgeon: Pryor Ochoa, MD;  Location: [facility] OR;  Service: Vascular;  Laterality: Left;  . Femoral-tibial bypass graft  02/07/2012    Procedure: BYPASS GRAFT FEMORAL-TIBIAL ARTERY;  Surgeon: Larina Earthly, MD;  Location: [facility] OR;  Service: Vascular;  Laterality: Left;  Thrombectomy Left Femoral - Tibial Bypass Graft  . Embolectomy  02/07/2012    Procedure: EMBOLECTOMY;  Surgeon: Larina Earthly, MD;  Location: [facility] OR;  Service: Vascular;  Laterality: Left;  . Femoral-tibial bypass graft  04/03/2012    Procedure: BYPASS GRAFT FEMORAL-TIBIAL ARTERY;  Surgeon: Pryor Ochoa, MD;  Location: [facility] OR;  Service: Vascular;  Laterality: Left;  Redo  . Insert / replace / remove pacemaker  2007  . Coronary angioplasty with stent placement  ~  2000  . Coronary angioplasty    . Uvulopalatopharyngoplasty (uppp)/tonsillectomy/septoplasty  06/30/2003    [name] 06/30/2003 (07/10/2012)  . Shoulder open rotator cuff repair Right 2001    repair of lacerated right/notes 10/11/1999  (07/10/2012)  . Foot surgery Right 03/20/2001    "have plates and screws in; didn't break it" (07/10/2012)  . Carpal tunnel release Right 2002    [name] 03/20/2001 (07/10/2012)  . Biceps tendon repair Right 2001    [name] 03/20/2001 (07/10/2012)  . Renal artery stent  2009  . Femoral-popliteal bypass graft Left 09/16/2012    Procedure: LEFT FEMORAL-POPLITEAL BYPASS GRAFT WITH GORTEX Propaten Graft 6x80 Thin Wall and Left lower leg Angiogram;  Surgeon: Pryor Ochoa, MD;  Location: [facility] OR;  Service: Vascular;   Laterality: Left;  . Colonoscopy      Hx; of  . Tonsillectomy    . Adenoidectomy      Hx; of   . Femoral-tibial bypass graft Left 09/30/2012    Procedure: REDO LEFT FEMORAL-ANTERIOR TIBIAL ARTERY BYPASS USING COMPOSITE CEPHALIC AND BASILIC VEIN GRAFT FROM LEFT ARM;  Surgeon: Pryor Ochoa, MD;  Location: [facility] OR;  Service: Vascular;  Laterality: Left;  . I&d extremity Left 8/Jonathan/2014    Procedure: EXPLORATION AND DEBRIDEMENT OF LEFT GROIN WOUND;  Surgeon: Pryor Ochoa, MD;  Location: [facility] OR;  Service: Vascular;  Laterality: Left;  . Patch angioplasty Left 8/Jonathan/2014    Procedure: PATCH ANGIOPLASTY;  Surgeon: Pryor Ochoa, MD;  Location: [facility] OR;  Service: Vascular;  Laterality: Left;  . Groin debridement Left 11/12/2012    Procedure: CLOSURE INGUINAL WOUND;  Surgeon: Pryor Ochoa, MD;  Location: [facility] OR;  Service: Vascular;  Laterality: Left;  . Embolectomy Left 12/16/2012    Procedure: THROMBECTOMY  LEFT LEG BYPASS;  Surgeon: Sherren Kerns, MD;  Location: [facility] OR;  Service: Vascular;  Laterality: Left;  . Leg amputation above knee Left 02/04/2013    DR LAWSON  . Amputation Left 02/04/2013    Procedure: AMPUTATION ABOVE KNEE-LEFT;  Surgeon: Pryor Ochoa, MD;  Location: [facility] OR;  Service: Vascular;  Laterality: Left;   Family History  Problem Relation Age of Onset  . Cancer      breast/fhx  . Heart disease      fhx  . Diabetes Neg Hx   . Cancer Father    History  Substance Use Topics  . Smoking status: Never Smoker   . Smokeless tobacco: Never Used     Comment: 1-2 cigars when golfing  . Alcohol Use: 2.4 oz/week    2 Glasses of wine, 2 Shots of liquor per week     Comment: 07/10/2012 "galss of wine or vodka tonic 3 X/wk"    Review of Systems  Constitutional: Negative for fever.  Respiratory: Negative for shortness of breath.   Cardiovascular: Negative for chest pain.  Gastrointestinal: Negative for abdominal pain.  Musculoskeletal: Negative for gait problem, joint swelling and  neck pain.  Skin: Positive for wound. Negative for color change and pallor.  Neurological: Negative for headaches.    Allergies  Other  Home Medications   Current Outpatient Rx  Name  Route  Sig  Dispense  Refill  . atorvastatin (LIPITOR) 10 MG tablet   Oral   Take 1 tablet (10 mg total) by mouth daily.   30 tablet   4   . carvedilol (COREG) 6.25 MG tablet   Oral   Take 1 tablet (6.25 mg total) by mouth 2 (two) times  daily with a meal.   180 tablet   3   . docusate sodium 100 MG CAPS   Oral   Take 100 mg by mouth daily. Stool softner--over there counter.   10 capsule   0   . enoxaparin (LOVENOX) 80 MG/0.8ML injection   Subcutaneous   Inject 80 mg into the skin daily.          . finasteride (PROSCAR) 5 MG tablet   Oral   Take 5 mg by mouth daily.         . furosemide (LASIX) 40 MG tablet   Oral   Take 1 tablet (40 mg total) by mouth daily.   90 tablet   3   . gabapentin (NEURONTIN) 400 MG capsule   Oral   Take 2 capsules (800 mg total) by mouth 2 (two) times daily.   120 capsule   1   . insulin aspart protamine- aspart (NOVOLOG MIX 70/30) (70-30) 100 UNIT/ML injection   Subcutaneous   Inject 0.07 mLs (7 Units total) into the skin 2 (two) times daily with a meal. 10 units with breakfast, and 10 units with the evening meal   10 mL   12   . losartan (COZAAR) 100 MG tablet   Oral   Take 1 tablet (100 mg total) by mouth daily.   90 tablet   3   . NITROSTAT 0.4 MG SL tablet   Sublingual   Place 0.4 mg under the tongue every 5 (five) minutes as needed for chest pain. For chest pain, max 3 doses         . oxyCODONE-acetaminophen (PERCOCET/ROXICET) 5-325 MG per tablet   Oral   Take 1 tablet by mouth every 6 (six) hours as needed for severe pain.   100 tablet   0   . polyethylene glycol (MIRALAX / GLYCOLAX) packet   Oral   Take 17 g by mouth daily. For constipation--available over the counter in a bottle         . traMADol (ULTRAM) 50 MG  tablet   Oral   Take 1 tablet (50 mg total) by mouth at bedtime. For neuropathy   30 tablet   1   . warfarin (COUMADIN) 5 MG tablet   Oral   Take 5 mg by mouth daily.          BP 133/60  Pulse 69  Temp(Src) 97.6 F (36.4 C) (Oral)  Resp 16  SpO2 100% Physical Exam  Constitutional: He appears well-developed and well-nourished.  HENT:  Head: Normocephalic.  Cardiovascular: Regular rhythm.   Pulmonary/Chest: Effort normal and breath sounds normal.  Abdominal: Soft. He exhibits no distension.  Musculoskeletal: He exhibits tenderness.  Left above-the-knee indication. Surgical staples intact over a clean nonbleeding wound. Some tenderness medially. No ecchymosis. Right hip is stable. Range of motion intact.    ED Course  Procedures (including critical care time) Labs Review Labs Reviewed - No data to display Imaging Review Dg Femur Left  02/21/2013   CLINICAL DATA:  Leg pain, fall.  EXAM: LEFT FEMUR - 2 VIEW  COMPARISON:  None.  FINDINGS: Changes of above the knee amputation. Degenerative changes in the left hip. No acute fracture, subluxation or dislocation.  IMPRESSION: Above the knee amputation.  No acute bony abnormality.   Electronically Signed   By: Charlett Nose M.D.   On: 02/21/2013 08:48    EKG Interpretation   None       MDM   1. Thigh  contusion, left, initial encounter    Patient with fall. Contusion to left stump. Negative x-ray. Will discharge home    Juliet Rude. Rubin Payor, MD 02/21/13 [phone]

## 2013-02-22 ENCOUNTER — Telehealth: Payer: Self-pay | Admitting: Family Medicine

## 2013-02-22 NOTE — Telephone Encounter (Signed)
[facility] would like to know if its ok to do pt coum check on Friday. 12/26?  They were to do it on 12/29. But pt is no longer going out of town, so would like earlier Also is it ok to use coag machine?

## 2013-02-23 ENCOUNTER — Telehealth: Payer: Self-pay

## 2013-02-23 NOTE — Telephone Encounter (Signed)
[name] an Charity fundraiser with [facility] called to reqeust occupational therapist eval and treat for safety in the shower.

## 2013-02-23 NOTE — Telephone Encounter (Signed)
That would be fine -- thanks  

## 2013-02-23 NOTE — Telephone Encounter (Signed)
Informed Joyce Gross RN with [facility] that request for occupational therapist for evaluation and treatment for shower safety was ok.

## 2013-02-26 ENCOUNTER — Ambulatory Visit (INDEPENDENT_AMBULATORY_CARE_PROVIDER_SITE_OTHER): Payer: Medicare Other | Admitting: General Practice

## 2013-02-26 LAB — POCT INR: INR: 2.3

## 2013-02-26 NOTE — Progress Notes (Signed)
Pre-visit discussion using our clinic review tool. No additional management support is needed unless otherwise documented below in the visit note.  

## 2013-03-01 ENCOUNTER — Telehealth: Payer: Self-pay

## 2013-03-01 ENCOUNTER — Ambulatory Visit (INDEPENDENT_AMBULATORY_CARE_PROVIDER_SITE_OTHER): Payer: Medicare Other | Admitting: Family Medicine

## 2013-03-01 ENCOUNTER — Other Ambulatory Visit: Payer: Self-pay | Admitting: *Deleted

## 2013-03-01 ENCOUNTER — Other Ambulatory Visit: Payer: Self-pay

## 2013-03-01 DIAGNOSIS — I739 Peripheral vascular disease, unspecified: Secondary | ICD-10-CM

## 2013-03-01 DIAGNOSIS — Z48812 Encounter for surgical aftercare following surgery on the circulatory system: Secondary | ICD-10-CM

## 2013-03-01 DIAGNOSIS — Z111 Encounter for screening for respiratory tuberculosis: Secondary | ICD-10-CM

## 2013-03-01 MED ORDER — OXYCODONE-ACETAMINOPHEN 5-325 MG PO TABS: 1.0000 | ORAL_TABLET | Freq: Four times a day (QID) | ORAL | Status: DC | PRN

## 2013-03-01 NOTE — Telephone Encounter (Signed)
Message copied by Phillips Odor on Mon Mar 01, 2013 12:14 PM ------      Message from: Marcellus Scott      Created: Mon Mar 01, 2013  9:35 AM      Regarding: Regarding Right leg and left stump      Contact: [phone]       [name] called [name]. He had his left leg amputated on December 4th. He is now seeing a spot on his right foot and very worried. He would liked to have it checked out asap. He also would like to have his stump checked out on left side. He says it's swollen.             Thanks      Revonda Standard ------

## 2013-03-01 NOTE — Telephone Encounter (Signed)
rec'd voice message from pt's son to report symptoms, and get earlier appt. Scheduled.  Attempted to call pt's son back; left message on his personal voicemail.  Phone call to pt.  Questioned about current symptoms.  Stated his left AKA stump had some swelling, and he hadn't been wearing his stump shrinker since he fell just prior to Christmas.  Stated after the fall, he "went to the ER and was checked-out."   Pt. asked me to speak with the Therapist in his home at time of phone call.  Spoke with "Selena Batten, Occupational Therapist".  She assessed pt's right heel.  Described a small dark red scabbed area about size of 1/2 a baby Aspirin, on medial aspect of right foot.  States area looks dry; denies any redness in surrounding tissue.  Discussed with Dr. Hart Rochester.  Informed of dry scabbed area on right heel, and of swelling of left AKA stump.  Recommends to keep appt. On 03/09/13, and to check ABI's at time of appt. 03/09/13.  Advised pt. Of Dr. Candie Chroman recommendations.  Encouraged to call office if symptoms worsen prior to f/u appt. 1/6.  Verb. Understanding.

## 2013-03-02 ENCOUNTER — Telehealth: Payer: Self-pay

## 2013-03-02 NOTE — Telephone Encounter (Signed)
Kim an occupational therapist with [facility] called to get therapy approved for upperbody and safety.  Patient may also be moving to an assisted living facility.

## 2013-03-02 NOTE — Telephone Encounter (Signed)
Rec'd phone call from Selena Batten, Acupuncturist, with update on right heel.  Stated pt saw his PCP yesterday and had antibacterial ointment and bandaid applied to area right heel. Stated that the scab was no longer present on the small area of the right heel.  Described area on the heel as "lighter in color and more moist now", since the bandaid and ointment had been applied yesterday. Called pt. Advised to keep the right heel ulcer clean and dry; advised to wash it with an antibacterial soap and keep a dry gauze dressing over it, instead of applying ointment and bandaid. Encouraged to protect right heel with a thick sock.  Advised to call if worsening symptoms.  Pt. verb. understanding.

## 2013-03-03 ENCOUNTER — Other Ambulatory Visit: Payer: Self-pay | Admitting: Family

## 2013-03-03 LAB — TB SKIN TEST: TB Skin Test: NEGATIVE

## 2013-03-03 MED ORDER — OXYCODONE-ACETAMINOPHEN 5-325 MG PO TABS: 1.0000 | ORAL_TABLET | Freq: Four times a day (QID) | ORAL | Status: DC | PRN

## 2013-03-03 MED ORDER — GABAPENTIN 400 MG PO CAPS: 400.0000 mg | ORAL_CAPSULE | Freq: Every day | ORAL | Status: DC

## 2013-03-03 MED ORDER — DSS 100 MG PO CAPS: 100.0000 mg | ORAL_CAPSULE | ORAL | Status: DC | PRN

## 2013-03-03 MED ORDER — WARFARIN SODIUM 2.5 MG PO TABS: 2.5000 mg | ORAL_TABLET | Freq: Every day | ORAL | Status: DC

## 2013-03-03 MED ORDER — INSULIN ASPART PROT & ASPART (70-30 MIX) 100 UNIT/ML ~~LOC~~ SUSP: 7.0000 [IU] | Freq: Two times a day (BID) | SUBCUTANEOUS | Status: DC

## 2013-03-03 NOTE — Telephone Encounter (Signed)
Left message for [name] to continue therapy as she can.

## 2013-03-05 ENCOUNTER — Telehealth: Payer: Self-pay

## 2013-03-05 NOTE — Telephone Encounter (Signed)
Monica received a vm from [facility] stating that pt's signed medication list is not correct and clarification is needed. Pls advise.

## 2013-03-08 ENCOUNTER — Ambulatory Visit (INDEPENDENT_AMBULATORY_CARE_PROVIDER_SITE_OTHER): Payer: Medicare Other | Admitting: Family Medicine

## 2013-03-08 ENCOUNTER — Encounter: Payer: Self-pay | Admitting: Family Medicine

## 2013-03-08 ENCOUNTER — Ambulatory Visit (INDEPENDENT_AMBULATORY_CARE_PROVIDER_SITE_OTHER): Payer: Medicare Other | Admitting: General Practice

## 2013-03-08 ENCOUNTER — Encounter: Payer: Self-pay | Admitting: Vascular Surgery

## 2013-03-08 DIAGNOSIS — I4891 Unspecified atrial fibrillation: Secondary | ICD-10-CM

## 2013-03-08 DIAGNOSIS — Z5189 Encounter for other specified aftercare: Secondary | ICD-10-CM

## 2013-03-08 DIAGNOSIS — E1059 Type 1 diabetes mellitus with other circulatory complications: Secondary | ICD-10-CM

## 2013-03-08 DIAGNOSIS — G609 Hereditary and idiopathic neuropathy, unspecified: Secondary | ICD-10-CM

## 2013-03-08 DIAGNOSIS — N259 Disorder resulting from impaired renal tubular function, unspecified: Secondary | ICD-10-CM

## 2013-03-08 DIAGNOSIS — I10 Essential (primary) hypertension: Secondary | ICD-10-CM

## 2013-03-08 DIAGNOSIS — I739 Peripheral vascular disease, unspecified: Secondary | ICD-10-CM

## 2013-03-08 DIAGNOSIS — I2510 Atherosclerotic heart disease of native coronary artery without angina pectoris: Secondary | ICD-10-CM

## 2013-03-08 LAB — HEMOGLOBIN A1C: HEMOGLOBIN A1C: 6 % (ref 4.6–6.5)

## 2013-03-08 LAB — POCT INR: INR: 2.2

## 2013-03-08 NOTE — Progress Notes (Signed)
Pre-visit discussion using our clinic review tool. No additional management support is needed unless otherwise documented below in the visit note.  

## 2013-03-08 NOTE — Progress Notes (Signed)
   Subjective:    Patient ID: [name], male    DOB: 1934-12-12, 79 y.o.   MRN: [id number]  HPI Here to follow up on medical issues after a left AKA per Dr. Kellie Simmering on 02-04-13. This went well and he had a brief rehab stay per Dr. Naaman Plummer after that. He is now at an assisted living facility getting daily PT and OT. He is in a wheelchair for now but he sees Dr. Kellie Simmering tomorrow, and he may be fitted with a prosthesis soon. He feels well in general although he still deals with neuropathic pains and phantom pains in the legs. No SOB or chest discomfort. His appetite is good. BMs are regular with Miralax and occasional MOM. His BP is stable. His fasting morning glucoses run from 110 to 130 most of the time. His last BMET from a few weeks ago showed stable renal function with  BUN of 43 and a creatinine of 1.8.    Review of Systems  Constitutional: Negative.   Eyes: Negative.   Respiratory: Negative.   Cardiovascular: Negative.   Gastrointestinal: Negative.   Neurological: Negative.        Objective:   Physical Exam  Constitutional: He is oriented to person, place, and time. He appears well-developed and well-nourished.  In a wheelchair  Cardiovascular: Normal rate, regular rhythm, normal heart sounds and intact distal pulses.   Pulmonary/Chest: Effort normal and breath sounds normal.  Neurological: He is alert and oriented to person, place, and time.          Assessment & Plan:  He seems to be doing well after a left AKA. He sees Dr. Kellie Simmering tomorrow. We will check an A1c today.

## 2013-03-08 NOTE — Telephone Encounter (Signed)
Pt is here now to see Dr. Sarajane Jews and they will discuss this.

## 2013-03-08 NOTE — Progress Notes (Signed)
Pre visit review using our clinic review tool, if applicable. No additional management support is needed unless otherwise documented below in the visit note. 

## 2013-03-09 ENCOUNTER — Ambulatory Visit (INDEPENDENT_AMBULATORY_CARE_PROVIDER_SITE_OTHER)
Admission: RE | Admit: 2013-03-09 | Discharge: 2013-03-09 | Disposition: A | Discharge status: Home / Self Care | Payer: Medicare Other | Source: Ambulatory Visit | Attending: Vascular Surgery | Admitting: Vascular Surgery

## 2013-03-09 ENCOUNTER — Encounter: Payer: Medicare Other | Admitting: Vascular Surgery

## 2013-03-09 ENCOUNTER — Ambulatory Visit (INDEPENDENT_AMBULATORY_CARE_PROVIDER_SITE_OTHER): Payer: Medicare Other | Admitting: Family

## 2013-03-09 ENCOUNTER — Encounter: Payer: Self-pay | Admitting: Family

## 2013-03-09 ENCOUNTER — Ambulatory Visit (HOSPITAL_COMMUNITY)
Admission: RE | Admit: 2013-03-09 | Discharge: 2013-03-09 | Disposition: A | Discharge status: Home / Self Care | Payer: Medicare Other | Source: Ambulatory Visit | Attending: Vascular Surgery | Admitting: Vascular Surgery

## 2013-03-09 VITALS — BP 118/69 | HR 70 | Temp 97.0°F | Resp 16 | Ht 68.0 in | Wt 165.0 lb

## 2013-03-09 DIAGNOSIS — L98499 Non-pressure chronic ulcer of skin of other sites with unspecified severity: Principal | ICD-10-CM | POA: Insufficient documentation

## 2013-03-09 DIAGNOSIS — Z48812 Encounter for surgical aftercare following surgery on the circulatory system: Secondary | ICD-10-CM

## 2013-03-09 DIAGNOSIS — I739 Peripheral vascular disease, unspecified: Secondary | ICD-10-CM

## 2013-03-09 DIAGNOSIS — Z4802 Encounter for removal of sutures: Secondary | ICD-10-CM

## 2013-03-09 NOTE — Patient Instructions (Signed)
Peripheral Vascular Disease Peripheral Vascular Disease (PVD), also called Peripheral Arterial Disease (PAD), is a circulation problem caused by cholesterol (atherosclerotic plaque) deposits in the arteries. PVD commonly occurs in the lower extremities (legs) but it can occur in other areas of the body, such as your arms. The cholesterol buildup in the arteries reduces blood flow which can cause pain and other serious problems. The presence of PVD can place a person at risk for Coronary Artery Disease (CAD).  CAUSES  Causes of PVD can be many. It is usually associated with more than one risk factor such as:   High Cholesterol.  Smoking.  Diabetes.  Lack of exercise or inactivity.  High blood pressure (hypertension).  Obesity.  Family history. SYMPTOMS   When the lower extremities are affected, patients with PVD may experience:  Leg pain with exertion or physical activity. This is called INTERMITTENT CLAUDICATION. This may present as cramping or numbness with physical activity. The location of the pain is associated with the level of blockage. For example, blockage at the abdominal level (distal abdominal aorta) may result in buttock or hip pain. Lower leg arterial blockage may result in calf pain.  As PVD becomes more severe, pain can develop with less physical activity.  In people with severe PVD, leg pain may occur at rest.  Other PVD signs and symptoms:  Leg numbness or weakness.  Coldness in the affected leg or foot, especially when compared to the other leg.  A change in leg color.  Patients with significant PVD are more prone to ulcers or sores on toes, feet or legs. These may take longer to heal or may reoccur. The ulcers or sores can become infected.  If signs and symptoms of PVD are ignored, gangrene may occur. This can result in the loss of toes or loss of an entire limb.  Not all leg pain is related to PVD. Other medical conditions can cause leg pain such  as:  Blood clots (embolism) or Deep Vein Thrombosis.  Inflammation of the blood vessels (vasculitis).  Spinal stenosis. DIAGNOSIS  Diagnosis of PVD can involve several different types of tests. These can include:  Pulse Volume Recording Method (PVR). This test is simple, painless and does not involve the use of X-rays. PVR involves measuring and comparing the blood pressure in the arms and legs. An ABI (Ankle-Brachial Index) is calculated. The normal ratio of blood pressures is 1. As this number becomes smaller, it indicates more severe disease.  < 0.95  indicates significant narrowing in one or more leg vessels.  <0.8 there will usually be pain in the foot, leg or buttock with exercise.  <0.4 will usually have pain in the legs at rest.  <0.25  usually indicates limb threatening PVD.  Doppler detection of pulses in the legs. This test is painless and checks to see if you have a pulses in your legs/feet.  A dye or contrast material (a substance that highlights the blood vessels so they show up on x-ray) may be given to help your caregiver better see the arteries for the following tests. The dye is eliminated from your body by the kidney's. Your caregiver may order blood work to check your kidney function and other laboratory values before the following tests are performed:  Magnetic Resonance Angiography (MRA). An MRA is a picture study of the blood vessels and arteries. The MRA machine uses a large magnet to produce images of the blood vessels.  Computed Tomography Angiography (CTA). A CTA is a   specialized x-ray that looks at how the blood flows in your blood vessels. An IV may be inserted into your arm so contrast dye can be injected.  Angiogram. Is a procedure that uses x-rays to look at your blood vessels. This procedure is minimally invasive, meaning a small incision (cut) is made in your groin. A small tube (catheter) is then inserted into the artery of your groin. The catheter is  guided to the blood vessel or artery your caregiver wants to examine. Contrast dye is injected into the catheter. X-rays are then taken of the blood vessel or artery. After the images are obtained, the catheter is taken out. TREATMENT  Treatment of PVD involves many interventions which may include:  Lifestyle changes:  Quitting smoking.  Exercise.  Following a low fat, low cholesterol diet.  Control of diabetes.  Foot care is very important to the PVD patient. Good foot care can help prevent infection.  Medication:  Cholesterol-lowering medicine.  Blood pressure medicine.  Anti-platelet drugs.  Certain medicines may reduce symptoms of Intermittent Claudication.  Interventional/Surgical options:  Angioplasty. An Angioplasty is a procedure that inflates a balloon in the blocked artery. This opens the blocked artery to improve blood flow.  Stent Implant. A wire mesh tube (stent) is placed in the artery. The stent expands and stays in place, allowing the artery to remain open.  Peripheral Bypass Surgery. This is a surgical procedure that reroutes the blood around a blocked artery to help improve blood flow. This type of procedure may be performed if Angioplasty or stent implants are not an option. SEEK IMMEDIATE MEDICAL CARE IF:   You develop pain or numbness in your arms or legs.  Your arm or leg turns cold, becomes blue in color.  You develop redness, warmth, swelling and pain in your arms or legs. MAKE SURE YOU:   Understand these instructions.  Will watch your condition.  Will get help right away if you are not doing well or get worse. Document Released: 03/28/2004 Document Revised: 05/13/2011 Document Reviewed: 02/23/2008 Exit[employer] Patient Information 2014 [employer].  

## 2013-03-09 NOTE — Progress Notes (Addendum)
[facility] [place] HISTORY AND PHYSICAL -PAD   History of Present Illness [name] is a 79 y.o. male patient of Dr. Kellie Simmering who is s/p left  AKA Feb 04, 2013, he returns today for staple removal. He denies fever or chills.  He denies rest pain, denies non-healing wounds other than 1/4 cm dried lesion on inner aspect right heel. Has 5/10 phantom pain in left leg, does get down to 0 at times, needs more analgesics he states, he denies constipation. Taking coumadin and states yesterday's INR is on target. He states that his right ankle has no swelling in the morning, but swelling increases as the day progresses; states he does not elevate his right leg nor wear compression stocking.  Pt Diabetic: Yes, patient reports controlled Pt smoker: non-smoker   Past Medical History  Diagnosis Date  . Hyperlipidemia   . Gout   . Benign prostatic hypertrophy   . Atrial fibrillation     sees Dr. Daneen Schick  . Chronic systolic dysfunction of left ventricle     a. mixed ischemic and nonischemic CM,  EF 35%. b. s/p AICD implantation.  . CHF (congestive heart failure)   . ED (erectile dysfunction)   . Arthritis   . Pacemaker     medtronic  . CAD (coronary artery disease)     a. s/p mid LAD stenting with DES 2008 Dr. Daneen Schick  . ICD (implantable cardiac defibrillator) in place     medtronic, Dr. Rayann Heman  . ICD (implantable cardiac defibrillator) in place     due for check in Feb/2013  . Sleep apnea     hx of "had surgery for"  . Hypertension     sees Dr. Barbie Banner  . Type II diabetes mellitus   . Automatic implantable cardioverter-defibrillator in situ   . Depression   . Numbness and tingling     Hx; 76f left foot  . PAD (peripheral artery disease)     Severe by PV angiogram 09/2011  . Renal artery stenosis     s/p stenting 2009  . PAD (peripheral artery disease)     s/p multiple LLE bypass grafts; left mid-distal SCA occlusion by 08/2012 duplex    Social  History History  Substance Use Topics  . Smoking status: Never Smoker   . Smokeless tobacco: Never Used     Comment: 1-2 cigars when golfing  . Alcohol Use: 2.4 oz/week    2 Glasses of wine, 2 Shots of liquor per week     Comment: 07/10/2012 "galss of wine or vodka tonic 3 X/wk"    Family History Family History  Problem Relation Age of Onset  . Cancer      breast/fhx  . Heart disease      fhx  . Diabetes Neg Hx   . Cancer Father     Past Surgical History  Procedure Laterality Date  . Turp vaporization    . Cardiac defibrillator placement  12/26/09    pacemaker combo  . Cervical epidural injection  2013  . Femoral-tibial bypass graft  09/25/2011    Procedure: BYPASS GRAFT FEMORAL-TIBIAL ARTERY;  Surgeon: Mal Misty, MD;  Location: [facility] OR;  Service: Vascular;  Laterality: Left;  Left Femoral - Anterior Tibial Bypass;  saphenous vein graft from left leg  . Intraoperative arteriogram  09/25/2011    Procedure: INTRA OPERATIVE ARTERIOGRAM;  Surgeon: Mal Misty, MD;  Location: [place];  Service: Vascular;  Laterality: Left;  .  Femoral-tibial bypass graft  02/07/2012    Procedure: BYPASS GRAFT FEMORAL-TIBIAL ARTERY;  Surgeon: Rosetta Posner, MD;  Location: [place];  Service: Vascular;  Laterality: Left;  Thrombectomy Left Femoral - Tibial Bypass Graft  . Embolectomy  02/07/2012    Procedure: EMBOLECTOMY;  Surgeon: Rosetta Posner, MD;  Location: [place];  Service: Vascular;  Laterality: Left;  . Femoral-tibial bypass graft  04/03/2012    Procedure: BYPASS GRAFT FEMORAL-TIBIAL ARTERY;  Surgeon: Mal Misty, MD;  Location: [place];  Service: Vascular;  Laterality: Left;  Redo  . Insert / replace / remove pacemaker  2007  . Coronary angioplasty with stent placement  ~ 2000  . Coronary angioplasty    . Uvulopalatopharyngoplasty (uppp)/tonsillectomy/septoplasty  06/30/2003    Archie Endo 06/30/2003 (07/10/2012)  . Shoulder open rotator cuff repair Right 2001    repair of lacerated right/notes 10/11/1999   (07/10/2012)  . Foot surgery Right 03/20/2001    "have plates and screws in; didn't break it" (07/10/2012)  . Carpal tunnel release Right 2002    Archie Endo 03/20/2001 (07/10/2012)  . Biceps tendon repair Right 2001    Archie Endo 03/20/2001 (07/10/2012)  . Renal artery stent  2009  . Femoral-popliteal bypass graft Left 09/16/2012    Procedure: LEFT FEMORAL-POPLITEAL BYPASS GRAFT WITH GORTEX Propaten Graft 6x80 Thin Wall and Left lower leg Angiogram;  Surgeon: Mal Misty, MD;  Location: [place];  Service: Vascular;  Laterality: Left;  . Colonoscopy      Hx; of  . Tonsillectomy    . Adenoidectomy      Hx; of   . Femoral-tibial bypass graft Left 09/30/2012    Procedure: REDO LEFT FEMORAL-ANTERIOR TIBIAL ARTERY BYPASS USING COMPOSITE CEPHALIC AND BASILIC VEIN GRAFT FROM LEFT ARM;  Surgeon: Mal Misty, MD;  Location: [place];  Service: Vascular;  Laterality: Left;  . I&d extremity Left 10/21/2012    Procedure: EXPLORATION AND DEBRIDEMENT OF LEFT GROIN WOUND;  Surgeon: Mal Misty, MD;  Location: [place];  Service: Vascular;  Laterality: Left;  . Patch angioplasty Left 10/21/2012    Procedure: PATCH ANGIOPLASTY;  Surgeon: Mal Misty, MD;  Location: [place];  Service: Vascular;  Laterality: Left;  . Groin debridement Left 11/12/2012    Procedure: CLOSURE INGUINAL WOUND;  Surgeon: Mal Misty, MD;  Location: [place];  Service: Vascular;  Laterality: Left;  . Embolectomy Left 12/16/2012    Procedure: THROMBECTOMY  LEFT LEG BYPASS;  Surgeon: Elam Dutch, MD;  Location: [place];  Service: Vascular;  Laterality: Left;  . Leg amputation above knee Left 02/04/2013    DR LAWSON  . Amputation Left 02/04/2013    Procedure: AMPUTATION ABOVE KNEE-LEFT;  Surgeon: Mal Misty, MD;  Location: [place];  Service: Vascular;  Laterality: Left;    Allergies  Allergen Reactions  . Other Other (See Comments)    Plastic tape tears skin    Current Outpatient Prescriptions  Medication Sig Dispense Refill  . carvedilol  (COREG) 6.25 MG tablet Take 1 tablet (6.25 mg total) by mouth 2 (two) times daily with a meal.  180 tablet  3  . Docusate Sodium (DSS) 100 MG CAPS Take 100 mg by mouth as needed. Stool softner--over there counter.  30 each  3  . finasteride (PROSCAR) 5 MG tablet Take 5 mg by mouth daily.      . furosemide (LASIX) 40 MG tablet Take 1 tablet (40 mg total) by mouth daily.  90 tablet  3  .  gabapentin (NEURONTIN) 400 MG capsule Take 1 capsule (400 mg total) by mouth daily. 1 tab in the am and 2 in the evening  120 capsule  1  . insulin aspart protamine- aspart (NOVOLOG MIX 70/30) (70-30) 100 UNIT/ML injection Inject 7 Units into the skin 2 (two) times daily with a meal.      . losartan (COZAAR) 100 MG tablet Take 1 tablet (100 mg total) by mouth daily.  90 tablet  3  . NITROSTAT 0.4 MG SL tablet Place 0.4 mg under the tongue every 5 (five) minutes as needed for chest pain. For chest pain, max 3 doses      . oxyCODONE-acetaminophen (PERCOCET/ROXICET) 5-325 MG per tablet Take 1 tablet by mouth every 6 (six) hours as needed for severe pain.  100 tablet  0  . polyethylene glycol (MIRALAX / GLYCOLAX) packet Take 17 g by mouth daily. For constipation--available over the counter in a bottle      . traMADol (ULTRAM) 50 MG tablet Take 1 tablet (50 mg total) by mouth at bedtime. For neuropathy  30 tablet  1  . traZODone (DESYREL) 50 MG tablet       . warfarin (COUMADIN) 2.5 MG tablet Take 1 tablet (2.5 mg total) by mouth daily.  30 tablet  2   No current facility-administered medications for this visit.    ROS: See HPI for pertinent positives and negatives.   Physical Examination  Filed Vitals:   03/09/13 1432  BP: 118/69  Pulse: 70  Temp: 97 F (36.1 C)  Resp: 16    General: A&O x 3, WDWN,  Gait: in wheelchair Eyes: PERRLA, Pulmonary: CTAB, without wheezes , rales or rhonchi Cardiac: regular Rythm , without murmur          Carotid Bruits Left Right   Negative Negative   Radial pulses:  Radial pulses: left radial and brachial not palpable, right radial is faintly palpable                         VASCULAR EXAM: Extremities without ischemic changes  without Gangrene; without open wounds. Left AKA incision edges are well proximated, no redness, no drainage, no swelling, staples in place. Right ankle with 2+ pitting edema, no stasis skin changes, no ulcers.                                                                                                          LE Pulses LEFT RIGHT       POPLITEAL AKA  not palpable       POSTERIOR TIBIAL AKA   not palpable        DORSALIS PEDIS      ANTERIOR TIBIAL AKA  not palpable    Abdomen: soft, NT, no masses. Skin: no rashes, no ulcers noted. Musculoskeletal: no muscle wasting or atrophy. See extremities exam above.  Neurologic: A&O X 3; Appropriate Affect ; SENSATION: normal; MOTOR FUNCTION:  moving all extremities equally, motor strength 5/5 throughout. Speech is fluent/normal. CN 2-12 intact.  ASSESSMENT: RAZI HICKLE is a 79 y.o. male who is s/p left  AKA Feb 04, 2013, and has a history of multiple lower extremity arterial vascular procedures. His left stump incision is well healed, no s/s of infection. He has a history of ETOH abuse which may be current. He is requesting analgesics which he needs a month after an AKA.  He also is a fall risk and has a history of falling. His analgesic prescription history was reviewed, he is currently taking tramadol and oxycodone prescribed by other providers. He currently resides in a rehabilitation facility and Rehabilitative Medicine has been managing his analgesics; will defer further analgesic management to Rehabilitative Medicine.   He has venous insufficiency in his right lower leg as manifested by right ankle swelling by the end of the day and no swelling in the morning, advised knee high graduated compression stocking, 20-30 mm Hg, wear during the day, remove at bedtime. Also  elevate RLE when he is not trying to ambulate. Right ABI is 0.77, moderate arterial occlusive disease. >50% stenosis in the right CFA and prox. FA and mid FA; reviewed with Dr. Donnetta Hutching.  PLAN:  I discussed in depth with the patient the nature of atherosclerosis, and emphasized the importance of maximal medical management including strict control of blood pressure, blood glucose, and lipid levels, obtaining regular exercise, and cessation of smoking.  The patient is aware that without maximal medical management the underlying atherosclerotic disease process will progress, limiting the benefit of any interventions. Based on the patient's vascular studies and examination, and after discussing with Dr. Donnetta Hutching, pt will return to clinic in 2-3 weeks to see Dr. Kellie Simmering. Defer pain management to Rehabilitative Medicine who has been managing his analgesics.   The patient was given information about PAD including signs, symptoms, treatment, what symptoms should prompt the patient to seek immediate medical care, and risk reduction measures to take.  Clemon Chambers, RN, MSN, FNP-C [facility] Phone: [phone]  Clinic MD: Early  03/09/2013 3:15 PM

## 2013-03-10 DIAGNOSIS — Z0279 Encounter for issue of other medical certificate: Secondary | ICD-10-CM

## 2013-03-17 ENCOUNTER — Telehealth: Payer: Self-pay | Admitting: Family Medicine

## 2013-03-17 NOTE — Telephone Encounter (Signed)
Patient Information:  Caller Name: Ulis Rias  Phone: [phone]  Patient: [name], [name]  Gender: Male  DOB: [birth year]  Age: 79 Years  PCP: [name] [facility] [place])  Office Follow Up:  Does the office need to follow up with this patient?: Yes  Instructions For The Office: Please f/u with patient to be sure he scheduled INR for 03/18/13   Symptoms  Reason For Call & Symptoms: [place] about 4-5 purple bruises on R arm- takes 2.5 mgs  [place] MWF and 5 mgs on T,Th,Sat and Sun.  INR checked in the office on 03/08/13- results unavailable when checked in EPIC. He is at [facility]. AKA on 02/17/14. Cell # [phone]- contacted patient after speaking with Dr. Asa Lente, and advised that he call the office on 03/18/13 to come into [facility] to be checked. No other signs of bleeding.  Reviewed Health History In EMR: Yes  Reviewed Medications In EMR: Yes  Reviewed Allergies In EMR: Yes  Reviewed Surgeries / Procedures: Yes  Date of Onset of Symptoms: 03/16/2013  Guideline(s) Used:  Rash - Widespread on Drugs - Drug Reaction  Disposition Per Guideline:   Go to Office Now  Reason For Disposition Reached:   Purple or blood-colored spots or dots (no fever)  Advice Given:  Call Back If:  You become worse.  Patient Refused Recommendation:  Patient Will Follow Up With Office Later  He will call in AM on 03/18/13 to be scheduled in [facility]

## 2013-03-17 NOTE — Telephone Encounter (Signed)
[facility] would like to (verbal order) request 3 additional visits for pt and a Education officer, museum for community resources. Pt want to return to his own home from his present assisted living situation;

## 2013-03-18 ENCOUNTER — Encounter: Payer: Medicare Other | Admitting: *Deleted

## 2013-03-18 ENCOUNTER — Ambulatory Visit (INDEPENDENT_AMBULATORY_CARE_PROVIDER_SITE_OTHER): Payer: Medicare Other | Admitting: General Practice

## 2013-03-18 DIAGNOSIS — I4891 Unspecified atrial fibrillation: Secondary | ICD-10-CM

## 2013-03-18 LAB — POCT INR: INR: 3.2

## 2013-03-18 NOTE — Telephone Encounter (Signed)
FYI

## 2013-03-18 NOTE — Telephone Encounter (Signed)
Pt had inr done 03/18/13

## 2013-03-18 NOTE — Telephone Encounter (Signed)
Call [facility] to order 3 additional visits for OT and order a social worker consult to help with community resources

## 2013-03-18 NOTE — Telephone Encounter (Signed)
I left a voice message on [name]'s phone with [facility] and gave the below information.

## 2013-03-18 NOTE — Progress Notes (Signed)
Pre-visit discussion using our clinic review tool. No additional management support is needed unless otherwise documented below in the visit note.  

## 2013-03-20 DIAGNOSIS — Z48812 Encounter for surgical aftercare following surgery on the circulatory system: Secondary | ICD-10-CM | POA: Diagnosis not present

## 2013-03-20 DIAGNOSIS — I798 Other disorders of arteries, arterioles and capillaries in diseases classified elsewhere: Secondary | ICD-10-CM | POA: Diagnosis not present

## 2013-03-20 DIAGNOSIS — S78119A Complete traumatic amputation at level between unspecified hip and knee, initial encounter: Secondary | ICD-10-CM | POA: Diagnosis not present

## 2013-03-20 DIAGNOSIS — E1159 Type 2 diabetes mellitus with other circulatory complications: Secondary | ICD-10-CM | POA: Diagnosis not present

## 2013-03-22 ENCOUNTER — Telehealth: Payer: Self-pay

## 2013-03-22 ENCOUNTER — Encounter: Payer: Self-pay | Admitting: *Deleted

## 2013-03-22 NOTE — Telephone Encounter (Signed)
Received call from the nurse, [place] @ [facility].  Reports pt. Has started having "mod. amt. Serous- sanguinous drainage from small open area left stump incision."   States that the drainage has increased during the day; reports dressing was changed approx. 9:00 AM, and was completely saturated by 2:00 PM.  States there is no odor or redness/inflammation.  Denies fever.  Appt. given for 03/23/13 @ 10:30 AM.

## 2013-03-23 ENCOUNTER — Encounter: Payer: Self-pay | Admitting: Vascular Surgery

## 2013-03-23 ENCOUNTER — Ambulatory Visit (INDEPENDENT_AMBULATORY_CARE_PROVIDER_SITE_OTHER): Payer: Medicare Other | Admitting: Vascular Surgery

## 2013-03-23 VITALS — BP 134/47 | HR 78 | Temp 97.4°F | Ht 68.0 in | Wt 165.0 lb

## 2013-03-23 DIAGNOSIS — I739 Peripheral vascular disease, unspecified: Secondary | ICD-10-CM

## 2013-03-23 DIAGNOSIS — L24A9 Irritant contact dermatitis due friction or contact with other specified body fluids: Secondary | ICD-10-CM | POA: Insufficient documentation

## 2013-03-23 DIAGNOSIS — T148XXA Other injury of unspecified body region, initial encounter: Secondary | ICD-10-CM | POA: Insufficient documentation

## 2013-03-23 NOTE — Progress Notes (Signed)
Subjective:     Patient ID: [name], male   DOB: April 22, 1934, 79 y.o.   MRN: [id number]  HPI this 79 year old male is status post left AKA seen today because of some spontaneous bleeding from his AKA stump at the nursing facility. Patient is on Coumadin. INR on 1:15 was 3.2. Has had no further bleeding. Denies any chills and fever. Will begin treatment with prosthesis training tomorrow  Review of Systems     Objective:   Physical Exam BP 134/47  Pulse 78  Temp(Src) 97.4 F (36.3 C) (Oral)  Ht 5\' 8"  (1.727 m)  Wt 165 lb (74.844 kg)  BMI 25.09 kg/m2  SpO2 100%  General well-developed well-nourished male no apparent stress alert and oriented x3 Left AKA well-healed's. No evidence of infection. No evidence of active bleeding. Right foot well perfused.     Assessment:     Doing well post left AKA no evidence of bleeding at current time-patient is on Coumadin with INR of 3.2 last week    Plan:     If further bleeding should occur patient needs repeat INR to be checked by a physician who is managing his Coumadin Return to see me in 3 months

## 2013-03-24 ENCOUNTER — Encounter: Payer: Self-pay | Admitting: Physical Medicine & Rehabilitation

## 2013-03-24 ENCOUNTER — Encounter: Payer: Medicare Other | Attending: Physical Medicine & Rehabilitation | Admitting: Physical Medicine & Rehabilitation

## 2013-03-24 VITALS — BP 151/66 | HR 69 | Resp 14 | Ht 68.0 in | Wt 162.0 lb

## 2013-03-24 DIAGNOSIS — S78119A Complete traumatic amputation at level between unspecified hip and knee, initial encounter: Secondary | ICD-10-CM

## 2013-03-24 DIAGNOSIS — Z79899 Other long term (current) drug therapy: Secondary | ICD-10-CM | POA: Insufficient documentation

## 2013-03-24 DIAGNOSIS — I10 Essential (primary) hypertension: Secondary | ICD-10-CM | POA: Insufficient documentation

## 2013-03-24 DIAGNOSIS — E119 Type 2 diabetes mellitus without complications: Secondary | ICD-10-CM | POA: Insufficient documentation

## 2013-03-24 DIAGNOSIS — G547 Phantom limb syndrome without pain: Secondary | ICD-10-CM

## 2013-03-24 DIAGNOSIS — Z95810 Presence of automatic (implantable) cardiac defibrillator: Secondary | ICD-10-CM | POA: Insufficient documentation

## 2013-03-24 DIAGNOSIS — I4891 Unspecified atrial fibrillation: Secondary | ICD-10-CM | POA: Insufficient documentation

## 2013-03-24 DIAGNOSIS — I739 Peripheral vascular disease, unspecified: Secondary | ICD-10-CM | POA: Insufficient documentation

## 2013-03-24 DIAGNOSIS — G609 Hereditary and idiopathic neuropathy, unspecified: Secondary | ICD-10-CM

## 2013-03-24 DIAGNOSIS — G546 Phantom limb syndrome with pain: Secondary | ICD-10-CM

## 2013-03-24 MED ORDER — OXYCODONE-ACETAMINOPHEN 5-325 MG PO TABS: 1.0000 | ORAL_TABLET | Freq: Four times a day (QID) | ORAL | Status: DC | PRN

## 2013-03-24 MED ORDER — TRAMADOL HCL 50 MG PO TABS: 50.0000 mg | ORAL_TABLET | Freq: Every day | ORAL | Status: DC

## 2013-03-24 NOTE — Progress Notes (Signed)
Subjective:    Patient ID: [name], male    DOB: August 16, 1934, 79 y.o.   MRN: [id number]  HPI  [name] is here regarding his left AKA. For the most part he's been doing well. He is still having stump and phantom pain. The phantom pain has been more severe. He is using his tramadol, percocet and gabapentin for the phantom limb pain.   His leg has been healing nicely until this past weekend when he began to notice some drainage. He denies bumping or scratching the incision. He saw Dr. Kellie Simmering yesterday who could find no drainage or opening.   He has been dressing the limb daily, and in fact dressed it freshly this AM.  Pain Inventory Average Pain 6 Pain Right Now 4 My pain is dull and aching  In the last 24 hours, has pain interfered with the following? General activity 0 Relation with others 0 Enjoyment of life 0 What TIME of day is your pain at its worst? evening Sleep (in general) Fair  Pain is worse with: other Pain improves with: rest and medication Relief from Meds: 6  Mobility use a walker how many minutes can you walk? 6 ability to climb steps?  no do you drive?  no use a wheelchair transfers alone Do you have any goals in this area?  yes  Function retired  Neuro/Psych No problems in this area  Prior Studies Any changes since last visit?  no  Physicians involved in your care Any changes since last visit?  no   Family History  Problem Relation Age of Onset  . Cancer      breast/fhx  . Heart disease      fhx  . Diabetes Neg Hx   . Cancer Father    History   Social History  . Marital Status: Widowed    Spouse Name: N/A    Number of Children: N/A  . Years of Education: N/A   Occupational History  . Retired    Social History Main Topics  . Smoking status: Never Smoker   . Smokeless tobacco: Never Used     Comment: 1-2 cigars when golfing  . Alcohol Use: 2.4 oz/week    2 Glasses of wine, 2 Shots of liquor per week     Comment:  07/10/2012 "galss of wine or vodka tonic 3 X/wk"  . Drug Use: No  . Sexual Activity: Not Currently   Other Topics Concern  . None   Social History Narrative   Lives in [place] with significant other,  [name].   Past Surgical History  Procedure Laterality Date  . Turp vaporization    . Cardiac defibrillator placement  12/26/09    pacemaker combo  . Cervical epidural injection  2013  . Femoral-tibial bypass graft  09/25/2011    Procedure: BYPASS GRAFT FEMORAL-TIBIAL ARTERY;  Surgeon: Mal Misty, MD;  Location: [facility] OR;  Service: Vascular;  Laterality: Left;  Left Femoral - Anterior Tibial Bypass;  saphenous vein graft from left leg  . Intraoperative arteriogram  09/25/2011    Procedure: INTRA OPERATIVE ARTERIOGRAM;  Surgeon: Mal Misty, MD;  Location: [place];  Service: Vascular;  Laterality: Left;  . Femoral-tibial bypass graft  02/07/2012    Procedure: BYPASS GRAFT FEMORAL-TIBIAL ARTERY;  Surgeon: Rosetta Posner, MD;  Location: [place];  Service: Vascular;  Laterality: Left;  Thrombectomy Left Femoral - Tibial Bypass Graft  . Embolectomy  02/07/2012    Procedure: EMBOLECTOMY;  Surgeon: Sherren Mocha  Katina Dung, MD;  Location: [place];  Service: Vascular;  Laterality: Left;  . Femoral-tibial bypass graft  04/03/2012    Procedure: BYPASS GRAFT FEMORAL-TIBIAL ARTERY;  Surgeon: Mal Misty, MD;  Location: [place];  Service: Vascular;  Laterality: Left;  Redo  . Insert / replace / remove pacemaker  2007  . Coronary angioplasty with stent placement  ~ 2000  . Coronary angioplasty    . Uvulopalatopharyngoplasty (uppp)/tonsillectomy/septoplasty  06/30/2003    Archie Endo 06/30/2003 (07/10/2012)  . Shoulder open rotator cuff repair Right 2001    repair of lacerated right/notes 10/11/1999  (07/10/2012)  . Foot surgery Right 03/20/2001    "have plates and screws in; didn't break it" (07/10/2012)  . Carpal tunnel release Right 2002    Archie Endo 03/20/2001 (07/10/2012)  . Biceps tendon repair Right 2001    Archie Endo 03/20/2001  (07/10/2012)  . Renal artery stent  2009  . Femoral-popliteal bypass graft Left 09/16/2012    Procedure: LEFT FEMORAL-POPLITEAL BYPASS GRAFT WITH GORTEX Propaten Graft 6x80 Thin Wall and Left lower leg Angiogram;  Surgeon: Mal Misty, MD;  Location: [place];  Service: Vascular;  Laterality: Left;  . Colonoscopy      Hx; of  . Tonsillectomy    . Adenoidectomy      Hx; of   . Femoral-tibial bypass graft Left 09/30/2012    Procedure: REDO LEFT FEMORAL-ANTERIOR TIBIAL ARTERY BYPASS USING COMPOSITE CEPHALIC AND BASILIC VEIN GRAFT FROM LEFT ARM;  Surgeon: Mal Misty, MD;  Location: [place];  Service: Vascular;  Laterality: Left;  . I&d extremity Left 10/21/2012    Procedure: EXPLORATION AND DEBRIDEMENT OF LEFT GROIN WOUND;  Surgeon: Mal Misty, MD;  Location: [place];  Service: Vascular;  Laterality: Left;  . Patch angioplasty Left 10/21/2012    Procedure: PATCH ANGIOPLASTY;  Surgeon: Mal Misty, MD;  Location: [place];  Service: Vascular;  Laterality: Left;  . Groin debridement Left 11/12/2012    Procedure: CLOSURE INGUINAL WOUND;  Surgeon: Mal Misty, MD;  Location: [place];  Service: Vascular;  Laterality: Left;  . Embolectomy Left 12/16/2012    Procedure: THROMBECTOMY  LEFT LEG BYPASS;  Surgeon: Elam Dutch, MD;  Location: [place];  Service: Vascular;  Laterality: Left;  . Leg amputation above knee Left 02/04/2013    DR LAWSON  . Amputation Left 02/04/2013    Procedure: AMPUTATION ABOVE KNEE-LEFT;  Surgeon: Mal Misty, MD;  Location: [place];  Service: Vascular;  Laterality: Left;   Past Medical History  Diagnosis Date  . Hyperlipidemia   . Gout   . Benign prostatic hypertrophy   . Atrial fibrillation     sees Dr. Daneen Schick  . Chronic systolic dysfunction of left ventricle     a. mixed ischemic and nonischemic CM,  EF 35%. b. s/p AICD implantation.  . CHF (congestive heart failure)   . ED (erectile dysfunction)   . Arthritis   . Pacemaker     medtronic  . CAD (coronary  artery disease)     a. s/p mid LAD stenting with DES 2008 Dr. Daneen Schick  . ICD (implantable cardiac defibrillator) in place     medtronic, Dr. Rayann Heman  . ICD (implantable cardiac defibrillator) in place     due for check in Feb/2013  . Sleep apnea     hx of "had surgery for"  . Hypertension     sees Dr. Barbie Banner  . Type II diabetes mellitus   . Automatic  implantable cardioverter-defibrillator in situ   . Depression   . Numbness and tingling     Hx; 80f left foot  . PAD (peripheral artery disease)     Severe by PV angiogram 09/2011  . Renal artery stenosis     s/p stenting 2009  . PAD (peripheral artery disease)     s/p multiple LLE bypass grafts; left mid-distal SCA occlusion by 08/2012 duplex   BP 151/66  Pulse 69  Resp 14  Ht 5\' 8"  (1.727 m)  Wt 162 lb (73.483 kg)  BMI 24.64 kg/m2  SpO2 100%  \   Review of Systems  All other systems reviewed and are negative.       Objective:   Physical Exam  Constitutional: He is oriented to person, place, and time. He appears well-developed and well-nourished.  HENT:  Head: Normocephalic and atraumatic.  Eyes: Conjunctivae are normal. Pupils are equal, round, and reactive to light.  Neck: Neck supple.  Cardiovascular: Normal rate and regular rhythm. No rubs, gallops  No murmur heard.  Respiratory: Effort normal and breath sounds normal. No respiratory distress. He has no wheezes.  GI: Soft. Bowel sounds are normal. He exhibits no distension. There is no tenderness.  Musculoskeletal:  L-AKA with normal size, minimal swelling. Area is non-tender to palpation. Neurological: He is alert and oriented to person, place, and time.  Follows commands. Strength grossly intact in UE's at 5/5. RLE is 4/5 prox to 5/5 distally. Left HF 4/5. mild sensory loss in distal right leg/foot.  Skin: Intact. Left AKA with central 0.5cm area of fibronecrotic tissue along incision which is draining fibrinous, slightly bloody fluid.  There was about a 2.5  inch spot of drainage on his dressing. Psychiatric: He has a normal mood and affect. His speech is normal and behavior is normal. Judgment and thought content normal. Cognition and memory are normal.    Assessment/Plan:  1. Functional deficits secondary to left AKA. This is patient is extremely motivated and a K3 ambulator. Will send him over to [facility] for initiation of the fitting process. 2. Pain Management: Increased gabapentin for phantom limb pain (600mg  tid)-- -prn oxycodone and tramadol were also filled today  12. Wound:   -apply dry dressing, change daily to bid for now, appears to be a small central area of fibronecrotic tissue which is draining. i reapplied dressing today.  -he may continue with his shrinker and the prosthetic fitting process for now  -no walking on a prosthesis until his wound is completely healed  -will contact Dr. Kellie Simmering as well.  Follow up with me in about 2 months and sooner if needed. . 30 minutes of face to face patient care time were spent during this visit. All questions were encouraged and answered.

## 2013-03-26 ENCOUNTER — Telehealth: Payer: Self-pay | Admitting: Family Medicine

## 2013-03-26 NOTE — Telephone Encounter (Signed)
Please set this up

## 2013-03-26 NOTE — Telephone Encounter (Signed)
Asking for Verbal orders for Occupational therapy for 3 x wk. for one week, then 2 x wk for 2 wks. Pt returning to his home from assisted living and pt lives alone

## 2013-03-26 NOTE — Telephone Encounter (Signed)
I spoke with Maudie Mercury and gave the verbal order.

## 2013-03-29 ENCOUNTER — Telehealth: Payer: Self-pay

## 2013-03-29 ENCOUNTER — Telehealth: Payer: Self-pay | Admitting: *Deleted

## 2013-03-29 NOTE — Telephone Encounter (Signed)
Pt. reports bloody drainage " the size of of a half dollar" when his dressing is changed twice daily. Pt. reports drainage from left stump incision for 2-3 weeks.   Pt. states that there is no odor/redness/inflammation.  Denies fever/chills. Appointment with nurse practitioner made for tomorrow (03-30-2013) at [place].  Pt. aware of of appointment date and time.

## 2013-03-29 NOTE — Telephone Encounter (Signed)
Patient said his amputee is still draining. He wanted to know what he should do?

## 2013-03-29 NOTE — Telephone Encounter (Signed)
My recommendation would be to contact Dr. Serena Croissant current dressings for now (change twice daily if needed). I did contact Dr. Kellie Simmering last week regarding the drainage and he stated he would re-check it in a few weeks (paraphrasing)

## 2013-03-29 NOTE — Telephone Encounter (Signed)
I spoke with Mr Bischof.  He has called Dr Evelena Leyden office and left a message.  I explained that they will return his call.  He needs to be seen by the surgeon and not Dr Naaman Plummer for this problem.  His scheduled appt is in April with Dr Kellie Simmering but they need to see him now for this and not wait.  He will wait for their call back.

## 2013-03-30 ENCOUNTER — Encounter: Payer: Self-pay | Admitting: Family

## 2013-03-30 ENCOUNTER — Ambulatory Visit (INDEPENDENT_AMBULATORY_CARE_PROVIDER_SITE_OTHER): Payer: Medicare Other | Admitting: Family

## 2013-03-30 ENCOUNTER — Encounter: Payer: Medicare Other | Admitting: Vascular Surgery

## 2013-03-30 VITALS — BP 145/79 | HR 72 | Temp 97.5°F | Resp 16 | Ht 68.0 in | Wt 162.0 lb

## 2013-03-30 DIAGNOSIS — Z48812 Encounter for surgical aftercare following surgery on the circulatory system: Secondary | ICD-10-CM

## 2013-03-30 DIAGNOSIS — T148XXA Other injury of unspecified body region, initial encounter: Secondary | ICD-10-CM

## 2013-03-30 DIAGNOSIS — L24A9 Irritant contact dermatitis due friction or contact with other specified body fluids: Secondary | ICD-10-CM

## 2013-03-30 DIAGNOSIS — I739 Peripheral vascular disease, unspecified: Secondary | ICD-10-CM

## 2013-03-30 NOTE — Patient Instructions (Signed)
Peripheral Vascular Disease Peripheral Vascular Disease (PVD), also called Peripheral Arterial Disease (PAD), is a circulation problem caused by cholesterol (atherosclerotic plaque) deposits in the arteries. PVD commonly occurs in the lower extremities (legs) but it can occur in other areas of the body, such as your arms. The cholesterol buildup in the arteries reduces blood flow which can cause pain and other serious problems. The presence of PVD can place a person at risk for Coronary Artery Disease (CAD).  CAUSES  Causes of PVD can be many. It is usually associated with more than one risk factor such as:   High Cholesterol.  Smoking.  Diabetes.  Lack of exercise or inactivity.  High blood pressure (hypertension).  Obesity.  Family history. SYMPTOMS   When the lower extremities are affected, patients with PVD may experience:  Leg pain with exertion or physical activity. This is called INTERMITTENT CLAUDICATION. This may present as cramping or numbness with physical activity. The location of the pain is associated with the level of blockage. For example, blockage at the abdominal level (distal abdominal aorta) may result in buttock or hip pain. Lower leg arterial blockage may result in calf pain.  As PVD becomes more severe, pain can develop with less physical activity.  In people with severe PVD, leg pain may occur at rest.  Other PVD signs and symptoms:  Leg numbness or weakness.  Coldness in the affected leg or foot, especially when compared to the other leg.  A change in leg color.  Patients with significant PVD are more prone to ulcers or sores on toes, feet or legs. These may take longer to heal or may reoccur. The ulcers or sores can become infected.  If signs and symptoms of PVD are ignored, gangrene may occur. This can result in the loss of toes or loss of an entire limb.  Not all leg pain is related to PVD. Other medical conditions can cause leg pain such  as:  Blood clots (embolism) or Deep Vein Thrombosis.  Inflammation of the blood vessels (vasculitis).  Spinal stenosis. DIAGNOSIS  Diagnosis of PVD can involve several different types of tests. These can include:  Pulse Volume Recording Method (PVR). This test is simple, painless and does not involve the use of X-rays. PVR involves measuring and comparing the blood pressure in the arms and legs. An ABI (Ankle-Brachial Index) is calculated. The normal ratio of blood pressures is 1. As this number becomes smaller, it indicates more severe disease.  < 0.95  indicates significant narrowing in one or more leg vessels.  <0.8 there will usually be pain in the foot, leg or buttock with exercise.  <0.4 will usually have pain in the legs at rest.  <0.25  usually indicates limb threatening PVD.  Doppler detection of pulses in the legs. This test is painless and checks to see if you have a pulses in your legs/feet.  A dye or contrast material (a substance that highlights the blood vessels so they show up on x-ray) may be given to help your caregiver better see the arteries for the following tests. The dye is eliminated from your body by the kidney's. Your caregiver may order blood work to check your kidney function and other laboratory values before the following tests are performed:  Magnetic Resonance Angiography (MRA). An MRA is a picture study of the blood vessels and arteries. The MRA machine uses a large magnet to produce images of the blood vessels.  Computed Tomography Angiography (CTA). A CTA is a   specialized x-ray that looks at how the blood flows in your blood vessels. An IV may be inserted into your arm so contrast dye can be injected.  Angiogram. Is a procedure that uses x-rays to look at your blood vessels. This procedure is minimally invasive, meaning a small incision (cut) is made in your groin. A small tube (catheter) is then inserted into the artery of your groin. The catheter is  guided to the blood vessel or artery your caregiver wants to examine. Contrast dye is injected into the catheter. X-rays are then taken of the blood vessel or artery. After the images are obtained, the catheter is taken out. TREATMENT  Treatment of PVD involves many interventions which may include:  Lifestyle changes:  Quitting smoking.  Exercise.  Following a low fat, low cholesterol diet.  Control of diabetes.  Foot care is very important to the PVD patient. Good foot care can help prevent infection.  Medication:  Cholesterol-lowering medicine.  Blood pressure medicine.  Anti-platelet drugs.  Certain medicines may reduce symptoms of Intermittent Claudication.  Interventional/Surgical options:  Angioplasty. An Angioplasty is a procedure that inflates a balloon in the blocked artery. This opens the blocked artery to improve blood flow.  Stent Implant. A wire mesh tube (stent) is placed in the artery. The stent expands and stays in place, allowing the artery to remain open.  Peripheral Bypass Surgery. This is a surgical procedure that reroutes the blood around a blocked artery to help improve blood flow. This type of procedure may be performed if Angioplasty or stent implants are not an option. SEEK IMMEDIATE MEDICAL CARE IF:   You develop pain or numbness in your arms or legs.  Your arm or leg turns cold, becomes blue in color.  You develop redness, warmth, swelling and pain in your arms or legs. MAKE SURE YOU:   Understand these instructions.  Will watch your condition.  Will get help right away if you are not doing well or get worse. Document Released: 03/28/2004 Document Revised: 05/13/2011 Document Reviewed: 02/23/2008 Exit[employer] Patient Information 2014 [employer].  

## 2013-03-30 NOTE — Progress Notes (Signed)
[facility] [place] HISTORY AND PHYSICAL -PAD   History of Present Illness [name] is a 79 y.o. male patient of Dr. Hart Rochester who is s/p left AKA Feb 04, 2013, he returns today for complaint of bloody drainage from incision.  He denies fever or chills.  He denies rest pain, denies non-healing wounds, the lesion on inner aspect right heel has healed.  Has phantom pain in left leg which he states is under good control.  Taking coumadin.  He has been wearing knee high graduated compression hose as advised and states the swelling in his right lower leg has decreased significantly.  Pt Diabetic: Yes, patient reports controlled  Pt smoker: non-smoker    Past Medical History  Diagnosis Date  . Hyperlipidemia   . Gout   . Benign prostatic hypertrophy   . Atrial fibrillation     sees Dr. Verdis Prime  . Chronic systolic dysfunction of left ventricle     a. mixed ischemic and nonischemic CM,  EF 35%. b. s/p AICD implantation.  . CHF (congestive heart failure)   . ED (erectile dysfunction)   . Arthritis   . Pacemaker     medtronic  . CAD (coronary artery disease)     a. s/p mid LAD stenting with DES 2008 Dr. Verdis Prime  . ICD (implantable cardiac defibrillator) in place     medtronic, Dr. Johney Frame  . ICD (implantable cardiac defibrillator) in place     due for check in Feb/2013  . Sleep apnea     hx of "had surgery for"  . Hypertension     sees Dr. Claris Che  . Type II diabetes mellitus   . Automatic implantable cardioverter-defibrillator in situ   . Depression   . Numbness and tingling     Hx; 35f left foot  . PAD (peripheral artery disease)     Severe by PV angiogram 09/2011  . Renal artery stenosis     s/p stenting 2009  . PAD (peripheral artery disease)     s/p multiple LLE bypass grafts; left mid-distal SCA occlusion by 08/2012 duplex    Social History History  Substance Use Topics  . Smoking status: Never Smoker   . Smokeless tobacco: Never Used      Comment: 1-2 cigars when golfing  . Alcohol Use: 2.4 oz/week    2 Glasses of wine, 2 Shots of liquor per week     Comment: 07/10/2012 "galss of wine or vodka tonic 3 X/wk"    Family History Family History  Problem Relation Age of Onset  . Cancer      breast/fhx  . Heart disease      fhx  . Diabetes Neg Hx   . Cancer Father     Past Surgical History  Procedure Laterality Date  . Turp vaporization    . Cardiac defibrillator placement  12/26/09    pacemaker combo  . Cervical epidural injection  2013  . Femoral-tibial bypass graft  09/25/2011    Procedure: BYPASS GRAFT FEMORAL-TIBIAL ARTERY;  Surgeon: Pryor Ochoa, MD;  Location: [facility] OR;  Service: Vascular;  Laterality: Left;  Left Femoral - Anterior Tibial Bypass;  saphenous vein graft from left leg  . Intraoperative arteriogram  09/25/2011    Procedure: INTRA OPERATIVE ARTERIOGRAM;  Surgeon: Pryor Ochoa, MD;  Location: [facility] OR;  Service: Vascular;  Laterality: Left;  . Femoral-tibial bypass graft  02/07/2012    Procedure: BYPASS GRAFT FEMORAL-TIBIAL ARTERY;  Surgeon: Tawanna Cooler  Katina Dung, MD;  Location: [place];  Service: Vascular;  Laterality: Left;  Thrombectomy Left Femoral - Tibial Bypass Graft  . Embolectomy  02/07/2012    Procedure: EMBOLECTOMY;  Surgeon: Rosetta Posner, MD;  Location: [place];  Service: Vascular;  Laterality: Left;  . Femoral-tibial bypass graft  04/03/2012    Procedure: BYPASS GRAFT FEMORAL-TIBIAL ARTERY;  Surgeon: Mal Misty, MD;  Location: [place];  Service: Vascular;  Laterality: Left;  Redo  . Insert / replace / remove pacemaker  2007  . Coronary angioplasty with stent placement  ~ 2000  . Coronary angioplasty    . Uvulopalatopharyngoplasty (uppp)/tonsillectomy/septoplasty  06/30/2003    Archie Endo 06/30/2003 (07/10/2012)  . Shoulder open rotator cuff repair Right 2001    repair of lacerated right/notes 10/11/1999  (07/10/2012)  . Foot surgery Right 03/20/2001    "have plates and screws in; didn't break it" (07/10/2012)  .  Carpal tunnel release Right 2002    Archie Endo 03/20/2001 (07/10/2012)  . Biceps tendon repair Right 2001    Archie Endo 03/20/2001 (07/10/2012)  . Renal artery stent  2009  . Femoral-popliteal bypass graft Left 09/16/2012    Procedure: LEFT FEMORAL-POPLITEAL BYPASS GRAFT WITH GORTEX Propaten Graft 6x80 Thin Wall and Left lower leg Angiogram;  Surgeon: Mal Misty, MD;  Location: [place];  Service: Vascular;  Laterality: Left;  . Colonoscopy      Hx; of  . Tonsillectomy    . Adenoidectomy      Hx; of   . Femoral-tibial bypass graft Left 09/30/2012    Procedure: REDO LEFT FEMORAL-ANTERIOR TIBIAL ARTERY BYPASS USING COMPOSITE CEPHALIC AND BASILIC VEIN GRAFT FROM LEFT ARM;  Surgeon: Mal Misty, MD;  Location: [place];  Service: Vascular;  Laterality: Left;  . I&d extremity Left 10/21/2012    Procedure: EXPLORATION AND DEBRIDEMENT OF LEFT GROIN WOUND;  Surgeon: Mal Misty, MD;  Location: [place];  Service: Vascular;  Laterality: Left;  . Patch angioplasty Left 10/21/2012    Procedure: PATCH ANGIOPLASTY;  Surgeon: Mal Misty, MD;  Location: [place];  Service: Vascular;  Laterality: Left;  . Groin debridement Left 11/12/2012    Procedure: CLOSURE INGUINAL WOUND;  Surgeon: Mal Misty, MD;  Location: [place];  Service: Vascular;  Laterality: Left;  . Embolectomy Left 12/16/2012    Procedure: THROMBECTOMY  LEFT LEG BYPASS;  Surgeon: Elam Dutch, MD;  Location: [place];  Service: Vascular;  Laterality: Left;  . Leg amputation above knee Left 02/04/2013    DR LAWSON  . Amputation Left 02/04/2013    Procedure: AMPUTATION ABOVE KNEE-LEFT;  Surgeon: Mal Misty, MD;  Location: [place];  Service: Vascular;  Laterality: Left;    Allergies  Allergen Reactions  . Other Other (See Comments)    Plastic tape tears skin    Current Outpatient Prescriptions  Medication Sig Dispense Refill  . carvedilol (COREG) 6.25 MG tablet Take 1 tablet (6.25 mg total) by mouth 2 (two) times daily with a meal.  180 tablet  3   . Docusate Sodium (DSS) 100 MG CAPS Take 100 mg by mouth as needed. Stool softner--over there counter.  30 each  3  . finasteride (PROSCAR) 5 MG tablet Take 5 mg by mouth daily.      . furosemide (LASIX) 40 MG tablet Take 1 tablet (40 mg total) by mouth daily.  90 tablet  3  . gabapentin (NEURONTIN) 400 MG capsule Take 1 capsule (400 mg total) by mouth daily. 1  tab in the am and 2 in the evening  120 capsule  1  . insulin aspart protamine- aspart (NOVOLOG MIX 70/30) (70-30) 100 UNIT/ML injection Inject 7 Units into the skin 2 (two) times daily with a meal.      . losartan (COZAAR) 100 MG tablet Take 1 tablet (100 mg total) by mouth daily.  90 tablet  3  . NITROSTAT 0.4 MG SL tablet Place 0.4 mg under the tongue every 5 (five) minutes as needed for chest pain. For chest pain, max 3 doses      . oxyCODONE-acetaminophen (PERCOCET/ROXICET) 5-325 MG per tablet Take 1 tablet by mouth every 6 (six) hours as needed for severe pain.  100 tablet  0  . polyethylene glycol (MIRALAX / GLYCOLAX) packet Take 17 g by mouth daily. For constipation--available over the counter in a bottle      . traMADol (ULTRAM) 50 MG tablet Take 1 tablet (50 mg total) by mouth at bedtime. For neuropathy  30 tablet  1  . traZODone (DESYREL) 50 MG tablet       . warfarin (COUMADIN) 2.5 MG tablet Take 1 tablet (2.5 mg total) by mouth daily.  30 tablet  2   No current facility-administered medications for this visit.    ROS: See HPI for pertinent positives and negatives.   Physical Examination  Filed Vitals:   03/30/13 0919  BP: 145/79  Pulse: 72  Temp: 97.5 F (36.4 C)  Resp: 16   Filed Weights   03/30/13 0919  Weight: 162 lb (73.483 kg)   Body mass index is 24.64 kg/(m^2).   General: A&O x 3, WDWN,  Gait: in wheelchair  Eyes: PERRLA,  Pulmonary: CTAB, without wheezes , rales or rhonchi  Cardiac: regular Rythm , without murmur   Carotid Bruits  Left  Right    Negative  Negative   Radial pulses: Radial  pulses: left radial and brachial not palpable, right radial is faintly palpable  VASCULAR EXAM:  Extremities without ischemic changes  without Gangrene; without open wounds. Left AKA incision edges are well proximated, no redness, no swelling, small amount of serous oozing from pinhole drainage site at incision. Wearing graduated knee high compression hose on right leg, trace pretibial pitting edema, no ulcers.   LE PULSES LEFT RIGHT  FEMORAL Palpable palpable   POPLITEAL  AKA  not palpable   POSTERIOR TIBIAL  AKA  not palpable   DORSALIS PEDIS  ANTERIOR TIBIAL  AKA  not palpable    Abdomen: soft, NT, no masses.  Skin: no rashes, no ulcers noted.  Musculoskeletal: no muscle wasting or atrophy. See extremities exam above.  Neurologic: A&O X 3; Appropriate Affect ; SENSATION: normal; MOTOR FUNCTION: moving all extremities equally, motor strength 5/5 throughout. Speech is fluent/normal. CN 2-12 intact.    ASSESSMENT: [name] is a 79 y.o. male who is s/p left AKA Feb 04, 2013, and has a history of multiple lower extremity arterial vascular procedures.  His left stump incision is well healed, no s/s of infection.  He is at home, after getting rehab services in a rehab center, states he is managing fairly well. He has been wearing the stump shrinker and the stump swelling has decreased from previous visit; the serous oozing is likely a result of the stump shrinker minimizing the swelling.  His right lower leg swelling has significantly decreased with use of the graduated knee high compression hose. Dr. Kellie Simmering examined pt and thinks his stump is not infected,  that this is likely part of the normal healing process.   Previous: Right ABI is 0.77, moderate arterial occlusive disease.  >50% stenosis in the right CFA and prox. FA and mid FA.   PLAN:  I discussed in depth with the patient the nature of atherosclerosis, and emphasized the importance of maximal medical management including  strict control of blood pressure, blood glucose, and lipid levels, obtaining regular exercise, and cessation of smoking.  The patient is aware that without maximal medical management the underlying atherosclerotic disease process will progress, limiting the benefit of any interventions. Based on the patient's vascular studies and examination, pt will return to clinic in 6 months for RLE ABI.  The patient was given information about PAD including signs, symptoms, treatment, what symptoms should prompt the patient to seek immediate medical care, and risk reduction measures to take.  Clemon Chambers, RN, MSN, FNP-C [facility] Phone: [phone]  Clinic MD: Kellie Simmering  03/30/2013 9:15 AM

## 2013-03-30 NOTE — Addendum Note (Signed)
Addended by: Dorthula Rue L on: 03/30/2013 03:37 PM   Modules accepted: Orders

## 2013-04-05 ENCOUNTER — Telehealth: Payer: Self-pay | Admitting: Family Medicine

## 2013-04-05 ENCOUNTER — Encounter: Payer: Self-pay | Admitting: Vascular Surgery

## 2013-04-05 ENCOUNTER — Encounter: Payer: Self-pay | Admitting: Interventional Cardiology

## 2013-04-05 ENCOUNTER — Encounter: Payer: Self-pay | Admitting: Internal Medicine

## 2013-04-05 ENCOUNTER — Encounter: Payer: Self-pay | Admitting: Family Medicine

## 2013-04-05 NOTE — Telephone Encounter (Signed)
Script is ready, I spoke with pt and would like for [place] to fax it to [phone], which I did.

## 2013-04-05 NOTE — Telephone Encounter (Signed)
done

## 2013-04-05 NOTE — Telephone Encounter (Signed)
Pt needs a RX for Diabetic Shoes.  He is going to [place] on Wednesday.

## 2013-04-06 ENCOUNTER — Encounter: Payer: Self-pay | Admitting: Family Medicine

## 2013-04-06 ENCOUNTER — Telehealth: Payer: Self-pay | Admitting: Internal Medicine

## 2013-04-06 ENCOUNTER — Telehealth: Payer: Self-pay | Admitting: Family Medicine

## 2013-04-06 NOTE — Telephone Encounter (Signed)
Follow Up    Pt is calling following up on a filter he was supposed to receive to enable him to transmit. Please call.

## 2013-04-06 NOTE — Telephone Encounter (Signed)
I need more information about this. I have no idea what they are looking for. If they want to send me a fax to fill out I will do so

## 2013-04-06 NOTE — Telephone Encounter (Signed)
LMOM for return call//kwm  

## 2013-04-06 NOTE — Telephone Encounter (Signed)
Pt needs a letter for [employer] just stating his health condition at this time. Call pt when ready for pick up.

## 2013-04-07 NOTE — Telephone Encounter (Signed)
I spoke with pt and he is going to fax the letter to [place] that we need to fill out.

## 2013-04-09 ENCOUNTER — Other Ambulatory Visit: Payer: Self-pay | Admitting: General Practice

## 2013-04-09 ENCOUNTER — Other Ambulatory Visit: Payer: Self-pay | Admitting: Family Medicine

## 2013-04-09 ENCOUNTER — Other Ambulatory Visit: Payer: Self-pay | Admitting: Physical Medicine & Rehabilitation

## 2013-04-09 MED ORDER — WARFARIN SODIUM 2.5 MG PO TABS: ORAL_TABLET | ORAL | Status: DC

## 2013-04-13 ENCOUNTER — Other Ambulatory Visit: Payer: Self-pay | Admitting: Vascular Surgery

## 2013-04-13 ENCOUNTER — Ambulatory Visit (INDEPENDENT_AMBULATORY_CARE_PROVIDER_SITE_OTHER): Payer: Medicare Other | Admitting: Vascular Surgery

## 2013-04-13 ENCOUNTER — Encounter: Payer: Self-pay | Admitting: *Deleted

## 2013-04-13 ENCOUNTER — Other Ambulatory Visit: Payer: Self-pay | Admitting: *Deleted

## 2013-04-13 ENCOUNTER — Encounter: Payer: Self-pay | Admitting: Vascular Surgery

## 2013-04-13 DIAGNOSIS — T8740 Infection of amputation stump, unspecified extremity: Secondary | ICD-10-CM | POA: Insufficient documentation

## 2013-04-13 NOTE — Progress Notes (Signed)
Subjective:     Patient ID: [name], male   DOB: 11/18/1934, 79 y.o.   MRN: [id number]  HPI this 79-year-old male is well known to me having had multiple attempts at revascularization left leg for limb salvage. 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 Since that time he has had some periodic drainage from a small sinus tract in the left AKA stump which was otherwise well healed. He did have an old thrombosed Gore-Tex graft where an attempted bypass had been unsuccessful and this was removed from the distal end of the agitation stunt during the procedure and was disconnected from the left common femoral artery at a subsequent procedure. Patient has had no infection or drainage from the proximal aspect of the left amputation stump near the inguinal area. This is old and well healed.  Past Medical History  Diagnosis Date  . Hyperlipidemia   . Gout   . Benign prostatic hypertrophy   . Atrial fibrillation     sees Dr. Henry Smith  . Chronic systolic dysfunction of left ventricle     a. mixed ischemic and nonischemic CM,  EF 35%. b. s/p AICD implantation.  . CHF (congestive heart failure)   . ED (erectile dysfunction)   . Arthritis   . Pacemaker     medtronic  . CAD (coronary artery disease)     a. s/p mid LAD stenting with DES 2008 Dr. Henry Smith  . ICD (implantable cardiac defibrillator) in place     [employer]onic, Dr. Allred  . ICD (implantable cardiac defibrillator) in place     due for check in Feb/2013  . Sleep apnea     hx of "had surgery for"  . Hypertension     sees Dr. S. Fry  . Type II diabetes mellitus   . Automatic implantable cardioverter-defibrillator in situ   . Depression   . Numbness and tingling     Hx; 0f left foot  . PAD (peripheral artery disease)     Severe by PV angiogram 09/2011  . Renal artery stenosis     s/p stenting 2009  . PAD (peripheral artery disease)     s/p multiple LLE bypass grafts; left mid-distal SCA occlusion  by 08/2012 duplex    History  Substance Use Topics  . Smoking status: Never Smoker   . Smokeless tobacco: Never Used     Comment: 1-2 cigars when golfing  . Alcohol Use: 2.4 oz/week    2 Glasses of wine, 2 Shots of liquor per week     Comment: 07/10/2012 "galss of wine or vodka tonic 3 X/wk"    Family History  Problem Relation Age of Onset  . Cancer      breast/fhx  . Heart disease      fhx  . Diabetes Neg Hx   . Cancer Father     Allergies  Allergen Reactions  . Other Other (See Comments)    Plastic tape tears skin    Current outpatient prescriptions:carvedilol (COREG) 6.25 MG tablet, Take 1 tablet (6.25 mg total) by mouth 2 (two) times daily with a meal., Disp: 180 tablet, Rfl: 3;  Docusate Sodium (DSS) 100 MG CAPS, Take 100 mg by mouth as needed. Stool softner--over there counter., Disp: 30 each, Rfl: 3;  finasteride (PROSCAR) 5 MG tablet, Take 5 mg by mouth daily., Disp: , Rfl:  furosemide (LASIX) 40 MG tablet, Take 1 tablet (40 mg total) by mouth daily., Disp: 90 tablet, Rfl: 3;    gabapentin (NEURONTIN) 400 MG capsule, Take 1 capsule (400 mg total) by mouth daily. 1 tab in the am and 2 in the evening, Disp: 120 capsule, Rfl: 1;  insulin aspart protamine- aspart (NOVOLOG MIX 70/30) (70-30) 100 UNIT/ML injection, Inject 7 Units into the skin 2 (two) times daily with a meal., Disp: , Rfl:  losartan (COZAAR) 100 MG tablet, Take 1 tablet (100 mg total) by mouth daily., Disp: 90 tablet, Rfl: 3;  NITROSTAT 0.4 MG SL tablet, Place 0.4 mg under the tongue every 5 (five) minutes as needed for chest pain. For chest pain, max 3 doses, Disp: , Rfl: ;  oxyCODONE-acetaminophen (PERCOCET/ROXICET) 5-325 MG per tablet, Take 1 tablet by mouth every 6 (six) hours as needed for severe pain., Disp: 100 tablet, Rfl: 0 polyethylene glycol (MIRALAX / GLYCOLAX) packet, Take 17 g by mouth daily. For constipation--available over the counter in a bottle, Disp: , Rfl: ;  traMADol (ULTRAM) 50 MG tablet, Take 1  tablet (50 mg total) by mouth at bedtime. For neuropathy, Disp: 30 tablet, Rfl: 1;  traZODone (DESYREL) 50 MG tablet, , Disp: , Rfl: ;  warfarin (COUMADIN) 2.5 MG tablet, Take as directed by anticoagulation clinic, Disp: 35 tablet, Rfl: 3  There were no vitals taken for this visit.  There is no weight on file to calculate BMI.           Review of Systems dissected chest pain, dyspnea on exertion, PND, orthopnea, hemoptysis. Patient has known pacemaker and AICD in left subclavian vein area patient on chronic Coumadin for A. fib.     Objective:   Physical Exam There were no vitals taken for this visit.  Gen.-alert and oriented x3 in no apparent distress HEENT normal for age Lungs no rhonchi or wheezing Cardiovascular regular rhythm no murmurs carotid pulses 3+ palpable no bruits audible Abdomen soft nontender no palpable masses Musculoskeletal free of  major deformities Skin clear -no rashes Neurologic normal Lower extremities left AKA with 2+ femoral pulse palpable. Small sinus tract in the midportion of stump is not inflamed. This was explored with swab and will enter stump approximately 6 cm or so with some bloody purulent drainage. This was cultured. No fluctuance noted in stump itself. Right lower extremity free of ischemic changes       Assessment:     Persistent drainage from left AKA stump-possibly from residual Gore-Tex graft which was chronically thrombosed and was disconnected from left common femoral artery at most recent operative procedure    Plan:     #1 vitamin K 3 mg orally today #2 hold Coumadin #3 we'll explore the left AKA stump on Friday, February 13 2 see if residual Gore-Tex is the problem or if other area needs further debridement or drainage. Discussed this with the patient and his son and they would like to proceed

## 2013-04-14 ENCOUNTER — Telehealth: Payer: Self-pay

## 2013-04-14 ENCOUNTER — Encounter (HOSPITAL_COMMUNITY): Payer: Self-pay | Admitting: Pharmacy Technician

## 2013-04-14 NOTE — Telephone Encounter (Signed)
[name] from [facility] called requesting verbal orders for pt.  He is having surgery on his stump due to infection.  Ok per Dr. Sarajane Jews to give verbal orders for nursing visits only.  The surgeon will have to give orders for PT.  [name] is aware.

## 2013-04-15 ENCOUNTER — Ambulatory Visit: Payer: Medicare Other

## 2013-04-15 ENCOUNTER — Encounter (HOSPITAL_COMMUNITY): Payer: Self-pay

## 2013-04-15 ENCOUNTER — Encounter (HOSPITAL_COMMUNITY)
Admission: RE | Admit: 2013-04-15 | Discharge: 2013-04-15 | Disposition: A | Discharge status: Home / Self Care | Payer: Medicare Other | Source: Ambulatory Visit | Attending: Vascular Surgery | Admitting: Vascular Surgery

## 2013-04-15 ENCOUNTER — Ambulatory Visit (HOSPITAL_COMMUNITY)
Admission: RE | Admit: 2013-04-15 | Discharge: 2013-04-15 | Disposition: A | Discharge status: Home / Self Care | Payer: Medicare Other | Source: Ambulatory Visit | Attending: Anesthesiology | Admitting: Anesthesiology

## 2013-04-15 DIAGNOSIS — Z01812 Encounter for preprocedural laboratory examination: Secondary | ICD-10-CM | POA: Insufficient documentation

## 2013-04-15 DIAGNOSIS — Z01818 Encounter for other preprocedural examination: Secondary | ICD-10-CM | POA: Insufficient documentation

## 2013-04-15 DIAGNOSIS — Z950 Presence of cardiac pacemaker: Secondary | ICD-10-CM | POA: Insufficient documentation

## 2013-04-15 HISTORY — DX: Personal history of Methicillin resistant Staphylococcus aureus infection: Z86.14

## 2013-04-15 HISTORY — DX: Insomnia, unspecified: G47.00

## 2013-04-15 HISTORY — DX: Constipation, unspecified: K59.00

## 2013-04-15 LAB — BASIC METABOLIC PANEL
BUN: 40 mg/dL — AB (ref 6–23)
CO2: 21 mEq/L (ref 19–32)
Calcium: 9.3 mg/dL (ref 8.4–10.5)
Chloride: 106 mEq/L (ref 96–112)
Creatinine, Ser: 1.71 mg/dL — ABNORMAL HIGH (ref 0.50–1.35)
GFR, EST AFRICAN AMERICAN: 43 mL/min — AB (ref >90)
GFR, EST NON AFRICAN AMERICAN: 37 mL/min — AB (ref >90)
Glucose, Bld: 90 mg/dL (ref 70–99)
Potassium: 5.2 mEq/L (ref 3.7–5.3)
SODIUM: 139 meq/L (ref 137–147)

## 2013-04-15 LAB — CBC
HCT: 38.4 % — ABNORMAL LOW (ref 39.0–52.0)
Hemoglobin: 11.7 g/dL — ABNORMAL LOW (ref 13.0–17.0)
MCH: 23.2 pg — AB (ref 26.0–34.0)
MCHC: 30.5 g/dL (ref 30.0–36.0)
MCV: 76 fL — ABNORMAL LOW (ref 78.0–100.0)
PLATELETS: 212 10*3/uL (ref 150–400)
RBC: 5.05 MIL/uL (ref 4.22–5.81)
RDW: 15.8 % — ABNORMAL HIGH (ref 11.5–15.5)
WBC: 7.8 10*3/uL (ref 4.0–10.5)

## 2013-04-15 LAB — PROTIME-INR
INR: 1.28 (ref 0.00–1.49)
Prothrombin Time: 15.7 seconds — ABNORMAL HIGH (ref 11.6–15.2)

## 2013-04-15 LAB — APTT: aPTT: 33 seconds (ref 24–37)

## 2013-04-15 MED ORDER — DEXTROSE 5 % IV SOLN
1.5000 g | INTRAVENOUS | Status: AC
  Administered 2013-04-16: 1.5 g via INTRAVENOUS
  Filled 2013-04-15: qty 1.5

## 2013-04-15 NOTE — Progress Notes (Signed)
Average fasting blood sugar runs 135  Cardiologist is Dr.Henry Tamala Julian with last visit in Oct 2014  Stress test done > 43yrs ago  Denies ever having a heart cath  Echo done about 60months ago  Medical Md is Dr.Frye   EKG in epic from 02-01-13  Denies CXR in past yr

## 2013-04-15 NOTE — Progress Notes (Signed)
Spoke with Tomi Bamberger rep with [employer] to let her know date and time pt is scheduled for surgery and will call if needed

## 2013-04-15 NOTE — Pre-Procedure Instructions (Signed)
SHAHEER BONFIELD  04/15/2013   Your procedure is scheduled on:  Fri, Feb 13 @ 9:20 AM  Report to [facility]trance A at 7:15 AM  Call this number if you have problems the morning of surgery: [phone]   Remember:   Do not eat food or drink liquids after midnight.   Take these medicines the morning of surgery with A SIP OF WATER: Carvedilol(Coreg),Proscar(Finasteride),Gabapentin(Neurontin), and Pain Pill(if needed)              Stop taking your Coumadin as instructed. No Goody's,BC's,Aleve,Aspirin,Ibuprofen,Fish Oil,or any Herbal Medications   Do not wear jewelry  Do not wear lotions, powders, or colognes. You may wear deodorant.  Men may shave face and neck.  Do not bring valuables to the hospital.  [facility] is not responsible                  for any belongings or valuables.               Contacts, dentures or bridgework may not be worn into surgery.  Leave suitcase in the car. After surgery it may be brought to your room.  For patients admitted to the hospital, discharge time is determined by your                treatment team.                Special Instructions:  [place] - Preparing for Surgery  Before surgery, you can play an important role.  Because skin is not sterile, your skin needs to be as free of germs as possible.  You can reduce the number of germs on you skin by washing with CHG (chlorahexidine gluconate) soap before surgery.  CHG is an antiseptic cleaner which kills germs and bonds with the skin to continue killing germs even after washing.  Please DO NOT use if you have an allergy to CHG or antibacterial soaps.  If your skin becomes reddened/irritated stop using the CHG and inform your nurse when you arrive at Short Stay.  Do not shave (including legs and underarms) for at least 48 hours prior to the first CHG shower.  You may shave your face.  Please follow these instructions carefully:   1.  Shower with CHG Soap the night before surgery and  the                                morning of Surgery.  2.  If you choose to wash your hair, wash your hair first as usual with your       normal shampoo.  3.  After you shampoo, rinse your hair and body thoroughly to remove the                      Shampoo.  4.  Use CHG as you would any other liquid soap.  You can apply chg directly       to the skin and wash gently with scrungie or a clean washcloth.  5.  Apply the CHG Soap to your body ONLY FROM THE NECK DOWN.        Do not use on open wounds or open sores.  Avoid contact with your eyes,       ears, mouth and genitals (private parts).  Wash genitals (private parts)       with your normal  soap.  6.  Wash thoroughly, paying special attention to the area where your surgery        will be performed.  7.  Thoroughly rinse your body with warm water from the neck down.  8.  DO NOT shower/wash with your normal soap after using and rinsing off       the CHG Soap.  9.  Pat yourself dry with a clean towel.            10.  Wear clean pajamas.            11.  Place clean sheets on your bed the night of your first shower and do not        sleep with pets.  Day of Surgery  Do not apply any lotions/deoderants the morning of surgery.  Please wear clean clothes to the hospital/surgery center.     Please read over the following fact sheets that you were given: Pain Booklet, Coughing and Deep Breathing and Surgical Site Infection Prevention

## 2013-04-16 ENCOUNTER — Inpatient Hospital Stay (HOSPITAL_COMMUNITY): Payer: Medicare Other | Admitting: Anesthesiology

## 2013-04-16 ENCOUNTER — Encounter (HOSPITAL_COMMUNITY): Payer: Medicare Other | Admitting: Anesthesiology

## 2013-04-16 ENCOUNTER — Encounter (HOSPITAL_COMMUNITY)
Admission: RE | Disposition: A | Discharge status: Home Health | Payer: Self-pay | Source: Ambulatory Visit | Attending: Vascular Surgery

## 2013-04-16 ENCOUNTER — Inpatient Hospital Stay (HOSPITAL_COMMUNITY)
Admission: RE | Admit: 2013-04-16 | Discharge: 2013-04-19 | DRG: 566 | Disposition: A | Discharge status: Home Health | Payer: Medicare Other | Source: Ambulatory Visit | Attending: Vascular Surgery | Admitting: Vascular Surgery

## 2013-04-16 DIAGNOSIS — Z01812 Encounter for preprocedural laboratory examination: Secondary | ICD-10-CM

## 2013-04-16 DIAGNOSIS — I129 Hypertensive chronic kidney disease with stage 1 through stage 4 chronic kidney disease, or unspecified chronic kidney disease: Secondary | ICD-10-CM | POA: Diagnosis present

## 2013-04-16 DIAGNOSIS — L089 Local infection of the skin and subcutaneous tissue, unspecified: Secondary | ICD-10-CM | POA: Diagnosis present

## 2013-04-16 DIAGNOSIS — E119 Type 2 diabetes mellitus without complications: Secondary | ICD-10-CM | POA: Diagnosis present

## 2013-04-16 DIAGNOSIS — Z01818 Encounter for other preprocedural examination: Secondary | ICD-10-CM

## 2013-04-16 DIAGNOSIS — Z794 Long term (current) use of insulin: Secondary | ICD-10-CM

## 2013-04-16 DIAGNOSIS — Z95810 Presence of automatic (implantable) cardiac defibrillator: Secondary | ICD-10-CM

## 2013-04-16 DIAGNOSIS — T148XXA Other injury of unspecified body region, initial encounter: Secondary | ICD-10-CM

## 2013-04-16 DIAGNOSIS — T8740 Infection of amputation stump, unspecified extremity: Principal | ICD-10-CM | POA: Diagnosis present

## 2013-04-16 DIAGNOSIS — Z7901 Long term (current) use of anticoagulants: Secondary | ICD-10-CM

## 2013-04-16 DIAGNOSIS — I509 Heart failure, unspecified: Secondary | ICD-10-CM | POA: Diagnosis present

## 2013-04-16 DIAGNOSIS — N189 Chronic kidney disease, unspecified: Secondary | ICD-10-CM | POA: Diagnosis present

## 2013-04-16 DIAGNOSIS — G4730 Sleep apnea, unspecified: Secondary | ICD-10-CM | POA: Diagnosis present

## 2013-04-16 DIAGNOSIS — B965 Pseudomonas (aeruginosa) (mallei) (pseudomallei) as the cause of diseases classified elsewhere: Secondary | ICD-10-CM | POA: Diagnosis present

## 2013-04-16 DIAGNOSIS — E785 Hyperlipidemia, unspecified: Secondary | ICD-10-CM | POA: Diagnosis present

## 2013-04-16 DIAGNOSIS — T8140XA Infection following a procedure, unspecified, initial encounter: Secondary | ICD-10-CM

## 2013-04-16 DIAGNOSIS — I739 Peripheral vascular disease, unspecified: Secondary | ICD-10-CM | POA: Diagnosis present

## 2013-04-16 DIAGNOSIS — I4891 Unspecified atrial fibrillation: Secondary | ICD-10-CM | POA: Diagnosis present

## 2013-04-16 DIAGNOSIS — N400 Enlarged prostate without lower urinary tract symptoms: Secondary | ICD-10-CM | POA: Diagnosis present

## 2013-04-16 HISTORY — PX: REMOVAL OF GRAFT: SHX6361

## 2013-04-16 LAB — GLUCOSE, CAPILLARY
GLUCOSE-CAPILLARY: 121 mg/dL — AB (ref 70–99)
GLUCOSE-CAPILLARY: 98 mg/dL (ref 70–99)
Glucose-Capillary: 72 mg/dL (ref 70–99)
Glucose-Capillary: 74 mg/dL (ref 70–99)
Glucose-Capillary: 86 mg/dL (ref 70–99)

## 2013-04-16 LAB — CBC
HEMATOCRIT: 39.2 % (ref 39.0–52.0)
HEMOGLOBIN: 12 g/dL — AB (ref 13.0–17.0)
MCH: 23.5 pg — AB (ref 26.0–34.0)
MCHC: 30.6 g/dL (ref 30.0–36.0)
MCV: 76.7 fL — ABNORMAL LOW (ref 78.0–100.0)
Platelets: 187 10*3/uL (ref 150–400)
RBC: 5.11 MIL/uL (ref 4.22–5.81)
RDW: 16 % — ABNORMAL HIGH (ref 11.5–15.5)
WBC: 7 10*3/uL (ref 4.0–10.5)

## 2013-04-16 LAB — WOUND CULTURE: Gram Stain: NONE SEEN

## 2013-04-16 LAB — CREATININE, SERUM
Creatinine, Ser: 1.46 mg/dL — ABNORMAL HIGH (ref 0.50–1.35)
GFR calc Af Amer: 52 mL/min — ABNORMAL LOW (ref >90)
GFR calc non Af Amer: 45 mL/min — ABNORMAL LOW (ref >90)

## 2013-04-16 SURGERY — REMOVAL, GRAFT
Anesthesia: General | Site: Leg Upper | Laterality: Left

## 2013-04-16 MED ORDER — PROPOFOL 10 MG/ML IV BOLUS
INTRAVENOUS | Status: AC
  Filled 2013-04-16: qty 20

## 2013-04-16 MED ORDER — CIPROFLOXACIN IN D5W 400 MG/200ML IV SOLN
400.0000 mg | Freq: Two times a day (BID) | INTRAVENOUS | Status: DC
  Administered 2013-04-16 – 2013-04-19 (×6): 400 mg via INTRAVENOUS
  Filled 2013-04-16 (×9): qty 200

## 2013-04-16 MED ORDER — WARFARIN - PHYSICIAN DOSING INPATIENT: Freq: Every day | Status: DC

## 2013-04-16 MED ORDER — WARFARIN SODIUM 5 MG PO TABS
5.0000 mg | ORAL_TABLET | Freq: Once | ORAL | Status: AC
  Administered 2013-04-16: 5 mg via ORAL
  Filled 2013-04-16: qty 1

## 2013-04-16 MED ORDER — CIPROFLOXACIN IN D5W 400 MG/200ML IV SOLN
400.0000 mg | Freq: Two times a day (BID) | INTRAVENOUS | Status: DC
  Administered 2013-04-16: 400 mg via INTRAVENOUS
  Filled 2013-04-16: qty 200

## 2013-04-16 MED ORDER — WARFARIN SODIUM 2.5 MG PO TABS
2.5000 mg | ORAL_TABLET | Freq: Every day | ORAL | Status: DC
  Filled 2013-04-16: qty 1

## 2013-04-16 MED ORDER — HYDROMORPHONE HCL PF 1 MG/ML IJ SOLN
0.2500 mg | INTRAMUSCULAR | Status: DC | PRN
  Administered 2013-04-16 (×2): 0.5 mg via INTRAVENOUS
  Administered 2013-04-16 (×2): 0.25 mg via INTRAVENOUS

## 2013-04-16 MED ORDER — MORPHINE SULFATE 2 MG/ML IJ SOLN: 2.0000 mg | INTRAMUSCULAR | Status: DC | PRN

## 2013-04-16 MED ORDER — POTASSIUM CHLORIDE CRYS ER 20 MEQ PO TBCR
20.0000 meq | EXTENDED_RELEASE_TABLET | Freq: Once | ORAL | Status: DC
Start: 2013-04-16 — End: 2013-04-19
  Filled 2013-04-16: qty 1

## 2013-04-16 MED ORDER — SUCCINYLCHOLINE CHLORIDE 20 MG/ML IJ SOLN
INTRAMUSCULAR | Status: AC
  Filled 2013-04-16: qty 1

## 2013-04-16 MED ORDER — LOSARTAN POTASSIUM 50 MG PO TABS
100.0000 mg | ORAL_TABLET | Freq: Every day | ORAL | Status: DC
  Administered 2013-04-16 – 2013-04-19 (×4): 100 mg via ORAL
  Filled 2013-04-16 (×4): qty 2

## 2013-04-16 MED ORDER — TRAZODONE HCL 50 MG PO TABS
50.0000 mg | ORAL_TABLET | Freq: Every day | ORAL | Status: DC
  Administered 2013-04-16 – 2013-04-18 (×3): 50 mg via ORAL
  Filled 2013-04-16 (×4): qty 1

## 2013-04-16 MED ORDER — LACTATED RINGERS IV SOLN
INTRAVENOUS | Status: DC | PRN
  Administered 2013-04-16: 09:00:00 via INTRAVENOUS

## 2013-04-16 MED ORDER — PROPOFOL 10 MG/ML IV BOLUS
INTRAVENOUS | Status: DC | PRN
  Administered 2013-04-16: 130 mg via INTRAVENOUS

## 2013-04-16 MED ORDER — FENTANYL CITRATE 0.05 MG/ML IJ SOLN
INTRAMUSCULAR | Status: DC | PRN
  Administered 2013-04-16: 100 ug via INTRAVENOUS
  Administered 2013-04-16: 50 ug via INTRAVENOUS

## 2013-04-16 MED ORDER — GUAIFENESIN-DM 100-10 MG/5ML PO SYRP: 15.0000 mL | ORAL_SOLUTION | ORAL | Status: DC | PRN

## 2013-04-16 MED ORDER — HYDRALAZINE HCL 20 MG/ML IJ SOLN: 10.0000 mg | INTRAMUSCULAR | Status: DC | PRN

## 2013-04-16 MED ORDER — ONDANSETRON HCL 4 MG/2ML IJ SOLN
4.0000 mg | Freq: Four times a day (QID) | INTRAMUSCULAR | Status: DC | PRN
  Administered 2013-04-16: 4 mg via INTRAVENOUS
  Filled 2013-04-16: qty 2

## 2013-04-16 MED ORDER — HYDROMORPHONE HCL PF 1 MG/ML IJ SOLN
INTRAMUSCULAR | Status: AC
  Filled 2013-04-16: qty 1

## 2013-04-16 MED ORDER — OXYCODONE HCL 5 MG PO TABS
ORAL_TABLET | ORAL | Status: AC
  Filled 2013-04-16: qty 2

## 2013-04-16 MED ORDER — HYDROMORPHONE HCL PF 1 MG/ML IJ SOLN
0.5000 mg | INTRAMUSCULAR | Status: DC | PRN
  Administered 2013-04-16 – 2013-04-17 (×2): 1 mg via INTRAVENOUS
  Filled 2013-04-16 (×2): qty 1

## 2013-04-16 MED ORDER — PROMETHAZINE HCL 25 MG/ML IJ SOLN: 6.2500 mg | INTRAMUSCULAR | Status: DC | PRN

## 2013-04-16 MED ORDER — HYDROMORPHONE HCL PF 1 MG/ML IJ SOLN
0.2500 mg | INTRAMUSCULAR | Status: DC | PRN
  Administered 2013-04-16: 0.5 mg via INTRAVENOUS

## 2013-04-16 MED ORDER — SENNA 8.6 MG PO TABS
1.0000 | ORAL_TABLET | Freq: Two times a day (BID) | ORAL | Status: DC
  Administered 2013-04-16 – 2013-04-19 (×7): 8.6 mg via ORAL
  Filled 2013-04-16 (×8): qty 1

## 2013-04-16 MED ORDER — FINASTERIDE 5 MG PO TABS
5.0000 mg | ORAL_TABLET | Freq: Every day | ORAL | Status: DC
  Administered 2013-04-16 – 2013-04-18 (×3): 5 mg via ORAL
  Filled 2013-04-16 (×4): qty 1

## 2013-04-16 MED ORDER — FENTANYL CITRATE 0.05 MG/ML IJ SOLN
INTRAMUSCULAR | Status: AC
  Filled 2013-04-16: qty 5

## 2013-04-16 MED ORDER — 0.9 % SODIUM CHLORIDE (POUR BTL) OPTIME
TOPICAL | Status: DC | PRN
  Administered 2013-04-16: 1000 mL

## 2013-04-16 MED ORDER — CARVEDILOL 6.25 MG PO TABS
6.2500 mg | ORAL_TABLET | Freq: Two times a day (BID) | ORAL | Status: DC
  Administered 2013-04-16 – 2013-04-19 (×6): 6.25 mg via ORAL
  Filled 2013-04-16 (×8): qty 1

## 2013-04-16 MED ORDER — ALUM & MAG HYDROXIDE-SIMETH 200-200-20 MG/5ML PO SUSP: 15.0000 mL | ORAL | Status: DC | PRN

## 2013-04-16 MED ORDER — ENOXAPARIN SODIUM 30 MG/0.3ML ~~LOC~~ SOLN
30.0000 mg | SUBCUTANEOUS | Status: DC
  Administered 2013-04-17 – 2013-04-19 (×3): 30 mg via SUBCUTANEOUS
  Filled 2013-04-16 (×3): qty 0.3

## 2013-04-16 MED ORDER — FUROSEMIDE 40 MG PO TABS
40.0000 mg | ORAL_TABLET | Freq: Every day | ORAL | Status: DC
Start: 2013-04-16 — End: 2013-04-19
  Administered 2013-04-16 – 2013-04-18 (×3): 40 mg via ORAL
  Filled 2013-04-16 (×4): qty 1

## 2013-04-16 MED ORDER — POLYETHYLENE GLYCOL 3350 17 G PO PACK
17.0000 g | PACK | Freq: Every day | ORAL | Status: DC | PRN
  Administered 2013-04-17: 17 g via ORAL
  Filled 2013-04-16: qty 1

## 2013-04-16 MED ORDER — PHYTONADIONE 5 MG PO TABS
5.0000 mg | ORAL_TABLET | Freq: Once | ORAL | Status: DC
  Filled 2013-04-16: qty 1

## 2013-04-16 MED ORDER — ACETAMINOPHEN 650 MG RE SUPP: 325.0000 mg | RECTAL | Status: DC | PRN

## 2013-04-16 MED ORDER — OXYCODONE HCL 5 MG PO TABS: 5.0000 mg | ORAL_TABLET | Freq: Once | ORAL | Status: DC | PRN

## 2013-04-16 MED ORDER — DSS 100 MG PO CAPS: 100.0000 mg | ORAL_CAPSULE | ORAL | Status: DC | PRN

## 2013-04-16 MED ORDER — GABAPENTIN 400 MG PO CAPS
400.0000 mg | ORAL_CAPSULE | Freq: Every day | ORAL | Status: DC
  Administered 2013-04-16 – 2013-04-19 (×4): 400 mg via ORAL
  Filled 2013-04-16 (×4): qty 1

## 2013-04-16 MED ORDER — OXYCODONE HCL 5 MG PO TABS
5.0000 mg | ORAL_TABLET | ORAL | Status: DC | PRN
  Administered 2013-04-16: 10 mg via ORAL

## 2013-04-16 MED ORDER — SODIUM CHLORIDE 0.9 % IV SOLN
INTRAVENOUS | Status: DC
  Administered 2013-04-16 – 2013-04-19 (×4): via INTRAVENOUS

## 2013-04-16 MED ORDER — NITROGLYCERIN 0.4 MG SL SUBL: 0.4000 mg | SUBLINGUAL_TABLET | SUBLINGUAL | Status: DC | PRN

## 2013-04-16 MED ORDER — GABAPENTIN 400 MG PO CAPS
400.0000 mg | ORAL_CAPSULE | Freq: Two times a day (BID) | ORAL | Status: DC
Start: 2013-04-16 — End: 2013-04-16

## 2013-04-16 MED ORDER — OXYCODONE HCL 5 MG/5ML PO SOLN
5.0000 mg | Freq: Once | ORAL | Status: DC | PRN
Start: 2013-04-16 — End: 2013-04-16

## 2013-04-16 MED ORDER — PHENOL 1.4 % MT LIQD
1.0000 | OROMUCOSAL | Status: DC | PRN
  Filled 2013-04-16: qty 177

## 2013-04-16 MED ORDER — LACTATED RINGERS IV SOLN
INTRAVENOUS | Status: DC
  Administered 2013-04-16: 09:00:00 via INTRAVENOUS

## 2013-04-16 MED ORDER — MIDAZOLAM HCL 5 MG/5ML IJ SOLN
INTRAMUSCULAR | Status: DC | PRN
  Administered 2013-04-16: 1 mg via INTRAVENOUS

## 2013-04-16 MED ORDER — OXYCODONE-ACETAMINOPHEN 5-325 MG PO TABS
1.0000 | ORAL_TABLET | ORAL | Status: DC | PRN
  Administered 2013-04-16 – 2013-04-18 (×3): 2 via ORAL
  Filled 2013-04-16 (×3): qty 2

## 2013-04-16 MED ORDER — DEXTROSE 5 % IV SOLN
INTRAVENOUS | Status: DC | PRN
  Administered 2013-04-16: 10:00:00 via INTRAVENOUS

## 2013-04-16 MED ORDER — LIDOCAINE HCL (CARDIAC) 20 MG/ML IV SOLN
INTRAVENOUS | Status: DC | PRN
  Administered 2013-04-16: 100 mg via INTRAVENOUS

## 2013-04-16 MED ORDER — GABAPENTIN 400 MG PO CAPS
800.0000 mg | ORAL_CAPSULE | Freq: Every day | ORAL | Status: DC
  Administered 2013-04-16 – 2013-04-18 (×3): 800 mg via ORAL
  Filled 2013-04-16 (×4): qty 2

## 2013-04-16 MED ORDER — SODIUM CHLORIDE 0.9 % IV SOLN: INTRAVENOUS | Status: DC

## 2013-04-16 MED ORDER — LABETALOL HCL 5 MG/ML IV SOLN
10.0000 mg | INTRAVENOUS | Status: DC | PRN
  Filled 2013-04-16: qty 4

## 2013-04-16 MED ORDER — TRAMADOL HCL 50 MG PO TABS
50.0000 mg | ORAL_TABLET | Freq: Every day | ORAL | Status: DC
  Administered 2013-04-16 – 2013-04-18 (×3): 50 mg via ORAL
  Filled 2013-04-16 (×3): qty 1

## 2013-04-16 MED ORDER — INSULIN ASPART PROT & ASPART (70-30 MIX) 100 UNIT/ML ~~LOC~~ SUSP
7.0000 [IU] | Freq: Two times a day (BID) | SUBCUTANEOUS | Status: DC
  Administered 2013-04-16 – 2013-04-19 (×6): 7 [IU] via SUBCUTANEOUS
  Filled 2013-04-16: qty 10

## 2013-04-16 MED ORDER — LIDOCAINE HCL (CARDIAC) 20 MG/ML IV SOLN
INTRAVENOUS | Status: AC
  Filled 2013-04-16: qty 15

## 2013-04-16 MED ORDER — MIDAZOLAM HCL 2 MG/2ML IJ SOLN
INTRAMUSCULAR | Status: AC
  Filled 2013-04-16: qty 2

## 2013-04-16 MED ORDER — OXYCODONE-ACETAMINOPHEN 5-325 MG PO TABS
2.0000 | ORAL_TABLET | Freq: Two times a day (BID) | ORAL | Status: DC | PRN
  Filled 2013-04-16: qty 2

## 2013-04-16 MED ORDER — METOPROLOL TARTRATE 1 MG/ML IV SOLN: 2.0000 mg | INTRAVENOUS | Status: DC | PRN

## 2013-04-16 MED ORDER — DOCUSATE SODIUM 100 MG PO CAPS
100.0000 mg | ORAL_CAPSULE | Freq: Every day | ORAL | Status: DC | PRN
  Administered 2013-04-17: 100 mg via ORAL
  Filled 2013-04-16: qty 1

## 2013-04-16 MED ORDER — PANTOPRAZOLE SODIUM 40 MG PO TBEC
40.0000 mg | DELAYED_RELEASE_TABLET | Freq: Every day | ORAL | Status: DC
  Administered 2013-04-16 – 2013-04-17 (×2): 40 mg via ORAL
  Filled 2013-04-16 (×3): qty 1

## 2013-04-16 MED ORDER — ACETAMINOPHEN 325 MG PO TABS: 325.0000 mg | ORAL_TABLET | ORAL | Status: DC | PRN

## 2013-04-16 MED ORDER — ONDANSETRON HCL 4 MG/2ML IJ SOLN
INTRAMUSCULAR | Status: AC
  Filled 2013-04-16: qty 2

## 2013-04-16 MED ORDER — ARTIFICIAL TEARS OP OINT
TOPICAL_OINTMENT | OPHTHALMIC | Status: AC
  Filled 2013-04-16: qty 3.5

## 2013-04-16 SURGICAL SUPPLY — 35 items
BANDAGE ELASTIC 4 VELCRO ST LF (GAUZE/BANDAGES/DRESSINGS) ×2 IMPLANT
BNDG GAUZE ELAST 4 BULKY (GAUZE/BANDAGES/DRESSINGS) ×2 IMPLANT
CANISTER SUCTION 2500CC (MISCELLANEOUS) ×3 IMPLANT
CLIP TI MEDIUM 6 (CLIP) ×3 IMPLANT
CLIP TI WIDE RED SMALL 6 (CLIP) ×3 IMPLANT
COVER SURGICAL LIGHT HANDLE (MISCELLANEOUS) ×3 IMPLANT
DECANTER SPIKE VIAL GLASS SM (MISCELLANEOUS) IMPLANT
ELECT REM PT RETURN 9FT ADLT (ELECTROSURGICAL) ×3
ELECTRODE REM PT RTRN 9FT ADLT (ELECTROSURGICAL) ×1 IMPLANT
GEL ULTRASOUND 20GR AQUASONIC (MISCELLANEOUS) ×3 IMPLANT
GLOVE BIO SURGEON STRL SZ 6.5 (GLOVE) ×2 IMPLANT
GLOVE BIO SURGEONS STRL SZ 6.5 (GLOVE) ×2
GLOVE BIOGEL PI IND STRL 8 (GLOVE) IMPLANT
GLOVE BIOGEL PI INDICATOR 8 (GLOVE) ×2
GLOVE SS BIOGEL STRL SZ 7 (GLOVE) ×1 IMPLANT
GLOVE SUPERSENSE BIOGEL SZ 7 (GLOVE) ×2
GOWN STRL REUS W/ TWL LRG LVL3 (GOWN DISPOSABLE) ×3 IMPLANT
GOWN STRL REUS W/TWL LRG LVL3 (GOWN DISPOSABLE) ×9
KIT BASIN OR (CUSTOM PROCEDURE TRAY) ×3 IMPLANT
KIT ROOM TURNOVER OR (KITS) ×3 IMPLANT
NDL HYPO 25GX1X1/2 BEV (NEEDLE) ×1 IMPLANT
NEEDLE HYPO 25GX1X1/2 BEV (NEEDLE) ×3 IMPLANT
NS IRRIG 1000ML POUR BTL (IV SOLUTION) ×3 IMPLANT
PACK CV ACCESS (CUSTOM PROCEDURE TRAY) ×3 IMPLANT
PAD ARMBOARD 7.5X6 YLW CONV (MISCELLANEOUS) ×6 IMPLANT
SPONGE GAUZE 4X4 12PLY (GAUZE/BANDAGES/DRESSINGS) ×3 IMPLANT
SPONGE GAUZE 4X4 12PLY STER LF (GAUZE/BANDAGES/DRESSINGS) ×2 IMPLANT
SUT PROLENE 2 0 MH 48 (SUTURE) ×2 IMPLANT
SUT PROLENE 6 0 BV (SUTURE) ×3 IMPLANT
SUT VIC AB 3-0 SH 27 (SUTURE) ×6
SUT VIC AB 3-0 SH 27X BRD (SUTURE) ×2 IMPLANT
TOWEL OR 17X24 6PK STRL BLUE (TOWEL DISPOSABLE) ×3 IMPLANT
TOWEL OR 17X26 10 PK STRL BLUE (TOWEL DISPOSABLE) ×3 IMPLANT
UNDERPAD 30X30 INCONTINENT (UNDERPADS AND DIAPERS) ×3 IMPLANT
WATER STERILE IRR 1000ML POUR (IV SOLUTION) ×3 IMPLANT

## 2013-04-16 NOTE — Progress Notes (Signed)
Last 2 MRSA screens negative, October & July 2014.  Contact isolation cancelled .

## 2013-04-16 NOTE — H&P (View-Only) (Signed)
Subjective:     Patient ID: [name], male   DOB: Jun 11, 1934, 79 y.o.   MRN: [id number]  HPI this 79 year old male is well known to me having had multiple attempts at revascularization left leg for limb salvage. 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 Since that time he has had some periodic drainage from a small sinus tract in the left AKA stump which was otherwise well healed. He did have an old thrombosed Gore-Tex graft where an attempted bypass had been unsuccessful and this was removed from the distal end of the agitation stunt during the procedure and was disconnected from the left common femoral artery at a subsequent procedure. Patient has had no infection or drainage from the proximal aspect of the left amputation stump near the inguinal area. This is old and well healed.  Past Medical History  Diagnosis Date  . Hyperlipidemia   . Gout   . Benign prostatic hypertrophy   . Atrial fibrillation     sees Dr. Daneen Schick  . Chronic systolic dysfunction of left ventricle     a. mixed ischemic and nonischemic CM,  EF 35%. b. s/p AICD implantation.  . CHF (congestive heart failure)   . ED (erectile dysfunction)   . Arthritis   . Pacemaker     medtronic  . CAD (coronary artery disease)     a. s/p mid LAD stenting with DES 2008 Dr. Daneen Schick  . ICD (implantable cardiac defibrillator) in place     [employer], Dr. Rayann Heman  . ICD (implantable cardiac defibrillator) in place     due for check in Feb/2013  . Sleep apnea     hx of "had surgery for"  . Hypertension     sees Dr. Barbie Banner  . Type II diabetes mellitus   . Automatic implantable cardioverter-defibrillator in situ   . Depression   . Numbness and tingling     Hx; 74f left foot  . PAD (peripheral artery disease)     Severe by PV angiogram 09/2011  . Renal artery stenosis     s/p stenting 2009  . PAD (peripheral artery disease)     s/p multiple LLE bypass grafts; left mid-distal SCA occlusion  by 08/2012 duplex    History  Substance Use Topics  . Smoking status: Never Smoker   . Smokeless tobacco: Never Used     Comment: 1-2 cigars when golfing  . Alcohol Use: 2.4 oz/week    2 Glasses of wine, 2 Shots of liquor per week     Comment: 07/10/2012 "galss of wine or vodka tonic 3 X/wk"    Family History  Problem Relation Age of Onset  . Cancer      breast/fhx  . Heart disease      fhx  . Diabetes Neg Hx   . Cancer Father     Allergies  Allergen Reactions  . Other Other (See Comments)    Plastic tape tears skin    Current outpatient prescriptions:carvedilol (COREG) 6.25 MG tablet, Take 1 tablet (6.25 mg total) by mouth 2 (two) times daily with a meal., Disp: 180 tablet, Rfl: 3;  Docusate Sodium (DSS) 100 MG CAPS, Take 100 mg by mouth as needed. Stool softner--over there counter., Disp: 30 each, Rfl: 3;  finasteride (PROSCAR) 5 MG tablet, Take 5 mg by mouth daily., Disp: , Rfl:  furosemide (LASIX) 40 MG tablet, Take 1 tablet (40 mg total) by mouth daily., Disp: 90 tablet, Rfl: 3;  gabapentin (NEURONTIN) 400 MG capsule, Take 1 capsule (400 mg total) by mouth daily. 1 tab in the am and 2 in the evening, Disp: 120 capsule, Rfl: 1;  insulin aspart protamine- aspart (NOVOLOG MIX 70/30) (70-30) 100 UNIT/ML injection, Inject 7 Units into the skin 2 (two) times daily with a meal., Disp: , Rfl:  losartan (COZAAR) 100 MG tablet, Take 1 tablet (100 mg total) by mouth daily., Disp: 90 tablet, Rfl: 3;  NITROSTAT 0.4 MG SL tablet, Place 0.4 mg under the tongue every 5 (five) minutes as needed for chest pain. For chest pain, max 3 doses, Disp: , Rfl: ;  oxyCODONE-acetaminophen (PERCOCET/ROXICET) 5-325 MG per tablet, Take 1 tablet by mouth every 6 (six) hours as needed for severe pain., Disp: 100 tablet, Rfl: 0 polyethylene glycol (MIRALAX / GLYCOLAX) packet, Take 17 g by mouth daily. For constipation--available over the counter in a bottle, Disp: , Rfl: ;  traMADol (ULTRAM) 50 MG tablet, Take 1  tablet (50 mg total) by mouth at bedtime. For neuropathy, Disp: 30 tablet, Rfl: 1;  traZODone (DESYREL) 50 MG tablet, , Disp: , Rfl: ;  warfarin (COUMADIN) 2.5 MG tablet, Take as directed by anticoagulation clinic, Disp: 35 tablet, Rfl: 3  There were no vitals taken for this visit.  There is no weight on file to calculate BMI.           Review of Systems dissected chest pain, dyspnea on exertion, PND, orthopnea, hemoptysis. Patient has known pacemaker and AICD in left subclavian vein area patient on chronic Coumadin for A. fib.     Objective:   Physical Exam There were no vitals taken for this visit.  Gen.-alert and oriented x3 in no apparent distress HEENT normal for age Lungs no rhonchi or wheezing Cardiovascular regular rhythm no murmurs carotid pulses 3+ palpable no bruits audible Abdomen soft nontender no palpable masses Musculoskeletal free of  major deformities Skin clear -no rashes Neurologic normal Lower extremities left AKA with 2+ femoral pulse palpable. Small sinus tract in the midportion of stump is not inflamed. This was explored with swab and will enter stump approximately 6 cm or so with some bloody purulent drainage. This was cultured. No fluctuance noted in stump itself. Right lower extremity free of ischemic changes       Assessment:     Persistent drainage from left AKA stump-possibly from residual Gore-Tex graft which was chronically thrombosed and was disconnected from left common femoral artery at most recent operative procedure    Plan:     #1 vitamin K 3 mg orally today #2 hold Coumadin #3 we'll explore the left AKA stump on Friday, February 13 2 see if residual Gore-Tex is the problem or if other area needs further debridement or drainage. Discussed this with the patient and his son and they would like to proceed

## 2013-04-16 NOTE — Anesthesia Preprocedure Evaluation (Signed)
Anesthesia Evaluation  Patient identified by MRN, date of birth, ID band Patient awake    Reviewed: Allergy & Precautions, H&P , NPO status , Patient's Chart, lab work & pertinent test results  History of Anesthesia Complications Negative for: history of anesthetic complications  Airway Mallampati: II TM Distance: >3 FB Neck ROM: Full    Dental  (+) Teeth Intact, Dental Advisory Given   Pulmonary neg pulmonary ROS, sleep apnea ,    Pulmonary exam normal       Cardiovascular hypertension, + CAD, + Peripheral Vascular Disease and +CHF + pacemaker + Cardiac Defibrillator     Neuro/Psych PSYCHIATRIC DISORDERS Depression negative neurological ROS     GI/Hepatic negative GI ROS, Neg liver ROS,   Endo/Other  diabetes  Renal/GU Renal InsufficiencyRenal disease     Musculoskeletal   Abdominal   Peds  Hematology   Anesthesia Other Findings   Reproductive/Obstetrics                           Anesthesia Physical Anesthesia Plan  ASA: III  Anesthesia Plan: General   Post-op Pain Management:    Induction: Intravenous  Airway Management Planned: LMA  Additional Equipment:   Intra-op Plan:   Post-operative Plan: Extubation in OR  Informed Consent: I have reviewed the patients History and Physical, chart, labs and discussed the procedure including the risks, benefits and alternatives for the proposed anesthesia with the patient or authorized representative who has indicated his/her understanding and acceptance.   Dental advisory given  Plan Discussed with: CRNA, Anesthesiologist and Surgeon  Anesthesia Plan Comments:         Anesthesia Quick Evaluation

## 2013-04-16 NOTE — Progress Notes (Signed)
Call to Dr. Tobias Alexander, reported CBG, order rec'd to recheck at 0930

## 2013-04-16 NOTE — Progress Notes (Signed)
Pt arrived to room 2W30 from OR; Alert and oriented x4 ; C/O level 7 pain at surgical (I & D) site; 2 Percocet PO given; Compression dressing, ace at left AKA clean, dry, intact. VSS, son at [facility]. Monitor. Pt states Percocet ineffective, reports MSO4 IV as ordered PRN makes him "confused." MD aware, order for Dilaudid received. 1700; dressing Lt AKA remains CDI with CMS WNL. Pt reports dilaudid IV effective.

## 2013-04-16 NOTE — Op Note (Signed)
OPERATIVE REPORT  Date of Surgery: 04/16/2013  Surgeon: Tinnie Gens, MD  Assistant: Dionicio Stall  Pre-op Diagnosis: Infected sinus tract left AKA stump  Post-op Diagnosis: Same -- no evidence of infected old Gore-Tex graft  Procedure: Procedure(s): Incision and drainage of left AKA stump  Anesthesia: General  EBL: 0  Patient to the operating room placed in supine position at which time satisfactory general endotracheal anesthesia was administered. The left AKA stump was prepped with Betadine scrub and solution draped in routine sterile manner. There was a draining sinus tract in the midportion of the left AKA stump which had been cultured in the office and was growing Pseudomonas. A probe was used to explore the sinus tract and it extended about 6-8 cm deep lateral and posterior to the femur. Incision was made medially and laterally and exploration continued to the depth of this tract. There was a cavity which was entered which had some old infected debris possibly some old hematoma. This was evacuated thoroughly irrigated with saline and careful exploration of this well-defined space revealed no evidence of any old synthetic graft material. Spacing to be very well-defined. It was also more lateral than one would expect the femoral-popliteal or Tex graft be located. Therefore decided to pack this with moist Kerlix sterile dressing was applied the patient was taken to the recovery room in stable condition  Complications: None  Procedure Details:   Tinnie Gens, MD 04/16/2013 10:29 AM

## 2013-04-16 NOTE — Anesthesia Postprocedure Evaluation (Signed)
Anesthesia Post Note  Patient: [name]  Procedure(s) Performed: Procedure(s) (LRB): I & D LEFT AKA WOUND, POSSIBLE REMOVAL OF INFECTED GORTEX GRAFT (Left)  Anesthesia type: general  Patient location: PACU  Post pain: Pain level controlled  Post assessment: Patient's Cardiovascular Status Stable  Last Vitals:  Filed Vitals:   04/16/13 1230  BP: 156/70  Pulse: 70  Temp:   Resp: 12    Post vital signs: Reviewed and stable  Level of consciousness: sedated  Complications: No apparent anesthesia complications

## 2013-04-16 NOTE — Transfer of Care (Signed)
Immediate Anesthesia Transfer of Care Note  Patient: [name]  Procedure(s) Performed: Procedure(s): I & D LEFT AKA WOUND, POSSIBLE REMOVAL OF INFECTED GORTEX GRAFT (Left)  Patient Location: PACU  Anesthesia Type:General  Level of Consciousness: awake, sedated and patient cooperative  Airway & Oxygen Therapy: Patient Spontanous Breathing and Patient connected to nasal cannula oxygen  Post-op Assessment: Report given to PACU RN, Post -op Vital signs reviewed and stable and Patient moving all extremities  Post vital signs: Reviewed and stable  Complications: No apparent anesthesia complications

## 2013-04-16 NOTE — Interval H&P Note (Signed)
History and Physical Interval Note:  04/16/2013 9:08 AM  [name]  has presented today for surgery, with the diagnosis of Infected postoperative wound  The various methods of treatment have been discussed with the patient and family. After consideration of risks, benefits and other options for treatment, the patient has consented to  Procedure(s): I & D LEFT AKA WOUND, POSSIBLE REMOVAL OF INFECTED GORTEX GRAFT (Left) as a surgical intervention .  The patient's history has been reviewed, patient examined, no change in status, stable for surgery.  I have reviewed the patient's chart and labs.  Questions were answered to the patient's satisfaction.     Tinnie Gens

## 2013-04-16 NOTE — Progress Notes (Signed)
[facility]  Patient Status: Active (receiving services up to time of hospitalization)  [facility] is providing the following services: RN and HHA -   If patient discharges after hours, please call [phone].   Pearletha Forge 04/16/2013, 5:06 PM

## 2013-04-16 NOTE — Progress Notes (Signed)
ANTICOAGULATION CONSULT NOTE - Initial Consult  Pharmacy Consult for coumadin Indication: atrial fibrillation  Allergies  Allergen Reactions  . Other Other (See Comments)    Plastic tape tears skin    Patient Measurements: Height: 5' 8.11" (173 cm) Weight: 162 lb 14.7 oz (73.9 kg) IBW/kg (Calculated) : 68.65   Vital Signs: Temp: 97.9 F (36.6 C) (02/13 1251) Temp src: Axillary (02/13 1251) BP: 169/71 mmHg (02/13 1251) Pulse Rate: 75 (02/13 1251)  Labs:  Recent Labs  04/15/13 1106  HGB 11.7*  HCT 38.4*  PLT 212  APTT 33  LABPROT 15.7*  INR 1.28  CREATININE 1.71*    Estimated Creatinine Clearance: 35.2 ml/min (by C-G formula based on Cr of 1.71).   Medical History: Past Medical History  Diagnosis Date  . Gout   . Benign prostatic hypertrophy     takes Proscar daily  . Atrial fibrillation     takes Warfarin daily  . Chronic systolic dysfunction of left ventricle     a. mixed ischemic and nonischemic CM,  EF 35%. b. s/p AICD implantation.  . ED (erectile dysfunction)   . Arthritis   . Pacemaker     medtronic  . CAD (coronary artery disease)     a. s/p mid LAD stenting with DES 2008 Dr. Daneen Schick  . ICD (implantable cardiac defibrillator) in place     medtronic, Dr. Rayann Heman  . ICD (implantable cardiac defibrillator) in place     due for check in Feb/2013  . Sleep apnea     hx of "had surgery for"  . Automatic implantable cardioverter-defibrillator in situ   . Depression   . Numbness and tingling     Hx; 24f left foot  . PAD (peripheral artery disease)     Severe by PV angiogram 09/2011  . Renal artery stenosis     s/p stenting 2009  . PAD (peripheral artery disease)     s/p multiple LLE bypass grafts; left mid-distal SCA occlusion by 08/2012 duplex  . Hypertension     takes Carvedilol and Losartan daily  . Constipation     takes Miralax daily as needed and Colace daily   . Type II diabetes mellitus     takes Novolog 70/30  . CHF (congestive  heart failure)     takes Lasix daily  . History of MRSA infection   . Insomnia     TAKES TRAZODONE NIGHTLY    Medications:  Prescriptions prior to admission  Medication Sig Dispense Refill  . carvedilol (COREG) 6.25 MG tablet Take 1 tablet (6.25 mg total) by mouth 2 (two) times daily with a meal.  180 tablet  3  . Docusate Sodium (DSS) 100 MG CAPS Take 100 mg by mouth as needed. Stool softner--over there counter.  30 each  3  . finasteride (PROSCAR) 5 MG tablet Take 5 mg by mouth daily.      . furosemide (LASIX) 40 MG tablet Take 1 tablet (40 mg total) by mouth daily.  90 tablet  3  . gabapentin (NEURONTIN) 400 MG capsule Take 400-800 mg by mouth 2 (two) times daily. Take 1 tablet (400 mg) in the morning and 2 tablets (800 mg) in the evening.      . insulin aspart protamine- aspart (NOVOLOG MIX 70/30) (70-30) 100 UNIT/ML injection Inject 7 Units into the skin 2 (two) times daily with a meal.      . losartan (COZAAR) 100 MG tablet Take 1 tablet (100 mg total) by mouth  daily.  90 tablet  3  . NITROSTAT 0.4 MG SL tablet Place 0.4 mg under the tongue every 5 (five) minutes as needed for chest pain. For chest pain, max 3 doses      . oxyCODONE-acetaminophen (PERCOCET/ROXICET) 5-325 MG per tablet Take 2 tablets by mouth 2 (two) times daily as needed (pain.).       Marland Kitchen traMADol (ULTRAM) 50 MG tablet Take 50 mg by mouth at bedtime.      . traZODone (DESYREL) 50 MG tablet Take 50 mg by mouth at bedtime.       Marland Kitchen warfarin (COUMADIN) 5 MG tablet Take 2.5 mg by mouth daily.      . phytonadione (VITAMIN K) 5 MG tablet Take 5 mg by mouth once.      . polyethylene glycol (MIRALAX / GLYCOLAX) packet Take 17 g by mouth daily as needed (constipation).         Assessment: 79 yo man s/p I&D infected left AKA stump who was on coumadin PTA for afib.  His last dose of coumadin was 2/9 and he received Vit K 5 mg po X 1 on 04/13/13 in preparation for surgery.  His INR 2/12 was 1.28.  He will be on lovenox for VTE px  until his INR is therapeutic.  Goal of Therapy:  INR 2-3 Monitor platelets by anticoagulation protocol: Yes   Plan:  Coumadin 5 mg po today. Daily PT/INR Monitor for bleeding.  Thanks for allowing pharmacy to be a part of this patient's care.  Excell Seltzer, PharmD Clinical Pharmacist, [phone] 04/16/2013,1:16 PM

## 2013-04-17 ENCOUNTER — Encounter (HOSPITAL_COMMUNITY): Payer: Self-pay | Admitting: *Deleted

## 2013-04-17 LAB — GLUCOSE, CAPILLARY
Glucose-Capillary: 102 mg/dL — ABNORMAL HIGH (ref 70–99)
Glucose-Capillary: 160 mg/dL — ABNORMAL HIGH (ref 70–99)
Glucose-Capillary: 132 mg/dL — ABNORMAL HIGH (ref 70–99)
Glucose-Capillary: 153 mg/dL — ABNORMAL HIGH (ref 70–99)

## 2013-04-17 LAB — BASIC METABOLIC PANEL
BUN: 31 mg/dL — ABNORMAL HIGH (ref 6–23)
CALCIUM: 8.7 mg/dL (ref 8.4–10.5)
CO2: 17 mEq/L — ABNORMAL LOW (ref 19–32)
Chloride: 107 mEq/L (ref 96–112)
Creatinine, Ser: 1.62 mg/dL — ABNORMAL HIGH (ref 0.50–1.35)
GFR calc Af Amer: 46 mL/min — ABNORMAL LOW (ref >90)
GFR calc non Af Amer: 39 mL/min — ABNORMAL LOW (ref >90)
GLUCOSE: 97 mg/dL (ref 70–99)
POTASSIUM: 4.9 meq/L (ref 3.7–5.3)
SODIUM: 139 meq/L (ref 137–147)

## 2013-04-17 LAB — CBC
HCT: 36.7 % — ABNORMAL LOW (ref 39.0–52.0)
HEMOGLOBIN: 11.1 g/dL — AB (ref 13.0–17.0)
MCH: 23.4 pg — AB (ref 26.0–34.0)
MCHC: 30.2 g/dL (ref 30.0–36.0)
MCV: 77.3 fL — ABNORMAL LOW (ref 78.0–100.0)
PLATELETS: 192 10*3/uL (ref 150–400)
RBC: 4.75 MIL/uL (ref 4.22–5.81)
RDW: 15.9 % — ABNORMAL HIGH (ref 11.5–15.5)
WBC: 7.2 10*3/uL (ref 4.0–10.5)

## 2013-04-17 LAB — PROTIME-INR
INR: 1.19 (ref 0.00–1.49)
Prothrombin Time: 14.8 s (ref 11.6–15.2)

## 2013-04-17 LAB — MRSA PCR SCREENING: MRSA BY PCR: NEGATIVE

## 2013-04-17 MED ORDER — WARFARIN - PHARMACIST DOSING INPATIENT
Freq: Every day | Status: DC
  Administered 2013-04-17 – 2013-04-18 (×2)

## 2013-04-17 MED ORDER — WARFARIN SODIUM 5 MG PO TABS
5.0000 mg | ORAL_TABLET | Freq: Once | ORAL | Status: AC
  Administered 2013-04-17: 5 mg via ORAL
  Filled 2013-04-17: qty 1

## 2013-04-17 NOTE — Progress Notes (Signed)
[facility] [place]  Subjective  - Doing well with no new complaints.   Objective 130/77 75 97.8 F (36.6 C) (Oral) 18 99%  Intake/Output Summary (Last 24 hours) at 04/17/13 0855 Last data filed at 04/17/13 0600  Gross per 24 hour  Intake   1710 ml  Output   1551 ml  Net    159 ml    Left stump sinus track irrigated with saline, then packed wet to dry 4 x 4.  Covered with dry guaze and ace wrap. Left groin soft, no erythema or tenderness to palpation.  Assessment/Planning: POD #1 Incision and drainage of left AKA stump  Pseudomonas on cipro IV Plan wound vac placement Mon 04/19/2013 and discharge home with vac and PO Cipro. We will change the dressing tomorrow morning. WBC 7.2, Afebrile     COLLINS, EMMA [facility] 04/17/2013 8:55 AM --  Laboratory Lab Results:  Recent Labs  04/16/13 1415 04/17/13 0740  WBC 7.0 7.2  HGB 12.0* 11.1*  HCT 39.2 36.7*  PLT 187 192   BMET  Recent Labs  04/15/13 1106 04/16/13 1415 04/17/13 0740  NA 139  --  139  K 5.2  --  4.9  CL 106  --  107  CO2 21  --  17*  GLUCOSE 90  --  97  BUN 40*  --  31*  CREATININE 1.71* 1.46* 1.62*  CALCIUM 9.3  --  8.7    COAG Lab Results  Component Value Date   INR 1.19 04/17/2013   INR 1.28 04/15/2013   INR 3.2 03/18/2013   No results found for this basename: PTT

## 2013-04-17 NOTE — Progress Notes (Signed)
ANTICOAGULATION CONSULT NOTE - Follow Up Consult  Pharmacy Consult for coumadin Indication: atrial fibrillation  Allergies  Allergen Reactions  . Other Other (See Comments)    Plastic tape tears skin    Patient Measurements: Height: 5' 8.11" (173 cm) Weight: 162 lb 14.7 oz (73.9 kg) IBW/kg (Calculated) : 68.65 Heparin Dosing Weight:   Vital Signs: Temp: 99.1 F (37.3 C) (02/14 1449) Temp src: Oral (02/14 1449) BP: 103/49 mmHg (02/14 1449) Pulse Rate: 72 (02/14 1449)  Labs:  Recent Labs  04/15/13 1106 04/16/13 1415 04/17/13 0740  HGB 11.7* 12.0* 11.1*  HCT 38.4* 39.2 36.7*  PLT 212 187 192  APTT 33  --   --   LABPROT 15.7*  --  14.8  INR 1.28  --  1.19  CREATININE 1.71* 1.46* 1.62*    Estimated Creatinine Clearance: 37.1 ml/min (by C-G formula based on Cr of 1.62).   Medications:  Scheduled:  . carvedilol  6.25 mg Oral BID WC  . ciprofloxacin  400 mg Intravenous Q12H  . enoxaparin (LOVENOX) injection  30 mg Subcutaneous Q24H  . finasteride  5 mg Oral Daily  . furosemide  40 mg Oral Daily  . gabapentin  400 mg Oral Daily  . gabapentin  800 mg Oral QHS  . insulin aspart protamine- aspart  7 Units Subcutaneous BID WC  . losartan  100 mg Oral Daily  . pantoprazole  40 mg Oral Daily  . potassium chloride  20-40 mEq Oral Once  . senna  1 tablet Oral BID  . traMADol  50 mg Oral QHS  . traZODone  50 mg Oral QHS  . warfarin  5 mg Oral ONCE-1800  . Warfarin - Pharmacist Dosing Inpatient   Does not apply q1800    Assessment: 79 yr old male s/p I&D of infected left AKA stump who was on coumadin PTA for afib. Last dose of coumadin was 2/9 and he received Vit K 5 mg PO prior to surgery.  His INR today is 1.19 His home dose of coumadin was 2.5 mg daily. He is on lovenox 30 mg daily until INR is therapeutic. Goal of Therapy:  INR 2-3    Plan:  Coumadin 5 mg X 1 today. F/U AM INR  Minta Balsam 04/17/2013,3:29 PM

## 2013-04-18 LAB — GLUCOSE, CAPILLARY
GLUCOSE-CAPILLARY: 93 mg/dL (ref 70–99)
Glucose-Capillary: 99 mg/dL (ref 70–99)
Glucose-Capillary: 98 mg/dL (ref 70–99)
Glucose-Capillary: 95 mg/dL (ref 70–99)

## 2013-04-18 LAB — PROTIME-INR
INR: 1.52 — AB (ref 0.00–1.49)
PROTHROMBIN TIME: 17.9 s — AB (ref 11.6–15.2)

## 2013-04-18 MED ORDER — WARFARIN SODIUM 2.5 MG PO TABS
2.5000 mg | ORAL_TABLET | Freq: Once | ORAL | Status: AC
Start: 2013-04-18 — End: 2013-04-18
  Administered 2013-04-18: 2.5 mg via ORAL
  Filled 2013-04-18: qty 1

## 2013-04-18 NOTE — Progress Notes (Signed)
   CARE MANAGEMENT NOTE 04/18/2013  Patient:  [name], [name]   Account Number:  [id number]  Date Initiated:  04/18/2013  Documentation initiated by:  [facility]  Subjective/Objective Assessment:   adm: I & D LEFT AKA WOUND, POSSIBLE REMOVAL OF INFECTED GORTEX GRAFT (Left) as a surgical intervention     Action/Plan:   discharge planning   Anticipated DC Date:  04/19/2013   Anticipated DC Plan:  [place]  CM consult      [facility] Choice  HOME HEALTH   Choice offered to / List presented to:  C-1 Patient   DME arranged  VAC      DME agency  [employer]     HH arranged  HH-1 RN      [place].   Status of service:  In process, will continue to follow Medicare Important Message given?   (If response is "NO", the following Medicare IM given date fields will be blank) Date Medicare IM given:   Date Additional Medicare IM given:    Discharge Disposition:    Per UR Regulation:    If discussed at Long Length of Stay Meetings, dates discussed:    Comments:  04/18/13 10:00 CM spoke with pt for choice for [facility] services. Pt chooses [facility] for [facility].  CM faxed [employer] form, facesheet, H&P, Op note (w/measurements) to BRAD but then received a call "BRAD" out of town this week and [place] requested I send the same to her at [phone].  Rickie stated insurance will have to be verified during office hours tomorrow morning (04/19/13) and then  will deliverVAC  to hospital room tomorrow morning prior to discharge.  Request for [facility] orders made to MD.  Address and contact number verified with pt. Referral faxed to [facility] (orders pending). Will continue follow for disposition; Mariane Masters, BSN, [place].

## 2013-04-18 NOTE — Progress Notes (Signed)
Left incision/wound measures 5 cm in length.  Small amount of serous drainage noted.  Gauze pad changed and rewrapped with ace bandage.

## 2013-04-18 NOTE — Progress Notes (Signed)
ANTICOAGULATION CONSULT NOTE - Follow Up Consult  Pharmacy Consult for coumadin Indication: atrial fibrillation  Allergies  Allergen Reactions  . Other Other (See Comments)    Plastic tape tears skin    Patient Measurements: Height: 5' 8.11" (173 cm) Weight: 167 lb 12.3 oz (76.1 kg) IBW/kg (Calculated) : 68.65   Vital Signs: Temp: 98 F (36.7 C) (02/15 0423) Temp src: Oral (02/15 0423) BP: 144/57 mmHg (02/15 0423) Pulse Rate: 77 (02/15 0423)  Labs:  Recent Labs  04/16/13 1415 04/17/13 0740 04/18/13 0620  HGB 12.0* 11.1*  --   HCT 39.2 36.7*  --   PLT 187 192  --   LABPROT  --  14.8 17.9*  INR  --  1.19 1.52*  CREATININE 1.46* 1.62*  --     Estimated Creatinine Clearance: 37.1 ml/min (by C-G formula based on Cr of 1.62).   Medications:  Scheduled:  . carvedilol  6.25 mg Oral BID WC  . ciprofloxacin  400 mg Intravenous Q12H  . enoxaparin (LOVENOX) injection  30 mg Subcutaneous Q24H  . finasteride  5 mg Oral Daily  . furosemide  40 mg Oral Daily  . gabapentin  400 mg Oral Daily  . gabapentin  800 mg Oral QHS  . insulin aspart protamine- aspart  7 Units Subcutaneous BID WC  . losartan  100 mg Oral Daily  . pantoprazole  40 mg Oral Daily  . potassium chloride  20-40 mEq Oral Once  . senna  1 tablet Oral BID  . traMADol  50 mg Oral QHS  . traZODone  50 mg Oral QHS  . Warfarin - Pharmacist Dosing Inpatient   Does not apply q1800    Assessment:  79 yr old male s/p I&D of infected left AKA stump who was on coumadin PTA for afib. INR was reversed with Vit K 5 mg PO prior to surgery.  His INR today has increased to 1.52 showing a nice response to increased Coumadin doses.  Will resume his home dose of coumadin 2.5 mg daily. He continues on lovenox 30 mg daily until INR is therapeutic.  Goal of Therapy:  INR 2-3    Plan:  Coumadin 2.5 mg X 1 today. F/U AM INR  [employer], Pharm.D., BCPS Clinical Pharmacist Pager [phone] 04/18/2013 1:45 PM

## 2013-04-18 NOTE — Progress Notes (Addendum)
[facility]   Subjective  - Doing well no new complaints.   Objective 144/57 77 98 F (36.7 C) (Oral) 18 99%  Intake/Output Summary (Last 24 hours) at 04/18/13 0846 Last data filed at 04/18/13 0824  Gross per 24 hour  Intake    960 ml  Output   2525 ml  Net  -1565 ml    Left AKA wound track with Minimal purulent drainage.  The wound was irrigated with saline in clean condition and re-packed with wet to dry 4 x 4. Skin warm, no erythema, no active bleeding.    Assessment/Planning:  Left AKA S/P I and D Infected sinus tract left AKA stump Continue Cipro IV, he will be D/C'd with PO cipro. Wound vac placement tomorrow and discharge home with Vac.   Laurence Slate [facility] 04/18/2013 8:46 AM --  Laboratory Lab Results:  Recent Labs  04/16/13 1415 04/17/13 0740  WBC 7.0 7.2  HGB 12.0* 11.1*  HCT 39.2 36.7*  PLT 187 192   BMET  Recent Labs  04/15/13 1106 04/16/13 1415 04/17/13 0740  NA 139  --  139  K 5.2  --  4.9  CL 106  --  107  CO2 21  --  17*  GLUCOSE 90  --  97  BUN 40*  --  31*  CREATININE 1.71* 1.46* 1.62*  CALCIUM 9.3  --  8.7    COAG Lab Results  Component Value Date   INR 1.52* 04/18/2013   INR 1.19 04/17/2013   INR 1.28 04/15/2013   No results found for this basename: PTT   Agree with above assessment Wound examined and repacked with moist saline gauze Some mild purulence on previous dressing  Plan VAC in a.m. and then DC home and return to office in 2-3 weeks  Plan Cipro orally for 2 weeks post discharge

## 2013-04-19 ENCOUNTER — Telehealth: Payer: Self-pay | Admitting: Vascular Surgery

## 2013-04-19 LAB — GLUCOSE, CAPILLARY
Glucose-Capillary: 86 mg/dL (ref 70–99)
Glucose-Capillary: 194 mg/dL — ABNORMAL HIGH (ref 70–99)

## 2013-04-19 LAB — PROTIME-INR
INR: 1.61 — AB (ref 0.00–1.49)
Prothrombin Time: 18.7 seconds — ABNORMAL HIGH (ref 11.6–15.2)

## 2013-04-19 MED ORDER — CIPROFLOXACIN HCL 500 MG PO TABS: 500.0000 mg | ORAL_TABLET | Freq: Two times a day (BID) | ORAL | Status: DC

## 2013-04-19 MED ORDER — OXYCODONE-ACETAMINOPHEN 5-325 MG PO TABS: 2.0000 | ORAL_TABLET | Freq: Two times a day (BID) | ORAL | Status: DC | PRN

## 2013-04-19 NOTE — Telephone Encounter (Addendum)
Message copied by Gena Fray on Mon Apr 19, 2013  3:06 PM ------      Message from: Denman George      Created: Mon Apr 19, 2013 11:12 AM      Regarding: Micheline Rough                   ----- Message -----         From: Gabriel Earing, PA-C         Sent: 04/19/2013  11:00 AM           To: Vvs Charge Pool            S/p I&D of AKA stump 04/19/13.  F/u with Dr. Kellie Simmering in 3 weeks.            Thanks,      Aldona Bar ------  04/19/13: lm for pt, dpm

## 2013-04-19 NOTE — Progress Notes (Signed)
Assessment unchanged. Discussed D/C instructions with pt including new medications and f/u appointments. RX given to pt. Home wound VAC hooked up. IV and tele removed. Pt left with belongings accompanied by RN.

## 2013-04-19 NOTE — Discharge Summary (Signed)
[facility] Discharge Summary  [name] 1934/07/20 79 y.o. male  [id number]  Admission Date: 04/16/2013  Discharge Date: 04/19/13  Physician: Mal Misty, MD  Admission Diagnosis: Infected postoperative wound   HPI:   This is a 79 y.o. male is well known to me having had multiple attempts at revascularization left leg for limb salvage. 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 Since that time he has had some periodic drainage from a small sinus tract in the left AKA stump which was otherwise well healed. He did have an old thrombosed Gore-Tex graft where an attempted bypass had been unsuccessful and this was removed from the distal end of the agitation stunt during the procedure and was disconnected from the left common femoral artery at a subsequent procedure. Patient has had no infection or drainage from the proximal aspect of the left amputation stump near the inguinal area. This is old and well healed.  Hospital Course:  The patient was admitted to the hospital and taken to the operating room on 04/16/2013 and underwent: Incision and drainage of left AKA stump.    The pt tolerated the procedure well and was transported to the PACU in good condition. Wound cultures had been taken earlier in the week in the office and this revealed pseudomonas aeruginosa.  It was sensitive to Cipro.  He was started on IV Cipro in the OR and continued this throughout his hospital course.  He is discharged home on po Cipro 500 mg bid x 2 weeks.  The remainder of the hospital course consisted of increasing mobilization and increasing intake of solids without difficulty.  CBC    Component Value Date/Time   WBC 7.2 04/17/2013 0740   RBC 4.75 04/17/2013 0740   HGB 11.1* 04/17/2013 0740   HCT 36.7* 04/17/2013 0740   PLT 192 04/17/2013 0740   MCV 77.3* 04/17/2013 0740   MCH 23.4* 04/17/2013 0740   MCHC 30.2 04/17/2013 0740   RDW 15.9* 04/17/2013 0740    LYMPHSABS 1.8 02/10/2013 0605   MONOABS 0.8 02/10/2013 0605   EOSABS 0.3 02/10/2013 0605   BASOSABS 0.0 02/10/2013 0605    BMET    Component Value Date/Time   NA 139 04/17/2013 0740   K 4.9 04/17/2013 0740   CL 107 04/17/2013 0740   CO2 17* 04/17/2013 0740   GLUCOSE 97 04/17/2013 0740   BUN 31* 04/17/2013 0740   CREATININE 1.62* 04/17/2013 0740   CREATININE 2.68* 09/07/2012 0812   CALCIUM 8.7 04/17/2013 0740   GFRNONAA 39* 04/17/2013 0740   GFRAA 46* 04/17/2013 0740     Discharge Instructions:   The patient is discharged to home with extensive instructions on wound care and progressive ambulation.  They are instructed not to drive or perform any heavy lifting until returning to see the physician in his office.  Discharge Orders   Future Appointments Provider Department Dept Phone   04/22/2013 9:45 AM [facility] Cou[facility] [place] [facility] [place]   05/19/2013 10:40 AM Meredith Staggers, MD [facility] [phone]   09/27/2013 10:30 AM [facility] [address] [phone]   09/27/2013 11:20 AM Sharmon Leyden Nickel, NP [facility] [phone]   Future Orders Complete By Expires   Call MD for:  redness, tenderness, or signs of infection (pain, swelling, bleeding, redness, odor or green/yellow discharge around incision site)  As directed    Call MD for:  severe or increased pain, loss or decreased feeling  in affected limb(s)  As directed    Call MD for:  temperature >100.5  As directed    Discharge wound care:  As directed    Comments:     Continue wound vac changes three times per week with home health RN.   Driving Restrictions  As directed    Comments:     No driving for 2 weeks   Lifting restrictions  As directed    Comments:     No lifting for 2 weeks   Resume previous diet  As directed       Discharge Diagnosis:  Infected postoperative wound  Secondary  Diagnosis: Patient Active Problem List   Diagnosis Date Noted  . Wound infection 04/16/2013  . Chronic infection of amputation stump 04/13/2013  . Phantom limb pain 03/24/2013  . Wound drainage 03/23/2013  . Encounter for staple removal 03/09/2013  . Acute blood loss anemia 02/10/2013  . Chronic kidney disease 02/10/2013  . Unilateral AKA 02/09/2013  . Nontraumatic ischemic infarction of muscle of lower leg 02/04/2013  . Long term (current) use of anticoagulants 02/01/2013  . Pain, limb, left-Leg 01/21/2013  . Aftercare following surgery of the circulatory system, NEC 01/12/2013  . PVD (peripheral vascular disease) 12/22/2012  . Swelling of limb 10/07/2012  . Subclavian artery stenosis 09/08/2012  . Peripheral vascular disease, unspecified 08/18/2012  . Fever 07/08/2012  . Leg edema 06/02/2012  . Cardiomyopathy, ischemic 05/18/2012  . S/P ICD (internal cardiac defibrillator) procedure 05/18/2012  . Diabetes 05/18/2012  . Pain in limb 02/07/2012  . Atherosclerosis of native arteries of the extremities with intermittent claudication 12/17/2011  . Atherosclerotic PVD with intermittent claudication 10/15/2011  . Type I (juvenile type) diabetes mellitus with peripheral circulatory disorders, uncontrolled 09/06/2011  . PAD (peripheral artery disease) 09/04/2011  . CAD (coronary artery disease)   . Burning sensation of feet 05/10/2011  . Bursitis of right hip 05/10/2011  . Cervicalgia 05/10/2011  . Balance disorder 05/10/2011  . Neurogenic claudication 05/10/2011  . Lumbago 05/10/2011  . Chronic systolic congestive heart failure 07/06/2010  . ECZEMA 01/17/2010  . RENAL INSUFFICIENCY 11/01/2009  . UTI 11/01/2009  . CELLULITIS, LEG, RIGHT 08/22/2009  . LYMPHADENOPATHY, REACTIVE 08/22/2009  . PERIPHERAL NEUROPATHY 01/24/2009  . DIZZINESS 12/29/2008  . ABDOMINAL PAIN, GENERALIZED 08/22/2008  . GOUT 09/18/2007  . CORONARY ARTERY DISEASE 09/18/2007  . HYPERLIPIDEMIA 01/23/2007  .  HYPERTENSION 01/23/2007  . Atrial fibrillation 01/23/2007  . BENIGN PROSTATIC HYPERTROPHY 01/23/2007  . LATERAL EPICONDYLITIS 01/23/2007   Past Medical History  Diagnosis Date  . Gout   . Benign prostatic hypertrophy     takes Proscar daily  . Atrial fibrillation     takes Warfarin daily  . Chronic systolic dysfunction of left ventricle     a. mixed ischemic and nonischemic CM,  EF 35%. b. s/p AICD implantation.  . ED (erectile dysfunction)   . Arthritis   . Pacemaker     medtronic  . CAD (coronary artery disease)     a. s/p mid LAD stenting with DES 2008 Dr. Verdis Prime  . ICD (implantable cardiac defibrillator) in place     medtronic, Dr. Johney Frame  . ICD (implantable cardiac defibrillator) in place     due for check in Feb/2013  . Sleep apnea     hx of "had surgery for"  . Automatic implantable cardioverter-defibrillator in situ   . Depression   . Numbness and tingling  Hx; 55f left foot  . PAD (peripheral artery disease)     Severe by PV angiogram 09/2011  . Renal artery stenosis     s/p stenting 2009  . PAD (peripheral artery disease)     s/p multiple LLE bypass grafts; left mid-distal SCA occlusion by 08/2012 duplex  . Hypertension     takes Carvedilol and Losartan daily  . Constipation     takes Miralax daily as needed and Colace daily   . Type II diabetes mellitus     takes Novolog 70/30  . CHF (congestive heart failure)     takes Lasix daily  . History of MRSA infection   . Insomnia     TAKES TRAZODONE NIGHTLY       Medication List         carvedilol 6.25 MG tablet  Commonly known as:  COREG  Take 1 tablet (6.25 mg total) by mouth 2 (two) times daily with a meal.     ciprofloxacin 500 MG tablet  Commonly known as:  CIPRO  Take 1 tablet (500 mg total) by mouth 2 (two) times daily.     DSS 100 MG Caps  Take 100 mg by mouth as needed. Stool softner--over there counter.     finasteride 5 MG tablet  Commonly known as:  PROSCAR  Take 5 mg by mouth  daily.     furosemide 40 MG tablet  Commonly known as:  LASIX  Take 1 tablet (40 mg total) by mouth daily.     gabapentin 400 MG capsule  Commonly known as:  NEURONTIN  Take 400-800 mg by mouth 2 (two) times daily. Take 1 tablet (400 mg) in the morning and 2 tablets (800 mg) in the evening.     insulin aspart protamine- aspart (70-30) 100 UNIT/ML injection  Commonly known as:  NOVOLOG MIX 70/30  Inject 7 Units into the skin 2 (two) times daily with a meal.     losartan 100 MG tablet  Commonly known as:  COZAAR  Take 1 tablet (100 mg total) by mouth daily.     NITROSTAT 0.4 MG SL tablet  Generic drug:  nitroGLYCERIN  Place 0.4 mg under the tongue every 5 (five) minutes as needed for chest pain. For chest pain, max 3 doses     oxyCODONE-acetaminophen 5-325 MG per tablet  Commonly known as:  PERCOCET/ROXICET  Take 2 tablets by mouth 2 (two) times daily as needed (pain.).     phytonadione 5 MG tablet  Commonly known as:  VITAMIN K  Take 5 mg by mouth once.     polyethylene glycol packet  Commonly known as:  MIRALAX / GLYCOLAX  Take 17 g by mouth daily as needed (constipation).     traMADol 50 MG tablet  Commonly known as:  ULTRAM  Take 50 mg by mouth at bedtime.     traZODone 50 MG tablet  Commonly known as:  DESYREL  Take 50 mg by mouth at bedtime.     warfarin 5 MG tablet  Commonly known as:  COUMADIN  Take 2.5 mg by mouth daily.        Roxicet #30 No Refill  Disposition: home with wound vac  Patient's condition: is Good  Follow up: 1. Dr. Kellie Simmering in 3 weeks 2. Dr. Sarajane Jews 04/22/13 for INR check (pt states he already has an appt for this on 04/22/13).  He will need close f/u as the interaction b/w Coumadin and Cipro is high.   Aldona Bar  Darl Pikes [facility] [phone] 04/19/2013  10:56 AM

## 2013-04-19 NOTE — Progress Notes (Signed)
Patient ID: [name], male   DOB: [birth year], 79 y.o.   MRN: [id number] Dressing dry this a.m.  VAC to be placed by wound service of this a.m. and then discharged home with [name] I changed Monday Wednesday and Friday We'll go home on Cipro 500 twice a day Return to see me in 2-3 weeks

## 2013-04-19 NOTE — Care Management Note (Signed)
    Page 1 of 2   04/19/2013     4:53:27 PM   CARE MANAGEMENT NOTE 04/19/2013  Patient:  VISHAL, SANDLIN   Account Number:  [id number]  Date Initiated:  04/18/2013  Documentation initiated by:  [facility]  Subjective/Objective Assessment:   adm: I & D LEFT AKA WOUND, POSSIBLE REMOVAL OF INFECTED GORTEX GRAFT (Left) as a surgical intervention     Action/Plan:   discharge planning   Anticipated DC Date:  04/19/2013   Anticipated DC Plan:  [place]  CM consult      [facility] Choice  HOME HEALTH   Choice offered to / List presented to:  C-1 Patient   DME arranged  VAC      DME agency  [employer]     HH arranged  HH-1 RN      [place].   Status of service:  Completed, signed off Medicare Important Message given?   (If response is "NO", the following Medicare IM given date fields will be blank) Date Medicare IM given:   Date Additional Medicare IM given:    Discharge Disposition:  [place]  Per UR Regulation:  Reviewed for med. necessity/level of care/duration of stay  If discussed at [place] of Stay Meetings, dates discussed:    Comments:  04/19/13 Abanoub Hanken,RN,BSN [phone] WOUND VAC DELIVERED AROUND NOON TODAY, AS ANTICIPATED. NOTIFIED [place].  04/18/13 14:50 Per Rickie's request, measurement of length faxed to her.  Incisional wound depth was previously faxed and received.   Mariane Masters, BSN, [place] [phone]. 04/18/13 10:00 CM spoke with pt for choice for [facility] services. Pt chooses [facility] for [facility].  CM faxed [employer] form, facesheet, H&P, Op note (w/measurements) to BRAD but then received a call "BRAD" out of town this week and [place] requested I send the same to her at [phone].  Rickie stated insurance will have to be verified during office hours tomorrow morning (04/19/13) and then  will deliverVAC  to hospital room tomorrow morning prior to discharge.  Request for [facility]  orders made to MD.  Address and contact number verified with pt. Referral faxed to [facility] (orders pending). Will continue follow for disposition; Mariane Masters, BSN, [place].

## 2013-04-19 NOTE — Consult Note (Signed)
WOC wound consult note Reason for Consult: placement of NPWT device, left stump site Wound type: surgical  Measurement:1.5cm x 4.0cm x 7.0cm  Wound XBL:TJQZ, beefy red. Track that measures 7.0 cm deep centrally  Drainage (amount, consistency, odor) moderate serousanginous Periwound:intact with sutures at each end of surgical wound Dressing procedure/placement/frequency: 1pc of white foam placed deep, centrally into the track.  Filled remainder of the wound with black foam. Due to the size of the opening, I bridged foam up to the thigh to keep the [facility] pad from harming the periwound.  Hooked to hospital VAC, no home VAC in the room at this time.   Bedside nursing staff to hook to home VAC once arrives to the room. Will need next dressing change Wednesday per [facility] or MD office.   Discussed POC with patient and bedside nurse.  Re consult if needed, will not follow at this time. Thanks  Lashauna Arpin Kellogg, [place] [phone])

## 2013-04-20 ENCOUNTER — Encounter (HOSPITAL_COMMUNITY): Payer: Self-pay | Admitting: Vascular Surgery

## 2013-04-21 ENCOUNTER — Telehealth: Payer: Self-pay | Admitting: Family Medicine

## 2013-04-21 NOTE — Telephone Encounter (Signed)
Call in HCTZ 25 mg daily, #30 with 5 rf

## 2013-04-21 NOTE — Telephone Encounter (Signed)
Pt declined the new script.

## 2013-04-21 NOTE — Telephone Encounter (Signed)
[facility] from [facility] is calling to report patient bp today 170/86 w/headache following wound care with nurse and later with kim occupation therapist around 125 pm bp 158/80 with reporting  of headache improving. Please advise. Pt has protime appt tomorrow

## 2013-04-22 ENCOUNTER — Ambulatory Visit (INDEPENDENT_AMBULATORY_CARE_PROVIDER_SITE_OTHER): Payer: Medicare Other | Admitting: General Practice

## 2013-04-22 DIAGNOSIS — Z7901 Long term (current) use of anticoagulants: Secondary | ICD-10-CM

## 2013-04-22 DIAGNOSIS — I4891 Unspecified atrial fibrillation: Secondary | ICD-10-CM

## 2013-04-22 DIAGNOSIS — Z5181 Encounter for therapeutic drug level monitoring: Secondary | ICD-10-CM

## 2013-04-22 LAB — POCT INR: INR: 2

## 2013-04-22 NOTE — Progress Notes (Signed)
Pre-visit discussion using our clinic review tool. No additional management support is needed unless otherwise documented below in the visit note.  

## 2013-04-22 NOTE — Telephone Encounter (Signed)
noted 

## 2013-05-03 ENCOUNTER — Telehealth: Payer: Self-pay | Admitting: Family Medicine

## 2013-05-03 NOTE — Telephone Encounter (Signed)
Per Dr. Sarajane Jews, okay to give verbal order. I called Heather and left a voice message for the below order.

## 2013-05-03 NOTE — Telephone Encounter (Signed)
Heather from [facility] called and would like to request orders for a nursing assistant to assist pt would personal care ( bathing, etc. )  1-2 times per week for about 1 month. Please call back at [phone]

## 2013-05-17 ENCOUNTER — Encounter: Payer: Self-pay | Admitting: Vascular Surgery

## 2013-05-18 ENCOUNTER — Ambulatory Visit (INDEPENDENT_AMBULATORY_CARE_PROVIDER_SITE_OTHER): Payer: Medicare Other | Admitting: Vascular Surgery

## 2013-05-18 ENCOUNTER — Encounter: Payer: Self-pay | Admitting: Vascular Surgery

## 2013-05-18 ENCOUNTER — Telehealth: Payer: Self-pay

## 2013-05-18 VITALS — BP 166/81 | HR 71 | Ht 68.0 in | Wt 162.0 lb

## 2013-05-18 DIAGNOSIS — I798 Other disorders of arteries, arterioles and capillaries in diseases classified elsewhere: Secondary | ICD-10-CM

## 2013-05-18 DIAGNOSIS — I4891 Unspecified atrial fibrillation: Secondary | ICD-10-CM

## 2013-05-18 DIAGNOSIS — E1159 Type 2 diabetes mellitus with other circulatory complications: Secondary | ICD-10-CM

## 2013-05-18 DIAGNOSIS — T8789 Other complications of amputation stump: Secondary | ICD-10-CM

## 2013-05-18 DIAGNOSIS — G8918 Other acute postprocedural pain: Secondary | ICD-10-CM

## 2013-05-18 DIAGNOSIS — I739 Peripheral vascular disease, unspecified: Secondary | ICD-10-CM

## 2013-05-18 DIAGNOSIS — T8131XA Disruption of external operation (surgical) wound, not elsewhere classified, initial encounter: Secondary | ICD-10-CM

## 2013-05-18 MED ORDER — OXYCODONE-ACETAMINOPHEN 5-325 MG PO TABS: ORAL_TABLET | ORAL | Status: DC

## 2013-05-18 MED ORDER — OXYCODONE-ACETAMINOPHEN 5-325 MG PO TABS
ORAL_TABLET | ORAL | Status: DC
Start: 2013-05-18 — End: 2013-05-19

## 2013-05-18 NOTE — Progress Notes (Signed)
Subjective:     Patient ID: WOODLEY PETZOLD, male   DOB: Jun 26, 1934, 79 y.o.   MRN: [id number]  HPI this 79 year old male returns 3 weeks post I&D of abscess left AKA stump. He has had a VAC in place changed Monday Wednesday and Friday. He's had no chills and fever.   Review of Systems     Objective:   Physical Exam BP 166/81  Pulse 71  Ht 5\' 8"  (1.727 m)  Wt 162 lb (73.483 kg)  BMI 24.64 kg/m2  SpO2 100%  General well-developed well-nourished male in no apparent stress alert and oriented x3 Left AKA stump examined. The VAC it all as was removed. He has some mild purulence. The tunnel is about 4 cm deep.     Assessment:     Abscess cavity healing nicely. Needs continue packing with moist saline gauze    Plan:     Plan #1 DC VAC #2 daily packing with moist saline gauze #3 will have home health nurse visit 3 times per week to facilitate this #4 return to see me in 3 weeks

## 2013-05-18 NOTE — Telephone Encounter (Signed)
Phone call from pt.  Stated he forgot to request refill on pain medication this AM when he saw Dr. Kellie Simmering.  Requested to receive Oxycodone 7.5 mg. for the pain.  Discussed w/ Dr. Kellie Simmering.  Rec'd v.o. For Oxycodone/Acetaminophen 5/ 325 mg 1-2 tabs q 6 hrs/ prn; # 30; no refills.  Pt. notified of prescription to be ready to be picked, up at office.

## 2013-05-19 ENCOUNTER — Encounter: Payer: Self-pay | Admitting: Physical Medicine & Rehabilitation

## 2013-05-19 ENCOUNTER — Encounter: Payer: Medicare Other | Attending: Physical Medicine & Rehabilitation | Admitting: Physical Medicine & Rehabilitation

## 2013-05-19 VITALS — BP 100/78 | HR 76 | Resp 14 | Ht 68.0 in | Wt 162.0 lb

## 2013-05-19 DIAGNOSIS — I4891 Unspecified atrial fibrillation: Secondary | ICD-10-CM | POA: Insufficient documentation

## 2013-05-19 DIAGNOSIS — Z794 Long term (current) use of insulin: Secondary | ICD-10-CM | POA: Insufficient documentation

## 2013-05-19 DIAGNOSIS — E119 Type 2 diabetes mellitus without complications: Secondary | ICD-10-CM | POA: Insufficient documentation

## 2013-05-19 DIAGNOSIS — G4730 Sleep apnea, unspecified: Secondary | ICD-10-CM | POA: Insufficient documentation

## 2013-05-19 DIAGNOSIS — G8918 Other acute postprocedural pain: Secondary | ICD-10-CM

## 2013-05-19 DIAGNOSIS — G547 Phantom limb syndrome without pain: Secondary | ICD-10-CM

## 2013-05-19 DIAGNOSIS — Z79899 Other long term (current) drug therapy: Secondary | ICD-10-CM | POA: Insufficient documentation

## 2013-05-19 DIAGNOSIS — I2510 Atherosclerotic heart disease of native coronary artery without angina pectoris: Secondary | ICD-10-CM | POA: Insufficient documentation

## 2013-05-19 DIAGNOSIS — S78119A Complete traumatic amputation at level between unspecified hip and knee, initial encounter: Secondary | ICD-10-CM | POA: Insufficient documentation

## 2013-05-19 DIAGNOSIS — Z0279 Encounter for issue of other medical certificate: Secondary | ICD-10-CM

## 2013-05-19 DIAGNOSIS — Z95810 Presence of automatic (implantable) cardiac defibrillator: Secondary | ICD-10-CM | POA: Insufficient documentation

## 2013-05-19 DIAGNOSIS — I739 Peripheral vascular disease, unspecified: Secondary | ICD-10-CM | POA: Insufficient documentation

## 2013-05-19 DIAGNOSIS — T8131XA Disruption of external operation (surgical) wound, not elsewhere classified, initial encounter: Secondary | ICD-10-CM

## 2013-05-19 DIAGNOSIS — G546 Phantom limb syndrome with pain: Secondary | ICD-10-CM

## 2013-05-19 MED ORDER — OXYCODONE-ACETAMINOPHEN 5-325 MG PO TABS: ORAL_TABLET | ORAL | Status: DC

## 2013-05-19 MED ORDER — GABAPENTIN 600 MG PO TABS: 600.0000 mg | ORAL_TABLET | Freq: Three times a day (TID) | ORAL | Status: DC

## 2013-05-19 NOTE — Progress Notes (Signed)
Subjective:    Patient ID: [name], male    DOB: [birth year], 79 y.o.   MRN: [id number]  HPI  [name] is back regarding his left AKA. He feels that his pain has been persistent, perhaps increased. The phantom limb pain is the worst at night. It often keeps him up at night.   He has had continued wound issues which ultimately required a vac and abx per surgery. He has a Higher education careers adviser following the wound currently. He likely to need another few weeks of wound care.   He also reports an occasional tremor in his hands. It was more severe yesterday but limited to only about 2 minutes in duration.    Pain Inventory Average Pain 9 Pain Right Now 5 My pain is sharp and stabbing  In the last 24 hours, has pain interfered with the following? General activity 0 Relation with others 0 Enjoyment of life 0 What TIME of day is your pain at its worst? evening Sleep (in general) Fair  Pain is worse with: some activites Pain improves with: rest Relief from Meds: 8  Mobility use a wheelchair  Function retired  Neuro/Psych trouble walking  Prior Studies Any changes since last visit?  yes  Physicians involved in your care [place]   Family History  Problem Relation Age of Onset  . Cancer      breast/fhx  . Heart disease      fhx  . Diabetes Neg Hx   . Cancer Father    History   Social History  . Marital Status: Widowed    Spouse Name: N/A    Number of Children: N/A  . Years of Education: N/A   Occupational History  . Retired    Social History Main Topics  . Smoking status: Never Smoker   . Smokeless tobacco: Never Used     Comment: 1-2 cigars when golfing  . Alcohol Use: 2.4 oz/week    2 Glasses of wine, 2 Shots of liquor per week     Comment: 07/10/2012 "galss of wine or vodka tonic 3 X/wk"  . Drug Use: No  . Sexual Activity: Not Currently   Other Topics Concern  . None   Social History Narrative   Lives in [place] with significant other,  [name].    Past Surgical History  Procedure Laterality Date  . Turp vaporization    . Cardiac defibrillator placement  12/26/09    pacemaker combo  . Cervical epidural injection  2013  . Femoral-tibial bypass graft  09/25/2011    Procedure: BYPASS GRAFT FEMORAL-TIBIAL ARTERY;  Surgeon: Pryor Ochoa, MD;  Location: [facility] OR;  Service: Vascular;  Laterality: Left;  Left Femoral - Anterior Tibial Bypass;  saphenous vein graft from left leg  . Intraoperative arteriogram  09/25/2011    Procedure: INTRA OPERATIVE ARTERIOGRAM;  Surgeon: Pryor Ochoa, MD;  Location: [facility] OR;  Service: Vascular;  Laterality: Left;  . Femoral-tibial bypass graft  02/07/2012    Procedure: BYPASS GRAFT FEMORAL-TIBIAL ARTERY;  Surgeon: Larina Earthly, MD;  Location: [facility] OR;  Service: Vascular;  Laterality: Left;  Thrombectomy Left Femoral - Tibial Bypass Graft  . Embolectomy  02/07/2012    Procedure: EMBOLECTOMY;  Surgeon: Larina Earthly, MD;  Location: [facility] OR;  Service: Vascular;  Laterality: Left;  . Femoral-tibial bypass graft  04/03/2012    Procedure: BYPASS GRAFT FEMORAL-TIBIAL ARTERY;  Surgeon: Pryor Ochoa, MD;  Location: [facility] OR;  Service: Vascular;  Laterality: Left;  Redo  . Insert / replace / remove pacemaker  2007  . Coronary angioplasty with stent placement  ~ 2000  . Coronary angioplasty    . Uvulopalatopharyngoplasty (uppp)/tonsillectomy/septoplasty  06/30/2003    Archie Endo 06/30/2003 (07/10/2012)  . Shoulder open rotator cuff repair Right 2001    repair of lacerated right/notes 10/11/1999  (07/10/2012)  . Foot surgery Right 03/20/2001    "have plates and screws in; didn't break it" (07/10/2012)  . Carpal tunnel release Right 2002    Archie Endo 03/20/2001 (07/10/2012)  . Biceps tendon repair Right 2001    Archie Endo 03/20/2001 (07/10/2012)  . Renal artery stent  2009  . Femoral-popliteal bypass graft Left 09/16/2012    Procedure: LEFT FEMORAL-POPLITEAL BYPASS GRAFT WITH GORTEX Propaten Graft 6x80 Thin Wall and Left lower leg Angiogram;  Surgeon:  Mal Misty, MD;  Location: [place];  Service: Vascular;  Laterality: Left;  . Colonoscopy      Hx; of  . Tonsillectomy    . Adenoidectomy      Hx; of   . Femoral-tibial bypass graft Left 09/30/2012    Procedure: REDO LEFT FEMORAL-ANTERIOR TIBIAL ARTERY BYPASS USING COMPOSITE CEPHALIC AND BASILIC VEIN GRAFT FROM LEFT ARM;  Surgeon: Mal Misty, MD;  Location: [place];  Service: Vascular;  Laterality: Left;  . I&d extremity Left 10/21/2012    Procedure: EXPLORATION AND DEBRIDEMENT OF LEFT GROIN WOUND;  Surgeon: Mal Misty, MD;  Location: [place];  Service: Vascular;  Laterality: Left;  . Patch angioplasty Left 10/21/2012    Procedure: PATCH ANGIOPLASTY;  Surgeon: Mal Misty, MD;  Location: [place];  Service: Vascular;  Laterality: Left;  . Groin debridement Left 11/12/2012    Procedure: CLOSURE INGUINAL WOUND;  Surgeon: Mal Misty, MD;  Location: [place];  Service: Vascular;  Laterality: Left;  . Embolectomy Left 12/16/2012    Procedure: THROMBECTOMY  LEFT LEG BYPASS;  Surgeon: Elam Dutch, MD;  Location: [place];  Service: Vascular;  Laterality: Left;  . Leg amputation above knee Left 02/04/2013    DR LAWSON  . Amputation Left 02/04/2013    Procedure: AMPUTATION ABOVE KNEE-LEFT;  Surgeon: Mal Misty, MD;  Location: [place];  Service: Vascular;  Laterality: Left;  . Removal of graft Left 04/16/2013    Procedure: I & D LEFT AKA WOUND, POSSIBLE REMOVAL OF INFECTED GORTEX GRAFT;  Surgeon: Mal Misty, MD;  Location: [place];  Service: Vascular;  Laterality: Left;   Past Medical History  Diagnosis Date  . Gout   . Benign prostatic hypertrophy     takes Proscar daily  . Atrial fibrillation     takes Warfarin daily  . Chronic systolic dysfunction of left ventricle     a. mixed ischemic and nonischemic CM,  EF 35%. b. s/p AICD implantation.  . ED (erectile dysfunction)   . Arthritis   . Pacemaker     medtronic  . CAD (coronary artery disease)     a. s/p mid LAD stenting with DES  2008 Dr. Daneen Schick  . ICD (implantable cardiac defibrillator) in place     medtronic, Dr. Rayann Heman  . ICD (implantable cardiac defibrillator) in place     due for check in Feb/2013  . Sleep apnea     hx of "had surgery for"  . Automatic implantable cardioverter-defibrillator in situ   . Depression   . Numbness and tingling     Hx; 97f left foot  . PAD (peripheral artery disease)  Severe by PV angiogram 09/2011  . Renal artery stenosis     s/p stenting 2009  . PAD (peripheral artery disease)     s/p multiple LLE bypass grafts; left mid-distal SCA occlusion by 08/2012 duplex  . Hypertension     takes Carvedilol and Losartan daily  . Constipation     takes Miralax daily as needed and Colace daily   . Type II diabetes mellitus     takes Novolog 70/30  . CHF (congestive heart failure)     takes Lasix daily  . History of MRSA infection   . Insomnia     TAKES TRAZODONE NIGHTLY   BP 100/78  Pulse 76  Resp 14  Ht 5\' 8"  (1.727 m)  Wt 162 lb (73.483 kg)  BMI 24.64 kg/m2  SpO2 98%  Opioid Risk Score:   Fall Risk Score: High Fall Risk (>13 points) (patient educated handout declined)   Review of Systems  Gastrointestinal: Positive for constipation.  Musculoskeletal: Positive for gait problem.  All other systems reviewed and are negative.       Objective:   Physical Exam          Constitutional: He is oriented to person, place, and time. He appears well-developed and well-nourished.  HENT:  Head: Normocephalic and atraumatic.  Eyes: Conjunctivae are normal. Pupils are equal, round, and reactive to light.  Neck: Neck supple.  Cardiovascular: Normal rate and regular rhythm. No rubs, gallops  No murmur heard.  Respiratory: Effort normal and breath sounds normal. No respiratory distress. He has no wheezes.  GI: Soft. Bowel sounds are normal. He exhibits no distension. There is no tenderness.  Musculoskeletal:  L-AKA with normal size, minimal swelling. Area is  tender  to palpation.  Neurological: He is alert and oriented to person, place, and time.  Follows commands. Strength grossly intact in UE's at 5/5. RLE is 4/5 prox to 5/5 distally. Left HF 4/5. mild sensory loss in distal right leg/foot.  Skin: Intact. Left AKA  Wound dressed. i did not remove Psychiatric: He has a normal mood and affect. His speech is normal and behavior is normal. Judgment and thought content normal. Cognition and memory are normal.    Assessment/Plan:  1. Functional deficits secondary to left AKA. This is patient is extremely motivated and a K3 ambulator. Follow up with [facility] for fitting once his wound is closer to healing, 2-3 weeks?  2. Pain Management: Increased gabapentin for phantom limb pain (600mg  tid)-- 2 at night, 1 in morning -prn percocet and tramadol to continue 3. Wound:  -wound care per surgery Follow up with me in about 2 months . 30 minutes of face to face patient care time were spent during this visit. All questions were encouraged and answered.

## 2013-05-19 NOTE — Patient Instructions (Signed)
PLEASE CALL ME WITH ANY PROBLEMS OR QUESTIONS (#[phone]).      

## 2013-05-21 ENCOUNTER — Telehealth: Payer: Self-pay | Admitting: Family Medicine

## 2013-05-21 MED ORDER — NITROGLYCERIN 0.4 MG SL SUBL: 0.4000 mg | SUBLINGUAL_TABLET | SUBLINGUAL | Status: DC | PRN

## 2013-05-21 NOTE — Telephone Encounter (Signed)
States pt NITROSTAT 0.4 MG SL tablet re-filled.  What he has is expired.  Please send to [facility] on [address]. (p) [phone]

## 2013-05-21 NOTE — Telephone Encounter (Signed)
Rx sent to pharmacy   

## 2013-05-24 ENCOUNTER — Telehealth: Payer: Self-pay

## 2013-05-24 NOTE — Telephone Encounter (Signed)
Hether called to [facility] aware bp is elevated today at 198/88 HR 72.  Pt thinks he may have missed carvediloll last night and but he took one this morning. It has been running 140, 158, and 180's. Pt told nurse that the last few days he has laid down he gets headaches and dizziness.  Pt's phantom pain has been rated at an 8 or 9 at night and he is not sleeping well.  Orders for pt can given to pt directly.

## 2013-05-24 NOTE — Telephone Encounter (Signed)
Called and spoke with pt and he is aware of recommendations.  Pt will resume normal medication regimen on tomorrow.  Pt verbalized understanding.

## 2013-05-24 NOTE — Telephone Encounter (Signed)
Take an extra dose of coreg at 12 pm if bloods pressure continues to be elevated.

## 2013-05-27 ENCOUNTER — Telehealth: Payer: Self-pay | Admitting: Family Medicine

## 2013-05-27 ENCOUNTER — Ambulatory Visit (INDEPENDENT_AMBULATORY_CARE_PROVIDER_SITE_OTHER): Payer: Medicare Other | Admitting: General Practice

## 2013-05-27 DIAGNOSIS — Z5181 Encounter for therapeutic drug level monitoring: Secondary | ICD-10-CM

## 2013-05-27 DIAGNOSIS — I4891 Unspecified atrial fibrillation: Secondary | ICD-10-CM

## 2013-05-27 LAB — POCT INR: INR: 1.5

## 2013-05-27 NOTE — Telephone Encounter (Signed)
Per Dr. Sarajane Jews, okay to schedule for Friday 05/28/13, pt is aware.

## 2013-05-27 NOTE — Progress Notes (Signed)
Pre visit review using our clinic review tool, if applicable. No additional management support is needed unless otherwise documented below in the visit note. 

## 2013-05-27 NOTE — Telephone Encounter (Signed)
Patient came in for a coumadin appt and wanted to see or speak to [place]. I advised patient that she was with other patients right now, but I could send a note back to her. Mr. Maione wants Sunday Spillers to be aware that he is still having tremors and that are getting worse. His contact number has been verified. Thanks!

## 2013-05-28 ENCOUNTER — Ambulatory Visit (INDEPENDENT_AMBULATORY_CARE_PROVIDER_SITE_OTHER): Payer: Medicare Other | Admitting: Family Medicine

## 2013-05-28 ENCOUNTER — Encounter: Payer: Self-pay | Admitting: Family

## 2013-05-28 ENCOUNTER — Encounter: Payer: Self-pay | Admitting: Family Medicine

## 2013-05-28 ENCOUNTER — Ambulatory Visit (INDEPENDENT_AMBULATORY_CARE_PROVIDER_SITE_OTHER): Payer: Medicare Other | Admitting: Family

## 2013-05-28 VITALS — BP 160/90 | HR 71 | Temp 98.0°F

## 2013-05-28 VITALS — BP 140/74 | HR 73 | Resp 18 | Ht 68.0 in | Wt 163.0 lb

## 2013-05-28 DIAGNOSIS — I739 Peripheral vascular disease, unspecified: Secondary | ICD-10-CM

## 2013-05-28 DIAGNOSIS — T8131XA Disruption of external operation (surgical) wound, not elsewhere classified, initial encounter: Secondary | ICD-10-CM

## 2013-05-28 DIAGNOSIS — R600 Localized edema: Secondary | ICD-10-CM

## 2013-05-28 DIAGNOSIS — I10 Essential (primary) hypertension: Secondary | ICD-10-CM

## 2013-05-28 DIAGNOSIS — R609 Edema, unspecified: Secondary | ICD-10-CM

## 2013-05-28 DIAGNOSIS — I4891 Unspecified atrial fibrillation: Secondary | ICD-10-CM

## 2013-05-28 MED ORDER — NITROGLYCERIN 0.4 MG SL SUBL: 0.4000 mg | SUBLINGUAL_TABLET | SUBLINGUAL | Status: DC | PRN

## 2013-05-28 MED ORDER — CARVEDILOL 12.5 MG PO TABS: 12.5000 mg | ORAL_TABLET | Freq: Two times a day (BID) | ORAL | Status: DC

## 2013-05-28 MED ORDER — FUROSEMIDE 40 MG PO TABS: 40.0000 mg | ORAL_TABLET | Freq: Every day | ORAL | Status: DC

## 2013-05-28 MED ORDER — LOSARTAN POTASSIUM 100 MG PO TABS
100.0000 mg | ORAL_TABLET | Freq: Every day | ORAL | Status: DC
Start: 2013-05-28 — End: 2014-04-15

## 2013-05-28 NOTE — Progress Notes (Signed)
[facility] [place] HISTORY AND PHYSICAL -PAD  History of Present Illness [name] is a 79 y.o. male patient of Dr. 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 On 04/16/2013 he was taken to the OR for an infected sinus tract of his left AKA stump, an I&D was performed. He had a wound vac in place until a few days ago, home health is changing his dressing 3x/week. He returns today re home health nurse reports surgical wound packing was greenish/yellowish and has an odor.  He denies fever or chills, denies pain at stump site.  He denies rest pain, the lesion on inner aspect right heel has healed.  He has phantom pain in left leg which he states is under fair control.   Taking coumadin.  He has been wearing knee high graduated compression hose as advised and states the swelling in his right lower leg has decreased. He rarely walks with a walker, gets around in a wheelchair.  He states that he wants to get going with getting fitted for his prosthesis.  Pt Diabetic: Yes, patient reports controlled  Pt smoker: non-smoker  Past Medical History  Diagnosis Date  . Gout   . Benign prostatic hypertrophy     takes Proscar daily  . Atrial fibrillation     takes Warfarin daily  . Chronic systolic dysfunction of left ventricle     a. mixed ischemic and nonischemic CM,  EF 35%. b. s/p AICD implantation.  . ED (erectile dysfunction)   . Arthritis   . Pacemaker     medtronic  . CAD (coronary artery disease)     a. s/p mid LAD stenting with DES 2008 Dr. Daneen Schick  . ICD (implantable cardiac defibrillator) in place     medtronic, Dr. Rayann Heman  . ICD (implantable cardiac defibrillator) in place     due for check in Feb/2013  . Sleep apnea     hx of "had surgery for"  . Automatic implantable cardioverter-defibrillator in situ   . Depression   . Numbness and tingling     Hx; 38f left foot  . PAD (peripheral artery disease)     Severe by PV angiogram 09/2011  . Renal  artery stenosis     s/p stenting 2009  . PAD (peripheral artery disease)     s/p multiple LLE bypass grafts; left mid-distal SCA occlusion by 08/2012 duplex  . Hypertension     takes Carvedilol and Losartan daily  . Constipation     takes Miralax daily as needed and Colace daily   . Type II diabetes mellitus     takes Novolog 70/30  . CHF (congestive heart failure)     takes Lasix daily  . History of MRSA infection   . Insomnia     TAKES TRAZODONE NIGHTLY    Social History History  Substance Use Topics  . Smoking status: Never Smoker   . Smokeless tobacco: Never Used     Comment: 1-2 cigars when golfing  . Alcohol Use: 2.4 oz/week    2 Glasses of wine, 2 Shots of liquor per week     Comment: 07/10/2012 "galss of wine or vodka tonic 3 X/wk"    Family History Family History  Problem Relation Age of Onset  . Cancer      breast/fhx  . Heart disease      fhx  . Diabetes Neg Hx   . Cancer Father     Past Surgical History  Procedure Laterality Date  . Turp vaporization    . Cardiac defibrillator placement  12/26/09    pacemaker combo  . Cervical epidural injection  2013  . Femoral-tibial bypass graft  09/25/2011    Procedure: BYPASS GRAFT FEMORAL-TIBIAL ARTERY;  Surgeon: Mal Misty, MD;  Location: [facility] OR;  Service: Vascular;  Laterality: Left;  Left Femoral - Anterior Tibial Bypass;  saphenous vein graft from left leg  . Intraoperative arteriogram  09/25/2011    Procedure: INTRA OPERATIVE ARTERIOGRAM;  Surgeon: Mal Misty, MD;  Location: [place];  Service: Vascular;  Laterality: Left;  . Femoral-tibial bypass graft  02/07/2012    Procedure: BYPASS GRAFT FEMORAL-TIBIAL ARTERY;  Surgeon: Rosetta Posner, MD;  Location: [place];  Service: Vascular;  Laterality: Left;  Thrombectomy Left Femoral - Tibial Bypass Graft  . Embolectomy  02/07/2012    Procedure: EMBOLECTOMY;  Surgeon: Rosetta Posner, MD;  Location: [place];  Service: Vascular;  Laterality: Left;  . Femoral-tibial bypass  graft  04/03/2012    Procedure: BYPASS GRAFT FEMORAL-TIBIAL ARTERY;  Surgeon: Mal Misty, MD;  Location: [place];  Service: Vascular;  Laterality: Left;  Redo  . Insert / replace / remove pacemaker  2007  . Coronary angioplasty with stent placement  ~ 2000  . Coronary angioplasty    . Uvulopalatopharyngoplasty (uppp)/tonsillectomy/septoplasty  06/30/2003    Archie Endo 06/30/2003 (07/10/2012)  . Shoulder open rotator cuff repair Right 2001    repair of lacerated right/notes 10/11/1999  (07/10/2012)  . Foot surgery Right 03/20/2001    "have plates and screws in; didn't break it" (07/10/2012)  . Carpal tunnel release Right 2002    Archie Endo 03/20/2001 (07/10/2012)  . Biceps tendon repair Right 2001    Archie Endo 03/20/2001 (07/10/2012)  . Renal artery stent  2009  . Femoral-popliteal bypass graft Left 09/16/2012    Procedure: LEFT FEMORAL-POPLITEAL BYPASS GRAFT WITH GORTEX Propaten Graft 6x80 Thin Wall and Left lower leg Angiogram;  Surgeon: Mal Misty, MD;  Location: [place];  Service: Vascular;  Laterality: Left;  . Colonoscopy      Hx; of  . Tonsillectomy    . Adenoidectomy      Hx; of   . Femoral-tibial bypass graft Left 09/30/2012    Procedure: REDO LEFT FEMORAL-ANTERIOR TIBIAL ARTERY BYPASS USING COMPOSITE CEPHALIC AND BASILIC VEIN GRAFT FROM LEFT ARM;  Surgeon: Mal Misty, MD;  Location: [place];  Service: Vascular;  Laterality: Left;  . I&d extremity Left 10/21/2012    Procedure: EXPLORATION AND DEBRIDEMENT OF LEFT GROIN WOUND;  Surgeon: Mal Misty, MD;  Location: [place];  Service: Vascular;  Laterality: Left;  . Patch angioplasty Left 10/21/2012    Procedure: PATCH ANGIOPLASTY;  Surgeon: Mal Misty, MD;  Location: [place];  Service: Vascular;  Laterality: Left;  . Groin debridement Left 11/12/2012    Procedure: CLOSURE INGUINAL WOUND;  Surgeon: Mal Misty, MD;  Location: [place];  Service: Vascular;  Laterality: Left;  . Embolectomy Left 12/16/2012    Procedure: THROMBECTOMY  LEFT LEG BYPASS;   Surgeon: Elam Dutch, MD;  Location: [place];  Service: Vascular;  Laterality: Left;  . Leg amputation above knee Left 02/04/2013    DR LAWSON  . Amputation Left 02/04/2013    Procedure: AMPUTATION ABOVE KNEE-LEFT;  Surgeon: Mal Misty, MD;  [facility] OR;  Service: Vascular;  Laterality: Left;  . Removal of graft Left 04/16/2013    Procedure: I & D LEFT AKA WOUND,  POSSIBLE REMOVAL OF INFECTED GORTEX GRAFT;  Surgeon: Mal Misty, MD;  Location: [place];  Service: Vascular;  Laterality: Left;    Allergies  Allergen Reactions  . Other Other (See Comments)    Plastic tape tears skin    Current Outpatient Prescriptions  Medication Sig Dispense Refill  . carvedilol (COREG) 12.5 MG tablet Take 1 tablet (12.5 mg total) by mouth 2 (two) times daily with a meal.  180 tablet  3  . Docusate Sodium (DSS) 100 MG CAPS Take 100 mg by mouth as needed. Stool softner--over there counter.  30 each  3  . finasteride (PROSCAR) 5 MG tablet Take 5 mg by mouth daily.      . furosemide (LASIX) 40 MG tablet Take 1 tablet (40 mg total) by mouth daily.  90 tablet  3  . gabapentin (NEURONTIN) 600 MG tablet Take 1 tablet (600 mg total) by mouth 3 (three) times daily.  90 tablet  3  . insulin aspart protamine- aspart (NOVOLOG MIX 70/30) (70-30) 100 UNIT/ML injection Inject 7 Units into the skin 2 (two) times daily with a meal.      . losartan (COZAAR) 100 MG tablet Take 1 tablet (100 mg total) by mouth daily.  90 tablet  3  . nitroGLYCERIN (NITROSTAT) 0.4 MG SL tablet Place 1 tablet (0.4 mg total) under the tongue every 5 (five) minutes as needed for chest pain. For chest pain, max 3 doses  90 tablet  3  . oxyCODONE-acetaminophen (PERCOCET/ROXICET) 5-325 MG per tablet Take 1-2 tablets every 6 hrs/ prn/ pain  90 tablet  0  . phytonadione (VITAMIN K) 5 MG tablet Take 5 mg by mouth once.      . polyethylene glycol (MIRALAX / GLYCOLAX) packet Take 17 g by mouth daily as needed (constipation).       . traMADol  (ULTRAM) 50 MG tablet Take 50 mg by mouth at bedtime.      . traZODone (DESYREL) 50 MG tablet Take 50 mg by mouth at bedtime.       Marland Kitchen warfarin (COUMADIN) 5 MG tablet Take 2.5 mg by mouth daily.       No current facility-administered medications for this visit.    ROS: See HPI for pertinent positives and negatives.   Physical Examination  Filed Vitals:   05/28/13 1518  BP: 140/74  Pulse: 73  Resp: 18   Filed Weights   05/28/13 1518  Weight: 163 lb (73.936 kg)   Body mass index is 24.79 kg/(m^2).  General: A&O x 3, WDWN, in wheelchair. Pulmonary: CTAB, without wheezes , rales or rhonchi. Cardiac: regular Rythm                       VASCULAR EXAM: Extremities without ischemic changes  without Gangrene; left AKA stump packing removed and has sero-sanguinous drainage, no purulent drainage, no foul odor.  LE Pulses LEFT RIGHT       FEMORAL   palpable   palpable        POPLITEAL AKA   not palpable       POSTERIOR TIBIAL  AKA   not palpable        DORSALIS PEDIS      ANTERIOR TIBIAL AKA  not palpable    Abdomen: soft, NT, no masses. Skin: no rashes, no ulcers noted. Musculoskeletal: no muscle wasting or atrophy.  Neurologic: A&O X 3; Appropriate Affect ; SENSATION: normal; MOTOR FUNCTION:  moving all extremities equally. Speech is fluent/normal. CN 2-12 grossly  intact.  ASSESSMENT: [name] is a 79 y.o. male is s/p left AKA Feb 04, 2013. On 04/16/2013 he was taken to the OR for an infected sinus tract of his left AKA stump, an I&D was performed. Suture removed from left AKA I&D site. Wound packed with 1/4 inch gauze. No signs of infection.  PLAN:  Continue wound care by home health.  I discussed in depth with the patient the nature of atherosclerosis, and emphasized the importance of maximal medical management including strict control of blood pressure,  blood glucose, and lipid levels, obtaining regular exercise, and continued cessation of smoking.  The patient is aware that without maximal medical management the underlying atherosclerotic disease process will progress, limiting the benefit of any interventions.  Based on the patient's vascular studies and examination, pt is already scheduled to return in 4 months for right ABI, and scheduled with Dr. Kellie Simmering on 06/15/2013.  Clemon Chambers, RN, MSN, FNP-C [facility] Phone: [phone]  Clinic MD: Bridgett Larsson  05/28/2013 3:47 PM

## 2013-05-28 NOTE — Progress Notes (Signed)
   Subjective:    Patient ID: SAJAN CHEATWOOD, male    DOB: [birth year], 79 y.o.   MRN: [id number]  HPI Here to discuss his BP. He has often had systolic readings in the 536I for the past few months, but today the home health nurse checked it and it was 200/90. He has had a mild headache for a few days but denies any chest pain or SOB. He admits to not taking lasix for the past 4 days. After they called [place] this morning he did take a Lasix and now the systolic pressure is down to 160 and the HA is gone.    Review of Systems  Constitutional: Negative.   Respiratory: Negative.   Cardiovascular: Negative.        Objective:   Physical Exam  Constitutional: He appears well-developed and well-nourished.  Cardiovascular: Normal rate, regular rhythm, normal heart sounds and intact distal pulses.   Pulmonary/Chest: Effort normal and breath sounds normal.  Musculoskeletal: He exhibits no edema.          Assessment & Plan:  I encouraged him to take the Lasix every day, and we will increase the Carvedilol to 12.5 mg bid.

## 2013-05-28 NOTE — Progress Notes (Signed)
Pre visit review using our clinic review tool, if applicable. No additional management support is needed unless otherwise documented below in the visit note. 

## 2013-05-31 ENCOUNTER — Telehealth: Payer: Self-pay | Admitting: *Deleted

## 2013-05-31 NOTE — Telephone Encounter (Signed)
Heather, a nurse with [facility] called stating upon seeing patient today the wound had closed and she had concerns it was closing  from the top and tunneling at the base.  She was able to measure the depth and it was 1.5 cm. She stated on Friday, 05/28/13 the depth was 3.1cm.  I informed Dr Kellie Simmering and he instructed to have Nira Conn continue packing as she has and to notify if changes occur and for patient to keep her follow up appointment.

## 2013-06-09 ENCOUNTER — Telehealth: Payer: Self-pay | Admitting: Family Medicine

## 2013-06-09 NOTE — Telephone Encounter (Signed)
[name] would like to inform md that pt  is not consistently taking his lasix. Pt is not having any symptom such as SOB,no edema. Pt advise.

## 2013-06-09 NOTE — Telephone Encounter (Signed)
I spoke with Nira Conn from [facility] and she just wanted Dr. Sarajane Jews to be aware that pt does not take the Lasix every day. He has not taken it in about 2 days, when advised to take prescribed medications like the Lasix, pt said that he will take it when he needs it. He does not like taking this particular medication every day. He is not having any symptoms at this time. This is just a FYI.

## 2013-06-10 ENCOUNTER — Ambulatory Visit (INDEPENDENT_AMBULATORY_CARE_PROVIDER_SITE_OTHER): Payer: Medicare Other | Admitting: General Practice

## 2013-06-10 DIAGNOSIS — I4891 Unspecified atrial fibrillation: Secondary | ICD-10-CM

## 2013-06-10 DIAGNOSIS — Z5181 Encounter for therapeutic drug level monitoring: Secondary | ICD-10-CM

## 2013-06-10 LAB — POCT INR: INR: 2.3

## 2013-06-10 NOTE — Progress Notes (Signed)
Pre visit review using our clinic review tool, if applicable. No additional management support is needed unless otherwise documented below in the visit note. 

## 2013-06-14 ENCOUNTER — Encounter: Payer: Self-pay | Admitting: Vascular Surgery

## 2013-06-15 ENCOUNTER — Ambulatory Visit (INDEPENDENT_AMBULATORY_CARE_PROVIDER_SITE_OTHER): Payer: Medicare Other | Admitting: Vascular Surgery

## 2013-06-15 ENCOUNTER — Encounter: Payer: Self-pay | Admitting: Vascular Surgery

## 2013-06-15 ENCOUNTER — Ambulatory Visit: Payer: Medicare Other | Admitting: Vascular Surgery

## 2013-06-15 VITALS — BP 101/72 | HR 71 | Ht 68.0 in | Wt 163.0 lb

## 2013-06-15 DIAGNOSIS — T8131XA Disruption of external operation (surgical) wound, not elsewhere classified, initial encounter: Secondary | ICD-10-CM

## 2013-06-15 DIAGNOSIS — Z48812 Encounter for surgical aftercare following surgery on the circulatory system: Secondary | ICD-10-CM

## 2013-06-15 DIAGNOSIS — I739 Peripheral vascular disease, unspecified: Secondary | ICD-10-CM

## 2013-06-15 NOTE — Progress Notes (Signed)
Subjective:     Patient ID: [name], male   DOB: [birth year], 79 y.o.   MRN: [id number]  HPI this 79 year old male returns for continued followup regarding his left AKA which was performed in December of 2014 N. required incision and drainage of the stump 04/16/2013. That has now healed nicely with inability to pack the sinus tract. He's had no chills and fever or tenderness and the proximal stump. He is ready to start with prosthetic training. He denies any symptoms in the contralateral right leg.  Review of Systems     Objective:   Physical Exam BP 101/72  Pulse 71  Ht 5\' 8"  (1.727 m)  Wt 163 lb (73.936 kg)  BMI 24.79 kg/m2  SpO2 100%  General well-developed well-nourished male no apparent stress alert and oriented x3 Left AKA appears completely healed now. Unable to pass swab into depth of track. Clean and uninfected. Right lower extremity free of ischemia.     Assessment:     Okay to begin prosthetic training next week DC home health nurse visits after this week    Plan:     Return to see me in 6 months with ABI of contralateral right leg

## 2013-06-17 NOTE — Addendum Note (Signed)
Addended by: Dorthula Rue L on: 06/17/2013 11:39 AM   Modules accepted: Orders

## 2013-06-29 ENCOUNTER — Encounter: Payer: Medicare Other | Admitting: Vascular Surgery

## 2013-07-08 ENCOUNTER — Ambulatory Visit (INDEPENDENT_AMBULATORY_CARE_PROVIDER_SITE_OTHER): Payer: Medicare Other | Admitting: General Practice

## 2013-07-08 DIAGNOSIS — I4891 Unspecified atrial fibrillation: Secondary | ICD-10-CM

## 2013-07-08 DIAGNOSIS — Z5181 Encounter for therapeutic drug level monitoring: Secondary | ICD-10-CM

## 2013-07-08 LAB — POCT INR: INR: 1.6

## 2013-07-08 NOTE — Progress Notes (Signed)
Pre visit review using our clinic review tool, if applicable. No additional management support is needed unless otherwise documented below in the visit note. 

## 2013-07-19 ENCOUNTER — Encounter: Payer: Self-pay | Admitting: Physical Medicine & Rehabilitation

## 2013-07-19 ENCOUNTER — Encounter: Payer: Medicare Other | Attending: Physical Medicine & Rehabilitation | Admitting: Physical Medicine & Rehabilitation

## 2013-07-19 VITALS — BP 145/87 | HR 68 | Resp 14 | Ht 68.0 in | Wt 165.0 lb

## 2013-07-19 DIAGNOSIS — Z5189 Encounter for other specified aftercare: Secondary | ICD-10-CM | POA: Insufficient documentation

## 2013-07-19 DIAGNOSIS — G546 Phantom limb syndrome with pain: Secondary | ICD-10-CM

## 2013-07-19 DIAGNOSIS — S78119A Complete traumatic amputation at level between unspecified hip and knee, initial encounter: Secondary | ICD-10-CM

## 2013-07-19 DIAGNOSIS — T8131XA Disruption of external operation (surgical) wound, not elsewhere classified, initial encounter: Secondary | ICD-10-CM

## 2013-07-19 DIAGNOSIS — G547 Phantom limb syndrome without pain: Secondary | ICD-10-CM | POA: Insufficient documentation

## 2013-07-19 DIAGNOSIS — G609 Hereditary and idiopathic neuropathy, unspecified: Secondary | ICD-10-CM

## 2013-07-19 DIAGNOSIS — X58XXXA Exposure to other specified factors, initial encounter: Secondary | ICD-10-CM | POA: Insufficient documentation

## 2013-07-19 DIAGNOSIS — E1059 Type 1 diabetes mellitus with other circulatory complications: Secondary | ICD-10-CM

## 2013-07-19 DIAGNOSIS — G8918 Other acute postprocedural pain: Secondary | ICD-10-CM | POA: Insufficient documentation

## 2013-07-19 MED ORDER — OXYCODONE-ACETAMINOPHEN 5-325 MG PO TABS: ORAL_TABLET | ORAL | Status: DC

## 2013-07-19 NOTE — Progress Notes (Signed)
Subjective:    Patient ID: [name], male    DOB: 1935/01/26, 79 y.o.   MRN: [id number]  HPI  [name] is back regarding his left AKA. He has continued to have phantom limb pain particularly at night. Dr. Sarajane Jews decreased his gabapentin but the patient is unclear as to exactly why. The percocet does seem to control his pain. Sometimes he needs two at night.   He is awaiting further fitting per [facility]. He goes in tomorrow apparently.   The AKA wound has finally healed.  Pain Inventory Average Pain 8 Pain Right Now 5 My pain is intermittent, stabbing and numbness  In the last 24 hours, has pain interfered with the following? General activity 7 Relation with others 7 Enjoyment of life 7 What TIME of day is your pain at its worst? evening Sleep (in general) Good  Pain is worse with: sleeping Pain improves with: sitting up Relief from Meds: 10  Mobility use a wheelchair  Function retired  Neuro/Psych numbness trouble walking  Prior Studies Any changes since last visit?  no  Physicians involved in your care Any changes since last visit?  no   Family History  Problem Relation Age of Onset  . Cancer      breast/fhx  . Heart disease      fhx  . Diabetes Neg Hx   . Cancer Father    History   Social History  . Marital Status: Widowed    Spouse Name: N/A    Number of Children: N/A  . Years of Education: N/A   Occupational History  . Retired    Social History Main Topics  . Smoking status: Never Smoker   . Smokeless tobacco: Never Used     Comment: 1-2 cigars when golfing  . Alcohol Use: 2.4 oz/week    2 Glasses of wine, 2 Shots of liquor per week     Comment: 07/10/2012 "galss of wine or vodka tonic 3 X/wk"  . Drug Use: No  . Sexual Activity: Not Currently   Other Topics Concern  . None   Social History Narrative   Lives in [place] with significant other,  [name].   Past Surgical History  Procedure Laterality Date  . Turp  vaporization    . Cardiac defibrillator placement  12/26/09    pacemaker combo  . Cervical epidural injection  2013  . Femoral-tibial bypass graft  09/25/2011    Procedure: BYPASS GRAFT FEMORAL-TIBIAL ARTERY;  Surgeon: Mal Misty, MD;  Location: [facility] OR;  Service: Vascular;  Laterality: Left;  Left Femoral - Anterior Tibial Bypass;  saphenous vein graft from left leg  . Intraoperative arteriogram  09/25/2011    Procedure: INTRA OPERATIVE ARTERIOGRAM;  Surgeon: Mal Misty, MD;  Location: [place];  Service: Vascular;  Laterality: Left;  . Femoral-tibial bypass graft  02/07/2012    Procedure: BYPASS GRAFT FEMORAL-TIBIAL ARTERY;  Surgeon: Rosetta Posner, MD;  Location: [place];  Service: Vascular;  Laterality: Left;  Thrombectomy Left Femoral - Tibial Bypass Graft  . Embolectomy  02/07/2012    Procedure: EMBOLECTOMY;  Surgeon: Rosetta Posner, MD;  Location: [place];  Service: Vascular;  Laterality: Left;  . Femoral-tibial bypass graft  04/03/2012    Procedure: BYPASS GRAFT FEMORAL-TIBIAL ARTERY;  Surgeon: Mal Misty, MD;  Location: [place];  Service: Vascular;  Laterality: Left;  Redo  . Insert / replace / remove pacemaker  2007  . Coronary angioplasty with stent placement  ~  2000  . Coronary angioplasty    . Uvulopalatopharyngoplasty (uppp)/tonsillectomy/septoplasty  06/30/2003    Archie Endo 06/30/2003 (07/10/2012)  . Shoulder open rotator cuff repair Right 2001    repair of lacerated right/notes 10/11/1999  (07/10/2012)  . Foot surgery Right 03/20/2001    "have plates and screws in; didn't break it" (07/10/2012)  . Carpal tunnel release Right 2002    Archie Endo 03/20/2001 (07/10/2012)  . Biceps tendon repair Right 2001    Archie Endo 03/20/2001 (07/10/2012)  . Renal artery stent  2009  . Femoral-popliteal bypass graft Left 09/16/2012    Procedure: LEFT FEMORAL-POPLITEAL BYPASS GRAFT WITH GORTEX Propaten Graft 6x80 Thin Wall and Left lower leg Angiogram;  Surgeon: Mal Misty, MD;  Location: [place];  Service: Vascular;   Laterality: Left;  . Colonoscopy      Hx; of  . Tonsillectomy    . Adenoidectomy      Hx; of   . Femoral-tibial bypass graft Left 09/30/2012    Procedure: REDO LEFT FEMORAL-ANTERIOR TIBIAL ARTERY BYPASS USING COMPOSITE CEPHALIC AND BASILIC VEIN GRAFT FROM LEFT ARM;  Surgeon: Mal Misty, MD;  Location: [place];  Service: Vascular;  Laterality: Left;  . I&d extremity Left 10/21/2012    Procedure: EXPLORATION AND DEBRIDEMENT OF LEFT GROIN WOUND;  Surgeon: Mal Misty, MD;  Location: [place];  Service: Vascular;  Laterality: Left;  . Patch angioplasty Left 10/21/2012    Procedure: PATCH ANGIOPLASTY;  Surgeon: Mal Misty, MD;  Location: [place];  Service: Vascular;  Laterality: Left;  . Groin debridement Left 11/12/2012    Procedure: CLOSURE INGUINAL WOUND;  Surgeon: Mal Misty, MD;  Location: [place];  Service: Vascular;  Laterality: Left;  . Embolectomy Left 12/16/2012    Procedure: THROMBECTOMY  LEFT LEG BYPASS;  Surgeon: Elam Dutch, MD;  Location: [place];  Service: Vascular;  Laterality: Left;  . Leg amputation above knee Left 02/04/2013    DR LAWSON  . Amputation Left 02/04/2013    Procedure: AMPUTATION ABOVE KNEE-LEFT;  Surgeon: Mal Misty, MD;  Location: [place];  Service: Vascular;  Laterality: Left;  . Removal of graft Left 04/16/2013    Procedure: I & D LEFT AKA WOUND, POSSIBLE REMOVAL OF INFECTED GORTEX GRAFT;  Surgeon: Mal Misty, MD;  Location: [place];  Service: Vascular;  Laterality: Left;   Past Medical History  Diagnosis Date  . Gout   . Benign prostatic hypertrophy     takes Proscar daily  . Atrial fibrillation     takes Warfarin daily  . Chronic systolic dysfunction of left ventricle     a. mixed ischemic and nonischemic CM,  EF 35%. b. s/p AICD implantation.  . ED (erectile dysfunction)   . Arthritis   . Pacemaker     medtronic  . CAD (coronary artery disease)     a. s/p mid LAD stenting with DES 2008 Dr. Daneen Schick  . ICD (implantable cardiac  defibrillator) in place     medtronic, Dr. Rayann Heman  . ICD (implantable cardiac defibrillator) in place     due for check in Feb/2013  . Sleep apnea     hx of "had surgery for"  . Automatic implantable cardioverter-defibrillator in situ   . Depression   . Numbness and tingling     Hx; 56f left foot  . PAD (peripheral artery disease)     Severe by PV angiogram 09/2011  . Renal artery stenosis     s/p stenting 2009  .  PAD (peripheral artery disease)     s/p multiple LLE bypass grafts; left mid-distal SCA occlusion by 08/2012 duplex  . Hypertension     takes Carvedilol and Losartan daily  . Constipation     takes Miralax daily as needed and Colace daily   . Type II diabetes mellitus     takes Novolog 70/30  . CHF (congestive heart failure)     takes Lasix daily  . History of MRSA infection   . Insomnia     TAKES TRAZODONE NIGHTLY   BP 145/87  Pulse 68  Resp 14  Ht 5\' 8"  (1.727 m)  Wt 165 lb (74.844 kg)  BMI 25.09 kg/m2  SpO2 100%  Opioid Risk Score:   Fall Risk Score: Low Fall Risk (0-5 points) (pt educated on fall risk, brochure given to pt previously)   Review of Systems  Musculoskeletal: Positive for gait problem.  Neurological: Positive for numbness.  All other systems reviewed and are negative.      Objective:   Physical Exam  Constitutional: He is oriented to person, place, and time. He appears well-developed and well-nourished.  HENT:  Head: Normocephalic and atraumatic.  Eyes: Conjunctivae are normal. Pupils are equal, round, and reactive to light.  Neck: Neck supple.  Cardiovascular: Normal rate and regular rhythm. No rubs, gallops  No murmur heard.  Respiratory: Effort normal and breath sounds normal. No respiratory distress. He has no wheezes.  GI: Soft. Bowel sounds are normal. He exhibits no distension. There is no tenderness.  Musculoskeletal:  L-AKA  is less tender to palpation.  Neurological: He is alert and oriented to person, place, and time.    Follows commands. Strength grossly intact in UE's at 5/5. RLE is 4/5 prox to 5/5 distally. Left HF 4/5. mild sensory loss in distal right leg/foot.  Skin: Intact. Left AKA Wound healed. Good shape Psychiatric: He has a normal mood and affect. His speech is normal and behavior is normal. Judgment and thought content normal. Cognition and memory are normal.    Assessment/Plan:  1. Functional deficits secondary to left AKA. This is patient is extremely motivated and a K3 ambulator. Follow up with [facility] tomorrow. He should do well.  2. Pain Management: for phantom limb pain (600mg  bid for now)---i am unclear as to why it was decreased. Do not wish to add TCA's or SSNRI given CV history -prn percocet and tramadol to continue----may be a little more liberal with percocet at night as needed -discussed aggressive massage and visual feed back therapy and prosthetic wear 3. Wound:  -now healed. In the midst of fitting practice 4. Follow up with me in about 2 months . 15 minutes of face to face patient care time were spent during this visit. All questions were encouraged and answered.

## 2013-07-19 NOTE — Patient Instructions (Signed)
WORK AGGRESSIVELY ON MASSAGE, VISUAL FEEDBACK THERAPY  ALSO WEAR YOUR LEG EVERY DAY.

## 2013-07-28 ENCOUNTER — Other Ambulatory Visit: Payer: Self-pay | Admitting: *Deleted

## 2013-07-28 DIAGNOSIS — I739 Peripheral vascular disease, unspecified: Secondary | ICD-10-CM

## 2013-08-02 ENCOUNTER — Encounter: Payer: Self-pay | Admitting: Interventional Cardiology

## 2013-08-02 ENCOUNTER — Ambulatory Visit (INDEPENDENT_AMBULATORY_CARE_PROVIDER_SITE_OTHER): Payer: Medicare Other | Admitting: *Deleted

## 2013-08-02 ENCOUNTER — Ambulatory Visit (INDEPENDENT_AMBULATORY_CARE_PROVIDER_SITE_OTHER): Payer: Medicare Other | Admitting: Interventional Cardiology

## 2013-08-02 ENCOUNTER — Ambulatory Visit: Payer: Medicare Other | Attending: Physical Medicine & Rehabilitation | Admitting: Physical Therapy

## 2013-08-02 ENCOUNTER — Encounter: Payer: Self-pay | Admitting: Internal Medicine

## 2013-08-02 VITALS — BP 140/80 | HR 60 | Ht 68.0 in | Wt 179.0 lb

## 2013-08-02 DIAGNOSIS — R269 Unspecified abnormalities of gait and mobility: Secondary | ICD-10-CM | POA: Insufficient documentation

## 2013-08-02 DIAGNOSIS — Z7901 Long term (current) use of anticoagulants: Secondary | ICD-10-CM

## 2013-08-02 DIAGNOSIS — I4891 Unspecified atrial fibrillation: Secondary | ICD-10-CM

## 2013-08-02 DIAGNOSIS — Z4789 Encounter for other orthopedic aftercare: Secondary | ICD-10-CM | POA: Insufficient documentation

## 2013-08-02 DIAGNOSIS — R5381 Other malaise: Secondary | ICD-10-CM | POA: Diagnosis not present

## 2013-08-02 DIAGNOSIS — IMO0001 Reserved for inherently not codable concepts without codable children: Secondary | ICD-10-CM | POA: Diagnosis not present

## 2013-08-02 DIAGNOSIS — I255 Ischemic cardiomyopathy: Secondary | ICD-10-CM

## 2013-08-02 DIAGNOSIS — I2510 Atherosclerotic heart disease of native coronary artery without angina pectoris: Secondary | ICD-10-CM

## 2013-08-02 DIAGNOSIS — I5022 Chronic systolic (congestive) heart failure: Secondary | ICD-10-CM

## 2013-08-02 DIAGNOSIS — Z95810 Presence of automatic (implantable) cardiac defibrillator: Secondary | ICD-10-CM

## 2013-08-02 DIAGNOSIS — I2589 Other forms of chronic ischemic heart disease: Secondary | ICD-10-CM

## 2013-08-02 DIAGNOSIS — I509 Heart failure, unspecified: Secondary | ICD-10-CM

## 2013-08-02 LAB — MDC_IDC_ENUM_SESS_TYPE_INCLINIC
Brady Statistic AP VP Percent: 0 %
Brady Statistic AS VP Percent: 99.88 %
Brady Statistic AS VS Percent: 0.12 %
HIGH POWER IMPEDANCE MEASURED VALUE: 437 Ohm
HighPow Impedance: 171 Ohm
HighPow Impedance: 43 Ohm
HighPow Impedance: 56 Ohm
Lead Channel Impedance Value: 323 Ohm
Lead Channel Impedance Value: 437 Ohm
Lead Channel Impedance Value: 494 Ohm
Lead Channel Impedance Value: 513 Ohm
Lead Channel Impedance Value: 817 Ohm
Lead Channel Pacing Threshold Amplitude: 0.75 V
Lead Channel Pacing Threshold Amplitude: 1.375 V
Lead Channel Sensing Intrinsic Amplitude: 0.25 mV
Lead Channel Sensing Intrinsic Amplitude: 14.125 mV
Lead Channel Setting Pacing Amplitude: 2.5 V
Lead Channel Setting Pacing Amplitude: 2.5 V
Lead Channel Setting Pacing Pulse Width: 0.4 ms
Lead Channel Setting Pacing Pulse Width: 0.4 ms
Lead Channel Setting Sensing Sensitivity: 0.3 mV
MDC IDC MSMT BATTERY VOLTAGE: 2.64 V
MDC IDC MSMT LEADCHNL LV PACING THRESHOLD PULSEWIDTH: 0.4 ms
MDC IDC MSMT LEADCHNL RA SENSING INTR AMPL: 0.5 mV
MDC IDC MSMT LEADCHNL RV PACING THRESHOLD PULSEWIDTH: 0.4 ms
MDC IDC MSMT LEADCHNL RV SENSING INTR AMPL: 14.125 mV
MDC IDC SESS DTM: 20150601142726
MDC IDC SET ZONE DETECTION INTERVAL: 290 ms
MDC IDC SET ZONE DETECTION INTERVAL: 450 ms
MDC IDC STAT BRADY AP VS PERCENT: 0 %
MDC IDC STAT BRADY RA PERCENT PACED: 0 %
MDC IDC STAT BRADY RV PERCENT PACED: 99.88 %
Zone Setting Detection Interval: 320 ms
Zone Setting Detection Interval: 350 ms

## 2013-08-02 NOTE — Progress Notes (Signed)
CRT-D device check in office. Thresholds and sensing consistent with previous device measurements. Lead impedance trends stable over time. 100 AT/AF + coumadin. No ventricular arrhythmia episodes recorded. Patient bi-ventricularly pacing 99.9% of the time. Device programmed with appropriate safety margins. Heart failure diagnostics reviewed and trends are stable for patient. Audible alerts demonstrated for patient, pt knows to call clinic if heard. No changes made this session. Battery @2 .64V (ERI = 2.63V). Accelerated follow up: next [facility] 09/01/13.

## 2013-08-02 NOTE — Patient Instructions (Signed)
Your physician recommends that you continue on your current medications as directed. Please refer to the Current Medication list given to you today.  Your physician wants you to follow-up in: 6-9 months You will receive a reminder letter in the mail two months in advance. If you don't receive a letter, please call our office to schedule the follow-up appointment.  

## 2013-08-02 NOTE — Progress Notes (Signed)
Patient ID: [name], male   DOB: Jul 07, 1934, 79 y.o.   MRN: [id number]    [address], [address] [place], [place]  [address] Phone: [phone] Fax:  [phone]  Date:  08/02/2013   ID:  [name], DOB 1934/07/04, MRN [id number]  PCP:  Laurey Morale, MD   ASSESSMENT:  1. Chronic systolic heart failure, stable without evidence of volume overload 2. Coronary artery disease without angina 3. Recent left lower extremity amputation due to to PAD and critical limb ischemia 4. Chronic atrial fibrillation 5. Chronic Coumadin therapy  PLAN:  1. Patient is doing well from a heart failure standpoint. No angina or rhythm issues. No change in therapy is warranted. 2. I encouraged patient to vigorously approaches rehabilitation to ensure that he gets as much mobility as possible related to his new left lower extremity prosthesis. 3. I acknowledged the great opportunity for him to become depressed and encouraged him to bring any feelings of depression/isolation to the attention of his physicians. 4. Clinical followup in 6-9 months   SUBJECTIVE: [name] is a 79 y.o. male who denies orthopnea, PND, and lower extremity swelling. He has not had angina or needed to use nitroglycerin. He is status left AKA and now has been fitted for his new prosthesis which he wears into the office today. He has been troubled by phantom pain. He feels sad and occasionally alone. He does have friends who facilitated out of house trips and transport back and forth to his physician appointments.   Wt Readings from Last 3 Encounters:  08/02/13 179 lb (81.194 kg)  07/19/13 165 lb (74.844 kg)  06/15/13 163 lb (73.936 kg)     Past Medical History  Diagnosis Date  . Gout   . Benign prostatic hypertrophy     takes Proscar daily  . Atrial fibrillation     takes Warfarin daily  . Chronic systolic dysfunction of left ventricle     a. mixed ischemic and nonischemic CM,  EF 35%. b. s/p AICD  implantation.  . ED (erectile dysfunction)   . Arthritis   . Pacemaker     medtronic  . CAD (coronary artery disease)     a. s/p mid LAD stenting with DES 2008 Dr. Daneen Schick  . ICD (implantable cardiac defibrillator) in place     medtronic, Dr. Rayann Heman  . ICD (implantable cardiac defibrillator) in place     due for check in Feb/2013  . Sleep apnea     hx of "had surgery for"  . Automatic implantable cardioverter-defibrillator in situ   . Depression   . Numbness and tingling     Hx; 41f left foot  . PAD (peripheral artery disease)     Severe by PV angiogram 09/2011  . Renal artery stenosis     s/p stenting 2009  . PAD (peripheral artery disease)     s/p multiple LLE bypass grafts; left mid-distal SCA occlusion by 08/2012 duplex  . Hypertension     takes Carvedilol and Losartan daily  . Constipation     takes Miralax daily as needed and Colace daily   . Type II diabetes mellitus     takes Novolog 70/30  . CHF (congestive heart failure)     takes Lasix daily  . History of MRSA infection   . Insomnia     TAKES TRAZODONE NIGHTLY    Current Outpatient Prescriptions  Medication Sig Dispense Refill  . carvedilol (COREG) 12.5 MG tablet  Take 1 tablet (12.5 mg total) by mouth 2 (two) times daily with a meal.  180 tablet  3  . finasteride (PROSCAR) 5 MG tablet Take 5 mg by mouth daily.      . furosemide (LASIX) 40 MG tablet Take 1 tablet (40 mg total) by mouth daily.  90 tablet  3  . gabapentin (NEURONTIN) 600 MG tablet Take 1 tablet (600 mg total) by mouth 3 (three) times daily.  90 tablet  3  . insulin aspart protamine- aspart (NOVOLOG MIX 70/30) (70-30) 100 UNIT/ML injection Inject 7 Units into the skin 2 (two) times daily with a meal.      . losartan (COZAAR) 100 MG tablet Take 1 tablet (100 mg total) by mouth daily.  90 tablet  3  . nitroGLYCERIN (NITROSTAT) 0.4 MG SL tablet Place 1 tablet (0.4 mg total) under the tongue every 5 (five) minutes as needed for chest pain. For chest  pain, max 3 doses  90 tablet  3  . oxyCODONE-acetaminophen (PERCOCET/ROXICET) 5-325 MG per tablet Take 1-2 tablets every 6 hrs/ prn/ pain  90 tablet  0  . traMADol (ULTRAM) 50 MG tablet Take 50 mg by mouth at bedtime.      . traZODone (DESYREL) 50 MG tablet Take 50 mg by mouth at bedtime.       Marland Kitchen warfarin (COUMADIN) 5 MG tablet Take 2.5 mg by mouth daily.       No current facility-administered medications for this visit.    Allergies:    Allergies  Allergen Reactions  . Other Other (See Comments)    Plastic tape tears skin    Social History:  The patient  reports that he has never smoked. He has never used smokeless tobacco. He reports that he drinks about 2.4 ounces of alcohol per week. He reports that he does not use illicit drugs.   ROS:  Please see the history of present illness.   He denies syncope. No blood in urine or stool. No transient neurological symptoms.   All other systems reviewed and negative.   OBJECTIVE: VS:  BP 140/80  Pulse 60  Ht 5\' 8"  (1.727 m)  Wt 179 lb (81.194 kg)  BMI 27.22 kg/m2 Well nourished, well developed, in no acute distress, appears healthy and notable left lower extremity prosthesis HEENT: normal Neck: JVD flat. Carotid bruit absent bruits  Cardiac:  normal S1, S2; IIRR; no murmur Lungs:  clear to auscultation bilaterally, no wheezing, rhonchi or rales Abd: soft, nontender, no hepatomegaly Ext: Edema absent in the right lower extremity. Left lower extremity is a prosthesis. Pulses absent in right lower extremity Skin: warm and dry Neuro:  CNs 2-12 intact, no focal abnormalities noted  EKG:  Not performed       Signed, Illene Labrador III, MD 08/02/2013 11:24 AM

## 2013-08-03 ENCOUNTER — Telehealth: Payer: Self-pay

## 2013-08-05 ENCOUNTER — Ambulatory Visit (INDEPENDENT_AMBULATORY_CARE_PROVIDER_SITE_OTHER): Payer: Medicare Other | Admitting: General Practice

## 2013-08-05 ENCOUNTER — Telehealth: Payer: Self-pay | Admitting: Family Medicine

## 2013-08-05 DIAGNOSIS — Z5181 Encounter for therapeutic drug level monitoring: Secondary | ICD-10-CM

## 2013-08-05 DIAGNOSIS — I4891 Unspecified atrial fibrillation: Secondary | ICD-10-CM

## 2013-08-05 LAB — POCT INR: INR: 1.8

## 2013-08-05 NOTE — Telephone Encounter (Signed)
Pt states he has already bought the scooter, and [employer] states they will reimburse pt if he can get them a script and he will send his bill ofsale along w/ the rx. Pt states he would love to be able to pick up the rx on Friday 6/5. Pt has someone to bring him. Pt will wait for your call!!

## 2013-08-05 NOTE — Telephone Encounter (Signed)
Can you call pt to schedule a office visit?Dr. Sarajane Jews would like to discuss this with the pt.

## 2013-08-05 NOTE — Progress Notes (Signed)
Pre visit review using our clinic review tool, if applicable. No additional management support is needed unless otherwise documented below in the visit note. 

## 2013-08-05 NOTE — Telephone Encounter (Signed)
Pt is needing to speak with dr. Sarajane Jews regarding getting an order to get a scooter, pt wants to know if this is something dr. Sarajane Jews can write or if he has to come in for an appt.

## 2013-08-06 ENCOUNTER — Ambulatory Visit: Payer: Medicare Other | Admitting: Physical Therapy

## 2013-08-06 DIAGNOSIS — IMO0001 Reserved for inherently not codable concepts without codable children: Secondary | ICD-10-CM | POA: Diagnosis not present

## 2013-08-06 NOTE — Telephone Encounter (Signed)
It is a scooter.

## 2013-08-06 NOTE — Telephone Encounter (Signed)
done

## 2013-08-06 NOTE — Telephone Encounter (Signed)
i can write for this but I need to know if he got a power wheelchair or a scooter?

## 2013-08-10 ENCOUNTER — Ambulatory Visit: Payer: Medicare Other | Admitting: Physical Therapy

## 2013-08-10 DIAGNOSIS — IMO0001 Reserved for inherently not codable concepts without codable children: Secondary | ICD-10-CM | POA: Diagnosis not present

## 2013-08-11 NOTE — Telephone Encounter (Signed)
error 

## 2013-08-12 ENCOUNTER — Telehealth: Payer: Self-pay

## 2013-08-12 ENCOUNTER — Telehealth: Payer: Self-pay | Admitting: Family Medicine

## 2013-08-12 DIAGNOSIS — H9190 Unspecified hearing loss, unspecified ear: Secondary | ICD-10-CM

## 2013-08-12 NOTE — Telephone Encounter (Signed)
Pt needs a referral to dr. Lonzo Candy, ear doctor.  Fax to [phone].

## 2013-08-12 NOTE — Telephone Encounter (Signed)
Completed standard claim form completed signed by Dr.Smith and mailed with addressed envelope provided by pt

## 2013-08-13 ENCOUNTER — Ambulatory Visit: Payer: Medicare Other | Admitting: Physical Therapy

## 2013-08-13 DIAGNOSIS — IMO0001 Reserved for inherently not codable concepts without codable children: Secondary | ICD-10-CM | POA: Diagnosis not present

## 2013-08-13 NOTE — Telephone Encounter (Signed)
I left a voice message with the below information.

## 2013-08-13 NOTE — Telephone Encounter (Signed)
Referral was done  

## 2013-08-17 ENCOUNTER — Ambulatory Visit: Payer: Medicare Other | Admitting: Physical Therapy

## 2013-08-17 DIAGNOSIS — IMO0001 Reserved for inherently not codable concepts without codable children: Secondary | ICD-10-CM | POA: Diagnosis not present

## 2013-08-20 ENCOUNTER — Ambulatory Visit: Payer: Medicare Other | Admitting: Physical Therapy

## 2013-08-20 DIAGNOSIS — IMO0001 Reserved for inherently not codable concepts without codable children: Secondary | ICD-10-CM | POA: Diagnosis not present

## 2013-08-24 ENCOUNTER — Ambulatory Visit: Payer: Medicare Other | Admitting: Physical Therapy

## 2013-08-24 DIAGNOSIS — IMO0001 Reserved for inherently not codable concepts without codable children: Secondary | ICD-10-CM | POA: Diagnosis not present

## 2013-08-25 ENCOUNTER — Telehealth: Payer: Self-pay | Admitting: Family Medicine

## 2013-08-25 DIAGNOSIS — G8918 Other acute postprocedural pain: Secondary | ICD-10-CM

## 2013-08-25 DIAGNOSIS — G546 Phantom limb syndrome with pain: Secondary | ICD-10-CM

## 2013-08-25 NOTE — Telephone Encounter (Signed)
[facility] [place], [name] is requesting 90 day re-fill on finasteride (PROSCAR) 5 MG tablet and gabapentin (NEURONTIN) 600 MG tablet

## 2013-08-26 MED ORDER — GABAPENTIN 600 MG PO TABS
600.0000 mg | ORAL_TABLET | Freq: Three times a day (TID) | ORAL | Status: DC
Start: 2013-08-26 — End: 2013-08-27

## 2013-08-26 NOTE — Telephone Encounter (Signed)
I sent script for Neurontin e-scribe to [facility]. Pt was also requesting to change the Finasteride, due to this medication is no longer covered under his insurance plan. I spoke with pt and advised him to check with insurance company to find out what the alternative is for this medication and call us back.

## 2013-08-27 ENCOUNTER — Telehealth: Payer: Self-pay | Admitting: *Deleted

## 2013-08-27 ENCOUNTER — Telehealth: Payer: Self-pay | Admitting: Family Medicine

## 2013-08-27 ENCOUNTER — Ambulatory Visit: Payer: Medicare Other | Admitting: Physical Therapy

## 2013-08-27 DIAGNOSIS — IMO0001 Reserved for inherently not codable concepts without codable children: Secondary | ICD-10-CM | POA: Diagnosis not present

## 2013-08-27 DIAGNOSIS — G8918 Other acute postprocedural pain: Secondary | ICD-10-CM

## 2013-08-27 DIAGNOSIS — G546 Phantom limb syndrome with pain: Secondary | ICD-10-CM

## 2013-08-27 MED ORDER — GABAPENTIN 300 MG PO CAPS: 300.0000 mg | ORAL_CAPSULE | Freq: Two times a day (BID) | ORAL | Status: DC

## 2013-08-27 NOTE — Telephone Encounter (Signed)
Pt states thes are the 3 to choose from:  doxazosin, or mesylate. Or terazosin hzl

## 2013-08-27 NOTE — Telephone Encounter (Signed)
Also pt stated that he is now taking Gabapentin 300 mg take 1 po bid and that script was sent e-scribe to mail order.

## 2013-08-27 NOTE — Telephone Encounter (Signed)
I resent script and spoke with pt.

## 2013-08-27 NOTE — Telephone Encounter (Signed)
Patient called requesting pain medication percocet. This far out post op the patients pain management should be followed by PCP.  I explained that Dr Kellie Simmering was not in the office today to reevaluate him. Patient states that he has an appointment with Dr Naaman Plummer and will address it at that time.

## 2013-08-27 NOTE — Telephone Encounter (Signed)
Pt states his pharmacy called him in regards to rx gabapentin (NEURONTIN) 600 MG tablet, pt states he is taking 300 mg twice daily but the rx the pharmacy received states 600 mg three times daily. Pt requesting that the correct rx is sent to pharmacy

## 2013-08-30 ENCOUNTER — Telehealth: Payer: Self-pay | Admitting: Family Medicine

## 2013-08-30 NOTE — Telephone Encounter (Signed)
All of these "alternatives" to Proscar are HTN medications, and I am not comfortable with putting him on any of them. Besides none of these will do the same thing as Proscar. I suggest he either pay cash for generic Proscar or else stop it altogether. This is up to him.

## 2013-08-30 NOTE — Telephone Encounter (Signed)
[facility] [place], [place] is requesting 90 day re-fill on finasteride (PROSCAR) 5 MG tablet

## 2013-08-31 ENCOUNTER — Ambulatory Visit: Payer: Medicare Other | Admitting: Physical Therapy

## 2013-08-31 DIAGNOSIS — IMO0001 Reserved for inherently not codable concepts without codable children: Secondary | ICD-10-CM | POA: Diagnosis not present

## 2013-08-31 MED ORDER — FINASTERIDE 5 MG PO TABS: 5.0000 mg | ORAL_TABLET | Freq: Every day | ORAL | Status: DC

## 2013-08-31 NOTE — Telephone Encounter (Signed)
This is a duplicate note, see previous one.  

## 2013-08-31 NOTE — Telephone Encounter (Signed)
I called [employer] to check the status of the Tier Exception submitted on 08/26/13, it was APPROVED.  Finasteride was a Tier 2 medication and the patient is now able to get it a Tier 1 cost.  The representative didn't have the pricing available.   The APPROVAL is good from 08/26/13 to 08/28/14.

## 2013-08-31 NOTE — Telephone Encounter (Signed)
I sent script for Proscar e-scribe and spoke with pt.

## 2013-09-01 ENCOUNTER — Telehealth: Payer: Self-pay | Admitting: Cardiology

## 2013-09-01 ENCOUNTER — Ambulatory Visit (INDEPENDENT_AMBULATORY_CARE_PROVIDER_SITE_OTHER): Payer: Medicare Other | Admitting: *Deleted

## 2013-09-01 DIAGNOSIS — Z95810 Presence of automatic (implantable) cardiac defibrillator: Secondary | ICD-10-CM

## 2013-09-01 NOTE — Progress Notes (Signed)
Remote ICD transmission.   

## 2013-09-01 NOTE — Telephone Encounter (Signed)
Spoke with pt and reminded pt of remote transmission that is due today. Pt verbalized understanding.   

## 2013-09-02 ENCOUNTER — Ambulatory Visit (INDEPENDENT_AMBULATORY_CARE_PROVIDER_SITE_OTHER): Payer: Medicare Other | Admitting: General Practice

## 2013-09-02 ENCOUNTER — Ambulatory Visit: Payer: Medicare Other | Attending: Physical Medicine & Rehabilitation | Admitting: Physical Therapy

## 2013-09-02 DIAGNOSIS — Z4789 Encounter for other orthopedic aftercare: Secondary | ICD-10-CM | POA: Diagnosis not present

## 2013-09-02 DIAGNOSIS — I4891 Unspecified atrial fibrillation: Secondary | ICD-10-CM

## 2013-09-02 DIAGNOSIS — R269 Unspecified abnormalities of gait and mobility: Secondary | ICD-10-CM | POA: Insufficient documentation

## 2013-09-02 DIAGNOSIS — R5381 Other malaise: Secondary | ICD-10-CM | POA: Insufficient documentation

## 2013-09-02 DIAGNOSIS — IMO0001 Reserved for inherently not codable concepts without codable children: Secondary | ICD-10-CM | POA: Diagnosis present

## 2013-09-02 DIAGNOSIS — Z5181 Encounter for therapeutic drug level monitoring: Secondary | ICD-10-CM

## 2013-09-02 LAB — MDC_IDC_ENUM_SESS_TYPE_REMOTE
Brady Statistic AP VP Percent: 0 %
Brady Statistic AS VS Percent: 0.07 %
Brady Statistic RA Percent Paced: 0 %
HIGH POWER IMPEDANCE MEASURED VALUE: 399 Ohm
HIGH POWER IMPEDANCE MEASURED VALUE: 45 Ohm
HIGH POWER IMPEDANCE MEASURED VALUE: 58 Ohm
HighPow Impedance: 171 Ohm
Lead Channel Impedance Value: 437 Ohm
Lead Channel Impedance Value: 456 Ohm
Lead Channel Impedance Value: 513 Ohm
Lead Channel Pacing Threshold Amplitude: 0.625 V
Lead Channel Pacing Threshold Amplitude: 1.375 V
Lead Channel Pacing Threshold Pulse Width: 0.4 ms
Lead Channel Sensing Intrinsic Amplitude: 0.25 mV
Lead Channel Sensing Intrinsic Amplitude: 14.125 mV
Lead Channel Setting Pacing Amplitude: 2.5 V
Lead Channel Setting Pacing Pulse Width: 0.4 ms
Lead Channel Setting Pacing Pulse Width: 0.4 ms
Lead Channel Setting Sensing Sensitivity: 0.3 mV
MDC IDC MSMT BATTERY VOLTAGE: 2.63 V
MDC IDC MSMT LEADCHNL LV IMPEDANCE VALUE: 779 Ohm
MDC IDC MSMT LEADCHNL RA IMPEDANCE VALUE: 323 Ohm
MDC IDC MSMT LEADCHNL RA SENSING INTR AMPL: 0.25 mV
MDC IDC MSMT LEADCHNL RV PACING THRESHOLD PULSEWIDTH: 0.4 ms
MDC IDC MSMT LEADCHNL RV SENSING INTR AMPL: 14.125 mV
MDC IDC SESS DTM: 20150701164612
MDC IDC SET LEADCHNL RV PACING AMPLITUDE: 2.5 V
MDC IDC SET ZONE DETECTION INTERVAL: 290 ms
MDC IDC SET ZONE DETECTION INTERVAL: 350 ms
MDC IDC SET ZONE DETECTION INTERVAL: 450 ms
MDC IDC STAT BRADY AP VS PERCENT: 0 %
MDC IDC STAT BRADY AS VP PERCENT: 99.93 %
MDC IDC STAT BRADY RV PERCENT PACED: 99.93 %
Zone Setting Detection Interval: 320 ms

## 2013-09-02 LAB — POCT INR: INR: 1.4

## 2013-09-02 NOTE — Progress Notes (Signed)
Pre visit review using our clinic review tool, if applicable. No additional management support is needed unless otherwise documented below in the visit note. 

## 2013-09-07 ENCOUNTER — Ambulatory Visit: Payer: Medicare Other | Admitting: Physical Therapy

## 2013-09-07 DIAGNOSIS — IMO0001 Reserved for inherently not codable concepts without codable children: Secondary | ICD-10-CM | POA: Diagnosis not present

## 2013-09-08 ENCOUNTER — Encounter: Payer: Self-pay | Admitting: Cardiology

## 2013-09-08 ENCOUNTER — Telehealth: Payer: Self-pay | Admitting: Internal Medicine

## 2013-09-08 ENCOUNTER — Telehealth: Payer: Self-pay | Admitting: Cardiology

## 2013-09-08 NOTE — Telephone Encounter (Signed)
New message      Returning [name]'s call

## 2013-09-08 NOTE — Telephone Encounter (Signed)
LMOVM requesting pt send manual transmission for device from home b/c [facility] is doing upgrades on monitors.

## 2013-09-09 ENCOUNTER — Ambulatory Visit: Payer: Medicare Other | Admitting: Physical Therapy

## 2013-09-09 DIAGNOSIS — IMO0001 Reserved for inherently not codable concepts without codable children: Secondary | ICD-10-CM | POA: Diagnosis not present

## 2013-09-10 ENCOUNTER — Encounter: Payer: Self-pay | Admitting: Cardiology

## 2013-09-13 ENCOUNTER — Encounter: Payer: Self-pay | Admitting: Internal Medicine

## 2013-09-13 ENCOUNTER — Other Ambulatory Visit: Payer: Self-pay | Admitting: Endocrinology

## 2013-09-14 ENCOUNTER — Ambulatory Visit: Payer: Medicare Other | Admitting: Physical Therapy

## 2013-09-14 ENCOUNTER — Other Ambulatory Visit: Payer: Self-pay | Admitting: Endocrinology

## 2013-09-14 ENCOUNTER — Encounter: Payer: Self-pay | Admitting: Physical Medicine & Rehabilitation

## 2013-09-14 ENCOUNTER — Encounter: Payer: Medicare Other | Attending: Physical Medicine & Rehabilitation | Admitting: Physical Medicine & Rehabilitation

## 2013-09-14 VITALS — BP 150/74 | HR 71 | Resp 16 | Ht 68.0 in | Wt 179.0 lb

## 2013-09-14 DIAGNOSIS — X58XXXA Exposure to other specified factors, initial encounter: Secondary | ICD-10-CM | POA: Insufficient documentation

## 2013-09-14 DIAGNOSIS — G547 Phantom limb syndrome without pain: Secondary | ICD-10-CM | POA: Diagnosis present

## 2013-09-14 DIAGNOSIS — G8918 Other acute postprocedural pain: Secondary | ICD-10-CM | POA: Diagnosis present

## 2013-09-14 DIAGNOSIS — S78119A Complete traumatic amputation at level between unspecified hip and knee, initial encounter: Secondary | ICD-10-CM

## 2013-09-14 DIAGNOSIS — S78112A Complete traumatic amputation at level between left hip and knee, initial encounter: Secondary | ICD-10-CM

## 2013-09-14 DIAGNOSIS — G546 Phantom limb syndrome with pain: Secondary | ICD-10-CM

## 2013-09-14 DIAGNOSIS — IMO0001 Reserved for inherently not codable concepts without codable children: Secondary | ICD-10-CM | POA: Diagnosis not present

## 2013-09-14 MED ORDER — OXYCODONE-ACETAMINOPHEN 5-325 MG PO TABS: ORAL_TABLET | ORAL | Status: DC

## 2013-09-14 MED ORDER — GABAPENTIN 300 MG PO CAPS: 300.0000 mg | ORAL_CAPSULE | Freq: Three times a day (TID) | ORAL | Status: DC

## 2013-09-14 NOTE — Progress Notes (Signed)
Subjective:    Patient ID: [name], male    DOB: Jul 10, 1934, 79 y.o.   MRN: [id number]  HPI  [name] is back regarding his left AKA. He is in the middle of gait training at [facility] re[facility]. He has been going for about a month now. He is stable with the walker. He walked 300' today in therapy with his walker. He uses crutches also. He has ongoing adjustments by the prosthetist.   From a pain standpoint, he's had a lot of pain. The phantom limb pain is primarily the problem. It often affects his sleep and tolerance of day time activities. He massages the limb daily. He is also actively wearing the prosthesis daily.   He uses three percocets most days and he takes tramadol at night to assist with sleep/pain  Pain Inventory Average Pain 8 Pain Right Now 5 My pain is sharp and stabbing  In the last 24 hours, has pain interfered with the following? General activity 3 Relation with others 3 Enjoyment of life 4 What TIME of day is your pain at its worst? daytime Sleep (in general) Poor  Pain is worse with: some activites Pain improves with: medication Relief from Meds: 8  Mobility use a walker how many minutes can you walk? 30 ability to climb steps?  yes do you drive?  yes use a wheelchair Do you have any goals in this area?  yes  Function Do you have any goals in this area?  no  Neuro/Psych trouble walking  Prior Studies Any changes since last visit?  no  Physicians involved in your care Any changes since last visit?  no   Family History  Problem Relation Age of Onset  . Cancer      breast/fhx  . Heart disease      fhx  . Diabetes Neg Hx   . Cancer Father    History   Social History  . Marital Status: Widowed    Spouse Name: N/A    Number of Children: N/A  . Years of Education: N/A   Occupational History  . Retired    Social History Main Topics  . Smoking status: Never Smoker   . Smokeless tobacco: Never Used     Comment: 1-2 cigars  when golfing  . Alcohol Use: 2.4 oz/week    2 Glasses of wine, 2 Shots of liquor per week     Comment: 07/10/2012 "galss of wine or vodka tonic 3 X/wk"  . Drug Use: No  . Sexual Activity: Not Currently   Other Topics Concern  . None   Social History Narrative   Lives in [place] with significant other,  [name].   Past Surgical History  Procedure Laterality Date  . Turp vaporization    . Cardiac defibrillator placement  12/26/09    pacemaker combo  . Cervical epidural injection  2013  . Femoral-tibial bypass graft  09/25/2011    Procedure: BYPASS GRAFT FEMORAL-TIBIAL ARTERY;  Surgeon: Mal Misty, MD;  Location: [facility] OR;  Service: Vascular;  Laterality: Left;  Left Femoral - Anterior Tibial Bypass;  saphenous vein graft from left leg  . Intraoperative arteriogram  09/25/2011    Procedure: INTRA OPERATIVE ARTERIOGRAM;  Surgeon: Mal Misty, MD;  Location: [place];  Service: Vascular;  Laterality: Left;  . Femoral-tibial bypass graft  02/07/2012    Procedure: BYPASS GRAFT FEMORAL-TIBIAL ARTERY;  Surgeon: Rosetta Posner, MD;  Location: [place];  Service: Vascular;  Laterality: Left;  Thrombectomy Left Femoral - Tibial Bypass Graft  . Embolectomy  02/07/2012    Procedure: EMBOLECTOMY;  Surgeon: Rosetta Posner, MD;  Location: [place];  Service: Vascular;  Laterality: Left;  . Femoral-tibial bypass graft  04/03/2012    Procedure: BYPASS GRAFT FEMORAL-TIBIAL ARTERY;  Surgeon: Mal Misty, MD;  Location: [place];  Service: Vascular;  Laterality: Left;  Redo  . Insert / replace / remove pacemaker  2007  . Coronary angioplasty with stent placement  ~ 2000  . Coronary angioplasty    . Uvulopalatopharyngoplasty (uppp)/tonsillectomy/septoplasty  06/30/2003    Archie Endo 06/30/2003 (07/10/2012)  . Shoulder open rotator cuff repair Right 2001    repair of lacerated right/notes 10/11/1999  (07/10/2012)  . Foot surgery Right 03/20/2001    "have plates and screws in; didn't break it" (07/10/2012)  . Carpal tunnel release  Right 2002    Archie Endo 03/20/2001 (07/10/2012)  . Biceps tendon repair Right 2001    Archie Endo 03/20/2001 (07/10/2012)  . Renal artery stent  2009  . Femoral-popliteal bypass graft Left 09/16/2012    Procedure: LEFT FEMORAL-POPLITEAL BYPASS GRAFT WITH GORTEX Propaten Graft 6x80 Thin Wall and Left lower leg Angiogram;  Surgeon: Mal Misty, MD;  Location: [place];  Service: Vascular;  Laterality: Left;  . Colonoscopy      Hx; of  . Tonsillectomy    . Adenoidectomy      Hx; of   . Femoral-tibial bypass graft Left 09/30/2012    Procedure: REDO LEFT FEMORAL-ANTERIOR TIBIAL ARTERY BYPASS USING COMPOSITE CEPHALIC AND BASILIC VEIN GRAFT FROM LEFT ARM;  Surgeon: Mal Misty, MD;  Location: [place];  Service: Vascular;  Laterality: Left;  . I&d extremity Left 10/21/2012    Procedure: EXPLORATION AND DEBRIDEMENT OF LEFT GROIN WOUND;  Surgeon: Mal Misty, MD;  Location: [place];  Service: Vascular;  Laterality: Left;  . Patch angioplasty Left 10/21/2012    Procedure: PATCH ANGIOPLASTY;  Surgeon: Mal Misty, MD;  Location: [place];  Service: Vascular;  Laterality: Left;  . Groin debridement Left 11/12/2012    Procedure: CLOSURE INGUINAL WOUND;  Surgeon: Mal Misty, MD;  Location: [place];  Service: Vascular;  Laterality: Left;  . Embolectomy Left 12/16/2012    Procedure: THROMBECTOMY  LEFT LEG BYPASS;  Surgeon: Elam Dutch, MD;  Location: [place];  Service: Vascular;  Laterality: Left;  . Leg amputation above knee Left 02/04/2013    DR LAWSON  . Amputation Left 02/04/2013    Procedure: AMPUTATION ABOVE KNEE-LEFT;  Surgeon: Mal Misty, MD;  Location: [place];  Service: Vascular;  Laterality: Left;  . Removal of graft Left 04/16/2013    Procedure: I & D LEFT AKA WOUND, POSSIBLE REMOVAL OF INFECTED GORTEX GRAFT;  Surgeon: Mal Misty, MD;  Location: [facility];  Service: Vascular;  Laterality: Left;   Past Medical History  Diagnosis Date  . Gout   . Benign prostatic hypertrophy     takes Proscar daily  .  Atrial fibrillation     takes Warfarin daily  . Chronic systolic dysfunction of left ventricle     a. mixed ischemic and nonischemic CM,  EF 35%. b. s/p AICD implantation.  . ED (erectile dysfunction)   . Arthritis   . Pacemaker     medtronic  . CAD (coronary artery disease)     a. s/p mid LAD stenting with DES 2008 Dr. Daneen Schick  . ICD (implantable cardiac defibrillator) in place     medtronic,  Dr. Rayann Heman  . ICD (implantable cardiac defibrillator) in place     due for check in Feb/2013  . Sleep apnea     hx of "had surgery for"  . Automatic implantable cardioverter-defibrillator in situ   . Depression   . Numbness and tingling     Hx; 45f left foot  . PAD (peripheral artery disease)     Severe by PV angiogram 09/2011  . Renal artery stenosis     s/p stenting 2009  . PAD (peripheral artery disease)     s/p multiple LLE bypass grafts; left mid-distal SCA occlusion by 08/2012 duplex  . Hypertension     takes Carvedilol and Losartan daily  . Constipation     takes Miralax daily as needed and Colace daily   . Type II diabetes mellitus     takes Novolog 70/30  . CHF (congestive heart failure)     takes Lasix daily  . History of MRSA infection   . Insomnia     TAKES TRAZODONE NIGHTLY   BP 150/74  Pulse 71  Resp 16  Ht 5\' 8"  (1.727 m)  Wt 179 lb (81.194 kg)  BMI 27.22 kg/m2  SpO2 99%  Opioid Risk Score:   Fall Risk Score:     Review of Systems  Musculoskeletal: Positive for gait problem.  All other systems reviewed and are negative.      Objective:   Physical Exam  Constitutional: He is oriented to person, place, and time. He appears well-developed and well-nourished.  HENT:  Head: Normocephalic and atraumatic.  Eyes: Conjunctivae are normal. Pupils are equal, round, and reactive to light.  Neck: Neck supple.  Cardiovascular: Normal rate and regular rhythm. No rubs, gallops  No murmur heard.  Respiratory: Effort normal and breath sounds normal. No respiratory  distress. He has no wheezes.  GI: Soft. Bowel sounds are normal. He exhibits no distension. There is no tenderness.  Musculoskeletal:  L-AKA prosthesis in place. Appears to be fitting appropriately.  Neurological: He is alert and oriented to person, place, and time.  Follows commands. Strength grossly intact in UE's at 5/5. RLE is 4/5 prox to 5/5 distally. Left HF 4/5. mild sensory loss in distal right leg/foot.  Skin: Intact.   Psychiatric: He has a normal mood and affect. His speech is normal and behavior is normal. Judgment and thought content normal. Cognition and memory are normal.    Assessment/Plan:  1. Functional deficits secondary to left AKA. Continue gait training  2. Pain Management: for phantom limb pain---increase ultimately to 600mg  TID. Detailed instructions were provided to demonstrate titration. Also asked him to observe closely for SE.   -Do not wish to add TCA's or SSNRI given CV history  -prn percocet and tramadol to continue----he uses tramadol mainly at night  -2 percocet rxes were provided today -discussed again aggressive massage and visual feed back therapy and prosthetic wear  3. Wound:  -now healed.   4. Follow up with me in about 2 months . 15 minutes of face to face patient care time were spent during this visit. All questions were encouraged and answered.

## 2013-09-14 NOTE — Patient Instructions (Signed)
GABAPENTIN:  WEEK 1: INCREASE TO ONE CAPSULE THREE X DAILY WEEK 2: TAKE 300MG -300MG -600MG  WEEK 3: TAKE 600MG -30OMG-600MG  WEEK 4: TAKE 600MG  THREE X DAILY   RE-EVALUATE AFTER EVERY WEEK OF INCREASED DOSAGE. IF IT'S CONTROLLING YOUR PAIN, THEN STOP AT THIS DOSE. ALSO OBSERVE TO SEE IF YOU ARE HAVING PROBLEMS WITH SEDATION, CONFUSION, NAUSEA, OR OTHER INTOLERANCE

## 2013-09-16 ENCOUNTER — Other Ambulatory Visit: Payer: Self-pay | Admitting: Endocrinology

## 2013-09-17 ENCOUNTER — Ambulatory Visit: Payer: Medicare Other | Admitting: Physical Therapy

## 2013-09-17 DIAGNOSIS — IMO0001 Reserved for inherently not codable concepts without codable children: Secondary | ICD-10-CM | POA: Diagnosis not present

## 2013-09-18 ENCOUNTER — Other Ambulatory Visit: Payer: Self-pay | Admitting: Endocrinology

## 2013-09-20 ENCOUNTER — Other Ambulatory Visit: Payer: Self-pay

## 2013-09-20 ENCOUNTER — Telehealth: Payer: Self-pay | Admitting: Family Medicine

## 2013-09-20 MED ORDER — GLUCOSE BLOOD VI STRP: ORAL_STRIP | Status: DC

## 2013-09-20 NOTE — Telephone Encounter (Signed)
I sent script e-scribe. 

## 2013-09-20 NOTE — Telephone Encounter (Signed)
Pt called to say he need a new rx for 1 touch ultra blue test strips   this med was rx by Renato Shin

## 2013-09-21 ENCOUNTER — Ambulatory Visit: Payer: Medicare Other | Admitting: Physical Therapy

## 2013-09-21 DIAGNOSIS — IMO0001 Reserved for inherently not codable concepts without codable children: Secondary | ICD-10-CM | POA: Diagnosis not present

## 2013-09-24 ENCOUNTER — Ambulatory Visit: Payer: Medicare Other | Admitting: Physical Therapy

## 2013-09-24 DIAGNOSIS — IMO0001 Reserved for inherently not codable concepts without codable children: Secondary | ICD-10-CM | POA: Diagnosis not present

## 2013-09-25 IMAGING — CR DG CHEST 2V
2 series · 2 of 2 positions shown · non-contrast
Comparison: 12/26/2009

CLINICAL DATA: Cigar smoker, elevated white count.  Evaluate for
infection

CHEST - 2 VIEW

[view not recorded (1 of 2)]
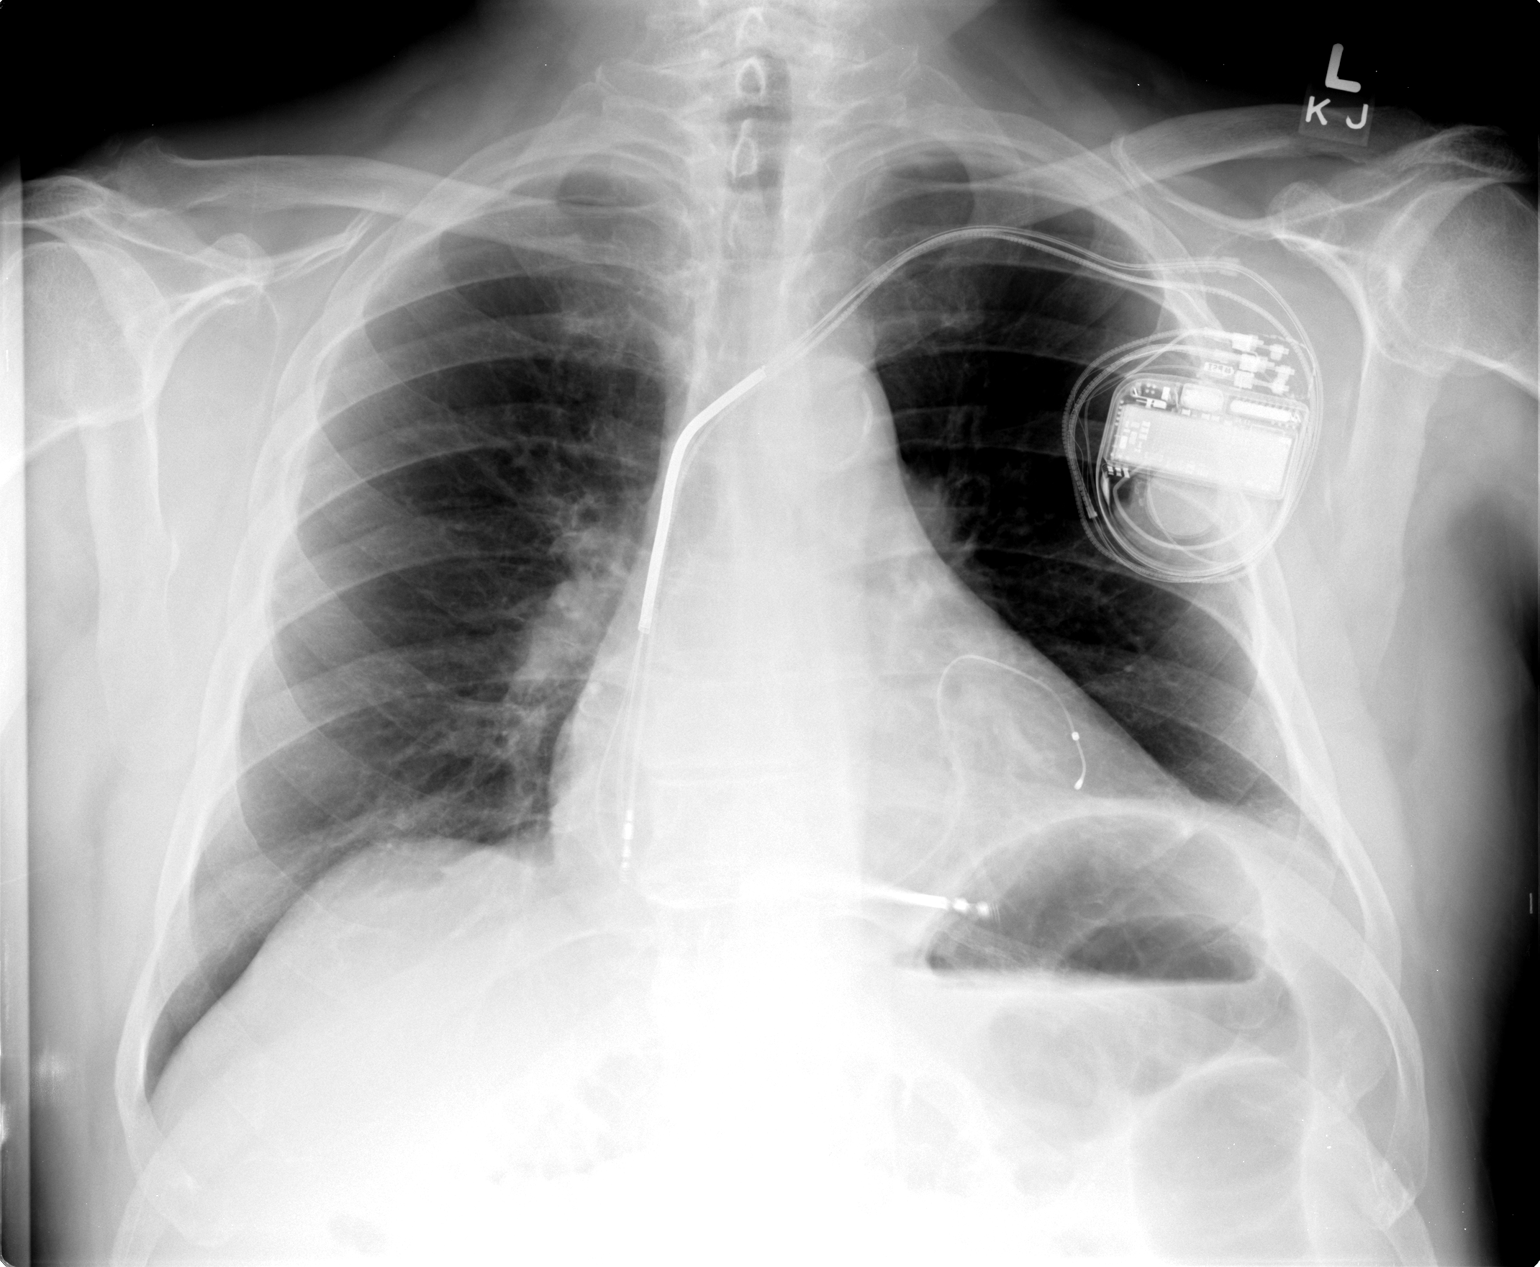

[view not recorded (2 of 2)]
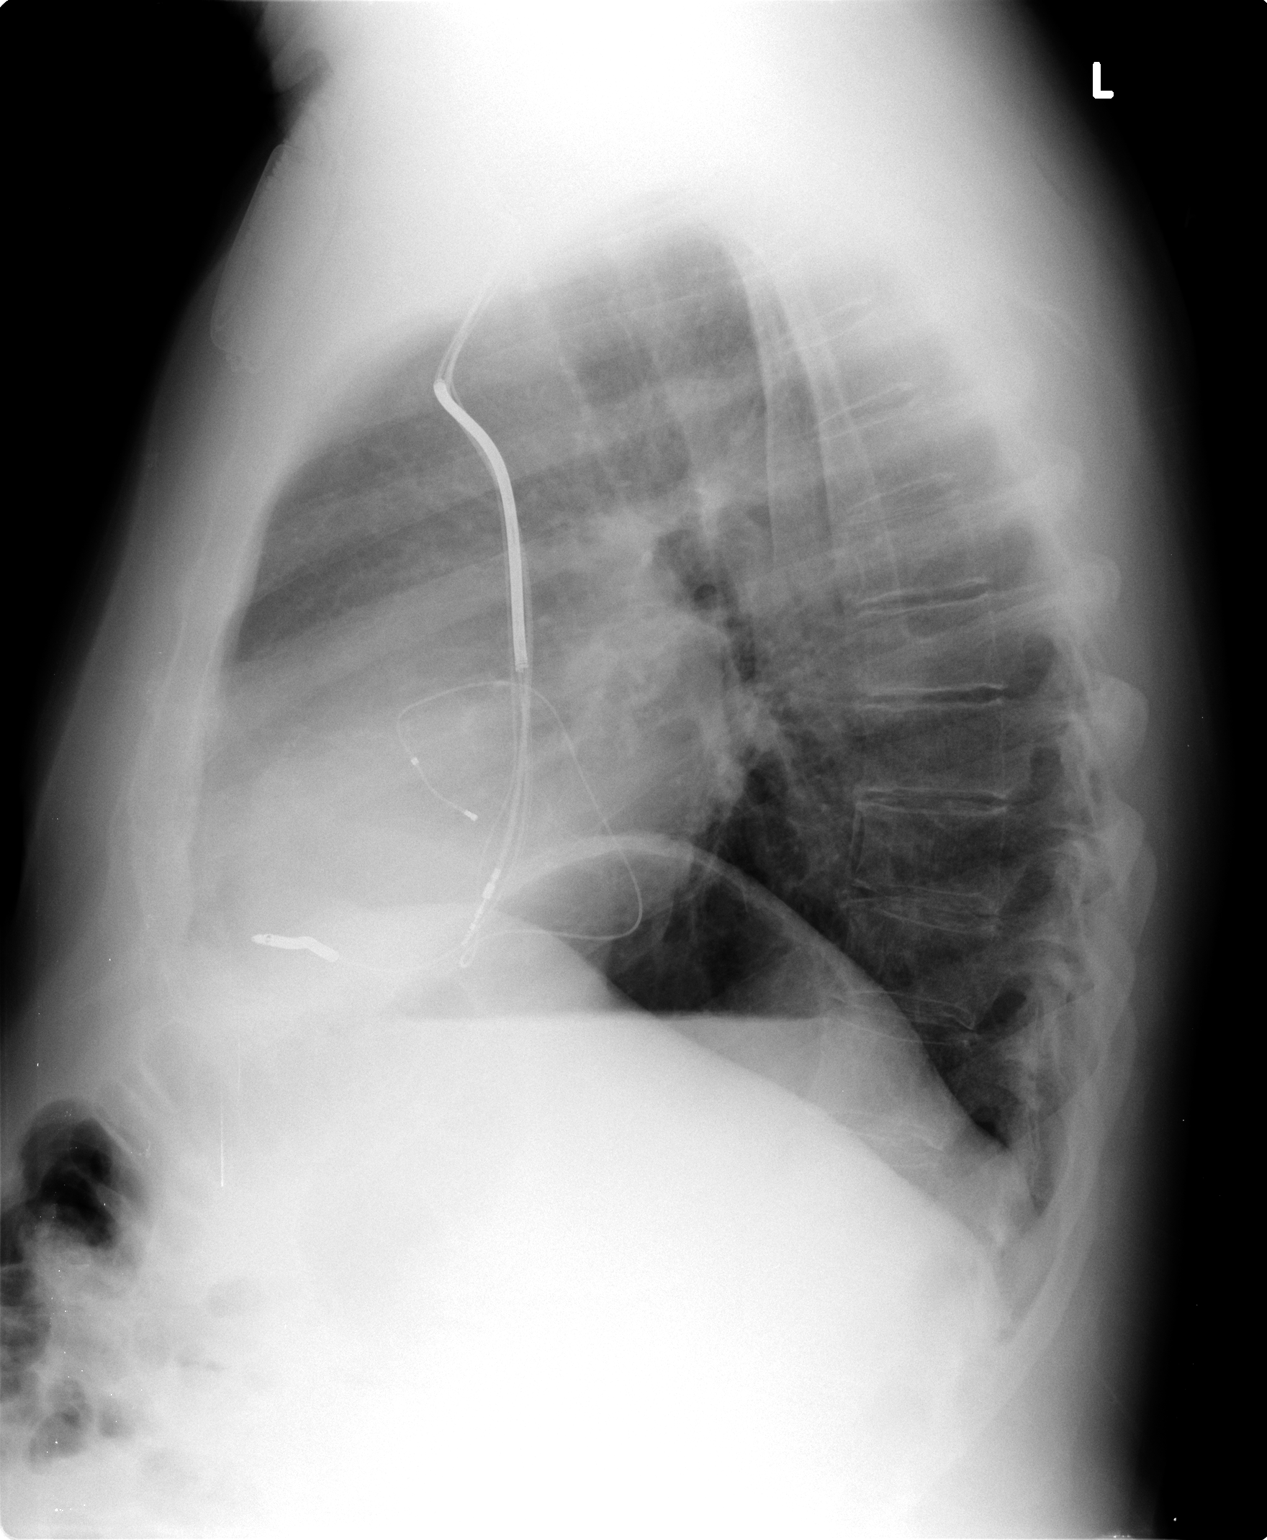

[2 of 2 positions shown; findings below may reference images not displayed]

FINDINGS: There is a left chest wall AICD with lead in the right
atrial appendage, coronary sinus and right ventricle.  Heart size
appears normal.  No pleural effusion or edema.  No airspace
consolidation noted.  Review of the visualized osseous structures
is unremarkable.
IMPRESSION: 1.  No acute cardiopulmonary abnormalities.

## 2013-09-27 ENCOUNTER — Ambulatory Visit: Payer: Medicare Other | Admitting: Family

## 2013-09-27 ENCOUNTER — Encounter (HOSPITAL_COMMUNITY): Payer: Medicare Other

## 2013-09-27 ENCOUNTER — Telehealth: Payer: Self-pay | Admitting: Family Medicine

## 2013-09-27 NOTE — Telephone Encounter (Signed)
Okay to schedule

## 2013-09-27 NOTE — Telephone Encounter (Signed)
Pt would like appt this wk for fup on meds and pt had leg amputated.Can I use sda slot?

## 2013-09-27 NOTE — Telephone Encounter (Signed)
Pt has been sch

## 2013-09-28 ENCOUNTER — Ambulatory Visit: Payer: Medicare Other | Admitting: Physical Therapy

## 2013-09-28 DIAGNOSIS — IMO0001 Reserved for inherently not codable concepts without codable children: Secondary | ICD-10-CM | POA: Diagnosis not present

## 2013-09-30 ENCOUNTER — Ambulatory Visit (INDEPENDENT_AMBULATORY_CARE_PROVIDER_SITE_OTHER): Payer: Medicare Other | Admitting: General Practice

## 2013-09-30 ENCOUNTER — Encounter: Payer: Self-pay | Admitting: Family Medicine

## 2013-09-30 ENCOUNTER — Ambulatory Visit (INDEPENDENT_AMBULATORY_CARE_PROVIDER_SITE_OTHER): Payer: Medicare Other | Admitting: Family Medicine

## 2013-09-30 ENCOUNTER — Telehealth: Payer: Self-pay | Admitting: Internal Medicine

## 2013-09-30 VITALS — BP 144/76 | HR 72 | Temp 99.1°F

## 2013-09-30 DIAGNOSIS — G8918 Other acute postprocedural pain: Secondary | ICD-10-CM

## 2013-09-30 DIAGNOSIS — G609 Hereditary and idiopathic neuropathy, unspecified: Secondary | ICD-10-CM

## 2013-09-30 DIAGNOSIS — G546 Phantom limb syndrome with pain: Secondary | ICD-10-CM

## 2013-09-30 DIAGNOSIS — I739 Peripheral vascular disease, unspecified: Secondary | ICD-10-CM

## 2013-09-30 DIAGNOSIS — S78112A Complete traumatic amputation at level between left hip and knee, initial encounter: Secondary | ICD-10-CM

## 2013-09-30 DIAGNOSIS — E785 Hyperlipidemia, unspecified: Secondary | ICD-10-CM

## 2013-09-30 DIAGNOSIS — S78119A Complete traumatic amputation at level between unspecified hip and knee, initial encounter: Secondary | ICD-10-CM

## 2013-09-30 DIAGNOSIS — I209 Angina pectoris, unspecified: Secondary | ICD-10-CM

## 2013-09-30 DIAGNOSIS — G547 Phantom limb syndrome without pain: Secondary | ICD-10-CM

## 2013-09-30 DIAGNOSIS — I2510 Atherosclerotic heart disease of native coronary artery without angina pectoris: Secondary | ICD-10-CM

## 2013-09-30 DIAGNOSIS — I25119 Atherosclerotic heart disease of native coronary artery with unspecified angina pectoris: Secondary | ICD-10-CM

## 2013-09-30 DIAGNOSIS — I4891 Unspecified atrial fibrillation: Secondary | ICD-10-CM

## 2013-09-30 DIAGNOSIS — E1059 Type 1 diabetes mellitus with other circulatory complications: Secondary | ICD-10-CM

## 2013-09-30 DIAGNOSIS — N259 Disorder resulting from impaired renal tubular function, unspecified: Secondary | ICD-10-CM

## 2013-09-30 DIAGNOSIS — I10 Essential (primary) hypertension: Secondary | ICD-10-CM

## 2013-09-30 DIAGNOSIS — Z5181 Encounter for therapeutic drug level monitoring: Secondary | ICD-10-CM

## 2013-09-30 LAB — POCT INR: INR: 1.9

## 2013-09-30 MED ORDER — OXYCODONE-ACETAMINOPHEN 5-325 MG PO TABS: ORAL_TABLET | ORAL | Status: DC

## 2013-09-30 NOTE — Telephone Encounter (Signed)
returned pt call. pt sts that he was seen recently by his pcp Dr.Fry who suggested pt switch anticoag meds. pcp adv pt to contact his cardiologist in ref to switching anticoag therapy from coumadin to xarelto.adv pt I will fwd Dr.Smtih, and call back with his response. pt agreeable and verbalized understanding.

## 2013-09-30 NOTE — Telephone Encounter (Signed)
°  Patient wants to stop using coumadin, he went and seen his PCP this morning ( Dr. Sharlene Motts). He ask patient to call and ask Dr Rayann Heman if it was okay to discontinue coumadin and start Xeralto. Please call patient and advise.

## 2013-09-30 NOTE — Telephone Encounter (Signed)
If Dr. Sharlene Motts wants to change him, I would recommend Dr. Sharlene Motts also make sure to keep an eye on his renal function in case a dose adjustment is needed.  I'd also make sure patient knows there isn't a generic for his.  To Dr. Tamala Julian as they want his opinion.

## 2013-09-30 NOTE — Progress Notes (Signed)
Pre visit review using our clinic review tool, if applicable. No additional management support is needed unless otherwise documented below in the visit note. 

## 2013-09-30 NOTE — Progress Notes (Signed)
   Subjective:    Patient ID: [name], male    DOB: 01/01/1935, 79 y.o.   MRN: [id number]  HPI Here to follow up. He sees numerous medical clinics now including Dr. Naaman Plummer for rehab, Dr. Rayann Heman for Cardiology, and the [facility] system. He had a left AKA in February and he has done quite well since then. He wears a computerized prosthetic leg and he is slowly learning how to walk again, though he uses a wheelchair the majority of the time. He can drive his car. His glucoses have been well controlled. The last A1c we obtained last January was 6.0, and he had recent labs through the [place] clinic, with this being below 6.0. His BP is stable. One of his main concerns now is phantom pains from the left leg. These make sleep difficult but Percocet helps.    Review of Systems  Constitutional: Negative.   Respiratory: Negative.   Cardiovascular: Negative.   Neurological: Negative.        Objective:   Physical Exam  Constitutional: He is oriented to person, place, and time. He appears well-developed and well-nourished.  Cardiovascular: Normal rate, normal heart sounds and intact distal pulses.   Irregular rhythm   Pulmonary/Chest: Effort normal and breath sounds normal.  Neurological: He is alert and oriented to person, place, and time.          Assessment & Plan:  He seems to be doing quite well from a general medical perspective. He can use Percocet for pain control. I asked him to check with Dr. Rayann Heman as to whether he could switch from Coumadin to an agent like Xarelto or not.

## 2013-09-30 NOTE — Telephone Encounter (Signed)
Dr Tamala Julian is his primary cardiologist.  Will forward to [place] to review with him

## 2013-10-01 ENCOUNTER — Ambulatory Visit: Payer: Medicare Other | Admitting: Physical Therapy

## 2013-10-01 DIAGNOSIS — IMO0001 Reserved for inherently not codable concepts without codable children: Secondary | ICD-10-CM | POA: Diagnosis not present

## 2013-10-01 NOTE — Telephone Encounter (Signed)
Given renal function, Dr. Tamala Julian would only want Eliquis 5 mg bid to be used (weight 81 kg, Scr 1.6, age 79 y.o.).  If Dr. Sarajane Jews wants to change him, this is the one Dr. Tamala Julian would want to be used.  Would recommend having patient call his insurance company to check on the cost before changing.    To Dr. Sarajane Jews

## 2013-10-01 NOTE — Telephone Encounter (Signed)
He has renal impairment and therefore Xarelto would not be the best choice. I would prefer Eliquis 5 mg BID if they are going to change.Patient may not be able to afford.

## 2013-10-01 NOTE — Telephone Encounter (Signed)
I spoke with pt and he will check with insurance company about the price and he will call us back then.

## 2013-10-01 NOTE — Telephone Encounter (Signed)
I understand the advice from Dr. Tamala Julian and I agree. Have [facility] check with his insurance company as to whether Eliquis would be afforable to him. If not he can stay on Coumadin

## 2013-10-04 ENCOUNTER — Telehealth: Payer: Self-pay | Admitting: Internal Medicine

## 2013-10-04 ENCOUNTER — Encounter: Payer: Medicare Other | Admitting: *Deleted

## 2013-10-04 NOTE — Telephone Encounter (Signed)
New Prob    Pt is having some issues transmitting. States "it is not making a complete connection". Please call.

## 2013-10-04 NOTE — Telephone Encounter (Signed)
New message     Pt want you to know that his remote box is "old" and the company is sending him a new up-to-date box. As soon as he gets his box, he will transmit.  It will take 7-10 days to receive the box.

## 2013-10-05 ENCOUNTER — Telehealth: Payer: Self-pay | Admitting: Cardiology

## 2013-10-05 ENCOUNTER — Encounter: Payer: Self-pay | Admitting: Internal Medicine

## 2013-10-05 NOTE — Telephone Encounter (Signed)
LMOVM reminding pt to send remote transmission.   

## 2013-10-07 ENCOUNTER — Encounter: Payer: Self-pay | Admitting: Cardiology

## 2013-10-12 ENCOUNTER — Ambulatory Visit: Payer: Medicare Other | Attending: Physical Medicine & Rehabilitation | Admitting: Physical Therapy

## 2013-10-12 ENCOUNTER — Encounter: Payer: Medicare Other | Admitting: Physical Therapy

## 2013-10-12 DIAGNOSIS — IMO0001 Reserved for inherently not codable concepts without codable children: Secondary | ICD-10-CM | POA: Insufficient documentation

## 2013-10-12 DIAGNOSIS — R269 Unspecified abnormalities of gait and mobility: Secondary | ICD-10-CM | POA: Diagnosis not present

## 2013-10-12 DIAGNOSIS — R5381 Other malaise: Secondary | ICD-10-CM | POA: Diagnosis not present

## 2013-10-12 DIAGNOSIS — Z4789 Encounter for other orthopedic aftercare: Secondary | ICD-10-CM | POA: Insufficient documentation

## 2013-10-13 ENCOUNTER — Ambulatory Visit (INDEPENDENT_AMBULATORY_CARE_PROVIDER_SITE_OTHER): Payer: Medicare Other | Admitting: *Deleted

## 2013-10-13 DIAGNOSIS — Z95810 Presence of automatic (implantable) cardiac defibrillator: Secondary | ICD-10-CM

## 2013-10-13 NOTE — Progress Notes (Signed)
Remote ICD transmission.   

## 2013-10-14 ENCOUNTER — Ambulatory Visit: Payer: Medicare Other | Admitting: Physical Therapy

## 2013-10-14 DIAGNOSIS — IMO0001 Reserved for inherently not codable concepts without codable children: Secondary | ICD-10-CM | POA: Diagnosis not present

## 2013-10-15 ENCOUNTER — Telehealth: Payer: Self-pay | Admitting: Family Medicine

## 2013-10-15 ENCOUNTER — Telehealth: Payer: Self-pay | Admitting: Cardiology

## 2013-10-15 NOTE — Telephone Encounter (Signed)
Pt would like md to contact [facility] to get [facility] set [facility] for program call [facility] (per pt the hish will assist him with reconstruction on bedroom etc) pt does not have phone #.

## 2013-10-15 NOTE — Telephone Encounter (Signed)
Pt called stated that his pacemaker was making alert sound. I informed pt that this indicated that he has reached ERI and that  I would have the scheduler call him to schedule an appt with MD to talk about the procedure. Pt verbalized understanding.

## 2013-10-18 NOTE — Telephone Encounter (Signed)
I cannot do the research on this. It is up to the patient to contact this program and if there are forms to fill out, then I would be happy to do so

## 2013-10-19 ENCOUNTER — Ambulatory Visit: Payer: Medicare Other | Admitting: Physical Therapy

## 2013-10-19 DIAGNOSIS — IMO0001 Reserved for inherently not codable concepts without codable children: Secondary | ICD-10-CM | POA: Diagnosis not present

## 2013-10-19 NOTE — Telephone Encounter (Signed)
I left a voice message with the below information.

## 2013-10-20 ENCOUNTER — Encounter: Payer: Self-pay | Admitting: Internal Medicine

## 2013-10-20 ENCOUNTER — Encounter: Payer: Self-pay | Admitting: *Deleted

## 2013-10-20 ENCOUNTER — Ambulatory Visit (INDEPENDENT_AMBULATORY_CARE_PROVIDER_SITE_OTHER): Payer: Medicare Other | Admitting: Internal Medicine

## 2013-10-20 VITALS — BP 130/90 | HR 73 | Ht 68.0 in | Wt 169.0 lb

## 2013-10-20 DIAGNOSIS — I5022 Chronic systolic (congestive) heart failure: Secondary | ICD-10-CM

## 2013-10-20 DIAGNOSIS — I739 Peripheral vascular disease, unspecified: Secondary | ICD-10-CM

## 2013-10-20 DIAGNOSIS — I509 Heart failure, unspecified: Secondary | ICD-10-CM

## 2013-10-20 DIAGNOSIS — I4891 Unspecified atrial fibrillation: Secondary | ICD-10-CM

## 2013-10-20 LAB — MDC_IDC_ENUM_SESS_TYPE_INCLINIC
Brady Statistic AP VS Percent: 0 %
Brady Statistic AS VP Percent: 99.87 %
Brady Statistic AS VS Percent: 0.13 %
Brady Statistic RA Percent Paced: 0 %
Brady Statistic RV Percent Paced: 99.87 %
Date Time Interrogation Session: 20150819171341
HIGH POWER IMPEDANCE MEASURED VALUE: 57 Ohm
HighPow Impedance: 190 Ohm
HighPow Impedance: 437 Ohm
HighPow Impedance: 45 Ohm
Lead Channel Impedance Value: 437 Ohm
Lead Channel Impedance Value: 494 Ohm
Lead Channel Impedance Value: 570 Ohm
Lead Channel Impedance Value: 836 Ohm
Lead Channel Pacing Threshold Pulse Width: 0.4 ms
Lead Channel Sensing Intrinsic Amplitude: 0.25 mV
Lead Channel Sensing Intrinsic Amplitude: 0.25 mV
Lead Channel Sensing Intrinsic Amplitude: 14.125 mV
Lead Channel Sensing Intrinsic Amplitude: 14.125 mV
Lead Channel Setting Pacing Amplitude: 2.5 V
Lead Channel Setting Pacing Pulse Width: 0.4 ms
Lead Channel Setting Sensing Sensitivity: 0.3 mV
MDC IDC MSMT BATTERY VOLTAGE: 2.62 V
MDC IDC MSMT LEADCHNL LV PACING THRESHOLD AMPLITUDE: 1.375 V
MDC IDC MSMT LEADCHNL RA IMPEDANCE VALUE: 342 Ohm
MDC IDC MSMT LEADCHNL RV PACING THRESHOLD AMPLITUDE: 0.75 V
MDC IDC MSMT LEADCHNL RV PACING THRESHOLD PULSEWIDTH: 0.4 ms
MDC IDC SET LEADCHNL LV PACING PULSEWIDTH: 0.4 ms
MDC IDC SET LEADCHNL RV PACING AMPLITUDE: 2.5 V
MDC IDC SET ZONE DETECTION INTERVAL: 350 ms
MDC IDC SET ZONE DETECTION INTERVAL: 450 ms
MDC IDC STAT BRADY AP VP PERCENT: 0 %
Zone Setting Detection Interval: 290 ms
Zone Setting Detection Interval: 320 ms

## 2013-10-20 NOTE — Patient Instructions (Signed)

## 2013-10-21 ENCOUNTER — Encounter: Payer: Self-pay | Admitting: Internal Medicine

## 2013-10-21 NOTE — Progress Notes (Signed)
PCP: Laurey Morale, MD Primary Cardiologist:  Dr Daneen Schick  [name] is a 79 y.o. male with a h/o mixed ischemic/ nonischemic CM, LBBB, NYHA Class III CHF sp BiV ICD (MDT) by Dr Leonia Reeves who presents today to for follow-up in the Electrophysiology device clinic. He is doing very well from a cardiac stand point but continues to struggle with vascular disease. He is sp L AKA several months ago.  He continues to adjust to his prosthesis.  He is not very active.  His BiV ICD has reached ERI battery status and he presents for further evaluation.  Today, he  denies symptoms of palpitations, chest pain, orthopnea, PND, dizziness, presyncope, syncope, or other neurologic sequela.   Past Medical History  Diagnosis Date  . Gout   . Benign prostatic hypertrophy     takes Proscar daily  . Atrial fibrillation     takes Warfarin daily  . Chronic systolic dysfunction of left ventricle     a. mixed ischemic and nonischemic CM,  EF 35%. b. s/p AICD implantation.  . ED (erectile dysfunction)   . Arthritis   . Pacemaker     medtronic  . CAD (coronary artery disease)     a. s/p mid LAD stenting with DES 2008 Dr. Daneen Schick  . ICD (implantable cardiac defibrillator) in place     medtronic, Dr. Rayann Heman  . ICD (implantable cardiac defibrillator) in place     due for check in Feb/2013  . Sleep apnea     hx of "had surgery for"  . Automatic implantable cardioverter-defibrillator in situ   . Depression   . Numbness and tingling     Hx; 45f left foot  . PAD (peripheral artery disease)     Severe by PV angiogram 09/2011  . Renal artery stenosis     s/p stenting 2009  . PAD (peripheral artery disease)     s/p multiple LLE bypass grafts; left mid-distal SCA occlusion by 08/2012 duplex  . Hypertension     takes Carvedilol and Losartan daily  . Constipation     takes Miralax daily as needed and Colace daily   . Type II diabetes mellitus     takes Novolog 70/30  . CHF (congestive heart failure)    takes Lasix daily  . History of MRSA infection   . Insomnia     TAKES TRAZODONE NIGHTLY    Past Surgical History  Procedure Laterality Date  . Turp vaporization    . Cardiac defibrillator placement  12/26/09    pacemaker combo  . Cervical epidural injection  2013  . Femoral-tibial bypass graft  09/25/2011    Procedure: BYPASS GRAFT FEMORAL-TIBIAL ARTERY;  Surgeon: Mal Misty, MD;  Location: [facility] OR;  Service: Vascular;  Laterality: Left;  Left Femoral - Anterior Tibial Bypass;  saphenous vein graft from left leg  . Intraoperative arteriogram  09/25/2011    Procedure: INTRA OPERATIVE ARTERIOGRAM;  Surgeon: Mal Misty, MD;  Location: [place];  Service: Vascular;  Laterality: Left;  . Femoral-tibial bypass graft  02/07/2012    Procedure: BYPASS GRAFT FEMORAL-TIBIAL ARTERY;  Surgeon: Rosetta Posner, MD;  Location: [place];  Service: Vascular;  Laterality: Left;  Thrombectomy Left Femoral - Tibial Bypass Graft  . Embolectomy  02/07/2012    Procedure: EMBOLECTOMY;  Surgeon: Rosetta Posner, MD;  Location: [place];  Service: Vascular;  Laterality: Left;  . Femoral-tibial bypass graft  04/03/2012    Procedure: BYPASS GRAFT FEMORAL-TIBIAL ARTERY;  Surgeon: Mal Misty, MD;  Location: [place];  Service: Vascular;  Laterality: Left;  Redo  . Insert / replace / remove pacemaker  2007  . Coronary angioplasty with stent placement  ~ 2000  . Coronary angioplasty    . Uvulopalatopharyngoplasty (uppp)/tonsillectomy/septoplasty  06/30/2003    Archie Endo 06/30/2003 (07/10/2012)  . Shoulder open rotator cuff repair Right 2001    repair of lacerated right/notes 10/11/1999  (07/10/2012)  . Foot surgery Right 03/20/2001    "have plates and screws in; didn't break it" (07/10/2012)  . Carpal tunnel release Right 2002    Archie Endo 03/20/2001 (07/10/2012)  . Biceps tendon repair Right 2001    Archie Endo 03/20/2001 (07/10/2012)  . Renal artery stent  2009  . Femoral-popliteal bypass graft Left 09/16/2012    Procedure: LEFT FEMORAL-POPLITEAL  BYPASS GRAFT WITH GORTEX Propaten Graft 6x80 Thin Wall and Left lower leg Angiogram;  Surgeon: Mal Misty, MD;  Location: [place];  Service: Vascular;  Laterality: Left;  . Colonoscopy      Hx; of  . Tonsillectomy    . Adenoidectomy      Hx; of   . Femoral-tibial bypass graft Left 09/30/2012    Procedure: REDO LEFT FEMORAL-ANTERIOR TIBIAL ARTERY BYPASS USING COMPOSITE CEPHALIC AND BASILIC VEIN GRAFT FROM LEFT ARM;  Surgeon: Mal Misty, MD;  Location: [place];  Service: Vascular;  Laterality: Left;  . I&d extremity Left 10/21/2012    Procedure: EXPLORATION AND DEBRIDEMENT OF LEFT GROIN WOUND;  Surgeon: Mal Misty, MD;  Location: [place];  Service: Vascular;  Laterality: Left;  . Patch angioplasty Left 10/21/2012    Procedure: PATCH ANGIOPLASTY;  Surgeon: Mal Misty, MD;  Location: [place];  Service: Vascular;  Laterality: Left;  . Groin debridement Left 11/12/2012    Procedure: CLOSURE INGUINAL WOUND;  Surgeon: Mal Misty, MD;  Location: [place];  Service: Vascular;  Laterality: Left;  . Embolectomy Left 12/16/2012    Procedure: THROMBECTOMY  LEFT LEG BYPASS;  Surgeon: Elam Dutch, MD;  Location: [place];  Service: Vascular;  Laterality: Left;  . Leg amputation above knee Left 02/04/2013    DR LAWSON  . Amputation Left 02/04/2013    Procedure: AMPUTATION ABOVE KNEE-LEFT;  Surgeon: Mal Misty, MD;  Location: [place];  Service: Vascular;  Laterality: Left;  . Removal of graft Left 04/16/2013    Procedure: I & D LEFT AKA WOUND, POSSIBLE REMOVAL OF INFECTED GORTEX GRAFT;  Surgeon: Mal Misty, MD;  Location: [place];  Service: Vascular;  Laterality: Left;    History   Social History  . Marital Status: Widowed    Spouse Name: N/A    Number of Children: N/A  . Years of Education: N/A   Occupational History  . Retired    Social History Main Topics  . Smoking status: Never Smoker   . Smokeless tobacco: Never Used     Comment: 1-2 cigars when golfing  . Alcohol Use: 2.4 oz/week     2 Glasses of wine, 2 Shots of liquor per week     Comment: 07/10/2012 "glass of wine or vodka tonic 3 X/wk"  . Drug Use: No  . Sexual Activity: Not Currently   Other Topics Concern  . Not on file   Social History Narrative   Lives in [place] with significant other,  [name].    Family History  Problem Relation Age of Onset  . Cancer      breast/fhx  . Heart disease  fhx  . Diabetes Neg Hx   . Cancer Father     Allergies  Allergen Reactions  . Other Other (See Comments)    Plastic tape tears skin    Current Outpatient Prescriptions  Medication Sig Dispense Refill  . carvedilol (COREG) 12.5 MG tablet Take 1 tablet (12.5 mg total) by mouth 2 (two) times daily with a meal.  180 tablet  3  . finasteride (PROSCAR) 5 MG tablet Take 1 tablet (5 mg total) by mouth daily.  90 tablet  1  . furosemide (LASIX) 40 MG tablet Take 1 tablet (40 mg total) by mouth daily.  90 tablet  3  . gabapentin (NEURONTIN) 300 MG capsule Take 1-2 capsules (300-600 mg total) by mouth 3 (three) times daily.  180 capsule  3  . glucose blood (ONE TOUCH ULTRA TEST) test strip use to test blood sugar two times daily as instructed by physician.  100 each  2  . insulin aspart protamine- aspart (NOVOLOG MIX 70/30) (70-30) 100 UNIT/ML injection Inject 7 Units into the skin 2 (two) times daily with a meal.      . losartan (COZAAR) 100 MG tablet Take 1 tablet (100 mg total) by mouth daily.  90 tablet  3  . nitroGLYCERIN (NITROSTAT) 0.4 MG SL tablet Place 1 tablet (0.4 mg total) under the tongue every 5 (five) minutes as needed for chest pain. For chest pain, max 3 doses  90 tablet  3  . oxyCODONE-acetaminophen (PERCOCET/ROXICET) 5-325 MG per tablet Take 1-2 tablets every 6 hrs/ prn/ pain  180 tablet  0  . traMADol (ULTRAM) 50 MG tablet Take 50 mg by mouth at bedtime.      . traZODone (DESYREL) 50 MG tablet Take 50 mg by mouth at bedtime.       Marland Kitchen warfarin (COUMADIN) 5 MG tablet Take 2.5 mg by mouth daily.  Except on Thursday no dose needed       No current facility-administered medications for this visit.   Physical Exam: Filed Vitals:   10/20/13 1644  BP: 130/90  Pulse: 73  Height: 5\' 8"  (1.727 m)  Weight: 169 lb (76.658 kg)    GEN- The patient is chronically ill appearing, alert and oriented x 3 today.   Head- normocephalic, atraumatic Eyes-  Sclera clear, conjunctiva pink Ears- hearing intact Oropharynx- clear Neck- supple, no JVP Lungs- Clear to ausculation bilaterally, normal work of breathing Chest- ICD pocket is well healed Heart- Regular rate and rhythm, no murmurs, rubs or gallops, PMI not laterally displaced GI- soft, NT, ND, + BS Extremities- s/p L AKA  ICD interrogation- reviewed in detail today,  See PACEART report  Assessment and Plan:  1. Chronic systolic dysfunction He has reached ERI See Pace Art report No changes today 99% BiV paced Risks, benefits, and alterantives to ICD gen change were discussed at length with the patient who wishes to proceed.  We will schedule the procedure at the next available time. He has not had an echo in several years.  We will obtain an echo prior to his procedure.  2. Permanent afib Rate controlled Continue coumadin long term

## 2013-10-22 ENCOUNTER — Ambulatory Visit: Payer: Medicare Other | Admitting: Physical Therapy

## 2013-10-22 DIAGNOSIS — IMO0001 Reserved for inherently not codable concepts without codable children: Secondary | ICD-10-CM | POA: Diagnosis not present

## 2013-10-25 ENCOUNTER — Ambulatory Visit: Payer: Medicare Other | Admitting: Physical Therapy

## 2013-10-25 DIAGNOSIS — IMO0001 Reserved for inherently not codable concepts without codable children: Secondary | ICD-10-CM | POA: Diagnosis not present

## 2013-10-26 ENCOUNTER — Encounter: Payer: Self-pay | Admitting: Internal Medicine

## 2013-10-27 ENCOUNTER — Ambulatory Visit: Payer: Medicare Other | Admitting: Physical Therapy

## 2013-10-27 DIAGNOSIS — IMO0001 Reserved for inherently not codable concepts without codable children: Secondary | ICD-10-CM | POA: Diagnosis not present

## 2013-10-28 ENCOUNTER — Ambulatory Visit: Payer: Medicare Other

## 2013-10-28 ENCOUNTER — Ambulatory Visit (INDEPENDENT_AMBULATORY_CARE_PROVIDER_SITE_OTHER): Payer: Medicare Other | Admitting: Family

## 2013-10-28 DIAGNOSIS — Z5181 Encounter for therapeutic drug level monitoring: Secondary | ICD-10-CM

## 2013-10-28 DIAGNOSIS — I4891 Unspecified atrial fibrillation: Secondary | ICD-10-CM

## 2013-10-28 DIAGNOSIS — I482 Chronic atrial fibrillation, unspecified: Secondary | ICD-10-CM

## 2013-10-28 LAB — POCT INR: INR: 1.9

## 2013-10-28 NOTE — Patient Instructions (Signed)
Take 1 tablet today. 1/2 tablet everyday.  Re-check in 5 weeks.  Anticoagulation Dose Instructions as of 10/28/2013     Dorene Grebe Tue Wed Thu Fri Sat   New Dose 2.5 mg 2.5 mg 2.5 mg 2.5 mg 2.5 mg 2.5 mg 2.5 mg    Description       Take 1 tablet today. 1/2 tablet everyday.  Re-check in 5 weeks.

## 2013-10-29 LAB — MDC_IDC_ENUM_SESS_TYPE_REMOTE
Brady Statistic AP VS Percent: 0 %
Brady Statistic AS VP Percent: 99.87 %
Brady Statistic AS VS Percent: 0.13 %
Brady Statistic RA Percent Paced: 0 %
Date Time Interrogation Session: 20150812161259
HIGH POWER IMPEDANCE MEASURED VALUE: 171 Ohm
HIGH POWER IMPEDANCE MEASURED VALUE: 47 Ohm
HighPow Impedance: 456 Ohm
HighPow Impedance: 62 Ohm
Lead Channel Impedance Value: 494 Ohm
Lead Channel Impedance Value: 513 Ohm
Lead Channel Impedance Value: 570 Ohm
Lead Channel Impedance Value: 893 Ohm
Lead Channel Pacing Threshold Amplitude: 0.75 V
Lead Channel Pacing Threshold Amplitude: 1.625 V
Lead Channel Pacing Threshold Pulse Width: 0.4 ms
Lead Channel Sensing Intrinsic Amplitude: 0.375 mV
Lead Channel Sensing Intrinsic Amplitude: 0.375 mV
Lead Channel Sensing Intrinsic Amplitude: 14.125 mV
Lead Channel Sensing Intrinsic Amplitude: 14.125 mV
Lead Channel Setting Pacing Amplitude: 2.5 V
Lead Channel Setting Pacing Pulse Width: 0.4 ms
Lead Channel Setting Pacing Pulse Width: 0.4 ms
Lead Channel Setting Sensing Sensitivity: 0.3 mV
MDC IDC MSMT BATTERY VOLTAGE: 2.62 V
MDC IDC MSMT LEADCHNL RA IMPEDANCE VALUE: 323 Ohm
MDC IDC MSMT LEADCHNL RV PACING THRESHOLD PULSEWIDTH: 0.4 ms
MDC IDC SET LEADCHNL LV PACING AMPLITUDE: 2.75 V
MDC IDC SET ZONE DETECTION INTERVAL: 350 ms
MDC IDC SET ZONE DETECTION INTERVAL: 450 ms
MDC IDC STAT BRADY AP VP PERCENT: 0 %
MDC IDC STAT BRADY RV PERCENT PACED: 99.87 %
Zone Setting Detection Interval: 290 ms
Zone Setting Detection Interval: 320 ms

## 2013-11-01 ENCOUNTER — Ambulatory Visit: Payer: Medicare Other | Admitting: Physical Therapy

## 2013-11-01 DIAGNOSIS — IMO0001 Reserved for inherently not codable concepts without codable children: Secondary | ICD-10-CM | POA: Diagnosis not present

## 2013-11-02 ENCOUNTER — Encounter: Payer: Self-pay | Admitting: Cardiology

## 2013-11-02 ENCOUNTER — Ambulatory Visit (INDEPENDENT_AMBULATORY_CARE_PROVIDER_SITE_OTHER): Payer: Medicare Other | Admitting: *Deleted

## 2013-11-02 ENCOUNTER — Ambulatory Visit (HOSPITAL_COMMUNITY): Payer: Medicare Other | Attending: Cardiovascular Disease

## 2013-11-02 DIAGNOSIS — I739 Peripheral vascular disease, unspecified: Secondary | ICD-10-CM | POA: Insufficient documentation

## 2013-11-02 DIAGNOSIS — I379 Nonrheumatic pulmonary valve disorder, unspecified: Secondary | ICD-10-CM | POA: Diagnosis not present

## 2013-11-02 DIAGNOSIS — I059 Rheumatic mitral valve disease, unspecified: Secondary | ICD-10-CM | POA: Insufficient documentation

## 2013-11-02 DIAGNOSIS — M109 Gout, unspecified: Secondary | ICD-10-CM | POA: Insufficient documentation

## 2013-11-02 DIAGNOSIS — I2510 Atherosclerotic heart disease of native coronary artery without angina pectoris: Secondary | ICD-10-CM | POA: Diagnosis not present

## 2013-11-02 DIAGNOSIS — E785 Hyperlipidemia, unspecified: Secondary | ICD-10-CM

## 2013-11-02 DIAGNOSIS — E119 Type 2 diabetes mellitus without complications: Secondary | ICD-10-CM | POA: Diagnosis not present

## 2013-11-02 DIAGNOSIS — N400 Enlarged prostate without lower urinary tract symptoms: Secondary | ICD-10-CM | POA: Insufficient documentation

## 2013-11-02 DIAGNOSIS — I701 Atherosclerosis of renal artery: Secondary | ICD-10-CM | POA: Diagnosis not present

## 2013-11-02 DIAGNOSIS — F329 Major depressive disorder, single episode, unspecified: Secondary | ICD-10-CM | POA: Diagnosis not present

## 2013-11-02 DIAGNOSIS — I509 Heart failure, unspecified: Secondary | ICD-10-CM | POA: Insufficient documentation

## 2013-11-02 DIAGNOSIS — A4902 Methicillin resistant Staphylococcus aureus infection, unspecified site: Secondary | ICD-10-CM | POA: Insufficient documentation

## 2013-11-02 DIAGNOSIS — I517 Cardiomegaly: Secondary | ICD-10-CM | POA: Diagnosis not present

## 2013-11-02 DIAGNOSIS — G4700 Insomnia, unspecified: Secondary | ICD-10-CM | POA: Insufficient documentation

## 2013-11-02 DIAGNOSIS — M129 Arthropathy, unspecified: Secondary | ICD-10-CM | POA: Insufficient documentation

## 2013-11-02 DIAGNOSIS — I4891 Unspecified atrial fibrillation: Secondary | ICD-10-CM | POA: Diagnosis present

## 2013-11-02 DIAGNOSIS — I079 Rheumatic tricuspid valve disease, unspecified: Secondary | ICD-10-CM | POA: Insufficient documentation

## 2013-11-02 DIAGNOSIS — I10 Essential (primary) hypertension: Secondary | ICD-10-CM

## 2013-11-02 DIAGNOSIS — F3289 Other specified depressive episodes: Secondary | ICD-10-CM | POA: Insufficient documentation

## 2013-11-02 LAB — CBC WITH DIFFERENTIAL/PLATELET
BASOS ABS: 0 10*3/uL (ref 0.0–0.1)
Basophils Relative: 0.6 % (ref 0.0–3.0)
Eosinophils Absolute: 0.2 10*3/uL (ref 0.0–0.7)
Eosinophils Relative: 2.3 % (ref 0.0–5.0)
HCT: 46.1 % (ref 39.0–52.0)
Hemoglobin: 15.3 g/dL (ref 13.0–17.0)
LYMPHS ABS: 1.6 10*3/uL (ref 0.7–4.0)
Lymphocytes Relative: 21.9 % (ref 12.0–46.0)
MCHC: 33.1 g/dL (ref 30.0–36.0)
MCV: 85.1 fl (ref 78.0–100.0)
MONO ABS: 0.6 10*3/uL (ref 0.1–1.0)
Monocytes Relative: 7.7 % (ref 3.0–12.0)
Neutro Abs: 5 10*3/uL (ref 1.4–7.7)
Neutrophils Relative %: 67.5 % (ref 43.0–77.0)
PLATELETS: 143 10*3/uL — AB (ref 150.0–400.0)
RBC: 5.41 Mil/uL (ref 4.22–5.81)
RDW: 15.9 % — AB (ref 11.5–15.5)
WBC: 7.4 10*3/uL (ref 4.0–10.5)

## 2013-11-02 LAB — BASIC METABOLIC PANEL
BUN: 34 mg/dL — AB (ref 6–23)
CHLORIDE: 107 meq/L (ref 96–112)
CO2: 26 meq/L (ref 19–32)
Calcium: 8.8 mg/dL (ref 8.4–10.5)
Creatinine, Ser: 2.1 mg/dL — ABNORMAL HIGH (ref 0.4–1.5)
GFR: 32.3 mL/min — ABNORMAL LOW (ref >60.00)
Glucose, Bld: 100 mg/dL — ABNORMAL HIGH (ref 70–99)
Potassium: 5.4 mEq/L — ABNORMAL HIGH (ref 3.5–5.1)
SODIUM: 137 meq/L (ref 135–145)

## 2013-11-02 LAB — PROTIME-INR
INR: 2.5 ratio — AB (ref 0.8–1.0)
Prothrombin Time: 27.1 s — ABNORMAL HIGH (ref 9.6–13.1)

## 2013-11-02 NOTE — Progress Notes (Signed)
2D Echo completed. 11/02/2013

## 2013-11-03 ENCOUNTER — Telehealth: Payer: Self-pay | Admitting: Family Medicine

## 2013-11-03 ENCOUNTER — Telehealth: Payer: Self-pay | Admitting: Internal Medicine

## 2013-11-03 ENCOUNTER — Ambulatory Visit: Payer: Medicare Other | Attending: Physical Medicine & Rehabilitation | Admitting: Physical Therapy

## 2013-11-03 DIAGNOSIS — Z4789 Encounter for other orthopedic aftercare: Secondary | ICD-10-CM | POA: Diagnosis not present

## 2013-11-03 DIAGNOSIS — IMO0001 Reserved for inherently not codable concepts without codable children: Secondary | ICD-10-CM | POA: Insufficient documentation

## 2013-11-03 DIAGNOSIS — R269 Unspecified abnormalities of gait and mobility: Secondary | ICD-10-CM | POA: Diagnosis not present

## 2013-11-03 DIAGNOSIS — R5381 Other malaise: Secondary | ICD-10-CM | POA: Diagnosis not present

## 2013-11-03 NOTE — Telephone Encounter (Signed)
Pt states he has [facility] on tomorrow, they informed him to get a rx for an electric scooter from his primary doctor. Pt states he need to pick up today if at all possible to take to his appt. Please call when available to pick.

## 2013-11-03 NOTE — Telephone Encounter (Signed)
rx ready to pick up

## 2013-11-03 NOTE — Telephone Encounter (Signed)
New Message  Pt called requests a call back to determine if he can or cannot take hiss insulin before his surgery. Please call back to discuss.

## 2013-11-03 NOTE — Telephone Encounter (Signed)
Pt has  type 2 diabetes. He takes insuline novolog mix 70/30, 7 units with breakfast and dinner. Pt is having a generator change on Tuesday September 8 th. Pt is aware to hold the insuline  the morning  of the procedure. Pt aware,he verbalized understanding.

## 2013-11-05 ENCOUNTER — Encounter (HOSPITAL_COMMUNITY): Payer: Self-pay | Admitting: Pharmacy Technician

## 2013-11-05 NOTE — Telephone Encounter (Signed)
I spoke with pt  

## 2013-11-08 ENCOUNTER — Encounter: Payer: Self-pay | Admitting: Internal Medicine

## 2013-11-08 DIAGNOSIS — I428 Other cardiomyopathies: Secondary | ICD-10-CM | POA: Diagnosis not present

## 2013-11-08 DIAGNOSIS — Z9861 Coronary angioplasty status: Secondary | ICD-10-CM | POA: Diagnosis not present

## 2013-11-08 DIAGNOSIS — N400 Enlarged prostate without lower urinary tract symptoms: Secondary | ICD-10-CM | POA: Diagnosis not present

## 2013-11-08 DIAGNOSIS — I442 Atrioventricular block, complete: Secondary | ICD-10-CM | POA: Diagnosis not present

## 2013-11-08 DIAGNOSIS — I2510 Atherosclerotic heart disease of native coronary artery without angina pectoris: Secondary | ICD-10-CM | POA: Diagnosis not present

## 2013-11-08 DIAGNOSIS — G4700 Insomnia, unspecified: Secondary | ICD-10-CM | POA: Diagnosis not present

## 2013-11-08 DIAGNOSIS — K5900 Constipation, unspecified: Secondary | ICD-10-CM | POA: Diagnosis not present

## 2013-11-08 DIAGNOSIS — Z794 Long term (current) use of insulin: Secondary | ICD-10-CM | POA: Diagnosis not present

## 2013-11-08 DIAGNOSIS — E119 Type 2 diabetes mellitus without complications: Secondary | ICD-10-CM | POA: Diagnosis not present

## 2013-11-08 DIAGNOSIS — S78119A Complete traumatic amputation at level between unspecified hip and knee, initial encounter: Secondary | ICD-10-CM | POA: Diagnosis not present

## 2013-11-08 DIAGNOSIS — I5022 Chronic systolic (congestive) heart failure: Secondary | ICD-10-CM | POA: Diagnosis not present

## 2013-11-08 DIAGNOSIS — Z7901 Long term (current) use of anticoagulants: Secondary | ICD-10-CM | POA: Diagnosis not present

## 2013-11-08 DIAGNOSIS — I10 Essential (primary) hypertension: Secondary | ICD-10-CM | POA: Diagnosis not present

## 2013-11-08 DIAGNOSIS — I447 Left bundle-branch block, unspecified: Secondary | ICD-10-CM | POA: Diagnosis not present

## 2013-11-08 DIAGNOSIS — F329 Major depressive disorder, single episode, unspecified: Secondary | ICD-10-CM | POA: Diagnosis not present

## 2013-11-08 DIAGNOSIS — I739 Peripheral vascular disease, unspecified: Secondary | ICD-10-CM | POA: Diagnosis not present

## 2013-11-08 DIAGNOSIS — F3289 Other specified depressive episodes: Secondary | ICD-10-CM | POA: Diagnosis not present

## 2013-11-08 DIAGNOSIS — I509 Heart failure, unspecified: Secondary | ICD-10-CM | POA: Diagnosis not present

## 2013-11-08 DIAGNOSIS — I4891 Unspecified atrial fibrillation: Secondary | ICD-10-CM | POA: Diagnosis not present

## 2013-11-08 DIAGNOSIS — Z4502 Encounter for adjustment and management of automatic implantable cardiac defibrillator: Secondary | ICD-10-CM | POA: Diagnosis not present

## 2013-11-08 DIAGNOSIS — Z8614 Personal history of Methicillin resistant Staphylococcus aureus infection: Secondary | ICD-10-CM | POA: Diagnosis not present

## 2013-11-08 DIAGNOSIS — N529 Male erectile dysfunction, unspecified: Secondary | ICD-10-CM | POA: Diagnosis not present

## 2013-11-08 DIAGNOSIS — M109 Gout, unspecified: Secondary | ICD-10-CM | POA: Diagnosis not present

## 2013-11-08 MED ORDER — CEFAZOLIN SODIUM-DEXTROSE 2-3 GM-% IV SOLR: 2.0000 g | INTRAVENOUS | Status: AC

## 2013-11-08 MED ORDER — SODIUM CHLORIDE 0.9 % IV SOLN
INTRAVENOUS | Status: DC
  Administered 2013-11-09: 13:00:00 via INTRAVENOUS

## 2013-11-08 MED ORDER — MUPIROCIN 2 % EX OINT
1.0000 "application " | TOPICAL_OINTMENT | Freq: Once | CUTANEOUS | Status: AC
  Administered 2013-11-09: 1 via TOPICAL
  Filled 2013-11-08: qty 22

## 2013-11-08 MED ORDER — SODIUM CHLORIDE 0.9 % IR SOLN
80.0000 mg | Status: AC
  Filled 2013-11-08: qty 2

## 2013-11-08 MED ORDER — CHLORHEXIDINE GLUCONATE 4 % EX LIQD
60.0000 mL | Freq: Once | CUTANEOUS | Status: DC
  Filled 2013-11-08: qty 60

## 2013-11-09 ENCOUNTER — Encounter (HOSPITAL_COMMUNITY)
Admission: RE | Disposition: A | Discharge status: Home / Self Care | Payer: Self-pay | Source: Ambulatory Visit | Attending: Internal Medicine

## 2013-11-09 ENCOUNTER — Ambulatory Visit: Payer: Medicare Other | Admitting: Physical Therapy

## 2013-11-09 ENCOUNTER — Ambulatory Visit (HOSPITAL_COMMUNITY)
Admission: RE | Admit: 2013-11-09 | Discharge: 2013-11-09 | Disposition: A | Discharge status: Home / Self Care | Payer: Medicare Other | Source: Ambulatory Visit | Attending: Internal Medicine | Admitting: Internal Medicine

## 2013-11-09 DIAGNOSIS — G4700 Insomnia, unspecified: Secondary | ICD-10-CM | POA: Insufficient documentation

## 2013-11-09 DIAGNOSIS — Z794 Long term (current) use of insulin: Secondary | ICD-10-CM | POA: Insufficient documentation

## 2013-11-09 DIAGNOSIS — Z9861 Coronary angioplasty status: Secondary | ICD-10-CM | POA: Insufficient documentation

## 2013-11-09 DIAGNOSIS — I442 Atrioventricular block, complete: Secondary | ICD-10-CM | POA: Insufficient documentation

## 2013-11-09 DIAGNOSIS — I447 Left bundle-branch block, unspecified: Secondary | ICD-10-CM | POA: Insufficient documentation

## 2013-11-09 DIAGNOSIS — I428 Other cardiomyopathies: Secondary | ICD-10-CM | POA: Insufficient documentation

## 2013-11-09 DIAGNOSIS — I4891 Unspecified atrial fibrillation: Secondary | ICD-10-CM | POA: Insufficient documentation

## 2013-11-09 DIAGNOSIS — Z7901 Long term (current) use of anticoagulants: Secondary | ICD-10-CM | POA: Insufficient documentation

## 2013-11-09 DIAGNOSIS — I10 Essential (primary) hypertension: Secondary | ICD-10-CM | POA: Insufficient documentation

## 2013-11-09 DIAGNOSIS — K5900 Constipation, unspecified: Secondary | ICD-10-CM | POA: Insufficient documentation

## 2013-11-09 DIAGNOSIS — S78119A Complete traumatic amputation at level between unspecified hip and knee, initial encounter: Secondary | ICD-10-CM | POA: Insufficient documentation

## 2013-11-09 DIAGNOSIS — Z4502 Encounter for adjustment and management of automatic implantable cardiac defibrillator: Secondary | ICD-10-CM | POA: Insufficient documentation

## 2013-11-09 DIAGNOSIS — I509 Heart failure, unspecified: Secondary | ICD-10-CM | POA: Insufficient documentation

## 2013-11-09 DIAGNOSIS — I255 Ischemic cardiomyopathy: Secondary | ICD-10-CM | POA: Diagnosis present

## 2013-11-09 DIAGNOSIS — F3289 Other specified depressive episodes: Secondary | ICD-10-CM | POA: Insufficient documentation

## 2013-11-09 DIAGNOSIS — I739 Peripheral vascular disease, unspecified: Secondary | ICD-10-CM | POA: Insufficient documentation

## 2013-11-09 DIAGNOSIS — I482 Chronic atrial fibrillation, unspecified: Secondary | ICD-10-CM | POA: Diagnosis present

## 2013-11-09 DIAGNOSIS — Z8614 Personal history of Methicillin resistant Staphylococcus aureus infection: Secondary | ICD-10-CM | POA: Insufficient documentation

## 2013-11-09 DIAGNOSIS — N529 Male erectile dysfunction, unspecified: Secondary | ICD-10-CM | POA: Insufficient documentation

## 2013-11-09 DIAGNOSIS — N400 Enlarged prostate without lower urinary tract symptoms: Secondary | ICD-10-CM | POA: Insufficient documentation

## 2013-11-09 DIAGNOSIS — E119 Type 2 diabetes mellitus without complications: Secondary | ICD-10-CM | POA: Insufficient documentation

## 2013-11-09 DIAGNOSIS — I2510 Atherosclerotic heart disease of native coronary artery without angina pectoris: Secondary | ICD-10-CM | POA: Insufficient documentation

## 2013-11-09 DIAGNOSIS — I5022 Chronic systolic (congestive) heart failure: Secondary | ICD-10-CM | POA: Diagnosis present

## 2013-11-09 DIAGNOSIS — F329 Major depressive disorder, single episode, unspecified: Secondary | ICD-10-CM | POA: Insufficient documentation

## 2013-11-09 DIAGNOSIS — M109 Gout, unspecified: Secondary | ICD-10-CM | POA: Insufficient documentation

## 2013-11-09 HISTORY — PX: BIV ICD GENERTAOR CHANGE OUT: SHX5745

## 2013-11-09 LAB — BASIC METABOLIC PANEL
Anion gap: 11 (ref 5–15)
BUN: 30 mg/dL — AB (ref 6–23)
CHLORIDE: 107 meq/L (ref 96–112)
CO2: 22 meq/L (ref 19–32)
Calcium: 8.9 mg/dL (ref 8.4–10.5)
Creatinine, Ser: 1.71 mg/dL — ABNORMAL HIGH (ref 0.50–1.35)
GFR calc Af Amer: 43 mL/min — ABNORMAL LOW (ref >90)
GFR calc non Af Amer: 37 mL/min — ABNORMAL LOW (ref >90)
Glucose, Bld: 103 mg/dL — ABNORMAL HIGH (ref 70–99)
POTASSIUM: 5.2 meq/L (ref 3.7–5.3)
Sodium: 140 mEq/L (ref 137–147)

## 2013-11-09 LAB — SURGICAL PCR SCREEN
MRSA, PCR: POSITIVE — AB
Staphylococcus aureus: POSITIVE — AB

## 2013-11-09 LAB — GLUCOSE, CAPILLARY: Glucose-Capillary: 85 mg/dL (ref 70–99)

## 2013-11-09 LAB — PROTIME-INR
INR: 1.18 (ref 0.00–1.49)
Prothrombin Time: 15 seconds (ref 11.6–15.2)

## 2013-11-09 SURGERY — BIV ICD GENERTAOR CHANGE OUT
Anesthesia: LOCAL

## 2013-11-09 MED ORDER — SODIUM CHLORIDE 0.9 % IJ SOLN: 3.0000 mL | INTRAMUSCULAR | Status: DC | PRN

## 2013-11-09 MED ORDER — SODIUM CHLORIDE 0.9 % IV SOLN: 250.0000 mL | INTRAVENOUS | Status: DC | PRN

## 2013-11-09 MED ORDER — SODIUM CHLORIDE 0.9 % IJ SOLN: 3.0000 mL | Freq: Two times a day (BID) | INTRAMUSCULAR | Status: DC

## 2013-11-09 MED ORDER — HYDROCODONE-ACETAMINOPHEN 5-325 MG PO TABS: 1.0000 | ORAL_TABLET | ORAL | Status: DC | PRN

## 2013-11-09 MED ORDER — ONDANSETRON HCL 4 MG/2ML IJ SOLN: 4.0000 mg | Freq: Four times a day (QID) | INTRAMUSCULAR | Status: DC | PRN

## 2013-11-09 MED ORDER — MUPIROCIN 2 % EX OINT
TOPICAL_OINTMENT | CUTANEOUS | Status: AC
  Administered 2013-11-09: 1 via TOPICAL
  Filled 2013-11-09: qty 22

## 2013-11-09 MED ORDER — FENTANYL CITRATE 0.05 MG/ML IJ SOLN
INTRAMUSCULAR | Status: AC
  Filled 2013-11-09: qty 2

## 2013-11-09 MED ORDER — MIDAZOLAM HCL 5 MG/5ML IJ SOLN
INTRAMUSCULAR | Status: AC
  Filled 2013-11-09: qty 5

## 2013-11-09 MED ORDER — ACETAMINOPHEN 325 MG PO TABS: 325.0000 mg | ORAL_TABLET | ORAL | Status: DC | PRN

## 2013-11-09 NOTE — Discharge Instructions (Signed)
° ° ° °  Supplemental Discharge Instructions for  Pacemaker/Defibrillator Patients  WOUND CARE   Keep the wound area clean and dry.  Do not get this area wet for one week. No showers for 10 days    The tape/steri-strips on your wound will fall off; do not pull them off.  No bandage is needed on the site.  DO  NOT apply any creams, oils, or ointments to the wound area.   If you notice any drainage or discharge from the wound, any swelling or bruising at the site, or you develop a fever > 101? F after you are discharged home, call the office at once.  Special Instructions   You are still able to use cellular telephones; use the ear opposite the side where you have your pacemaker/defibrillator.  Avoid carrying your cellular phone near your device.   When traveling through airports, show security personnel your identification card to avoid being screened in the metal detectors.  Ask the security personnel to use the hand wand.   Avoid arc welding equipment, MRI testing (magnetic resonance imaging), TENS units (transcutaneous nerve stimulators).  Call the office for questions about other devices.   Avoid electrical appliances that are in poor condition or are not properly grounded.   Microwave ovens are safe to be near or to operate.  Additional information for defibrillator patients should your device go off:   If your device goes off ONCE and you feel fine afterward, notify the device clinic nurses.   If your device goes off ONCE and you do not feel well afterward, call 911.   If your device goes off TWICE, call 911.   If your device goes off THREE times in one day, call 911.  DO NOT DRIVE YOURSELF OR A FAMILY MEMBER WITH A DEFIBRILLATOR TO THE HOSPITAL--CALL 911.   OK to resume rehab in 1 week.

## 2013-11-09 NOTE — H&P (View-Only) (Signed)
PCP: Laurey Morale, MD Primary Cardiologist:  Dr Daneen Schick  [name] is a 79 y.o. male with a h/o mixed ischemic/ nonischemic CM, LBBB, NYHA Class III CHF sp BiV ICD (MDT) by Dr Leonia Reeves who presents today to for follow-up in the Electrophysiology device clinic. He is doing very well from a cardiac stand point but continues to struggle with vascular disease. He is sp L AKA several months ago.  He continues to adjust to his prosthesis.  He is not very active.  His BiV ICD has reached ERI battery status and he presents for further evaluation.  Today, he  denies symptoms of palpitations, chest pain, orthopnea, PND, dizziness, presyncope, syncope, or other neurologic sequela.   Past Medical History  Diagnosis Date  . Gout   . Benign prostatic hypertrophy     takes Proscar daily  . Atrial fibrillation     takes Warfarin daily  . Chronic systolic dysfunction of left ventricle     a. mixed ischemic and nonischemic CM,  EF 35%. b. s/p AICD implantation.  . ED (erectile dysfunction)   . Arthritis   . Pacemaker     medtronic  . CAD (coronary artery disease)     a. s/p mid LAD stenting with DES 2008 Dr. Daneen Schick  . ICD (implantable cardiac defibrillator) in place     medtronic, Dr. Rayann Heman  . ICD (implantable cardiac defibrillator) in place     due for check in Feb/2013  . Sleep apnea     hx of "had surgery for"  . Automatic implantable cardioverter-defibrillator in situ   . Depression   . Numbness and tingling     Hx; 78f left foot  . PAD (peripheral artery disease)     Severe by PV angiogram 09/2011  . Renal artery stenosis     s/p stenting 2009  . PAD (peripheral artery disease)     s/p multiple LLE bypass grafts; left mid-distal SCA occlusion by 08/2012 duplex  . Hypertension     takes Carvedilol and Losartan daily  . Constipation     takes Miralax daily as needed and Colace daily   . Type II diabetes mellitus     takes Novolog 70/30  . CHF (congestive heart failure)    takes Lasix daily  . History of MRSA infection   . Insomnia     TAKES TRAZODONE NIGHTLY    Past Surgical History  Procedure Laterality Date  . Turp vaporization    . Cardiac defibrillator placement  12/26/09    pacemaker combo  . Cervical epidural injection  2013  . Femoral-tibial bypass graft  09/25/2011    Procedure: BYPASS GRAFT FEMORAL-TIBIAL ARTERY;  Surgeon: Mal Misty, MD;  Location: [facility] OR;  Service: Vascular;  Laterality: Left;  Left Femoral - Anterior Tibial Bypass;  saphenous vein graft from left leg  . Intraoperative arteriogram  09/25/2011    Procedure: INTRA OPERATIVE ARTERIOGRAM;  Surgeon: Mal Misty, MD;  Location: [place];  Service: Vascular;  Laterality: Left;  . Femoral-tibial bypass graft  02/07/2012    Procedure: BYPASS GRAFT FEMORAL-TIBIAL ARTERY;  Surgeon: Rosetta Posner, MD;  Location: [place];  Service: Vascular;  Laterality: Left;  Thrombectomy Left Femoral - Tibial Bypass Graft  . Embolectomy  02/07/2012    Procedure: EMBOLECTOMY;  Surgeon: Rosetta Posner, MD;  Location: [place];  Service: Vascular;  Laterality: Left;  . Femoral-tibial bypass graft  04/03/2012    Procedure: BYPASS GRAFT FEMORAL-TIBIAL ARTERY;  Surgeon: Mal Misty, MD;  Location: [place];  Service: Vascular;  Laterality: Left;  Redo  . Insert / replace / remove pacemaker  2007  . Coronary angioplasty with stent placement  ~ 2000  . Coronary angioplasty    . Uvulopalatopharyngoplasty (uppp)/tonsillectomy/septoplasty  06/30/2003    Archie Endo 06/30/2003 (07/10/2012)  . Shoulder open rotator cuff repair Right 2001    repair of lacerated right/notes 10/11/1999  (07/10/2012)  . Foot surgery Right 03/20/2001    "have plates and screws in; didn't break it" (07/10/2012)  . Carpal tunnel release Right 2002    Archie Endo 03/20/2001 (07/10/2012)  . Biceps tendon repair Right 2001    Archie Endo 03/20/2001 (07/10/2012)  . Renal artery stent  2009  . Femoral-popliteal bypass graft Left 09/16/2012    Procedure: LEFT FEMORAL-POPLITEAL  BYPASS GRAFT WITH GORTEX Propaten Graft 6x80 Thin Wall and Left lower leg Angiogram;  Surgeon: Mal Misty, MD;  Location: [place];  Service: Vascular;  Laterality: Left;  . Colonoscopy      Hx; of  . Tonsillectomy    . Adenoidectomy      Hx; of   . Femoral-tibial bypass graft Left 09/30/2012    Procedure: REDO LEFT FEMORAL-ANTERIOR TIBIAL ARTERY BYPASS USING COMPOSITE CEPHALIC AND BASILIC VEIN GRAFT FROM LEFT ARM;  Surgeon: Mal Misty, MD;  Location: [place];  Service: Vascular;  Laterality: Left;  . I&d extremity Left 10/21/2012    Procedure: EXPLORATION AND DEBRIDEMENT OF LEFT GROIN WOUND;  Surgeon: Mal Misty, MD;  Location: [place];  Service: Vascular;  Laterality: Left;  . Patch angioplasty Left 10/21/2012    Procedure: PATCH ANGIOPLASTY;  Surgeon: Mal Misty, MD;  Location: [place];  Service: Vascular;  Laterality: Left;  . Groin debridement Left 11/12/2012    Procedure: CLOSURE INGUINAL WOUND;  Surgeon: Mal Misty, MD;  Location: [place];  Service: Vascular;  Laterality: Left;  . Embolectomy Left 12/16/2012    Procedure: THROMBECTOMY  LEFT LEG BYPASS;  Surgeon: Elam Dutch, MD;  Location: [place];  Service: Vascular;  Laterality: Left;  . Leg amputation above knee Left 02/04/2013    DR LAWSON  . Amputation Left 02/04/2013    Procedure: AMPUTATION ABOVE KNEE-LEFT;  Surgeon: Mal Misty, MD;  Location: [place];  Service: Vascular;  Laterality: Left;  . Removal of graft Left 04/16/2013    Procedure: I & D LEFT AKA WOUND, POSSIBLE REMOVAL OF INFECTED GORTEX GRAFT;  Surgeon: Mal Misty, MD;  Location: [place];  Service: Vascular;  Laterality: Left;    History   Social History  . Marital Status: Widowed    Spouse Name: N/A    Number of Children: N/A  . Years of Education: N/A   Occupational History  . Retired    Social History Main Topics  . Smoking status: Never Smoker   . Smokeless tobacco: Never Used     Comment: 1-2 cigars when golfing  . Alcohol Use: 2.4 oz/week     2 Glasses of wine, 2 Shots of liquor per week     Comment: 07/10/2012 "glass of wine or vodka tonic 3 X/wk"  . Drug Use: No  . Sexual Activity: Not Currently   Other Topics Concern  . Not on file   Social History Narrative   Lives in [place] with significant other,  [name].    Family History  Problem Relation Age of Onset  . Cancer      breast/fhx  . Heart disease  fhx  . Diabetes Neg Hx   . Cancer Father     Allergies  Allergen Reactions  . Other Other (See Comments)    Plastic tape tears skin    Current Outpatient Prescriptions  Medication Sig Dispense Refill  . carvedilol (COREG) 12.5 MG tablet Take 1 tablet (12.5 mg total) by mouth 2 (two) times daily with a meal.  180 tablet  3  . finasteride (PROSCAR) 5 MG tablet Take 1 tablet (5 mg total) by mouth daily.  90 tablet  1  . furosemide (LASIX) 40 MG tablet Take 1 tablet (40 mg total) by mouth daily.  90 tablet  3  . gabapentin (NEURONTIN) 300 MG capsule Take 1-2 capsules (300-600 mg total) by mouth 3 (three) times daily.  180 capsule  3  . glucose blood (ONE TOUCH ULTRA TEST) test strip use to test blood sugar two times daily as instructed by physician.  100 each  2  . insulin aspart protamine- aspart (NOVOLOG MIX 70/30) (70-30) 100 UNIT/ML injection Inject 7 Units into the skin 2 (two) times daily with a meal.      . losartan (COZAAR) 100 MG tablet Take 1 tablet (100 mg total) by mouth daily.  90 tablet  3  . nitroGLYCERIN (NITROSTAT) 0.4 MG SL tablet Place 1 tablet (0.4 mg total) under the tongue every 5 (five) minutes as needed for chest pain. For chest pain, max 3 doses  90 tablet  3  . oxyCODONE-acetaminophen (PERCOCET/ROXICET) 5-325 MG per tablet Take 1-2 tablets every 6 hrs/ prn/ pain  180 tablet  0  . traMADol (ULTRAM) 50 MG tablet Take 50 mg by mouth at bedtime.      . traZODone (DESYREL) 50 MG tablet Take 50 mg by mouth at bedtime.       Marland Kitchen warfarin (COUMADIN) 5 MG tablet Take 2.5 mg by mouth daily.  Except on Thursday no dose needed       No current facility-administered medications for this visit.   Physical Exam: Filed Vitals:   10/20/13 1644  BP: 130/90  Pulse: 73  Height: 5\' 8"  (1.727 m)  Weight: 169 lb (76.658 kg)    GEN- The patient is chronically ill appearing, alert and oriented x 3 today.   Head- normocephalic, atraumatic Eyes-  Sclera clear, conjunctiva pink Ears- hearing intact Oropharynx- clear Neck- supple, no JVP Lungs- Clear to ausculation bilaterally, normal work of breathing Chest- ICD pocket is well healed Heart- Regular rate and rhythm, no murmurs, rubs or gallops, PMI not laterally displaced GI- soft, NT, ND, + BS Extremities- s/p L AKA  ICD interrogation- reviewed in detail today,  See PACEART report  Assessment and Plan:  1. Chronic systolic dysfunction He has reached ERI See Pace Art report No changes today 99% BiV paced Risks, benefits, and alterantives to ICD gen change were discussed at length with the patient who wishes to proceed.  We will schedule the procedure at the next available time. He has not had an echo in several years.  We will obtain an echo prior to his procedure.  2. Permanent afib Rate controlled Continue coumadin long term

## 2013-11-09 NOTE — Op Note (Signed)
SURGEON:  Thompson Grayer, MD      PREPROCEDURE DIAGNOSES:   1. nonischemic cardiomyopathy.   2. New York Heart Association class II, heart failure chronically.   3. Complete heart block  4. LBBB  5. Permanent afib      POSTPROCEDURE DIAGNOSES:   1. nonischemic cardiomyopathy.   2. New York Heart Association class II, heart failure chronically.   3. Complete heart block  4. LBBB  5. Permanent afib  4. LBBB   PROCEDURES:    1.  BiV ICD pulse generator replacement   2. Skin pocket revision     INTRODUCTION:  [name] is a 79 y.o. male an ischemic CM (EF 55%--> previously 35% prior to CRT), NYHA Class II CHF, complete heart block, permanent atrial fibrillation and prior ICD implantation who presents today for ICD pulse generator replacement for ERI battery status.       DESCRIPTION OF PROCEDURE:  Informed written consent was obtained and the patient was brought to the electrophysiology lab in the fasting state.  The patient required no sedation for the procedure today.  He received fentanyl 25 mcg IV for back pain. The patient's left chest was prepped and draped in the usual sterile fashion by the EP lab staff.  The skin overlying the left deltopectoral region was infiltrated with lidocaine for local analgesia.  A 5-cm incision was made over the existing ICD pocket.  Electrocautery was used to assure hemostasis.  The device was exposed and removed from the pocket. A single silk stitch was identified and removed which previously secured the device within the pocket. The device was disconnected from the leads.  The leads were examined thoroughly and their integrity confirmed to be intact.   The right atrial lead was confirmed to be a Medtronic model [id number]  (serial # [id number]) lead implanted 12/25/2009.  The right ventricular lead was confirmed to be a Medtronic model [id number] (serial number [id number]) right ventricular defibrillator lead implanted on the same date as the atrial  lead.The left ventricular lead was confirmed to be a Medtronic model [phone] (serial number [id number]) left ventricular lead  implanted on the same date as the atrial lead..   Atrial lead afib-waves measured 0.1 mV.  The right ventricular lead R-wave measured 23 mV with impedance of 499 ohms and a threshold of 0.5 volts at 0.5 milliseconds.  Left ventricular lead impedance was 921 Ohms with a threshold of 1.5V @0 .5 sec.  All three leads were then connected to a Medtronic Viva XT CRT-D model DTBA1D1 (SN [id number]) BiV ICD. The pocket was revised to accomodate this new device.   The pocket was  irrigated with copious gentamicin solution.  The defibrillator was placed into the  Pocket.  The pocket was then closed in 2 layers with 2.0 Vicryl suture  for the subcutaneous and subcuticular layers. EBL<52ml. Steri-Strips and a  sterile dressing were then applied.   DFT testing was not performed today.  There were no early apparent complications.      CONCLUSIONS:   1. Nonischemic cardiomyopathy with chronic New York Heart Association class II heart failure, complete heart block, permanent atrial fibrillation, and good response to CRT previously  2. BiV ICD at elective replacement indicator  3. DFT testing not performed today  4. No early apparent complications.   Jonathan Rinks [name],MD 11/09/2013 4:53 PM

## 2013-11-09 NOTE — Interval H&P Note (Signed)
History and Physical Interval Note:  11/09/2013 2:17 PM  Boyd Kerbs  has presented today for surgery, with the diagnosis of battery depletion  The various methods of treatment have been discussed with the patient and family. After consideration of risks, benefits and other options for treatment, the patient has consented to  Procedure(s): BIV ICD [place] (N/A) as a surgical intervention .  The patient's history has been reviewed, patient examined, no change in status, stable for surgery.  I have reviewed the patient's chart and labs.  Questions were answered to the patient's satisfaction.    ICD Criteria  Current LVEF:55% ;Obtained < 1 month ago.  NYHA Functional Classification: Class II  Heart Failure History:  Yes, Duration of heart failure since onset is > 9 months  Non-Ischemic Dilated Cardiomyopathy History:  Yes, timeframe is > 9 months  Atrial Fibrillation/Atrial Flutter:  Yes, A-Fib/A-Flutter type: Permanent (>1 year).  Ventricular Tachycardia History:  No.  Cardiac Arrest History:  No  History of Syndromes with Risk of Sudden Death:  No.  Previous ICD:  Yes, ICD Type:  CRT-D, Reason for ICD:  Primary prevention.  35%  Electrophysiology Study: No.  Prior MI: No.  PPM: No.  OSA:  No  Patient Life Expectancy of >=1 year: Yes.  Anticoagulation Therapy:  Patient is on anticoagulation therapy, anticoagulation was held prior to procedure.   Beta Blocker Therapy:  Yes.   Ace Inhibitor/ARB Therapy:  Yes.   He has had response to CRT with normalization in his EF and improvement in his CHF symptoms.  Today, we discussed CRT-D vs CRT-P implant.  He is clear that he would like to continue with a CRT-D device.  Per HRS guidelines, it is appropriate to proceed with CRT-D generator change even though his EF has improved.    Thompson Grayer

## 2013-11-11 ENCOUNTER — Encounter: Payer: Medicare Other | Admitting: Physical Therapy

## 2013-11-16 ENCOUNTER — Encounter: Payer: Medicare Other | Attending: Physical Medicine & Rehabilitation | Admitting: Physical Medicine & Rehabilitation

## 2013-11-16 ENCOUNTER — Encounter: Payer: Self-pay | Admitting: Physical Medicine & Rehabilitation

## 2013-11-16 ENCOUNTER — Ambulatory Visit: Payer: Medicare Other | Admitting: Physical Therapy

## 2013-11-16 VITALS — BP 151/63 | HR 62 | Resp 14 | Wt 183.8 lb

## 2013-11-16 DIAGNOSIS — G547 Phantom limb syndrome without pain: Secondary | ICD-10-CM | POA: Diagnosis present

## 2013-11-16 DIAGNOSIS — M79672 Pain in left foot: Secondary | ICD-10-CM

## 2013-11-16 DIAGNOSIS — S78119A Complete traumatic amputation at level between unspecified hip and knee, initial encounter: Secondary | ICD-10-CM | POA: Diagnosis not present

## 2013-11-16 DIAGNOSIS — G8918 Other acute postprocedural pain: Secondary | ICD-10-CM | POA: Diagnosis present

## 2013-11-16 DIAGNOSIS — M79609 Pain in unspecified limb: Secondary | ICD-10-CM

## 2013-11-16 DIAGNOSIS — IMO0001 Reserved for inherently not codable concepts without codable children: Secondary | ICD-10-CM | POA: Diagnosis not present

## 2013-11-16 MED ORDER — OXYCODONE-ACETAMINOPHEN 7.5-325 MG PO TABS: 1.0000 | ORAL_TABLET | Freq: Four times a day (QID) | ORAL | Status: DC | PRN

## 2013-11-16 MED ORDER — CARBAMAZEPINE 200 MG PO TABS: 200.0000 mg | ORAL_TABLET | Freq: Every day | ORAL | Status: DC

## 2013-11-16 NOTE — Patient Instructions (Signed)
PLEASE CALL ME WITH ANY PROBLEMS OR QUESTIONS (#[phone]).      

## 2013-11-16 NOTE — Progress Notes (Signed)
Subjective:    Patient ID: [name], male    DOB: 08/24/1934, 79 y.o.   MRN: [id number]  HPI  Mr. Mcfetridge is back regarding his left AKA and associated phantom limb pain. His pain continues to be significant at night. His percocet only lasts a few hours. He cannot tolerate the gabapentin higher than 300mg  tid. He continues to be involved in gait training in outpt rehab. This is progressing. He is ambulating in therapy with a cane. His pain tends to be better when he walks or is up during the day.   He had his pacer replaced a week ago and this seems to be doing well.    Pain Inventory Average Pain 8 Pain Right Now 8 My pain is sharp and tingling  In the last 24 hours, has pain interfered with the following? General activity 7 Relation with others 9 Enjoyment of life 6 What TIME of day is your pain at its worst? varies Sleep (in general) Poor  Pain is worse with: some activites Pain improves with: medication Relief from Meds: 8  Mobility use a walker how many minutes can you walk? 10 use a wheelchair  Function retired  Neuro/Psych bladder control problems numbness tingling trouble walking spasms  Prior Studies Any changes since last visit?  no  Physicians involved in your care Any changes since last visit?  no   Family History  Problem Relation Age of Onset  . Cancer      breast/fhx  . Heart disease      fhx  . Diabetes Neg Hx   . Cancer Father    History   Social History  . Marital Status: Widowed    Spouse Name: N/A    Number of Children: N/A  . Years of Education: N/A   Occupational History  . Retired    Social History Main Topics  . Smoking status: Never Smoker   . Smokeless tobacco: Never Used     Comment: 1-2 cigars when golfing  . Alcohol Use: 2.4 oz/week    2 Glasses of wine, 2 Shots of liquor per week     Comment: 07/10/2012 "glass of wine or vodka tonic 3 X/wk"  . Drug Use: No  . Sexual Activity: Not Currently   Other  Topics Concern  . None   Social History Narrative   Lives in [place] with significant other,  [name].   Past Surgical History  Procedure Laterality Date  . Turp vaporization    . Cardiac defibrillator placement  12/26/09    pacemaker combo  . Cervical epidural injection  2013  . Femoral-tibial bypass graft  09/25/2011    Procedure: BYPASS GRAFT FEMORAL-TIBIAL ARTERY;  Surgeon: Mal Misty, MD;  [facility];  Service: Vascular;  Laterality: Left;  Left Femoral - Anterior Tibial Bypass;  saphenous vein graft from left leg  . Intraoperative arteriogram  09/25/2011    Procedure: INTRA OPERATIVE ARTERIOGRAM;  Surgeon: Mal Misty, MD;  Location: [place];  Service: Vascular;  Laterality: Left;  . Femoral-tibial bypass graft  02/07/2012    Procedure: BYPASS GRAFT FEMORAL-TIBIAL ARTERY;  Surgeon: Rosetta Posner, MD;  Location: [place];  Service: Vascular;  Laterality: Left;  Thrombectomy Left Femoral - Tibial Bypass Graft  . Embolectomy  02/07/2012    Procedure: EMBOLECTOMY;  Surgeon: Rosetta Posner, MD;  Location: [place];  Service: Vascular;  Laterality: Left;  . Femoral-tibial bypass graft  04/03/2012    Procedure: BYPASS GRAFT FEMORAL-TIBIAL  ARTERY;  Surgeon: Mal Misty, MD;  Location: [place];  Service: Vascular;  Laterality: Left;  Redo  . Insert / replace / remove pacemaker  2007  . Coronary angioplasty with stent placement  ~ 2000  . Coronary angioplasty    . Uvulopalatopharyngoplasty (uppp)/tonsillectomy/septoplasty  06/30/2003    Archie Endo 06/30/2003 (07/10/2012)  . Shoulder open rotator cuff repair Right 2001    repair of lacerated right/notes 10/11/1999  (07/10/2012)  . Foot surgery Right 03/20/2001    "have plates and screws in; didn't break it" (07/10/2012)  . Carpal tunnel release Right 2002    Archie Endo 03/20/2001 (07/10/2012)  . Biceps tendon repair Right 2001    Archie Endo 03/20/2001 (07/10/2012)  . Renal artery stent  2009  . Femoral-popliteal bypass graft Left 09/16/2012    Procedure: LEFT  FEMORAL-POPLITEAL BYPASS GRAFT WITH GORTEX Propaten Graft 6x80 Thin Wall and Left lower leg Angiogram;  Surgeon: Mal Misty, MD;  Location: [place];  Service: Vascular;  Laterality: Left;  . Colonoscopy      Hx; of  . Tonsillectomy    . Adenoidectomy      Hx; of   . Femoral-tibial bypass graft Left 09/30/2012    Procedure: REDO LEFT FEMORAL-ANTERIOR TIBIAL ARTERY BYPASS USING COMPOSITE CEPHALIC AND BASILIC VEIN GRAFT FROM LEFT ARM;  Surgeon: Mal Misty, MD;  Location: [place];  Service: Vascular;  Laterality: Left;  . I&d extremity Left 10/21/2012    Procedure: EXPLORATION AND DEBRIDEMENT OF LEFT GROIN WOUND;  Surgeon: Mal Misty, MD;  Location: [place];  Service: Vascular;  Laterality: Left;  . Patch angioplasty Left 10/21/2012    Procedure: PATCH ANGIOPLASTY;  Surgeon: Mal Misty, MD;  Location: [place];  Service: Vascular;  Laterality: Left;  . Groin debridement Left 11/12/2012    Procedure: CLOSURE INGUINAL WOUND;  Surgeon: Mal Misty, MD;  Location: [place];  Service: Vascular;  Laterality: Left;  . Embolectomy Left 12/16/2012    Procedure: THROMBECTOMY  LEFT LEG BYPASS;  Surgeon: Elam Dutch, MD;  Location: [place];  Service: Vascular;  Laterality: Left;  . Leg amputation above knee Left 02/04/2013    DR LAWSON  . Amputation Left 02/04/2013    Procedure: AMPUTATION ABOVE KNEE-LEFT;  Surgeon: Mal Misty, MD;  Location: [place];  Service: Vascular;  Laterality: Left;  . Removal of graft Left 04/16/2013    Procedure: I & D LEFT AKA WOUND, POSSIBLE REMOVAL OF INFECTED GORTEX GRAFT;  Surgeon: Mal Misty, MD;  Location: [place];  Service: Vascular;  Laterality: Left;   Past Medical History  Diagnosis Date  . Gout   . Benign prostatic hypertrophy     takes Proscar daily  . Atrial fibrillation     takes Warfarin daily  . Chronic systolic dysfunction of left ventricle     a. mixed ischemic and nonischemic CM,  EF 35%. b. s/p AICD implantation.  . ED (erectile dysfunction)   .  Arthritis   . Pacemaker     medtronic  . CAD (coronary artery disease)     a. s/p mid LAD stenting with DES 2008 Dr. Daneen Schick  . ICD (implantable cardiac defibrillator) in place     medtronic, Dr. Rayann Heman  . ICD (implantable cardiac defibrillator) in place   . Sleep apnea     hx of "had surgery for"  . Automatic implantable cardioverter-defibrillator in situ   . Depression   . Numbness and tingling     Hx; 82f  left foot  . PAD (peripheral artery disease)     Severe by PV angiogram 09/2011  . Renal artery stenosis     s/p stenting 2009  . PAD (peripheral artery disease)     s/p multiple LLE bypass grafts; left mid-distal SCA occlusion by 08/2012 duplex  . Hypertension     takes Carvedilol and Losartan daily  . Constipation     takes Miralax daily as needed and Colace daily   . Type II diabetes mellitus     takes Novolog 70/30  . CHF (congestive heart failure)     takes Lasix daily  . History of MRSA infection   . Insomnia     TAKES TRAZODONE NIGHTLY   BP 151/63  Pulse 62  Resp 14  Wt 183 lb 12.8 oz (83.371 kg)  SpO2 95%  Opioid Risk Score:   Fall Risk Score: High Fall Risk (>13 points) (previoulsy educated and given handout) Review of Systems  Musculoskeletal: Positive for gait problem.       Spasms  Skin: Positive for wound.       Skin tear right arm above elbow.  Tegaderm given to cover open area  Neurological: Positive for numbness.       Tingling  All other systems reviewed and are negative.      Objective:   Physical Exam  Constitutional: He is oriented to person, place, and time. He appears well-developed and well-nourished.  HENT:  Head: Normocephalic and atraumatic.  Eyes: Conjunctivae are normal. Pupils are equal, round, and reactive to light.  Neck: Neck supple.  Cardiovascular: Normal rate and regular rhythm. No rubs, gallops. Pacer site dressed No murmur heard.  Respiratory: Effort normal and breath sounds normal. No respiratory distress. He has  no wheezes.  GI: Soft. Bowel sounds are normal. He exhibits no distension. There is no tenderness.  Musculoskeletal:  L-AKA prosthesis in place and fitting appropriately.  Neurological: He is alert and oriented to person, place, and time.  Follows commands. Strength grossly intact in UE's at 5/5. RLE is 4/5 prox to 5/5 distally. Left HF 4/5. mild sensory loss in distal right leg/foot.  Skin: Intact.  Psychiatric: He has a normal mood and affect. His speech is normal and behavior is normal. Judgment and thought content normal. Cognition and memory are normal.    Assessment/Plan:  1. Functional deficits secondary to left AKA. Continue gait training  2. Pain Management: cannot tolerate increased gabapentin -add tegretol 200mg  qhs -increase percocet to 7.5mg  Consider long acting opiate -discussed again aggressive massage and visual feed back therapy  -reviewed using topical agents/modalities to thigh to help further with pain at night -prosthesis continues to fit appropriately.  3.  Follow up with me in about 2 months . 15 minutes of face to face patient care time were spent during this visit. All questions were encouraged and answered.

## 2013-11-18 ENCOUNTER — Ambulatory Visit: Payer: Medicare Other | Admitting: Physical Therapy

## 2013-11-18 DIAGNOSIS — IMO0001 Reserved for inherently not codable concepts without codable children: Secondary | ICD-10-CM | POA: Diagnosis not present

## 2013-11-22 ENCOUNTER — Encounter: Payer: Self-pay | Admitting: Internal Medicine

## 2013-11-22 ENCOUNTER — Ambulatory Visit (INDEPENDENT_AMBULATORY_CARE_PROVIDER_SITE_OTHER): Payer: Medicare Other | Admitting: *Deleted

## 2013-11-22 DIAGNOSIS — I4891 Unspecified atrial fibrillation: Secondary | ICD-10-CM

## 2013-11-22 DIAGNOSIS — I482 Chronic atrial fibrillation, unspecified: Secondary | ICD-10-CM

## 2013-11-22 DIAGNOSIS — I255 Ischemic cardiomyopathy: Secondary | ICD-10-CM

## 2013-11-22 DIAGNOSIS — I5022 Chronic systolic (congestive) heart failure: Secondary | ICD-10-CM

## 2013-11-22 DIAGNOSIS — I509 Heart failure, unspecified: Secondary | ICD-10-CM

## 2013-11-22 DIAGNOSIS — I2589 Other forms of chronic ischemic heart disease: Secondary | ICD-10-CM

## 2013-11-22 LAB — MDC_IDC_ENUM_SESS_TYPE_INCLINIC
Battery Remaining Longevity: 108 mo
Battery Voltage: 3.11 V
Brady Statistic RA Percent Paced: 0 %
Brady Statistic RV Percent Paced: 99.87 %
HIGH POWER IMPEDANCE MEASURED VALUE: 53 Ohm
HighPow Impedance: 171 Ohm
HighPow Impedance: 40 Ohm
Lead Channel Impedance Value: 342 Ohm
Lead Channel Impedance Value: 513 Ohm
Lead Channel Impedance Value: 513 Ohm
Lead Channel Impedance Value: 817 Ohm
Lead Channel Pacing Threshold Amplitude: 0.75 V
Lead Channel Pacing Threshold Pulse Width: 0.4 ms
Lead Channel Pacing Threshold Pulse Width: 0.4 ms
Lead Channel Sensing Intrinsic Amplitude: 15.5 mV
Lead Channel Setting Pacing Amplitude: 2 V
Lead Channel Setting Pacing Amplitude: 2.5 V
Lead Channel Setting Pacing Pulse Width: 0.4 ms
Lead Channel Setting Pacing Pulse Width: 0.4 ms
MDC IDC MSMT LEADCHNL LV IMPEDANCE VALUE: 437 Ohm
MDC IDC MSMT LEADCHNL LV PACING THRESHOLD AMPLITUDE: 1.5 V
MDC IDC MSMT LEADCHNL RA SENSING INTR AMPL: 0.125 mV
MDC IDC MSMT LEADCHNL RA SENSING INTR AMPL: 0.25 mV
MDC IDC SESS DTM: 20150921110718
MDC IDC SET LEADCHNL RV SENSING SENSITIVITY: 0.3 mV
MDC IDC SET ZONE DETECTION INTERVAL: 290 ms
MDC IDC SET ZONE DETECTION INTERVAL: 320 ms
MDC IDC STAT BRADY AP VP PERCENT: 0 %
MDC IDC STAT BRADY AP VS PERCENT: 0 %
MDC IDC STAT BRADY AS VP PERCENT: 99.82 %
MDC IDC STAT BRADY AS VS PERCENT: 0.18 %
Zone Setting Detection Interval: 410 ms
Zone Setting Detection Interval: 450 ms

## 2013-11-22 NOTE — Progress Notes (Signed)
Wound check appointment. Steri-strips removed. Wound without redness or edema. Incision edges approximated, wound well healed. Normal device function. Thresholds, sensing, and impedances consistent with implant measurements.  Histogram distribution appropriate for patient and level of activity. No ventricular arrhythmias noted.  A-fib + coumadin.   Patient educated about wound care, arm mobility, lifting restrictions, shock plan. ROV in 3 months with implanting physician.

## 2013-11-23 ENCOUNTER — Telehealth: Payer: Self-pay | Admitting: Family Medicine

## 2013-11-23 ENCOUNTER — Ambulatory Visit: Payer: Medicare Other | Admitting: Physical Therapy

## 2013-11-23 DIAGNOSIS — IMO0001 Reserved for inherently not codable concepts without codable children: Secondary | ICD-10-CM | POA: Diagnosis not present

## 2013-11-23 NOTE — Telephone Encounter (Signed)
Pt needs new rx for smaller needle #2.  [facility] 100 primemail/

## 2013-11-24 ENCOUNTER — Ambulatory Visit: Payer: Medicare Other | Admitting: Physical Therapy

## 2013-11-24 DIAGNOSIS — IMO0001 Reserved for inherently not codable concepts without codable children: Secondary | ICD-10-CM | POA: Diagnosis not present

## 2013-11-25 MED ORDER — INSULIN PEN NEEDLE 31G X 8 MM MISC: Status: DC

## 2013-11-25 NOTE — Addendum Note (Signed)
Addended by: Aggie Hacker A on: 11/25/2013 10:58 AM   Modules accepted: Orders

## 2013-11-25 NOTE — Telephone Encounter (Signed)
I sent script e-scribe. 

## 2013-11-30 ENCOUNTER — Encounter: Payer: Medicare Other | Admitting: Physical Therapy

## 2013-12-02 ENCOUNTER — Ambulatory Visit: Payer: Medicare Other

## 2013-12-02 ENCOUNTER — Encounter: Payer: Medicare Other | Admitting: Physical Therapy

## 2013-12-13 ENCOUNTER — Telehealth: Payer: Self-pay | Admitting: Internal Medicine

## 2013-12-13 NOTE — Telephone Encounter (Signed)
Pt reports heard beeping this am around 730am. Transmission was sent in around 749 am. Normal transmission with no alerts. Pt aware of this.

## 2013-12-13 NOTE — Telephone Encounter (Signed)
New Prob   Pt states he heard his pacemaker beep this morning and is requesting to speak to somoene regarding this. No pain at this time.

## 2013-12-14 ENCOUNTER — Ambulatory Visit: Payer: Medicare Other | Attending: Physical Medicine & Rehabilitation | Admitting: Physical Therapy

## 2013-12-14 DIAGNOSIS — Z89612 Acquired absence of left leg above knee: Secondary | ICD-10-CM | POA: Diagnosis present

## 2013-12-14 DIAGNOSIS — R269 Unspecified abnormalities of gait and mobility: Secondary | ICD-10-CM | POA: Diagnosis present

## 2013-12-14 DIAGNOSIS — R5381 Other malaise: Secondary | ICD-10-CM | POA: Insufficient documentation

## 2013-12-16 ENCOUNTER — Ambulatory Visit: Payer: Medicare Other

## 2013-12-16 ENCOUNTER — Ambulatory Visit: Payer: Medicare Other | Admitting: Physical Therapy

## 2013-12-16 ENCOUNTER — Ambulatory Visit (INDEPENDENT_AMBULATORY_CARE_PROVIDER_SITE_OTHER): Payer: Medicare Other

## 2013-12-16 ENCOUNTER — Ambulatory Visit (INDEPENDENT_AMBULATORY_CARE_PROVIDER_SITE_OTHER): Payer: Medicare Other | Admitting: Family

## 2013-12-16 DIAGNOSIS — I482 Chronic atrial fibrillation, unspecified: Secondary | ICD-10-CM

## 2013-12-16 DIAGNOSIS — Z23 Encounter for immunization: Secondary | ICD-10-CM

## 2013-12-16 DIAGNOSIS — Z89612 Acquired absence of left leg above knee: Secondary | ICD-10-CM | POA: Diagnosis not present

## 2013-12-16 DIAGNOSIS — Z5181 Encounter for therapeutic drug level monitoring: Secondary | ICD-10-CM

## 2013-12-16 LAB — POCT INR: INR: 1.9

## 2013-12-16 NOTE — Patient Instructions (Signed)
1/2 tablet everyday.  Re-check in 4 weeks.  Anticoagulation Dose Instructions as of 12/16/2013     Dorene Grebe Tue Wed Thu Fri Sat   New Dose 2.5 mg 2.5 mg 2.5 mg 2.5 mg 2.5 mg 2.5 mg 2.5 mg    Description       1/2 tablet everyday.  Re-check in 4 weeks.

## 2013-12-17 ENCOUNTER — Other Ambulatory Visit: Payer: Self-pay

## 2013-12-20 ENCOUNTER — Encounter: Payer: Self-pay | Admitting: Vascular Surgery

## 2013-12-20 ENCOUNTER — Ambulatory Visit: Payer: Medicare Other | Admitting: Physical Therapy

## 2013-12-20 DIAGNOSIS — Z89612 Acquired absence of left leg above knee: Secondary | ICD-10-CM | POA: Diagnosis not present

## 2013-12-21 ENCOUNTER — Encounter: Payer: Self-pay | Admitting: Vascular Surgery

## 2013-12-21 ENCOUNTER — Ambulatory Visit (INDEPENDENT_AMBULATORY_CARE_PROVIDER_SITE_OTHER): Payer: Medicare Other | Admitting: Vascular Surgery

## 2013-12-21 ENCOUNTER — Ambulatory Visit (HOSPITAL_COMMUNITY)
Admission: RE | Admit: 2013-12-21 | Discharge: 2013-12-21 | Disposition: A | Discharge status: Home / Self Care | Payer: Medicare Other | Source: Ambulatory Visit | Attending: Vascular Surgery | Admitting: Vascular Surgery

## 2013-12-21 ENCOUNTER — Encounter: Payer: Medicare Other | Admitting: Physical Therapy

## 2013-12-21 VITALS — BP 176/71 | HR 60 | Ht 68.0 in | Wt 183.0 lb

## 2013-12-21 DIAGNOSIS — Z48812 Encounter for surgical aftercare following surgery on the circulatory system: Secondary | ICD-10-CM | POA: Insufficient documentation

## 2013-12-21 DIAGNOSIS — I739 Peripheral vascular disease, unspecified: Secondary | ICD-10-CM | POA: Diagnosis present

## 2013-12-21 DIAGNOSIS — I6523 Occlusion and stenosis of bilateral carotid arteries: Secondary | ICD-10-CM

## 2013-12-21 DIAGNOSIS — I10 Essential (primary) hypertension: Secondary | ICD-10-CM | POA: Diagnosis not present

## 2013-12-21 DIAGNOSIS — E119 Type 2 diabetes mellitus without complications: Secondary | ICD-10-CM | POA: Insufficient documentation

## 2013-12-21 DIAGNOSIS — T8131XA Disruption of external operation (surgical) wound, not elsewhere classified, initial encounter: Secondary | ICD-10-CM

## 2013-12-21 DIAGNOSIS — E785 Hyperlipidemia, unspecified: Secondary | ICD-10-CM | POA: Diagnosis not present

## 2013-12-21 NOTE — Progress Notes (Addendum)
Referred by:  Laurey Morale, MD [place], [place] [address]  UEA:[id number] B [name] is a 79 y.o. male patient of Dr. 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  He has a new prothesis and is in physical therapy for gait training.  He has no new complaints.  He continues to have phantom pain in the left AKA stump and is followed by Dr. Naaman Plummer.  He is on gabapentin and percocet 7.5 PRN for pain control.  He continues to take coumadin daily and has had a new pace maker placed 3 weeks ago due to old pace maker battery dying.    He is here today for follow up right LE ABI studies.  He reports no opeinngs in the skin, no right foot pain, or edema.  He has had a history of claudication in the left upper extremity with numbness and tingling in the left hand. He reports no symptoms today in the upper extremities.  He has known left subclavian occlusion that is asymptomatic.     Past Medical History  Diagnosis Date  . Gout   . Benign prostatic hypertrophy     takes Proscar daily  . Atrial fibrillation     takes Warfarin daily  . Chronic systolic dysfunction of left ventricle     a. mixed ischemic and nonischemic CM,  EF 35%. b. s/p AICD implantation.  . ED (erectile dysfunction)   . Arthritis   . Pacemaker     medtronic  . CAD (coronary artery disease)     a. s/p mid LAD stenting with DES 2008 Dr. Daneen Schick  . ICD (implantable cardiac defibrillator) in place     medtronic, Dr. Rayann Heman  . ICD (implantable cardiac defibrillator) in place   . Sleep apnea     hx of "had surgery for"  . Automatic implantable cardioverter-defibrillator in situ   . Depression   . Numbness and tingling     Hx; 54f left foot  . PAD (peripheral artery disease)     Severe by PV angiogram 09/2011  . Renal artery stenosis     s/p stenting 2009  . PAD (peripheral artery disease)     s/p multiple LLE bypass grafts; left mid-distal SCA occlusion by 08/2012 duplex  . Hypertension     takes Carvedilol  and Losartan daily  . Constipation     takes Miralax daily as needed and Colace daily   . Type II diabetes mellitus     takes Novolog 70/30  . CHF (congestive heart failure)     takes Lasix daily  . History of MRSA infection   . Insomnia     TAKES TRAZODONE NIGHTLY    Past Surgical History  Procedure Laterality Date  . Turp vaporization    . Cardiac defibrillator placement  12/26/09    pacemaker combo  . Cervical epidural injection  2013  . Femoral-tibial bypass graft  09/25/2011    Procedure: BYPASS GRAFT FEMORAL-TIBIAL ARTERY;  Surgeon: Mal Misty, MD;  Location: [facility] OR;  Service: Vascular;  Laterality: Left;  Left Femoral - Anterior Tibial Bypass;  saphenous vein graft from left leg  . Intraoperative arteriogram  09/25/2011    Procedure: INTRA OPERATIVE ARTERIOGRAM;  Surgeon: Mal Misty, MD;  Location: [place];  Service: Vascular;  Laterality: Left;  . Femoral-tibial bypass graft  02/07/2012    Procedure: BYPASS GRAFT FEMORAL-TIBIAL ARTERY;  Surgeon: Rosetta Posner, MD;  Location: [place];  Service:  Vascular;  Laterality: Left;  Thrombectomy Left Femoral - Tibial Bypass Graft  . Embolectomy  02/07/2012    Procedure: EMBOLECTOMY;  Surgeon: Rosetta Posner, MD;  Location: [place];  Service: Vascular;  Laterality: Left;  . Femoral-tibial bypass graft  04/03/2012    Procedure: BYPASS GRAFT FEMORAL-TIBIAL ARTERY;  Surgeon: Mal Misty, MD;  Location: [place];  Service: Vascular;  Laterality: Left;  Redo  . Insert / replace / remove pacemaker  2007  . Coronary angioplasty with stent placement  ~ 2000  . Coronary angioplasty    . Uvulopalatopharyngoplasty (uppp)/tonsillectomy/septoplasty  06/30/2003    Archie Endo 06/30/2003 (07/10/2012)  . Shoulder open rotator cuff repair Right 2001    repair of lacerated right/notes 10/11/1999  (07/10/2012)  . Foot surgery Right 03/20/2001    "have plates and screws in; didn't break it" (07/10/2012)  . Carpal tunnel release Right 2002    Archie Endo 03/20/2001 (07/10/2012)  .  Biceps tendon repair Right 2001    Archie Endo 03/20/2001 (07/10/2012)  . Renal artery stent  2009  . Femoral-popliteal bypass graft Left 09/16/2012    Procedure: LEFT FEMORAL-POPLITEAL BYPASS GRAFT WITH GORTEX Propaten Graft 6x80 Thin Wall and Left lower leg Angiogram;  Surgeon: Mal Misty, MD;  Location: [place];  Service: Vascular;  Laterality: Left;  . Colonoscopy      Hx; of  . Tonsillectomy    . Adenoidectomy      Hx; of   . Femoral-tibial bypass graft Left 09/30/2012    Procedure: REDO LEFT FEMORAL-ANTERIOR TIBIAL ARTERY BYPASS USING COMPOSITE CEPHALIC AND BASILIC VEIN GRAFT FROM LEFT ARM;  Surgeon: Mal Misty, MD;  Location: [place];  Service: Vascular;  Laterality: Left;  . I&d extremity Left 10/21/2012    Procedure: EXPLORATION AND DEBRIDEMENT OF LEFT GROIN WOUND;  Surgeon: Mal Misty, MD;  Location: [place];  Service: Vascular;  Laterality: Left;  . Patch angioplasty Left 10/21/2012    Procedure: PATCH ANGIOPLASTY;  Surgeon: Mal Misty, MD;  Location: [place];  Service: Vascular;  Laterality: Left;  . Groin debridement Left 11/12/2012    Procedure: CLOSURE INGUINAL WOUND;  Surgeon: Mal Misty, MD;  Location: [place];  Service: Vascular;  Laterality: Left;  . Embolectomy Left 12/16/2012    Procedure: THROMBECTOMY  LEFT LEG BYPASS;  Surgeon: Elam Dutch, MD;  Location: [place];  Service: Vascular;  Laterality: Left;  . Leg amputation above knee Left 02/04/2013    DR LAWSON  . Amputation Left 02/04/2013    Procedure: AMPUTATION ABOVE KNEE-LEFT;  Surgeon: Mal Misty, MD;  Location: [place];  Service: Vascular;  Laterality: Left;  . Removal of graft Left 04/16/2013    Procedure: I & D LEFT AKA WOUND, POSSIBLE REMOVAL OF INFECTED GORTEX GRAFT;  Surgeon: Mal Misty, MD;  Location: [place];  Service: Vascular;  Laterality: Left;    History   Social History  . Marital Status: Widowed    Spouse Name: N/A    Number of Children: N/A  . Years of Education: N/A   Occupational  History  . Retired    Social History Main Topics  . Smoking status: Never Smoker   . Smokeless tobacco: Never Used     Comment: 1-2 cigars when golfing  . Alcohol Use: 2.4 oz/week    2 Glasses of wine, 2 Shots of liquor per week     Comment: 07/10/2012 "glass of wine or vodka tonic 3 X/wk"  . Drug Use: No  .  Sexual Activity: Not Currently   Other Topics Concern  . Not on file   Social History Narrative   Lives in [place] with significant other,  [name].    Family History  Problem Relation Age of Onset  . Cancer      breast/fhx  . Heart disease      fhx  . Diabetes Neg Hx   . Cancer Father       Current Outpatient Prescriptions  Medication Sig Dispense Refill  . carbamazepine (TEGRETOL) 200 MG tablet Take 1 tablet (200 mg total) by mouth at bedtime.  30 tablet  3  . carvedilol (COREG) 12.5 MG tablet Take 1 tablet (12.5 mg total) by mouth 2 (two) times daily with a meal.  180 tablet  3  . finasteride (PROSCAR) 5 MG tablet Take 1 tablet (5 mg total) by mouth daily.  90 tablet  1  . furosemide (LASIX) 40 MG tablet Take 1 tablet (40 mg total) by mouth daily.  90 tablet  3  . gabapentin (NEURONTIN) 300 MG capsule Take 300 mg by mouth 3 (three) times daily.      Marland Kitchen glucose blood (ONE TOUCH ULTRA TEST) test strip use to test blood sugar two times daily as instructed by physician.  100 each  2  . insulin aspart protamine- aspart (NOVOLOG MIX 70/30) (70-30) 100 UNIT/ML injection Inject 7 Units into the skin 2 (two) times daily with a meal.      . Insulin Pen Needle 31G X 8 MM MISC Use 2-3 times per day and diagnosis code is 250.00  100 each  1  . losartan (COZAAR) 100 MG tablet Take 1 tablet (100 mg total) by mouth daily.  90 tablet  3  . naproxen sodium (ANAPROX) 220 MG tablet Take 440 mg by mouth daily as needed (Takes in the evening.).      Marland Kitchen nitroGLYCERIN (NITROSTAT) 0.4 MG SL tablet Place 1 tablet (0.4 mg total) under the tongue every 5 (five) minutes as needed for chest pain.  For chest pain, max 3 doses  90 tablet  3  . oxyCODONE-acetaminophen (PERCOCET) 7.5-325 MG per tablet Take 1 tablet by mouth every 6 (six) hours as needed for pain.  75 tablet  0  . traMADol (ULTRAM) 50 MG tablet Take 50 mg by mouth at bedtime as needed.       . warfarin (COUMADIN) 5 MG tablet Take 2.5 mg by mouth daily. Except on Thursday no dose needed      . traZODone (DESYREL) 50 MG tablet Take 50 mg by mouth at bedtime as needed.        No current facility-administered medications for this visit.     Allergies  Allergen Reactions  . Other Other (See Comments)    Plastic tape tears skin      REVIEW OF SYSTEMS:  (Positives checked otherwise negative)  CARDIOVASCULAR:  []  chest pain, []  chest pressure, []  palpitations, []  shortness of breath when laying flat, []  shortness of breath with exertion,  []  pain in feet when walking, []  pain in feet when laying flat, []  history of blood clot in veins (DVT), []  history of phlebitis, []  swelling in legs, []  varicose veins  PULMONARY:  []  productive cough, []  asthma, []  wheezing  NEUROLOGIC:  []  weakness in arms or legs, []  numbness in arms or legs, []  difficulty speaking or slurred speech, []  temporary loss of vision in one eye, []  dizziness [x] phantom limb pain n the left LE  HEMATOLOGIC:  []  bleeding problems, []  problems with blood clotting too easily  MUSCULOSKEL:  [x]  joint pain, []  joint swelling  GASTROINTEST:  []  vomiting blood, []  blood in stool     GENITOURINARY:  []  burning with urination, []  blood in urine  PSYCHIATRIC:  []  history of major depression  INTEGUMENTARY:  []  rashes, []  ulcers  CONSTITUTIONAL:  []  fever, []  chills  For VQI Use Only    PRE-ADM LIVING: Home  AMB STATUS: Wheelchair  CAD Sx: None  PRIOR CHF: None  STRESS TEST: [ ]  No, [ ]  Normal, [ ]  + ischemia, [ ]  + MI, [ ]  Both   Physical Examination  Filed Vitals:   12/21/13 1342  BP: 176/71  Pulse: 60  Height: 5\' 8"  (1.727 m)  Weight: 183  lb (83.008 kg)  SpO2: 100%    Body mass index is 27.83 kg/(m^2).  General: A&O x 3  Head: [place]  Ear/Nose/Throat: Hearing grossly intact, nares w/o erythema or drainage   Eyes: PERRLA   Neck: Supple, no carotid bruits  Pulmonary: Sym exp, good air movt, CTAB, no rales, rhonchi, & wheezing   Cardiac: RRR has a new pace maker  Vascular: Vessel Right Left  Radial Palpable Palpable  Ulnar Palpable Palpable  Brachial Palpable Palpable  Carotid Palpable, witout bruit Palpable, without bruit  Aorta Not palpable N/A  Femoral Palpable Palpable  Popliteal Not palpable Not palpable  PT not Palpable not Palpable  DP not Palpable not Palpable   Gastrointestinal: soft, NTND     Musculoskeletal: M/S 5/5 throughout  Right LE and bilateral upper ext  Neurologic: CN 2-12 intact    Psychiatric: Judgment intact, Mood & affect appropriate for situation  Dermatologic: See M/S exam for extremity exam, no rashes otherwise noted  Lymph : No Cervical  Non-Invasive Vascular Imaging ABI Right 0.86 with monophasic wave form 12/21/2013 ABI right 0.80 with monophasic wave forms 03/09/2013   Medical Decision Making  DAISY MCNEEL is a 79 y.o. male who presents with: His left AKA has healed well and he is doning a new prothesis.  He is still using a wheel chair for mobility on a daily basis, but is in PT for gait training.  His right LE ABI is stable and he has no sign of ischemic skin changes.  We will get a follow up ABI of the right LE in 1 year.    His left upper extremity is asymptomatic and there are no symptoms of recent stroke.  We will get follow up carotid duplex studies in 1 year.  He was seen today in clinic by Dr. Kellie Simmering.    [facility] Office: [phone]   12/21/2013, 2:08 PM Agree with above assessment Patient progressing nicely with prosthetic training left AKA Return in one year for ABIs right leg and carotid duplex exam

## 2013-12-22 NOTE — Addendum Note (Signed)
Addended by: Mena Goes on: 12/22/2013 09:24 AM   Modules accepted: Orders

## 2013-12-24 ENCOUNTER — Ambulatory Visit: Payer: Medicare Other | Admitting: Physical Therapy

## 2013-12-24 DIAGNOSIS — Z89612 Acquired absence of left leg above knee: Secondary | ICD-10-CM | POA: Diagnosis not present

## 2013-12-28 ENCOUNTER — Ambulatory Visit (INDEPENDENT_AMBULATORY_CARE_PROVIDER_SITE_OTHER): Payer: Medicare Other | Admitting: Family Medicine

## 2013-12-28 ENCOUNTER — Ambulatory Visit: Payer: Medicare Other | Admitting: Physical Therapy

## 2013-12-28 ENCOUNTER — Encounter: Payer: Self-pay | Admitting: Family Medicine

## 2013-12-28 VITALS — BP 178/99 | HR 63 | Temp 98.4°F

## 2013-12-28 DIAGNOSIS — N3001 Acute cystitis with hematuria: Secondary | ICD-10-CM

## 2013-12-28 DIAGNOSIS — Z89612 Acquired absence of left leg above knee: Secondary | ICD-10-CM | POA: Diagnosis not present

## 2013-12-28 LAB — POCT URINALYSIS DIPSTICK
BILIRUBIN UA: NEGATIVE
Ketones, UA: NEGATIVE
PH UA: 6
SPEC GRAV UA: 1.025
UROBILINOGEN UA: 0.2

## 2013-12-28 MED ORDER — SULFAMETHOXAZOLE-TMP DS 800-160 MG PO TABS: 1.0000 | ORAL_TABLET | Freq: Two times a day (BID) | ORAL | Status: DC

## 2013-12-28 NOTE — Progress Notes (Signed)
   Subjective:    Patient ID: [name], male    DOB: 03-29-1934, 79 y.o.   MRN: [id number]  HPI Here for the onset this am of burning on urination, urgency, and seeing blood in the urine. No fever.    Review of Systems  Constitutional: Negative.   Genitourinary: Positive for dysuria, urgency, frequency and hematuria. Negative for flank pain.       Objective:   Physical Exam  Constitutional: He appears well-developed and well-nourished.  Abdominal: Soft. Bowel sounds are normal. He exhibits no distension and no mass. There is no tenderness. There is no rebound and no guarding.          Assessment & Plan:  Treat with Bactrim DS. Drink fluids. Culture pending

## 2013-12-28 NOTE — Addendum Note (Signed)
Addended by: [name] A on: 12/28/2013 12:27 PM   Modules accepted: Orders

## 2013-12-28 NOTE — Addendum Note (Signed)
Addended by: Aggie Hacker A on: 12/28/2013 10:27 AM   Modules accepted: Orders

## 2013-12-28 NOTE — Progress Notes (Signed)
Pre visit review using our clinic review tool, if applicable. No additional management support is needed unless otherwise documented below in the visit note. 

## 2013-12-30 ENCOUNTER — Ambulatory Visit: Payer: Medicare Other | Admitting: Physical Therapy

## 2013-12-30 DIAGNOSIS — Z89612 Acquired absence of left leg above knee: Secondary | ICD-10-CM | POA: Diagnosis not present

## 2013-12-31 ENCOUNTER — Encounter: Payer: Medicare Other | Admitting: Internal Medicine

## 2014-01-02 LAB — URINE CULTURE

## 2014-01-05 ENCOUNTER — Telehealth: Payer: Self-pay | Admitting: Family Medicine

## 2014-01-05 MED ORDER — CIPROFLOXACIN HCL 500 MG PO TABS: 500.0000 mg | ORAL_TABLET | Freq: Two times a day (BID) | ORAL | Status: DC

## 2014-01-05 NOTE — Telephone Encounter (Signed)
Cancel the Bactrim and call in Cipro 500 mg bid for 10 days

## 2014-01-05 NOTE — Telephone Encounter (Signed)
Pt stopped taking the Rx you gave him due to side effects (hand tremors) and would like to discuss other options please.

## 2014-01-05 NOTE — Telephone Encounter (Signed)
I spoke with pt and he stopped the Bactrim DS due to hand tremors, said he couldn't hold anything in hands without shaking. Is there something else we can send in?

## 2014-01-05 NOTE — Telephone Encounter (Signed)
I sent in new script to [facility] and left a voice message for pt with this information. Also I took the Bactrim off of pt's medication list and added it to his drug allergy list.

## 2014-01-10 ENCOUNTER — Encounter: Payer: Self-pay | Admitting: Physical Therapy

## 2014-01-10 ENCOUNTER — Ambulatory Visit: Payer: Medicare Other | Attending: Physical Medicine & Rehabilitation | Admitting: Physical Therapy

## 2014-01-10 ENCOUNTER — Ambulatory Visit (INDEPENDENT_AMBULATORY_CARE_PROVIDER_SITE_OTHER): Payer: Medicare Other | Admitting: Family

## 2014-01-10 DIAGNOSIS — R6889 Other general symptoms and signs: Secondary | ICD-10-CM

## 2014-01-10 DIAGNOSIS — R5381 Other malaise: Secondary | ICD-10-CM | POA: Diagnosis not present

## 2014-01-10 DIAGNOSIS — R269 Unspecified abnormalities of gait and mobility: Secondary | ICD-10-CM | POA: Diagnosis present

## 2014-01-10 DIAGNOSIS — Z89612 Acquired absence of left leg above knee: Secondary | ICD-10-CM | POA: Diagnosis not present

## 2014-01-10 DIAGNOSIS — I482 Chronic atrial fibrillation, unspecified: Secondary | ICD-10-CM

## 2014-01-10 DIAGNOSIS — Z5181 Encounter for therapeutic drug level monitoring: Secondary | ICD-10-CM

## 2014-01-10 LAB — POCT INR: INR: 2.2

## 2014-01-10 NOTE — Patient Instructions (Signed)
1/2 tablet everyday.  Re-check in 4 weeks.  Anticoagulation Dose Instructions as of 01/10/2014      Dorene Grebe Tue Wed Thu Fri Sat   New Dose 2.5 mg 2.5 mg 2.5 mg 2.5 mg 2.5 mg 2.5 mg 2.5 mg    Description        1/2 tablet everyday.  Re-check in 4 weeks.

## 2014-01-10 NOTE — Therapy (Signed)
Physical Therapy Treatment  Patient Details  Name: [name] MRN: [id number] Date of Birth: 15-Dec-1934  Encounter Date: 01/10/2014      PT End of Session - 01/10/14 1241    Visit Number [id number]   Number of Visits 44   Date for PT Re-Evaluation 02/04/14   PT Start Time 0851   PT Stop Time 0940   PT Time Calculation (min) 49 min   Equipment Utilized During Treatment Gait belt;Other (comment)  AKA prosthesis (C-leg)   Activity Tolerance Patient tolerated treatment well      Past Medical History  Diagnosis Date  . Gout   . Benign prostatic hypertrophy     takes Proscar daily  . Atrial fibrillation     takes Warfarin daily  . Chronic systolic dysfunction of left ventricle     a. mixed ischemic and nonischemic CM,  EF 35%. b. s/p AICD implantation.  . ED (erectile dysfunction)   . Arthritis   . Pacemaker     medtronic  . CAD (coronary artery disease)     a. s/p mid LAD stenting with DES 2008 Dr. Daneen Schick  . ICD (implantable cardiac defibrillator) in place     medtronic, Dr. Rayann Heman  . ICD (implantable cardiac defibrillator) in place   . Sleep apnea     hx of "had surgery for"  . Automatic implantable cardioverter-defibrillator in situ   . Depression   . Numbness and tingling     Hx; 71fleft foot  . PAD (peripheral artery disease)     Severe by PV angiogram 09/2011  . Renal artery stenosis     s/p stenting 2009  . PAD (peripheral artery disease)     s/p multiple LLE bypass grafts; left mid-distal SCA occlusion by 08/2012 duplex  . Hypertension     takes Carvedilol and Losartan daily  . Constipation     takes Miralax daily as needed and Colace daily   . Type II diabetes mellitus     takes Novolog 70/30  . CHF (congestive heart failure)     takes Lasix daily  . History of MRSA infection   . Insomnia     TAKES TRAZODONE NIGHTLY    Past Surgical History  Procedure Laterality Date  . Turp vaporization    . Cardiac defibrillator placement  12/26/09   pacemaker combo  . Cervical epidural injection  2013  . Femoral-tibial bypass graft  09/25/2011    Procedure: BYPASS GRAFT FEMORAL-TIBIAL ARTERY;  Surgeon: JMal Misty MD;  [facility];  Service: Vascular;  Laterality: Left;  Left Femoral - Anterior Tibial Bypass;  saphenous vein graft from left leg  . Intraoperative arteriogram  09/25/2011    Procedure: INTRA OPERATIVE ARTERIOGRAM;  Surgeon: JMal Misty MD;  [place] [facility]  Service: Vascular;  Laterality: Left;  . Femoral-tibial bypass graft  02/07/2012    Procedure: BYPASS GRAFT FEMORAL-TIBIAL ARTERY;  Surgeon: TRosetta Posner MD;  [facility] [facility]  Service: Vascular;  Laterality: Left;  Thrombectomy Left Femoral - Tibial Bypass Graft  . Embolectomy  02/07/2012    Procedure: EMBOLECTOMY;  Surgeon: TRosetta Posner MD;  [place] [facility]  Service: Vascular;  Laterality: Left;  . Femoral-tibial bypass graft  04/03/2012    Procedure: BYPASS GRAFT FEMORAL-TIBIAL ARTERY;  Surgeon: JMal Misty MD;  [place] [facility]  Service: Vascular;  Laterality: Left;  Redo  . Insert / replace / remove pacemaker  2007  . Coronary angioplasty with stent placement  ~  2000  . Coronary angioplasty    . Uvulopalatopharyngoplasty (uppp)/tonsillectomy/septoplasty  06/30/2003    Archie Endo 06/30/2003 (07/10/2012)  . Shoulder open rotator cuff repair Right 2001    repair of lacerated right/notes 10/11/1999  (07/10/2012)  . Foot surgery Right 03/20/2001    "have plates and screws in; didn't break it" (07/10/2012)  . Carpal tunnel release Right 2002    Archie Endo 03/20/2001 (07/10/2012)  . Biceps tendon repair Right 2001    Archie Endo 03/20/2001 (07/10/2012)  . Renal artery stent  2009  . Femoral-popliteal bypass graft Left 09/16/2012    Procedure: LEFT FEMORAL-POPLITEAL BYPASS GRAFT WITH GORTEX Propaten Graft 6x80 Thin Wall and Left lower leg Angiogram;  Surgeon: Mal Misty, MD;  Location: [place];  Service: Vascular;  Laterality: Left;  . Colonoscopy      Hx; of  . Tonsillectomy    .  Adenoidectomy      Hx; of   . Femoral-tibial bypass graft Left 09/30/2012    Procedure: REDO LEFT FEMORAL-ANTERIOR TIBIAL ARTERY BYPASS USING COMPOSITE CEPHALIC AND BASILIC VEIN GRAFT FROM LEFT ARM;  Surgeon: Mal Misty, MD;  Location: [place];  Service: Vascular;  Laterality: Left;  . I&d extremity Left 10/21/2012    Procedure: EXPLORATION AND DEBRIDEMENT OF LEFT GROIN WOUND;  Surgeon: Mal Misty, MD;  Location: [place];  Service: Vascular;  Laterality: Left;  . Patch angioplasty Left 10/21/2012    Procedure: PATCH ANGIOPLASTY;  Surgeon: Mal Misty, MD;  Location: [place];  Service: Vascular;  Laterality: Left;  . Groin debridement Left 11/12/2012    Procedure: CLOSURE INGUINAL WOUND;  Surgeon: Mal Misty, MD;  Location: [place];  Service: Vascular;  Laterality: Left;  . Embolectomy Left 12/16/2012    Procedure: THROMBECTOMY  LEFT LEG BYPASS;  Surgeon: Elam Dutch, MD;  Location: [place];  Service: Vascular;  Laterality: Left;  . Leg amputation above knee Left 02/04/2013    DR LAWSON  . Amputation Left 02/04/2013    Procedure: AMPUTATION ABOVE KNEE-LEFT;  Surgeon: Mal Misty, MD;  Location: [place];  Service: Vascular;  Laterality: Left;  . Removal of graft Left 04/16/2013    Procedure: I & D LEFT AKA WOUND, POSSIBLE REMOVAL OF INFECTED GORTEX GRAFT;  Surgeon: Mal Misty, MD;  Location: [place];  Service: Vascular;  Laterality: Left;    There were no vitals taken for this visit.  Visit Diagnosis:  Abnormality of gait  Activity intolerance  Status post above knee amputation of left lower extremity          [facility] Adult PT Treatment/Exercise - 01/10/14 1224    Transfers   Transfers Sit to Stand;Stand to Sit   Sit to Stand 4: Min assist;With upper extremity assist;With armrests;From chair/3-in-1  min assist to stabilize   Stand to Sit 5: Supervision;With upper extremity assist;With armrests;To chair/3-in-1  cues on managing crutches or cane   Ambulation/Gait    Ambulation/Gait Yes   Ambulation/Gait Assistance 2: Max assist;3: Mod assist;4: Min guard   Ambulation/Gait Assistance Details min assist (crutches) mod A (cane locked knee) max A (cane unlocked knee)   Ambulation Distance (Feet) 125 Feet  125' with crutches, cane 15' knee unlocked & 15' knee unlock   Assistive device Crutches;Straight cane   Gait Pattern Step-to pattern;Decreased step length - right;Decreased stance time - left   Ramp 4: Min assist;Other (comment)  crutches with PT demo Roetta Sessions instruction   Curb 3: Mod assist;4: Min assist;Other (comment)  crutches mod  A to ascend min A to descend   Balance   Balance Assessed Yes   Dynamic Standing Balance   Dynamic Standing - Balance Support No upper extremity supported   Dynamic Standing - Level of Assistance 4: Min assist   Dynamic Standing - Balance Activities Reaching for objects   Dynamic Standing - Comments reaches 2" without UE support with min A          Education - 01/10/14 1239    Education provided Yes   Education Details educated on need for compliance with HEP to improve posture then balance & gait, may benefit from different prosthetic knee that does not require loading.   Education Details Patient;Other (comment)  nephew Elta [place] present   Methods Explanation   Comprehension Verbalized understanding          PT Short Term Goals - 01/10/14 0912    PT SHORT TERM GOAL #1   Title ambulates 150' with crutches & prosthesis with supervision.   Baseline Patient ambulated 125' with crutches & prosthesis with minimal gaurd assist.   Status Not Met   PT SHORT TERM GOAL #2   Title ambulate 36' with single point cane & prosthesis with supervision   Baseline Patient ambulated 53' with prosthetic knee unlocked with maximal assist / hand hold & 15' with prosthetic knee locked with moderate assist /hand hold.   Status Not Met   PT SHORT TERM GOAL #3   Title negotiate ramp & curb with crutches & prosthesis with minimal  assist   Baseline Patient negotiated ramp & curb with crutches with minimal assist for curb & moderate assist on ramp with constant cueing.   Status Not Met   PT SHORT TERM GOAL #4   Title reaches 5" without UE support safely with supervision.   Baseline reaches 2" without UE support with minimal assist.   Status Not Met          PT Long Term Goals - 01/10/14 1004    PT LONG TERM GOAL #1   Title Patient demonstrates & verbalizes proper prosthetic care to enable safe utilization of the prosthesis   Time --  02/04/14   Status On-going   PT LONG TERM GOAL #2   Title tolerates wear of prosthesis >90% of awake hours without change in skin integrity nor undue tenderness   Time --  02/04/14   Status On-going   PT LONG TERM GOAL #3   Title ambulates 200' with crutches & prosthesis modified independent   Time --  02/04/14   Status On-going   PT LONG TERM GOAL #4   Title ambulate 50' on uneven (grass) surfaces with crutches & prosthesis modified independent.   Time --  02/04/14   Status On-going   PT LONG TERM GOAL #5   Title negotiate ramp, curb & stairs (1 rail) with crutches & prosthesis modified independent.    Time --  02/04/14   Status On-going   Additional Long Term Goals   Additional Long Term Goals Yes   PT LONG TERM GOAL #6   Title perform standing activities for >10 minutes with prosthesis with pain /discomfort </= 2/10 increments on 0-10 scale   Time --  02/04/14   Status On-going   PT LONG TERM GOAL #7   Title Berg Balance Test score with prosthesis >/= 30/56   Time --  02/04/14   Status On-going   PT LONG TERM GOAL #8   Title self-report via FOTO improvement >10 points in Functional  Status Measure    Time --  02/04/14   Status On-going   PT LONG TERM GOAL  #9   TITLE ambulates 50' carrying cup or plate with cane & prosthesis modified independent   Time --  02/04/14   Status On-going          Plan - 01/10/14 1243    Clinical Impression Statement Patient may  benefit from different knee than microprocessor as he does not load prosthesis and knee can not operate without loading /pressure.    Pt will benefit from skilled therapeutic intervention in order to improve on the following deficits Abnormal gait;Decreased activity tolerance;Decreased balance;Decreased endurance;Decreased knowledge of use of DME;Decreased strength;Decreased range of motion;Pain;Other (comment)  prosthesis dependency   Rehab Potential Good   PT Frequency 2x / week   PT Duration --  02/04/14   PT Treatment/Interventions DME Instruction;Gait training;Stair training;Functional mobility training;Therapeutic exercise;Balance training;Neuromuscular re-education;Therapeutic activities;Patient/family education;Other (comment)  prosthetic training   PT Next Visit Plan gait with prosthesis with crutches, balance activities   PT Home Exercise Plan hip flexor stretches   Consulted and Agree with Plan of Care Patient        Problem List Patient Active Problem List   Diagnosis Date Noted  . Wound disruption, post-op, skin 05/28/2013  . Encounter for therapeutic drug monitoring 04/22/2013  . Wound infection 04/16/2013  . Chronic infection of amputation stump 04/13/2013  . Phantom limb pain 03/24/2013  . Wound drainage 03/23/2013  . Encounter for staple removal 03/09/2013  . Acute blood loss anemia 02/10/2013  . Chronic kidney disease 02/10/2013  . Unilateral AKA 02/09/2013  . Nontraumatic ischemic infarction of muscle of lower leg 02/04/2013  . Long term (current) use of anticoagulants 02/01/2013  . Pain, limb, left-Leg 01/21/2013  . Aftercare following surgery of the circulatory system, [place] 01/12/2013  . PVD (peripheral vascular disease) 12/22/2012  . Swelling of limb 10/07/2012  . Subclavian artery stenosis 09/08/2012  . Peripheral vascular disease, unspecified 08/18/2012  . Fever 07/08/2012  . Leg edema 06/02/2012  . Cardiomyopathy, ischemic 05/18/2012  . S/P ICD  (internal cardiac defibrillator) procedure 05/18/2012  . Diabetes 05/18/2012  . Pain in limb 02/07/2012  . Atherosclerosis of native arteries of the extremities with intermittent claudication 12/17/2011  . Atherosclerotic PVD with intermittent claudication 10/15/2011  . Type I (juvenile type) diabetes mellitus with peripheral circulatory disorders, uncontrolled 09/06/2011  . PAD (peripheral artery disease) 09/04/2011  . CAD (coronary artery disease)   . Burning sensation of feet 05/10/2011  . Bursitis of right hip 05/10/2011  . Cervicalgia 05/10/2011  . Balance disorder 05/10/2011  . Neurogenic claudication 05/10/2011  . Lumbago 05/10/2011  . Chronic systolic congestive heart failure 07/06/2010  . ECZEMA 01/17/2010  . RENAL INSUFFICIENCY 11/01/2009  . UTI 11/01/2009  . CELLULITIS, LEG, RIGHT 08/22/2009  . LYMPHADENOPATHY, REACTIVE 08/22/2009  . PERIPHERAL NEUROPATHY 01/24/2009  . DIZZINESS 12/29/2008  . ABDOMINAL PAIN, GENERALIZED 08/22/2008  . GOUT 09/18/2007  . CORONARY ARTERY DISEASE 09/18/2007  . HYPERLIPIDEMIA 01/23/2007  . HYPERTENSION 01/23/2007  . Atrial fibrillation 01/23/2007  . BENIGN PROSTATIC HYPERTROPHY 01/23/2007  . LATERAL EPICONDYLITIS 01/23/2007                                            Leidi Astle 01/10/2014, 12:54 PM

## 2014-01-11 ENCOUNTER — Ambulatory Visit: Payer: Medicare Other | Admitting: Family

## 2014-01-13 ENCOUNTER — Ambulatory Visit: Payer: Medicare Other | Admitting: Physical Therapy

## 2014-01-13 ENCOUNTER — Encounter: Payer: Self-pay | Admitting: Physical Therapy

## 2014-01-13 DIAGNOSIS — M79672 Pain in left foot: Secondary | ICD-10-CM

## 2014-01-13 DIAGNOSIS — Z89612 Acquired absence of left leg above knee: Secondary | ICD-10-CM

## 2014-01-13 DIAGNOSIS — I999 Unspecified disorder of circulatory system: Secondary | ICD-10-CM

## 2014-01-13 DIAGNOSIS — R269 Unspecified abnormalities of gait and mobility: Secondary | ICD-10-CM

## 2014-01-13 DIAGNOSIS — R6889 Other general symptoms and signs: Secondary | ICD-10-CM

## 2014-01-13 NOTE — Therapy (Signed)
Physical Therapy Treatment  Patient Details  Name: [name] MRN: [id number] Date of Birth: 1934-04-19  Encounter Date: 01/13/2014      PT End of Session - 01/13/14 1156    Visit Number [id number]   Number of Visits 44   Date for PT Re-Evaluation 02/04/14   PT Start Time 1103   PT Stop Time 1145   PT Time Calculation (min) 42 min   Equipment Utilized During Treatment Gait belt  prosthesis   Activity Tolerance Patient tolerated treatment well      Past Medical History  Diagnosis Date  . Gout   . Benign prostatic hypertrophy     takes Proscar daily  . Atrial fibrillation     takes Warfarin daily  . Chronic systolic dysfunction of left ventricle     a. mixed ischemic and nonischemic CM,  EF 35%. b. s/p AICD implantation.  . ED (erectile dysfunction)   . Arthritis   . Pacemaker     medtronic  . CAD (coronary artery disease)     a. s/p mid LAD stenting with DES 2008 Dr. Daneen Schick  . ICD (implantable cardiac defibrillator) in place     medtronic, Dr. Rayann Heman  . ICD (implantable cardiac defibrillator) in place   . Sleep apnea     hx of "had surgery for"  . Automatic implantable cardioverter-defibrillator in situ   . Depression   . Numbness and tingling     Hx; 35f left foot  . PAD (peripheral artery disease)     Severe by PV angiogram 09/2011  . Renal artery stenosis     s/p stenting 2009  . PAD (peripheral artery disease)     s/p multiple LLE bypass grafts; left mid-distal SCA occlusion by 08/2012 duplex  . Hypertension     takes Carvedilol and Losartan daily  . Constipation     takes Miralax daily as needed and Colace daily   . Type II diabetes mellitus     takes Novolog 70/30  . CHF (congestive heart failure)     takes Lasix daily  . History of MRSA infection   . Insomnia     TAKES TRAZODONE NIGHTLY    Past Surgical History  Procedure Laterality Date  . Turp vaporization    . Cardiac defibrillator placement  12/26/09    pacemaker combo  . Cervical  epidural injection  2013  . Femoral-tibial bypass graft  09/25/2011    Procedure: BYPASS GRAFT FEMORAL-TIBIAL ARTERY;  Surgeon: Mal Misty, MD;  Location: [facility] OR;  Service: Vascular;  Laterality: Left;  Left Femoral - Anterior Tibial Bypass;  saphenous vein graft from left leg  . Intraoperative arteriogram  09/25/2011    Procedure: INTRA OPERATIVE ARTERIOGRAM;  Surgeon: Mal Misty, MD;  Location: [place];  Service: Vascular;  Laterality: Left;  . Femoral-tibial bypass graft  02/07/2012    Procedure: BYPASS GRAFT FEMORAL-TIBIAL ARTERY;  Surgeon: Rosetta Posner, MD;  Location: [place];  Service: Vascular;  Laterality: Left;  Thrombectomy Left Femoral - Tibial Bypass Graft  . Embolectomy  02/07/2012    Procedure: EMBOLECTOMY;  Surgeon: Rosetta Posner, MD;  Location: [place];  Service: Vascular;  Laterality: Left;  . Femoral-tibial bypass graft  04/03/2012    Procedure: BYPASS GRAFT FEMORAL-TIBIAL ARTERY;  Surgeon: Mal Misty, MD;  Location: [place];  Service: Vascular;  Laterality: Left;  Redo  . Insert / replace / remove pacemaker  2007  . Coronary angioplasty with stent placement  ~  2000  . Coronary angioplasty    . Uvulopalatopharyngoplasty (uppp)/tonsillectomy/septoplasty  06/30/2003    Archie Endo 06/30/2003 (07/10/2012)  . Shoulder open rotator cuff repair Right 2001    repair of lacerated right/notes 10/11/1999  (07/10/2012)  . Foot surgery Right 03/20/2001    "have plates and screws in; didn't break it" (07/10/2012)  . Carpal tunnel release Right 2002    Archie Endo 03/20/2001 (07/10/2012)  . Biceps tendon repair Right 2001    Archie Endo 03/20/2001 (07/10/2012)  . Renal artery stent  2009  . Femoral-popliteal bypass graft Left 09/16/2012    Procedure: LEFT FEMORAL-POPLITEAL BYPASS GRAFT WITH GORTEX Propaten Graft 6x80 Thin Wall and Left lower leg Angiogram;  Surgeon: Mal Misty, MD;  Location: [place];  Service: Vascular;  Laterality: Left;  . Colonoscopy      Hx; of  . Tonsillectomy    . Adenoidectomy      Hx; of    . Femoral-tibial bypass graft Left 09/30/2012    Procedure: REDO LEFT FEMORAL-ANTERIOR TIBIAL ARTERY BYPASS USING COMPOSITE CEPHALIC AND BASILIC VEIN GRAFT FROM LEFT ARM;  Surgeon: Mal Misty, MD;  Location: [place];  Service: Vascular;  Laterality: Left;  . I&d extremity Left 10/21/2012    Procedure: EXPLORATION AND DEBRIDEMENT OF LEFT GROIN WOUND;  Surgeon: Mal Misty, MD;  Location: [place];  Service: Vascular;  Laterality: Left;  . Patch angioplasty Left 10/21/2012    Procedure: PATCH ANGIOPLASTY;  Surgeon: Mal Misty, MD;  Location: [place];  Service: Vascular;  Laterality: Left;  . Groin debridement Left 11/12/2012    Procedure: CLOSURE INGUINAL WOUND;  Surgeon: Mal Misty, MD;  Location: [place];  Service: Vascular;  Laterality: Left;  . Embolectomy Left 12/16/2012    Procedure: THROMBECTOMY  LEFT LEG BYPASS;  Surgeon: Elam Dutch, MD;  Location: [place];  Service: Vascular;  Laterality: Left;  . Leg amputation above knee Left 02/04/2013    DR LAWSON  . Amputation Left 02/04/2013    Procedure: AMPUTATION ABOVE KNEE-LEFT;  Surgeon: Mal Misty, MD;  Location: [place];  Service: Vascular;  Laterality: Left;  . Removal of graft Left 04/16/2013    Procedure: I & D LEFT AKA WOUND, POSSIBLE REMOVAL OF INFECTED GORTEX GRAFT;  Surgeon: Mal Misty, MD;  Location: [place];  Service: Vascular;  Laterality: Left;    There were no vitals taken for this visit.  Visit Diagnosis:  Abnormality of gait  Activity intolerance  Status post above knee amputation of left lower extremity  Ischemic pain of foot, left      Subjective Assessment - 01/13/14 1109    Symptoms Fininshed antibiotice for UTI.  Now with increased pain in            [facility] Adult PT Treatment/Exercise - 01/13/14 1112    Transfers   Sit to Stand 5: Supervision;With upper extremity assist;With armrests;From chair/3-in-1   Stand to Sit 5: Supervision;With upper extremity assist;With armrests;To chair/3-in-1    Ambulation/Gait   Ambulation/Gait Yes   Ambulation/Gait Assistance 5: Supervision;4: Min guard   Ambulation/Gait Assistance Details cues on posture, to increase stride length to progress to step throught gait pattern, to increase prosthetic knee used with swing phase (to flex with swing phase vs circumducting with knee extension).                       Ambulation Distance (Feet) 120 Feet  plus: 150' with ramp/curb included, 7' x1   Assistive  device Rolling walker   Ramp 5: Supervision   Ramp Details (indicate cue type and reason) cues to stay with walker and for correct sequence with ascending ramp: all with RW/prosthesis    Curb 5: Supervision   Curb Details (indicate cue type and reason) needed cues on sequence with ascending only, demo'd safe technique with descending curb: all with RW/prosthesis   Balance   Balance Assessed Yes   Static Standing Balance   Static Standing - Balance Support No upper extremity supported;During functional activity   Static Standing - Level of Assistance 4: Min assist;5: Stand by assistance  min guard assist   Static Standing - Comment/# of Minutes standing without UE support with prosthetic knee locked: bean bag toss with bags at varied heights and directions for pt to reach and retrieve the bags both to the left and right side.                                Prosthetics   Prosthetic Care Comments  pt able to verbalize care/cleaning of prosthesis and liner. Pt able to verbalize correct wear time and sock managment, needs cues to follow through.                      Current prosthetic wear tolerance (days/week)  7   Current prosthetic wear tolerance (#hours/day)  6-8 hours total per day   Residual limb condition  intact per patient   Donning Prosthesis Independent   Doffing Prosthesis Independent              PT Long Term Goals - 01/13/14 1202    PT LONG TERM GOAL #1   Title Patient demonstrates & verbalizes proper prosthetic care to enable safe  utilization of the prosthesis. 02/04/2014   Status On-going   PT LONG TERM GOAL #2   Title tolerates wear of prosthesis >90% of awake hours without change in skin integrity nor undue tenderness. 02/04/2014   Status On-going   PT LONG TERM GOAL #3   Title ambulates 200' with rolling walker & prosthesis modified independent. 02/04/2014   Status Revised   PT LONG TERM GOAL #4   Title ambulate 50' on uneven (grass) surfaces with rolling walker & prosthesis modified independent. 12/04/205   Status Revised   PT LONG TERM GOAL #5   Title negotiate ramp, curb & stairs (1 rail) with rolling walker & prosthesis modified independent. 02/04/2014   Status Revised   PT LONG TERM GOAL #6   Title perform standing activities for >10 minutes with prosthesis with pain /discomfort </= 2/10 increments on 0-10 scale. 02/04/2014   Status On-going   PT LONG TERM GOAL #7   Title Berg Balance Test score with prosthesis >/= 30/56. 02/04/2014   Status On-going   PT LONG TERM GOAL #8   Title self-report via FOTO improvement >10 points in Functional Status Measure. 02/04/2014   Status On-going   PT LONG TERM GOAL  #9   TITLE ambulates 50' carrying cup or plate with cane & prosthesis modified independent. 02/04/2014   Status Deferred          Plan - 01/13/14 1157    Clinical Impression Statement Pt making steady progress with use of walker today. Plan and goals discussed with Jamey Reas, PT, DPT prior to session and changes to current plan and goals made by PT. Those changes are being made in this note with  PT approval and to be cosigned by PT. Discussed with pt the plan to progress and advance with RW now vs with cane/crutches and to discharge at the end of the current plan of care. Pt agreeable with this plan/change.                                   Pt will benefit from skilled therapeutic intervention in order to improve on the following deficits Abnormal gait;Decreased activity tolerance;Decreased  balance;Decreased endurance;Decreased knowledge of use of DME;Decreased strength;Decreased range of motion;Pain;Other (comment)  prosthesis dependency   Rehab Potential Good   PT Frequency 2x / week   PT Duration 8 weeks   PT Treatment/Interventions DME Instruction;Gait training;Stair training;Functional mobility training;Therapeutic exercise;Balance training;Neuromuscular re-education;Therapeutic activities;Patient/family education;Other (comment)  prosthetic training   PT Next Visit Plan continue with walker and balance activites toward goals        Problem List Patient Active Problem List   Diagnosis Date Noted  . Wound disruption, post-op, skin 05/28/2013  . Encounter for therapeutic drug monitoring 04/22/2013  . Wound infection 04/16/2013  . Chronic infection of amputation stump 04/13/2013  . Phantom limb pain 03/24/2013  . Wound drainage 03/23/2013  . Encounter for staple removal 03/09/2013  . Acute blood loss anemia 02/10/2013  . Chronic kidney disease 02/10/2013  . Unilateral AKA 02/09/2013  . Nontraumatic ischemic infarction of muscle of lower leg 02/04/2013  . Long term (current) use of anticoagulants 02/01/2013  . Pain, limb, left-Leg 01/21/2013  . Aftercare following surgery of the circulatory system, [place] 01/12/2013  . PVD (peripheral vascular disease) 12/22/2012  . Swelling of limb 10/07/2012  . Subclavian artery stenosis 09/08/2012  . Peripheral vascular disease, unspecified 08/18/2012  . Fever 07/08/2012  . Leg edema 06/02/2012  . Cardiomyopathy, ischemic 05/18/2012  . S/P ICD (internal cardiac defibrillator) procedure 05/18/2012  . Diabetes 05/18/2012  . Pain in limb 02/07/2012  . Atherosclerosis of native arteries of the extremities with intermittent claudication 12/17/2011  . Atherosclerotic PVD with intermittent claudication 10/15/2011  . Type I (juvenile type) diabetes mellitus with peripheral circulatory disorders, uncontrolled 09/06/2011  . PAD  (peripheral artery disease) 09/04/2011  . CAD (coronary artery disease)   . Burning sensation of feet 05/10/2011  . Bursitis of right hip 05/10/2011  . Cervicalgia 05/10/2011  . Balance disorder 05/10/2011  . Neurogenic claudication 05/10/2011  . Lumbago 05/10/2011  . Chronic systolic congestive heart failure 07/06/2010  . ECZEMA 01/17/2010  . RENAL INSUFFICIENCY 11/01/2009  . UTI 11/01/2009  . CELLULITIS, LEG, RIGHT 08/22/2009  . LYMPHADENOPATHY, REACTIVE 08/22/2009  . PERIPHERAL NEUROPATHY 01/24/2009  . DIZZINESS 12/29/2008  . ABDOMINAL PAIN, GENERALIZED 08/22/2008  . GOUT 09/18/2007  . CORONARY ARTERY DISEASE 09/18/2007  . HYPERLIPIDEMIA 01/23/2007  . HYPERTENSION 01/23/2007  . Atrial fibrillation 01/23/2007  . BENIGN PROSTATIC HYPERTROPHY 01/23/2007  . LATERAL EPICONDYLITIS 01/23/2007          Willow Ora 01/13/2014, 12:06 PM  Willow Ora, PTA Outpatient Neuro [facility] [address] [place], [place] [address] [phone]

## 2014-01-17 ENCOUNTER — Encounter: Payer: Self-pay | Admitting: Physical Medicine & Rehabilitation

## 2014-01-17 ENCOUNTER — Encounter: Payer: Medicare Other | Attending: Physical Medicine & Rehabilitation | Admitting: Physical Medicine & Rehabilitation

## 2014-01-17 ENCOUNTER — Other Ambulatory Visit: Payer: Self-pay | Admitting: Physical Medicine & Rehabilitation

## 2014-01-17 VITALS — BP 154/63 | HR 64 | Resp 16 | Ht 68.0 in | Wt 182.0 lb

## 2014-01-17 DIAGNOSIS — M79672 Pain in left foot: Secondary | ICD-10-CM | POA: Diagnosis present

## 2014-01-17 DIAGNOSIS — Z79899 Other long term (current) drug therapy: Secondary | ICD-10-CM

## 2014-01-17 DIAGNOSIS — M10031 Idiopathic gout, right wrist: Secondary | ICD-10-CM

## 2014-01-17 DIAGNOSIS — Z89612 Acquired absence of left leg above knee: Secondary | ICD-10-CM

## 2014-01-17 DIAGNOSIS — Z5181 Encounter for therapeutic drug level monitoring: Secondary | ICD-10-CM

## 2014-01-17 DIAGNOSIS — G546 Phantom limb syndrome with pain: Secondary | ICD-10-CM

## 2014-01-17 DIAGNOSIS — S78112A Complete traumatic amputation at level between left hip and knee, initial encounter: Secondary | ICD-10-CM

## 2014-01-17 MED ORDER — COLCHICINE 0.6 MG PO TABS: 0.6000 mg | ORAL_TABLET | Freq: Two times a day (BID) | ORAL | Status: DC | PRN

## 2014-01-17 MED ORDER — OXYCODONE-ACETAMINOPHEN 7.5-325 MG PO TABS: 1.0000 | ORAL_TABLET | Freq: Four times a day (QID) | ORAL | Status: DC | PRN

## 2014-01-17 MED ORDER — CARBAMAZEPINE 200 MG PO TABS: 200.0000 mg | ORAL_TABLET | Freq: Two times a day (BID) | ORAL | Status: DC

## 2014-01-17 NOTE — Patient Instructions (Signed)
PLEASE CALL ME WITH ANY PROBLEMS OR QUESTIONS (#[phone]).      

## 2014-01-17 NOTE — Progress Notes (Signed)
Subjective:    Patient ID: [name], male    DOB: 06-Jun-1934, 79 y.o.   MRN: [id number]  HPI   [name] is back regarding his chronic pain. He hasn't noticed a big difference with the changes we made, but for the most part it's been controlled up until recently. He feels that the percocet does help somewhat.  He is having increased pain in both of his wrists/thumbs. He feels that it's a gout flare.   He continues to work with PT on his gait trianing but is a little frustrated with progress.   Pain Inventory Average Pain 7 Pain Right Now 8 My pain is sharp, stabbing and tingling  In the last 24 hours, has pain interfered with the following? General activity 8 Relation with others 8 Enjoyment of life 5 What TIME of day is your pain at its worst? night Sleep (in general) Poor  Pain is worse with: standing and some activites Pain improves with: medication Relief from Meds: 8  Mobility use a walker ability to climb steps?  yes do you drive?  yes transfers alone Do you have any goals in this area?  yes  Function retired  Neuro/Psych trouble walking  Prior Studies Any changes since last visit?  no  Physicians involved in your care Any changes since last visit?  no   Family History  Problem Relation Age of Onset  . Cancer      breast/fhx  . Heart disease      fhx  . Diabetes Neg Hx   . Cancer Father    History   Social History  . Marital Status: Widowed    Spouse Name: N/A    Number of Children: N/A  . Years of Education: N/A   Occupational History  . Retired    Social History Main Topics  . Smoking status: Never Smoker   . Smokeless tobacco: Never Used     Comment: 1-2 cigars when golfing  . Alcohol Use: 2.4 oz/week    2 Glasses of wine, 2 Shots of liquor per week     Comment: 07/10/2012 "glass of wine or vodka tonic 3 X/wk"  . Drug Use: No  . Sexual Activity: Not Currently   Other Topics Concern  . None   Social History Narrative     Lives in [place] with significant other,  [name].   Past Surgical History  Procedure Laterality Date  . Turp vaporization    . Cardiac defibrillator placement  12/26/09    pacemaker combo  . Cervical epidural injection  2013  . Femoral-tibial bypass graft  09/25/2011    Procedure: BYPASS GRAFT FEMORAL-TIBIAL ARTERY;  Surgeon: Mal Misty, MD;  Location: [facility] OR;  Service: Vascular;  Laterality: Left;  Left Femoral - Anterior Tibial Bypass;  saphenous vein graft from left leg  . Intraoperative arteriogram  09/25/2011    Procedure: INTRA OPERATIVE ARTERIOGRAM;  Surgeon: Mal Misty, MD;  Location: [place];  Service: Vascular;  Laterality: Left;  . Femoral-tibial bypass graft  02/07/2012    Procedure: BYPASS GRAFT FEMORAL-TIBIAL ARTERY;  Surgeon: Rosetta Posner, MD;  Location: [place];  Service: Vascular;  Laterality: Left;  Thrombectomy Left Femoral - Tibial Bypass Graft  . Embolectomy  02/07/2012    Procedure: EMBOLECTOMY;  Surgeon: Rosetta Posner, MD;  Location: [place];  Service: Vascular;  Laterality: Left;  . Femoral-tibial bypass graft  04/03/2012    Procedure: BYPASS GRAFT FEMORAL-TIBIAL ARTERY;  Surgeon: Nelda Severe  Kellie Simmering, MD;  Location: [facility] OR;  Service: Vascular;  Laterality: Left;  Redo  . Insert / replace / remove pacemaker  2007  . Coronary angioplasty with stent placement  ~ 2000  . Coronary angioplasty    . Uvulopalatopharyngoplasty (uppp)/tonsillectomy/septoplasty  06/30/2003    Archie Endo 06/30/2003 (07/10/2012)  . Shoulder open rotator cuff repair Right 2001    repair of lacerated right/notes 10/11/1999  (07/10/2012)  . Foot surgery Right 03/20/2001    "have plates and screws in; didn't break it" (07/10/2012)  . Carpal tunnel release Right 2002    Archie Endo 03/20/2001 (07/10/2012)  . Biceps tendon repair Right 2001    Archie Endo 03/20/2001 (07/10/2012)  . Renal artery stent  2009  . Femoral-popliteal bypass graft Left 09/16/2012    Procedure: LEFT FEMORAL-POPLITEAL BYPASS GRAFT WITH GORTEX Propaten Graft  6x80 Thin Wall and Left lower leg Angiogram;  Surgeon: Mal Misty, MD;  Location: [place];  Service: Vascular;  Laterality: Left;  . Colonoscopy      Hx; of  . Tonsillectomy    . Adenoidectomy      Hx; of   . Femoral-tibial bypass graft Left 09/30/2012    Procedure: REDO LEFT FEMORAL-ANTERIOR TIBIAL ARTERY BYPASS USING COMPOSITE CEPHALIC AND BASILIC VEIN GRAFT FROM LEFT ARM;  Surgeon: Mal Misty, MD;  Location: [place];  Service: Vascular;  Laterality: Left;  . I&d extremity Left 10/21/2012    Procedure: EXPLORATION AND DEBRIDEMENT OF LEFT GROIN WOUND;  Surgeon: Mal Misty, MD;  Location: [place];  Service: Vascular;  Laterality: Left;  . Patch angioplasty Left 10/21/2012    Procedure: PATCH ANGIOPLASTY;  Surgeon: Mal Misty, MD;  Location: [place];  Service: Vascular;  Laterality: Left;  . Groin debridement Left 11/12/2012    Procedure: CLOSURE INGUINAL WOUND;  Surgeon: Mal Misty, MD;  Location: [place];  Service: Vascular;  Laterality: Left;  . Embolectomy Left 12/16/2012    Procedure: THROMBECTOMY  LEFT LEG BYPASS;  Surgeon: Elam Dutch, MD;  Location: [place];  Service: Vascular;  Laterality: Left;  . Leg amputation above knee Left 02/04/2013    DR LAWSON  . Amputation Left 02/04/2013    Procedure: AMPUTATION ABOVE KNEE-LEFT;  Surgeon: Mal Misty, MD;  Location: [place];  Service: Vascular;  Laterality: Left;  . Removal of graft Left 04/16/2013    Procedure: I & D LEFT AKA WOUND, POSSIBLE REMOVAL OF INFECTED GORTEX GRAFT;  Surgeon: Mal Misty, MD;  Location: [place];  Service: Vascular;  Laterality: Left;   Past Medical History  Diagnosis Date  . Gout   . Benign prostatic hypertrophy     takes Proscar daily  . Atrial fibrillation     takes Warfarin daily  . Chronic systolic dysfunction of left ventricle     a. mixed ischemic and nonischemic CM,  EF 35%. b. s/p AICD implantation.  . ED (erectile dysfunction)   . Arthritis   . Pacemaker     medtronic  . CAD (coronary  artery disease)     a. s/p mid LAD stenting with DES 2008 Dr. Daneen Schick  . ICD (implantable cardiac defibrillator) in place     medtronic, Dr. Rayann Heman  . ICD (implantable cardiac defibrillator) in place   . Sleep apnea     hx of "had surgery for"  . Automatic implantable cardioverter-defibrillator in situ   . Depression   . Numbness and tingling     Hx; 46f left foot  . PAD (  peripheral artery disease)     Severe by PV angiogram 09/2011  . Renal artery stenosis     s/p stenting 2009  . PAD (peripheral artery disease)     s/p multiple LLE bypass grafts; left mid-distal SCA occlusion by 08/2012 duplex  . Hypertension     takes Carvedilol and Losartan daily  . Constipation     takes Miralax daily as needed and Colace daily   . Type II diabetes mellitus     takes Novolog 70/30  . CHF (congestive heart failure)     takes Lasix daily  . History of MRSA infection   . Insomnia     TAKES TRAZODONE NIGHTLY   BP 154/63 mmHg  Pulse 64  Resp 16  Ht 5\' 8"  (1.727 m)  Wt 182 lb (82.555 kg)  BMI 27.68 kg/m2  SpO2 98%  Opioid Risk Score:   Fall Risk Score: Moderate Fall Risk (6-13 points) Review of Systems  Respiratory: Positive for apnea.   Cardiovascular: Positive for leg swelling.  All other systems reviewed and are negative.      Objective:   Physical Exam  Constitutional: He is oriented to person, place, and time. He appears well-developed and well-nourished.  HENT:  Head: Normocephalic and atraumatic.  Eyes: Conjunctivae are normal. Pupils are equal, round, and reactive to light.  Neck: Neck supple.  Cardiovascular: Normal rate and regular rhythm. No rubs, gallops. Pacer site dressed  No murmur heard.  Respiratory: Effort normal and breath sounds normal. No respiratory distress. He has no wheezes.  GI: Soft. Bowel sounds are normal. He exhibits no distension. There is no tenderness.  Musculoskeletal:  L-AKA prosthesis still in place and fitting appropriately. Mild warmth  and erythema over right and left MCP's. Tender to palpation also. Neurological: He is alert and oriented to person, place, and time.  Follows commands. Strength grossly intact in UE's at 5/5. RLE is 4/5 prox to 5/5 distally. Left HF 4/5. mild sensory loss in distal right leg/foot.   Skin: Intact.  Psychiatric: He has a normal mood and affect. His speech is normal and behavior is normal. Judgment and thought content normal. Cognition and memory are normal.     Assessment/Plan:  1. Functional deficits secondary to left AKA. Continue gait training  2. Pain Management: cannot tolerate increased gabapentin  -increase tegretol 200mg  bid  -increase percocet to 7.5mg   -Consider long acting opiate  -again discussed again aggressive massage and visual feed back therapy  -reviewed using topical agents/modalities to thigh to help further with pain at night  -prosthesis continues to fit appropriately.  3. Follow up with me in about 2 months . 15 minutes of face to face patient care time were spent during this visit. All questions were encouraged and answered.  4. Gout---resumed allopurinol  -give some colchicine for prn breakthrough

## 2014-01-17 NOTE — Addendum Note (Signed)
Addended by: Geryl Rankins D on: 01/17/2014 10:51 AM   Modules accepted: Orders

## 2014-01-18 ENCOUNTER — Ambulatory Visit: Payer: Medicare Other | Admitting: Physical Therapy

## 2014-01-18 ENCOUNTER — Telehealth: Payer: Self-pay | Admitting: *Deleted

## 2014-01-18 ENCOUNTER — Encounter: Payer: Self-pay | Admitting: Physical Therapy

## 2014-01-18 ENCOUNTER — Other Ambulatory Visit: Payer: Self-pay | Admitting: *Deleted

## 2014-01-18 DIAGNOSIS — Z89612 Acquired absence of left leg above knee: Secondary | ICD-10-CM

## 2014-01-18 DIAGNOSIS — R269 Unspecified abnormalities of gait and mobility: Secondary | ICD-10-CM

## 2014-01-18 DIAGNOSIS — R6889 Other general symptoms and signs: Secondary | ICD-10-CM

## 2014-01-18 LAB — PMP ALCOHOL METABOLITE (ETG)

## 2014-01-18 NOTE — Therapy (Deleted)
Physical Therapy Treatment  Patient Details  Name: [name] MRN: [id number] Date of Birth: [birth year]  Encounter Date: 01/18/2014      PT End of Session - 01/18/14 0956    Visit Number 45   Number of Visits 44   Date for PT Re-Evaluation 02/04/14   PT Start Time 0847   PT Stop Time 0931   PT Time Calculation (min) 44 min   Equipment Utilized During Treatment Gait belt   Activity Tolerance Patient tolerated treatment well;Patient limited by fatigue   Behavior During Therapy [facility] for tasks assessed/performed      Past Medical History  Diagnosis Date  . Gout   . Benign prostatic hypertrophy     takes Proscar daily  . Atrial fibrillation     takes Warfarin daily  . Chronic systolic dysfunction of left ventricle     a. mixed ischemic and nonischemic CM,  EF 35%. b. s/p AICD implantation.  . ED (erectile dysfunction)   . Arthritis   . Pacemaker     medtronic  . CAD (coronary artery disease)     a. s/p mid LAD stenting with DES 2008 Dr. Daneen Schick  . ICD (implantable cardiac defibrillator) in place     medtronic, Dr. Rayann Heman  . ICD (implantable cardiac defibrillator) in place   . Sleep apnea     hx of "had surgery for"  . Automatic implantable cardioverter-defibrillator in situ   . Depression   . Numbness and tingling     Hx; 58f left foot  . PAD (peripheral artery disease)     Severe by PV angiogram 09/2011  . Renal artery stenosis     s/p stenting 2009  . PAD (peripheral artery disease)     s/p multiple LLE bypass grafts; left mid-distal SCA occlusion by 08/2012 duplex  . Hypertension     takes Carvedilol and Losartan daily  . Constipation     takes Miralax daily as needed and Colace daily   . Type II diabetes mellitus     takes Novolog 70/30  . CHF (congestive heart failure)     takes Lasix daily  . History of MRSA infection   . Insomnia     TAKES TRAZODONE NIGHTLY    Past Surgical History  Procedure Laterality Date  . Turp vaporization    . Cardiac  defibrillator placement  12/26/09    pacemaker combo  . Cervical epidural injection  2013  . Femoral-tibial bypass graft  09/25/2011    Procedure: BYPASS GRAFT FEMORAL-TIBIAL ARTERY;  Surgeon: Mal Misty, MD;  Location: [facility];  Service: Vascular;  Laterality: Left;  Left Femoral - Anterior Tibial Bypass;  saphenous vein graft from left leg  . Intraoperative arteriogram  09/25/2011    Procedure: INTRA OPERATIVE ARTERIOGRAM;  Surgeon: Mal Misty, MD;  Location: [place];  Service: Vascular;  Laterality: Left;  . Femoral-tibial bypass graft  02/07/2012    Procedure: BYPASS GRAFT FEMORAL-TIBIAL ARTERY;  Surgeon: Rosetta Posner, MD;  Location: [place];  Service: Vascular;  Laterality: Left;  Thrombectomy Left Femoral - Tibial Bypass Graft  . Embolectomy  02/07/2012    Procedure: EMBOLECTOMY;  Surgeon: Rosetta Posner, MD;  Location: [place];  Service: Vascular;  Laterality: Left;  . Femoral-tibial bypass graft  04/03/2012    Procedure: BYPASS GRAFT FEMORAL-TIBIAL ARTERY;  Surgeon: Mal Misty, MD;  Location: [place];  Service: Vascular;  Laterality: Left;  Redo  . Insert / replace / remove pacemaker  2007  . Coronary angioplasty with stent placement  ~ 2000  . Coronary angioplasty    . Uvulopalatopharyngoplasty (uppp)/tonsillectomy/septoplasty  06/30/2003    Archie Endo 06/30/2003 (07/10/2012)  . Shoulder open rotator cuff repair Right 2001    repair of lacerated right/notes 10/11/1999  (07/10/2012)  . Foot surgery Right 03/20/2001    "have plates and screws in; didn't break it" (07/10/2012)  . Carpal tunnel release Right 2002    Archie Endo 03/20/2001 (07/10/2012)  . Biceps tendon repair Right 2001    Archie Endo 03/20/2001 (07/10/2012)  . Renal artery stent  2009  . Femoral-popliteal bypass graft Left 09/16/2012    Procedure: LEFT FEMORAL-POPLITEAL BYPASS GRAFT WITH GORTEX Propaten Graft 6x80 Thin Wall and Left lower leg Angiogram;  Surgeon: Mal Misty, MD;  Location: [place];  Service: Vascular;  Laterality: Left;  .  Colonoscopy      Hx; of  . Tonsillectomy    . Adenoidectomy      Hx; of   . Femoral-tibial bypass graft Left 09/30/2012    Procedure: REDO LEFT FEMORAL-ANTERIOR TIBIAL ARTERY BYPASS USING COMPOSITE CEPHALIC AND BASILIC VEIN GRAFT FROM LEFT ARM;  Surgeon: Mal Misty, MD;  Location: [place];  Service: Vascular;  Laterality: Left;  . I&d extremity Left 10/21/2012    Procedure: EXPLORATION AND DEBRIDEMENT OF LEFT GROIN WOUND;  Surgeon: Mal Misty, MD;  Location: [place];  Service: Vascular;  Laterality: Left;  . Patch angioplasty Left 10/21/2012    Procedure: PATCH ANGIOPLASTY;  Surgeon: Mal Misty, MD;  Location: [place];  Service: Vascular;  Laterality: Left;  . Groin debridement Left 11/12/2012    Procedure: CLOSURE INGUINAL WOUND;  Surgeon: Mal Misty, MD;  Location: [place];  Service: Vascular;  Laterality: Left;  . Embolectomy Left 12/16/2012    Procedure: THROMBECTOMY  LEFT LEG BYPASS;  Surgeon: Elam Dutch, MD;  Location: [place];  Service: Vascular;  Laterality: Left;  . Leg amputation above knee Left 02/04/2013    DR LAWSON  . Amputation Left 02/04/2013    Procedure: AMPUTATION ABOVE KNEE-LEFT;  Surgeon: Mal Misty, MD;  Location: [place];  Service: Vascular;  Laterality: Left;  . Removal of graft Left 04/16/2013    Procedure: I & D LEFT AKA WOUND, POSSIBLE REMOVAL OF INFECTED GORTEX GRAFT;  Surgeon: Mal Misty, MD;  Location: [place];  Service: Vascular;  Laterality: Left;    There were no vitals taken for this visit.  Visit Diagnosis:  Abnormality of gait  Activity intolerance  Status post above knee amputation of left lower extremity      Subjective Assessment - 01/18/14 0859    Symptoms Patient has pain in bilateral thumbs due to gout.    Currently in Pain? Yes   Pain Score 9    Pain Location Other (Comment)  Bilateral thumb   Pain Orientation Right;Left   Pain Descriptors / Indicators Burning;Aching   Pain Type Acute pain   Pain Onset 1 to 4 weeks ago    Pain Frequency Constant   Pain Relieving Factors Ice            [facility] Adult PT Treatment/Exercise - 01/18/14 0900    Transfers   Transfers Sit to Stand;Stand to Sit   Sit to Stand 5: Supervision;With upper extremity assist;With armrests;From chair/3-in-1   Sit to Stand Details (indicate cue type and reason) no cueing required   Stand to Sit 5: Supervision;With upper extremity assist;To chair/3-in-1;With armrests   Stand to Sit  Details cues when standing to lock prosthetic knee out to allow weight bearing without buckling   Ambulation/Gait   Ambulation/Gait Yes   Ambulation/Gait Assistance 5: Supervision   Ambulation/Gait Assistance Details gait training in parallel bars with tactile cues on weight shift on to stance leg, verbal cues on posture and step length with mirror available for patient to observe performance. Instructed to heel strike at initial contact, focus on foot placement equal distance from midline. Pt ambulated with foam grips and coban on RW to increase padding and decrease stress on gout in bilateral thumbs.   Ambulation Distance (Feet) 160 Feet   Assistive device Rolling walker;Other (Comment)  above knee prosthesis (L)   Gait Pattern Step-through pattern;Decreased step length - right;Decreased stance time - left;Trunk flexed  abducted L leg   Gait velocity decreased   Exercises   Exercises --   Prosthetics   Prosthetic Care Comments  patient cued on tightening prosthesis during following toileting, increasing wear time of prosthesis, donning prosthesis when waking prior to breakfast. Instructed to increase wear to 4-5 hours 2x/day   Current prosthetic wear tolerance (days/week)  7   Current prosthetic wear tolerance (#hours/day)  6-9   Residual limb condition  per patient, in tact          PT Education - 01/18/14 0955    Education provided Yes   Education Details Increase activity at home to increase endurance. Wearing prosthesis during ADL's to increase wear  tolerance and activity tolerance. Foam padding and coban provided to patient for personal RW grips to decrease force on thumbs and allow for increased pain free mobility.   Person(s) Educated Patient   Methods Explanation   Comprehension Verbalized understanding;Need further instruction              Plan - 01/18/14 0957    Clinical Impression Statement Pt stated foam grips and coban allowed for ambulation without increase in pain. Pt demonstrates increased ambulation distance when ambulating with RW but still demonstrates gait deviations and significant use of BUE on RW causing decreased activity tolerance. Pt shows improved gait quality during gait training in parallel bars but need reinforcement for carryover to home. Increased use of BUE when walking is due to decreased confidence in single limb stance on prosthetic limb.    Pt will benefit from skilled therapeutic intervention in order to improve on the following deficits Abnormal gait;Decreased activity tolerance;Decreased balance;Decreased endurance;Decreased knowledge of use of DME;Decreased strength;Decreased range of motion;Pain;Difficulty walking;Decreased mobility   Rehab Potential Good   Clinical Impairments Affecting Rehab Potential Fear of fallling   PT Frequency 2x / week   PT Duration 8 weeks   PT Treatment/Interventions DME Instruction;Gait training;Stair training;Functional mobility training;Therapeutic exercise;Balance training;Neuromuscular re-education;Therapeutic activities;Patient/family education;Other (comment);Energy conservation  prosthetic training   PT Next Visit Plan G-CODE.Marland KitchenMarland KitchenMarland KitchenAssess mobility independence with curb/ramp, stairs with LRAD. Reassess ambulation and HEP compliance.    PT Home Exercise Plan Instructed to increase ambulation in home with prosthesis and RW   Consulted and Agree with Plan of Care Patient        Problem List Patient Active Problem List   Diagnosis Date Noted  . Wound disruption,  post-op, skin 05/28/2013  . Encounter for therapeutic drug monitoring 04/22/2013  . Wound infection 04/16/2013  . Chronic infection of amputation stump 04/13/2013  . Phantom limb pain 03/24/2013  . Wound drainage 03/23/2013  . Encounter for staple removal 03/09/2013  . Acute blood loss anemia 02/10/2013  . Chronic kidney disease 02/10/2013  .  Unilateral AKA 02/09/2013  . Nontraumatic ischemic infarction of muscle of lower leg 02/04/2013  . Long term (current) use of anticoagulants 02/01/2013  . Pain, limb, left-Leg 01/21/2013  . Aftercare following surgery of the circulatory system, [place] 01/12/2013  . PVD (peripheral vascular disease) 12/22/2012  . Swelling of limb 10/07/2012  . Subclavian artery stenosis 09/08/2012  . Peripheral vascular disease, unspecified 08/18/2012  . Fever 07/08/2012  . Leg edema 06/02/2012  . Cardiomyopathy, ischemic 05/18/2012  . S/P ICD (internal cardiac defibrillator) procedure 05/18/2012  . Diabetes 05/18/2012  . Pain in limb 02/07/2012  . Atherosclerosis of native arteries of the extremities with intermittent claudication 12/17/2011  . Atherosclerotic PVD with intermittent claudication 10/15/2011  . Type I (juvenile type) diabetes mellitus with peripheral circulatory disorders, uncontrolled 09/06/2011  . PAD (peripheral artery disease) 09/04/2011  . CAD (coronary artery disease)   . Burning sensation of feet 05/10/2011  . Bursitis of right hip 05/10/2011  . Cervicalgia 05/10/2011  . Balance disorder 05/10/2011  . Neurogenic claudication 05/10/2011  . Lumbago 05/10/2011  . Chronic systolic congestive heart failure 07/06/2010  . ECZEMA 01/17/2010  . RENAL INSUFFICIENCY 11/01/2009  . UTI 11/01/2009  . CELLULITIS, LEG, RIGHT 08/22/2009  . LYMPHADENOPATHY, REACTIVE 08/22/2009  . PERIPHERAL NEUROPATHY 01/24/2009  . DIZZINESS 12/29/2008  . ABDOMINAL PAIN, GENERALIZED 08/22/2008  . Gout 09/18/2007  . CORONARY ARTERY DISEASE 09/18/2007  .  HYPERLIPIDEMIA 01/23/2007  . HYPERTENSION 01/23/2007  . Atrial fibrillation 01/23/2007  . BENIGN PROSTATIC HYPERTROPHY 01/23/2007  . LATERAL EPICONDYLITIS 01/23/2007                                            All PT delivered under supervision of Licensed Physical Therapist.   Blima Rich, Student PT 01/18/2014, 10:12 AM

## 2014-01-18 NOTE — Telephone Encounter (Signed)
Pt saw Dr. Naaman Plummer 01/17/2014, states that he can afford Rx's at his local [facility],  Requesting that scripts be sent to [facility] # [phone], I checked his snap shot and this is not the mail order pharmacy that he has listed in his chart

## 2014-01-18 NOTE — Therapy (Signed)
Physical Therapy Treatment  Patient Details  Name: [name] MRN: [id number] Date of Birth: Jun 19, 1934  Encounter Date: 01/18/2014      PT End of Session - 01/18/14 0956    Visit Number 71   Number of Visits 44   Date for PT Re-Evaluation 02/04/14   PT Start Time 0847   PT Stop Time 0931   PT Time Calculation (min) 44 min   Equipment Utilized During Treatment Gait belt   Activity Tolerance Patient tolerated treatment well;Patient limited by fatigue   Behavior During Therapy [facility] for tasks assessed/performed      Past Medical History  Diagnosis Date  . Gout   . Benign prostatic hypertrophy     takes Proscar daily  . Atrial fibrillation     takes Warfarin daily  . Chronic systolic dysfunction of left ventricle     a. mixed ischemic and nonischemic CM,  EF 35%. b. s/p AICD implantation.  . ED (erectile dysfunction)   . Arthritis   . Pacemaker     medtronic  . CAD (coronary artery disease)     a. s/p mid LAD stenting with DES 2008 Dr. Daneen Schick  . ICD (implantable cardiac defibrillator) in place     medtronic, Dr. Rayann Heman  . ICD (implantable cardiac defibrillator) in place   . Sleep apnea     hx of "had surgery for"  . Automatic implantable cardioverter-defibrillator in situ   . Depression   . Numbness and tingling     Hx; 8f left foot  . PAD (peripheral artery disease)     Severe by PV angiogram 09/2011  . Renal artery stenosis     s/p stenting 2009  . PAD (peripheral artery disease)     s/p multiple LLE bypass grafts; left mid-distal SCA occlusion by 08/2012 duplex  . Hypertension     takes Carvedilol and Losartan daily  . Constipation     takes Miralax daily as needed and Colace daily   . Type II diabetes mellitus     takes Novolog 70/30  . CHF (congestive heart failure)     takes Lasix daily  . History of MRSA infection   . Insomnia     TAKES TRAZODONE NIGHTLY    Past Surgical History  Procedure Laterality Date  . Turp vaporization    . Cardiac  defibrillator placement  12/26/09    pacemaker combo  . Cervical epidural injection  2013  . Femoral-tibial bypass graft  09/25/2011    Procedure: BYPASS GRAFT FEMORAL-TIBIAL ARTERY;  Surgeon: Mal Misty, MD;  Location: [facility] OR;  Service: Vascular;  Laterality: Left;  Left Femoral - Anterior Tibial Bypass;  saphenous vein graft from left leg  . Intraoperative arteriogram  09/25/2011    Procedure: INTRA OPERATIVE ARTERIOGRAM;  Surgeon: Mal Misty, MD;  Location: [place];  Service: Vascular;  Laterality: Left;  . Femoral-tibial bypass graft  02/07/2012    Procedure: BYPASS GRAFT FEMORAL-TIBIAL ARTERY;  Surgeon: Rosetta Posner, MD;  Location: [place];  Service: Vascular;  Laterality: Left;  Thrombectomy Left Femoral - Tibial Bypass Graft  . Embolectomy  02/07/2012    Procedure: EMBOLECTOMY;  Surgeon: Rosetta Posner, MD;  Location: [place];  Service: Vascular;  Laterality: Left;  . Femoral-tibial bypass graft  04/03/2012    Procedure: BYPASS GRAFT FEMORAL-TIBIAL ARTERY;  Surgeon: Mal Misty, MD;  Location: [place];  Service: Vascular;  Laterality: Left;  Redo  . Insert / replace / remove pacemaker  2007  . Coronary angioplasty with stent placement  ~ 2000  . Coronary angioplasty    . Uvulopalatopharyngoplasty (uppp)/tonsillectomy/septoplasty  06/30/2003    Archie Endo 06/30/2003 (07/10/2012)  . Shoulder open rotator cuff repair Right 2001    repair of lacerated right/notes 10/11/1999  (07/10/2012)  . Foot surgery Right 03/20/2001    "have plates and screws in; didn't break it" (07/10/2012)  . Carpal tunnel release Right 2002    Archie Endo 03/20/2001 (07/10/2012)  . Biceps tendon repair Right 2001    Archie Endo 03/20/2001 (07/10/2012)  . Renal artery stent  2009  . Femoral-popliteal bypass graft Left 09/16/2012    Procedure: LEFT FEMORAL-POPLITEAL BYPASS GRAFT WITH GORTEX Propaten Graft 6x80 Thin Wall and Left lower leg Angiogram;  Surgeon: Mal Misty, MD;  Location: [place];  Service: Vascular;  Laterality: Left;  .  Colonoscopy      Hx; of  . Tonsillectomy    . Adenoidectomy      Hx; of   . Femoral-tibial bypass graft Left 09/30/2012    Procedure: REDO LEFT FEMORAL-ANTERIOR TIBIAL ARTERY BYPASS USING COMPOSITE CEPHALIC AND BASILIC VEIN GRAFT FROM LEFT ARM;  Surgeon: Mal Misty, MD;  Location: [place];  Service: Vascular;  Laterality: Left;  . I&d extremity Left 10/21/2012    Procedure: EXPLORATION AND DEBRIDEMENT OF LEFT GROIN WOUND;  Surgeon: Mal Misty, MD;  Location: [place];  Service: Vascular;  Laterality: Left;  . Patch angioplasty Left 10/21/2012    Procedure: PATCH ANGIOPLASTY;  Surgeon: Mal Misty, MD;  Location: [place];  Service: Vascular;  Laterality: Left;  . Groin debridement Left 11/12/2012    Procedure: CLOSURE INGUINAL WOUND;  Surgeon: Mal Misty, MD;  Location: [place];  Service: Vascular;  Laterality: Left;  . Embolectomy Left 12/16/2012    Procedure: THROMBECTOMY  LEFT LEG BYPASS;  Surgeon: Elam Dutch, MD;  Location: [place];  Service: Vascular;  Laterality: Left;  . Leg amputation above knee Left 02/04/2013    DR LAWSON  . Amputation Left 02/04/2013    Procedure: AMPUTATION ABOVE KNEE-LEFT;  Surgeon: Mal Misty, MD;  Location: [place];  Service: Vascular;  Laterality: Left;  . Removal of graft Left 04/16/2013    Procedure: I & D LEFT AKA WOUND, POSSIBLE REMOVAL OF INFECTED GORTEX GRAFT;  Surgeon: Mal Misty, MD;  Location: [place];  Service: Vascular;  Laterality: Left;    There were no vitals taken for this visit.  Visit Diagnosis:  Abnormality of gait  Activity intolerance  Status post above knee amputation of left lower extremity      Subjective Assessment - 01/18/14 0859    Symptoms Patient has pain in bilateral thumbs due to gout.    Currently in Pain? Yes   Pain Score 9    Pain Location Other (Comment)  Bilateral thumb   Pain Orientation Right;Left   Pain Descriptors / Indicators Burning;Aching   Pain Type Acute pain   Pain Onset 1 to 4 weeks ago    Pain Frequency Constant   Pain Relieving Factors Ice            [facility] Adult PT Treatment/Exercise - 01/18/14 0900    Transfers   Transfers Sit to Stand;Stand to Sit   Sit to Stand 5: Supervision;With upper extremity assist;With armrests;From chair/3-in-1   Sit to Stand Details (indicate cue type and reason) no cueing required   Stand to Sit 5: Supervision;With upper extremity assist;To chair/3-in-1;With armrests   Stand to Sit  Details cues when standing to lock prosthetic knee out to allow weight bearing without buckling   Ambulation/Gait   Ambulation/Gait Yes   Ambulation/Gait Assistance 5: Supervision   Ambulation/Gait Assistance Details gait training in parallel bars for 20-25 minutes with tactile & visual (mirrors) cues on weight shift on to stance leg, verbal cues on posture and step length with mirror available for patient to observe performance. Instructed to heel strike at initial contact, focus on foot placement equal distance from midline. Pt ambulated with foam grips and coban on RW to increase padding and decrease stress on gout in bilateral thumbs.   Ambulation Distance (Feet) 160 Feet   Assistive device Rolling walker;Other (Comment)  above knee prosthesis (L)   Gait Pattern Step-through pattern;Decreased step length - right;Decreased stance time - left;Trunk flexed  abducted L leg   Gait velocity decreased   Exercises   Exercises --   Prosthetics   Prosthetic Care Comments  patient cued on tightening prosthesis during following toileting, increasing wear time of prosthesis, donning prosthesis when waking prior to breakfast. Instructed to increase wear to 4-5 hours 2x/day   Current prosthetic wear tolerance (days/week)  7   Current prosthetic wear tolerance (#hours/day)  6-9   Residual limb condition  per patient, in tact          PT Education - 01/18/14 0955    Education provided Yes   Education Details Increase activity at home to increase endurance. Wearing  prosthesis during ADL's to increase wear tolerance and activity tolerance. Foam padding and coban provided to patient for personal RW grips to decrease force on thumbs and allow for increased pain free mobility.   Person(s) Educated Patient   Methods Explanation   Comprehension Verbalized understanding;Need further instruction              Plan - 01/18/14 0957    Clinical Impression Statement Pt stated increased grip size with foam grips and coban allowed for ambulation without increase in pain. Pt demonstrates increased ambulation distance when ambulating with RW but still demonstrates gait deviations and significant use of BUE on RW causing decreased activity tolerance. Pt shows improved gait quality during gait training in parallel bars but need reinforcement for carryover to home. Increased use of BUE when walking is due to decreased confidence in single limb stance on prosthetic limb.    Pt will benefit from skilled therapeutic intervention in order to improve on the following deficits Abnormal gait;Decreased activity tolerance;Decreased balance;Decreased endurance;Decreased knowledge of use of DME;Decreased strength;Decreased range of motion;Pain;Difficulty walking;Decreased mobility, prosthetic dependency   Rehab Potential Good   Clinical Impairments Affecting Rehab Potential Fear of fallling   PT Frequency 2x / week   PT Duration 8 weeks   PT Treatment/Interventions DME Instruction;Gait training;Stair training;Functional mobility training;Therapeutic exercise;Balance training;Neuromuscular re-education;Therapeutic activities;Patient/family education;Other (comment);Energy conservation, prosthetic training prosthetic training   PT Next Visit Plan G-CODE.Marland KitchenMarland KitchenMarland KitchenAssess mobility independence with curb/ramp, stairs with LRAD. Reassess ambulation and HEP compliance.    PT Home Exercise Plan Instructed to increase ambulation in home with prosthesis and RW   Consulted and Agree with Plan of Care  Patient        Problem List Patient Active Problem List   Diagnosis Date Noted  . Wound disruption, post-op, skin 05/28/2013  . Encounter for therapeutic drug monitoring 04/22/2013  . Wound infection 04/16/2013  . Chronic infection of amputation stump 04/13/2013  . Phantom limb pain 03/24/2013  . Wound drainage 03/23/2013  . Encounter for staple removal 03/09/2013  .  Acute blood loss anemia 02/10/2013  . Chronic kidney disease 02/10/2013  . Unilateral AKA 02/09/2013  . Nontraumatic ischemic infarction of muscle of lower leg 02/04/2013  . Long term (current) use of anticoagulants 02/01/2013  . Pain, limb, left-Leg 01/21/2013  . Aftercare following surgery of the circulatory system, [place] 01/12/2013  . PVD (peripheral vascular disease) 12/22/2012  . Swelling of limb 10/07/2012  . Subclavian artery stenosis 09/08/2012  . Peripheral vascular disease, unspecified 08/18/2012  . Fever 07/08/2012  . Leg edema 06/02/2012  . Cardiomyopathy, ischemic 05/18/2012  . S/P ICD (internal cardiac defibrillator) procedure 05/18/2012  . Diabetes 05/18/2012  . Pain in limb 02/07/2012  . Atherosclerosis of native arteries of the extremities with intermittent claudication 12/17/2011  . Atherosclerotic PVD with intermittent claudication 10/15/2011  . Type I (juvenile type) diabetes mellitus with peripheral circulatory disorders, uncontrolled 09/06/2011  . PAD (peripheral artery disease) 09/04/2011  . CAD (coronary artery disease)   . Burning sensation of feet 05/10/2011  . Bursitis of right hip 05/10/2011  . Cervicalgia 05/10/2011  . Balance disorder 05/10/2011  . Neurogenic claudication 05/10/2011  . Lumbago 05/10/2011  . Chronic systolic congestive heart failure 07/06/2010  . ECZEMA 01/17/2010  . RENAL INSUFFICIENCY 11/01/2009  . UTI 11/01/2009  . CELLULITIS, LEG, RIGHT 08/22/2009  . LYMPHADENOPATHY, REACTIVE 08/22/2009  . PERIPHERAL NEUROPATHY 01/24/2009  . DIZZINESS 12/29/2008  .  ABDOMINAL PAIN, GENERALIZED 08/22/2008  . Gout 09/18/2007  . CORONARY ARTERY DISEASE 09/18/2007  . HYPERLIPIDEMIA 01/23/2007  . HYPERTENSION 01/23/2007  . Atrial fibrillation 01/23/2007  . BENIGN PROSTATIC HYPERTROPHY 01/23/2007  . LATERAL EPICONDYLITIS 01/23/2007    This entire session of physical therapy was performed under the direct supervision of PT signing evaluation /treatment.    Blima Rich, Student PT  Jamey Reas, PT, DPT 01/18/2014, 3:21 PM

## 2014-01-19 NOTE — Telephone Encounter (Signed)
i am willing to do whatever works for him.  Narcotics can not be mail ordered however

## 2014-01-20 ENCOUNTER — Encounter: Payer: Self-pay | Admitting: Physical Therapy

## 2014-01-20 ENCOUNTER — Ambulatory Visit: Payer: Medicare Other | Admitting: Physical Therapy

## 2014-01-20 DIAGNOSIS — Z89612 Acquired absence of left leg above knee: Secondary | ICD-10-CM

## 2014-01-20 DIAGNOSIS — R269 Unspecified abnormalities of gait and mobility: Secondary | ICD-10-CM

## 2014-01-20 DIAGNOSIS — R6889 Other general symptoms and signs: Secondary | ICD-10-CM

## 2014-01-20 DIAGNOSIS — M79672 Pain in left foot: Secondary | ICD-10-CM

## 2014-01-20 DIAGNOSIS — I999 Unspecified disorder of circulatory system: Secondary | ICD-10-CM

## 2014-01-20 NOTE — Therapy (Signed)
Physical Therapy Treatment  Patient Details  Name: SPIRO AUSBORN MRN: [id number] Date of Birth: Jun 23, 1934  Encounter Date: 01/20/2014      PT End of Session - 01/20/14 1503    Visit Number 40   Number of Visits 44   Date for PT Re-Evaluation 02/04/14   PT Start Time 0845   PT Stop Time 0929   PT Time Calculation (min) 44 min   Equipment Utilized During Treatment Gait belt      Past Medical History  Diagnosis Date  . Gout   . Benign prostatic hypertrophy     takes Proscar daily  . Atrial fibrillation     takes Warfarin daily  . Chronic systolic dysfunction of left ventricle     a. mixed ischemic and nonischemic CM,  EF 35%. b. s/p AICD implantation.  . ED (erectile dysfunction)   . Arthritis   . Pacemaker     medtronic  . CAD (coronary artery disease)     a. s/p mid LAD stenting with DES 2008 Dr. Daneen Schick  . ICD (implantable cardiac defibrillator) in place     medtronic, Dr. Rayann Heman  . ICD (implantable cardiac defibrillator) in place   . Sleep apnea     hx of "had surgery for"  . Automatic implantable cardioverter-defibrillator in situ   . Depression   . Numbness and tingling     Hx; 75fleft foot  . PAD (peripheral artery disease)     Severe by PV angiogram 09/2011  . Renal artery stenosis     s/p stenting 2009  . PAD (peripheral artery disease)     s/p multiple LLE bypass grafts; left mid-distal SCA occlusion by 08/2012 duplex  . Hypertension     takes Carvedilol and Losartan daily  . Constipation     takes Miralax daily as needed and Colace daily   . Type II diabetes mellitus     takes Novolog 70/30  . CHF (congestive heart failure)     takes Lasix daily  . History of MRSA infection   . Insomnia     TAKES TRAZODONE NIGHTLY    Past Surgical History  Procedure Laterality Date  . Turp vaporization    . Cardiac defibrillator placement  12/26/09    pacemaker combo  . Cervical epidural injection  2013  . Femoral-tibial bypass graft  09/25/2011   Procedure: BYPASS GRAFT FEMORAL-TIBIAL ARTERY;  Surgeon: JMal Misty MD;  [facility]  Service: Vascular;  Laterality: Left;  Left Femoral - Anterior Tibial Bypass;  saphenous vein graft from left leg  . Intraoperative arteriogram  09/25/2011    Procedure: INTRA OPERATIVE ARTERIOGRAM;  Surgeon: JMal Misty MD;  [place] [facility]  Service: Vascular;  Laterality: Left;  . Femoral-tibial bypass graft  02/07/2012    Procedure: BYPASS GRAFT FEMORAL-TIBIAL ARTERY;  Surgeon: TRosetta Posner MD;  [place] [facility]  Service: Vascular;  Laterality: Left;  Thrombectomy Left Femoral - Tibial Bypass Graft  . Embolectomy  02/07/2012    Procedure: EMBOLECTOMY;  Surgeon: TRosetta Posner MD;  [place] [facility]  Service: Vascular;  Laterality: Left;  . Femoral-tibial bypass graft  04/03/2012    Procedure: BYPASS GRAFT FEMORAL-TIBIAL ARTERY;  Surgeon: JMal Misty MD;  [place]  Service: Vascular;  Laterality: Left;  Redo  . Insert / replace / remove pacemaker  2007  . Coronary angioplasty with stent placement  ~ 2000  . Coronary angioplasty    . Uvulopalatopharyngoplasty (uppp)/tonsillectomy/septoplasty  06/30/2003    Archie Endo 06/30/2003 (07/10/2012)  . Shoulder open rotator cuff repair Right 2001    repair of lacerated right/notes 10/11/1999  (07/10/2012)  . Foot surgery Right 03/20/2001    "have plates and screws in; didn't break it" (07/10/2012)  . Carpal tunnel release Right 2002    Archie Endo 03/20/2001 (07/10/2012)  . Biceps tendon repair Right 2001    Archie Endo 03/20/2001 (07/10/2012)  . Renal artery stent  2009  . Femoral-popliteal bypass graft Left 09/16/2012    Procedure: LEFT FEMORAL-POPLITEAL BYPASS GRAFT WITH GORTEX Propaten Graft 6x80 Thin Wall and Left lower leg Angiogram;  Surgeon: Mal Misty, MD;  Location: [place];  Service: Vascular;  Laterality: Left;  . Colonoscopy      Hx; of  . Tonsillectomy    . Adenoidectomy      Hx; of   . Femoral-tibial bypass graft Left 09/30/2012    Procedure: REDO LEFT  FEMORAL-ANTERIOR TIBIAL ARTERY BYPASS USING COMPOSITE CEPHALIC AND BASILIC VEIN GRAFT FROM LEFT ARM;  Surgeon: Mal Misty, MD;  Location: [place];  Service: Vascular;  Laterality: Left;  . I&d extremity Left 10/21/2012    Procedure: EXPLORATION AND DEBRIDEMENT OF LEFT GROIN WOUND;  Surgeon: Mal Misty, MD;  Location: [place];  Service: Vascular;  Laterality: Left;  . Patch angioplasty Left 10/21/2012    Procedure: PATCH ANGIOPLASTY;  Surgeon: Mal Misty, MD;  Location: [place];  Service: Vascular;  Laterality: Left;  . Groin debridement Left 11/12/2012    Procedure: CLOSURE INGUINAL WOUND;  Surgeon: Mal Misty, MD;  Location: [place];  Service: Vascular;  Laterality: Left;  . Embolectomy Left 12/16/2012    Procedure: THROMBECTOMY  LEFT LEG BYPASS;  Surgeon: Elam Dutch, MD;  Location: [place];  Service: Vascular;  Laterality: Left;  . Leg amputation above knee Left 02/04/2013    DR LAWSON  . Amputation Left 02/04/2013    Procedure: AMPUTATION ABOVE KNEE-LEFT;  Surgeon: Mal Misty, MD;  Location: [place];  Service: Vascular;  Laterality: Left;  . Removal of graft Left 04/16/2013    Procedure: I & D LEFT AKA WOUND, POSSIBLE REMOVAL OF INFECTED GORTEX GRAFT;  Surgeon: Mal Misty, MD;  Location: [place];  Service: Vascular;  Laterality: Left;    There were no vitals taken for this visit.  Visit Diagnosis:  Abnormality of gait  Activity intolerance  Status post above knee amputation of left lower extremity  Ischemic pain of foot, left      Subjective Assessment - 01/20/14 0851    Symptoms Pt reports feeling tired today due to not sleeping well last night. Believes the gout pain was keeping him awake all night. Patient does not believe the medication to treat his gout is providing in symptom relief.    Limitations Standing;Walking   Currently in Pain? Yes   Pain Score 8    Pain Location Other (Comment)  Bilateral thumb   Pain Orientation Right;Left   Pain Descriptors /  Indicators Burning;Aching   Pain Type Acute pain   Pain Onset 1 to 4 weeks ago   Pain Frequency Constant   Aggravating Factors  weight bearing through hands   Pain Relieving Factors Medication            [facility] Adult PT Treatment/Exercise - 01/20/14 0900    Transfers   Transfers Sit to Stand;Stand to Sit   Sit to Stand 5: Supervision;With upper extremity assist;With armrests;From chair/3-in-1  from chair with no armrests  Sit to Stand Details (indicate cue type and reason) cues for sequencing when standing from chair with no arm rests  x3 for sit>stand training   Stand to Sit 5: Supervision;With upper extremity assist;To chair/3-in-1;With armrests   Stand to Sit Details cues to reach back after trunk lean prior to sitting   Ambulation/Gait   Ambulation/Gait Yes   Ambulation/Gait Assistance 4: Min guard   Ambulation/Gait Assistance Details gait training with moderate verbal cues to decrease step length on L and increase step length on R for symmetrical gait. Patient still requires significant UE use on RW during single leg stance on affected LE. Demonstrates decreased confidence in LLE ability to hold body weight and not buckle during single leg stance.    Ambulation Distance (Feet) 58 Feet  x2   Assistive device Rolling walker;Other (Comment)  LLE above knee prosthesis unlocked   Gait Pattern Step-to pattern;Step-through pattern;Decreased step length - right;Decreased stance time - left;Trunk flexed  d   Gait velocity decreased   Stairs Yes   Stairs Assistance 4: Min guard;3: Mod assist   Stairs Assistance Details (indicate cue type and reason) min guard ascending steps, moderate assist when descending steps. Patient has difficulty locking out prosthetic limb to accept full body weight during swing phase or RLE. Patient compensates with posterior weight shift requiring tactile cues to improve posture and reduce chance of posterior balance loss. Patient more successful with stair  when knee is in lock mode.   Stair Management Technique Two rails;Step to pattern;Forwards;Other (comment)  with above knee prosthesis locked   Number of Stairs 4  x2 (1 unlocked, 1 locked)   Ramp 4: Min assist  prosthesis in locked mode   Ramp Details (indicate cue type and reason) for balance with cues on step length and to ambulate with swing through gait pattern.   Curb 4: Min assist  prosthesis in locked mode   Curb Details (indicate cue type and reason) required cues on foot placement prior to bringing rolling walker down step.   Prosthetics   Current prosthetic wear tolerance (days/week)  7   Current prosthetic wear tolerance (#hours/day)  6-8   Residual limb condition  per patient, no changes, in tact          PT Education - 01/20/14 1502    Education provided Yes   Education Details HEP review, importance of activity at home to increase endurance outside of therapy.    Person(s) Educated Patient   Methods Explanation   Comprehension Verbalized understanding;Need further instruction          PT Short Term Goals - 01/20/14 0900    PT SHORT TERM GOAL #1   Title ambulates 150' with crutches & prosthesis with supervision.   Baseline Patient ambulated 125' with crutches & prosthesis with minimal gaurd assist.   Status Not Met   PT SHORT TERM GOAL #2   Title ambulate 28' with single point cane & prosthesis with supervision   Baseline Patient ambulated 52' with prosthetic knee unlocked with maximal assist / hand hold & 15' with prosthetic knee locked with moderate assist /hand hold.   Status Not Met   PT SHORT TERM GOAL #3   Title negotiate ramp & curb with crutches & prosthesis with minimal assist   Baseline Patient negotiated ramp & curb with crutches with minimal assist for curb & moderate assist on ramp with constant cueing.   Status Not Met   PT SHORT TERM GOAL #4   Title reaches 5"  without UE support safely with supervision.   Baseline reaches 2" without UE  support with minimal assist.   Status Not Met          PT Long Term Goals - 01/20/14 0900    PT LONG TERM GOAL #1   Title Patient demonstrates & verbalizes proper prosthetic care to enable safe utilization of the prosthesis. 02/04/2014   Status On-going   PT LONG TERM GOAL #2   Title tolerates wear of prosthesis >90% of awake hours without change in skin integrity nor undue tenderness. 02/04/2014   Status On-going   PT LONG TERM GOAL #3   Title ambulates 200' with rolling walker & prosthesis modified independent. 02/04/2014   Status On-going   PT LONG TERM GOAL #4   Title ambulate 50' on uneven (grass) surfaces with rolling walker & prosthesis modified independent. 12/04/205   Status On-going   PT LONG TERM GOAL #5   Title negotiate ramp, curb & stairs (1 rail) with rolling walker & prosthesis modified independent. 02/04/2014   Status On-going   PT LONG TERM GOAL #6   Title perform standing activities for >10 minutes with prosthesis with pain /discomfort </= 2/10 increments on 0-10 scale. 02/04/2014   Status On-going   PT LONG TERM GOAL #7   Title Berg Balance Test score with prosthesis >/= 30/56. 02/04/2014   Status On-going   PT LONG TERM GOAL #8   Title self-report via FOTO improvement >10 points in Functional Status Measure. 02/04/2014   Status On-going   PT LONG TERM GOAL  #9   TITLE ambulates 50' carrying cup or plate with cane & prosthesis modified independent. 02/04/2014   Status Deferred          Plan - 01/20/14 1504    Clinical Impression Statement Patient mobility limited today due to general fatigued stated to be caused by gout flare up affecting his sleep last night. Patient uses significant BUE support when ambulating with RW and is now limited by bilateral gout (thumb) pain in mobility in therapy.   Pt will benefit from skilled therapeutic intervention in order to improve on the following deficits Abnormal gait;Decreased activity tolerance;Decreased  balance;Decreased endurance;Decreased knowledge of use of DME;Decreased strength;Decreased range of motion;Pain;Difficulty walking;Decreased mobility   Rehab Potential Good   Clinical Impairments Affecting Rehab Potential Fear of fallling   PT Frequency 2x / week   PT Duration 8 weeks   PT Treatment/Interventions DME Instruction;Gait training;Stair training;Functional mobility training;Therapeutic exercise;Balance training;Neuromuscular re-education;Therapeutic activities;Patient/family education;Other (comment);Energy conservation  prosthetic training   PT Next Visit Plan gait training in parallel bars to work on weight shifting and help patient get better feel of when weight shift to toe generates knee unlock.    Consulted and Agree with Plan of Care Patient        Problem List Patient Active Problem List   Diagnosis Date Noted  . Wound disruption, post-op, skin 05/28/2013  . Encounter for therapeutic drug monitoring 04/22/2013  . Wound infection 04/16/2013  . Chronic infection of amputation stump 04/13/2013  . Phantom limb pain 03/24/2013  . Wound drainage 03/23/2013  . Encounter for staple removal 03/09/2013  . Acute blood loss anemia 02/10/2013  . Chronic kidney disease 02/10/2013  . Unilateral AKA 02/09/2013  . Nontraumatic ischemic infarction of muscle of lower leg 02/04/2013  . Long term (current) use of anticoagulants 02/01/2013  . Pain, limb, left-Leg 01/21/2013  . Aftercare following surgery of the circulatory system, [place] 01/12/2013  . PVD (peripheral vascular  disease) 12/22/2012  . Swelling of limb 10/07/2012  . Subclavian artery stenosis 09/08/2012  . Peripheral vascular disease, unspecified 08/18/2012  . Fever 07/08/2012  . Leg edema 06/02/2012  . Cardiomyopathy, ischemic 05/18/2012  . S/P ICD (internal cardiac defibrillator) procedure 05/18/2012  . Diabetes 05/18/2012  . Pain in limb 02/07/2012  . Atherosclerosis of native arteries of the extremities with  intermittent claudication 12/17/2011  . Atherosclerotic PVD with intermittent claudication 10/15/2011  . Type I (juvenile type) diabetes mellitus with peripheral circulatory disorders, uncontrolled 09/06/2011  . PAD (peripheral artery disease) 09/04/2011  . CAD (coronary artery disease)   . Burning sensation of feet 05/10/2011  . Bursitis of right hip 05/10/2011  . Cervicalgia 05/10/2011  . Balance disorder 05/10/2011  . Neurogenic claudication 05/10/2011  . Lumbago 05/10/2011  . Chronic systolic congestive heart failure 07/06/2010  . ECZEMA 01/17/2010  . RENAL INSUFFICIENCY 11/01/2009  . UTI 11/01/2009  . CELLULITIS, LEG, RIGHT 08/22/2009  . LYMPHADENOPATHY, REACTIVE 08/22/2009  . PERIPHERAL NEUROPATHY 01/24/2009  . DIZZINESS 12/29/2008  . ABDOMINAL PAIN, GENERALIZED 08/22/2008  . Gout 09/18/2007  . CORONARY ARTERY DISEASE 09/18/2007  . HYPERLIPIDEMIA 01/23/2007  . HYPERTENSION 01/23/2007  . Atrial fibrillation 01/23/2007  . BENIGN PROSTATIC HYPERTROPHY 01/23/2007  . LATERAL EPICONDYLITIS 01/23/2007              All PT delivered under supervision of Licensed Physical Brewing technologist.    Blima Rich, Student PT 01/20/2014, 3:10 PM   Willow Ora, PTA, [place] [address], [place], [place] [address] [phone] 01/20/2014, 3:17 PM

## 2014-01-21 LAB — ETHYL GLUCURONIDE, URINE
ETHYL SULFATE (ETS): 642 ng/mL — AB (ref <100)
Ethyl Glucuronide (EtG): 1074 ng/mL — ABNORMAL HIGH (ref <500)

## 2014-01-22 LAB — PRESCRIPTION MONITORING PROFILE (SOLSTAS)
AMPHETAMINE/METH: NEGATIVE ng/mL
BARBITURATE SCREEN, URINE: NEGATIVE ng/mL
BENZODIAZEPINE SCREEN, URINE: NEGATIVE ng/mL
Buprenorphine, Urine: NEGATIVE ng/mL
CARISOPRODOL, URINE: NEGATIVE ng/mL
Cannabinoid Scrn, Ur: NEGATIVE ng/mL
Cocaine Metabolites: NEGATIVE ng/mL
Creatinine, Urine: 86.51 mg/dL (ref >20.0)
Fentanyl, Ur: NEGATIVE ng/mL
MDMA URINE: NEGATIVE ng/mL
Meperidine, Ur: NEGATIVE ng/mL
Methadone Screen, Urine: NEGATIVE ng/mL
Nitrites, Initial: NEGATIVE ug/mL
OPIATE SCREEN, URINE: NEGATIVE ng/mL
Oxycodone Screen, Ur: NEGATIVE ng/mL
PH URINE, INITIAL: 5.6 pH (ref 4.5–8.9)
PROPOXYPHENE: NEGATIVE ng/mL
TAPENTADOLUR: NEGATIVE ng/mL
TRAMADOL UR: NEGATIVE ng/mL
ZOLPIDEM, URINE: NEGATIVE ng/mL

## 2014-01-24 ENCOUNTER — Ambulatory Visit: Payer: Medicare Other | Admitting: Physical Therapy

## 2014-01-24 ENCOUNTER — Encounter: Payer: Self-pay | Admitting: Physical Therapy

## 2014-01-24 DIAGNOSIS — Z89612 Acquired absence of left leg above knee: Secondary | ICD-10-CM | POA: Diagnosis not present

## 2014-01-24 DIAGNOSIS — R6889 Other general symptoms and signs: Secondary | ICD-10-CM

## 2014-01-24 DIAGNOSIS — R269 Unspecified abnormalities of gait and mobility: Secondary | ICD-10-CM

## 2014-01-24 NOTE — Therapy (Signed)
Physical Therapy Treatment  Patient Details  Name: [name] MRN: [id number] Date of Birth: Jan 03, 1935  Encounter Date: 01/24/2014      PT End of Session - 01/24/14 1033    Visit Number 41   Number of Visits 44   Date for PT Re-Evaluation 02/04/14   PT Start Time 0932   PT Stop Time 1017   PT Time Calculation (min) 45 min   Equipment Utilized During Treatment Gait belt   Activity Tolerance Patient tolerated treatment well;Patient limited by fatigue   Behavior During Therapy [facility] for tasks assessed/performed      Past Medical History  Diagnosis Date  . Gout   . Benign prostatic hypertrophy     takes Proscar daily  . Atrial fibrillation     takes Warfarin daily  . Chronic systolic dysfunction of left ventricle     a. mixed ischemic and nonischemic CM,  EF 35%. b. s/p AICD implantation.  . ED (erectile dysfunction)   . Arthritis   . Pacemaker     medtronic  . CAD (coronary artery disease)     a. s/p mid LAD stenting with DES 2008 Dr. Daneen Schick  . ICD (implantable cardiac defibrillator) in place     medtronic, Dr. Rayann Heman  . ICD (implantable cardiac defibrillator) in place   . Sleep apnea     hx of "had surgery for"  . Automatic implantable cardioverter-defibrillator in situ   . Depression   . Numbness and tingling     Hx; 54fleft foot  . PAD (peripheral artery disease)     Severe by PV angiogram 09/2011  . Renal artery stenosis     s/p stenting 2009  . PAD (peripheral artery disease)     s/p multiple LLE bypass grafts; left mid-distal SCA occlusion by 08/2012 duplex  . Hypertension     takes Carvedilol and Losartan daily  . Constipation     takes Miralax daily as needed and Colace daily   . Type II diabetes mellitus     takes Novolog 70/30  . CHF (congestive heart failure)     takes Lasix daily  . History of MRSA infection   . Insomnia     TAKES TRAZODONE NIGHTLY    Past Surgical History  Procedure Laterality Date  . Turp vaporization    . Cardiac  defibrillator placement  12/26/09    pacemaker combo  . Cervical epidural injection  2013  . Femoral-tibial bypass graft  09/25/2011    Procedure: BYPASS GRAFT FEMORAL-TIBIAL ARTERY;  Surgeon: JMal Misty MD;  [facility];  Service: Vascular;  Laterality: Left;  Left Femoral - Anterior Tibial Bypass;  saphenous vein graft from left leg  . Intraoperative arteriogram  09/25/2011    Procedure: INTRA OPERATIVE ARTERIOGRAM;  Surgeon: JMal Misty MD;  [place]  Service: Vascular;  Laterality: Left;  . Femoral-tibial bypass graft  02/07/2012    Procedure: BYPASS GRAFT FEMORAL-TIBIAL ARTERY;  Surgeon: TRosetta Posner MD;  [place]  Service: Vascular;  Laterality: Left;  Thrombectomy Left Femoral - Tibial Bypass Graft  . Embolectomy  02/07/2012    Procedure: EMBOLECTOMY;  Surgeon: TRosetta Posner MD;  [place]  Service: Vascular;  Laterality: Left;  . Femoral-tibial bypass graft  04/03/2012    Procedure: BYPASS GRAFT FEMORAL-TIBIAL ARTERY;  Surgeon: JMal Misty MD;  [place]  Service: Vascular;  Laterality: Left;  Redo  . Insert / replace / remove pacemaker  2007  . Coronary angioplasty with stent placement  ~ 2000  . Coronary angioplasty    . Uvulopalatopharyngoplasty (uppp)/tonsillectomy/septoplasty  06/30/2003    Archie Endo 06/30/2003 (07/10/2012)  . Shoulder open rotator cuff repair Right 2001    repair of lacerated right/notes 10/11/1999  (07/10/2012)  . Foot surgery Right 03/20/2001    "have plates and screws in; didn't break it" (07/10/2012)  . Carpal tunnel release Right 2002    Archie Endo 03/20/2001 (07/10/2012)  . Biceps tendon repair Right 2001    Archie Endo 03/20/2001 (07/10/2012)  . Renal artery stent  2009  . Femoral-popliteal bypass graft Left 09/16/2012    Procedure: LEFT FEMORAL-POPLITEAL BYPASS GRAFT WITH GORTEX Propaten Graft 6x80 Thin Wall and Left lower leg Angiogram;  Surgeon: Mal Misty, MD;  Location: [place];  Service: Vascular;  Laterality: Left;  .  Colonoscopy      Hx; of  . Tonsillectomy    . Adenoidectomy      Hx; of   . Femoral-tibial bypass graft Left 09/30/2012    Procedure: REDO LEFT FEMORAL-ANTERIOR TIBIAL ARTERY BYPASS USING COMPOSITE CEPHALIC AND BASILIC VEIN GRAFT FROM LEFT ARM;  Surgeon: Mal Misty, MD;  Location: [place];  Service: Vascular;  Laterality: Left;  . I&d extremity Left 10/21/2012    Procedure: EXPLORATION AND DEBRIDEMENT OF LEFT GROIN WOUND;  Surgeon: Mal Misty, MD;  Location: [place];  Service: Vascular;  Laterality: Left;  . Patch angioplasty Left 10/21/2012    Procedure: PATCH ANGIOPLASTY;  Surgeon: Mal Misty, MD;  Location: [place];  Service: Vascular;  Laterality: Left;  . Groin debridement Left 11/12/2012    Procedure: CLOSURE INGUINAL WOUND;  Surgeon: Mal Misty, MD;  Location: [place];  Service: Vascular;  Laterality: Left;  . Embolectomy Left 12/16/2012    Procedure: THROMBECTOMY  LEFT LEG BYPASS;  Surgeon: Elam Dutch, MD;  Location: [place];  Service: Vascular;  Laterality: Left;  . Leg amputation above knee Left 02/04/2013    DR LAWSON  . Amputation Left 02/04/2013    Procedure: AMPUTATION ABOVE KNEE-LEFT;  Surgeon: Mal Misty, MD;  Location: [place];  Service: Vascular;  Laterality: Left;  . Removal of graft Left 04/16/2013    Procedure: I & D LEFT AKA WOUND, POSSIBLE REMOVAL OF INFECTED GORTEX GRAFT;  Surgeon: Mal Misty, MD;  Location: [place];  Service: Vascular;  Laterality: Left;    There were no vitals taken for this visit.  Visit Diagnosis:  Abnormality of gait  Activity intolerance  Status post above knee amputation of left lower extremity      Subjective Assessment - 01/24/14 0936    Symptoms Did not sleep well again last night due to bilateral thumb pain caused by gout. Pt had R knee buckle (sound leg) in public and fell into chair, did not fall to floor.    Limitations Standing;Walking   Patient Stated Goals To use prosthesis to be active in community including  playing golf   Currently in Pain? Yes   Pain Score 9    Pain Location Hand  bilateral thumb   Pain Orientation Right;Left   Pain Descriptors / Indicators Aching   Pain Type Acute pain   Pain Onset 1 to 4 weeks ago   Pain Frequency Constant   Aggravating Factors  weight bearing through hands   Pain Relieving Factors medication   Multiple Pain Sites Yes   Pain Score 5   Pain Type Phantom pain   Pain Location  Leg   Pain Orientation Left   Pain Frequency Occasional     Gait Training with prosthesis performed in parallel bars initially focusing on foot position of LLE at initial contact in line of progression x10 correcting previous abducted foot placement. With staggered stance (LLE in front), weight shifting x10 and encouraging patient to feel when he shifts his weight forward at what point it causes the knee to unlock. Progressed to stepping with RLE working on weight shift towards LLE in single leg stance, posture by have patient push his back into PT's chest, and bringing right leg forward with BUE support on parallel bars and visual cues from mirror.   Home Exercise Plan: PT and patient reviewed "sink exercises" in parallel bars with PT demonstration, patient was able to verbalize understanding and return demonstrate but requires follow-up on compliance at home. Patient was given written hand-out of exercises in addition to Gout education to review at home.         [facility] Adult PT Treatment/Exercise - 01/24/14 1000    Transfers   Transfers Sit to Stand   Sit to Stand 5: Supervision;With upper extremity assist;With armrests;From chair/3-in-1   Sit to Stand Details (indicate cue type and reason) supervision for safety and cues to lock LLE prosthesis and shift weight equally between BLE   Stand to Sit 5: Supervision;With upper extremity assist;To chair/3-in-1;With armrests   Ambulation/Gait   Ambulation/Gait Yes   Ambulation/Gait Assistance 4: Min guard  for safety in case of knee  buckling   Ambulation/Gait Assistance Details pt ambulates with increased trunk flexion, maximal BUE assist on RW, decreased stance time on LLE and decreased step length on RLE. Pt has difficulty with LLE foot placement, weight shift onto LLE during transition from initial contact to midstance which is caused by both muscle weakness and lack of confidence in single leg stance on prosthetic limb.   Ambulation Distance (Feet) 115 Feet  x1   Assistive device Rolling walker;Other (Comment)  LLE above knee prosthesis   Gait Pattern Step-through pattern;Decreased step length - left;Decreased stance time - left;Decreased weight shift to left;Trunk flexed;Wide base of support   Gait velocity decreased   Exercises   Exercises Balance   Prosthetics   Current prosthetic wear tolerance (days/week)  7   Current prosthetic wear tolerance (#hours/day)  6-8   Residual limb condition  per patient, no changes, in tact          PT Education - 01/24/14 1009    Education provided Yes   Education Details gout, consulting pharmicist regarding medication concerns, HEP   Person(s) Educated Patient   Methods Explanation;Demonstration;Handout   Comprehension Verbalized understanding;Returned demonstration          PT Short Term Goals - 01/24/14 0930    PT SHORT TERM GOAL #1   Title ambulates 150' with crutches & prosthesis with supervision.   Baseline Patient ambulated 125' with crutches & prosthesis with minimal gaurd assist.   Status Not Met   PT SHORT TERM GOAL #2   Title ambulate 86' with single point cane & prosthesis with supervision   Baseline Patient ambulated 20' with prosthetic knee unlocked with maximal assist / hand hold & 15' with prosthetic knee locked with moderate assist /hand hold.   Status Not Met   PT SHORT TERM GOAL #3   Title negotiate ramp & curb with crutches & prosthesis with minimal assist   Baseline Patient negotiated ramp & curb with crutches with minimal assist for curb &  moderate assist on ramp with constant cueing.   Status Not Met   PT SHORT TERM GOAL #4   Title reaches 5" without UE support safely with supervision.   Baseline reaches 2" without UE support with minimal assist.   Status Not Met          PT Long Term Goals - 01/24/14 0930    PT LONG TERM GOAL #1   Title Patient demonstrates & verbalizes proper prosthetic care to enable safe utilization of the prosthesis. 02/04/2014   Status On-going   PT LONG TERM GOAL #2   Title tolerates wear of prosthesis >90% of awake hours without change in skin integrity nor undue tenderness. 02/04/2014   Status On-going   PT LONG TERM GOAL #3   Title ambulates 200' with rolling walker & prosthesis modified independent. 02/04/2014   Status Revised   PT LONG TERM GOAL #4   Title ambulate 50' on uneven (grass) surfaces with rolling walker & prosthesis modified independent. 12/04/205   Status Revised   PT LONG TERM GOAL #5   Title negotiate ramp, curb & stairs (1 rail) with rolling walker & prosthesis modified independent. 02/04/2014   Status Revised   PT LONG TERM GOAL #6   Title perform standing activities for >10 minutes with prosthesis with pain /discomfort </= 2/10 increments on 0-10 scale. 02/04/2014   Status On-going   PT LONG TERM GOAL #7   Title Berg Balance Test score with prosthesis >/= 30/56. 02/04/2014   Status On-going   PT LONG TERM GOAL #8   Title self-report via FOTO improvement >10 points in Functional Status Measure. 02/04/2014   Status On-going   PT LONG TERM GOAL  #9   TITLE ambulates 50' carrying cup or plate with cane & prosthesis modified independent. 02/04/2014   Status Deferred          Plan - 01/24/14 1033    Clinical Impression Statement Patient limited in activity today by general fatigue from restless evening (stated to be caused by current gout flare up). Patient has difficulty trusting prosthesis during ambulation in single leg stance without buckling. Patient seems to  be more comfortable with prosthetic limb locked during mobility.    Pt will benefit from skilled therapeutic intervention in order to improve on the following deficits Abnormal gait;Decreased activity tolerance;Decreased balance;Decreased endurance;Decreased knowledge of use of DME;Decreased strength;Decreased range of motion;Pain;Difficulty walking;Decreased mobility   Rehab Potential Good   PT Frequency 2x / week   PT Duration 8 weeks   PT Treatment/Interventions DME Instruction;Gait training;Stair training;Functional mobility training;Therapeutic exercise;Balance training;Neuromuscular re-education;Therapeutic activities;Patient/family education;Other (comment);Energy conservation  Prosthetic training   PT Next Visit Plan Continue gait training in parallel bars to work on weight shifting in parallel bars focusing on LLE foot placement, weight shifting to prosthetic side, larger steps with RLE. Review HEP (sink exercises) compliance.   PT Home Exercise Plan Sink exercises. Instructed to increase ambulation in home with prosthesis and RW.   Consulted and Agree with Plan of Care Patient        Problem List Patient Active Problem List   Diagnosis Date Noted  . Wound disruption, post-op, skin 05/28/2013  . Encounter for therapeutic drug monitoring 04/22/2013  . Wound infection 04/16/2013  . Chronic infection of amputation stump 04/13/2013  . Phantom limb pain 03/24/2013  . Wound drainage 03/23/2013  . Encounter for staple removal 03/09/2013  . Acute blood loss anemia 02/10/2013  . Chronic kidney disease 02/10/2013  . Unilateral AKA 02/09/2013  .  Nontraumatic ischemic infarction of muscle of lower leg 02/04/2013  . Long term (current) use of anticoagulants 02/01/2013  . Pain, limb, left-Leg 01/21/2013  . Aftercare following surgery of the circulatory system, [place] 01/12/2013  . PVD (peripheral vascular disease) 12/22/2012  . Swelling of limb 10/07/2012  . Subclavian artery stenosis  09/08/2012  . Peripheral vascular disease, unspecified 08/18/2012  . Fever 07/08/2012  . Leg edema 06/02/2012  . Cardiomyopathy, ischemic 05/18/2012  . S/P ICD (internal cardiac defibrillator) procedure 05/18/2012  . Diabetes 05/18/2012  . Pain in limb 02/07/2012  . Atherosclerosis of native arteries of the extremities with intermittent claudication 12/17/2011  . Atherosclerotic PVD with intermittent claudication 10/15/2011  . Type I (juvenile type) diabetes mellitus with peripheral circulatory disorders, uncontrolled 09/06/2011  . PAD (peripheral artery disease) 09/04/2011  . CAD (coronary artery disease)   . Burning sensation of feet 05/10/2011  . Bursitis of right hip 05/10/2011  . Cervicalgia 05/10/2011  . Balance disorder 05/10/2011  . Neurogenic claudication 05/10/2011  . Lumbago 05/10/2011  . Chronic systolic congestive heart failure 07/06/2010  . ECZEMA 01/17/2010  . RENAL INSUFFICIENCY 11/01/2009  . UTI 11/01/2009  . CELLULITIS, LEG, RIGHT 08/22/2009  . LYMPHADENOPATHY, REACTIVE 08/22/2009  . PERIPHERAL NEUROPATHY 01/24/2009  . DIZZINESS 12/29/2008  . ABDOMINAL PAIN, GENERALIZED 08/22/2008  . Gout 09/18/2007  . CORONARY ARTERY DISEASE 09/18/2007  . HYPERLIPIDEMIA 01/23/2007  . HYPERTENSION 01/23/2007  . Atrial fibrillation 01/23/2007  . BENIGN PROSTATIC HYPERTROPHY 01/23/2007  . LATERAL EPICONDYLITIS 01/23/2007            All PT delivered under supervision of Licensed Physical Therapist.   Blima Rich, Student PT 01/24/2014, 10:39 AM     This entire session was performed under direct supervision and direction of a licensed therapist/therapist assistant . I have personally read, edited and approve of the note as written.  Jamey Reas, PT, DPT Physical Therapist Specializing in Prosthetics & Limb Loss Care Phone: [phone] FAX: [phone] [address] [place], [place] [address]

## 2014-01-24 NOTE — Patient Instructions (Addendum)
Information for patients with Gout  Gout defined-Gout occurs when urate crystals accumulate in your joint causing the inflammation and intense pain of gout attack.  Urate crystals can form when you have high levels of uric acid in your blood.  Your body produces uric acid when it breaks down prurines-substances that are found naturally in your body, as well as in certain foods such as organ meats, anchioves, herring, asparagus, and mushrooms.  Normally uric acid dissolves in your blood and passes through your kidneys into your urine.  But sometimes your body either produces too much uric acid or your kidneys excrete too little uric acid.  When this happens, uric acid can build up, forming sharp needle-like urate crystals in a joint or surrounding tissue that cause pain, inflammation and swelling.    Gout is characterized by sudden, severe attacks of pain, redness and tenderness in joints, often the joint at the base of the big toe.  Gout is complex form of arthritis that can affect anyone.  Men are more likely to get gout but women become increasingly more susceptible to gout after menopause.  An acute attack of gout can wake you up in the middle of the night with the sensation that your big toe is on fire.  The affected joint is hot, swollen and so tender that even the weight or the sheet on it may seem intolerable.  If you experience symptoms of an acute gout attack it is important to your doctor as soon as the symptoms start.  Gout that goes untreated can lead to worsening pain and joint damage.  Risk Factors:  You are more likely to develop gout if you have high levels of uric acid in your body.    Factors that increase the uric acid level in your body include:  Lifestyle factors.  Excessive alcohol use-generally more than two drinks a day for men and more than one for women increase the risk of gout.  Medical conditions.  Certain conditions make it more likely that you will develop gout.   These include hypertension, and chronic conditions such as diabetes, high levels of fat and cholesterol in the blood, and narrowing of the arteries.  Certain medications.  The uses of Thiazide diuretics- commonly used to treat hypertension and low dose aspirin can also increase uric acid levels.  Family history of gout.  If other members of your family have had gout, you are more likely to develop the disease.  Age and sex. Gout occurs more often in men than it does in women, primarily because women tend to have lower uric acid levels than men do.  Men are more likely to develop gout earlier usually between the ages of 40-50- whereas women generally develop signs and symptoms after menopause.    Tests and diagnosis:  Tests to help diagnose gout may include:  Blood test.  Your doctor may recommend a blood test to measure the uric acid level in your blood .  Blood tests can be misleading, though.  Some people have high uric acid levels but never experience gout.  And some people have signs and symptoms of gout, but don't have unusual levels of uric acid in their blood.  Joint fluid test.  Your doctor may use a needle to draw fluid from your affected joint.  When examined under the microscope, your joint fluid may reveal urate crystals.  Treatment:  Treatment for gout usually involves medications.  What medications you and your doctor choose will be   based on your current health and other medications you currently take.  Gout medications can be used to treat acute gout attacks and prevent future attacks as well as reduce your risk of complications from gout such as the development of tophi from urate crystal deposits.  Alternative medicine:   Certain foods have been studied for their potential to lower uric acid levels, including:  Coffee.  Studies have found an association between coffee drinking (regular and decaf) and lower uric acid levels.  The evidence is not enough to encourage  non-coffee drinkers to start, but it may give clues to new ways of treating gout in the future.  Vitamin C.  Supplements containing vitamin C may reduce the levels of uric acid in your blood.  However, vitamin as a treatment for gout. Don't assume that if a little vitamin C is good, than lots is better.  Megadoses of vitamin C may increase your bodies uric acid levels.  Cherries.  Cherries have been associated with lower levels of uric acid in studies, but it isn't clear if they have any effect on gout signs and symptoms.  Eating more cherries and other dar-colored fruits, such as blackberries, blueberries, purple grapes and raspberries, may be a safe way to support your gout treatment.    Lifestyle/Diet Recommendations:   Drink 8 to 16 cups ( about 2 to 4 liters) of fluid each day, with at least half being water.  Avoid alcohol  Eat a moderate amount of protein, preferably from healthy sources, such as low-fat or fat-free dairy, tofu, eggs, and nut butters.  Limit you daily intake of meat, fish, and poultry to 4 to 6 ounces.  Avoid high fat meats and desserts.  Decrease you intake of shellfish, beef, lamb, pork, eggs and cheese.  Choose a good source of vitamin C daily such as citrus fruits, strawberries, broccoli,  brussel sprouts, papaya, and cantaloupe.   Choose a good source of vitamin A every other day such as yellow fruits, or dark green/yellow vegetables.  Avoid drastic weigh reduction or fasting.  If weigh loss is desired lose it over a period of several months.  See "dietary considerations.." chart for specific food recommendations.  Dietary Considerations for people with Gout  Food with negligible purine content (0-15 mg of purine nitrogen per 100 grams food)  May use as desired except on calorie variations  Non fat milk Cocoa Cereals (except in list II) Hard candies  Buttermilk Carbonated drinks Vegetables (except in list II) Sherbet  Coffee Fruits Sugar Honey  Tea  Cottage Cheese Gelatin-jell-o Salt  Fruit juice Breads Angel food Cake   Herbs/spices Jams/Jellies Carnation Instant Breakfast    Foods that do not contain excessive purine content, but must be limited due to fat content  Cream Eggs Oil and Salad Dressing  Half and Half Peanut Butter Chocolate  Whole Milk Cakes Potato Chips  Butter Ice Cream Fried Foods  Cheese Nuts Waffles, pancakes   List II: Food with moderate purine content (50-150 mg of purine nitrogen per 100 grams of food)  Limit total amount each day to 5 oz. cooked Lean meat, other than those on list III   Poultry, other than those on list III Fish, other than those on list III   Seafood, other than those on list III  These foods may be used occasionally  Peas Lentils Bran  Spinach Oatmeal Dried Beans and Peas  Asparagus Wheat Germ Mushrooms   Additional information about meat choices  Choose fish   and poultry, particularly without skin, often.  Select lean, well trimmed cuts of meat.  Avoid all fatty meats, bacon , sausage, fried meats, fried fish, or poultry, luncheon meats, cold cuts, hot dogs, meats canned or frozen in gravy, spareribs and frozen and packaged prepared meats.   List III: Foods with HIGH purine content / Foods to AVOID (150-800 mg of purine nitrogen per 100 grams of food)  Anchovies Herring Meat Broths  Liver Mackerel Meat Extracts  Kidney Scallops Meat Drippings  Sardines Wild Game Mincemeat  Sweetbreads Goose Gravy  Heart Tongue Yeast, baker's and brewers   Commercial soups made with any of the foods listed in List II or List III  In addition avoid all alcoholic beveragesGout Gout is an inflammatory arthritis caused by a buildup of uric acid crystals in the joints. Uric acid is a chemical that is normally present in the blood. When the level of uric acid in the blood is too high it can form crystals that deposit in your joints and tissues. This causes joint redness, soreness, and swelling  (inflammation). Repeat attacks are common. Over time, uric acid crystals can form into masses (tophi) near a joint, destroying bone and causing disfigurement. Gout is treatable and often preventable. CAUSES  The disease begins with elevated levels of uric acid in the blood. Uric acid is produced by your body when it breaks down a naturally found substance called purines. Certain foods you eat, such as meats and fish, contain high amounts of purines. Causes of an elevated uric acid level include:  Being passed down from parent to child (heredity).  Diseases that cause increased uric acid production (such as obesity, psoriasis, and certain cancers).  Excessive alcohol use.  Diet, especially diets rich in meat and seafood.  Medicines, including certain cancer-fighting medicines (chemotherapy), water pills (diuretics), and aspirin.  Chronic kidney disease. The kidneys are no longer able to remove uric acid well.  Problems with metabolism. Conditions strongly associated with gout include: 11. Obesity. 12. High blood pressure. 13. High cholesterol. 14. Diabetes. Not everyone with elevated uric acid levels gets gout. It is not understood why some people get gout and others do not. Surgery, joint injury, and eating too much of certain foods are some of the factors that can lead to gout attacks. SYMPTOMS   An attack of gout comes on quickly. It causes intense pain with redness, swelling, and warmth in a joint.  Fever can occur.  Often, only one joint is involved. Certain joints are more commonly involved:  Base of the big toe.  Knee.  Ankle.  Wrist.  Finger. Without treatment, an attack usually goes away in a few days to weeks. Between attacks, you usually will not have symptoms, which is different from many other forms of arthritis. DIAGNOSIS  Your caregiver will suspect gout based on your symptoms and exam. In some cases, tests may be recommended. The tests may include:  Blood  tests.  Urine tests.  X-rays.  Joint fluid exam. This exam requires a needle to remove fluid from the joint (arthrocentesis). Using a microscope, gout is confirmed when uric acid crystals are seen in the joint fluid. TREATMENT  There are two phases to gout treatment: treating the sudden onset (acute) attack and preventing attacks (prophylaxis).  Treatment of an Acute Attack.  Medicines are used. These include anti-inflammatory medicines or steroid medicines.  An injection of steroid medicine into the affected joint is sometimes necessary.  The painful joint is rested. Movement can worsen  the arthritis.  You may use warm or cold treatments on painful joints, depending which works best for you.  Treatment to Prevent Attacks.  If you suffer from frequent gout attacks, your caregiver may advise preventive medicine. These medicines are started after the acute attack subsides. These medicines either help your kidneys eliminate uric acid from your body or decrease your uric acid production. You may need to stay on these medicines for a very long time.  The early phase of treatment with preventive medicine can be associated with an increase in acute gout attacks. For this reason, during the first few months of treatment, your caregiver may also advise you to take medicines usually used for acute gout treatment. Be sure you understand your caregiver's directions. Your caregiver may make several adjustments to your medicine dose before these medicines are effective.  Discuss dietary treatment with your caregiver or dietitian. Alcohol and drinks high in sugar and fructose and foods such as meat, poultry, and seafood can increase uric acid levels. Your caregiver or dietitian can advise you on drinks and foods that should be limited. HOME CARE INSTRUCTIONS   Do not take aspirin to relieve pain. This raises uric acid levels.  Only take over-the-counter or prescription medicines for pain, discomfort,  or fever as directed by your caregiver.  Rest the joint as much as possible. When in bed, keep sheets and blankets off painful areas.  Keep the affected joint raised (elevated).  Apply warm or cold treatments to painful joints. Use of warm or cold treatments depends on which works best for you.  Use crutches if the painful joint is in your leg.  Drink enough fluids to keep your urine clear or pale yellow. This helps your body get rid of uric acid. Limit alcohol, sugary drinks, and fructose drinks.  Follow your dietary instructions. Pay careful attention to the amount of protein you eat. Your daily diet should emphasize fruits, vegetables, whole grains, and fat-free or low-fat milk products. Discuss the use of coffee, vitamin C, and cherries with your caregiver or dietitian. These may be helpful in lowering uric acid levels.  Maintain a healthy body weight. SEEK MEDICAL CARE IF:   You develop diarrhea, vomiting, or any side effects from medicines.  You do not feel better in 24 hours, or you are getting worse. SEEK IMMEDIATE MEDICAL CARE IF:   Your joint becomes suddenly more tender, and you have chills or a fever. MAKE SURE YOU:   Understand these instructions.  Will watch your condition.  Will get help right away if you are not doing well or get worse. Document Released: 02/16/2000 Document Revised: 07/05/2013 Document Reviewed: 10/02/2011 [facility] Patient Information 2015 [place], [place]. This information is not intended to replace advice given to you by your health care provider. Make sure you discuss any questions you have with your health care provider.   Do each exercise 1-2  times per day Do each exercise 10-15 repetitions Hold each exercise for 2 seconds to feel your location  AT [place].  USE TAPE ON FLOOR TO MARK THE MIDLINE POSITION. You also should try to feel with your limb pressure in socket.   You are trying to feel with limb what you used to feel with the bottom of your foot.  1. Side to Side Shift: Moving your hips only (not shoulders): move weight onto your left leg, HOLD/FEEL.  Move back to equal weight on each  leg, HOLD/FEEL. Move weight onto your right leg, HOLD/FEEL. Move back to equal weight on each leg, HOLD/FEEL. Repeat. 2. Front to Back Shift: Moving your hips only (not shoulders): move your weight forward onto your toes, HOLD/FEEL. Move your weight back to equal Flat Foot on both legs, HOLD/FEEL. Move your weight back onto your heels, HOLD/FEEL. Move your weight back to equal on both legs, HOLD/FEEL. Repeat. 3. Moving Cones / Cups: With equal weight on each leg: Hold on with one hand the first time, then progress to no hand supports. Move cups from one side of sink to the other. Place cups ~2" out of your reach, progress to 10" beyond reach. 4. Overhead/Upward Reaching: alternated reaching up to top cabinets or ceiling if no cabinets present. Keep equal weight on each leg. Start with one hand support on counter while other hand reaches and progress to no hand support with reaching. 5.   Looking Over Shoulders: With equal weight on each leg: alternate turning to look right & left over your shoulders with one hand support on counter as needed. Shift weight to right & left side looking, pull hip then shoulder then head/eyes around to look behind you. Start with one hand support & progress to no hand support.

## 2014-01-25 ENCOUNTER — Ambulatory Visit: Payer: Medicare Other | Admitting: Physical Therapy

## 2014-01-25 ENCOUNTER — Encounter: Payer: Self-pay | Admitting: Physical Therapy

## 2014-01-25 DIAGNOSIS — R6889 Other general symptoms and signs: Secondary | ICD-10-CM

## 2014-01-25 DIAGNOSIS — R269 Unspecified abnormalities of gait and mobility: Secondary | ICD-10-CM

## 2014-01-25 DIAGNOSIS — Z89612 Acquired absence of left leg above knee: Secondary | ICD-10-CM | POA: Diagnosis not present

## 2014-01-25 NOTE — Therapy (Signed)
Physical Therapy Treatment  Patient Details  Name: [name] MRN: [id number] Date of Birth: 05-21-1934  Encounter Date: 01/25/2014      PT End of Session - 01/25/14 1511    Visit Number [id number]   Number of Visits 44   Date for PT Re-Evaluation 02/04/14   PT Start Time 0930   PT Stop Time 1013   PT Time Calculation (min) 43 min   Equipment Utilized During Treatment Gait belt   Activity Tolerance Patient tolerated treatment well;Patient limited by fatigue   Behavior During Therapy [facility] for tasks assessed/performed      Past Medical History  Diagnosis Date  . Gout   . Benign prostatic hypertrophy     takes Proscar daily  . Atrial fibrillation     takes Warfarin daily  . Chronic systolic dysfunction of left ventricle     a. mixed ischemic and nonischemic CM,  EF 35%. b. s/p AICD implantation.  . ED (erectile dysfunction)   . Arthritis   . Pacemaker     medtronic  . CAD (coronary artery disease)     a. s/p mid LAD stenting with DES 2008 Dr. Daneen Schick  . ICD (implantable cardiac defibrillator) in place     medtronic, Dr. Rayann Heman  . ICD (implantable cardiac defibrillator) in place   . Sleep apnea     hx of "had surgery for"  . Automatic implantable cardioverter-defibrillator in situ   . Depression   . Numbness and tingling     Hx; 31fleft foot  . PAD (peripheral artery disease)     Severe by PV angiogram 09/2011  . Renal artery stenosis     s/p stenting 2009  . PAD (peripheral artery disease)     s/p multiple LLE bypass grafts; left mid-distal SCA occlusion by 08/2012 duplex  . Hypertension     takes Carvedilol and Losartan daily  . Constipation     takes Miralax daily as needed and Colace daily   . Type II diabetes mellitus     takes Novolog 70/30  . CHF (congestive heart failure)     takes Lasix daily  . History of MRSA infection   . Insomnia     TAKES TRAZODONE NIGHTLY    Past Surgical History  Procedure Laterality Date  . Turp vaporization    . Cardiac  defibrillator placement  12/26/09    pacemaker combo  . Cervical epidural injection  2013  . Femoral-tibial bypass graft  09/25/2011    Procedure: BYPASS GRAFT FEMORAL-TIBIAL ARTERY;  Surgeon: JMal Misty MD;  [facility];  Service: Vascular;  Laterality: Left;  Left Femoral - Anterior Tibial Bypass;  saphenous vein graft from left leg  . Intraoperative arteriogram  09/25/2011    Procedure: INTRA OPERATIVE ARTERIOGRAM;  Surgeon: JMal Misty MD;  [facility]  Service: Vascular;  Laterality: Left;  . Femoral-tibial bypass graft  02/07/2012    Procedure: BYPASS GRAFT FEMORAL-TIBIAL ARTERY;  Surgeon: TRosetta Posner MD;  [facility]  Service: Vascular;  Laterality: Left;  Thrombectomy Left Femoral - Tibial Bypass Graft  . Embolectomy  02/07/2012    Procedure: EMBOLECTOMY;  Surgeon: TRosetta Posner MD;  [place] [facility]  Service: Vascular;  Laterality: Left;  . Femoral-tibial bypass graft  04/03/2012    Procedure: BYPASS GRAFT FEMORAL-TIBIAL ARTERY;  Surgeon: JMal Misty MD;  [place] [facility]  Service: Vascular;  Laterality: Left;  Redo  . Insert / replace / remove pacemaker  2007  . Coronary angioplasty with stent placement  ~ 2000  . Coronary angioplasty    . Uvulopalatopharyngoplasty (uppp)/tonsillectomy/septoplasty  06/30/2003    Archie Endo 06/30/2003 (07/10/2012)  . Shoulder open rotator cuff repair Right 2001    repair of lacerated right/notes 10/11/1999  (07/10/2012)  . Foot surgery Right 03/20/2001    "have plates and screws in; didn't break it" (07/10/2012)  . Carpal tunnel release Right 2002    Archie Endo 03/20/2001 (07/10/2012)  . Biceps tendon repair Right 2001    Archie Endo 03/20/2001 (07/10/2012)  . Renal artery stent  2009  . Femoral-popliteal bypass graft Left 09/16/2012    Procedure: LEFT FEMORAL-POPLITEAL BYPASS GRAFT WITH GORTEX Propaten Graft 6x80 Thin Wall and Left lower leg Angiogram;  Surgeon: Mal Misty, MD;  Location: [place];  Service: Vascular;  Laterality: Left;  .  Colonoscopy      Hx; of  . Tonsillectomy    . Adenoidectomy      Hx; of   . Femoral-tibial bypass graft Left 09/30/2012    Procedure: REDO LEFT FEMORAL-ANTERIOR TIBIAL ARTERY BYPASS USING COMPOSITE CEPHALIC AND BASILIC VEIN GRAFT FROM LEFT ARM;  Surgeon: Mal Misty, MD;  Location: [place];  Service: Vascular;  Laterality: Left;  . I&d extremity Left 10/21/2012    Procedure: EXPLORATION AND DEBRIDEMENT OF LEFT GROIN WOUND;  Surgeon: Mal Misty, MD;  Location: [place];  Service: Vascular;  Laterality: Left;  . Patch angioplasty Left 10/21/2012    Procedure: PATCH ANGIOPLASTY;  Surgeon: Mal Misty, MD;  Location: [place];  Service: Vascular;  Laterality: Left;  . Groin debridement Left 11/12/2012    Procedure: CLOSURE INGUINAL WOUND;  Surgeon: Mal Misty, MD;  Location: [place];  Service: Vascular;  Laterality: Left;  . Embolectomy Left 12/16/2012    Procedure: THROMBECTOMY  LEFT LEG BYPASS;  Surgeon: Elam Dutch, MD;  Location: [place];  Service: Vascular;  Laterality: Left;  . Leg amputation above knee Left 02/04/2013    DR LAWSON  . Amputation Left 02/04/2013    Procedure: AMPUTATION ABOVE KNEE-LEFT;  Surgeon: Mal Misty, MD;  Location: [place];  Service: Vascular;  Laterality: Left;  . Removal of graft Left 04/16/2013    Procedure: I & D LEFT AKA WOUND, POSSIBLE REMOVAL OF INFECTED GORTEX GRAFT;  Surgeon: Mal Misty, MD;  Location: [place];  Service: Vascular;  Laterality: Left;    There were no vitals taken for this visit.  Visit Diagnosis:  Abnormality of gait  Activity intolerance      Subjective Assessment - 01/25/14 0936    Symptoms Still with gout pain, not sleeping well. Reports no falls. Reports he did read over the information on gout, has not put any into place as yet.    Currently in Pain? Yes   Pain Score 8    Pain Location Hand  thumbs mostly   Pain Orientation Right;Left   Pain Descriptors / Indicators Aching;Tightness;Throbbing   Pain Type Acute pain    Pain Onset 1 to 4 weeks ago   Pain Frequency Constant  at this time until gout clears   Aggravating Factors  pressure on hands   Pain Relieving Factors medication            [facility] Adult PT Treatment/Exercise - 01/25/14 0948    Transfers   Sit to Stand 5: Supervision;With upper extremity assist;With armrests;From chair/3-in-1   Sit to Stand Details (indicate cue type and reason) cues to scoot forward and for  bil foot placement with preparing to stand.                     Stand to Sit 5: Supervision;With upper extremity assist;With armrests;To chair/3-in-1   Stand to Sit Details cues to back/turn completly to surface before reaching back to sit down.   Ambulation/Gait   Ambulation/Gait Yes   Ambulation/Gait Assistance 5: Supervision;4: Min guard   Ambulation/Gait Assistance Details cues on posture, increased prosthetic weight bearing with decreased UE weight bearing on walker and to increase right step length with increased left stance time.                  Ambulation Distance (Feet) 120 Feet  x1, 147f x1, 935fx1, 15058f1   Assistive device Rolling walker   Gait Pattern Step-through pattern;Decreased stride length;Trunk flexed;Narrow base of support;Decreased stance time - left;Decreased step length - right;Decreased weight shift to left   Gait velocity decreased   Stairs Yes   Stairs Assistance 4: Min guard   Stairs Assistance Details (indicate cue type and reason) reminder cues on sequence and technique. cues/facilitation for increased anterior weight shift onto prosthesis with descending stairs to decrease hip flexion as well.                   Stair Management Technique Two rails;Step to pattern;Forwards   Number of Stairs 4   Ramp 4: Min assist   Ramp Details (indicate cue type and reason) cues on sequence and techique with RW and prosthesis              Curb --  min guard assist   Curb Details (indicate cue type and reason) cues on sequence and techique with RW and  prosthesis   Prosthetics   Current prosthetic wear tolerance (days/week)  7   Current prosthetic wear tolerance (#hours/day)  8   Residual limb condition  intact per pt   Donning Prosthesis Independent   Doffing Prosthesis Independent          PT Education - 01/24/14 1009    Education provided Yes   Education Details gout, consulting pharmicist regarding medication concerns, HEP   Person(s) Educated Patient   Methods Explanation;Demonstration;Handout   Comprehension Verbalized understanding;Returned demonstration          PT Short Term Goals - 01/25/14 1514    PT SHORT TERM GOAL #1   Title ambulates 150' with crutches & prosthesis with supervision.   Baseline Patient ambulated 125' with crutches & prosthesis with minimal gaurd assist.   Status Not Met   PT SHORT TERM GOAL #2   Title ambulate 50'51ith single point cane & prosthesis with supervision   Baseline Patient ambulated 15'66ith prosthetic knee unlocked with maximal assist / hand hold & 15' with prosthetic knee locked with moderate assist /hand hold.   Status Not Met   PT SHORT TERM GOAL #3   Title negotiate ramp & curb with crutches & prosthesis with minimal assist   Baseline Patient negotiated ramp & curb with crutches with minimal assist for curb & moderate assist on ramp with constant cueing.   Status Not Met   PT SHORT TERM GOAL #4   Title reaches 5" without UE support safely with supervision.   Baseline reaches 2" without UE support with minimal assist.   Status Not Met          PT Long Term Goals - 01/25/14 1514    PT LONG TERM GOAL #  1   Title Patient demonstrates & verbalizes proper prosthetic care to enable safe utilization of the prosthesis. 02/04/2014   Status On-going   PT LONG TERM GOAL #2   Title tolerates wear of prosthesis >90% of awake hours without change in skin integrity nor undue tenderness. 02/04/2014   Status On-going   PT LONG TERM GOAL #3   Title ambulates 200' with rolling walker &  prosthesis modified independent. 02/04/2014   Status Revised   PT LONG TERM GOAL #4   Title ambulate 50' on uneven (grass) surfaces with rolling walker & prosthesis modified independent. 12/04/205   Status Revised   PT LONG TERM GOAL #5   Title negotiate ramp, curb & stairs (1 rail) with rolling walker & prosthesis modified independent. 02/04/2014   Status Revised   PT LONG TERM GOAL #6   Title perform standing activities for >10 minutes with prosthesis with pain /discomfort </= 2/10 increments on 0-10 scale. 02/04/2014   Status On-going   PT LONG TERM GOAL #7   Title Berg Balance Test score with prosthesis >/= 30/56. 02/04/2014   Status On-going   PT LONG TERM GOAL #8   Title self-report via FOTO improvement >10 points in Functional Status Measure. 02/04/2014   Status On-going   PT LONG TERM GOAL  #9   TITLE ambulates 50' carrying cup or plate with cane & prosthesis modified independent. 02/04/2014   Status Deferred          Plan - 01/25/14 1511    Clinical Impression Statement Pt making slow, steady progress with mobiliy. Discussed ways to increased his activity out of therapy with pt and visiting familiy (son and grand-daughter).   Pt will benefit from skilled therapeutic intervention in order to improve on the following deficits Abnormal gait;Decreased activity tolerance;Decreased balance;Decreased endurance;Decreased knowledge of use of DME;Decreased strength;Decreased range of motion;Pain;Difficulty walking;Decreased mobility  prosthetic dependency   Rehab Potential Good   PT Frequency 2x / week   PT Duration 8 weeks   PT Treatment/Interventions DME Instruction;Gait training;Stair training;Functional mobility training;Therapeutic exercise;Balance training;Neuromuscular re-education;Therapeutic activities;Patient/family education;Other (comment);Energy conservation  prosthetic training   PT Next Visit Plan Continue to progress activity as able, assess LTG's next week.   PT  Home Exercise Plan Sink exercises. Instructed to increase ambulation in home with prosthesis and RW.   Consulted and Agree with Plan of Care Patient;Family member/caregiver   Family Member Consulted son/grand-daughter        Problem List Patient Active Problem List   Diagnosis Date Noted  . Wound disruption, post-op, skin 05/28/2013  . Encounter for therapeutic drug monitoring 04/22/2013  . Wound infection 04/16/2013  . Chronic infection of amputation stump 04/13/2013  . Phantom limb pain 03/24/2013  . Wound drainage 03/23/2013  . Encounter for staple removal 03/09/2013  . Acute blood loss anemia 02/10/2013  . Chronic kidney disease 02/10/2013  . Unilateral AKA 02/09/2013  . Nontraumatic ischemic infarction of muscle of lower leg 02/04/2013  . Long term (current) use of anticoagulants 02/01/2013  . Pain, limb, left-Leg 01/21/2013  . Aftercare following surgery of the circulatory system, [place] 01/12/2013  . PVD (peripheral vascular disease) 12/22/2012  . Swelling of limb 10/07/2012  . Subclavian artery stenosis 09/08/2012  . Peripheral vascular disease, unspecified 08/18/2012  . Fever 07/08/2012  . Leg edema 06/02/2012  . Cardiomyopathy, ischemic 05/18/2012  . S/P ICD (internal cardiac defibrillator) procedure 05/18/2012  . Diabetes 05/18/2012  . Pain in limb 02/07/2012  . Atherosclerosis of native arteries of the  extremities with intermittent claudication 12/17/2011  . Atherosclerotic PVD with intermittent claudication 10/15/2011  . Type I (juvenile type) diabetes mellitus with peripheral circulatory disorders, uncontrolled 09/06/2011  . PAD (peripheral artery disease) 09/04/2011  . CAD (coronary artery disease)   . Burning sensation of feet 05/10/2011  . Bursitis of right hip 05/10/2011  . Cervicalgia 05/10/2011  . Balance disorder 05/10/2011  . Neurogenic claudication 05/10/2011  . Lumbago 05/10/2011  . Chronic systolic congestive heart failure 07/06/2010  . ECZEMA  01/17/2010  . RENAL INSUFFICIENCY 11/01/2009  . UTI 11/01/2009  . CELLULITIS, LEG, RIGHT 08/22/2009  . LYMPHADENOPATHY, REACTIVE 08/22/2009  . PERIPHERAL NEUROPATHY 01/24/2009  . DIZZINESS 12/29/2008  . ABDOMINAL PAIN, GENERALIZED 08/22/2008  . Gout 09/18/2007  . CORONARY ARTERY DISEASE 09/18/2007  . HYPERLIPIDEMIA 01/23/2007  . HYPERTENSION 01/23/2007  . Atrial fibrillation 01/23/2007  . BENIGN PROSTATIC HYPERTROPHY 01/23/2007  . LATERAL EPICONDYLITIS 01/23/2007         Willow Ora 01/25/2014, 3:15 PM   Willow Ora, PTA, [facility] [address], [place], [place] [address] [phone] 01/25/2014, 3:16 PM

## 2014-02-01 ENCOUNTER — Ambulatory Visit: Payer: Medicare Other | Attending: Physical Medicine & Rehabilitation | Admitting: Physical Therapy

## 2014-02-01 ENCOUNTER — Encounter: Payer: Self-pay | Admitting: Physical Therapy

## 2014-02-01 DIAGNOSIS — I999 Unspecified disorder of circulatory system: Secondary | ICD-10-CM

## 2014-02-01 DIAGNOSIS — Z89612 Acquired absence of left leg above knee: Secondary | ICD-10-CM | POA: Diagnosis not present

## 2014-02-01 DIAGNOSIS — R6889 Other general symptoms and signs: Secondary | ICD-10-CM

## 2014-02-01 DIAGNOSIS — R5381 Other malaise: Secondary | ICD-10-CM | POA: Insufficient documentation

## 2014-02-01 DIAGNOSIS — I998 Other disorder of circulatory system: Secondary | ICD-10-CM

## 2014-02-01 DIAGNOSIS — R269 Unspecified abnormalities of gait and mobility: Secondary | ICD-10-CM | POA: Diagnosis present

## 2014-02-01 DIAGNOSIS — M79672 Pain in left foot: Secondary | ICD-10-CM

## 2014-02-01 NOTE — Therapy (Signed)
Physical Therapy Treatment  Patient Details  Name: [name] MRN: [id number] Date of Birth: [birth year]  Encounter Date: 02/01/2014      PT End of Session - 02/01/14 1138    Visit Number 43   Number of Visits 44   Date for PT Re-Evaluation 02/04/14   PT Start Time 0936   PT Stop Time 1017   PT Time Calculation (min) 41 min   Equipment Utilized During Treatment Gait belt   Activity Tolerance Patient tolerated treatment well;Patient limited by fatigue   Behavior During Therapy [facility] for tasks assessed/performed      Past Medical History  Diagnosis Date  . Gout   . Benign prostatic hypertrophy     takes Proscar daily  . Atrial fibrillation     takes Warfarin daily  . Chronic systolic dysfunction of left ventricle     a. mixed ischemic and nonischemic CM,  EF 35%. b. s/p AICD implantation.  . ED (erectile dysfunction)   . Arthritis   . Pacemaker     medtronic  . CAD (coronary artery disease)     a. s/p mid LAD stenting with DES 2008 Dr. Daneen Schick  . ICD (implantable cardiac defibrillator) in place     medtronic, Dr. Rayann Heman  . ICD (implantable cardiac defibrillator) in place   . Sleep apnea     hx of "had surgery for"  . Automatic implantable cardioverter-defibrillator in situ   . Depression   . Numbness and tingling     Hx; 60f left foot  . PAD (peripheral artery disease)     Severe by PV angiogram 09/2011  . Renal artery stenosis     s/p stenting 2009  . PAD (peripheral artery disease)     s/p multiple LLE bypass grafts; left mid-distal SCA occlusion by 08/2012 duplex  . Hypertension     takes Carvedilol and Losartan daily  . Constipation     takes Miralax daily as needed and Colace daily   . Type II diabetes mellitus     takes Novolog 70/30  . CHF (congestive heart failure)     takes Lasix daily  . History of MRSA infection   . Insomnia     TAKES TRAZODONE NIGHTLY    Past Surgical History  Procedure Laterality Date  . Turp vaporization    . Cardiac  defibrillator placement  12/26/09    pacemaker combo  . Cervical epidural injection  2013  . Femoral-tibial bypass graft  09/25/2011    Procedure: BYPASS GRAFT FEMORAL-TIBIAL ARTERY;  Surgeon: Mal Misty, MD;  Location: [facility] OR;  Service: Vascular;  Laterality: Left;  Left Femoral - Anterior Tibial Bypass;  saphenous vein graft from left leg  . Intraoperative arteriogram  09/25/2011    Procedure: INTRA OPERATIVE ARTERIOGRAM;  Surgeon: Mal Misty, MD;  Location: [place];  Service: Vascular;  Laterality: Left;  . Femoral-tibial bypass graft  02/07/2012    Procedure: BYPASS GRAFT FEMORAL-TIBIAL ARTERY;  Surgeon: Rosetta Posner, MD;  Location: [place];  Service: Vascular;  Laterality: Left;  Thrombectomy Left Femoral - Tibial Bypass Graft  . Embolectomy  02/07/2012    Procedure: EMBOLECTOMY;  Surgeon: Rosetta Posner, MD;  Location: [place];  Service: Vascular;  Laterality: Left;  . Femoral-tibial bypass graft  04/03/2012    Procedure: BYPASS GRAFT FEMORAL-TIBIAL ARTERY;  Surgeon: Mal Misty, MD;  Location: [place];  Service: Vascular;  Laterality: Left;  Redo  . Insert / replace / remove pacemaker  2007  . Coronary angioplasty with stent placement  ~ 2000  . Coronary angioplasty    . Uvulopalatopharyngoplasty (uppp)/tonsillectomy/septoplasty  06/30/2003    Archie Endo 06/30/2003 (07/10/2012)  . Shoulder open rotator cuff repair Right 2001    repair of lacerated right/notes 10/11/1999  (07/10/2012)  . Foot surgery Right 03/20/2001    "have plates and screws in; didn't break it" (07/10/2012)  . Carpal tunnel release Right 2002    Archie Endo 03/20/2001 (07/10/2012)  . Biceps tendon repair Right 2001    Archie Endo 03/20/2001 (07/10/2012)  . Renal artery stent  2009  . Femoral-popliteal bypass graft Left 09/16/2012    Procedure: LEFT FEMORAL-POPLITEAL BYPASS GRAFT WITH GORTEX Propaten Graft 6x80 Thin Wall and Left lower leg Angiogram;  Surgeon: Mal Misty, MD;  Location: [place];  Service: Vascular;  Laterality: Left;  .  Colonoscopy      Hx; of  . Tonsillectomy    . Adenoidectomy      Hx; of   . Femoral-tibial bypass graft Left 09/30/2012    Procedure: REDO LEFT FEMORAL-ANTERIOR TIBIAL ARTERY BYPASS USING COMPOSITE CEPHALIC AND BASILIC VEIN GRAFT FROM LEFT ARM;  Surgeon: Mal Misty, MD;  Location: [place];  Service: Vascular;  Laterality: Left;  . I&d extremity Left 10/21/2012    Procedure: EXPLORATION AND DEBRIDEMENT OF LEFT GROIN WOUND;  Surgeon: Mal Misty, MD;  Location: [place];  Service: Vascular;  Laterality: Left;  . Patch angioplasty Left 10/21/2012    Procedure: PATCH ANGIOPLASTY;  Surgeon: Mal Misty, MD;  Location: [place];  Service: Vascular;  Laterality: Left;  . Groin debridement Left 11/12/2012    Procedure: CLOSURE INGUINAL WOUND;  Surgeon: Mal Misty, MD;  Location: [place];  Service: Vascular;  Laterality: Left;  . Embolectomy Left 12/16/2012    Procedure: THROMBECTOMY  LEFT LEG BYPASS;  Surgeon: Elam Dutch, MD;  Location: [place];  Service: Vascular;  Laterality: Left;  . Leg amputation above knee Left 02/04/2013    DR LAWSON  . Amputation Left 02/04/2013    Procedure: AMPUTATION ABOVE KNEE-LEFT;  Surgeon: Mal Misty, MD;  Location: [place];  Service: Vascular;  Laterality: Left;  . Removal of graft Left 04/16/2013    Procedure: I & D LEFT AKA WOUND, POSSIBLE REMOVAL OF INFECTED GORTEX GRAFT;  Surgeon: Mal Misty, MD;  Location: [place];  Service: Vascular;  Laterality: Left;    There were no vitals taken for this visit.  Visit Diagnosis:  Abnormality of gait  Activity intolerance  Status post above knee amputation of left lower extremity  Ischemic pain of foot, left      Subjective Assessment - 02/01/14 0941    Symptoms Patient states prosthetist shortened prosthetic leg and adjusted knee processor settings. Gout pain 6/10   Currently in Pain? Yes   Pain Score 6    Pain Location --  Hand (thumb)   Pain Orientation Right;Left   Pain Descriptors / Indicators  Aching   Pain Type Acute pain   Pain Onset 1 to 4 weeks ago   Aggravating Factors  Weight bearing   Pain Relieving Factors Medication; ice   Effect of Pain on Daily Activities Limiting walking due to gout in thumbs.            [place] Adult PT Treatment/Exercise - 02/01/14 0930    Transfers   Transfers Sit to Stand;Stand to Sit   Sit to Stand 5: Supervision;With upper extremity assist;With armrests;From chair/3-in-1   Sit to  Stand Details (indicate cue type and reason) cues on weight shifting to gain balance without using RW in standing   Stand to Sit 5: Supervision   Stand to Sit Details cues on weight shifting to unlock the brace without UE support prior to sitting   Ambulation/Gait   Ambulation/Gait Yes   Ambulation/Gait Assistance 5: Supervision   Ambulation/Gait Assistance Details cues on step length, posture, and weight shifting. With alteration of prosthesis shortening, patient had increased confidence in LLE in single leg stance increasing step length on RLE and improved toe clearance on LLE. Patient has increased gait deviations when fatigued affecting step length on RLE.   Ambulation Distance (Feet) 134 Feet  x1   Assistive device Rolling walker  AKA prosthesis LLE   Gait Pattern Step-to pattern;Step-through pattern;Decreased step length - right;Decreased stance time - left   Gait velocity decreased   Standardized Balance Assessment   Standardized Balance Assessment Berg Balance Test   Berg Balance Test   Sit to Stand Able to stand  independently using hands   Standing Unsupported Able to stand 2 minutes with supervision   Sitting with Back Unsupported but Feet Supported on Floor or Stool Able to sit safely and securely 2 minutes   Stand to Sit Controls descent by using hands   Transfers Able to transfer with verbal cueing and /or supervision   Standing Unsupported with Eyes Closed Able to stand 10 seconds with supervision   Standing Ubsupported with Feet Together Needs  help to attain position but able to stand for 30 seconds with feet together   From Standing, Reach Forward with Outstretched Arm Reaches forward but needs supervision   From Standing Position, Pick up Object from Floor Able to pick up shoe, needs supervision   From Standing Position, Turn to Look Behind Over each Shoulder Looks behind one side only/other side shows less weight shift   Turn 360 Degrees Needs assistance while turning   Standing Unsupported, Alternately Place Feet on Step/Stool Needs assistance to keep from falling or unable to try   Standing Unsupported, One Foot in Front Able to take small step independently and hold 30 seconds   Standing on One Leg Able to lift leg independently and hold 5-10 seconds   Total Score 31   Prosthetics   Current prosthetic wear tolerance (days/week)  7   Current prosthetic wear tolerance (#hours/day)  8   Residual limb condition  intact per pt   Donning Prosthesis Independent   Doffing Prosthesis Independent          PT Education - 02/01/14 1138    Education provided Yes   Education Details HEP, importance of increasing activity outside of therapy to improve PT outcomes.   Person(s) Educated Patient   Methods Explanation   Comprehension Verbalized understanding;Need further instruction            PT Long Term Goals - 02/01/14 0930    PT LONG TERM GOAL #1   Title Patient demonstrates & verbalizes proper prosthetic care to enable safe utilization of the prosthesis. 02/04/2014   Status Achieved   PT LONG TERM GOAL #2   Title tolerates wear of prosthesis >90% of awake hours without change in skin integrity nor undue tenderness. 02/04/2014   Baseline Wearing 9 hours out of 15 hour awake time (60%)   Status On-going   PT LONG TERM GOAL #3   Title ambulates 200' with rolling walker & prosthesis modified independent. 02/04/2014   Baseline Pt ambulated 131ft with  rolling walker with supervision before requiring rest break.   Status  Revised   PT LONG TERM GOAL #4   Title ambulate 50' on uneven (grass) surfaces with rolling walker & prosthesis modified independent. 12/04/205   Status Revised   PT LONG TERM GOAL #5   Title negotiate ramp, curb & stairs (1 rail) with rolling walker & prosthesis modified independent. 02/04/2014   Status Revised   PT LONG TERM GOAL #6   Title perform standing activities for >10 minutes with prosthesis with pain /discomfort </= 2/10 increments on 0-10 scale. 02/04/2014   Baseline Able to tolerate 8 minutes of standing activity before requiring seated rest break.   Status On-going   PT LONG TERM GOAL #7   Title Berg Balance Test score with prosthesis >/= 30/56. 02/04/2014   Baseline Scored 31/56   Status Achieved   PT LONG TERM GOAL #8   Title self-report via FOTO improvement >10 points in Functional Status Measure. 02/04/2014   Status On-going   PT LONG TERM GOAL  #9   TITLE ambulates 50' carrying cup or plate with cane & prosthesis modified independent. 02/04/2014   Status Deferred          Plan - 02/01/14 1141    Clinical Impression Statement Patient demonstrated increase ambulation distance and overall balance by meeting Berg balance test long term goal. The patient is still limited in overall upright activity tolerance by endurance and current pain in BUE thumbs due to gout flare up. With prosthetic alterations, patient appeared to be more confident on LLE in single limb stance and demonstrated more symmetrical weight shifting.    Pt will benefit from skilled therapeutic intervention in order to improve on the following deficits Abnormal gait;Decreased activity tolerance;Decreased balance;Decreased endurance;Decreased knowledge of use of DME;Decreased strength;Decreased range of motion;Pain;Difficulty walking;Decreased mobility;Impaired flexibility   Rehab Potential Good   Clinical Impairments Affecting Rehab Potential Fear of falling   PT Frequency 2x / week   PT Duration 8 weeks    PT Treatment/Interventions DME Instruction;Gait training;Stair training;Functional mobility training;Therapeutic exercise;Balance training;Neuromuscular re-education;Therapeutic activities;Patient/family education;Other (comment);Energy conservation  prosthetic training   PT Next Visit Plan Assess remaining LTG's (FOTO, Outside ambulation, ramp/curb/stair, prosthetic wear time). Reiterate importance of activity outside of PT. Discharge from PT.   Consulted and Agree with Plan of Care Patient        Problem List Patient Active Problem List   Diagnosis Date Noted  . Wound disruption, post-op, skin 05/28/2013  . Encounter for therapeutic drug monitoring 04/22/2013  . Wound infection 04/16/2013  . Chronic infection of amputation stump 04/13/2013  . Phantom limb pain 03/24/2013  . Wound drainage 03/23/2013  . Encounter for staple removal 03/09/2013  . Acute blood loss anemia 02/10/2013  . Chronic kidney disease 02/10/2013  . Unilateral AKA 02/09/2013  . Nontraumatic ischemic infarction of muscle of lower leg 02/04/2013  . Long term (current) use of anticoagulants 02/01/2013  . Pain, limb, left-Leg 01/21/2013  . Aftercare following surgery of the circulatory system, [place] 01/12/2013  . PVD (peripheral vascular disease) 12/22/2012  . Swelling of limb 10/07/2012  . Subclavian artery stenosis 09/08/2012  . Peripheral vascular disease, unspecified 08/18/2012  . Fever 07/08/2012  . Leg edema 06/02/2012  . Cardiomyopathy, ischemic 05/18/2012  . S/P ICD (internal cardiac defibrillator) procedure 05/18/2012  . Diabetes 05/18/2012  . Pain in limb 02/07/2012  . Atherosclerosis of native arteries of the extremities with intermittent claudication 12/17/2011  . Atherosclerotic PVD with intermittent claudication 10/15/2011  .  Type I (juvenile type) diabetes mellitus with peripheral circulatory disorders, uncontrolled 09/06/2011  . PAD (peripheral artery disease) 09/04/2011  . CAD (coronary  artery disease)   . Burning sensation of feet 05/10/2011  . Bursitis of right hip 05/10/2011  . Cervicalgia 05/10/2011  . Balance disorder 05/10/2011  . Neurogenic claudication 05/10/2011  . Lumbago 05/10/2011  . Chronic systolic congestive heart failure 07/06/2010  . ECZEMA 01/17/2010  . RENAL INSUFFICIENCY 11/01/2009  . UTI 11/01/2009  . CELLULITIS, LEG, RIGHT 08/22/2009  . LYMPHADENOPATHY, REACTIVE 08/22/2009  . PERIPHERAL NEUROPATHY 01/24/2009  . DIZZINESS 12/29/2008  . ABDOMINAL PAIN, GENERALIZED 08/22/2008  . Gout 09/18/2007  . CORONARY ARTERY DISEASE 09/18/2007  . HYPERLIPIDEMIA 01/23/2007  . HYPERTENSION 01/23/2007  . Atrial fibrillation 01/23/2007  . BENIGN PROSTATIC HYPERTROPHY 01/23/2007  . LATERAL EPICONDYLITIS 01/23/2007        All PT delivered under supervision of Licensed Physical Therapist.   Blima Rich, Student PT 02/01/2014, 12:53 PM    This entire session was performed under direct supervision and direction of a licensed therapist/therapist assistant . I have personally read, edited and approve of the note as written.  Jamey Reas, PT, DPT PT Specializing in [place] 02/01/2014 1:58 PM Phone:  [phone]  Fax:  [phone] [place] [address] [place], [place] [address]

## 2014-02-03 ENCOUNTER — Encounter: Payer: Self-pay | Admitting: Physical Therapy

## 2014-02-03 ENCOUNTER — Ambulatory Visit: Payer: Medicare Other | Admitting: Physical Therapy

## 2014-02-03 DIAGNOSIS — R269 Unspecified abnormalities of gait and mobility: Secondary | ICD-10-CM

## 2014-02-03 DIAGNOSIS — R6889 Other general symptoms and signs: Secondary | ICD-10-CM

## 2014-02-03 DIAGNOSIS — Z89612 Acquired absence of left leg above knee: Secondary | ICD-10-CM | POA: Diagnosis not present

## 2014-02-03 NOTE — Therapy (Signed)
[facility] [address] [place], [place], [address] Phone: [phone]   Fax:  [phone]  Physical Therapy Treatment  Patient Details  Name: [name] MRN: [id number] Date of Birth: March 05, 1934  Encounter Date: 02/03/2014      PT End of Session - 02/03/14 1128    Visit Number 13   Number of Visits 44   Date for PT Re-Evaluation 02/04/14   PT Start Time 0933   PT Stop Time 1015   PT Time Calculation (min) 42 min   Equipment Utilized During Treatment Gait belt   Activity Tolerance Patient tolerated treatment well;Patient limited by fatigue   Behavior During Therapy [facility] for tasks assessed/performed      Past Medical History  Diagnosis Date  . Gout   . Benign prostatic hypertrophy     takes Proscar daily  . Atrial fibrillation     takes Warfarin daily  . Chronic systolic dysfunction of left ventricle     a. mixed ischemic and nonischemic CM,  EF 35%. b. s/p AICD implantation.  . ED (erectile dysfunction)   . Arthritis   . Pacemaker     medtronic  . CAD (coronary artery disease)     a. s/p mid LAD stenting with DES 2008 Dr. Daneen Schick  . ICD (implantable cardiac defibrillator) in place     medtronic, Dr. Rayann Heman  . ICD (implantable cardiac defibrillator) in place   . Sleep apnea     hx of "had surgery for"  . Automatic implantable cardioverter-defibrillator in situ   . Depression   . Numbness and tingling     Hx; 24fleft foot  . PAD (peripheral artery disease)     Severe by PV angiogram 09/2011  . Renal artery stenosis     s/p stenting 2009  . PAD (peripheral artery disease)     s/p multiple LLE bypass grafts; left mid-distal SCA occlusion by 08/2012 duplex  . Hypertension     takes Carvedilol and Losartan daily  . Constipation     takes Miralax daily as needed and Colace daily   . Type II diabetes mellitus     takes Novolog 70/30  . CHF (congestive heart failure)     takes Lasix daily  . History of  MRSA infection   . Insomnia     TAKES TRAZODONE NIGHTLY    Past Surgical History  Procedure Laterality Date  . Turp vaporization    . Cardiac defibrillator placement  12/26/09    pacemaker combo  . Cervical epidural injection  2013  . Femoral-tibial bypass graft  09/25/2011    Procedure: BYPASS GRAFT FEMORAL-TIBIAL ARTERY;  Surgeon: JMal Misty MD;  [facility]  Service: Vascular;  Laterality: Left;  Left Femoral - Anterior Tibial Bypass;  saphenous vein graft from left leg  . Intraoperative arteriogram  09/25/2011    Procedure: INTRA OPERATIVE ARTERIOGRAM;  Surgeon: JMal Misty MD;  [facility]  Service: Vascular;  Laterality: Left;  . Femoral-tibial bypass graft  02/07/2012    Procedure: BYPASS GRAFT FEMORAL-TIBIAL ARTERY;  Surgeon: TRosetta Posner MD;  [place] [facility]  Service: Vascular;  Laterality: Left;  Thrombectomy Left Femoral - Tibial Bypass Graft  . Embolectomy  02/07/2012    Procedure: EMBOLECTOMY;  Surgeon: TRosetta Posner MD;  [place] [facility]  Service: Vascular;  Laterality: Left;  . Femoral-tibial bypass graft  04/03/2012    Procedure: BYPASS GRAFT FEMORAL-TIBIAL ARTERY;  Surgeon: JMal Misty MD;  [facility];  Service: Vascular;  Laterality: Left;  Redo  . Insert / replace / remove pacemaker  2007  . Coronary angioplasty with stent placement  ~ 2000  . Coronary angioplasty    . Uvulopalatopharyngoplasty (uppp)/tonsillectomy/septoplasty  06/30/2003    Archie Endo 06/30/2003 (07/10/2012)  . Shoulder open rotator cuff repair Right 2001    repair of lacerated right/notes 10/11/1999  (07/10/2012)  . Foot surgery Right 03/20/2001    "have plates and screws in; didn't break it" (07/10/2012)  . Carpal tunnel release Right 2002    Archie Endo 03/20/2001 (07/10/2012)  . Biceps tendon repair Right 2001    Archie Endo 03/20/2001 (07/10/2012)  . Renal artery stent  2009  . Femoral-popliteal bypass graft Left 09/16/2012    Procedure: LEFT FEMORAL-POPLITEAL BYPASS GRAFT WITH GORTEX Propaten  Graft 6x80 Thin Wall and Left lower leg Angiogram;  Surgeon: Mal Misty, MD;  Location: [place];  Service: Vascular;  Laterality: Left;  . Colonoscopy      Hx; of  . Tonsillectomy    . Adenoidectomy      Hx; of   . Femoral-tibial bypass graft Left 09/30/2012    Procedure: REDO LEFT FEMORAL-ANTERIOR TIBIAL ARTERY BYPASS USING COMPOSITE CEPHALIC AND BASILIC VEIN GRAFT FROM LEFT ARM;  Surgeon: Mal Misty, MD;  Location: [place];  Service: Vascular;  Laterality: Left;  . I&d extremity Left 10/21/2012    Procedure: EXPLORATION AND DEBRIDEMENT OF LEFT GROIN WOUND;  Surgeon: Mal Misty, MD;  Location: [place];  Service: Vascular;  Laterality: Left;  . Patch angioplasty Left 10/21/2012    Procedure: PATCH ANGIOPLASTY;  Surgeon: Mal Misty, MD;  Location: [place];  Service: Vascular;  Laterality: Left;  . Groin debridement Left 11/12/2012    Procedure: CLOSURE INGUINAL WOUND;  Surgeon: Mal Misty, MD;  Location: [place];  Service: Vascular;  Laterality: Left;  . Embolectomy Left 12/16/2012    Procedure: THROMBECTOMY  LEFT LEG BYPASS;  Surgeon: Elam Dutch, MD;  Location: [place];  Service: Vascular;  Laterality: Left;  . Leg amputation above knee Left 02/04/2013    DR LAWSON  . Amputation Left 02/04/2013    Procedure: AMPUTATION ABOVE KNEE-LEFT;  Surgeon: Mal Misty, MD;  Location: [place];  Service: Vascular;  Laterality: Left;  . Removal of graft Left 04/16/2013    Procedure: I & D LEFT AKA WOUND, POSSIBLE REMOVAL OF INFECTED GORTEX GRAFT;  Surgeon: Mal Misty, MD;  Location: [place];  Service: Vascular;  Laterality: Left;    There were no vitals taken for this visit.  Visit Diagnosis:  Abnormality of gait  Activity intolerance  Status post above knee amputation of left lower extremity      Subjective Assessment - 02/03/14 0939    Symptoms Patient states he feels lousy due to bilateral thumb gout. States the medication does not help pain and he has scheduled a doctors  appointment with his PCP.   Currently in Pain? Yes   Pain Score 5    Pain Location Hand  bilateral thumb pain   Pain Orientation Left;Right   Pain Descriptors / Indicators Aching   Pain Type Acute pain   Pain Onset 1 to 4 weeks ago   Pain Frequency Constant   Aggravating Factors  weight bearing   Pain Relieving Factors medication          [facility] PT Assessment - 02/03/14 0930    Observation/Other Assessments   Focus on Therapeutic Outcomes (FOTO)  52  functional status score at dc, initial was 42          [facility] Adult PT Treatment/Exercise - 02/03/14 0930    Transfers   Transfers Sit to Stand   Sit to Stand 6: Modified independent (Device/Increase time)   Ambulation/Gait   Ambulation/Gait Yes   Ambulation/Gait Assistance 6: Modified independent (Device/Increase time)   Ambulation/Gait Assistance Details no cues required   Ambulation Distance (Feet) 230 Feet  x1; 75 x1   Assistive device Rolling walker  L AKA prosthesis   Gait Pattern Step-to pattern;Step-through pattern;Decreased step length - right;Decreased stance time - left  50/50 step-to vs step-through gait pattern   Gait velocity decreaesd   Stairs Yes   Stairs Assistance 6: Modified independent (Device/Increase time)   Stairs Assistance Details (indicate cue type and reason) patient requires 2 rails to perform safe and with proper sequencing. Pt states with 1 rail he would not feel comfortable doing   Stair Management Technique Two rails;Step to pattern;Forwards;Other (comment)  L AKA prosthesis   Number of Stairs 4   Height of Stairs 6   Ramp 6: Modified independent (Device)   Ramp Details (indicate cue type and reason) no cues or guarding required to safely perform   Curb 6: Modified independent (Device/increase time)   Curb Details (indicate cue type and reason) no cues or guarding required to safely perform   Prosthetics   Current prosthetic wear tolerance (days/week)  7   Current prosthetic wear tolerance  (#hours/day)  12  of 14 awake hours (86%   Residual limb condition  intact per pt   Donning Prosthesis Independent   Doffing Prosthesis Independent          PT Education - 02/03/14 1128    Education provided Yes   Education Details HEP after discharge   Person(s) Educated Patient   Methods Explanation   Comprehension Verbalized understanding          PT Short Term Goals - 02/03/14 0930    PT SHORT TERM GOAL #1   Title ambulates 150' with crutches & prosthesis with supervision.   Baseline Patient ambulated 125' with crutches & prosthesis with minimal gaurd assist.   Status Not Met   PT SHORT TERM GOAL #2   Title ambulate 74' with single point cane & prosthesis with supervision   Baseline Patient ambulated 58' with prosthetic knee unlocked with maximal assist / hand hold & 15' with prosthetic knee locked with moderate assist /hand hold.   Status Not Met   PT SHORT TERM GOAL #3   Title negotiate ramp & curb with crutches & prosthesis with minimal assist   Baseline Patient negotiated ramp & curb with crutches with minimal assist for curb & moderate assist on ramp with constant cueing.   Status Not Met   PT SHORT TERM GOAL #4   Title reaches 5" without UE support safely with supervision.   Baseline reaches 2" without UE support with minimal assist.   Status Not Met          PT Long Term Goals - 02/03/14 0930    PT LONG TERM GOAL #1   Title Patient demonstrates & verbalizes proper prosthetic care to enable safe utilization of the prosthesis. 02/04/2014   PT LONG TERM GOAL #2   Title tolerates wear of prosthesis >90% of awake hours without change in skin integrity nor undue tenderness. 02/04/2014   Baseline Patient tolerates wear 12 or 14 hours a day (86%)   Status Partially Met  PT LONG TERM GOAL #3   Title ambulates 200' with rolling walker & prosthesis modified independent. 02/04/2014   Baseline Pt ambulated 230' with RW and prosthesis modified independent   Status  Not Met   PT LONG TERM GOAL #4   Title ambulate 50' on uneven (grass) surfaces with rolling walker & prosthesis modified independent. 12/04/205   Baseline Patient states he does not walk on grass and avoids it as a safety technique   Status Not Met   PT LONG TERM GOAL #5   Title negotiate ramp, curb & stairs (1 rail) with rolling walker & prosthesis modified independent. 02/04/2014   Baseline Patient completed stairs with 2 rails stating he is not able to complete with only one rail and uses 2 rails as a safety technique.   Status Partially Met   PT LONG TERM GOAL #6   Title perform standing activities for >10 minutes with prosthesis with pain /discomfort </= 2/10 increments on 0-10 scale. 02/04/2014   Status Achieved   PT LONG TERM GOAL #8   Title self-report via FOTO improvement >10 points in Functional Status Measure. 02/04/2014   Baseline Scored 52 on discharge and 42 on initial evaluation   Status Achieved          Plan - 02/03/14 1128    Clinical Impression Statement Patient demonstrated safe mobility with no cues with his major limitation being activity tolerance. Patient demonstrated appropriate safety awareness to perform exercises outside of therapy safely without complications.    Pt will benefit from skilled therapeutic intervention in order to improve on the following deficits Abnormal gait;Decreased activity tolerance;Decreased balance;Decreased endurance;Decreased knowledge of use of DME;Decreased strength;Decreased range of motion;Pain;Difficulty walking;Decreased mobility;Impaired flexibility   Rehab Potential Good   PT Frequency 2x / week   PT Duration 8 weeks   PT Treatment/Interventions DME Instruction;Gait training;Stair training;Functional mobility training;Therapeutic exercise;Balance training;Neuromuscular re-education;Therapeutic activities;Patient/family education;Other (comment);Energy conservation  prosthetic training   PT Next Visit Plan Discharge visit   PT  Home Exercise Plan Continue previous HEP exercises   Consulted and Agree with Plan of Care Patient                               Problem List Patient Active Problem List   Diagnosis Date Noted  . Wound disruption, post-op, skin 05/28/2013  . Encounter for therapeutic drug monitoring 04/22/2013  . Wound infection 04/16/2013  . Chronic infection of amputation stump 04/13/2013  . Phantom limb pain 03/24/2013  . Wound drainage 03/23/2013  . Encounter for staple removal 03/09/2013  . Acute blood loss anemia 02/10/2013  . Chronic kidney disease 02/10/2013  . Unilateral AKA 02/09/2013  . Nontraumatic ischemic infarction of muscle of lower leg 02/04/2013  . Long term (current) use of anticoagulants 02/01/2013  . Pain, limb, left-Leg 01/21/2013  . Aftercare following surgery of the circulatory system, [place] 01/12/2013  . PVD (peripheral vascular disease) 12/22/2012  . Swelling of limb 10/07/2012  . Subclavian artery stenosis 09/08/2012  . Peripheral vascular disease, unspecified 08/18/2012  . Fever 07/08/2012  . Leg edema 06/02/2012  . Cardiomyopathy, ischemic 05/18/2012  . S/P ICD (internal cardiac defibrillator) procedure 05/18/2012  . Diabetes 05/18/2012  . Pain in limb 02/07/2012  . Atherosclerosis of native arteries of the extremities with intermittent claudication 12/17/2011  . Atherosclerotic PVD with intermittent claudication 10/15/2011  . Type I (juvenile type) diabetes mellitus with peripheral circulatory disorders, uncontrolled 09/06/2011  . PAD (peripheral  artery disease) 09/04/2011  . CAD (coronary artery disease)   . Burning sensation of feet 05/10/2011  . Bursitis of right hip 05/10/2011  . Cervicalgia 05/10/2011  . Balance disorder 05/10/2011  . Neurogenic claudication 05/10/2011  . Lumbago 05/10/2011  . Chronic systolic congestive heart failure 07/06/2010  . ECZEMA 01/17/2010  . RENAL INSUFFICIENCY 11/01/2009  . UTI 11/01/2009  .  CELLULITIS, LEG, RIGHT 08/22/2009  . LYMPHADENOPATHY, REACTIVE 08/22/2009  . PERIPHERAL NEUROPATHY 01/24/2009  . DIZZINESS 12/29/2008  . ABDOMINAL PAIN, GENERALIZED 08/22/2008  . Gout 09/18/2007  . CORONARY ARTERY DISEASE 09/18/2007  . HYPERLIPIDEMIA 01/23/2007  . HYPERTENSION 01/23/2007  . Atrial fibrillation 01/23/2007  . BENIGN PROSTATIC HYPERTROPHY 01/23/2007  . LATERAL EPICONDYLITIS 01/23/2007      All PT delivered under supervision of Licensed Physical Therapist.   Blima Rich, Student PT 02/03/2014, 11:37 AM   PT updated LTGs after this note. See note from Jamey Reas, PT, DPT  This entire session was performed under direct supervision and direction of a licensed therapist/therapist assistant . I have personally read, edited and approve of the note as re-written.  Jamey Reas, PT, DPT PT Specializing in [place] 02/03/2014 1:11 PM Phone:  [phone]  Fax:  [phone] [place] [address] [place], [place] [address]

## 2014-02-03 NOTE — Therapy (Unsigned)
[facility] [address] [place], [place], [address] Phone: [phone]   Fax:  [phone]  Patient Details  Name: [name] MRN: [id number] Date of Birth: [birth year]  Encounter Date: 02/03/2014  PHYSICAL THERAPY DISCHARGE SUMMARY  Visits from Start of Care: 1  Current functional level related to goals / functional outcomes:  PT LONG TERM GOAL #1    Title  Patient demonstrates & verbalizes proper prosthetic care to enable safe utilization of the prosthesis. 02/04/2014    Baseline  02/03/14 MET    Status  Achieved    PT LONG TERM GOAL #2    Title  tolerates wear of prosthesis >90% of awake hours without change in skin integrity nor undue tenderness. 02/04/2014    Baseline  DC on 02/03/14: Patient tolerates wear 12 of 14 hours a day (86%)    Status  Partially Met    PT LONG TERM GOAL #3    Title  ambulates 200' with rolling walker & prosthesis modified independent. 02/04/2014    Baseline  DC on 02/03/14: Pt ambulated 230' with RW and prosthesis modified independent    Status  Achieved    PT LONG TERM GOAL #4    Title  ambulate 50' on uneven (grass) surfaces with rolling walker & prosthesis modified independen- t. 12/04/205    Baseline  DC on 02/03/14: Patient states he does not walk on grass and avoids it as a safety technique    Status  Deferred    PT LONG TERM GOAL #5    Title  negotiate ramp, curb & stairs (1 rail) with rolling walker & prosthesis modified independen- t. 02/04/2014    Baseline  DC on 02/03/14: Patient completed stairs with 2 rails stating he is not able to complete with only one rail and uses 2 rails as a safety technique; modified independent on ramp & curb.    Status  Partially Met    PT LONG TERM GOAL #6    Title  perform standing activities for >10 minutes with prosthesis with pain /discomfort </= 2/10 increments on 0-10 scale. 02/04/2014     Baseline  02/03/14 MET    Status  Achieved    PT LONG TERM GOAL #7    Title  Berg Balance Test score with prosthesis >/= 30/ 56. 02/04/2014    Baseline  DC on 02/01/14: Merrilee Jansky 31/56    Status  Achieved    PT LONG TERM GOAL #8    Title  self-report via FOTO improvement >10 points in Functional Status Measure. 02/04/2014    Baseline  Scored 52 on discharge and 42 on initial evaluation    Status  Achieved    PT LONG TERM GOAL #9    TITLE  ambulates 50' carrying cup or plate with cane & prosthesis modified independent. 02/04/2014    Baseline  DC on 02/03/14: Goal was discontinued as not safe without walker support.    Status  Deferred        Remaining deficits: [name] is dependent on walker to ambulate with his prosthesis. He can only ambulate ~230' before fatigue.  [name] received a microprocessor (C-leg) prosthesis as his prior level of function indicated was potential.  He is limited by vascular changes, gout pain and limited activity tolerance.    Education / Equipment: Armed forces logistics/support/administrative officer, increasing activity level and HEP. Plan: Patient agrees to discharge.  Patient goals were partially met. Patient is being discharged due to meeting the stated rehab goals.  ?????  And lack of progress.  Jamey Reas, PT, DPT PT Specializing in [place] 02/03/2014 2:41 PM Phone:  [phone]  Fax:  [phone] [place] [address] [place], [place] [address] Jamey Reas 02/03/2014, 2:27 PM

## 2014-02-03 NOTE — Therapy (Signed)
[facility] [address] [place], [place], [address] Phone: [phone]   Fax:  [phone]  Physical Therapy Treatment  Patient Details  Name: [name] MRN: [id number] Date of Birth: 07/07/1934  Encounter Date: 02/03/2014      PT End of Session - 02/03/14 1128    Visit Number 76   Number of Visits 44   Date for PT Re-Evaluation 02/04/14   PT Start Time 0933   PT Stop Time 1015   PT Time Calculation (min) 42 min   Equipment Utilized During Treatment Gait belt   Activity Tolerance Patient tolerated treatment well;Patient limited by fatigue   Behavior During Therapy [facility] for tasks assessed/performed      Past Medical History  Diagnosis Date  . Gout   . Benign prostatic hypertrophy     takes Proscar daily  . Atrial fibrillation     takes Warfarin daily  . Chronic systolic dysfunction of left ventricle     a. mixed ischemic and nonischemic CM,  EF 35%. b. s/p AICD implantation.  . ED (erectile dysfunction)   . Arthritis   . Pacemaker     medtronic  . CAD (coronary artery disease)     a. s/p mid LAD stenting with DES 2008 Dr. Daneen Schick  . ICD (implantable cardiac defibrillator) in place     medtronic, Dr. Rayann Heman  . ICD (implantable cardiac defibrillator) in place   . Sleep apnea     hx of "had surgery for"  . Automatic implantable cardioverter-defibrillator in situ   . Depression   . Numbness and tingling     Hx; 59fleft foot  . PAD (peripheral artery disease)     Severe by PV angiogram 09/2011  . Renal artery stenosis     s/p stenting 2009  . PAD (peripheral artery disease)     s/p multiple LLE bypass grafts; left mid-distal SCA occlusion by 08/2012 duplex  . Hypertension     takes Carvedilol and Losartan daily  . Constipation     takes Miralax daily as needed and Colace daily   . Type II diabetes mellitus     takes Novolog 70/30  . CHF (congestive heart failure)     takes Lasix daily  . History of  MRSA infection   . Insomnia     TAKES TRAZODONE NIGHTLY    Past Surgical History  Procedure Laterality Date  . Turp vaporization    . Cardiac defibrillator placement  12/26/09    pacemaker combo  . Cervical epidural injection  2013  . Femoral-tibial bypass graft  09/25/2011    Procedure: BYPASS GRAFT FEMORAL-TIBIAL ARTERY;  Surgeon: JMal Misty MD;  [facility]  Service: Vascular;  Laterality: Left;  Left Femoral - Anterior Tibial Bypass;  saphenous vein graft from left leg  . Intraoperative arteriogram  09/25/2011    Procedure: INTRA OPERATIVE ARTERIOGRAM;  Surgeon: JMal Misty MD;  [place] [facility]  Service: Vascular;  Laterality: Left;  . Femoral-tibial bypass graft  02/07/2012    Procedure: BYPASS GRAFT FEMORAL-TIBIAL ARTERY;  Surgeon: TRosetta Posner MD;  [place] [facility]  Service: Vascular;  Laterality: Left;  Thrombectomy Left Femoral - Tibial Bypass Graft  . Embolectomy  02/07/2012    Procedure: EMBOLECTOMY;  Surgeon: TRosetta Posner MD;  [place] [facility]  Service: Vascular;  Laterality: Left;  . Femoral-tibial bypass graft  04/03/2012    Procedure: BYPASS GRAFT FEMORAL-TIBIAL ARTERY;  Surgeon: JMal Misty MD;  [facility];  Service: Vascular;  Laterality: Left;  Redo  . Insert / replace / remove pacemaker  2007  . Coronary angioplasty with stent placement  ~ 2000  . Coronary angioplasty    . Uvulopalatopharyngoplasty (uppp)/tonsillectomy/septoplasty  06/30/2003    Archie Endo 06/30/2003 (07/10/2012)  . Shoulder open rotator cuff repair Right 2001    repair of lacerated right/notes 10/11/1999  (07/10/2012)  . Foot surgery Right 03/20/2001    "have plates and screws in; didn't break it" (07/10/2012)  . Carpal tunnel release Right 2002    Archie Endo 03/20/2001 (07/10/2012)  . Biceps tendon repair Right 2001    Archie Endo 03/20/2001 (07/10/2012)  . Renal artery stent  2009  . Femoral-popliteal bypass graft Left 09/16/2012    Procedure: LEFT FEMORAL-POPLITEAL BYPASS GRAFT WITH GORTEX Propaten  Graft 6x80 Thin Wall and Left lower leg Angiogram;  Surgeon: Mal Misty, MD;  Location: [place];  Service: Vascular;  Laterality: Left;  . Colonoscopy      Hx; of  . Tonsillectomy    . Adenoidectomy      Hx; of   . Femoral-tibial bypass graft Left 09/30/2012    Procedure: REDO LEFT FEMORAL-ANTERIOR TIBIAL ARTERY BYPASS USING COMPOSITE CEPHALIC AND BASILIC VEIN GRAFT FROM LEFT ARM;  Surgeon: Mal Misty, MD;  Location: [place];  Service: Vascular;  Laterality: Left;  . I&d extremity Left 10/21/2012    Procedure: EXPLORATION AND DEBRIDEMENT OF LEFT GROIN WOUND;  Surgeon: Mal Misty, MD;  Location: [place];  Service: Vascular;  Laterality: Left;  . Patch angioplasty Left 10/21/2012    Procedure: PATCH ANGIOPLASTY;  Surgeon: Mal Misty, MD;  Location: [place];  Service: Vascular;  Laterality: Left;  . Groin debridement Left 11/12/2012    Procedure: CLOSURE INGUINAL WOUND;  Surgeon: Mal Misty, MD;  Location: [place];  Service: Vascular;  Laterality: Left;  . Embolectomy Left 12/16/2012    Procedure: THROMBECTOMY  LEFT LEG BYPASS;  Surgeon: Elam Dutch, MD;  Location: [place];  Service: Vascular;  Laterality: Left;  . Leg amputation above knee Left 02/04/2013    DR LAWSON  . Amputation Left 02/04/2013    Procedure: AMPUTATION ABOVE KNEE-LEFT;  Surgeon: Mal Misty, MD;  Location: [place];  Service: Vascular;  Laterality: Left;  . Removal of graft Left 04/16/2013    Procedure: I & D LEFT AKA WOUND, POSSIBLE REMOVAL OF INFECTED GORTEX GRAFT;  Surgeon: Mal Misty, MD;  Location: [place];  Service: Vascular;  Laterality: Left;    There were no vitals taken for this visit.  Visit Diagnosis:  Abnormality of gait  Activity intolerance  Status post above knee amputation of left lower extremity      Subjective Assessment - 02/03/14 0939    Symptoms Patient states he feels lousy due to bilateral thumb gout. States the medication does not help pain and he has scheduled a doctors  appointment with his PCP.   Currently in Pain? Yes   Pain Score 5    Pain Location Hand  bilateral thumb pain   Pain Orientation Left;Right   Pain Descriptors / Indicators Aching   Pain Type Acute pain   Pain Onset 1 to 4 weeks ago   Pain Frequency Constant   Aggravating Factors  weight bearing   Pain Relieving Factors medication          [facility] PT Assessment - 02/03/14 0930    Observation/Other Assessments   Focus on Therapeutic Outcomes (FOTO)  52  functional status score at dc, initial was 42          [facility] Adult PT Treatment/Exercise - 02/03/14 0930    Transfers   Transfers Sit to Stand   Sit to Stand 6: Modified independent (Device/Increase time)   Ambulation/Gait   Ambulation/Gait Yes   Ambulation/Gait Assistance 6: Modified independent (Device/Increase time)   Ambulation/Gait Assistance Details no cues required   Ambulation Distance (Feet) 230 Feet  x1; 75 x1   Assistive device Rolling walker  L AKA prosthesis   Gait Pattern Step-to pattern;Step-through pattern;Decreased step length - right;Decreased stance time - left  50/50 step-to vs step-through gait pattern   Gait velocity decreaesd   Stairs Yes   Stairs Assistance 6: Modified independent (Device/Increase time)   Stairs Assistance Details (indicate cue type and reason) patient requires 2 rails to perform safe and with proper sequencing. Pt states with 1 rail he would not feel comfortable doing   Stair Management Technique Two rails;Step to pattern;Forwards;Other (comment)  L AKA prosthesis   Number of Stairs 4   Height of Stairs 6   Ramp 6: Modified independent (Device)   Ramp Details (indicate cue type and reason) no cues or guarding required to safely perform   Curb 6: Modified independent (Device/increase time)   Curb Details (indicate cue type and reason) no cues or guarding required to safely perform   Prosthetics   Current prosthetic wear tolerance (days/week)  7   Current prosthetic wear tolerance  (#hours/day)  12  of 14 awake hours (86%   Residual limb condition  intact per pt   Donning Prosthesis Independent   Doffing Prosthesis Independent          PT Education - 02/03/14 1128    Education provided Yes   Education Details HEP after discharge   Person(s) Educated Patient   Methods Explanation   Comprehension Verbalized understanding          PT Short Term Goals - 02/03/14 0930    PT SHORT TERM GOAL #1   Title ambulates 150' with crutches & prosthesis with supervision.   Baseline Patient ambulated 125' with crutches & prosthesis with minimal gaurd assist.   Status Not Met   PT SHORT TERM GOAL #2   Title ambulate 38' with single point cane & prosthesis with supervision   Baseline Patient ambulated 59' with prosthetic knee unlocked with maximal assist / hand hold & 15' with prosthetic knee locked with moderate assist /hand hold.   Status Not Met   PT SHORT TERM GOAL #3   Title negotiate ramp & curb with crutches & prosthesis with minimal assist   Baseline Patient negotiated ramp & curb with crutches with minimal assist for curb & moderate assist on ramp with constant cueing.   Status Not Met   PT SHORT TERM GOAL #4   Title reaches 5" without UE support safely with supervision.   Baseline reaches 2" without UE support with minimal assist.   Status Not Met          PT Long Term Goals - 02/03/14 0930    PT LONG TERM GOAL #1   Title Patient demonstrates & verbalizes proper prosthetic care to enable safe utilization of the prosthesis. 02/04/2014   Baseline 02/03/14 MET   Status Achieved   PT LONG TERM GOAL #2   Title tolerates wear of prosthesis >90% of awake hours without change in skin integrity nor undue tenderness. 02/04/2014   Baseline DC on 02/03/14: Patient tolerates wear  12 of 14 hours a day (86%)   Status Partially Met   PT LONG TERM GOAL #3   Title ambulates 200' with rolling walker & prosthesis modified independent. 02/04/2014   Baseline DC on 02/03/14:  Pt ambulated 230' with RW and prosthesis modified independent   Status Achieved   PT LONG TERM GOAL #4   Title ambulate 50' on uneven (grass) surfaces with rolling walker & prosthesis modified independent. 12/04/205   Baseline DC on 02/03/14: Patient states he does not walk on grass and avoids it as a safety technique   Status Deferred   PT LONG TERM GOAL #5   Title negotiate ramp, curb & stairs (1 rail) with rolling walker & prosthesis modified independent. 02/04/2014   Baseline DC on 02/03/14: Patient completed stairs with 2 rails stating he is not able to complete with only one rail and uses 2 rails as a safety technique; modified independent on ramp & curb.   Status Partially Met   PT LONG TERM GOAL #6   Title perform standing activities for >10 minutes with prosthesis with pain /discomfort </= 2/10 increments on 0-10 scale. 02/04/2014   Baseline 02/03/14 MET   Status Achieved   PT LONG TERM GOAL #7   Title Berg Balance Test score with prosthesis >/= 30/56. 02/04/2014   Baseline DC on 02/01/14: Merrilee Jansky 31/56   Status Achieved   PT LONG TERM GOAL #8   Title self-report via FOTO improvement >10 points in Functional Status Measure. 02/04/2014   Baseline Scored 52 on discharge and 42 on initial evaluation   Status Achieved   PT LONG TERM GOAL  #9   TITLE ambulates 50' carrying cup or plate with cane & prosthesis modified independent. 02/04/2014   Baseline DC on 02/03/14: Goal was discontinued as not safe without walker support.   Status Deferred          Plan - 02/03/14 1128    Clinical Impression Statement Patient demonstrated safe mobility with no cues with his major limitation being activity tolerance. Patient demonstrated appropriate safety awareness to perform exercises outside of therapy safely without complications.    Pt will benefit from skilled therapeutic intervention in order to improve on the following deficits Abnormal gait;Decreased activity tolerance;Decreased  balance;Decreased endurance;Decreased knowledge of use of DME;Decreased strength;Decreased range of motion;Pain;Difficulty walking;Decreased mobility;Impaired flexibility   Rehab Potential Good   PT Frequency 2x / week   PT Duration 8 weeks   PT Treatment/Interventions DME Instruction;Gait training;Stair training;Functional mobility training;Therapeutic exercise;Balance training;Neuromuscular re-education;Therapeutic activities;Patient/family education;Other (comment);Energy conservation  prosthetic training   PT Next Visit Plan Discharge visit   PT Home Exercise Plan Continue previous HEP exercises   Consulted and Agree with Plan of Care Patient                               Problem List Patient Active Problem List   Diagnosis Date Noted  . Wound disruption, post-op, skin 05/28/2013  . Encounter for therapeutic drug monitoring 04/22/2013  . Wound infection 04/16/2013  . Chronic infection of amputation stump 04/13/2013  . Phantom limb pain 03/24/2013  . Wound drainage 03/23/2013  . Encounter for staple removal 03/09/2013  . Acute blood loss anemia 02/10/2013  . Chronic kidney disease 02/10/2013  . Unilateral AKA 02/09/2013  . Nontraumatic ischemic infarction of muscle of lower leg 02/04/2013  . Long term (current) use of anticoagulants 02/01/2013  . Pain, limb, left-Leg 01/21/2013  . Aftercare  following surgery of the circulatory system, NEC 01/12/2013  . PVD (peripheral vascular disease) 12/22/2012  . Swelling of limb 10/07/2012  . Subclavian artery stenosis 09/08/2012  . Peripheral vascular disease, unspecified 08/18/2012  . Fever 07/08/2012  . Leg edema 06/02/2012  . Cardiomyopathy, ischemic 05/18/2012  . S/P ICD (internal cardiac defibrillator) procedure 05/18/2012  . Diabetes 05/18/2012  . Pain in limb 02/07/2012  . Atherosclerosis of native arteries of the extremities with intermittent claudication 12/17/2011  . Atherosclerotic PVD with  intermittent claudication 10/15/2011  . Type I (juvenile type) diabetes mellitus with peripheral circulatory disorders, uncontrolled 09/06/2011  . PAD (peripheral artery disease) 09/04/2011  . CAD (coronary artery disease)   . Burning sensation of feet 05/10/2011  . Bursitis of right hip 05/10/2011  . Cervicalgia 05/10/2011  . Balance disorder 05/10/2011  . Neurogenic claudication 05/10/2011  . Lumbago 05/10/2011  . Chronic systolic congestive heart failure 07/06/2010  . ECZEMA 01/17/2010  . RENAL INSUFFICIENCY 11/01/2009  . UTI 11/01/2009  . CELLULITIS, LEG, RIGHT 08/22/2009  . LYMPHADENOPATHY, REACTIVE 08/22/2009  . PERIPHERAL NEUROPATHY 01/24/2009  . DIZZINESS 12/29/2008  . ABDOMINAL PAIN, GENERALIZED 08/22/2008  . Gout 09/18/2007  . CORONARY ARTERY DISEASE 09/18/2007  . HYPERLIPIDEMIA 01/23/2007  . HYPERTENSION 01/23/2007  . Atrial fibrillation 01/23/2007  . BENIGN PROSTATIC HYPERTROPHY 01/23/2007  . LATERAL EPICONDYLITIS 01/23/2007    Danelly Hassinger 02/03/2014, 1:12 PM     This entire session was performed under direct supervision and direction of a licensed therapist/therapist assistant . I have personally read, edited and approve of the note as written.  Blima Rich, Student PT  Jamey Reas, PT, DPT PT Specializing in [place] 02/03/2014 1:12 PM Phone:  [phone]  Fax:  [phone] [facility] [address] [place], [place] [address]

## 2014-02-04 ENCOUNTER — Telehealth: Payer: Self-pay | Admitting: *Deleted

## 2014-02-04 NOTE — Telephone Encounter (Signed)
Called patient and issued a verbal warning - cannot be drinking any alcohol while on narcotics.  Patient was upset, stated that it's only one or two glasses per week.  I confirmed with patient that this is a warning and if it happened again Dr. Aletha Halim not continue prescribing narcotics.

## 2014-02-07 ENCOUNTER — Other Ambulatory Visit: Payer: Self-pay | Admitting: Family Medicine

## 2014-02-07 ENCOUNTER — Ambulatory Visit (INDEPENDENT_AMBULATORY_CARE_PROVIDER_SITE_OTHER): Payer: Medicare Other | Admitting: Family

## 2014-02-07 ENCOUNTER — Ambulatory Visit (INDEPENDENT_AMBULATORY_CARE_PROVIDER_SITE_OTHER): Payer: Medicare Other | Admitting: Family Medicine

## 2014-02-07 ENCOUNTER — Encounter: Payer: Self-pay | Admitting: Family Medicine

## 2014-02-07 VITALS — BP 171/99 | HR 68 | Temp 98.5°F

## 2014-02-07 DIAGNOSIS — Z5181 Encounter for therapeutic drug level monitoring: Secondary | ICD-10-CM

## 2014-02-07 DIAGNOSIS — I482 Chronic atrial fibrillation, unspecified: Secondary | ICD-10-CM

## 2014-02-07 DIAGNOSIS — I4891 Unspecified atrial fibrillation: Secondary | ICD-10-CM

## 2014-02-07 DIAGNOSIS — N3281 Overactive bladder: Secondary | ICD-10-CM | POA: Insufficient documentation

## 2014-02-07 DIAGNOSIS — M1000 Idiopathic gout, unspecified site: Secondary | ICD-10-CM

## 2014-02-07 LAB — BASIC METABOLIC PANEL
BUN: 32 mg/dL — AB (ref 6–23)
CHLORIDE: 107 meq/L (ref 96–112)
CO2: 24 meq/L (ref 19–32)
Calcium: 8.9 mg/dL (ref 8.4–10.5)
Creatinine, Ser: 1.9 mg/dL — ABNORMAL HIGH (ref 0.4–1.5)
GFR: 37.54 mL/min — ABNORMAL LOW (ref >60.00)
GLUCOSE: 64 mg/dL — AB (ref 70–99)
POTASSIUM: 4.6 meq/L (ref 3.5–5.1)
Sodium: 137 mEq/L (ref 135–145)

## 2014-02-07 LAB — POCT INR: INR: 1.9

## 2014-02-07 MED ORDER — FINASTERIDE 5 MG PO TABS: 5.0000 mg | ORAL_TABLET | Freq: Every day | ORAL | Status: DC

## 2014-02-07 MED ORDER — OXYBUTYNIN CHLORIDE ER 10 MG PO TB24: 10.0000 mg | ORAL_TABLET | Freq: Every day | ORAL | Status: DC

## 2014-02-07 MED ORDER — DABIGATRAN ETEXILATE MESYLATE 150 MG PO CAPS: 150.0000 mg | ORAL_CAPSULE | Freq: Two times a day (BID) | ORAL | Status: DC

## 2014-02-07 MED ORDER — PREDNISONE 10 MG PO TABS: ORAL_TABLET | ORAL | Status: DC

## 2014-02-07 NOTE — Progress Notes (Signed)
Pre visit review using our clinic review tool, if applicable. No additional management support is needed unless otherwise documented below in the visit note. 

## 2014-02-07 NOTE — Progress Notes (Signed)
   Subjective:    Patient ID: [name], male    DOB: 1934-07-07, 79 y.o.   MRN: [id number]  HPI Here for one month of swelling and pain in both hands, the left being the worst. Using ice and Aleve with no benefit. He has a hx of gout in the feet but never the hands. He had been off Allopurinol for some time but he started it back about 5 weeks ago. He also mentions increased frequency of urine lately, though there is no discomfort. He has had some urge incontinence also.    Review of Systems  Constitutional: Negative.   Genitourinary: Positive for urgency and frequency. Negative for dysuria, hematuria and flank pain.  Musculoskeletal: Positive for joint swelling and arthralgias.       Objective:   Physical Exam  Constitutional: He appears well-developed and well-nourished.  Cardiovascular: Normal rate, regular rhythm, normal heart sounds and intact distal pulses.   Pulmonary/Chest: Effort normal and breath sounds normal.  Abdominal: Soft. Bowel sounds are normal. He exhibits no distension and no mass. There is no tenderness. There is no rebound and no guarding.  Musculoskeletal:  The left hand is puffy and tender, especially at the base of the thumb. Not warm or red. The right hand appears normal          Assessment & Plan:  He is having gout in the hands, and the allopurinol may have precipitated this. He will stop the allopurinol and he will start a 16 day taper of prednisone, to start with 40 mg a day. Try Oxybutynin 10 mg daily for the bladder.

## 2014-02-07 NOTE — Addendum Note (Signed)
Addended byRoxy Cedar B on: 02/07/2014 02:41 PM   Modules accepted: Orders

## 2014-02-07 NOTE — Patient Instructions (Addendum)
1/2 tablet everyday. . Recheck in 4 weeks.  Anticoagulation Dose Instructions as of 02/07/2014      Dorene Grebe Tue Wed Thu Fri Sat   New Dose 2.5 mg 5 mg 2.5 mg 2.5 mg 2.5 mg 2.5 mg 2.5 mg    Description        1/2 tablet everyday except 1 whole tablet on Mondays. Recheck in 4 weeks.

## 2014-02-10 ENCOUNTER — Encounter (HOSPITAL_COMMUNITY): Payer: Self-pay | Admitting: Cardiovascular Disease

## 2014-02-16 NOTE — Progress Notes (Signed)
Urine drug screen for this encounter is inconsistent--positive for [facility] verbal warning issued

## 2014-02-17 ENCOUNTER — Encounter: Payer: Self-pay | Admitting: *Deleted

## 2014-02-21 ENCOUNTER — Encounter: Payer: Self-pay | Admitting: Internal Medicine

## 2014-02-21 ENCOUNTER — Telehealth: Payer: Self-pay | Admitting: Family Medicine

## 2014-02-21 ENCOUNTER — Ambulatory Visit (INDEPENDENT_AMBULATORY_CARE_PROVIDER_SITE_OTHER): Payer: Medicare Other | Admitting: Internal Medicine

## 2014-02-21 ENCOUNTER — Other Ambulatory Visit: Payer: Self-pay | Admitting: Family Medicine

## 2014-02-21 VITALS — BP 174/98 | HR 68 | Ht 68.0 in | Wt 189.2 lb

## 2014-02-21 DIAGNOSIS — I5022 Chronic systolic (congestive) heart failure: Secondary | ICD-10-CM

## 2014-02-21 DIAGNOSIS — I482 Chronic atrial fibrillation, unspecified: Secondary | ICD-10-CM

## 2014-02-21 DIAGNOSIS — I255 Ischemic cardiomyopathy: Secondary | ICD-10-CM

## 2014-02-21 LAB — MDC_IDC_ENUM_SESS_TYPE_INCLINIC
Battery Remaining Longevity: 100 mo
Battery Voltage: 3.04 V
Brady Statistic AP VP Percent: 0 %
Brady Statistic AS VP Percent: 99.78 %
Brady Statistic RA Percent Paced: 0 %
Brady Statistic RV Percent Paced: 99.84 %
Date Time Interrogation Session: 20151221125539
HighPow Impedance: 171 Ohm
HighPow Impedance: 43 Ohm
HighPow Impedance: 54 Ohm
Lead Channel Impedance Value: 494 Ohm
Lead Channel Impedance Value: 760 Ohm
Lead Channel Pacing Threshold Amplitude: 0.625 V
Lead Channel Pacing Threshold Amplitude: 1.375 V
Lead Channel Pacing Threshold Pulse Width: 0.4 ms
Lead Channel Sensing Intrinsic Amplitude: 0.125 mV
Lead Channel Setting Pacing Amplitude: 2.5 V
Lead Channel Setting Pacing Amplitude: 2.5 V
Lead Channel Setting Pacing Pulse Width: 0.4 ms
Lead Channel Setting Pacing Pulse Width: 0.4 ms
Lead Channel Setting Sensing Sensitivity: 0.3 mV
MDC IDC MSMT LEADCHNL LV IMPEDANCE VALUE: 399 Ohm
MDC IDC MSMT LEADCHNL LV IMPEDANCE VALUE: 494 Ohm
MDC IDC MSMT LEADCHNL LV PACING THRESHOLD PULSEWIDTH: 0.4 ms
MDC IDC MSMT LEADCHNL RA IMPEDANCE VALUE: 380 Ohm
MDC IDC MSMT LEADCHNL RV SENSING INTR AMPL: 7.75 mV
MDC IDC SET ZONE DETECTION INTERVAL: 410 ms
MDC IDC STAT BRADY AP VS PERCENT: 0 %
MDC IDC STAT BRADY AS VS PERCENT: 0.22 %
Zone Setting Detection Interval: 290 ms
Zone Setting Detection Interval: 320 ms
Zone Setting Detection Interval: 450 ms

## 2014-02-21 MED ORDER — CARVEDILOL 25 MG PO TABS: 25.0000 mg | ORAL_TABLET | Freq: Two times a day (BID) | ORAL | Status: DC

## 2014-02-21 NOTE — Telephone Encounter (Signed)
Pt states he will have [employer] in [place] and needs referral for physicla therapy. Pt has been getting PT through [facility], but wants to continue therapy in Jan.  Advised pt we would need a copy of new card. He will bring later today.

## 2014-02-21 NOTE — Telephone Encounter (Signed)
Okay per Dr. Sarajane Jews to refill.

## 2014-02-21 NOTE — Patient Instructions (Signed)
Your physician recommends that you schedule a follow-up appointment in: 3 months with Dr. Tamala Julian  Your physician has recommended you make the following change in your medication:  1) Increase Carvedilol to 25mg  twice daily   Your physician wants you to follow-up in: 12 months with Dr Vallery Ridge will receive a reminder letter in the mail two months in advance. If you don't receive a letter, please call our office to schedule the follow-up appointment.  Remote monitoring is used to monitor your Pacemaker of ICD from home. This monitoring reduces the number of office visits required to check your device to one time per year. It allows [place] to keep an eye on the functioning of your device to ensure it is working properly. You are scheduled for a device check from home on 05/23/14. You may send your transmission at any time that day. If you have a wireless device, the transmission will be sent automatically. After your physician reviews your transmission, you will receive a postcard with your next transmission date.

## 2014-02-21 NOTE — Progress Notes (Signed)
PCP: Laurey Morale, MD Primary Cardiologist:  Dr Daneen Schick  [name] is a 79 y.o. male with a h/o mixed ischemic/ nonischemic CM, LBBB, NYHA Class III CHF sp BiV ICD (MDT) by Dr Leonia Reeves who presents today to for follow-up in the Electrophysiology device clinic. Since his recent generator change, he has done well.  His coumadin was changed to pradaxa by primary care due to labile INRs.  Today, he  denies symptoms of palpitations, chest pain, orthopnea, PND, dizziness, presyncope, syncope, or other neurologic sequela.    Past Medical History  Diagnosis Date  . Gout   . Benign prostatic hypertrophy     takes Proscar daily  . Atrial fibrillation     takes Warfarin daily  . Chronic systolic dysfunction of left ventricle     a. mixed ischemic and nonischemic CM,  EF 35%. b. s/p AICD implantation.  . ED (erectile dysfunction)   . Arthritis   . Pacemaker     medtronic  . CAD (coronary artery disease)     a. s/p mid LAD stenting with DES 2008 Dr. Daneen Schick  . ICD (implantable cardiac defibrillator) in place     medtronic, Dr. Rayann Heman  . ICD (implantable cardiac defibrillator) in place   . Sleep apnea     hx of "had surgery for"  . Automatic implantable cardioverter-defibrillator in situ   . Depression   . Numbness and tingling     Hx; 40f left foot  . PAD (peripheral artery disease)     Severe by PV angiogram 09/2011  . Renal artery stenosis     s/p stenting 2009  . PAD (peripheral artery disease)     s/p multiple LLE bypass grafts; left mid-distal SCA occlusion by 08/2012 duplex  . Hypertension     takes Carvedilol and Losartan daily  . Constipation     takes Miralax daily as needed and Colace daily   . Type II diabetes mellitus     takes Novolog 70/30  . CHF (congestive heart failure)     takes Lasix daily  . History of MRSA infection   . Insomnia     TAKES TRAZODONE NIGHTLY    Past Surgical History  Procedure Laterality Date  . Turp vaporization    . Cardiac  defibrillator placement  12/26/09    pacemaker combo  . Cervical epidural injection  2013  . Femoral-tibial bypass graft  09/25/2011    Procedure: BYPASS GRAFT FEMORAL-TIBIAL ARTERY;  Surgeon: Mal Misty, MD;  Location: [facility] OR;  Service: Vascular;  Laterality: Left;  Left Femoral - Anterior Tibial Bypass;  saphenous vein graft from left leg  . Intraoperative arteriogram  09/25/2011    Procedure: INTRA OPERATIVE ARTERIOGRAM;  Surgeon: Mal Misty, MD;  Location: [place];  Service: Vascular;  Laterality: Left;  . Femoral-tibial bypass graft  02/07/2012    Procedure: BYPASS GRAFT FEMORAL-TIBIAL ARTERY;  Surgeon: Rosetta Posner, MD;  Location: [place];  Service: Vascular;  Laterality: Left;  Thrombectomy Left Femoral - Tibial Bypass Graft  . Embolectomy  02/07/2012    Procedure: EMBOLECTOMY;  Surgeon: Rosetta Posner, MD;  Location: [place];  Service: Vascular;  Laterality: Left;  . Femoral-tibial bypass graft  04/03/2012    Procedure: BYPASS GRAFT FEMORAL-TIBIAL ARTERY;  Surgeon: Mal Misty, MD;  Location: [place];  Service: Vascular;  Laterality: Left;  Redo  . Insert / replace / remove pacemaker  2007  . Coronary angioplasty with stent placement  ~  2000  . Coronary angioplasty    . Uvulopalatopharyngoplasty (uppp)/tonsillectomy/septoplasty  06/30/2003    Archie Endo 06/30/2003 (07/10/2012)  . Shoulder open rotator cuff repair Right 2001    repair of lacerated right/notes 10/11/1999  (07/10/2012)  . Foot surgery Right 03/20/2001    "have plates and screws in; didn't break it" (07/10/2012)  . Carpal tunnel release Right 2002    Archie Endo 03/20/2001 (07/10/2012)  . Biceps tendon repair Right 2001    Archie Endo 03/20/2001 (07/10/2012)  . Renal artery stent  2009  . Femoral-popliteal bypass graft Left 09/16/2012    Procedure: LEFT FEMORAL-POPLITEAL BYPASS GRAFT WITH GORTEX Propaten Graft 6x80 Thin Wall and Left lower leg Angiogram;  Surgeon: Mal Misty, MD;  Location: [place];  Service: Vascular;  Laterality: Left;  .  Colonoscopy      Hx; of  . Tonsillectomy    . Adenoidectomy      Hx; of   . Femoral-tibial bypass graft Left 09/30/2012    Procedure: REDO LEFT FEMORAL-ANTERIOR TIBIAL ARTERY BYPASS USING COMPOSITE CEPHALIC AND BASILIC VEIN GRAFT FROM LEFT ARM;  Surgeon: Mal Misty, MD;  Location: [place];  Service: Vascular;  Laterality: Left;  . I&d extremity Left 10/21/2012    Procedure: EXPLORATION AND DEBRIDEMENT OF LEFT GROIN WOUND;  Surgeon: Mal Misty, MD;  Location: [place];  Service: Vascular;  Laterality: Left;  . Patch angioplasty Left 10/21/2012    Procedure: PATCH ANGIOPLASTY;  Surgeon: Mal Misty, MD;  Location: [place];  Service: Vascular;  Laterality: Left;  . Groin debridement Left 11/12/2012    Procedure: CLOSURE INGUINAL WOUND;  Surgeon: Mal Misty, MD;  Location: [place];  Service: Vascular;  Laterality: Left;  . Embolectomy Left 12/16/2012    Procedure: THROMBECTOMY  LEFT LEG BYPASS;  Surgeon: Elam Dutch, MD;  Location: [place];  Service: Vascular;  Laterality: Left;  . Leg amputation above knee Left 02/04/2013    DR LAWSON  . Amputation Left 02/04/2013    Procedure: AMPUTATION ABOVE KNEE-LEFT;  Surgeon: Mal Misty, MD;  Location: [place];  Service: Vascular;  Laterality: Left;  . Removal of graft Left 04/16/2013    Procedure: I & D LEFT AKA WOUND, POSSIBLE REMOVAL OF INFECTED GORTEX GRAFT;  Surgeon: Mal Misty, MD;  Location: [place];  Service: Vascular;  Laterality: Left;  . Abdominal aortagram N/A 09/11/2011    Procedure: ABDOMINAL Maxcine Ham;  Surgeon: Wellington Hampshire, MD;  Location: [facility] CATH LAB;  Service: Cardiovascular;  Laterality: N/A;  . Lower extremity angiogram Left 12/25/2011    Procedure: LOWER EXTREMITY ANGIOGRAM;  Surgeon: Serafina Mitchell, MD;  Location: [facility] CATH LAB;  Service: Cardiovascular;  Laterality: Left;  lt leg angio co2  . Abdominal angiogram  12/25/2011    Procedure: ABDOMINAL ANGIOGRAM;  Surgeon: Serafina Mitchell, MD;  Location: [facility] CATH LAB;  Service:  Cardiovascular;;  . Lower extremity angiogram N/A 07/07/2012    Procedure: LOWER EXTREMITY ANGIOGRAM;  Surgeon: Conrad Powderly, MD;  Location: [facility] CATH LAB;  Service: Cardiovascular;  Laterality: N/A;  . Lower extremity angiogram N/A 09/15/2012    Procedure: LOWER EXTREMITY ANGIOGRAM;  Surgeon: Serafina Mitchell, MD;  Location: [facility] CATH LAB;  Service: Cardiovascular;  Laterality: N/A;  . Upper extremity angiogram  09/15/2012    Procedure: UPPER EXTREMITY ANGIOGRAM;  Surgeon: Serafina Mitchell, MD;  Location: [facility] CATH LAB;  Service: Cardiovascular;;  . Biv icd genertaor change out N/A 11/09/2013    Procedure: BIV ICD GENERTAOR  CHANGE OUT;  Surgeon: Coralyn Mark, MD;  Location: [facility] CATH LAB;  Service: Cardiovascular;  Laterality: N/A;    History   Social History  . Marital Status: Widowed    Spouse Name: N/A    Number of Children: N/A  . Years of Education: N/A   Occupational History  . Retired    Social History Main Topics  . Smoking status: Never Smoker   . Smokeless tobacco: Never Used     Comment: 1 cigar per week  . Alcohol Use: 2.4 oz/week    2 Glasses of wine, 2 Shots of liquor per week     Comment: maybe once a week  . Drug Use: No  . Sexual Activity: Not Currently   Other Topics Concern  . Not on file   Social History Narrative   Lives in [place] with significant other,  [name].    Family History  Problem Relation Age of Onset  . Cancer      breast/fhx  . Heart disease      fhx  . Diabetes Neg Hx   . Cancer Father     Allergies  Allergen Reactions  . Other Other (See Comments)    Plastic tape tears skin  . Bactrim Ds [Sulfamethoxazole-Trimethoprim]     Hand tremors    Current Outpatient Prescriptions  Medication Sig Dispense Refill  . carbamazepine (TEGRETOL) 200 MG tablet Take 1 tablet (200 mg total) by mouth 2 (two) times daily. 60 tablet 3  . carvedilol (COREG) 25 MG tablet Take 1 tablet (25 mg total) by mouth 2 (two) times daily with a meal. 180 tablet 3   . dabigatran (PRADAXA) 150 MG CAPS capsule Take 1 capsule (150 mg total) by mouth 2 (two) times daily. 60 capsule 0  . finasteride (PROSCAR) 5 MG tablet Take 1 tablet (5 mg total) by mouth daily. 90 tablet 3  . gabapentin (NEURONTIN) 300 MG capsule Take 300 mg by mouth 3 (three) times daily.    Marland Kitchen glucose blood (ONE TOUCH ULTRA TEST) test strip use to test blood sugar two times daily as instructed by physician. 100 each 2  . insulin aspart protamine- aspart (NOVOLOG MIX 70/30) (70-30) 100 UNIT/ML injection Inject 7 Units into the skin 2 (two) times daily with a meal.    . Insulin Pen Needle 31G X 8 MM MISC Use 2-3 times per day and diagnosis code is 250.00 100 each 1  . losartan (COZAAR) 100 MG tablet Take 1 tablet (100 mg total) by mouth daily. 90 tablet 3  . nitroGLYCERIN (NITROSTAT) 0.4 MG SL tablet Place 1 tablet (0.4 mg total) under the tongue every 5 (five) minutes as needed for chest pain. For chest pain, max 3 doses 90 tablet 3  . oxybutynin (DITROPAN-XL) 10 MG 24 hr tablet Take 1 tablet (10 mg total) by mouth at bedtime. 30 tablet 2  . oxyCODONE-acetaminophen (PERCOCET) 7.5-325 MG per tablet Take 1 tablet by mouth every 6 (six) hours as needed for pain. 75 tablet 0  . traMADol (ULTRAM) 50 MG tablet Take 50 mg by mouth at bedtime as needed (pain).      No current facility-administered medications for this visit.   Physical Exam: Filed Vitals:   02/21/14 1207  BP: 174/98  Pulse: 68  Height: 5\' 8"  (1.727 m)  Weight: 189 lb 3.2 oz (85.821 kg)    GEN- The patient is chronically ill appearing, alert and oriented x 3 today.   Head- normocephalic, atraumatic Eyes-  Sclera clear, conjunctiva pink Ears- hearing intact Oropharynx- clear Neck- supple, no JVP Lungs- Clear to ausculation bilaterally, normal work of breathing Chest- ICD pocket is well healed Heart- Regular rate and rhythm, no murmurs, rubs or gallops, PMI not laterally displaced GI- soft, NT, ND, + BS Extremities- s/p L  AKA  ICD interrogation- reviewed in detail today,  See PACEART report ekg today reveals afib, V paced at 70 bpm  Assessment and Plan:  1. Chronic systolic dysfunction Doing well s/p gen change See Pace Art report No changes today 99% BiV paced  2. Permanent afib Rate controlled Now on pradaxa.  CrCl is 40 (borderline for pradaxa).  He will need to have close creatinine follow-up by primary care.  Return to see Dr Tamala Julian in 3 months carelink Will enroll in [facility] device clinic with Alvis Lemmings  Return to see me in 1 year

## 2014-02-28 ENCOUNTER — Other Ambulatory Visit (INDEPENDENT_AMBULATORY_CARE_PROVIDER_SITE_OTHER): Payer: Medicare Other

## 2014-02-28 DIAGNOSIS — I482 Chronic atrial fibrillation, unspecified: Secondary | ICD-10-CM

## 2014-02-28 LAB — BASIC METABOLIC PANEL
BUN: 41 mg/dL — AB (ref 6–23)
CALCIUM: 8.7 mg/dL (ref 8.4–10.5)
CO2: 24 mEq/L (ref 19–32)
CREATININE: 2 mg/dL — AB (ref 0.4–1.5)
Chloride: 106 mEq/L (ref 96–112)
GFR: 35.54 mL/min — AB (ref >60.00)
Glucose, Bld: 146 mg/dL — ABNORMAL HIGH (ref 70–99)
Potassium: 4.2 mEq/L (ref 3.5–5.1)
Sodium: 138 mEq/L (ref 135–145)

## 2014-03-01 ENCOUNTER — Other Ambulatory Visit: Payer: Self-pay

## 2014-03-01 MED ORDER — DABIGATRAN ETEXILATE MESYLATE 150 MG PO CAPS: 150.0000 mg | ORAL_CAPSULE | Freq: Two times a day (BID) | ORAL | Status: DC

## 2014-03-05 ENCOUNTER — Other Ambulatory Visit: Payer: Self-pay | Admitting: Family

## 2014-03-07 ENCOUNTER — Ambulatory Visit: Payer: Medicare Other | Admitting: Family

## 2014-03-07 LAB — HM DIABETES EYE EXAM

## 2014-03-14 ENCOUNTER — Telehealth: Payer: Self-pay | Admitting: Family Medicine

## 2014-03-14 NOTE — Telephone Encounter (Signed)
Patient would like to know the status of his prothesis paperwork?  I spoke to [place] and she said it was sent up front to be faxed.  Patient is aware and said he will call prothesis company back.

## 2014-03-16 ENCOUNTER — Encounter
Payer: Commercial Managed Care - HMO | Attending: Physical Medicine & Rehabilitation | Admitting: Physical Medicine & Rehabilitation

## 2014-03-16 ENCOUNTER — Encounter: Payer: Self-pay | Admitting: Physical Medicine & Rehabilitation

## 2014-03-16 VITALS — BP 147/70 | HR 67 | Resp 14

## 2014-03-16 DIAGNOSIS — I482 Chronic atrial fibrillation, unspecified: Secondary | ICD-10-CM

## 2014-03-16 DIAGNOSIS — M79605 Pain in left leg: Secondary | ICD-10-CM

## 2014-03-16 DIAGNOSIS — G546 Phantom limb syndrome with pain: Secondary | ICD-10-CM

## 2014-03-16 DIAGNOSIS — S78112A Complete traumatic amputation at level between left hip and knee, initial encounter: Secondary | ICD-10-CM

## 2014-03-16 DIAGNOSIS — M79672 Pain in left foot: Secondary | ICD-10-CM

## 2014-03-16 DIAGNOSIS — Z89612 Acquired absence of left leg above knee: Secondary | ICD-10-CM

## 2014-03-16 MED ORDER — OXYCODONE-ACETAMINOPHEN 7.5-325 MG PO TABS: 1.0000 | ORAL_TABLET | Freq: Four times a day (QID) | ORAL | Status: DC | PRN

## 2014-03-16 MED ORDER — WARFARIN SODIUM 5 MG PO TABS: 5.0000 mg | ORAL_TABLET | ORAL | Status: DC

## 2014-03-16 NOTE — Progress Notes (Signed)
Subjective:    Patient ID: [name], male    DOB: [birth year], 80 y.o.   MRN: [id number]  HPI   [name] is back regarding his left AKA. He stopped therapy about a month ago and only had gotten to a walker level. He feels that therapy was stopped rather abruptly and he was really not given any feedback as to what was needed next. He's essentially doing HEP in his home twice daily.   He is working with prosthestist on a new suspension system, perhaps a suction suspension system.   He stopped the tegretol as he didn't feel it made a big difference. He is still taking the gabapentin. His percocet seems effective.  He states he rarely drinks etoh---maybe once a month. His last UDS was + for EToh  Pain Inventory Average Pain 7 Pain Right Now 5 My pain is sharp and tingling  In the last 24 hours, has pain interfered with the following? General activity 0 Relation with others 0 Enjoyment of life 7 What TIME of day is your pain at its worst? daytime, night Sleep (in general) Fair  Pain is worse with: sitting and some activites Pain improves with: medication Relief from Meds: 7  Mobility walk with assistance use a walker ability to climb steps?  yes do you drive?  yes transfers alone Do you have any goals in this area?  yes  Function retired  Neuro/Psych weakness trouble walking  Prior Studies Any changes since last visit?  no  Physicians involved in your care Any changes since last visit?  no   Family History  Problem Relation Age of Onset  . Cancer      breast/fhx  . Heart disease      fhx  . Diabetes Neg Hx   . Cancer Father    History   Social History  . Marital Status: Widowed    Spouse Name: N/A    Number of Children: N/A  . Years of Education: N/A   Occupational History  . Retired    Social History Main Topics  . Smoking status: Never Smoker   . Smokeless tobacco: Never Used     Comment: 1 cigar per week  . Alcohol Use: 2.4 oz/week     2 Glasses of wine, 2 Shots of liquor per week     Comment: maybe once a week  . Drug Use: No  . Sexual Activity: Not Currently   Other Topics Concern  . None   Social History Narrative   Lives in [place] with significant other,  [name].   Past Surgical History  Procedure Laterality Date  . Turp vaporization    . Cardiac defibrillator placement  12/26/09    pacemaker combo  . Cervical epidural injection  2013  . Femoral-tibial bypass graft  09/25/2011    Procedure: BYPASS GRAFT FEMORAL-TIBIAL ARTERY;  Surgeon: Mal Misty, MD;  Location: [facility] OR;  Service: Vascular;  Laterality: Left;  Left Femoral - Anterior Tibial Bypass;  saphenous vein graft from left leg  . Intraoperative arteriogram  09/25/2011    Procedure: INTRA OPERATIVE ARTERIOGRAM;  Surgeon: Mal Misty, MD;  Location: [place];  Service: Vascular;  Laterality: Left;  . Femoral-tibial bypass graft  02/07/2012    Procedure: BYPASS GRAFT FEMORAL-TIBIAL ARTERY;  Surgeon: Rosetta Posner, MD;  Location: [place];  Service: Vascular;  Laterality: Left;  Thrombectomy Left Femoral - Tibial Bypass Graft  . Embolectomy  02/07/2012    Procedure: EMBOLECTOMY;  Surgeon: Rosetta Posner, MD;  Location: [place];  Service: Vascular;  Laterality: Left;  . Femoral-tibial bypass graft  04/03/2012    Procedure: BYPASS GRAFT FEMORAL-TIBIAL ARTERY;  Surgeon: Mal Misty, MD;  Location: [place];  Service: Vascular;  Laterality: Left;  Redo  . Insert / replace / remove pacemaker  2007  . Coronary angioplasty with stent placement  ~ 2000  . Coronary angioplasty    . Uvulopalatopharyngoplasty (uppp)/tonsillectomy/septoplasty  06/30/2003    Archie Endo 06/30/2003 (07/10/2012)  . Shoulder open rotator cuff repair Right 2001    repair of lacerated right/notes 10/11/1999  (07/10/2012)  . Foot surgery Right 03/20/2001    "have plates and screws in; didn't break it" (07/10/2012)  . Carpal tunnel release Right 2002    Archie Endo 03/20/2001 (07/10/2012)  . Biceps tendon repair  Right 2001    Archie Endo 03/20/2001 (07/10/2012)  . Renal artery stent  2009  . Femoral-popliteal bypass graft Left 09/16/2012    Procedure: LEFT FEMORAL-POPLITEAL BYPASS GRAFT WITH GORTEX Propaten Graft 6x80 Thin Wall and Left lower leg Angiogram;  Surgeon: Mal Misty, MD;  Location: [place];  Service: Vascular;  Laterality: Left;  . Colonoscopy      Hx; of  . Tonsillectomy    . Adenoidectomy      Hx; of   . Femoral-tibial bypass graft Left 09/30/2012    Procedure: REDO LEFT FEMORAL-ANTERIOR TIBIAL ARTERY BYPASS USING COMPOSITE CEPHALIC AND BASILIC VEIN GRAFT FROM LEFT ARM;  Surgeon: Mal Misty, MD;  Location: [place];  Service: Vascular;  Laterality: Left;  . I&d extremity Left 10/21/2012    Procedure: EXPLORATION AND DEBRIDEMENT OF LEFT GROIN WOUND;  Surgeon: Mal Misty, MD;  Location: [place];  Service: Vascular;  Laterality: Left;  . Patch angioplasty Left 10/21/2012    Procedure: PATCH ANGIOPLASTY;  Surgeon: Mal Misty, MD;  Location: [place];  Service: Vascular;  Laterality: Left;  . Groin debridement Left 11/12/2012    Procedure: CLOSURE INGUINAL WOUND;  Surgeon: Mal Misty, MD;  Location: [place];  Service: Vascular;  Laterality: Left;  . Embolectomy Left 12/16/2012    Procedure: THROMBECTOMY  LEFT LEG BYPASS;  Surgeon: Elam Dutch, MD;  Location: [place];  Service: Vascular;  Laterality: Left;  . Leg amputation above knee Left 02/04/2013    DR LAWSON  . Amputation Left 02/04/2013    Procedure: AMPUTATION ABOVE KNEE-LEFT;  Surgeon: Mal Misty, MD;  Location: [place];  Service: Vascular;  Laterality: Left;  . Removal of graft Left 04/16/2013    Procedure: I & D LEFT AKA WOUND, POSSIBLE REMOVAL OF INFECTED GORTEX GRAFT;  Surgeon: Mal Misty, MD;  Location: [place];  Service: Vascular;  Laterality: Left;  . Abdominal aortagram N/A 09/11/2011    Procedure: ABDOMINAL Maxcine Ham;  Surgeon: Wellington Hampshire, MD;  Location: [facility] CATH LAB;  Service: Cardiovascular;  Laterality: N/A;  .  Lower extremity angiogram Left 12/25/2011    Procedure: LOWER EXTREMITY ANGIOGRAM;  Surgeon: Serafina Mitchell, MD;  Location: [facility] CATH LAB;  Service: Cardiovascular;  Laterality: Left;  lt leg angio co2  . Abdominal angiogram  12/25/2011    Procedure: ABDOMINAL ANGIOGRAM;  Surgeon: Serafina Mitchell, MD;  Location: [facility] CATH LAB;  Service: Cardiovascular;;  . Lower extremity angiogram N/A 07/07/2012    Procedure: LOWER EXTREMITY ANGIOGRAM;  Surgeon: Conrad San Marino, MD;  Location: [facility] CATH LAB;  Service: Cardiovascular;  Laterality: N/A;  . Lower extremity angiogram N/A 09/15/2012  Procedure: LOWER EXTREMITY ANGIOGRAM;  Surgeon: Serafina Mitchell, MD;  Location: [facility] CATH LAB;  Service: Cardiovascular;  Laterality: N/A;  . Upper extremity angiogram  09/15/2012    Procedure: UPPER EXTREMITY ANGIOGRAM;  Surgeon: Serafina Mitchell, MD;  Location: [facility] CATH LAB;  Service: Cardiovascular;;  . Biv icd genertaor change out N/A 11/09/2013    Procedure: BIV ICD GENERTAOR CHANGE OUT;  Surgeon: Coralyn Mark, MD;  Location: [facility] CATH LAB;  Service: Cardiovascular;  Laterality: N/A;   Past Medical History  Diagnosis Date  . Gout   . Benign prostatic hypertrophy     takes Proscar daily  . Atrial fibrillation     takes Warfarin daily  . Chronic systolic dysfunction of left ventricle     a. mixed ischemic and nonischemic CM,  EF 35%. b. s/p AICD implantation.  . ED (erectile dysfunction)   . Arthritis   . Pacemaker     medtronic  . CAD (coronary artery disease)     a. s/p mid LAD stenting with DES 2008 Dr. Daneen Schick  . ICD (implantable cardiac defibrillator) in place     medtronic, Dr. Rayann Heman  . ICD (implantable cardiac defibrillator) in place   . Sleep apnea     hx of "had surgery for"  . Automatic implantable cardioverter-defibrillator in situ   . Depression   . Numbness and tingling     Hx; 34f left foot  . PAD (peripheral artery disease)     Severe by PV angiogram 09/2011  . Renal artery stenosis     s/p  stenting 2009  . PAD (peripheral artery disease)     s/p multiple LLE bypass grafts; left mid-distal SCA occlusion by 08/2012 duplex  . Hypertension     takes Carvedilol and Losartan daily  . Constipation     takes Miralax daily as needed and Colace daily   . Type II diabetes mellitus     takes Novolog 70/30  . CHF (congestive heart failure)     takes Lasix daily  . History of MRSA infection   . Insomnia     TAKES TRAZODONE NIGHTLY   BP 147/70 mmHg  Pulse 67  Resp 14  SpO2 98%  Opioid Risk Score:   Fall Risk Score: Moderate Fall Risk (6-13 points) (pt has rec'd fall safety prevention pamphlet during previous visit) Review of Systems  Musculoskeletal: Positive for gait problem.  Neurological: Positive for weakness.  All other systems reviewed and are negative.      Objective:   Physical Exam  Constitutional: He is oriented to person, place, and time. He appears well-developed and well-nourished.  HENT:  Head: Normocephalic and atraumatic.  Eyes: Conjunctivae are normal. Pupils are equal, round, and reactive to light.  Neck: Neck supple.  Cardiovascular: Normal rate and regular rhythm. No rubs, gallops. Pacer site dressed  No murmur heard.  Respiratory: Effort normal and breath sounds normal. No respiratory distress. He has no wheezes.  GI: Soft. Bowel sounds are normal. He exhibits no distension. There is no tenderness.  Musculoskeletal:  L-AKA prosthesis still in place but appears loose. Rotation noted. Limb has shrunk in size. Mild warmth and erythema over right and left MCP's. Tender to palpation also.  Neurological: He is alert and oriented to person, place, and time.  Follows commands. Strength grossly intact in UE's at 5/5. RLE is 4/5 prox to 5/5 distally. Left HF 4/5. mild sensory loss in distal right leg/foot.  Skin: Intact.  Psychiatric: He has a  normal mood and affect. His speech is normal and behavior is normal. Judgment and thought content normal. Cognition and  memory are normal.    Assessment/Plan:  1. Functional deficits secondary to left AKA.   2. Pain Management: cannot tolerate increased gabapentin  -dc tegretol  -continue 7.5mg   -Consider long acting opiate  -needs socket revison, new suspension---suction?  3. Follow up with me in about 2 months . 15 minutes of face to face patient care time were spent during this visit. All questions were encouraged and answered.  4. Gout---resumed allopurinol  -give some colchicine for prn breakthrough

## 2014-03-16 NOTE — Patient Instructions (Signed)
PLEASE CALL ME WITH ANY PROBLEMS OR QUESTIONS (#[phone]).      

## 2014-03-23 ENCOUNTER — Telehealth: Payer: Self-pay | Admitting: Family

## 2014-03-23 NOTE — Telephone Encounter (Signed)
Needs PT/INR visit asap

## 2014-03-23 NOTE — Telephone Encounter (Signed)
Pt has been sch for 02-07-15 °

## 2014-03-24 ENCOUNTER — Encounter: Payer: Self-pay | Admitting: *Deleted

## 2014-03-24 ENCOUNTER — Ambulatory Visit (INDEPENDENT_AMBULATORY_CARE_PROVIDER_SITE_OTHER): Payer: PRIVATE HEALTH INSURANCE | Admitting: *Deleted

## 2014-03-24 DIAGNOSIS — Z95810 Presence of automatic (implantable) cardiac defibrillator: Secondary | ICD-10-CM

## 2014-03-24 DIAGNOSIS — I5022 Chronic systolic (congestive) heart failure: Secondary | ICD-10-CM

## 2014-03-24 NOTE — Progress Notes (Addendum)
EPIC Encounter for ICM Monitoring  Patient Name: [name] is a 81 y.o. male Date: 03/24/2014 Primary Care Physican: Laurey Morale, MD Primary Cardiologist: Tamala Julian Electrophysiologist: Allred Dry Weight: 181 lbs (with leg prosthesis)       In the past month, have you:  1. Gained more than 2 pounds in a day or more than 5 pounds in a week? no  2. Had changes in your medications (with verification of current medications)? no  3. Had more shortness of breath than is usual for you? no  4. Limited your activity because of shortness of breath? no  5. Not been able to sleep because of shortness of breath? no  6. Had increased swelling in your feet or ankles? no  7. Had symptoms of dehydration (dizziness, dry mouth, increased thirst, decreased urine output) no  8. Had changes in sodium restriction? no  9. Been compliant with medication? Yes   ICM trend:   Follow-up plan: ICM clinic phone appointment: 04/25/14. The patient's optivol readings have been up slightly from ~ 1/3 to present. Impedence seems to have leveled off. The patient is asymptomatic today. His last BMP on 02/28/14- K+/ BUN/ Creatinine- 4.2/ 41/2.0. Creatinine is fairly stable for him. No changes made today. He is currently not on any diuretics.   Copy of note sent to patient's primary care physician, primary cardiologist, and device following physician.  Alvis Lemmings, RN, BSN 03/24/2014 12:59 PM

## 2014-03-27 ENCOUNTER — Telehealth: Payer: Self-pay | Admitting: Family Medicine

## 2014-03-28 ENCOUNTER — Ambulatory Visit (INDEPENDENT_AMBULATORY_CARE_PROVIDER_SITE_OTHER): Payer: Commercial Managed Care - HMO | Admitting: Family

## 2014-03-28 ENCOUNTER — Encounter: Payer: Self-pay | Admitting: Family Medicine

## 2014-03-28 ENCOUNTER — Ambulatory Visit (INDEPENDENT_AMBULATORY_CARE_PROVIDER_SITE_OTHER): Payer: Commercial Managed Care - HMO | Admitting: Family Medicine

## 2014-03-28 VITALS — BP 146/89 | HR 63 | Temp 98.0°F

## 2014-03-28 DIAGNOSIS — I10 Essential (primary) hypertension: Secondary | ICD-10-CM | POA: Diagnosis not present

## 2014-03-28 DIAGNOSIS — I482 Chronic atrial fibrillation, unspecified: Secondary | ICD-10-CM

## 2014-03-28 DIAGNOSIS — Z5181 Encounter for therapeutic drug level monitoring: Secondary | ICD-10-CM | POA: Diagnosis not present

## 2014-03-28 DIAGNOSIS — I2510 Atherosclerotic heart disease of native coronary artery without angina pectoris: Secondary | ICD-10-CM

## 2014-03-28 LAB — POCT INR: INR: 1.9

## 2014-03-28 MED ORDER — AMLODIPINE BESYLATE 5 MG PO TABS: 5.0000 mg | ORAL_TABLET | Freq: Every day | ORAL | Status: DC

## 2014-03-28 NOTE — Telephone Encounter (Signed)
PLEASE NOTE: All timestamps contained within this report are represented as [place] Standard Time. CONFIDENTIALTY NOTICE: This fax transmission is intended only for the addressee. It contains information that is legally privileged, confidential or otherwise protected from use or disclosure. If you are not the intended recipient, you are strictly prohibited from reviewing, disclosing, copying using or disseminating any of this information or taking any action in reliance on or regarding this information. If you have received this fax in error, please notify us immediately by telephone so that we can arrange for its return to [place]. Phone: [phone], Toll-Free: [phone], Fax: [phone] Page: 1 of 2 Call Id: [id number] [facility] - Client [place] Patient Name: [name] Gender: Male DOB: [birth year] Age: 80 Y 10 D Return Phone Number: [phone] (Primary) Address: City/State/Zip: [place] [address] Client [facility] - Client Client Site [facility] - Night Physician [name] Contact Type Call Call Type Triage / Clinical Relationship To Patient Self Return Phone Number [phone] (Primary) Chief Complaint Medication reaction Initial Comment Caller states he is taking Gabapentin and has been having tremors in his hands. Caller states he can not control his hands, for example tried to fill up k[place] and spilled water every where because of hands shaking. PreDisposition InappropriateToAsk Nurse Assessment Nurse: Loletta Specter, RN, Wells Guiles Date/Time Eilene Ghazi Time): 03/27/2014 2:41:18 PM Confirm and document reason for call. If symptomatic, describe symptoms. ---Caller states he is taking Gabapentin for a year for neuropathy and has been having tremors in his hands. Caller states he can not control his hands, for example tried to fill up k[place]g and spilled water  every where because of hands shaking. Has the patient traveled out of the country within the last 30 days? ---Not Applicable Does the patient require triage? ---Yes Related visit to physician within the last 2 weeks? ---No Does the PT have any chronic conditions? (i.e. diabetes, asthma, etc.) ---Yes List chronic conditions. ---diabetes. Guidelines Guideline Title Affirmed Question Affirmed Notes Nurse Date/Time (Eastern Time) Neurologic Deficit Can't use hand normally (e.g., make a fist, open fully, hold a glass of water) Loletta Specter, RN, Wells Guiles 03/27/2014 2:42:36 PM Disp. Time Eilene Ghazi Time) Disposition Final User 03/27/2014 2:43:58 PM Go to ED Now Yes Loletta Specter, RN, Romualdo Bolk Understands: Yes PLEASE NOTE: All timestamps contained within this report are represented as [place] Standard Time. CONFIDENTIALTY NOTICE: This fax transmission is intended only for the addressee. It contains information that is legally privileged, confidential or otherwise protected from use or disclosure. If you are not the intended recipient, you are strictly prohibited from reviewing, disclosing, copying using or disseminating any of this information or taking any action in reliance on or regarding this information. If you have received this fax in error, please notify us immediately by telephone so that we can arrange for its return to [place]. Phone: [phone], Toll-Free: [phone], Fax: [phone] Page: 2 of 2 Call Id: [id number] Disagree/Comply: Disagree Disagree/Comply Reason: Disagree with instructions Care Advice Given Per Guideline GO TO ED NOW: You need to be seen in the Emergency Department. Go to the ER at ___________ [place] now. Drive carefully. DRIVING: Another adult should drive. CARE ADVICE given per Neurologic Deficit (Adult) guideline. After Care Instructions Given Call Event Type User Date / Time Description

## 2014-03-28 NOTE — Telephone Encounter (Signed)
He is scheduled to be seen here this afternoon

## 2014-03-28 NOTE — Patient Instructions (Signed)
1/2 tablet everyday. Recheck in 4 weeks   Anticoagulation Dose Instructions as of 03/28/2014      Dorene Grebe Tue Wed Thu Fri Sat   New Dose 2.5 mg 2.5 mg 2.5 mg 2.5 mg 2.5 mg 2.5 mg 2.5 mg    Description        1/2 tablet everyday. Recheck in 4 weeks

## 2014-03-28 NOTE — Progress Notes (Signed)
   Subjective:    Patient ID: [name], male    DOB: December 05, 1934, 80 y.o.   MRN: [id number]  HPI Here for elevated BPs over the last week. He has measured in the 196Q systolic for the past few months. He admits to stopping his Lasix one month ago because he did not think he needed it. He denies any foot or leg swelling. His BP at home yesterday was 213/82 and the triage nurse advised him to go to the ED, however he declined. He started taking the Lasix again last night. He has had a mild HA for a few days but has had no chest pain or SOB. His BP here initially was 146/89, but when I retook this 15 minutes later it was 162/100.    Review of Systems  Constitutional: Negative.   Respiratory: Negative.   Cardiovascular: Negative.   Neurological: Positive for headaches. Negative for dizziness, tremors, seizures, syncope, facial asymmetry, speech difficulty, weakness, light-headedness and numbness.       Objective:   Physical Exam  Constitutional: He is oriented to person, place, and time. He appears well-developed and well-nourished.  Cardiovascular: Normal rate, regular rhythm, normal heart sounds and intact distal pulses.   Pulmonary/Chest: Effort normal and breath sounds normal.  Neurological: He is alert and oriented to person, place, and time.          Assessment & Plan:  He needs to stay on the Lasix and he understands this. We will also add Amlodipine 5 mg daily. Recheck in 3 weeks

## 2014-03-28 NOTE — Progress Notes (Signed)
Pre visit review using our clinic review tool, if applicable. No additional management support is needed unless otherwise documented below in the visit note. 

## 2014-03-28 NOTE — Telephone Encounter (Signed)
PLEASE NOTE: All timestamps contained within this report are represented as [place] Standard Time. CONFIDENTIALTY NOTICE: This fax transmission is intended only for the addressee. It contains information that is legally privileged, confidential or otherwise protected from use or disclosure. If you are not the intended recipient, you are strictly prohibited from reviewing, disclosing, copying using or disseminating any of this information or taking any action in reliance on or regarding this information. If you have received this fax in error, please notify us immediately by telephone so that we can arrange for its return to [place]. Phone: [phone], Toll-Free: [phone], Fax: [phone] Page: 1 of 2 Call Id: [id number] [facility] - Client [place] Patient Name: [name] Gender: Male DOB: March 23, 1934 Age: 80 Y 11 D Return Phone Number: [phone] (Primary) Address: City/State/Zip: [place] [address] Client [facility] - Client Client Site [facility] - Night Physician Fry, [place] Type Call Call Type Triage / Clinical Relationship To Patient Self Return Phone Number [phone] (Primary) Chief Complaint Medication Question (non symptomatic) Initial Comment Caller states he has not been taking his water pill for a week and a half. He has taken one and would like to know if it is okay to take another. PreDisposition Call Doctor Nurse Assessment Nurse: Alvis Lemmings, RN, Marcie Bal Date/Time Eilene Ghazi Time): 03/27/2014 8:32:56 PM Confirm and document reason for call. If symptomatic, describe symptoms. ---Caller states no taking his water pill for a week and a half. He has taken one and would like to know if it is okay to take another. Headache is mild. BP 213/97. Blurry eyesight. Has the patient traveled out of the country within the last 30 days? ---Not  Applicable Does the patient require triage? ---Yes Related visit to physician within the last 2 weeks? ---Yes Does the PT have any chronic conditions? (i.e. diabetes, asthma, etc.) ---Yes List chronic conditions. 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 Time Eilene Ghazi Time) Disposition Final User 03/27/2014 8:46:35 PM Go to ED Now Yes Alvis Lemmings, RN, Marcie Bal PLEASE NOTE: All timestamps contained within this report are represented as [place] Standard Time. CONFIDENTIALTY NOTICE: This fax transmission is intended only for the addressee. It contains information that is legally privileged, confidential or otherwise protected from use or disclosure. If you are not the intended recipient, you are strictly prohibited from reviewing, disclosing, copying using or disseminating any of this information or taking any action in reliance on or regarding this information. If you have received this fax in error, please notify us immediately by telephone so that we can arrange for its return to [place]. Phone: [phone], Toll-Free: [phone], Fax: [phone] Page: 2 of 2 Call Id: [id number] Caller Understands: Yes Disagree/Comply: Disagree Disagree/Comply Reason: Wait and see Care Advice Given Per Guideline GO TO ED NOW: You need to be seen in the Emergency Department. Go to the ER at ___________ [place] now. Drive carefully. NOTE TO TRIAGER - DRIVING: * Another adult should drive. * If immediate transportation is not available via car or taxi, then the patient should be instructed to call [id number]. CARE ADVICE given  per High Blood Pressure (Adult) guideline.  CALL EMS 911 IF: * You become worse. * Patient passes out, starts acting confused or becomes too weak to stand. After Care Instructions Given Call Event Type User Date / Time Description Referrals REFERRED TO PCP OFFICE

## 2014-03-29 ENCOUNTER — Ambulatory Visit: Payer: PRIVATE HEALTH INSURANCE | Admitting: Family

## 2014-04-01 ENCOUNTER — Telehealth: Payer: Self-pay | Admitting: Internal Medicine

## 2014-04-01 NOTE — Telephone Encounter (Signed)
New  Msg       Pt having left arm discomfort and would like to know if it may be related to his device.     Please return pt call.

## 2014-04-01 NOTE — Telephone Encounter (Signed)
Called patient and spoke with him.  He describes a discomfort in his left arm that was throbbing in the arm lasting a few hours.  Pain was from shoulder to his elbow, pain was a 6 on a 0-10 scale.  Feels it is going away now.  No other symptoms.  He did not take anything but says now with movement it does not hurt whereas before it did hurt worse with movement.  I advised him if this pain came back and did not go away or he is worried about it he can go to the ER over the weekend.  If it;s during the week give [place] a call back and will discuss getting him in to be seen

## 2014-04-12 ENCOUNTER — Telehealth: Payer: Self-pay

## 2014-04-12 NOTE — Telephone Encounter (Signed)
Also LOSARTAN 100MG  TAB AND AMLODIPINE 5MG  TAB

## 2014-04-12 NOTE — Telephone Encounter (Signed)
ACCU-CHEK FASTCLIX LANCETS

## 2014-04-12 NOTE — Telephone Encounter (Signed)
[facility] refill request for ACCU-CHEK NANO SMARTVIEW METER, ACCU-CHEK SMARTVIEW TEST STRIPS, and WAFARIN 5MG  TABLET

## 2014-04-14 ENCOUNTER — Other Ambulatory Visit: Payer: Self-pay | Admitting: General Practice

## 2014-04-14 DIAGNOSIS — I482 Chronic atrial fibrillation, unspecified: Secondary | ICD-10-CM

## 2014-04-14 MED ORDER — WARFARIN SODIUM 5 MG PO TABS: ORAL_TABLET | ORAL | Status: DC

## 2014-04-14 NOTE — Telephone Encounter (Signed)
Can you take care of the Coumadin refill request and I will take care of ordering the diabetic supplies?

## 2014-04-15 MED ORDER — LOSARTAN POTASSIUM 100 MG PO TABS
100.0000 mg | ORAL_TABLET | Freq: Every day | ORAL | Status: DC
Start: 2014-04-15 — End: 2014-04-28

## 2014-04-15 NOTE — Addendum Note (Signed)
Addended by: Aggie Hacker A on: 04/15/2014 02:59 PM   Modules accepted: Orders

## 2014-04-15 NOTE — Telephone Encounter (Signed)
Scripts for medications were sent e-scribe, however I spoke with pt and he does not want to try the Accu chek right now. He still has plenty of supplies for the OneTouch.

## 2014-04-19 ENCOUNTER — Telehealth: Payer: Self-pay | Admitting: Family Medicine

## 2014-04-19 NOTE — Telephone Encounter (Signed)
Pt did not a message snet

## 2014-04-25 ENCOUNTER — Telehealth: Payer: Self-pay | Admitting: Family Medicine

## 2014-04-25 ENCOUNTER — Ambulatory Visit (INDEPENDENT_AMBULATORY_CARE_PROVIDER_SITE_OTHER): Payer: PRIVATE HEALTH INSURANCE | Admitting: *Deleted

## 2014-04-25 ENCOUNTER — Ambulatory Visit (INDEPENDENT_AMBULATORY_CARE_PROVIDER_SITE_OTHER): Payer: PRIVATE HEALTH INSURANCE | Admitting: General Practice

## 2014-04-25 DIAGNOSIS — I5022 Chronic systolic (congestive) heart failure: Secondary | ICD-10-CM | POA: Diagnosis not present

## 2014-04-25 DIAGNOSIS — Z5181 Encounter for therapeutic drug level monitoring: Secondary | ICD-10-CM

## 2014-04-25 DIAGNOSIS — Z95810 Presence of automatic (implantable) cardiac defibrillator: Secondary | ICD-10-CM

## 2014-04-25 LAB — POCT INR: INR: 2.1

## 2014-04-25 NOTE — Telephone Encounter (Signed)
Patient states that he need the following prescription refilled by mailed order: Lasix, Losartan, Finastende and Carvedilol. Please call

## 2014-04-25 NOTE — Progress Notes (Signed)
Pre visit review using our clinic review tool, if applicable. No additional management support is needed unless otherwise documented below in the visit note. 

## 2014-04-27 ENCOUNTER — Encounter: Payer: Self-pay | Admitting: *Deleted

## 2014-04-27 ENCOUNTER — Telehealth: Payer: Self-pay | Admitting: *Deleted

## 2014-04-27 NOTE — Telephone Encounter (Signed)
I spoke with the patient. 

## 2014-04-27 NOTE — Telephone Encounter (Signed)
ICM transmission received. I left a message for the patient to call. 

## 2014-04-27 NOTE — Progress Notes (Signed)
EPIC Encounter for ICM Monitoring  Patient Name: [name] is a 80 y.o. male Date: 04/27/2014 Primary Care Physican: Laurey Morale, MD Primary Cardiologist: Tamala Julian Electrophysiologist: Allred Dry Weight: 181 lbs (with prosthesis)        In the past month, have you:  1. Gained more than 2 pounds in a day or more than 5 pounds in a week? No. The patient weighed yesterday without his prosthesis- weight was 174 lbs. It depends on where he is as to weather he weighs with/ without his prosthesis.   2. Had changes in your medications (with verification of current medications)? no  3. Had more shortness of breath than is usual for you? no  4. Limited your activity because of shortness of breath? no  5. Not been able to sleep because of shortness of breath? no  6. Had increased swelling in your feet or ankles? no  7. Had symptoms of dehydration (dizziness, dry mouth, increased thirst, decreased urine output) Possibly small increase in thirst.   8. Had changes in sodium restriction? no  9. Been compliant with medication? Yes   ICM trend:   Follow-up plan: ICM clinic phone appointment: 05/30/14. The patient's impedence has been slightly above baseline, but appears to be returning to normal. The patient states he is maybe a little more thirsty than usual. I have advised him to decrease his lasix to 1/2 tablet tomorrow, then resume his normal dosing. He verbalizes understanding.   Copy of note sent to patient's primary care physician, primary cardiologist, and device following physician.  Alvis Lemmings, RN, BSN 04/27/2014 3:05 PM

## 2014-04-28 ENCOUNTER — Telehealth: Payer: Self-pay | Admitting: Family Medicine

## 2014-04-28 ENCOUNTER — Other Ambulatory Visit: Payer: Self-pay | Admitting: General Practice

## 2014-04-28 DIAGNOSIS — I482 Chronic atrial fibrillation, unspecified: Secondary | ICD-10-CM

## 2014-04-28 MED ORDER — LOSARTAN POTASSIUM 100 MG PO TABS
100.0000 mg | ORAL_TABLET | Freq: Every day | ORAL | Status: DC
Start: 2014-04-28 — End: 2014-05-23

## 2014-04-28 MED ORDER — FUROSEMIDE 40 MG PO TABS: 40.0000 mg | ORAL_TABLET | Freq: Every day | ORAL | Status: DC

## 2014-04-28 MED ORDER — WARFARIN SODIUM 5 MG PO TABS: ORAL_TABLET | ORAL | Status: DC

## 2014-04-28 MED ORDER — FINASTERIDE 5 MG PO TABS: 5.0000 mg | ORAL_TABLET | Freq: Every day | ORAL | Status: DC

## 2014-04-28 MED ORDER — AMLODIPINE BESYLATE 5 MG PO TABS: 5.0000 mg | ORAL_TABLET | Freq: Every day | ORAL | Status: DC

## 2014-04-28 MED ORDER — CARVEDILOL 25 MG PO TABS: 25.0000 mg | ORAL_TABLET | Freq: Two times a day (BID) | ORAL | Status: DC

## 2014-04-28 NOTE — Telephone Encounter (Signed)
Refill request for a 90 day supply of Warfarin and send to [facility] mail order pharmacy.

## 2014-04-28 NOTE — Telephone Encounter (Signed)
Refill request from [facility] for Losartan, Amlodipine, Lasix, Finasteride & Carvedilol. Per Dr. Sarajane Jews okay to refill and I did send all 5 scripts e-scribe.

## 2014-05-04 ENCOUNTER — Telehealth: Payer: Self-pay

## 2014-05-04 NOTE — Telephone Encounter (Signed)
[facility] refill for GABAPENTIN 300MG  CAP

## 2014-05-04 NOTE — Telephone Encounter (Signed)
Also for [place]

## 2014-05-05 MED ORDER — GABAPENTIN 300 MG PO CAPS: 300.0000 mg | ORAL_CAPSULE | Freq: Three times a day (TID) | ORAL | Status: DC

## 2014-05-05 NOTE — Addendum Note (Signed)
Addended by: Aggie Hacker A on: 05/05/2014 01:53 PM   Modules accepted: Orders

## 2014-05-05 NOTE — Telephone Encounter (Signed)
I spoke with pt and he does not need machine right now. I did send script e-scribe for Gabapentin.

## 2014-05-09 ENCOUNTER — Telehealth: Payer: Self-pay | Admitting: Family Medicine

## 2014-05-09 NOTE — Telephone Encounter (Signed)
[facility] and would [facility] to know what dr fry thought of the results.  Pt faxed results about 10 days ago.

## 2014-05-10 NOTE — Telephone Encounter (Signed)
Sorry but I do not recall seeing these results. They are probably in the process of being scanned now

## 2014-05-10 NOTE — Telephone Encounter (Addendum)
Pt got all meds.

## 2014-05-10 NOTE — Telephone Encounter (Signed)
I left a message for pt with below information.

## 2014-05-17 ENCOUNTER — Ambulatory Visit: Payer: PRIVATE HEALTH INSURANCE | Admitting: Physical Medicine & Rehabilitation

## 2014-05-18 ENCOUNTER — Encounter: Payer: Self-pay | Admitting: Physical Therapy

## 2014-05-18 ENCOUNTER — Ambulatory Visit: Payer: Commercial Managed Care - HMO | Attending: Family Medicine | Admitting: Physical Therapy

## 2014-05-18 DIAGNOSIS — R2689 Other abnormalities of gait and mobility: Secondary | ICD-10-CM

## 2014-05-18 DIAGNOSIS — Z7409 Other reduced mobility: Secondary | ICD-10-CM

## 2014-05-18 DIAGNOSIS — R29818 Other symptoms and signs involving the nervous system: Secondary | ICD-10-CM | POA: Diagnosis not present

## 2014-05-18 DIAGNOSIS — M24652 Ankylosis, left hip: Secondary | ICD-10-CM | POA: Diagnosis not present

## 2014-05-18 DIAGNOSIS — Z89612 Acquired absence of left leg above knee: Secondary | ICD-10-CM | POA: Insufficient documentation

## 2014-05-18 DIAGNOSIS — R269 Unspecified abnormalities of gait and mobility: Secondary | ICD-10-CM | POA: Diagnosis present

## 2014-05-18 DIAGNOSIS — R531 Weakness: Secondary | ICD-10-CM | POA: Diagnosis not present

## 2014-05-18 NOTE — Therapy (Signed)
[facility] [address] [place], [place], [address] Phone: [phone]   Fax:  [phone]  Physical Therapy Evaluation  Patient Details  Name: MINH ROANHORSE MRN: [id number] Date of Birth: [birth year] Referring Provider:  Laurey Morale, MD  Encounter Date: 05/18/2014      PT End of Session - 05/18/14 0845    Visit Number 1   Number of Visits 17   Date for PT Re-Evaluation 07/15/04   PT Start Time 0800   PT Stop Time 0845   PT Time Calculation (min) 45 min   Equipment Utilized During Treatment Gait belt   Activity Tolerance Patient limited by fatigue   Behavior During Therapy [facility] for tasks assessed/performed      Past Medical History  Diagnosis Date  . Gout   . Benign prostatic hypertrophy     takes Proscar daily  . Atrial fibrillation     takes Warfarin daily  . Chronic systolic dysfunction of left ventricle     a. mixed ischemic and nonischemic CM,  EF 35%. b. s/p AICD implantation.  . ED (erectile dysfunction)   . Arthritis   . Pacemaker     medtronic  . CAD (coronary artery disease)     a. s/p mid LAD stenting with DES 2008 Dr. Daneen Schick  . ICD (implantable cardiac defibrillator) in place     medtronic, Dr. Rayann Heman  . ICD (implantable cardiac defibrillator) in place   . Sleep apnea     hx of "had surgery for"  . Automatic implantable cardioverter-defibrillator in situ   . Depression   . Numbness and tingling     Hx; 68f left foot  . PAD (peripheral artery disease)     Severe by PV angiogram 09/2011  . Renal artery stenosis     s/p stenting 2009  . PAD (peripheral artery disease)     s/p multiple LLE bypass grafts; left mid-distal SCA occlusion by 08/2012 duplex  . Hypertension     takes Carvedilol and Losartan daily  . Constipation     takes Miralax daily as needed and Colace daily   . Type II diabetes mellitus     takes Novolog 70/30  . CHF (congestive heart failure)     takes Lasix  daily  . History of MRSA infection   . Insomnia     TAKES TRAZODONE NIGHTLY    Past Surgical History  Procedure Laterality Date  . Turp vaporization    . Cardiac defibrillator placement  12/26/09    pacemaker combo  . Cervical epidural injection  2013  . Femoral-tibial bypass graft  09/25/2011    Procedure: BYPASS GRAFT FEMORAL-TIBIAL ARTERY;  Surgeon: Mal Misty, MD;  Location: [facility] OR;  Service: Vascular;  Laterality: Left;  Left Femoral - Anterior Tibial Bypass;  saphenous vein graft from left leg  . Intraoperative arteriogram  09/25/2011    Procedure: INTRA OPERATIVE ARTERIOGRAM;  Surgeon: Mal Misty, MD;  Location: [place];  Service: Vascular;  Laterality: Left;  . Femoral-tibial bypass graft  02/07/2012    Procedure: BYPASS GRAFT FEMORAL-TIBIAL ARTERY;  Surgeon: Rosetta Posner, MD;  Location: [place];  Service: Vascular;  Laterality: Left;  Thrombectomy Left Femoral - Tibial Bypass Graft  . Embolectomy  02/07/2012    Procedure: EMBOLECTOMY;  Surgeon: Rosetta Posner, MD;  Location: [place];  Service: Vascular;  Laterality: Left;  . Femoral-tibial bypass graft  04/03/2012    Procedure: BYPASS GRAFT FEMORAL-TIBIAL ARTERY;  Surgeon: Mal Misty, MD;  Location: [place];  Service: Vascular;  Laterality: Left;  Redo  . Insert / replace / remove pacemaker  2007  . Coronary angioplasty with stent placement  ~ 2000  . Coronary angioplasty    . Uvulopalatopharyngoplasty (uppp)/tonsillectomy/septoplasty  06/30/2003    Archie Endo 06/30/2003 (07/10/2012)  . Shoulder open rotator cuff repair Right 2001    repair of lacerated right/notes 10/11/1999  (07/10/2012)  . Foot surgery Right 03/20/2001    "have plates and screws in; didn't break it" (07/10/2012)  . Carpal tunnel release Right 2002    Archie Endo 03/20/2001 (07/10/2012)  . Biceps tendon repair Right 2001    Archie Endo 03/20/2001 (07/10/2012)  . Renal artery stent  2009  . Femoral-popliteal bypass graft Left 09/16/2012    Procedure: LEFT FEMORAL-POPLITEAL BYPASS GRAFT  WITH GORTEX Propaten Graft 6x80 Thin Wall and Left lower leg Angiogram;  Surgeon: Mal Misty, MD;  Location: [place];  Service: Vascular;  Laterality: Left;  . Colonoscopy      Hx; of  . Tonsillectomy    . Adenoidectomy      Hx; of   . Femoral-tibial bypass graft Left 09/30/2012    Procedure: REDO LEFT FEMORAL-ANTERIOR TIBIAL ARTERY BYPASS USING COMPOSITE CEPHALIC AND BASILIC VEIN GRAFT FROM LEFT ARM;  Surgeon: Mal Misty, MD;  Location: [place];  Service: Vascular;  Laterality: Left;  . I&d extremity Left 10/21/2012    Procedure: EXPLORATION AND DEBRIDEMENT OF LEFT GROIN WOUND;  Surgeon: Mal Misty, MD;  Location: [place];  Service: Vascular;  Laterality: Left;  . Patch angioplasty Left 10/21/2012    Procedure: PATCH ANGIOPLASTY;  Surgeon: Mal Misty, MD;  Location: [place];  Service: Vascular;  Laterality: Left;  . Groin debridement Left 11/12/2012    Procedure: CLOSURE INGUINAL WOUND;  Surgeon: Mal Misty, MD;  Location: [place];  Service: Vascular;  Laterality: Left;  . Embolectomy Left 12/16/2012    Procedure: THROMBECTOMY  LEFT LEG BYPASS;  Surgeon: Elam Dutch, MD;  Location: [place];  Service: Vascular;  Laterality: Left;  . Leg amputation above knee Left 02/04/2013    DR LAWSON  . Amputation Left 02/04/2013    Procedure: AMPUTATION ABOVE KNEE-LEFT;  Surgeon: Mal Misty, MD;  Location: [place];  Service: Vascular;  Laterality: Left;  . Removal of graft Left 04/16/2013    Procedure: I & D LEFT AKA WOUND, POSSIBLE REMOVAL OF INFECTED GORTEX GRAFT;  Surgeon: Mal Misty, MD;  Location: [place];  Service: Vascular;  Laterality: Left;  . Abdominal aortagram N/A 09/11/2011    Procedure: ABDOMINAL Maxcine Ham;  Surgeon: Wellington Hampshire, MD;  Location: [facility] CATH LAB;  Service: Cardiovascular;  Laterality: N/A;  . Lower extremity angiogram Left 12/25/2011    Procedure: LOWER EXTREMITY ANGIOGRAM;  Surgeon: Serafina Mitchell, MD;  Location: [facility] CATH LAB;  Service: Cardiovascular;   Laterality: Left;  lt leg angio co2  . Abdominal angiogram  12/25/2011    Procedure: ABDOMINAL ANGIOGRAM;  Surgeon: Serafina Mitchell, MD;  Location: [facility] CATH LAB;  Service: Cardiovascular;;  . Lower extremity angiogram N/A 07/07/2012    Procedure: LOWER EXTREMITY ANGIOGRAM;  Surgeon: Conrad La Crosse, MD;  Location: [facility] CATH LAB;  Service: Cardiovascular;  Laterality: N/A;  . Lower extremity angiogram N/A 09/15/2012    Procedure: LOWER EXTREMITY ANGIOGRAM;  Surgeon: Serafina Mitchell, MD;  Location: [facility] CATH LAB;  Service: Cardiovascular;  Laterality: N/A;  . Upper extremity angiogram  09/15/2012  Procedure: UPPER EXTREMITY ANGIOGRAM;  Surgeon: Serafina Mitchell, MD;  Location: [facility] CATH LAB;  Service: Cardiovascular;;  . Biv icd genertaor change out N/A 11/09/2013    Procedure: BIV ICD GENERTAOR CHANGE OUT;  Surgeon: Coralyn Mark, MD;  Location: [facility] CATH LAB;  Service: Cardiovascular;  Laterality: N/A;    There were no vitals filed for this visit.  Visit Diagnosis:  Abnormality of gait  Impaired functional mobility and activity tolerance  Status post above knee amputation of left lower extremity  Weakness generalized  Decreased range of motion of hip, left  Balance problems      Subjective Assessment - 05/18/14 0805    Symptoms This 80yo male underwent a left Transfemoral Amputation 02/04/2013. He recieved first prosthesis ~June 2015 and underwent PT. He recieved socket revision with change in suspension from velcro strap to seal in ring suspension. He recieved new socket 05/09/14. He presents to PT for evaluation.    Patient Stated Goals He wants to walk with prosthesis without w/c or walker.   Currently in Pain? Yes   Pain Score 6   in last week, worst 8/10, best 4/10   Pain Location Hand   Pain Orientation Right;Left   Pain Descriptors / Indicators Aching;Tingling   Pain Type Chronic pain;Other (Comment)  gout   Pain Onset More than a month ago   Pain Frequency Constant   Aggravating  Factors  moving it, making a fist   Pain Relieving Factors support, medication   Multiple Pain Sites Yes   Pain Score 5  in last week, worst 10/10, best 0/10   Pain Type Phantom pain   Pain Orientation Left   Pain Descriptors / Indicators Stabbing;Throbbing   Pain Frequency Other (Comment)  intermittent, phantom pain worse in last [redacted] weeks along with gout hand pain            [facility] PT Assessment - 05/18/14 0800    Assessment   Medical Diagnosis left Transfemoral Amputation   Onset Date 05/09/14   Precautions   Precautions Fall   Restrictions   Weight Bearing Restrictions No   Balance Screen   Has the patient fallen in the past 6 months No   Has the patient had a decrease in activity level because of a fear of falling?  No   Is the patient reluctant to leave their home because of a fear of falling?  No   Home Environment   Living Enviornment Private residence   Living Arrangements Alone   Type of Home Other(Comment)  [place] entrance  short to clear 2-3" threshold   Home Layout One level   [place] - 2 wheels;Crutches;Cane - single point;Shower seat;Grab bars - tub/shower;Wheelchair - Press photographer   Prior Function   Level of Independence Independent with basic ADLs;Independent with homemaking with ambulation;Independent with gait;Independent with transfers  used RW & prosthesis to enter/exit home & limited community,   Cognition   Overall Cognitive Status Within Functional Limits for tasks assessed   Observation/Other Assessments   Focus on Therapeutic Outcomes (FOTO)  40  functional status   Fear Avoidance Belief Questionnaire (FABQ)  44(12)   PROM   Left Hip Extension -40   Strength   Overall Strength Deficits   Overall Strength Comments core stability poor   Left Hip Flexion 4/5   Left Hip Extension 3+/5   Left Hip ABduction 3+/5   Transfers   Sit to Stand 6: Modified independent (Device/Increase time);From  elevated  surface;With upper extremity assist;With armrests;From chair/3-in-1  20" high w/c, requires walker to stabilize   Stand to Sit 6: Modified independent (Device/Increase time);With upper extremity assist;With armrests;To elevated surface;To chair/3-in-1  from walker to 20" high chair   Ambulation/Gait   Ambulation/Gait Yes   Ambulation/Gait Assistance 5: Supervision   Ambulation/Gait Assistance Details HR 97, SpO2 95%, Resp 18, Dyspnea 4/4 with SOB   Ambulation Distance (Feet) 60 Feet   Assistive device Rolling walker;Prosthesis   Gait Pattern Step-to pattern;Trunk flexed;Abducted - left;Poor foot clearance - left;Trunk rotated posteriorly on left;Decreased trunk rotation;Lateral hip instability;Left hip hike;Left circumduction;Decreased weight shift to left;Decreased hip/knee flexion - left;Decreased stance time - left;Decreased step length - right;Decreased stride length   Ambulation Surface Level;Indoor   Gait velocity 1.26 ft/sec   Berg Balance Test   Sit to Stand Needs minimal aid to stand or to stabilize  requires walker to stabilize   Standing Unsupported Unable to stand 30 seconds unassisted  needs walker or counter to stabilize, 8 sec without UE assis   Sitting with Back Unsupported but Feet Supported on Floor or Stool Able to sit safely and securely 2 minutes   Stand to Sit Sits independently, has uncontrolled descent  walker to chair with armrests   Transfers Able to transfer safely, definite need of hands   Standing Unsupported with Eyes Closed Needs help to keep from falling  stands 10 sec with bil. UE assist only   Standing Ubsupported with Feet Together Needs help to attain position and unable to hold for 15 seconds  with walker stands with feet together for 30 sec   From Standing, Reach Forward with Outstretched Arm Loses balance while trying/requires external support  with walker reaches 5"   From Standing Position, Pick up Object from Floor Unable to try/needs assist to  keep balance  with walker reaches to 2" from floor   From Standing Position, Turn to Look Behind Over each Shoulder Needs assist to keep from losing balance and falling  with walker rotates head & shoulders only   Turn 360 Degrees Needs assistance while turning  With walker requires close supervision   Standing Unsupported, Alternately Place Feet on Step/Stool Needs assistance to keep from falling or unable to try  with walker requires min A   Standing Unsupported, One Foot in Front Loses balance while stepping or standing  with walker takes small step & holds 30 sec   Standing on One Leg Unable to try or needs assist to prevent fall  with walker stands on RLE for 8sec & prosthesis for 1 sec   Total Score 9   Timed Up and Go Test   Normal TUG (seconds) 27.97  walker with instability in turning around         Prosthetics Assessment - 05/18/14 0800    Prosthetics   Prosthetic Care Independent with Skin check;Prosthetic cleaning;Ply sock cleaning   Prosthetic Care Dependent with Correct ply sock adjustment;Proper wear schedule/adjustment  donning with new suspension of seal-in liner   Prosthetic Care Comments  arrived with limb ~2" out of socket   Donning prosthesis  Supervision   Doffing prosthesis  Modified independent (Device/Increase time)   Current prosthetic wear tolerance (days/week)  7 days /wk wore daily prior & since socket revision   Current prosthetic wear tolerance (#hours/day)  7 hours most days, 12 hrs yesterday working at [employer] site  7 hours is ~50% of awake hours   Current prosthetic weight-bearing tolerance (hours/day)  stands  5 minutes or gait 2 min. with no c/o pain but fatigue   Residual limb condition  normal   K code/activity level with prosthetic use  2                          PT Education - 05/18/14 0845    Education provided Yes   Education Details need for ongoing exercise outside of PT while under care and even after discharge    Person(s) Educated Patient   Methods Explanation   Comprehension Verbalized understanding          PT Short Term Goals - 05/18/14 0845    PT SHORT TERM GOAL #1   Title donnes prosthesis with new suspension correctly independently. (Target Date: 06/17/14)   Time 4   Period Weeks   Status New   PT SHORT TERM GOAL #2   Title demonstrates understanding of initial HEP. (Target Date: 06/17/14)   Time 4   Period Weeks   Status New   PT SHORT TERM GOAL #3   Title wears prosthesis >70% of awake hours >/= 5 days /wk. (Target Date: 06/17/14)   Time 4   Period Weeks   Status New   PT SHORT TERM GOAL #4   Title ambulates 100' with rolling walker & prosthesis with cues only for deviations. (Target Date: 06/17/14)   Time 4   Period Weeks   Status New   PT SHORT TERM GOAL #5   Title stands 30sec without UE support with supervision. (Target Date: 06/17/14)   Time 4   Period Weeks   Status New           PT Long Term Goals - 05/18/14 0845    PT LONG TERM GOAL #1   Title wears prosthesis >80% of awake hours daily. (Target Date: 07/15/14)   Time 8   Period Weeks   Status New   PT LONG TERM GOAL #2   Title verbalize understanding of ongoing fitness plan. (Target Date: 07/15/14)   Time 8   Period Weeks   Status New   PT LONG TERM GOAL #3   Title ambulates 200' with rolling walker & prosthesis modified independent. (Target Date: 07/15/14)   Time 8   Period Weeks   Status New   PT LONG TERM GOAL #4   Title Berg Balance >18/56 (Target Date: 07/15/14)   Time 8   Period Weeks   Status New   PT LONG TERM GOAL #5   Title negotiate ramp, curb & stairs (1 rail) with rolling walker & prosthesis modified independent. (Target Date: 07/15/14)   Time 8   Period Weeks   Status New               Plan - 05/18/14 0845    Clinical Impression Statement This 80yo male underwent a Left Transfemoral Amputation 02/04/13. He underwent  PT for prosthetic training in 2015 and was discharged  ambulating limited community distances with walker & prosthesis. Deficits include hip extension ROM -40, hip & trunk weakness, balance with Merrilee Jansky 9/56, Timed Up-Go 27.97sec with rolling walker and impaired gait with deviations limiting prosthesis advancement, wt acceptance at initial contact, wt shift onto prosthesis and single limb stability in stance. Gait velocity is 1.26 ft/sec. He has cardiopulmonary issues with only gait of 60'. He has pain in hands and phantom toe from gout. He is dependent in donning prosthesis with new suspension.  Pt will benefit from skilled therapeutic intervention in order to improve on the following deficits Abnormal gait;Cardiopulmonary status limiting activity;Decreased activity tolerance;Decreased balance;Decreased endurance;Decreased knowledge of use of DME;Decreased mobility;Decreased range of motion;Decreased strength;Pain;Other (comment)  prosthetic dependency   Rehab Potential Good   PT Frequency 2x / week   PT Duration 8 weeks   PT Treatment/Interventions ADLs/Self Care Home Management;DME Instruction;Gait training;Stair training;Functional mobility training;Therapeutic activities;Therapeutic exercise;Balance training;Neuromuscular re-education;Patient/family education;Other (comment)  prosthetic training   PT Next Visit Plan HEP for hip extension ROM, strength including core, endurance   Recommended Other Services [employer] for exercise   Consulted and Agree with Plan of Care Patient          G-Codes - 05-21-2014 0845    Functional Assessment Tool Used TImed Up & Go with rolling walker & prosthesis 27.97sec   Functional Limitation Mobility: Walking and moving around   Mobility: Walking and Moving Around Current Status [id number]) At least 80 percent but less than 100 percent impaired, limited or restricted   Mobility: Walking and Moving Around Goal Status [phone]) At least 60 percent but less than 80 percent impaired, limited or restricted        Problem List Patient Active Problem List   Diagnosis Date Noted  . OAB (overactive bladder) 02/07/2014  . Wound disruption, post-op, skin 05/28/2013  . Encounter for therapeutic drug monitoring 04/22/2013  . Wound infection 04/16/2013  . Chronic infection of amputation stump 04/13/2013  . Phantom limb pain 03/24/2013  . Wound drainage 03/23/2013  . Encounter for staple removal 03/09/2013  . Acute blood loss anemia 02/10/2013  . Chronic kidney disease 02/10/2013  . Unilateral AKA 02/09/2013  . Nontraumatic ischemic infarction of muscle of lower leg 02/04/2013  . Long term (current) use of anticoagulants 02/01/2013  . Pain, limb, left-Leg 01/21/2013  . Aftercare following surgery of the circulatory system, [place] 01/12/2013  . PVD (peripheral vascular disease) 12/22/2012  . Swelling of limb 10/07/2012  . Subclavian artery stenosis 09/08/2012  . Peripheral vascular disease, unspecified 08/18/2012  . Fever 07/08/2012  . Leg edema 06/02/2012  . Cardiomyopathy, ischemic 05/18/2012  . S/P ICD (internal cardiac defibrillator) procedure 05/18/2012  . Diabetes 05/18/2012  . Pain in limb 02/07/2012  . Atherosclerosis of native arteries of the extremities with intermittent claudication 12/17/2011  . Atherosclerotic PVD with intermittent claudication 10/15/2011  . Type I (juvenile type) diabetes mellitus with peripheral circulatory disorders, uncontrolled 09/06/2011  . PAD (peripheral artery disease) 09/04/2011  . CAD (coronary artery disease)   . Burning sensation of feet 05/10/2011  . Bursitis of right hip 05/10/2011  . Cervicalgia 05/10/2011  . Balance disorder 05/10/2011  . Neurogenic claudication 05/10/2011  . Lumbago 05/10/2011  . Chronic systolic congestive heart failure 07/06/2010  . ECZEMA 01/17/2010  . RENAL INSUFFICIENCY 11/01/2009  . UTI 11/01/2009  . CELLULITIS, LEG, RIGHT 08/22/2009  . LYMPHADENOPATHY, REACTIVE 08/22/2009  . PERIPHERAL NEUROPATHY 01/24/2009  .  DIZZINESS 12/29/2008  . ABDOMINAL PAIN, GENERALIZED 08/22/2008  . Gout 09/18/2007  . Coronary atherosclerosis 09/18/2007  . HYPERLIPIDEMIA 01/23/2007  . Essential hypertension 01/23/2007  . Atrial fibrillation 01/23/2007  . BENIGN PROSTATIC HYPERTROPHY 01/23/2007  . LATERAL EPICONDYLITIS 01/23/2007    Carlynn Purl, DPT Physical Therapist Specializing in Prosthetics 05/21/2014, 9:34 PM  [place] [address] [place], [place], [address] Phone: [phone]   Fax:  [phone]

## 2014-05-19 ENCOUNTER — Telehealth: Payer: Self-pay | Admitting: Family Medicine

## 2014-05-19 NOTE — Telephone Encounter (Signed)
Pt needs refills sent [facility] mailorder. Pt needs losartan 100 mg and his gout med #90 w/refills, I do not see gout med on list

## 2014-05-19 NOTE — Telephone Encounter (Signed)
Allopurinol 100 mg take 1 qd prn was added back to current medication list.

## 2014-05-20 ENCOUNTER — Telehealth: Payer: Self-pay | Admitting: *Deleted

## 2014-05-20 DIAGNOSIS — M79605 Pain in left leg: Secondary | ICD-10-CM

## 2014-05-20 DIAGNOSIS — S78112A Complete traumatic amputation at level between left hip and knee, initial encounter: Secondary | ICD-10-CM

## 2014-05-20 DIAGNOSIS — M79672 Pain in left foot: Secondary | ICD-10-CM

## 2014-05-20 DIAGNOSIS — G546 Phantom limb syndrome with pain: Secondary | ICD-10-CM

## 2014-05-20 NOTE — Telephone Encounter (Signed)
He may have a "bridge".  We'll discuss usage at next visit

## 2014-05-20 NOTE — Telephone Encounter (Signed)
Calling for a bridge on his medication to his appt with Naaman Plummer 06/03/14.  (Of note he had an appt 05/17/14 scheduled and he cancelled) Please advise

## 2014-05-23 ENCOUNTER — Ambulatory Visit (INDEPENDENT_AMBULATORY_CARE_PROVIDER_SITE_OTHER): Payer: PRIVATE HEALTH INSURANCE | Admitting: General Practice

## 2014-05-23 ENCOUNTER — Encounter: Payer: Self-pay | Admitting: Physical Therapy

## 2014-05-23 ENCOUNTER — Ambulatory Visit: Payer: Commercial Managed Care - HMO | Admitting: Physical Therapy

## 2014-05-23 DIAGNOSIS — R269 Unspecified abnormalities of gait and mobility: Secondary | ICD-10-CM | POA: Diagnosis not present

## 2014-05-23 DIAGNOSIS — Z7409 Other reduced mobility: Secondary | ICD-10-CM

## 2014-05-23 DIAGNOSIS — I4891 Unspecified atrial fibrillation: Secondary | ICD-10-CM

## 2014-05-23 DIAGNOSIS — Z89612 Acquired absence of left leg above knee: Secondary | ICD-10-CM

## 2014-05-23 DIAGNOSIS — M24652 Ankylosis, left hip: Secondary | ICD-10-CM

## 2014-05-23 DIAGNOSIS — Z5181 Encounter for therapeutic drug level monitoring: Secondary | ICD-10-CM

## 2014-05-23 LAB — POCT INR: INR: 3.2

## 2014-05-23 MED ORDER — ALLOPURINOL 100 MG PO TABS: 100.0000 mg | ORAL_TABLET | Freq: Every day | ORAL | Status: DC | PRN

## 2014-05-23 MED ORDER — OXYCODONE-ACETAMINOPHEN 7.5-325 MG PO TABS
1.0000 | ORAL_TABLET | Freq: Four times a day (QID) | ORAL | Status: DC | PRN
Start: 2014-05-23 — End: 2014-06-03

## 2014-05-23 MED ORDER — LOSARTAN POTASSIUM 100 MG PO TABS: 100.0000 mg | ORAL_TABLET | Freq: Every day | ORAL | Status: DC

## 2014-05-23 NOTE — Telephone Encounter (Signed)
I sent both scripts e-scribe. 

## 2014-05-23 NOTE — Telephone Encounter (Signed)
[name] called back and I am writing for #40 pills to cover him until his 06/03/14 appt.

## 2014-05-23 NOTE — Telephone Encounter (Signed)
Left message to call our office to verify usage.  Note on 05/18/14 says reported not using.  Will discuss bridge.

## 2014-05-23 NOTE — Patient Instructions (Addendum)
Stretching: Hip Flexor    Position wheelchair ~1 foot in front of counter or table. Lift right armrests. Turn to right side so right buttocks is on chair and left is off chair. Prosthesis / left leg is behind you in kneeling on left knee, slowly push pelvis down while slightly arching back until stretch is felt on front of hip. Trunk should be up straight. Hold _30___ seconds. Repeat __5-10__ times per set. Do ___2_ sets per session. Do __7__ sessions per day.  http://orth.exer.us/648   Copyright  VHI. All rights reserved.  [employer]   Stand with walker in front & buttocks against counter or door frame. Feet are under hips. Stand tall as possible, feet together in parallel, push head upward, eyes level. Hold for __3-5__ breaths. Repeat 10 times.  Copyright  VHI. All rights reserved.  Hip Flexors (Supine)   Sit at [facility] with both legs bent over edge of firm surface. Lie back bringing both knees to chest.  Start each time with both knees to chest which keeps from arching back. Let left leg down to stretch left hip. Relax for 5 seconds. Then tighten buttocks muscle for 5 seconds to push left foot towards the floor.  Do not let right knee come down to change position. Hold _5-10___ seconds. Repeat _10___ times. Do __1-2__ sessions per day. CAUTION: Stretch should be gentle, steady and slow.  Copyright  VHI. All rights reserved.  Pelvic Tilt: Posterior - Legs Bent (Supine)   Tighten stomach and flatten back by rolling pelvis down. Hold __5__ seconds. Relax. Repeat _10___ times per set. Do __4__ sets ( see second sheet for leg movements to increase difficulty.) per session. Do _1___ sessions per day.  http://orth.exer.us/202   Copyright  VHI. All rights reserved.  EXTENSION: Side-Lying - Knee Flexed (Active)   Lie on right side. Draw top, bent leg backward as far as possible. Hold 5 seconds Complete __2_ sets of _10__ repetitions. Perform _1-2__ sessions per day.  Repeat  with downward push. Think of clock with hip at middle of clock and head is at 12:00. Push limb down towards 5:00. Hold 5 seconds.  Copyright  VHI. All rights reserved.

## 2014-05-23 NOTE — Progress Notes (Signed)
Pre visit review using our clinic review tool, if applicable. No additional management support is needed unless otherwise documented below in the visit note. 

## 2014-05-23 NOTE — Therapy (Signed)
[facility] [address] [place], [place], [address] Phone: [phone]   Fax:  [phone]  Physical Therapy Treatment  Patient Details  Name: [name] MRN: [id number] Date of Birth: 14-Apr-1934 Referring Provider:  Laurey Morale, MD  Encounter Date: 05/23/2014      PT End of Session - 05/23/14 0930    Visit Number 2   Number of Visits 17   Date for PT Re-Evaluation 07/15/04   PT Start Time 0845   PT Stop Time 0930   PT Time Calculation (min) 45 min   Equipment Utilized During Treatment Gait belt   Activity Tolerance Patient limited by fatigue   Behavior During Therapy [facility] for tasks assessed/performed      Past Medical History  Diagnosis Date  . Gout   . Benign prostatic hypertrophy     takes Proscar daily  . Atrial fibrillation     takes Warfarin daily  . Chronic systolic dysfunction of left ventricle     a. mixed ischemic and nonischemic CM,  EF 35%. b. s/p AICD implantation.  . ED (erectile dysfunction)   . Arthritis   . Pacemaker     medtronic  . CAD (coronary artery disease)     a. s/p mid LAD stenting with DES 2008 Dr. Daneen Schick  . ICD (implantable cardiac defibrillator) in place     medtronic, Dr. Rayann Heman  . ICD (implantable cardiac defibrillator) in place   . Sleep apnea     hx of "had surgery for"  . Automatic implantable cardioverter-defibrillator in situ   . Depression   . Numbness and tingling     Hx; 22f left foot  . PAD (peripheral artery disease)     Severe by PV angiogram 09/2011  . Renal artery stenosis     s/p stenting 2009  . PAD (peripheral artery disease)     s/p multiple LLE bypass grafts; left mid-distal SCA occlusion by 08/2012 duplex  . Hypertension     takes Carvedilol and Losartan daily  . Constipation     takes Miralax daily as needed and Colace daily   . Type II diabetes mellitus     takes Novolog 70/30  . CHF (congestive heart failure)     takes Lasix  daily  . History of MRSA infection   . Insomnia     TAKES TRAZODONE NIGHTLY    Past Surgical History  Procedure Laterality Date  . Turp vaporization    . Cardiac defibrillator placement  12/26/09    pacemaker combo  . Cervical epidural injection  2013  . Femoral-tibial bypass graft  09/25/2011    Procedure: BYPASS GRAFT FEMORAL-TIBIAL ARTERY;  Surgeon: Mal Misty, MD;  Location: [facility] OR;  Service: Vascular;  Laterality: Left;  Left Femoral - Anterior Tibial Bypass;  saphenous vein graft from left leg  . Intraoperative arteriogram  09/25/2011    Procedure: INTRA OPERATIVE ARTERIOGRAM;  Surgeon: Mal Misty, MD;  Location: [place];  Service: Vascular;  Laterality: Left;  . Femoral-tibial bypass graft  02/07/2012    Procedure: BYPASS GRAFT FEMORAL-TIBIAL ARTERY;  Surgeon: Rosetta Posner, MD;  Location: [place];  Service: Vascular;  Laterality: Left;  Thrombectomy Left Femoral - Tibial Bypass Graft  . Embolectomy  02/07/2012    Procedure: EMBOLECTOMY;  Surgeon: Rosetta Posner, MD;  Location: [place];  Service: Vascular;  Laterality: Left;  . Femoral-tibial bypass graft  04/03/2012    Procedure: BYPASS GRAFT FEMORAL-TIBIAL ARTERY;  Surgeon: Mal Misty, MD;  Location: [place];  Service: Vascular;  Laterality: Left;  Redo  . Insert / replace / remove pacemaker  2007  . Coronary angioplasty with stent placement  ~ 2000  . Coronary angioplasty    . Uvulopalatopharyngoplasty (uppp)/tonsillectomy/septoplasty  06/30/2003    Archie Endo 06/30/2003 (07/10/2012)  . Shoulder open rotator cuff repair Right 2001    repair of lacerated right/notes 10/11/1999  (07/10/2012)  . Foot surgery Right 03/20/2001    "have plates and screws in; didn't break it" (07/10/2012)  . Carpal tunnel release Right 2002    Archie Endo 03/20/2001 (07/10/2012)  . Biceps tendon repair Right 2001    Archie Endo 03/20/2001 (07/10/2012)  . Renal artery stent  2009  . Femoral-popliteal bypass graft Left 09/16/2012    Procedure: LEFT FEMORAL-POPLITEAL BYPASS GRAFT  WITH GORTEX Propaten Graft 6x80 Thin Wall and Left lower leg Angiogram;  Surgeon: Mal Misty, MD;  Location: [place];  Service: Vascular;  Laterality: Left;  . Colonoscopy      Hx; of  . Tonsillectomy    . Adenoidectomy      Hx; of   . Femoral-tibial bypass graft Left 09/30/2012    Procedure: REDO LEFT FEMORAL-ANTERIOR TIBIAL ARTERY BYPASS USING COMPOSITE CEPHALIC AND BASILIC VEIN GRAFT FROM LEFT ARM;  Surgeon: Mal Misty, MD;  Location: [place];  Service: Vascular;  Laterality: Left;  . I&d extremity Left 10/21/2012    Procedure: EXPLORATION AND DEBRIDEMENT OF LEFT GROIN WOUND;  Surgeon: Mal Misty, MD;  Location: [place];  Service: Vascular;  Laterality: Left;  . Patch angioplasty Left 10/21/2012    Procedure: PATCH ANGIOPLASTY;  Surgeon: Mal Misty, MD;  Location: [place];  Service: Vascular;  Laterality: Left;  . Groin debridement Left 11/12/2012    Procedure: CLOSURE INGUINAL WOUND;  Surgeon: Mal Misty, MD;  Location: [place];  Service: Vascular;  Laterality: Left;  . Embolectomy Left 12/16/2012    Procedure: THROMBECTOMY  LEFT LEG BYPASS;  Surgeon: Elam Dutch, MD;  Location: [place];  Service: Vascular;  Laterality: Left;  . Leg amputation above knee Left 02/04/2013    DR LAWSON  . Amputation Left 02/04/2013    Procedure: AMPUTATION ABOVE KNEE-LEFT;  Surgeon: Mal Misty, MD;  Location: [place];  Service: Vascular;  Laterality: Left;  . Removal of graft Left 04/16/2013    Procedure: I & D LEFT AKA WOUND, POSSIBLE REMOVAL OF INFECTED GORTEX GRAFT;  Surgeon: Mal Misty, MD;  Location: [place];  Service: Vascular;  Laterality: Left;  . Abdominal aortagram N/A 09/11/2011    Procedure: ABDOMINAL Maxcine Ham;  Surgeon: Wellington Hampshire, MD;  Location: [facility] CATH LAB;  Service: Cardiovascular;  Laterality: N/A;  . Lower extremity angiogram Left 12/25/2011    Procedure: LOWER EXTREMITY ANGIOGRAM;  Surgeon: Serafina Mitchell, MD;  Location: [facility] CATH LAB;  Service: Cardiovascular;   Laterality: Left;  lt leg angio co2  . Abdominal angiogram  12/25/2011    Procedure: ABDOMINAL ANGIOGRAM;  Surgeon: Serafina Mitchell, MD;  Location: [facility] CATH LAB;  Service: Cardiovascular;;  . Lower extremity angiogram N/A 07/07/2012    Procedure: LOWER EXTREMITY ANGIOGRAM;  Surgeon: Conrad La Crosse, MD;  Location: [facility] CATH LAB;  Service: Cardiovascular;  Laterality: N/A;  . Lower extremity angiogram N/A 09/15/2012    Procedure: LOWER EXTREMITY ANGIOGRAM;  Surgeon: Serafina Mitchell, MD;  Location: [facility] CATH LAB;  Service: Cardiovascular;  Laterality: N/A;  . Upper extremity angiogram  09/15/2012  Procedure: UPPER EXTREMITY ANGIOGRAM;  Surgeon: Serafina Mitchell, MD;  Location: [facility] CATH LAB;  Service: Cardiovascular;;  . Biv icd genertaor change out N/A 11/09/2013    Procedure: BIV ICD GENERTAOR CHANGE OUT;  Surgeon: Coralyn Mark, MD;  Location: [facility] CATH LAB;  Service: Cardiovascular;  Laterality: N/A;    There were no vitals filed for this visit.  Visit Diagnosis:  Abnormality of gait  Impaired functional mobility and activity tolerance  Decreased range of motion of hip, left  Status post above knee amputation of left lower extremity      Subjective Assessment - 05/23/14 0852    Symptoms Went to [facility] 2x since evaluation. Did recumbent ellliptical 11 min first visit and 15 min 2nd visit.   Currently in Pain? Yes   Pain Score 5                         Therapeutic Exercise: Seated modified half-kneel hip flexor stretch, standing posture against counter with walker, supine Thomas hip flexor stretch, sidelying hip extension & hip extension with pelvic depression. Core stabilization initiated with supine posterior pelvic tilt (PPT), PPT with hip abd/add, PPT with right leg slide hip ext/ flex, PPT with alt. Hip flexion. Prone positioning with in extension using Yoga block or folded towel under limb with head turned to right to increase stretch.        PT Education - 05/23/14 0845     Education provided Yes   Education Details see patient instructions for stretches in sitting, standing at counter and supine /sidelying.   Person(s) Educated Patient   Methods Explanation;Demonstration;Handout   Comprehension Verbalized understanding;Returned demonstration;Need further instruction          PT Short Term Goals - 05/18/14 0845    PT SHORT TERM GOAL #1   Title donnes prosthesis with new suspension correctly independently. (Target Date: 06/17/14)   Time 4   Period Weeks   Status New   PT SHORT TERM GOAL #2   Title demonstrates understanding of initial HEP. (Target Date: 06/17/14)   Time 4   Period Weeks   Status New   PT SHORT TERM GOAL #3   Title wears prosthesis >70% of awake hours >/= 5 days /wk. (Target Date: 06/17/14)   Time 4   Period Weeks   Status New   PT SHORT TERM GOAL #4   Title ambulates 100' with rolling walker & prosthesis with cues only for deviations. (Target Date: 06/17/14)   Time 4   Period Weeks   Status New   PT SHORT TERM GOAL #5   Title stands 30sec without UE support with supervision. (Target Date: 06/17/14)   Time 4   Period Weeks   Status New           PT Long Term Goals - 05/18/14 0845    PT LONG TERM GOAL #1   Title wears prosthesis >80% of awake hours daily. (Target Date: 07/15/14)   Time 8   Period Weeks   Status New   PT LONG TERM GOAL #2   Title verbalize understanding of ongoing fitness plan. (Target Date: 07/15/14)   Time 8   Period Weeks   Status New   PT LONG TERM GOAL #3   Title ambulates 200' with rolling walker & prosthesis modified independent. (Target Date: 07/15/14)   Time 8   Period Weeks   Status New   PT LONG TERM GOAL #4   Title Merrilee Jansky  Balance >18/56 (Target Date: 07/15/14)   Time 8   Period Weeks   Status New   PT LONG TERM GOAL #5   Title negotiate ramp, curb & stairs (1 rail) with rolling walker & prosthesis modified independent. (Target Date: 07/15/14)   Time 8   Period Weeks   Status New                Plan - 05/23/14 0930    Clinical Impression Statement Patient seems to understand stretches in various positions.    Pt will benefit from skilled therapeutic intervention in order to improve on the following deficits Abnormal gait;Cardiopulmonary status limiting activity;Decreased activity tolerance;Decreased balance;Decreased endurance;Decreased knowledge of use of DME;Decreased mobility;Decreased range of motion;Decreased strength;Pain;Other (comment)  prosthetic dependency   Rehab Potential Good   PT Frequency 2x / week   PT Duration 8 weeks   PT Treatment/Interventions ADLs/Self Care Home Management;DME Instruction;Gait training;Stair training;Functional mobility training;Therapeutic activities;Therapeutic exercise;Balance training;Neuromuscular re-education;Patient/family education;Other (comment)  prosthetic training   PT Next Visit Plan review HEP for hip extension ROM, strength including core, endurance, Prosthetic training with rolling walker   Consulted and Agree with Plan of Care Patient        Problem List Patient Active Problem List   Diagnosis Date Noted  . OAB (overactive bladder) 02/07/2014  . Wound disruption, post-op, skin 05/28/2013  . Encounter for therapeutic drug monitoring 04/22/2013  . Wound infection 04/16/2013  . Chronic infection of amputation stump 04/13/2013  . Phantom limb pain 03/24/2013  . Wound drainage 03/23/2013  . Encounter for staple removal 03/09/2013  . Acute blood loss anemia 02/10/2013  . Chronic kidney disease 02/10/2013  . Unilateral AKA 02/09/2013  . Nontraumatic ischemic infarction of muscle of lower leg 02/04/2013  . Long term (current) use of anticoagulants 02/01/2013  . Pain, limb, left-Leg 01/21/2013  . Aftercare following surgery of the circulatory system, [place] 01/12/2013  . PVD (peripheral vascular disease) 12/22/2012  . Swelling of limb 10/07/2012  . Subclavian artery stenosis 09/08/2012  . Peripheral  vascular disease, unspecified 08/18/2012  . Fever 07/08/2012  . Leg edema 06/02/2012  . Cardiomyopathy, ischemic 05/18/2012  . S/P ICD (internal cardiac defibrillator) procedure 05/18/2012  . Diabetes 05/18/2012  . Pain in limb 02/07/2012  . Atherosclerosis of native arteries of the extremities with intermittent claudication 12/17/2011  . Atherosclerotic PVD with intermittent claudication 10/15/2011  . Type I (juvenile type) diabetes mellitus with peripheral circulatory disorders, uncontrolled 09/06/2011  . PAD (peripheral artery disease) 09/04/2011  . CAD (coronary artery disease)   . Burning sensation of feet 05/10/2011  . Bursitis of right hip 05/10/2011  . Cervicalgia 05/10/2011  . Balance disorder 05/10/2011  . Neurogenic claudication 05/10/2011  . Lumbago 05/10/2011  . Chronic systolic congestive heart failure 07/06/2010  . ECZEMA 01/17/2010  . RENAL INSUFFICIENCY 11/01/2009  . UTI 11/01/2009  . CELLULITIS, LEG, RIGHT 08/22/2009  . LYMPHADENOPATHY, REACTIVE 08/22/2009  . PERIPHERAL NEUROPATHY 01/24/2009  . DIZZINESS 12/29/2008  . ABDOMINAL PAIN, GENERALIZED 08/22/2008  . Gout 09/18/2007  . Coronary atherosclerosis 09/18/2007  . HYPERLIPIDEMIA 01/23/2007  . Essential hypertension 01/23/2007  . Atrial fibrillation 01/23/2007  . BENIGN PROSTATIC HYPERTROPHY 01/23/2007  . LATERAL EPICONDYLITIS 01/23/2007    Tilak Oakley PT, DPT 05/23/2014, 12:11 PM  [facility] [address] [place], [place], [address] Phone: [phone]   Fax:  [phone]

## 2014-05-24 ENCOUNTER — Ambulatory Visit (INDEPENDENT_AMBULATORY_CARE_PROVIDER_SITE_OTHER): Payer: Commercial Managed Care - HMO | Admitting: Interventional Cardiology

## 2014-05-24 ENCOUNTER — Encounter: Payer: Self-pay | Admitting: Interventional Cardiology

## 2014-05-24 VITALS — BP 140/82 | HR 71 | Ht 68.0 in | Wt 187.0 lb

## 2014-05-24 DIAGNOSIS — I5022 Chronic systolic (congestive) heart failure: Secondary | ICD-10-CM

## 2014-05-24 DIAGNOSIS — I739 Peripheral vascular disease, unspecified: Secondary | ICD-10-CM

## 2014-05-24 DIAGNOSIS — I482 Chronic atrial fibrillation, unspecified: Secondary | ICD-10-CM

## 2014-05-24 DIAGNOSIS — I25718 Atherosclerosis of autologous vein coronary artery bypass graft(s) with other forms of angina pectoris: Secondary | ICD-10-CM

## 2014-05-24 NOTE — Progress Notes (Signed)
Cardiology Office Note   Date:  05/24/2014   ID:  [name], DOB 08-12-1934, MRN [id number]  PCP:  Laurey Morale, MD  Cardiologist:   Sinclair Grooms, MD   No chief complaint on file.     History of Present Illness: [name] is a 80 y.o. male who presents for follow-up of coronary artery disease, systolic heart failure, atrial fibrillation, and chronic long-term anticoagulation therapy. He denies orthopnea and edema. He has not had angina. He denies nitroglycerin use. Appetite is been stable. He brought laboratory data from the [facility] hemoglobin A1c is 8.7. Creatinine is popped up to 2.32. We discussed his laboratory data and implications.    Past Medical History  Diagnosis Date  . Gout   . Benign prostatic hypertrophy     takes Proscar daily  . Atrial fibrillation     takes Warfarin daily  . Chronic systolic dysfunction of left ventricle     a. mixed ischemic and nonischemic CM,  EF 35%. b. s/p AICD implantation.  . ED (erectile dysfunction)   . Arthritis   . Pacemaker     medtronic  . CAD (coronary artery disease)     a. s/p mid LAD stenting with DES 2008 Dr. Daneen Schick  . ICD (implantable cardiac defibrillator) in place     medtronic, Dr. Rayann Heman  . ICD (implantable cardiac defibrillator) in place   . Sleep apnea     hx of "had surgery for"  . Automatic implantable cardioverter-defibrillator in situ   . Depression   . Numbness and tingling     Hx; 54f left foot  . PAD (peripheral artery disease)     Severe by PV angiogram 09/2011  . Renal artery stenosis     s/p stenting 2009  . PAD (peripheral artery disease)     s/p multiple LLE bypass grafts; left mid-distal SCA occlusion by 08/2012 duplex  . Hypertension     takes Carvedilol and Losartan daily  . Constipation     takes Miralax daily as needed and Colace daily   . Type II diabetes mellitus     takes Novolog 70/30  . CHF (congestive heart failure)     takes Lasix daily  .  History of MRSA infection   . Insomnia     TAKES TRAZODONE NIGHTLY    Past Surgical History  Procedure Laterality Date  . Turp vaporization    . Cardiac defibrillator placement  12/26/09    pacemaker combo  . Cervical epidural injection  2013  . Femoral-tibial bypass graft  09/25/2011    Procedure: BYPASS GRAFT FEMORAL-TIBIAL ARTERY;  Surgeon: Mal Misty, MD;  Location: [facility] OR;  Service: Vascular;  Laterality: Left;  Left Femoral - Anterior Tibial Bypass;  saphenous vein graft from left leg  . Intraoperative arteriogram  09/25/2011    Procedure: INTRA OPERATIVE ARTERIOGRAM;  Surgeon: Mal Misty, MD;  Location: [place];  Service: Vascular;  Laterality: Left;  . Femoral-tibial bypass graft  02/07/2012    Procedure: BYPASS GRAFT FEMORAL-TIBIAL ARTERY;  Surgeon: Rosetta Posner, MD;  Location: [place];  Service: Vascular;  Laterality: Left;  Thrombectomy Left Femoral - Tibial Bypass Graft  . Embolectomy  02/07/2012    Procedure: EMBOLECTOMY;  Surgeon: Rosetta Posner, MD;  Location: [place];  Service: Vascular;  Laterality: Left;  . Femoral-tibial bypass graft  04/03/2012    Procedure: BYPASS GRAFT FEMORAL-TIBIAL ARTERY;  Surgeon: Mal Misty, MD;  Location:  [facility] OR;  Service: Vascular;  Laterality: Left;  Redo  . Insert / replace / remove pacemaker  2007  . Coronary angioplasty with stent placement  ~ 2000  . Coronary angioplasty    . Uvulopalatopharyngoplasty (uppp)/tonsillectomy/septoplasty  06/30/2003    Archie Endo 06/30/2003 (07/10/2012)  . Shoulder open rotator cuff repair Right 2001    repair of lacerated right/notes 10/11/1999  (07/10/2012)  . Foot surgery Right 03/20/2001    "have plates and screws in; didn't break it" (07/10/2012)  . Carpal tunnel release Right 2002    Archie Endo 03/20/2001 (07/10/2012)  . Biceps tendon repair Right 2001    Archie Endo 03/20/2001 (07/10/2012)  . Renal artery stent  2009  . Femoral-popliteal bypass graft Left 09/16/2012    Procedure: LEFT FEMORAL-POPLITEAL BYPASS GRAFT WITH GORTEX  Propaten Graft 6x80 Thin Wall and Left lower leg Angiogram;  Surgeon: Mal Misty, MD;  Location: [place];  Service: Vascular;  Laterality: Left;  . Colonoscopy      Hx; of  . Tonsillectomy    . Adenoidectomy      Hx; of   . Femoral-tibial bypass graft Left 09/30/2012    Procedure: REDO LEFT FEMORAL-ANTERIOR TIBIAL ARTERY BYPASS USING COMPOSITE CEPHALIC AND BASILIC VEIN GRAFT FROM LEFT ARM;  Surgeon: Mal Misty, MD;  Location: [place];  Service: Vascular;  Laterality: Left;  . I&d extremity Left 10/21/2012    Procedure: EXPLORATION AND DEBRIDEMENT OF LEFT GROIN WOUND;  Surgeon: Mal Misty, MD;  Location: [place];  Service: Vascular;  Laterality: Left;  . Patch angioplasty Left 10/21/2012    Procedure: PATCH ANGIOPLASTY;  Surgeon: Mal Misty, MD;  Location: [place];  Service: Vascular;  Laterality: Left;  . Groin debridement Left 11/12/2012    Procedure: CLOSURE INGUINAL WOUND;  Surgeon: Mal Misty, MD;  Location: [place];  Service: Vascular;  Laterality: Left;  . Embolectomy Left 12/16/2012    Procedure: THROMBECTOMY  LEFT LEG BYPASS;  Surgeon: Elam Dutch, MD;  Location: [place];  Service: Vascular;  Laterality: Left;  . Leg amputation above knee Left 02/04/2013    DR LAWSON  . Amputation Left 02/04/2013    Procedure: AMPUTATION ABOVE KNEE-LEFT;  Surgeon: Mal Misty, MD;  Location: [place];  Service: Vascular;  Laterality: Left;  . Removal of graft Left 04/16/2013    Procedure: I & D LEFT AKA WOUND, POSSIBLE REMOVAL OF INFECTED GORTEX GRAFT;  Surgeon: Mal Misty, MD;  Location: [place];  Service: Vascular;  Laterality: Left;  . Abdominal aortagram N/A 09/11/2011    Procedure: ABDOMINAL Maxcine Ham;  Surgeon: Wellington Hampshire, MD;  Location: [facility] CATH LAB;  Service: Cardiovascular;  Laterality: N/A;  . Lower extremity angiogram Left 12/25/2011    Procedure: LOWER EXTREMITY ANGIOGRAM;  Surgeon: Serafina Mitchell, MD;  Location: [facility] CATH LAB;  Service: Cardiovascular;  Laterality: Left;  lt  leg angio co2  . Abdominal angiogram  12/25/2011    Procedure: ABDOMINAL ANGIOGRAM;  Surgeon: Serafina Mitchell, MD;  Location: [facility] CATH LAB;  Service: Cardiovascular;;  . Lower extremity angiogram N/A 07/07/2012    Procedure: LOWER EXTREMITY ANGIOGRAM;  Surgeon: Conrad Burien, MD;  Location: [facility] CATH LAB;  Service: Cardiovascular;  Laterality: N/A;  . Lower extremity angiogram N/A 09/15/2012    Procedure: LOWER EXTREMITY ANGIOGRAM;  Surgeon: Serafina Mitchell, MD;  Location: [facility] CATH LAB;  Service: Cardiovascular;  Laterality: N/A;  . Upper extremity angiogram  09/15/2012    Procedure: UPPER EXTREMITY ANGIOGRAM;  Surgeon: Serafina Mitchell, MD;  Location: [facility] CATH LAB;  Service: Cardiovascular;;  . Biv icd genertaor change out N/A 11/09/2013    Procedure: BIV ICD GENERTAOR CHANGE OUT;  Surgeon: Coralyn Mark, MD;  Location: [facility] CATH LAB;  Service: Cardiovascular;  Laterality: N/A;     Current Outpatient Prescriptions  Medication Sig Dispense Refill  . allopurinol (ZYLOPRIM) 100 MG tablet Take 1 tablet (100 mg total) by mouth daily as needed. 90 tablet 3  . amLODipine (NORVASC) 5 MG tablet Take 5 mg by mouth daily.    . carvedilol (COREG) 25 MG tablet Take 1 tablet (25 mg total) by mouth 2 (two) times daily with a meal. 180 tablet 3  . finasteride (PROSCAR) 5 MG tablet Take 1 tablet (5 mg total) by mouth daily. 90 tablet 3  . furosemide (LASIX) 40 MG tablet Take 1 tablet (40 mg total) by mouth daily. 90 tablet 3  . gabapentin (NEURONTIN) 300 MG capsule Take 1 capsule (300 mg total) by mouth 3 (three) times daily. 270 capsule 3  . glucose blood (ONE TOUCH ULTRA TEST) test strip use to test blood sugar two times daily as instructed by physician. 100 each 2  . insulin aspart protamine- aspart (NOVOLOG MIX 70/30) (70-30) 100 UNIT/ML injection Inject 7 Units into the skin 2 (two) times daily with a meal.    . Insulin Pen Needle 31G X 8 MM MISC Use 2-3 times per day and diagnosis code is 250.00 100 each 1  .  losartan (COZAAR) 100 MG tablet Take 1 tablet (100 mg total) by mouth daily. 90 tablet 3  . nitroGLYCERIN (NITROSTAT) 0.4 MG SL tablet Place 1 tablet (0.4 mg total) under the tongue every 5 (five) minutes as needed for chest pain. For chest pain, max 3 doses 90 tablet 3  . oxybutynin (DITROPAN-XL) 10 MG 24 hr tablet Take 1 tablet (10 mg total) by mouth at bedtime. 30 tablet 2  . oxyCODONE-acetaminophen (PERCOCET) 7.5-325 MG per tablet Take 1 tablet by mouth every 6 (six) hours as needed for pain. 40 tablet 0  . warfarin (COUMADIN) 5 MG tablet Take as directed by anticoagulation clinic 60 tablet 1   No current facility-administered medications for this visit.    Allergies:   Other and Bactrim ds    Social History:  The patient  reports that he has never smoked. He has never used smokeless tobacco. He reports that he drinks about 2.4 oz of alcohol per week. He reports that he does not use illicit drugs.   Family History:  The patient's family history includes Cancer in his father and another family member; Heart disease in an other family member. There is no history of Diabetes.    ROS:  Please see the history of present illness.   Otherwise, review of systems are positive for depression related to loss of mobility following amputation. Soreness in the stump of his amputated leg. He has a new prosthesis but is still unable to walk. This causes great depression..   All other systems are reviewed and negative.    PHYSICAL EXAM: VS:  BP 140/82 mmHg  Pulse 71  Ht 5\' 8"  (1.727 m)  Wt 187 lb (84.823 kg)  BMI 28.44 kg/m2  SpO2 99% , BMI Body mass index is 28.44 kg/(m^2). GEN: Well nourished, well developed, in no acute distress HEENT: normal Neck: no JVD, carotid bruits, or masses Cardiac: Gallop is audible. RRR; no murmurs, rubs,,no edema  Respiratory:  clear to auscultation  bilaterally, normal work of breathing GI: soft, nontender, nondistended, + BS MS: no deformity or atrophy and left  lower extremity above-the-knee petition. He has a new prosthesis that honors the [place] flag Skin: warm and dry, no rash Neuro:  Strength and sensation are intact Psych: euthymic mood, full affect   EKG:  EKG is not ordered today.    Recent Labs: 11/02/2013: Hemoglobin 15.3; Platelets 143.0* 02/28/2014: BUN 41*; Creatinine 2.0*; Potassium 4.2; Sodium 138    Lipid Panel    Component Value Date/Time   CHOL 267* 05/06/2012 1125   TRIG 158.0* 05/06/2012 1125   HDL 42.60 05/06/2012 1125   CHOLHDL 6 05/06/2012 1125   VLDL 31.6 05/06/2012 1125   LDLCALC 84 06/26/2011 1031   LDLDIRECT 200.5 05/06/2012 1125      Wt Readings from Last 3 Encounters:  05/24/14 187 lb (84.823 kg)  02/21/14 189 lb 3.2 oz (85.821 kg)  01/17/14 182 lb (82.555 kg)      Other studies Reviewed: Additional studies/ records that were reviewed today include: . Review of the above records demonstrates:    ASSESSMENT AND PLAN:  Atherosclerosis of autologous vein coronary artery bypass graft with other forms of angina pectoris: Asymptomatic  Chronic atrial fibrillation: Asymptomatic  Chronic systolic congestive heart failure: Euvolemic  Peripheral vascular disease: The left lower extremity AKA     Current medicines are reviewed at length with the patient today.  The patient does not have concerns regarding medicines.  The following changes have been made:  Encouraged to try hard in physical therapy so that he will be able to walk again  Labs/ tests ordered today include:  No orders of the defined types were placed in this encounter.     Disposition:   FU with Linard Millers, 8-12 months.   Signed, Sinclair Grooms, MD  05/24/2014 11:26 AM    [facility] [place], [place],   [address] Phone: [phone]; Fax: [phone]

## 2014-05-24 NOTE — Patient Instructions (Signed)
Your physician recommends that you continue on your current medications as directed. Please refer to the Current Medication list given to you today.  Monitor your sodium intake  We encourage physical activity  Your physician wants you to follow-up in: 9-12 months with Dr.Smith You will receive a reminder letter in the mail two months in advance. If you don't receive a letter, please call our office to schedule the follow-up appointment.

## 2014-05-25 ENCOUNTER — Ambulatory Visit: Payer: Commercial Managed Care - HMO | Admitting: Physical Therapy

## 2014-05-25 ENCOUNTER — Encounter: Payer: Self-pay | Admitting: Physical Therapy

## 2014-05-25 DIAGNOSIS — Z89612 Acquired absence of left leg above knee: Secondary | ICD-10-CM

## 2014-05-25 DIAGNOSIS — M24652 Ankylosis, left hip: Secondary | ICD-10-CM

## 2014-05-25 DIAGNOSIS — R269 Unspecified abnormalities of gait and mobility: Secondary | ICD-10-CM | POA: Diagnosis not present

## 2014-05-25 DIAGNOSIS — Z7409 Other reduced mobility: Secondary | ICD-10-CM

## 2014-05-25 NOTE — Therapy (Signed)
[name] [address] [place], [place], [address] Phone: [phone]   Fax:  [phone]  Physical Therapy Treatment  Patient Details  Name: [name] MRN: [id number] Date of Birth: [birth year] Referring Provider:  Laurey Morale, MD  Encounter Date: 05/25/2014      PT End of Session - 05/25/14 1003    Visit Number 3   Number of Visits 17   Date for PT Re-Evaluation 07/15/04   PT Start Time 0845   PT Stop Time 0925   PT Time Calculation (min) 40 min   Equipment Utilized During Treatment Gait belt   Activity Tolerance Patient limited by fatigue   Behavior During Therapy [facility] for tasks assessed/performed      Past Medical History  Diagnosis Date  . Gout   . Benign prostatic hypertrophy     takes Proscar daily  . Atrial fibrillation     takes Warfarin daily  . Chronic systolic dysfunction of left ventricle     a. mixed ischemic and nonischemic CM,  EF 35%. b. s/p AICD implantation.  . ED (erectile dysfunction)   . Arthritis   . Pacemaker     medtronic  . CAD (coronary artery disease)     a. s/p mid LAD stenting with DES 2008 Dr. Daneen Schick  . ICD (implantable cardiac defibrillator) in place     medtronic, Dr. Rayann Heman  . ICD (implantable cardiac defibrillator) in place   . Sleep apnea     hx of "had surgery for"  . Automatic implantable cardioverter-defibrillator in situ   . Depression   . Numbness and tingling     Hx; 95f left foot  . PAD (peripheral artery disease)     Severe by PV angiogram 09/2011  . Renal artery stenosis     s/p stenting 2009  . PAD (peripheral artery disease)     s/p multiple LLE bypass grafts; left mid-distal SCA occlusion by 08/2012 duplex  . Hypertension     takes Carvedilol and Losartan daily  . Constipation     takes Miralax daily as needed and Colace daily   . Type II diabetes mellitus     takes Novolog 70/30  . CHF (congestive heart failure)     takes Lasix  daily  . History of MRSA infection   . Insomnia     TAKES TRAZODONE NIGHTLY    Past Surgical History  Procedure Laterality Date  . Turp vaporization    . Cardiac defibrillator placement  12/26/09    pacemaker combo  . Cervical epidural injection  2013  . Femoral-tibial bypass graft  09/25/2011    Procedure: BYPASS GRAFT FEMORAL-TIBIAL ARTERY;  Surgeon: Mal Misty, MD;  Location: [facility] OR;  Service: Vascular;  Laterality: Left;  Left Femoral - Anterior Tibial Bypass;  saphenous vein graft from left leg  . Intraoperative arteriogram  09/25/2011    Procedure: INTRA OPERATIVE ARTERIOGRAM;  Surgeon: Mal Misty, MD;  Location: [place];  Service: Vascular;  Laterality: Left;  . Femoral-tibial bypass graft  02/07/2012    Procedure: BYPASS GRAFT FEMORAL-TIBIAL ARTERY;  Surgeon: Rosetta Posner, MD;  Location: [place];  Service: Vascular;  Laterality: Left;  Thrombectomy Left Femoral - Tibial Bypass Graft  . Embolectomy  02/07/2012    Procedure: EMBOLECTOMY;  Surgeon: Rosetta Posner, MD;  Location: [place];  Service: Vascular;  Laterality: Left;  . Femoral-tibial bypass graft  04/03/2012    Procedure: BYPASS GRAFT FEMORAL-TIBIAL ARTERY;  Surgeon: Mal Misty, MD;  Location: [place];  Service: Vascular;  Laterality: Left;  Redo  . Insert / replace / remove pacemaker  2007  . Coronary angioplasty with stent placement  ~ 2000  . Coronary angioplasty    . Uvulopalatopharyngoplasty (uppp)/tonsillectomy/septoplasty  06/30/2003    Archie Endo 06/30/2003 (07/10/2012)  . Shoulder open rotator cuff repair Right 2001    repair of lacerated right/notes 10/11/1999  (07/10/2012)  . Foot surgery Right 03/20/2001    "have plates and screws in; didn't break it" (07/10/2012)  . Carpal tunnel release Right 2002    Archie Endo 03/20/2001 (07/10/2012)  . Biceps tendon repair Right 2001    Archie Endo 03/20/2001 (07/10/2012)  . Renal artery stent  2009  . Femoral-popliteal bypass graft Left 09/16/2012    Procedure: LEFT FEMORAL-POPLITEAL BYPASS GRAFT  WITH GORTEX Propaten Graft 6x80 Thin Wall and Left lower leg Angiogram;  Surgeon: Mal Misty, MD;  Location: [place];  Service: Vascular;  Laterality: Left;  . Colonoscopy      Hx; of  . Tonsillectomy    . Adenoidectomy      Hx; of   . Femoral-tibial bypass graft Left 09/30/2012    Procedure: REDO LEFT FEMORAL-ANTERIOR TIBIAL ARTERY BYPASS USING COMPOSITE CEPHALIC AND BASILIC VEIN GRAFT FROM LEFT ARM;  Surgeon: Mal Misty, MD;  Location: [place];  Service: Vascular;  Laterality: Left;  . I&d extremity Left 10/21/2012    Procedure: EXPLORATION AND DEBRIDEMENT OF LEFT GROIN WOUND;  Surgeon: Mal Misty, MD;  Location: [place];  Service: Vascular;  Laterality: Left;  . Patch angioplasty Left 10/21/2012    Procedure: PATCH ANGIOPLASTY;  Surgeon: Mal Misty, MD;  Location: [place];  Service: Vascular;  Laterality: Left;  . Groin debridement Left 11/12/2012    Procedure: CLOSURE INGUINAL WOUND;  Surgeon: Mal Misty, MD;  Location: [place];  Service: Vascular;  Laterality: Left;  . Embolectomy Left 12/16/2012    Procedure: THROMBECTOMY  LEFT LEG BYPASS;  Surgeon: Elam Dutch, MD;  Location: [place];  Service: Vascular;  Laterality: Left;  . Leg amputation above knee Left 02/04/2013    DR LAWSON  . Amputation Left 02/04/2013    Procedure: AMPUTATION ABOVE KNEE-LEFT;  Surgeon: Mal Misty, MD;  Location: [place];  Service: Vascular;  Laterality: Left;  . Removal of graft Left 04/16/2013    Procedure: I & D LEFT AKA WOUND, POSSIBLE REMOVAL OF INFECTED GORTEX GRAFT;  Surgeon: Mal Misty, MD;  Location: [place];  Service: Vascular;  Laterality: Left;  . Abdominal aortagram N/A 09/11/2011    Procedure: ABDOMINAL Maxcine Ham;  Surgeon: Wellington Hampshire, MD;  Location: [facility] CATH LAB;  Service: Cardiovascular;  Laterality: N/A;  . Lower extremity angiogram Left 12/25/2011    Procedure: LOWER EXTREMITY ANGIOGRAM;  Surgeon: Serafina Mitchell, MD;  Location: [facility] CATH LAB;  Service: Cardiovascular;   Laterality: Left;  lt leg angio co2  . Abdominal angiogram  12/25/2011    Procedure: ABDOMINAL ANGIOGRAM;  Surgeon: Serafina Mitchell, MD;  Location: [facility] CATH LAB;  Service: Cardiovascular;;  . Lower extremity angiogram N/A 07/07/2012    Procedure: LOWER EXTREMITY ANGIOGRAM;  Surgeon: Conrad La Crosse, MD;  Location: [facility] CATH LAB;  Service: Cardiovascular;  Laterality: N/A;  . Lower extremity angiogram N/A 09/15/2012    Procedure: LOWER EXTREMITY ANGIOGRAM;  Surgeon: Serafina Mitchell, MD;  Location: [facility] CATH LAB;  Service: Cardiovascular;  Laterality: N/A;  . Upper extremity angiogram  09/15/2012  Procedure: UPPER EXTREMITY ANGIOGRAM;  Surgeon: Serafina Mitchell, MD;  Location: [facility] CATH LAB;  Service: Cardiovascular;;  . Biv icd genertaor change out N/A 11/09/2013    Procedure: BIV ICD GENERTAOR CHANGE OUT;  Surgeon: Coralyn Mark, MD;  Location: [facility] CATH LAB;  Service: Cardiovascular;  Laterality: N/A;    There were no vitals filed for this visit.  Visit Diagnosis:  Impaired functional mobility and activity tolerance  Decreased range of motion of hip, left  Status post above knee amputation of left lower extremity      Subjective Assessment - 05/25/14 0852    Symptoms Went to [facility] yesterday and did total of 17 min on machine.   Currently in Pain? No/denies     Therapeutic Exercise: See patient education Neuromuscular Re-Ed: In parallel bars with BUE support, mirror for visual feedback and heavy tactile cues / manual assist at pelvis.  Sit to stand with cues on controlling prosthetic knee with manual cues.  Standing with bilateral stance - hip extension to lock knee isolating from pelvic retraction. Standing with prosthesis forward in step stance - hip extension to lock knee without pelvis. Standing with step stance, lock knee then wt shift pelvis over prosthesis keeping knee locked. Standing with right foot on 2" block to aid clearance for isolated hip extension on left.                            PT Education - 05/25/14 0945    Education provided Yes   Education Details reviewed HEP issued last session. Recumbent elliptical at [facility] start with 6 min work at low level, smooth steady pace, 4 min rest, for 3 sets. Once able to do 3 sets without fatigue increase to 7 min work, 3 min rest 3 sets.    Person(s) Educated Patient   Methods Explanation;Demonstration   Comprehension Verbalized understanding;Returned demonstration          PT Short Term Goals - 05/18/14 0845    PT SHORT TERM GOAL #1   Title donnes prosthesis with new suspension correctly independently. (Target Date: 06/17/14)   Time 4   Period Weeks   Status New   PT SHORT TERM GOAL #2   Title demonstrates understanding of initial HEP. (Target Date: 06/17/14)   Time 4   Period Weeks   Status New   PT SHORT TERM GOAL #3   Title wears prosthesis >70% of awake hours >/= 5 days /wk. (Target Date: 06/17/14)   Time 4   Period Weeks   Status New   PT SHORT TERM GOAL #4   Title ambulates 100' with rolling walker & prosthesis with cues only for deviations. (Target Date: 06/17/14)   Time 4   Period Weeks   Status New   PT SHORT TERM GOAL #5   Title stands 30sec without UE support with supervision. (Target Date: 06/17/14)   Time 4   Period Weeks   Status New           PT Long Term Goals - 05/18/14 0845    PT LONG TERM GOAL #1   Title wears prosthesis >80% of awake hours daily. (Target Date: 07/15/14)   Time 8   Period Weeks   Status New   PT LONG TERM GOAL #2   Title verbalize understanding of ongoing fitness plan. (Target Date: 07/15/14)   Time 8   Period Weeks   Status New   PT LONG TERM GOAL #  3   Title ambulates 200' with rolling walker & prosthesis modified independent. (Target Date: 07/15/14)   Time 8   Period Weeks   Status New   PT LONG TERM GOAL #4   Title Berg Balance >18/56 (Target Date: 07/15/14)   Time 8   Period Weeks   Status New   PT LONG TERM  GOAL #5   Title negotiate ramp, curb & stairs (1 rail) with rolling walker & prosthesis modified independent. (Target Date: 07/15/14)   Time 8   Period Weeks   Status New               Plan - 05/25/14 1004    Clinical Impression Statement Patient needs maximal tactile cues to isolate limb / hip motion from pelvis retraction & trunk motion to stabilze prosthetic knee in stance.   Pt will benefit from skilled therapeutic intervention in order to improve on the following deficits Abnormal gait;Cardiopulmonary status limiting activity;Decreased activity tolerance;Decreased balance;Decreased endurance;Decreased knowledge of use of DME;Decreased mobility;Decreased range of motion;Decreased strength;Pain;Other (comment)  prosthetic dependency   Rehab Potential Good   PT Frequency 2x / week   PT Duration 8 weeks   PT Treatment/Interventions ADLs/Self Care Home Management;DME Instruction;Gait training;Stair training;Functional mobility training;Therapeutic activities;Therapeutic exercise;Balance training;Neuromuscular re-education;Patient/family education;Other (comment)  prosthetic training   PT Next Visit Plan strength including core, endurance, Prosthetic training in parallel bars & rolling walker   Consulted and Agree with Plan of Care Patient        Problem List Patient Active Problem List   Diagnosis Date Noted  . OAB (overactive bladder) 02/07/2014  . Wound disruption, post-op, skin 05/28/2013  . Encounter for therapeutic drug monitoring 04/22/2013  . Wound infection 04/16/2013  . Chronic infection of amputation stump 04/13/2013  . Phantom limb pain 03/24/2013  . Wound drainage 03/23/2013  . Encounter for staple removal 03/09/2013  . Acute blood loss anemia 02/10/2013  . Chronic kidney disease 02/10/2013  . Unilateral AKA 02/09/2013  . Nontraumatic ischemic infarction of muscle of lower leg 02/04/2013  . Long term (current) use of anticoagulants 02/01/2013  . Pain, limb,  left-Leg 01/21/2013  . Aftercare following surgery of the circulatory system, [place] 01/12/2013  . PVD (peripheral vascular disease) 12/22/2012  . Swelling of limb 10/07/2012  . Subclavian artery stenosis 09/08/2012  . Peripheral vascular disease 08/18/2012  . Fever 07/08/2012  . Leg edema 06/02/2012  . Cardiomyopathy, ischemic 05/18/2012  . S/P ICD (internal cardiac defibrillator) procedure 05/18/2012  . Diabetes 05/18/2012  . Pain in limb 02/07/2012  . Atherosclerosis of native arteries of the extremities with intermittent claudication 12/17/2011  . Atherosclerotic PVD with intermittent claudication 10/15/2011  . Type I (juvenile type) diabetes mellitus with peripheral circulatory disorders, uncontrolled 09/06/2011  . PAD (peripheral artery disease) 09/04/2011  . CAD (coronary artery disease)   . Burning sensation of feet 05/10/2011  . Bursitis of right hip 05/10/2011  . Cervicalgia 05/10/2011  . Balance disorder 05/10/2011  . Neurogenic claudication 05/10/2011  . Lumbago 05/10/2011  . Chronic systolic congestive heart failure 07/06/2010  . ECZEMA 01/17/2010  . RENAL INSUFFICIENCY 11/01/2009  . UTI 11/01/2009  . CELLULITIS, LEG, RIGHT 08/22/2009  . LYMPHADENOPATHY, REACTIVE 08/22/2009  . PERIPHERAL NEUROPATHY 01/24/2009  . DIZZINESS 12/29/2008  . ABDOMINAL PAIN, GENERALIZED 08/22/2008  . Gout 09/18/2007  . Coronary atherosclerosis 09/18/2007  . HYPERLIPIDEMIA 01/23/2007  . Essential hypertension 01/23/2007  . Atrial fibrillation 01/23/2007  . BENIGN PROSTATIC HYPERTROPHY 01/23/2007  . LATERAL EPICONDYLITIS 01/23/2007  Jamey Reas PT, DPT 05/25/2014, 10:08 AM  [facility] [address] [place], [place], [address] Phone: [phone]   Fax:  [phone]

## 2014-05-30 ENCOUNTER — Ambulatory Visit (INDEPENDENT_AMBULATORY_CARE_PROVIDER_SITE_OTHER): Payer: Commercial Managed Care - HMO | Admitting: *Deleted

## 2014-05-30 ENCOUNTER — Encounter: Payer: Self-pay | Admitting: Physical Therapy

## 2014-05-30 ENCOUNTER — Ambulatory Visit: Payer: Commercial Managed Care - HMO | Admitting: Physical Therapy

## 2014-05-30 DIAGNOSIS — R2689 Other abnormalities of gait and mobility: Secondary | ICD-10-CM

## 2014-05-30 DIAGNOSIS — I255 Ischemic cardiomyopathy: Secondary | ICD-10-CM

## 2014-05-30 DIAGNOSIS — R531 Weakness: Secondary | ICD-10-CM

## 2014-05-30 DIAGNOSIS — R269 Unspecified abnormalities of gait and mobility: Secondary | ICD-10-CM | POA: Diagnosis not present

## 2014-05-30 DIAGNOSIS — I5022 Chronic systolic (congestive) heart failure: Secondary | ICD-10-CM | POA: Diagnosis not present

## 2014-05-30 DIAGNOSIS — Z89612 Acquired absence of left leg above knee: Secondary | ICD-10-CM

## 2014-05-30 DIAGNOSIS — Z95810 Presence of automatic (implantable) cardiac defibrillator: Secondary | ICD-10-CM

## 2014-05-30 DIAGNOSIS — M24652 Ankylosis, left hip: Secondary | ICD-10-CM

## 2014-05-30 DIAGNOSIS — Z7409 Other reduced mobility: Secondary | ICD-10-CM

## 2014-05-30 LAB — MDC_IDC_ENUM_SESS_TYPE_REMOTE
Battery Voltage: 3 V
Brady Statistic AP VP Percent: 0 %
Brady Statistic AS VP Percent: 99.69 %
Brady Statistic AS VS Percent: 0.31 %
Brady Statistic RA Percent Paced: 0 %
Brady Statistic RV Percent Paced: 99.78 %
Date Time Interrogation Session: 20160328151147
HighPow Impedance: 44 Ohm
HighPow Impedance: 57 Ohm
Lead Channel Impedance Value: 1064 Ohm
Lead Channel Impedance Value: 570 Ohm
Lead Channel Impedance Value: 570 Ohm
Lead Channel Impedance Value: 646 Ohm
Lead Channel Pacing Threshold Amplitude: 0.75 V
Lead Channel Pacing Threshold Amplitude: 0.875 V
Lead Channel Sensing Intrinsic Amplitude: 0.25 mV
Lead Channel Sensing Intrinsic Amplitude: 17.875 mV
Lead Channel Setting Pacing Amplitude: 2.5 V
Lead Channel Setting Pacing Pulse Width: 0.4 ms
Lead Channel Setting Pacing Pulse Width: 0.4 ms
Lead Channel Setting Sensing Sensitivity: 0.3 mV
MDC IDC MSMT BATTERY REMAINING LONGEVITY: 104 mo
MDC IDC MSMT LEADCHNL LV PACING THRESHOLD PULSEWIDTH: 0.4 ms
MDC IDC MSMT LEADCHNL RA IMPEDANCE VALUE: 342 Ohm
MDC IDC MSMT LEADCHNL RV IMPEDANCE VALUE: 494 Ohm
MDC IDC MSMT LEADCHNL RV PACING THRESHOLD PULSEWIDTH: 0.4 ms
MDC IDC SET LEADCHNL LV PACING AMPLITUDE: 2 V
MDC IDC SET ZONE DETECTION INTERVAL: 320 ms
MDC IDC SET ZONE DETECTION INTERVAL: 450 ms
MDC IDC STAT BRADY AP VS PERCENT: 0 %
Zone Setting Detection Interval: 290 ms
Zone Setting Detection Interval: 410 ms

## 2014-05-30 NOTE — Progress Notes (Signed)
Remote ICD transmission.   

## 2014-05-30 NOTE — Therapy (Signed)
[facility] [address] [place], [place], [address] Phone: [phone]   Fax:  [phone]  Physical Therapy Treatment  Patient Details  Name: [name] MRN: [id number] Date of Birth: March 31, 1934 Referring Provider:  Laurey Morale, MD  Encounter Date: 05/30/2014      PT End of Session - 05/30/14 0930    Visit Number 4   Number of Visits 17   Date for PT Re-Evaluation 07/15/04   PT Start Time 0845   PT Stop Time 0930   PT Time Calculation (min) 45 min   Equipment Utilized During Treatment Gait belt   Activity Tolerance Patient limited by fatigue   Behavior During Therapy [facility] for tasks assessed/performed      Past Medical History  Diagnosis Date  . Gout   . Benign prostatic hypertrophy     takes Proscar daily  . Atrial fibrillation     takes Warfarin daily  . Chronic systolic dysfunction of left ventricle     a. mixed ischemic and nonischemic CM,  EF 35%. b. s/p AICD implantation.  . ED (erectile dysfunction)   . Arthritis   . Pacemaker     medtronic  . CAD (coronary artery disease)     a. s/p mid LAD stenting with DES 2008 Dr. Daneen Schick  . ICD (implantable cardiac defibrillator) in place     medtronic, Dr. Rayann Heman  . ICD (implantable cardiac defibrillator) in place   . Sleep apnea     hx of "had surgery for"  . Automatic implantable cardioverter-defibrillator in situ   . Depression   . Numbness and tingling     Hx; 14f left foot  . PAD (peripheral artery disease)     Severe by PV angiogram 09/2011  . Renal artery stenosis     s/p stenting 2009  . PAD (peripheral artery disease)     s/p multiple LLE bypass grafts; left mid-distal SCA occlusion by 08/2012 duplex  . Hypertension     takes Carvedilol and Losartan daily  . Constipation     takes Miralax daily as needed and Colace daily   . Type II diabetes mellitus     takes Novolog 70/30  . CHF (congestive heart failure)     takes Lasix  daily  . History of MRSA infection   . Insomnia     TAKES TRAZODONE NIGHTLY    Past Surgical History  Procedure Laterality Date  . Turp vaporization    . Cardiac defibrillator placement  12/26/09    pacemaker combo  . Cervical epidural injection  2013  . Femoral-tibial bypass graft  09/25/2011    Procedure: BYPASS GRAFT FEMORAL-TIBIAL ARTERY;  Surgeon: Mal Misty, MD;  [name];  Service: Vascular;  Laterality: Left;  Left Femoral - Anterior Tibial Bypass;  saphenous vein graft from left leg  . Intraoperative arteriogram  09/25/2011    Procedure: INTRA OPERATIVE ARTERIOGRAM;  Surgeon: Mal Misty, MD;  Location: [place];  Service: Vascular;  Laterality: Left;  . Femoral-tibial bypass graft  02/07/2012    Procedure: BYPASS GRAFT FEMORAL-TIBIAL ARTERY;  Surgeon: Rosetta Posner, MD;  Location: [place];  Service: Vascular;  Laterality: Left;  Thrombectomy Left Femoral - Tibial Bypass Graft  . Embolectomy  02/07/2012    Procedure: EMBOLECTOMY;  Surgeon: Rosetta Posner, MD;  Location: [place];  Service: Vascular;  Laterality: Left;  . Femoral-tibial bypass graft  04/03/2012    Procedure: BYPASS GRAFT FEMORAL-TIBIAL ARTERY;  Surgeon: Mal Misty, MD;  Location: [place];  Service: Vascular;  Laterality: Left;  Redo  . Insert / replace / remove pacemaker  2007  . Coronary angioplasty with stent placement  ~ 2000  . Coronary angioplasty    . Uvulopalatopharyngoplasty (uppp)/tonsillectomy/septoplasty  06/30/2003    Archie Endo 06/30/2003 (07/10/2012)  . Shoulder open rotator cuff repair Right 2001    repair of lacerated right/notes 10/11/1999  (07/10/2012)  . Foot surgery Right 03/20/2001    "have plates and screws in; didn't break it" (07/10/2012)  . Carpal tunnel release Right 2002    Archie Endo 03/20/2001 (07/10/2012)  . Biceps tendon repair Right 2001    Archie Endo 03/20/2001 (07/10/2012)  . Renal artery stent  2009  . Femoral-popliteal bypass graft Left 09/16/2012    Procedure: LEFT FEMORAL-POPLITEAL BYPASS GRAFT  WITH GORTEX Propaten Graft 6x80 Thin Wall and Left lower leg Angiogram;  Surgeon: Mal Misty, MD;  Location: [place];  Service: Vascular;  Laterality: Left;  . Colonoscopy      Hx; of  . Tonsillectomy    . Adenoidectomy      Hx; of   . Femoral-tibial bypass graft Left 09/30/2012    Procedure: REDO LEFT FEMORAL-ANTERIOR TIBIAL ARTERY BYPASS USING COMPOSITE CEPHALIC AND BASILIC VEIN GRAFT FROM LEFT ARM;  Surgeon: Mal Misty, MD;  Location: [place];  Service: Vascular;  Laterality: Left;  . I&d extremity Left 10/21/2012    Procedure: EXPLORATION AND DEBRIDEMENT OF LEFT GROIN WOUND;  Surgeon: Mal Misty, MD;  Location: [place];  Service: Vascular;  Laterality: Left;  . Patch angioplasty Left 10/21/2012    Procedure: PATCH ANGIOPLASTY;  Surgeon: Mal Misty, MD;  Location: [place];  Service: Vascular;  Laterality: Left;  . Groin debridement Left 11/12/2012    Procedure: CLOSURE INGUINAL WOUND;  Surgeon: Mal Misty, MD;  Location: [place];  Service: Vascular;  Laterality: Left;  . Embolectomy Left 12/16/2012    Procedure: THROMBECTOMY  LEFT LEG BYPASS;  Surgeon: Elam Dutch, MD;  Location: [place];  Service: Vascular;  Laterality: Left;  . Leg amputation above knee Left 02/04/2013    DR LAWSON  . Amputation Left 02/04/2013    Procedure: AMPUTATION ABOVE KNEE-LEFT;  Surgeon: Mal Misty, MD;  Location: [place];  Service: Vascular;  Laterality: Left;  . Removal of graft Left 04/16/2013    Procedure: I & D LEFT AKA WOUND, POSSIBLE REMOVAL OF INFECTED GORTEX GRAFT;  Surgeon: Mal Misty, MD;  Location: [place];  Service: Vascular;  Laterality: Left;  . Abdominal aortagram N/A 09/11/2011    Procedure: ABDOMINAL Maxcine Ham;  Surgeon: Wellington Hampshire, MD;  Location: [facility] CATH LAB;  Service: Cardiovascular;  Laterality: N/A;  . Lower extremity angiogram Left 12/25/2011    Procedure: LOWER EXTREMITY ANGIOGRAM;  Surgeon: Serafina Mitchell, MD;  Location: [facility] CATH LAB;  Service: Cardiovascular;   Laterality: Left;  lt leg angio co2  . Abdominal angiogram  12/25/2011    Procedure: ABDOMINAL ANGIOGRAM;  Surgeon: Serafina Mitchell, MD;  Location: [facility] CATH LAB;  Service: Cardiovascular;;  . Lower extremity angiogram N/A 07/07/2012    Procedure: LOWER EXTREMITY ANGIOGRAM;  Surgeon: Conrad La Crosse, MD;  Location: [facility] CATH LAB;  Service: Cardiovascular;  Laterality: N/A;  . Lower extremity angiogram N/A 09/15/2012    Procedure: LOWER EXTREMITY ANGIOGRAM;  Surgeon: Serafina Mitchell, MD;  Location: [facility] CATH LAB;  Service: Cardiovascular;  Laterality: N/A;  . Upper extremity angiogram  09/15/2012  Procedure: UPPER EXTREMITY ANGIOGRAM;  Surgeon: Serafina Mitchell, MD;  Location: [facility] CATH LAB;  Service: Cardiovascular;;  . Biv icd genertaor change out N/A 11/09/2013    Procedure: BIV ICD GENERTAOR CHANGE OUT;  Surgeon: Coralyn Mark, MD;  Location: [facility] CATH LAB;  Service: Cardiovascular;  Laterality: N/A;    There were no vitals filed for this visit.  Visit Diagnosis:  Impaired functional mobility and activity tolerance  Decreased range of motion of hip, left  Status post above knee amputation of left lower extremity  Abnormality of gait  Weakness generalized  Balance problems      Subjective Assessment - 05/30/14 0852    Symptoms (p) Doing exercises at home. Went to [employer] 2x. Work 6 min, rest 2 min 3 sets.   Currently in Pain? (p) Yes                Prosthetic Training: Pre-gait with rolling walker with manual & verbal cues - knee control in step position, wt shift onto prosthesis then full step with right LE.       [place] Adult PT Treatment/Exercise - 05/30/14 0845    Transfers   Sit to Stand With upper extremity assist;With armrests;From chair/3-in-1;5: Supervision  18" high w/c, requires walker to stabilize   Sit to Stand Details (indicate cue type and reason) cues on engaging prosthesis   Stand to Sit With upper extremity assist;With armrests;To elevated surface;To  chair/3-in-1;5: Supervision  from walker to 18" high chair   Stand to Sit Details cues on engaging prosthesis   Ambulation/Gait   Ambulation/Gait Yes   Ambulation/Gait Assistance 5: Supervision;4: Min guard   Ambulation/Gait Assistance Details manual, verbal & demo cues on knee control, wt shift, step length and posture   Ambulation Distance (Feet) 60 Feet  3x   Assistive device Rolling walker;Prosthesis   Gait Pattern Trunk flexed;Abducted - left;Poor foot clearance - left;Trunk rotated posteriorly on left;Decreased trunk rotation;Lateral hip instability;Left hip hike;Left circumduction;Decreased weight shift to left;Decreased hip/knee flexion - left;Decreased stance time - left;Decreased step length - right;Decreased stride length;Step-through pattern   Ambulation Surface Level;Indoor   Gait velocity --   Prosthetics   Prosthetic Care Comments  --   Current prosthetic wear tolerance (days/week)  daily   Current prosthetic wear tolerance (#hours/day)  7-8 hours /day  7 hours is ~50% of awake hours   Current prosthetic weight-bearing tolerance (hours/day)  stands /gait 5 minutes c/o right LE fatigue   Residual limb condition  --   Ankle Exercises: Stretches   Gastroc Stretch 3 reps;20 seconds   Other Stretch Tibialis Ant Stretch passive 3 reps 30 sec                PT Education - 05/30/14 0930    Education provided Yes   Education Details importance of full rest with work-out at [facility] to enable to increase work times sooner. Gait focus to improve quality.   Person(s) Educated Patient   Methods Explanation   Comprehension Verbalized understanding          PT Short Term Goals - 05/18/14 0845    PT SHORT TERM GOAL #1   Title donnes prosthesis with new suspension correctly independently. (Target Date: 06/17/14)   Time 4   Period Weeks   Status New   PT SHORT TERM GOAL #2   Title demonstrates understanding of initial HEP. (Target Date: 06/17/14)   Time 4   Period Weeks    Status New   PT SHORT  TERM GOAL #3   Title wears prosthesis >70% of awake hours >/= 5 days /wk. (Target Date: 06/17/14)   Time 4   Period Weeks   Status New   PT SHORT TERM GOAL #4   Title ambulates 100' with rolling walker & prosthesis with cues only for deviations. (Target Date: 06/17/14)   Time 4   Period Weeks   Status New   PT SHORT TERM GOAL #5   Title stands 30sec without UE support with supervision. (Target Date: 06/17/14)   Time 4   Period Weeks   Status New           PT Long Term Goals - 05/18/14 0845    PT LONG TERM GOAL #1   Title wears prosthesis >80% of awake hours daily. (Target Date: 07/15/14)   Time 8   Period Weeks   Status New   PT LONG TERM GOAL #2   Title verbalize understanding of ongoing fitness plan. (Target Date: 07/15/14)   Time 8   Period Weeks   Status New   PT LONG TERM GOAL #3   Title ambulates 200' with rolling walker & prosthesis modified independent. (Target Date: 07/15/14)   Time 8   Period Weeks   Status New   PT LONG TERM GOAL #4   Title Berg Balance >18/56 (Target Date: 07/15/14)   Time 8   Period Weeks   Status New   PT LONG TERM GOAL #5   Title negotiate ramp, curb & stairs (1 rail) with rolling walker & prosthesis modified independent. (Target Date: 07/15/14)   Time 8   Period Weeks   Status New               Plan - 05/30/14 0930    Clinical Impression Statement Patient improved gait with improved wt shift onto prosthesis in stance but requires constant cueing to implement.    Pt will benefit from skilled therapeutic intervention in order to improve on the following deficits Abnormal gait;Cardiopulmonary status limiting activity;Decreased activity tolerance;Decreased balance;Decreased endurance;Decreased knowledge of use of DME;Decreased mobility;Decreased range of motion;Decreased strength;Pain;Other (comment)  prosthetic dependency   Rehab Potential Good   PT Frequency 2x / week   PT Duration 8 weeks   PT  Treatment/Interventions ADLs/Self Care Home Management;DME Instruction;Gait training;Stair training;Functional mobility training;Therapeutic activities;Therapeutic exercise;Balance training;Neuromuscular re-education;Patient/family education;Other (comment)  prosthetic training   PT Next Visit Plan strength including core, endurance, Prosthetic training in parallel bars & rolling walker   Consulted and Agree with Plan of Care Patient        Problem List Patient Active Problem List   Diagnosis Date Noted  . OAB (overactive bladder) 02/07/2014  . Wound disruption, post-op, skin 05/28/2013  . Encounter for therapeutic drug monitoring 04/22/2013  . Wound infection 04/16/2013  . Chronic infection of amputation stump 04/13/2013  . Phantom limb pain 03/24/2013  . Wound drainage 03/23/2013  . Encounter for staple removal 03/09/2013  . Acute blood loss anemia 02/10/2013  . Chronic kidney disease 02/10/2013  . Unilateral AKA 02/09/2013  . Nontraumatic ischemic infarction of muscle of lower leg 02/04/2013  . Long term (current) use of anticoagulants 02/01/2013  . Pain, limb, left-Leg 01/21/2013  . Aftercare following surgery of the circulatory system, [place] 01/12/2013  . PVD (peripheral vascular disease) 12/22/2012  . Swelling of limb 10/07/2012  . Subclavian artery stenosis 09/08/2012  . Peripheral vascular disease 08/18/2012  . Fever 07/08/2012  . Leg edema 06/02/2012  . Cardiomyopathy, ischemic 05/18/2012  . S/P  ICD (internal cardiac defibrillator) procedure 05/18/2012  . Diabetes 05/18/2012  . Pain in limb 02/07/2012  . Atherosclerosis of native arteries of the extremities with intermittent claudication 12/17/2011  . Atherosclerotic PVD with intermittent claudication 10/15/2011  . Type I (juvenile type) diabetes mellitus with peripheral circulatory disorders, uncontrolled 09/06/2011  . PAD (peripheral artery disease) 09/04/2011  . CAD (coronary artery disease)   . Burning sensation  of feet 05/10/2011  . Bursitis of right hip 05/10/2011  . Cervicalgia 05/10/2011  . Balance disorder 05/10/2011  . Neurogenic claudication 05/10/2011  . Lumbago 05/10/2011  . Chronic systolic congestive heart failure 07/06/2010  . ECZEMA 01/17/2010  . RENAL INSUFFICIENCY 11/01/2009  . UTI 11/01/2009  . CELLULITIS, LEG, RIGHT 08/22/2009  . LYMPHADENOPATHY, REACTIVE 08/22/2009  . PERIPHERAL NEUROPATHY 01/24/2009  . DIZZINESS 12/29/2008  . ABDOMINAL PAIN, GENERALIZED 08/22/2008  . Gout 09/18/2007  . Coronary atherosclerosis 09/18/2007  . HYPERLIPIDEMIA 01/23/2007  . Essential hypertension 01/23/2007  . Atrial fibrillation 01/23/2007  . BENIGN PROSTATIC HYPERTROPHY 01/23/2007  . LATERAL EPICONDYLITIS 01/23/2007    Jamey Reas PT, DPT 05/30/2014, 1:05 PM  [facility] [address] [place], [place], [address] Phone: [phone]   Fax:  [phone]

## 2014-06-01 ENCOUNTER — Encounter: Payer: Self-pay | Admitting: *Deleted

## 2014-06-01 ENCOUNTER — Telehealth: Payer: Self-pay | Admitting: Family Medicine

## 2014-06-01 ENCOUNTER — Encounter: Payer: Self-pay | Admitting: Physical Therapy

## 2014-06-01 ENCOUNTER — Telehealth: Payer: Self-pay | Admitting: *Deleted

## 2014-06-01 ENCOUNTER — Ambulatory Visit: Payer: Commercial Managed Care - HMO | Admitting: Physical Therapy

## 2014-06-01 DIAGNOSIS — R269 Unspecified abnormalities of gait and mobility: Secondary | ICD-10-CM

## 2014-06-01 DIAGNOSIS — R531 Weakness: Secondary | ICD-10-CM

## 2014-06-01 DIAGNOSIS — Z7409 Other reduced mobility: Secondary | ICD-10-CM

## 2014-06-01 NOTE — Telephone Encounter (Signed)
I called [facility] and the medication was shipped out on 05/27/14. I spoke with pt and gave this information.

## 2014-06-01 NOTE — Progress Notes (Signed)
EPIC Encounter for ICM Monitoring  Patient Name: [name] is a 80 y.o. male Date: 06/01/2014 Primary Care Physican: Laurey Morale, MD Primary Cardiologist: Tamala Julian Electrophysiologist: Allred Dry Weight: 184 lbs (with prothesis)       In the past month, have you:  1. Gained more than 2 pounds in a day or more than 5 pounds in a week? no  2. Had changes in your medications (with verification of current medications)? no  3. Had more shortness of breath than is usual for you? no  4. Limited your activity because of shortness of breath? no  5. Not been able to sleep because of shortness of breath? no  6. Had increased swelling in your feet or ankles? no  7. Had symptoms of dehydration (dizziness, dry mouth, increased thirst, decreased urine output) no  8. Had changes in sodium restriction? no  9. Been compliant with medication? Yes   ICM trend:   Follow-up plan: ICM clinic phone appointment: 07/04/14  Copy of note sent to patient's primary care physician, primary cardiologist, and device following physician.  Alvis Lemmings, RN, BSN 06/01/2014 3:58 PM

## 2014-06-01 NOTE — Telephone Encounter (Signed)
I spoke with the patient. 

## 2014-06-01 NOTE — Addendum Note (Signed)
Addended by: Alvis Lemmings C on: 06/01/2014 04:02 PM   Modules accepted: Level of Service

## 2014-06-01 NOTE — Telephone Encounter (Signed)
ICM transmission received. I left a message for the patient to call at his home and cell #'s. 

## 2014-06-01 NOTE — Telephone Encounter (Signed)
Pt request refill of the following: allopurinol (ZYLOPRIM) 100 MG tablet  Pt said he has not received the above med and is asking for it to be resubmitted   Phamacy:  [facility] order

## 2014-06-01 NOTE — Therapy (Signed)
[facility] [address] [place], [place], [address] Phone: [phone]   Fax:  [phone]  Physical Therapy Treatment  Patient Details  Name: [name] MRN: [id number] Date of Birth: 05/29/1934 Referring Provider:  Laurey Morale, MD  Encounter Date: 06/01/2014      PT End of Session - 06/01/14 0854    Visit Number 5   Number of Visits 17   Date for PT Re-Evaluation 07/15/04   PT Start Time 0848   PT Stop Time 0929   PT Time Calculation (min) 41 min   Equipment Utilized During Treatment Gait belt   Activity Tolerance Patient limited by fatigue   Behavior During Therapy [facility] for tasks assessed/performed      Past Medical History  Diagnosis Date  . Gout   . Benign prostatic hypertrophy     takes Proscar daily  . Atrial fibrillation     takes Warfarin daily  . Chronic systolic dysfunction of left ventricle     a. mixed ischemic and nonischemic CM,  EF 35%. b. s/p AICD implantation.  . ED (erectile dysfunction)   . Arthritis   . Pacemaker     medtronic  . CAD (coronary artery disease)     a. s/p mid LAD stenting with DES 2008 Dr. Daneen Schick  . ICD (implantable cardiac defibrillator) in place     medtronic, Dr. Rayann Heman  . ICD (implantable cardiac defibrillator) in place   . Sleep apnea     hx of "had surgery for"  . Automatic implantable cardioverter-defibrillator in situ   . Depression   . Numbness and tingling     Hx; 2f left foot  . PAD (peripheral artery disease)     Severe by PV angiogram 09/2011  . Renal artery stenosis     s/p stenting 2009  . PAD (peripheral artery disease)     s/p multiple LLE bypass grafts; left mid-distal SCA occlusion by 08/2012 duplex  . Hypertension     takes Carvedilol and Losartan daily  . Constipation     takes Miralax daily as needed and Colace daily   . Type II diabetes mellitus     takes Novolog 70/30  . CHF (congestive heart failure)     takes Lasix  daily  . History of MRSA infection   . Insomnia     TAKES TRAZODONE NIGHTLY    Past Surgical History  Procedure Laterality Date  . Turp vaporization    . Cardiac defibrillator placement  12/26/09    pacemaker combo  . Cervical epidural injection  2013  . Femoral-tibial bypass graft  09/25/2011    Procedure: BYPASS GRAFT FEMORAL-TIBIAL ARTERY;  Surgeon: Mal Misty, MD;  Location: [facility] OR;  Service: Vascular;  Laterality: Left;  Left Femoral - Anterior Tibial Bypass;  saphenous vein graft from left leg  . Intraoperative arteriogram  09/25/2011    Procedure: INTRA OPERATIVE ARTERIOGRAM;  Surgeon: Mal Misty, MD;  Location: [facility];  Service: Vascular;  Laterality: Left;  . Femoral-tibial bypass graft  02/07/2012    Procedure: BYPASS GRAFT FEMORAL-TIBIAL ARTERY;  Surgeon: Rosetta Posner, MD;  Location: [place];  Service: Vascular;  Laterality: Left;  Thrombectomy Left Femoral - Tibial Bypass Graft  . Embolectomy  02/07/2012    Procedure: EMBOLECTOMY;  Surgeon: Rosetta Posner, MD;  Location: [place];  Service: Vascular;  Laterality: Left;  . Femoral-tibial bypass graft  04/03/2012    Procedure: BYPASS GRAFT FEMORAL-TIBIAL ARTERY;  Surgeon: Mal Misty, MD;  Location: [place];  Service: Vascular;  Laterality: Left;  Redo  . Insert / replace / remove pacemaker  2007  . Coronary angioplasty with stent placement  ~ 2000  . Coronary angioplasty    . Uvulopalatopharyngoplasty (uppp)/tonsillectomy/septoplasty  06/30/2003    Archie Endo 06/30/2003 (07/10/2012)  . Shoulder open rotator cuff repair Right 2001    repair of lacerated right/notes 10/11/1999  (07/10/2012)  . Foot surgery Right 03/20/2001    "have plates and screws in; didn't break it" (07/10/2012)  . Carpal tunnel release Right 2002    Archie Endo 03/20/2001 (07/10/2012)  . Biceps tendon repair Right 2001    Archie Endo 03/20/2001 (07/10/2012)  . Renal artery stent  2009  . Femoral-popliteal bypass graft Left 09/16/2012    Procedure: LEFT FEMORAL-POPLITEAL BYPASS GRAFT  WITH GORTEX Propaten Graft 6x80 Thin Wall and Left lower leg Angiogram;  Surgeon: Mal Misty, MD;  Location: [place];  Service: Vascular;  Laterality: Left;  . Colonoscopy      Hx; of  . Tonsillectomy    . Adenoidectomy      Hx; of   . Femoral-tibial bypass graft Left 09/30/2012    Procedure: REDO LEFT FEMORAL-ANTERIOR TIBIAL ARTERY BYPASS USING COMPOSITE CEPHALIC AND BASILIC VEIN GRAFT FROM LEFT ARM;  Surgeon: Mal Misty, MD;  Location: [place];  Service: Vascular;  Laterality: Left;  . I&d extremity Left 10/21/2012    Procedure: EXPLORATION AND DEBRIDEMENT OF LEFT GROIN WOUND;  Surgeon: Mal Misty, MD;  Location: [place];  Service: Vascular;  Laterality: Left;  . Patch angioplasty Left 10/21/2012    Procedure: PATCH ANGIOPLASTY;  Surgeon: Mal Misty, MD;  Location: [place];  Service: Vascular;  Laterality: Left;  . Groin debridement Left 11/12/2012    Procedure: CLOSURE INGUINAL WOUND;  Surgeon: Mal Misty, MD;  Location: [place];  Service: Vascular;  Laterality: Left;  . Embolectomy Left 12/16/2012    Procedure: THROMBECTOMY  LEFT LEG BYPASS;  Surgeon: Elam Dutch, MD;  Location: [place];  Service: Vascular;  Laterality: Left;  . Leg amputation above knee Left 02/04/2013    DR LAWSON  . Amputation Left 02/04/2013    Procedure: AMPUTATION ABOVE KNEE-LEFT;  Surgeon: Mal Misty, MD;  Location: [place];  Service: Vascular;  Laterality: Left;  . Removal of graft Left 04/16/2013    Procedure: I & D LEFT AKA WOUND, POSSIBLE REMOVAL OF INFECTED GORTEX GRAFT;  Surgeon: Mal Misty, MD;  Location: [place];  Service: Vascular;  Laterality: Left;  . Abdominal aortagram N/A 09/11/2011    Procedure: ABDOMINAL Maxcine Ham;  Surgeon: Wellington Hampshire, MD;  Location: [facility] CATH LAB;  Service: Cardiovascular;  Laterality: N/A;  . Lower extremity angiogram Left 12/25/2011    Procedure: LOWER EXTREMITY ANGIOGRAM;  Surgeon: Serafina Mitchell, MD;  Location: [facility] CATH LAB;  Service: Cardiovascular;   Laterality: Left;  lt leg angio co2  . Abdominal angiogram  12/25/2011    Procedure: ABDOMINAL ANGIOGRAM;  Surgeon: Serafina Mitchell, MD;  Location: [facility] CATH LAB;  Service: Cardiovascular;;  . Lower extremity angiogram N/A 07/07/2012    Procedure: LOWER EXTREMITY ANGIOGRAM;  Surgeon: Conrad La Crosse, MD;  Location: [facility] CATH LAB;  Service: Cardiovascular;  Laterality: N/A;  . Lower extremity angiogram N/A 09/15/2012    Procedure: LOWER EXTREMITY ANGIOGRAM;  Surgeon: Serafina Mitchell, MD;  Location: [facility] CATH LAB;  Service: Cardiovascular;  Laterality: N/A;  . Upper extremity angiogram  09/15/2012  Procedure: UPPER EXTREMITY ANGIOGRAM;  Surgeon: Serafina Mitchell, MD;  Location: [facility] CATH LAB;  Service: Cardiovascular;;  . Biv icd genertaor change out N/A 11/09/2013    Procedure: BIV ICD GENERTAOR CHANGE OUT;  Surgeon: Coralyn Mark, MD;  Location: [facility] CATH LAB;  Service: Cardiovascular;  Laterality: N/A;    There were no vitals filed for this visit.  Visit Diagnosis:  Impaired functional mobility and activity tolerance  Abnormality of gait  Weakness generalized      Subjective Assessment - 06/01/14 0852    Symptoms Had a rought night last night due to severe pain in left index/middle fingers. Reports they are sore to touch.   Currently in Pain? Yes   Pain Score 8    Pain Location Hand   Pain Orientation Left   Pain Descriptors / Indicators Aching;Sore   Pain Type Chronic pain;Other (Comment)  gout   Pain Onset More than a month ago   Pain Frequency Constant   Aggravating Factors  moving it, making a fist, touch to hand/finger   Pain Relieving Factors rest, medication           [facility] Adult PT Treatment/Exercise - 06/01/14 0856    Transfers   Sit to Stand With upper extremity assist;With armrests;From chair/3-in-1;5: Supervision   Sit to Stand Details (indicate cue type and reason) cues to scoot forward and on use of prosthetic knee                           Stand to Sit With upper extremity  assist;With armrests;To elevated surface;To chair/3-in-1;5: Supervision   Stand to Sit Details cues to engage prosthetic knee   Ambulation/Gait   Ambulation/Gait Yes   Ambulation/Gait Assistance 5: Supervision;4: Min guard   Ambulation/Gait Assistance Details cues on posture, increased right step length and on use of prosthetic knee with gait   Ambulation Distance (Feet) 220 Feet  x 2; 35 feet x 2 reps   Assistive device Rolling walker;Prosthesis   Gait Pattern Step-through pattern;Decreased stride length;Decreased stance time - left;Decreased step length - right;Poor foot clearance - left;Trunk flexed;Decreased trunk rotation   Ambulation Surface Level;Indoor   Knee/Hip Exercises: Aerobic   Stationary Bike Scifit level 2.5 x 8 minutes with goal rpm >/= 60 for strengthening and activity tolerance   Prosthetics   Current prosthetic wear tolerance (days/week)  daily   Current prosthetic wear tolerance (#hours/day)  7-8 hours /day   Residual limb condition  intact per pt report   Education Provided Residual limb care;Proper wear schedule/adjustment   Person(s) Educated Patient   Education Method Explanation   Education Method Verbalized understanding;Needs further instruction   Donning Prosthesis Modified independent (device/increased time)   Doffing Prosthesis Modified independent (device/increased time)           PT Short Term Goals - 05/18/14 0845    PT SHORT TERM GOAL #1   Title donnes prosthesis with new suspension correctly independently. (Target Date: 06/17/14)   Time 4   Period Weeks   Status New   PT SHORT TERM GOAL #2   Title demonstrates understanding of initial HEP. (Target Date: 06/17/14)   Time 4   Period Weeks   Status New   PT SHORT TERM GOAL #3   Title wears prosthesis >70% of awake hours >/= 5 days /wk. (Target Date: 06/17/14)   Time 4   Period Weeks   Status New   PT SHORT TERM GOAL #4  Title ambulates 100' with rolling walker & prosthesis with cues only  for deviations. (Target Date: 06/17/14)   Time 4   Period Weeks   Status New   PT SHORT TERM GOAL #5   Title stands 30sec without UE support with supervision. (Target Date: 06/17/14)   Time 4   Period Weeks   Status New           PT Long Term Goals - 05/18/14 0845    PT LONG TERM GOAL #1   Title wears prosthesis >80% of awake hours daily. (Target Date: 07/15/14)   Time 8   Period Weeks   Status New   PT LONG TERM GOAL #2   Title verbalize understanding of ongoing fitness plan. (Target Date: 07/15/14)   Time 8   Period Weeks   Status New   PT LONG TERM GOAL #3   Title ambulates 200' with rolling walker & prosthesis modified independent. (Target Date: 07/15/14)   Time 8   Period Weeks   Status New   PT LONG TERM GOAL #4   Title Berg Balance >18/56 (Target Date: 07/15/14)   Time 8   Period Weeks   Status New   PT LONG TERM GOAL #5   Title negotiate ramp, curb & stairs (1 rail) with rolling walker & prosthesis modified independent. (Target Date: 07/15/14)   Time 8   Period Weeks   Status New           Plan - 06/01/14 0855    Clinical Impression Statement Pt with increased gait distance today and improved use of prosthetic knee with gait. Still needs cues with transfers to engage prosthetic knee. Needs rest breaks due to fatigue/shortness of breath between actvities and during Scifit. Progressing toward goals.                                                Pt will benefit from skilled therapeutic intervention in order to improve on the following deficits Abnormal gait;Cardiopulmonary status limiting activity;Decreased activity tolerance;Decreased balance;Decreased endurance;Decreased knowledge of use of DME;Decreased mobility;Decreased range of motion;Decreased strength;Pain;Other (comment)  prosthetic dependency   Rehab Potential Good   PT Frequency 2x / week   PT Duration 8 weeks   PT Treatment/Interventions ADLs/Self Care Home Management;DME Instruction;Gait  training;Stair training;Functional mobility training;Therapeutic activities;Therapeutic exercise;Balance training;Neuromuscular re-education;Patient/family education;Other (comment)  prosthetic training   PT Next Visit Plan strength including core, endurance, Prosthetic training in gait, barriers with walker   Consulted and Agree with Plan of Care Patient        Problem List Patient Active Problem List   Diagnosis Date Noted  . OAB (overactive bladder) 02/07/2014  . Wound disruption, post-op, skin 05/28/2013  . Encounter for therapeutic drug monitoring 04/22/2013  . Wound infection 04/16/2013  . Chronic infection of amputation stump 04/13/2013  . Phantom limb pain 03/24/2013  . Wound drainage 03/23/2013  . Encounter for staple removal 03/09/2013  . Acute blood loss anemia 02/10/2013  . Chronic kidney disease 02/10/2013  . Unilateral AKA 02/09/2013  . Nontraumatic ischemic infarction of muscle of lower leg 02/04/2013  . Long term (current) use of anticoagulants 02/01/2013  . Pain, limb, left-Leg 01/21/2013  . Aftercare following surgery of the circulatory system, [place] 01/12/2013  . PVD (peripheral vascular disease) 12/22/2012  . Swelling of limb 10/07/2012  . Subclavian artery stenosis 09/08/2012  .  Peripheral vascular disease 08/18/2012  . Fever 07/08/2012  . Leg edema 06/02/2012  . Cardiomyopathy, ischemic 05/18/2012  . S/P ICD (internal cardiac defibrillator) procedure 05/18/2012  . Diabetes 05/18/2012  . Pain in limb 02/07/2012  . Atherosclerosis of native arteries of the extremities with intermittent claudication 12/17/2011  . Atherosclerotic PVD with intermittent claudication 10/15/2011  . Type I (juvenile type) diabetes mellitus with peripheral circulatory disorders, uncontrolled 09/06/2011  . PAD (peripheral artery disease) 09/04/2011  . CAD (coronary artery disease)   . Burning sensation of feet 05/10/2011  . Bursitis of right hip 05/10/2011  . Cervicalgia  05/10/2011  . Balance disorder 05/10/2011  . Neurogenic claudication 05/10/2011  . Lumbago 05/10/2011  . Chronic systolic congestive heart failure 07/06/2010  . ECZEMA 01/17/2010  . RENAL INSUFFICIENCY 11/01/2009  . UTI 11/01/2009  . CELLULITIS, LEG, RIGHT 08/22/2009  . LYMPHADENOPATHY, REACTIVE 08/22/2009  . PERIPHERAL NEUROPATHY 01/24/2009  . DIZZINESS 12/29/2008  . ABDOMINAL PAIN, GENERALIZED 08/22/2008  . Gout 09/18/2007  . Coronary atherosclerosis 09/18/2007  . HYPERLIPIDEMIA 01/23/2007  . Essential hypertension 01/23/2007  . Atrial fibrillation 01/23/2007  . BENIGN PROSTATIC HYPERTROPHY 01/23/2007  . LATERAL EPICONDYLITIS 01/23/2007    Willow Ora 06/01/2014, 1:11 PM  Willow Ora, PTA, [facility] [address] [place], [place] [address] [phone] 06/01/2014, 1:11 PM

## 2014-06-03 ENCOUNTER — Encounter
Payer: Commercial Managed Care - HMO | Attending: Physical Medicine & Rehabilitation | Admitting: Physical Medicine & Rehabilitation

## 2014-06-03 ENCOUNTER — Other Ambulatory Visit: Payer: Self-pay | Admitting: Physical Medicine & Rehabilitation

## 2014-06-03 ENCOUNTER — Encounter: Payer: Self-pay | Admitting: Physical Medicine & Rehabilitation

## 2014-06-03 VITALS — BP 150/74 | HR 70 | Resp 14

## 2014-06-03 DIAGNOSIS — M79605 Pain in left leg: Secondary | ICD-10-CM

## 2014-06-03 DIAGNOSIS — M79672 Pain in left foot: Secondary | ICD-10-CM | POA: Diagnosis not present

## 2014-06-03 DIAGNOSIS — G546 Phantom limb syndrome with pain: Secondary | ICD-10-CM | POA: Diagnosis not present

## 2014-06-03 DIAGNOSIS — S78112A Complete traumatic amputation at level between left hip and knee, initial encounter: Secondary | ICD-10-CM

## 2014-06-03 DIAGNOSIS — Z5181 Encounter for therapeutic drug level monitoring: Secondary | ICD-10-CM

## 2014-06-03 DIAGNOSIS — G894 Chronic pain syndrome: Secondary | ICD-10-CM | POA: Diagnosis not present

## 2014-06-03 DIAGNOSIS — Z79899 Other long term (current) drug therapy: Secondary | ICD-10-CM

## 2014-06-03 DIAGNOSIS — M1000 Idiopathic gout, unspecified site: Secondary | ICD-10-CM

## 2014-06-03 DIAGNOSIS — Z89612 Acquired absence of left leg above knee: Secondary | ICD-10-CM

## 2014-06-03 MED ORDER — OXYCODONE-ACETAMINOPHEN 7.5-325 MG PO TABS: 1.0000 | ORAL_TABLET | Freq: Four times a day (QID) | ORAL | Status: DC | PRN

## 2014-06-03 MED ORDER — PREDNISONE 20 MG PO TABS: 10.0000 mg | ORAL_TABLET | ORAL | Status: DC

## 2014-06-03 MED ORDER — TOPIRAMATE 25 MG PO TABS: 25.0000 mg | ORAL_TABLET | Freq: Every day | ORAL | Status: DC

## 2014-06-03 NOTE — Patient Instructions (Signed)
FOLLOW UP WITH [facility] REGARDING YOUR SOCKET FIT

## 2014-06-03 NOTE — Progress Notes (Signed)
Subjective:    Patient ID: [name], male    DOB: 1934/05/28, 80 y.o.   MRN: [id number]  HPI   [name] is back regarding his left AKA and associated gait and pain issues. He received his new suspension a couple weeks ago. He has had some fit issues with the leg again. He hasn't returned to see [facility] yet.   His pain is at the stump and more so the phantom limb. Percocet helps.    Pain Inventory Average Pain 7 Pain Right Now 8 My pain is constant, sharp, stabbing and aching  In the last 24 hours, has pain interfered with the following? General activity 7 Relation with others 7 Enjoyment of life 9 What TIME of day is your pain at its worst? morning and night Sleep (in general) Poor  Pain is worse with: inactivity and some activites Pain improves with: medication Relief from Meds: 4  Mobility use a walker how many minutes can you walk? 10 ability to climb steps?  yes do you drive?  yes  Function retired  Neuro/Psych bladder control problems anxiety  Prior Studies Any changes since last visit?  no  Physicians involved in your care Any changes since last visit?  no   Family History  Problem Relation Age of Onset  . Cancer      breast/fhx  . Heart disease      fhx  . Diabetes Neg Hx   . Cancer Father    History   Social History  . Marital Status: Widowed    Spouse Name: N/A  . Number of Children: N/A  . Years of Education: N/A   Occupational History  . Retired    Social History Main Topics  . Smoking status: Never Smoker   . Smokeless tobacco: Never Used     Comment: 1 cigar per week  . Alcohol Use: 2.4 oz/week    2 Glasses of wine, 2 Shots of liquor per week     Comment: maybe once a week  . Drug Use: No  . Sexual Activity: Not Currently   Other Topics Concern  . None   Social History Narrative   Lives in [place] with significant other,  [name].   Past Surgical History  Procedure Laterality Date  . Turp vaporization      . Cardiac defibrillator placement  12/26/09    pacemaker combo  . Cervical epidural injection  2013  . Femoral-tibial bypass graft  09/25/2011    Procedure: BYPASS GRAFT FEMORAL-TIBIAL ARTERY;  Surgeon: Mal Misty, MD;  Location: [facility] OR;  Service: Vascular;  Laterality: Left;  Left Femoral - Anterior Tibial Bypass;  saphenous vein graft from left leg  . Intraoperative arteriogram  09/25/2011    Procedure: INTRA OPERATIVE ARTERIOGRAM;  Surgeon: Mal Misty, MD;  Location: [place];  Service: Vascular;  Laterality: Left;  . Femoral-tibial bypass graft  02/07/2012    Procedure: BYPASS GRAFT FEMORAL-TIBIAL ARTERY;  Surgeon: Rosetta Posner, MD;  Location: [place];  Service: Vascular;  Laterality: Left;  Thrombectomy Left Femoral - Tibial Bypass Graft  . Embolectomy  02/07/2012    Procedure: EMBOLECTOMY;  Surgeon: Rosetta Posner, MD;  Location: [place];  Service: Vascular;  Laterality: Left;  . Femoral-tibial bypass graft  04/03/2012    Procedure: BYPASS GRAFT FEMORAL-TIBIAL ARTERY;  Surgeon: Mal Misty, MD;  Location: [place];  Service: Vascular;  Laterality: Left;  Redo  . Insert / replace / remove pacemaker  2007  .  Coronary angioplasty with stent placement  ~ 2000  . Coronary angioplasty    . Uvulopalatopharyngoplasty (uppp)/tonsillectomy/septoplasty  06/30/2003    Archie Endo 06/30/2003 (07/10/2012)  . Shoulder open rotator cuff repair Right 2001    repair of lacerated right/notes 10/11/1999  (07/10/2012)  . Foot surgery Right 03/20/2001    "have plates and screws in; didn't break it" (07/10/2012)  . Carpal tunnel release Right 2002    Archie Endo 03/20/2001 (07/10/2012)  . Biceps tendon repair Right 2001    Archie Endo 03/20/2001 (07/10/2012)  . Renal artery stent  2009  . Femoral-popliteal bypass graft Left 09/16/2012    Procedure: LEFT FEMORAL-POPLITEAL BYPASS GRAFT WITH GORTEX Propaten Graft 6x80 Thin Wall and Left lower leg Angiogram;  Surgeon: Mal Misty, MD;  Location: [place];  Service: Vascular;  Laterality: Left;   . Colonoscopy      Hx; of  . Tonsillectomy    . Adenoidectomy      Hx; of   . Femoral-tibial bypass graft Left 09/30/2012    Procedure: REDO LEFT FEMORAL-ANTERIOR TIBIAL ARTERY BYPASS USING COMPOSITE CEPHALIC AND BASILIC VEIN GRAFT FROM LEFT ARM;  Surgeon: Mal Misty, MD;  Location: [place];  Service: Vascular;  Laterality: Left;  . I&d extremity Left 10/21/2012    Procedure: EXPLORATION AND DEBRIDEMENT OF LEFT GROIN WOUND;  Surgeon: Mal Misty, MD;  Location: [place];  Service: Vascular;  Laterality: Left;  . Patch angioplasty Left 10/21/2012    Procedure: PATCH ANGIOPLASTY;  Surgeon: Mal Misty, MD;  Location: [place];  Service: Vascular;  Laterality: Left;  . Groin debridement Left 11/12/2012    Procedure: CLOSURE INGUINAL WOUND;  Surgeon: Mal Misty, MD;  Location: [place];  Service: Vascular;  Laterality: Left;  . Embolectomy Left 12/16/2012    Procedure: THROMBECTOMY  LEFT LEG BYPASS;  Surgeon: Elam Dutch, MD;  Location: [place];  Service: Vascular;  Laterality: Left;  . Leg amputation above knee Left 02/04/2013    DR LAWSON  . Amputation Left 02/04/2013    Procedure: AMPUTATION ABOVE KNEE-LEFT;  Surgeon: Mal Misty, MD;  Location: [place];  Service: Vascular;  Laterality: Left;  . Removal of graft Left 04/16/2013    Procedure: I & D LEFT AKA WOUND, POSSIBLE REMOVAL OF INFECTED GORTEX GRAFT;  Surgeon: Mal Misty, MD;  Location: [place];  Service: Vascular;  Laterality: Left;  . Abdominal aortagram N/A 09/11/2011    Procedure: ABDOMINAL Maxcine Ham;  Surgeon: Wellington Hampshire, MD;  Location: [facility] CATH LAB;  Service: Cardiovascular;  Laterality: N/A;  . Lower extremity angiogram Left 12/25/2011    Procedure: LOWER EXTREMITY ANGIOGRAM;  Surgeon: Serafina Mitchell, MD;  Location: [facility] CATH LAB;  Service: Cardiovascular;  Laterality: Left;  lt leg angio co2  . Abdominal angiogram  12/25/2011    Procedure: ABDOMINAL ANGIOGRAM;  Surgeon: Serafina Mitchell, MD;  Location: [facility] CATH LAB;   Service: Cardiovascular;;  . Lower extremity angiogram N/A 07/07/2012    Procedure: LOWER EXTREMITY ANGIOGRAM;  Surgeon: Conrad Waldo, MD;  Location: [facility] CATH LAB;  Service: Cardiovascular;  Laterality: N/A;  . Lower extremity angiogram N/A 09/15/2012    Procedure: LOWER EXTREMITY ANGIOGRAM;  Surgeon: Serafina Mitchell, MD;  Location: [facility] CATH LAB;  Service: Cardiovascular;  Laterality: N/A;  . Upper extremity angiogram  09/15/2012    Procedure: UPPER EXTREMITY ANGIOGRAM;  Surgeon: Serafina Mitchell, MD;  Location[facility] CATH LAB;  Service: Cardiovascular;;  . Biv icd genertaor change out N/A 11/09/2013  Procedure: BIV ICD GENERTAOR CHANGE OUT;  Surgeon: Coralyn Mark, MD;  Location: [facility] CATH LAB;  Service: Cardiovascular;  Laterality: N/A;   Past Medical History  Diagnosis Date  . Gout   . Benign prostatic hypertrophy     takes Proscar daily  . Atrial fibrillation     takes Warfarin daily  . Chronic systolic dysfunction of left ventricle     a. mixed ischemic and nonischemic CM,  EF 35%. b. s/p AICD implantation.  . ED (erectile dysfunction)   . Arthritis   . Pacemaker     medtronic  . CAD (coronary artery disease)     a. s/p mid LAD stenting with DES 2008 Dr. Daneen Schick  . ICD (implantable cardiac defibrillator) in place     medtronic, Dr. Rayann Heman  . ICD (implantable cardiac defibrillator) in place   . Sleep apnea     hx of "had surgery for"  . Automatic implantable cardioverter-defibrillator in situ   . Depression   . Numbness and tingling     Hx; 85f left foot  . PAD (peripheral artery disease)     Severe by PV angiogram 09/2011  . Renal artery stenosis     s/p stenting 2009  . PAD (peripheral artery disease)     s/p multiple LLE bypass grafts; left mid-distal SCA occlusion by 08/2012 duplex  . Hypertension     takes Carvedilol and Losartan daily  . Constipation     takes Miralax daily as needed and Colace daily   . Type II diabetes mellitus     takes Novolog 70/30  . CHF  (congestive heart failure)     takes Lasix daily  . History of MRSA infection   . Insomnia     TAKES TRAZODONE NIGHTLY   BP 150/74 mmHg  Pulse 70  Resp 14  SpO2 100%  Opioid Risk Score:   Fall Risk Score: Moderate Fall Risk (6-13 points)`1  Depression screen PHQ 2/9  Depression screen PHQ 2/9 06/03/2014  Decreased Interest 2  Down, Depressed, Hopeless 1  PHQ - 2 Score 3  Altered sleeping 3  Tired, decreased energy 2  Change in appetite 0  Feeling bad or failure about yourself  0  Trouble concentrating 1  Moving slowly or fidgety/restless 0  Suicidal thoughts 0  PHQ-9 Score 9     Review of Systems  Constitutional: Negative.   HENT: Negative.   Eyes: Negative.   Respiratory: Negative.   Cardiovascular: Negative.   Gastrointestinal: Negative.   Genitourinary: Negative.        Bladder control problem  Musculoskeletal: Positive for arthralgias.       Left hand and foot pain  Skin: Negative.   Allergic/Immunologic: Negative.   Hematological: Negative.   Psychiatric/Behavioral: The patient is nervous/anxious.        Objective:   Physical Exam  Constitutional: He is oriented to person, place, and time. He appears well-developed and well-nourished.  HENT:  Head: Normocephalic and atraumatic.  Eyes: Conjunctivae are normal. Pupils are equal, round, and reactive to light.  Neck: Neck supple.  Cardiovascular: Normal rate and regular rhythm. No rubs, gallops. Pacer site dressed  No murmur heard.  Respiratory: Effort normal and breath sounds normal. No respiratory distress. He has no wheezes.  GI: Soft. Bowel sounds are normal. He exhibits no distension. There is no tenderness.  Musculoskeletal:  L-AKA prosthesis still in place but appears loose. Rotation noted. Limb has shrunk in size. Mild warmth and erythema over right  middle MCP and DIP. Tender to palpation also.  Neurological: He is alert and oriented to person, place, and time.  Follows commands. Strength grossly  intact in UE's at 5/5. RLE is 4/5 prox to 5/5 distally. Left HF 4/5. mild sensory loss in distal right leg/foot.  Skin: Intact.  Psychiatric: He has a normal mood and affect. His speech is normal and behavior is normal. Judgment and thought content normal. Cognition and memory are normal.     Assessment/Plan:  1. Functional deficits secondary to left AKA.  2. Pain Management: cannot tolerate increased gabapentin  -trial of topamax -continue 7.5mg  percocet  -still consider long acting opiate  -follow up with [facility] regarding prosthetic fit  3. Follow up with me in about 2 months . 15 minutes of face to face patient care time were spent during this visit. All questions were encouraged and answered.  4. Gout---resumed allopurinol  -will try prednisone taper for acute flare of right middle finger  Follow up with me or NP in about 2 months

## 2014-06-04 LAB — PMP ALCOHOL METABOLITE (ETG): Ethyl Glucuronide (EtG): NEGATIVE ng/mL

## 2014-06-07 ENCOUNTER — Ambulatory Visit: Payer: Commercial Managed Care - HMO | Attending: Family Medicine | Admitting: Physical Therapy

## 2014-06-07 ENCOUNTER — Encounter: Payer: Self-pay | Admitting: Physical Therapy

## 2014-06-07 DIAGNOSIS — R531 Weakness: Secondary | ICD-10-CM | POA: Diagnosis not present

## 2014-06-07 DIAGNOSIS — R29818 Other symptoms and signs involving the nervous system: Secondary | ICD-10-CM | POA: Insufficient documentation

## 2014-06-07 DIAGNOSIS — R269 Unspecified abnormalities of gait and mobility: Secondary | ICD-10-CM | POA: Diagnosis not present

## 2014-06-07 DIAGNOSIS — Z7409 Other reduced mobility: Secondary | ICD-10-CM | POA: Diagnosis not present

## 2014-06-07 DIAGNOSIS — Z89612 Acquired absence of left leg above knee: Secondary | ICD-10-CM | POA: Insufficient documentation

## 2014-06-07 DIAGNOSIS — M24652 Ankylosis, left hip: Secondary | ICD-10-CM | POA: Diagnosis not present

## 2014-06-07 LAB — PRESCRIPTION MONITORING PROFILE (SOLSTAS)
AMPHETAMINE/METH: NEGATIVE ng/mL
Barbiturate Screen, Urine: NEGATIVE ng/mL
Benzodiazepine Screen, Urine: NEGATIVE ng/mL
Buprenorphine, Urine: NEGATIVE ng/mL
CANNABINOID SCRN UR: NEGATIVE ng/mL
CARISOPRODOL, URINE: NEGATIVE ng/mL
COCAINE METABOLITES: NEGATIVE ng/mL
CREATININE, URINE: 56.72 mg/dL (ref >20.0)
Fentanyl, Ur: NEGATIVE ng/mL
MDMA URINE: NEGATIVE ng/mL
MEPERIDINE UR: NEGATIVE ng/mL
Methadone Screen, Urine: NEGATIVE ng/mL
Nitrites, Initial: NEGATIVE ug/mL
OXYCODONE SCRN UR: NEGATIVE ng/mL
Opiate Screen, Urine: NEGATIVE ng/mL
Propoxyphene: NEGATIVE ng/mL
TAPENTADOLUR: NEGATIVE ng/mL
Tramadol Scrn, Ur: NEGATIVE ng/mL
Zolpidem, Urine: NEGATIVE ng/mL
pH, Initial: 5 pH (ref 4.5–8.9)

## 2014-06-07 NOTE — Therapy (Signed)
[place] [facility] [address] [place], [place], [address] Phone: [phone]   Fax:  [phone]  Physical Therapy Treatment  Patient Details  Name: [name] MRN: [id number] Date of Birth: 1934/07/24 Referring Provider:  Laurey Morale, MD  Encounter Date: 06/07/2014      PT End of Session - 06/07/14 0940    Visit Number 6   Number of Visits 17   Date for PT Re-Evaluation 07/15/04   PT Start Time 0933   PT Stop Time 1015   PT Time Calculation (min) 42 min   Equipment Utilized During Treatment Gait belt   Activity Tolerance Patient limited by fatigue   Behavior During Therapy [facility] for tasks assessed/performed      Past Medical History  Diagnosis Date  . Gout   . Benign prostatic hypertrophy     takes Proscar daily  . Atrial fibrillation     takes Warfarin daily  . Chronic systolic dysfunction of left ventricle     a. mixed ischemic and nonischemic CM,  EF 35%. b. s/p AICD implantation.  . ED (erectile dysfunction)   . Arthritis   . Pacemaker     medtronic  . CAD (coronary artery disease)     a. s/p mid LAD stenting with DES 2008 Dr. Daneen Schick  . ICD (implantable cardiac defibrillator) in place     medtronic, Dr. Rayann Heman  . ICD (implantable cardiac defibrillator) in place   . Sleep apnea     hx of "had surgery for"  . Automatic implantable cardioverter-defibrillator in situ   . Depression   . Numbness and tingling     Hx; 89f left foot  . PAD (peripheral artery disease)     Severe by PV angiogram 09/2011  . Renal artery stenosis     s/p stenting 2009  . PAD (peripheral artery disease)     s/p multiple LLE bypass grafts; left mid-distal SCA occlusion by 08/2012 duplex  . Hypertension     takes Carvedilol and Losartan daily  . Constipation     takes Miralax daily as needed and Colace daily   . Type II diabetes mellitus     takes Novolog 70/30  . CHF (congestive heart failure)     takes Lasix daily   . History of MRSA infection   . Insomnia     TAKES TRAZODONE NIGHTLY    Past Surgical History  Procedure Laterality Date  . Turp vaporization    . Cardiac defibrillator placement  12/26/09    pacemaker combo  . Cervical epidural injection  2013  . Femoral-tibial bypass graft  09/25/2011    Procedure: BYPASS GRAFT FEMORAL-TIBIAL ARTERY;  Surgeon: Mal Misty, MD;  Location: [facility] OR;  Service: Vascular;  Laterality: Left;  Left Femoral - Anterior Tibial Bypass;  saphenous vein graft from left leg  . Intraoperative arteriogram  09/25/2011    Procedure: INTRA OPERATIVE ARTERIOGRAM;  Surgeon: Mal Misty, MD;  Location: [place];  Service: Vascular;  Laterality: Left;  . Femoral-tibial bypass graft  02/07/2012    Procedure: BYPASS GRAFT FEMORAL-TIBIAL ARTERY;  Surgeon: Rosetta Posner, MD;  Location: [place];  Service: Vascular;  Laterality: Left;  Thrombectomy Left Femoral - Tibial Bypass Graft  . Embolectomy  02/07/2012    Procedure: EMBOLECTOMY;  Surgeon: Rosetta Posner, MD;  Location: [place];  Service: Vascular;  Laterality: Left;  . Femoral-tibial bypass graft  04/03/2012    Procedure: BYPASS GRAFT FEMORAL-TIBIAL ARTERY;  Surgeon: Mal Misty, MD;  Location: [place];  Service: Vascular;  Laterality: Left;  Redo  . Insert / replace / remove pacemaker  2007  . Coronary angioplasty with stent placement  ~ 2000  . Coronary angioplasty    . Uvulopalatopharyngoplasty (uppp)/tonsillectomy/septoplasty  06/30/2003    Archie Endo 06/30/2003 (07/10/2012)  . Shoulder open rotator cuff repair Right 2001    repair of lacerated right/notes 10/11/1999  (07/10/2012)  . Foot surgery Right 03/20/2001    "have plates and screws in; didn't break it" (07/10/2012)  . Carpal tunnel release Right 2002    Archie Endo 03/20/2001 (07/10/2012)  . Biceps tendon repair Right 2001    Archie Endo 03/20/2001 (07/10/2012)  . Renal artery stent  2009  . Femoral-popliteal bypass graft Left 09/16/2012    Procedure: LEFT FEMORAL-POPLITEAL BYPASS GRAFT WITH  GORTEX Propaten Graft 6x80 Thin Wall and Left lower leg Angiogram;  Surgeon: Mal Misty, MD;  Location: [place];  Service: Vascular;  Laterality: Left;  . Colonoscopy      Hx; of  . Tonsillectomy    . Adenoidectomy      Hx; of   . Femoral-tibial bypass graft Left 09/30/2012    Procedure: REDO LEFT FEMORAL-ANTERIOR TIBIAL ARTERY BYPASS USING COMPOSITE CEPHALIC AND BASILIC VEIN GRAFT FROM LEFT ARM;  Surgeon: Mal Misty, MD;  Location: [place];  Service: Vascular;  Laterality: Left;  . I&d extremity Left 10/21/2012    Procedure: EXPLORATION AND DEBRIDEMENT OF LEFT GROIN WOUND;  Surgeon: Mal Misty, MD;  Location: [place];  Service: Vascular;  Laterality: Left;  . Patch angioplasty Left 10/21/2012    Procedure: PATCH ANGIOPLASTY;  Surgeon: Mal Misty, MD;  Location: [place];  Service: Vascular;  Laterality: Left;  . Groin debridement Left 11/12/2012    Procedure: CLOSURE INGUINAL WOUND;  Surgeon: Mal Misty, MD;  Location: [place];  Service: Vascular;  Laterality: Left;  . Embolectomy Left 12/16/2012    Procedure: THROMBECTOMY  LEFT LEG BYPASS;  Surgeon: Elam Dutch, MD;  Location: [place];  Service: Vascular;  Laterality: Left;  . Leg amputation above knee Left 02/04/2013    DR LAWSON  . Amputation Left 02/04/2013    Procedure: AMPUTATION ABOVE KNEE-LEFT;  Surgeon: Mal Misty, MD;  Location: [place];  Service: Vascular;  Laterality: Left;  . Removal of graft Left 04/16/2013    Procedure: I & D LEFT AKA WOUND, POSSIBLE REMOVAL OF INFECTED GORTEX GRAFT;  Surgeon: Mal Misty, MD;  Location: [place];  Service: Vascular;  Laterality: Left;  . Abdominal aortagram N/A 09/11/2011    Procedure: ABDOMINAL Maxcine Ham;  Surgeon: Wellington Hampshire, MD;  Location: [facility] CATH LAB;  Service: Cardiovascular;  Laterality: N/A;  . Lower extremity angiogram Left 12/25/2011    Procedure: LOWER EXTREMITY ANGIOGRAM;  Surgeon: Serafina Mitchell, MD;  Location: [facility] CATH LAB;  Service: Cardiovascular;  Laterality:  Left;  lt leg angio co2  . Abdominal angiogram  12/25/2011    Procedure: ABDOMINAL ANGIOGRAM;  Surgeon: Serafina Mitchell, MD;  Location: [facility] CATH LAB;  Service: Cardiovascular;;  . Lower extremity angiogram N/A 07/07/2012    Procedure: LOWER EXTREMITY ANGIOGRAM;  Surgeon: Conrad , MD;  Location: [facility] CATH LAB;  Service: Cardiovascular;  Laterality: N/A;  . Lower extremity angiogram N/A 09/15/2012    Procedure: LOWER EXTREMITY ANGIOGRAM;  Surgeon: Serafina Mitchell, MD;  Location: [facility] CATH LAB;  Service: Cardiovascular;  Laterality: N/A;  . Upper extremity angiogram  09/15/2012  Procedure: UPPER EXTREMITY ANGIOGRAM;  Surgeon: Serafina Mitchell, MD;  Location: [facility] CATH LAB;  Service: Cardiovascular;;  . Biv icd genertaor change out N/A 11/09/2013    Procedure: BIV ICD GENERTAOR CHANGE OUT;  Surgeon: Coralyn Mark, MD;  Location: [facility] CATH LAB;  Service: Cardiovascular;  Laterality: N/A;    There were no vitals filed for this visit.  Visit Diagnosis:  Abnormality of gait  Impaired functional mobility and activity tolerance      Subjective Assessment - 06/07/14 0936    Subjective Saw Dr Naaman Plummer, on predisone for hand pain (started on Friday). Feeling better. Now having a lot of phantom pain. Saw Merry Proud this am to have prosthesis adjusted due to it kept falling off.  Reports he also made adjustements to the knee and foot position as well.                                    Currently in Pain? Yes   Pain Score 6    Pain Location Leg   Pain Orientation Left   Pain Descriptors / Indicators Sore;Aching   Pain Type Phantom pain;Intractable pain   Pain Onset More than a month ago   Pain Frequency Constant   Aggravating Factors  wearing prosthesis   Pain Relieving Factors removing prosthesis, medication          [facility] Adult PT Treatment/Exercise - 06/07/14 0941    Transfers   Sit to Stand 5: Supervision;With upper extremity assist;With armrests;From chair/3-in-1   Sit to Stand Details (indicate cue  type and reason) cues for prosthetic knee use   Stand to Sit 5: Supervision;With upper extremity assist;With armrests;To chair/3-in-1   Stand to Sit Details cues to engage prosthetic knee   Ambulation/Gait   Ambulation/Gait Yes   Ambulation/Gait Assistance 5: Supervision;4: Min guard;4: Min assist   Ambulation/Gait Assistance Details cues to increase right step length, left stance time and for posture and walker movement with gait. one episode of knee buckling in stanceon left leg needing min assist for stability/recovery.                    Ambulation Distance (Feet) 220 Feet  x2 reps; 120 x 1 rep   Assistive device Rolling walker;Prosthesis   Gait Pattern Step-through pattern;Decreased stride length;Decreased stance time - left;Decreased step length - right;Poor foot clearance - left;Trunk flexed;Decreased trunk rotation   Ambulation Surface Level;Indoor   Dynamic Standing Balance   Dynamic Standing - Balance Support Bilateral upper extremity supported;During functional activity   Dynamic Standing - Level of Assistance 4: Min assist;3: Mod assist   Dynamic Standing - Comments in parallel bars: left stance with right leg stepping over 1/2 foam roll and back, emphasis on prosthetic knee use/control x 10 reps; left stance with right toe taps to 4 inch box x 10 reps with emphasis on left weight shift and posture. facilitation needed to shift weight onto prosthesis.                               Prosthetics   Current prosthetic wear tolerance (days/week)  daily   Current prosthetic wear tolerance (#hours/day)  4 hours 2 x day  drying as needed   Residual limb condition  intact per pt report   Education Provided Residual limb care  drying more as weather get warmer   Person(s)  Educated Patient   Education Method Explanation   Education Method Verbalized understanding   Donning Prosthesis Modified independent (device/increased time)   Doffing Prosthesis Modified independent (device/increased  time)           PT Short Term Goals - 05/18/14 0845    PT SHORT TERM GOAL #1   Title donnes prosthesis with new suspension correctly independently. (Target Date: 06/17/14)   Time 4   Period Weeks   Status New   PT SHORT TERM GOAL #2   Title demonstrates understanding of initial HEP. (Target Date: 06/17/14)   Time 4   Period Weeks   Status New   PT SHORT TERM GOAL #3   Title wears prosthesis >70% of awake hours >/= 5 days /wk. (Target Date: 06/17/14)   Time 4   Period Weeks   Status New   PT SHORT TERM GOAL #4   Title ambulates 100' with rolling walker & prosthesis with cues only for deviations. (Target Date: 06/17/14)   Time 4   Period Weeks   Status New   PT SHORT TERM GOAL #5   Title stands 30sec without UE support with supervision. (Target Date: 06/17/14)   Time 4   Period Weeks   Status New           PT Long Term Goals - 05/18/14 0845    PT LONG TERM GOAL #1   Title wears prosthesis >80% of awake hours daily. (Target Date: 07/15/14)   Time 8   Period Weeks   Status New   PT LONG TERM GOAL #2   Title verbalize understanding of ongoing fitness plan. (Target Date: 07/15/14)   Time 8   Period Weeks   Status New   PT LONG TERM GOAL #3   Title ambulates 200' with rolling walker & prosthesis modified independent. (Target Date: 07/15/14)   Time 8   Period Weeks   Status New   PT LONG TERM GOAL #4   Title Berg Balance >18/56 (Target Date: 07/15/14)   Time 8   Period Weeks   Status New   PT LONG TERM GOAL #5   Title negotiate ramp, curb & stairs (1 rail) with rolling walker & prosthesis modified independent. (Target Date: 07/15/14)   Time 8   Period Weeks   Status New           Plan - 06/07/14 0940    Clinical Impression Statement Pt still needs cues and assist to fully weight shift onto prosthesis and engage prosthetic knee with mobility. Progressing toward STG's.   Pt will benefit from skilled therapeutic intervention in order to improve on the following  deficits Abnormal gait;Cardiopulmonary status limiting activity;Decreased activity tolerance;Decreased balance;Decreased endurance;Decreased knowledge of use of DME;Decreased mobility;Decreased range of motion;Decreased strength;Pain;Other (comment)  prosthetic dependency   Rehab Potential Good   PT Frequency 2x / week   PT Duration 8 weeks   PT Treatment/Interventions ADLs/Self Care Home Management;DME Instruction;Gait training;Stair training;Functional mobility training;Therapeutic activities;Therapeutic exercise;Balance training;Neuromuscular re-education;Patient/family education;Other (comment)  prosthetic training   PT Next Visit Plan strength including core, endurance, Prosthetic training in gait, barriers with walker   Consulted and Agree with Plan of Care Patient        Problem List Patient Active Problem List   Diagnosis Date Noted  . OAB (overactive bladder) 02/07/2014  . Wound disruption, post-op, skin 05/28/2013  . Encounter for therapeutic drug monitoring 04/22/2013  . Wound infection 04/16/2013  . Chronic infection of amputation stump 04/13/2013  . Phantom  limb pain 03/24/2013  . Wound drainage 03/23/2013  . Encounter for staple removal 03/09/2013  . Acute blood loss anemia 02/10/2013  . Chronic kidney disease 02/10/2013  . Unilateral AKA 02/09/2013  . Nontraumatic ischemic infarction of muscle of lower leg 02/04/2013  . Long term (current) use of anticoagulants 02/01/2013  . Pain, limb, left-Leg 01/21/2013  . Aftercare following surgery of the circulatory system, [place] 01/12/2013  . PVD (peripheral vascular disease) 12/22/2012  . Swelling of limb 10/07/2012  . Subclavian artery stenosis 09/08/2012  . Peripheral vascular disease 08/18/2012  . Fever 07/08/2012  . Leg edema 06/02/2012  . Cardiomyopathy, ischemic 05/18/2012  . S/P ICD (internal cardiac defibrillator) procedure 05/18/2012  . Diabetes 05/18/2012  . Pain in limb 02/07/2012  . Atherosclerosis of native  arteries of the extremities with intermittent claudication 12/17/2011  . Atherosclerotic PVD with intermittent claudication 10/15/2011  . Type I (juvenile type) diabetes mellitus with peripheral circulatory disorders, uncontrolled 09/06/2011  . PAD (peripheral artery disease) 09/04/2011  . CAD (coronary artery disease)   . Burning sensation of feet 05/10/2011  . Bursitis of right hip 05/10/2011  . Cervicalgia 05/10/2011  . Balance disorder 05/10/2011  . Neurogenic claudication 05/10/2011  . Lumbago 05/10/2011  . Chronic systolic congestive heart failure 07/06/2010  . ECZEMA 01/17/2010  . RENAL INSUFFICIENCY 11/01/2009  . UTI 11/01/2009  . CELLULITIS, LEG, RIGHT 08/22/2009  . LYMPHADENOPATHY, REACTIVE 08/22/2009  . PERIPHERAL NEUROPATHY 01/24/2009  . DIZZINESS 12/29/2008  . ABDOMINAL PAIN, GENERALIZED 08/22/2008  . Gout 09/18/2007  . Coronary atherosclerosis 09/18/2007  . HYPERLIPIDEMIA 01/23/2007  . Essential hypertension 01/23/2007  . Atrial fibrillation 01/23/2007  . BENIGN PROSTATIC HYPERTROPHY 01/23/2007  . LATERAL EPICONDYLITIS 01/23/2007    Willow Ora 06/07/2014, 12:04 PM  Willow Ora, PTA, [place] [address] [place], [place] [address] [phone] 06/07/2014, 12:04 PM

## 2014-06-09 ENCOUNTER — Telehealth: Payer: Self-pay | Admitting: Family Medicine

## 2014-06-09 ENCOUNTER — Encounter: Payer: Self-pay | Admitting: Physical Therapy

## 2014-06-09 ENCOUNTER — Encounter: Payer: Self-pay | Admitting: Cardiology

## 2014-06-09 ENCOUNTER — Ambulatory Visit: Payer: Commercial Managed Care - HMO | Admitting: Physical Therapy

## 2014-06-09 DIAGNOSIS — R2689 Other abnormalities of gait and mobility: Secondary | ICD-10-CM

## 2014-06-09 DIAGNOSIS — Z89612 Acquired absence of left leg above knee: Secondary | ICD-10-CM

## 2014-06-09 DIAGNOSIS — R531 Weakness: Secondary | ICD-10-CM

## 2014-06-09 DIAGNOSIS — R269 Unspecified abnormalities of gait and mobility: Secondary | ICD-10-CM

## 2014-06-09 DIAGNOSIS — Z7409 Other reduced mobility: Secondary | ICD-10-CM

## 2014-06-09 NOTE — Telephone Encounter (Signed)
Per Dr. Sarajane Jews pt should continue with current dose of Novolog mix and drink plenty of water. I spoke with pt and went over this information.

## 2014-06-09 NOTE — Telephone Encounter (Signed)
Pt called to say that his sugar is usually under control. Pt said he is taking prednisone and is asking if this will make his sugar up. Would like a call back

## 2014-06-09 NOTE — Therapy (Signed)
[facility] [address] [place], [place], [address] Phone: [phone]   Fax:  [phone]  Physical Therapy Treatment  Patient Details  Name: [name] MRN: [id number] Date of Birth: [birth year] Referring Provider:  Laurey Morale, MD  Encounter Date: 06/09/2014      PT End of Session - 06/09/14 0930    Visit Number 7   Number of Visits 17   Date for PT Re-Evaluation 07/15/04   PT Start Time 0930   PT Stop Time 1010   PT Time Calculation (min) 40 min   Equipment Utilized During Treatment Gait belt   Activity Tolerance Patient limited by fatigue   Behavior During Therapy [facility] for tasks assessed/performed      Past Medical History  Diagnosis Date  . Gout   . Benign prostatic hypertrophy     takes Proscar daily  . Atrial fibrillation     takes Warfarin daily  . Chronic systolic dysfunction of left ventricle     a. mixed ischemic and nonischemic CM,  EF 35%. b. s/p AICD implantation.  . ED (erectile dysfunction)   . Arthritis   . Pacemaker     medtronic  . CAD (coronary artery disease)     a. s/p mid LAD stenting with DES 2008 Dr. Daneen Schick  . ICD (implantable cardiac defibrillator) in place     medtronic, Dr. Rayann Heman  . ICD (implantable cardiac defibrillator) in place   . Sleep apnea     hx of "had surgery for"  . Automatic implantable cardioverter-defibrillator in situ   . Depression   . Numbness and tingling     Hx; 28f left foot  . PAD (peripheral artery disease)     Severe by PV angiogram 09/2011  . Renal artery stenosis     s/p stenting 2009  . PAD (peripheral artery disease)     s/p multiple LLE bypass grafts; left mid-distal SCA occlusion by 08/2012 duplex  . Hypertension     takes Carvedilol and Losartan daily  . Constipation     takes Miralax daily as needed and Colace daily   . Type II diabetes mellitus     takes Novolog 70/30  . CHF (congestive heart failure)     takes Lasix daily   . History of MRSA infection   . Insomnia     TAKES TRAZODONE NIGHTLY    Past Surgical History  Procedure Laterality Date  . Turp vaporization    . Cardiac defibrillator placement  12/26/09    pacemaker combo  . Cervical epidural injection  2013  . Femoral-tibial bypass graft  09/25/2011    Procedure: BYPASS GRAFT FEMORAL-TIBIAL ARTERY;  Surgeon: Mal Misty, MD;  Location: [facility] OR;  Service: Vascular;  Laterality: Left;  Left Femoral - Anterior Tibial Bypass;  saphenous vein graft from left leg  . Intraoperative arteriogram  09/25/2011    Procedure: INTRA OPERATIVE ARTERIOGRAM;  Surgeon: Mal Misty, MD;  Location: [place];  Service: Vascular;  Laterality: Left;  . Femoral-tibial bypass graft  02/07/2012    Procedure: BYPASS GRAFT FEMORAL-TIBIAL ARTERY;  Surgeon: Rosetta Posner, MD;  Location: [place];  Service: Vascular;  Laterality: Left;  Thrombectomy Left Femoral - Tibial Bypass Graft  . Embolectomy  02/07/2012    Procedure: EMBOLECTOMY;  Surgeon: Rosetta Posner, MD;  Location: [place];  Service: Vascular;  Laterality: Left;  . Femoral-tibial bypass graft  04/03/2012    Procedure: BYPASS GRAFT FEMORAL-TIBIAL ARTERY;  Surgeon: Mal Misty, MD;  Location: [place];  Service: Vascular;  Laterality: Left;  Redo  . Insert / replace / remove pacemaker  2007  . Coronary angioplasty with stent placement  ~ 2000  . Coronary angioplasty    . Uvulopalatopharyngoplasty (uppp)/tonsillectomy/septoplasty  06/30/2003    Archie Endo 06/30/2003 (07/10/2012)  . Shoulder open rotator cuff repair Right 2001    repair of lacerated right/notes 10/11/1999  (07/10/2012)  . Foot surgery Right 03/20/2001    "have plates and screws in; didn't break it" (07/10/2012)  . Carpal tunnel release Right 2002    Archie Endo 03/20/2001 (07/10/2012)  . Biceps tendon repair Right 2001    Archie Endo 03/20/2001 (07/10/2012)  . Renal artery stent  2009  . Femoral-popliteal bypass graft Left 09/16/2012    Procedure: LEFT FEMORAL-POPLITEAL BYPASS GRAFT WITH  GORTEX Propaten Graft 6x80 Thin Wall and Left lower leg Angiogram;  Surgeon: Mal Misty, MD;  Location: [facility];  Service: Vascular;  Laterality: Left;  . Colonoscopy      Hx; of  . Tonsillectomy    . Adenoidectomy      Hx; of   . Femoral-tibial bypass graft Left 09/30/2012    Procedure: REDO LEFT FEMORAL-ANTERIOR TIBIAL ARTERY BYPASS USING COMPOSITE CEPHALIC AND BASILIC VEIN GRAFT FROM LEFT ARM;  Surgeon: Mal Misty, MD;  Location: [facility];  Service: Vascular;  Laterality: Left;  . I&d extremity Left 10/21/2012    Procedure: EXPLORATION AND DEBRIDEMENT OF LEFT GROIN WOUND;  Surgeon: Mal Misty, MD;  Location: [facility];  Service: Vascular;  Laterality: Left;  . Patch angioplasty Left 10/21/2012    Procedure: PATCH ANGIOPLASTY;  Surgeon: Mal Misty, MD;  Location: [place];  Service: Vascular;  Laterality: Left;  . Groin debridement Left 11/12/2012    Procedure: CLOSURE INGUINAL WOUND;  Surgeon: Mal Misty, MD;  Location: [place];  Service: Vascular;  Laterality: Left;  . Embolectomy Left 12/16/2012    Procedure: THROMBECTOMY  LEFT LEG BYPASS;  Surgeon: Elam Dutch, MD;  Location: [place];  Service: Vascular;  Laterality: Left;  . Leg amputation above knee Left 02/04/2013    DR LAWSON  . Amputation Left 02/04/2013    Procedure: AMPUTATION ABOVE KNEE-LEFT;  Surgeon: Mal Misty, MD;  Location: [facility];  Service: Vascular;  Laterality: Left;  . Removal of graft Left 04/16/2013    Procedure: I & D LEFT AKA WOUND, POSSIBLE REMOVAL OF INFECTED GORTEX GRAFT;  Surgeon: Mal Misty, MD;  Location: [place];  Service: Vascular;  Laterality: Left;  . Abdominal aortagram N/A 09/11/2011    Procedure: ABDOMINAL Maxcine Ham;  Surgeon: Wellington Hampshire, MD;  Location: [facility] CATH LAB;  Service: Cardiovascular;  Laterality: N/A;  . Lower extremity angiogram Left 12/25/2011    Procedure: LOWER EXTREMITY ANGIOGRAM;  Surgeon: Serafina Mitchell, MD;  Location: [facility] CATH LAB;  Service: Cardiovascular;  Laterality:  Left;  lt leg angio co2  . Abdominal angiogram  12/25/2011    Procedure: ABDOMINAL ANGIOGRAM;  Surgeon: Serafina Mitchell, MD;  Location: [facility] CATH LAB;  Service: Cardiovascular;;  . Lower extremity angiogram N/A 07/07/2012    Procedure: LOWER EXTREMITY ANGIOGRAM;  Surgeon: Conrad Pierpoint, MD;  Location: [facility] CATH LAB;  Service: Cardiovascular;  Laterality: N/A;  . Lower extremity angiogram N/A 09/15/2012    Procedure: LOWER EXTREMITY ANGIOGRAM;  Surgeon: Serafina Mitchell, MD;  Location: [facility] CATH LAB;  Service: Cardiovascular;  Laterality: N/A;  . Upper extremity angiogram  09/15/2012  Procedure: UPPER EXTREMITY ANGIOGRAM;  Surgeon: Serafina Mitchell, MD;  Location: [facility] CATH LAB;  Service: Cardiovascular;;  . Biv icd genertaor change out N/A 11/09/2013    Procedure: BIV ICD GENERTAOR CHANGE OUT;  Surgeon: Coralyn Mark, MD;  Location: [facility] CATH LAB;  Service: Cardiovascular;  Laterality: N/A;    There were no vitals filed for this visit.  Visit Diagnosis:  Abnormality of gait  Impaired functional mobility and activity tolerance  Weakness generalized  Status post above knee amputation of left lower extremity  Balance problems      Subjective Assessment - 06/09/14 0936    Subjective Still issues with prosthesis coming off.    Currently in Pain? Yes   Pain Score 5    Pain Location Leg   Pain Orientation Left   Pain Descriptors / Indicators Sore;Aching   Pain Type Phantom pain   Pain Onset More than a month ago                       [facility] Adult PT Treatment/Exercise - 06/09/14 0930    Transfers   Sit to Stand 5: Supervision;With upper extremity assist;With armrests;From chair/3-in-1  to RW for stabilization   Sit to Stand Details (indicate cue type and reason) cues on knee control   Stand to Sit 5: Supervision;With upper extremity assist;With armrests;To chair/3-in-1  to RW for stabilization   Stand to Sit Details cues on using prosthesis to position / turn   Stand Pivot  Transfers 5: Supervision;With armrests  with prosthesis & RW   Stand Pivot Transfer Details (indicate cue type and reason) cues on weighting prosthesis in stance   Ambulation/Gait   Ambulation/Gait Yes   Ambulation/Gait Assistance 5: Supervision   Ambulation/Gait Assistance Details verbal & manual cues on wt shift in stance and right step length, initial contact without excessive heel distance from floor   Ambulation Distance (Feet) 220 Feet  x 3 reps   Assistive device Rolling walker;Prosthesis   Gait Pattern Step-through pattern;Decreased stride length;Decreased stance time - left;Decreased step length - right;Poor foot clearance - left;Trunk flexed;Decreased trunk rotation   Ambulation Surface Indoor;Level   Ramp 4: Min assist  RW & Prosthesis   Ramp Details (indicate cue type and reason) verbal cues on technique   Curb 4: Min assist  RW & prosthesis   Curb Details (indicate cue type and reason) verbal cues on technique   Dynamic Standing Balance   Dynamic Standing - Balance Support Bilateral upper extremity supported;During functional activity   Dynamic Standing - Level of Assistance 4: Min assist   Dynamic Standing - Balance Activities Reaching for objects   Prosthetics   Prosthetic Care Comments  donning suction ring suspension   Current prosthetic wear tolerance (days/week)  daily   Current prosthetic wear tolerance (#hours/day)  4 hours 2 x day, PT instructed to increase to 5 hrs 2x/day  drying as needed   Residual limb condition  intact per pt report   Education Provided Proper wear schedule/adjustment;Other (comment)  donning suction ring suspension   Person(s) Educated Patient   Education Method Explanation;Demonstration;Tactile cues;Verbal cues   Education Method Verbalized understanding;Returned demonstration;Tactile cues required;Verbal cues required;Needs further instruction   Donning Prosthesis Supervision   Doffing Prosthesis Modified independent (device/increased  time)                PT Education - 06/09/14 1015    Education provided Yes   Education Details see prosthetic care   Person(s)  Educated Patient   Methods Explanation;Demonstration;Tactile cues;Verbal cues   Comprehension Verbalized understanding;Returned demonstration;Verbal cues required;Tactile cues required;Need further instruction          PT Short Term Goals - 05/18/14 0845    PT SHORT TERM GOAL #1   Title donnes prosthesis with new suspension correctly independently. (Target Date: 06/17/14)   Time 4   Period Weeks   Status New   PT SHORT TERM GOAL #2   Title demonstrates understanding of initial HEP. (Target Date: 06/17/14)   Time 4   Period Weeks   Status New   PT SHORT TERM GOAL #3   Title wears prosthesis >70% of awake hours >/= 5 days /wk. (Target Date: 06/17/14)   Time 4   Period Weeks   Status New   PT SHORT TERM GOAL #4   Title ambulates 100' with rolling walker & prosthesis with cues only for deviations. (Target Date: 06/17/14)   Time 4   Period Weeks   Status New   PT SHORT TERM GOAL #5   Title stands 30sec without UE support with supervision. (Target Date: 06/17/14)   Time 4   Period Weeks   Status New           PT Long Term Goals - 05/18/14 0845    PT LONG TERM GOAL #1   Title wears prosthesis >80% of awake hours daily. (Target Date: 07/15/14)   Time 8   Period Weeks   Status New   PT LONG TERM GOAL #2   Title verbalize understanding of ongoing fitness plan. (Target Date: 07/15/14)   Time 8   Period Weeks   Status New   PT LONG TERM GOAL #3   Title ambulates 200' with rolling walker & prosthesis modified independent. (Target Date: 07/15/14)   Time 8   Period Weeks   Status New   PT LONG TERM GOAL #4   Title Berg Balance >18/56 (Target Date: 07/15/14)   Time 8   Period Weeks   Status New   PT LONG TERM GOAL #5   Title negotiate ramp, curb & stairs (1 rail) with rolling walker & prosthesis modified independent. (Target Date: 07/15/14)    Time 8   Period Weeks   Status New               Plan - 06/09/14 0930    Clinical Impression Statement Patient appears to understand donning suction ring suspension better. Patient reports less energy required when he walks the way PT has instructed.    Pt will benefit from skilled therapeutic intervention in order to improve on the following deficits Abnormal gait;Cardiopulmonary status limiting activity;Decreased activity tolerance;Decreased balance;Decreased endurance;Decreased knowledge of use of DME;Decreased mobility;Decreased range of motion;Decreased strength;Pain;Other (comment)  prosthetic dependency   Rehab Potential Good   PT Frequency 2x / week   PT Duration 8 weeks   PT Treatment/Interventions ADLs/Self Care Home Management;DME Instruction;Gait training;Stair training;Functional mobility training;Therapeutic activities;Therapeutic exercise;Balance training;Neuromuscular re-education;Patient/family education;Other (comment)  prosthetic training   PT Next Visit Plan check STGs, strength including core, endurance, Prosthetic training in gait, barriers with walker   Consulted and Agree with Plan of Care Patient        Problem List Patient Active Problem List   Diagnosis Date Noted  . OAB (overactive bladder) 02/07/2014  . Wound disruption, post-op, skin 05/28/2013  . Encounter for therapeutic drug monitoring 04/22/2013  . Wound infection 04/16/2013  . Chronic infection of amputation stump 04/13/2013  . Phantom limb pain 03/24/2013  .  Wound drainage 03/23/2013  . Encounter for staple removal 03/09/2013  . Acute blood loss anemia 02/10/2013  . Chronic kidney disease 02/10/2013  . Unilateral AKA 02/09/2013  . Nontraumatic ischemic infarction of muscle of lower leg 02/04/2013  . Long term (current) use of anticoagulants 02/01/2013  . Pain, limb, left-Leg 01/21/2013  . Aftercare following surgery of the circulatory system, [place] 01/12/2013  . PVD (peripheral  vascular disease) 12/22/2012  . Swelling of limb 10/07/2012  . Subclavian artery stenosis 09/08/2012  . Peripheral vascular disease 08/18/2012  . Fever 07/08/2012  . Leg edema 06/02/2012  . Cardiomyopathy, ischemic 05/18/2012  . S/P ICD (internal cardiac defibrillator) procedure 05/18/2012  . Diabetes 05/18/2012  . Pain in limb 02/07/2012  . Atherosclerosis of native arteries of the extremities with intermittent claudication 12/17/2011  . Atherosclerotic PVD with intermittent claudication 10/15/2011  . Type I (juvenile type) diabetes mellitus with peripheral circulatory disorders, uncontrolled 09/06/2011  . PAD (peripheral artery disease) 09/04/2011  . CAD (coronary artery disease)   . Burning sensation of feet 05/10/2011  . Bursitis of right hip 05/10/2011  . Cervicalgia 05/10/2011  . Balance disorder 05/10/2011  . Neurogenic claudication 05/10/2011  . Lumbago 05/10/2011  . Chronic systolic congestive heart failure 07/06/2010  . ECZEMA 01/17/2010  . RENAL INSUFFICIENCY 11/01/2009  . UTI 11/01/2009  . CELLULITIS, LEG, RIGHT 08/22/2009  . LYMPHADENOPATHY, REACTIVE 08/22/2009  . PERIPHERAL NEUROPATHY 01/24/2009  . DIZZINESS 12/29/2008  . ABDOMINAL PAIN, GENERALIZED 08/22/2008  . Gout 09/18/2007  . Coronary atherosclerosis 09/18/2007  . HYPERLIPIDEMIA 01/23/2007  . Essential hypertension 01/23/2007  . Atrial fibrillation 01/23/2007  . BENIGN PROSTATIC HYPERTROPHY 01/23/2007  . LATERAL EPICONDYLITIS 01/23/2007    Juliauna Stueve PT, DPT 06/09/2014, 12:59 PM  [facility] [address] [place], [place], [address] Phone: [phone]   Fax:  [phone]

## 2014-06-09 NOTE — Telephone Encounter (Signed)
I spoke with pt and his glucose reading this morning was 284 fasting. He is usually at 115-150 with reading. He only has 1 more day of prednisone.

## 2014-06-13 ENCOUNTER — Encounter: Payer: Self-pay | Admitting: Internal Medicine

## 2014-06-15 ENCOUNTER — Ambulatory Visit: Payer: Commercial Managed Care - HMO | Admitting: Physical Therapy

## 2014-06-15 ENCOUNTER — Emergency Department (HOSPITAL_COMMUNITY): Payer: Commercial Managed Care - HMO

## 2014-06-15 ENCOUNTER — Inpatient Hospital Stay (HOSPITAL_COMMUNITY)
Admission: EM | Admit: 2014-06-15 | Discharge: 2014-06-16 | DRG: 091 | Disposition: A | Discharge status: Home / Self Care | Payer: Commercial Managed Care - HMO | Attending: Internal Medicine | Admitting: Internal Medicine

## 2014-06-15 ENCOUNTER — Encounter: Payer: Self-pay | Admitting: Physical Therapy

## 2014-06-15 ENCOUNTER — Encounter (HOSPITAL_COMMUNITY): Payer: Self-pay | Admitting: Emergency Medicine

## 2014-06-15 ENCOUNTER — Telehealth: Payer: Self-pay | Admitting: Family Medicine

## 2014-06-15 DIAGNOSIS — R4701 Aphasia: Principal | ICD-10-CM | POA: Diagnosis present

## 2014-06-15 DIAGNOSIS — N400 Enlarged prostate without lower urinary tract symptoms: Secondary | ICD-10-CM | POA: Diagnosis not present

## 2014-06-15 DIAGNOSIS — G4700 Insomnia, unspecified: Secondary | ICD-10-CM | POA: Diagnosis present

## 2014-06-15 DIAGNOSIS — F17210 Nicotine dependence, cigarettes, uncomplicated: Secondary | ICD-10-CM | POA: Diagnosis present

## 2014-06-15 DIAGNOSIS — G9340 Encephalopathy, unspecified: Secondary | ICD-10-CM | POA: Diagnosis present

## 2014-06-15 DIAGNOSIS — D696 Thrombocytopenia, unspecified: Secondary | ICD-10-CM | POA: Diagnosis present

## 2014-06-15 DIAGNOSIS — E1165 Type 2 diabetes mellitus with hyperglycemia: Secondary | ICD-10-CM | POA: Diagnosis present

## 2014-06-15 DIAGNOSIS — M109 Gout, unspecified: Secondary | ICD-10-CM | POA: Diagnosis present

## 2014-06-15 DIAGNOSIS — E785 Hyperlipidemia, unspecified: Secondary | ICD-10-CM | POA: Diagnosis present

## 2014-06-15 DIAGNOSIS — I739 Peripheral vascular disease, unspecified: Secondary | ICD-10-CM | POA: Diagnosis present

## 2014-06-15 DIAGNOSIS — M1990 Unspecified osteoarthritis, unspecified site: Secondary | ICD-10-CM | POA: Diagnosis present

## 2014-06-15 DIAGNOSIS — I482 Chronic atrial fibrillation, unspecified: Secondary | ICD-10-CM | POA: Diagnosis present

## 2014-06-15 DIAGNOSIS — I6789 Other cerebrovascular disease: Secondary | ICD-10-CM | POA: Diagnosis not present

## 2014-06-15 DIAGNOSIS — I10 Essential (primary) hypertension: Secondary | ICD-10-CM | POA: Diagnosis present

## 2014-06-15 DIAGNOSIS — I129 Hypertensive chronic kidney disease with stage 1 through stage 4 chronic kidney disease, or unspecified chronic kidney disease: Secondary | ICD-10-CM | POA: Diagnosis present

## 2014-06-15 DIAGNOSIS — E1140 Type 2 diabetes mellitus with diabetic neuropathy, unspecified: Secondary | ICD-10-CM | POA: Diagnosis present

## 2014-06-15 DIAGNOSIS — N184 Chronic kidney disease, stage 4 (severe): Secondary | ICD-10-CM | POA: Diagnosis present

## 2014-06-15 DIAGNOSIS — Z8673 Personal history of transient ischemic attack (TIA), and cerebral infarction without residual deficits: Secondary | ICD-10-CM | POA: Diagnosis not present

## 2014-06-15 DIAGNOSIS — IMO0002 Reserved for concepts with insufficient information to code with codable children: Secondary | ICD-10-CM | POA: Diagnosis present

## 2014-06-15 DIAGNOSIS — F329 Major depressive disorder, single episode, unspecified: Secondary | ICD-10-CM | POA: Diagnosis present

## 2014-06-15 DIAGNOSIS — R269 Unspecified abnormalities of gait and mobility: Secondary | ICD-10-CM

## 2014-06-15 DIAGNOSIS — E1129 Type 2 diabetes mellitus with other diabetic kidney complication: Secondary | ICD-10-CM | POA: Diagnosis present

## 2014-06-15 DIAGNOSIS — I5022 Chronic systolic (congestive) heart failure: Secondary | ICD-10-CM | POA: Diagnosis present

## 2014-06-15 DIAGNOSIS — R410 Disorientation, unspecified: Secondary | ICD-10-CM | POA: Diagnosis present

## 2014-06-15 DIAGNOSIS — R6889 Other general symptoms and signs: Secondary | ICD-10-CM

## 2014-06-15 DIAGNOSIS — Z95810 Presence of automatic (implantable) cardiac defibrillator: Secondary | ICD-10-CM

## 2014-06-15 DIAGNOSIS — Z79899 Other long term (current) drug therapy: Secondary | ICD-10-CM | POA: Diagnosis not present

## 2014-06-15 DIAGNOSIS — R531 Weakness: Secondary | ICD-10-CM | POA: Diagnosis not present

## 2014-06-15 DIAGNOSIS — Z7409 Other reduced mobility: Secondary | ICD-10-CM

## 2014-06-15 DIAGNOSIS — G459 Transient cerebral ischemic attack, unspecified: Secondary | ICD-10-CM

## 2014-06-15 DIAGNOSIS — I639 Cerebral infarction, unspecified: Secondary | ICD-10-CM | POA: Diagnosis present

## 2014-06-15 DIAGNOSIS — Z89612 Acquired absence of left leg above knee: Secondary | ICD-10-CM | POA: Diagnosis not present

## 2014-06-15 DIAGNOSIS — Z794 Long term (current) use of insulin: Secondary | ICD-10-CM

## 2014-06-15 DIAGNOSIS — I2510 Atherosclerotic heart disease of native coronary artery without angina pectoris: Secondary | ICD-10-CM | POA: Diagnosis present

## 2014-06-15 DIAGNOSIS — Z7901 Long term (current) use of anticoagulants: Secondary | ICD-10-CM

## 2014-06-15 DIAGNOSIS — G609 Hereditary and idiopathic neuropathy, unspecified: Secondary | ICD-10-CM | POA: Diagnosis present

## 2014-06-15 DIAGNOSIS — Z955 Presence of coronary angioplasty implant and graft: Secondary | ICD-10-CM | POA: Diagnosis not present

## 2014-06-15 DIAGNOSIS — M24652 Ankylosis, left hip: Secondary | ICD-10-CM | POA: Diagnosis not present

## 2014-06-15 DIAGNOSIS — E1121 Type 2 diabetes mellitus with diabetic nephropathy: Secondary | ICD-10-CM | POA: Diagnosis present

## 2014-06-15 DIAGNOSIS — R29818 Other symptoms and signs involving the nervous system: Secondary | ICD-10-CM | POA: Diagnosis not present

## 2014-06-15 LAB — COMPREHENSIVE METABOLIC PANEL
ALT: 24 U/L (ref 0–53)
ANION GAP: 10 (ref 5–15)
AST: 27 U/L (ref 0–37)
Albumin: 3.7 g/dL (ref 3.5–5.2)
Alkaline Phosphatase: 53 U/L (ref 39–117)
BILIRUBIN TOTAL: 1.5 mg/dL — AB (ref 0.3–1.2)
BUN: 59 mg/dL — AB (ref 6–23)
CO2: 21 mmol/L (ref 19–32)
CREATININE: 2.52 mg/dL — AB (ref 0.50–1.35)
Calcium: 8.5 mg/dL (ref 8.4–10.5)
Chloride: 105 mmol/L (ref 96–112)
GFR calc Af Amer: 27 mL/min — ABNORMAL LOW (ref >90)
GFR, EST NON AFRICAN AMERICAN: 23 mL/min — AB (ref >90)
GLUCOSE: 161 mg/dL — AB (ref 70–99)
Potassium: 5 mmol/L (ref 3.5–5.1)
Sodium: 136 mmol/L (ref 135–145)
Total Protein: 6.7 g/dL (ref 6.0–8.3)

## 2014-06-15 LAB — TROPONIN I

## 2014-06-15 LAB — PROTIME-INR
INR: 2.39 — AB (ref 0.00–1.49)
PROTHROMBIN TIME: 26.2 s — AB (ref 11.6–15.2)

## 2014-06-15 LAB — URINALYSIS, ROUTINE W REFLEX MICROSCOPIC
Bilirubin Urine: NEGATIVE
Glucose, UA: NEGATIVE mg/dL
HGB URINE DIPSTICK: NEGATIVE
Ketones, ur: NEGATIVE mg/dL
LEUKOCYTES UA: NEGATIVE
Nitrite: NEGATIVE
PROTEIN: NEGATIVE mg/dL
Specific Gravity, Urine: 1.012 (ref 1.005–1.030)
UROBILINOGEN UA: 0.2 mg/dL (ref 0.0–1.0)
pH: 6 (ref 5.0–8.0)

## 2014-06-15 LAB — CBC WITH DIFFERENTIAL/PLATELET
BASOS ABS: 0 10*3/uL (ref 0.0–0.1)
Basophils Relative: 0 % (ref 0–1)
EOS ABS: 0.1 10*3/uL (ref 0.0–0.7)
EOS PCT: 1 % (ref 0–5)
HCT: 43.5 % (ref 39.0–52.0)
Hemoglobin: 15 g/dL (ref 13.0–17.0)
Lymphocytes Relative: 18 % (ref 12–46)
Lymphs Abs: 1.7 10*3/uL (ref 0.7–4.0)
MCH: 29.6 pg (ref 26.0–34.0)
MCHC: 34.5 g/dL (ref 30.0–36.0)
MCV: 86 fL (ref 78.0–100.0)
Monocytes Absolute: 0.8 10*3/uL (ref 0.1–1.0)
Monocytes Relative: 9 % (ref 3–12)
NEUTROS PCT: 72 % (ref 43–77)
Neutro Abs: 6.7 10*3/uL (ref 1.7–7.7)
PLATELETS: 147 10*3/uL — AB (ref 150–400)
RBC: 5.06 MIL/uL (ref 4.22–5.81)
RDW: 14.5 % (ref 11.5–15.5)
WBC: 9.3 10*3/uL (ref 4.0–10.5)

## 2014-06-15 LAB — GLUCOSE, CAPILLARY: Glucose-Capillary: 155 mg/dL — ABNORMAL HIGH (ref 70–99)

## 2014-06-15 MED ORDER — OXYCODONE-ACETAMINOPHEN 7.5-325 MG PO TABS
1.0000 | ORAL_TABLET | Freq: Four times a day (QID) | ORAL | Status: DC | PRN
Start: 2014-06-15 — End: 2014-06-16

## 2014-06-15 MED ORDER — SODIUM CHLORIDE 0.9 % IV BOLUS (SEPSIS)
500.0000 mL | Freq: Once | INTRAVENOUS | Status: AC
Start: 2014-06-15 — End: 2014-06-15
  Administered 2014-06-15: 500 mL via INTRAVENOUS

## 2014-06-15 MED ORDER — LOSARTAN POTASSIUM 50 MG PO TABS
100.0000 mg | ORAL_TABLET | Freq: Every day | ORAL | Status: DC
  Administered 2014-06-16: 100 mg via ORAL
  Filled 2014-06-15: qty 2

## 2014-06-15 MED ORDER — SENNOSIDES-DOCUSATE SODIUM 8.6-50 MG PO TABS: 1.0000 | ORAL_TABLET | Freq: Every evening | ORAL | Status: DC | PRN

## 2014-06-15 MED ORDER — FINASTERIDE 5 MG PO TABS
5.0000 mg | ORAL_TABLET | Freq: Every day | ORAL | Status: DC
  Administered 2014-06-16: 5 mg via ORAL
  Filled 2014-06-15: qty 1

## 2014-06-15 MED ORDER — WARFARIN - PHARMACIST DOSING INPATIENT
Freq: Every day | Status: DC
Start: 2014-06-16 — End: 2014-06-16

## 2014-06-15 MED ORDER — CARVEDILOL 25 MG PO TABS
25.0000 mg | ORAL_TABLET | Freq: Two times a day (BID) | ORAL | Status: DC
  Administered 2014-06-16: 25 mg via ORAL
  Filled 2014-06-15: qty 1

## 2014-06-15 MED ORDER — OXYBUTYNIN CHLORIDE ER 10 MG PO TB24: 10.0000 mg | ORAL_TABLET | Freq: Every day | ORAL | Status: DC

## 2014-06-15 MED ORDER — ALLOPURINOL 100 MG PO TABS: 100.0000 mg | ORAL_TABLET | Freq: Every day | ORAL | Status: DC | PRN

## 2014-06-15 MED ORDER — STROKE: EARLY STAGES OF RECOVERY BOOK
Freq: Once | Status: AC
  Administered 2014-06-15: 21:00:00
  Filled 2014-06-15: qty 1

## 2014-06-15 MED ORDER — CARVEDILOL 12.5 MG PO TABS
12.5000 mg | ORAL_TABLET | Freq: Two times a day (BID) | ORAL | Status: DC
  Administered 2014-06-15: 12.5 mg via ORAL
  Filled 2014-06-15: qty 1

## 2014-06-15 MED ORDER — SODIUM CHLORIDE 0.9 % IV SOLN
INTRAVENOUS | Status: DC
  Administered 2014-06-15: 21:00:00 via INTRAVENOUS

## 2014-06-15 MED ORDER — INSULIN ASPART PROT & ASPART (70-30 MIX) 100 UNIT/ML ~~LOC~~ SUSP
7.0000 [IU] | Freq: Two times a day (BID) | SUBCUTANEOUS | Status: DC
Start: 2014-06-16 — End: 2014-06-16
  Administered 2014-06-16: 7 [IU] via SUBCUTANEOUS
  Filled 2014-06-15: qty 10

## 2014-06-15 MED ORDER — GABAPENTIN 300 MG PO CAPS
300.0000 mg | ORAL_CAPSULE | Freq: Three times a day (TID) | ORAL | Status: DC
  Administered 2014-06-15 – 2014-06-16 (×2): 300 mg via ORAL
  Filled 2014-06-15 (×2): qty 1

## 2014-06-15 MED ORDER — AMLODIPINE BESYLATE 5 MG PO TABS
5.0000 mg | ORAL_TABLET | Freq: Every day | ORAL | Status: DC
  Administered 2014-06-16: 5 mg via ORAL
  Filled 2014-06-15: qty 1

## 2014-06-15 NOTE — ED Notes (Signed)
Pt c/o weakness, confusion onset yesterday, pt states his speech was slurred today at 1300, states he could not write today or yesterday. Last seen normal was yesterday morning. Pt had episode of urinary incontinence en route to ED, states this is not normal.

## 2014-06-15 NOTE — ED Provider Notes (Signed)
CSN: [id number]     Arrival date & time 06/15/14  1442 History   First MD Initiated Contact with Patient 06/15/14 1458     Chief Complaint  Patient presents with  . Altered Mental Status     (Consider location/radiation/quality/duration/timing/severity/associated sxs/prior Treatment) Patient is a 80 y.o. male presenting with altered mental status.  Altered Mental Status Presenting symptoms: confusion   Severity:  Mild Most recent episode:  Yesterday Episode history:  Multiple Duration:  1 day Timing:  Intermittent Progression:  Worsening Chronicity:  New Context comment:  Spontaneously Associated symptoms: no abdominal pain, no fever, no headaches, no nausea and no vomiting     Past Medical History  Diagnosis Date  . Gout   . Benign prostatic hypertrophy     takes Proscar daily  . Atrial fibrillation     takes Warfarin daily  . Chronic systolic dysfunction of left ventricle     a. mixed ischemic and nonischemic CM,  EF 35%. b. s/p AICD implantation.  . ED (erectile dysfunction)   . Arthritis   . Pacemaker     medtronic  . CAD (coronary artery disease)     a. s/p mid LAD stenting with DES 2008 Dr. Daneen Schick  . ICD (implantable cardiac defibrillator) in place     medtronic, Dr. Rayann Heman  . ICD (implantable cardiac defibrillator) in place   . Sleep apnea     hx of "had surgery for"  . Automatic implantable cardioverter-defibrillator in situ   . Depression   . Numbness and tingling     Hx; 65f left foot  . PAD (peripheral artery disease)     Severe by PV angiogram 09/2011  . Renal artery stenosis     s/p stenting 2009  . PAD (peripheral artery disease)     s/p multiple LLE bypass grafts; left mid-distal SCA occlusion by 08/2012 duplex  . Hypertension     takes Carvedilol and Losartan daily  . Constipation     takes Miralax daily as needed and Colace daily   . Type II diabetes mellitus     takes Novolog 70/30  . CHF (congestive heart failure)     takes Lasix daily   . History of MRSA infection   . Insomnia     TAKES TRAZODONE NIGHTLY   Past Surgical History  Procedure Laterality Date  . Turp vaporization    . Cardiac defibrillator placement  12/26/09    pacemaker combo  . Cervical epidural injection  2013  . Femoral-tibial bypass graft  09/25/2011    Procedure: BYPASS GRAFT FEMORAL-TIBIAL ARTERY;  Surgeon: Mal Misty, MD;  Location: [facility];  Service: Vascular;  Laterality: Left;  Left Femoral - Anterior Tibial Bypass;  saphenous vein graft from left leg  . Intraoperative arteriogram  09/25/2011    Procedure: INTRA OPERATIVE ARTERIOGRAM;  Surgeon: Mal Misty, MD;  Location: [place];  Service: Vascular;  Laterality: Left;  . Femoral-tibial bypass graft  02/07/2012    Procedure: BYPASS GRAFT FEMORAL-TIBIAL ARTERY;  Surgeon: Rosetta Posner, MD;  Location: [place];  Service: Vascular;  Laterality: Left;  Thrombectomy Left Femoral - Tibial Bypass Graft  . Embolectomy  02/07/2012    Procedure: EMBOLECTOMY;  Surgeon: Rosetta Posner, MD;  Location: [place];  Service: Vascular;  Laterality: Left;  . Femoral-tibial bypass graft  04/03/2012    Procedure: BYPASS GRAFT FEMORAL-TIBIAL ARTERY;  Surgeon: Mal Misty, MD;  Location: [place];  Service: Vascular;  Laterality: Left;  Redo  .  Insert / replace / remove pacemaker  2007  . Coronary angioplasty with stent placement  ~ 2000  . Coronary angioplasty    . Uvulopalatopharyngoplasty (uppp)/tonsillectomy/septoplasty  06/30/2003    Archie Endo 06/30/2003 (07/10/2012)  . Shoulder open rotator cuff repair Right 2001    repair of lacerated right/notes 10/11/1999  (07/10/2012)  . Foot surgery Right 03/20/2001    "have plates and screws in; didn't break it" (07/10/2012)  . Carpal tunnel release Right 2002    Archie Endo 03/20/2001 (07/10/2012)  . Biceps tendon repair Right 2001    Archie Endo 03/20/2001 (07/10/2012)  . Renal artery stent  2009  . Femoral-popliteal bypass graft Left 09/16/2012    Procedure: LEFT FEMORAL-POPLITEAL BYPASS GRAFT WITH  GORTEX Propaten Graft 6x80 Thin Wall and Left lower leg Angiogram;  Surgeon: Mal Misty, MD;  Location: [place];  Service: Vascular;  Laterality: Left;  . Colonoscopy      Hx; of  . Tonsillectomy    . Adenoidectomy      Hx; of   . Femoral-tibial bypass graft Left 09/30/2012    Procedure: REDO LEFT FEMORAL-ANTERIOR TIBIAL ARTERY BYPASS USING COMPOSITE CEPHALIC AND BASILIC VEIN GRAFT FROM LEFT ARM;  Surgeon: Mal Misty, MD;  Location: [place];  Service: Vascular;  Laterality: Left;  . I&d extremity Left 10/21/2012    Procedure: EXPLORATION AND DEBRIDEMENT OF LEFT GROIN WOUND;  Surgeon: Mal Misty, MD;  Location: [place];  Service: Vascular;  Laterality: Left;  . Patch angioplasty Left 10/21/2012    Procedure: PATCH ANGIOPLASTY;  Surgeon: Mal Misty, MD;  Location: [place];  Service: Vascular;  Laterality: Left;  . Groin debridement Left 11/12/2012    Procedure: CLOSURE INGUINAL WOUND;  Surgeon: Mal Misty, MD;  Location: [place];  Service: Vascular;  Laterality: Left;  . Embolectomy Left 12/16/2012    Procedure: THROMBECTOMY  LEFT LEG BYPASS;  Surgeon: Elam Dutch, MD;  Location: [place];  Service: Vascular;  Laterality: Left;  . Leg amputation above knee Left 02/04/2013    DR LAWSON  . Amputation Left 02/04/2013    Procedure: AMPUTATION ABOVE KNEE-LEFT;  Surgeon: Mal Misty, MD;  Location: [place];  Service: Vascular;  Laterality: Left;  . Removal of graft Left 04/16/2013    Procedure: I & D LEFT AKA WOUND, POSSIBLE REMOVAL OF INFECTED GORTEX GRAFT;  Surgeon: Mal Misty, MD;  Location: [place];  Service: Vascular;  Laterality: Left;  . Abdominal aortagram N/A 09/11/2011    Procedure: ABDOMINAL Maxcine Ham;  Surgeon: Wellington Hampshire, MD;  Location[facility] CATH LAB;  Service: Cardiovascular;  Laterality: N/A;  . Lower extremity angiogram Left 12/25/2011    Procedure: LOWER EXTREMITY ANGIOGRAM;  Surgeon: Serafina Mitchell, MD;  Location: [facility] CATH LAB;  Service: Cardiovascular;  Laterality:  Left;  lt leg angio co2  . Abdominal angiogram  12/25/2011    Procedure: ABDOMINAL ANGIOGRAM;  Surgeon: Serafina Mitchell, MD;  Location: [facility] CATH LAB;  Service: Cardiovascular;;  . Lower extremity angiogram N/A 07/07/2012    Procedure: LOWER EXTREMITY ANGIOGRAM;  Surgeon: Conrad , MD;  Location: [facility] CATH LAB;  Service: Cardiovascular;  Laterality: N/A;  . Lower extremity angiogram N/A 09/15/2012    Procedure: LOWER EXTREMITY ANGIOGRAM;  Surgeon: Serafina Mitchell, MD;  Location: [facility] CATH LAB;  Service: Cardiovascular;  Laterality: N/A;  . Upper extremity angiogram  09/15/2012    Procedure: UPPER EXTREMITY ANGIOGRAM;  Surgeon: Serafina Mitchell, MD;  Location: [facility] CATH LAB;  Service:  Cardiovascular;;  . Biv icd genertaor change out N/A 11/09/2013    Procedure: BIV ICD GENERTAOR CHANGE OUT;  Surgeon: Coralyn Mark, MD;  Location: [facility] CATH LAB;  Service: Cardiovascular;  Laterality: N/A;   Family History  Problem Relation Age of Onset  . Cancer      breast/fhx  . Heart disease      fhx  . Diabetes Neg Hx   . Cancer Father    History  Substance Use Topics  . Smoking status: Never Smoker   . Smokeless tobacco: Never Used     Comment: 1 cigar per week  . Alcohol Use: 2.4 oz/week    2 Glasses of wine, 2 Shots of liquor per week     Comment: maybe once a week    Review of Systems  Constitutional: Negative for fever.  HENT: Negative for drooling and rhinorrhea.   Eyes: Negative for pain.  Respiratory: Negative for cough and shortness of breath.   Cardiovascular: Negative for chest pain and leg swelling.  Gastrointestinal: Negative for nausea, vomiting, abdominal pain and diarrhea.  Genitourinary: Negative for dysuria and hematuria.  Musculoskeletal: Negative for gait problem and neck pain.  Skin: Negative for color change.  Neurological: Negative for numbness and headaches.       Confusion  Paresthesias in fingers bilaterally  Hematological: Negative for adenopathy.   Psychiatric/Behavioral: Positive for confusion. Negative for behavioral problems.  All other systems reviewed and are negative.     Allergies  Other and Bactrim ds  Home Medications   Prior to Admission medications   Medication Sig Start Date End Date Taking? Authorizing Provider  allopurinol (ZYLOPRIM) 100 MG tablet Take 1 tablet (100 mg total) by mouth daily as needed. Patient taking differently: Take 100 mg by mouth daily as needed (gout).  05/23/14  Yes Laurey Morale, MD  amLODipine (NORVASC) 5 MG tablet Take 5 mg by mouth daily.   Yes Historical Provider, MD  carvedilol (COREG) 25 MG tablet Take 1 tablet (25 mg total) by mouth 2 (two) times daily with a meal. 04/28/14  Yes Laurey Morale, MD  finasteride (PROSCAR) 5 MG tablet Take 1 tablet (5 mg total) by mouth daily. 04/28/14  Yes Laurey Morale, MD  furosemide (LASIX) 40 MG tablet Take 1 tablet (40 mg total) by mouth daily. 04/28/14  Yes Laurey Morale, MD  gabapentin (NEURONTIN) 300 MG capsule Take 1 capsule (300 mg total) by mouth 3 (three) times daily. 05/05/14  Yes Laurey Morale, MD  glucose blood (ONE TOUCH ULTRA TEST) test strip use to test blood sugar two times daily as instructed by physician. 09/20/13  Yes Laurey Morale, MD  insulin aspart protamine- aspart (NOVOLOG MIX 70/30) (70-30) 100 UNIT/ML injection Inject 7 Units into the skin 2 (two) times daily with a meal. 03/03/13  Yes Timoteo Gaul, FNP  Insulin Pen Needle 31G X 8 MM MISC Use 2-3 times per day and diagnosis code is 250.00 11/25/13  Yes Laurey Morale, MD  losartan (COZAAR) 100 MG tablet Take 1 tablet (100 mg total) by mouth daily. 05/23/14  Yes Laurey Morale, MD  oxyCODONE-acetaminophen (PERCOCET) 7.5-325 MG per tablet Take 1 tablet by mouth every 6 (six) hours as needed for pain. 06/03/14  Yes Meredith Staggers, MD  warfarin (COUMADIN) 5 MG tablet Take as directed by anticoagulation clinic Patient taking differently: Take 2.5 mg by mouth daily at 6 PM. Take as directed  by anticoagulation clinic 04/28/14  Yes Laurey Morale, MD  nitroGLYCERIN (NITROSTAT) 0.4 MG SL tablet Place 1 tablet (0.4 mg total) under the tongue every 5 (five) minutes as needed for chest pain. For chest pain, max 3 doses Patient not taking: Reported on 06/15/2014 05/28/13   Laurey Morale, MD  oxybutynin (DITROPAN-XL) 10 MG 24 hr tablet Take 1 tablet (10 mg total) by mouth at bedtime. 02/07/14   Laurey Morale, MD  predniSONE (DELTASONE) 20 MG tablet Take 0.5 tablets (10 mg total) by mouth as directed. Take 1 tab 3x daily for 4 days then 1 tab 2x daily for 4 days then 1 tab once daily for 4 days, then 1/2 tab daily for 4 days and off. 06/03/14   Meredith Staggers, MD  topiramate (TOPAMAX) 25 MG tablet Take 1 tablet (25 mg total) by mouth at bedtime. 06/03/14   Meredith Staggers, MD   BP 130/62 mmHg  Pulse 62  Temp(Src) 97.4 F (36.3 C) (Oral)  Resp 18  SpO2 99% Physical Exam  Constitutional: He is oriented to person, place, and time. He appears well-developed and well-nourished.  HENT:  Head: Normocephalic and atraumatic.  Right Ear: External ear normal.  Left Ear: External ear normal.  Nose: Nose normal.  Mouth/Throat: Oropharynx is clear and moist. No oropharyngeal exudate.  Eyes: Conjunctivae and EOM are normal. Pupils are equal, round, and reactive to light.  Neck: Normal range of motion. Neck supple.  Cardiovascular: Normal rate, regular rhythm, normal heart sounds and intact distal pulses.  Exam reveals no gallop and no friction rub.   No murmur heard. Pulmonary/Chest: Effort normal and breath sounds normal. No respiratory distress. He has no wheezes.  Pacemaker in left upper chest without tenderness.  Abdominal: Soft. Bowel sounds are normal. He exhibits no distension. There is no tenderness. There is no rebound and no guarding.  Musculoskeletal: Normal range of motion. He exhibits no edema or tenderness.  Warm well perfused right lower extremity. Normal capillary refill in the distal  right lower extremity.  Neurological: He is alert and oriented to person, place, and time.  alert, oriented x3 speech: normal in context and clarity memory: intact grossly cranial nerves II-XII: intact motor strength: full proximally and distally no involuntary movements or tremors sensation: intact to light touch diffusely  cerebellar: finger-to-nose intact gait: deferred  Skin: Skin is warm and dry.  Psychiatric: He has a normal mood and affect. His behavior is normal.  Nursing note and vitals reviewed.   ED Course  Procedures (including critical care time) Labs Review Labs Reviewed  CBC WITH DIFFERENTIAL/PLATELET - Abnormal; Notable for the following:    Platelets 147 (*)    All other components within normal limits  COMPREHENSIVE METABOLIC PANEL - Abnormal; Notable for the following:    Glucose, Bld 161 (*)    BUN 59 (*)    Creatinine, Ser 2.52 (*)    Total Bilirubin 1.5 (*)    GFR calc non Af Amer 23 (*)    GFR calc Af Amer 27 (*)    All other components within normal limits  PROTIME-INR - Abnormal; Notable for the following:    Prothrombin Time 26.2 (*)    INR 2.39 (*)    All other components within normal limits  URINE CULTURE  TROPONIN I  URINALYSIS, ROUTINE W REFLEX MICROSCOPIC    Imaging Review Dg Chest 2 View  06/15/2014   CLINICAL DATA:  Confusion, history atrial fibrillation, coronary artery disease, AICD, type 2 diabetes, CHF  EXAM: CHEST  2 VIEW  COMPARISON:  04/15/2013  FINDINGS: LEFT subclavian AICD with leads projecting at RIGHT atrium, RIGHT ventricle and coronary sinus.  Enlargement of cardiac silhouette.  Mediastinal contours and pulmonary vascularity normal.  Chronic elevation of LEFT diaphragm.  Minimal central peribronchial thickening.  No acute infiltrate, pleural effusion or pneumothorax.  Bones unremarkable.  IMPRESSION: Enlargement of cardiac silhouette post AICD placement.  No acute abnormalities.   Electronically Signed   By: Lavonia Dana M.D.    On: 06/15/2014 15:33   Ct Head Wo Contrast  06/15/2014   CLINICAL DATA:  Weakness. Confusion beginning yesterday. Speech disturbance. Language disturbance.  EXAM: CT HEAD WITHOUT CONTRAST  TECHNIQUE: Contiguous axial images were obtained from the base of the skull through the vertex without intravenous contrast.  COMPARISON:  07/08/2012  FINDINGS: The brainstem and cerebellum are unremarkable. The cerebral hemispheres show mild generalized atrophy. There is old infarction in the left basal ganglia and internal capsule region. There are mild chronic small-vessel changes of the deep white matter. No sign of acute infarction. No mass lesion, hemorrhage, hydrocephalus or extra-axial collection. No calvarial abnormality. Sinuses, middle ears and mastoids are clear. There is atherosclerotic calcification of the major vessels at the base of the brain.  IMPRESSION: No acute finding by CT. Chronic small vessel change of the hemispheric white matter. 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   Electronically Signed   By: Nelson Chimes M.D.   On: 06/15/2014 15:42     EKG Interpretation   Date/Time:  Wednesday June 15 2014 15:42:08 EDT Ventricular Rate:  61 PR Interval:    QRS Duration: 129 QT Interval:  463 QTC Calculation: 466 R Axis:   -119 Text Interpretation:  Junctional rhythm Right bundle branch block  Anterolateral infarct, age indeterminate No significant change since last  tracing Confirmed by Sharmain Lastra  MD, Tasean Mancha ([id number]) on 06/15/2014 5:25:42 PM      MDM   Final diagnoses:  Confusion  Transient cerebral ischemia, unspecified transient cerebral ischemia type    3:13 PM 80 y.o. male w hx of afib on coumadin, CAD, hx of ICD placement, PVD s/p fem tib bypass, s/p left AKA, CHF, gout who presents with expressive issues which began yesterday. He notes they have been intermittent. He states that he has had difficulty expressing himself and writing thoughts down today. He is alert  and oriented 3 currently and aware of his symptoms. He has no issues now. He denies any fevers, vomiting, diarrhea, cough, or pain. He is afebrile and vital signs are unremarkable here. We'll get screening labs and imaging.  Of note the patient states that he just finished a course of prednisone for gout in his left hand which has significantly resolved.  5:24 PM patient continues to appear well. Workup discussed with Dr. Doy Mince of neurology. She recommends admission at [facility]. Discussed case with Dr. Sallyanne Havers.     Pamella Pert, MD 06/15/14 1725

## 2014-06-15 NOTE — Consult Note (Signed)
Referring Physician: Charlies Silvers    Chief Complaint: Confusion  HPI: [name] is an 80 y.o. male with a history of PVD who reports that on yesterday atabout noon he had the acute onset of confusion and blurry vision.  He reports that he was trying to pay some bills and could not see to write them.  He had to put on his reading glasses which is not usually the case.  He also then was unable to write.  When he did write something down it was unintelligible.  He remained confused throughout the day and today as well.  Symptoms cleared just prior to presentation.  Patient is on Coumadin with therapeutic INR.   The patient also reports left hand tingling that has been present intermittently for the past week.  When it occurs it lasts a few hours before resolving spontaneously.    Date last known well: Date: 06/14/2014 Time last known well: Time: 12:00 tPA Given: No: Resolution of symptoms, On Coumadin with therapeutic INR  Past Medical History  Diagnosis Date  . Gout   . Benign prostatic hypertrophy     takes Proscar daily  . Atrial fibrillation     takes Warfarin daily  . Chronic systolic dysfunction of left ventricle     a. mixed ischemic and nonischemic CM,  EF 35%. b. s/p AICD implantation.  . ED (erectile dysfunction)   . Arthritis   . Pacemaker     medtronic  . CAD (coronary artery disease)     a. s/p mid LAD stenting with DES 2008 Dr. Daneen Schick  . ICD (implantable cardiac defibrillator) in place     medtronic, Dr. Rayann Heman  . ICD (implantable cardiac defibrillator) in place   . Sleep apnea     hx of "had surgery for"  . Automatic implantable cardioverter-defibrillator in situ   . Depression   . Numbness and tingling     Hx; 69f left foot  . PAD (peripheral artery disease)     Severe by PV angiogram 09/2011  . Renal artery stenosis     s/p stenting 2009  . PAD (peripheral artery disease)     s/p multiple LLE bypass grafts; left mid-distal SCA occlusion by 08/2012 duplex  .  Hypertension     takes Carvedilol and Losartan daily  . Constipation     takes Miralax daily as needed and Colace daily   . Type II diabetes mellitus     takes Novolog 70/30  . CHF (congestive heart failure)     takes Lasix daily  . History of MRSA infection   . Insomnia     TAKES TRAZODONE NIGHTLY    Past Surgical History  Procedure Laterality Date  . Turp vaporization    . Cardiac defibrillator placement  12/26/09    pacemaker combo  . Cervical epidural injection  2013  . Femoral-tibial bypass graft  09/25/2011    Procedure: BYPASS GRAFT FEMORAL-TIBIAL ARTERY;  Surgeon: Mal Misty, MD;  Location: [facility] OR;  Service: Vascular;  Laterality: Left;  Left Femoral - Anterior Tibial Bypass;  saphenous vein graft from left leg  . Intraoperative arteriogram  09/25/2011    Procedure: INTRA OPERATIVE ARTERIOGRAM;  Surgeon: Mal Misty, MD;  Location: [place];  Service: Vascular;  Laterality: Left;  . Femoral-tibial bypass graft  02/07/2012    Procedure: BYPASS GRAFT FEMORAL-TIBIAL ARTERY;  Surgeon: Rosetta Posner, MD;  Location: [place];  Service: Vascular;  Laterality: Left;  Thrombectomy Left Femoral -  Tibial Bypass Graft  . Embolectomy  02/07/2012    Procedure: EMBOLECTOMY;  Surgeon: Rosetta Posner, MD;  Location: [place];  Service: Vascular;  Laterality: Left;  . Femoral-tibial bypass graft  04/03/2012    Procedure: BYPASS GRAFT FEMORAL-TIBIAL ARTERY;  Surgeon: Mal Misty, MD;  Location: [place];  Service: Vascular;  Laterality: Left;  Redo  . Insert / replace / remove pacemaker  2007  . Coronary angioplasty with stent placement  ~ 2000  . Coronary angioplasty    . Uvulopalatopharyngoplasty (uppp)/tonsillectomy/septoplasty  06/30/2003    Archie Endo 06/30/2003 (07/10/2012)  . Shoulder open rotator cuff repair Right 2001    repair of lacerated right/notes 10/11/1999  (07/10/2012)  . Foot surgery Right 03/20/2001    "have plates and screws in; didn't break it" (07/10/2012)  . Carpal tunnel release Right 2002     Archie Endo 03/20/2001 (07/10/2012)  . Biceps tendon repair Right 2001    Archie Endo 03/20/2001 (07/10/2012)  . Renal artery stent  2009  . Femoral-popliteal bypass graft Left 09/16/2012    Procedure: LEFT FEMORAL-POPLITEAL BYPASS GRAFT WITH GORTEX Propaten Graft 6x80 Thin Wall and Left lower leg Angiogram;  Surgeon: Mal Misty, MD;  Location: [place];  Service: Vascular;  Laterality: Left;  . Colonoscopy      Hx; of  . Tonsillectomy    . Adenoidectomy      Hx; of   . Femoral-tibial bypass graft Left 09/30/2012    Procedure: REDO LEFT FEMORAL-ANTERIOR TIBIAL ARTERY BYPASS USING COMPOSITE CEPHALIC AND BASILIC VEIN GRAFT FROM LEFT ARM;  Surgeon: Mal Misty, MD;  Location: [place];  Service: Vascular;  Laterality: Left;  . I&d extremity Left 10/21/2012    Procedure: EXPLORATION AND DEBRIDEMENT OF LEFT GROIN WOUND;  Surgeon: Mal Misty, MD;  Location: [place];  Service: Vascular;  Laterality: Left;  . Patch angioplasty Left 10/21/2012    Procedure: PATCH ANGIOPLASTY;  Surgeon: Mal Misty, MD;  Location: [place];  Service: Vascular;  Laterality: Left;  . Groin debridement Left 11/12/2012    Procedure: CLOSURE INGUINAL WOUND;  Surgeon: Mal Misty, MD;  Location: [place];  Service: Vascular;  Laterality: Left;  . Embolectomy Left 12/16/2012    Procedure: THROMBECTOMY  LEFT LEG BYPASS;  Surgeon: Elam Dutch, MD;  Location: [place];  Service: Vascular;  Laterality: Left;  . Leg amputation above knee Left 02/04/2013    DR LAWSON  . Amputation Left 02/04/2013    Procedure: AMPUTATION ABOVE KNEE-LEFT;  Surgeon: Mal Misty, MD;  Location: [place];  Service: Vascular;  Laterality: Left;  . Removal of graft Left 04/16/2013    Procedure: I & D LEFT AKA WOUND, POSSIBLE REMOVAL OF INFECTED GORTEX GRAFT;  Surgeon: Mal Misty, MD;  Location: [place];  Service: Vascular;  Laterality: Left;  . Abdominal aortagram N/A 09/11/2011    Procedure: ABDOMINAL Maxcine Ham;  Surgeon: Wellington Hampshire, MD;  Location: [facility] CATH  LAB;  Service: Cardiovascular;  Laterality: N/A;  . Lower extremity angiogram Left 12/25/2011    Procedure: LOWER EXTREMITY ANGIOGRAM;  Surgeon: Serafina Mitchell, MD;  Location: [facility] CATH LAB;  Service: Cardiovascular;  Laterality: Left;  lt leg angio co2  . Abdominal angiogram  12/25/2011    Procedure: ABDOMINAL ANGIOGRAM;  Surgeon: Serafina Mitchell, MD;  Location: [facility] CATH LAB;  Service: Cardiovascular;;  . Lower extremity angiogram N/A 07/07/2012    Procedure: LOWER EXTREMITY ANGIOGRAM;  Surgeon: Conrad Emigrant, MD;  Location: [facility] CATH LAB;  Service: Cardiovascular;  Laterality: N/A;  . Lower extremity angiogram N/A 09/15/2012    Procedure: LOWER EXTREMITY ANGIOGRAM;  Surgeon: Serafina Mitchell, MD;  Location: [facility] CATH LAB;  Service: Cardiovascular;  Laterality: N/A;  . Upper extremity angiogram  09/15/2012    Procedure: UPPER EXTREMITY ANGIOGRAM;  Surgeon: Serafina Mitchell, MD;  Location: [facility] CATH LAB;  Service: Cardiovascular;;  . Biv icd genertaor change out N/A 11/09/2013    Procedure: BIV ICD GENERTAOR CHANGE OUT;  Surgeon: Coralyn Mark, MD;  Location: [facility] CATH LAB;  Service: Cardiovascular;  Laterality: N/A;    Family History  Problem Relation Age of Onset  . Cancer      breast/fhx  . Heart disease      fhx  . Diabetes Neg Hx   . Cancer Father    Social History:  reports that he has been smoking Cigars.  He has never used smokeless tobacco. He reports that he drinks alcohol. He reports that he does not use illicit drugs.  Allergies:  Allergies  Allergen Reactions  . Other Other (See Comments)    Plastic tape tears skin  . Bactrim Ds [Sulfamethoxazole-Trimethoprim] Other (See Comments)    Hand tremors    Medications: I have reviewed the patient's current medications. Prior to Admission:  Current outpatient prescriptions:  .  allopurinol (ZYLOPRIM) 100 MG tablet, Take 1 tablet (100 mg total) by mouth daily as needed. (Patient taking differently: Take 100 mg by mouth daily as needed (gout).  ), Disp: 90 tablet, Rfl: 3 .  amLODipine (NORVASC) 5 MG tablet, Take 5 mg by mouth daily., Disp: , Rfl:  .  carvedilol (COREG) 25 MG tablet, Take 1 tablet (25 mg total) by mouth 2 (two) times daily with a meal., Disp: 180 tablet, Rfl: 3 .  finasteride (PROSCAR) 5 MG tablet, Take 1 tablet (5 mg total) by mouth daily., Disp: 90 tablet, Rfl: 3 .  furosemide (LASIX) 40 MG tablet, Take 1 tablet (40 mg total) by mouth daily., Disp: 90 tablet, Rfl: 3 .  gabapentin (NEURONTIN) 300 MG capsule, Take 1 capsule (300 mg total) by mouth 3 (three) times daily., Disp: 270 capsule, Rfl: 3 .  glucose blood (ONE TOUCH ULTRA TEST) test strip, use to test blood sugar two times daily as instructed by physician., Disp: 100 each, Rfl: 2 .  insulin aspart protamine- aspart (NOVOLOG MIX 70/30) (70-30) 100 UNIT/ML injection, Inject 7 Units into the skin 2 (two) times daily with a meal., Disp: , Rfl:  .  Insulin Pen Needle 31G X 8 MM MISC, Use 2-3 times per day and diagnosis code is 250.00, Disp: 100 each, Rfl: 1 .  losartan (COZAAR) 100 MG tablet, Take 1 tablet (100 mg total) by mouth daily., Disp: 90 tablet, Rfl: 3 .  oxyCODONE-acetaminophen (PERCOCET) 7.5-325 MG per tablet, Take 1 tablet by mouth every 6 (six) hours as needed for pain., Disp: 40 tablet, Rfl: 0 .  warfarin (COUMADIN) 5 MG tablet, Take as directed by anticoagulation clinic (Patient taking differently: Take 2.5 mg by mouth daily at 6 PM. Take as directed by anticoagulation clinic), Disp: 60 tablet, Rfl: 1 .  nitroGLYCERIN (NITROSTAT) 0.4 MG SL tablet, Place 1 tablet (0.4 mg total) under the tongue every 5 (five) minutes as needed for chest pain. For chest pain, max 3 doses (Patient not taking: Reported on 06/15/2014), Disp: 90 tablet, Rfl: 3 .  oxybutynin (DITROPAN-XL) 10 MG 24 hr tablet, Take 1 tablet (10 mg total) by mouth at bedtime.,  Disp: 30 tablet, Rfl: 2 .  predniSONE (DELTASONE) 20 MG tablet, Take 0.5 tablets (10 mg total) by mouth as directed. Take 1 tab  3x daily for 4 days then 1 tab 2x daily for 4 days then 1 tab once daily for 4 days, then 1/2 tab daily for 4 days and off., Disp: 26 tablet, Rfl: 0 .  topiramate (TOPAMAX) 25 MG tablet, Take 1 tablet (25 mg total) by mouth at bedtime., Disp: 30 tablet, Rfl: 2  ROS: History obtained from the patient  General ROS: negative for - chills, fatigue, fever, night sweats, weight gain or weight loss Psychological ROS: negative for - behavioral disorder, hallucinations, memory difficulties, mood swings or suicidal ideation Ophthalmic ROS: negative for - blurry vision, double vision, eye pain or loss of vision ENT ROS: negative for - epistaxis, nasal discharge, oral lesions, sore throat, tinnitus or vertigo Allergy and Immunology ROS: negative for - hives or itchy/watery eyes Hematological and Lymphatic ROS: negative for - bleeding problems, bruising or swollen lymph nodes Endocrine ROS: negative for - galactorrhea, hair pattern changes, polydipsia/polyuria or temperature intolerance Respiratory ROS: negative for - cough, hemoptysis, shortness of breath or wheezing Cardiovascular ROS: negative for - chest pain, dyspnea on exertion, edema or irregular heartbeat Gastrointestinal ROS: negative for - abdominal pain, diarrhea, hematemesis, nausea/vomiting or stool incontinence Genito-Urinary ROS: negative for - dysuria, hematuria, incontinence or urinary frequency/urgency Musculoskeletal ROS: Left AKA Neurological ROS: as noted in HPI Dermatological ROS: negative for rash and skin lesion changes  Physical Examination: Blood pressure 112/74, pulse 83, temperature 98 F (36.7 C), temperature source Oral, resp. rate 18, SpO2 100 %.  HEENT-  Normocephalic, no lesions, without obvious abnormality.  Normal external eye and conjunctiva.  Normal TM's bilaterally.  Normal auditory canals and external ears. Normal external nose, mucus membranes and septum.  Normal pharynx. Cardiovascular- S1, S2 normal, pulses  palpable throughout   Lungs- chest clear, no wheezing, rales, normal symmetric air entry Abdomen- soft, non-tender; bowel sounds normal; no masses,  no organomegaly Extremities- no edema Lymph-no adenopathy palpable Musculoskeletal-no joint tenderness, deformity or swelling Skin-multiple eccymoses  Neurological Examination Mental Status: Alert, oriented but takes some time to think of the month.  Speech fluent without evidence of aphasia.  Able to follow 3 step commands without difficulty. Cranial Nerves: II: Discs flat bilaterally; Visual fields grossly normal, pupils equal, round, reactive to light and accommodation III,IV, VI: ptosis not present, extra-ocular motions intact bilaterally V,VII: smile symmetric, facial light touch sensation normal bilaterally VIII: hearing normal bilaterally IX,X: gag reflex present XI: bilateral shoulder shrug XII: midline tongue extension Motor: Right : Upper extremity   5/5    Left:     Upper extremity   5/5  Lower extremity   5/5     Lower extremity   amputation Tone and bulk:normal tone throughout; no atrophy noted Sensory: Pinprick and light touch intact throughout, bilaterally Deep Tendon Reflexes: 2+ with absent right AJ.   Plantars: Right: downgoing   Left: amputation Cerebellar: normal finger-to-nose testing bilaterally Gait: not tested since prosthesis not in place.      Laboratory Studies:  Basic Metabolic Panel:  Recent Labs Lab 06/15/14 1535  NA 136  K 5.0  CL 105  CO2 21  GLUCOSE 161*  BUN 59*  CREATININE 2.52*  CALCIUM 8.5    Liver Function Tests:  Recent Labs Lab 06/15/14 1535  AST 27  ALT 24  ALKPHOS 53  BILITOT 1.5*  PROT 6.7  ALBUMIN 3.7  No results for input(s): LIPASE, AMYLASE in the last 168 hours. No results for input(s): AMMONIA in the last 168 hours.  CBC:  Recent Labs Lab 06/15/14 1535  WBC 9.3  NEUTROABS 6.7  HGB 15.0  HCT 43.5  MCV 86.0  PLT 147*    Cardiac Enzymes:  Recent  Labs Lab 06/15/14 1535  TROPONINI <0.03    BNP: Invalid input(s): POCBNP  CBG: No results for input(s): GLUCAP in the last 168 hours.  Microbiology: Results for orders placed or performed in visit on 12/28/13  Urine culture     Status: None   Collection Time: 12/28/13 11:07 AM  Result Value Ref Range Status   Culture PROTEUS MIRABILIS  Final   Colony Count >=100,000 COLONIES/ML  Final   Organism ID, Bacteria PROTEUS MIRABILIS  Final      Susceptibility   Proteus mirabilis -  (no method available)    AMPICILLIN >=32 Resistant     AMPICILLIN/SULBACTAM >=32 Resistant     PIP/TAZO <=4 Sensitive     IMIPENEM 4 Sensitive     CEFAZOLIN >=64 Resistant     CEFTRIAXONE >=64 Resistant     CEFTAZIDIME 4 Sensitive     CEFEPIME <=1 Sensitive     GENTAMICIN <=1 Sensitive     TOBRAMYCIN <=1 Sensitive     CIPROFLOXACIN <=0.25 Sensitive     LEVOFLOXACIN <=0.12 Sensitive     NITROFURANTOIN 128 Resistant     TRIMETH/SULFA <=20 Sensitive     Coagulation Studies:  Recent Labs  06/15/14 1535  LABPROT 26.2*  INR 2.39*    Urinalysis:  Recent Labs Lab 06/15/14 1608  COLORURINE YELLOW  LABSPEC 1.012  PHURINE 6.0  GLUCOSEU NEGATIVE  HGBUR NEGATIVE  BILIRUBINUR NEGATIVE  KETONESUR NEGATIVE  PROTEINUR NEGATIVE  UROBILINOGEN 0.2  NITRITE NEGATIVE  LEUKOCYTESUR NEGATIVE    Lipid Panel:    Component Value Date/Time   CHOL 267* 05/06/2012 1125   TRIG 158.0* 05/06/2012 1125   HDL 42.60 05/06/2012 1125   CHOLHDL 6 05/06/2012 1125   VLDL 31.6 05/06/2012 1125   LDLCALC 84 06/26/2011 1031    HgbA1C:  Lab Results  Component Value Date   HGBA1C 6.0 03/08/2013    Urine Drug Screen:     Component Value Date/Time   LABOPIA NEG 06/03/2014 1417   COCAINSCRNUR NEG 06/03/2014 1417   LABBENZ NEG 06/03/2014 1417   AMPHETMU NEG 06/03/2014 1417   THCU NEG 06/03/2014 1417   LABBARB NEG 06/03/2014 1417    Alcohol Level: No results for input(s): ETH in the last 168 hours.  Other  results: EKG: junctional rhythm at 61 bpm.  Imaging: Dg Chest 2 View  06/15/2014   CLINICAL DATA:  Confusion, history atrial fibrillation, coronary artery disease, AICD, type 2 diabetes, CHF  EXAM: CHEST  2 VIEW  COMPARISON:  04/15/2013  FINDINGS: LEFT subclavian AICD with leads projecting at RIGHT atrium, RIGHT ventricle and coronary sinus.  Enlargement of cardiac silhouette.  Mediastinal contours and pulmonary vascularity normal.  Chronic elevation of LEFT diaphragm.  Minimal central peribronchial thickening.  No acute infiltrate, pleural effusion or pneumothorax.  Bones unremarkable.  IMPRESSION: Enlargement of cardiac silhouette post AICD placement.  No acute abnormalities.   Electronically Signed   By: Lavonia Dana M.D.   On: 06/15/2014 15:33   Ct Head Wo Contrast  06/15/2014   CLINICAL DATA:  Weakness. Confusion beginning yesterday. Speech disturbance. Language disturbance.  EXAM: CT HEAD WITHOUT CONTRAST  TECHNIQUE: Contiguous axial images were obtained from the base  of the skull through the vertex without intravenous contrast.  COMPARISON:  07/08/2012  FINDINGS: The brainstem and cerebellum are unremarkable. The cerebral hemispheres show mild generalized atrophy. There is old infarction in the left basal ganglia and internal capsule region. There are mild chronic small-vessel changes of the deep white matter. No sign of acute infarction. No mass lesion, hemorrhage, hydrocephalus or extra-axial collection. No calvarial abnormality. Sinuses, middle ears and mastoids are clear. There is atherosclerotic calcification of the major vessels at the base of the brain.  IMPRESSION: No acute finding by CT. Chronic small vessel change of the hemispheric white matter. 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   Electronically Signed   By: Nelson Chimes M.D.   On: 06/15/2014 15:42    Assessment: 80 y.o. male with an episode of confusion and blurry vision.  Etiology unclear but can not rule out TIA.   Patient currently asymptomatic.  On Coumadin and INR therapeutic.  Head CT personally reviewed and shows chronic vascular changes but nothing acute.  MRI unable to be performed secondary to pacer and ICD.  Patient with multiple vascular risk factors.  Last A1c on 04/26/14 was 8.7.    Stroke Risk Factors - atrial fibrillation, diabetes mellitus, hyperlipidemia, hypertension and smoking  Plan: 1. HgbA1c, fasting lipid panel 2. Repeat head CT in 24-48 hours 3. PT consult, OT consult, Speech consult 4. Echocardiogram 5. Carotid dopplers 6. Prophylactic therapy-Continue Coumadin 7. NPO until RN stroke swallow screen 8. Telemetry monitoring 9. Frequent neuro checks   Alexis Goodell, MD [facility] [phone] 06/15/2014, 6:15 PM

## 2014-06-15 NOTE — ED Notes (Signed)
Patient is in xray.  I will perform labs when he returns

## 2014-06-15 NOTE — Telephone Encounter (Signed)
[place] [facility] - Client [facility] Patient Name: [name] Gender: Unknown DOB: [birth year] Age: 80 Y 2 M 30 D Return Phone Number: [phone] (Primary) Address: [address] City/State/Zip: [place] [address] Client [facility] - Client Client Site [place] - Day Physician [place], [place] Type Call Call Type Triage / Clinical Relationship To Patient Self Appointment Disposition EMR Appointment Not Necessary Info pasted into Epic No Return Phone Number [phone] (Primary) Chief Complaint SPEAK - sudden inability to talk or slurred speech Initial Comment caller states he feels off balance, blurry vision, trouble talking and seems confused at times PreDisposition InappropriateToAsk Nurse Assessment Nurse: Glennon Mac, RN, Amber Date/Time (Eastern Time): 06/15/2014 1:03:57 PM Confirm and document reason for call. If symptomatic, describe symptoms. ---Caller states he feels off balance, blurred vision. He denies any chest pain or jaw pain at this time. He does not have a headache and says his BP is good. BS is 104. He states he feels he has trouble talking and confused at times. He has clear articulation and mentation on assessment. Has the patient traveled out of the country within the last 30 days? ---No Does the patient require triage? ---Yes Related visit to physician within the last 2 weeks? ---No Does the PT have any chronic conditions? (i.e. diabetes, asthma, etc.) ---Yes List chronic conditions. ---DM Type 2 HTN Pacemaker Guidelines Guideline Title Affirmed Question Affirmed Notes Nurse Date/

## 2014-06-15 NOTE — Progress Notes (Signed)
Pt refused to go to [facility]. Order changed to admit to [facility], telemetry. Leisa Lenz, MD [phone]

## 2014-06-15 NOTE — Progress Notes (Signed)
ANTICOAGULATION CONSULT NOTE - Initial Consult  Pharmacy Consult for warfarin Indication: atrial fibrillation  Allergies  Allergen Reactions  . Other Other (See Comments)    Plastic tape tears skin  . Bactrim Ds [Sulfamethoxazole-Trimethoprim] Other (See Comments)    Hand tremors    Patient Measurements: Height: 5\' 8"  (172.7 cm) Weight: 171 lb 1.2 oz (77.6 kg) IBW/kg (Calculated) : 68.4  Vital Signs: Temp: 97.6 F (36.4 C) (04/13 1857) Temp Source: Oral (04/13 1857) BP: 158/55 mmHg (04/13 2036) Pulse Rate: 61 (04/13 2036)  Labs:  Recent Labs  06/15/14 1535  HGB 15.0  HCT 43.5  PLT 147*  LABPROT 26.2*  INR 2.39*  CREATININE 2.52*  TROPONINI <0.03    Estimated Creatinine Clearance: 23.4 mL/min (by C-G formula based on Cr of 2.52).   Medical History: Past Medical History  Diagnosis Date  . Gout   . Benign prostatic hypertrophy     takes Proscar daily  . Atrial fibrillation     takes Warfarin daily  . Chronic systolic dysfunction of left ventricle     a. mixed ischemic and nonischemic CM,  EF 35%. b. s/p AICD implantation.  . ED (erectile dysfunction)   . Arthritis   . Pacemaker     medtronic  . CAD (coronary artery disease)     a. s/p mid LAD stenting with DES 2008 Dr. Daneen Schick  . ICD (implantable cardiac defibrillator) in place     medtronic, Dr. Rayann Heman  . ICD (implantable cardiac defibrillator) in place   . Sleep apnea     hx of "had surgery for"  . Automatic implantable cardioverter-defibrillator in situ   . Depression   . Numbness and tingling     Hx; 89f left foot  . PAD (peripheral artery disease)     Severe by PV angiogram 09/2011  . Renal artery stenosis     s/p stenting 2009  . PAD (peripheral artery disease)     s/p multiple LLE bypass grafts; left mid-distal SCA occlusion by 08/2012 duplex  . Hypertension     takes Carvedilol and Losartan daily  . Constipation     takes Miralax daily as needed and Colace daily   . Type II diabetes  mellitus     takes Novolog 70/30  . CHF (congestive heart failure)     takes Lasix daily  . History of MRSA infection   . Insomnia     TAKES TRAZODONE NIGHTLY    Medications:  Scheduled:  . [START ON 06/16/2014] amLODipine  5 mg Oral Daily  . [START ON 06/16/2014] carvedilol  25 mg Oral BID WC  . [START ON 06/16/2014] finasteride  5 mg Oral Daily  . gabapentin  300 mg Oral TID  . [START ON 06/16/2014] insulin aspart protamine- aspart  7 Units Subcutaneous BID WC  . [START ON 06/16/2014] losartan  100 mg Oral Daily   Infusions:  . sodium chloride 75 mL/hr at 06/15/14 2033    Assessment: 80 yo male presents to ER with confusion and blurry vision seen by neurology at [facility]. TIA ruled ou by CT. PMH includes PVD, Afib, HTN, DM, CHF, depression, gout and CAD. To continue warfarin per pharmacy dosing for hx Afib. Patient reportedly takes warfarin 2.5mg  qpm PTA. Last dose 4/13. INR therapeutic on admission  Goal of Therapy:  INR 2-3   Plan:  1) Patient already took warfarin today and INR is therapeutic so will not order any further doses for tonight 2) Daily INR  Adrian Saran, PharmD, BCPS Pager [phone] 06/15/2014 8:58 PM

## 2014-06-15 NOTE — Therapy (Signed)
[facility] [address] [place], [place], [address] Phone: [phone]   Fax:  [phone]  Physical Therapy Treatment  Patient Details  Name: [name] MRN: [id number] Date of Birth: 11-Aug-1934 Referring Provider:  Laurey Morale, MD  Encounter Date: 06/15/2014      PT End of Session - 06/15/14 0937    Visit Number 8   Number of Visits 17   Date for PT Re-Evaluation 07/15/04   PT Start Time 0932   PT Stop Time 1011   PT Time Calculation (min) 39 min   Equipment Utilized During Treatment Gait belt   Activity Tolerance Patient limited by fatigue   Behavior During Therapy [facility] for tasks assessed/performed      Past Medical History  Diagnosis Date  . Gout   . Benign prostatic hypertrophy     takes Proscar daily  . Atrial fibrillation     takes Warfarin daily  . Chronic systolic dysfunction of left ventricle     a. mixed ischemic and nonischemic CM,  EF 35%. b. s/p AICD implantation.  . ED (erectile dysfunction)   . Arthritis   . Pacemaker     medtronic  . CAD (coronary artery disease)     a. s/p mid LAD stenting with DES 2008 Dr. Daneen Schick  . ICD (implantable cardiac defibrillator) in place     medtronic, Dr. Rayann Heman  . ICD (implantable cardiac defibrillator) in place   . Sleep apnea     hx of "had surgery for"  . Automatic implantable cardioverter-defibrillator in situ   . Depression   . Numbness and tingling     Hx; 93f left foot  . PAD (peripheral artery disease)     Severe by PV angiogram 09/2011  . Renal artery stenosis     s/p stenting 2009  . PAD (peripheral artery disease)     s/p multiple LLE bypass grafts; left mid-distal SCA occlusion by 08/2012 duplex  . Hypertension     takes Carvedilol and Losartan daily  . Constipation     takes Miralax daily as needed and Colace daily   . Type II diabetes mellitus     takes Novolog 70/30  . CHF (congestive heart failure)     takes Lasix  daily  . History of MRSA infection   . Insomnia     TAKES TRAZODONE NIGHTLY    Past Surgical History  Procedure Laterality Date  . Turp vaporization    . Cardiac defibrillator placement  12/26/09    pacemaker combo  . Cervical epidural injection  2013  . Femoral-tibial bypass graft  09/25/2011    Procedure: BYPASS GRAFT FEMORAL-TIBIAL ARTERY;  Surgeon: Mal Misty, MD;  Location: [facility] OR;  Service: Vascular;  Laterality: Left;  Left Femoral - Anterior Tibial Bypass;  saphenous vein graft from left leg  . Intraoperative arteriogram  09/25/2011    Procedure: INTRA OPERATIVE ARTERIOGRAM;  Surgeon: Mal Misty, MD;  Location: [place];  Service: Vascular;  Laterality: Left;  . Femoral-tibial bypass graft  02/07/2012    Procedure: BYPASS GRAFT FEMORAL-TIBIAL ARTERY;  Surgeon: Rosetta Posner, MD;  Location: [place];  Service: Vascular;  Laterality: Left;  Thrombectomy Left Femoral - Tibial Bypass Graft  . Embolectomy  02/07/2012    Procedure: EMBOLECTOMY;  Surgeon: Rosetta Posner, MD;  Location: [place];  Service: Vascular;  Laterality: Left;  . Femoral-tibial bypass graft  04/03/2012    Procedure: BYPASS GRAFT FEMORAL-TIBIAL ARTERY;  Surgeon: Mal Misty, MD;  Location: [place];  Service: Vascular;  Laterality: Left;  Redo  . Insert / replace / remove pacemaker  2007  . Coronary angioplasty with stent placement  ~ 2000  . Coronary angioplasty    . Uvulopalatopharyngoplasty (uppp)/tonsillectomy/septoplasty  06/30/2003    Archie Endo 06/30/2003 (07/10/2012)  . Shoulder open rotator cuff repair Right 2001    repair of lacerated right/notes 10/11/1999  (07/10/2012)  . Foot surgery Right 03/20/2001    "have plates and screws in; didn't break it" (07/10/2012)  . Carpal tunnel release Right 2002    Archie Endo 03/20/2001 (07/10/2012)  . Biceps tendon repair Right 2001    Archie Endo 03/20/2001 (07/10/2012)  . Renal artery stent  2009  . Femoral-popliteal bypass graft Left 09/16/2012    Procedure: LEFT FEMORAL-POPLITEAL BYPASS GRAFT  WITH GORTEX Propaten Graft 6x80 Thin Wall and Left lower leg Angiogram;  Surgeon: Mal Misty, MD;  Location: [place];  Service: Vascular;  Laterality: Left;  . Colonoscopy      Hx; of  . Tonsillectomy    . Adenoidectomy      Hx; of   . Femoral-tibial bypass graft Left 09/30/2012    Procedure: REDO LEFT FEMORAL-ANTERIOR TIBIAL ARTERY BYPASS USING COMPOSITE CEPHALIC AND BASILIC VEIN GRAFT FROM LEFT ARM;  Surgeon: Mal Misty, MD;  Location: [facility];  Service: Vascular;  Laterality: Left;  . I&d extremity Left 10/21/2012    Procedure: EXPLORATION AND DEBRIDEMENT OF LEFT GROIN WOUND;  Surgeon: Mal Misty, MD;  Location: [place];  Service: Vascular;  Laterality: Left;  . Patch angioplasty Left 10/21/2012    Procedure: PATCH ANGIOPLASTY;  Surgeon: Mal Misty, MD;  Location: [place];  Service: Vascular;  Laterality: Left;  . Groin debridement Left 11/12/2012    Procedure: CLOSURE INGUINAL WOUND;  Surgeon: Mal Misty, MD;  Location: [place];  Service: Vascular;  Laterality: Left;  . Embolectomy Left 12/16/2012    Procedure: THROMBECTOMY  LEFT LEG BYPASS;  Surgeon: Elam Dutch, MD;  Location: [place];  Service: Vascular;  Laterality: Left;  . Leg amputation above knee Left 02/04/2013    DR LAWSON  . Amputation Left 02/04/2013    Procedure: AMPUTATION ABOVE KNEE-LEFT;  Surgeon: Mal Misty, MD;  Location: [place];  Service: Vascular;  Laterality: Left;  . Removal of graft Left 04/16/2013    Procedure: I & D LEFT AKA WOUND, POSSIBLE REMOVAL OF INFECTED GORTEX GRAFT;  Surgeon: Mal Misty, MD;  Location: [place];  Service: Vascular;  Laterality: Left;  . Abdominal aortagram N/A 09/11/2011    Procedure: ABDOMINAL Maxcine Ham;  Surgeon: Wellington Hampshire, MD;  Location: [facility] CATH LAB;  Service: Cardiovascular;  Laterality: N/A;  . Lower extremity angiogram Left 12/25/2011    Procedure: LOWER EXTREMITY ANGIOGRAM;  Surgeon: Serafina Mitchell, MD;  Location: [facility] CATH LAB;  Service: Cardiovascular;   Laterality: Left;  lt leg angio co2  . Abdominal angiogram  12/25/2011    Procedure: ABDOMINAL ANGIOGRAM;  Surgeon: Serafina Mitchell, MD;  Location: [facility] CATH LAB;  Service: Cardiovascular;;  . Lower extremity angiogram N/A 07/07/2012    Procedure: LOWER EXTREMITY ANGIOGRAM;  Surgeon: Conrad Cooke, MD;  Location: [facility] CATH LAB;  Service: Cardiovascular;  Laterality: N/A;  . Lower extremity angiogram N/A 09/15/2012    Procedure: LOWER EXTREMITY ANGIOGRAM;  Surgeon: Serafina Mitchell, MD;  Location: [facility] CATH LAB;  Service: Cardiovascular;  Laterality: N/A;  . Upper extremity angiogram  09/15/2012  Procedure: UPPER EXTREMITY ANGIOGRAM;  Surgeon: Serafina Mitchell, MD;  Location: [facility] CATH LAB;  Service: Cardiovascular;;  . Biv icd genertaor change out N/A 11/09/2013    Procedure: BIV ICD GENERTAOR CHANGE OUT;  Surgeon: Coralyn Mark, MD;  Location: [facility] CATH LAB;  Service: Cardiovascular;  Laterality: N/A;    There were no vitals filed for this visit.  Visit Diagnosis:  Abnormality of gait  Impaired functional mobility and activity tolerance  Weakness generalized  Activity intolerance      Subjective Assessment - 06/15/14 0935    Subjective No falls to report. Hands are a little better. Still having issued with prosthesis coming off.   Currently in Pain? Yes   Pain Score 5    Pain Location Hand   Pain Orientation Left   Pain Descriptors / Indicators Aching;Sore   Pain Type Chronic pain   Pain Onset More than a month ago   Pain Frequency Constant   Aggravating Factors  increased used of hands   Pain Relieving Factors medicine, rest          [facility] Adult PT Treatment/Exercise - 06/15/14 0938    Transfers   Sit to Stand 5: Supervision;With upper extremity assist;With armrests;From chair/3-in-1   Stand to Sit 5: Supervision;With upper extremity assist;With armrests;To chair/3-in-1   Ambulation/Gait   Ambulation/Gait Yes   Ambulation/Gait Assistance 5: Supervision   Ambulation/Gait Assistance  Details multimodal cues on increased step length on right, correct technique for advancing prosthesis and on posture.                     Ambulation Distance (Feet) 104 Feet  x 2, 60 feet x 2 reps   Assistive device Rolling walker;Prosthesis   Gait Pattern Step-through pattern;Decreased stride length;Decreased stance time - left;Decreased step length - right;Poor foot clearance - left;Trunk flexed;Decreased trunk rotation   Ambulation Surface Level;Indoor   Ramp 4: Min assist  with walker/prosthesis   Ramp Details (indicate cue type and reason) cues on sequence, technique and walker position    Curb 4: Min assist  with walker/prosthesis   Curb Details (indicate cue type and reason) cues on stance, prosthetic position and sequence   Prosthetics   Current prosthetic wear tolerance (days/week)  daily  for most part, not yesterday due to feeling bad   Current prosthetic wear tolerance (#hours/day)  >/= 90% of awake hours   Residual limb condition  intact per pt report   Education Provided Residual limb care;Proper wear schedule/adjustment;Proper weight-bearing schedule/adjustment   Person(s) Educated Patient   Education Method Explanation   Education Method Verbalized understanding   Donning Prosthesis Supervision   Doffing Prosthesis Modified independent (device/increased time)      165/78 right arm automated machine after session. BP checked due to pt feeling weak/tired and he did not self check at home today.        PT Short Term Goals - 06/15/14 2059    PT SHORT TERM GOAL #1   Title donnes prosthesis with new suspension correctly independently. (Target Date: 06/17/14)   Status Achieved   PT SHORT TERM GOAL #2   Title demonstrates understanding of initial HEP. (Target Date: 06/17/14)   Status Achieved   PT SHORT TERM GOAL #3   Title wears prosthesis >70% of awake hours >/= 5 days /wk. (Target Date: 06/17/14)   Status Achieved   PT SHORT TERM GOAL #4   Title ambulates 100' with  rolling walker & prosthesis with cues only for deviations. (  Target Date: 06/17/14)   Status Achieved   PT SHORT TERM GOAL #5   Title stands 30sec without UE support with supervision. (Target Date: 06/17/14)   Status On-going           PT Long Term Goals - 05/18/14 0845    PT LONG TERM GOAL #1   Title wears prosthesis >80% of awake hours daily. (Target Date: 07/15/14)   Time 8   Period Weeks   Status New   PT LONG TERM GOAL #2   Title verbalize understanding of ongoing fitness plan. (Target Date: 07/15/14)   Time 8   Period Weeks   Status New   PT LONG TERM GOAL #3   Title ambulates 200' with rolling walker & prosthesis modified independent. (Target Date: 07/15/14)   Time 8   Period Weeks   Status New   PT LONG TERM GOAL #4   Title Berg Balance >18/56 (Target Date: 07/15/14)   Time 8   Period Weeks   Status New   PT LONG TERM GOAL #5   Title negotiate ramp, curb & stairs (1 rail) with rolling walker & prosthesis modified independent. (Target Date: 07/15/14)   Time 8   Period Weeks   Status New           Plan - 06/15/14 5462    Clinical Impression Statement Pt has met most STG's today. Progressing toward other goals. Limited by feeling weak/no energy today. Still needs cues to use/engage prosthetic knee with mobility.   Pt will benefit from skilled therapeutic intervention in order to improve on the following deficits Abnormal gait;Cardiopulmonary status limiting activity;Decreased activity tolerance;Decreased balance;Decreased endurance;Decreased knowledge of use of DME;Decreased mobility;Decreased range of motion;Decreased strength;Pain;Other (comment)  prosthetic dependency   Rehab Potential Good   PT Frequency 2x / week   PT Duration 8 weeks   PT Treatment/Interventions ADLs/Self Care Home Management;DME Instruction;Gait training;Stair training;Functional mobility training;Therapeutic activities;Therapeutic exercise;Balance training;Neuromuscular  re-education;Patient/family education;Other (comment)  prosthetic training   PT Next Visit Plan  strength including core, endurance, Prosthetic training in gait, barriers with walker   Consulted and Agree with Plan of Care Patient        Problem List Patient Active Problem List   Diagnosis Date Noted  . CVA (cerebral infarction) 06/15/2014  . Expressive aphasia 06/15/2014  . CKD (chronic kidney disease) stage 4, GFR 15-29 ml/min 06/15/2014  . DM (diabetes mellitus), type 2, uncontrolled, with renal complications 70/35/0093  . Unilateral AKA 02/09/2013  . S/P ICD (internal cardiac defibrillator) procedure 05/18/2012  . PAD (peripheral artery disease) 09/04/2011  . CAD (coronary artery disease)   . Chronic systolic congestive heart failure 07/06/2010  . Gout 09/18/2007  . Essential hypertension 01/23/2007  . Atrial fibrillation 01/23/2007  . BPH (benign prostatic hyperplasia) 01/23/2007    Willow Ora 06/15/2014, 9:04 PM  Willow Ora, PTA, [facility] [address] [place], [place] [address] [phone] 06/15/2014, 9:04 PM

## 2014-06-15 NOTE — H&P (Addendum)
[facility] History and Physical  [name] GBT:[id number] DOB: [birth year] DOA: 06/15/2014  Referring physician: ER physician PCP: Laurey Morale, MD   Chief Complaint: confusion, difficulty writing and speaking   HPI:  80 year old male with past medical history of chronic systolic CHF (last 2 D ECHO in 11/2013 with EF 55%), hypertension, diabetes, CKD stage 4 with baseline creatinine of 2.1, atrial fibrillation on AC with coumadin, S/P AICD placement, PVD with left above knee amputation, previous history of CVA who presented to [facility] ED with sudden onset difficulty writing and speaking which subsequently progressed to confusion. Pt reported he was writing and has had difficult time to do so but once he completed writing who could barely understand it. No reports of upper or lower extremity weakness. No facial droop but speech as mentioned was somewhat difficult and slurred. No numbness nad tingling, no sensation loss. This all started about a day prior to this admission. No sweating, no lightheadedness or loss of consciousness. No chest pain, shortness of breath or palpitations. No fevers, no cough. No abdominal pain, nausea or vomiting. No blood in stool or urine.   In ED, pt was hemodynamically stable. His symptoms seem to have resolved at this point. He does appear to have somewhat of a slow speech but is oriented to time, place and person. Stroke work up initiated but unable to have MRI since pt has AICD. CT head did not show acute intracranial findings but there was an evidence of old CVA in left basal ganglia and internal capsule. Neuro requested transfer to [facility] but pt declined. In regards to blood work, platelets were 142, creatinine 2.52 (baseline 2.1), normal troponin level  Assessment & Plan    Principal Problem:   Expressive aphasia, dysgraphia / Acute encephalopathy / Confusion / TIA versus CVA / History of CVA in left basal ganglia and internal capsule territory Stroke work  up initiated: - On anticoagulation with coumadin, INR therapeutic - MRI brain cant be done since pt has AICD - Follow up 2D ECHO results  - Follow up Carotid doppler results  - Follow up HgbA1c, Lipid panel - VTE prophylaxis: AC with coumadin - Diet: passed swallow screen; heart healthy, diabetic diet (pt requested regular so changed to regular) - Therapy: PT/OT - order placed  Hyperlipidemia  - LDL ; LDL goal < 100  - Patient not on lipid lowering agent; will add per neurology  Other Stroke Risk Factors  - Advanced age - Hx stroke in past, left basal ganglia and internal capsule territory - HTN, Diabetes   Active Problems:   Gout - Resume allopurinol     Essential hypertension - Continue norvasc, coreg, losartan    Atrial fibrillation, chronic - CHADS vasc score 8 - On anticoagulation with coumadin - Rate controlled with coreg 25 mg PO BID    BPH (benign prostatic hyperplasia) - Continue proscar    Chronic systolic congestive heart failure - Last 2 D ECHO on file 11/2013 with EF 55%. Currently CHF compensated. - As part of stroke work up order placed for 2 D ECHO - Has AICD in place - Held lasix due to worsening renal function - May resume coreg, losartan, norvasc    CAD (coronary artery disease) - On anticoagulation with coumadin - No complaints of chest pain - Normal troponin on admission    PAD (peripheral artery disease) / Diabetic neuropathy - Continue gabapentin     Thrombocytopenia - Unclear etiology - Mild, platelet 144 - Monitor  for bleeding       Unilateral AKA - stable    CKD (chronic kidney disease) stage 4, GFR 15-29 ml/min / Diabetic nephropathy - Baseline creatinine 2.1 and on this admission 2.52; possibly from lasix which was put on hold - Continue to monitor renal function daily    DM (diabetes mellitus), type 2, uncontrolled, with renal complications and neuropathic complications - Last T0Z in 04/2014, 8.7 indicating poor glycemic  control - Check A1c on this admission - Resume insulin regime per home dose - Resumed gabapentin for neuropathy   DVT prophylaxis:  - On anticoagulation with coumadin     Radiological Exams on Admission: Dg Chest 2 View 06/15/2014   Enlargement of cardiac silhouette post AICD placement.  No acute abnormalities.    Ct Head Wo Contrast 06/15/2014  No acute finding by CT. Chronic small vessel change of the hemispheric white matter. 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      EKG: junctional rhythm, RBBB  Code Status: Full Family Communication: Plan of care discussed with the patient  Disposition Plan: Admit for further evaluation, telemetry unit   Leisa Lenz, MD  [facility] Pager [phone]  Time spent in minutes: 75 minutes  Review of Systems:  Constitutional: Negative for fever, chills and malaise/fatigue. Negative for diaphoresis.  HENT: Negative for hearing loss, ear pain, nosebleeds, congestion, sore throat, neck pain, tinnitus and ear discharge.   Eyes: Negative for blurred vision, double vision, photophobia, pain, discharge and redness.  Respiratory: Negative for cough, hemoptysis, sputum production, shortness of breath, wheezing and stridor.   Cardiovascular: Negative for chest pain, palpitations, orthopnea, claudication and leg swelling.  Gastrointestinal: Negative for nausea, vomiting and abdominal pain. Negative for heartburn, constipation, blood in stool and melena.  Genitourinary: Negative for dysuria, urgency, frequency, hematuria and flank pain.  Musculoskeletal: Negative for myalgias, back pain, joint pain and falls.  Skin: Negative for itching and rash.  Neurological: per HPI  Endo/Heme/Allergies: Negative for environmental allergies and polydipsia. Does not bruise/bleed easily.  Psychiatric/Behavioral: Negative for suicidal ideas. The patient is not nervous/anxious.      Past Medical History  Diagnosis Date  . Gout   . Benign  prostatic hypertrophy     takes Proscar daily  . Atrial fibrillation     takes Warfarin daily  . Chronic systolic dysfunction of left ventricle     a. mixed ischemic and nonischemic CM,  EF 35%. b. s/p AICD implantation.  . ED (erectile dysfunction)   . Arthritis   . Pacemaker     medtronic  . CAD (coronary artery disease)     a. s/p mid LAD stenting with DES 2008 Dr. Daneen Schick  . ICD (implantable cardiac defibrillator) in place     medtronic, Dr. Rayann Heman  . ICD (implantable cardiac defibrillator) in place   . Sleep apnea     hx of "had surgery for"  . Automatic implantable cardioverter-defibrillator in situ   . Depression   . Numbness and tingling     Hx; 70f left foot  . PAD (peripheral artery disease)     Severe by PV angiogram 09/2011  . Renal artery stenosis     s/p stenting 2009  . PAD (peripheral artery disease)     s/p multiple LLE bypass grafts; left mid-distal SCA occlusion by 08/2012 duplex  . Hypertension     takes Carvedilol and Losartan daily  . Constipation     takes Miralax daily as needed and Colace daily   .  Type II diabetes mellitus     takes Novolog 70/30  . CHF (congestive heart failure)     takes Lasix daily  . History of MRSA infection   . Insomnia     TAKES TRAZODONE NIGHTLY   Past Surgical History  Procedure Laterality Date  . Turp vaporization    . Cardiac defibrillator placement  12/26/09    pacemaker combo  . Cervical epidural injection  2013  . Femoral-tibial bypass graft  09/25/2011    Procedure: BYPASS GRAFT FEMORAL-TIBIAL ARTERY;  Surgeon: Mal Misty, MD;  Location: [facility] OR;  Service: Vascular;  Laterality: Left;  Left Femoral - Anterior Tibial Bypass;  saphenous vein graft from left leg  . Intraoperative arteriogram  09/25/2011    Procedure: INTRA OPERATIVE ARTERIOGRAM;  Surgeon: Mal Misty, MD;  Location: [place];  Service: Vascular;  Laterality: Left;  . Femoral-tibial bypass graft  02/07/2012    Procedure: BYPASS GRAFT  FEMORAL-TIBIAL ARTERY;  Surgeon: Rosetta Posner, MD;  Location: [place];  Service: Vascular;  Laterality: Left;  Thrombectomy Left Femoral - Tibial Bypass Graft  . Embolectomy  02/07/2012    Procedure: EMBOLECTOMY;  Surgeon: Rosetta Posner, MD;  Location: [place];  Service: Vascular;  Laterality: Left;  . Femoral-tibial bypass graft  04/03/2012    Procedure: BYPASS GRAFT FEMORAL-TIBIAL ARTERY;  Surgeon: Mal Misty, MD;  Location: [place];  Service: Vascular;  Laterality: Left;  Redo  . Insert / replace / remove pacemaker  2007  . Coronary angioplasty with stent placement  ~ 2000  . Coronary angioplasty    . Uvulopalatopharyngoplasty (uppp)/tonsillectomy/septoplasty  06/30/2003    Archie Endo 06/30/2003 (07/10/2012)  . Shoulder open rotator cuff repair Right 2001    repair of lacerated right/notes 10/11/1999  (07/10/2012)  . Foot surgery Right 03/20/2001    "have plates and screws in; didn't break it" (07/10/2012)  . Carpal tunnel release Right 2002    Archie Endo 03/20/2001 (07/10/2012)  . Biceps tendon repair Right 2001    Archie Endo 03/20/2001 (07/10/2012)  . Renal artery stent  2009  . Femoral-popliteal bypass graft Left 09/16/2012    Procedure: LEFT FEMORAL-POPLITEAL BYPASS GRAFT WITH GORTEX Propaten Graft 6x80 Thin Wall and Left lower leg Angiogram;  Surgeon: Mal Misty, MD;  Location: [place];  Service: Vascular;  Laterality: Left;  . Colonoscopy      Hx; of  . Tonsillectomy    . Adenoidectomy      Hx; of   . Femoral-tibial bypass graft Left 09/30/2012    Procedure: REDO LEFT FEMORAL-ANTERIOR TIBIAL ARTERY BYPASS USING COMPOSITE CEPHALIC AND BASILIC VEIN GRAFT FROM LEFT ARM;  Surgeon: Mal Misty, MD;  Location: [place];  Service: Vascular;  Laterality: Left;  . I&d extremity Left 10/21/2012    Procedure: EXPLORATION AND DEBRIDEMENT OF LEFT GROIN WOUND;  Surgeon: Mal Misty, MD;  Location: [place];  Service: Vascular;  Laterality: Left;  . Patch angioplasty Left 10/21/2012    Procedure: PATCH ANGIOPLASTY;  Surgeon:  Mal Misty, MD;  Location: [place];  Service: Vascular;  Laterality: Left;  . Groin debridement Left 11/12/2012    Procedure: CLOSURE INGUINAL WOUND;  Surgeon: Mal Misty, MD;  Location: [place];  Service: Vascular;  Laterality: Left;  . Embolectomy Left 12/16/2012    Procedure: THROMBECTOMY  LEFT LEG BYPASS;  Surgeon: Elam Dutch, MD;  Location: [place];  Service: Vascular;  Laterality: Left;  . Leg amputation above knee Left 02/04/2013  DR Kellie Simmering  . Amputation Left 02/04/2013    Procedure: AMPUTATION ABOVE KNEE-LEFT;  Surgeon: Mal Misty, MD;  Location: [place];  Service: Vascular;  Laterality: Left;  . Removal of graft Left 04/16/2013    Procedure: I & D LEFT AKA WOUND, POSSIBLE REMOVAL OF INFECTED GORTEX GRAFT;  Surgeon: Mal Misty, MD;  Location: [place];  Service: Vascular;  Laterality: Left;  . Abdominal aortagram N/A 09/11/2011    Procedure: ABDOMINAL Maxcine Ham;  Surgeon: Wellington Hampshire, MD;  Location: [facility] CATH LAB;  Service: Cardiovascular;  Laterality: N/A;  . Lower extremity angiogram Left 12/25/2011    Procedure: LOWER EXTREMITY ANGIOGRAM;  Surgeon: Serafina Mitchell, MD;  Location: [facility] CATH LAB;  Service: Cardiovascular;  Laterality: Left;  lt leg angio co2  . Abdominal angiogram  12/25/2011    Procedure: ABDOMINAL ANGIOGRAM;  Surgeon: Serafina Mitchell, MD;  Location: [facility] CATH LAB;  Service: Cardiovascular;;  . Lower extremity angiogram N/A 07/07/2012    Procedure: LOWER EXTREMITY ANGIOGRAM;  Surgeon: Conrad [place], MD;  Location: [facility] CATH LAB;  Service: Cardiovascular;  Laterality: N/A;  . Lower extremity angiogram N/A 09/15/2012    Procedure: LOWER EXTREMITY ANGIOGRAM;  Surgeon: Serafina Mitchell, MD;  Location: [facility] CATH LAB;  Service: Cardiovascular;  Laterality: N/A;  . Upper extremity angiogram  09/15/2012    Procedure: UPPER EXTREMITY ANGIOGRAM;  Surgeon: Serafina Mitchell, MD;  Location: [facility] CATH LAB;  Service: Cardiovascular;;  . Biv icd genertaor change out N/A 11/09/2013     Procedure: BIV ICD GENERTAOR CHANGE OUT;  Surgeon: Coralyn Mark, MD;  Location: [facility] CATH LAB;  Service: Cardiovascular;  Laterality: N/A;   Social History:  reports that he has never smoked. He has never used smokeless tobacco. He reports that he drinks about 2.4 oz of alcohol per week. He reports that he does not use illicit drugs.  Allergies  Allergen Reactions  . Other Other (See Comments)    Plastic tape tears skin  . Bactrim Ds [Sulfamethoxazole-Trimethoprim] Other (See Comments)    Hand tremors    Family History:  Family History  Problem Relation Age of Onset  . Cancer      breast/fhx  . Heart disease Mother     fhx  . Diabetes Neg Hx   . Cancer Father      Prior to Admission medications   Medication Sig Start Date End Date Taking? Authorizing Provider  allopurinol (ZYLOPRIM) 100 MG tablet Take 1 tablet (100 mg total) by mouth daily as needed. Patient taking differently: Take 100 mg by mouth daily as needed (gout).  05/23/14  Yes Laurey Morale, MD  amLODipine (NORVASC) 5 MG tablet Take 5 mg by mouth daily.   Yes Historical Provider, MD  carvedilol (COREG) 25 MG tablet Take 1 tablet (25 mg total) by mouth 2 (two) times daily with a meal. 04/28/14  Yes Laurey Morale, MD  finasteride (PROSCAR) 5 MG tablet Take 1 tablet (5 mg total) by mouth daily. 04/28/14  Yes Laurey Morale, MD  furosemide (LASIX) 40 MG tablet Take 1 tablet (40 mg total) by mouth daily. 04/28/14  Yes Laurey Morale, MD  gabapentin (NEURONTIN) 300 MG capsule Take 1 capsule (300 mg total) by mouth 3 (three) times daily. 05/05/14  Yes Laurey Morale, MD  glucose blood (ONE TOUCH ULTRA TEST) test strip use to test blood sugar two times daily as instructed by physician. 09/20/13  Yes Laurey Morale, MD  insulin  aspart protamine- aspart (NOVOLOG MIX 70/30) (70-30) 100 UNIT/ML injection Inject 7 Units into the skin 2 (two) times daily with a meal. 03/03/13  Yes Timoteo Gaul, FNP  Insulin Pen Needle 31G X 8 MM MISC Use  2-3 times per day and diagnosis code is 250.00 11/25/13  Yes Laurey Morale, MD  losartan (COZAAR) 100 MG tablet Take 1 tablet (100 mg total) by mouth daily. 05/23/14  Yes Laurey Morale, MD  oxyCODONE-acetaminophen (PERCOCET) 7.5-325 MG per tablet Take 1 tablet by mouth every 6 (six) hours as needed for pain. 06/03/14  Yes Meredith Staggers, MD  warfarin (COUMADIN) 5 MG tablet Take as directed by anticoagulation clinic Patient taking differently: Take 2.5 mg by mouth daily at 6 PM. Take as directed by anticoagulation clinic 04/28/14  Yes Laurey Morale, MD  nitroGLYCERIN (NITROSTAT) 0.4 MG SL tablet Place 1 tablet (0.4 mg total) under the tongue every 5 (five) minutes as needed for chest pain. For chest pain, max 3 doses Patient not taking: Reported on 06/15/2014 05/28/13   Laurey Morale, MD  oxybutynin (DITROPAN-XL) 10 MG 24 hr tablet Take 1 tablet (10 mg total) by mouth at bedtime. 02/07/14   Laurey Morale, MD  predniSONE (DELTASONE) 20 MG tablet Take 0.5 tablets (10 mg total) by mouth as directed. Take 1 tab 3x daily for 4 days then 1 tab 2x daily for 4 days then 1 tab once daily for 4 days, then 1/2 tab daily for 4 days and off. 06/03/14   Meredith Staggers, MD  topiramate (TOPAMAX) 25 MG tablet Take 1 tablet (25 mg total) by mouth at bedtime. 06/03/14   Meredith Staggers, MD   Physical Exam: Filed Vitals:   06/15/14 1540 06/15/14 1619 06/15/14 1619 06/15/14 1712  BP: 95/63 97/64 90/63  106/66  Pulse: 61 71 60 60  Temp:      TempSrc:      Resp: 22 18 18 18   SpO2: 100% 98% 98% 100%    Physical Exam  Constitutional: Appears well-developed and well-nourished. No distress.  HENT: Normocephalic. No tonsillar erythema or exudates Eyes: Conjunctivae and EOM are normal. PERRLA, no scleral icterus.  Neck: Normal ROM. Neck supple. No JVD. No tracheal deviation. No thyromegaly.  CVS: RRR, S1/S2 +, no murmurs, no gallops, no carotid bruit.  Pulmonary: Effort and breath sounds normal, no stridor, rhonchi, wheezes,  rales.  Abdominal: Soft. BS +,  no distension, tenderness, rebound or guarding.  Musculoskeletal: Left AKA, no edema and no tenderness in RLE.  Lymphadenopathy: No lymphadenopathy noted, cervical, inguinal. Neuro: Alert. Normal reflexes, muscle tone coordination. No focal neurologic deficits. Skin: Skin is warm and dry. No rash noted.  No erythema. No pallor.  Psychiatric: Normal mood and affect. Behavior, judgment, thought content normal.   Labs on Admission:  Basic Metabolic Panel:  Recent Labs Lab 06/15/14 1535  NA 136  K 5.0  CL 105  CO2 21  GLUCOSE 161*  BUN 59*  CREATININE 2.52*  CALCIUM 8.5   Liver Function Tests:  Recent Labs Lab 06/15/14 1535  AST 27  ALT 24  ALKPHOS 53  BILITOT 1.5*  PROT 6.7  ALBUMIN 3.7   No results for input(s): LIPASE, AMYLASE in the last 168 hours. No results for input(s): AMMONIA in the last 168 hours. CBC:  Recent Labs Lab 06/15/14 1535  WBC 9.3  NEUTROABS 6.7  HGB 15.0  HCT 43.5  MCV 86.0  PLT 147*   Cardiac Enzymes:  Recent  Labs Lab 06/15/14 1535  TROPONINI <0.03   BNP: Invalid input(s): POCBNP CBG: No results for input(s): GLUCAP in the last 168 hours.  If 7PM-7AM, please contact night-coverage [email or web address] Password [facility] 06/15/2014, 5:28 PM

## 2014-06-16 DIAGNOSIS — I6789 Other cerebrovascular disease: Secondary | ICD-10-CM

## 2014-06-16 DIAGNOSIS — I5022 Chronic systolic (congestive) heart failure: Secondary | ICD-10-CM

## 2014-06-16 DIAGNOSIS — N400 Enlarged prostate without lower urinary tract symptoms: Secondary | ICD-10-CM

## 2014-06-16 LAB — BASIC METABOLIC PANEL
Anion gap: 7 (ref 5–15)
BUN: 49 mg/dL — AB (ref 6–23)
CO2: 23 mmol/L (ref 19–32)
Calcium: 8 mg/dL — ABNORMAL LOW (ref 8.4–10.5)
Chloride: 110 mmol/L (ref 96–112)
Creatinine, Ser: 1.91 mg/dL — ABNORMAL HIGH (ref 0.50–1.35)
GFR calc non Af Amer: 32 mL/min — ABNORMAL LOW (ref >90)
GFR, EST AFRICAN AMERICAN: 37 mL/min — AB (ref >90)
GLUCOSE: 122 mg/dL — AB (ref 70–99)
Potassium: 4.5 mmol/L (ref 3.5–5.1)
Sodium: 140 mmol/L (ref 135–145)

## 2014-06-16 LAB — URINE CULTURE
COLONY COUNT: NO GROWTH
CULTURE: NO GROWTH

## 2014-06-16 LAB — LIPID PANEL
Cholesterol: 230 mg/dL — ABNORMAL HIGH (ref 0–200)
HDL: 35 mg/dL — AB (ref >39)
LDL Cholesterol: 154 mg/dL — ABNORMAL HIGH (ref 0–99)
TRIGLYCERIDES: 204 mg/dL — AB (ref <150)
Total CHOL/HDL Ratio: 6.6 RATIO
VLDL: 41 mg/dL — ABNORMAL HIGH (ref 0–40)

## 2014-06-16 LAB — PROTIME-INR
INR: 2.21 — AB (ref 0.00–1.49)
PROTHROMBIN TIME: 24.7 s — AB (ref 11.6–15.2)

## 2014-06-16 LAB — GLUCOSE, CAPILLARY
GLUCOSE-CAPILLARY: 128 mg/dL — AB (ref 70–99)
Glucose-Capillary: 130 mg/dL — ABNORMAL HIGH (ref 70–99)

## 2014-06-16 LAB — MRSA PCR SCREENING: MRSA BY PCR: NEGATIVE

## 2014-06-16 MED ORDER — FUROSEMIDE 20 MG PO TABS: 20.0000 mg | ORAL_TABLET | Freq: Every day | ORAL | Status: DC

## 2014-06-16 MED ORDER — WARFARIN SODIUM 2.5 MG PO TABS: 2.5000 mg | ORAL_TABLET | Freq: Every day | ORAL | Status: DC

## 2014-06-16 NOTE — Evaluation (Signed)
SLP Cancellation Note  Patient Details Name: [name] MRN: [id number] DOB: Aug 22, 1934   Cancelled treatment:       Reason Eval/Treat Not Completed: SLP screened, no needs identified, will sign off   Luanna Salk, [facility] SLP [phone]

## 2014-06-16 NOTE — Evaluation (Signed)
Physical Therapy Evaluation Patient Details Name: [name] MRN: [id number] DOB: Jun 05, 1934 Today's Date: 06/16/2014   History of Present Illness  Pt is 80 year old male with past medical history of chronic systolic CHF, hypertension, diabetes, CKD stage 4, atrial fibrillation, S/P AICD placement, PVD with left above knee amputation, previous history of CVA who presented to [facility] ED with sudden onset difficulty writing and speaking which subsequently progressed to confusion. Admitted with Expressive aphasia, dysgraphia and Acute encephalopathy.    Clinical Impression  Pt admitted with above and currently with functional limitations due to the deficits listed below (see PT Problem list). Pt ambulated 80 ft and was fatigued by the end. Pt will benefit from skilled PT to increase their independence and safety with mobility to allow discharge home. Recommending that pt continues with Outpatient PT.     Follow Up Recommendations Outpatient PT    Equipment Recommendations  None recommended by PT    Recommendations for Other Services       Precautions / Restrictions Precautions Precautions: Fall Precaution Comments: L LE prosthesis Restrictions Weight Bearing Restrictions: No      Mobility  Bed Mobility Overal bed mobility: Needs Assistance Bed Mobility: Supine to Sit     Supine to sit: Supervision     General bed mobility comments: supervision for safety  Transfers Overall transfer level: Needs assistance Equipment used: Rolling walker (2 wheeled) Transfers: Sit to/from Stand Sit to Stand: Supervision         General transfer comment: supervision for safety, verbal cues given for hand placement  Ambulation/Gait Ambulation/Gait assistance: Min guard Ambulation Distance (Feet): 80 Feet Assistive device: Rolling walker (2 wheeled) Gait Pattern/deviations: Step-to pattern;Trunk flexed     General Gait Details: verbal cues given for RW distance. pt was min guard for  safety. pt fatigued during end of ambulation.   Stairs            Wheelchair Mobility    Modified Rankin (Stroke Patients Only)       Balance                                             Pertinent Vitals/Pain Pain Assessment: No/denies pain    Home Living Family/patient expects to be discharged to:: Private residence Living Arrangements: Alone   Type of Home: House Home Access: Level entry     Home Layout: One [place]: Environmental consultant - 2 wheels;Wheelchair - manual      Prior Function Level of Independence: Independent with assistive device(s)         Comments: pt uses wheelchair for most mobility and usually only ambulates during outpatient therapy.      Hand Dominance        Extremity/Trunk Assessment   Upper Extremity Assessment: Overall WFL for tasks assessed;LUE deficits/detail       LUE Deficits / Details: pt reports of tingling sensation in L hand   Lower Extremity Assessment: LLE deficits/detail   LLE Deficits / Details: hx of above knee amputation w/prosthetic      Communication   Communication: No difficulties  Cognition Arousal/Alertness: Awake/alert Behavior During Therapy: WFL for tasks assessed/performed Overall Cognitive Status: Within Functional Limits for tasks assessed                      General Comments      Exercises  Assessment/Plan    PT Assessment Patient needs continued PT services  PT Diagnosis Difficulty walking   PT Problem List Decreased activity tolerance;Decreased mobility  PT Treatment Interventions DME instruction;Gait training;Functional mobility training;Therapeutic exercise;Patient/family education;Therapeutic activities   PT Goals (Current goals can be found in the Care Plan section) Acute Rehab PT Goals PT Goal Formulation: With patient Time For Goal Achievement: 06/23/14 Potential to Achieve Goals: Good    Frequency Min 3X/week   Barriers to discharge         Co-evaluation               End of Session Equipment Utilized During Treatment: Gait belt Activity Tolerance: Patient tolerated treatment well Patient left: in chair;with call bell/phone within reach Nurse Communication: Mobility status         Time: 1100-1113 PT Time Calculation (min) (ACUTE ONLY): 13 min   Charges:   PT Evaluation $Initial PT Evaluation Tier I: 1 Procedure     PT G Codes:        Jayen Bromwell 06/16/2014, 1:37 PM Charlott Holler, SPT

## 2014-06-16 NOTE — Progress Notes (Signed)
*  PRELIMINARY RESULTS* Vascular Ultrasound Carotid Duplex (Doppler) has been completed.   Findings suggest 1-39% internal carotid artery stenosis bilaterally. Vertebral arteries are patent with antegrade flow.  06/16/2014 9:55 AM Maudry Mayhew, RVT, RDCS, RDMS

## 2014-06-16 NOTE — Discharge Summary (Signed)
Physician Discharge Summary  [name]:[id number] DOB: 1935/02/28 DOA: 06/15/2014  PCP: Laurey Morale, MD  Admit date: 06/15/2014 Discharge date: 06/16/2014  Recommendations for Outpatient Follow-up:  1. Please note we reduced lasix to 20 mg daily instead of 40 mg because your kidney function 2.39 and we held it on admission and kidney function improved. Please let know your PCP of this change.  Discharge Diagnoses:  Principal Problem:   Expressive aphasia Active Problems:   CVA (cerebral infarction)   Gout   Essential hypertension   Atrial fibrillation   BPH (benign prostatic hyperplasia)   Chronic systolic congestive heart failure   CAD (coronary artery disease)   PAD (peripheral artery disease)   S/P ICD (internal cardiac defibrillator) procedure   Unilateral AKA   CKD (chronic kidney disease) stage 4, GFR 15-29 ml/min   DM (diabetes mellitus), type 2, uncontrolled, with renal complications    Discharge Condition: stable   Diet recommendation: as tolerated   History of present illness:  80 year old male with past medical history of chronic systolic CHF (last 2 D ECHO in 11/2013 with EF 55%), hypertension, diabetes, CKD stage 4 with baseline creatinine of 2.1, atrial fibrillation on AC with coumadin, S/P AICD placement, PVD with left above knee amputation, previous history of CVA who presented to [facility] ED with sudden onset difficulty writing and speaking which subsequently progressed to confusion. Pt reported he was writing and has had difficult time to do so and once he completed writing who could barely understand it.   In ED, pt was hemodynamically stable. His symptoms seem to have resolved in ED.  Stroke work up initiated but unable to have MRI since pt has AICD. CT head did not show acute intracranial findings but there was an evidence of old CVA in left basal ganglia and internal capsule. Neuro requested transfer to [facility] but pt declined to be trasnferred.    Assessment & Plan    Principal Problem:  Expressive aphasia, dysgraphia / Acute encephalopathy / Confusion / TIA versus CVA / History of CVA in left basal ganglia and internal capsule territory Stroke work up in hospital: - On anticoagulation with coumadin, INR therapeutic - MRI brain cant be done since pt has AICD - 2D ECHO results - normal EF - Carotid doppler results - stable findings  - HgbA1c - pending, Lipid panel - LDL 154; pt said he will follow up with his PCP in regards to initiation of statin, declined to have it prescribed on this admission - VTE prophylaxis: AC with coumadin - Diet: passed swallow screen; heart healthy, diabetic diet (pt requested regular so changed to regular) - Therapy: PT/OT - no follow required  Hyperlipidemia  - LDL ; LDL goal < 100  - Patient not on lipid lowering agent. Declined to have statin therapy at this time. Other Stroke Risk Factors  - Advanced age - Hx stroke in past, left basal ganglia and internal capsule territory - HTN, Diabetes   Active Problems:  Gout - Continue allopurinol    Essential hypertension - Continue norvasc, losartan and coreg    Atrial fibrillation, chronic - CHADS vasc score 8 - On anticoagulation with coumadin - Continue coreg 25 mg PO BID for rate control   BPH (benign prostatic hyperplasia) - Continue proscar   Chronic systolic congestive heart failure - Last 2 D ECHO on file 11/2013 with EF 55%. Currently CHF compensated. - Has AICD in place - Held lasix due to worsening renal function; on  discharge may continue 20 mg instead of 40 mg due to renal insufficiency - Continue coreg, losartan, norvasc   CAD (coronary artery disease) - On anticoagulation with coumadin - Normal troponin on admission   PAD (peripheral artery disease) / Diabetic neuropathy - Continue gabapentin    Thrombocytopenia - Unclear etiology - Platelet 144 - No bleeding     Unilateral AKA, left AKA -  stable   CKD (chronic kidney disease) stage 4, GFR 15-29 ml/min / Diabetic nephropathy - Baseline creatinine 2.1 and on this admission 2.52; possibly from lasix which was put on hold - Creatinine 1.9 prior to discharge.    DM (diabetes mellitus), type 2, uncontrolled, with renal complications and neuropathic complications - Last T7D in 04/2014, 8.7 indicating poor glycemic control - A1c pending on this admission - Resume insulin regime per home dose on discharge  - Resumed gabapentin for neuropathy   DVT prophylaxis:  - On anticoagulation with coumadin; INR therapeutic  - Resume on discharge    Signed:  Leisa Lenz, MD  [facility] 06/16/2014, 3:12 PM  Pager #: [phone]  Time spent in minutes: less than 30 minutes   Consultations:  Neurology   Discharge Exam: Filed Vitals:   06/16/14 1444  BP: 133/58  Pulse: 63  Temp: 97.3 F (36.3 C)  Resp: 18   Filed Vitals:   06/16/14 0143 06/16/14 0411 06/16/14 0700 06/16/14 1444  BP: 142/67 132/61 135/68 133/58  Pulse: 65 60 59 63  Temp: 97.6 F (36.4 C) 97.5 F (36.4 C) 97.6 F (36.4 C) 97.3 F (36.3 C)  TempSrc: Oral Oral Oral Oral  Resp: 16 16 16 18   Height:      Weight:      SpO2: 99% 98% 100% 100%    General: Pt is alert, follows commands appropriately, not in acute distress Cardiovascular: Regular rate and rhythm, S1/S2 +, no murmurs Respiratory: Clear to auscultation bilaterally, no wheezing, no crackles, no rhonchi Abdominal: Soft, non tender, non distended, bowel sounds +, no guarding Extremities: no edema, left AKA Neuro: Grossly nonfocal  Discharge Instructions  Discharge Instructions    Call MD for:  difficulty breathing, headache or visual disturbances    Complete by:  As directed      Call MD for:  persistant nausea and vomiting    Complete by:  As directed      Call MD for:  severe uncontrolled pain    Complete by:  As directed      Diet - low sodium heart healthy    Complete  by:  As directed      Discharge instructions    Complete by:  As directed   1. Please note we reduced lasix to 20 mg daily instead of 40 mg because your kidney function 2.39 and we held it on admission and kidney function improved. Please let know your PCP of this change.     Increase activity slowly    Complete by:  As directed             Medication List    STOP taking these medications        oxybutynin 10 MG 24 hr tablet  Commonly known as:  DITROPAN-XL     predniSONE 20 MG tablet  Commonly known as:  DELTASONE      TAKE these medications        allopurinol 100 MG tablet  Commonly known as:  ZYLOPRIM  Take 1 tablet (100 mg total) by mouth daily  as needed.     amLODipine 5 MG tablet  Commonly known as:  NORVASC  Take 5 mg by mouth daily.     carvedilol 25 MG tablet  Commonly known as:  COREG  Take 1 tablet (25 mg total) by mouth 2 (two) times daily with a meal.     finasteride 5 MG tablet  Commonly known as:  PROSCAR  Take 1 tablet (5 mg total) by mouth daily.     furosemide 20 MG tablet  Commonly known as:  LASIX  Take 1 tablet (20 mg total) by mouth daily.     gabapentin 300 MG capsule  Commonly known as:  NEURONTIN  Take 1 capsule (300 mg total) by mouth 3 (three) times daily.     glucose blood test strip  Commonly known as:  ONE TOUCH ULTRA TEST  use to test blood sugar two times daily as instructed by physician.     insulin aspart protamine- aspart (70-30) 100 UNIT/ML injection  Commonly known as:  NOVOLOG MIX 70/30  Inject 7 Units into the skin 2 (two) times daily with a meal.     Insulin Pen Needle 31G X 8 MM Misc  Use 2-3 times per day and diagnosis code is 250.00     losartan 100 MG tablet  Commonly known as:  COZAAR  Take 1 tablet (100 mg total) by mouth daily.     nitroGLYCERIN 0.4 MG SL tablet  Commonly known as:  NITROSTAT  Place 1 tablet (0.4 mg total) under the tongue every 5 (five) minutes as needed for chest pain. For chest pain,  max 3 doses     oxyCODONE-acetaminophen 7.5-325 MG per tablet  Commonly known as:  PERCOCET  Take 1 tablet by mouth every 6 (six) hours as needed for pain.     topiramate 25 MG tablet  Commonly known as:  TOPAMAX  Take 1 tablet (25 mg total) by mouth at bedtime.     warfarin 5 MG tablet  Commonly known as:  COUMADIN  Take as directed by anticoagulation clinic           Follow-up Information    Follow up with FRY,STEPHEN A, MD. Schedule an appointment as soon as possible for a visit in 1 week.   Specialty:  Family Medicine   Why:  Follow up appt after recent hospitalization   Contact information:   [address] [place] [id number] [phone]        The results of significant diagnostics from this hospitalization (including imaging, microbiology, ancillary and laboratory) are listed below for reference.    Significant Diagnostic Studies: Dg Chest 2 View  06/15/2014   CLINICAL DATA:  Confusion, history atrial fibrillation, coronary artery disease, AICD, type 2 diabetes, CHF  EXAM: CHEST  2 VIEW  COMPARISON:  04/15/2013  FINDINGS: LEFT subclavian AICD with leads projecting at RIGHT atrium, RIGHT ventricle and coronary sinus.  Enlargement of cardiac silhouette.  Mediastinal contours and pulmonary vascularity normal.  Chronic elevation of LEFT diaphragm.  Minimal central peribronchial thickening.  No acute infiltrate, pleural effusion or pneumothorax.  Bones unremarkable.  IMPRESSION: Enlargement of cardiac silhouette post AICD placement.  No acute abnormalities.   Electronically Signed   By: Lavonia Dana M.D.   On: 06/15/2014 15:33   Ct Head Wo Contrast  06/15/2014   CLINICAL DATA:  Weakness. Confusion beginning yesterday. Speech disturbance. Language disturbance.  EXAM: CT HEAD WITHOUT CONTRAST  TECHNIQUE: Contiguous axial images were obtained from the base  of the skull through the vertex without intravenous contrast.  COMPARISON:  07/08/2012  FINDINGS: The brainstem and  cerebellum are unremarkable. The cerebral hemispheres show mild generalized atrophy. There is old infarction in the left basal ganglia and internal capsule region. There are mild chronic small-vessel changes of the deep white matter. No sign of acute infarction. No mass lesion, hemorrhage, hydrocephalus or extra-axial collection. No calvarial abnormality. Sinuses, middle ears and mastoids are clear. There is atherosclerotic calcification of the major vessels at the base of the brain.  IMPRESSION: No acute finding by CT. Chronic small vessel change of the hemispheric white matter. 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   Electronically Signed   By: Nelson Chimes M.D.   On: 06/15/2014 15:42    Microbiology: Recent Results (from the past 240 hour(s))  MRSA PCR Screening     Status: None   Collection Time: 06/15/14 10:57 PM  Result Value Ref Range Status   MRSA by PCR NEGATIVE NEGATIVE Final    Comment:        The GeneXpert MRSA Assay (FDA approved for NASAL specimens only), is one component of a comprehensive MRSA colonization surveillance program. It is not intended to diagnose MRSA infection nor to guide or monitor treatment for MRSA infections.      Labs: Basic Metabolic Panel:  Recent Labs Lab 06/15/14 1535 06/16/14 0628  NA 136 140  K 5.0 4.5  CL 105 110  CO2 21 23  GLUCOSE 161* 122*  BUN 59* 49*  CREATININE 2.52* 1.91*  CALCIUM 8.5 8.0*   Liver Function Tests:  Recent Labs Lab 06/15/14 1535  AST 27  ALT 24  ALKPHOS 53  BILITOT 1.5*  PROT 6.7  ALBUMIN 3.7   No results for input(s): LIPASE, AMYLASE in the last 168 hours. No results for input(s): AMMONIA in the last 168 hours. CBC:  Recent Labs Lab 06/15/14 1535  WBC 9.3  NEUTROABS 6.7  HGB 15.0  HCT 43.5  MCV 86.0  PLT 147*   Cardiac Enzymes:  Recent Labs Lab 06/15/14 1535  TROPONINI <0.03   BNP: BNP (last 3 results) No results for input(s): BNP in the last 8760  hours.  ProBNP (last 3 results) No results for input(s): PROBNP in the last 8760 hours.  CBG:  Recent Labs Lab 06/15/14 2103 06/16/14 0734 06/16/14 1207  GLUCAP 155* 128* 130*    Time coordinating discharge: Over 30 minutes

## 2014-06-16 NOTE — Discharge Instructions (Signed)
Transient Ischemic Attack  A transient ischemic attack (TIA) is a "warning stroke" that causes stroke-like symptoms. Unlike a stroke, a TIA does not cause permanent damage to the brain. The symptoms of a TIA can happen very fast and do not last long. It is important to know the symptoms of a TIA and what to do. This can help prevent a major stroke or death.  CAUSES   · A TIA is caused by a temporary blockage in an artery in the brain or neck (carotid artery). The blockage does not allow the brain to get the blood supply it needs and can cause different symptoms. The blockage can be caused by either:  ¨ A blood clot.  ¨ Fatty buildup (plaque) in a neck or brain artery.  RISK FACTORS  · High blood pressure (hypertension).  · High cholesterol.  · Diabetes mellitus.  · Heart disease.  · The build up of plaque in the blood vessels (peripheral artery disease or atherosclerosis).  · The build up of plaque in the blood vessels providing blood and oxygen to the brain (carotid artery stenosis).  · An abnormal heart rhythm (atrial fibrillation).  · Obesity.  · Smoking.  · Taking oral contraceptives (especially in combination with smoking).  · Physical inactivity.  · A diet high in fats, salt (sodium), and calories.  · Alcohol use.  · Use of illegal drugs (especially cocaine and methamphetamine).  · Being male.  · Being African American.  · Being over the age of 55.  · Family history of stroke.  · Previous history of blood clots, stroke, TIA, or heart attack.  · Sickle cell disease.  SYMPTOMS   TIA symptoms are the same as a stroke but are temporary. These symptoms usually develop suddenly, or may be newly present upon awakening from sleep:  · Sudden weakness or numbness of the face, arm, or leg, especially on one side of the body.  · Sudden trouble walking or difficulty moving arms or legs.  · Sudden confusion.  · Sudden personality changes.  · Trouble speaking (aphasia) or understanding.  · Difficulty swallowing.  · Sudden  trouble seeing in one or both eyes.  · Double vision.  · Dizziness.  · Loss of balance or coordination.  · Sudden severe headache with no known cause.  · Trouble reading or writing.  · Loss of bowel or bladder control.  · Loss of consciousness.  DIAGNOSIS   Your caregiver may be able to determine the presence or absence of a TIA based on your symptoms, history, and physical exam. Computed tomography (CT scan) of the brain is usually performed to help identify a TIA. Other tests may be done to diagnose a TIA. These tests may include:  · Electrocardiography.  · Continuous heart monitoring.  · Echocardiography.  · Carotid ultrasonography.  · Magnetic resonance imaging (MRI).  · A scan of the brain circulation.  · Blood tests.  PREVENTION   The risk of a TIA can be decreased by appropriately treating high blood pressure, high cholesterol, diabetes, heart disease, and obesity and by quitting smoking, limiting alcohol, and staying physically active.  TREATMENT   Time is of the essence. Since the symptoms of TIA are the same as a stroke, it is important to seek treatment as soon as possible because you may need a medicine to dissolve the clot (thrombolytic) that cannot be given if too much time has passed. Treatment options vary. Treatment options may include rest, oxygen, intravenous (IV) fluids,   and medicines to thin the blood (anticoagulants). Medicines and diet may be used to address diabetes, high blood pressure, and other risk factors. Measures will be taken to prevent short-term and long-term complications, including infection from breathing foreign material into the lungs (aspiration pneumonia), blood clots in the legs, and falls. Treatment options include procedures to either remove plaque in the carotid arteries or dilate carotid arteries that have narrowed due to plaque. Those procedures are:  · Carotid endarterectomy.  · Carotid angioplasty and stenting.  HOME CARE INSTRUCTIONS   · Take all medicines prescribed  by your caregiver. Follow the directions carefully. Medicines may be used to control risk factors for a stroke. Be sure you understand all your medicine instructions.  · You may be told to take aspirin or the anticoagulant warfarin. Warfarin needs to be taken exactly as instructed.  ¨ Taking too much or too little warfarin is dangerous. Too much warfarin increases the risk of bleeding. Too little warfarin continues to allow the risk for blood clots. While taking warfarin, you will need to have regular blood tests to measure your blood clotting time. A PT blood test measures how long it takes for blood to clot. Your PT is used to calculate another value called an INR. Your PT and INR help your caregiver to adjust your dose of warfarin. The dose can change for many reasons. It is critically important that you take warfarin exactly as prescribed.  ¨ Many foods, especially foods high in vitamin K can interfere with warfarin and affect the PT and INR. Foods high in vitamin K include spinach, kale, broccoli, cabbage, collard and turnip greens, brussels sprouts, peas, cauliflower, seaweed, and parsley as well as beef and pork liver, green tea, and soybean oil. You should eat a consistent amount of foods high in vitamin K. Avoid major changes in your diet, or notify your caregiver before changing your diet. Arrange a visit with a dietitian to answer your questions.  ¨ Many medicines can interfere with warfarin and affect the PT and INR. You must tell your caregiver about any and all medicines you take, this includes all vitamins and supplements. Be especially cautious with aspirin and anti-inflammatory medicines. Do not take or discontinue any prescribed or over-the-counter medicine except on the advice of your caregiver or pharmacist.  ¨ Warfarin can have side effects, such as excessive bruising or bleeding. You will need to hold pressure over cuts for longer than usual. Your caregiver or pharmacist will discuss other  potential side effects.  ¨ Avoid sports or activities that may cause injury or bleeding.  ¨ Be mindful when shaving, flossing your teeth, or handling sharp objects.  ¨ Alcohol can change the body's ability to handle warfarin. It is best to avoid alcoholic drinks or consume only very small amounts while taking warfarin. Notify your caregiver if you change your alcohol intake.  ¨ Notify your dentist or other caregivers before procedures.  · Eat a diet that includes 5 or more servings of fruits and vegetables each day. This may reduce the risk of stroke. Certain diets may be prescribed to address high blood pressure, high cholesterol, diabetes, or obesity.  ¨ A low-sodium, low-saturated fat, low-trans fat, low-cholesterol diet is recommended to manage high blood pressure.  ¨ A low-saturated fat, low-trans fat, low-cholesterol, and high-fiber diet may control cholesterol levels.  ¨ A controlled-carbohydrate, controlled-sugar diet is recommended to manage diabetes.  ¨ A reduced-calorie, low-sodium, low-saturated fat, low-trans fat, low-cholesterol diet is recommended to manage obesity.  ·   Maintain a healthy weight.  · Stay physically active. It is recommended that you get at least 30 minutes of activity on most or all days.  · Do not smoke.  · Limit alcohol use even if you are not taking warfarin. Moderate alcohol use is considered to be:  ¨ No more than 2 drinks each day for men.  ¨ No more than 1 drink each day for nonpregnant women.  · Stop drug abuse.  · Home safety. A safe home environment is important to reduce the risk of falls. Your caregiver may arrange for specialists to evaluate your home. Having grab bars in the bedroom and bathroom is often important. Your caregiver may arrange for equipment to be used at home, such as raised toilets and a seat for the shower.  · Follow all instructions for follow-up with your caregiver. This is very important. This includes any referrals and lab tests. Proper follow up can  prevent a stroke or another TIA from occurring.  SEEK MEDICAL CARE IF:  · You have personality changes.  · You have difficulty swallowing.  · You are seeing double.  · You have dizziness.  · You have a fever.  · You have skin breakdown.  SEEK IMMEDIATE MEDICAL CARE IF:   Any of these symptoms may represent a serious problem that is an emergency. Do not wait to see if the symptoms will go away. Get medical help right away. Call your local emergency services (911 in [place] Do not drive yourself to the hospital.  · You have sudden weakness or numbness of the face, arm, or leg, especially on one side of the body.  · You have sudden trouble walking or difficulty moving arms or legs.  · You have sudden confusion.  · You have trouble speaking (aphasia) or understanding.  · You have sudden trouble seeing in one or both eyes.  · You have a loss of balance or coordination.  · You have a sudden, severe headache with no known cause.  · You have new chest pain or an irregular heartbeat.  · You have a partial or total loss of consciousness.  MAKE SURE YOU:   · Understand these instructions.  · Will watch your condition.  · Will get help right away if you are not doing well or get worse.  Document Released: 11/28/2004 Document Revised: 02/23/2013 Document Reviewed: 05/26/2013  [employer]® Patient Information ©2015 [employer]. This information is not intended to replace advice given to you by your health care provider. Make sure you discuss any questions you have with your health care provider.

## 2014-06-16 NOTE — Progress Notes (Signed)
Urine drug screen for this encounter is negative for percocet or its metabolites.  Reported last taken 06/01/14 and test done 06/03/14 so this could potentially be consistent

## 2014-06-16 NOTE — Progress Notes (Signed)
ANTICOAGULATION CONSULT NOTE - Follow up [place] for warfarin Indication: atrial fibrillation  Allergies  Allergen Reactions  . Other Other (See Comments)    Plastic tape tears skin  . Bactrim Ds [Sulfamethoxazole-Trimethoprim] Other (See Comments)    Hand tremors    Patient Measurements: Height: 5\' 8"  (172.7 cm) Weight: 171 lb 1.2 oz (77.6 kg) IBW/kg (Calculated) : 68.4  Vital Signs: Temp: 97.6 F (36.4 C) (04/14 0700) Temp Source: Oral (04/14 0700) BP: 135/68 mmHg (04/14 0700) Pulse Rate: 59 (04/14 0700)  Labs:  Recent Labs  06/15/14 1535 06/16/14 0628  HGB 15.0  --   HCT 43.5  --   PLT 147*  --   LABPROT 26.2* 24.7*  INR 2.39* 2.21*  CREATININE 2.52* 1.91*  TROPONINI <0.03  --     Estimated Creatinine Clearance: 30.8 mL/min (by C-G formula based on Cr of 1.91).   Medical History: Past Medical History  Diagnosis Date  . Gout   . Benign prostatic hypertrophy     takes Proscar daily  . Atrial fibrillation     takes Warfarin daily  . Chronic systolic dysfunction of left ventricle     a. mixed ischemic and nonischemic CM,  EF 35%. b. s/p AICD implantation.  . ED (erectile dysfunction)   . Arthritis   . Pacemaker     medtronic  . CAD (coronary artery disease)     a. s/p mid LAD stenting with DES 2008 Dr. Daneen Schick  . ICD (implantable cardiac defibrillator) in place     medtronic, Dr. Rayann Heman  . ICD (implantable cardiac defibrillator) in place   . Sleep apnea     hx of "had surgery for"  . Automatic implantable cardioverter-defibrillator in situ   . Depression   . Numbness and tingling     Hx; 20f left foot  . PAD (peripheral artery disease)     Severe by PV angiogram 09/2011  . Renal artery stenosis     s/p stenting 2009  . PAD (peripheral artery disease)     s/p multiple LLE bypass grafts; left mid-distal SCA occlusion by 08/2012 duplex  . Hypertension     takes Carvedilol and Losartan daily  . Constipation     takes Miralax  daily as needed and Colace daily   . Type II diabetes mellitus     takes Novolog 70/30  . CHF (congestive heart failure)     takes Lasix daily  . History of MRSA infection   . Insomnia     TAKES TRAZODONE NIGHTLY    Medications:  Scheduled:  . amLODipine  5 mg Oral Daily  . carvedilol  25 mg Oral BID WC  . finasteride  5 mg Oral Daily  . gabapentin  300 mg Oral TID  . insulin aspart protamine- aspart  7 Units Subcutaneous BID WC  . losartan  100 mg Oral Daily  . Warfarin - Pharmacist Dosing Inpatient   Does not apply q1800   Infusions:     Assessment: 80 yo male presents to ER with confusion and blurry vision seen by neurology at [facility]. TIA ruled ou by CT. PMH includes PVD, Afib, HTN, DM, CHF, depression, gout and CAD. To continue warfarin per pharmacy dosing for hx Afib. Patient reportedly takes warfarin 2.5mg  qpm PTA. Last dose 4/13. INR therapeutic on admission.  Today, 06/16/2014:  INR 2.21, remains therapeutic  CBC: WNL (4/13)   Goal of Therapy:  INR 2-3   Plan:  1) Warfarin 2.5mg   PO daily at 1800 per home regimen. 2) Daily INR   Gretta Arab PharmD, BCPS Pager [phone] 06/16/2014 1:13 PM

## 2014-06-16 NOTE — Telephone Encounter (Signed)
I see he went to the ER yesterday

## 2014-06-16 NOTE — Progress Notes (Signed)
OT Cancellation Note  Patient Details Name: [name] MRN: [id number] DOB: June 26, 1934   Cancelled Treatment:    Reason Eval/Treat Not Completed: Other (comment).  Pt feels he is back to baseline.  States that he still has some L hand numbness but it isn't affecting him.  Cautioned to watch it to make sure that he doesn't injure it.  Vision is no longer blurry.  Screen only  Matrice Herro 06/16/2014, 2:08 PM  Lesle Chris, OTR/L [phone] 06/16/2014

## 2014-06-16 NOTE — Progress Notes (Signed)
  Echocardiogram 2D Echocardiogram has been performed.  [name] 06/16/2014, 10:58 AM

## 2014-06-17 ENCOUNTER — Ambulatory Visit: Payer: Commercial Managed Care - HMO | Admitting: Physical Therapy

## 2014-06-17 ENCOUNTER — Telehealth: Payer: Self-pay | Admitting: *Deleted

## 2014-06-17 ENCOUNTER — Telehealth: Payer: Self-pay | Admitting: Family Medicine

## 2014-06-17 ENCOUNTER — Encounter: Payer: Self-pay | Admitting: Physical Therapy

## 2014-06-17 DIAGNOSIS — Z7409 Other reduced mobility: Secondary | ICD-10-CM

## 2014-06-17 LAB — HEMOGLOBIN A1C
Hgb A1c MFr Bld: 7.9 % — ABNORMAL HIGH (ref 4.8–5.6)
Mean Plasma Glucose: 180 mg/dL

## 2014-06-17 NOTE — Telephone Encounter (Signed)
[facility] [facility] - Client [place] Call Center Patient Name: [name] Gender: Unknown DOB: September 30, 1934 Age: 80 Y 3 M 1 D Return Phone Number: [phone] (Primary) Address: [address] City/State/Zip: [place] [address] Client [facility] Day - Client Client Site [place] - Day Physician [place], [place] Type Call Call Type Triage / Clinical Caller Name [facility] [phone] Relationship To Patient Neighbor Appointment Disposition EMR Patient Refused Appointment Info pasted into Epic Yes Return Phone Number [phone] (Primary) Chief Complaint SPEAK - sudden inability to talk or slurred speech Initial Comment Caller states neighbor had a TIA yesterday, having sx again. Slurred speech, off balance. [place] Not Listed called back line to make aware PreDisposition Home Care Nurse Assessment Nurse: Buck Mam, RN, Trish Date/Time Eilene Ghazi Time): 06/17/2014 11:28:03 AM Confirm and document reason for call. If symptomatic, describe symptoms. ---Was seen 2 days ago in ER and was released Caller states neighbor had a TIA yesterday, having sx again. Slurred speech, off balance. Has the patient traveled out of the country within the last 30 days? ---No Does the patient require triage? ---Yes Related visit to physician within the last 2 weeks? ---Yes Does the PT have any chronic conditions? (i.e. diabetes, asthma, etc.) ---Yes List chronic conditions. ---diabetic pacemaker amputee Guidelines Guideline Title Affirmed Question Affirmed Notes Nurse Date/Time (Eastern Time) Neurologic Deficit [1] Loss of speech or garbled speech AND [2] sudden onset AND [3] present now [place], [place], [place] 06/17/2014 11:31:00 AM Disp. Time Eilene Ghazi Time) Disposition Final User 06/17/2014 11:25:14 AM Send to Urgent Queue Caryl Comes 06/17/2014 11:32:34 AM Call EMS 911 Now Yes Buck Mam,  RN, Trish PLEASE NOTE: All timestamps contained within this report are represented as [place] Standard Time. CONFIDENTIALTY NOTICE: This fax transmission is intended only for the addressee. It contains information that is legally privileged, confidential or otherwise protected from use or disclosure. If you are not the intended recipient, you are strictly prohibited from reviewing, disclosing, copying using or disseminating any of this information or taking any action in reliance on or regarding this information. If you have received this fax in error, please notify us immediately by telephone so that we can arrange for its return to [place]. Phone: [phone], Toll-Free: [phone], Fax: [phone] Page: 2 of 2 Call Id: [id number] Caller Understands: Yes Disagree/Comply: Disagree Disagree/Comply Reason: Disagree with instructions Care Advice Given Per Guideline CALL EMS 911 NOW: Immediate medical attention is needed. You need to hang up and call 911 (or an ambulance). Psychologist, forensic Discretion: I'll call you back in a few minutes to be sure you were able to reach them.) CARE ADVICE given per Neurologic Deficit (Adult) guideline. After Care Instructions Given Call Event Type User Date / Time Description Comments User: Rubie Maid, RN Date/Time Eilene Ghazi Time): 06/17/2014 11:34:52 AM Patient states was just in hospital and does not want to go back.

## 2014-06-17 NOTE — Therapy (Signed)
[facility] [address] [place], [place], [address] Phone: [phone]   Fax:  [phone]  Physical Therapy Treatment  Patient Details  Name: [name] MRN: [id number] Date of Birth: [birth year] Referring Provider:  Laurey Morale, MD  Encounter Date: 06/17/2014    Past Medical History  Diagnosis Date  . Gout   . Benign prostatic hypertrophy     takes Proscar daily  . Atrial fibrillation     takes Warfarin daily  . Chronic systolic dysfunction of left ventricle     a. mixed ischemic and nonischemic CM,  EF 35%. b. s/p AICD implantation.  . ED (erectile dysfunction)   . Arthritis   . Pacemaker     medtronic  . CAD (coronary artery disease)     a. s/p mid LAD stenting with DES 2008 Dr. Daneen Schick  . ICD (implantable cardiac defibrillator) in place     medtronic, Dr. Rayann Heman  . ICD (implantable cardiac defibrillator) in place   . Sleep apnea     hx of "had surgery for"  . Automatic implantable cardioverter-defibrillator in situ   . Depression   . Numbness and tingling     Hx; 4f left foot  . PAD (peripheral artery disease)     Severe by PV angiogram 09/2011  . Renal artery stenosis     s/p stenting 2009  . PAD (peripheral artery disease)     s/p multiple LLE bypass grafts; left mid-distal SCA occlusion by 08/2012 duplex  . Hypertension     takes Carvedilol and Losartan daily  . Constipation     takes Miralax daily as needed and Colace daily   . Type II diabetes mellitus     takes Novolog 70/30  . CHF (congestive heart failure)     takes Lasix daily  . History of MRSA infection   . Insomnia     TAKES TRAZODONE NIGHTLY    Past Surgical History  Procedure Laterality Date  . Turp vaporization    . Cardiac defibrillator placement  12/26/09    pacemaker combo  . Cervical epidural injection  2013  . Femoral-tibial bypass graft  09/25/2011    Procedure: BYPASS GRAFT FEMORAL-TIBIAL ARTERY;  Surgeon:  Mal Misty, MD;  Location: [facility] OR;  Service: Vascular;  Laterality: Left;  Left Femoral - Anterior Tibial Bypass;  saphenous vein graft from left leg  . Intraoperative arteriogram  09/25/2011    Procedure: INTRA OPERATIVE ARTERIOGRAM;  Surgeon: Mal Misty, MD;  Location: [facility];  Service: Vascular;  Laterality: Left;  . Femoral-tibial bypass graft  02/07/2012    Procedure: BYPASS GRAFT FEMORAL-TIBIAL ARTERY;  Surgeon: Rosetta Posner, MD;  Location: [facility];  Service: Vascular;  Laterality: Left;  Thrombectomy Left Femoral - Tibial Bypass Graft  . Embolectomy  02/07/2012    Procedure: EMBOLECTOMY;  Surgeon: Rosetta Posner, MD;  Location: [facility];  Service: Vascular;  Laterality: Left;  . Femoral-tibial bypass graft  04/03/2012    Procedure: BYPASS GRAFT FEMORAL-TIBIAL ARTERY;  Surgeon: Mal Misty, MD;  Location: [facility];  Service: Vascular;  Laterality: Left;  Redo  . Insert / replace / remove pacemaker  2007  . Coronary angioplasty with stent placement  ~ 2000  . Coronary angioplasty    . Uvulopalatopharyngoplasty (uppp)/tonsillectomy/septoplasty  06/30/2003    Archie Endo 06/30/2003 (07/10/2012)  . Shoulder open rotator cuff repair Right 2001    repair of lacerated right/notes 10/11/1999  (07/10/2012)  . Foot  surgery Right 03/20/2001    "have plates and screws in; didn't break it" (07/10/2012)  . Carpal tunnel release Right 2002    Archie Endo 03/20/2001 (07/10/2012)  . Biceps tendon repair Right 2001    Archie Endo 03/20/2001 (07/10/2012)  . Renal artery stent  2009  . Femoral-popliteal bypass graft Left 09/16/2012    Procedure: LEFT FEMORAL-POPLITEAL BYPASS GRAFT WITH GORTEX Propaten Graft 6x80 Thin Wall and Left lower leg Angiogram;  Surgeon: Mal Misty, MD;  Location: [facility];  Service: Vascular;  Laterality: Left;  . Colonoscopy      Hx; of  . Tonsillectomy    . Adenoidectomy      Hx; of   . Femoral-tibial bypass graft Left 09/30/2012    Procedure: REDO LEFT FEMORAL-ANTERIOR TIBIAL ARTERY BYPASS USING COMPOSITE  CEPHALIC AND BASILIC VEIN GRAFT FROM LEFT ARM;  Surgeon: Mal Misty, MD;  Location: [facility];  Service: Vascular;  Laterality: Left;  . I&d extremity Left 10/21/2012    Procedure: EXPLORATION AND DEBRIDEMENT OF LEFT GROIN WOUND;  Surgeon: Mal Misty, MD;  Location: [facility];  Service: Vascular;  Laterality: Left;  . Patch angioplasty Left 10/21/2012    Procedure: PATCH ANGIOPLASTY;  Surgeon: Mal Misty, MD;  Location: [facility];  Service: Vascular;  Laterality: Left;  . Groin debridement Left 11/12/2012    Procedure: CLOSURE INGUINAL WOUND;  Surgeon: Mal Misty, MD;  Location: [facility];  Service: Vascular;  Laterality: Left;  . Embolectomy Left 12/16/2012    Procedure: THROMBECTOMY  LEFT LEG BYPASS;  Surgeon: Elam Dutch, MD;  Location: [facility];  Service: Vascular;  Laterality: Left;  . Leg amputation above knee Left 02/04/2013    DR LAWSON  . Amputation Left 02/04/2013    Procedure: AMPUTATION ABOVE KNEE-LEFT;  Surgeon: Mal Misty, MD;  Location: [facility];  Service: Vascular;  Laterality: Left;  . Removal of graft Left 04/16/2013    Procedure: I & D LEFT AKA WOUND, POSSIBLE REMOVAL OF INFECTED GORTEX GRAFT;  Surgeon: Mal Misty, MD;  Location: [facility];  Service: Vascular;  Laterality: Left;  . Abdominal aortagram N/A 09/11/2011    Procedure: ABDOMINAL Maxcine Ham;  Surgeon: Wellington Hampshire, MD;  Location: [facility] CATH LAB;  Service: Cardiovascular;  Laterality: N/A;  . Lower extremity angiogram Left 12/25/2011    Procedure: LOWER EXTREMITY ANGIOGRAM;  Surgeon: Serafina Mitchell, MD;  Location: [facility] CATH LAB;  Service: Cardiovascular;  Laterality: Left;  lt leg angio co2  . Abdominal angiogram  12/25/2011    Procedure: ABDOMINAL ANGIOGRAM;  Surgeon: Serafina Mitchell, MD;  Location: [facility] CATH LAB;  Service: Cardiovascular;;  . Lower extremity angiogram N/A 07/07/2012    Procedure: LOWER EXTREMITY ANGIOGRAM;  Surgeon: Conrad Lapel, MD;  Location: [facility] CATH LAB;  Service: Cardiovascular;  Laterality: N/A;  .  Lower extremity angiogram N/A 09/15/2012    Procedure: LOWER EXTREMITY ANGIOGRAM;  Surgeon: Serafina Mitchell, MD;  Location: [facility] CATH LAB;  Service: Cardiovascular;  Laterality: N/A;  . Upper extremity angiogram  09/15/2012    Procedure: UPPER EXTREMITY ANGIOGRAM;  Surgeon: Serafina Mitchell, MD;  Location: [facility] CATH LAB;  Service: Cardiovascular;;  . Biv icd genertaor change out N/A 11/09/2013    Procedure: BIV ICD GENERTAOR CHANGE OUT;  Surgeon: Coralyn Mark, MD;  Location: [facility] CATH LAB;  Service: Cardiovascular;  Laterality: N/A;    There were no vitals filed for this visit.  Visit Diagnosis:  Impaired functional mobility and activity tolerance  Subjective Assessment - 06/17/14 0948    Subjective Reports he just got out of hospital last night. Pt unable to recall why he was admitted "I was weak". Hospital notes reviewed and it showed pt was admitted with TIA. No orders to resume outpatient rehab noted. Only activity order noted was to increase activity slowly. Session for today cancelled and pt made aware he will need medical clearance to resume PT from his MD before we can continue with therapy. Pt to call his MD today and schedule a follow up apppointment as his discharge paperwork stated to do.                                                Patient Stated Goals He wants to walk with prosthesis without w/c or walker.             PT Short Term Goals - 06/15/14 2059    PT SHORT TERM GOAL #1   Title donnes prosthesis with new suspension correctly independently. (Target Date: 06/17/14)   Status Achieved   PT SHORT TERM GOAL #2   Title demonstrates understanding of initial HEP. (Target Date: 06/17/14)   Status Achieved   PT SHORT TERM GOAL #3   Title wears prosthesis >70% of awake hours >/= 5 days /wk. (Target Date: 06/17/14)   Status Achieved   PT SHORT TERM GOAL #4   Title ambulates 100' with rolling walker & prosthesis with cues only for deviations. (Target Date: 06/17/14)   Status  Achieved   PT SHORT TERM GOAL #5   Title stands 30sec without UE support with supervision. (Target Date: 06/17/14)   Status On-going           PT Long Term Goals - 05/18/14 0845    PT LONG TERM GOAL #1   Title wears prosthesis >80% of awake hours daily. (Target Date: 07/15/14)   Time 8   Period Weeks   Status New   PT LONG TERM GOAL #2   Title verbalize understanding of ongoing fitness plan. (Target Date: 07/15/14)   Time 8   Period Weeks   Status New   PT LONG TERM GOAL #3   Title ambulates 200' with rolling walker & prosthesis modified independent. (Target Date: 07/15/14)   Time 8   Period Weeks   Status New   PT LONG TERM GOAL #4   Title Berg Balance >18/56 (Target Date: 07/15/14)   Time 8   Period Weeks   Status New   PT LONG TERM GOAL #5   Title negotiate ramp, curb & stairs (1 rail) with rolling walker & prosthesis modified independent. (Target Date: 07/15/14)   Time 8   Period Weeks   Status New        Problem List Patient Active Problem List   Diagnosis Date Noted  . CVA (cerebral infarction) 06/15/2014  . Expressive aphasia 06/15/2014  . CKD (chronic kidney disease) stage 4, GFR 15-29 ml/min 06/15/2014  . DM (diabetes mellitus), type 2, uncontrolled, with renal complications 69/79/4801  . Unilateral AKA 02/09/2013  . S/P ICD (internal cardiac defibrillator) procedure 05/18/2012  . PAD (peripheral artery disease) 09/04/2011  . CAD (coronary artery disease)   . Chronic systolic congestive heart failure 07/06/2010  . Gout 09/18/2007  . Essential hypertension 01/23/2007  . Atrial fibrillation 01/23/2007  . BPH (benign prostatic hyperplasia) 01/23/2007  Willow Ora 06/17/2014, 9:52 AM  Willow Ora, PTA, [facility] [address] [place], [place] [address] [phone] 06/17/2014, 9:52 AM

## 2014-06-17 NOTE — Telephone Encounter (Signed)
[name] from [facility] called for pro tical on patient refusing ED visit with stroke like symptoms.

## 2014-06-17 NOTE — Telephone Encounter (Signed)
[facility] Day - Client [place] Call Center  Patient Name: ASHAD FAWBUSH  Gender: Unknown  DOB: [birth year]   Age: 80 Y 3 M 1 D  Return Phone Number: [phone] (Primary)  Address: [address]   City/State/Zip: [place] [address]   Client [facility] Day - Client  Client Site [facility] - Day  Physician Fry, [place] Type Call  Call Type Triage / Clinical  Caller Name [facility] [phone]  Relationship To Patient Neighbor  Appointment Disposition EMR Patient Refused Appointment  Info pasted into Epic Yes  Return Phone Number [phone] (Primary)  Chief Complaint SPEAK - sudden inability to talk or slurred speech  Initial Comment Caller states neighbor had a TIA yesterday, having sx again. Slurred speech, off balance.     PreDisposition Home Care      Nurse Assessment  Nurse: Buck Mam, RN, Trish Date/Time Eilene Ghazi Time): 06/17/2014 11:28:03 AM  Confirm and document reason for call. If symptomatic, describe symptoms. ---Was seen 2 days ago in ER and was released Caller states neighbor had a TIA yesterday, having sx again. Slurred speech, off balance.  Has the patient traveled out of the country within the last 30 days? ---No  Does the patient require triage? ---Yes  Related visit to physician within the last 2 weeks? ---Yes  Does the PT have any chronic conditions? (i.e. diabetes, asthma, etc.) ---Yes  List chronic conditions. ---diabetic pacemaker amputee     Guidelines      Guideline Title Affirmed Question Affirmed Notes Nurse Date/Time (Eastern Time)  Neurologic Deficit [1] Loss of speech or garbled speech AND [2] sudden onset AND [3] present now  [place], [place], [place] 06/17/2014 11:31:00 AM   Disp. Time Eilene Ghazi Time) Disposition Final User   06/17/2014 11:25:14 AM Send to Urgent Queue  Caryl Comes    06/17/2014 11:32:34 AM Call EMS 911 Now Yes Buck Mam,  RN, Particia Lather Understands: Yes  Disagree/Comply: Disagree  Disagree/Comply Reason: Disagree with instructions   Care Advice Given Per Guideline      CALL EMS 911 NOW: Immediate medical attention is needed. You need to hang up and call 911 (or an ambulance). Psychologist, forensic Discretion: I'll call you back in a few minutes to be sure you were able to reach them.) CARE ADVICE given per Neurologic Deficit (Adult) guideline.   After Care Instructions Given     Call Event Type User Date / Time Description        Comments  User: Rubie Maid, RN Date/Time Eilene Ghazi Time): 06/17/2014 11:34:52 AM  Patient states was just in hospital and does not want to go back.   Please call patient

## 2014-06-17 NOTE — Telephone Encounter (Signed)
Duplicate, see previous note dated today.

## 2014-06-17 NOTE — Telephone Encounter (Signed)
I spoke with pt and stated that he is not having any symptoms today. His speech did not sound slurred, he was sitting in chair at home resting. Per Dr. Sarajane Jews if pt has any symptoms, we advised him to go to the ER at [facility], pt agreed. He is on the schedule for Monday 06/20/14 to see Dr. Sarajane Jews.

## 2014-06-17 NOTE — Telephone Encounter (Signed)
I spoke with pt, see previous note dated same day. It has the documentation.

## 2014-06-20 ENCOUNTER — Ambulatory Visit (INDEPENDENT_AMBULATORY_CARE_PROVIDER_SITE_OTHER): Payer: Commercial Managed Care - HMO | Admitting: Family Medicine

## 2014-06-20 ENCOUNTER — Encounter: Payer: Self-pay | Admitting: Family Medicine

## 2014-06-20 ENCOUNTER — Ambulatory Visit: Payer: Commercial Managed Care - HMO | Admitting: Physical Therapy

## 2014-06-20 ENCOUNTER — Ambulatory Visit (INDEPENDENT_AMBULATORY_CARE_PROVIDER_SITE_OTHER): Payer: Commercial Managed Care - HMO | Admitting: General Practice

## 2014-06-20 VITALS — BP 100/75 | HR 64 | Temp 98.5°F

## 2014-06-20 DIAGNOSIS — R4701 Aphasia: Secondary | ICD-10-CM | POA: Diagnosis not present

## 2014-06-20 DIAGNOSIS — I639 Cerebral infarction, unspecified: Secondary | ICD-10-CM | POA: Diagnosis not present

## 2014-06-20 DIAGNOSIS — N184 Chronic kidney disease, stage 4 (severe): Secondary | ICD-10-CM | POA: Diagnosis not present

## 2014-06-20 DIAGNOSIS — M1000 Idiopathic gout, unspecified site: Secondary | ICD-10-CM | POA: Diagnosis not present

## 2014-06-20 DIAGNOSIS — I4891 Unspecified atrial fibrillation: Secondary | ICD-10-CM | POA: Diagnosis not present

## 2014-06-20 DIAGNOSIS — Z5181 Encounter for therapeutic drug level monitoring: Secondary | ICD-10-CM

## 2014-06-20 DIAGNOSIS — E1129 Type 2 diabetes mellitus with other diabetic kidney complication: Secondary | ICD-10-CM

## 2014-06-20 DIAGNOSIS — E1165 Type 2 diabetes mellitus with hyperglycemia: Secondary | ICD-10-CM

## 2014-06-20 DIAGNOSIS — IMO0002 Reserved for concepts with insufficient information to code with codable children: Secondary | ICD-10-CM

## 2014-06-20 LAB — POCT INR: INR: 2

## 2014-06-20 MED ORDER — METHYLPREDNISOLONE ACETATE 80 MG/ML IJ SUSP
120.0000 mg | Freq: Once | INTRAMUSCULAR | Status: AC
  Administered 2014-06-20: 120 mg via INTRAMUSCULAR

## 2014-06-20 NOTE — Addendum Note (Signed)
Addended by: Aggie Hacker A on: 06/20/2014 02:35 PM   Modules accepted: Orders

## 2014-06-20 NOTE — Progress Notes (Signed)
Pre visit review using our clinic review tool, if applicable. No additional management support is needed unless otherwise documented below in the visit note. Declined to weigh

## 2014-06-20 NOTE — Progress Notes (Signed)
   Subjective:    Patient ID: [name], male    DOB: 07/15/1934, 80 y.o.   MRN: [id number]  HPI Here to follow up a hospital stay from 06-15-14 to 06-16-14 for a TIA. He presented with expressive aphasia (both verbal and written) and these sx resolved within a few hours. A CT scan showed only an old infarct. He was sent back home and he has done well since then. No further trouble expressing himself. His creatinine in the hospital bumped up to 2.52 during the stay but this was down to 1.91 by his DC home. Also he has had pain and swelling in the right foot the past 2 days.   Review of Systems  Constitutional: Negative.   Respiratory: Negative.   Cardiovascular: Negative.   Musculoskeletal: Positive for joint swelling and arthralgias.  Neurological: Negative.        Objective:   Physical Exam  Constitutional:  Alert, speech is normal   Cardiovascular: Normal rate, regular rhythm, normal heart sounds and intact distal pulses.   Pulmonary/Chest: Breath sounds normal.  Musculoskeletal:  The 2nd and third toes on the right foot are red, swollen, warm, and very tender           Assessment & Plan:  Given a steroid shot for the gout. His TIA seems to have resolved. He is cleared to start back on outpatient rehab per Jamey Reas PT on 06-27-14.

## 2014-06-20 NOTE — Progress Notes (Signed)
Pre visit review using our clinic review tool, if applicable. No additional management support is needed unless otherwise documented below in the visit note. 

## 2014-06-22 ENCOUNTER — Ambulatory Visit: Payer: Commercial Managed Care - HMO | Admitting: Physical Therapy

## 2014-06-23 ENCOUNTER — Encounter: Payer: Self-pay | Admitting: Cardiology

## 2014-06-27 ENCOUNTER — Ambulatory Visit: Payer: Commercial Managed Care - HMO | Admitting: Physical Therapy

## 2014-06-27 ENCOUNTER — Encounter: Payer: Self-pay | Admitting: Physical Therapy

## 2014-06-27 DIAGNOSIS — R29818 Other symptoms and signs involving the nervous system: Secondary | ICD-10-CM | POA: Diagnosis not present

## 2014-06-27 DIAGNOSIS — Z89612 Acquired absence of left leg above knee: Secondary | ICD-10-CM | POA: Diagnosis not present

## 2014-06-27 DIAGNOSIS — Z7409 Other reduced mobility: Secondary | ICD-10-CM | POA: Diagnosis not present

## 2014-06-27 DIAGNOSIS — R269 Unspecified abnormalities of gait and mobility: Secondary | ICD-10-CM

## 2014-06-27 DIAGNOSIS — M24652 Ankylosis, left hip: Secondary | ICD-10-CM | POA: Diagnosis not present

## 2014-06-27 DIAGNOSIS — R531 Weakness: Secondary | ICD-10-CM

## 2014-06-27 DIAGNOSIS — R2689 Other abnormalities of gait and mobility: Secondary | ICD-10-CM

## 2014-06-27 NOTE — Therapy (Signed)
[facility] [address] [place], [place], [address] Phone: [phone]   Fax:  [phone]  Physical Therapy Treatment  Patient Details  Name: [name] MRN: [id number] Date of Birth: 29-Apr-1934 Referring Provider:  Laurey Morale, MD  Encounter Date: 06/27/2014      PT End of Session - 06/27/14 0930    Visit Number 9   Number of Visits 17   Date for PT Re-Evaluation 07/15/04   PT Start Time 0933   PT Stop Time 1015   PT Time Calculation (min) 42 min   Equipment Utilized During Treatment Gait belt   Activity Tolerance Patient limited by fatigue   Behavior During Therapy [facility] for tasks assessed/performed      Past Medical History  Diagnosis Date  . Gout   . Benign prostatic hypertrophy     takes Proscar daily  . Atrial fibrillation     takes Warfarin daily  . Chronic systolic dysfunction of left ventricle     a. mixed ischemic and nonischemic CM,  EF 35%. b. s/p AICD implantation.  . ED (erectile dysfunction)   . Arthritis   . Pacemaker     medtronic  . CAD (coronary artery disease)     a. s/p mid LAD stenting with DES 2008 Dr. Daneen Schick  . ICD (implantable cardiac defibrillator) in place     medtronic, Dr. Rayann Heman  . ICD (implantable cardiac defibrillator) in place   . Sleep apnea     hx of "had surgery for"  . Automatic implantable cardioverter-defibrillator in situ   . Depression   . Numbness and tingling     Hx; 28f left foot  . PAD (peripheral artery disease)     Severe by PV angiogram 09/2011  . Renal artery stenosis     s/p stenting 2009  . PAD (peripheral artery disease)     s/p multiple LLE bypass grafts; left mid-distal SCA occlusion by 08/2012 duplex  . Hypertension     takes Carvedilol and Losartan daily  . Constipation     takes Miralax daily as needed and Colace daily   . Type II diabetes mellitus     takes Novolog 70/30  . CHF (congestive heart failure)     takes Lasix  daily  . History of MRSA infection   . Insomnia     TAKES TRAZODONE NIGHTLY    Past Surgical History  Procedure Laterality Date  . Turp vaporization    . Cardiac defibrillator placement  12/26/09    pacemaker combo  . Cervical epidural injection  2013  . Femoral-tibial bypass graft  09/25/2011    Procedure: BYPASS GRAFT FEMORAL-TIBIAL ARTERY;  Surgeon: Mal Misty, MD;  Location: [facility] OR;  Service: Vascular;  Laterality: Left;  Left Femoral - Anterior Tibial Bypass;  saphenous vein graft from left leg  . Intraoperative arteriogram  09/25/2011    Procedure: INTRA OPERATIVE ARTERIOGRAM;  Surgeon: Mal Misty, MD;  Location: [place];  Service: Vascular;  Laterality: Left;  . Femoral-tibial bypass graft  02/07/2012    Procedure: BYPASS GRAFT FEMORAL-TIBIAL ARTERY;  Surgeon: Rosetta Posner, MD;  Location: [place];  Service: Vascular;  Laterality: Left;  Thrombectomy Left Femoral - Tibial Bypass Graft  . Embolectomy  02/07/2012    Procedure: EMBOLECTOMY;  Surgeon: Rosetta Posner, MD;  Location: [place];  Service: Vascular;  Laterality: Left;  . Femoral-tibial bypass graft  04/03/2012    Procedure: BYPASS GRAFT FEMORAL-TIBIAL ARTERY;  Surgeon: Mal Misty, MD;  Location: [place];  Service: Vascular;  Laterality: Left;  Redo  . Insert / replace / remove pacemaker  2007  . Coronary angioplasty with stent placement  ~ 2000  . Coronary angioplasty    . Uvulopalatopharyngoplasty (uppp)/tonsillectomy/septoplasty  06/30/2003    Archie Endo 06/30/2003 (07/10/2012)  . Shoulder open rotator cuff repair Right 2001    repair of lacerated right/notes 10/11/1999  (07/10/2012)  . Foot surgery Right 03/20/2001    "have plates and screws in; didn't break it" (07/10/2012)  . Carpal tunnel release Right 2002    Archie Endo 03/20/2001 (07/10/2012)  . Biceps tendon repair Right 2001    Archie Endo 03/20/2001 (07/10/2012)  . Renal artery stent  2009  . Femoral-popliteal bypass graft Left 09/16/2012    Procedure: LEFT FEMORAL-POPLITEAL BYPASS GRAFT  WITH GORTEX Propaten Graft 6x80 Thin Wall and Left lower leg Angiogram;  Surgeon: Mal Misty, MD;  Location: [place];  Service: Vascular;  Laterality: Left;  . Colonoscopy      Hx; of  . Tonsillectomy    . Adenoidectomy      Hx; of   . Femoral-tibial bypass graft Left 09/30/2012    Procedure: REDO LEFT FEMORAL-ANTERIOR TIBIAL ARTERY BYPASS USING COMPOSITE CEPHALIC AND BASILIC VEIN GRAFT FROM LEFT ARM;  Surgeon: Mal Misty, MD;  Location: [place];  Service: Vascular;  Laterality: Left;  . I&d extremity Left 10/21/2012    Procedure: EXPLORATION AND DEBRIDEMENT OF LEFT GROIN WOUND;  Surgeon: Mal Misty, MD;  Location: [place];  Service: Vascular;  Laterality: Left;  . Patch angioplasty Left 10/21/2012    Procedure: PATCH ANGIOPLASTY;  Surgeon: Mal Misty, MD;  Location: [place];  Service: Vascular;  Laterality: Left;  . Groin debridement Left 11/12/2012    Procedure: CLOSURE INGUINAL WOUND;  Surgeon: Mal Misty, MD;  Location: [place];  Service: Vascular;  Laterality: Left;  . Embolectomy Left 12/16/2012    Procedure: THROMBECTOMY  LEFT LEG BYPASS;  Surgeon: Elam Dutch, MD;  Location: [place];  Service: Vascular;  Laterality: Left;  . Leg amputation above knee Left 02/04/2013    DR LAWSON  . Amputation Left 02/04/2013    Procedure: AMPUTATION ABOVE KNEE-LEFT;  Surgeon: Mal Misty, MD;  Location: [place];  Service: Vascular;  Laterality: Left;  . Removal of graft Left 04/16/2013    Procedure: I & D LEFT AKA WOUND, POSSIBLE REMOVAL OF INFECTED GORTEX GRAFT;  Surgeon: Mal Misty, MD;  Location: [place];  Service: Vascular;  Laterality: Left;  . Abdominal aortagram N/A 09/11/2011    Procedure: ABDOMINAL Maxcine Ham;  Surgeon: Wellington Hampshire, MD;  Location: [facility] CATH LAB;  Service: Cardiovascular;  Laterality: N/A;  . Lower extremity angiogram Left 12/25/2011    Procedure: LOWER EXTREMITY ANGIOGRAM;  Surgeon: Serafina Mitchell, MD;  Location: [facility] CATH LAB;  Service: Cardiovascular;   Laterality: Left;  lt leg angio co2  . Abdominal angiogram  12/25/2011    Procedure: ABDOMINAL ANGIOGRAM;  Surgeon: Serafina Mitchell, MD;  Location: [facility] CATH LAB;  Service: Cardiovascular;;  . Lower extremity angiogram N/A 07/07/2012    Procedure: LOWER EXTREMITY ANGIOGRAM;  Surgeon: Conrad La Crosse, MD;  Location: [facility] CATH LAB;  Service: Cardiovascular;  Laterality: N/A;  . Lower extremity angiogram N/A 09/15/2012    Procedure: LOWER EXTREMITY ANGIOGRAM;  Surgeon: Serafina Mitchell, MD;  Location: [facility] CATH LAB;  Service: Cardiovascular;  Laterality: N/A;  . Upper extremity angiogram  09/15/2012  Procedure: UPPER EXTREMITY ANGIOGRAM;  Surgeon: Serafina Mitchell, MD;  Location: [facility] CATH LAB;  Service: Cardiovascular;;  . Biv icd genertaor change out N/A 11/09/2013    Procedure: BIV ICD GENERTAOR CHANGE OUT;  Surgeon: Coralyn Mark, MD;  Location: [facility] CATH LAB;  Service: Cardiovascular;  Laterality: N/A;    There were no vitals filed for this visit.  Visit Diagnosis:  Impaired functional mobility and activity tolerance  Abnormality of gait  Weakness generalized  Status post above knee amputation of left lower extremity  Balance problems      Subjective Assessment - 06/27/14 0946    Subjective Reports no issues since TIA. Patient reports wearing prosthesis all prosthesis all day. Went to [facility] last Friday to resume using recumbent stepper with both arms & legs for 3 sets 51min work 4 min rest without issues.   Currently in Pain? No/denies     Prosthetic Training: Sit to/from stand from chairs with armrests to RW with cues.  Patient ambulated with Supervision Device: Rolling walker & prosthesis  Distance: 110', 100', 120' Multi-modal cues for deviations: prosthesis is plantarflexed to facilitate prosthetic knee ext, so PT added 1/4" heel wedge to sound side to accommodate theoretical leg length discrepancy, posture, step length  Patient negotiated ramp with Supervision Device: Rolling walker &  prosthesis  Patient negotiated curb with Supervision Device: Rolling walker & prosthesis  Patient negotiated stairs with Supervision Device: Two handrails & prosthesis step-to pattern                            PT Education - 06/27/14 0930    Education provided Yes   Education Details increase recumbent stepper to 7 min work 3 min rests 3 sets, increasing activity level with using RW to access home & limited community, risk factors & signs /symptoms of CVA - call "[phone]"   Person(s) Educated Patient   Methods Explanation   Comprehension Verbalized understanding;Need further instruction          PT Short Term Goals - 06/15/14 2059    PT SHORT TERM GOAL #1   Title donnes prosthesis with new suspension correctly independently. (Target Date: 06/17/14)   Status Achieved   PT SHORT TERM GOAL #2   Title demonstrates understanding of initial HEP. (Target Date: 06/17/14)   Status Achieved   PT SHORT TERM GOAL #3   Title wears prosthesis >70% of awake hours >/= 5 days /wk. (Target Date: 06/17/14)   Status Achieved   PT SHORT TERM GOAL #4   Title ambulates 100' with rolling walker & prosthesis with cues only for deviations. (Target Date: 06/17/14)   Status Achieved   PT SHORT TERM GOAL #5   Title stands 30sec without UE support with supervision. (Target Date: 06/17/14)   Status On-going           PT Long Term Goals - 05/18/14 0845    PT LONG TERM GOAL #1   Title wears prosthesis >80% of awake hours daily. (Target Date: 07/15/14)   Time 8   Period Weeks   Status New   PT LONG TERM GOAL #2   Title verbalize understanding of ongoing fitness plan. (Target Date: 07/15/14)   Time 8   Period Weeks   Status New   PT LONG TERM GOAL #3   Title ambulates 200' with rolling walker & prosthesis modified independent. (Target Date: 07/15/14)   Time 8   Period Weeks   Status New  PT LONG TERM GOAL #4   Title Berg Balance >18/56 (Target Date: 07/15/14)   Time 8   Period Weeks    Status New   PT LONG TERM GOAL #5   Title negotiate ramp, curb & stairs (1 rail) with rolling walker & prosthesis modified independent. (Target Date: 07/15/14)   Time 8   Period Weeks   Status New               Plan - 06/27/14 0930    Clinical Impression Statement Patient tolerated first session back since TIA. He reports back pain limited activity tolerance. Patient's prosthesis has foot plantarflexed to assist knee stability which makes prosthesis seem long. PT added  1/4" wedge to sound foot shoe. No immediate relief.   Pt will benefit from skilled therapeutic intervention in order to improve on the following deficits Abnormal gait;Cardiopulmonary status limiting activity;Decreased activity tolerance;Decreased balance;Decreased endurance;Decreased knowledge of use of DME;Decreased mobility;Decreased range of motion;Decreased strength;Pain;Other (comment)  prosthetic dependency   Rehab Potential Good   PT Frequency 2x / week   PT Duration 8 weeks   PT Treatment/Interventions ADLs/Self Care Home Management;DME Instruction;Gait training;Stair training;Functional mobility training;Therapeutic activities;Therapeutic exercise;Balance training;Neuromuscular re-education;Patient/family education;Other (comment)  prosthetic training   PT Next Visit Plan  strength including core, endurance, Prosthetic training in gait, barriers with walker   Consulted and Agree with Plan of Care Patient        Problem List Patient Active Problem List   Diagnosis Date Noted  . CVA (cerebral infarction) 06/15/2014  . Expressive aphasia 06/15/2014  . CKD (chronic kidney disease) stage 4, GFR 15-29 ml/min 06/15/2014  . DM (diabetes mellitus), type 2, uncontrolled, with renal complications 76/54/6503  . Unilateral AKA 02/09/2013  . S/P ICD (internal cardiac defibrillator) procedure 05/18/2012  . PAD (peripheral artery disease) 09/04/2011  . CAD (coronary artery disease)   . Chronic systolic  congestive heart failure 07/06/2010  . Gout 09/18/2007  . Essential hypertension 01/23/2007  . Atrial fibrillation 01/23/2007  . BPH (benign prostatic hyperplasia) 01/23/2007    Jamey Reas PT, DPT 06/27/2014, 9:11 PM  [facility] [address] [place], [place], [address] Phone: [phone]   Fax:  [phone]

## 2014-06-30 ENCOUNTER — Encounter: Payer: Self-pay | Admitting: Physical Therapy

## 2014-06-30 ENCOUNTER — Ambulatory Visit: Payer: Commercial Managed Care - HMO | Admitting: Physical Therapy

## 2014-06-30 DIAGNOSIS — R531 Weakness: Secondary | ICD-10-CM

## 2014-06-30 DIAGNOSIS — R2689 Other abnormalities of gait and mobility: Secondary | ICD-10-CM

## 2014-06-30 DIAGNOSIS — Z7409 Other reduced mobility: Secondary | ICD-10-CM

## 2014-06-30 DIAGNOSIS — R269 Unspecified abnormalities of gait and mobility: Secondary | ICD-10-CM

## 2014-06-30 NOTE — Therapy (Signed)
[facility] [address] [place], [place], [address] Phone: [phone]   Fax:  [phone]  Physical Therapy Treatment  Patient Details  Name: [name] MRN: [id number] Date of Birth: November 18, 1934 Referring Provider:  Laurey Morale, MD  Encounter Date: 06/30/2014      PT End of Session - 06/30/14 1023    Visit Number 10   Number of Visits 17   Date for PT Re-Evaluation 07/15/04   PT Start Time 1016   PT Stop Time 1055   PT Time Calculation (min) 39 min   Equipment Utilized During Treatment Gait belt   Activity Tolerance Patient limited by fatigue   Behavior During Therapy [facility] for tasks assessed/performed      Past Medical History  Diagnosis Date  . Gout   . Benign prostatic hypertrophy     takes Proscar daily  . Atrial fibrillation     takes Warfarin daily  . Chronic systolic dysfunction of left ventricle     a. mixed ischemic and nonischemic CM,  EF 35%. b. s/p AICD implantation.  . ED (erectile dysfunction)   . Arthritis   . Pacemaker     medtronic  . CAD (coronary artery disease)     a. s/p mid LAD stenting with DES 2008 Dr. Daneen Schick  . ICD (implantable cardiac defibrillator) in place     medtronic, Dr. Rayann Heman  . ICD (implantable cardiac defibrillator) in place   . Sleep apnea     hx of "had surgery for"  . Automatic implantable cardioverter-defibrillator in situ   . Depression   . Numbness and tingling     Hx; 30f left foot  . PAD (peripheral artery disease)     Severe by PV angiogram 09/2011  . Renal artery stenosis     s/p stenting 2009  . PAD (peripheral artery disease)     s/p multiple LLE bypass grafts; left mid-distal SCA occlusion by 08/2012 duplex  . Hypertension     takes Carvedilol and Losartan daily  . Constipation     takes Miralax daily as needed and Colace daily   . Type II diabetes mellitus     takes Novolog 70/30  . CHF (congestive heart failure)     takes Lasix  daily  . History of MRSA infection   . Insomnia     TAKES TRAZODONE NIGHTLY    Past Surgical History  Procedure Laterality Date  . Turp vaporization    . Cardiac defibrillator placement  12/26/09    pacemaker combo  . Cervical epidural injection  2013  . Femoral-tibial bypass graft  09/25/2011    Procedure: BYPASS GRAFT FEMORAL-TIBIAL ARTERY;  Surgeon: Mal Misty, MD;  Location: [facility] OR;  Service: Vascular;  Laterality: Left;  Left Femoral - Anterior Tibial Bypass;  saphenous vein graft from left leg  . Intraoperative arteriogram  09/25/2011    Procedure: INTRA OPERATIVE ARTERIOGRAM;  Surgeon: Mal Misty, MD;  Location: [place];  Service: Vascular;  Laterality: Left;  . Femoral-tibial bypass graft  02/07/2012    Procedure: BYPASS GRAFT FEMORAL-TIBIAL ARTERY;  Surgeon: Rosetta Posner, MD;  Location: [place];  Service: Vascular;  Laterality: Left;  Thrombectomy Left Femoral - Tibial Bypass Graft  . Embolectomy  02/07/2012    Procedure: EMBOLECTOMY;  Surgeon: Rosetta Posner, MD;  Location: [place];  Service: Vascular;  Laterality: Left;  . Femoral-tibial bypass graft  04/03/2012    Procedure: BYPASS GRAFT FEMORAL-TIBIAL ARTERY;  Surgeon: Mal Misty, MD;  Location: [place];  Service: Vascular;  Laterality: Left;  Redo  . Insert / replace / remove pacemaker  2007  . Coronary angioplasty with stent placement  ~ 2000  . Coronary angioplasty    . Uvulopalatopharyngoplasty (uppp)/tonsillectomy/septoplasty  06/30/2003    Archie Endo 06/30/2003 (07/10/2012)  . Shoulder open rotator cuff repair Right 2001    repair of lacerated right/notes 10/11/1999  (07/10/2012)  . Foot surgery Right 03/20/2001    "have plates and screws in; didn't break it" (07/10/2012)  . Carpal tunnel release Right 2002    Archie Endo 03/20/2001 (07/10/2012)  . Biceps tendon repair Right 2001    Archie Endo 03/20/2001 (07/10/2012)  . Renal artery stent  2009  . Femoral-popliteal bypass graft Left 09/16/2012    Procedure: LEFT FEMORAL-POPLITEAL BYPASS GRAFT  WITH GORTEX Propaten Graft 6x80 Thin Wall and Left lower leg Angiogram;  Surgeon: Mal Misty, MD;  Location: [place];  Service: Vascular;  Laterality: Left;  . Colonoscopy      Hx; of  . Tonsillectomy    . Adenoidectomy      Hx; of   . Femoral-tibial bypass graft Left 09/30/2012    Procedure: REDO LEFT FEMORAL-ANTERIOR TIBIAL ARTERY BYPASS USING COMPOSITE CEPHALIC AND BASILIC VEIN GRAFT FROM LEFT ARM;  Surgeon: Mal Misty, MD;  Location: [place];  Service: Vascular;  Laterality: Left;  . I&d extremity Left 10/21/2012    Procedure: EXPLORATION AND DEBRIDEMENT OF LEFT GROIN WOUND;  Surgeon: Mal Misty, MD;  Location: [place];  Service: Vascular;  Laterality: Left;  . Patch angioplasty Left 10/21/2012    Procedure: PATCH ANGIOPLASTY;  Surgeon: Mal Misty, MD;  Location: [place];  Service: Vascular;  Laterality: Left;  . Groin debridement Left 11/12/2012    Procedure: CLOSURE INGUINAL WOUND;  Surgeon: Mal Misty, MD;  Location: [place];  Service: Vascular;  Laterality: Left;  . Embolectomy Left 12/16/2012    Procedure: THROMBECTOMY  LEFT LEG BYPASS;  Surgeon: Elam Dutch, MD;  Location: [place];  Service: Vascular;  Laterality: Left;  . Leg amputation above knee Left 02/04/2013    DR LAWSON  . Amputation Left 02/04/2013    Procedure: AMPUTATION ABOVE KNEE-LEFT;  Surgeon: Mal Misty, MD;  Location: [place];  Service: Vascular;  Laterality: Left;  . Removal of graft Left 04/16/2013    Procedure: I & D LEFT AKA WOUND, POSSIBLE REMOVAL OF INFECTED GORTEX GRAFT;  Surgeon: Mal Misty, MD;  Location: [place];  Service: Vascular;  Laterality: Left;  . Abdominal aortagram N/A 09/11/2011    Procedure: ABDOMINAL Maxcine Ham;  Surgeon: Wellington Hampshire, MD;  Location: [facility] CATH LAB;  Service: Cardiovascular;  Laterality: N/A;  . Lower extremity angiogram Left 12/25/2011    Procedure: LOWER EXTREMITY ANGIOGRAM;  Surgeon: Serafina Mitchell, MD;  Location: [facility] CATH LAB;  Service: Cardiovascular;   Laterality: Left;  lt leg angio co2  . Abdominal angiogram  12/25/2011    Procedure: ABDOMINAL ANGIOGRAM;  Surgeon: Serafina Mitchell, MD;  Location: [facility] CATH LAB;  Service: Cardiovascular;;  . Lower extremity angiogram N/A 07/07/2012    Procedure: LOWER EXTREMITY ANGIOGRAM;  Surgeon: Conrad La Crosse, MD;  Location: [facility] CATH LAB;  Service: Cardiovascular;  Laterality: N/A;  . Lower extremity angiogram N/A 09/15/2012    Procedure: LOWER EXTREMITY ANGIOGRAM;  Surgeon: Serafina Mitchell, MD;  Location: [facility] CATH LAB;  Service: Cardiovascular;  Laterality: N/A;  . Upper extremity angiogram  09/15/2012  Procedure: UPPER EXTREMITY ANGIOGRAM;  Surgeon: Serafina Mitchell, MD;  Location: [facility] CATH LAB;  Service: Cardiovascular;;  . Biv icd genertaor change out N/A 11/09/2013    Procedure: BIV ICD GENERTAOR CHANGE OUT;  Surgeon: Coralyn Mark, MD;  Location: [facility] C[facility] LAB;  Service: Cardiovascular;  Laterality: N/A;    There were no vitals filed for this visit.  Visit Diagnosis:  Abnormality of gait  Weakness generalized  Balance problems  Impaired functional mobility and activity tolerance      Subjective Assessment - 06/30/14 1020    Subjective No new complaints. No pain or falls to report. Does report trouble sleeping last night. Still have issues with prosthesis fallling   Currently in Pain? No/denies           [facility] Adult PT Treatment/Exercise - 06/30/14 1025    Transfers   Sit to Stand 5: Supervision;With upper extremity assist;With armrests;From chair/3-in-1   Sit to Stand Details (indicate cue type and reason) cues to scoot forward to assist with standing   Stand to Sit 5: Supervision;With upper extremity assist;With armrests;To chair/3-in-1   Stand to Sit Details cues to reach back at times   Ambulation/Gait   Ambulation/Gait Yes   Ambulation/Gait Assistance 5: Supervision   Ambulation/Gait Assistance Details multimodal cues for increased right step length for step throught pattern   Ambulation  Distance (Feet) 120 Feet  x 3 reps   Assistive device Rolling walker;Prosthesis   Gait Pattern Step-to pattern;Step-through pattern;Decreased stride length;Decreased step length - right;Decreased stance time - left;Decreased hip/knee flexion - left;Trunk flexed   Ambulation Surface Level;Indoor   Ramp 4: Min assist   Ramp Details (indicate cue type and reason) cues on sequence, posture and technique   Curb 4: Min assist   Curb Details (indicate cue type and reason) cues on technique, posture and sequence   Prosthetics   Prosthetic Care Comments  Reinforced education on donning with suction ring suspension.    Current prosthetic wear tolerance (days/week)  daily   Current prosthetic wear tolerance (#hours/day)  >/= 90% of awake hours   Residual limb condition  intact per pt report   Education Provided Residual limb care;Correct ply sock adjustment;Proper wear schedule/adjustment  ensure limb all the way in and all air out for suction   Person(s) Educated Patient   Education Method Explanation;Verbal cues   Education Method Verbalized understanding;Needs further instruction   Donning Prosthesis Supervision   Doffing Prosthesis Modified independent (device/increased time)           PT Short Term Goals - 06/15/14 2059    PT SHORT TERM GOAL #1   Title donnes prosthesis with new suspension correctly independently. (Target Date: 06/17/14)   Status Achieved   PT SHORT TERM GOAL #2   Title demonstrates understanding of initial HEP. (Target Date: 06/17/14)   Status Achieved   PT SHORT TERM GOAL #3   Title wears prosthesis >70% of awake hours >/= 5 days /wk. (Target Date: 06/17/14)   Status Achieved   PT SHORT TERM GOAL #4   Title ambulates 100' with rolling walker & prosthesis with cues only for deviations. (Target Date: 06/17/14)   Status Achieved   PT SHORT TERM GOAL #5   Title stands 30sec without UE support with supervision. (Target Date: 06/17/14)   Status On-going           PT  Long Term Goals - 05/18/14 0845    PT LONG TERM GOAL #1   Title wears prosthesis >80%  of awake hours daily. (Target Date: 07/15/14)   Time 8   Period Weeks   Status New   PT LONG TERM GOAL #2   Title verbalize understanding of ongoing fitness plan. (Target Date: 07/15/14)   Time 8   Period Weeks   Status New   PT LONG TERM GOAL #3   Title ambulates 200' with rolling walker & prosthesis modified independent. (Target Date: 07/15/14)   Time 8   Period Weeks   Status New   PT LONG TERM GOAL #4   Title Berg Balance >18/56 (Target Date: 07/15/14)   Time 8   Period Weeks   Status New   PT LONG TERM GOAL #5   Title negotiate ramp, curb & stairs (1 rail) with rolling walker & prosthesis modified independent. (Target Date: 07/15/14)   Time 8   Period Weeks   Status New           Plan - 06/30/14 1025    Clinical Impression Statement Pt making steady progress toward goals. Pt frustrated that "he is not walking". Edcuated pt that he is "walking", just with a walker at this time .Pt feels he is not walking if he is using a device. It was explained to pt that he needs to start with this, master it and then progress to lesser device and so on. That going straight to gait with no device puts him at risk of injury. Pt also edcuated that his progress will only happen if he is putting effort into it as well, ie going to [facility] and being active outside of therapy. Pt verbalized understanding.                                      Pt will benefit from skilled therapeutic intervention in order to improve on the following deficits Abnormal gait;Cardiopulmonary status limiting activity;Decreased activity tolerance;Decreased balance;Decreased endurance;Decreased knowledge of use of DME;Decreased mobility;Decreased range of motion;Decreased strength;Pain;Other (comment)  prosthetic dependency   Rehab Potential Good   PT Frequency 2x / week   PT Duration 8 weeks   PT Treatment/Interventions ADLs/Self Care Home  Management;DME Instruction;Gait training;Stair training;Functional mobility training;Therapeutic activities;Therapeutic exercise;Balance training;Neuromuscular re-education;Patient/family education;Other (comment)  prosthetic training   PT Next Visit Plan  strength including core, endurance, Prosthetic training in gait, barriers with walker   Consulted and Agree with Plan of Care Patient        Problem List Patient Active Problem List   Diagnosis Date Noted  . CVA (cerebral infarction) 06/15/2014  . Expressive aphasia 06/15/2014  . CKD (chronic kidney disease) stage 4, GFR 15-29 ml/min 06/15/2014  . DM (diabetes mellitus), type 2, uncontrolled, with renal complications 46/27/0350  . Unilateral AKA 02/09/2013  . S/P ICD (internal cardiac defibrillator) procedure 05/18/2012  . PAD (peripheral artery disease) 09/04/2011  . CAD (coronary artery disease)   . Chronic systolic congestive heart failure 07/06/2010  . Gout 09/18/2007  . Essential hypertension 01/23/2007  . Atrial fibrillation 01/23/2007  . BPH (benign prostatic hyperplasia) 01/23/2007    Willow Ora 06/30/2014, 3:24 PM  Willow Ora, PTA, [facility] [address] [place], [place] [address] [phone] 06/30/2014, 3:24 PM

## 2014-07-04 ENCOUNTER — Encounter: Payer: Self-pay | Admitting: *Deleted

## 2014-07-04 ENCOUNTER — Ambulatory Visit (INDEPENDENT_AMBULATORY_CARE_PROVIDER_SITE_OTHER): Payer: Commercial Managed Care - HMO | Admitting: *Deleted

## 2014-07-04 ENCOUNTER — Encounter: Payer: Self-pay | Admitting: Physical Therapy

## 2014-07-04 ENCOUNTER — Ambulatory Visit: Payer: Commercial Managed Care - HMO | Attending: Family Medicine | Admitting: Physical Therapy

## 2014-07-04 DIAGNOSIS — R29818 Other symptoms and signs involving the nervous system: Secondary | ICD-10-CM | POA: Insufficient documentation

## 2014-07-04 DIAGNOSIS — R2689 Other abnormalities of gait and mobility: Secondary | ICD-10-CM

## 2014-07-04 DIAGNOSIS — R531 Weakness: Secondary | ICD-10-CM | POA: Diagnosis not present

## 2014-07-04 DIAGNOSIS — I5022 Chronic systolic (congestive) heart failure: Secondary | ICD-10-CM

## 2014-07-04 DIAGNOSIS — R269 Unspecified abnormalities of gait and mobility: Secondary | ICD-10-CM | POA: Insufficient documentation

## 2014-07-04 DIAGNOSIS — Z95810 Presence of automatic (implantable) cardiac defibrillator: Secondary | ICD-10-CM | POA: Diagnosis not present

## 2014-07-04 DIAGNOSIS — Z89612 Acquired absence of left leg above knee: Secondary | ICD-10-CM | POA: Diagnosis not present

## 2014-07-04 DIAGNOSIS — Z7409 Other reduced mobility: Secondary | ICD-10-CM | POA: Insufficient documentation

## 2014-07-04 DIAGNOSIS — M24652 Ankylosis, left hip: Secondary | ICD-10-CM | POA: Insufficient documentation

## 2014-07-04 NOTE — Progress Notes (Signed)
EPIC Encounter for ICM Monitoring  Patient Name: [name] is a 80 y.o. male Date: 07/04/2014 Primary Care Physican: Laurey Morale, MD Primary Cardiologist: Tamala Julian Electrophysiologist: Allred Dry Weight: 184 lbs (with prosthesis)       In the past month, have you:  1. Gained more than 2 pounds in a day or more than 5 pounds in a week? no  2. Had changes in your medications (with verification of current medications)? Yes. The patient was hospitalized from 4/13-4/14 with a TIA. It was noted that his creatinine was up to 2.5 on admission. Lasix was held and his creatinine was down to 1.9 at discharge. The patient was instructed to decrease his lasix to 20 mg daily from 40 mg daily. He is unsure if he has been doing this. I have advised him that if he has not been decreasing the dose, he should cut this back now. He states he will cut his pill in 1/2.   3. Had more shortness of breath than is usual for you? no  4. Limited your activity because of shortness of breath? no  5. Not been able to sleep because of shortness of breath? no  6. Had increased swelling in your feet or ankles? no  7. Had symptoms of dehydration (dizziness, dry mouth, increased thirst, decreased urine output) no  8. Had changes in sodium restriction? no  9. Been compliant with medication? Yes   ICM trend:   Follow-up plan: ICM clinic phone appointment: 08/04/14. The patient's impedence is above baseline today. He does not think he has been taking the decreased dose of lasix as instructed at discharge due to his elevated creatinine. I have advised him to decrease his lasix from 40 mg daily to 20 mg daily as directed at discharge. He voices understanding.   Copy of note sent to patient's primary care physician, primary cardiologist, and device following physician.  Alvis Lemmings, RN, BSN 07/04/2014 3:08 PM

## 2014-07-04 NOTE — Therapy (Signed)
[facility] [address] [place], [place], [address] Phone: [phone]   Fax:  [phone]  Physical Therapy Treatment  Patient Details  Name: [name] MRN: [id number] Date of Birth: [birth year] Referring Provider:  Laurey Morale, MD  Encounter Date: 07/04/2014      PT End of Session - 07/04/14 0930    Visit Number 11   Number of Visits 17   Date for PT Re-Evaluation 07/15/04   PT Start Time 0930   PT Stop Time 1015   PT Time Calculation (min) 45 min   Equipment Utilized During Treatment Gait belt   Activity Tolerance Patient limited by fatigue   Behavior During Therapy [facility] for tasks assessed/performed      Past Medical History  Diagnosis Date  . Gout   . Benign prostatic hypertrophy     takes Proscar daily  . Atrial fibrillation     takes Warfarin daily  . Chronic systolic dysfunction of left ventricle     a. mixed ischemic and nonischemic CM,  EF 35%. b. s/p AICD implantation.  . ED (erectile dysfunction)   . Arthritis   . Pacemaker     medtronic  . CAD (coronary artery disease)     a. s/p mid LAD stenting with DES 2008 Dr. Daneen Schick  . ICD (implantable cardiac defibrillator) in place     medtronic, Dr. Rayann Heman  . ICD (implantable cardiac defibrillator) in place   . Sleep apnea     hx of "had surgery for"  . Automatic implantable cardioverter-defibrillator in situ   . Depression   . Numbness and tingling     Hx; 62f left foot  . PAD (peripheral artery disease)     Severe by PV angiogram 09/2011  . Renal artery stenosis     s/p stenting 2009  . PAD (peripheral artery disease)     s/p multiple LLE bypass grafts; left mid-distal SCA occlusion by 08/2012 duplex  . Hypertension     takes Carvedilol and Losartan daily  . Constipation     takes Miralax daily as needed and Colace daily   . Type II diabetes mellitus     takes Novolog 70/30  . CHF (congestive heart failure)     takes Lasix  daily  . History of MRSA infection   . Insomnia     TAKES TRAZODONE NIGHTLY    Past Surgical History  Procedure Laterality Date  . Turp vaporization    . Cardiac defibrillator placement  12/26/09    pacemaker combo  . Cervical epidural injection  2013  . Femoral-tibial bypass graft  09/25/2011    Procedure: BYPASS GRAFT FEMORAL-TIBIAL ARTERY;  Surgeon: Mal Misty, MD;  Location: [facility] OR;  Service: Vascular;  Laterality: Left;  Left Femoral - Anterior Tibial Bypass;  saphenous vein graft from left leg  . Intraoperative arteriogram  09/25/2011    Procedure: INTRA OPERATIVE ARTERIOGRAM;  Surgeon: Mal Misty, MD;  Location: [place];  Service: Vascular;  Laterality: Left;  . Femoral-tibial bypass graft  02/07/2012    Procedure: BYPASS GRAFT FEMORAL-TIBIAL ARTERY;  Surgeon: Rosetta Posner, MD;  Location: [place];  Service: Vascular;  Laterality: Left;  Thrombectomy Left Femoral - Tibial Bypass Graft  . Embolectomy  02/07/2012    Procedure: EMBOLECTOMY;  Surgeon: Rosetta Posner, MD;  Location: [place];  Service: Vascular;  Laterality: Left;  . Femoral-tibial bypass graft  04/03/2012    Procedure: BYPASS GRAFT FEMORAL-TIBIAL ARTERY;  Surgeon: Mal Misty, MD;  Location: [place];  Service: Vascular;  Laterality: Left;  Redo  . Insert / replace / remove pacemaker  2007  . Coronary angioplasty with stent placement  ~ 2000  . Coronary angioplasty    . Uvulopalatopharyngoplasty (uppp)/tonsillectomy/septoplasty  06/30/2003    Archie Endo 06/30/2003 (07/10/2012)  . Shoulder open rotator cuff repair Right 2001    repair of lacerated right/notes 10/11/1999  (07/10/2012)  . Foot surgery Right 03/20/2001    "have plates and screws in; didn't break it" (07/10/2012)  . Carpal tunnel release Right 2002    Archie Endo 03/20/2001 (07/10/2012)  . Biceps tendon repair Right 2001    Archie Endo 03/20/2001 (07/10/2012)  . Renal artery stent  2009  . Femoral-popliteal bypass graft Left 09/16/2012    Procedure: LEFT FEMORAL-POPLITEAL BYPASS GRAFT  WITH GORTEX Propaten Graft 6x80 Thin Wall and Left lower leg Angiogram;  Surgeon: Mal Misty, MD;  Location: [place];  Service: Vascular;  Laterality: Left;  . Colonoscopy      Hx; of  . Tonsillectomy    . Adenoidectomy      Hx; of   . Femoral-tibial bypass graft Left 09/30/2012    Procedure: REDO LEFT FEMORAL-ANTERIOR TIBIAL ARTERY BYPASS USING COMPOSITE CEPHALIC AND BASILIC VEIN GRAFT FROM LEFT ARM;  Surgeon: Mal Misty, MD;  Location: [place];  Service: Vascular;  Laterality: Left;  . I&d extremity Left 10/21/2012    Procedure: EXPLORATION AND DEBRIDEMENT OF LEFT GROIN WOUND;  Surgeon: Mal Misty, MD;  Location: [place];  Service: Vascular;  Laterality: Left;  . Patch angioplasty Left 10/21/2012    Procedure: PATCH ANGIOPLASTY;  Surgeon: Mal Misty, MD;  Location: [place];  Service: Vascular;  Laterality: Left;  . Groin debridement Left 11/12/2012    Procedure: CLOSURE INGUINAL WOUND;  Surgeon: Mal Misty, MD;  Location: [place];  Service: Vascular;  Laterality: Left;  . Embolectomy Left 12/16/2012    Procedure: THROMBECTOMY  LEFT LEG BYPASS;  Surgeon: Elam Dutch, MD;  Location: [place];  Service: Vascular;  Laterality: Left;  . Leg amputation above knee Left 02/04/2013    DR LAWSON  . Amputation Left 02/04/2013    Procedure: AMPUTATION ABOVE KNEE-LEFT;  Surgeon: Mal Misty, MD;  Location: [place];  Service: Vascular;  Laterality: Left;  . Removal of graft Left 04/16/2013    Procedure: I & D LEFT AKA WOUND, POSSIBLE REMOVAL OF INFECTED GORTEX GRAFT;  Surgeon: Mal Misty, MD;  Location: [place];  Service: Vascular;  Laterality: Left;  . Abdominal aortagram N/A 09/11/2011    Procedure: ABDOMINAL Maxcine Ham;  Surgeon: Wellington Hampshire, MD;  Location: [facility] CATH LAB;  Service: Cardiovascular;  Laterality: N/A;  . Lower extremity angiogram Left 12/25/2011    Procedure: LOWER EXTREMITY ANGIOGRAM;  Surgeon: Serafina Mitchell, MD;  Location: [facility] CATH LAB;  Service: Cardiovascular;   Laterality: Left;  lt leg angio co2  . Abdominal angiogram  12/25/2011    Procedure: ABDOMINAL ANGIOGRAM;  Surgeon: Serafina Mitchell, MD;  Location: [facility] CATH LAB;  Service: Cardiovascular;;  . Lower extremity angiogram N/A 07/07/2012    Procedure: LOWER EXTREMITY ANGIOGRAM;  Surgeon: Conrad La Crosse, MD;  Location: [facility] CATH LAB;  Service: Cardiovascular;  Laterality: N/A;  . Lower extremity angiogram N/A 09/15/2012    Procedure: LOWER EXTREMITY ANGIOGRAM;  Surgeon: Serafina Mitchell, MD;  Location: [facility] CATH LAB;  Service: Cardiovascular;  Laterality: N/A;  . Upper extremity angiogram  09/15/2012  Procedure: UPPER EXTREMITY ANGIOGRAM;  Surgeon: Serafina Mitchell, MD;  Location: [facility] CATH LAB;  Service: Cardiovascular;;  . Biv icd genertaor change out N/A 11/09/2013    Procedure: BIV ICD GENERTAOR CHANGE OUT;  Surgeon: Coralyn Mark, MD;  Location: [facility] CATH LAB;  Service: Cardiovascular;  Laterality: N/A;    There were no vitals filed for this visit.  Visit Diagnosis:  Abnormality of gait  Weakness generalized  Balance problems  Impaired functional mobility and activity tolerance  Status post above knee amputation of left lower extremity      Subjective Assessment - 07/04/14 0927    Subjective he is going to [facility] 2 days/wk and PT 2 days/wk.    Currently in Pain? Yes   Pain Score 5    Pain Type Phantom pain   Pain Onset More than a month ago      Prosthetic Training: Sit to stand with  Supervision  From w/c and rollator seat using UEs with PT demo /instruction in technique / safety.  Stand to sit with  Supervision  to rollator seat using UEs with PT demo /instruction in technique / safety. Patient ambulated with Min. Assist/ CGA Device: Rolling walker/ rollator & prosthesis  Distance: 125' X 3 on surfaces: indoor Multi-modal cues for deviations: rollator walker safety / technique, posture, turning Patient negotiated ramp with Min. Assist Device: Rolling walker/ rollator & prosthesis PT demo  /instructed in technique with verbal cues. Patient negotiated curb with Min. Assist Device: Rolling walker/ rollator & prosthesis  PT demo /instructed in technique with verbal cues.                             PT Education - 07/04/14 0930    Education provided Yes   Education Details rollator vs std RW, use of rollator   Person(s) Educated Patient   Methods Explanation;Demonstration   Comprehension Verbalized understanding;Returned demonstration;Need further instruction;Verbal cues required;Tactile cues required          PT Short Term Goals - 06/15/14 2059    PT SHORT TERM GOAL #1   Title donnes prosthesis with new suspension correctly independently. (Target Date: 06/17/14)   Status Achieved   PT SHORT TERM GOAL #2   Title demonstrates understanding of initial HEP. (Target Date: 06/17/14)   Status Achieved   PT SHORT TERM GOAL #3   Title wears prosthesis >70% of awake hours >/= 5 days /wk. (Target Date: 06/17/14)   Status Achieved   PT SHORT TERM GOAL #4   Title ambulates 100' with rolling walker & prosthesis with cues only for deviations. (Target Date: 06/17/14)   Status Achieved   PT SHORT TERM GOAL #5   Title stands 30sec without UE support with supervision. (Target Date: 06/17/14)   Status On-going           PT Long Term Goals - 05/18/14 0845    PT LONG TERM GOAL #1   Title wears prosthesis >80% of awake hours daily. (Target Date: 07/15/14)   Time 8   Period Weeks   Status New   PT LONG TERM GOAL #2   Title verbalize understanding of ongoing fitness plan. (Target Date: 07/15/14)   Time 8   Period Weeks   Status New   PT LONG TERM GOAL #3   Title ambulates 200' with rolling walker & prosthesis modified independent. (Target Date: 07/15/14)   Time 8   Period Weeks   Status New  PT LONG TERM GOAL #4   Title Berg Balance >18/56 (Target Date: 07/15/14)   Time 8   Period Weeks   Status New   PT LONG TERM GOAL #5   Title negotiate ramp, curb &  stairs (1 rail) with rolling walker & prosthesis modified independent. (Target Date: 07/15/14)   Time 8   Period Weeks   Status New               Plan - 07/04/14 0930    Clinical Impression Statement Patient would benefit from use of rollator over RW to improve function, balance and having a resting point with community mobility.   Pt will benefit from skilled therapeutic intervention in order to improve on the following deficits Abnormal gait;Cardiopulmonary status limiting activity;Decreased activity tolerance;Decreased balance;Decreased endurance;Decreased knowledge of use of DME;Decreased mobility;Decreased range of motion;Decreased strength;Pain;Other (comment)  prosthetic dependency   Rehab Potential Good   PT Frequency 2x / week   PT Duration 8 weeks   PT Treatment/Interventions ADLs/Self Care Home Management;DME Instruction;Gait training;Stair training;Functional mobility training;Therapeutic activities;Therapeutic exercise;Balance training;Neuromuscular re-education;Patient/family education;Other (comment)  prosthetic training   PT Next Visit Plan  strength including core, endurance, Prosthetic training in gait, barriers with rollator walker   Consulted and Agree with Plan of Care Patient        Problem List Patient Active Problem List   Diagnosis Date Noted  . CVA (cerebral infarction) 06/15/2014  . Expressive aphasia 06/15/2014  . CKD (chronic kidney disease) stage 4, GFR 15-29 ml/min 06/15/2014  . DM (diabetes mellitus), type 2, uncontrolled, with renal complications 75/88/3254  . Unilateral AKA 02/09/2013  . S/P ICD (internal cardiac defibrillator) procedure 05/18/2012  . PAD (peripheral artery disease) 09/04/2011  . CAD (coronary artery disease)   . Chronic systolic congestive heart failure 07/06/2010  . Gout 09/18/2007  . Essential hypertension 01/23/2007  . Atrial fibrillation 01/23/2007  . BPH (benign prostatic hyperplasia) 01/23/2007    Jamey Reas  PT, DPT 07/04/2014, 7:57 PM  [facility] [address] [place], [place], [address] Phone: [phone]   Fax:  [phone]

## 2014-07-07 ENCOUNTER — Encounter: Payer: Self-pay | Admitting: Physical Therapy

## 2014-07-07 ENCOUNTER — Ambulatory Visit: Payer: Commercial Managed Care - HMO | Admitting: Physical Therapy

## 2014-07-07 DIAGNOSIS — R2689 Other abnormalities of gait and mobility: Secondary | ICD-10-CM

## 2014-07-07 DIAGNOSIS — R269 Unspecified abnormalities of gait and mobility: Secondary | ICD-10-CM | POA: Diagnosis not present

## 2014-07-07 DIAGNOSIS — R531 Weakness: Secondary | ICD-10-CM

## 2014-07-07 DIAGNOSIS — Z7409 Other reduced mobility: Secondary | ICD-10-CM

## 2014-07-07 NOTE — Therapy (Signed)
[facility] [address] [place], [place], [address] Phone: [phone]   Fax:  [phone]  Physical Therapy Treatment  Patient Details  Name: [name] MRN: [id number] Date of Birth: February 08, 1935 Referring Provider:  Laurey Morale, MD  Encounter Date: 07/07/2014      PT End of Session - 07/07/14 1022    Visit Number 12   Number of Visits 17   Date for PT Re-Evaluation 07/15/04   PT Start Time 1016   PT Stop Time 1055   PT Time Calculation (min) 39 min   Equipment Utilized During Treatment Gait belt   Activity Tolerance Patient limited by fatigue   Behavior During Therapy [facility] for tasks assessed/performed      Past Medical History  Diagnosis Date  . Gout   . Benign prostatic hypertrophy     takes Proscar daily  . Atrial fibrillation     takes Warfarin daily  . Chronic systolic dysfunction of left ventricle     a. mixed ischemic and nonischemic CM,  EF 35%. b. s/p AICD implantation.  . ED (erectile dysfunction)   . Arthritis   . Pacemaker     medtronic  . CAD (coronary artery disease)     a. s/p mid LAD stenting with DES 2008 Dr. Daneen Schick  . ICD (implantable cardiac defibrillator) in place     medtronic, Dr. Rayann Heman  . ICD (implantable cardiac defibrillator) in place   . Sleep apnea     hx of "had surgery for"  . Automatic implantable cardioverter-defibrillator in situ   . Depression   . Numbness and tingling     Hx; 27f left foot  . PAD (peripheral artery disease)     Severe by PV angiogram 09/2011  . Renal artery stenosis     s/p stenting 2009  . PAD (peripheral artery disease)     s/p multiple LLE bypass grafts; left mid-distal SCA occlusion by 08/2012 duplex  . Hypertension     takes Carvedilol and Losartan daily  . Constipation     takes Miralax daily as needed and Colace daily   . Type II diabetes mellitus     takes Novolog 70/30  . CHF (congestive heart failure)     takes Lasix  daily  . History of MRSA infection   . Insomnia     TAKES TRAZODONE NIGHTLY    Past Surgical History  Procedure Laterality Date  . Turp vaporization    . Cardiac defibrillator placement  12/26/09    pacemaker combo  . Cervical epidural injection  2013  . Femoral-tibial bypass graft  09/25/2011    Procedure: BYPASS GRAFT FEMORAL-TIBIAL ARTERY;  Surgeon: Mal Misty, MD;  Location: [facility] OR;  Service: Vascular;  Laterality: Left;  Left Femoral - Anterior Tibial Bypass;  saphenous vein graft from left leg  . Intraoperative arteriogram  09/25/2011    Procedure: INTRA OPERATIVE ARTERIOGRAM;  Surgeon: Mal Misty, MD;  Location: [facility];  Service: Vascular;  Laterality: Left;  . Femoral-tibial bypass graft  02/07/2012    Procedure: BYPASS GRAFT FEMORAL-TIBIAL ARTERY;  Surgeon: Rosetta Posner, MD;  Location: [facility];  Service: Vascular;  Laterality: Left;  Thrombectomy Left Femoral - Tibial Bypass Graft  . Embolectomy  02/07/2012    Procedure: EMBOLECTOMY;  Surgeon: Rosetta Posner, MD;  Location: [facility];  Service: Vascular;  Laterality: Left;  . Femoral-tibial bypass graft  04/03/2012    Procedure: BYPASS GRAFT FEMORAL-TIBIAL ARTERY;  Surgeon: Mal Misty, MD;  Location: [facility];  Service: Vascular;  Laterality: Left;  Redo  . Insert / replace / remove pacemaker  2007  . Coronary angioplasty with stent placement  ~ 2000  . Coronary angioplasty    . Uvulopalatopharyngoplasty (uppp)/tonsillectomy/septoplasty  06/30/2003    Archie Endo 06/30/2003 (07/10/2012)  . Shoulder open rotator cuff repair Right 2001    repair of lacerated right/notes 10/11/1999  (07/10/2012)  . Foot surgery Right 03/20/2001    "have plates and screws in; didn't break it" (07/10/2012)  . Carpal tunnel release Right 2002    Archie Endo 03/20/2001 (07/10/2012)  . Biceps tendon repair Right 2001    Archie Endo 03/20/2001 (07/10/2012)  . Renal artery stent  2009  . Femoral-popliteal bypass graft Left 09/16/2012    Procedure: LEFT FEMORAL-POPLITEAL BYPASS GRAFT  WITH GORTEX Propaten Graft 6x80 Thin Wall and Left lower leg Angiogram;  Surgeon: Mal Misty, MD;  Location: [facility];  Service: Vascular;  Laterality: Left;  . Colonoscopy      Hx; of  . Tonsillectomy    . Adenoidectomy      Hx; of   . Femoral-tibial bypass graft Left 09/30/2012    Procedure: REDO LEFT FEMORAL-ANTERIOR TIBIAL ARTERY BYPASS USING COMPOSITE CEPHALIC AND BASILIC VEIN GRAFT FROM LEFT ARM;  Surgeon: Mal Misty, MD;  Location: [facility];  Service: Vascular;  Laterality: Left;  . I&d extremity Left 10/21/2012    Procedure: EXPLORATION AND DEBRIDEMENT OF LEFT GROIN WOUND;  Surgeon: Mal Misty, MD;  Location: [facility];  Service: Vascular;  Laterality: Left;  . Patch angioplasty Left 10/21/2012    Procedure: PATCH ANGIOPLASTY;  Surgeon: Mal Misty, MD;  Location: [facility];  Service: Vascular;  Laterality: Left;  . Groin debridement Left 11/12/2012    Procedure: CLOSURE INGUINAL WOUND;  Surgeon: Mal Misty, MD;  Location: [facility];  Service: Vascular;  Laterality: Left;  . Embolectomy Left 12/16/2012    Procedure: THROMBECTOMY  LEFT LEG BYPASS;  Surgeon: Elam Dutch, MD;  Location: [facility];  Service: Vascular;  Laterality: Left;  . Leg amputation above knee Left 02/04/2013    DR LAWSON  . Amputation Left 02/04/2013    Procedure: AMPUTATION ABOVE KNEE-LEFT;  Surgeon: Mal Misty, MD;  Location: [facility] [place];  Service: Vascular;  Laterality: Left;  . Removal of graft Left 04/16/2013    Procedure: I & D LEFT AKA WOUND, POSSIBLE REMOVAL OF INFECTED GORTEX GRAFT;  Surgeon: Mal Misty, MD;  Location: [facility];  Service: Vascular;  Laterality: Left;  . Abdominal aortagram N/A 09/11/2011    Procedure: ABDOMINAL Maxcine Ham;  Surgeon: Wellington Hampshire, MD;  Location: [facility] CATH LAB;  Service: Cardiovascular;  Laterality: N/A;  . Lower extremity angiogram Left 12/25/2011    Procedure: LOWER EXTREMITY ANGIOGRAM;  Surgeon: Serafina Mitchell, MD;  Location: [facility] CATH LAB;  Service: Cardiovascular;   Laterality: Left;  lt leg angio co2  . Abdominal angiogram  12/25/2011    Procedure: ABDOMINAL ANGIOGRAM;  Surgeon: Serafina Mitchell, MD;  Location: [facility] CATH LAB;  Service: Cardiovascular;;  . Lower extremity angiogram N/A 07/07/2012    Procedure: LOWER EXTREMITY ANGIOGRAM;  Surgeon: Conrad La Crosse, MD;  Location: [facility] CATH LAB;  Service: Cardiovascular;  Laterality: N/A;  . Lower extremity angiogram N/A 09/15/2012    Procedure: LOWER EXTREMITY ANGIOGRAM;  Surgeon: Serafina Mitchell, MD;  Location: [facility] CATH LAB;  Service: Cardiovascular;  Laterality: N/A;  . Upper extremity angiogram  09/15/2012  Procedure: UPPER EXTREMITY ANGIOGRAM;  Surgeon: Serafina Mitchell, MD;  Location: [facility] CATH LAB;  Service: Cardiovascular;;  . Biv icd genertaor change out N/A 11/09/2013    Procedure: BIV ICD GENERTAOR CHANGE OUT;  Surgeon: Coralyn Mark, MD;  Location: [facility] CATH LAB;  Service: Cardiovascular;  Laterality: N/A;    There were no vitals filed for this visit.  Visit Diagnosis:  Abnormality of gait  Weakness generalized  Balance problems  Impaired functional mobility and activity tolerance      Subjective Assessment - 07/07/14 1019    Subjective No new complaints. Does report not sleeping very well last night. No falls to report. Has been to [facility] since last PT visit.   Currently in Pain? Yes   Pain Score 4    Pain Location Hand  and phantom pain in limb as well   Pain Orientation Left;Right  left > right   Pain Descriptors / Indicators Aching;Sore   Pain Type Chronic pain;Phantom pain   Pain Onset More than a month ago   Pain Frequency Constant   Aggravating Factors  increased use of hands;prosthetic wear for phantom pain   Pain Relieving Factors medicine, rest, removing prosthesis          [facility] Adult PT Treatment/Exercise - 07/07/14 1023    Transfers   Sit to Stand 5: Supervision;With upper extremity assist;With armrests;From chair/3-in-1   Stand to Sit 5: Supervision;With upper extremity  assist;With armrests;To chair/3-in-1   Stand Pivot Transfers 4: Min assist  to turn to/from sitting on rollator   Stand Pivot Transfer Details (indicate cue type and reason) cues on technique for safety and balance while turning to sit on rollator and for turring to walk with rollator after sitting on rollator.   Ambulation/Gait   Ambulation/Gait Yes   Ambulation/Gait Assistance 4: Min guard   Ambulation/Gait Assistance Details multimodal cues on walker position and posture with gait. cues to correct gait deviaitons and for walker management.   Ambulation Distance (Feet) 105 Feet  x1, 60 x1 , 50 x1   Assistive device Rollator;Prosthesis   Gait Pattern Step-to pattern;Step-through pattern;Decreased stride length;Decreased step length - right;Decreased stance time - left;Decreased hip/knee flexion - left;Trunk flexed   Ambulation Surface Level;Indoor   High Level Balance   High Level Balance Activities Side stepping   High Level Balance Comments with bil UE support in parallel bars: cues on posture and ex form.   Prosthetics   Current prosthetic wear tolerance (days/week)  daily   Current prosthetic wear tolerance (#hours/day)  >/= 90% of awake hours   Residual limb condition  intact per pt report   Education Provided Residual limb care;Proper weight-bearing schedule/adjustment   Person(s) Educated Patient   Education Method Explanation;Verbal cues;Demonstration   Education Method Verbalized understanding;Needs further instruction   Donning Prosthesis Supervision   Doffing Prosthesis Modified independent (device/increased time)           PT Short Term Goals - 06/15/14 2059    PT SHORT TERM GOAL #1   Title donnes prosthesis with new suspension correctly independently. (Target Date: 06/17/14)   Status Achieved   PT SHORT TERM GOAL #2   Title demonstrates understanding of initial HEP. (Target Date: 06/17/14)   Status Achieved   PT SHORT TERM GOAL #3   Title wears prosthesis >70% of  awake hours >/= 5 days /wk. (Target Date: 06/17/14)   Status Achieved   PT SHORT TERM GOAL #4   Title ambulates 100' with rolling walker &  prosthesis with cues only for deviations. (Target Date: 06/17/14)   Status Achieved   PT SHORT TERM GOAL #5   Title stands 30sec without UE support with supervision. (Target Date: 06/17/14)   Status On-going           PT Long Term Goals - 05/18/14 0845    PT LONG TERM GOAL #1   Title wears prosthesis >80% of awake hours daily. (Target Date: 07/15/14)   Time 8   Period Weeks   Status New   PT LONG TERM GOAL #2   Title verbalize understanding of ongoing fitness plan. (Target Date: 07/15/14)   Time 8   Period Weeks   Status New   PT LONG TERM GOAL #3   Title ambulates 200' with rolling walker & prosthesis modified independent. (Target Date: 07/15/14)   Time 8   Period Weeks   Status New   PT LONG TERM GOAL #4   Title Berg Balance >18/56 (Target Date: 07/15/14)   Time 8   Period Weeks   Status New   PT LONG TERM GOAL #5   Title negotiate ramp, curb & stairs (1 rail) with rolling walker & prosthesis modified independent. (Target Date: 07/15/14)   Time 8   Period Weeks   Status New           Plan - 07/07/14 1022    Clinical Impression Statement Pt limited by calf pain today with activity, needing increased rest breaks for calf stretching. Making slow progress toward goals.   Pt will benefit from skilled therapeutic intervention in order to improve on the following deficits Abnormal gait;Cardiopulmonary status limiting activity;Decreased activity tolerance;Decreased balance;Decreased endurance;Decreased knowledge of use of DME;Decreased mobility;Decreased range of motion;Decreased strength;Pain;Other (comment)  prosthetic dependency   Rehab Potential Good   PT Frequency 2x / week   PT Duration 8 weeks   PT Treatment/Interventions ADLs/Self Care Home Management;DME Instruction;Gait training;Stair training;Functional mobility  training;Therapeutic activities;Therapeutic exercise;Balance training;Neuromuscular re-education;Patient/family education;Other (comment)  prosthetic training   PT Next Visit Plan  strength including core, endurance, Prosthetic training in gait, barriers with rollator walker   Consulted and Agree with Plan of Care Patient        Problem List Patient Active Problem List   Diagnosis Date Noted  . CVA (cerebral infarction) 06/15/2014  . Expressive aphasia 06/15/2014  . CKD (chronic kidney disease) stage 4, GFR 15-29 ml/min 06/15/2014  . DM (diabetes mellitus), type 2, uncontrolled, with renal complications 53/29/9242  . Unilateral AKA 02/09/2013  . S/P ICD (internal cardiac defibrillator) procedure 05/18/2012  . PAD (peripheral artery disease) 09/04/2011  . CAD (coronary artery disease)   . Chronic systolic congestive heart failure 07/06/2010  . Gout 09/18/2007  . Essential hypertension 01/23/2007  . Atrial fibrillation 01/23/2007  . BPH (benign prostatic hyperplasia) 01/23/2007    Willow Ora 07/07/2014, 12:51 PM  Willow Ora, PTA, [facility] [address] [place], [place] [address] [phone] 07/07/2014, 12:51 PM

## 2014-07-11 ENCOUNTER — Ambulatory Visit: Payer: Commercial Managed Care - HMO | Admitting: Rehabilitative and Restorative Service Providers"

## 2014-07-11 ENCOUNTER — Ambulatory Visit: Payer: Commercial Managed Care - HMO | Admitting: Family Medicine

## 2014-07-11 DIAGNOSIS — Z7409 Other reduced mobility: Secondary | ICD-10-CM

## 2014-07-11 DIAGNOSIS — R269 Unspecified abnormalities of gait and mobility: Secondary | ICD-10-CM

## 2014-07-11 NOTE — Therapy (Signed)
[facility] [address] [place], [place], [address] Phone: [phone]   Fax:  [phone]  Physical Therapy Treatment  Patient Details  Name: [name] MRN: [id number] Date of Birth: 1934-04-08 Referring Provider:  Laurey Morale, MD  Encounter Date: 07/11/2014      PT End of Session - 07/11/14 1043    Visit Number 13   Number of Visits 17   Date for PT Re-Evaluation 07/15/04   PT Start Time 0935   PT Stop Time 1016   PT Time Calculation (min) 41 min   Equipment Utilized During Treatment Gait belt   Activity Tolerance Patient limited by fatigue   Behavior During Therapy [facility] for tasks assessed/performed      Past Medical History  Diagnosis Date  . Gout   . Benign prostatic hypertrophy     takes Proscar daily  . Atrial fibrillation     takes Warfarin daily  . Chronic systolic dysfunction of left ventricle     a. mixed ischemic and nonischemic CM,  EF 35%. b. s/p AICD implantation.  . ED (erectile dysfunction)   . Arthritis   . Pacemaker     medtronic  . CAD (coronary artery disease)     a. s/p mid LAD stenting with DES 2008 Dr. Daneen Schick  . ICD (implantable cardiac defibrillator) in place     medtronic, Dr. Rayann Heman  . ICD (implantable cardiac defibrillator) in place   . Sleep apnea     hx of "had surgery for"  . Automatic implantable cardioverter-defibrillator in situ   . Depression   . Numbness and tingling     Hx; 77f left foot  . PAD (peripheral artery disease)     Severe by PV angiogram 09/2011  . Renal artery stenosis     s/p stenting 2009  . PAD (peripheral artery disease)     s/p multiple LLE bypass grafts; left mid-distal SCA occlusion by 08/2012 duplex  . Hypertension     takes Carvedilol and Losartan daily  . Constipation     takes Miralax daily as needed and Colace daily   . Type II diabetes mellitus     takes Novolog 70/30  . CHF (congestive heart failure)     takes Lasix  daily  . History of MRSA infection   . Insomnia     TAKES TRAZODONE NIGHTLY    Past Surgical History  Procedure Laterality Date  . Turp vaporization    . Cardiac defibrillator placement  12/26/09    pacemaker combo  . Cervical epidural injection  2013  . Femoral-tibial bypass graft  09/25/2011    Procedure: BYPASS GRAFT FEMORAL-TIBIAL ARTERY;  Surgeon: Mal Misty, MD;  Location: [facility] OR;  Service: Vascular;  Laterality: Left;  Left Femoral - Anterior Tibial Bypass;  saphenous vein graft from left leg  . Intraoperative arteriogram  09/25/2011    Procedure: INTRA OPERATIVE ARTERIOGRAM;  Surgeon: Mal Misty, MD;  Location: [facility];  Service: Vascular;  Laterality: Left;  . Femoral-tibial bypass graft  02/07/2012    Procedure: BYPASS GRAFT FEMORAL-TIBIAL ARTERY;  Surgeon: Rosetta Posner, MD;  Location: [facility];  Service: Vascular;  Laterality: Left;  Thrombectomy Left Femoral - Tibial Bypass Graft  . Embolectomy  02/07/2012    Procedure: EMBOLECTOMY;  Surgeon: Rosetta Posner, MD;  Location: [facility];  Service: Vascular;  Laterality: Left;  . Femoral-tibial bypass graft  04/03/2012    Procedure: BYPASS GRAFT FEMORAL-TIBIAL ARTERY;  Surgeon: Mal Misty, MD;  Location: [facility];  Service: Vascular;  Laterality: Left;  Redo  . Insert / replace / remove pacemaker  2007  . Coronary angioplasty with stent placement  ~ 2000  . Coronary angioplasty    . Uvulopalatopharyngoplasty (uppp)/tonsillectomy/septoplasty  06/30/2003    Archie Endo 06/30/2003 (07/10/2012)  . Shoulder open rotator cuff repair Right 2001    repair of lacerated right/notes 10/11/1999  (07/10/2012)  . Foot surgery Right 03/20/2001    "have plates and screws in; didn't break it" (07/10/2012)  . Carpal tunnel release Right 2002    Archie Endo 03/20/2001 (07/10/2012)  . Biceps tendon repair Right 2001    Archie Endo 03/20/2001 (07/10/2012)  . Renal artery stent  2009  . Femoral-popliteal bypass graft Left 09/16/2012    Procedure: LEFT FEMORAL-POPLITEAL BYPASS GRAFT  WITH GORTEX Propaten Graft 6x80 Thin Wall and Left lower leg Angiogram;  Surgeon: Mal Misty, MD;  Location: [facility];  Service: Vascular;  Laterality: Left;  . Colonoscopy      Hx; of  . Tonsillectomy    . Adenoidectomy      Hx; of   . Femoral-tibial bypass graft Left 09/30/2012    Procedure: REDO LEFT FEMORAL-ANTERIOR TIBIAL ARTERY BYPASS USING COMPOSITE CEPHALIC AND BASILIC VEIN GRAFT FROM LEFT ARM;  Surgeon: Mal Misty, MD;  Location: [facility];  Service: Vascular;  Laterality: Left;  . I&d extremity Left 10/21/2012    Procedure: EXPLORATION AND DEBRIDEMENT OF LEFT GROIN WOUND;  Surgeon: Mal Misty, MD;  Location: [facility];  Service: Vascular;  Laterality: Left;  . Patch angioplasty Left 10/21/2012    Procedure: PATCH ANGIOPLASTY;  Surgeon: Mal Misty, MD;  Location: [facility];  Service: Vascular;  Laterality: Left;  . Groin debridement Left 11/12/2012    Procedure: CLOSURE INGUINAL WOUND;  Surgeon: Mal Misty, MD;  Location: [facility];  Service: Vascular;  Laterality: Left;  . Embolectomy Left 12/16/2012    Procedure: THROMBECTOMY  LEFT LEG BYPASS;  Surgeon: Elam Dutch, MD;  Location: [facility];  Service: Vascular;  Laterality: Left;  . Leg amputation above knee Left 02/04/2013    DR LAWSON  . Amputation Left 02/04/2013    Procedure: AMPUTATION ABOVE KNEE-LEFT;  Surgeon: Mal Misty, MD;  Location: [facility];  Service: Vascular;  Laterality: Left;  . Removal of graft Left 04/16/2013    Procedure: I & D LEFT AKA WOUND, POSSIBLE REMOVAL OF INFECTED GORTEX GRAFT;  Surgeon: Mal Misty, MD;  Location: [facility];  Service: Vascular;  Laterality: Left;  . Abdominal aortagram N/A 09/11/2011    Procedure: ABDOMINAL Maxcine Ham;  Surgeon: Wellington Hampshire, MD;  Location: [facility] CATH LAB;  Service: Cardiovascular;  Laterality: N/A;  . Lower extremity angiogram Left 12/25/2011    Procedure: LOWER EXTREMITY ANGIOGRAM;  Surgeon: Serafina Mitchell, MD;  Location: [facility] CATH LAB;  Service: Cardiovascular;   Laterality: Left;  lt leg angio co2  . Abdominal angiogram  12/25/2011    Procedure: ABDOMINAL ANGIOGRAM;  Surgeon: Serafina Mitchell, MD;  Location: [facility] CATH LAB;  Service: Cardiovascular;;  . Lower extremity angiogram N/A 07/07/2012    Procedure: LOWER EXTREMITY ANGIOGRAM;  Surgeon: Conrad La Crosse, MD;  Location: [facility] CATH LAB;  Service: Cardiovascular;  Laterality: N/A;  . Lower extremity angiogram N/A 09/15/2012    Procedure: LOWER EXTREMITY ANGIOGRAM;  Surgeon: Serafina Mitchell, MD;  Location: [facility] CATH LAB;  Service: Cardiovascular;  Laterality: N/A;  . Upper extremity angiogram  09/15/2012  Procedure: UPPER EXTREMITY ANGIOGRAM;  Surgeon: Serafina Mitchell, MD;  Location: [facility] CATH LAB;  Service: Cardiovascular;;  . Biv icd genertaor change out N/A 11/09/2013    Procedure: BIV ICD GENERTAOR CHANGE OUT;  Surgeon: Coralyn Mark, MD;  [facility] C[facility] LAB;  Service: Cardiovascular;  Laterality: N/A;    There were no vitals filed for this visit.  Visit Diagnosis:  Abnormality of gait  Impaired functional mobility and activity tolerance      Subjective Assessment - 07/11/14 0936    Subjective Patient reports he is walking in the house with the walker.  He is wearing prosthesis 6-8 hours/day.  He reports he is not ready to finish PT this week and wants to reschedule.  "I want to walk without the walker.".  L hand tingling x a couple of weeks, pt forgot to mention it to anyone before now.   Currently in Pain? Yes   Pain Score 4    Pain Location Leg  phantom pain   Pain Orientation Left   Pain Descriptors / Indicators Aching   Pain Type Chronic pain;Phantom pain   Aggravating Factors  prosthetic wear   Pain Relieving Factors medicine, rest, removing prosthesis    Patient [facility]step at [facility] 2x/week   BP=134/74, HR=66bpm        [facility] Adult PT Treatment/Exercise - 07/11/14 0955    Ambulation/Gait   Ambulation/Gait Yes   Ambulation/Gait Assistance 4: Min guard;4: Min assist   Ambulation/Gait  Assistance Details verbal and tactile cues on posture, step length, and staying within rollater RW   Ambulation Distance (Feet) 115 Feet  x 2 repetitions with cues on turning safely to sit on rollat   Assistive device Rollator   Gait Pattern --  step to pattern R leg-cues for even step length   Ambulation Surface Level;Indoor   Gait Comments Ambulated x 50 ft with rollater RW and min A at end of session   Posture/Postural Control   Posture Comments Postural training sitting on physioball with lateral weight shifting, anterior/posterior weight shifting with min A, seated UE movements for proximal stability and scapular retraction seated   Dynamic Standing Balance   Dynamic Standing - Balance Activities --  Wall bumps, posterior to anterior movement decreasing UEs   Dynamic Standing - Comments Pt requires min A and has Rollater in front for balance            PT Short Term Goals - 06/15/14 2059    PT SHORT TERM GOAL #1   Title donnes prosthesis with new suspension correctly independently. (Target Date: 06/17/14)   Status Achieved   PT SHORT TERM GOAL #2   Title demonstrates understanding of initial HEP. (Target Date: 06/17/14)   Status Achieved   PT SHORT TERM GOAL #3   Title wears prosthesis >70% of awake hours >/= 5 days /wk. (Target Date: 06/17/14)   Status Achieved   PT SHORT TERM GOAL #4   Title ambulates 100' with rolling walker & prosthesis with cues only for deviations. (Target Date: 06/17/14)   Status Achieved   PT SHORT TERM GOAL #5   Title stands 30sec without UE support with supervision. (Target Date: 06/17/14)   Status On-going           PT Long Term Goals - 05/18/14 0845    PT LONG TERM GOAL #1   Title wears prosthesis >80% of awake hours daily. (Target Date: 07/15/14)   Time 8   Period Weeks   Status New  PT LONG TERM GOAL #2   Title verbalize understanding of ongoing fitness plan. (Target Date: 07/15/14)   Time 8   Period Weeks   Status New   PT LONG TERM  GOAL #3   Title ambulates 200' with rolling walker & prosthesis modified independent. (Target Date: 07/15/14)   Time 8   Period Weeks   Status New   PT LONG TERM GOAL #4   Title Berg Balance >18/56 (Target Date: 07/15/14)   Time 8   Period Weeks   Status New   PT LONG TERM GOAL #5   Title negotiate ramp, curb & stairs (1 rail) with rolling walker & prosthesis modified independent. (Target Date: 07/15/14)   Time 8   Period Weeks   Status New               Plan - 07/11/14 1043    Clinical Impression Statement The patient reports that he wants to continue with therapy until he can walk without a RW at home.  PT discussed safety and need for assistive device at this time.  Recommended patient discuss plan for PT with primary PT at next visit.   PT Next Visit Plan Check goals, renew vs. d/c (?patient missed appts per report and wishes to continue), progress gait with rollater RW, standing weight shift/postural control   Consulted and Agree with Plan of Care Patient        Problem List Patient Active Problem List   Diagnosis Date Noted  . CVA (cerebral infarction) 06/15/2014  . Expressive aphasia 06/15/2014  . CKD (chronic kidney disease) stage 4, GFR 15-29 ml/min 06/15/2014  . DM (diabetes mellitus), type 2, uncontrolled, with renal complications 51/70/0174  . Unilateral AKA 02/09/2013  . S/P ICD (internal cardiac defibrillator) procedure 05/18/2012  . PAD (peripheral artery disease) 09/04/2011  . CAD (coronary artery disease)   . Chronic systolic congestive heart failure 07/06/2010  . Gout 09/18/2007  . Essential hypertension 01/23/2007  . Atrial fibrillation 01/23/2007  . BPH (benign prostatic hyperplasia) 01/23/2007    Dimitriy Carreras, PT 07/11/2014, 10:46 AM  [facility] [address] [place], [place], [address] Phone: [phone]   Fax:  [phone]

## 2014-07-12 ENCOUNTER — Encounter: Payer: Self-pay | Admitting: Family Medicine

## 2014-07-12 ENCOUNTER — Ambulatory Visit (INDEPENDENT_AMBULATORY_CARE_PROVIDER_SITE_OTHER): Payer: Commercial Managed Care - HMO | Admitting: Family Medicine

## 2014-07-12 VITALS — BP 143/82 | HR 68 | Temp 98.0°F

## 2014-07-12 DIAGNOSIS — E1129 Type 2 diabetes mellitus with other diabetic kidney complication: Secondary | ICD-10-CM | POA: Diagnosis not present

## 2014-07-12 DIAGNOSIS — R4701 Aphasia: Secondary | ICD-10-CM | POA: Diagnosis not present

## 2014-07-12 DIAGNOSIS — E1165 Type 2 diabetes mellitus with hyperglycemia: Secondary | ICD-10-CM

## 2014-07-12 DIAGNOSIS — IMO0002 Reserved for concepts with insufficient information to code with codable children: Secondary | ICD-10-CM

## 2014-07-12 DIAGNOSIS — I4819 Other persistent atrial fibrillation: Secondary | ICD-10-CM

## 2014-07-12 DIAGNOSIS — N184 Chronic kidney disease, stage 4 (severe): Secondary | ICD-10-CM

## 2014-07-12 DIAGNOSIS — I481 Persistent atrial fibrillation: Secondary | ICD-10-CM

## 2014-07-12 DIAGNOSIS — E0842 Diabetes mellitus due to underlying condition with diabetic polyneuropathy: Secondary | ICD-10-CM

## 2014-07-12 LAB — BASIC METABOLIC PANEL
BUN: 34 mg/dL — AB (ref 6–23)
CALCIUM: 9.1 mg/dL (ref 8.4–10.5)
CO2: 26 mEq/L (ref 19–32)
Chloride: 106 mEq/L (ref 96–112)
Creatinine, Ser: 1.87 mg/dL — ABNORMAL HIGH (ref 0.40–1.50)
GFR: 37.27 mL/min — AB (ref >60.00)
GLUCOSE: 84 mg/dL (ref 70–99)
Potassium: 5 mEq/L (ref 3.5–5.1)
Sodium: 138 mEq/L (ref 135–145)

## 2014-07-12 MED ORDER — GABAPENTIN 100 MG PO CAPS: 100.0000 mg | ORAL_CAPSULE | Freq: Three times a day (TID) | ORAL | Status: DC

## 2014-07-12 NOTE — Progress Notes (Signed)
   Subjective:    Patient ID: [name], male    DOB: Jun 20, 1934, 80 y.o.   MRN: [id number]  HPI Here to discuss some ongoing expressive aphasia, 4 weeks of tingling and numbness in the left hand, and some intermittent lightheadedness. He has severe neuropathy of course, and most likely this process is now involving the left hand. He asks if he is on too much medication. No chest pain or SOB. His appetite is good. His glucoses are stable at home, but we do note that his A1c in April was 7.9. His BP is stable.    Review of Systems  Constitutional: Negative.   Respiratory: Negative.   Cardiovascular: Negative.   Neurological: Positive for speech difficulty, light-headedness and numbness. Negative for dizziness, tremors, seizures, syncope, facial asymmetry, weakness and headaches.       Objective:   Physical Exam  Constitutional: He is oriented to person, place, and time. He appears well-developed and well-nourished.  Alert, speech is clear   Cardiovascular: Normal rate, normal heart sounds and intact distal pulses.   Irregular rhythm   Pulmonary/Chest: Effort normal and breath sounds normal.  Neurological: He is alert and oriented to person, place, and time. No cranial nerve deficit.          Assessment & Plan:  He may indeed be having some medication side effects. We will decrease the Gabapentin to 100 mg tid. Check a BMET today. His HTN is stable.

## 2014-07-12 NOTE — Progress Notes (Signed)
Pre visit review using our clinic review tool, if applicable. No additional management support is needed unless otherwise documented below in the visit note. Pt unable to stand and weigh.   

## 2014-07-14 ENCOUNTER — Encounter: Payer: Self-pay | Admitting: Physical Therapy

## 2014-07-14 ENCOUNTER — Ambulatory Visit: Payer: Commercial Managed Care - HMO | Admitting: Physical Therapy

## 2014-07-14 DIAGNOSIS — R2689 Other abnormalities of gait and mobility: Secondary | ICD-10-CM

## 2014-07-14 DIAGNOSIS — R531 Weakness: Secondary | ICD-10-CM

## 2014-07-14 DIAGNOSIS — R269 Unspecified abnormalities of gait and mobility: Secondary | ICD-10-CM | POA: Diagnosis not present

## 2014-07-14 DIAGNOSIS — Z89612 Acquired absence of left leg above knee: Secondary | ICD-10-CM

## 2014-07-14 DIAGNOSIS — Z7409 Other reduced mobility: Secondary | ICD-10-CM

## 2014-07-14 NOTE — Therapy (Signed)
[facility] [address] [place], [place], [address] Phone: [phone]   Fax:  [phone]  Physical Therapy Treatment  Patient Details  Name: [name] MRN: [id number] Date of Birth: May 14, 1934 Referring Provider:  Laurey Morale, MD  Encounter Date: 07/14/2014      PT End of Session - 07/14/14 1014    Visit Number 14   Number of Visits 17   Date for PT Re-Evaluation 07/15/04   PT Start Time 4580   PT Stop Time 1053   PT Time Calculation (min) 39 min   Equipment Utilized During Treatment Gait belt   Activity Tolerance Patient limited by fatigue   Behavior During Therapy [facility] for tasks assessed/performed      Past Medical History  Diagnosis Date  . Gout   . Benign prostatic hypertrophy     takes Proscar daily  . Atrial fibrillation     takes Warfarin daily  . Chronic systolic dysfunction of left ventricle     a. mixed ischemic and nonischemic CM,  EF 35%. b. s/p AICD implantation.  . ED (erectile dysfunction)   . Arthritis   . Pacemaker     medtronic  . CAD (coronary artery disease)     a. s/p mid LAD stenting with DES 2008 Dr. Daneen Schick  . ICD (implantable cardiac defibrillator) in place     medtronic, Dr. Rayann Heman  . ICD (implantable cardiac defibrillator) in place   . Sleep apnea     hx of "had surgery for"  . Automatic implantable cardioverter-defibrillator in situ   . Depression   . Numbness and tingling     Hx; 43f left foot  . PAD (peripheral artery disease)     Severe by PV angiogram 09/2011  . Renal artery stenosis     s/p stenting 2009  . PAD (peripheral artery disease)     s/p multiple LLE bypass grafts; left mid-distal SCA occlusion by 08/2012 duplex  . Hypertension     takes Carvedilol and Losartan daily  . Constipation     takes Miralax daily as needed and Colace daily   . Type II diabetes mellitus     takes Novolog 70/30  . CHF (congestive heart failure)     takes Lasix  daily  . History of MRSA infection   . Insomnia     TAKES TRAZODONE NIGHTLY    Past Surgical History  Procedure Laterality Date  . Turp vaporization    . Cardiac defibrillator placement  12/26/09    pacemaker combo  . Cervical epidural injection  2013  . Femoral-tibial bypass graft  09/25/2011    Procedure: BYPASS GRAFT FEMORAL-TIBIAL ARTERY;  Surgeon: Mal Misty, MD;  Location: [facility] OR;  Service: Vascular;  Laterality: Left;  Left Femoral - Anterior Tibial Bypass;  saphenous vein graft from left leg  . Intraoperative arteriogram  09/25/2011    Procedure: INTRA OPERATIVE ARTERIOGRAM;  Surgeon: Mal Misty, MD;  Location: [place];  Service: Vascular;  Laterality: Left;  . Femoral-tibial bypass graft  02/07/2012    Procedure: BYPASS GRAFT FEMORAL-TIBIAL ARTERY;  Surgeon: Rosetta Posner, MD;  Location: [place];  Service: Vascular;  Laterality: Left;  Thrombectomy Left Femoral - Tibial Bypass Graft  . Embolectomy  02/07/2012    Procedure: EMBOLECTOMY;  Surgeon: Rosetta Posner, MD;  Location: [place];  Service: Vascular;  Laterality: Left;  . Femoral-tibial bypass graft  04/03/2012    Procedure: BYPASS GRAFT FEMORAL-TIBIAL ARTERY;  Surgeon: Mal Misty, MD;  Location: [place];  Service: Vascular;  Laterality: Left;  Redo  . Insert / replace / remove pacemaker  2007  . Coronary angioplasty with stent placement  ~ 2000  . Coronary angioplasty    . Uvulopalatopharyngoplasty (uppp)/tonsillectomy/septoplasty  06/30/2003    Archie Endo 06/30/2003 (07/10/2012)  . Shoulder open rotator cuff repair Right 2001    repair of lacerated right/notes 10/11/1999  (07/10/2012)  . Foot surgery Right 03/20/2001    "have plates and screws in; didn't break it" (07/10/2012)  . Carpal tunnel release Right 2002    Archie Endo 03/20/2001 (07/10/2012)  . Biceps tendon repair Right 2001    Archie Endo 03/20/2001 (07/10/2012)  . Renal artery stent  2009  . Femoral-popliteal bypass graft Left 09/16/2012    Procedure: LEFT FEMORAL-POPLITEAL BYPASS GRAFT  WITH GORTEX Propaten Graft 6x80 Thin Wall and Left lower leg Angiogram;  Surgeon: Mal Misty, MD;  Location: [place];  Service: Vascular;  Laterality: Left;  . Colonoscopy      Hx; of  . Tonsillectomy    . Adenoidectomy      Hx; of   . Femoral-tibial bypass graft Left 09/30/2012    Procedure: REDO LEFT FEMORAL-ANTERIOR TIBIAL ARTERY BYPASS USING COMPOSITE CEPHALIC AND BASILIC VEIN GRAFT FROM LEFT ARM;  Surgeon: Mal Misty, MD;  Location: [facility];  Service: Vascular;  Laterality: Left;  . I&d extremity Left 10/21/2012    Procedure: EXPLORATION AND DEBRIDEMENT OF LEFT GROIN WOUND;  Surgeon: Mal Misty, MD;  Location: [place];  Service: Vascular;  Laterality: Left;  . Patch angioplasty Left 10/21/2012    Procedure: PATCH ANGIOPLASTY;  Surgeon: Mal Misty, MD;  Location: [place];  Service: Vascular;  Laterality: Left;  . Groin debridement Left 11/12/2012    Procedure: CLOSURE INGUINAL WOUND;  Surgeon: Mal Misty, MD;  Location: [facility];  Service: Vascular;  Laterality: Left;  . Embolectomy Left 12/16/2012    Procedure: THROMBECTOMY  LEFT LEG BYPASS;  Surgeon: Elam Dutch, MD;  Location: [place];  Service: Vascular;  Laterality: Left;  . Leg amputation above knee Left 02/04/2013    DR LAWSON  . Amputation Left 02/04/2013    Procedure: AMPUTATION ABOVE KNEE-LEFT;  Surgeon: Mal Misty, MD;  Location: [place];  Service: Vascular;  Laterality: Left;  . Removal of graft Left 04/16/2013    Procedure: I & D LEFT AKA WOUND, POSSIBLE REMOVAL OF INFECTED GORTEX GRAFT;  Surgeon: Mal Misty, MD;  Location: [place];  Service: Vascular;  Laterality: Left;  . Abdominal aortagram N/A 09/11/2011    Procedure: ABDOMINAL Maxcine Ham;  Surgeon: Wellington Hampshire, MD;  Location: [facility] CATH LAB;  Service: Cardiovascular;  Laterality: N/A;  . Lower extremity angiogram Left 12/25/2011    Procedure: LOWER EXTREMITY ANGIOGRAM;  Surgeon: Serafina Mitchell, MD;  Location: [facility] CATH LAB;  Service: Cardiovascular;   Laterality: Left;  lt leg angio co2  . Abdominal angiogram  12/25/2011    Procedure: ABDOMINAL ANGIOGRAM;  Surgeon: Serafina Mitchell, MD;  Location: [facility] CATH LAB;  Service: Cardiovascular;;  . Lower extremity angiogram N/A 07/07/2012    Procedure: LOWER EXTREMITY ANGIOGRAM;  Surgeon: Conrad Larsen Bay, MD;  Location: [facility] CATH LAB;  Service: Cardiovascular;  Laterality: N/A;  . Lower extremity angiogram N/A 09/15/2012    Procedure: LOWER EXTREMITY ANGIOGRAM;  Surgeon: Serafina Mitchell, MD;  Location: [facility] CATH LAB;  Service: Cardiovascular;  Laterality: N/A;  . Upper extremity angiogram  09/15/2012  Procedure: UPPER EXTREMITY ANGIOGRAM;  Surgeon: Serafina Mitchell, MD;  Location: [facility] CATH LAB;  Service: Cardiovascular;;  . Biv icd genertaor change out N/A 11/09/2013    Procedure: BIV ICD GENERTAOR CHANGE OUT;  Surgeon: Coralyn Mark, MD;  Location: [facility] CATH LAB;  Service: Cardiovascular;  Laterality: N/A;    There were no vitals filed for this visit.  Visit Diagnosis:  Abnormality of gait  Impaired functional mobility and activity tolerance  Weakness generalized  Balance problems  Status post above knee amputation of left lower extremity      Subjective Assessment - 07/14/14 1020    Subjective Patient states he has been going to the [facility] using the recumbent stepper with BUE; 6 minutes of work, 3 minutes of rest for 3 bouts. Wearing prosthesis all awake hours with no isues.    Currently in Pain? No/denies     Pt able to ambulate 100' x1, 40' x1, with rollator and supervision on indoor level surfaces with prosthesis. Negotiated up and down ramps and curbs with rollator and min A with constant verbal cueing for safety and sequencing. Fatigue and weakness of BUE are limiting factor.        [facility] PT Assessment - 07/14/14 1014    Berg Balance Test   Sit to Stand Able to stand  independently using hands   Standing Unsupported Able to stand 30 seconds unsupported   Sitting with Back Unsupported but Feet  Supported on Floor or Stool Able to sit safely and securely 2 minutes   Stand to Sit Controls descent by using hands   Transfers Able to transfer with verbal cueing and /or supervision   Standing Unsupported with Eyes Closed Unable to keep eyes closed 3 seconds but stays steady   Standing Ubsupported with Feet Together Needs help to attain position and unable to hold for 15 seconds   From Standing, Reach Forward with Outstretched Arm Reaches forward but needs supervision   From Standing Position, Pick up Object from Floor Able to pick up shoe, needs supervision   From Standing Position, Turn to Look Behind Over each Shoulder Needs supervision when turning   Turn 360 Degrees Needs assistance while turning   Standing Unsupported, Alternately Place Feet on Step/Stool Needs assistance to keep from falling or unable to try   Standing Unsupported, One Foot in Front Loses balance while stepping or standing   Standing on One Leg Unable to try or needs assist to prevent fall   Total Score 20                             PT Education - 07/14/14 1014    Education provided Yes   Education Details use of rollator on ramps and curbs, further instruction on chest and back exercises at the [facility], proper foot placement during gait with rollator.    Person(s) Educated Patient   Methods Explanation;Demonstration;Verbal cues   Comprehension Verbalized understanding;Returned demonstration;Verbal cues required;Need further instruction          PT Short Term Goals - 07/14/14 1635    PT SHORT TERM GOAL #1   Title donnes prosthesis with new suspension correctly independently. (Target Date: 06/17/14)   Status Achieved   PT SHORT TERM GOAL #2   Title demonstrates understanding of initial HEP. (Target Date: 06/17/14)   Status Achieved   PT SHORT TERM GOAL #3   Title wears prosthesis >70% of awake hours >/= 5 days /wk. (  Target Date: 06/17/14)   Status Achieved   PT SHORT TERM GOAL #4   Title  ambulates 100' with rolling walker & prosthesis with cues only for deviations. (Target Date: 06/17/14)   Status Achieved   PT SHORT TERM GOAL #5   Title stands 30sec without UE support with supervision. (Target Date: 06/17/14)   Baseline MET 07/14/14   Status Achieved   Additional Short Term Goals   Additional Short Term Goals Yes   PT SHORT TERM GOAL #6   Title pt ambulates 150' with rollater with supervision. (Target Date 08/11/14)   Time 4   Period Weeks   Status New   PT SHORT TERM GOAL #7   Title pt negotiates ramps and curb with rollator with supervision. (Target Date 08/11/14)   Time 4   Period Weeks   Status New   PT SHORT TERM GOAL #8   Title pt able to transfer safely from [facility] to chair with armrests with supervision. (Target Date 08/11/14)   Time 4   Period Weeks   Status New           PT Long Term Goals - 07/14/14 1648    PT LONG TERM GOAL #1   Title wears prosthesis >80% of awake hours daily. (Target Date: 07/15/14)   Baseline MET 07/14/14   Time 8   Period Weeks   Status Achieved   PT LONG TERM GOAL #2   Title verbalize understanding of ongoing fitness plan. (NEW Target Date: 09/09/14)   Baseline MET 07/14/14 PT is continuing to update   Time 8   Period Weeks   Status On-going   PT LONG TERM GOAL #3   Title ambulates 200' with rolling walker & prosthesis modified independent. (NEW Target Date: 09/09/14)   Baseline 07/14/14 progressing. pt able to ambulate for 100' with rollator with supervision   Time 8   Period Weeks   Status On-going   PT LONG TERM GOAL #4   Title Berg Balance >18/56 (Target Date: 07/15/14)   Baseline MET 07/14/14. BERG 20/56   Time 8   Period Weeks   Status Achieved   PT LONG TERM GOAL #5   Title negotiate ramp, curb & stairs (1 rail) with rolling walker & prosthesis modified independent. (New Target Date: 09/09/14)   Baseline 07/14/14 progressing. pt able to negotiate ramp and curb with rollator with minA and constant verbal cues   Time 8   Period  Weeks   Status On-going   PT LONG TERM GOAL #6   Title BERG balance > /= 24/56   Time 8   Period Weeks   Status New               Plan - 07/14/14 1014    Clinical Impression Statement Pt performed greater bouts of ambulation but required frequent seated rest breaks. C/O of weakness in BUE.    Pt will benefit from skilled therapeutic intervention in order to improve on the following deficits Abnormal gait;Cardiopulmonary status limiting activity;Decreased activity tolerance;Decreased balance;Decreased endurance;Decreased knowledge of use of DME;Decreased mobility;Decreased range of motion;Decreased strength;Pain;Other (comment)   Rehab Potential Good   PT Frequency 2x / week   PT Duration 8 weeks   PT Treatment/Interventions ADLs/Self Care Home Management;DME Instruction;Gait training;Stair training;Functional mobility training;Therapeutic activities;Therapeutic exercise;Balance training;Neuromuscular re-education;Patient/family education;Other (comment)   PT Next Visit Plan Continue with PT for another 8wks. Pt progressing towards goals but needs further instruction for safe ambulation with rollator   Consulted and Agree with  Plan of Care Patient        Problem List Patient Active Problem List   Diagnosis Date Noted  . Diabetic polyneuropathy associated with diabetes mellitus due to underlying condition 07/12/2014  . CVA (cerebral infarction) 06/15/2014  . Expressive aphasia 06/15/2014  . CKD (chronic kidney disease) stage 4, GFR 15-29 ml/min 06/15/2014  . DM (diabetes mellitus), type 2, uncontrolled, with renal complications 07/37/1062  . Unilateral AKA 02/09/2013  . S/P ICD (internal cardiac defibrillator) procedure 05/18/2012  . PAD (peripheral artery disease) 09/04/2011  . CAD (coronary artery disease)   . Chronic systolic congestive heart failure 07/06/2010  . Gout 09/18/2007  . Essential hypertension 01/23/2007  . Atrial fibrillation 01/23/2007  . BPH (benign  prostatic hyperplasia) 01/23/2007   Jamey Reas, PT, DPT PT Specializing in [place] 07/14/2014 4:51 PM Phone:  [phone]  Fax:  [phone] [facility] [address] [place], [place] [address] [place] Hirota Blair Jadie [place], [place] 07/14/2014, 4:48 PM  [facility] [address] [place], [place], [address] Phone: [phone]   Fax:  [phone]

## 2014-07-18 ENCOUNTER — Ambulatory Visit: Payer: Commercial Managed Care - HMO

## 2014-07-20 ENCOUNTER — Encounter: Payer: Self-pay | Admitting: Physical Therapy

## 2014-07-20 ENCOUNTER — Ambulatory Visit: Payer: Commercial Managed Care - HMO | Admitting: Physical Therapy

## 2014-07-20 DIAGNOSIS — Z89612 Acquired absence of left leg above knee: Secondary | ICD-10-CM

## 2014-07-20 DIAGNOSIS — R269 Unspecified abnormalities of gait and mobility: Secondary | ICD-10-CM

## 2014-07-20 DIAGNOSIS — Z7409 Other reduced mobility: Secondary | ICD-10-CM

## 2014-07-20 DIAGNOSIS — R531 Weakness: Secondary | ICD-10-CM

## 2014-07-20 DIAGNOSIS — R2689 Other abnormalities of gait and mobility: Secondary | ICD-10-CM

## 2014-07-20 NOTE — Therapy (Signed)
[facility] [address] [place], [place], [address] Phone: [phone]   Fax:  [phone]  Physical Therapy Treatment  Patient Details  Name: [name] MRN: [id number] Date of Birth: 24-Nov-1934 Referring Provider:  Laurey Morale, MD  Encounter Date: 07/20/2014      PT End of Session - 07/20/14 1100    Visit Number 15   Number of Visits 30   Date for PT Re-Evaluation 09/09/14   PT Start Time 1100   PT Stop Time 1145   PT Time Calculation (min) 45 min   Equipment Utilized During Treatment Gait belt   Activity Tolerance Patient limited by fatigue   Behavior During Therapy [facility] for tasks assessed/performed      Past Medical History  Diagnosis Date  . Gout   . Benign prostatic hypertrophy     takes Proscar daily  . Atrial fibrillation     takes Warfarin daily  . Chronic systolic dysfunction of left ventricle     a. mixed ischemic and nonischemic CM,  EF 35%. b. s/p AICD implantation.  . ED (erectile dysfunction)   . Arthritis   . Pacemaker     medtronic  . CAD (coronary artery disease)     a. s/p mid LAD stenting with DES 2008 Dr. Daneen Schick  . ICD (implantable cardiac defibrillator) in place     medtronic, Dr. Rayann Heman  . ICD (implantable cardiac defibrillator) in place   . Sleep apnea     hx of "had surgery for"  . Automatic implantable cardioverter-defibrillator in situ   . Depression   . Numbness and tingling     Hx; 57fleft foot  . PAD (peripheral artery disease)     Severe by PV angiogram 09/2011  . Renal artery stenosis     s/p stenting 2009  . PAD (peripheral artery disease)     s/p multiple LLE bypass grafts; left mid-distal SCA occlusion by 08/2012 duplex  . Hypertension     takes Carvedilol and Losartan daily  . Constipation     takes Miralax daily as needed and Colace daily   . Type II diabetes mellitus     takes Novolog 70/30  . CHF (congestive heart failure)     takes Lasix  daily  . History of MRSA infection   . Insomnia     TAKES TRAZODONE NIGHTLY    Past Surgical History  Procedure Laterality Date  . Turp vaporization    . Cardiac defibrillator placement  12/26/09    pacemaker combo  . Cervical epidural injection  2013  . Femoral-tibial bypass graft  09/25/2011    Procedure: BYPASS GRAFT FEMORAL-TIBIAL ARTERY;  Surgeon: JMal Misty MD;  [facility]  Service: Vascular;  Laterality: Left;  Left Femoral - Anterior Tibial Bypass;  saphenous vein graft from left leg  . Intraoperative arteriogram  09/25/2011    Procedure: INTRA OPERATIVE ARTERIOGRAM;  Surgeon: JMal Misty MD;  [place] [facility]  Service: Vascular;  Laterality: Left;  . Femoral-tibial bypass graft  02/07/2012    Procedure: BYPASS GRAFT FEMORAL-TIBIAL ARTERY;  Surgeon: TRosetta Posner MD;  [facility]  Service: Vascular;  Laterality: Left;  Thrombectomy Left Femoral - Tibial Bypass Graft  . Embolectomy  02/07/2012    Procedure: EMBOLECTOMY;  Surgeon: TRosetta Posner MD;  [facility]  Service: Vascular;  Laterality: Left;  . Femoral-tibial bypass graft  04/03/2012    Procedure: BYPASS GRAFT FEMORAL-TIBIAL ARTERY;  Surgeon: Mal Misty, MD;  Location: [facility];  Service: Vascular;  Laterality: Left;  Redo  . Insert / replace / remove pacemaker  2007  . Coronary angioplasty with stent placement  ~ 2000  . Coronary angioplasty    . Uvulopalatopharyngoplasty (uppp)/tonsillectomy/septoplasty  06/30/2003    Archie Endo 06/30/2003 (07/10/2012)  . Shoulder open rotator cuff repair Right 2001    repair of lacerated right/notes 10/11/1999  (07/10/2012)  . Foot surgery Right 03/20/2001    "have plates and screws in; didn't break it" (07/10/2012)  . Carpal tunnel release Right 2002    Archie Endo 03/20/2001 (07/10/2012)  . Biceps tendon repair Right 2001    Archie Endo 03/20/2001 (07/10/2012)  . Renal artery stent  2009  . Femoral-popliteal bypass graft Left 09/16/2012    Procedure: LEFT FEMORAL-POPLITEAL BYPASS GRAFT  WITH GORTEX Propaten Graft 6x80 Thin Wall and Left lower leg Angiogram;  Surgeon: Mal Misty, MD;  Location: [place];  Service: Vascular;  Laterality: Left;  . Colonoscopy      Hx; of  . Tonsillectomy    . Adenoidectomy      Hx; of   . Femoral-tibial bypass graft Left 09/30/2012    Procedure: REDO LEFT FEMORAL-ANTERIOR TIBIAL ARTERY BYPASS USING COMPOSITE CEPHALIC AND BASILIC VEIN GRAFT FROM LEFT ARM;  Surgeon: Mal Misty, MD;  Location: [facility];  Service: Vascular;  Laterality: Left;  . I&d extremity Left 10/21/2012    Procedure: EXPLORATION AND DEBRIDEMENT OF LEFT GROIN WOUND;  Surgeon: Mal Misty, MD;  Location: [facility];  Service: Vascular;  Laterality: Left;  . Patch angioplasty Left 10/21/2012    Procedure: PATCH ANGIOPLASTY;  Surgeon: Mal Misty, MD;  Location: [place];  Service: Vascular;  Laterality: Left;  . Groin debridement Left 11/12/2012    Procedure: CLOSURE INGUINAL WOUND;  Surgeon: Mal Misty, MD;  Location: [facility];  Service: Vascular;  Laterality: Left;  . Embolectomy Left 12/16/2012    Procedure: THROMBECTOMY  LEFT LEG BYPASS;  Surgeon: Elam Dutch, MD;  Location: [facility];  Service: Vascular;  Laterality: Left;  . Leg amputation above knee Left 02/04/2013    DR LAWSON  . Amputation Left 02/04/2013    Procedure: AMPUTATION ABOVE KNEE-LEFT;  Surgeon: Mal Misty, MD;  Location: [place];  Service: Vascular;  Laterality: Left;  . Removal of graft Left 04/16/2013    Procedure: I & D LEFT AKA WOUND, POSSIBLE REMOVAL OF INFECTED GORTEX GRAFT;  Surgeon: Mal Misty, MD;  Location: [facility];  Service: Vascular;  Laterality: Left;  . Abdominal aortagram N/A 09/11/2011    Procedure: ABDOMINAL Maxcine Ham;  Surgeon: Wellington Hampshire, MD;  Location: [facility] CATH LAB;  Service: Cardiovascular;  Laterality: N/A;  . Lower extremity angiogram Left 12/25/2011    Procedure: LOWER EXTREMITY ANGIOGRAM;  Surgeon: Serafina Mitchell, MD;  Location: [facility] CATH LAB;  Service: Cardiovascular;   Laterality: Left;  lt leg angio co2  . Abdominal angiogram  12/25/2011    Procedure: ABDOMINAL ANGIOGRAM;  Surgeon: Serafina Mitchell, MD;  Location: [facility] CATH LAB;  Service: Cardiovascular;;  . Lower extremity angiogram N/A 07/07/2012    Procedure: LOWER EXTREMITY ANGIOGRAM;  Surgeon: Conrad Pierce, MD;  Location: [facility]ATH LAB;  Service: Cardiovascular;  Laterality: N/A;  . Lower extremity angiogram N/A 09/15/2012    Procedure: LOWER EXTREMITY ANGIOGRAM;  Surgeon: Serafina Mitchell, MD;  Location: [facility] CATH LAB;  Service: Cardiovascular;  Laterality: N/A;  . Upper extremity angiogram  09/15/2012  Procedure: UPPER EXTREMITY ANGIOGRAM;  Surgeon: Serafina Mitchell, MD;  Location: [facility] CATH LAB;  Service: Cardiovascular;;  . Biv icd genertaor change out N/A 11/09/2013    Procedure: BIV ICD GENERTAOR CHANGE OUT;  Surgeon: Coralyn Mark, MD;  Location: [facility] CATH LAB;  Service: Cardiovascular;  Laterality: N/A;    There were no vitals filed for this visit.  Visit Diagnosis:  Abnormality of gait  Impaired functional mobility and activity tolerance  Weakness generalized  Balance problems  Status post above knee amputation of left lower extremity      Subjective Assessment - 07/20/14 1105    Subjective Patient traveled to [place] this weekend, 5 hour drive with 2 stops for stretching and walking around. Drove alone with no issues. States he is going to see prosthetist tomorrow, has been having difficulty with leg slipping down. Went to [employer] and increased endurance exercise on stepping machine 7 minutes work, 3 minutes rest x3.     Currently in Pain? Yes   Pain Score 4    Pain Descriptors / Indicators Aching   Pain Type Chronic pain;Phantom pain   Pain Onset More than a month ago     Pt ambulated with rollator 114' x1, 96' x1, 40' x1, 75' x1 with stand by assist of 1. Verbal cues for foot placement under walker to increase step length on the right.  Pt negotiated ramps and curbs, outdoor concrete  surface with stand by assist of 1. Verbal cues for placement of rollator on curb to maintain balance.  Sit to stand at kitchen sink from [facility] with use of armrests, supervision. Pt c/o dizziness when looking up overhead into upper cabinets. Performed seated head  Movements side to side, up and down, and diagnonals. No complaints of dizziness but instructed pt to practice this at home and would transition to standing next visit.                              PT Education - 07/20/14 1100    Education provided Yes   Education Details Turning to sit into rollator with proper foot placement. Maintaining balance when standing from rollator and turning rollator around body   Person(s) Educated Patient   Methods Explanation;Demonstration   Comprehension Verbalized understanding;Returned demonstration;Need further instruction          PT Short Term Goals - 07/14/14 1635    PT SHORT TERM GOAL #1   Title donnes prosthesis with new suspension correctly independently. (Target Date: 06/17/14)   Status Achieved   PT SHORT TERM GOAL #2   Title demonstrates understanding of initial HEP. (Target Date: 06/17/14)   Status Achieved   PT SHORT TERM GOAL #3   Title wears prosthesis >70% of awake hours >/= 5 days /wk. (Target Date: 06/17/14)   Status Achieved   PT SHORT TERM GOAL #4   Title ambulates 100' with rolling walker & prosthesis with cues only for deviations. (Target Date: 06/17/14)   Status Achieved   PT SHORT TERM GOAL #5   Title stands 30sec without UE support with supervision. (Target Date: 06/17/14)   Baseline MET 07/14/14   Status Achieved   Additional Short Term Goals   Additional Short Term Goals Yes   PT SHORT TERM GOAL #6   Title pt ambulates 150' with rollater with supervision. (Target Date 08/11/14)   Time 4   Period Weeks   Status New   PT SHORT TERM GOAL #  7   Title pt negotiates ramps and curb with rollator with supervision. (Target Date 08/11/14)   Time 4   Period  Weeks   Status New   PT SHORT TERM GOAL #8   Title pt able to transfer safely from [facility] to chair with armrests with supervision. (Target Date 08/11/14)   Time 4   Period Weeks   Status New           PT Long Term Goals - 07/14/14 1648    PT LONG TERM GOAL #1   Title wears prosthesis >80% of awake hours daily. (Target Date: 07/15/14)   Baseline MET 07/14/14   Time 8   Period Weeks   Status Achieved   PT LONG TERM GOAL #2   Title verbalize understanding of ongoing fitness plan. (NEW Target Date: 09/09/14)   Baseline MET 07/14/14 PT is continuing to update   Time 8   Period Weeks   Status On-going   PT LONG TERM GOAL #3   Title ambulates 200' with rolling walker & prosthesis modified independent. (NEW Target Date: 09/09/14)   Baseline 07/14/14 progressing. pt able to ambulate for 100' with rollator with supervision   Time 8   Period Weeks   Status On-going   PT LONG TERM GOAL #4   Title Berg Balance >18/56 (Target Date: 07/15/14)   Baseline MET 07/14/14. BERG 20/56   Time 8   Period Weeks   Status Achieved   PT LONG TERM GOAL #5   Title negotiate ramp, curb & stairs (1 rail) with rolling walker & prosthesis modified independent. (New Target Date: 09/09/14)   Baseline 07/14/14 progressing. pt able to negotiate ramp and curb with rollator with minA and constant verbal cues   Time 8   Period Weeks   Status On-going   PT LONG TERM GOAL #6   Title BERG balance > /= 24/56   Time 8   Period Weeks   Status New               Plan - 07/20/14 1100    Clinical Impression Statement Pt continues to increase gait distance and endurance. C/O of dizziness when looking overhead and reaching into upper cabinets.    Pt will benefit from skilled therapeutic intervention in order to improve on the following deficits Abnormal gait;Cardiopulmonary status limiting activity;Decreased activity tolerance;Decreased balance;Decreased endurance;Decreased knowledge of use of DME;Decreased mobility;Decreased  range of motion;Decreased strength;Pain;Other (comment)   Rehab Potential Good   PT Frequency 2x / week   PT Duration 8 weeks   PT Treatment/Interventions ADLs/Self Care Home Management;DME Instruction;Gait training;Stair training;Functional mobility training;Therapeutic activities;Therapeutic exercise;Balance training;Neuromuscular re-education;Patient/family education;Other (comment)   PT Next Visit Plan Will work on standing blanace activites and head motions.    Consulted and Agree with Plan of Care Patient        Problem List Patient Active Problem List   Diagnosis Date Noted  . Diabetic polyneuropathy associated with diabetes mellitus due to underlying condition 07/12/2014  . CVA (cerebral infarction) 06/15/2014  . Expressive aphasia 06/15/2014  . CKD (chronic kidney disease) stage 4, GFR 15-29 ml/min 06/15/2014  . DM (diabetes mellitus), type 2, uncontrolled, with renal complications 37/12/6267  . Unilateral AKA 02/09/2013  . S/P ICD (internal cardiac defibrillator) procedure 05/18/2012  . PAD (peripheral artery disease) 09/04/2011  . CAD (coronary artery disease)   . Chronic systolic congestive heart failure 07/06/2010  . Gout 09/18/2007  . Essential hypertension 01/23/2007  . Atrial fibrillation 01/23/2007  . BPH (  benign prostatic hyperplasia) 01/23/2007    Jamey Reas, PT, DPT PT Specializing in [place] 07/20/2014 1:53 PM Phone:  [phone]  Fax:  [phone] [facility] [address] [place], [place] [address]  Machai Desmith Blair Gaylen Pereira, [place] 07/20/2014, [facility] [address] [place], [place], [address] Phone: [phone]   Fax:  [phone]

## 2014-07-25 ENCOUNTER — Encounter: Payer: Self-pay | Admitting: Physical Therapy

## 2014-07-25 ENCOUNTER — Ambulatory Visit: Payer: Commercial Managed Care - HMO | Admitting: Physical Therapy

## 2014-07-25 DIAGNOSIS — R269 Unspecified abnormalities of gait and mobility: Secondary | ICD-10-CM

## 2014-07-25 DIAGNOSIS — R531 Weakness: Secondary | ICD-10-CM

## 2014-07-25 DIAGNOSIS — Z7409 Other reduced mobility: Secondary | ICD-10-CM

## 2014-07-25 DIAGNOSIS — R2689 Other abnormalities of gait and mobility: Secondary | ICD-10-CM

## 2014-07-25 NOTE — Therapy (Signed)
[place] [address] [place], [place], [address] Phone: [phone]   Fax:  [phone]  Physical Therapy Treatment  Patient Details  Name: [name] MRN: [id number] Date of Birth: 05-26-1934 Referring Provider:  Laurey Morale, MD  Encounter Date: 07/25/2014      PT End of Session - 07/25/14 0937    Visit Number 16   Number of Visits 30   Date for PT Re-Evaluation 09/09/14   PT Start Time 0933   PT Stop Time 1013   PT Time Calculation (min) 40 min   Equipment Utilized During Treatment Gait belt   Activity Tolerance Patient limited by fatigue   Behavior During Therapy [facility] for tasks assessed/performed      Past Medical History  Diagnosis Date  . Gout   . Benign prostatic hypertrophy     takes Proscar daily  . Atrial fibrillation     takes Warfarin daily  . Chronic systolic dysfunction of left ventricle     a. mixed ischemic and nonischemic CM,  EF 35%. b. s/p AICD implantation.  . ED (erectile dysfunction)   . Arthritis   . Pacemaker     medtronic  . CAD (coronary artery disease)     a. s/p mid LAD stenting with DES 2008 Dr. Daneen Schick  . ICD (implantable cardiac defibrillator) in place     medtronic, Dr. Rayann Heman  . ICD (implantable cardiac defibrillator) in place   . Sleep apnea     hx of "had surgery for"  . Automatic implantable cardioverter-defibrillator in situ   . Depression   . Numbness and tingling     Hx; 63f left foot  . PAD (peripheral artery disease)     Severe by PV angiogram 09/2011  . Renal artery stenosis     s/p stenting 2009  . PAD (peripheral artery disease)     s/p multiple LLE bypass grafts; left mid-distal SCA occlusion by 08/2012 duplex  . Hypertension     takes Carvedilol and Losartan daily  . Constipation     takes Miralax daily as needed and Colace daily   . Type II diabetes mellitus     takes Novolog 70/30  . CHF (congestive heart failure)     takes Lasix  daily  . History of MRSA infection   . Insomnia     TAKES TRAZODONE NIGHTLY    Past Surgical History  Procedure Laterality Date  . Turp vaporization    . Cardiac defibrillator placement  12/26/09    pacemaker combo  . Cervical epidural injection  2013  . Femoral-tibial bypass graft  09/25/2011    Procedure: BYPASS GRAFT FEMORAL-TIBIAL ARTERY;  Surgeon: Mal Misty, MD;  Location: [facility] OR;  Service: Vascular;  Laterality: Left;  Left Femoral - Anterior Tibial Bypass;  saphenous vein graft from left leg  . Intraoperative arteriogram  09/25/2011    Procedure: INTRA OPERATIVE ARTERIOGRAM;  Surgeon: Mal Misty, MD;  Location: [place];  Service: Vascular;  Laterality: Left;  . Femoral-tibial bypass graft  02/07/2012    Procedure: BYPASS GRAFT FEMORAL-TIBIAL ARTERY;  Surgeon: Rosetta Posner, MD;  Location: [place];  Service: Vascular;  Laterality: Left;  Thrombectomy Left Femoral - Tibial Bypass Graft  . Embolectomy  02/07/2012    Procedure: EMBOLECTOMY;  Surgeon: Rosetta Posner, MD;  Location: [place];  Service: Vascular;  Laterality: Left;  . Femoral-tibial bypass graft  04/03/2012    Procedure: BYPASS GRAFT FEMORAL-TIBIAL ARTERY;  Surgeon: Mal Misty, MD;  Location: [place];  Service: Vascular;  Laterality: Left;  Redo  . Insert / replace / remove pacemaker  2007  . Coronary angioplasty with stent placement  ~ 2000  . Coronary angioplasty    . Uvulopalatopharyngoplasty (uppp)/tonsillectomy/septoplasty  06/30/2003    Archie Endo 06/30/2003 (07/10/2012)  . Shoulder open rotator cuff repair Right 2001    repair of lacerated right/notes 10/11/1999  (07/10/2012)  . Foot surgery Right 03/20/2001    "have plates and screws in; didn't break it" (07/10/2012)  . Carpal tunnel release Right 2002    Archie Endo 03/20/2001 (07/10/2012)  . Biceps tendon repair Right 2001    Archie Endo 03/20/2001 (07/10/2012)  . Renal artery stent  2009  . Femoral-popliteal bypass graft Left 09/16/2012    Procedure: LEFT FEMORAL-POPLITEAL BYPASS GRAFT  WITH GORTEX Propaten Graft 6x80 Thin Wall and Left lower leg Angiogram;  Surgeon: Mal Misty, MD;  Location: [place];  Service: Vascular;  Laterality: Left;  . Colonoscopy      Hx; of  . Tonsillectomy    . Adenoidectomy      Hx; of   . Femoral-tibial bypass graft Left 09/30/2012    Procedure: REDO LEFT FEMORAL-ANTERIOR TIBIAL ARTERY BYPASS USING COMPOSITE CEPHALIC AND BASILIC VEIN GRAFT FROM LEFT ARM;  Surgeon: Mal Misty, MD;  Location: [place];  Service: Vascular;  Laterality: Left;  . I&d extremity Left 10/21/2012    Procedure: EXPLORATION AND DEBRIDEMENT OF LEFT GROIN WOUND;  Surgeon: Mal Misty, MD;  Location: [place];  Service: Vascular;  Laterality: Left;  . Patch angioplasty Left 10/21/2012    Procedure: PATCH ANGIOPLASTY;  Surgeon: Mal Misty, MD;  Location: [place];  Service: Vascular;  Laterality: Left;  . Groin debridement Left 11/12/2012    Procedure: CLOSURE INGUINAL WOUND;  Surgeon: Mal Misty, MD;  Location: [place];  Service: Vascular;  Laterality: Left;  . Embolectomy Left 12/16/2012    Procedure: THROMBECTOMY  LEFT LEG BYPASS;  Surgeon: Elam Dutch, MD;  Location: [place];  Service: Vascular;  Laterality: Left;  . Leg amputation above knee Left 02/04/2013    DR LAWSON  . Amputation Left 02/04/2013    Procedure: AMPUTATION ABOVE KNEE-LEFT;  Surgeon: Mal Misty, MD;  Location: [place];  Service: Vascular;  Laterality: Left;  . Removal of graft Left 04/16/2013    Procedure: I & D LEFT AKA WOUND, POSSIBLE REMOVAL OF INFECTED GORTEX GRAFT;  Surgeon: Mal Misty, MD;  Location: [place];  Service: Vascular;  Laterality: Left;  . Abdominal aortagram N/A 09/11/2011    Procedure: ABDOMINAL Maxcine Ham;  Surgeon: Wellington Hampshire, MD;  Location: [facility] CATH LAB;  Service: Cardiovascular;  Laterality: N/A;  . Lower extremity angiogram Left 12/25/2011    Procedure: LOWER EXTREMITY ANGIOGRAM;  Surgeon: Serafina Mitchell, MD;  Location: [facility] CATH LAB;  Service: Cardiovascular;   Laterality: Left;  lt leg angio co2  . Abdominal angiogram  12/25/2011    Procedure: ABDOMINAL ANGIOGRAM;  Surgeon: Serafina Mitchell, MD;  Location: [facility] CATH LAB;  Service: Cardiovascular;;  . Lower extremity angiogram N/A 07/07/2012    Procedure: LOWER EXTREMITY ANGIOGRAM;  Surgeon: Conrad Spring Hill, MD;  Location: [facility] CATH LAB;  Service: Cardiovascular;  Laterality: N/A;  . Lower extremity angiogram N/A 09/15/2012    Procedure: LOWER EXTREMITY ANGIOGRAM;  Surgeon: Serafina Mitchell, MD;  Location: [facility] CATH LAB;  Service: Cardiovascular;  Laterality: N/A;  . Upper extremity angiogram  09/15/2012  Procedure: UPPER EXTREMITY ANGIOGRAM;  Surgeon: Serafina Mitchell, MD;  Location: [facility] CATH LAB;  Service: Cardiovascular;;  . Biv icd genertaor change out N/A 11/09/2013    Procedure: BIV ICD GENERTAOR CHANGE OUT;  Surgeon: Coralyn Mark, MD;  Location: [facility] CATH LAB;  Service: Cardiovascular;  Laterality: N/A;    There were no vitals filed for this visit.  Visit Diagnosis:  Abnormality of gait  Impaired functional mobility and activity tolerance  Balance problems  Weakness generalized      Subjective Assessment - 07/25/14 0935    Subjective No new complaints. No falls or pain to report.   Currently in Pain? No/denies   Pain Score 0-No pain           [facility] Adult PT Treatment/Exercise - 07/25/14 0937    Transfers   Sit to Stand 5: Supervision;With upper extremity assist;With armrests;From chair/3-in-1   Stand to Sit 5: Supervision;With upper extremity assist;With armrests;To chair/3-in-1   Ambulation/Gait   Ambulation/Gait Yes   Ambulation/Gait Assistance 4: Min guard   Ambulation/Gait Assistance Details cues on posture, increased step length, decreased UE reliance on walker, and on walker position with gait.                  Ambulation Distance (Feet) 120 Feet  x 2 reps   Assistive device Rollator;Prosthesis   Gait Pattern Step-to pattern;Decreased step length - right;Decreased stance time -  left;Decreased stride length;Antalgic  heavy reliance on UE's on rollator   Ambulation Surface Level;Indoor   Dynamic Standing Balance   Dynamic Standing - Balance Support During functional activity;No upper extremity supported   Dynamic Standing - Level of Assistance 4: Min assist   Dynamic Standing - Balance Activities Eyes open;Head turns;Head nods   Dynamic Standing - Comments in parallel bars: wide base of support and then with narrow base of support: eyes open head nods, shakes, and diagnoals both ways x 10 rep each.                              High Level Balance   High Level Balance Activities Side stepping   High Level Balance Comments in parallel bars x 2 laps each way. Cues to decrease UE reliance on bars and increase lower extremity weight bearing. cues to lift each foot each time, not slide the foot sideways when advancing them during side stepping.                             Prosthetics   Prosthetic Care Comments  Reports that prosthesis want to slip every once in a while. Saw prosthetist who reinforced propper donning technique.                   Current prosthetic wear tolerance (days/week)  daily   Current prosthetic wear tolerance (#hours/day)  >/= 90% of awake hours   Residual limb condition  intact per pt report   Education Provided Residual limb care;Proper weight-bearing schedule/adjustment   Person(s) Educated Patient   Education Method Explanation   Education Method Verbalized understanding;Verbal cues required   Donning Prosthesis Supervision   Doffing Prosthesis Modified independent (device/increased time)           PT Short Term Goals - 07/14/14 1635    PT SHORT TERM GOAL #1   Title donnes prosthesis with new suspension correctly independently. (Target Date: 06/17/14)   Status  Achieved   PT SHORT TERM GOAL #2   Title demonstrates understanding of initial HEP. (Target Date: 06/17/14)   Status Achieved   PT SHORT TERM GOAL #3   Title wears prosthesis >70%  of awake hours >/= 5 days /wk. (Target Date: 06/17/14)   Status Achieved   PT SHORT TERM GOAL #4   Title ambulates 100' with rolling walker & prosthesis with cues only for deviations. (Target Date: 06/17/14)   Status Achieved   PT SHORT TERM GOAL #5   Title stands 30sec without UE support with supervision. (Target Date: 06/17/14)   Baseline MET 07/14/14   Status Achieved   Additional Short Term Goals   Additional Short Term Goals Yes   PT SHORT TERM GOAL #6   Title pt ambulates 150' with rollater with supervision. (Target Date 08/11/14)   Time 4   Period Weeks   Status New   PT SHORT TERM GOAL #7   Title pt negotiates ramps and curb with rollator with supervision. (Target Date 08/11/14)   Time 4   Period Weeks   Status New   PT SHORT TERM GOAL #8   Title pt able to transfer safely from [facility] to chair with armrests with supervision. (Target Date 08/11/14)   Time 4   Period Weeks   Status New           PT Long Term Goals - 07/14/14 1648    PT LONG TERM GOAL #1   Title wears prosthesis >80% of awake hours daily. (Target Date: 07/15/14)   Baseline MET 07/14/14   Time 8   Period Weeks   Status Achieved   PT LONG TERM GOAL #2   Title verbalize understanding of ongoing fitness plan. (NEW Target Date: 09/09/14)   Baseline MET 07/14/14 PT is continuing to update   Time 8   Period Weeks   Status On-going   PT LONG TERM GOAL #3   Title ambulates 200' with rolling walker & prosthesis modified independent. (NEW Target Date: 09/09/14)   Baseline 07/14/14 progressing. pt able to ambulate for 100' with rollator with supervision   Time 8   Period Weeks   Status On-going   PT LONG TERM GOAL #4   Title Berg Balance >18/56 (Target Date: 07/15/14)   Baseline MET 07/14/14. BERG 20/56   Time 8   Period Weeks   Status Achieved   PT LONG TERM GOAL #5   Title negotiate ramp, curb & stairs (1 rail) with rolling walker & prosthesis modified independent. (New Target Date: 09/09/14)   Baseline 07/14/14  progressing. pt able to negotiate ramp and curb with rollator with minA and constant verbal cues   Time 8   Period Weeks   Status On-going   PT LONG TERM GOAL #6   Title BERG balance > /= 24/56   Time 8   Period Weeks   Status New           Plan - 07/25/14 0539    Clinical Impression Statement Increased gait distance today with rollator. No dizziness with session today. Making progress toward goals.    Pt will benefit from skilled therapeutic intervention in order to improve on the following deficits Abnormal gait;Cardiopulmonary status limiting activity;Decreased activity tolerance;Decreased balance;Decreased endurance;Decreased knowledge of use of DME;Decreased mobility;Decreased range of motion;Decreased strength;Pain;Other (comment)   Rehab Potential Good   PT Frequency 2x / week   PT Duration 8 weeks   PT Treatment/Interventions ADLs/Self Care Home Management;DME Instruction;Gait training;Stair training;Functional mobility training;Therapeutic  activities;Therapeutic exercise;Balance training;Neuromuscular re-education;Patient/family education;Other (comment)   PT Next Visit Plan continue toward LTG's   Consulted and Agree with Plan of Care Patient        Problem List Patient Active Problem List   Diagnosis Date Noted  . Diabetic polyneuropathy associated with diabetes mellitus due to underlying condition 07/12/2014  . CVA (cerebral infarction) 06/15/2014  . Expressive aphasia 06/15/2014  . CKD (chronic kidney disease) stage 4, GFR 15-29 ml/min 06/15/2014  . DM (diabetes mellitus), type 2, uncontrolled, with renal complications 22/24/1146  . Unilateral AKA 02/09/2013  . S/P ICD (internal cardiac defibrillator) procedure 05/18/2012  . PAD (peripheral artery disease) 09/04/2011  . CAD (coronary artery disease)   . Chronic systolic congestive heart failure 07/06/2010  . Gout 09/18/2007  . Essential hypertension 01/23/2007  . Atrial fibrillation 01/23/2007  . BPH (benign  prostatic hyperplasia) 01/23/2007    Willow Ora 07/25/2014, 12:23 PM  Willow Ora, PTA, [place] [address], [place], [place] [address] [phone] 07/25/2014, 12:23 PM

## 2014-07-27 ENCOUNTER — Encounter: Payer: Self-pay | Admitting: Physical Therapy

## 2014-07-27 ENCOUNTER — Telehealth: Payer: Self-pay | Admitting: Family Medicine

## 2014-07-27 ENCOUNTER — Ambulatory Visit: Payer: Commercial Managed Care - HMO | Admitting: Physical Therapy

## 2014-07-27 DIAGNOSIS — Z7409 Other reduced mobility: Secondary | ICD-10-CM

## 2014-07-27 DIAGNOSIS — Z89612 Acquired absence of left leg above knee: Secondary | ICD-10-CM

## 2014-07-27 DIAGNOSIS — R269 Unspecified abnormalities of gait and mobility: Secondary | ICD-10-CM | POA: Diagnosis not present

## 2014-07-27 DIAGNOSIS — R2689 Other abnormalities of gait and mobility: Secondary | ICD-10-CM

## 2014-07-27 DIAGNOSIS — R531 Weakness: Secondary | ICD-10-CM

## 2014-07-27 NOTE — Therapy (Signed)
[facility] [address] [place], [place], [address] Phone: [phone]   Fax:  [phone]  Physical Therapy Treatment  Patient Details  Name: [name] MRN: [id number] Date of Birth: November 22, 1934 Referring Provider:  Laurey Morale, MD  Encounter Date: 07/27/2014      PT End of Session - 07/27/14 0930    Visit Number 17   Number of Visits 30   Date for PT Re-Evaluation 09/09/14   PT Start Time 0930   PT Stop Time 1008   PT Time Calculation (min) 38 min   Equipment Utilized During Treatment Gait belt   Activity Tolerance Patient limited by fatigue   Behavior During Therapy [facility] for tasks assessed/performed      Past Medical History  Diagnosis Date  . Gout   . Benign prostatic hypertrophy     takes Proscar daily  . Atrial fibrillation     takes Warfarin daily  . Chronic systolic dysfunction of left ventricle     a. mixed ischemic and nonischemic CM,  EF 35%. b. s/p AICD implantation.  . ED (erectile dysfunction)   . Arthritis   . Pacemaker     medtronic  . CAD (coronary artery disease)     a. s/p mid LAD stenting with DES 2008 Dr. Daneen Schick  . ICD (implantable cardiac defibrillator) in place     medtronic, Dr. Rayann Heman  . ICD (implantable cardiac defibrillator) in place   . Sleep apnea     hx of "had surgery for"  . Automatic implantable cardioverter-defibrillator in situ   . Depression   . Numbness and tingling     Hx; 36f left foot  . PAD (peripheral artery disease)     Severe by PV angiogram 09/2011  . Renal artery stenosis     s/p stenting 2009  . PAD (peripheral artery disease)     s/p multiple LLE bypass grafts; left mid-distal SCA occlusion by 08/2012 duplex  . Hypertension     takes Carvedilol and Losartan daily  . Constipation     takes Miralax daily as needed and Colace daily   . Type II diabetes mellitus     takes Novolog 70/30  . CHF (congestive heart failure)     takes Lasix  daily  . History of MRSA infection   . Insomnia     TAKES TRAZODONE NIGHTLY    Past Surgical History  Procedure Laterality Date  . Turp vaporization    . Cardiac defibrillator placement  12/26/09    pacemaker combo  . Cervical epidural injection  2013  . Femoral-tibial bypass graft  09/25/2011    Procedure: BYPASS GRAFT FEMORAL-TIBIAL ARTERY;  Surgeon: Mal Misty, MD;  Location: [facility] OR;  Service: Vascular;  Laterality: Left;  Left Femoral - Anterior Tibial Bypass;  saphenous vein graft from left leg  . Intraoperative arteriogram  09/25/2011    Procedure: INTRA OPERATIVE ARTERIOGRAM;  Surgeon: Mal Misty, MD;  Location: [place];  Service: Vascular;  Laterality: Left;  . Femoral-tibial bypass graft  02/07/2012    Procedure: BYPASS GRAFT FEMORAL-TIBIAL ARTERY;  Surgeon: Rosetta Posner, MD;  Location: [place];  Service: Vascular;  Laterality: Left;  Thrombectomy Left Femoral - Tibial Bypass Graft  . Embolectomy  02/07/2012    Procedure: EMBOLECTOMY;  Surgeon: Rosetta Posner, MD;  Location: [place];  Service: Vascular;  Laterality: Left;  . Femoral-tibial bypass graft  04/03/2012    Procedure: BYPASS GRAFT FEMORAL-TIBIAL ARTERY;  Surgeon: Mal Misty, MD;  Location: [place];  Service: Vascular;  Laterality: Left;  Redo  . Insert / replace / remove pacemaker  2007  . Coronary angioplasty with stent placement  ~ 2000  . Coronary angioplasty    . Uvulopalatopharyngoplasty (uppp)/tonsillectomy/septoplasty  06/30/2003    Archie Endo 06/30/2003 (07/10/2012)  . Shoulder open rotator cuff repair Right 2001    repair of lacerated right/notes 10/11/1999  (07/10/2012)  . Foot surgery Right 03/20/2001    "have plates and screws in; didn't break it" (07/10/2012)  . Carpal tunnel release Right 2002    Archie Endo 03/20/2001 (07/10/2012)  . Biceps tendon repair Right 2001    Archie Endo 03/20/2001 (07/10/2012)  . Renal artery stent  2009  . Femoral-popliteal bypass graft Left 09/16/2012    Procedure: LEFT FEMORAL-POPLITEAL BYPASS GRAFT  WITH GORTEX Propaten Graft 6x80 Thin Wall and Left lower leg Angiogram;  Surgeon: Mal Misty, MD;  Location: [place];  Service: Vascular;  Laterality: Left;  . Colonoscopy      Hx; of  . Tonsillectomy    . Adenoidectomy      Hx; of   . Femoral-tibial bypass graft Left 09/30/2012    Procedure: REDO LEFT FEMORAL-ANTERIOR TIBIAL ARTERY BYPASS USING COMPOSITE CEPHALIC AND BASILIC VEIN GRAFT FROM LEFT ARM;  Surgeon: Mal Misty, MD;  Location: [place];  Service: Vascular;  Laterality: Left;  . I&d extremity Left 10/21/2012    Procedure: EXPLORATION AND DEBRIDEMENT OF LEFT GROIN WOUND;  Surgeon: Mal Misty, MD;  Location: [place];  Service: Vascular;  Laterality: Left;  . Patch angioplasty Left 10/21/2012    Procedure: PATCH ANGIOPLASTY;  Surgeon: Mal Misty, MD;  Location: [place];  Service: Vascular;  Laterality: Left;  . Groin debridement Left 11/12/2012    Procedure: CLOSURE INGUINAL WOUND;  Surgeon: Mal Misty, MD;  Location: [place];  Service: Vascular;  Laterality: Left;  . Embolectomy Left 12/16/2012    Procedure: THROMBECTOMY  LEFT LEG BYPASS;  Surgeon: Elam Dutch, MD;  Location: [place];  Service: Vascular;  Laterality: Left;  . Leg amputation above knee Left 02/04/2013    DR LAWSON  . Amputation Left 02/04/2013    Procedure: AMPUTATION ABOVE KNEE-LEFT;  Surgeon: Mal Misty, MD;  Location: [place];  Service: Vascular;  Laterality: Left;  . Removal of graft Left 04/16/2013    Procedure: I & D LEFT AKA WOUND, POSSIBLE REMOVAL OF INFECTED GORTEX GRAFT;  Surgeon: Mal Misty, MD;  Location: [place];  Service: Vascular;  Laterality: Left;  . Abdominal aortagram N/A 09/11/2011    Procedure: ABDOMINAL Maxcine Ham;  Surgeon: Wellington Hampshire, MD;  Location: [facility] CATH LAB;  Service: Cardiovascular;  Laterality: N/A;  . Lower extremity angiogram Left 12/25/2011    Procedure: LOWER EXTREMITY ANGIOGRAM;  Surgeon: Serafina Mitchell, MD;  Location: [facility] CATH LAB;  Service: Cardiovascular;   Laterality: Left;  lt leg angio co2  . Abdominal angiogram  12/25/2011    Procedure: ABDOMINAL ANGIOGRAM;  Surgeon: Serafina Mitchell, MD;  Location: [facility] CATH LAB;  Service: Cardiovascular;;  . Lower extremity angiogram N/A 07/07/2012    Procedure: LOWER EXTREMITY ANGIOGRAM;  Surgeon: Conrad Hardy, MD;  Location: [facility] CATH LAB;  Service: Cardiovascular;  Laterality: N/A;  . Lower extremity angiogram N/A 09/15/2012    Procedure: LOWER EXTREMITY ANGIOGRAM;  Surgeon: Serafina Mitchell, MD;  Location: [facility] CATH LAB;  Service: Cardiovascular;  Laterality: N/A;  . Upper extremity angiogram  09/15/2012  Procedure: UPPER EXTREMITY ANGIOGRAM;  Surgeon: Serafina Mitchell, MD;  Location: [facility] CATH LAB;  Service: Cardiovascular;;  . Biv icd genertaor change out N/A 11/09/2013    Procedure: BIV ICD GENERTAOR CHANGE OUT;  Surgeon: Coralyn Mark, MD;  Location: [facility] CATH LAB;  Service: Cardiovascular;  Laterality: N/A;    There were no vitals filed for this visit.  Visit Diagnosis:  Abnormality of gait  Impaired functional mobility and activity tolerance  Balance problems  Weakness generalized  Status post above knee amputation of left lower extremity      Subjective Assessment - 07/27/14 0930    Subjective Reports tired today. He is performing [employer] recumbent stepper at 7 min work 3 min rest 3 sets. He has seen improvement in overall endurance.   Currently in Pain? No/denies                         [facility] Adult PT Treatment/Exercise - 07/27/14 0930    Transfers   Sit to Stand 5: Supervision;With upper extremity assist;With armrests;From chair/3-in-1  from locked rollator   Sit to Stand Details (indicate cue type and reason) with cues on rollator control   Stand to Sit 5: Supervision;With upper extremity assist;With armrests;To chair/3-in-1  to locked rollator   Stand to Sit Details cues on rollator control   Ambulation/Gait   Ambulation/Gait Yes   Ambulation/Gait Assistance 5: Supervision    Ambulation/Gait Assistance Details cues on posture, position in rollator, step length   Ambulation Distance (Feet) 140 Feet  x 2 reps   Assistive device Rollator;Prosthesis   Gait Pattern Decreased step length - right;Decreased stance time - left;Decreased stride length;Antalgic;Step-through pattern;Trunk flexed  heavy reliance on UE's on rollator   Ambulation Surface Indoor;Level   Ramp 5: Supervision  Rollator walker & prosthesis, 3 reps   Ramp Details (indicate cue type and reason) verbal cues on locks, rollator position, posture   Curb 5: Supervision  rollator walker & prosthesis, 3 reps   Curb Details (indicate cue type and reason) verbal cues on rollator management   Dynamic Standing Balance   Dynamic Standing - Balance Support During functional activity;No upper extremity supported   Dynamic Standing - Level of Assistance 5: Stand by assistance   Dynamic Standing - Balance Activities Eyes open;Head turns;Head nods;Reaching for objects   Dynamic Standing - Comments with rollator, able to move rollator from posterior to anterior & ant. to post.   High Level Balance   High Level Balance Activities --   High Level Balance Comments --   Prosthetics   Prosthetic Care Comments  Reports that prosthesis want to slip every once in a while. Saw prosthetist who reinforced propper donning technique.                   Current prosthetic wear tolerance (days/week)  daily   Current prosthetic wear tolerance (#hours/day)  >/= 90% of awake hours   Residual limb condition  intact per pt report                PT Education - 07/27/14 0930    Education provided Yes   Education Details standing & seated heelcord stretch,activity level progression to improve function   Person(s) Educated Patient   Methods Explanation;Demonstration   Comprehension Verbalized understanding;Returned demonstration          PT Short Term Goals - 07/14/14 1635    PT SHORT TERM GOAL #1   Title donnes  prosthesis  with new suspension correctly independently. (Target Date: 06/17/14)   Status Achieved   PT SHORT TERM GOAL #2   Title demonstrates understanding of initial HEP. (Target Date: 06/17/14)   Status Achieved   PT SHORT TERM GOAL #3   Title wears prosthesis >70% of awake hours >/= 5 days /wk. (Target Date: 06/17/14)   Status Achieved   PT SHORT TERM GOAL #4   Title ambulates 100' with rolling walker & prosthesis with cues only for deviations. (Target Date: 06/17/14)   Status Achieved   PT SHORT TERM GOAL #5   Title stands 30sec without UE support with supervision. (Target Date: 06/17/14)   Baseline MET 07/14/14   Status Achieved   Additional Short Term Goals   Additional Short Term Goals Yes   PT SHORT TERM GOAL #6   Title pt ambulates 150' with rollater with supervision. (Target Date 08/11/14)   Time 4   Period Weeks   Status New   PT SHORT TERM GOAL #7   Title pt negotiates ramps and curb with rollator with supervision. (Target Date 08/11/14)   Time 4   Period Weeks   Status New   PT SHORT TERM GOAL #8   Title pt able to transfer safely from [facility] to chair with armrests with supervision. (Target Date 08/11/14)   Time 4   Period Weeks   Status New           PT Long Term Goals - 07/14/14 1648    PT LONG TERM GOAL #1   Title wears prosthesis >80% of awake hours daily. (Target Date: 07/15/14)   Baseline MET 07/14/14   Time 8   Period Weeks   Status Achieved   PT LONG TERM GOAL #2   Title verbalize understanding of ongoing fitness plan. (NEW Target Date: 09/09/14)   Baseline MET 07/14/14 PT is continuing to update   Time 8   Period Weeks   Status On-going   PT LONG TERM GOAL #3   Title ambulates 200' with rolling walker & prosthesis modified independent. (NEW Target Date: 09/09/14)   Baseline 07/14/14 progressing. pt able to ambulate for 100' with rollator with supervision   Time 8   Period Weeks   Status On-going   PT LONG TERM GOAL #4   Title Berg Balance >18/56 (Target Date:  07/15/14)   Baseline MET 07/14/14. BERG 20/56   Time 8   Period Weeks   Status Achieved   PT LONG TERM GOAL #5   Title negotiate ramp, curb & stairs (1 rail) with rolling walker & prosthesis modified independent. (New Target Date: 09/09/14)   Baseline 07/14/14 progressing. pt able to negotiate ramp and curb with rollator with minA and constant verbal cues   Time 8   Period Weeks   Status On-going   PT LONG TERM GOAL #6   Title BERG balance > /= 24/56   Time 8   Period Weeks   Status New               Plan - 07/27/14 0930    Clinical Impression Statement Patient had improved fluency & gait speed it appears from a few weeks ago. Patient is concerned with his driveway due to incline.   Pt will benefit from skilled therapeutic intervention in order to improve on the following deficits Abnormal gait;Cardiopulmonary status limiting activity;Decreased activity tolerance;Decreased balance;Decreased endurance;Decreased knowledge of use of DME;Decreased mobility;Decreased range of motion;Decreased strength;Pain;Other (comment)   Rehab Potential Good   PT Frequency 2x /  week   PT Duration 8 weeks   PT Treatment/Interventions ADLs/Self Care Home Management;DME Instruction;Gait training;Stair training;Functional mobility training;Therapeutic activities;Therapeutic exercise;Balance training;Neuromuscular re-education;Patient/family education;Other (comment)   PT Next Visit Plan continue toward LTG's   Consulted and Agree with Plan of Care Patient        Problem List Patient Active Problem List   Diagnosis Date Noted  . Diabetic polyneuropathy associated with diabetes mellitus due to underlying condition 07/12/2014  . CVA (cerebral infarction) 06/15/2014  . Expressive aphasia 06/15/2014  . CKD (chronic kidney disease) stage 4, GFR 15-29 ml/min 06/15/2014  . DM (diabetes mellitus), type 2, uncontrolled, with renal complications 75/12/2583  . Unilateral AKA 02/09/2013  . S/P ICD (internal  cardiac defibrillator) procedure 05/18/2012  . PAD (peripheral artery disease) 09/04/2011  . CAD (coronary artery disease)   . Chronic systolic congestive heart failure 07/06/2010  . Gout 09/18/2007  . Essential hypertension 01/23/2007  . Atrial fibrillation 01/23/2007  . BPH (benign prostatic hyperplasia) 01/23/2007    Jamey Reas PT, DPT 07/27/2014, 8:56 PM  [facility] [address] [place], [place], [address] Phone: [phone]   Fax:  [phone]

## 2014-07-27 NOTE — Telephone Encounter (Signed)
Patient called stating the tremors in his hands are back.  He declined an office visit and stated he wanted Dr. Sarajane Jews just to advise him on what to do.  Please call patient back.

## 2014-07-28 NOTE — Telephone Encounter (Signed)
The first thing I usually try for tremors is a beta blocker medication, but he is already on one (Carvedilol). The next step I would take is for him to see a Neurologist. I can do a referral if he wants me to.

## 2014-07-28 NOTE — Telephone Encounter (Signed)
I left a voice message for pt with below information.  

## 2014-08-03 ENCOUNTER — Encounter: Payer: Self-pay | Admitting: Physical Medicine & Rehabilitation

## 2014-08-03 ENCOUNTER — Encounter
Payer: Commercial Managed Care - HMO | Attending: Physical Medicine & Rehabilitation | Admitting: Physical Medicine & Rehabilitation

## 2014-08-03 VITALS — HR 68 | Resp 14

## 2014-08-03 DIAGNOSIS — M79672 Pain in left foot: Secondary | ICD-10-CM

## 2014-08-03 DIAGNOSIS — G546 Phantom limb syndrome with pain: Secondary | ICD-10-CM | POA: Diagnosis not present

## 2014-08-03 DIAGNOSIS — M79605 Pain in left leg: Secondary | ICD-10-CM

## 2014-08-03 DIAGNOSIS — S78112A Complete traumatic amputation at level between left hip and knee, initial encounter: Secondary | ICD-10-CM

## 2014-08-03 DIAGNOSIS — Z89612 Acquired absence of left leg above knee: Secondary | ICD-10-CM | POA: Diagnosis not present

## 2014-08-03 MED ORDER — GABAPENTIN 100 MG PO CAPS: 200.0000 mg | ORAL_CAPSULE | Freq: Three times a day (TID) | ORAL | Status: DC

## 2014-08-03 MED ORDER — OXYCODONE-ACETAMINOPHEN 7.5-325 MG PO TABS: 1.0000 | ORAL_TABLET | Freq: Four times a day (QID) | ORAL | Status: DC | PRN

## 2014-08-03 NOTE — Progress Notes (Signed)
Subjective:    Patient ID: [name], male    DOB: 1935/02/07, 80 y.o.   MRN: [id number]  HPI   Mr. Vanschaick is here in follow up of his left AKA and associated functional deficits and pain. He stopped taking hte topamax as it was ineffective. He didn't call back to our office for any adjustments. He was place on gabapentin (which we previously stopped) by his PCP due to tremors---he feels that the gabapentin at the 100mg  tid dose has helped. He is having a lot of phantom pain still especially in his left toes.   His leg has been losing suction on a daily basis. A new suspension device (suction suspension) was placed by [employer]. luckily he hasn't had a fall as a result.   The percocet seems to help control his pain. It helps him sleep also.   He was hospitalized in April for aphasia and an apparent "TIA"--i reviewed the chart and no stroke was found.  I reviewed SLP's note and they stated he didn't require therapy. He still feels that he has occasional difficulty finding words.   Pain Inventory Average Pain 5 Pain Right Now 2 My pain is intermittent, sharp and stabbing  In the last 24 hours, has pain interfered with the following? General activity 5 Relation with others 5 Enjoyment of life 5 What TIME of day is your pain at its worst? daytime Sleep (in general) Poor  Pain is worse with: inactivity and standing Pain improves with: rest and medication Relief from Meds: 10  Mobility walk without assistance how many minutes can you walk? 5 ability to climb steps?  yes do you drive?  no  Function retired  Neuro/Psych bladder control problems tremor  Prior Studies Any changes since last visit?  no  Physicians involved in your care Any changes since last visit?  no   Family History  Problem Relation Age of Onset  . Cancer      breast/fhx  . Heart disease      fhx  . Diabetes Neg Hx   . Cancer Father    History   Social History  . Marital Status: Widowed      Spouse Name: N/A  . Number of Children: N/A  . Years of Education: N/A   Occupational History  . Retired    Social History Main Topics  . Smoking status: Current Some Day Smoker    Types: Cigars  . Smokeless tobacco: Never Used     Comment: 1 cigar per week  . Alcohol Use: 0.0 oz/week    0 Standard drinks or equivalent per week     Comment: less than once a week  . Drug Use: No  . Sexual Activity: Not Currently   Other Topics Concern  . None   Social History Narrative   Lives in [place] with significant other,  [name].   Past Surgical History  Procedure Laterality Date  . Turp vaporization    . Cardiac defibrillator placement  12/26/09    pacemaker combo  . Cervical epidural injection  2013  . Femoral-tibial bypass graft  09/25/2011    Procedure: BYPASS GRAFT FEMORAL-TIBIAL ARTERY;  Surgeon: Mal Misty, MD;  Location: [facility] OR;  Service: Vascular;  Laterality: Left;  Left Femoral - Anterior Tibial Bypass;  saphenous vein graft from left leg  . Intraoperative arteriogram  09/25/2011    Procedure: INTRA OPERATIVE ARTERIOGRAM;  Surgeon: Mal Misty, MD;  Location: [place];  Service: Vascular;  Laterality: Left;  . Femoral-tibial bypass graft  02/07/2012    Procedure: BYPASS GRAFT FEMORAL-TIBIAL ARTERY;  Surgeon: Rosetta Posner, MD;  Location: [place];  Service: Vascular;  Laterality: Left;  Thrombectomy Left Femoral - Tibial Bypass Graft  . Embolectomy  02/07/2012    Procedure: EMBOLECTOMY;  Surgeon: Rosetta Posner, MD;  Location: [place];  Service: Vascular;  Laterality: Left;  . Femoral-tibial bypass graft  04/03/2012    Procedure: BYPASS GRAFT FEMORAL-TIBIAL ARTERY;  Surgeon: Mal Misty, MD;  Location: [place];  Service: Vascular;  Laterality: Left;  Redo  . Insert / replace / remove pacemaker  2007  . Coronary angioplasty with stent placement  ~ 2000  . Coronary angioplasty    . Uvulopalatopharyngoplasty (uppp)/tonsillectomy/septoplasty  06/30/2003    Archie Endo 06/30/2003  (07/10/2012)  . Shoulder open rotator cuff repair Right 2001    repair of lacerated right/notes 10/11/1999  (07/10/2012)  . Foot surgery Right 03/20/2001    "have plates and screws in; didn't break it" (07/10/2012)  . Carpal tunnel release Right 2002    Archie Endo 03/20/2001 (07/10/2012)  . Biceps tendon repair Right 2001    Archie Endo 03/20/2001 (07/10/2012)  . Renal artery stent  2009  . Femoral-popliteal bypass graft Left 09/16/2012    Procedure: LEFT FEMORAL-POPLITEAL BYPASS GRAFT WITH GORTEX Propaten Graft 6x80 Thin Wall and Left lower leg Angiogram;  Surgeon: Mal Misty, MD;  Location: [place];  Service: Vascular;  Laterality: Left;  . Colonoscopy      Hx; of  . Tonsillectomy    . Adenoidectomy      Hx; of   . Femoral-tibial bypass graft Left 09/30/2012    Procedure: REDO LEFT FEMORAL-ANTERIOR TIBIAL ARTERY BYPASS USING COMPOSITE CEPHALIC AND BASILIC VEIN GRAFT FROM LEFT ARM;  Surgeon: Mal Misty, MD;  Location: [place];  Service: Vascular;  Laterality: Left;  . I&d extremity Left 10/21/2012    Procedure: EXPLORATION AND DEBRIDEMENT OF LEFT GROIN WOUND;  Surgeon: Mal Misty, MD;  Location: [place];  Service: Vascular;  Laterality: Left;  . Patch angioplasty Left 10/21/2012    Procedure: PATCH ANGIOPLASTY;  Surgeon: Mal Misty, MD;  Location: [place];  Service: Vascular;  Laterality: Left;  . Groin debridement Left 11/12/2012    Procedure: CLOSURE INGUINAL WOUND;  Surgeon: Mal Misty, MD;  Location: [place];  Service: Vascular;  Laterality: Left;  . Embolectomy Left 12/16/2012    Procedure: THROMBECTOMY  LEFT LEG BYPASS;  Surgeon: Elam Dutch, MD;  Location: [place];  Service: Vascular;  Laterality: Left;  . Leg amputation above knee Left 02/04/2013    DR LAWSON  . Amputation Left 02/04/2013    Procedure: AMPUTATION ABOVE KNEE-LEFT;  Surgeon: Mal Misty, MD;  Location: [place];  Service: Vascular;  Laterality: Left;  . Removal of graft Left 04/16/2013    Procedure: I & D LEFT AKA WOUND,  POSSIBLE REMOVAL OF INFECTED GORTEX GRAFT;  Surgeon: Mal Misty, MD;  Location: [place];  Service: Vascular;  Laterality: Left;  . Abdominal aortagram N/A 09/11/2011    Procedure: ABDOMINAL Maxcine Ham;  Surgeon: Wellington Hampshire, MD;  Location: [facility] CATH LAB;  Service: Cardiovascular;  Laterality: N/A;  . Lower extremity angiogram Left 12/25/2011    Procedure: LOWER EXTREMITY ANGIOGRAM;  Surgeon: Serafina Mitchell, MD;  Location: [facility] CATH LAB;  Service: Cardiovascular;  Laterality: Left;  lt leg angio co2  . Abdominal angiogram  12/25/2011    Procedure: ABDOMINAL ANGIOGRAM;  Surgeon: Serafina Mitchell, MD;  Location: [facility] CATH LAB;  Service: Cardiovascular;;  . Lower extremity angiogram N/A 07/07/2012    Procedure: LOWER EXTREMITY ANGIOGRAM;  Surgeon: Conrad Lantana, MD;  Location: [facility] CATH LAB;  Service: Cardiovascular;  Laterality: N/A;  . Lower extremity angiogram N/A 09/15/2012    Procedure: LOWER EXTREMITY ANGIOGRAM;  Surgeon: Serafina Mitchell, MD;  Location: [facility] CATH LAB;  Service: Cardiovascular;  Laterality: N/A;  . Upper extremity angiogram  09/15/2012    Procedure: UPPER EXTREMITY ANGIOGRAM;  Surgeon: Serafina Mitchell, MD;  Location: [facility] CATH LAB;  Service: Cardiovascular;;  . Biv icd genertaor change out N/A 11/09/2013    Procedure: BIV ICD GENERTAOR CHANGE OUT;  Surgeon: Coralyn Mark, MD;  Location: [facility] CATH LAB;  Service: Cardiovascular;  Laterality: N/A;   Past Medical History  Diagnosis Date  . Gout   . Benign prostatic hypertrophy     takes Proscar daily  . Atrial fibrillation     takes Warfarin daily  . Chronic systolic dysfunction of left ventricle     a. mixed ischemic and nonischemic CM,  EF 35%. b. s/p AICD implantation.  . ED (erectile dysfunction)   . Arthritis   . Pacemaker     medtronic  . CAD (coronary artery disease)     a. s/p mid LAD stenting with DES 2008 Dr. Daneen Schick  . ICD (implantable cardiac defibrillator) in place     medtronic, Dr. Rayann Heman  . ICD (implantable cardiac  defibrillator) in place   . Sleep apnea     hx of "had surgery for"  . Automatic implantable cardioverter-defibrillator in situ   . Depression   . Numbness and tingling     Hx; 73f left foot  . PAD (peripheral artery disease)     Severe by PV angiogram 09/2011  . Renal artery stenosis     s/p stenting 2009  . PAD (peripheral artery disease)     s/p multiple LLE bypass grafts; left mid-distal SCA occlusion by 08/2012 duplex  . Hypertension     takes Carvedilol and Losartan daily  . Constipation     takes Miralax daily as needed and Colace daily   . Type II diabetes mellitus     takes Novolog 70/30  . CHF (congestive heart failure)     takes Lasix daily  . History of MRSA infection   . Insomnia     TAKES TRAZODONE NIGHTLY   Pulse 68  Resp 14  SpO2 99%  Opioid Risk Score:   Fall Risk Score: Moderate Fall Risk (6-13 points)`1  Depression screen PHQ 2/9  Depression screen PHQ 2/9 06/03/2014  Decreased Interest 2  Down, Depressed, Hopeless 1  PHQ - 2 Score 3  Altered sleeping 3  Tired, decreased energy 2  Change in appetite 0  Feeling bad or failure about yourself  0  Trouble concentrating 1  Moving slowly or fidgety/restless 0  Suicidal thoughts 0  PHQ-9 Score 9     Review of Systems  Constitutional: Negative.   HENT: Negative.   Eyes: Negative.   Respiratory: Negative.   Cardiovascular: Negative.   Gastrointestinal: Negative.   Endocrine: Negative.   Genitourinary:       Bladder control problems  Musculoskeletal:       Tremors  Skin: Negative.   Allergic/Immunologic: Negative.   Neurological: Positive for tremors.  Hematological: Negative.   Psychiatric/Behavioral: Negative.        Objective:   Physical Exam  Constitutional: He is oriented to person, place, and time. He appears well-developed and well-nourished.  HENT:  Head: Normocephalic and atraumatic.  Eyes: Conjunctivae are normal. Pupils are equal, round, and reactive to light.  Neck: Neck  supple.  Cardiovascular: Normal rate and regular rhythm. No rubs, gallops. Pacer site dressed  No murmur heard.  Respiratory: Effort normal and breath sounds normal. No respiratory distress. He has no wheezes.  GI: Soft. Bowel sounds are normal. He exhibits no distension. There is no tenderness.  Musculoskeletal:  L-AKA prosthesis still loose. Rotation noted. Limb smaller. Mild warmth and erythema over right middle MCP and DIP. Tender to palpation also.  Neurological: He is alert and oriented to person, place, and time.  Follows commands. Strength grossly intact in UE's at 5/5. RLE is 4/5 prox to 5/5 distally. Left HF 4/5. mild sensory loss in distal right leg/foot. Cognitivley-linguistically he's at baseline.  Skin: Intact.  Psychiatric: He has a normal mood and affect. His speech is normal and behavior is normal. Judgment and thought content normal. Cognition and memory are normal.     Assessment/Plan:  1. Functional deficits secondary to left AKA.  2. Phantom limb pain---has been on gabapentin before---restarted by pcp -increase gabapentin to 200mg  tid ultimately to see if it better controls phantom sx---won't increase to prior level---further titration potentially at next visit -continue 7.5mg  percocet for breakthrough pain -follow up with [employer] regarding prosthetic fit---stump is still shrinking  3. Follow up with me in about 2 months . 15 minutes of face to face patient care time were spent during this visit. All questions were encouraged and answered.  4. Gout---allopurinol    Follow up with me or NP in about 1 month

## 2014-08-03 NOTE — Patient Instructions (Signed)
GABAPENTIN:  DAYS 1-5: 100MG  - 100MG  - 200MG  DAYS 6-10: 200-100-200 DAYS 11+: 200-200-200

## 2014-08-04 ENCOUNTER — Ambulatory Visit (INDEPENDENT_AMBULATORY_CARE_PROVIDER_SITE_OTHER): Payer: Commercial Managed Care - HMO | Admitting: *Deleted

## 2014-08-04 ENCOUNTER — Other Ambulatory Visit: Payer: Self-pay | Admitting: Physical Medicine & Rehabilitation

## 2014-08-04 DIAGNOSIS — I5022 Chronic systolic (congestive) heart failure: Secondary | ICD-10-CM | POA: Diagnosis not present

## 2014-08-04 DIAGNOSIS — Z95810 Presence of automatic (implantable) cardiac defibrillator: Secondary | ICD-10-CM

## 2014-08-08 ENCOUNTER — Ambulatory Visit: Payer: Commercial Managed Care - HMO | Attending: Family Medicine | Admitting: Physical Therapy

## 2014-08-08 ENCOUNTER — Encounter: Payer: Self-pay | Admitting: *Deleted

## 2014-08-08 ENCOUNTER — Encounter: Payer: Self-pay | Admitting: Physical Therapy

## 2014-08-08 DIAGNOSIS — Z7409 Other reduced mobility: Secondary | ICD-10-CM

## 2014-08-08 DIAGNOSIS — R6889 Other general symptoms and signs: Secondary | ICD-10-CM | POA: Diagnosis present

## 2014-08-08 DIAGNOSIS — R2689 Other abnormalities of gait and mobility: Secondary | ICD-10-CM

## 2014-08-08 DIAGNOSIS — R269 Unspecified abnormalities of gait and mobility: Secondary | ICD-10-CM | POA: Diagnosis not present

## 2014-08-08 DIAGNOSIS — R29818 Other symptoms and signs involving the nervous system: Secondary | ICD-10-CM | POA: Insufficient documentation

## 2014-08-08 DIAGNOSIS — R531 Weakness: Secondary | ICD-10-CM

## 2014-08-08 DIAGNOSIS — Z89612 Acquired absence of left leg above knee: Secondary | ICD-10-CM | POA: Diagnosis present

## 2014-08-08 NOTE — Progress Notes (Signed)
EPIC Encounter for ICM Monitoring  Patient Name: [name] is a 80 y.o. male Date: 08/08/2014 Primary Care Physican: Laurey Morale, MD Primary Cardiologist: Tamala Julian  Electrophysiologist: Allred Dry Weight: 184 lbs (with prosthesis)       In the past month, have you:  1. Gained more than 2 pounds in a day or more than 5 pounds in a week? no  2. Had changes in your medications (with verification of current medications)? Yes. Dr. Naaman Plummer recently increased gabapentin to 100 mg two capsules TID. The patient is tolerating the change.  3. Had more shortness of breath than is usual for you? no  4. Limited your activity because of shortness of breath? no  5. Not been able to sleep because of shortness of breath? no  6. Had increased swelling in your feet or ankles? no  7. Had symptoms of dehydration (dizziness, dry mouth, increased thirst, decreased urine output) no  8. Had changes in sodium restriction? no  9. Been compliant with medication? Yes   ICM trend:   Follow-up plan: ICM clinic phone appointment: 09/08/14. No changes made today.  Copy of note sent to patient's primary care physician, primary cardiologist, and device following physician.  Alvis Lemmings, RN, BSN 08/08/2014 10:31 AM

## 2014-08-08 NOTE — Therapy (Signed)
[facility] [address] [place], [place], [address] Phone: [phone]   Fax:  [phone]  Physical Therapy Treatment  Patient Details  Name: [name] MRN: [id number] Date of Birth: 07-31-1934 Referring Provider:  Laurey Morale, MD  Encounter Date: 08/08/2014    08/08/14 0936  PT Visits / Re-Eval  Visit Number 18  Number of Visits 30  Date for PT Re-Evaluation 09/09/14  PT Time Calculation  PT Start Time 0931  PT Stop Time 1013  PT Time Calculation (min) 42 min  PT - End of Session  Equipment Utilized During Treatment Gait belt  Activity Tolerance Patient limited by fatigue  Behavior During Therapy [facility] for tasks assessed/performed     Past Medical History  Diagnosis Date  . Gout   . Benign prostatic hypertrophy     takes Proscar daily  . Atrial fibrillation     takes Warfarin daily  . Chronic systolic dysfunction of left ventricle     a. mixed ischemic and nonischemic CM,  EF 35%. b. s/p AICD implantation.  . ED (erectile dysfunction)   . Arthritis   . Pacemaker     medtronic  . CAD (coronary artery disease)     a. s/p mid LAD stenting with DES 2008 Dr. Daneen Schick  . ICD (implantable cardiac defibrillator) in place     medtronic, Dr. Rayann Heman  . ICD (implantable cardiac defibrillator) in place   . Sleep apnea     hx of "had surgery for"  . Automatic implantable cardioverter-defibrillator in situ   . Depression   . Numbness and tingling     Hx; 60fleft foot  . PAD (peripheral artery disease)     Severe by PV angiogram 09/2011  . Renal artery stenosis     s/p stenting 2009  . PAD (peripheral artery disease)     s/p multiple LLE bypass grafts; left mid-distal SCA occlusion by 08/2012 duplex  . Hypertension     takes Carvedilol and Losartan daily  . Constipation     takes Miralax daily as needed and Colace daily   . Type II diabetes mellitus     takes Novolog 70/30  . CHF (congestive  heart failure)     takes Lasix daily  . History of MRSA infection   . Insomnia     TAKES TRAZODONE NIGHTLY    Past Surgical History  Procedure Laterality Date  . Turp vaporization    . Cardiac defibrillator placement  12/26/09    pacemaker combo  . Cervical epidural injection  2013  . Femoral-tibial bypass graft  09/25/2011    Procedure: BYPASS GRAFT FEMORAL-TIBIAL ARTERY;  Surgeon: JMal Misty MD;  [facility]  Service: Vascular;  Laterality: Left;  Left Femoral - Anterior Tibial Bypass;  saphenous vein graft from left leg  . Intraoperative arteriogram  09/25/2011    Procedure: INTRA OPERATIVE ARTERIOGRAM;  Surgeon: JMal Misty MD;  [place]  Service: Vascular;  Laterality: Left;  . Femoral-tibial bypass graft  02/07/2012    Procedure: BYPASS GRAFT FEMORAL-TIBIAL ARTERY;  Surgeon: TRosetta Posner MD;  [place] [facility]  Service: Vascular;  Laterality: Left;  Thrombectomy Left Femoral - Tibial Bypass Graft  . Embolectomy  02/07/2012    Procedure: EMBOLECTOMY;  Surgeon: TRosetta Posner MD;  [place] [facility] [place]  Service: Vascular;  Laterality: Left;  . Femoral-tibial bypass graft  04/03/2012    Procedure: BYPASS GRAFT FEMORAL-TIBIAL ARTERY;  Surgeon: JJeneen Rinks  Rockwell Alexandria, MD;  Location: [place];  Service: Vascular;  Laterality: Left;  Redo  . Insert / replace / remove pacemaker  2007  . Coronary angioplasty with stent placement  ~ 2000  . Coronary angioplasty    . Uvulopalatopharyngoplasty (uppp)/tonsillectomy/septoplasty  06/30/2003    Archie Endo 06/30/2003 (07/10/2012)  . Shoulder open rotator cuff repair Right 2001    repair of lacerated right/notes 10/11/1999  (07/10/2012)  . Foot surgery Right 03/20/2001    "have plates and screws in; didn't break it" (07/10/2012)  . Carpal tunnel release Right 2002    Archie Endo 03/20/2001 (07/10/2012)  . Biceps tendon repair Right 2001    Archie Endo 03/20/2001 (07/10/2012)  . Renal artery stent  2009  . Femoral-popliteal bypass graft Left 09/16/2012    Procedure: LEFT  FEMORAL-POPLITEAL BYPASS GRAFT WITH GORTEX Propaten Graft 6x80 Thin Wall and Left lower leg Angiogram;  Surgeon: Mal Misty, MD;  Location: [place];  Service: Vascular;  Laterality: Left;  . Colonoscopy      Hx; of  . Tonsillectomy    . Adenoidectomy      Hx; of   . Femoral-tibial bypass graft Left 09/30/2012    Procedure: REDO LEFT FEMORAL-ANTERIOR TIBIAL ARTERY BYPASS USING COMPOSITE CEPHALIC AND BASILIC VEIN GRAFT FROM LEFT ARM;  Surgeon: Mal Misty, MD;  Location: [place];  Service: Vascular;  Laterality: Left;  . I&d extremity Left 10/21/2012    Procedure: EXPLORATION AND DEBRIDEMENT OF LEFT GROIN WOUND;  Surgeon: Mal Misty, MD;  Location: [place];  Service: Vascular;  Laterality: Left;  . Patch angioplasty Left 10/21/2012    Procedure: PATCH ANGIOPLASTY;  Surgeon: Mal Misty, MD;  Location: [place];  Service: Vascular;  Laterality: Left;  . Groin debridement Left 11/12/2012    Procedure: CLOSURE INGUINAL WOUND;  Surgeon: Mal Misty, MD;  Location: [place];  Service: Vascular;  Laterality: Left;  . Embolectomy Left 12/16/2012    Procedure: THROMBECTOMY  LEFT LEG BYPASS;  Surgeon: Elam Dutch, MD;  Location: [place];  Service: Vascular;  Laterality: Left;  . Leg amputation above knee Left 02/04/2013    DR LAWSON  . Amputation Left 02/04/2013    Procedure: AMPUTATION ABOVE KNEE-LEFT;  Surgeon: Mal Misty, MD;  Location: [place];  Service: Vascular;  Laterality: Left;  . Removal of graft Left 04/16/2013    Procedure: I & D LEFT AKA WOUND, POSSIBLE REMOVAL OF INFECTED GORTEX GRAFT;  Surgeon: Mal Misty, MD;  Location: [place];  Service: Vascular;  Laterality: Left;  . Abdominal aortagram N/A 09/11/2011    Procedure: ABDOMINAL Maxcine Ham;  Surgeon: Wellington Hampshire, MD;  Location[facility] CATH LAB;  Service: Cardiovascular;  Laterality: N/A;  . Lower extremity angiogram Left 12/25/2011    Procedure: LOWER EXTREMITY ANGIOGRAM;  Surgeon: Serafina Mitchell, MD;  Location: [facility] CATH LAB;   Service: Cardiovascular;  Laterality: Left;  lt leg angio co2  . Abdominal angiogram  12/25/2011    Procedure: ABDOMINAL ANGIOGRAM;  Surgeon: Serafina Mitchell, MD;  [facility] LAB;  Service: Cardiovascular;;  . Lower extremity angiogram N/A 07/07/2012    Procedure: LOWER EXTREMITY ANGIOGRAM;  Surgeon: Conrad [place], MD;  Location: [facility] CATH LAB;  Service: Cardiovascular;  Laterality: N/A;  . Lower extremity angiogram N/A 09/15/2012    Procedure: LOWER EXTREMITY ANGIOGRAM;  Surgeon: Serafina Mitchell, MD;  Location: [facility] CATH LAB;  Service: Cardiovascular;  Laterality: N/A;  . Upper extremity angiogram  09/15/2012  Procedure: UPPER EXTREMITY ANGIOGRAM;  Surgeon: Serafina Mitchell, MD;  Location: [facility] CATH LAB;  Service: Cardiovascular;;  . Biv icd genertaor change out N/A 11/09/2013    Procedure: BIV ICD GENERTAOR CHANGE OUT;  Surgeon: Coralyn Mark, MD;  Location[facility] CATH LAB;  Service: Cardiovascular;  Laterality: N/A;    There were no vitals filed for this visit.  Visit Diagnosis:  No diagnosis found.      Subjective Assessment - 08/08/14 0935    Subjective No falls to report. Prosthesis has maintained suction over last few days. No pain currently, was 8/10 this am (phantom pain), gone now.   Currently in Pain? No/denies   Pain Score 0-No pain           [facility] Adult PT Treatment/Exercise - 08/08/14 0942    Transfers   Sit to Stand 5: Supervision;With upper extremity assist;With armrests;From chair/3-in-1   Sit to Stand Details (indicate cue type and reason) cues for hand placement and to engage prosthetic knee    Stand to Sit 5: Supervision;With upper extremity assist;With armrests;To chair/3-in-1   Stand to Sit Details cues to back up all the way to the chair before sitting down, to reach back and use arms to control descent. cues to engage prosthetic knee   Ambulation/Gait   Ambulation/Gait Yes   Ambulation/Gait Assistance 5: Supervision;4: Min guard   Ambulation/Gait Assistance  Details cues on posture and step length. min assist needed with last 3-5 feet as pt turned to back up to wheelchair and sit down due to prosthesis knee not engaged and buckling under pt.    Ambulation Distance (Feet) 155 Feet  x1, 125 x1   Assistive device Rollator;Prosthesis   Gait Pattern Decreased step length - right;Decreased stance time - left;Decreased stride length;Antalgic;Step-through pattern;Trunk flexed   Ambulation Surface Level;Indoor   Dynamic Standing Balance   Dynamic Standing - Balance Support No upper extremity supported;During functional activity   Dynamic Standing - Level of Assistance 4: Min assist   Dynamic Standing - Balance Activities Compliant surfaces;Eyes closed;Head turns;Head nods   Dynamic Standing - Comments on air ex in parallel bars: eyes closed with no head movements, eyes closed with head nods/shakes. cues on posture and equal weight bearing on each leg.                     High Level Balance   High Level Balance Activities Side stepping;Tandem walking   High Level Balance Comments in parallel bars: 3 laps toward each side with cues on posture and decreased UE weight bearing with activity and for neutral foot/hip position with each step. tandem gait x 4 laps forward, cues on posture and decreased UE weight bearing. cues for stepping with pivot turns in bars, not to drag/slide feet.                   Prosthetics   Current prosthetic wear tolerance (days/week)  daily   Current prosthetic wear tolerance (#hours/day)  >/= 90% of awake hours   Residual limb condition  intact per pt report   Education Provided Residual limb care;Proper weight-bearing schedule/adjustment   Person(s) Educated Patient   Education Method Explanation;Verbal cues   Education Method Verbalized understanding;Needs further instruction   Donning Prosthesis Supervision   Doffing Prosthesis Modified independent (device/increased time)            PT Short Term Goals - 08/08/14 0936     PT SHORT TERM GOAL #1   Title  donnes prosthesis with new suspension correctly independently. (Target Date: 06/17/14)   Status Achieved   PT SHORT TERM GOAL #2   Title demonstrates understanding of initial HEP. (Target Date: 06/17/14)   Status Achieved   PT SHORT TERM GOAL #3   Title wears prosthesis >70% of awake hours >/= 5 days /wk. (Target Date: 06/17/14)   Status Achieved   PT SHORT TERM GOAL #4   Title ambulates 100' with rolling walker & prosthesis with cues only for deviations. (Target Date: 06/17/14)   Status Achieved   PT SHORT TERM GOAL #5   Title stands 30sec without UE support with supervision. (Target Date: 06/17/14)   Baseline MET 07/14/14   Status Achieved   PT SHORT TERM GOAL #6   Title pt ambulates 150' with rollater with supervision. (Target Date 08/11/14)   Status New   PT SHORT TERM GOAL #7   Title pt negotiates ramps and curb with rollator with supervision. (Target Date 08/11/14)   Status New   PT SHORT TERM GOAL #8   Title pt able to transfer safely from [facility] to chair with armrests with supervision. (Target Date 08/11/14)   Status New           PT Long Term Goals - 07/14/14 1648    PT LONG TERM GOAL #1   Title wears prosthesis >80% of awake hours daily. (Target Date: 07/15/14)   Baseline MET 07/14/14   Time 8   Period Weeks   Status Achieved   PT LONG TERM GOAL #2   Title verbalize understanding of ongoing fitness plan. (NEW Target Date: 09/09/14)   Baseline MET 07/14/14 PT is continuing to update   Time 8   Period Weeks   Status On-going   PT LONG TERM GOAL #3   Title ambulates 200' with rolling walker & prosthesis modified independent. (NEW Target Date: 09/09/14)   Baseline 07/14/14 progressing. pt able to ambulate for 100' with rollator with supervision   Time 8   Period Weeks   Status On-going   PT LONG TERM GOAL #4   Title Berg Balance >18/56 (Target Date: 07/15/14)   Baseline MET 07/14/14. BERG 20/56   Time 8   Period Weeks   Status Achieved   PT LONG TERM  GOAL #5   Title negotiate ramp, curb & stairs (1 rail) with rolling walker & prosthesis modified independent. (New Target Date: 09/09/14)   Baseline 07/14/14 progressing. pt able to negotiate ramp and curb with rollator with minA and constant verbal cues   Time 8   Period Weeks   Status On-going   PT LONG TERM GOAL #6   Title BERG balance > /= 24/56   Time 8   Period Weeks   Status New               Plan - 08/08/14 8115    Clinical impression Pt making slow, steady progress toward goals.   Pt will benefit from skilled therapeutic intervention in order to improve on the following deficits Abnormal gait;Cardiopulmonary status limiting activity;Decreased activity tolerance;Decreased balance;Decreased endurance;Decreased knowledge of use of DME;Decreased mobility;Decreased range of motion;Decreased strength;Pain;Other (comment)   Rehab Potential Good   PT Frequency 2x / week   PT Duration 8 weeks   PT Treatment/Interventions ADLs/Self Care Home Management;DME Instruction;Gait training;Stair training;Functional mobility training;Therapeutic activities;Therapeutic exercise;Balance training;Neuromuscular re-education;Patient/family education;Other (comment)   PT Next Visit Plan continue toward LTG's   Consulted and Agree with Plan of Care Patient  Problem List Patient Active Problem List   Diagnosis Date Noted  . Phantom limb syndrome with pain 08/03/2014  . Diabetic polyneuropathy associated with diabetes mellitus due to underlying condition 07/12/2014  . CVA (cerebral infarction) 06/15/2014  . Expressive aphasia 06/15/2014  . CKD (chronic kidney disease) stage 4, GFR 15-29 ml/min 06/15/2014  . DM (diabetes mellitus), type 2, uncontrolled, with renal complications 67/70/3403  . Unilateral AKA 02/09/2013  . S/P ICD (internal cardiac defibrillator) procedure 05/18/2012  . PAD (peripheral artery disease) 09/04/2011  . CAD (coronary artery disease)   . Chronic systolic  congestive heart failure 07/06/2010  . Gout 09/18/2007  . Essential hypertension 01/23/2007  . Atrial fibrillation 01/23/2007  . BPH (benign prostatic hyperplasia) 01/23/2007    Willow Ora 08/08/2014, 10:13 AM  Willow Ora, PTA, [facility], [place] [address], [place], [place] [address] [phone] 08/10/2014, 8:08 AM

## 2014-08-10 ENCOUNTER — Encounter: Payer: Self-pay | Admitting: Physical Therapy

## 2014-08-10 ENCOUNTER — Ambulatory Visit: Payer: Commercial Managed Care - HMO | Admitting: Physical Therapy

## 2014-08-10 DIAGNOSIS — R269 Unspecified abnormalities of gait and mobility: Secondary | ICD-10-CM | POA: Diagnosis not present

## 2014-08-10 DIAGNOSIS — R531 Weakness: Secondary | ICD-10-CM

## 2014-08-10 DIAGNOSIS — Z7409 Other reduced mobility: Secondary | ICD-10-CM

## 2014-08-10 DIAGNOSIS — R2689 Other abnormalities of gait and mobility: Secondary | ICD-10-CM

## 2014-08-10 NOTE — Therapy (Signed)
[facility] [address] [place], [place], [address] Phone: [phone]   Fax:  [phone]  Physical Therapy Treatment  Patient Details  Name: [name] MRN: [id number] Date of Birth: 1934-11-14 Referring Provider:  Laurey Morale, MD  Encounter Date: 08/10/2014      PT End of Session - 08/10/14 0857    Visit Number 19   Number of Visits 30   Date for PT Re-Evaluation 09/09/14   PT Start Time 0845   PT Stop Time 0926   PT Time Calculation (min) 41 min   Equipment Utilized During Treatment Gait belt   Activity Tolerance Patient limited by fatigue   Behavior During Therapy [facility] for tasks assessed/performed      Past Medical History  Diagnosis Date  . Gout   . Benign prostatic hypertrophy     takes Proscar daily  . Atrial fibrillation     takes Warfarin daily  . Chronic systolic dysfunction of left ventricle     a. mixed ischemic and nonischemic CM,  EF 35%. b. s/p AICD implantation.  . ED (erectile dysfunction)   . Arthritis   . Pacemaker     medtronic  . CAD (coronary artery disease)     a. s/p mid LAD stenting with DES 2008 Dr. Daneen Schick  . ICD (implantable cardiac defibrillator) in place     medtronic, Dr. Rayann Heman  . ICD (implantable cardiac defibrillator) in place   . Sleep apnea     hx of "had surgery for"  . Automatic implantable cardioverter-defibrillator in situ   . Depression   . Numbness and tingling     Hx; 31fleft foot  . PAD (peripheral artery disease)     Severe by PV angiogram 09/2011  . Renal artery stenosis     s/p stenting 2009  . PAD (peripheral artery disease)     s/p multiple LLE bypass grafts; left mid-distal SCA occlusion by 08/2012 duplex  . Hypertension     takes Carvedilol and Losartan daily  . Constipation     takes Miralax daily as needed and Colace daily   . Type II diabetes mellitus     takes Novolog 70/30  . CHF (congestive heart failure)     takes Lasix  daily  . History of MRSA infection   . Insomnia     TAKES TRAZODONE NIGHTLY    Past Surgical History  Procedure Laterality Date  . Turp vaporization    . Cardiac defibrillator placement  12/26/09    pacemaker combo  . Cervical epidural injection  2013  . Femoral-tibial bypass graft  09/25/2011    Procedure: BYPASS GRAFT FEMORAL-TIBIAL ARTERY;  Surgeon: JMal Misty MD;  [facility]  Service: Vascular;  Laterality: Left;  Left Femoral - Anterior Tibial Bypass;  saphenous vein graft from left leg  . Intraoperative arteriogram  09/25/2011    Procedure: INTRA OPERATIVE ARTERIOGRAM;  Surgeon: JMal Misty MD;  [place] [facility]  Service: Vascular;  Laterality: Left;  . Femoral-tibial bypass graft  02/07/2012    Procedure: BYPASS GRAFT FEMORAL-TIBIAL ARTERY;  Surgeon: TRosetta Posner MD;  [place] [facility]  Service: Vascular;  Laterality: Left;  Thrombectomy Left Femoral - Tibial Bypass Graft  . Embolectomy  02/07/2012    Procedure: EMBOLECTOMY;  Surgeon: TRosetta Posner MD;  [place] [facility]  Service: Vascular;  Laterality: Left;  . Femoral-tibial bypass graft  04/03/2012    Procedure: BYPASS GRAFT FEMORAL-TIBIAL ARTERY;  Surgeon: Mal Misty, MD;  Location: [facility];  Service: Vascular;  Laterality: Left;  Redo  . Insert / replace / remove pacemaker  2007  . Coronary angioplasty with stent placement  ~ 2000  . Coronary angioplasty    . Uvulopalatopharyngoplasty (uppp)/tonsillectomy/septoplasty  06/30/2003    Archie Endo 06/30/2003 (07/10/2012)  . Shoulder open rotator cuff repair Right 2001    repair of lacerated right/notes 10/11/1999  (07/10/2012)  . Foot surgery Right 03/20/2001    "have plates and screws in; didn't break it" (07/10/2012)  . Carpal tunnel release Right 2002    Archie Endo 03/20/2001 (07/10/2012)  . Biceps tendon repair Right 2001    Archie Endo 03/20/2001 (07/10/2012)  . Renal artery stent  2009  . Femoral-popliteal bypass graft Left 09/16/2012    Procedure: LEFT FEMORAL-POPLITEAL BYPASS GRAFT  WITH GORTEX Propaten Graft 6x80 Thin Wall and Left lower leg Angiogram;  Surgeon: Mal Misty, MD;  Location: [facility];  Service: Vascular;  Laterality: Left;  . Colonoscopy      Hx; of  . Tonsillectomy    . Adenoidectomy      Hx; of   . Femoral-tibial bypass graft Left 09/30/2012    Procedure: REDO LEFT FEMORAL-ANTERIOR TIBIAL ARTERY BYPASS USING COMPOSITE CEPHALIC AND BASILIC VEIN GRAFT FROM LEFT ARM;  Surgeon: Mal Misty, MD;  Location: [facility];  Service: Vascular;  Laterality: Left;  . I&d extremity Left 10/21/2012    Procedure: EXPLORATION AND DEBRIDEMENT OF LEFT GROIN WOUND;  Surgeon: Mal Misty, MD;  Location: [facility];  Service: Vascular;  Laterality: Left;  . Patch angioplasty Left 10/21/2012    Procedure: PATCH ANGIOPLASTY;  Surgeon: Mal Misty, MD;  Location: [facility];  Service: Vascular;  Laterality: Left;  . Groin debridement Left 11/12/2012    Procedure: CLOSURE INGUINAL WOUND;  Surgeon: Mal Misty, MD;  Location: [facility];  Service: Vascular;  Laterality: Left;  . Embolectomy Left 12/16/2012    Procedure: THROMBECTOMY  LEFT LEG BYPASS;  Surgeon: Elam Dutch, MD;  Location: [facility];  Service: Vascular;  Laterality: Left;  . Leg amputation above knee Left 02/04/2013    DR LAWSON  . Amputation Left 02/04/2013    Procedure: AMPUTATION ABOVE KNEE-LEFT;  Surgeon: Mal Misty, MD;  Location: [facility] [place];  Service: Vascular;  Laterality: Left;  . Removal of graft Left 04/16/2013    Procedure: I & D LEFT AKA WOUND, POSSIBLE REMOVAL OF INFECTED GORTEX GRAFT;  Surgeon: Mal Misty, MD;  Location: [facility];  Service: Vascular;  Laterality: Left;  . Abdominal aortagram N/A 09/11/2011    Procedure: ABDOMINAL Maxcine Ham;  Surgeon: Wellington Hampshire, MD;  Location: [facility] CATH LAB;  Service: Cardiovascular;  Laterality: N/A;  . Lower extremity angiogram Left 12/25/2011    Procedure: LOWER EXTREMITY ANGIOGRAM;  Surgeon: Serafina Mitchell, MD;  Location: [facility] CATH LAB;  Service: Cardiovascular;   Laterality: Left;  lt leg angio co2  . Abdominal angiogram  12/25/2011    Procedure: ABDOMINAL ANGIOGRAM;  Surgeon: Serafina Mitchell, MD;  Location: [facility] CATH LAB;  Service: Cardiovascular;;  . Lower extremity angiogram N/A 07/07/2012    Procedure: LOWER EXTREMITY ANGIOGRAM;  Surgeon: Conrad Dillon, MD;  Location[facility] LAB;  Service: Cardiovascular;  Laterality: N/A;  . Lower extremity angiogram N/A 09/15/2012    Procedure: LOWER EXTREMITY ANGIOGRAM;  Surgeon: Serafina Mitchell, MD;  Location: [facility] CATH LAB;  Service: Cardiovascular;  Laterality: N/A;  . Upper extremity angiogram  09/15/2012  Procedure: UPPER EXTREMITY ANGIOGRAM;  Surgeon: Serafina Mitchell, MD;  Location[facility] C[facility] LAB;  Service: Cardiovascular;;  . Biv icd genertaor change out N/A 11/09/2013    Procedure: BIV ICD GENERTAOR CHANGE OUT;  Surgeon: Coralyn Mark, MD;  Location: [facility] CATH LAB;  Service: Cardiovascular;  Laterality: N/A;    There were no vitals filed for this visit.  Visit Diagnosis:  Abnormality of gait  Impaired functional mobility and activity tolerance  Balance problems  Weakness generalized      Subjective Assessment - 08/10/14 0851    Subjective No falls to reports. Reports the socket is "bitting him" in the groin area. Not using socks at this time as he did not get any.   Currently in Pain? Yes   Pain Score 5    Pain Location Leg   Pain Orientation Left   Pain Descriptors / Indicators Aching   Pain Onset More than a month ago   Pain Frequency Constant   Aggravating Factors  prosthetic wear   Pain Relieving Factors medicine, rest, removing prosthesis            [facility] Adult PT Treatment/Exercise - 08/10/14 0902    Transfers   Sit to Stand 5: Supervision;With upper extremity assist;With armrests;From chair/3-in-1   Stand to Sit 5: Supervision;With upper extremity assist;With armrests;To chair/3-in-1   Ambulation/Gait   Ambulation/Gait Yes   Ambulation/Gait Assistance 5: Supervision    Ambulation/Gait Assistance Details cues on posture and for equal step length with gait.   Ambulation Distance (Feet) 125 Feet  x1, 160 x 2   Assistive device Rolling walker;Prosthesis   Gait Pattern Decreased step length - right;Decreased stance time - left;Decreased stride length;Antalgic;Step-through pattern;Trunk flexed   Ambulation Surface Level;Indoor   High Level Balance   High Level Balance Activities Side stepping   High Level Balance Comments in parallel bars x 3 laps each way with cues to lift foot each time it advanced, keep toes pointed out and on posture with decreased UE weight bearing.        Prosthetics   Prosthetic Care Comments  Call placed to prosthetist to set up appointment to have pads added to socket, for pt to get socks for suction liner and to have is phob for hsi prosthesis fixed. Pt to see Merry Proud next Thursday at 11am.   Current prosthetic wear tolerance (days/week)  daily   Current prosthetic wear tolerance (#hours/day)  >/= 90% of awake hours   Residual limb condition  intact per pt report   Education Provided Correct ply sock adjustment   Person(s) Educated Patient   Education Method Explanation   Education Method Verbalized understanding   Donning Prosthesis Supervision   Doffing Prosthesis Modified independent (device/increased time)           PT Short Term Goals - 08/08/14 0936    PT SHORT TERM GOAL #1   Title donnes prosthesis with new suspension correctly independently. (Target Date: 06/17/14)   Status Achieved   PT SHORT TERM GOAL #2   Title demonstrates understanding of initial HEP. (Target Date: 06/17/14)   Status Achieved   PT SHORT TERM GOAL #3   Title wears prosthesis >70% of awake hours >/= 5 days /wk. (Target Date: 06/17/14)   Status Achieved   PT SHORT TERM GOAL #4   Title ambulates 100' with rolling walker & prosthesis with cues only for deviations. (Target Date: 06/17/14)   Status Achieved   PT SHORT TERM GOAL #5   Title stands 30sec  without UE support with supervision. (Target Date: 06/17/14)   Baseline MET 07/14/14   Status Achieved   PT SHORT TERM GOAL #6   Title pt ambulates 150' with rollater with supervision. (Target Date 08/11/14)   Status New   PT SHORT TERM GOAL #7   Title pt negotiates ramps and curb with rollator with supervision. (Target Date 08/11/14)   Status New   PT SHORT TERM GOAL #8   Title pt able to transfer safely from [facility] to chair with armrests with supervision. (Target Date 08/11/14)   Status New           PT Long Term Goals - 07/14/14 1648    PT LONG TERM GOAL #1   Title wears prosthesis >80% of awake hours daily. (Target Date: 07/15/14)   Baseline MET 07/14/14   Time 8   Period Weeks   Status Achieved   PT LONG TERM GOAL #2   Title verbalize understanding of ongoing fitness plan. (NEW Target Date: 09/09/14)   Baseline MET 07/14/14 PT is continuing to update   Time 8   Period Weeks   Status On-going   PT LONG TERM GOAL #3   Title ambulates 200' with rolling walker & prosthesis modified independent. (NEW Target Date: 09/09/14)   Baseline 07/14/14 progressing. pt able to ambulate for 100' with rollator with supervision   Time 8   Period Weeks   Status On-going   PT LONG TERM GOAL #4   Title Berg Balance >18/56 (Target Date: 07/15/14)   Baseline MET 07/14/14. BERG 20/56   Time 8   Period Weeks   Status Achieved   PT LONG TERM GOAL #5   Title negotiate ramp, curb & stairs (1 rail) with rolling walker & prosthesis modified independent. (New Target Date: 09/09/14)   Baseline 07/14/14 progressing. pt able to negotiate ramp and curb with rollator with minA and constant verbal cues   Time 8   Period Weeks   Status On-going   PT LONG TERM GOAL #6   Title BERG balance > /= 24/56   Time 8   Period Weeks   Status New            Plan - 08/10/14 0857    Clinical Impression Statement Pt continues to make slow, steady progress toward goals.   Pt will benefit from skilled therapeutic intervention in  order to improve on the following deficits Abnormal gait;Cardiopulmonary status limiting activity;Decreased activity tolerance;Decreased balance;Decreased endurance;Decreased knowledge of use of DME;Decreased mobility;Decreased range of motion;Decreased strength;Pain;Other (comment)   Rehab Potential Good   PT Frequency 2x / week   PT Duration 8 weeks   PT Treatment/Interventions ADLs/Self Care Home Management;DME Instruction;Gait training;Stair training;Functional mobility training;Therapeutic activities;Therapeutic exercise;Balance training;Neuromuscular re-education;Patient/family education;Other (comment)   PT Next Visit Plan continue toward LTG's   Consulted and Agree with Plan of Care Patient      Problem List Patient Active Problem List   Diagnosis Date Noted  . Phantom limb syndrome with pain 08/03/2014  . Diabetic polyneuropathy associated with diabetes mellitus due to underlying condition 07/12/2014  . CVA (cerebral infarction) 06/15/2014  . Expressive aphasia 06/15/2014  . CKD (chronic kidney disease) stage 4, GFR 15-29 ml/min 06/15/2014  . DM (diabetes mellitus), type 2, uncontrolled, with renal complications 59/93/5701  . Unilateral AKA 02/09/2013  . S/P ICD (internal cardiac defibrillator) procedure 05/18/2012  . PAD (peripheral artery disease) 09/04/2011  . CAD (coronary artery disease)   . Chronic systolic congestive heart failure 07/06/2010  .  Gout 09/18/2007  . Essential hypertension 01/23/2007  . Atrial fibrillation 01/23/2007  . BPH (benign prostatic hyperplasia) 01/23/2007    Willow Ora 08/10/2014, 8:23 PM  Willow Ora, PTA, [facility] [address], [place], [place] [address] [phone] 08/10/2014, 8:23 PM

## 2014-08-15 ENCOUNTER — Ambulatory Visit: Payer: Commercial Managed Care - HMO | Admitting: Physical Therapy

## 2014-08-15 ENCOUNTER — Encounter: Payer: Self-pay | Admitting: Physical Therapy

## 2014-08-15 DIAGNOSIS — R531 Weakness: Secondary | ICD-10-CM

## 2014-08-15 DIAGNOSIS — R269 Unspecified abnormalities of gait and mobility: Secondary | ICD-10-CM | POA: Diagnosis not present

## 2014-08-15 DIAGNOSIS — Z7409 Other reduced mobility: Secondary | ICD-10-CM

## 2014-08-15 DIAGNOSIS — R2689 Other abnormalities of gait and mobility: Secondary | ICD-10-CM

## 2014-08-15 DIAGNOSIS — Z89612 Acquired absence of left leg above knee: Secondary | ICD-10-CM

## 2014-08-15 NOTE — Therapy (Signed)
[facility] [address] [place], [place], [address] Phone: [phone]   Fax:  [phone]  Physical Therapy Treatment  Patient Details  Name: [name] MRN: [id number] Date of Birth: 1934-06-28 Referring Provider:  Laurey Morale, MD  Encounter Date: 08/15/2014      PT End of Session - 08/15/14 1015    Visit Number 20   Number of Visits 30   Date for PT Re-Evaluation 09/09/14   PT Start Time 1016   PT Stop Time 1100   PT Time Calculation (min) 44 min   Equipment Utilized During Treatment Gait belt   Activity Tolerance Patient limited by fatigue   Behavior During Therapy [facility] for tasks assessed/performed      Past Medical History  Diagnosis Date  . Gout   . Benign prostatic hypertrophy     takes Proscar daily  . Atrial fibrillation     takes Warfarin daily  . Chronic systolic dysfunction of left ventricle     a. mixed ischemic and nonischemic CM,  EF 35%. b. s/p AICD implantation.  . ED (erectile dysfunction)   . Arthritis   . Pacemaker     medtronic  . CAD (coronary artery disease)     a. s/p mid LAD stenting with DES 2008 Dr. Daneen Schick  . ICD (implantable cardiac defibrillator) in place     medtronic, Dr. Rayann Heman  . ICD (implantable cardiac defibrillator) in place   . Sleep apnea     hx of "had surgery for"  . Automatic implantable cardioverter-defibrillator in situ   . Depression   . Numbness and tingling     Hx; 74fleft foot  . PAD (peripheral artery disease)     Severe by PV angiogram 09/2011  . Renal artery stenosis     s/p stenting 2009  . PAD (peripheral artery disease)     s/p multiple LLE bypass grafts; left mid-distal SCA occlusion by 08/2012 duplex  . Hypertension     takes Carvedilol and Losartan daily  . Constipation     takes Miralax daily as needed and Colace daily   . Type II diabetes mellitus     takes Novolog 70/30  . CHF (congestive heart failure)     takes Lasix  daily  . History of MRSA infection   . Insomnia     TAKES TRAZODONE NIGHTLY    Past Surgical History  Procedure Laterality Date  . Turp vaporization    . Cardiac defibrillator placement  12/26/09    pacemaker combo  . Cervical epidural injection  2013  . Femoral-tibial bypass graft  09/25/2011    Procedure: BYPASS GRAFT FEMORAL-TIBIAL ARTERY;  Surgeon: JMal Misty MD;  [facility]  Service: Vascular;  Laterality: Left;  Left Femoral - Anterior Tibial Bypass;  saphenous vein graft from left leg  . Intraoperative arteriogram  09/25/2011    Procedure: INTRA OPERATIVE ARTERIOGRAM;  Surgeon: JMal Misty MD;  [place] [facility]  Service: Vascular;  Laterality: Left;  . Femoral-tibial bypass graft  02/07/2012    Procedure: BYPASS GRAFT FEMORAL-TIBIAL ARTERY;  Surgeon: TRosetta Posner MD;  [place] [facility]  Service: Vascular;  Laterality: Left;  Thrombectomy Left Femoral - Tibial Bypass Graft  . Embolectomy  02/07/2012    Procedure: EMBOLECTOMY;  Surgeon: TRosetta Posner MD;  [place] [facility]  Service: Vascular;  Laterality: Left;  . Femoral-tibial bypass graft  04/03/2012    Procedure: BYPASS GRAFT FEMORAL-TIBIAL ARTERY;  Surgeon: Mal Misty, MD;  Location: [place];  Service: Vascular;  Laterality: Left;  Redo  . Insert / replace / remove pacemaker  2007  . Coronary angioplasty with stent placement  ~ 2000  . Coronary angioplasty    . Uvulopalatopharyngoplasty (uppp)/tonsillectomy/septoplasty  06/30/2003    Archie Endo 06/30/2003 (07/10/2012)  . Shoulder open rotator cuff repair Right 2001    repair of lacerated right/notes 10/11/1999  (07/10/2012)  . Foot surgery Right 03/20/2001    "have plates and screws in; didn't break it" (07/10/2012)  . Carpal tunnel release Right 2002    Archie Endo 03/20/2001 (07/10/2012)  . Biceps tendon repair Right 2001    Archie Endo 03/20/2001 (07/10/2012)  . Renal artery stent  2009  . Femoral-popliteal bypass graft Left 09/16/2012    Procedure: LEFT FEMORAL-POPLITEAL BYPASS GRAFT  WITH GORTEX Propaten Graft 6x80 Thin Wall and Left lower leg Angiogram;  Surgeon: Mal Misty, MD;  Location: [place];  Service: Vascular;  Laterality: Left;  . Colonoscopy      Hx; of  . Tonsillectomy    . Adenoidectomy      Hx; of   . Femoral-tibial bypass graft Left 09/30/2012    Procedure: REDO LEFT FEMORAL-ANTERIOR TIBIAL ARTERY BYPASS USING COMPOSITE CEPHALIC AND BASILIC VEIN GRAFT FROM LEFT ARM;  Surgeon: Mal Misty, MD;  Location: [place];  Service: Vascular;  Laterality: Left;  . I&d extremity Left 10/21/2012    Procedure: EXPLORATION AND DEBRIDEMENT OF LEFT GROIN WOUND;  Surgeon: Mal Misty, MD;  Location: [place];  Service: Vascular;  Laterality: Left;  . Patch angioplasty Left 10/21/2012    Procedure: PATCH ANGIOPLASTY;  Surgeon: Mal Misty, MD;  Location: [place];  Service: Vascular;  Laterality: Left;  . Groin debridement Left 11/12/2012    Procedure: CLOSURE INGUINAL WOUND;  Surgeon: Mal Misty, MD;  Location: [place];  Service: Vascular;  Laterality: Left;  . Embolectomy Left 12/16/2012    Procedure: THROMBECTOMY  LEFT LEG BYPASS;  Surgeon: Elam Dutch, MD;  Location: [place];  Service: Vascular;  Laterality: Left;  . Leg amputation above knee Left 02/04/2013    DR LAWSON  . Amputation Left 02/04/2013    Procedure: AMPUTATION ABOVE KNEE-LEFT;  Surgeon: Mal Misty, MD;  Location: [place];  Service: Vascular;  Laterality: Left;  . Removal of graft Left 04/16/2013    Procedure: I & D LEFT AKA WOUND, POSSIBLE REMOVAL OF INFECTED GORTEX GRAFT;  Surgeon: Mal Misty, MD;  Location: [place];  Service: Vascular;  Laterality: Left;  . Abdominal aortagram N/A 09/11/2011    Procedure: ABDOMINAL Maxcine Ham;  Surgeon: Wellington Hampshire, MD;  Location: [facility] CATH LAB;  Service: Cardiovascular;  Laterality: N/A;  . Lower extremity angiogram Left 12/25/2011    Procedure: LOWER EXTREMITY ANGIOGRAM;  Surgeon: Serafina Mitchell, MD;  Location: [facility] CATH LAB;  Service: Cardiovascular;   Laterality: Left;  lt leg angio co2  . Abdominal angiogram  12/25/2011    Procedure: ABDOMINAL ANGIOGRAM;  Surgeon: Serafina Mitchell, MD;  Location: [facility] CATH LAB;  Service: Cardiovascular;;  . Lower extremity angiogram N/A 07/07/2012    Procedure: LOWER EXTREMITY ANGIOGRAM;  Surgeon: Conrad La Crosse, MD;  Location: [facility] CATH LAB;  Service: Cardiovascular;  Laterality: N/A;  . Lower extremity angiogram N/A 09/15/2012    Procedure: LOWER EXTREMITY ANGIOGRAM;  Surgeon: Serafina Mitchell, MD;  Location: [facility] CATH LAB;  Service: Cardiovascular;  Laterality: N/A;  . Upper extremity angiogram  09/15/2012  Procedure: UPPER EXTREMITY ANGIOGRAM;  Surgeon: Serafina Mitchell, MD;  Location: [facility] CATH LAB;  Service: Cardiovascular;;  . Biv icd genertaor change out N/A 11/09/2013    Procedure: BIV ICD GENERTAOR CHANGE OUT;  Surgeon: Coralyn Mark, MD;  Location: [facility] CATH LAB;  Service: Cardiovascular;  Laterality: N/A;    There were no vitals filed for this visit.  Visit Diagnosis:  Abnormality of gait  Impaired functional mobility and activity tolerance  Balance problems  Weakness generalized  Status post above knee amputation of left lower extremity      Subjective Assessment - 08/15/14 1023    Subjective walking "a lot" with regular rollling walker. No falls. Going to [facility] still   Currently in Pain? Yes   Pain Score 7    Pain Location Leg   Pain Descriptors / Indicators Sharp   Pain Type Phantom pain                         [facility] Adult PT Treatment/Exercise - 08/15/14 1015    Transfers   Sit to Stand 5: Supervision;With upper extremity assist;With armrests;From chair/3-in-1  from rollator both turning body 180* & rollotor 180*   Sit to Stand Details (indicate cue type and reason) cues rollator both turning body 180* & rollotor 180*   Stand to Sit 5: Supervision;With upper extremity assist;With armrests;To chair/3-in-1  to rollator both turning body 180* & rollotor 180*   Stand to Sit  Details cues on technique with rollator   Ambulation/Gait   Ambulation/Gait Yes   Ambulation/Gait Assistance 5: Supervision   Ambulation/Gait Assistance Details cues on rollator position / posture   Ambulation Distance (Feet) 115 Feet  100', 55', 60'   Assistive device Prosthesis;Rollator   Gait Pattern Decreased step length - right;Decreased stance time - left;Decreased stride length;Antalgic;Step-through pattern;Trunk flexed   Ambulation Surface Indoor;Level   Ramp 5: Supervision  contact assist   Ramp Details (indicate cue type and reason) cues on rollator control   Curb 5: Supervision  contact assist   Curb Details (indicate cue type and reason) cues on rollator & sequence   High Level Balance   High Level Balance Activities Side stepping   High Level Balance Comments at counter x 3 laps each way with cues to lift foot each time it advanced, keep toes pointed out and on posture with decreased UE weight bearing.        Prosthetics   Prosthetic Care Comments  Call placed to prosthetist to set up appointment to have pads added to socket, for pt to get socks for suction liner and to have is phob for hsi prosthesis fixed. Pt to see Merry Proud next Thursday at 11am.   Current prosthetic wear tolerance (days/week)  daily   Current prosthetic wear tolerance (#hours/day)  >/= 90% of awake hours   Residual limb condition  intact per pt report                  PT Short Term Goals - 08/08/14 0936    PT SHORT TERM GOAL #1   Title donnes prosthesis with new suspension correctly independently. (Target Date: 06/17/14)   Status Achieved   PT SHORT TERM GOAL #2   Title demonstrates understanding of initial HEP. (Target Date: 06/17/14)   Status Achieved   PT SHORT TERM GOAL #3   Title wears prosthesis >70% of awake hours >/= 5 days /wk. (Target Date: 06/17/14)   Status Achieved   PT  SHORT TERM GOAL #4   Title ambulates 100' with rolling walker & prosthesis with cues only for deviations.  (Target Date: 06/17/14)   Status Achieved   PT SHORT TERM GOAL #5   Title stands 30sec without UE support with supervision. (Target Date: 06/17/14)   Baseline MET 07/14/14   Status Achieved   PT SHORT TERM GOAL #6   Title pt ambulates 150' with rollater with supervision. (Target Date 08/11/14)   Status New   PT SHORT TERM GOAL #7   Title pt negotiates ramps and curb with rollator with supervision. (Target Date 08/11/14)   Status New   PT SHORT TERM GOAL #8   Title pt able to transfer safely from [facility] to chair with armrests with supervision. (Target Date 08/11/14)   Status New           PT Long Term Goals - 07/14/14 1648    PT LONG TERM GOAL #1   Title wears prosthesis >80% of awake hours daily. (Target Date: 07/15/14)   Baseline MET 07/14/14   Time 8   Period Weeks   Status Achieved   PT LONG TERM GOAL #2   Title verbalize understanding of ongoing fitness plan. (NEW Target Date: 09/09/14)   Baseline MET 07/14/14 PT is continuing to update   Time 8   Period Weeks   Status On-going   PT LONG TERM GOAL #3   Title ambulates 200' with rolling walker & prosthesis modified independent. (NEW Target Date: 09/09/14)   Baseline 07/14/14 progressing. pt able to ambulate for 100' with rollator with supervision   Time 8   Period Weeks   Status On-going   PT LONG TERM GOAL #4   Title Berg Balance >18/56 (Target Date: 07/15/14)   Baseline MET 07/14/14. BERG 20/56   Time 8   Period Weeks   Status Achieved   PT LONG TERM GOAL #5   Title negotiate ramp, curb & stairs (1 rail) with rolling walker & prosthesis modified independent. (New Target Date: 09/09/14)   Baseline 07/14/14 progressing. pt able to negotiate ramp and curb with rollator with minA and constant verbal cues   Time 8   Period Weeks   Status On-going   PT LONG TERM GOAL #6   Title BERG balance > /= 24/56   Time 8   Period Weeks   Status New               Plan - 09-13-2014 1015    Clinical Impression Statement Patient reports  improved activity level outside of PT. He has appt with prosthetist tomorrow to work on fit and get socks.   Pt will benefit from skilled therapeutic intervention in order to improve on the following deficits Abnormal gait;Cardiopulmonary status limiting activity;Decreased activity tolerance;Decreased balance;Decreased endurance;Decreased knowledge of use of DME;Decreased mobility;Decreased range of motion;Decreased strength;Pain;Other (comment)   Rehab Potential Good   PT Frequency 2x / week   PT Duration 8 weeks   PT Treatment/Interventions ADLs/Self Care Home Management;DME Instruction;Gait training;Stair training;Functional mobility training;Therapeutic activities;Therapeutic exercise;Balance training;Neuromuscular re-education;Patient/family education;Other (comment)   PT Next Visit Plan continue toward LTG's   Consulted and Agree with Plan of Care Patient          G-Codes - September 13, 2014 1015    Functional Assessment Tool Used ambulates 115' with rollator walker & prosthesis with SBA, negotiates ramps & curbs with rollator walker with CGA & cues   Functional Limitation Mobility: Walking and moving around   Mobility: Walking and Moving Around  Current Status [phone]) At least 60 percent but less than 80 percent impaired, limited or restricted   Mobility: Walking and Moving Around Goal Status [phone]) At least 40 percent but less than 60 percent impaired, limited or restricted      Problem List Patient Active Problem List   Diagnosis Date Noted  . Phantom limb syndrome with pain 08/03/2014  . Diabetic polyneuropathy associated with diabetes mellitus due to underlying condition 07/12/2014  . CVA (cerebral infarction) 06/15/2014  . Expressive aphasia 06/15/2014  . CKD (chronic kidney disease) stage 4, GFR 15-29 ml/min 06/15/2014  . DM (diabetes mellitus), type 2, uncontrolled, with renal complications 32/04/3341  . Unilateral AKA 02/09/2013  . S/P ICD (internal cardiac defibrillator)  procedure 05/18/2012  . PAD (peripheral artery disease) 09/04/2011  . CAD (coronary artery disease)   . Chronic systolic congestive heart failure 07/06/2010  . Gout 09/18/2007  . Essential hypertension 01/23/2007  . Atrial fibrillation 01/23/2007  . BPH (benign prostatic hyperplasia) 01/23/2007    Jamey Reas PT, DPT 08/15/2014, 10:19 PM  [facility] [address] [place], [place], [address] Phone: [phone]   Fax:  [phone]

## 2014-08-17 ENCOUNTER — Ambulatory Visit: Payer: Commercial Managed Care - HMO | Admitting: Physical Therapy

## 2014-08-17 ENCOUNTER — Encounter: Payer: Self-pay | Admitting: Physical Therapy

## 2014-08-17 DIAGNOSIS — R2689 Other abnormalities of gait and mobility: Secondary | ICD-10-CM

## 2014-08-17 DIAGNOSIS — R269 Unspecified abnormalities of gait and mobility: Secondary | ICD-10-CM | POA: Diagnosis not present

## 2014-08-17 DIAGNOSIS — Z7409 Other reduced mobility: Secondary | ICD-10-CM

## 2014-08-17 DIAGNOSIS — R531 Weakness: Secondary | ICD-10-CM

## 2014-08-18 NOTE — Therapy (Signed)
[facility] [address] [place], [place], [address] Phone: [phone]   Fax:  [phone]  Physical Therapy Treatment  Patient Details  Name: [name] MRN: [id number] Date of Birth: 1934-06-11 Referring Provider:  Laurey Morale, MD  Encounter Date: 08/17/2014      PT End of Session - 08/17/14 0853    Visit Number 21   Number of Visits 30   Date for PT Re-Evaluation 09/09/14   PT Start Time 0847   PT Stop Time 0928   PT Time Calculation (min) 41 min   Equipment Utilized During Treatment Gait belt   Activity Tolerance Patient limited by fatigue   Behavior During Therapy [facility] for tasks assessed/performed      Past Medical History  Diagnosis Date  . Gout   . Benign prostatic hypertrophy     takes Proscar daily  . Atrial fibrillation     takes Warfarin daily  . Chronic systolic dysfunction of left ventricle     a. mixed ischemic and nonischemic CM,  EF 35%. b. s/p AICD implantation.  . ED (erectile dysfunction)   . Arthritis   . Pacemaker     medtronic  . CAD (coronary artery disease)     a. s/p mid LAD stenting with DES 2008 Dr. Daneen Schick  . ICD (implantable cardiac defibrillator) in place     medtronic, Dr. Rayann Heman  . ICD (implantable cardiac defibrillator) in place   . Sleep apnea     hx of "had surgery for"  . Automatic implantable cardioverter-defibrillator in situ   . Depression   . Numbness and tingling     Hx; 57f left foot  . PAD (peripheral artery disease)     Severe by PV angiogram 09/2011  . Renal artery stenosis     s/p stenting 2009  . PAD (peripheral artery disease)     s/p multiple LLE bypass grafts; left mid-distal SCA occlusion by 08/2012 duplex  . Hypertension     takes Carvedilol and Losartan daily  . Constipation     takes Miralax daily as needed and Colace daily   . Type II diabetes mellitus     takes Novolog 70/30  . CHF (congestive heart failure)     takes Lasix  daily  . History of MRSA infection   . Insomnia     TAKES TRAZODONE NIGHTLY    Past Surgical History  Procedure Laterality Date  . Turp vaporization    . Cardiac defibrillator placement  12/26/09    pacemaker combo  . Cervical epidural injection  2013  . Femoral-tibial bypass graft  09/25/2011    Procedure: BYPASS GRAFT FEMORAL-TIBIAL ARTERY;  Surgeon: Mal Misty, MD;  Location: [facility] OR;  Service: Vascular;  Laterality: Left;  Left Femoral - Anterior Tibial Bypass;  saphenous vein graft from left leg  . Intraoperative arteriogram  09/25/2011    Procedure: INTRA OPERATIVE ARTERIOGRAM;  Surgeon: Mal Misty, MD;  Location: [place];  Service: Vascular;  Laterality: Left;  . Femoral-tibial bypass graft  02/07/2012    Procedure: BYPASS GRAFT FEMORAL-TIBIAL ARTERY;  Surgeon: Rosetta Posner, MD;  Location: [place];  Service: Vascular;  Laterality: Left;  Thrombectomy Left Femoral - Tibial Bypass Graft  . Embolectomy  02/07/2012    Procedure: EMBOLECTOMY;  Surgeon: Rosetta Posner, MD;  Location: [place];  Service: Vascular;  Laterality: Left;  . Femoral-tibial bypass graft  04/03/2012    Procedure: BYPASS GRAFT FEMORAL-TIBIAL ARTERY;  Surgeon: Mal Misty, MD;  Location: [place];  Service: Vascular;  Laterality: Left;  Redo  . Insert / replace / remove pacemaker  2007  . Coronary angioplasty with stent placement  ~ 2000  . Coronary angioplasty    . Uvulopalatopharyngoplasty (uppp)/tonsillectomy/septoplasty  06/30/2003    Archie Endo 06/30/2003 (07/10/2012)  . Shoulder open rotator cuff repair Right 2001    repair of lacerated right/notes 10/11/1999  (07/10/2012)  . Foot surgery Right 03/20/2001    "have plates and screws in; didn't break it" (07/10/2012)  . Carpal tunnel release Right 2002    Archie Endo 03/20/2001 (07/10/2012)  . Biceps tendon repair Right 2001    Archie Endo 03/20/2001 (07/10/2012)  . Renal artery stent  2009  . Femoral-popliteal bypass graft Left 09/16/2012    Procedure: LEFT FEMORAL-POPLITEAL BYPASS GRAFT  WITH GORTEX Propaten Graft 6x80 Thin Wall and Left lower leg Angiogram;  Surgeon: Mal Misty, MD;  Location: [place];  Service: Vascular;  Laterality: Left;  . Colonoscopy      Hx; of  . Tonsillectomy    . Adenoidectomy      Hx; of   . Femoral-tibial bypass graft Left 09/30/2012    Procedure: REDO LEFT FEMORAL-ANTERIOR TIBIAL ARTERY BYPASS USING COMPOSITE CEPHALIC AND BASILIC VEIN GRAFT FROM LEFT ARM;  Surgeon: Mal Misty, MD;  Location: [place];  Service: Vascular;  Laterality: Left;  . I&d extremity Left 10/21/2012    Procedure: EXPLORATION AND DEBRIDEMENT OF LEFT GROIN WOUND;  Surgeon: Mal Misty, MD;  Location: [place];  Service: Vascular;  Laterality: Left;  . Patch angioplasty Left 10/21/2012    Procedure: PATCH ANGIOPLASTY;  Surgeon: Mal Misty, MD;  Location: [place];  Service: Vascular;  Laterality: Left;  . Groin debridement Left 11/12/2012    Procedure: CLOSURE INGUINAL WOUND;  Surgeon: Mal Misty, MD;  Location: [place];  Service: Vascular;  Laterality: Left;  . Embolectomy Left 12/16/2012    Procedure: THROMBECTOMY  LEFT LEG BYPASS;  Surgeon: Elam Dutch, MD;  Location: [place];  Service: Vascular;  Laterality: Left;  . Leg amputation above knee Left 02/04/2013    DR LAWSON  . Amputation Left 02/04/2013    Procedure: AMPUTATION ABOVE KNEE-LEFT;  Surgeon: Mal Misty, MD;  Location: [place];  Service: Vascular;  Laterality: Left;  . Removal of graft Left 04/16/2013    Procedure: I & D LEFT AKA WOUND, POSSIBLE REMOVAL OF INFECTED GORTEX GRAFT;  Surgeon: Mal Misty, MD;  Location: [place];  Service: Vascular;  Laterality: Left;  . Abdominal aortagram N/A 09/11/2011    Procedure: ABDOMINAL Maxcine Ham;  Surgeon: Wellington Hampshire, MD;  Location: [facility] CATH LAB;  Service: Cardiovascular;  Laterality: N/A;  . Lower extremity angiogram Left 12/25/2011    Procedure: LOWER EXTREMITY ANGIOGRAM;  Surgeon: Serafina Mitchell, MD;  Location: [facility] CATH LAB;  Service: Cardiovascular;   Laterality: Left;  lt leg angio co2  . Abdominal angiogram  12/25/2011    Procedure: ABDOMINAL ANGIOGRAM;  Surgeon: Serafina Mitchell, MD;  Location: [facility] CATH LAB;  Service: Cardiovascular;;  . Lower extremity angiogram N/A 07/07/2012    Procedure: LOWER EXTREMITY ANGIOGRAM;  Surgeon: Conrad Quitaque, MD;  Location: [facility] CATH LAB;  Service: Cardiovascular;  Laterality: N/A;  . Lower extremity angiogram N/A 09/15/2012    Procedure: LOWER EXTREMITY ANGIOGRAM;  Surgeon: Serafina Mitchell, MD;  Location: [facility] CATH LAB;  Service: Cardiovascular;  Laterality: N/A;  . Upper extremity angiogram  09/15/2012  Procedure: UPPER EXTREMITY ANGIOGRAM;  Surgeon: Serafina Mitchell, MD;  Location: [facility] CATH LAB;  Service: Cardiovascular;;  . Biv icd genertaor change out N/A 11/09/2013    Procedure: BIV ICD GENERTAOR CHANGE OUT;  Surgeon: Coralyn Mark, MD;  Location: [facility] CATH LAB;  Service: Cardiovascular;  Laterality: N/A;    There were no vitals filed for this visit.  Visit Diagnosis:  Abnormality of gait  Impaired functional mobility and activity tolerance  Balance problems  Weakness generalized      Subjective Assessment - 08/17/14 0851    Subjective No new complains. No falls to report.    Currently in Pain? Yes   Pain Score 5    Pain Location Leg   Pain Orientation Left   Pain Descriptors / Indicators Sore;Sharp   Pain Type Phantom pain   Pain Onset More than a month ago   Pain Frequency Constant   Aggravating Factors  prosthetic wear   Pain Relieving Factors medicine, rest, removing prosthesis           [facility] Adult PT Treatment/Exercise - 08/17/14 0859    Transfers   Sit to Stand 5: Supervision;With upper extremity assist;With armrests;From chair/3-in-1   Stand to Sit 5: Supervision;With upper extremity assist;With armrests;To chair/3-in-1   Ambulation/Gait   Ambulation/Gait Yes   Ambulation/Gait Assistance 5: Supervision;4: Min guard   Ambulation Distance (Feet) 115 Feet  x1, 100 x1, 110 x1, ~60  x 2 (with ramp/curb included)   Assistive device Prosthesis;Rollator   Gait Pattern Decreased step length - right;Decreased stance time - left;Decreased stride length;Antalgic;Step-through pattern;Trunk flexed   Ambulation Surface Level;Indoor   Ramp Other (comment)  min guard assist   Curb Other (comment)  min guard assist   High Level Balance   High Level Balance Activities Side stepping   High Level Balance Comments in parallel bars: 2 laps each way with cues to decrease UE reliance during activity                   Prosthetics   Prosthetic Care Comments  Pt to see prosthetist tomorrow for adjustments for improved fit and to fix faub for prosthetic knee   Current prosthetic wear tolerance (days/week)  daily   Current prosthetic wear tolerance (#hours/day)  >/= 90% of awake hours   Residual limb condition  intact per pt report   Education Provided Proper weight-bearing schedule/adjustment;Correct ply sock adjustment   Person(s) Educated Patient   Education Method Explanation   Education Method Verbalized understanding   Donning Prosthesis Supervision   Doffing Prosthesis Modified independent (device/increased time)           PT Short Term Goals - 08/08/14 0936    PT SHORT TERM GOAL #1   Title donnes prosthesis with new suspension correctly independently. (Target Date: 06/17/14)   Status Achieved   PT SHORT TERM GOAL #2   Title demonstrates understanding of initial HEP. (Target Date: 06/17/14)   Status Achieved   PT SHORT TERM GOAL #3   Title wears prosthesis >70% of awake hours >/= 5 days /wk. (Target Date: 06/17/14)   Status Achieved   PT SHORT TERM GOAL #4   Title ambulates 100' with rolling walker & prosthesis with cues only for deviations. (Target Date: 06/17/14)   Status Achieved   PT SHORT TERM GOAL #5   Title stands 30sec without UE support with supervision. (Target Date: 06/17/14)   Baseline MET 07/14/14   Status Achieved   PT SHORT TERM GOAL #6  Title pt ambulates  150' with rollater with supervision. (Target Date 08/11/14)   Status New   PT SHORT TERM GOAL #7   Title pt negotiates ramps and curb with rollator with supervision. (Target Date 08/11/14)   Status New   PT SHORT TERM GOAL #8   Title pt able to transfer safely from [facility] to chair with armrests with supervision. (Target Date 08/11/14)   Status New           PT Long Term Goals - 07/14/14 1648    PT LONG TERM GOAL #1   Title wears prosthesis >80% of awake hours daily. (Target Date: 07/15/14)   Baseline MET 07/14/14   Time 8   Period Weeks   Status Achieved   PT LONG TERM GOAL #2   Title verbalize understanding of ongoing fitness plan. (NEW Target Date: 09/09/14)   Baseline MET 07/14/14 PT is continuing to update   Time 8   Period Weeks   Status On-going   PT LONG TERM GOAL #3   Title ambulates 200' with rolling walker & prosthesis modified independent. (NEW Target Date: 09/09/14)   Baseline 07/14/14 progressing. pt able to ambulate for 100' with rollator with supervision   Time 8   Period Weeks   Status On-going   PT LONG TERM GOAL #4   Title Berg Balance >18/56 (Target Date: 07/15/14)   Baseline MET 07/14/14. BERG 20/56   Time 8   Period Weeks   Status Achieved   PT LONG TERM GOAL #5   Title negotiate ramp, curb & stairs (1 rail) with rolling walker & prosthesis modified independent. (New Target Date: 09/09/14)   Baseline 07/14/14 progressing. pt able to negotiate ramp and curb with rollator with minA and constant verbal cues   Time 8   Period Weeks   Status On-going   PT LONG TERM GOAL #6   Title BERG balance > /= 24/56   Time 8   Period Weeks   Status New           Plan - 08/17/14 9476    Clinical Impression Statement Pt continues to make slow progress toward goals. Encouraged pt to continue to increase activity outside of PT. See's prosthetist on thursday to get socks and fit adjusted.   Pt will benefit from skilled therapeutic intervention in order to improve on the following  deficits Abnormal gait;Cardiopulmonary status limiting activity;Decreased activity tolerance;Decreased balance;Decreased endurance;Decreased knowledge of use of DME;Decreased mobility;Decreased range of motion;Decreased strength;Pain;Other (comment)   Rehab Potential Good   PT Frequency 2x / week   PT Duration 8 weeks   PT Treatment/Interventions ADLs/Self Care Home Management;DME Instruction;Gait training;Stair training;Functional mobility training;Therapeutic activities;Therapeutic exercise;Balance training;Neuromuscular re-education;Patient/family education;Other (comment)   PT Next Visit Plan continue toward LTG's   Consulted and Agree with Plan of Care Patient        Problem List Patient Active Problem List   Diagnosis Date Noted  . Phantom limb syndrome with pain 08/03/2014  . Diabetic polyneuropathy associated with diabetes mellitus due to underlying condition 07/12/2014  . CVA (cerebral infarction) 06/15/2014  . Expressive aphasia 06/15/2014  . CKD (chronic kidney disease) stage 4, GFR 15-29 ml/min 06/15/2014  . DM (diabetes mellitus), type 2, uncontrolled, with renal complications 54/65/0354  . Unilateral AKA 02/09/2013  . S/P ICD (internal cardiac defibrillator) procedure 05/18/2012  . PAD (peripheral artery disease) 09/04/2011  . CAD (coronary artery disease)   . Chronic systolic congestive heart failure 07/06/2010  . Gout 09/18/2007  . Essential  hypertension 01/23/2007  . Atrial fibrillation 01/23/2007  . BPH (benign prostatic hyperplasia) 01/23/2007    Willow Ora 08/18/2014, 12:23 PM  Willow Ora, PTA, [place] [address] [place], [place] [address] [phone] 08/18/2014, 12:23 PM

## 2014-08-22 ENCOUNTER — Encounter: Payer: Self-pay | Admitting: Physical Therapy

## 2014-08-22 ENCOUNTER — Ambulatory Visit: Payer: Commercial Managed Care - HMO | Admitting: Physical Therapy

## 2014-08-22 DIAGNOSIS — R269 Unspecified abnormalities of gait and mobility: Secondary | ICD-10-CM

## 2014-08-22 DIAGNOSIS — R531 Weakness: Secondary | ICD-10-CM

## 2014-08-22 DIAGNOSIS — R6889 Other general symptoms and signs: Secondary | ICD-10-CM

## 2014-08-22 DIAGNOSIS — Z7409 Other reduced mobility: Secondary | ICD-10-CM

## 2014-08-22 NOTE — Therapy (Signed)
[facility] [address] [place], [place], [address] Phone: [phone]   Fax:  [phone]  Physical Therapy Treatment  Patient Details  Name: [name] MRN: [id number] Date of Birth: January 23, 1935 Referring Provider:  Laurey Morale, MD  Encounter Date: 08/22/2014      PT End of Session - 08/22/14 1026    Visit Number 22   Number of Visits 30   Date for PT Re-Evaluation 09/09/14   PT Start Time 1017   PT Stop Time 1057   PT Time Calculation (min) 40 min   Equipment Utilized During Treatment Gait belt   Activity Tolerance Patient limited by fatigue   Behavior During Therapy [facility] for tasks assessed/performed      Past Medical History  Diagnosis Date  . Gout   . Benign prostatic hypertrophy     takes Proscar daily  . Atrial fibrillation     takes Warfarin daily  . Chronic systolic dysfunction of left ventricle     a. mixed ischemic and nonischemic CM,  EF 35%. b. s/p AICD implantation.  . ED (erectile dysfunction)   . Arthritis   . Pacemaker     medtronic  . CAD (coronary artery disease)     a. s/p mid LAD stenting with DES 2008 Dr. Daneen Schick  . ICD (implantable cardiac defibrillator) in place     medtronic, Dr. Rayann Heman  . ICD (implantable cardiac defibrillator) in place   . Sleep apnea     hx of "had surgery for"  . Automatic implantable cardioverter-defibrillator in situ   . Depression   . Numbness and tingling     Hx; 33fleft foot  . PAD (peripheral artery disease)     Severe by PV angiogram 09/2011  . Renal artery stenosis     s/p stenting 2009  . PAD (peripheral artery disease)     s/p multiple LLE bypass grafts; left mid-distal SCA occlusion by 08/2012 duplex  . Hypertension     takes Carvedilol and Losartan daily  . Constipation     takes Miralax daily as needed and Colace daily   . Type II diabetes mellitus     takes Novolog 70/30  . CHF (congestive heart failure)     takes Lasix  daily  . History of MRSA infection   . Insomnia     TAKES TRAZODONE NIGHTLY    Past Surgical History  Procedure Laterality Date  . Turp vaporization    . Cardiac defibrillator placement  12/26/09    pacemaker combo  . Cervical epidural injection  2013  . Femoral-tibial bypass graft  09/25/2011    Procedure: BYPASS GRAFT FEMORAL-TIBIAL ARTERY;  Surgeon: JMal Misty MD;  [facility]  Service: Vascular;  Laterality: Left;  Left Femoral - Anterior Tibial Bypass;  saphenous vein graft from left leg  . Intraoperative arteriogram  09/25/2011    Procedure: INTRA OPERATIVE ARTERIOGRAM;  Surgeon: JMal Misty MD;  [place] [facility]  Service: Vascular;  Laterality: Left;  . Femoral-tibial bypass graft  02/07/2012    Procedure: BYPASS GRAFT FEMORAL-TIBIAL ARTERY;  Surgeon: TRosetta Posner MD;  [place] [facility]  Service: Vascular;  Laterality: Left;  Thrombectomy Left Femoral - Tibial Bypass Graft  . Embolectomy  02/07/2012    Procedure: EMBOLECTOMY;  Surgeon: TRosetta Posner MD;  [place] [facility]  Service: Vascular;  Laterality: Left;  . Femoral-tibial bypass graft  04/03/2012    Procedure: BYPASS GRAFT FEMORAL-TIBIAL ARTERY;  Surgeon: Mal Misty, MD;  Location: [place];  Service: Vascular;  Laterality: Left;  Redo  . Insert / replace / remove pacemaker  2007  . Coronary angioplasty with stent placement  ~ 2000  . Coronary angioplasty    . Uvulopalatopharyngoplasty (uppp)/tonsillectomy/septoplasty  06/30/2003    Archie Endo 06/30/2003 (07/10/2012)  . Shoulder open rotator cuff repair Right 2001    repair of lacerated right/notes 10/11/1999  (07/10/2012)  . Foot surgery Right 03/20/2001    "have plates and screws in; didn't break it" (07/10/2012)  . Carpal tunnel release Right 2002    Archie Endo 03/20/2001 (07/10/2012)  . Biceps tendon repair Right 2001    Archie Endo 03/20/2001 (07/10/2012)  . Renal artery stent  2009  . Femoral-popliteal bypass graft Left 09/16/2012    Procedure: LEFT FEMORAL-POPLITEAL BYPASS GRAFT  WITH GORTEX Propaten Graft 6x80 Thin Wall and Left lower leg Angiogram;  Surgeon: Mal Misty, MD;  Location: [place];  Service: Vascular;  Laterality: Left;  . Colonoscopy      Hx; of  . Tonsillectomy    . Adenoidectomy      Hx; of   . Femoral-tibial bypass graft Left 09/30/2012    Procedure: REDO LEFT FEMORAL-ANTERIOR TIBIAL ARTERY BYPASS USING COMPOSITE CEPHALIC AND BASILIC VEIN GRAFT FROM LEFT ARM;  Surgeon: Mal Misty, MD;  Location: [place];  Service: Vascular;  Laterality: Left;  . I&d extremity Left 10/21/2012    Procedure: EXPLORATION AND DEBRIDEMENT OF LEFT GROIN WOUND;  Surgeon: Mal Misty, MD;  Location: [place];  Service: Vascular;  Laterality: Left;  . Patch angioplasty Left 10/21/2012    Procedure: PATCH ANGIOPLASTY;  Surgeon: Mal Misty, MD;  Location: [place];  Service: Vascular;  Laterality: Left;  . Groin debridement Left 11/12/2012    Procedure: CLOSURE INGUINAL WOUND;  Surgeon: Mal Misty, MD;  Location: [place];  Service: Vascular;  Laterality: Left;  . Embolectomy Left 12/16/2012    Procedure: THROMBECTOMY  LEFT LEG BYPASS;  Surgeon: Elam Dutch, MD;  Location: [place];  Service: Vascular;  Laterality: Left;  . Leg amputation above knee Left 02/04/2013    DR LAWSON  . Amputation Left 02/04/2013    Procedure: AMPUTATION ABOVE KNEE-LEFT;  Surgeon: Mal Misty, MD;  Location: [place];  Service: Vascular;  Laterality: Left;  . Removal of graft Left 04/16/2013    Procedure: I & D LEFT AKA WOUND, POSSIBLE REMOVAL OF INFECTED GORTEX GRAFT;  Surgeon: Mal Misty, MD;  Location: [place];  Service: Vascular;  Laterality: Left;  . Abdominal aortagram N/A 09/11/2011    Procedure: ABDOMINAL Maxcine Ham;  Surgeon: Wellington Hampshire, MD;  Location: [facility] CATH LAB;  Service: Cardiovascular;  Laterality: N/A;  . Lower extremity angiogram Left 12/25/2011    Procedure: LOWER EXTREMITY ANGIOGRAM;  Surgeon: Serafina Mitchell, MD;  Location: [facility] CATH LAB;  Service: Cardiovascular;   Laterality: Left;  lt leg angio co2  . Abdominal angiogram  12/25/2011    Procedure: ABDOMINAL ANGIOGRAM;  Surgeon: Serafina Mitchell, MD;  Location[facility] C[facility] LAB;  Service: Cardiovascular;;  . Lower extremity angiogram N/A 07/07/2012    Procedure: LOWER EXTREMITY ANGIOGRAM;  Surgeon: Conrad Chadbourn, MD;  Location: [facility] CATH LAB;  Service: Cardiovascular;  Laterality: N/A;  . Lower extremity angiogram N/A 09/15/2012    Procedure: LOWER EXTREMITY ANGIOGRAM;  Surgeon: Serafina Mitchell, MD;  Location: [facility] CATH LAB;  Service: Cardiovascular;  Laterality: N/A;  . Upper extremity angiogram  09/15/2012  Procedure: UPPER EXTREMITY ANGIOGRAM;  Surgeon: Serafina Mitchell, MD;  Location: [facility] CATH LAB;  Service: Cardiovascular;;  . Biv icd genertaor change out N/A 11/09/2013    Procedure: BIV ICD GENERTAOR CHANGE OUT;  Surgeon: Coralyn Mark, MD;  Location: [facility] CATH LAB;  Service: Cardiovascular;  Laterality: N/A;    There were no vitals filed for this visit.  Visit Diagnosis:  Abnormality of gait  Impaired functional mobility and activity tolerance  Weakness generalized  Activity intolerance      Subjective Assessment - 08/22/14 1022    Subjective No new complaints. No falls to reports. Still having issues with getting proper suction/fit of prosthesis, fell off this am. Thinks he has it on securley now.   Currently in Pain? Yes   Pain Score 5    Pain Location Leg   Pain Orientation Left   Pain Descriptors / Indicators Sore;Sharp   Pain Type Phantom pain   Pain Onset More than a month ago   Pain Frequency Constant   Aggravating Factors  prosthetic wear   Pain Relieving Factors medicine, rest, removing prosthesis          [facility] Adult PT Treatment/Exercise - 08/22/14 1027    Transfers   Sit to Stand 5: Supervision;With upper extremity assist;With armrests;From chair/3-in-1   Sit to Stand Details (indicate cue type and reason) cues on hand and prosthetic placement   Stand to Sit 5: Supervision;With  upper extremity assist;With armrests;To chair/3-in-1   Stand to Sit Details cues to turn and back all the way to the chair/surface before sitting down   Ambulation/Gait   Ambulation/Gait Yes   Ambulation/Gait Assistance 5: Supervision;4: Min guard   Ambulation/Gait Assistance Details cues on rollator position, posture and equal step length. cues to keep prothetic out, not adducted under him with gait.    Ambulation Distance (Feet) 60 Feet  x 2 reps; 120 x 1   Assistive device Prosthesis;Rollator   Ambulation Surface Level;Indoor   Prosthetics   Prosthetic Care Comments  Saw prosthetist and had pads added to socket and faub fixed.  Did not get any socks from prosthetist.                Current prosthetic wear tolerance (days/week)  daily   Current prosthetic wear tolerance (#hours/day)  >/= 90% of awake hours   Residual limb condition  heat rash yesterday after removing prosthesis, gone this am when woke up.   Education Provided Residual limb care;Proper wear schedule/adjustment  drying as needed   Person(s) Educated Patient   Education Method Explanation   Education Method Verbalized understanding   Donning Prosthesis Supervision   Doffing Prosthesis Modified independent (device/increased time)     Pt needed supervision and cues for proper donning of prosthesis and to ensure all air out in session today.         PT Short Term Goals - 08/08/14 0936    PT SHORT TERM GOAL #1   Title donnes prosthesis with new suspension correctly independently. (Target Date: 06/17/14)   Status Achieved   PT SHORT TERM GOAL #2   Title demonstrates understanding of initial HEP. (Target Date: 06/17/14)   Status Achieved   PT SHORT TERM GOAL #3   Title wears prosthesis >70% of awake hours >/= 5 days /wk. (Target Date: 06/17/14)   Status Achieved   PT SHORT TERM GOAL #4   Title ambulates 100' with rolling walker & prosthesis with cues only for deviations. (Target Date: 06/17/14)   Status  Achieved   PT  SHORT TERM GOAL #5   Title stands 30sec without UE support with supervision. (Target Date: 06/17/14)   Baseline MET 07/14/14   Status Achieved   PT SHORT TERM GOAL #6   Title pt ambulates 150' with rollater with supervision. (Target Date 08/11/14)   Status New   PT SHORT TERM GOAL #7   Title pt negotiates ramps and curb with rollator with supervision. (Target Date 08/11/14)   Status New   PT SHORT TERM GOAL #8   Title pt able to transfer safely from [facility] to chair with armrests with supervision. (Target Date 08/11/14)   Status New           PT Long Term Goals - 07/14/14 1648    PT LONG TERM GOAL #1   Title wears prosthesis >80% of awake hours daily. (Target Date: 07/15/14)   Baseline MET 07/14/14   Time 8   Period Weeks   Status Achieved   PT LONG TERM GOAL #2   Title verbalize understanding of ongoing fitness plan. (NEW Target Date: 09/09/14)   Baseline MET 07/14/14 PT is continuing to update   Time 8   Period Weeks   Status On-going   PT LONG TERM GOAL #3   Title ambulates 200' with rolling walker & prosthesis modified independent. (NEW Target Date: 09/09/14)   Baseline 07/14/14 progressing. pt able to ambulate for 100' with rollator with supervision   Time 8   Period Weeks   Status On-going   PT LONG TERM GOAL #4   Title Berg Balance >18/56 (Target Date: 07/15/14)   Baseline MET 07/14/14. BERG 20/56   Time 8   Period Weeks   Status Achieved   PT LONG TERM GOAL #5   Title negotiate ramp, curb & stairs (1 rail) with rolling walker & prosthesis modified independent. (New Target Date: 09/09/14)   Baseline 07/14/14 progressing. pt able to negotiate ramp and curb with rollator with minA and constant verbal cues   Time 8   Period Weeks   Status On-going   PT LONG TERM GOAL #6   Title BERG balance > /= 24/56   Time 8   Period Weeks   Status New               Plan - 08/22/14 1026    Clinical Impression Statement Pt limited by limb pain today due to added pads in socket. Call  placed to prosthetist today to get pt an appointment to have smaller pads in socket and to get socks for this syle of socket/liner. Prosthetist office to call pt back with appointment. Pt making slow progress toward goals.                         Pt will benefit from skilled therapeutic intervention in order to improve on the following deficits Abnormal gait;Cardiopulmonary status limiting activity;Decreased activity tolerance;Decreased balance;Decreased endurance;Decreased knowledge of use of DME;Decreased mobility;Decreased range of motion;Decreased strength;Pain;Other (comment)   Rehab Potential Good   PT Frequency 2x / week   PT Duration 8 weeks   PT Treatment/Interventions ADLs/Self Care Home Management;DME Instruction;Gait training;Stair training;Functional mobility training;Therapeutic activities;Therapeutic exercise;Balance training;Neuromuscular re-education;Patient/family education;Other (comment)   PT Next Visit Plan continue toward LTG's   Consulted and Agree with Plan of Care Patient        Problem List Patient Active Problem List   Diagnosis Date Noted  . Phantom limb syndrome with pain 08/03/2014  . Diabetic  polyneuropathy associated with diabetes mellitus due to underlying condition 07/12/2014  . CVA (cerebral infarction) 06/15/2014  . Expressive aphasia 06/15/2014  . CKD (chronic kidney disease) stage 4, GFR 15-29 ml/min 06/15/2014  . DM (diabetes mellitus), type 2, uncontrolled, with renal complications 19/59/7471  . Unilateral AKA 02/09/2013  . S/P ICD (internal cardiac defibrillator) procedure 05/18/2012  . PAD (peripheral artery disease) 09/04/2011  . CAD (coronary artery disease)   . Chronic systolic congestive heart failure 07/06/2010  . Gout 09/18/2007  . Essential hypertension 01/23/2007  . Atrial fibrillation 01/23/2007  . BPH (benign prostatic hyperplasia) 01/23/2007    Willow Ora 08/22/2014, 1:57 PM  Willow Ora, PTA, [facility] [address] [place], [place] [address] [phone] 08/22/2014, 1:57 PM

## 2014-08-23 ENCOUNTER — Telehealth: Payer: Self-pay | Admitting: Internal Medicine

## 2014-08-23 NOTE — Telephone Encounter (Signed)
Spoke with dental office- clearance to use ultrasonic cleaner. Advised to keep cord for cleaner off of patient's chest and to monitor for symptoms although symptoms would be unsuspected. Verbalized understanding.

## 2014-08-23 NOTE — Telephone Encounter (Signed)
New problem    Need clearance for dental procedure

## 2014-08-24 ENCOUNTER — Encounter: Payer: Self-pay | Admitting: Physical Therapy

## 2014-08-24 ENCOUNTER — Ambulatory Visit: Payer: Commercial Managed Care - HMO | Admitting: Physical Therapy

## 2014-08-24 DIAGNOSIS — Z7409 Other reduced mobility: Secondary | ICD-10-CM

## 2014-08-24 DIAGNOSIS — R2689 Other abnormalities of gait and mobility: Secondary | ICD-10-CM

## 2014-08-24 DIAGNOSIS — R531 Weakness: Secondary | ICD-10-CM

## 2014-08-24 DIAGNOSIS — R269 Unspecified abnormalities of gait and mobility: Secondary | ICD-10-CM

## 2014-08-24 DIAGNOSIS — Z89612 Acquired absence of left leg above knee: Secondary | ICD-10-CM

## 2014-08-24 NOTE — Therapy (Signed)
[facility] [address] [place], [place], [address] Phone: [phone]   Fax:  [phone]  Physical Therapy Treatment  Patient Details  Name: [name] MRN: [id number] Date of Birth: [birth year] Referring Provider:  Laurey Morale, MD  Encounter Date: 08/24/2014      PT End of Session - 08/24/14 1015    Visit Number 23   Number of Visits 30   Date for PT Re-Evaluation 09/09/14   PT Start Time 1016   PT Stop Time 1100   PT Time Calculation (min) 44 min   Equipment Utilized During Treatment Gait belt   Activity Tolerance Patient limited by fatigue   Behavior During Therapy [facility] for tasks assessed/performed      Past Medical History  Diagnosis Date  . Gout   . Benign prostatic hypertrophy     takes Proscar daily  . Atrial fibrillation     takes Warfarin daily  . Chronic systolic dysfunction of left ventricle     a. mixed ischemic and nonischemic CM,  EF 35%. b. s/p AICD implantation.  . ED (erectile dysfunction)   . Arthritis   . Pacemaker     medtronic  . CAD (coronary artery disease)     a. s/p mid LAD stenting with DES 2008 Dr. Daneen Schick  . ICD (implantable cardiac defibrillator) in place     medtronic, Dr. Rayann Heman  . ICD (implantable cardiac defibrillator) in place   . Sleep apnea     hx of "had surgery for"  . Automatic implantable cardioverter-defibrillator in situ   . Depression   . Numbness and tingling     Hx; 90fleft foot  . PAD (peripheral artery disease)     Severe by PV angiogram 09/2011  . Renal artery stenosis     s/p stenting 2009  . PAD (peripheral artery disease)     s/p multiple LLE bypass grafts; left mid-distal SCA occlusion by 08/2012 duplex  . Hypertension     takes Carvedilol and Losartan daily  . Constipation     takes Miralax daily as needed and Colace daily   . Type II diabetes mellitus     takes Novolog 70/30  . CHF (congestive heart failure)     takes Lasix  daily  . History of MRSA infection   . Insomnia     TAKES TRAZODONE NIGHTLY    Past Surgical History  Procedure Laterality Date  . Turp vaporization    . Cardiac defibrillator placement  12/26/09    pacemaker combo  . Cervical epidural injection  2013  . Femoral-tibial bypass graft  09/25/2011    Procedure: BYPASS GRAFT FEMORAL-TIBIAL ARTERY;  Surgeon: JMal Misty MD;  [facility]  Service: Vascular;  Laterality: Left;  Left Femoral - Anterior Tibial Bypass;  saphenous vein graft from left leg  . Intraoperative arteriogram  09/25/2011    Procedure: INTRA OPERATIVE ARTERIOGRAM;  Surgeon: JMal Misty MD;  [place] [facility]  Service: Vascular;  Laterality: Left;  . Femoral-tibial bypass graft  02/07/2012    Procedure: BYPASS GRAFT FEMORAL-TIBIAL ARTERY;  Surgeon: TRosetta Posner MD;  [place] [facility]  Service: Vascular;  Laterality: Left;  Thrombectomy Left Femoral - Tibial Bypass Graft  . Embolectomy  02/07/2012    Procedure: EMBOLECTOMY;  Surgeon: TRosetta Posner MD;  [place] [facility]  Service: Vascular;  Laterality: Left;  . Femoral-tibial bypass graft  04/03/2012    Procedure: BYPASS GRAFT FEMORAL-TIBIAL ARTERY;  Surgeon: Mal Misty, MD;  Location: [facility];  Service: Vascular;  Laterality: Left;  Redo  . Insert / replace / remove pacemaker  2007  . Coronary angioplasty with stent placement  ~ 2000  . Coronary angioplasty    . Uvulopalatopharyngoplasty (uppp)/tonsillectomy/septoplasty  06/30/2003    Archie Endo 06/30/2003 (07/10/2012)  . Shoulder open rotator cuff repair Right 2001    repair of lacerated right/notes 10/11/1999  (07/10/2012)  . Foot surgery Right 03/20/2001    "have plates and screws in; didn't break it" (07/10/2012)  . Carpal tunnel release Right 2002    Archie Endo 03/20/2001 (07/10/2012)  . Biceps tendon repair Right 2001    Archie Endo 03/20/2001 (07/10/2012)  . Renal artery stent  2009  . Femoral-popliteal bypass graft Left 09/16/2012    Procedure: LEFT FEMORAL-POPLITEAL BYPASS GRAFT  WITH GORTEX Propaten Graft 6x80 Thin Wall and Left lower leg Angiogram;  Surgeon: Mal Misty, MD;  Location: [facility];  Service: Vascular;  Laterality: Left;  . Colonoscopy      Hx; of  . Tonsillectomy    . Adenoidectomy      Hx; of   . Femoral-tibial bypass graft Left 09/30/2012    Procedure: REDO LEFT FEMORAL-ANTERIOR TIBIAL ARTERY BYPASS USING COMPOSITE CEPHALIC AND BASILIC VEIN GRAFT FROM LEFT ARM;  Surgeon: Mal Misty, MD;  Location: [facility];  Service: Vascular;  Laterality: Left;  . I&d extremity Left 10/21/2012    Procedure: EXPLORATION AND DEBRIDEMENT OF LEFT GROIN WOUND;  Surgeon: Mal Misty, MD;  Location: [facility];  Service: Vascular;  Laterality: Left;  . Patch angioplasty Left 10/21/2012    Procedure: PATCH ANGIOPLASTY;  Surgeon: Mal Misty, MD;  Location: [facility];  Service: Vascular;  Laterality: Left;  . Groin debridement Left 11/12/2012    Procedure: CLOSURE INGUINAL WOUND;  Surgeon: Mal Misty, MD;  Location: [facility];  Service: Vascular;  Laterality: Left;  . Embolectomy Left 12/16/2012    Procedure: THROMBECTOMY  LEFT LEG BYPASS;  Surgeon: Elam Dutch, MD;  Location: [facility];  Service: Vascular;  Laterality: Left;  . Leg amputation above knee Left 02/04/2013    DR LAWSON  . Amputation Left 02/04/2013    Procedure: AMPUTATION ABOVE KNEE-LEFT;  Surgeon: Mal Misty, MD;  Location: [facility] [place];  Service: Vascular;  Laterality: Left;  . Removal of graft Left 04/16/2013    Procedure: I & D LEFT AKA WOUND, POSSIBLE REMOVAL OF INFECTED GORTEX GRAFT;  Surgeon: Mal Misty, MD;  Location: [facility];  Service: Vascular;  Laterality: Left;  . Abdominal aortagram N/A 09/11/2011    Procedure: ABDOMINAL Maxcine Ham;  Surgeon: Wellington Hampshire, MD;  Location: [facility] CATH LAB;  Service: Cardiovascular;  Laterality: N/A;  . Lower extremity angiogram Left 12/25/2011    Procedure: LOWER EXTREMITY ANGIOGRAM;  Surgeon: Serafina Mitchell, MD;  Location: [facility] CATH LAB;  Service: Cardiovascular;   Laterality: Left;  lt leg angio co2  . Abdominal angiogram  12/25/2011    Procedure: ABDOMINAL ANGIOGRAM;  Surgeon: Serafina Mitchell, MD;  Location: [facility] CATH LAB;  Service: Cardiovascular;;  . Lower extremity angiogram N/A 07/07/2012    Procedure: LOWER EXTREMITY ANGIOGRAM;  Surgeon: Conrad Stoddard, MD;  Location: [facility] CATH LAB;  Service: Cardiovascular;  Laterality: N/A;  . Lower extremity angiogram N/A 09/15/2012    Procedure: LOWER EXTREMITY ANGIOGRAM;  Surgeon: Serafina Mitchell, MD;  Location: [facility] CATH LAB;  Service: Cardiovascular;  Laterality: N/A;  . Upper extremity angiogram  09/15/2012  Procedure: UPPER EXTREMITY ANGIOGRAM;  Surgeon: Serafina Mitchell, MD;  Location: [facility] CATH LAB;  Service: Cardiovascular;;  . Biv icd genertaor change out N/A 11/09/2013    Procedure: BIV ICD GENERTAOR CHANGE OUT;  Surgeon: Coralyn Mark, MD;  Location[facility] CATH LAB;  Service: Cardiovascular;  Laterality: N/A;    There were no vitals filed for this visit.  Visit Diagnosis:  Abnormality of gait  Impaired functional mobility and activity tolerance  Weakness generalized  Balance problems  Status post above knee amputation of left lower extremity      Subjective Assessment - 08/24/14 1015    Subjective He saw prosthetist who reports he did not need socks. About hour later he lost suction suspension. The same thing happened this morning. But he has not fallen   Currently in Pain? Yes   Pain Score 10-Worst pain ever   Pain Location Leg   Pain Orientation Left   Pain Type Phantom pain   Aggravating Factors  unknown same with or without prosthesis,    Pain Relieving Factors uncertain if meds help                         [facility] Adult PT Treatment/Exercise - 08/24/14 1015    Transfers   Sit to Stand 5: Supervision;With upper extremity assist;With armrests;From chair/3-in-1   Sit to Stand Details (indicate cue type and reason) cues on engaging prosthetic knee sooner   Stand to Sit 5:  Supervision;With upper extremity assist;With armrests;To chair/3-in-1   Stand to Sit Details cues to position self prior to sitting   Ambulation/Gait   Ambulation/Gait Yes   Ambulation/Gait Assistance 5: Supervision;4: Min guard   Ambulation/Gait Assistance Details demo, verbal instruction on wt shift forward over prosthesis in stance prior to stepping with right LE. Pt able to partially step right foot with toes passing prosthetic toes unlike his typical step-to that right toes are short 2-4" of prosthetic toes for step length.  Change in right step length decreased 10' from 8 steps to 7   Ambulation Distance (Feet) 125 Feet  x 2 reps, 40' X 2   Assistive device Prosthesis;Rollator   Gait Pattern Decreased step length - right;Decreased stance time - left;Decreased stride length;Antalgic;Step-through pattern;Trunk flexed   Ambulation Surface Indoor;Level   Ramp 5: Supervision  rollator & prosthesis   Ramp Details (indicate cue type and reason) cues on posture, position within rollator, wt shift over prosthesis in stance   Curb 5: Supervision  rollator & prosthesis   Curb Details (indicate cue type and reason) cues on rollator control   Prosthetics   Prosthetic Care Comments  --   Current prosthetic wear tolerance (days/week)  daily   Current prosthetic wear tolerance (#hours/day)  >/= 90% of awake hours   Residual limb condition  reports no issues   Education Provided Residual limb care   Person(s) Educated Patient   Education Method Explanation   Education Method Verbalized understanding   Donning Prosthesis Modified independent (device/increased time)   Doffing Prosthesis Modified independent (device/increased time)                  PT Short Term Goals - 08/08/14 0936    PT SHORT TERM GOAL #1   Title donnes prosthesis with new suspension correctly independently. (Target Date: 06/17/14)   Status Achieved   PT SHORT TERM GOAL #2   Title demonstrates understanding of  initial HEP. (Target Date: 06/17/14)   Status Achieved  PT SHORT TERM GOAL #3   Title wears prosthesis >70% of awake hours >/= 5 days /wk. (Target Date: 06/17/14)   Status Achieved   PT SHORT TERM GOAL #4   Title ambulates 100' with rolling walker & prosthesis with cues only for deviations. (Target Date: 06/17/14)   Status Achieved   PT SHORT TERM GOAL #5   Title stands 30sec without UE support with supervision. (Target Date: 06/17/14)   Baseline MET 07/14/14   Status Achieved   PT SHORT TERM GOAL #6   Title pt ambulates 150' with rollater with supervision. (Target Date 08/11/14)   Status New   PT SHORT TERM GOAL #7   Title pt negotiates ramps and curb with rollator with supervision. (Target Date 08/11/14)   Status New   PT SHORT TERM GOAL #8   Title pt able to transfer safely from [facility] to chair with armrests with supervision. (Target Date 08/11/14)   Status New           PT Long Term Goals - 07/14/14 1648    PT LONG TERM GOAL #1   Title wears prosthesis >80% of awake hours daily. (Target Date: 07/15/14)   Baseline MET 07/14/14   Time 8   Period Weeks   Status Achieved   PT LONG TERM GOAL #2   Title verbalize understanding of ongoing fitness plan. (NEW Target Date: 09/09/14)   Baseline MET 07/14/14 PT is continuing to update   Time 8   Period Weeks   Status On-going   PT LONG TERM GOAL #3   Title ambulates 200' with rolling walker & prosthesis modified independent. (NEW Target Date: 09/09/14)   Baseline 07/14/14 progressing. pt able to ambulate for 100' with rollator with supervision   Time 8   Period Weeks   Status On-going   PT LONG TERM GOAL #4   Title Berg Balance >18/56 (Target Date: 07/15/14)   Baseline MET 07/14/14. BERG 20/56   Time 8   Period Weeks   Status Achieved   PT LONG TERM GOAL #5   Title negotiate ramp, curb & stairs (1 rail) with rolling walker & prosthesis modified independent. (New Target Date: 09/09/14)   Baseline 07/14/14 progressing. pt able to negotiate ramp and  curb with rollator with minA and constant verbal cues   Time 8   Period Weeks   Status On-going   PT LONG TERM GOAL #6   Title BERG balance > /= 24/56   Time 8   Period Weeks   Status New               Plan - 08/24/14 1015    Clinical Impression Statement Patient was able to improve step length and effeciency of gait with verbal cues.    Pt will benefit from skilled therapeutic intervention in order to improve on the following deficits Abnormal gait;Cardiopulmonary status limiting activity;Decreased activity tolerance;Decreased balance;Decreased endurance;Decreased knowledge of use of DME;Decreased mobility;Decreased range of motion;Decreased strength;Pain;Other (comment)   Rehab Potential Good   PT Frequency 2x / week   PT Duration 8 weeks   PT Treatment/Interventions ADLs/Self Care Home Management;DME Instruction;Gait training;Stair training;Functional mobility training;Therapeutic activities;Therapeutic exercise;Balance training;Neuromuscular re-education;Patient/family education;Other (comment)   PT Next Visit Plan continue toward LTG's   Consulted and Agree with Plan of Care Patient        Problem List Patient Active Problem List   Diagnosis Date Noted  . Phantom limb syndrome with pain 08/03/2014  . Diabetic polyneuropathy associated with diabetes mellitus due  to underlying condition 07/12/2014  . CVA (cerebral infarction) 06/15/2014  . Expressive aphasia 06/15/2014  . CKD (chronic kidney disease) stage 4, GFR 15-29 ml/min 06/15/2014  . DM (diabetes mellitus), type 2, uncontrolled, with renal complications 90/21/1155  . Unilateral AKA 02/09/2013  . S/P ICD (internal cardiac defibrillator) procedure 05/18/2012  . PAD (peripheral artery disease) 09/04/2011  . CAD (coronary artery disease)   . Chronic systolic congestive heart failure 07/06/2010  . Gout 09/18/2007  . Essential hypertension 01/23/2007  . Atrial fibrillation 01/23/2007  . BPH (benign prostatic  hyperplasia) 01/23/2007    Jamey Reas PT, DPT 08/24/2014, 10:08 PM  [facility] [address] [place], [place], [address] Phone: [phone]   Fax:  [phone]

## 2014-08-29 ENCOUNTER — Other Ambulatory Visit: Payer: Self-pay

## 2014-08-29 ENCOUNTER — Encounter: Payer: Self-pay | Admitting: Physical Therapy

## 2014-08-29 ENCOUNTER — Ambulatory Visit: Payer: Commercial Managed Care - HMO | Admitting: Physical Therapy

## 2014-08-29 DIAGNOSIS — Z89612 Acquired absence of left leg above knee: Secondary | ICD-10-CM

## 2014-08-29 DIAGNOSIS — R269 Unspecified abnormalities of gait and mobility: Secondary | ICD-10-CM

## 2014-08-29 DIAGNOSIS — Z7409 Other reduced mobility: Secondary | ICD-10-CM

## 2014-08-29 DIAGNOSIS — R531 Weakness: Secondary | ICD-10-CM

## 2014-08-29 DIAGNOSIS — R2689 Other abnormalities of gait and mobility: Secondary | ICD-10-CM

## 2014-08-29 DIAGNOSIS — R6889 Other general symptoms and signs: Secondary | ICD-10-CM

## 2014-08-30 NOTE — Therapy (Signed)
[name] [address] [place] [name], [place], [address] Phone: [phone]   Fax:  [phone]  Physical Therapy Treatment  Patient Details  Name: [name] MRN: [id number] Date of Birth: 12/25/1934 Referring Provider:  Laurey Morale, MD  Encounter Date: 08/29/2014      PT End of Session - 08/29/14 1109    Visit Number 24   Number of Visits 30   Date for PT Re-Evaluation 09/09/14   PT Start Time 1103   PT Stop Time 1143   PT Time Calculation (min) 40 min   Equipment Utilized During Treatment Gait belt   Activity Tolerance Patient limited by fatigue   Behavior During Therapy [facility] for tasks assessed/performed      Past Medical History  Diagnosis Date  . Gout   . Benign prostatic hypertrophy     takes Proscar daily  . Atrial fibrillation     takes Warfarin daily  . Chronic systolic dysfunction of left ventricle     a. mixed ischemic and nonischemic CM,  EF 35%. b. s/p AICD implantation.  . ED (erectile dysfunction)   . Arthritis   . Pacemaker     medtronic  . CAD (coronary artery disease)     a. s/p mid LAD stenting with DES 2008 Dr. Daneen Schick  . ICD (implantable cardiac defibrillator) in place     medtronic, Dr. Rayann Heman  . ICD (implantable cardiac defibrillator) in place   . Sleep apnea     hx of "had surgery for"  . Automatic implantable cardioverter-defibrillator in situ   . Depression   . Numbness and tingling     Hx; 1f left foot  . PAD (peripheral artery disease)     Severe by PV angiogram 09/2011  . Renal artery stenosis     s/p stenting 2009  . PAD (peripheral artery disease)     s/p multiple LLE bypass grafts; left mid-distal SCA occlusion by 08/2012 duplex  . Hypertension     takes Carvedilol and Losartan daily  . Constipation     takes Miralax daily as needed and Colace daily   . Type II diabetes mellitus     takes Novolog 70/30  . CHF (congestive heart failure)     takes Lasix  daily  . History of MRSA infection   . Insomnia     TAKES TRAZODONE NIGHTLY    Past Surgical History  Procedure Laterality Date  . Turp vaporization    . Cardiac defibrillator placement  12/26/09    pacemaker combo  . Cervical epidural injection  2013  . Femoral-tibial bypass graft  09/25/2011    Procedure: BYPASS GRAFT FEMORAL-TIBIAL ARTERY;  Surgeon: Mal Misty, MD;  Location: [facility];  Service: Vascular;  Laterality: Left;  Left Femoral - Anterior Tibial Bypass;  saphenous vein graft from left leg  . Intraoperative arteriogram  09/25/2011    Procedure: INTRA OPERATIVE ARTERIOGRAM;  Surgeon: Mal Misty, MD;  Location: [place];  Service: Vascular;  Laterality: Left;  . Femoral-tibial bypass graft  02/07/2012    Procedure: BYPASS GRAFT FEMORAL-TIBIAL ARTERY;  Surgeon: Rosetta Posner, MD;  Location: [place];  Service: Vascular;  Laterality: Left;  Thrombectomy Left Femoral - Tibial Bypass Graft  . Embolectomy  02/07/2012    Procedure: EMBOLECTOMY;  Surgeon: Rosetta Posner, MD;  Location: [place];  Service: Vascular;  Laterality: Left;  . Femoral-tibial bypass graft  04/03/2012    Procedure: BYPASS GRAFT FEMORAL-TIBIAL ARTERY;  Surgeon: Mal Misty, MD;  Location: [place];  Service: Vascular;  Laterality: Left;  Redo  . Insert / replace / remove pacemaker  2007  . Coronary angioplasty with stent placement  ~ 2000  . Coronary angioplasty    . Uvulopalatopharyngoplasty (uppp)/tonsillectomy/septoplasty  06/30/2003    Archie Endo 06/30/2003 (07/10/2012)  . Shoulder open rotator cuff repair Right 2001    repair of lacerated right/notes 10/11/1999  (07/10/2012)  . Foot surgery Right 03/20/2001    "have plates and screws in; didn't break it" (07/10/2012)  . Carpal tunnel release Right 2002    Archie Endo 03/20/2001 (07/10/2012)  . Biceps tendon repair Right 2001    Archie Endo 03/20/2001 (07/10/2012)  . Renal artery stent  2009  . Femoral-popliteal bypass graft Left 09/16/2012    Procedure: LEFT FEMORAL-POPLITEAL BYPASS GRAFT  WITH GORTEX Propaten Graft 6x80 Thin Wall and Left lower leg Angiogram;  Surgeon: Mal Misty, MD;  Location: [place];  Service: Vascular;  Laterality: Left;  . Colonoscopy      Hx; of  . Tonsillectomy    . Adenoidectomy      Hx; of   . Femoral-tibial bypass graft Left 09/30/2012    Procedure: REDO LEFT FEMORAL-ANTERIOR TIBIAL ARTERY BYPASS USING COMPOSITE CEPHALIC AND BASILIC VEIN GRAFT FROM LEFT ARM;  Surgeon: Mal Misty, MD;  Location: [place];  Service: Vascular;  Laterality: Left;  . I&d extremity Left 10/21/2012    Procedure: EXPLORATION AND DEBRIDEMENT OF LEFT GROIN WOUND;  Surgeon: Mal Misty, MD;  Location: [place];  Service: Vascular;  Laterality: Left;  . Patch angioplasty Left 10/21/2012    Procedure: PATCH ANGIOPLASTY;  Surgeon: Mal Misty, MD;  Location: [place];  Service: Vascular;  Laterality: Left;  . Groin debridement Left 11/12/2012    Procedure: CLOSURE INGUINAL WOUND;  Surgeon: Mal Misty, MD;  Location: [place];  Service: Vascular;  Laterality: Left;  . Embolectomy Left 12/16/2012    Procedure: THROMBECTOMY  LEFT LEG BYPASS;  Surgeon: Elam Dutch, MD;  Location: [place];  Service: Vascular;  Laterality: Left;  . Leg amputation above knee Left 02/04/2013    DR LAWSON  . Amputation Left 02/04/2013    Procedure: AMPUTATION ABOVE KNEE-LEFT;  Surgeon: Mal Misty, MD;  Location: [place];  Service: Vascular;  Laterality: Left;  . Removal of graft Left 04/16/2013    Procedure: I & D LEFT AKA WOUND, POSSIBLE REMOVAL OF INFECTED GORTEX GRAFT;  Surgeon: Mal Misty, MD;  Location: [place];  Service: Vascular;  Laterality: Left;  . Abdominal aortagram N/A 09/11/2011    Procedure: ABDOMINAL Maxcine Ham;  Surgeon: Wellington Hampshire, MD;  Location: [facility] CATH LAB;  Service: Cardiovascular;  Laterality: N/A;  . Lower extremity angiogram Left 12/25/2011    Procedure: LOWER EXTREMITY ANGIOGRAM;  Surgeon: Serafina Mitchell, MD;  Location: [facility] CATH LAB;  Service: Cardiovascular;   Laterality: Left;  lt leg angio co2  . Abdominal angiogram  12/25/2011    Procedure: ABDOMINAL ANGIOGRAM;  Surgeon: Serafina Mitchell, MD;  Location: [facility] CATH LAB;  Service: Cardiovascular;;  . Lower extremity angiogram N/A 07/07/2012    Procedure: LOWER EXTREMITY ANGIOGRAM;  Surgeon: Conrad Chico, MD;  Location: [facility] CATH LAB;  Service: Cardiovascular;  Laterality: N/A;  . Lower extremity angiogram N/A 09/15/2012    Procedure: LOWER EXTREMITY ANGIOGRAM;  Surgeon: Serafina Mitchell, MD;  Location: [facility] CATH LAB;  Service: Cardiovascular;  Laterality: N/A;  . Upper extremity angiogram  09/15/2012  Procedure: UPPER EXTREMITY ANGIOGRAM;  Surgeon: Serafina Mitchell, MD;  Location: [facility] CATH LAB;  Service: Cardiovascular;;  . Biv icd genertaor change out N/A 11/09/2013    Procedure: BIV ICD GENERTAOR CHANGE OUT;  Surgeon: Coralyn Mark, MD;  Location: [facility] CATH LAB;  Service: Cardiovascular;  Laterality: N/A;    There were no vitals filed for this visit.  Visit Diagnosis:  Abnormality of gait  Balance problems  Impaired functional mobility and activity tolerance  Weakness generalized  Activity intolerance  Status post above knee amputation of left lower extremity      Subjective Assessment - 08/29/14 1106    Subjective No falls. Had prosthesis pop off 3 times yesterday and now has rash on outer thigh area and some welts as well on the distal end. Both clear up almost all the way at night and come back with liner wear. See's Psychologist, clinical (prosthetist) today.   Currently in Pain? Yes   Pain Score 7    Pain Location Leg   Pain Orientation Left   Pain Descriptors / Indicators Sharp;Sore   Pain Type Phantom pain   Pain Onset More than a month ago   Pain Frequency Constant   Aggravating Factors  liner/prosthetic wear, weight bearing   Pain Relieving Factors medicine, takes only when pain is unbearable (narcotic), take nerve pills as scheduled. removing prosthesis/liner          [facility] Adult PT  Treatment/Exercise - 08/29/14 1110    Transfers   Sit to Stand 5: Supervision;With upper extremity assist;With armrests;From chair/3-in-1   Sit to Stand Details (indicate cue type and reason) cues to scoot forward and engage prosthetic knee   Stand to Sit 5: Supervision;With upper extremity assist;With armrests;To chair/3-in-1   Stand to Sit Details cues to turn all the way around before sitting down    Ambulation/Gait   Ambulation/Gait Yes   Ambulation/Gait Assistance 5: Supervision;4: Min guard   Ambulation/Gait Assistance Details multimodal cues on posture, step length and walker position with gait   Ambulation Distance (Feet) 130 Feet  x1, 65 x 2 reps   Assistive device Prosthesis;Rollator   Gait Pattern Decreased step length - right;Decreased stance time - left;Decreased stride length;Antalgic;Step-through pattern;Trunk flexed   Ambulation Surface Level;Indoor   High Level Balance   High Level Balance Activities Side stepping;Backward walking   High Level Balance Comments in parallel bars: 4 laps each way with cues to decrease UE reliance during activity                   Prosthetics   Prosthetic Care Comments  use of baby oil/anti persperant for heat rash control.   Current prosthetic wear tolerance (days/week)  daily   Current prosthetic wear tolerance (#hours/day)  >/= 90% of awake hours   Residual limb condition  pt has rash and some skin welts that are appearing with liner wear, clearing for most part over night. See's prosthetist today.   Education Provided Residual limb care;Proper wear schedule/adjustment;Proper weight-bearing schedule/adjustment   Person(s) Educated Patient   Education Method Explanation;Verbal cues   Education Method Verbalized understanding   Donning Prosthesis Modified independent (device/increased time)   Doffing Prosthesis Modified independent (device/increased time)             PT Short Term Goals - 08/08/14 0936    PT SHORT TERM GOAL #1    Title donnes prosthesis with new suspension correctly independently. (Target Date: 06/17/14)   Status Achieved   PT SHORT TERM GOAL #  2   Title demonstrates understanding of initial HEP. (Target Date: 06/17/14)   Status Achieved   PT SHORT TERM GOAL #3   Title wears prosthesis >70% of awake hours >/= 5 days /wk. (Target Date: 06/17/14)   Status Achieved   PT SHORT TERM GOAL #4   Title ambulates 100' with rolling walker & prosthesis with cues only for deviations. (Target Date: 06/17/14)   Status Achieved   PT SHORT TERM GOAL #5   Title stands 30sec without UE support with supervision. (Target Date: 06/17/14)   Baseline MET 07/14/14   Status Achieved   PT SHORT TERM GOAL #6   Title pt ambulates 150' with rollater with supervision. (Target Date 08/11/14)   Status New   PT SHORT TERM GOAL #7   Title pt negotiates ramps and curb with rollator with supervision. (Target Date 08/11/14)   Status New   PT SHORT TERM GOAL #8   Title pt able to transfer safely from [facility] to chair with armrests with supervision. (Target Date 08/11/14)   Status New           PT Long Term Goals - 07/14/14 1648    PT LONG TERM GOAL #1   Title wears prosthesis >80% of awake hours daily. (Target Date: 07/15/14)   Baseline MET 07/14/14   Time 8   Period Weeks   Status Achieved   PT LONG TERM GOAL #2   Title verbalize understanding of ongoing fitness plan. (NEW Target Date: 09/09/14)   Baseline MET 07/14/14 PT is continuing to update   Time 8   Period Weeks   Status On-going   PT LONG TERM GOAL #3   Title ambulates 200' with rolling walker & prosthesis modified independent. (NEW Target Date: 09/09/14)   Baseline 07/14/14 progressing. pt able to ambulate for 100' with rollator with supervision   Time 8   Period Weeks   Status On-going   PT LONG TERM GOAL #4   Title Berg Balance >18/56 (Target Date: 07/15/14)   Baseline MET 07/14/14. BERG 20/56   Time 8   Period Weeks   Status Achieved   PT LONG TERM GOAL #5   Title  negotiate ramp, curb & stairs (1 rail) with rolling walker & prosthesis modified independent. (New Target Date: 09/09/14)   Baseline 07/14/14 progressing. pt able to negotiate ramp and curb with rollator with minA and constant verbal cues   Time 8   Period Weeks   Status On-going   PT LONG TERM GOAL #6   Title BERG balance > /= 24/56   Time 8   Period Weeks   Status New            Plan - 08/29/14 1109    Clinical Impression Statement Pt limited by fatigue and pain today per his reports. Reinforced need for activitiy outside of therapy for maximal benefit and functional ablility. Pt making slow, steady progress toward goals.   Pt will benefit from skilled therapeutic intervention in order to improve on the following deficits Abnormal gait;Cardiopulmonary status limiting activity;Decreased activity tolerance;Decreased balance;Decreased endurance;Decreased knowledge of use of DME;Decreased mobility;Decreased range of motion;Decreased strength;Pain;Other (comment)   Rehab Potential Good   PT Frequency 2x / week   PT Duration 8 weeks   PT Treatment/Interventions ADLs/Self Care Home Management;DME Instruction;Gait training;Stair training;Functional mobility training;Therapeutic activities;Therapeutic exercise;Balance training;Neuromuscular re-education;Patient/family education;Other (comment)   PT Next Visit Plan continue toward LTG's   Consulted and Agree with Plan of Care Patient  Problem List Patient Active Problem List   Diagnosis Date Noted  . Phantom limb syndrome with pain 08/03/2014  . Diabetic polyneuropathy associated with diabetes mellitus due to underlying condition 07/12/2014  . CVA (cerebral infarction) 06/15/2014  . Expressive aphasia 06/15/2014  . CKD (chronic kidney disease) stage 4, GFR 15-29 ml/min 06/15/2014  . DM (diabetes mellitus), type 2, uncontrolled, with renal complications 80/16/5537  . Unilateral AKA 02/09/2013  . S/P ICD (internal cardiac  defibrillator) procedure 05/18/2012  . PAD (peripheral artery disease) 09/04/2011  . CAD (coronary artery disease)   . Chronic systolic congestive heart failure 07/06/2010  . Gout 09/18/2007  . Essential hypertension 01/23/2007  . Atrial fibrillation 01/23/2007  . BPH (benign prostatic hyperplasia) 01/23/2007    Willow Ora 08/30/2014, 12:30 PM  Willow Ora, PTA, [place] [address] [place], [place] [address] [phone] 08/30/2014, 12:30 PM

## 2014-08-31 ENCOUNTER — Encounter: Payer: Self-pay | Admitting: Physical Therapy

## 2014-08-31 ENCOUNTER — Ambulatory Visit: Payer: Commercial Managed Care - HMO | Admitting: Physical Therapy

## 2014-08-31 DIAGNOSIS — Z7409 Other reduced mobility: Secondary | ICD-10-CM

## 2014-08-31 DIAGNOSIS — R6889 Other general symptoms and signs: Secondary | ICD-10-CM

## 2014-08-31 DIAGNOSIS — R2689 Other abnormalities of gait and mobility: Secondary | ICD-10-CM

## 2014-08-31 DIAGNOSIS — R269 Unspecified abnormalities of gait and mobility: Secondary | ICD-10-CM

## 2014-08-31 DIAGNOSIS — R531 Weakness: Secondary | ICD-10-CM

## 2014-08-31 NOTE — Therapy (Signed)
[facility] [address] [place], [place], [address] Phone: [phone]   Fax:  [phone]  Physical Therapy Treatment  Patient Details  Name: FORDYCE LEPAK MRN: [id number] Date of Birth: 05-Apr-1934 Referring Provider:  Laurey Morale, MD  Encounter Date: 08/31/2014      PT End of Session - 08/31/14 0939    Visit Number 25   Number of Visits 30   Date for PT Re-Evaluation 09/09/14   PT Start Time 0933   PT Stop Time 1015   PT Time Calculation (min) 42 min   Equipment Utilized During Treatment Gait belt   Activity Tolerance Patient limited by fatigue   Behavior During Therapy [facility] for tasks assessed/performed      Past Medical History  Diagnosis Date  . Gout   . Benign prostatic hypertrophy     takes Proscar daily  . Atrial fibrillation     takes Warfarin daily  . Chronic systolic dysfunction of left ventricle     a. mixed ischemic and nonischemic CM,  EF 35%. b. s/p AICD implantation.  . ED (erectile dysfunction)   . Arthritis   . Pacemaker     medtronic  . CAD (coronary artery disease)     a. s/p mid LAD stenting with DES 2008 Dr. Daneen Schick  . ICD (implantable cardiac defibrillator) in place     medtronic, Dr. Rayann Heman  . ICD (implantable cardiac defibrillator) in place   . Sleep apnea     hx of "had surgery for"  . Automatic implantable cardioverter-defibrillator in situ   . Depression   . Numbness and tingling     Hx; 31f left foot  . PAD (peripheral artery disease)     Severe by PV angiogram 09/2011  . Renal artery stenosis     s/p stenting 2009  . PAD (peripheral artery disease)     s/p multiple LLE bypass grafts; left mid-distal SCA occlusion by 08/2012 duplex  . Hypertension     takes Carvedilol and Losartan daily  . Constipation     takes Miralax daily as needed and Colace daily   . Type II diabetes mellitus     takes Novolog 70/30  . CHF (congestive heart failure)     takes Lasix  daily  . History of MRSA infection   . Insomnia     TAKES TRAZODONE NIGHTLY    Past Surgical History  Procedure Laterality Date  . Turp vaporization    . Cardiac defibrillator placement  12/26/09    pacemaker combo  . Cervical epidural injection  2013  . Femoral-tibial bypass graft  09/25/2011    Procedure: BYPASS GRAFT FEMORAL-TIBIAL ARTERY;  Surgeon: Mal Misty, MD;  Location: [facility] OR;  Service: Vascular;  Laterality: Left;  Left Femoral - Anterior Tibial Bypass;  saphenous vein graft from left leg  . Intraoperative arteriogram  09/25/2011    Procedure: INTRA OPERATIVE ARTERIOGRAM;  Surgeon: Mal Misty, MD;  Location: [place];  Service: Vascular;  Laterality: Left;  . Femoral-tibial bypass graft  02/07/2012    Procedure: BYPASS GRAFT FEMORAL-TIBIAL ARTERY;  Surgeon: Rosetta Posner, MD;  Location: [place];  Service: Vascular;  Laterality: Left;  Thrombectomy Left Femoral - Tibial Bypass Graft  . Embolectomy  02/07/2012    Procedure: EMBOLECTOMY;  Surgeon: Rosetta Posner, MD;  Location: [place];  Service: Vascular;  Laterality: Left;  . Femoral-tibial bypass graft  04/03/2012    Procedure: BYPASS GRAFT FEMORAL-TIBIAL ARTERY;  Surgeon: Mal Misty, MD;  Location: [place];  Service: Vascular;  Laterality: Left;  Redo  . Insert / replace / remove pacemaker  2007  . Coronary angioplasty with stent placement  ~ 2000  . Coronary angioplasty    . Uvulopalatopharyngoplasty (uppp)/tonsillectomy/septoplasty  06/30/2003    Archie Endo 06/30/2003 (07/10/2012)  . Shoulder open rotator cuff repair Right 2001    repair of lacerated right/notes 10/11/1999  (07/10/2012)  . Foot surgery Right 03/20/2001    "have plates and screws in; didn't break it" (07/10/2012)  . Carpal tunnel release Right 2002    Archie Endo 03/20/2001 (07/10/2012)  . Biceps tendon repair Right 2001    Archie Endo 03/20/2001 (07/10/2012)  . Renal artery stent  2009  . Femoral-popliteal bypass graft Left 09/16/2012    Procedure: LEFT FEMORAL-POPLITEAL BYPASS GRAFT  WITH GORTEX Propaten Graft 6x80 Thin Wall and Left lower leg Angiogram;  Surgeon: Mal Misty, MD;  Location: [place];  Service: Vascular;  Laterality: Left;  . Colonoscopy      Hx; of  . Tonsillectomy    . Adenoidectomy      Hx; of   . Femoral-tibial bypass graft Left 09/30/2012    Procedure: REDO LEFT FEMORAL-ANTERIOR TIBIAL ARTERY BYPASS USING COMPOSITE CEPHALIC AND BASILIC VEIN GRAFT FROM LEFT ARM;  Surgeon: Mal Misty, MD;  Location: [place];  Service: Vascular;  Laterality: Left;  . I&d extremity Left 10/21/2012    Procedure: EXPLORATION AND DEBRIDEMENT OF LEFT GROIN WOUND;  Surgeon: Mal Misty, MD;  Location: [place];  Service: Vascular;  Laterality: Left;  . Patch angioplasty Left 10/21/2012    Procedure: PATCH ANGIOPLASTY;  Surgeon: Mal Misty, MD;  Location: [place];  Service: Vascular;  Laterality: Left;  . Groin debridement Left 11/12/2012    Procedure: CLOSURE INGUINAL WOUND;  Surgeon: Mal Misty, MD;  Location: [place];  Service: Vascular;  Laterality: Left;  . Embolectomy Left 12/16/2012    Procedure: THROMBECTOMY  LEFT LEG BYPASS;  Surgeon: Elam Dutch, MD;  Location: [place];  Service: Vascular;  Laterality: Left;  . Leg amputation above knee Left 02/04/2013    DR LAWSON  . Amputation Left 02/04/2013    Procedure: AMPUTATION ABOVE KNEE-LEFT;  Surgeon: Mal Misty, MD;  Location: [place];  Service: Vascular;  Laterality: Left;  . Removal of graft Left 04/16/2013    Procedure: I & D LEFT AKA WOUND, POSSIBLE REMOVAL OF INFECTED GORTEX GRAFT;  Surgeon: Mal Misty, MD;  Location: [place];  Service: Vascular;  Laterality: Left;  . Abdominal aortagram N/A 09/11/2011    Procedure: ABDOMINAL Maxcine Ham;  Surgeon: Wellington Hampshire, MD;  Location: [facility] CATH LAB;  Service: Cardiovascular;  Laterality: N/A;  . Lower extremity angiogram Left 12/25/2011    Procedure: LOWER EXTREMITY ANGIOGRAM;  Surgeon: Serafina Mitchell, MD;  Location: [facility] CATH LAB;  Service: Cardiovascular;   Laterality: Left;  lt leg angio co2  . Abdominal angiogram  12/25/2011    Procedure: ABDOMINAL ANGIOGRAM;  Surgeon: Serafina Mitchell, MD;  Location: [facility] CATH LAB;  Service: Cardiovascular;;  . Lower extremity angiogram N/A 07/07/2012    Procedure: LOWER EXTREMITY ANGIOGRAM;  Surgeon: Conrad Rosendale, MD;  Location: [facility] CATH LAB;  Service: Cardiovascular;  Laterality: N/A;  . Lower extremity angiogram N/A 09/15/2012    Procedure: LOWER EXTREMITY ANGIOGRAM;  Surgeon: Serafina Mitchell, MD;  Location: [facility] CATH LAB;  Service: Cardiovascular;  Laterality: N/A;  . Upper extremity angiogram  09/15/2012  Procedure: UPPER EXTREMITY ANGIOGRAM;  Surgeon: Serafina Mitchell, MD;  Location: [facility] CATH LAB;  Service: Cardiovascular;;  . Biv icd genertaor change out N/A 11/09/2013    Procedure: BIV ICD GENERTAOR CHANGE OUT;  Surgeon: Coralyn Mark, MD;  Location: [facility] CATH LAB;  Service: Cardiovascular;  Laterality: N/A;    There were no vitals filed for this visit.  Visit Diagnosis:  Abnormality of gait  Balance problems  Impaired functional mobility and activity tolerance  Weakness generalized  Activity intolerance      Subjective Assessment - 08/31/14 0938    Subjective No new complaints. No falls to report.    Currently in Pain? Yes   Pain Score 6    Pain Location Leg   Pain Orientation Left   Pain Descriptors / Indicators Aching;Sharp;Sore   Pain Type Phantom pain   Pain Onset More than a month ago   Pain Frequency Constant   Aggravating Factors  liner/prosthetic wear, weight bearing   Pain Relieving Factors medicine, removing prostheis/liner          [facility] Adult PT Treatment/Exercise - 08/31/14 0940    Transfers   Sit to Stand 5: Supervision;With upper extremity assist;With armrests;From chair/3-in-1   Sit to Stand Details (indicate cue type and reason) cues on technique (scoot forward) and to engage prosthetic knee   Stand to Sit 5: Supervision;With upper extremity assist;With armrests;To  chair/3-in-1   Stand to Sit Details cues to reach back and to turn all the way   Ambulation/Gait   Ambulation/Gait Yes   Ambulation/Gait Assistance 5: Supervision;4: Min guard   Ambulation/Gait Assistance Details min cues needed today on posture and equal step length with gait. Pt with improved use of prosthetic knee with swing phase with gait today.   Ambulation Distance (Feet) 130 Feet  x2, second lap also included ramp/curb with seated rest   Assistive device Prosthesis;Rollator   Gait Pattern Decreased step length - right;Decreased stance time - left;Decreased stride length;Antalgic;Step-through pattern;Trunk flexed   Ambulation Surface Level;Indoor   Prosthetics   Prosthetic Care Comments  Arnetha Courser, he just stated for pt to use antibacterial soap for rash. did not have any other suggestions. Also did not have any new ides for prosthesis to stay on tight with suction seal. Reinforced with pt to release air from valve after everytime he sits down.                              Current prosthetic wear tolerance (days/week)  daily   Current prosthetic wear tolerance (#hours/day)  >/= 90% of awake hours   Residual limb condition  continutes to have rash, otherwise intact   Education Provided Residual limb care;Proper wear schedule/adjustment;Proper weight-bearing schedule/adjustment   Person(s) Educated Patient   Education Method Explanation;Verbal cues   Education Method Verbalized understanding   Donning Prosthesis Modified independent (device/increased time)   Doffing Prosthesis Modified independent (device/increased time)     Neuro Re-ed: Unsupported standing in parallel bars using red theraband: 10 reps each bil arms with cues on posture, equal weight bearing and ex form - rows - shoulder extension - shoulder horizontal abduction - Alternating shoulder raises facing band  - alternating forward punches - simultaneous forward punches - alternating UE raises          PT Short  Term Goals - 08/08/14 0936    PT SHORT TERM GOAL #1   Title donnes prosthesis with new suspension correctly independently. (  Target Date: 06/17/14)   Status Achieved   PT SHORT TERM GOAL #2   Title demonstrates understanding of initial HEP. (Target Date: 06/17/14)   Status Achieved   PT SHORT TERM GOAL #3   Title wears prosthesis >70% of awake hours >/= 5 days /wk. (Target Date: 06/17/14)   Status Achieved   PT SHORT TERM GOAL #4   Title ambulates 100' with rolling walker & prosthesis with cues only for deviations. (Target Date: 06/17/14)   Status Achieved   PT SHORT TERM GOAL #5   Title stands 30sec without UE support with supervision. (Target Date: 06/17/14)   Baseline MET 07/14/14   Status Achieved   PT SHORT TERM GOAL #6   Title pt ambulates 150' with rollater with supervision. (Target Date 08/11/14)   Status New   PT SHORT TERM GOAL #7   Title pt negotiates ramps and curb with rollator with supervision. (Target Date 08/11/14)   Status New   PT SHORT TERM GOAL #8   Title pt able to transfer safely from [facility] to chair with armrests with supervision. (Target Date 08/11/14)   Status New           PT Long Term Goals - 07/14/14 1648    PT LONG TERM GOAL #1   Title wears prosthesis >80% of awake hours daily. (Target Date: 07/15/14)   Baseline MET 07/14/14   Time 8   Period Weeks   Status Achieved   PT LONG TERM GOAL #2   Title verbalize understanding of ongoing fitness plan. (NEW Target Date: 09/09/14)   Baseline MET 07/14/14 PT is continuing to update   Time 8   Period Weeks   Status On-going   PT LONG TERM GOAL #3   Title ambulates 200' with rolling walker & prosthesis modified independent. (NEW Target Date: 09/09/14)   Baseline 07/14/14 progressing. pt able to ambulate for 100' with rollator with supervision   Time 8   Period Weeks   Status On-going   PT LONG TERM GOAL #4   Title Berg Balance >18/56 (Target Date: 07/15/14)   Baseline MET 07/14/14. BERG 20/56   Time 8   Period Weeks    Status Achieved   PT LONG TERM GOAL #5   Title negotiate ramp, curb & stairs (1 rail) with rolling walker & prosthesis modified independent. (New Target Date: 09/09/14)   Baseline 07/14/14 progressing. pt able to negotiate ramp and curb with rollator with minA and constant verbal cues   Time 8   Period Weeks   Status On-going   PT LONG TERM GOAL #6   Title BERG balance > /= 24/56   Time 8   Period Weeks   Status New           Plan - 08/31/14 4818    Clinical Impression Statement Pt with improved step length and prosthetic knee flexion with gait today. Pt quick to fatigue with unsupported standing activities needed cues on posture and rest breaks with UE support on parallel bars. Pt making steady progress toward goals.                        Pt will benefit from skilled therapeutic intervention in order to improve on the following deficits Abnormal gait;Cardiopulmonary status limiting activity;Decreased activity tolerance;Decreased balance;Decreased endurance;Decreased knowledge of use of DME;Decreased mobility;Decreased range of motion;Decreased strength;Pain;Other (comment)   Rehab Potential Good   PT Frequency 2x / week   PT Duration 8 weeks  PT Treatment/Interventions ADLs/Self Care Home Management;DME Instruction;Gait training;Stair training;Functional mobility training;Therapeutic activities;Therapeutic exercise;Balance training;Neuromuscular re-education;Patient/family education;Other (comment)   PT Next Visit Plan continue toward LTG's   Consulted and Agree with Plan of Care Patient        Problem List Patient Active Problem List   Diagnosis Date Noted  . Phantom limb syndrome with pain 08/03/2014  . Diabetic polyneuropathy associated with diabetes mellitus due to underlying condition 07/12/2014  . CVA (cerebral infarction) 06/15/2014  . Expressive aphasia 06/15/2014  . CKD (chronic kidney disease) stage 4, GFR 15-29 ml/min 06/15/2014  . DM (diabetes mellitus), type 2,  uncontrolled, with renal complications 89/37/3428  . Unilateral AKA 02/09/2013  . S/P ICD (internal cardiac defibrillator) procedure 05/18/2012  . PAD (peripheral artery disease) 09/04/2011  . CAD (coronary artery disease)   . Chronic systolic congestive heart failure 07/06/2010  . Gout 09/18/2007  . Essential hypertension 01/23/2007  . Atrial fibrillation 01/23/2007  . BPH (benign prostatic hyperplasia) 01/23/2007    Willow Ora 08/31/2014, 10:49 AM  Willow Ora, PTA, [facility] [address] [place], [place] [address] [phone] 08/31/2014, 10:49 AM

## 2014-09-01 ENCOUNTER — Encounter (HOSPITAL_BASED_OUTPATIENT_CLINIC_OR_DEPARTMENT_OTHER): Payer: Commercial Managed Care - HMO | Admitting: Registered Nurse

## 2014-09-01 ENCOUNTER — Encounter: Payer: Self-pay | Admitting: Registered Nurse

## 2014-09-01 VITALS — BP 126/80 | HR 88 | Resp 14

## 2014-09-01 DIAGNOSIS — Z5181 Encounter for therapeutic drug level monitoring: Secondary | ICD-10-CM | POA: Diagnosis not present

## 2014-09-01 DIAGNOSIS — Z89612 Acquired absence of left leg above knee: Secondary | ICD-10-CM | POA: Diagnosis not present

## 2014-09-01 DIAGNOSIS — G894 Chronic pain syndrome: Secondary | ICD-10-CM | POA: Diagnosis not present

## 2014-09-01 DIAGNOSIS — M79672 Pain in left foot: Secondary | ICD-10-CM | POA: Diagnosis not present

## 2014-09-01 DIAGNOSIS — Z79899 Other long term (current) drug therapy: Secondary | ICD-10-CM

## 2014-09-01 DIAGNOSIS — G546 Phantom limb syndrome with pain: Secondary | ICD-10-CM

## 2014-09-01 DIAGNOSIS — S78112A Complete traumatic amputation at level between left hip and knee, initial encounter: Secondary | ICD-10-CM

## 2014-09-01 MED ORDER — OXYCODONE-ACETAMINOPHEN 7.5-325 MG PO TABS: 1.0000 | ORAL_TABLET | Freq: Four times a day (QID) | ORAL | Status: DC | PRN

## 2014-09-01 NOTE — Progress Notes (Signed)
Subjective:    Patient ID: [name], male    DOB: 1934-11-30, 80 y.o.   MRN: [id number]  HPI: [name] is a 80 year old male who returns for follow up pain and medication refill. He states his phantom pain is located in his left calf and left foot. He rates his pain 8. He has been taking his medication on the average 1-2 two tablets daily. Will increase his tablets today. Continue gabapentin as ordered. He verbalizes understanding.He verbalizes understanding. His current exercise regime is attending physical therapy twice a week and [employer] twice a week using the elliptical. Also states at times having urinary issues following PCP and will be scheduled with urology.  Pain Inventory Average Pain 7 Pain Right Now 8 My pain is intermittent, sharp and stabbing  In the last 24 hours, has pain interfered with the following? General activity 5 Relation with others 5 Enjoyment of life 5 What TIME of day is your pain at its worst? evening Sleep (in general) Poor  Pain is worse with: some activites Pain improves with: rest Relief from Meds: 5  Mobility walk without assistance use a cane how many minutes can you walk? 5 ability to climb steps?  yes do you drive?  yes use a wheelchair  Function retired  Neuro/Psych bladder control problems dizziness  Prior Studies Any changes since last visit?  no  Physicians involved in your care Any changes since last visit?  no   Family History  Problem Relation Age of Onset  . Cancer      breast/fhx  . Heart disease      fhx  . Diabetes Neg Hx   . Cancer Father    History   Social History  . Marital Status: Widowed    Spouse Name: N/A  . Number of Children: N/A  . Years of Education: N/A   Occupational History  . Retired    Social History Main Topics  . Smoking status: Current Some Day Smoker    Types: Cigars  . Smokeless tobacco: Never Used     Comment: 1 cigar per week  . Alcohol Use: 0.0 oz/week   0 Standard drinks or equivalent per week     Comment: less than once a week  . Drug Use: No  . Sexual Activity: Not Currently   Other Topics Concern  . None   Social History Narrative   Lives in [place] with significant other,  [name].   Past Surgical History  Procedure Laterality Date  . Turp vaporization    . Cardiac defibrillator placement  12/26/09    pacemaker combo  . Cervical epidural injection  2013  . Femoral-tibial bypass graft  09/25/2011    Procedure: BYPASS GRAFT FEMORAL-TIBIAL ARTERY;  Surgeon: Mal Misty, MD;  Location: [facility];  Service: Vascular;  Laterality: Left;  Left Femoral - Anterior Tibial Bypass;  saphenous vein graft from left leg  . Intraoperative arteriogram  09/25/2011    Procedure: INTRA OPERATIVE ARTERIOGRAM;  Surgeon: Mal Misty, MD;  Location: [place];  Service: Vascular;  Laterality: Left;  . Femoral-tibial bypass graft  02/07/2012    Procedure: BYPASS GRAFT FEMORAL-TIBIAL ARTERY;  Surgeon: Rosetta Posner, MD;  Location: [place];  Service: Vascular;  Laterality: Left;  Thrombectomy Left Femoral - Tibial Bypass Graft  . Embolectomy  02/07/2012    Procedure: EMBOLECTOMY;  Surgeon: Rosetta Posner, MD;  Location: [place];  Service: Vascular;  Laterality: Left;  .  Femoral-tibial bypass graft  04/03/2012    Procedure: BYPASS GRAFT FEMORAL-TIBIAL ARTERY;  Surgeon: Mal Misty, MD;  Location: [place];  Service: Vascular;  Laterality: Left;  Redo  . Insert / replace / remove pacemaker  2007  . Coronary angioplasty with stent placement  ~ 2000  . Coronary angioplasty    . Uvulopalatopharyngoplasty (uppp)/tonsillectomy/septoplasty  06/30/2003    Archie Endo 06/30/2003 (07/10/2012)  . Shoulder open rotator cuff repair Right 2001    repair of lacerated right/notes 10/11/1999  (07/10/2012)  . Foot surgery Right 03/20/2001    "have plates and screws in; didn't break it" (07/10/2012)  . Carpal tunnel release Right 2002    Archie Endo 03/20/2001 (07/10/2012)  . Biceps tendon repair Right  2001    Archie Endo 03/20/2001 (07/10/2012)  . Renal artery stent  2009  . Femoral-popliteal bypass graft Left 09/16/2012    Procedure: LEFT FEMORAL-POPLITEAL BYPASS GRAFT WITH GORTEX Propaten Graft 6x80 Thin Wall and Left lower leg Angiogram;  Surgeon: Mal Misty, MD;  Location: [place];  Service: Vascular;  Laterality: Left;  . Colonoscopy      Hx; of  . Tonsillectomy    . Adenoidectomy      Hx; of   . Femoral-tibial bypass graft Left 09/30/2012    Procedure: REDO LEFT FEMORAL-ANTERIOR TIBIAL ARTERY BYPASS USING COMPOSITE CEPHALIC AND BASILIC VEIN GRAFT FROM LEFT ARM;  Surgeon: Mal Misty, MD;  Location: [place];  Service: Vascular;  Laterality: Left;  . I&d extremity Left 10/21/2012    Procedure: EXPLORATION AND DEBRIDEMENT OF LEFT GROIN WOUND;  Surgeon: Mal Misty, MD;  Location: [place];  Service: Vascular;  Laterality: Left;  . Patch angioplasty Left 10/21/2012    Procedure: PATCH ANGIOPLASTY;  Surgeon: Mal Misty, MD;  Location: [place];  Service: Vascular;  Laterality: Left;  . Groin debridement Left 11/12/2012    Procedure: CLOSURE INGUINAL WOUND;  Surgeon: Mal Misty, MD;  Location: [place];  Service: Vascular;  Laterality: Left;  . Embolectomy Left 12/16/2012    Procedure: THROMBECTOMY  LEFT LEG BYPASS;  Surgeon: Elam Dutch, MD;  Location: [place];  Service: Vascular;  Laterality: Left;  . Leg amputation above knee Left 02/04/2013    DR LAWSON  . Amputation Left 02/04/2013    Procedure: AMPUTATION ABOVE KNEE-LEFT;  Surgeon: Mal Misty, MD;  Location: [place];  Service: Vascular;  Laterality: Left;  . Removal of graft Left 04/16/2013    Procedure: I & D LEFT AKA WOUND, POSSIBLE REMOVAL OF INFECTED GORTEX GRAFT;  Surgeon: Mal Misty, MD;  Location: [place];  Service: Vascular;  Laterality: Left;  . Abdominal aortagram N/A 09/11/2011    Procedure: ABDOMINAL Maxcine Ham;  Surgeon: Wellington Hampshire, MD;  Location: [facility] CATH LAB;  Service: Cardiovascular;  Laterality: N/A;  . Lower  extremity angiogram Left 12/25/2011    Procedure: LOWER EXTREMITY ANGIOGRAM;  Surgeon: Serafina Mitchell, MD;  Location: [facility] CATH LAB;  Service: Cardiovascular;  Laterality: Left;  lt leg angio co2  . Abdominal angiogram  12/25/2011    Procedure: ABDOMINAL ANGIOGRAM;  Surgeon: Serafina Mitchell, MD;  Location: [facility] CATH LAB;  Service: Cardiovascular;;  . Lower extremity angiogram N/A 07/07/2012    Procedure: LOWER EXTREMITY ANGIOGRAM;  Surgeon: Conrad Idanha, MD;  Location: [facility] CATH LAB;  Service: Cardiovascular;  Laterality: N/A;  . Lower extremity angiogram N/A 09/15/2012    Procedure: LOWER EXTREMITY ANGIOGRAM;  Surgeon: Serafina Mitchell, MD;  Location: [facility] CATH LAB;  Service: Cardiovascular;  Laterality: N/A;  . Upper extremity angiogram  09/15/2012    Procedure: UPPER EXTREMITY ANGIOGRAM;  Surgeon: Serafina Mitchell, MD;  Location: [facility] CATH LAB;  Service: Cardiovascular;;  . Biv icd genertaor change out N/A 11/09/2013    Procedure: BIV ICD GENERTAOR CHANGE OUT;  Surgeon: Coralyn Mark, MD;  Location: [facility] CATH LAB;  Service: Cardiovascular;  Laterality: N/A;   Past Medical History  Diagnosis Date  . Gout   . Benign prostatic hypertrophy     takes Proscar daily  . Atrial fibrillation     takes Warfarin daily  . Chronic systolic dysfunction of left ventricle     a. mixed ischemic and nonischemic CM,  EF 35%. b. s/p AICD implantation.  . ED (erectile dysfunction)   . Arthritis   . Pacemaker     medtronic  . CAD (coronary artery disease)     a. s/p mid LAD stenting with DES 2008 Dr. Daneen Schick  . ICD (implantable cardiac defibrillator) in place     medtronic, Dr. Rayann Heman  . ICD (implantable cardiac defibrillator) in place   . Sleep apnea     hx of "had surgery for"  . Automatic implantable cardioverter-defibrillator in situ   . Depression   . Numbness and tingling     Hx; 38f left foot  . PAD (peripheral artery disease)     Severe by PV angiogram 09/2011  . Renal artery stenosis     s/p stenting  2009  . PAD (peripheral artery disease)     s/p multiple LLE bypass grafts; left mid-distal SCA occlusion by 08/2012 duplex  . Hypertension     takes Carvedilol and Losartan daily  . Constipation     takes Miralax daily as needed and Colace daily   . Type II diabetes mellitus     takes Novolog 70/30  . CHF (congestive heart failure)     takes Lasix daily  . History of MRSA infection   . Insomnia     TAKES TRAZODONE NIGHTLY   BP 126/80 mmHg  Pulse 88  Resp 14  SpO2 96%  Opioid Risk Score:   Fall Risk Score: Moderate Fall Risk (6-13 points)`1  Depression screen PHQ 2/9  Depression screen PHQ 2/9 06/03/2014  Decreased Interest 2  Down, Depressed, Hopeless 1  PHQ - 2 Score 3  Altered sleeping 3  Tired, decreased energy 2  Change in appetite 0  Feeling bad or failure about yourself  0  Trouble concentrating 1  Moving slowly or fidgety/restless 0  Suicidal thoughts 0  PHQ-9 Score 9     Review of Systems  Constitutional: Negative.   HENT: Negative.   Eyes: Negative.   Respiratory: Negative.   Cardiovascular:       Easy bleeding  Gastrointestinal: Negative.   Endocrine: Negative.   Genitourinary: Negative.        Bladder control problems  Musculoskeletal: Positive for myalgias and arthralgias.  Skin: Negative.   Allergic/Immunologic: Negative.   Neurological: Positive for dizziness.       Phantom leg pain  Hematological: Negative.   Psychiatric/Behavioral: Positive for dysphoric mood. The patient is nervous/anxious.        Objective:   Physical Exam  Constitutional: He is oriented to person, place, and time. He appears well-developed and well-nourished.  HENT:  Head: Normocephalic and atraumatic.  Neck: Normal range of motion. Neck supple.  Cardiovascular: Normal rate and regular rhythm.   Pulmonary/Chest: Effort normal and breath sounds normal.  Musculoskeletal:  Normal Muscle Bulk and Muscle Testing Reveals: Upper Extremities: Full ROM and Muscle strength  5/5 Lower Extremities: Right: Full ROM and Muscle strength 5/5 Left: AKA with Prosthesis Intact Arrived in wheelchair   Neurological: He is alert and oriented to person, place, and time.  Skin: Skin is warm and dry.  Psychiatric: He has a normal mood and affect.  Nursing note and vitals reviewed.         Assessment & Plan:  1. Functional deficits secondary to left AKA.[place] with Physical Therapy 2. Phantom limb pain: Continue Gabapentin as prescribed two capsules three times a day. He was only taking one capsule three times a day. He verbalizes understanding. Refilled: Percocet 7.5mg  percocet one tablet every 6 hours as needed for breakthrough pain. Increased to 50 tablets  15 minutes of face to face patient care time was spent during this visit. All questions were encouraged and answered.

## 2014-09-06 ENCOUNTER — Encounter: Payer: Self-pay | Admitting: Physical Therapy

## 2014-09-06 ENCOUNTER — Ambulatory Visit: Payer: Commercial Managed Care - HMO | Attending: Family Medicine | Admitting: Physical Therapy

## 2014-09-06 DIAGNOSIS — R29818 Other symptoms and signs involving the nervous system: Secondary | ICD-10-CM | POA: Diagnosis present

## 2014-09-06 DIAGNOSIS — R531 Weakness: Secondary | ICD-10-CM | POA: Diagnosis present

## 2014-09-06 DIAGNOSIS — Z7409 Other reduced mobility: Secondary | ICD-10-CM | POA: Insufficient documentation

## 2014-09-06 DIAGNOSIS — R269 Unspecified abnormalities of gait and mobility: Secondary | ICD-10-CM | POA: Insufficient documentation

## 2014-09-06 DIAGNOSIS — R2689 Other abnormalities of gait and mobility: Secondary | ICD-10-CM

## 2014-09-06 DIAGNOSIS — Z89612 Acquired absence of left leg above knee: Secondary | ICD-10-CM | POA: Diagnosis present

## 2014-09-06 NOTE — Therapy (Signed)
[facility] [address] [place], [place], [address] Phone: [phone]   Fax:  [phone]  Physical Therapy Treatment  Patient Details  Name: [name] MRN: [id number] Date of Birth: [birth year] Referring Provider:  Laurey Morale, MD  Encounter Date: 09/06/2014      PT End of Session - 09/06/14 0845    Visit Number 26   Number of Visits 30   Date for PT Re-Evaluation 09/09/14   PT Start Time 0845   PT Stop Time 0930   PT Time Calculation (min) 45 min   Equipment Utilized During Treatment Gait belt   Activity Tolerance Patient limited by fatigue   Behavior During Therapy [facility] for tasks assessed/performed      Past Medical History  Diagnosis Date  . Gout   . Benign prostatic hypertrophy     takes Proscar daily  . Atrial fibrillation     takes Warfarin daily  . Chronic systolic dysfunction of left ventricle     a. mixed ischemic and nonischemic CM,  EF 35%. b. s/p AICD implantation.  . ED (erectile dysfunction)   . Arthritis   . Pacemaker     medtronic  . CAD (coronary artery disease)     a. s/p mid LAD stenting with DES 2008 Dr. Daneen Schick  . ICD (implantable cardiac defibrillator) in place     medtronic, Dr. Rayann Heman  . ICD (implantable cardiac defibrillator) in place   . Sleep apnea     hx of "had surgery for"  . Automatic implantable cardioverter-defibrillator in situ   . Depression   . Numbness and tingling     Hx; 22f left foot  . PAD (peripheral artery disease)     Severe by PV angiogram 09/2011  . Renal artery stenosis     s/p stenting 2009  . PAD (peripheral artery disease)     s/p multiple LLE bypass grafts; left mid-distal SCA occlusion by 08/2012 duplex  . Hypertension     takes Carvedilol and Losartan daily  . Constipation     takes Miralax daily as needed and Colace daily   . Type II diabetes mellitus     takes Novolog 70/30  . CHF (congestive heart failure)     takes Lasix  daily  . History of MRSA infection   . Insomnia     TAKES TRAZODONE NIGHTLY    Past Surgical History  Procedure Laterality Date  . Turp vaporization    . Cardiac defibrillator placement  12/26/09    pacemaker combo  . Cervical epidural injection  2013  . Femoral-tibial bypass graft  09/25/2011    Procedure: BYPASS GRAFT FEMORAL-TIBIAL ARTERY;  Surgeon: Mal Misty, MD;  Location: [facility] OR;  Service: Vascular;  Laterality: Left;  Left Femoral - Anterior Tibial Bypass;  saphenous vein graft from left leg  . Intraoperative arteriogram  09/25/2011    Procedure: INTRA OPERATIVE ARTERIOGRAM;  Surgeon: Mal Misty, MD;  Location: [place];  Service: Vascular;  Laterality: Left;  . Femoral-tibial bypass graft  02/07/2012    Procedure: BYPASS GRAFT FEMORAL-TIBIAL ARTERY;  Surgeon: Rosetta Posner, MD;  Location: [place];  Service: Vascular;  Laterality: Left;  Thrombectomy Left Femoral - Tibial Bypass Graft  . Embolectomy  02/07/2012    Procedure: EMBOLECTOMY;  Surgeon: Rosetta Posner, MD;  Location: [place];  Service: Vascular;  Laterality: Left;  . Femoral-tibial bypass graft  04/03/2012    Procedure: BYPASS GRAFT FEMORAL-TIBIAL ARTERY;  Surgeon: Mal Misty, MD;  Location: [place];  Service: Vascular;  Laterality: Left;  Redo  . Insert / replace / remove pacemaker  2007  . Coronary angioplasty with stent placement  ~ 2000  . Coronary angioplasty    . Uvulopalatopharyngoplasty (uppp)/tonsillectomy/septoplasty  06/30/2003    Archie Endo 06/30/2003 (07/10/2012)  . Shoulder open rotator cuff repair Right 2001    repair of lacerated right/notes 10/11/1999  (07/10/2012)  . Foot surgery Right 03/20/2001    "have plates and screws in; didn't break it" (07/10/2012)  . Carpal tunnel release Right 2002    Archie Endo 03/20/2001 (07/10/2012)  . Biceps tendon repair Right 2001    Archie Endo 03/20/2001 (07/10/2012)  . Renal artery stent  2009  . Femoral-popliteal bypass graft Left 09/16/2012    Procedure: LEFT FEMORAL-POPLITEAL BYPASS GRAFT  WITH GORTEX Propaten Graft 6x80 Thin Wall and Left lower leg Angiogram;  Surgeon: Mal Misty, MD;  Location: [place];  Service: Vascular;  Laterality: Left;  . Colonoscopy      Hx; of  . Tonsillectomy    . Adenoidectomy      Hx; of   . Femoral-tibial bypass graft Left 09/30/2012    Procedure: REDO LEFT FEMORAL-ANTERIOR TIBIAL ARTERY BYPASS USING COMPOSITE CEPHALIC AND BASILIC VEIN GRAFT FROM LEFT ARM;  Surgeon: Mal Misty, MD;  Location: [facility];  Service: Vascular;  Laterality: Left;  . I&d extremity Left 10/21/2012    Procedure: EXPLORATION AND DEBRIDEMENT OF LEFT GROIN WOUND;  Surgeon: Mal Misty, MD;  Location: [facility];  Service: Vascular;  Laterality: Left;  . Patch angioplasty Left 10/21/2012    Procedure: PATCH ANGIOPLASTY;  Surgeon: Mal Misty, MD;  Location: [place];  Service: Vascular;  Laterality: Left;  . Groin debridement Left 11/12/2012    Procedure: CLOSURE INGUINAL WOUND;  Surgeon: Mal Misty, MD;  Location: [facility];  Service: Vascular;  Laterality: Left;  . Embolectomy Left 12/16/2012    Procedure: THROMBECTOMY  LEFT LEG BYPASS;  Surgeon: Elam Dutch, MD;  Location: [facility];  Service: Vascular;  Laterality: Left;  . Leg amputation above knee Left 02/04/2013    DR LAWSON  . Amputation Left 02/04/2013    Procedure: AMPUTATION ABOVE KNEE-LEFT;  Surgeon: Mal Misty, MD;  Location: [place];  Service: Vascular;  Laterality: Left;  . Removal of graft Left 04/16/2013    Procedure: I & D LEFT AKA WOUND, POSSIBLE REMOVAL OF INFECTED GORTEX GRAFT;  Surgeon: Mal Misty, MD;  Location: [place];  Service: Vascular;  Laterality: Left;  . Abdominal aortagram N/A 09/11/2011    Procedure: ABDOMINAL Maxcine Ham;  Surgeon: Wellington Hampshire, MD;  Location: [facility] CATH LAB;  Service: Cardiovascular;  Laterality: N/A;  . Lower extremity angiogram Left 12/25/2011    Procedure: LOWER EXTREMITY ANGIOGRAM;  Surgeon: Serafina Mitchell, MD;  Location: [facility] CATH LAB;  Service: Cardiovascular;   Laterality: Left;  lt leg angio co2  . Abdominal angiogram  12/25/2011    Procedure: ABDOMINAL ANGIOGRAM;  Surgeon: Serafina Mitchell, MD;  Location: [facility] CATH LAB;  Service: Cardiovascular;;  . Lower extremity angiogram N/A 07/07/2012    Procedure: LOWER EXTREMITY ANGIOGRAM;  Surgeon: Conrad Fiskdale, MD;  Location: [facility] CATH LAB;  Service: Cardiovascular;  Laterality: N/A;  . Lower extremity angiogram N/A 09/15/2012    Procedure: LOWER EXTREMITY ANGIOGRAM;  Surgeon: Serafina Mitchell, MD;  Location: [facility] CATH LAB;  Service: Cardiovascular;  Laterality: N/A;  . Upper extremity angiogram  09/15/2012  Procedure: UPPER EXTREMITY ANGIOGRAM;  Surgeon: Serafina Mitchell, MD;  Location: [facility] CATH LAB;  Service: Cardiovascular;;  . Biv icd genertaor change out N/A 11/09/2013    Procedure: BIV ICD GENERTAOR CHANGE OUT;  Surgeon: Coralyn Mark, MD;  Location: [facility] CATH LAB;  Service: Cardiovascular;  Laterality: N/A;    There were no vitals filed for this visit.  Visit Diagnosis:  Abnormality of gait  Balance problems  Impaired functional mobility and activity tolerance  Weakness generalized  Status post above knee amputation of left lower extremity                       [facility] Adult PT Treatment/Exercise - 09/06/14 0845    Transfers   Sit to Stand 6: Modified independent (Device/Increase time);With upper extremity assist;With armrests;From chair/3-in-1  from rollator seat, to rollator or object to stabilize   Stand to Sit 6: Modified independent (Device/Increase time);With upper extremity assist;With armrests;To chair/3-in-1  to rollator, uses rollator or sturdy object to stabilize   Ambulation/Gait   Ambulation/Gait Yes   Ambulation/Gait Assistance 5: Supervision   Ambulation/Gait Assistance Details cues on positioning with rollator for safety   Ambulation Distance (Feet) 130 Feet  130', 65' X 2 to / from ramp/curb   Assistive device Prosthesis;Rollator   Gait Pattern Decreased step length  - right;Decreased stance time - left;Decreased stride length;Antalgic;Step-through pattern;Trunk flexed   Ambulation Surface Indoor;Level   Ramp 5: Supervision  rollator walker & prosthesis   Ramp Details (indicate cue type and reason) verbal cues on technique   Curb 5: Supervision  rollator walker & prosthesis    Curb Details (indicate cue type and reason) cues on sequence   Prosthetics   Prosthetic Care Comments  PT instructed prostheses feel loose in sitting and should be snug when standing with wt on prosthesis.                  Current prosthetic wear tolerance (days/week)  daily   Current prosthetic wear tolerance (#hours/day)  >/= 90% of awake hours   Residual limb condition  --   Donning Prosthesis Modified independent (device/increased time)   Doffing Prosthesis Modified independent (device/increased time)                PT Education - 09/06/14 0845    Education provided Yes   Education Details functional outcomes with prostheses related to age, amputation level, cause of amputation & fitness level; need for ongoing fitness including endurance, flexibility, strength & balance; posture & need to stretch at doorframe & counters throughout the day   Person(s) Educated Patient   Methods Explanation   Comprehension Verbalized understanding          PT Short Term Goals - 08/08/14 0936    PT SHORT TERM GOAL #1   Title donnes prosthesis with new suspension correctly independently. (Target Date: 06/17/14)   Status Achieved   PT SHORT TERM GOAL #2   Title demonstrates understanding of initial HEP. (Target Date: 06/17/14)   Status Achieved   PT SHORT TERM GOAL #3   Title wears prosthesis >70% of awake hours >/= 5 days /wk. (Target Date: 06/17/14)   Status Achieved   PT SHORT TERM GOAL #4   Title ambulates 100' with rolling walker & prosthesis with cues only for deviations. (Target Date: 06/17/14)   Status Achieved   PT SHORT TERM GOAL #5   Title stands 30sec without UE  support with supervision. (Target Date: 06/17/14)  Baseline MET 07/14/14   Status Achieved   PT SHORT TERM GOAL #6   Title pt ambulates 150' with rollater with supervision. (Target Date 08/11/14)   Status New   PT SHORT TERM GOAL #7   Title pt negotiates ramps and curb with rollator with supervision. (Target Date 08/11/14)   Status New   PT SHORT TERM GOAL #8   Title pt able to transfer safely from [facility] to chair with armrests with supervision. (Target Date 08/11/14)   Status New           PT Long Term Goals - 07/14/14 1648    PT LONG TERM GOAL #1   Title wears prosthesis >80% of awake hours daily. (Target Date: 07/15/14)   Baseline MET 07/14/14   Time 8   Period Weeks   Status Achieved   PT LONG TERM GOAL #2   Title verbalize understanding of ongoing fitness plan. (NEW Target Date: 09/09/14)   Baseline MET 07/14/14 PT is continuing to update   Time 8   Period Weeks   Status On-going   PT LONG TERM GOAL #3   Title ambulates 200' with rolling walker & prosthesis modified independent. (NEW Target Date: 09/09/14)   Baseline 07/14/14 progressing. pt able to ambulate for 100' with rollator with supervision   Time 8   Period Weeks   Status On-going   PT LONG TERM GOAL #4   Title Berg Balance >18/56 (Target Date: 07/15/14)   Baseline MET 07/14/14. BERG 20/56   Time 8   Period Weeks   Status Achieved   PT LONG TERM GOAL #5   Title negotiate ramp, curb & stairs (1 rail) with rolling walker & prosthesis modified independent. (New Target Date: 09/09/14)   Baseline 07/14/14 progressing. pt able to negotiate ramp and curb with rollator with minA and constant verbal cues   Time 8   Period Weeks   Status On-going   PT LONG TERM GOAL #6   Title BERG balance > /= 24/56   Time 8   Period Weeks   Status New               Plan - 09/06/14 0845    Clinical Impression Statement Patient continues to be limited by poor fitness. Patient verbalizes understanding but then has inconsistencies with  reports of activity level outside of PT. Patient needed cues for proper sequencing on curb even though he has performed multiple times here and reports performing in community.  Patient will probably not meet all LTGs but needs to be discharged due to lack of progress next session.   Pt will benefit from skilled therapeutic intervention in order to improve on the following deficits Cardiopulmonary status limiting activity;Decreased activity tolerance;Decreased balance;Decreased endurance;Decreased knowledge of use of DME;Decreased mobility;Decreased range of motion;Decreased strength;Pain;Other (comment)   Rehab Potential Good   PT Frequency 2x / week   PT Duration 8 weeks   PT Treatment/Interventions ADLs/Self Care Home Management;DME Instruction;Gait training;Stair training;Functional mobility training;Therapeutic activities;Therapeutic exercise;Balance training;Neuromuscular re-education;Patient/family education;Other (comment)   PT Next Visit Plan Assess LTG's & discharge   Consulted and Agree with Plan of Care Patient        Problem List Patient Active Problem List   Diagnosis Date Noted  . Phantom limb syndrome with pain 08/03/2014  . Diabetic polyneuropathy associated with diabetes mellitus due to underlying condition 07/12/2014  . CVA (cerebral infarction) 06/15/2014  . Expressive aphasia 06/15/2014  . CKD (chronic kidney disease) stage 4, GFR 15-29 ml/min 06/15/2014  .  DM (diabetes mellitus), type 2, uncontrolled, with renal complications 86/77/3736  . Unilateral AKA 02/09/2013  . S/P ICD (internal cardiac defibrillator) procedure 05/18/2012  . PAD (peripheral artery disease) 09/04/2011  . CAD (coronary artery disease)   . Chronic systolic congestive heart failure 07/06/2010  . Gout 09/18/2007  . Essential hypertension 01/23/2007  . Atrial fibrillation 01/23/2007  . BPH (benign prostatic hyperplasia) 01/23/2007    Jamey Reas PT, DPT 09/06/2014, 5:35 PM  [facility] [address] [place], [place], [address] Phone: [phone]   Fax:  [phone]

## 2014-09-08 ENCOUNTER — Encounter: Payer: Self-pay | Admitting: Internal Medicine

## 2014-09-08 ENCOUNTER — Encounter: Payer: Self-pay | Admitting: Physical Therapy

## 2014-09-08 ENCOUNTER — Ambulatory Visit (INDEPENDENT_AMBULATORY_CARE_PROVIDER_SITE_OTHER): Payer: Commercial Managed Care - HMO | Admitting: *Deleted

## 2014-09-08 ENCOUNTER — Ambulatory Visit: Payer: Commercial Managed Care - HMO | Admitting: Physical Therapy

## 2014-09-08 DIAGNOSIS — I255 Ischemic cardiomyopathy: Secondary | ICD-10-CM

## 2014-09-08 DIAGNOSIS — Z89612 Acquired absence of left leg above knee: Secondary | ICD-10-CM

## 2014-09-08 DIAGNOSIS — R2689 Other abnormalities of gait and mobility: Secondary | ICD-10-CM

## 2014-09-08 DIAGNOSIS — Z7409 Other reduced mobility: Secondary | ICD-10-CM

## 2014-09-08 DIAGNOSIS — R269 Unspecified abnormalities of gait and mobility: Secondary | ICD-10-CM | POA: Diagnosis not present

## 2014-09-08 DIAGNOSIS — Z95810 Presence of automatic (implantable) cardiac defibrillator: Secondary | ICD-10-CM

## 2014-09-08 DIAGNOSIS — R531 Weakness: Secondary | ICD-10-CM

## 2014-09-08 DIAGNOSIS — I5022 Chronic systolic (congestive) heart failure: Secondary | ICD-10-CM | POA: Diagnosis not present

## 2014-09-08 NOTE — Therapy (Signed)
[facility] [address] [place], [place], [address] Phone: [phone]   Fax:  [phone]  Patient Details  Name: [name] MRN: [id number] Date of Birth: Jan 24, 1935 Referring Provider:  No ref. provider found  Encounter Date: 09/08/2014  PHYSICAL THERAPY DISCHARGE SUMMARY  Visits from Start of Care: 27  Current functional level related to goals / functional outcomes:     PT Long Term Goals - 09/08/14 0930    PT LONG TERM GOAL #1   Title wears prosthesis >80% of awake hours daily. (Target Date: 07/15/14)   Baseline MET 07/14/14   Time 8   Period Weeks   Status Achieved   PT LONG TERM GOAL #2   Title verbalize understanding of ongoing fitness plan. (NEW Target Date: 09/09/14)   Baseline MET 09/08/2014   Time 8   Period Weeks   Status Achieved   PT LONG TERM GOAL #3   Title ambulates 200' with rolling walker & prosthesis modified independent. (NEW Target Date: 09/09/14)   Baseline Partially MET Patient ambulates modified independent with rollator walker & prosthesis but distances vary from 45' to 130' depending on vascular pain.   Time 8   Period Weeks   Status Partially Met   PT LONG TERM GOAL #4   Title Berg Balance >18/56 (Target Date: 07/15/14)   Baseline MET 07/14/14. BERG 20/56   Time 8   Period Weeks   Status Achieved   PT LONG TERM GOAL #5   Title negotiate ramp, curb & stairs (1 rail) with rolling walker & prosthesis modified independent. (New Target Date: 09/09/14)   Baseline MET 09/08/2014 with rollator on ramp & curb.    Time 8   Period Weeks   Status Achieved   PT LONG TERM GOAL #6   Title BERG balance > /= 24/56   Baseline NOT MET Berg Balance 21/56   Time 8   Period Weeks   Status Not Met       Remaining deficits: Patient has significant cardiovascular issues which limits his mobility distances. Berg Balance score of 21/56 indicates high fall risk which is why gait with rolling walker or  rollator walker is recommended.    Education / Equipment: Ongoing fitness plan.  Plan: Patient agrees to discharge.  Patient goals were partially met. Patient is being discharged due to meeting the stated rehab goals.  ?????       Oliviarose Punch PT, DPT 09/08/2014, 10:02 PM  [facility] [address] [place], [place], [address] Phone: [phone]   Fax:  [phone]

## 2014-09-08 NOTE — Progress Notes (Signed)
Remote ICD transmission.   

## 2014-09-08 NOTE — Therapy (Signed)
[facility] [address] [place], [place], [address] Phone: [phone]   Fax:  [phone]  Physical Therapy Treatment  Patient Details  Name: [name] MRN: [id number] Date of Birth: November 11, 1934 Referring Provider:  Laurey Morale, MD  Encounter Date: 09/08/2014      PT End of Session - 09/08/14 0930    Visit Number 27   Number of Visits 30   Date for PT Re-Evaluation 09/09/14   PT Start Time 0932   PT Stop Time 1013   PT Time Calculation (min) 41 min   Equipment Utilized During Treatment Gait belt   Activity Tolerance Patient limited by fatigue   Behavior During Therapy [facility] for tasks assessed/performed      Past Medical History  Diagnosis Date  . Gout   . Benign prostatic hypertrophy     takes Proscar daily  . Atrial fibrillation     takes Warfarin daily  . Chronic systolic dysfunction of left ventricle     a. mixed ischemic and nonischemic CM,  EF 35%. b. s/p AICD implantation.  . ED (erectile dysfunction)   . Arthritis   . Pacemaker     medtronic  . CAD (coronary artery disease)     a. s/p mid LAD stenting with DES 2008 Dr. Daneen Schick  . ICD (implantable cardiac defibrillator) in place     medtronic, Dr. Rayann Heman  . ICD (implantable cardiac defibrillator) in place   . Sleep apnea     hx of "had surgery for"  . Automatic implantable cardioverter-defibrillator in situ   . Depression   . Numbness and tingling     Hx; 38fleft foot  . PAD (peripheral artery disease)     Severe by PV angiogram 09/2011  . Renal artery stenosis     s/p stenting 2009  . PAD (peripheral artery disease)     s/p multiple LLE bypass grafts; left mid-distal SCA occlusion by 08/2012 duplex  . Hypertension     takes Carvedilol and Losartan daily  . Constipation     takes Miralax daily as needed and Colace daily   . Type II diabetes mellitus     takes Novolog 70/30  . CHF (congestive heart failure)     takes Lasix  daily  . History of MRSA infection   . Insomnia     TAKES TRAZODONE NIGHTLY    Past Surgical History  Procedure Laterality Date  . Turp vaporization    . Cardiac defibrillator placement  12/26/09    pacemaker combo  . Cervical epidural injection  2013  . Femoral-tibial bypass graft  09/25/2011    Procedure: BYPASS GRAFT FEMORAL-TIBIAL ARTERY;  Surgeon: JMal Misty MD;  [facility]  Service: Vascular;  Laterality: Left;  Left Femoral - Anterior Tibial Bypass;  saphenous vein graft from left leg  . Intraoperative arteriogram  09/25/2011    Procedure: INTRA OPERATIVE ARTERIOGRAM;  Surgeon: JMal Misty MD;  [facility]  Service: Vascular;  Laterality: Left;  . Femoral-tibial bypass graft  02/07/2012    Procedure: BYPASS GRAFT FEMORAL-TIBIAL ARTERY;  Surgeon: TRosetta Posner MD;  [facility]  Service: Vascular;  Laterality: Left;  Thrombectomy Left Femoral - Tibial Bypass Graft  . Embolectomy  02/07/2012    Procedure: EMBOLECTOMY;  Surgeon: TRosetta Posner MD;  [facility] [facility]  Service: Vascular;  Laterality: Left;  . Femoral-tibial bypass graft  04/03/2012    Procedure: BYPASS GRAFT FEMORAL-TIBIAL ARTERY;  Surgeon: Mal Misty, MD;  Location: [place];  Service: Vascular;  Laterality: Left;  Redo  . Insert / replace / remove pacemaker  2007  . Coronary angioplasty with stent placement  ~ 2000  . Coronary angioplasty    . Uvulopalatopharyngoplasty (uppp)/tonsillectomy/septoplasty  06/30/2003    Archie Endo 06/30/2003 (07/10/2012)  . Shoulder open rotator cuff repair Right 2001    repair of lacerated right/notes 10/11/1999  (07/10/2012)  . Foot surgery Right 03/20/2001    "have plates and screws in; didn't break it" (07/10/2012)  . Carpal tunnel release Right 2002    Archie Endo 03/20/2001 (07/10/2012)  . Biceps tendon repair Right 2001    Archie Endo 03/20/2001 (07/10/2012)  . Renal artery stent  2009  . Femoral-popliteal bypass graft Left 09/16/2012    Procedure: LEFT FEMORAL-POPLITEAL BYPASS GRAFT  WITH GORTEX Propaten Graft 6x80 Thin Wall and Left lower leg Angiogram;  Surgeon: Mal Misty, MD;  Location: [place];  Service: Vascular;  Laterality: Left;  . Colonoscopy      Hx; of  . Tonsillectomy    . Adenoidectomy      Hx; of   . Femoral-tibial bypass graft Left 09/30/2012    Procedure: REDO LEFT FEMORAL-ANTERIOR TIBIAL ARTERY BYPASS USING COMPOSITE CEPHALIC AND BASILIC VEIN GRAFT FROM LEFT ARM;  Surgeon: Mal Misty, MD;  Location: [facility];  Service: Vascular;  Laterality: Left;  . I&d extremity Left 10/21/2012    Procedure: EXPLORATION AND DEBRIDEMENT OF LEFT GROIN WOUND;  Surgeon: Mal Misty, MD;  Location: [place];  Service: Vascular;  Laterality: Left;  . Patch angioplasty Left 10/21/2012    Procedure: PATCH ANGIOPLASTY;  Surgeon: Mal Misty, MD;  Location: [place];  Service: Vascular;  Laterality: Left;  . Groin debridement Left 11/12/2012    Procedure: CLOSURE INGUINAL WOUND;  Surgeon: Mal Misty, MD;  Location: [facility];  Service: Vascular;  Laterality: Left;  . Embolectomy Left 12/16/2012    Procedure: THROMBECTOMY  LEFT LEG BYPASS;  Surgeon: Elam Dutch, MD;  Location: [place];  Service: Vascular;  Laterality: Left;  . Leg amputation above knee Left 02/04/2013    DR LAWSON  . Amputation Left 02/04/2013    Procedure: AMPUTATION ABOVE KNEE-LEFT;  Surgeon: Mal Misty, MD;  Location: [place];  Service: Vascular;  Laterality: Left;  . Removal of graft Left 04/16/2013    Procedure: I & D LEFT AKA WOUND, POSSIBLE REMOVAL OF INFECTED GORTEX GRAFT;  Surgeon: Mal Misty, MD;  Location: [place];  Service: Vascular;  Laterality: Left;  . Abdominal aortagram N/A 09/11/2011    Procedure: ABDOMINAL Maxcine Ham;  Surgeon: Wellington Hampshire, MD;  Location: [facility] CATH LAB;  Service: Cardiovascular;  Laterality: N/A;  . Lower extremity angiogram Left 12/25/2011    Procedure: LOWER EXTREMITY ANGIOGRAM;  Surgeon: Serafina Mitchell, MD;  Location: [facility] CATH LAB;  Service: Cardiovascular;   Laterality: Left;  lt leg angio co2  . Abdominal angiogram  12/25/2011    Procedure: ABDOMINAL ANGIOGRAM;  Surgeon: Serafina Mitchell, MD;  Location: [facility] CATH LAB;  Service: Cardiovascular;;  . Lower extremity angiogram N/A 07/07/2012    Procedure: LOWER EXTREMITY ANGIOGRAM;  Surgeon: Conrad Lares, MD;  Location: [facility]ATH LAB;  Service: Cardiovascular;  Laterality: N/A;  . Lower extremity angiogram N/A 09/15/2012    Procedure: LOWER EXTREMITY ANGIOGRAM;  Surgeon: Serafina Mitchell, MD;  Location: [facility] CATH LAB;  Service: Cardiovascular;  Laterality: N/A;  . Upper extremity angiogram  09/15/2012  Procedure: UPPER EXTREMITY ANGIOGRAM;  Surgeon: Serafina Mitchell, MD;  Location: [facility] CATH LAB;  Service: Cardiovascular;;  . Biv icd genertaor change out N/A 11/09/2013    Procedure: BIV ICD GENERTAOR CHANGE OUT;  Surgeon: Coralyn Mark, MD;  Location: [facility] CATH LAB;  Service: Cardiovascular;  Laterality: N/A;    There were no vitals filed for this visit.  Visit Diagnosis:  Abnormality of gait  Balance problems  Impaired functional mobility and activity tolerance  Weakness generalized  Status post above knee amputation of left lower extremity      Subjective Assessment - 09/08/14 0930    Subjective Reports he has not slept well last 4 nights & is tired.   Currently in Pain? Yes   Pain Score 10-Worst pain ever   Pain Location Leg   Pain Orientation Left   Pain Descriptors / Indicators Stabbing   Pain Type Phantom pain   Pain Onset More than a month ago                         [facility] Adult PT Treatment/Exercise - 09/08/14 0930    Transfers   Sit to Stand 6: Modified independent (Device/Increase time);With upper extremity assist;With armrests;From chair/3-in-1  from rollator seat, to rollator or object to stabilize   Stand to Sit 6: Modified independent (Device/Increase time);With upper extremity assist;With armrests;To chair/3-in-1  to rollator, uses rollator or sturdy object to  stabilize   Ambulation/Gait   Ambulation/Gait Yes   Ambulation/Gait Assistance 6: Modified independent (Device/Increase time)   Ambulation Distance (Feet) 85 Feet  85' & 65'   Assistive device Prosthesis;Rollator   Gait Pattern Decreased step length - right;Decreased stance time - left;Decreased stride length;Antalgic;Step-through pattern;Trunk flexed   Ambulation Surface Indoor;Level   Ramp 6: Modified independent (Device)  rollator walker & prosthesis   Curb 6: Modified independent (Device/increase time)  rollator walker & prosthesis    Berg Balance Test   Sit to Stand Able to stand  independently using hands   Standing Unsupported Able to stand 30 seconds unsupported   Sitting with Back Unsupported but Feet Supported on Floor or Stool Able to sit safely and securely 2 minutes   Stand to Sit Controls descent by using hands   Transfers Able to transfer safely, definite need of hands   Standing Unsupported with Eyes Closed Able to stand 3 seconds   Standing Ubsupported with Feet Together Needs help to attain position but able to stand for 30 seconds with feet together   From Standing, Reach Forward with Outstretched Arm Reaches forward but needs supervision   From Standing Position, Pick up Object from Floor Unable to pick up and needs supervision   From Standing Position, Turn to Look Behind Over each Shoulder Needs supervision when turning   Turn 360 Degrees Needs assistance while turning   Standing Unsupported, Alternately Place Feet on Step/Stool Needs assistance to keep from falling or unable to try   Standing Unsupported, One Foot in [employer] balance while stepping or standing   Standing on One Leg Unable to try or needs assist to prevent fall   Total Score 21   Prosthetics   Prosthetic Care Comments  PT instructed prostheses feel loose in sitting and should be snug when standing with wt on prosthesis.                  Current prosthetic wear tolerance (days/week)  daily    Current prosthetic wear tolerance (#  hours/day)  >/= 90% of awake hours                PT Education - 09/08/14 0930    Education provided Yes   Education Details ongoing fitness plan   Person(s) Educated Patient   Methods Explanation   Comprehension Verbalized understanding          PT Short Term Goals - 08/08/14 0936    PT SHORT TERM GOAL #1   Title donnes prosthesis with new suspension correctly independently. (Target Date: 06/17/14)   Status Achieved   PT SHORT TERM GOAL #2   Title demonstrates understanding of initial HEP. (Target Date: 06/17/14)   Status Achieved   PT SHORT TERM GOAL #3   Title wears prosthesis >70% of awake hours >/= 5 days /wk. (Target Date: 06/17/14)   Status Achieved   PT SHORT TERM GOAL #4   Title ambulates 100' with rolling walker & prosthesis with cues only for deviations. (Target Date: 06/17/14)   Status Achieved   PT SHORT TERM GOAL #5   Title stands 30sec without UE support with supervision. (Target Date: 06/17/14)   Baseline MET 07/14/14   Status Achieved   PT SHORT TERM GOAL #6   Title pt ambulates 150' with rollater with supervision. (Target Date 08/11/14)   Status New   PT SHORT TERM GOAL #7   Title pt negotiates ramps and curb with rollator with supervision. (Target Date 08/11/14)   Status New   PT SHORT TERM GOAL #8   Title pt able to transfer safely from [facility] to chair with armrests with supervision. (Target Date 08/11/14)   Status New           PT Long Term Goals - 09/08/14 0930    PT LONG TERM GOAL #1   Title wears prosthesis >80% of awake hours daily. (Target Date: 07/15/14)   Baseline MET 07/14/14   Time 8   Period Weeks   Status Achieved   PT LONG TERM GOAL #2   Title verbalize understanding of ongoing fitness plan. (NEW Target Date: 09/09/14)   Baseline MET 09/08/2014   Time 8   Period Weeks   Status Achieved   PT LONG TERM GOAL #3   Title ambulates 200' with rolling walker & prosthesis modified independent. (NEW Target Date:  09/09/14)   Baseline Partially MET Patient ambulates modified independent with rollator walker & prosthesis but distances vary from 21' to 130' depending on vascular pain.   Time 8   Period Weeks   Status Partially Met   PT LONG TERM GOAL #4   Title Berg Balance >18/56 (Target Date: 07/15/14)   Baseline MET 07/14/14. BERG 20/56   Time 8   Period Weeks   Status Achieved   PT LONG TERM GOAL #5   Title negotiate ramp, curb & stairs (1 rail) with rolling walker & prosthesis modified independent. (New Target Date: 09/09/14)   Baseline MET 09/08/2014 with rollator on ramp & curb.    Time 8   Period Weeks   Status Achieved   PT LONG TERM GOAL #6   Title BERG balance > /= 24/56   Baseline NOT MET Berg Balance 21/56   Time 8   Period Weeks   Status Not Met               Plan - 09/08/14 0930    Clinical Impression Statement Patient appears to have plateaued progress. He needs more strength & endurance before attempting to ambulate without  rolling walker which is his goal. Patient appears safe to ambulate with rolling walker (recommended use in house) and rollator walker in community so he can rest as needed.   Pt will benefit from skilled therapeutic intervention in order to improve on the following deficits Cardiopulmonary status limiting activity;Decreased activity tolerance;Decreased balance;Decreased endurance;Decreased knowledge of use of DME;Decreased mobility;Decreased range of motion;Decreased strength;Pain;Other (comment)   Rehab Potential Good   PT Treatment/Interventions ADLs/Self Care Home Management;DME Instruction;Gait training;Stair training;Functional mobility training;Therapeutic activities;Therapeutic exercise;Balance training;Neuromuscular re-education;Patient/family education;Other (comment)   PT Next Visit Plan discharge today   Consulted and Agree with Plan of Care Patient          G-Codes - 09/30/2014 [id number]    Functional Assessment Tool Used ambulates up to 130' with  rollator walker & prosthesis modified independent; modified independent on ramps & curbs with rollator.   Functional Limitation Mobility: Walking and moving around   Mobility: Walking and Moving Around Goal Status [phone]) At least 40 percent but less than 60 percent impaired, limited or restricted   Mobility: Walking and Moving Around Discharge Status [phone]) At least 40 percent but less than 60 percent impaired, limited or restricted      Problem List Patient Active Problem List   Diagnosis Date Noted  . Phantom limb syndrome with pain 08/03/2014  . Diabetic polyneuropathy associated with diabetes mellitus due to underlying condition 07/12/2014  . CVA (cerebral infarction) 06/15/2014  . Expressive aphasia 06/15/2014  . CKD (chronic kidney disease) stage 4, GFR 15-29 ml/min 06/15/2014  . DM (diabetes mellitus), type 2, uncontrolled, with renal complications 89/37/3428  . Unilateral AKA 02/09/2013  . S/P ICD (internal cardiac defibrillator) procedure 05/18/2012  . PAD (peripheral artery disease) 09/04/2011  . CAD (coronary artery disease)   . Chronic systolic congestive heart failure 07/06/2010  . Gout 09/18/2007  . Essential hypertension 01/23/2007  . Atrial fibrillation 01/23/2007  . BPH (benign prostatic hyperplasia) 01/23/2007    Jamey Reas PT, DPT 09-30-2014, 10:00 PM  [facility] [address] [place], [place], [address] Phone: [phone]   Fax:  [phone]

## 2014-09-09 ENCOUNTER — Telehealth: Payer: Self-pay | Admitting: Family Medicine

## 2014-09-09 NOTE — Telephone Encounter (Signed)
Pt would like a call back about rehab

## 2014-09-10 LAB — CUP PACEART REMOTE DEVICE CHECK
Battery Remaining Longevity: 102 mo
Battery Voltage: 2.99 V
Brady Statistic AP VP Percent: 0 %
Brady Statistic AP VS Percent: 0 %
Brady Statistic AS VP Percent: 99.47 %
Brady Statistic RV Percent Paced: 99.65 %
HIGH POWER IMPEDANCE MEASURED VALUE: 57 Ohm
HighPow Impedance: 44 Ohm
Lead Channel Impedance Value: 1064 Ohm
Lead Channel Impedance Value: 513 Ohm
Lead Channel Impedance Value: 608 Ohm
Lead Channel Impedance Value: 646 Ohm
Lead Channel Pacing Threshold Amplitude: 0.625 V
Lead Channel Pacing Threshold Amplitude: 0.875 V
Lead Channel Pacing Threshold Pulse Width: 0.4 ms
Lead Channel Sensing Intrinsic Amplitude: 0.25 mV
Lead Channel Sensing Intrinsic Amplitude: 17.875 mV
Lead Channel Sensing Intrinsic Amplitude: 17.875 mV
Lead Channel Setting Pacing Amplitude: 2 V
Lead Channel Setting Pacing Amplitude: 2.5 V
Lead Channel Setting Pacing Pulse Width: 0.4 ms
Lead Channel Setting Sensing Sensitivity: 0.3 mV
MDC IDC MSMT LEADCHNL LV IMPEDANCE VALUE: 608 Ohm
MDC IDC MSMT LEADCHNL LV PACING THRESHOLD PULSEWIDTH: 0.4 ms
MDC IDC MSMT LEADCHNL RA IMPEDANCE VALUE: 380 Ohm
MDC IDC MSMT LEADCHNL RA SENSING INTR AMPL: 0.25 mV
MDC IDC SESS DTM: 20160707151150
MDC IDC SET LEADCHNL RV PACING PULSEWIDTH: 0.4 ms
MDC IDC SET ZONE DETECTION INTERVAL: 410 ms
MDC IDC STAT BRADY AS VS PERCENT: 0.53 %
MDC IDC STAT BRADY RA PERCENT PACED: 0 %
Zone Setting Detection Interval: 290 ms
Zone Setting Detection Interval: 320 ms
Zone Setting Detection Interval: 450 ms

## 2014-09-12 ENCOUNTER — Encounter: Payer: Commercial Managed Care - HMO | Admitting: Physical Therapy

## 2014-09-12 NOTE — Progress Notes (Signed)
EPIC Encounter for ICM Monitoring  Patient Name: [name] is a 80 y.o. male Date: 09/12/2014 Primary Care Physican: Laurey Morale, MD Primary Cardiologist: Tamala Julian Electrophysiologist: Allred Dry Weight: 184 lbs (with prosthesis)       In the past month, have you:  1. Gained more than 2 pounds in a day or more than 5 pounds in a week? Uncertain. He has not been weighing regularly.  2. Had changes in your medications (with verification of current medications)? no  3. Had more shortness of breath than is usual for you? no  4. Limited your activity because of shortness of breath? no  5. Not been able to sleep because of shortness of breath? no  6. Had increased swelling in your feet or ankles? no  7. Had symptoms of dehydration (dizziness, dry mouth, increased thirst, decreased urine output) no  8. Had changes in sodium restriction? no  9. Been compliant with medication? Yes   ICM trend:   Follow-up plan: ICM clinic phone appointment: 10/13/14. The patient reports that he is doing well today. He did state that over the last two weeks, he has noticed some hard knots that have appeared over the bones on his arms. He states that some have gone away, but he still has one. He denies pain or discoloration of the site. I have advised that he watch this and if he has any changes in it or is concerned, he should contact his PCP. He voices understanding.   Copy of note sent to patient's primary care physician, primary cardiologist, and device following physician.  Alvis Lemmings, RN, BSN 09/12/2014 5:29 PM

## 2014-09-12 NOTE — Telephone Encounter (Signed)
I left a voice message for pt to return my call or send a my chart message.

## 2014-09-13 ENCOUNTER — Encounter: Payer: Self-pay | Admitting: *Deleted

## 2014-09-13 NOTE — Telephone Encounter (Signed)
I spoke with pt and he is requesting more rehab therapy with [facility]. He has been working on walking with a cane, going up & down stairs and inclines. He would like to continue working on these things.

## 2014-09-13 NOTE — Telephone Encounter (Signed)
Please call to okay an extension on the rehab. I will sign any faxes that are needed

## 2014-09-13 NOTE — Addendum Note (Signed)
Addended by: Alvis Lemmings C on: 09/13/2014 08:47 AM   Modules accepted: Level of Service

## 2014-09-14 ENCOUNTER — Encounter: Payer: Self-pay | Admitting: Cardiology

## 2014-09-14 ENCOUNTER — Encounter: Payer: Commercial Managed Care - HMO | Admitting: Physical Therapy

## 2014-09-14 NOTE — Telephone Encounter (Signed)
I spoke with pt and he will get paperwork faxed over to our office.

## 2014-09-19 ENCOUNTER — Encounter: Payer: Commercial Managed Care - HMO | Admitting: Physical Therapy

## 2014-09-21 ENCOUNTER — Encounter: Payer: Commercial Managed Care - HMO | Admitting: Physical Therapy

## 2014-09-26 ENCOUNTER — Encounter: Payer: Commercial Managed Care - HMO | Admitting: Physical Therapy

## 2014-09-28 ENCOUNTER — Encounter: Payer: Self-pay | Admitting: Registered Nurse

## 2014-09-28 ENCOUNTER — Encounter: Payer: Commercial Managed Care - HMO | Attending: Physical Medicine & Rehabilitation | Admitting: Registered Nurse

## 2014-09-28 ENCOUNTER — Encounter: Payer: Commercial Managed Care - HMO | Admitting: Physical Therapy

## 2014-09-28 VITALS — BP 138/70 | HR 88 | Resp 14

## 2014-09-28 DIAGNOSIS — G546 Phantom limb syndrome with pain: Secondary | ICD-10-CM

## 2014-09-28 DIAGNOSIS — M79672 Pain in left foot: Secondary | ICD-10-CM | POA: Insufficient documentation

## 2014-09-28 DIAGNOSIS — Z89612 Acquired absence of left leg above knee: Secondary | ICD-10-CM | POA: Diagnosis not present

## 2014-09-28 DIAGNOSIS — S78112A Complete traumatic amputation at level between left hip and knee, initial encounter: Secondary | ICD-10-CM

## 2014-09-28 MED ORDER — OXYCODONE-ACETAMINOPHEN 7.5-325 MG PO TABS: 1.0000 | ORAL_TABLET | Freq: Two times a day (BID) | ORAL | Status: DC | PRN

## 2014-09-28 NOTE — Progress Notes (Signed)
Subjective:    Patient ID: [name], male    DOB: 11/12/1934, 80 y.o.   MRN: [id number]  HPI: [name] is a 80 year old male who returns for follow up pain and medication refill. He states his phantom pain is located in his  left foot. He rates his pain 6. He's taking his medication on the average 1-2 two tablets daily. His current exercise regime is attending physical therapy twice a week and [employer] two to three times a week using the elliptical and performing chair exercises. Also states having problem with his prosthesis hanger is trying to adjust prosthesis. Mr. College states I just want to be able to walk. He is using walker in his home he states.  Pain Inventory Average Pain 8 Pain Right Now 6 My pain is intermittent, sharp and stabbing  In the last 24 hours, has pain interfered with the following? General activity 4 Relation with others 3 Enjoyment of life 4 What TIME of day is your pain at its worst? evening and night Sleep (in general) Poor  Pain is worse with: sitting, standing and some activites Pain improves with: rest and medication Relief from Meds: 5  Mobility use a walker how many minutes can you walk? 2-3 ability to climb steps?  yes do you drive?  yes  Function retired  Neuro/Psych bladder control problems tremor  Prior Studies Any changes since last visit?  no  Physicians involved in your care Any changes since last visit?  no   Family History  Problem Relation Age of Onset  . Cancer      breast/fhx  . Heart disease      fhx  . Diabetes Neg Hx   . Cancer Father    History   Social History  . Marital Status: Widowed    Spouse Name: N/A  . Number of Children: N/A  . Years of Education: N/A   Occupational History  . Retired    Social History Main Topics  . Smoking status: Current Some Day Smoker    Types: Cigars  . Smokeless tobacco: Never Used     Comment: 1 cigar per week  . Alcohol Use: 0.0 oz/week    0  Standard drinks or equivalent per week     Comment: less than once a week  . Drug Use: No  . Sexual Activity: Not Currently   Other Topics Concern  . None   Social History Narrative   Lives in [place] with significant other,  [name].   Past Surgical History  Procedure Laterality Date  . Turp vaporization    . Cardiac defibrillator placement  12/26/09    pacemaker combo  . Cervical epidural injection  2013  . Femoral-tibial bypass graft  09/25/2011    Procedure: BYPASS GRAFT FEMORAL-TIBIAL ARTERY;  Surgeon: Mal Misty, MD;  Location: [facility] OR;  Service: Vascular;  Laterality: Left;  Left Femoral - Anterior Tibial Bypass;  saphenous vein graft from left leg  . Intraoperative arteriogram  09/25/2011    Procedure: INTRA OPERATIVE ARTERIOGRAM;  Surgeon: Mal Misty, MD;  Location: [place];  Service: Vascular;  Laterality: Left;  . Femoral-tibial bypass graft  02/07/2012    Procedure: BYPASS GRAFT FEMORAL-TIBIAL ARTERY;  Surgeon: Rosetta Posner, MD;  Location: [place];  Service: Vascular;  Laterality: Left;  Thrombectomy Left Femoral - Tibial Bypass Graft  . Embolectomy  02/07/2012    Procedure: EMBOLECTOMY;  Surgeon: Rosetta Posner, MD;  Location:  [facility] OR;  Service: Vascular;  Laterality: Left;  . Femoral-tibial bypass graft  04/03/2012    Procedure: BYPASS GRAFT FEMORAL-TIBIAL ARTERY;  Surgeon: Mal Misty, MD;  Location: [place];  Service: Vascular;  Laterality: Left;  Redo  . Insert / replace / remove pacemaker  2007  . Coronary angioplasty with stent placement  ~ 2000  . Coronary angioplasty    . Uvulopalatopharyngoplasty (uppp)/tonsillectomy/septoplasty  06/30/2003    Archie Endo 06/30/2003 (07/10/2012)  . Shoulder open rotator cuff repair Right 2001    repair of lacerated right/notes 10/11/1999  (07/10/2012)  . Foot surgery Right 03/20/2001    "have plates and screws in; didn't break it" (07/10/2012)  . Carpal tunnel release Right 2002    Archie Endo 03/20/2001 (07/10/2012)  . Biceps tendon repair Right  2001    Archie Endo 03/20/2001 (07/10/2012)  . Renal artery stent  2009  . Femoral-popliteal bypass graft Left 09/16/2012    Procedure: LEFT FEMORAL-POPLITEAL BYPASS GRAFT WITH GORTEX Propaten Graft 6x80 Thin Wall and Left lower leg Angiogram;  Surgeon: Mal Misty, MD;  Location: [place];  Service: Vascular;  Laterality: Left;  . Colonoscopy      Hx; of  . Tonsillectomy    . Adenoidectomy      Hx; of   . Femoral-tibial bypass graft Left 09/30/2012    Procedure: REDO LEFT FEMORAL-ANTERIOR TIBIAL ARTERY BYPASS USING COMPOSITE CEPHALIC AND BASILIC VEIN GRAFT FROM LEFT ARM;  Surgeon: Mal Misty, MD;  Location: [place];  Service: Vascular;  Laterality: Left;  . I&d extremity Left 10/21/2012    Procedure: EXPLORATION AND DEBRIDEMENT OF LEFT GROIN WOUND;  Surgeon: Mal Misty, MD;  Location: [place];  Service: Vascular;  Laterality: Left;  . Patch angioplasty Left 10/21/2012    Procedure: PATCH ANGIOPLASTY;  Surgeon: Mal Misty, MD;  Location: [place];  Service: Vascular;  Laterality: Left;  . Groin debridement Left 11/12/2012    Procedure: CLOSURE INGUINAL WOUND;  Surgeon: Mal Misty, MD;  Location: [place];  Service: Vascular;  Laterality: Left;  . Embolectomy Left 12/16/2012    Procedure: THROMBECTOMY  LEFT LEG BYPASS;  Surgeon: Elam Dutch, MD;  Location: [place];  Service: Vascular;  Laterality: Left;  . Leg amputation above knee Left 02/04/2013    DR LAWSON  . Amputation Left 02/04/2013    Procedure: AMPUTATION ABOVE KNEE-LEFT;  Surgeon: Mal Misty, MD;  Location: [place];  Service: Vascular;  Laterality: Left;  . Removal of graft Left 04/16/2013    Procedure: I & D LEFT AKA WOUND, POSSIBLE REMOVAL OF INFECTED GORTEX GRAFT;  Surgeon: Mal Misty, MD;  Location: [place];  Service: Vascular;  Laterality: Left;  . Abdominal aortagram N/A 09/11/2011    Procedure: ABDOMINAL Maxcine Ham;  Surgeon: Wellington Hampshire, MD;  Location: [facility] CATH LAB;  Service: Cardiovascular;  Laterality: N/A;  . Lower  extremity angiogram Left 12/25/2011    Procedure: LOWER EXTREMITY ANGIOGRAM;  Surgeon: Serafina Mitchell, MD;  Location: [facility] CATH LAB;  Service: Cardiovascular;  Laterality: Left;  lt leg angio co2  . Abdominal angiogram  12/25/2011    Procedure: ABDOMINAL ANGIOGRAM;  Surgeon: Serafina Mitchell, MD;  Location: [facility] CATH LAB;  Service: Cardiovascular;;  . Lower extremity angiogram N/A 07/07/2012    Procedure: LOWER EXTREMITY ANGIOGRAM;  Surgeon: Conrad Hilldale, MD;  Location: [facility] CATH LAB;  Service: Cardiovascular;  Laterality: N/A;  . Lower extremity angiogram N/A 09/15/2012    Procedure: LOWER EXTREMITY ANGIOGRAM;  Surgeon: Serafina Mitchell, MD;  Location: [facility] CATH LAB;  Service: Cardiovascular;  Laterality: N/A;  . Upper extremity angiogram  09/15/2012    Procedure: UPPER EXTREMITY ANGIOGRAM;  Surgeon: Serafina Mitchell, MD;  Location: [facility] CATH LAB;  Service: Cardiovascular;;  . Biv icd genertaor change out N/A 11/09/2013    Procedure: BIV ICD GENERTAOR CHANGE OUT;  Surgeon: Coralyn Mark, MD;  Location: [facility] CATH LAB;  Service: Cardiovascular;  Laterality: N/A;   Past Medical History  Diagnosis Date  . Gout   . Benign prostatic hypertrophy     takes Proscar daily  . Atrial fibrillation     takes Warfarin daily  . Chronic systolic dysfunction of left ventricle     a. mixed ischemic and nonischemic CM,  EF 35%. b. s/p AICD implantation.  . ED (erectile dysfunction)   . Arthritis   . Pacemaker     medtronic  . CAD (coronary artery disease)     a. s/p mid LAD stenting with DES 2008 Dr. Daneen Schick  . ICD (implantable cardiac defibrillator) in place     medtronic, Dr. Rayann Heman  . ICD (implantable cardiac defibrillator) in place   . Sleep apnea     hx of "had surgery for"  . Automatic implantable cardioverter-defibrillator in situ   . Depression   . Numbness and tingling     Hx; 57f left foot  . PAD (peripheral artery disease)     Severe by PV angiogram 09/2011  . Renal artery stenosis     s/p stenting  2009  . PAD (peripheral artery disease)     s/p multiple LLE bypass grafts; left mid-distal SCA occlusion by 08/2012 duplex  . Hypertension     takes Carvedilol and Losartan daily  . Constipation     takes Miralax daily as needed and Colace daily   . Type II diabetes mellitus     takes Novolog 70/30  . CHF (congestive heart failure)     takes Lasix daily  . History of MRSA infection   . Insomnia     TAKES TRAZODONE NIGHTLY   There were no vitals taken for this visit.  Opioid Risk Score:   Fall Risk Score:  `1  Depression screen PHQ 2/9  Depression screen PHQ 2/9 06/03/2014  Decreased Interest 2  Down, Depressed, Hopeless 1  PHQ - 2 Score 3  Altered sleeping 3  Tired, decreased energy 2  Change in appetite 0  Feeling bad or failure about yourself  0  Trouble concentrating 1  Moving slowly or fidgety/restless 0  Suicidal thoughts 0  PHQ-9 Score 9     Review of Systems  HENT: Negative.   Eyes: Positive for redness.       Stye on left eye  Respiratory: Negative.   Cardiovascular: Negative.   Gastrointestinal: Negative.   Endocrine: Negative.   Genitourinary:       Bladder control problems  Musculoskeletal:       Left leg pain  Skin: Negative.   Allergic/Immunologic: Negative.   Neurological: Positive for tremors.  Hematological: Negative.   Psychiatric/Behavioral: Negative.        Objective:   Physical Exam  Constitutional: He is oriented to person, place, and time. He appears well-developed and well-nourished.  HENT:  Head: Normocephalic and atraumatic.  Neck: Normal range of motion. Neck supple.  Cardiovascular: Normal rate and regular rhythm.   Pulmonary/Chest: Effort normal and breath sounds normal.  Musculoskeletal:  Normal Muscle Bulk and Muscle Testing  Reveals: Upper Extremities : Full ROM and Muscle Strength 5/5 Lower Extremities: Right: Full ROM and Muscle Strength 5/5 Left: AKA with Prosthesis Arrived in Wheelchair   Neurological: He is alert  and oriented to person, place, and time.  Skin: Skin is warm and dry.  Psychiatric: He has a normal mood and affect.  Nursing note and vitals reviewed.         Assessment & Plan:  1. Functional deficits secondary to left AKA.Cointinue with Physical Therapy 2. Phantom limb pain: Continue Gabapentin as prescribed two capsules three times a day. He was only taking one capsule three times a day. He verbalizes understanding. Refilled: Percocet 7.5mg  percocet one tablet every 12 hours as needed for breakthrough pain. #60.  15 minutes of face to face patient care time was spent during this visit. All questions were encouraged and answered.

## 2014-10-06 ENCOUNTER — Encounter: Payer: Self-pay | Admitting: Cardiology

## 2014-10-13 ENCOUNTER — Ambulatory Visit (INDEPENDENT_AMBULATORY_CARE_PROVIDER_SITE_OTHER): Payer: Commercial Managed Care - HMO | Admitting: *Deleted

## 2014-10-13 DIAGNOSIS — Z95810 Presence of automatic (implantable) cardiac defibrillator: Secondary | ICD-10-CM | POA: Diagnosis not present

## 2014-10-13 DIAGNOSIS — I5022 Chronic systolic (congestive) heart failure: Secondary | ICD-10-CM

## 2014-10-14 ENCOUNTER — Encounter: Payer: Self-pay | Admitting: *Deleted

## 2014-10-14 NOTE — Progress Notes (Signed)
EPIC Encounter for ICM Monitoring  Patient Name: [name] is a 80 y.o. male Date: 10/14/2014 Primary Care Physican: Laurey Morale, MD Primary Cardiologist: Tamala Julian Electrophysiologist: Allred Dry Weight: 174 lbs (without prosthesis)  Bi-V pacing: 99.8%       In the past month, have you:  1. Gained more than 2 pounds in a day or more than 5 pounds in a week? no  2. Had changes in your medications (with verification of current medications)? no  3. Had more shortness of breath than is usual for you? no  4. Limited your activity because of shortness of breath? no  5. Not been able to sleep because of shortness of breath? no  6. Had increased swelling in your feet or ankles? no  7. Had symptoms of dehydration (dizziness, dry mouth, increased thirst, decreased urine output) no  8. Had changes in sodium restriction? no  9. Been compliant with medication? Yes   ICM trend:   Follow-up plan: ICM clinic phone appointment: 11/15/14. Very slight elevation in optivol trends noted over the last few days. The patient denies any change in symptoms or weight. No changes made today.  Copy of note sent to patient's primary care physician, primary cardiologist, and device following physician.  Alvis Lemmings, RN, BSN 10/14/2014 4:42 PM

## 2014-10-19 ENCOUNTER — Telehealth: Payer: Self-pay | Admitting: *Deleted

## 2014-10-19 ENCOUNTER — Encounter: Payer: Self-pay | Admitting: Registered Nurse

## 2014-10-19 ENCOUNTER — Encounter: Payer: Commercial Managed Care - HMO | Attending: Physical Medicine & Rehabilitation | Admitting: Registered Nurse

## 2014-10-19 ENCOUNTER — Other Ambulatory Visit: Payer: Self-pay | Admitting: Registered Nurse

## 2014-10-19 VITALS — BP 132/67 | HR 61 | Resp 14

## 2014-10-19 DIAGNOSIS — M79672 Pain in left foot: Secondary | ICD-10-CM | POA: Insufficient documentation

## 2014-10-19 DIAGNOSIS — Z79899 Other long term (current) drug therapy: Secondary | ICD-10-CM | POA: Diagnosis not present

## 2014-10-19 DIAGNOSIS — Z5181 Encounter for therapeutic drug level monitoring: Secondary | ICD-10-CM

## 2014-10-19 DIAGNOSIS — G546 Phantom limb syndrome with pain: Secondary | ICD-10-CM

## 2014-10-19 DIAGNOSIS — G894 Chronic pain syndrome: Secondary | ICD-10-CM

## 2014-10-19 DIAGNOSIS — S78112A Complete traumatic amputation at level between left hip and knee, initial encounter: Secondary | ICD-10-CM

## 2014-10-19 DIAGNOSIS — Z89612 Acquired absence of left leg above knee: Secondary | ICD-10-CM

## 2014-10-19 MED ORDER — OXYCODONE-ACETAMINOPHEN 7.5-325 MG PO TABS: 1.0000 | ORAL_TABLET | Freq: Two times a day (BID) | ORAL | Status: DC | PRN

## 2014-10-19 MED ORDER — GABAPENTIN 300 MG PO CAPS: 300.0000 mg | ORAL_CAPSULE | Freq: Three times a day (TID) | ORAL | Status: DC

## 2014-10-19 NOTE — Telephone Encounter (Signed)
[name] called back and said his gabapentin was 300mg  bid.  [name] gave order to increase to tid.  New order placed and [name] notified.

## 2014-10-19 NOTE — Progress Notes (Signed)
Subjective:    Patient ID: [name], male    DOB: 1934/03/21, 80 y.o.   MRN: [id number]   HPI: [name] is a 80 year old male who returns for follow up for chronic pain and medication refill. He's complaining of phantom pain. He rates his pain 6. Continue Gabapentin, [name] will call  office when he goes home to verify correct dosage of Gabapentin he verbalizes understanding. His current exercise regime is attending [employer] three times a week using the elliptical and performing chair exercises. Also states he's still having problem with his prosthesis and  [employer] sent it to [place] to adjust prosthesis.  [name] also asked about fore-arm crutches, I will speak to Dr. Naaman Plummer and give him a call he verbalizes understanding.  He's using walker in his home. Also has a soft tissue on his left forearm ? [place] encouraged to follow up with his PCP he verbalizes understanding.   Pain Inventory Average Pain 7 Pain Right Now 6 My pain is sharp and stabbing  In the last 24 hours, has pain interfered with the following? General activity 0 Relation with others 0 Enjoyment of life 3 What TIME of day is your pain at its worst? evening,night Sleep (in general) Poor  Pain is worse with: sitting and some activites Pain improves with: rest Relief from Meds: 7  Mobility use a walker ability to climb steps?  yes do you drive?  yes use a wheelchair  Function I need assistance with the following:  household duties  Neuro/Psych bladder control problems  Prior Studies Any changes since last visit?  no  Physicians involved in your care Any changes since last visit?  no   Family History  Problem Relation Age of Onset  . Cancer      breast/fhx  . Heart disease      fhx  . Diabetes Neg Hx   . Cancer Father    Social History   Social History  . Marital Status: Widowed    Spouse Name: N/A  . Number of Children: N/A  . Years of Education: N/A   Occupational  History  . Retired    Social History Main Topics  . Smoking status: Current Some Day Smoker    Types: Cigars  . Smokeless tobacco: Never Used     Comment: 1 cigar per week  . Alcohol Use: 0.0 oz/week    0 Standard drinks or equivalent per week     Comment: less than once a week  . Drug Use: No  . Sexual Activity: Not Currently   Other Topics Concern  . None   Social History Narrative   Lives in [place] with significant other,  [name].   Past Surgical History  Procedure Laterality Date  . Turp vaporization    . Cardiac defibrillator placement  12/26/09    pacemaker combo  . Cervical epidural injection  2013  . Femoral-tibial bypass graft  09/25/2011    Procedure: BYPASS GRAFT FEMORAL-TIBIAL ARTERY;  Surgeon: Mal Misty, MD;  Location: [facility] OR;  Service: Vascular;  Laterality: Left;  Left Femoral - Anterior Tibial Bypass;  saphenous vein graft from left leg  . Intraoperative arteriogram  09/25/2011    Procedure: INTRA OPERATIVE ARTERIOGRAM;  Surgeon: Mal Misty, MD;  Location: [place];  Service: Vascular;  Laterality: Left;  . Femoral-tibial bypass graft  02/07/2012    Procedure: BYPASS GRAFT FEMORAL-TIBIAL ARTERY;  Surgeon: Rosetta Posner, MD;  Location: [facility]  OR;  Service: Vascular;  Laterality: Left;  Thrombectomy Left Femoral - Tibial Bypass Graft  . Embolectomy  02/07/2012    Procedure: EMBOLECTOMY;  Surgeon: Rosetta Posner, MD;  Location: [place];  Service: Vascular;  Laterality: Left;  . Femoral-tibial bypass graft  04/03/2012    Procedure: BYPASS GRAFT FEMORAL-TIBIAL ARTERY;  Surgeon: Mal Misty, MD;  Location: [place];  Service: Vascular;  Laterality: Left;  Redo  . Insert / replace / remove pacemaker  2007  . Coronary angioplasty with stent placement  ~ 2000  . Coronary angioplasty    . Uvulopalatopharyngoplasty (uppp)/tonsillectomy/septoplasty  06/30/2003    Archie Endo 06/30/2003 (07/10/2012)  . Shoulder open rotator cuff repair Right 2001    repair of lacerated right/notes  10/11/1999  (07/10/2012)  . Foot surgery Right 03/20/2001    "have plates and screws in; didn't break it" (07/10/2012)  . Carpal tunnel release Right 2002    Archie Endo 03/20/2001 (07/10/2012)  . Biceps tendon repair Right 2001    Archie Endo 03/20/2001 (07/10/2012)  . Renal artery stent  2009  . Femoral-popliteal bypass graft Left 09/16/2012    Procedure: LEFT FEMORAL-POPLITEAL BYPASS GRAFT WITH GORTEX Propaten Graft 6x80 Thin Wall and Left lower leg Angiogram;  Surgeon: Mal Misty, MD;  Location: [place];  Service: Vascular;  Laterality: Left;  . Colonoscopy      Hx; of  . Tonsillectomy    . Adenoidectomy      Hx; of   . Femoral-tibial bypass graft Left 09/30/2012    Procedure: REDO LEFT FEMORAL-ANTERIOR TIBIAL ARTERY BYPASS USING COMPOSITE CEPHALIC AND BASILIC VEIN GRAFT FROM LEFT ARM;  Surgeon: Mal Misty, MD;  Location: [place];  Service: Vascular;  Laterality: Left;  . I&d extremity Left 10/21/2012    Procedure: EXPLORATION AND DEBRIDEMENT OF LEFT GROIN WOUND;  Surgeon: Mal Misty, MD;  Location: [place];  Service: Vascular;  Laterality: Left;  . Patch angioplasty Left 10/21/2012    Procedure: PATCH ANGIOPLASTY;  Surgeon: Mal Misty, MD;  Location: [place];  Service: Vascular;  Laterality: Left;  . Groin debridement Left 11/12/2012    Procedure: CLOSURE INGUINAL WOUND;  Surgeon: Mal Misty, MD;  Location: [name];  Service: Vascular;  Laterality: Left;  . Embolectomy Left 12/16/2012    Procedure: THROMBECTOMY  LEFT LEG BYPASS;  Surgeon: Elam Dutch, MD;  Location: [place];  Service: Vascular;  Laterality: Left;  . Leg amputation above knee Left 02/04/2013    DR LAWSON  . Amputation Left 02/04/2013    Procedure: AMPUTATION ABOVE KNEE-LEFT;  Surgeon: Mal Misty, MD;  Location: [place];  Service: Vascular;  Laterality: Left;  . Removal of graft Left 04/16/2013    Procedure: I & D LEFT AKA WOUND, POSSIBLE REMOVAL OF INFECTED GORTEX GRAFT;  Surgeon: Mal Misty, MD;  Location: [place];  Service:  Vascular;  Laterality: Left;  . Abdominal aortagram N/A 09/11/2011    Procedure: ABDOMINAL Maxcine Ham;  Surgeon: Wellington Hampshire, MD;  Location: [facility] CATH LAB;  Service: Cardiovascular;  Laterality: N/A;  . Lower extremity angiogram Left 12/25/2011    Procedure: LOWER EXTREMITY ANGIOGRAM;  Surgeon: Serafina Mitchell, MD;  Location: [facility] CATH LAB;  Service: Cardiovascular;  Laterality: Left;  lt leg angio co2  . Abdominal angiogram  12/25/2011    Procedure: ABDOMINAL ANGIOGRAM;  Surgeon: Serafina Mitchell, MD;  Location: [facility] CATH LAB;  Service: Cardiovascular;;  . Lower extremity angiogram N/A 07/07/2012    Procedure: LOWER EXTREMITY  ANGIOGRAM;  Surgeon: Conrad Smeltertown, MD;  Location: [facility] CATH LAB;  Service: Cardiovascular;  Laterality: N/A;  . Lower extremity angiogram N/A 09/15/2012    Procedure: LOWER EXTREMITY ANGIOGRAM;  Surgeon: Serafina Mitchell, MD;  Location: [facility] CATH LAB;  Service: Cardiovascular;  Laterality: N/A;  . Upper extremity angiogram  09/15/2012    Procedure: UPPER EXTREMITY ANGIOGRAM;  Surgeon: Serafina Mitchell, MD;  Location: [facility] CATH LAB;  Service: Cardiovascular;;  . Biv icd genertaor change out N/A 11/09/2013    Procedure: BIV ICD GENERTAOR CHANGE OUT;  Surgeon: Coralyn Mark, MD;  Location: [facility] CATH LAB;  Service: Cardiovascular;  Laterality: N/A;   Past Medical History  Diagnosis Date  . Gout   . Benign prostatic hypertrophy     takes Proscar daily  . Atrial fibrillation     takes Warfarin daily  . Chronic systolic dysfunction of left ventricle     a. mixed ischemic and nonischemic CM,  EF 35%. b. s/p AICD implantation.  . ED (erectile dysfunction)   . Arthritis   . Pacemaker     medtronic  . CAD (coronary artery disease)     a. s/p mid LAD stenting with DES 2008 Dr. Daneen Schick  . ICD (implantable cardiac defibrillator) in place     medtronic, Dr. Rayann Heman  . ICD (implantable cardiac defibrillator) in place   . Sleep apnea     hx of "had surgery for"  . Automatic implantable  cardioverter-defibrillator in situ   . Depression   . Numbness and tingling     Hx; 22f left foot  . PAD (peripheral artery disease)     Severe by PV angiogram 09/2011  . Renal artery stenosis     s/p stenting 2009  . PAD (peripheral artery disease)     s/p multiple LLE bypass grafts; left mid-distal SCA occlusion by 08/2012 duplex  . Hypertension     takes Carvedilol and Losartan daily  . Constipation     takes Miralax daily as needed and Colace daily   . Type II diabetes mellitus     takes Novolog 70/30  . CHF (congestive heart failure)     takes Lasix daily  . History of MRSA infection   . Insomnia     TAKES TRAZODONE NIGHTLY   BP 132/67 mmHg  Pulse 61  Resp 14  SpO2 96%  Opioid Risk Score:   Fall Risk Score:  `1  Depression screen PHQ 2/9  Depression screen PHQ 2/9 06/03/2014  Decreased Interest 2  Down, Depressed, Hopeless 1  PHQ - 2 Score 3  Altered sleeping 3  Tired, decreased energy 2  Change in appetite 0  Feeling bad or failure about yourself  0  Trouble concentrating 1  Moving slowly or fidgety/restless 0  Suicidal thoughts 0  PHQ-9 Score 9     Review of Systems  Genitourinary:       Urinary retention  All other systems reviewed and are negative.      Objective:   Physical Exam  Constitutional: He is oriented to person, place, and time. He appears well-developed and well-nourished.  HENT:  Head: Normocephalic and atraumatic.  Neck: Normal range of motion. Neck supple.  Cardiovascular: Normal rate and regular rhythm.   Pulmonary/Chest: Effort normal and breath sounds normal.  Musculoskeletal:  Normal Muscle Bulk and Muscle Testing Reveals: Upper Extremities: Full ROM and Muscle Strength 5/5 Lower Extremity: Right Full ROM and Muscle Strength 5/5 Left: AKA: Prosthesis on Arrived in  wheelchair  Neurological: He is alert and oriented to person, place, and time.  Skin: Skin is warm and dry.  Psychiatric: He has a normal mood and affect.  Nursing  note and vitals reviewed.         Assessment & Plan:  1. Functional deficits secondary to left AKA.Cointinue with Exercise Regime:  2. Phantom limb pain: Continue Gabapentin Refilled: Percocet 7.5mg  percocet one tablet every 12 hours as needed for breakthrough pain. #60.  30 minutes of face to face patient care time was spent during this visit. All questions were encouraged and answered.

## 2014-10-20 LAB — PRESCRIPTION MONITORING PROFILE (SOLSTAS)
AMPHETAMINE/METH: NEGATIVE ng/mL
BARBITURATE SCREEN, URINE: NEGATIVE ng/mL
Benzodiazepine Screen, Urine: NEGATIVE ng/mL
Buprenorphine, Urine: NEGATIVE ng/mL
CARISOPRODOL, URINE: NEGATIVE ng/mL
CREATININE, URINE: 31.62 mg/dL (ref >20.0)
Cannabinoid Scrn, Ur: NEGATIVE ng/mL
Cocaine Metabolites: NEGATIVE ng/mL
Fentanyl, Ur: NEGATIVE ng/mL
MDMA URINE: NEGATIVE ng/mL
Meperidine, Ur: NEGATIVE ng/mL
Methadone Screen, Urine: NEGATIVE ng/mL
NITRITES URINE, INITIAL: NEGATIVE ug/mL
OXYCODONE SCRN UR: NEGATIVE ng/mL
Opiate Screen, Urine: NEGATIVE ng/mL
PH URINE, INITIAL: 5.3 pH (ref 4.5–8.9)
PROPOXYPHENE: NEGATIVE ng/mL
TRAMADOL UR: NEGATIVE ng/mL
Tapentadol, urine: NEGATIVE ng/mL
Zolpidem, Urine: NEGATIVE ng/mL

## 2014-10-20 LAB — PMP ALCOHOL METABOLITE (ETG): Ethyl Glucuronide (EtG): NEGATIVE ng/mL

## 2014-10-24 ENCOUNTER — Telehealth: Payer: Self-pay | Admitting: *Deleted

## 2014-10-24 ENCOUNTER — Ambulatory Visit (INDEPENDENT_AMBULATORY_CARE_PROVIDER_SITE_OTHER): Payer: Commercial Managed Care - HMO | Admitting: General Practice

## 2014-10-24 DIAGNOSIS — I482 Chronic atrial fibrillation: Secondary | ICD-10-CM | POA: Diagnosis not present

## 2014-10-24 DIAGNOSIS — I4891 Unspecified atrial fibrillation: Secondary | ICD-10-CM

## 2014-10-24 LAB — POCT INR: INR: 3.3

## 2014-10-24 NOTE — Telephone Encounter (Signed)
Patient came in into office with complaints of bump on left elbow. Patient states he woke up last week and bump was there. Bump is roughly 3 inches log, no obvious signs of injury. Vital signs on assessment HR 72 Temp 98.6 O2 97% on RA. Patient is asymptomatic. Site is not tender to touch and causes no pain. Spoke with MD Sarajane Jews and he advised if site changes such as becoming painful, hot, red, or tender to touch, to call in to make appointment. Also if any systemic symptoms occur or if bump remains after a week to call in for appointment. Patient verbalized understanding.

## 2014-10-24 NOTE — Telephone Encounter (Signed)
Patient came into office requesting to change dressing on skin tear on right arm. Tear was superficial with minimal bleeding. Dressing was originally placed by patient at home. 4x4 and tape was applied. Educated patient to watch for bleeding at site and was given additional 4x4 incase bleeding was to occur.

## 2014-10-24 NOTE — Progress Notes (Signed)
Pre visit review using our clinic review tool, if applicable. No additional management support is needed unless otherwise documented below in the visit note. 

## 2014-10-24 NOTE — Telephone Encounter (Signed)
[name] called about forearm crutches. I discussed this with [name]. I called [name] back and told him the plan is to have him be evaluated by physical therapy in regards to the use of forearm crutches. He agrees with the plan.

## 2014-10-24 NOTE — Telephone Encounter (Signed)
Placed a call to Mr. Crampton regarding the forearm crutches. I spoke with Dr. Naaman Plummer he recommends he goes to physical therapy to evaluate his stability with crutches. Awaiting a call back.

## 2014-10-25 NOTE — Telephone Encounter (Signed)
I have call [facility] twice awaiting to speak to Jamey Reas. I called Mr. Schoon left message regarding the above.

## 2014-10-26 ENCOUNTER — Telehealth: Payer: Self-pay | Admitting: Registered Nurse

## 2014-10-26 DIAGNOSIS — S78112A Complete traumatic amputation at level between left hip and knee, initial encounter: Secondary | ICD-10-CM

## 2014-10-26 NOTE — Telephone Encounter (Signed)
[name] aware referral has been placed for PT evaluation for forearm crutches. He verbalizes understanding.

## 2014-10-28 NOTE — Progress Notes (Signed)
Urine drug screen for this encounter is consistent for prescribed medication being absent.  Last dose 10/17/14 and test date 10/19/14.  [name] had #13 percocet at his visit this day.  Uncertain why he had not taken any prior to test

## 2014-11-03 ENCOUNTER — Other Ambulatory Visit: Payer: Self-pay | Admitting: Family Medicine

## 2014-11-11 ENCOUNTER — Telehealth: Payer: Self-pay | Admitting: *Deleted

## 2014-11-11 NOTE — Telephone Encounter (Signed)
[name] called asking for [name]..... It's been a week and a half and he still has not heard back from rehab about his forearm crutch evaluation

## 2014-11-11 NOTE — Telephone Encounter (Signed)
Instructed [name] to call [facility] to follow up. He verbalizes understanding.

## 2014-11-15 ENCOUNTER — Ambulatory Visit (INDEPENDENT_AMBULATORY_CARE_PROVIDER_SITE_OTHER): Payer: Commercial Managed Care - HMO

## 2014-11-15 DIAGNOSIS — Z95810 Presence of automatic (implantable) cardiac defibrillator: Secondary | ICD-10-CM | POA: Diagnosis not present

## 2014-11-15 DIAGNOSIS — I5022 Chronic systolic (congestive) heart failure: Secondary | ICD-10-CM

## 2014-11-17 ENCOUNTER — Telehealth: Payer: Self-pay | Admitting: *Deleted

## 2014-11-17 NOTE — Progress Notes (Signed)
EPIC Encounter for ICM Monitoring  Patient Name: [name] is a 80 y.o. male Date: 11/17/2014 Primary Care Physican: Laurey Morale, MD Primary Cardiologist: Tamala Julian  Electrophysiologist: Allred  Dry Weight: Unknown - not weighing  Bi-V Pacing 99.9%       In the past month, have you:  1. Gained more than 2 pounds in a day or more than 5 pounds in a week? Unknown, does not weigh due to amputation  2. Had changes in your medications (with verification of current medications)? no  3. Had more shortness of breath than is usual for you? no  4. Limited your activity because of shortness of breath? no  5. Not been able to sleep because of shortness of breath? no  6. Had increased swelling in your feet or ankles? no  7. Had symptoms of dehydration (dizziness, dry mouth, increased thirst, decreased urine output) no  8. Had changes in sodium restriction? no  9. Been compliant with medication? Yes   ICM trend:  Follow-up plan: ICM clinic phone appointment 12/19/2014.  Optivol transmission revealed impedance below baseline ~10/27/2014 to 11/15/2014 and he denied any symptoms or changes in diet.  He occasionally has        Swelling in the one let but is resolved by wearing compression stocking.  Reviewed HF symptoms to report.     Copy of note sent to patient's primary care physician, primary cardiologist, and device following physician.  Rosalene Billings, RN, CCM 11/17/2014 2:00 PM

## 2014-11-17 NOTE — Telephone Encounter (Signed)
Pt called stating his phantom limb pain has gone through the roof recently. Pt says he is trying to not rely on his oxycodone. Asking if the are any other solutions

## 2014-11-18 MED ORDER — PREGABALIN 50 MG PO CAPS: ORAL_CAPSULE | ORAL | Status: DC

## 2014-11-18 NOTE — Telephone Encounter (Signed)
Mr Hassan Rowan called back and he is taking his gabapentin.  He would like the lyrica to go to mail order so I told him to not pick [facility] the order from [facility] and I will call to his mail order pharmacy. They requested 90 days so i converted to 90 day supply #180 no RF

## 2014-11-18 NOTE — Telephone Encounter (Addendum)
Left message for [name] to call us back. Lyrica called to his local pharmacy [facility] instead of [facility] order [facility].

## 2014-11-18 NOTE — Telephone Encounter (Signed)
My suggestion would be to try some low dose lyrica (in addition to gabapentin---I assume he's still taking this?)---50mg  qhs for 2 days then bid , #50, 2 RF

## 2014-11-21 ENCOUNTER — Ambulatory Visit (INDEPENDENT_AMBULATORY_CARE_PROVIDER_SITE_OTHER): Payer: Commercial Managed Care - HMO | Admitting: General Practice

## 2014-11-21 DIAGNOSIS — I4891 Unspecified atrial fibrillation: Secondary | ICD-10-CM | POA: Diagnosis not present

## 2014-11-21 DIAGNOSIS — I482 Chronic atrial fibrillation, unspecified: Secondary | ICD-10-CM

## 2014-11-21 LAB — POCT INR: INR: 2.7

## 2014-11-21 NOTE — Progress Notes (Signed)
Pre visit review using our clinic review tool, if applicable. No additional management support is needed unless otherwise documented below in the visit note. 

## 2014-11-22 ENCOUNTER — Telehealth: Payer: Self-pay

## 2014-11-22 NOTE — Telephone Encounter (Signed)
Received voice mail message from member requesting a return call regarding some swelling in hands.    Call to patient.  He stated he has some tingling in hands and swelling in both hands, one side worse than the other.  He denied any type of pain.  He stated he is switching from Gabapentin to Lyrica.  I reviewed side effects of Gabapentin which includes peripheral edema (hands, arms and feet) and also tingling of hands.  He has an appointment with his physician tomorrow and will discuss with him.  Patient also has diabetic neuropathy.

## 2014-11-23 ENCOUNTER — Encounter
Payer: Commercial Managed Care - HMO | Attending: Physical Medicine & Rehabilitation | Admitting: Physical Medicine & Rehabilitation

## 2014-11-23 ENCOUNTER — Encounter: Payer: Self-pay | Admitting: Physical Medicine & Rehabilitation

## 2014-11-23 VITALS — BP 131/69 | HR 61

## 2014-11-23 DIAGNOSIS — Z89612 Acquired absence of left leg above knee: Secondary | ICD-10-CM

## 2014-11-23 DIAGNOSIS — S78112A Complete traumatic amputation at level between left hip and knee, initial encounter: Secondary | ICD-10-CM

## 2014-11-23 DIAGNOSIS — G546 Phantom limb syndrome with pain: Secondary | ICD-10-CM

## 2014-11-23 DIAGNOSIS — M79672 Pain in left foot: Secondary | ICD-10-CM | POA: Insufficient documentation

## 2014-11-23 MED ORDER — OXYCODONE-ACETAMINOPHEN 7.5-325 MG PO TABS: 1.0000 | ORAL_TABLET | Freq: Two times a day (BID) | ORAL | Status: DC | PRN

## 2014-11-23 NOTE — Patient Instructions (Signed)
FOLLOW UP WITH JEFF REGARDING ADJUSTING THE TENSION ON YOUR KNEE TO CONTROL THE FLEXION (BENDING) BETTER WHEN YOU STAND.

## 2014-11-23 NOTE — Progress Notes (Signed)
Subjective:    Patient ID: [name], male    DOB: [birth year], 80 y.o.   MRN: [id number]  HPI   [name] is here in follow up of his left AKA. He is struggling with the leg buckling when he stands. He went to the prosthetist last week who examined him. He walked in the parallel bars without any issues. He typically walks with his walker at home. He is afraid to get up on the leg. The socket is fitting much better apparently though.  He is still having phantom pain which shoot down where his calf/shin used to be. He has not received his lyrica yet from the mail order pharmacy. He remains on gabapentin 200mg  tid   Pain Inventory Average Pain 5 Pain Right Now 7 My pain is sharp and stabbing  In the last 24 hours, has pain interfered with the following? General activity 3 Relation with others 5 Enjoyment of life 5 What TIME of day is your pain at its worst? evening Sleep (in general) Fair  Pain is worse with: sitting and standing Pain improves with: medication Relief from Meds: no answer  Mobility use a walker ability to climb steps?  yes do you drive?  yes use a wheelchair transfers alone  Function I need assistance with the following:  household duties and shopping  Neuro/Psych bladder control problems  Prior Studies Any changes since last visit?  no  Physicians involved in your care Any changes since last visit?  no   Family History  Problem Relation Age of Onset  . Cancer      breast/fhx  . Heart disease      fhx  . Diabetes Neg Hx   . Cancer Father    Social History   Social History  . Marital Status: Widowed    Spouse Name: N/A  . Number of Children: N/A  . Years of Education: N/A   Occupational History  . Retired    Social History Main Topics  . Smoking status: Current Some Day Smoker    Types: Cigars  . Smokeless tobacco: Never Used     Comment: 1 cigar per week  . Alcohol Use: 0.0 oz/week    0 Standard drinks or equivalent per  week     Comment: less than once a week  . Drug Use: No  . Sexual Activity: Not Currently   Other Topics Concern  . None   Social History Narrative   Lives in [place] with significant other,  [name].   Past Surgical History  Procedure Laterality Date  . Turp vaporization    . Cardiac defibrillator placement  12/26/09    pacemaker combo  . Cervical epidural injection  2013  . Femoral-tibial bypass graft  09/25/2011    Procedure: BYPASS GRAFT FEMORAL-TIBIAL ARTERY;  Surgeon: Mal Misty, MD;  [facility] OR;  Service: Vascular;  Laterality: Left;  Left Femoral - Anterior Tibial Bypass;  saphenous vein graft from left leg  . Intraoperative arteriogram  09/25/2011    Procedure: INTRA OPERATIVE ARTERIOGRAM;  Surgeon: Mal Misty, MD;  Location: [facility];  Service: Vascular;  Laterality: Left;  . Femoral-tibial bypass graft  02/07/2012    Procedure: BYPASS GRAFT FEMORAL-TIBIAL ARTERY;  Surgeon: Rosetta Posner, MD;  Location: [facility];  Service: Vascular;  Laterality: Left;  Thrombectomy Left Femoral - Tibial Bypass Graft  . Embolectomy  02/07/2012    Procedure: EMBOLECTOMY;  Surgeon: Rosetta Posner, MD;  Location: [facility];  Service: Vascular;  Laterality: Left;  . Femoral-tibial bypass graft  04/03/2012    Procedure: BYPASS GRAFT FEMORAL-TIBIAL ARTERY;  Surgeon: Mal Misty, MD;  Location: [facility];  Service: Vascular;  Laterality: Left;  Redo  . Insert / replace / remove pacemaker  2007  . Coronary angioplasty with stent placement  ~ 2000  . Coronary angioplasty    . Uvulopalatopharyngoplasty (uppp)/tonsillectomy/septoplasty  06/30/2003    Archie Endo 06/30/2003 (07/10/2012)  . Shoulder open rotator cuff repair Right 2001    repair of lacerated right/notes 10/11/1999  (07/10/2012)  . Foot surgery Right 03/20/2001    "have plates and screws in; didn't break it" (07/10/2012)  . Carpal tunnel release Right 2002    Archie Endo 03/20/2001 (07/10/2012)  . Biceps tendon repair Right 2001    Archie Endo 03/20/2001 (07/10/2012)    . Renal artery stent  2009  . Femoral-popliteal bypass graft Left 09/16/2012    Procedure: LEFT FEMORAL-POPLITEAL BYPASS GRAFT WITH GORTEX Propaten Graft 6x80 Thin Wall and Left lower leg Angiogram;  Surgeon: Mal Misty, MD;  Location: [facility];  Service: Vascular;  Laterality: Left;  . Colonoscopy      Hx; of  . Tonsillectomy    . Adenoidectomy      Hx; of   . Femoral-tibial bypass graft Left 09/30/2012    Procedure: REDO LEFT FEMORAL-ANTERIOR TIBIAL ARTERY BYPASS USING COMPOSITE CEPHALIC AND BASILIC VEIN GRAFT FROM LEFT ARM;  Surgeon: Mal Misty, MD;  Location: [facility];  Service: Vascular;  Laterality: Left;  . I&d extremity Left 10/21/2012    Procedure: EXPLORATION AND DEBRIDEMENT OF LEFT GROIN WOUND;  Surgeon: Mal Misty, MD;  Location: [facility];  Service: Vascular;  Laterality: Left;  . Patch angioplasty Left 10/21/2012    Procedure: PATCH ANGIOPLASTY;  Surgeon: Mal Misty, MD;  Location: [facility];  Service: Vascular;  Laterality: Left;  . Groin debridement Left 11/12/2012    Procedure: CLOSURE INGUINAL WOUND;  Surgeon: Mal Misty, MD;  Location: [facility];  Service: Vascular;  Laterality: Left;  . Embolectomy Left 12/16/2012    Procedure: THROMBECTOMY  LEFT LEG BYPASS;  Surgeon: Elam Dutch, MD;  Location: [facility];  Service: Vascular;  Laterality: Left;  . Leg amputation above knee Left 02/04/2013    DR LAWSON  . Amputation Left 02/04/2013    Procedure: AMPUTATION ABOVE KNEE-LEFT;  Surgeon: Mal Misty, MD;  Location: [facility];  Service: Vascular;  Laterality: Left;  . Removal of graft Left 04/16/2013    Procedure: I & D LEFT AKA WOUND, POSSIBLE REMOVAL OF INFECTED GORTEX GRAFT;  Surgeon: Mal Misty, MD;  Location: [facility];  Service: Vascular;  Laterality: Left;  . Abdominal aortagram N/A 09/11/2011    Procedure: ABDOMINAL Maxcine Ham;  Surgeon: Wellington Hampshire, MD;  Location: [facility] CATH LAB;  Service: Cardiovascular;  Laterality: N/A;  . Lower extremity angiogram Left 12/25/2011     Procedure: LOWER EXTREMITY ANGIOGRAM;  Surgeon: Serafina Mitchell, MD;  Location: [facility] CATH LAB;  Service: Cardiovascular;  Laterality: Left;  lt leg angio co2  . Abdominal angiogram  12/25/2011    Procedure: ABDOMINAL ANGIOGRAM;  Surgeon: Serafina Mitchell, MD;  Location: [facility] CATH LAB;  Service: Cardiovascular;;  . Lower extremity angiogram N/A 07/07/2012    Procedure: LOWER EXTREMITY ANGIOGRAM;  Surgeon: Conrad Buffalo, MD;  Location: [facility] CATH LAB;  Service: Cardiovascular;  Laterality: N/A;  . Lower extremity angiogram N/A 09/15/2012    Procedure: LOWER EXTREMITY ANGIOGRAM;  Surgeon: Durene Fruits  Pierre Bali, MD;  Location: [facility] CATH LAB;  Service: Cardiovascular;  Laterality: N/A;  . Upper extremity angiogram  09/15/2012    Procedure: UPPER EXTREMITY ANGIOGRAM;  Surgeon: Serafina Mitchell, MD;  Location: [facility] CATH LAB;  Service: Cardiovascular;;  . Biv icd genertaor change out N/A 11/09/2013    Procedure: BIV ICD GENERTAOR CHANGE OUT;  Surgeon: Coralyn Mark, MD;  Location: [facility] CATH LAB;  Service: Cardiovascular;  Laterality: N/A;   Past Medical History  Diagnosis Date  . Gout   . Benign prostatic hypertrophy     takes Proscar daily  . Atrial fibrillation     takes Warfarin daily  . Chronic systolic dysfunction of left ventricle     a. mixed ischemic and nonischemic CM,  EF 35%. b. s/p AICD implantation.  . ED (erectile dysfunction)   . Arthritis   . Pacemaker     medtronic  . CAD (coronary artery disease)     a. s/p mid LAD stenting with DES 2008 Dr. Daneen Schick  . ICD (implantable cardiac defibrillator) in place     medtronic, Dr. Rayann Heman  . ICD (implantable cardiac defibrillator) in place   . Sleep apnea     hx of "had surgery for"  . Automatic implantable cardioverter-defibrillator in situ   . Depression   . Numbness and tingling     Hx; 49f left foot  . PAD (peripheral artery disease)     Severe by PV angiogram 09/2011  . Renal artery stenosis     s/p stenting 2009  . PAD (peripheral artery disease)       s/p multiple LLE bypass grafts; left mid-distal SCA occlusion by 08/2012 duplex  . Hypertension     takes Carvedilol and Losartan daily  . Constipation     takes Miralax daily as needed and Colace daily   . Type II diabetes mellitus     takes Novolog 70/30  . CHF (congestive heart failure)     takes Lasix daily  . History of MRSA infection   . Insomnia     TAKES TRAZODONE NIGHTLY   BP 131/69 mmHg  Pulse 61  SpO2 98%  Opioid Risk Score:   Fall Risk Score:  `1  Depression screen PHQ 2/9  Depression screen [facility] 2/9 11/23/2014 06/03/2014  Decreased Interest 2 2  Down, Depressed, Hopeless 1 1  PHQ - 2 Score 3 3  Altered sleeping - 3  Tired, decreased energy - 2  Change in appetite - 0  Feeling bad or failure about yourself  - 0  Trouble concentrating - 1  Moving slowly or fidgety/restless - 0  Suicidal thoughts - 0  PHQ-9 Score - 9     Review of Systems  Genitourinary:       Bladder control problems  All other systems reviewed and are negative.      Objective:   Physical Exam  Constitutional: He is oriented to person, place, and time. He appears well-developed and well-nourished.  HENT:  Head: Normocephalic and atraumatic.  Eyes: Conjunctivae are normal. Pupils are equal, round, and reactive to light.  Neck: Neck supple.  Cardiovascular: Normal rate and regular rhythm. No rubs, gallops. Pacer site dressed  No murmur heard.  Respiratory: Effort normal and breath sounds normal. No respiratory distress. He has no wheezes.  GI: Soft. Bowel sounds are normal. He exhibits no distension. There is no tenderness.  Musculoskeletal:  L-AKA prosthesis socket fitting appropriately. Knee flexion resistance is mild.    Neurological:  He is alert and oriented to person, place, and time.  Follows commands. Strength grossly intact in UE's at 5/5. RLE is 4/5 prox to 5/5 distally. Left HF 4/5. mild sensory loss in distal right leg/foot. Cognitivley-linguistically he's at baseline.   Skin: Intact.  Psychiatric: He has a normal mood and affect. His speech is normal and behavior is normal. Judgment and thought content normal. Cognition and memory are normal.    Assessment/Plan:  1. Functional deficits secondary to left AKA.  2. Phantom limb pain---   -continue gabapentin 200mg  tid -awaiting lyrica rx, begin 50mg  hs and increase to bid.  -continue 7.5mg  percocet for breakthrough pain  -follow up with [facility] regarding prosthetic knee control. i contacted the prosthetist personally and discussed the problem today.  3. Follow up with me in about 2 months . 15 minutes of face to face patient care time were spent during this visit. All questions were encouraged and answered.  4. Gout---allopurinol

## 2014-11-25 ENCOUNTER — Other Ambulatory Visit: Payer: Self-pay | Admitting: Endocrinology

## 2014-11-25 ENCOUNTER — Telehealth: Payer: Self-pay | Admitting: Family Medicine

## 2014-11-25 NOTE — Telephone Encounter (Signed)
Pt request refill of the following: glucose blood (ONE TOUCH ULTRA TEST) test strip   [facility] [place] [facility] [address]

## 2014-11-29 MED ORDER — GLUCOSE BLOOD VI STRP: ORAL_STRIP | Status: DC

## 2014-11-29 NOTE — Telephone Encounter (Signed)
I sent script e-scribe. 

## 2014-12-07 ENCOUNTER — Telehealth: Payer: Self-pay

## 2014-12-07 NOTE — Telephone Encounter (Signed)
Patient left voice mail message to return call.  Spoke with patient and he asked which date is his next transmission.  Confirmed date of 12/19/2014.

## 2014-12-13 ENCOUNTER — Telehealth: Payer: Self-pay | Admitting: Physical Medicine & Rehabilitation

## 2014-12-13 NOTE — Telephone Encounter (Signed)
Patient called needing new order  for [facility], his leg keeps falling off and needs new measurements to make a new socket (patient has lost some weight).  Please call him at [phone] or cell phone [phone].

## 2014-12-13 NOTE — Telephone Encounter (Signed)
We need to send [facility] a new script for a replacement socket. Please remind me tomorrow. thanks

## 2014-12-14 ENCOUNTER — Ambulatory Visit: Payer: Commercial Managed Care - HMO | Attending: Registered Nurse | Admitting: Physical Therapy

## 2014-12-14 DIAGNOSIS — Z89612 Acquired absence of left leg above knee: Secondary | ICD-10-CM | POA: Insufficient documentation

## 2014-12-14 DIAGNOSIS — R269 Unspecified abnormalities of gait and mobility: Secondary | ICD-10-CM | POA: Insufficient documentation

## 2014-12-14 NOTE — Therapy (Signed)
[facility] [address] [place], [place], [address] Phone: [phone]   Fax:  [phone]  Patient Details  Name: [name] MRN: [id number] Date of Birth: 05-03-1934 Referring Provider:  Bayard Hugger, NP  Encounter Date: 12/14/2014  Patient is familiar to this PT from previous episodes of care.  He reports that he thinks PT referral is not correct at this time. His prosthesis is "falling off" and he is not able to control the prosthetic knee. He also reports the weight of the prosthesis is limiting his activity level in addition to these issues with suspension and knee control. Patient initially appeared to have potential for K3 (full community level with variable cadence) function. So the current prosthesis was prescribed based on that functional potential.  He currently is functioning at K2 (limited community distances with fixed cadence) level. His current prosthesis is C-leg which is heavier with hydraulic component. His residual limb has decreased volume which is typical with initial wear of prosthesis and now his socket is too large compared to limb size. This causes loss of suction suspension. PT recommends different suspension mechanism of velcro lanyard which is easier to accommodate small volume fluctuations which is possible with vascular issues including history of CHF.  Prosthetic knees need to have extension to create stability in stance but flexion for swing clearance. Balancing the ability to have knee extension for stance & flexion for swing is the biggest issue for this patient. If you increase the resistance to improve stance stability then it is too difficult to bend to advance it in swing. This patient would benefit from lighter knee unit that has optimal stability in stance and easy flexion for swing. A K2 level Total Knee would be best solution. A Total Knee is lighter with "lock" with extension/ heel  pressure at initial contact and flexion with toe pressure at terminal stance and suspension of silicon gel liner with velcro lanyard.    Gerardo Caiazzo PT, DPT 12/14/2014, 10:03 AM  [facility] [address] [place], [place], [address] Phone: [phone]   Fax:  [phone]

## 2014-12-14 NOTE — Telephone Encounter (Signed)
Script faxed 12/14/14, transmission successful

## 2014-12-19 ENCOUNTER — Telehealth: Payer: Self-pay

## 2014-12-19 ENCOUNTER — Ambulatory Visit (INDEPENDENT_AMBULATORY_CARE_PROVIDER_SITE_OTHER): Payer: Commercial Managed Care - HMO | Admitting: *Deleted

## 2014-12-19 DIAGNOSIS — I5022 Chronic systolic (congestive) heart failure: Secondary | ICD-10-CM

## 2014-12-19 DIAGNOSIS — Z95810 Presence of automatic (implantable) cardiac defibrillator: Secondary | ICD-10-CM

## 2014-12-19 DIAGNOSIS — I255 Ischemic cardiomyopathy: Secondary | ICD-10-CM | POA: Diagnosis not present

## 2014-12-19 NOTE — Progress Notes (Signed)
Remote ICD transmission.   

## 2014-12-19 NOTE — Telephone Encounter (Signed)
Spoke with patient.

## 2014-12-19 NOTE — Telephone Encounter (Signed)
ICM transmission received.  Attempted patient call and left message for return call.  

## 2014-12-19 NOTE — Progress Notes (Signed)
EPIC Encounter for ICM Monitoring  Patient Name: [name] is a 81 y.o. male Date: 12/19/2014 Primary Care Physican: Laurey Morale, MD Primary Cardiologist: Tamala Julian Electrophysiologist: Allred Dry Weight: Does not weigh at home   Bi-V Pacing 99.9%       In the past month, have you:  1. Gained more than 2 pounds in a day or more than 5 pounds in a week? no  2. Had changes in your medications (with verification of current medications)? no  3. Had more shortness of breath than is usual for you? no  4. Limited your activity because of shortness of breath? no  5. Not been able to sleep because of shortness of breath? no  6. Had increased swelling in your feet or ankles? no  7. Had symptoms of dehydration (dizziness, dry mouth, increased thirst, decreased urine output) no  8. Had changes in sodium restriction? no  9. Been compliant with medication? Yes   ICM trend:   Follow-up plan: ICM clinic phone appointment on 01/19/2015.  Optivol impedance trending long baseline and patient denied any symptoms.  He reported his main problem is his hands and arms having tingling and some pain.  He is working with his physician to resolve.  No changes today.  Copy of note sent to patient's primary care physician, primary cardiologist, and device following physician.  Rosalene Billings, RN, CCM 12/19/2014 3:54 PM

## 2014-12-20 ENCOUNTER — Encounter: Payer: Self-pay | Admitting: Cardiology

## 2014-12-20 LAB — CUP PACEART REMOTE DEVICE CHECK
Battery Remaining Longevity: 97 mo
Battery Voltage: 2.99 V
Brady Statistic AS VP Percent: 99.87 %
Brady Statistic AS VS Percent: 0.13 %
HIGH POWER IMPEDANCE MEASURED VALUE: 45 Ohm
HIGH POWER IMPEDANCE MEASURED VALUE: 58 Ohm
Implantable Lead Implant Date: 20111024
Implantable Lead Implant Date: 20111024
Implantable Lead Location: 753858
Implantable Lead Location: 753860
Implantable Lead Model: 4196
Implantable Lead Model: 5076
Lead Channel Impedance Value: 494 Ohm
Lead Channel Impedance Value: 551 Ohm
Lead Channel Pacing Threshold Amplitude: 0.625 V
Lead Channel Pacing Threshold Amplitude: 1 V
Lead Channel Pacing Threshold Pulse Width: 0.4 ms
Lead Channel Pacing Threshold Pulse Width: 0.4 ms
Lead Channel Sensing Intrinsic Amplitude: 17.875 mV
Lead Channel Setting Pacing Amplitude: 2.5 V
Lead Channel Setting Pacing Pulse Width: 0.4 ms
Lead Channel Setting Sensing Sensitivity: 0.3 mV
MDC IDC LEAD IMPLANT DT: 20111024
MDC IDC LEAD LOCATION: 753859
MDC IDC MSMT LEADCHNL LV IMPEDANCE VALUE: 551 Ohm
MDC IDC MSMT LEADCHNL LV IMPEDANCE VALUE: 570 Ohm
MDC IDC MSMT LEADCHNL LV IMPEDANCE VALUE: 950 Ohm
MDC IDC MSMT LEADCHNL RA IMPEDANCE VALUE: 380 Ohm
MDC IDC MSMT LEADCHNL RA SENSING INTR AMPL: 0.125 mV
MDC IDC SESS DTM: 20161017135958
MDC IDC SET LEADCHNL LV PACING AMPLITUDE: 2 V
MDC IDC SET LEADCHNL LV PACING PULSEWIDTH: 0.4 ms
MDC IDC STAT BRADY AP VP PERCENT: 0 %
MDC IDC STAT BRADY AP VS PERCENT: 0 %
MDC IDC STAT BRADY RA PERCENT PACED: 0 %
MDC IDC STAT BRADY RV PERCENT PACED: 99.91 %

## 2014-12-23 ENCOUNTER — Telehealth: Payer: Self-pay | Admitting: Family Medicine

## 2014-12-23 ENCOUNTER — Encounter: Payer: Self-pay | Admitting: Vascular Surgery

## 2014-12-23 NOTE — Telephone Encounter (Signed)
Error

## 2014-12-26 ENCOUNTER — Telehealth: Payer: Self-pay | Admitting: Physical Medicine & Rehabilitation

## 2014-12-27 ENCOUNTER — Ambulatory Visit (INDEPENDENT_AMBULATORY_CARE_PROVIDER_SITE_OTHER)
Admission: RE | Admit: 2014-12-27 | Discharge: 2014-12-27 | Disposition: A | Discharge status: Home / Self Care | Payer: Commercial Managed Care - HMO | Source: Ambulatory Visit | Attending: Vascular Surgery | Admitting: Vascular Surgery

## 2014-12-27 ENCOUNTER — Ambulatory Visit (HOSPITAL_COMMUNITY)
Admission: RE | Admit: 2014-12-27 | Discharge: 2014-12-27 | Disposition: A | Discharge status: Home / Self Care | Payer: Commercial Managed Care - HMO | Source: Ambulatory Visit | Attending: Vascular Surgery | Admitting: Vascular Surgery

## 2014-12-27 ENCOUNTER — Encounter: Payer: Commercial Managed Care - HMO | Admitting: Vascular Surgery

## 2014-12-27 DIAGNOSIS — I739 Peripheral vascular disease, unspecified: Secondary | ICD-10-CM | POA: Insufficient documentation

## 2014-12-27 DIAGNOSIS — I6523 Occlusion and stenosis of bilateral carotid arteries: Secondary | ICD-10-CM

## 2014-12-27 DIAGNOSIS — Z95828 Presence of other vascular implants and grafts: Secondary | ICD-10-CM | POA: Diagnosis not present

## 2014-12-27 DIAGNOSIS — Z48812 Encounter for surgical aftercare following surgery on the circulatory system: Secondary | ICD-10-CM | POA: Insufficient documentation

## 2014-12-28 NOTE — Telephone Encounter (Signed)
I talked to [name] and explained that Dr. Naaman Plummer was out on family emergency and that he will be back Friday. I explained that we can address the need for a scooter on Friday

## 2014-12-30 NOTE — Telephone Encounter (Signed)
The scooter company will need to contact us with what exactly they need.

## 2015-01-02 ENCOUNTER — Telehealth: Payer: Self-pay | Admitting: Physical Medicine & Rehabilitation

## 2015-01-02 NOTE — Telephone Encounter (Signed)
Patient reached out to scooter place but they will not call us, they would like for [place] to fax the order over to [phone] ([facility])

## 2015-01-02 NOTE — Telephone Encounter (Signed)
Spoke with pt. Pt. Advised that he will need to contact scooter company and have them reach out to [place]. Pt. Verbalized understanding. [facility]

## 2015-01-02 NOTE — Progress Notes (Signed)
A user error has taken place: encounter opened in error, closed for administrative reasons.

## 2015-01-04 ENCOUNTER — Encounter: Payer: Self-pay | Admitting: Cardiology

## 2015-01-05 ENCOUNTER — Ambulatory Visit: Payer: Commercial Managed Care - HMO

## 2015-01-12 ENCOUNTER — Ambulatory Visit: Payer: Commercial Managed Care - HMO

## 2015-01-16 ENCOUNTER — Ambulatory Visit: Payer: Commercial Managed Care - HMO

## 2015-01-16 ENCOUNTER — Ambulatory Visit (INDEPENDENT_AMBULATORY_CARE_PROVIDER_SITE_OTHER): Payer: Commercial Managed Care - HMO | Admitting: General Practice

## 2015-01-16 DIAGNOSIS — I4891 Unspecified atrial fibrillation: Secondary | ICD-10-CM | POA: Diagnosis not present

## 2015-01-16 DIAGNOSIS — Z5181 Encounter for therapeutic drug level monitoring: Secondary | ICD-10-CM

## 2015-01-16 LAB — POCT INR: INR: 2.3

## 2015-01-16 NOTE — Progress Notes (Signed)
Pre visit review using our clinic review tool, if applicable. No additional management support is needed unless otherwise documented below in the visit note. 

## 2015-01-19 ENCOUNTER — Ambulatory Visit (INDEPENDENT_AMBULATORY_CARE_PROVIDER_SITE_OTHER): Payer: Commercial Managed Care - HMO

## 2015-01-19 DIAGNOSIS — Z95810 Presence of automatic (implantable) cardiac defibrillator: Secondary | ICD-10-CM

## 2015-01-19 DIAGNOSIS — I5022 Chronic systolic (congestive) heart failure: Secondary | ICD-10-CM | POA: Diagnosis not present

## 2015-01-20 ENCOUNTER — Telehealth: Payer: Self-pay

## 2015-01-20 NOTE — Telephone Encounter (Signed)
ICM transmission received.  Attempted patient call and he was busy.  Will attempt call back.

## 2015-01-23 ENCOUNTER — Encounter: Payer: Self-pay | Admitting: Physical Medicine & Rehabilitation

## 2015-01-23 ENCOUNTER — Encounter
Payer: Commercial Managed Care - HMO | Attending: Physical Medicine & Rehabilitation | Admitting: Physical Medicine & Rehabilitation

## 2015-01-23 ENCOUNTER — Other Ambulatory Visit: Payer: Self-pay | Admitting: Physical Medicine & Rehabilitation

## 2015-01-23 VITALS — BP 130/54 | HR 71

## 2015-01-23 DIAGNOSIS — G546 Phantom limb syndrome with pain: Secondary | ICD-10-CM

## 2015-01-23 DIAGNOSIS — Z89612 Acquired absence of left leg above knee: Secondary | ICD-10-CM

## 2015-01-23 DIAGNOSIS — M79672 Pain in left foot: Secondary | ICD-10-CM | POA: Insufficient documentation

## 2015-01-23 DIAGNOSIS — S78112A Complete traumatic amputation at level between left hip and knee, initial encounter: Secondary | ICD-10-CM

## 2015-01-23 MED ORDER — OXYCODONE-ACETAMINOPHEN 10-325 MG PO TABS: 1.0000 | ORAL_TABLET | ORAL | Status: DC | PRN

## 2015-01-23 NOTE — Patient Instructions (Signed)
PLEASE CALL ME WITH ANY PROBLEMS OR QUESTIONS (#[phone]). HAVE A HAPPY HOLIDAY SEASON!!!    

## 2015-01-23 NOTE — Progress Notes (Signed)
Subjective:    Patient ID: [name], male    DOB: 1934/04/28, 80 y.o.   MRN: [id number]  HPI   He is back regarding his left AKA. He is still having phantom pain. It is most severe at night. He just received his new prosthesis but it needs adjustments. His oxycodone helps somewhat with pain. He did not want to pursue the lyrica. He continues on gabapentin as prescribed.  He is supposed to have therapy on 11/29 to begin walking on his new limb.   He reports no other medical changes. He wants to go up to [facility] next month for christmas but is apprehensive about the trip, going to the airport, etc.   Pain Inventory Average Pain 6 Pain Right Now 5 My pain is sharp, stabbing and aching  In the last 24 hours, has pain interfered with the following? General activity 5 Relation with others 6 Enjoyment of life 8 What TIME of day is your pain at its worst? evening Sleep (in general) n/a  Pain is worse with: sitting and standing Pain improves with: medication Relief from Meds: 9  Mobility use a walker ability to climb steps?  no do you drive?  yes use a wheelchair transfers alone Do you have any goals in this area?  yes  Function disabled: date disabled .  Neuro/Psych tremor depression  Prior Studies Any changes since last visit?  no  Physicians involved in your care Any changes since last visit?  no   Family History  Problem Relation Age of Onset  . Cancer      breast/fhx  . Heart disease      fhx  . Diabetes Neg Hx   . Cancer Father    Social History   Social History  . Marital Status: Widowed    Spouse Name: N/A  . Number of Children: N/A  . Years of Education: N/A   Occupational History  . Retired    Social History Main Topics  . Smoking status: Current Some Day Smoker    Types: Cigars  . Smokeless tobacco: Never Used     Comment: 1 cigar per week  . Alcohol Use: 0.0 oz/week    0 Standard drinks or equivalent per week     Comment: less than  once a week  . Drug Use: No  . Sexual Activity: Not Currently   Other Topics Concern  . None   Social History Narrative   Lives in [place] with significant other,  [name].   Past Surgical History  Procedure Laterality Date  . Turp vaporization    . Cardiac defibrillator placement  12/26/09    pacemaker combo  . Cervical epidural injection  2013  . Femoral-tibial bypass graft  09/25/2011    Procedure: BYPASS GRAFT FEMORAL-TIBIAL ARTERY;  Surgeon: Mal Misty, MD;  Location: [facility] OR;  Service: Vascular;  Laterality: Left;  Left Femoral - Anterior Tibial Bypass;  saphenous vein graft from left leg  . Intraoperative arteriogram  09/25/2011    Procedure: INTRA OPERATIVE ARTERIOGRAM;  Surgeon: Mal Misty, MD;  Location: [place];  Service: Vascular;  Laterality: Left;  . Femoral-tibial bypass graft  02/07/2012    Procedure: BYPASS GRAFT FEMORAL-TIBIAL ARTERY;  Surgeon: Rosetta Posner, MD;  Location: [place];  Service: Vascular;  Laterality: Left;  Thrombectomy Left Femoral - Tibial Bypass Graft  . Embolectomy  02/07/2012    Procedure: EMBOLECTOMY;  Surgeon: Rosetta Posner, MD;  Location: [place];  Service:  Vascular;  Laterality: Left;  . Femoral-tibial bypass graft  04/03/2012    Procedure: BYPASS GRAFT FEMORAL-TIBIAL ARTERY;  Surgeon: Mal Misty, MD;  Location: [place];  Service: Vascular;  Laterality: Left;  Redo  . Insert / replace / remove pacemaker  2007  . Coronary angioplasty with stent placement  ~ 2000  . Coronary angioplasty    . Uvulopalatopharyngoplasty (uppp)/tonsillectomy/septoplasty  06/30/2003    Archie Endo 06/30/2003 (07/10/2012)  . Shoulder open rotator cuff repair Right 2001    repair of lacerated right/notes 10/11/1999  (07/10/2012)  . Foot surgery Right 03/20/2001    "have plates and screws in; didn't break it" (07/10/2012)  . Carpal tunnel release Right 2002    Archie Endo 03/20/2001 (07/10/2012)  . Biceps tendon repair Right 2001    Archie Endo 03/20/2001 (07/10/2012)  . Renal artery stent  2009    . Femoral-popliteal bypass graft Left 09/16/2012    Procedure: LEFT FEMORAL-POPLITEAL BYPASS GRAFT WITH GORTEX Propaten Graft 6x80 Thin Wall and Left lower leg Angiogram;  Surgeon: Mal Misty, MD;  Location: [place];  Service: Vascular;  Laterality: Left;  . Colonoscopy      Hx; of  . Tonsillectomy    . Adenoidectomy      Hx; of   . Femoral-tibial bypass graft Left 09/30/2012    Procedure: REDO LEFT FEMORAL-ANTERIOR TIBIAL ARTERY BYPASS USING COMPOSITE CEPHALIC AND BASILIC VEIN GRAFT FROM LEFT ARM;  Surgeon: Mal Misty, MD;  Location: [place];  Service: Vascular;  Laterality: Left;  . I&d extremity Left 10/21/2012    Procedure: EXPLORATION AND DEBRIDEMENT OF LEFT GROIN WOUND;  Surgeon: Mal Misty, MD;  Location: [place];  Service: Vascular;  Laterality: Left;  . Patch angioplasty Left 10/21/2012    Procedure: PATCH ANGIOPLASTY;  Surgeon: Mal Misty, MD;  Location: [place];  Service: Vascular;  Laterality: Left;  . Groin debridement Left 11/12/2012    Procedure: CLOSURE INGUINAL WOUND;  Surgeon: Mal Misty, MD;  Location: [place];  Service: Vascular;  Laterality: Left;  . Embolectomy Left 12/16/2012    Procedure: THROMBECTOMY  LEFT LEG BYPASS;  Surgeon: Elam Dutch, MD;  Location: [place];  Service: Vascular;  Laterality: Left;  . Leg amputation above knee Left 02/04/2013    DR LAWSON  . Amputation Left 02/04/2013    Procedure: AMPUTATION ABOVE KNEE-LEFT;  Surgeon: Mal Misty, MD;  Location: [place];  Service: Vascular;  Laterality: Left;  . Removal of graft Left 04/16/2013    Procedure: I & D LEFT AKA WOUND, POSSIBLE REMOVAL OF INFECTED GORTEX GRAFT;  Surgeon: Mal Misty, MD;  Location: [place];  Service: Vascular;  Laterality: Left;  . Abdominal aortagram N/A 09/11/2011    Procedure: ABDOMINAL Maxcine Ham;  Surgeon: Wellington Hampshire, MD;  Location: [facility] CATH LAB;  Service: Cardiovascular;  Laterality: N/A;  . Lower extremity angiogram Left 12/25/2011    Procedure: LOWER EXTREMITY  ANGIOGRAM;  Surgeon: Serafina Mitchell, MD;  Location: [facility] CATH LAB;  Service: Cardiovascular;  Laterality: Left;  lt leg angio co2  . Abdominal angiogram  12/25/2011    Procedure: ABDOMINAL ANGIOGRAM;  Surgeon: Serafina Mitchell, MD;  Location: [facility] CATH LAB;  Service: Cardiovascular;;  . Lower extremity angiogram N/A 07/07/2012    Procedure: LOWER EXTREMITY ANGIOGRAM;  Surgeon: Conrad Perry, MD;  Location: [facility] CATH LAB;  Service: Cardiovascular;  Laterality: N/A;  . Lower extremity angiogram N/A 09/15/2012    Procedure: LOWER EXTREMITY ANGIOGRAM;  Surgeon: Butch Penny  Trula Slade, MD;  Location: [place] CATH LAB;  Service: Cardiovascular;  Laterality: N/A;  . Upper extremity angiogram  09/15/2012    Procedure: UPPER EXTREMITY ANGIOGRAM;  Surgeon: Serafina Mitchell, MD;  Location: [facility] CATH LAB;  Service: Cardiovascular;;  . Biv icd genertaor change out N/A 11/09/2013    Procedure: BIV ICD GENERTAOR CHANGE OUT;  Surgeon: Coralyn Mark, MD;  Location: [facility] CATH LAB;  Service: Cardiovascular;  Laterality: N/A;   Past Medical History  Diagnosis Date  . Gout   . Benign prostatic hypertrophy     takes Proscar daily  . Atrial fibrillation ([place])     takes Warfarin daily  . Chronic systolic dysfunction of left ventricle     a. mixed ischemic and nonischemic CM,  EF 35%. b. s/p AICD implantation.  . ED (erectile dysfunction)   . Arthritis   . Pacemaker     medtronic  . CAD (coronary artery disease)     a. s/p mid LAD stenting with DES 2008 Dr. Daneen Schick  . ICD (implantable cardiac defibrillator) in place     medtronic, Dr. Rayann Heman  . ICD (implantable cardiac defibrillator) in place   . Sleep apnea     hx of "had surgery for"  . Automatic implantable cardioverter-defibrillator in situ   . Depression   . Numbness and tingling     Hx; 63f left foot  . PAD (peripheral artery disease) (HCC)     Severe by PV angiogram 09/2011  . Renal artery stenosis ([place])     s/p stenting 2009  . PAD (peripheral artery disease) (HCC)      s/p multiple LLE bypass grafts; left mid-distal SCA occlusion by 08/2012 duplex  . Hypertension     takes Carvedilol and Losartan daily  . Constipation     takes Miralax daily as needed and Colace daily   . Type II diabetes mellitus (HCC)     takes Novolog 70/30  . CHF (congestive heart failure) (HCC)     takes Lasix daily  . History of MRSA infection   . Insomnia     TAKES TRAZODONE NIGHTLY   BP 130/54 mmHg  Pulse 71  SpO2 97%  Opioid Risk Score:   Fall Risk Score:  `1  Depression screen PHQ 2/9  Depression screen [facility] 2/9 11/23/2014 06/03/2014  Decreased Interest 2 2  Down, Depressed, Hopeless 1 1  PHQ - 2 Score 3 3  Altered sleeping - 3  Tired, decreased energy - 2  Change in appetite - 0  Feeling bad or failure about yourself  - 0  Trouble concentrating - 1  Moving slowly or fidgety/restless - 0  Suicidal thoughts - 0  PHQ-9 Score - 9       Review of Systems  Neurological:       Tremors  Psychiatric/Behavioral: Positive for dysphoric mood.  All other systems reviewed and are negative.      Objective:   Physical Exam  Constitutional: He is oriented to person, place, and time. He appears well-developed and well-nourished.  HENT:  Head: Normocephalic and atraumatic.  Eyes: Conjunctivae are normal. Pupils are equal, round, and reactive to light.  Neck: Neck supple.  Cardiovascular: Normal rate and regular rhythm. No rubs, gallops. Pacer site dressed  No murmur heard.  Respiratory: Effort normal and breath sounds normal. No respiratory distress. He has no wheezes.  GI: Soft. Bowel sounds are normal. He exhibits no distension. There is no tenderness.  Musculoskeletal:  L-AKA prosthesis socket in  place.    Neurological: He is alert and oriented to person, place, and time.  Follows commands. Strength grossly intact in UE's at 5/5. RLE is 4/5 prox to 5/5 distally. Left HF 4/5. mild sensory loss in distal right leg/foot. Cognitivley-linguistically he's at baseline.    Skin: Intact.  Psychiatric: He has a normal mood and affect. His speech is normal and behavior is normal. Judgment and thought content normal. Cognition and memory are normal.    Assessment/Plan:  1. Functional deficits secondary to left AKA.   -just received new prosthesis  -gait training with PT. Use of an appropriately fitting limb is probably his best chance of decreasing his phantom limb pain as well 2. Phantom limb pain---  -continue gabapentin 300mg  tid  -increase percocet to 10/325 to help with current pain and with prosthetic training. #60, 2nd RF for next month 3. Follow up with me in about 2 months. 15 minutes of face to face patient care time were spent during this visit. All questions were encouraged and answered.  4. Gout---allopurinol

## 2015-01-24 NOTE — Progress Notes (Signed)
EPIC Encounter for ICM Monitoring  Patient Name: [name] is a 80 y.o. male Date: 01/24/2015 Primary Care Physican: Laurey Morale, MD Primary Cardiologist: Tamala Julian Electrophysiologist: Allred Dry Weight: Does not weigh - post amputee  Bi-V Pacing 99.9%       In the past month, have you:  1. Gained more than 2 pounds in a day or more than 5 pounds in a week? Unknown  2. Had changes in your medications (with verification of current medications)? no  3. Had more shortness of breath than is usual for you? no  4. Limited your activity because of shortness of breath? no  5. Not been able to sleep because of shortness of breath? no  6. Had increased swelling in your feet or ankles? no  7. Had symptoms of dehydration (dizziness, dry mouth, increased thirst, decreased urine output) no  8. Had changes in sodium restriction? no  9. Been compliant with medication? Yes   ICM trend:   Follow-up plan: ICM clinic phone appointment 02/24/2015.  Optivol thoracic impedance trending along baseline and patient denied any HF symptoms.  He starts physical therapy next week with new prosthesis. He had Melanoma removed from his eyelid in last 2 weeks.  His son and 2 children will be visiting for Thanksgiving.   No changes today.  Copy of note sent to patient's primary care physician, primary cardiologist, and device following physician.  Rosalene Billings, RN, CCM 01/24/2015 10:46 AM

## 2015-01-24 NOTE — Telephone Encounter (Signed)
Spoke with patient.

## 2015-01-31 ENCOUNTER — Encounter: Payer: Self-pay | Admitting: Physical Therapy

## 2015-01-31 ENCOUNTER — Ambulatory Visit: Payer: Commercial Managed Care - HMO | Attending: Registered Nurse | Admitting: Physical Therapy

## 2015-01-31 DIAGNOSIS — Z7409 Other reduced mobility: Secondary | ICD-10-CM | POA: Diagnosis present

## 2015-01-31 DIAGNOSIS — M24652 Ankylosis, left hip: Secondary | ICD-10-CM | POA: Insufficient documentation

## 2015-01-31 DIAGNOSIS — R29818 Other symptoms and signs involving the nervous system: Secondary | ICD-10-CM | POA: Insufficient documentation

## 2015-01-31 DIAGNOSIS — R2689 Other abnormalities of gait and mobility: Secondary | ICD-10-CM

## 2015-01-31 DIAGNOSIS — Z4789 Encounter for other orthopedic aftercare: Secondary | ICD-10-CM

## 2015-01-31 DIAGNOSIS — R531 Weakness: Secondary | ICD-10-CM | POA: Insufficient documentation

## 2015-01-31 DIAGNOSIS — R269 Unspecified abnormalities of gait and mobility: Secondary | ICD-10-CM | POA: Diagnosis not present

## 2015-01-31 DIAGNOSIS — Z5189 Encounter for other specified aftercare: Secondary | ICD-10-CM | POA: Insufficient documentation

## 2015-01-31 DIAGNOSIS — Z89612 Acquired absence of left leg above knee: Secondary | ICD-10-CM | POA: Insufficient documentation

## 2015-02-01 NOTE — Therapy (Signed)
[facility] [address] [place], [place], [address] Phone: [phone]   Fax:  [phone]  Physical Therapy Evaluation  Patient Details  Name: [name] MRN: [id number] Date of Birth: 1934/09/14 Referring Provider: Alger Simons, MD  Encounter Date: 01/31/2015      PT End of Session - 01/31/15 1315    Visit Number 1   Number of Visits 18   Date for PT Re-Evaluation 03/31/15   Authorization Type Medicare G-code & progress report   PT Start Time 1230   PT Stop Time 1315   PT Time Calculation (min) 45 min      Past Medical History  Diagnosis Date  . Gout   . Benign prostatic hypertrophy     takes Proscar daily  . Atrial fibrillation ([place])     takes Warfarin daily  . Chronic systolic dysfunction of left ventricle     a. mixed ischemic and nonischemic CM,  EF 35%. b. s/p AICD implantation.  . ED (erectile dysfunction)   . Arthritis   . Pacemaker     medtronic  . CAD (coronary artery disease)     a. s/p mid LAD stenting with DES 2008 Dr. Daneen Schick  . ICD (implantable cardiac defibrillator) in place     medtronic, Dr. Rayann Heman  . ICD (implantable cardiac defibrillator) in place   . Sleep apnea     hx of "had surgery for"  . Automatic implantable cardioverter-defibrillator in situ   . Depression   . Numbness and tingling     Hx; 79f left foot  . PAD (peripheral artery disease) (HCC)     Severe by PV angiogram 09/2011  . Renal artery stenosis ([place])     s/p stenting 2009  . PAD (peripheral artery disease) (HCC)     s/p multiple LLE bypass grafts; left mid-distal SCA occlusion by 08/2012 duplex  . Hypertension     takes Carvedilol and Losartan daily  . Constipation     takes Miralax daily as needed and Colace daily   . Type II diabetes mellitus (HCC)     takes Novolog 70/30  . CHF (congestive heart failure) (HCC)     takes Lasix daily  . History of MRSA infection   . Insomnia     TAKES  TRAZODONE NIGHTLY    Past Surgical History  Procedure Laterality Date  . Turp vaporization    . Cardiac defibrillator placement  12/26/09    pacemaker combo  . Cervical epidural injection  2013  . Femoral-tibial bypass graft  09/25/2011    Procedure: BYPASS GRAFT FEMORAL-TIBIAL ARTERY;  Surgeon: Mal Misty, MD;  Location: [facility] OR;  Service: Vascular;  Laterality: Left;  Left Femoral - Anterior Tibial Bypass;  saphenous vein graft from left leg  . Intraoperative arteriogram  09/25/2011    Procedure: INTRA OPERATIVE ARTERIOGRAM;  Surgeon: Mal Misty, MD;  Location: [facility];  Service: Vascular;  Laterality: Left;  . Femoral-tibial bypass graft  02/07/2012    Procedure: BYPASS GRAFT FEMORAL-TIBIAL ARTERY;  Surgeon: Rosetta Posner, MD;  Location: [facility];  Service: Vascular;  Laterality: Left;  Thrombectomy Left Femoral - Tibial Bypass Graft  . Embolectomy  02/07/2012    Procedure: EMBOLECTOMY;  Surgeon: Rosetta Posner, MD;  Location: [facility];  Service: Vascular;  Laterality: Left;  . Femoral-tibial bypass graft  04/03/2012    Procedure: BYPASS GRAFT FEMORAL-TIBIAL ARTERY;  Surgeon: Mal Misty, MD;  Location: [facility];  Service:  Vascular;  Laterality: Left;  Redo  . Insert / replace / remove pacemaker  2007  . Coronary angioplasty with stent placement  ~ 2000  . Coronary angioplasty    . Uvulopalatopharyngoplasty (uppp)/tonsillectomy/septoplasty  06/30/2003    Archie Endo 06/30/2003 (07/10/2012)  . Shoulder open rotator cuff repair Right 2001    repair of lacerated right/notes 10/11/1999  (07/10/2012)  . Foot surgery Right 03/20/2001    "have plates and screws in; didn't break it" (07/10/2012)  . Carpal tunnel release Right 2002    Archie Endo 03/20/2001 (07/10/2012)  . Biceps tendon repair Right 2001    Archie Endo 03/20/2001 (07/10/2012)  . Renal artery stent  2009  . Femoral-popliteal bypass graft Left 09/16/2012    Procedure: LEFT FEMORAL-POPLITEAL BYPASS GRAFT WITH GORTEX Propaten Graft 6x80 Thin Wall and Left lower leg  Angiogram;  Surgeon: Mal Misty, MD;  Location: [facility];  Service: Vascular;  Laterality: Left;  . Colonoscopy      Hx; of  . Tonsillectomy    . Adenoidectomy      Hx; of   . Femoral-tibial bypass graft Left 09/30/2012    Procedure: REDO LEFT FEMORAL-ANTERIOR TIBIAL ARTERY BYPASS USING COMPOSITE CEPHALIC AND BASILIC VEIN GRAFT FROM LEFT ARM;  Surgeon: Mal Misty, MD;  Location: [facility];  Service: Vascular;  Laterality: Left;  . I&d extremity Left 10/21/2012    Procedure: EXPLORATION AND DEBRIDEMENT OF LEFT GROIN WOUND;  Surgeon: Mal Misty, MD;  Location: [facility];  Service: Vascular;  Laterality: Left;  . Patch angioplasty Left 10/21/2012    Procedure: PATCH ANGIOPLASTY;  Surgeon: Mal Misty, MD;  Location: [facility];  Service: Vascular;  Laterality: Left;  . Groin debridement Left 11/12/2012    Procedure: CLOSURE INGUINAL WOUND;  Surgeon: Mal Misty, MD;  Location: [facility];  Service: Vascular;  Laterality: Left;  . Embolectomy Left 12/16/2012    Procedure: THROMBECTOMY  LEFT LEG BYPASS;  Surgeon: Elam Dutch, MD;  Location: [facility];  Service: Vascular;  Laterality: Left;  . Leg amputation above knee Left 02/04/2013    DR LAWSON  . Amputation Left 02/04/2013    Procedure: AMPUTATION ABOVE KNEE-LEFT;  Surgeon: Mal Misty, MD;  Location: [facility];  Service: Vascular;  Laterality: Left;  . Removal of graft Left 04/16/2013    Procedure: I & D LEFT AKA WOUND, POSSIBLE REMOVAL OF INFECTED GORTEX GRAFT;  Surgeon: Mal Misty, MD;  Location: [facility];  Service: Vascular;  Laterality: Left;  . Abdominal aortagram N/A 09/11/2011    Procedure: ABDOMINAL Maxcine Ham;  Surgeon: Wellington Hampshire, MD;  Location: [facility] CATH LAB;  Service: Cardiovascular;  Laterality: N/A;  . Lower extremity angiogram Left 12/25/2011    Procedure: LOWER EXTREMITY ANGIOGRAM;  Surgeon: Serafina Mitchell, MD;  Location: [facility] CATH LAB;  Service: Cardiovascular;  Laterality: Left;  lt leg angio co2  . Abdominal angiogram  12/25/2011     Procedure: ABDOMINAL ANGIOGRAM;  Surgeon: Serafina Mitchell, MD;  Location: [facility] CATH LAB;  Service: Cardiovascular;;  . Lower extremity angiogram N/A 07/07/2012    Procedure: LOWER EXTREMITY ANGIOGRAM;  Surgeon: Conrad Success, MD;  Location: [facility] CATH LAB;  Service: Cardiovascular;  Laterality: N/A;  . Lower extremity angiogram N/A 09/15/2012    Procedure: LOWER EXTREMITY ANGIOGRAM;  Surgeon: Serafina Mitchell, MD;  Location: [facility] CATH LAB;  Service: Cardiovascular;  Laterality: N/A;  . Upper extremity angiogram  09/15/2012    Procedure: UPPER EXTREMITY ANGIOGRAM;  Surgeon: Serafina Mitchell,  MD;  Location: [facility] CATH LAB;  Service: Cardiovascular;;  . Biv icd genertaor change out N/A 11/09/2013    Procedure: BIV ICD GENERTAOR CHANGE OUT;  Surgeon: Coralyn Mark, MD;  Location: [facility] CATH LAB;  Service: Cardiovascular;  Laterality: N/A;    There were no vitals filed for this visit.  Visit Diagnosis:  Abnormality of gait  Status post above knee amputation of left lower extremity (HCC)  Balance problems  Impaired functional mobility and activity tolerance  Weakness generalized  Decreased range of motion of hip, left  Encounter for prosthetic gait training      Subjective Assessment - 01/31/15 1232    Subjective This 80yo male underwent a left Transfemoral Amputation on 02/04/2013 due to peripheral vascular disease. He got a C-leg (microprocessor) initially as his potential was thought to be K3 (full community with variable cadence). He was unable to learn how to use C-leg with multiple falls and weight of microprocessor inhibited his activity level. He recieved a new prosthesis on 01/19/2015 with new componentry that he does not know how to use. He presents for PT evaluation for prosthetic training with new prosthesis.    Patient Stated Goals He wants to walk with prosthesis.    Currently in Pain? No/denies            [facility] PT Assessment - 01/31/15 1230    Assessment   Medical Diagnosis Left  Transfemoral Amputation   new prosthesis   Referring Provider Alger Simons, MD   Onset Date/Surgical Date 01/19/15  New prosthesis delivery   Precautions   Precautions Fall   Restrictions   Weight Bearing Restrictions No   Balance Screen   Has the patient fallen in the past 6 months No   Has the patient had a decrease in activity level because of a fear of falling?  Yes   Is the patient reluctant to leave their home because of a fear of falling?  Yes   [place] Private residence   Living Arrangements Alone   Type of Home Other(Comment)  [place] - 2 wheels;Crutches;Cane - single point;Shower seat;Grab bars - tub/shower;Wheelchair - Press photographer   Prior Function   Level of Independence Independent;Independent with household mobility with device;Independent with community mobility with device  prior to amputation no device   Leisure golf   Posture/Postural Control   Posture/Postural Control Postural limitations   Postural Limitations Rounded Shoulders;Forward head;Flexed trunk;Weight shift right   ROM / Strength   AROM / PROM / Strength PROM;Strength   PROM   Left Hip Extension -36  Thomas Hip Extension   Strength   Overall Strength Comments core stability poor, UE WFL   Right/Left Hip Right;Left   Right Hip Flexion 4/5   Right Hip Extension 3/5   Right Hip ABduction 3+/5   Left Hip Flexion 4/5   Left Hip Extension 3-/5   Left Hip ABduction 3-/5   Transfers   Transfers Sit to Stand;Stand to Sit;Stand Pivot Transfers   Sit to Stand 5: Supervision;With upper extremity assist;With armrests;From chair/3-in-1  requires touch of object to stabilze, cues on knee control   Sit to Stand Details (indicate cue type and reason) improper location of prosthetic foot prior to arising to enable to engage prosthetic knee sooner & assist with stabilization   Stand to Sit 5:  Supervision;With upper extremity assist;With armrests;To chair/3-in-1  from [facility]  to position, cues on prosthetic knee control   Stand to Sit Details prosthetic foot in wrong location to unlock to sit down and reaches with improper hand increasing risk of falling with weighting unlocked prosthesis   Stand Pivot Transfers 5: Supervision;With armrests   Stand Pivot Transfer Details (indicate cue type and reason) Without RW with locked prosthesis using UE support on armrests and surface going to with unsafe technique; With RW verbal cues to control prosthesis.    Ambulation/Gait   Ambulation/Gait Yes   Ambulation/Gait Assistance 5: Supervision;3: Mod assist  SBA with RW and modA with SBQC   Ambulation Distance (Feet) 65 Feet  36' with RW, stopped with LE fatigue, 20' with [facility]   Assistive device Prosthesis;Rolling walker;Small based quad cane   Gait Pattern Step-to pattern;Decreased step length - right;Decreased stance time - left;Decreased stride length;Decreased hip/knee flexion - left;Decreased weight shift to left;Left circumduction;Left hip hike;Antalgic;Trunk flexed;Abducted - left   Ambulation Surface Indoor;Level   Gait velocity 0.97 ft/sec with RW   Berg Balance Test   Sit to Stand Needs minimal aid to stand or to stabilize   Standing Unsupported Able to stand 30 seconds unsupported   Sitting with Back Unsupported but Feet Supported on Floor or Stool Able to sit safely and securely 2 minutes   Stand to Sit Controls descent by using hands   Transfers Able to transfer with verbal cueing and /or supervision   Standing Unsupported with Eyes Closed Unable to keep eyes closed 3 seconds but stays steady   Standing Ubsupported with Feet Together Needs help to attain position and unable to hold for 15 seconds   From Standing, Reach Forward with Outstretched Arm Loses balance while trying/requires external support   From Standing Position, Pick up Object from Floor Unable to pick up and needs  supervision   From Standing Position, Turn to Look Behind Over each Shoulder Needs supervision when turning   Turn 360 Degrees Needs assistance while turning   Standing Unsupported, Alternately Place Feet on Step/Stool Needs assistance to keep from falling or unable to try   Standing Unsupported, One Foot in [employer] balance while stepping or standing   Standing on One Leg Unable to try or needs assist to prevent fall   Total Score 15   Timed Up and Go Test   Normal TUG (seconds) 104.86  SBQC with modA         Prosthetics Assessment - 01/31/15 1230    Prosthetics   Prosthetic Care Independent with Prosthetic cleaning;Residual limb care;Ply sock cleaning   Prosthetic Care Dependent with Skin check;Proper wear schedule/adjustment  donning, knee control with new knee including sit to stand   Donning prosthesis  Supervision   Doffing prosthesis  Modified independent (Device/Increase time)   Current prosthetic wear tolerance (days/week)  daily   Current prosthetic wear tolerance (#hours/day)  6-8 hrs of 14-16 hrs out of bed   Current prosthetic weight-bearing tolerance (hours/day)  stands /gait 3-5 minutes c/o right LE fatigue   Residual limb condition  No open areas, no hair growth, shiny skin, darkened color with decreased circulation   K code/activity level with prosthetic use  2                            PT Short Term Goals - 01/31/15 1315    PT SHORT TERM GOAL #1   Title donnes prosthesis with new suspension correctly independently. (Target Date: 03/03/2015)  Time 1   Period Months   Status New   PT SHORT TERM GOAL #2   Title demonstrates understanding of initial HEP  (Target Date: 03/03/2015)   Time 1   Period Months   Status New   PT SHORT TERM GOAL #3   Title wears prosthesis >70% of awake hours >/= 6 days /wk.  (Target Date: 03/03/2015)   Time 1   Period Months   Status New   PT SHORT TERM GOAL #4   Title ambulates 100' with rolling walker &  prosthesis with cues only for deviations.  (Target Date: 03/03/2015)   Time 1   Period Months   Status New   PT SHORT TERM GOAL #5   Title stands 60sec without UE support with supervision.  (Target Date: 03/03/2015)   Time 1   Period Months   Status New   PT SHORT TERM GOAL #6   Title pt ambulates 27' with a cane along counter or table for contact with LUE with supervision.  (Target Date: 03/03/2015)   Time 1   Period Months   Status New   PT SHORT TERM GOAL #7   Title pt negotiates ramps and curb with RW & prosthesis with supervision.  (Target Date: 03/03/2015)   Time 1   Period Months   Status New   PT SHORT TERM GOAL #8   Title pt able to transfer safely from [facility] to chair with armrests or car without RW with supervision.  (Target Date: 03/03/2015)   Time 1   Period Months   Status New           PT Long Term Goals - 01/31/15 1315    PT LONG TERM GOAL #1   Title wears prosthesis >80% of awake hours daily.  (Target Date: 03/31/2015)   Time 2   Period Months   Status New   PT LONG TERM GOAL #2   Title verbalize understanding of ongoing fitness plan.  (Target Date: 03/31/2015)   Time 2   Period Months   Status New   PT LONG TERM GOAL #3   Title ambulates 250' with rolling walker & prosthesis modified independent.  (Target Date: 03/31/2015)   Time 2   Period Months   Status New   PT LONG TERM GOAL #4   Title Berg Balance >/= 20/56  (Target Date: 03/31/2015)   Time 2   Period Months   Status New   PT LONG TERM GOAL #5   Title negotiate ramp, curb & stairs (1 rail) with rolling walker & prosthesis modified independent.  (Target Date: 03/31/2015)   Time 2   Period Months   Status New   PT LONG TERM GOAL #6   Title ambulates 56' with [facility] & prosthesis modified independent.  (Target Date: 03/31/2015)   Time 2   Period Months   Status New               Plan - 01/31/15 1315    Clinical Impression Statement Patient recieved a new prosthesis with different  prosthetic knee unit that functions differently & different suspension system. He needs skilled PT to safely learn to use new components and increase activity level with lower fall risk. His Berg Balance score of 15/56, gait velocity of 0.75ft/sec and TUG of 104.86sec all indicate high fall risk. Patient would benefit from skilled PT to increase his activity level, increase safe prosthesis use and decrease risk of falls.    Pt will benefit from  skilled therapeutic intervention in order to improve on the following deficits Abnormal gait;Decreased activity tolerance;Decreased balance;Decreased endurance;Decreased knowledge of use of DME;Decreased mobility;Decreased safety awareness;Decreased range of motion;Decreased strength;Impaired flexibility;Postural dysfunction;Prosthetic Dependency   Rehab Potential Good   PT Frequency 2x / week   PT Duration Other (comment)  9 weeks (60 days)   PT Treatment/Interventions ADLs/Self Care Home Management;DME Instruction;Gait training;Stair training;Functional mobility training;Therapeutic activities;Therapeutic exercise;Balance training;Neuromuscular re-education;Patient/family education;Prosthetic Training   PT Next Visit Plan review new prosthetic knee function including sit to/from stand, review HEP of mid-line at sink and stretches for hip flexors, hamstring and heelcord.   Consulted and Agree with Plan of Care Patient          G-Codes - 02/18/2015 1315    Functional Assessment Tool Used wears prosthesis daily for 50-60% of awake hours. Requires instruction in donning new suspension system and new knee control.    Functional Limitation Self care   Mobility: Walking and Moving Around Current Status [phone]) At least 40 percent but less than 60 percent impaired, limited or restricted   Mobility: Walking and Moving Around Goal Status [phone]) At least 1 percent but less than 20 percent impaired, limited or restricted       Problem List Patient Active Problem  List   Diagnosis Date Noted  . Phantom limb syndrome with pain ([place]) 08/03/2014  . Diabetic polyneuropathy associated with diabetes mellitus due to underlying condition ([place]) 07/12/2014  . CVA (cerebral infarction) 06/15/2014  . Expressive aphasia 06/15/2014  . CKD (chronic kidney disease) stage 4, GFR 15-29 ml/min (HCC) 06/15/2014  . DM (diabetes mellitus), type 2, uncontrolled, with renal complications ([place]) [id number]  . Unilateral AKA ([place]) 02/09/2013  . S/P ICD (internal cardiac defibrillator) procedure 05/18/2012  . PAD (peripheral artery disease) ([place]) 09/04/2011  . CAD (coronary artery disease)   . Chronic systolic congestive heart failure ([place]) 07/06/2010  . Gout 09/18/2007  . Essential hypertension 01/23/2007  . Atrial fibrillation ([place]) 01/23/2007  . BPH (benign prostatic hyperplasia) 01/23/2007    Jamey Reas PT, DPT 02/01/2015, 11:56 AM  [facility] [address] [place], [place], [address] Phone: [phone]   Fax:  [phone]  Name: [name] MRN: [id number] Date of Birth: [birth year]

## 2015-02-02 ENCOUNTER — Ambulatory Visit: Payer: Commercial Managed Care - HMO | Attending: Registered Nurse | Admitting: Physical Therapy

## 2015-02-02 ENCOUNTER — Encounter: Payer: Self-pay | Admitting: Physical Therapy

## 2015-02-02 DIAGNOSIS — R531 Weakness: Secondary | ICD-10-CM | POA: Diagnosis present

## 2015-02-02 DIAGNOSIS — M24652 Ankylosis, left hip: Secondary | ICD-10-CM | POA: Diagnosis present

## 2015-02-02 DIAGNOSIS — R269 Unspecified abnormalities of gait and mobility: Secondary | ICD-10-CM | POA: Diagnosis not present

## 2015-02-02 DIAGNOSIS — R2689 Other abnormalities of gait and mobility: Secondary | ICD-10-CM

## 2015-02-02 DIAGNOSIS — Z89612 Acquired absence of left leg above knee: Secondary | ICD-10-CM

## 2015-02-02 DIAGNOSIS — Z5189 Encounter for other specified aftercare: Secondary | ICD-10-CM | POA: Diagnosis present

## 2015-02-02 DIAGNOSIS — R29818 Other symptoms and signs involving the nervous system: Secondary | ICD-10-CM | POA: Insufficient documentation

## 2015-02-02 DIAGNOSIS — R6889 Other general symptoms and signs: Secondary | ICD-10-CM | POA: Diagnosis present

## 2015-02-02 DIAGNOSIS — Z7409 Other reduced mobility: Secondary | ICD-10-CM | POA: Diagnosis present

## 2015-02-02 DIAGNOSIS — Z4789 Encounter for other orthopedic aftercare: Secondary | ICD-10-CM

## 2015-02-02 NOTE — Therapy (Signed)
[facility] [address] [place], [place], [address] Phone: [phone]   Fax:  [phone]  Physical Therapy Treatment  Patient Details  Name: [name] MRN: [id number] Date of Birth: 08/13/1934 Referring Provider: Alger Simons, MD  Encounter Date: 02/02/2015      PT End of Session - 02/02/15 1332    Visit Number 2   Number of Visits 18   Date for PT Re-Evaluation 03/31/15   Authorization Type Medicare G-code & progress report   PT Start Time 1315   PT Stop Time 1353   PT Time Calculation (min) 38 min      Past Medical History  Diagnosis Date  . Gout   . Benign prostatic hypertrophy     takes Proscar daily  . Atrial fibrillation ([place])     takes Warfarin daily  . Chronic systolic dysfunction of left ventricle     a. mixed ischemic and nonischemic CM,  EF 35%. b. s/p AICD implantation.  . ED (erectile dysfunction)   . Arthritis   . Pacemaker     medtronic  . CAD (coronary artery disease)     a. s/p mid LAD stenting with DES 2008 Dr. Daneen Schick  . ICD (implantable cardiac defibrillator) in place     medtronic, Dr. Rayann Heman  . ICD (implantable cardiac defibrillator) in place   . Sleep apnea     hx of "had surgery for"  . Automatic implantable cardioverter-defibrillator in situ   . Depression   . Numbness and tingling     Hx; 79f left foot  . PAD (peripheral artery disease) (HCC)     Severe by PV angiogram 09/2011  . Renal artery stenosis ([place])     s/p stenting 2009  . PAD (peripheral artery disease) (HCC)     s/p multiple LLE bypass grafts; left mid-distal SCA occlusion by 08/2012 duplex  . Hypertension     takes Carvedilol and Losartan daily  . Constipation     takes Miralax daily as needed and Colace daily   . Type II diabetes mellitus (HCC)     takes Novolog 70/30  . CHF (congestive heart failure) (HCC)     takes Lasix daily  . History of MRSA infection   . Insomnia     TAKES TRAZODONE  NIGHTLY    Past Surgical History  Procedure Laterality Date  . Turp vaporization    . Cardiac defibrillator placement  12/26/09    pacemaker combo  . Cervical epidural injection  2013  . Femoral-tibial bypass graft  09/25/2011    Procedure: BYPASS GRAFT FEMORAL-TIBIAL ARTERY;  Surgeon: Mal Misty, MD;  Location: [facility];  Service: Vascular;  Laterality: Left;  Left Femoral - Anterior Tibial Bypass;  saphenous vein graft from left leg  . Intraoperative arteriogram  09/25/2011    Procedure: INTRA OPERATIVE ARTERIOGRAM;  Surgeon: Mal Misty, MD;  Location: [facility];  Service: Vascular;  Laterality: Left;  . Femoral-tibial bypass graft  02/07/2012    Procedure: BYPASS GRAFT FEMORAL-TIBIAL ARTERY;  Surgeon: Rosetta Posner, MD;  Location: [facility];  Service: Vascular;  Laterality: Left;  Thrombectomy Left Femoral - Tibial Bypass Graft  . Embolectomy  02/07/2012    Procedure: EMBOLECTOMY;  Surgeon: Rosetta Posner, MD;  Location: [facility];  Service: Vascular;  Laterality: Left;  . Femoral-tibial bypass graft  04/03/2012    Procedure: BYPASS GRAFT FEMORAL-TIBIAL ARTERY;  Surgeon: Mal Misty, MD;  Location: [facility];  Service:  Vascular;  Laterality: Left;  Redo  . Insert / replace / remove pacemaker  2007  . Coronary angioplasty with stent placement  ~ 2000  . Coronary angioplasty    . Uvulopalatopharyngoplasty (uppp)/tonsillectomy/septoplasty  06/30/2003    Archie Endo 06/30/2003 (07/10/2012)  . Shoulder open rotator cuff repair Right 2001    repair of lacerated right/notes 10/11/1999  (07/10/2012)  . Foot surgery Right 03/20/2001    "have plates and screws in; didn't break it" (07/10/2012)  . Carpal tunnel release Right 2002    Archie Endo 03/20/2001 (07/10/2012)  . Biceps tendon repair Right 2001    Archie Endo 03/20/2001 (07/10/2012)  . Renal artery stent  2009  . Femoral-popliteal bypass graft Left 09/16/2012    Procedure: LEFT FEMORAL-POPLITEAL BYPASS GRAFT WITH GORTEX Propaten Graft 6x80 Thin Wall and Left lower leg Angiogram;   Surgeon: Mal Misty, MD;  Location: [facility];  Service: Vascular;  Laterality: Left;  . Colonoscopy      Hx; of  . Tonsillectomy    . Adenoidectomy      Hx; of   . Femoral-tibial bypass graft Left 09/30/2012    Procedure: REDO LEFT FEMORAL-ANTERIOR TIBIAL ARTERY BYPASS USING COMPOSITE CEPHALIC AND BASILIC VEIN GRAFT FROM LEFT ARM;  Surgeon: Mal Misty, MD;  Location: [facility];  Service: Vascular;  Laterality: Left;  . I&d extremity Left 10/21/2012    Procedure: EXPLORATION AND DEBRIDEMENT OF LEFT GROIN WOUND;  Surgeon: Mal Misty, MD;  Location: [facility];  Service: Vascular;  Laterality: Left;  . Patch angioplasty Left 10/21/2012    Procedure: PATCH ANGIOPLASTY;  Surgeon: Mal Misty, MD;  Location: [facility];  Service: Vascular;  Laterality: Left;  . Groin debridement Left 11/12/2012    Procedure: CLOSURE INGUINAL WOUND;  Surgeon: Mal Misty, MD;  Location: [facility];  Service: Vascular;  Laterality: Left;  . Embolectomy Left 12/16/2012    Procedure: THROMBECTOMY  LEFT LEG BYPASS;  Surgeon: Elam Dutch, MD;  Location: [facility];  Service: Vascular;  Laterality: Left;  . Leg amputation above knee Left 02/04/2013    DR LAWSON  . Amputation Left 02/04/2013    Procedure: AMPUTATION ABOVE KNEE-LEFT;  Surgeon: Mal Misty, MD;  Location: [facility];  Service: Vascular;  Laterality: Left;  . Removal of graft Left 04/16/2013    Procedure: I & D LEFT AKA WOUND, POSSIBLE REMOVAL OF INFECTED GORTEX GRAFT;  Surgeon: Mal Misty, MD;  Location: [facility];  Service: Vascular;  Laterality: Left;  . Abdominal aortagram N/A 09/11/2011    Procedure: ABDOMINAL Maxcine Ham;  Surgeon: Wellington Hampshire, MD;  Location[facility] C[facility] LAB;  Service: Cardiovascular;  Laterality: N/A;  . Lower extremity angiogram Left 12/25/2011    Procedure: LOWER EXTREMITY ANGIOGRAM;  Surgeon: Serafina Mitchell, MD;  Location: [facility] CATH LAB;  Service: Cardiovascular;  Laterality: Left;  lt leg angio co2  . Abdominal angiogram  12/25/2011     Procedure: ABDOMINAL ANGIOGRAM;  Surgeon: Serafina Mitchell, MD;  Location: [facility] CATH LAB;  Service: Cardiovascular;;  . Lower extremity angiogram N/A 07/07/2012    Procedure: LOWER EXTREMITY ANGIOGRAM;  Surgeon: Conrad Bells, MD;  Location: [facility] CATH LAB;  Service: Cardiovascular;  Laterality: N/A;  . Lower extremity angiogram N/A 09/15/2012    Procedure: LOWER EXTREMITY ANGIOGRAM;  Surgeon: Serafina Mitchell, MD;  Location: [facility] CATH LAB;  Service: Cardiovascular;  Laterality: N/A;  . Upper extremity angiogram  09/15/2012    Procedure: UPPER EXTREMITY ANGIOGRAM;  Surgeon: Serafina Mitchell,  MD;  Location: [facility] CATH LAB;  Service: Cardiovascular;;  . Biv icd genertaor change out N/A 11/09/2013    Procedure: BIV ICD GENERTAOR CHANGE OUT;  Surgeon: Coralyn Mark, MD;  Location: [facility] CATH LAB;  Service: Cardiovascular;  Laterality: N/A;    There were no vitals filed for this visit.  Visit Diagnosis:  Abnormality of gait  Status post above knee amputation of left lower extremity (HCC)  Balance problems  Impaired functional mobility and activity tolerance  Weakness generalized  Decreased range of motion of hip, left  Encounter for prosthetic gait training  Activity intolerance      Subjective Assessment - 02/02/15 1432    Subjective Pt reports that he has been walking in home with cane. Pt advised not to use cane at all due to learning how to use new prosthesis. Pt with increased fall risk with cane use. Pt reports that skin is WNL and not red like last session. Pt does report that residual limb feels tender, but no pain today.  This appears to be due to new pressure on tissue from new L prosthesis.  Pt reports attending silver sneakers classes.   Limitations Walking   Patient Stated Goals He wants to walk with prosthesis.    Currently in Pain? No/denies   Multiple Pain Sites No     Prosthetic Training: To maximize pt's functional independence with new L Transfemoral Amputation prosthesis. Treatment  focused on practice of sit to stand and stand/pivot transfers, along with hip flexor and trunk stretches.   1. Sit to stand from WC<>RW Retail banker) with supervision / cues on proper technique to lock knee with ascending then weighting prosthesis progressing to stabilizing without touching RW. He requires use of UE control standing & sitting. Stand to sit: cues on proper foot position to unlock prosthesis and close enough to sit onto chair, reaching with right UE initially for safety.   2. Stand pivot transfer (blocked practice) from [facility] to chair with and with out arm rests with RW use. Cues on knee control sit to/from stand as noted above. He required cues to incorporate proper technique into functional activity of transferring.    Therapeutic Exercise: To promote proper ROM in B LE's and to strengthen abdominals.  1. Hip flexor Half kneeling stretch on L with use of WC for UE support on R and extra chair for UE support on L. Pt completed 1 rep 90 sec hold. Yoga block used under L knee for contact with ground to maximize effects of stretch. Verbal cues for gluteal contraction, and advancing pelvis forward. Pt also required supervision assist and verbal and tactile cues with descent from seated position in WC to half kneeling on L Knee. Cues for hand and foot placement and proper sequencing.  2. Seated trunk rotation: 1 rep each side with 30 sec hold. Therapist assessing independence for self management. Pt supervision with verbal cues to look behind him to maximize stretch.  3. Seated heel cord stretch with non-elastic strap to promote proper body mechanics of R LE. 2 reps, 30 sec holds.  4. Seated hamstring stretch with verbal cues for proper trunk posture to minimize trunk & maximize hamstring stretch. Right LE 2 reps 30 sec hold.   5. Supine hip flexor stretch with prosthesis off edge of mat in closed chain position for gluteal set for optimal stretch. Verbal, tactile, and visual cues with  use of mirror for sequencing, body placement on mat and proper form.  6. Supine abominal  crunch with head raise: 1 set X 10 reps with supervision assist with verbal and visual cues for breathing upon abdominal contraction.        PT Education - 02/02/15 1325    Education provided Yes   Education Details Pt educated on use of new L above knee prosthesis with sit to stands and stand pivot transfers to chair with and without armrests with a focus on proper foot placement and weight shift to heel for locking knee and weight shift to toe for unlocking knee.  Pt also reeducated on HEP stretches to improve function with prosthesis.(see therapeutic exercise).    Person(s) Educated Patient   Methods Explanation;Demonstration   Comprehension Verbalized understanding;Returned demonstration          PT Short Term Goals - 02/02/15 1333    PT SHORT TERM GOAL #1   Title donnes prosthesis with new suspension correctly independently. (Target Date: 03/03/2015)   Time 1   Period Months   Status New   PT SHORT TERM GOAL #2   Title demonstrates understanding of initial HEP  (Target Date: 03/03/2015)   Time 1   Period Months   Status New   PT SHORT TERM GOAL #3   Title wears prosthesis >70% of awake hours >/= 6 days /wk.  (Target Date: 03/03/2015)   Time 1   Period Months   Status New   PT SHORT TERM GOAL #4   Title ambulates 100' with rolling walker & prosthesis with cues only for deviations.  (Target Date: 03/03/2015)   Time 1   Period Months   Status New   PT SHORT TERM GOAL #5   Title stands 60sec without UE support with supervision.  (Target Date: 03/03/2015)   Time 1   Period Months   Status New   PT SHORT TERM GOAL #6   Title pt ambulates 52' with a cane along counter or table for contact with LUE with supervision.  (Target Date: 03/03/2015)   Time 1   Period Months   Status New   PT SHORT TERM GOAL #7   Title pt negotiates ramps and curb with RW & prosthesis with supervision.   (Target Date: 03/03/2015)   Time 1   Period Months   Status New   PT SHORT TERM GOAL #8   Title pt able to transfer safely from [facility] to chair with armrests or car without RW with supervision.  (Target Date: 03/03/2015)   Time 1   Period Months   Status New           PT Long Term Goals - 02/02/15 1333    PT LONG TERM GOAL #1   Title wears prosthesis >80% of awake hours daily.  (Target Date: 03/31/2015)   Time 2   Period Months   Status New   PT LONG TERM GOAL #2   Title verbalize understanding of ongoing fitness plan.  (Target Date: 03/31/2015)   Time 2   Period Months   Status New   PT LONG TERM GOAL #3   Title ambulates 250' with rolling walker & prosthesis modified independent.  (Target Date: 03/31/2015)   Time 2   Period Months   Status New   PT LONG TERM GOAL #4   Title Berg Balance >/= 20/56  (Target Date: 03/31/2015)   Time 2   Period Months   Status New   PT LONG TERM GOAL #5   Title negotiate ramp, curb & stairs (1 rail) with rolling walker &  prosthesis modified independent.  (Target Date: 03/31/2015)   Time 2   Period Months   Status New   PT LONG TERM GOAL #6   Title ambulates 83' with [facility] & prosthesis modified independent.  (Target Date: 03/31/2015)   Time 2   Period Months   Status New               Plan - 02/02/15 1439    Clinical Impression Statement Skilled treatment session focused on competency of sit to stand and stand pivot transfers as well a stretching to promote normal ROM of L hip flexors and Rt hamstrings/heelcord. Pt is supervision and requires cues for sequencing and proper mechanics with improved control by end of session. Pt verbalizes understanding of stretches of LE's and the necessity for them(see therapeutic exercise).  Pt progressing towards set goals.    Pt will benefit from skilled therapeutic intervention in order to improve on the following deficits Abnormal gait;Decreased activity tolerance;Decreased balance;Decreased  endurance;Decreased knowledge of use of DME;Decreased mobility;Decreased safety awareness;Decreased range of motion;Decreased strength;Impaired flexibility;Postural dysfunction;Prosthetic Dependency   Rehab Potential Good   PT Frequency 2x / week   PT Duration Other (comment)  9 weeks (60 days)   PT Treatment/Interventions ADLs/Self Care Home Management;DME Instruction;Gait training;Stair training;Functional mobility training;Therapeutic activities;Therapeutic exercise;Balance training;Neuromuscular re-education;Patient/family education;Prosthetic Training   PT Next Visit Plan review new prosthetic knee function including sit to/from stand, review HEP of mid-line at sink and stretches for hip flexors, hamstring and heelcord. Prosthetic gait training with RW focusing on knee control & wt shift over prosthesis in stance.    Consulted and Agree with Plan of Care Patient        Problem List Patient Active Problem List   Diagnosis Date Noted  . Phantom limb syndrome with pain ([place]) 08/03/2014  . Diabetic polyneuropathy associated with diabetes mellitus due to underlying condition ([place]) 07/12/2014  . CVA (cerebral infarction) 06/15/2014  . Expressive aphasia 06/15/2014  . CKD (chronic kidney disease) stage 4, GFR 15-29 ml/min (HCC) 06/15/2014  . DM (diabetes mellitus), type 2, uncontrolled, with renal complications ([place]) [id number]  . Unilateral AKA ([place]) 02/09/2013  . S/P ICD (internal cardiac defibrillator) procedure 05/18/2012  . PAD (peripheral artery disease) ([place]) 09/04/2011  . CAD (coronary artery disease)   . Chronic systolic congestive heart failure ([place]) 07/06/2010  . Gout 09/18/2007  . Essential hypertension 01/23/2007  . Atrial fibrillation ([place]) 01/23/2007  . BPH (benign prostatic hyperplasia) 01/23/2007    Laney Potash 02/02/2015, 3:54 PM  Laney Potash, [place]  Name: [name] MRN: [id number] Date of Birth: [birth year]  Jamey Reas, PT, DPT PT Specializing  in [place] 02/02/2015 9:39 PM Phone:  [phone]  Fax:  [phone] [facility] [address] [place], [place] [address]

## 2015-02-08 ENCOUNTER — Encounter: Payer: Self-pay | Admitting: Physical Therapy

## 2015-02-08 ENCOUNTER — Ambulatory Visit: Payer: Commercial Managed Care - HMO | Admitting: Physical Therapy

## 2015-02-08 DIAGNOSIS — R269 Unspecified abnormalities of gait and mobility: Secondary | ICD-10-CM | POA: Diagnosis not present

## 2015-02-08 DIAGNOSIS — Z4789 Encounter for other orthopedic aftercare: Secondary | ICD-10-CM

## 2015-02-08 DIAGNOSIS — R2689 Other abnormalities of gait and mobility: Secondary | ICD-10-CM

## 2015-02-08 DIAGNOSIS — Z89612 Acquired absence of left leg above knee: Secondary | ICD-10-CM

## 2015-02-08 DIAGNOSIS — Z7409 Other reduced mobility: Secondary | ICD-10-CM

## 2015-02-08 DIAGNOSIS — R531 Weakness: Secondary | ICD-10-CM

## 2015-02-08 NOTE — Therapy (Signed)
[facility] [address] [place], [place], [address] Phone: [phone]   Fax:  [phone]  Physical Therapy Treatment  Patient Details  Name: [name] MRN: [id number] Date of Birth: [birth year] Referring Provider: Alger Simons, MD  Encounter Date: 02/08/2015      PT End of Session - 02/08/15 1145    Visit Number 3   Number of Visits 18   Date for PT Re-Evaluation 03/31/15   Authorization Type Medicare G-code & progress report   PT Start Time 1100   PT Stop Time 1145   PT Time Calculation (min) 45 min   Equipment Utilized During Treatment Gait belt   Activity Tolerance Patient tolerated treatment well   Behavior During Therapy [facility] for tasks assessed/performed      Past Medical History  Diagnosis Date  . Gout   . Benign prostatic hypertrophy     takes Proscar daily  . Atrial fibrillation ([place])     takes Warfarin daily  . Chronic systolic dysfunction of left ventricle     a. mixed ischemic and nonischemic CM,  EF 35%. b. s/p AICD implantation.  . ED (erectile dysfunction)   . Arthritis   . Pacemaker     medtronic  . CAD (coronary artery disease)     a. s/p mid LAD stenting with DES 2008 Dr. Daneen Schick  . ICD (implantable cardiac defibrillator) in place     medtronic, Dr. Rayann Heman  . ICD (implantable cardiac defibrillator) in place   . Sleep apnea     hx of "had surgery for"  . Automatic implantable cardioverter-defibrillator in situ   . Depression   . Numbness and tingling     Hx; 40f left foot  . PAD (peripheral artery disease) (HCC)     Severe by PV angiogram 09/2011  . Renal artery stenosis ([place])     s/p stenting 2009  . PAD (peripheral artery disease) (HCC)     s/p multiple LLE bypass grafts; left mid-distal SCA occlusion by 08/2012 duplex  . Hypertension     takes Carvedilol and Losartan daily  . Constipation     takes Miralax daily as needed and Colace daily   . Type II diabetes  mellitus (HCC)     takes Novolog 70/30  . CHF (congestive heart failure) (HCC)     takes Lasix daily  . History of MRSA infection   . Insomnia     TAKES TRAZODONE NIGHTLY    Past Surgical History  Procedure Laterality Date  . Turp vaporization    . Cardiac defibrillator placement  12/26/09    pacemaker combo  . Cervical epidural injection  2013  . Femoral-tibial bypass graft  09/25/2011    Procedure: BYPASS GRAFT FEMORAL-TIBIAL ARTERY;  Surgeon: Mal Misty, MD;  Location: [facility] OR;  Service: Vascular;  Laterality: Left;  Left Femoral - Anterior Tibial Bypass;  saphenous vein graft from left leg  . Intraoperative arteriogram  09/25/2011    Procedure: INTRA OPERATIVE ARTERIOGRAM;  Surgeon: Mal Misty, MD;  Location: [place];  Service: Vascular;  Laterality: Left;  . Femoral-tibial bypass graft  02/07/2012    Procedure: BYPASS GRAFT FEMORAL-TIBIAL ARTERY;  Surgeon: Rosetta Posner, MD;  Location: [place];  Service: Vascular;  Laterality: Left;  Thrombectomy Left Femoral - Tibial Bypass Graft  . Embolectomy  02/07/2012    Procedure: EMBOLECTOMY;  Surgeon: Rosetta Posner, MD;  Location: [place];  Service: Vascular;  Laterality: Left;  .  Femoral-tibial bypass graft  04/03/2012    Procedure: BYPASS GRAFT FEMORAL-TIBIAL ARTERY;  Surgeon: Mal Misty, MD;  Location: [place];  Service: Vascular;  Laterality: Left;  Redo  . Insert / replace / remove pacemaker  2007  . Coronary angioplasty with stent placement  ~ 2000  . Coronary angioplasty    . Uvulopalatopharyngoplasty (uppp)/tonsillectomy/septoplasty  06/30/2003    Archie Endo 06/30/2003 (07/10/2012)  . Shoulder open rotator cuff repair Right 2001    repair of lacerated right/notes 10/11/1999  (07/10/2012)  . Foot surgery Right 03/20/2001    "have plates and screws in; didn't break it" (07/10/2012)  . Carpal tunnel release Right 2002    Archie Endo 03/20/2001 (07/10/2012)  . Biceps tendon repair Right 2001    Archie Endo 03/20/2001 (07/10/2012)  . Renal artery stent  2009  .  Femoral-popliteal bypass graft Left 09/16/2012    Procedure: LEFT FEMORAL-POPLITEAL BYPASS GRAFT WITH GORTEX Propaten Graft 6x80 Thin Wall and Left lower leg Angiogram;  Surgeon: Mal Misty, MD;  Location: [facility];  Service: Vascular;  Laterality: Left;  . Colonoscopy      Hx; of  . Tonsillectomy    . Adenoidectomy      Hx; of   . Femoral-tibial bypass graft Left 09/30/2012    Procedure: REDO LEFT FEMORAL-ANTERIOR TIBIAL ARTERY BYPASS USING COMPOSITE CEPHALIC AND BASILIC VEIN GRAFT FROM LEFT ARM;  Surgeon: Mal Misty, MD;  Location: [place];  Service: Vascular;  Laterality: Left;  . I&d extremity Left 10/21/2012    Procedure: EXPLORATION AND DEBRIDEMENT OF LEFT GROIN WOUND;  Surgeon: Mal Misty, MD;  Location: [place];  Service: Vascular;  Laterality: Left;  . Patch angioplasty Left 10/21/2012    Procedure: PATCH ANGIOPLASTY;  Surgeon: Mal Misty, MD;  Location: [place];  Service: Vascular;  Laterality: Left;  . Groin debridement Left 11/12/2012    Procedure: CLOSURE INGUINAL WOUND;  Surgeon: Mal Misty, MD;  Location: [place];  Service: Vascular;  Laterality: Left;  . Embolectomy Left 12/16/2012    Procedure: THROMBECTOMY  LEFT LEG BYPASS;  Surgeon: Elam Dutch, MD;  Location: [place];  Service: Vascular;  Laterality: Left;  . Leg amputation above knee Left 02/04/2013    DR LAWSON  . Amputation Left 02/04/2013    Procedure: AMPUTATION ABOVE KNEE-LEFT;  Surgeon: Mal Misty, MD;  Location: [facility];  Service: Vascular;  Laterality: Left;  . Removal of graft Left 04/16/2013    Procedure: I & D LEFT AKA WOUND, POSSIBLE REMOVAL OF INFECTED GORTEX GRAFT;  Surgeon: Mal Misty, MD;  Location: [place];  Service: Vascular;  Laterality: Left;  . Abdominal aortagram N/A 09/11/2011    Procedure: ABDOMINAL Maxcine Ham;  Surgeon: [place] Hampshire, MD;  Location: [facility] CATH LAB;  Service: Cardiovascular;  Laterality: N/A;  . Lower extremity angiogram Left 12/25/2011    Procedure: LOWER EXTREMITY  ANGIOGRAM;  Surgeon: Serafina Mitchell, MD;  Location: [facility] CATH LAB;  Service: Cardiovascular;  Laterality: Left;  lt leg angio co2  . Abdominal angiogram  12/25/2011    Procedure: ABDOMINAL ANGIOGRAM;  Surgeon: Serafina Mitchell, MD;  Location: [facility] CATH LAB;  Service: Cardiovascular;;  . Lower extremity angiogram N/A 07/07/2012    Procedure: LOWER EXTREMITY ANGIOGRAM;  Surgeon: [place] Montmorenci, MD;  Location: [facility] CATH LAB;  Service: Cardiovascular;  Laterality: N/A;  . Lower extremity angiogram N/A 09/15/2012    Procedure: LOWER EXTREMITY ANGIOGRAM;  Surgeon: Serafina Mitchell, MD;  Location: [facility] CATH LAB;  Service: Cardiovascular;  Laterality: N/A;  . Upper extremity angiogram  09/15/2012    Procedure: UPPER EXTREMITY ANGIOGRAM;  Surgeon: Serafina Mitchell, MD;  Location: [facility] CATH LAB;  Service: Cardiovascular;;  . Biv icd genertaor change out N/A 11/09/2013    Procedure: BIV ICD GENERTAOR CHANGE OUT;  Surgeon: Coralyn Mark, MD;  Location: [facility] CATH LAB;  Service: Cardiovascular;  Laterality: N/A;    There were no vitals filed for this visit.  Visit Diagnosis:  Abnormality of gait  Balance problems  Status post above knee amputation of left lower extremity (HCC)  Impaired functional mobility and activity tolerance  Weakness generalized  Encounter for prosthetic gait training      Subjective Assessment - 02/08/15 1106    Subjective He has been practicing sit to /from stand as PT advised.    Currently in Pain? No/denies                         [facility] Adult PT Treatment/Exercise - 02/08/15 1100    Transfers   Transfers Sit to Stand;Stand to [employer]   Sit to Stand 5: Supervision;With upper extremity assist;With armrests;From chair/3-in-1  requires touch of object to stabilze, cues on knee control   Sit to Stand Details (indicate cue type and reason) PT instructed in heel contact with floor to extend prosthetic knee   Stand to Sit 5: Supervision;With upper extremity  assist;With armrests;To chair/3-in-1  from RW to position, cues on prosthetic knee control   Stand to Sit Details Cues on foot position to unlock prosthesis before sitting.    Stand Pivot Transfers 5: Supervision;With armrests  RW & prosthesis   Ambulation/Gait   Ambulation/Gait Yes   Ambulation/Gait Assistance 5: Supervision   Ambulation/Gait Assistance Details demo & verbal cues on step length & increase stance time on prosthesis   Ambulation Distance (Feet) 140 Feet  140', 75' & 60'   Assistive device Prosthesis;Rolling walker   Gait Pattern Step-to pattern;Decreased step length - right;Decreased stance time - left;Decreased stride length;Decreased hip/knee flexion - left;Decreased weight shift to left;Left circumduction;Left hip hike;Antalgic;Trunk flexed;Abducted - left   Ambulation Surface Indoor;Level   Gait velocity --   Stairs Yes   Stairs Assistance 5: Supervision   Stairs Assistance Details (indicate cue type and reason) demo & verbal cues on technique if only one rail available; keeping prosthesis locked for safety   Stair Management Technique Two rails;One rail Left;One rail Right  prosthesis locked   Number of Stairs 4  3 reps   Ramp 5: Supervision  RW & prosthesis   Ramp Details (indicate cue type and reason) cues on technique with new prosthesis: unlock prosthesis to ascend, keep locked to descend; verbal cues on wt shift   Curb 5: Supervision  RW & prosthesis   Curb Details (indicate cue type and reason) technique including keeping prosthesis locked, step length & sequance   Posture/Postural Control   Posture/Postural Control --   Postural Limitations --   Neuro Re-ed    Neuro Re-ed Details  standing with prosthesis locked, wt shift left onto prosthesis to rest right hip in standing. pt verbalized understanding.    Prosthetics   Current prosthetic wear tolerance (days/week)  daily   Current prosthetic wear tolerance (#hours/day)  6-8 hrs of 14-16 hrs out of bed    Current prosthetic weight-bearing tolerance (hours/day)  stands /gait 3-5 minutes c/o right LE fatigue   Residual limb condition  No open areas, no  hair growth, shiny skin, darkened color with decreased circulation                  PT Short Term Goals - 02/02/15 1333    PT SHORT TERM GOAL #1   Title donnes prosthesis with new suspension correctly independently. (Target Date: 03/03/2015)   Time 1   Period Months   Status New   PT SHORT TERM GOAL #2   Title demonstrates understanding of initial HEP  (Target Date: 03/03/2015)   Time 1   Period Months   Status New   PT SHORT TERM GOAL #3   Title wears prosthesis >70% of awake hours >/= 6 days /wk.  (Target Date: 03/03/2015)   Time 1   Period Months   Status New   PT SHORT TERM GOAL #4   Title ambulates 100' with rolling walker & prosthesis with cues only for deviations.  (Target Date: 03/03/2015)   Time 1   Period Months   Status New   PT SHORT TERM GOAL #5   Title stands 60sec without UE support with supervision.  (Target Date: 03/03/2015)   Time 1   Period Months   Status New   PT SHORT TERM GOAL #6   Title pt ambulates 17' with a cane along counter or table for contact with LUE with supervision.  (Target Date: 03/03/2015)   Time 1   Period Months   Status New   PT SHORT TERM GOAL #7   Title pt negotiates ramps and curb with RW & prosthesis with supervision.  (Target Date: 03/03/2015)   Time 1   Period Months   Status New   PT SHORT TERM GOAL #8   Title pt able to transfer safely from [facility] to chair with armrests or car without RW with supervision.  (Target Date: 03/03/2015)   Time 1   Period Months   Status New           PT Long Term Goals - 02/02/15 1333    PT LONG TERM GOAL #1   Title wears prosthesis >80% of awake hours daily.  (Target Date: 03/31/2015)   Time 2   Period Months   Status New   PT LONG TERM GOAL #2   Title verbalize understanding of ongoing fitness plan.  (Target Date: 03/31/2015)    Time 2   Period Months   Status New   PT LONG TERM GOAL #3   Title ambulates 250' with rolling walker & prosthesis modified independent.  (Target Date: 03/31/2015)   Time 2   Period Months   Status New   PT LONG TERM GOAL #4   Title Berg Balance >/= 20/56  (Target Date: 03/31/2015)   Time 2   Period Months   Status New   PT LONG TERM GOAL #5   Title negotiate ramp, curb & stairs (1 rail) with rolling walker & prosthesis modified independent.  (Target Date: 03/31/2015)   Time 2   Period Months   Status New   PT LONG TERM GOAL #6   Title ambulates 42' with [facility] & prosthesis modified independent.  (Target Date: 03/31/2015)   Time 2   Period Months   Status New               Plan - 02/08/15 1145    Clinical Impression Statement Patient appears to have a general understanding how negoitate barriers with new knee bur will need further review. Patient improved distance of gait with instruction in proper  step length and increase stance duration.    Pt will benefit from skilled therapeutic intervention in order to improve on the following deficits Abnormal gait;Decreased activity tolerance;Decreased balance;Decreased endurance;Decreased knowledge of use of DME;Decreased mobility;Decreased safety awareness;Decreased range of motion;Decreased strength;Impaired flexibility;Postural dysfunction;Prosthetic Dependency   Rehab Potential Good   PT Frequency 2x / week   PT Duration Other (comment)  9 weeks (60 days)   PT Treatment/Interventions ADLs/Self Care Home Management;DME Instruction;Gait training;Stair training;Functional mobility training;Therapeutic activities;Therapeutic exercise;Balance training;Neuromuscular re-education;Patient/family education;Prosthetic Training   PT Next Visit Plan prosthetic gait including barriers and balance activities   Consulted and Agree with Plan of Care Patient        Problem List Patient Active Problem List   Diagnosis Date Noted  . Phantom limb  syndrome with pain ([place]) 08/03/2014  . Diabetic polyneuropathy associated with diabetes mellitus due to underlying condition ([place]) 07/12/2014  . CVA (cerebral infarction) 06/15/2014  . Expressive aphasia 06/15/2014  . CKD (chronic kidney disease) stage 4, GFR 15-29 ml/min (HCC) 06/15/2014  . DM (diabetes mellitus), type 2, uncontrolled, with renal complications ([place]) [id number]  . Unilateral AKA ([place]) 02/09/2013  . S/P ICD (internal cardiac defibrillator) procedure 05/18/2012  . PAD (peripheral artery disease) ([place]) 09/04/2011  . CAD (coronary artery disease)   . Chronic systolic congestive heart failure ([place]) 07/06/2010  . Gout 09/18/2007  . Essential hypertension 01/23/2007  . Atrial fibrillation ([place]) 01/23/2007  . BPH (benign prostatic hyperplasia) 01/23/2007    Jamey Reas PT, DPT 02/08/2015, 7:34 PM  [facility] [address] [place], [place], [address] Phone: [phone]   Fax:  [phone]  Name: [name] MRN: [id number] Date of Birth: 04-16-1934

## 2015-02-10 ENCOUNTER — Ambulatory Visit: Payer: Commercial Managed Care - HMO | Admitting: Physical Therapy

## 2015-02-10 ENCOUNTER — Encounter: Payer: Self-pay | Admitting: Physical Therapy

## 2015-02-10 DIAGNOSIS — R531 Weakness: Secondary | ICD-10-CM

## 2015-02-10 DIAGNOSIS — R2689 Other abnormalities of gait and mobility: Secondary | ICD-10-CM

## 2015-02-10 DIAGNOSIS — R269 Unspecified abnormalities of gait and mobility: Secondary | ICD-10-CM

## 2015-02-10 DIAGNOSIS — Z89612 Acquired absence of left leg above knee: Secondary | ICD-10-CM

## 2015-02-10 DIAGNOSIS — R6889 Other general symptoms and signs: Secondary | ICD-10-CM

## 2015-02-10 DIAGNOSIS — Z7409 Other reduced mobility: Secondary | ICD-10-CM

## 2015-02-12 NOTE — Therapy (Signed)
[facility] [address] [place], [place], [address] Phone: [phone]   Fax:  [phone]  Physical Therapy Treatment  Patient Details  Name: [name] MRN: [id number] Date of Birth: 05/20/1934 Referring Provider: Alger Simons, MD  Encounter Date: 02/10/2015   02/10/15 0937  PT Visits / Re-Eval  Visit Number 4  Number of Visits 18  Date for PT Re-Evaluation 03/31/15  Authorization  Authorization Type Medicare G-code & progress report  PT Time Calculation  PT Start Time 0932  PT Stop Time 1012  PT Time Calculation (min) 40 min  PT - End of Session  Equipment Utilized During Treatment Gait belt  Activity Tolerance Patient tolerated treatment well  Behavior During Therapy [facility] for tasks assessed/performed      Past Medical History  Diagnosis Date  . Gout   . Benign prostatic hypertrophy     takes Proscar daily  . Atrial fibrillation ([place])     takes Warfarin daily  . Chronic systolic dysfunction of left ventricle     a. mixed ischemic and nonischemic CM,  EF 35%. b. s/p AICD implantation.  . ED (erectile dysfunction)   . Arthritis   . Pacemaker     medtronic  . CAD (coronary artery disease)     a. s/p mid LAD stenting with DES 2008 Dr. Daneen Schick  . ICD (implantable cardiac defibrillator) in place     medtronic, Dr. Rayann Heman  . ICD (implantable cardiac defibrillator) in place   . Sleep apnea     hx of "had surgery for"  . Automatic implantable cardioverter-defibrillator in situ   . Depression   . Numbness and tingling     Hx; 38f left foot  . PAD (peripheral artery disease) (HCC)     Severe by PV angiogram 09/2011  . Renal artery stenosis ([place])     s/p stenting 2009  . PAD (peripheral artery disease) (HCC)     s/p multiple LLE bypass grafts; left mid-distal SCA occlusion by 08/2012 duplex  . Hypertension     takes Carvedilol and Losartan daily  . Constipation     takes Miralax daily as  needed and Colace daily   . Type II diabetes mellitus (HCC)     takes Novolog 70/30  . CHF (congestive heart failure) (HCC)     takes Lasix daily  . History of MRSA infection   . Insomnia     TAKES TRAZODONE NIGHTLY    Past Surgical History  Procedure Laterality Date  . Turp vaporization    . Cardiac defibrillator placement  12/26/09    pacemaker combo  . Cervical epidural injection  2013  . Femoral-tibial bypass graft  09/25/2011    Procedure: BYPASS GRAFT FEMORAL-TIBIAL ARTERY;  Surgeon: Mal Misty, MD;  Location: [facility] OR;  Service: Vascular;  Laterality: Left;  Left Femoral - Anterior Tibial Bypass;  saphenous vein graft from left leg  . Intraoperative arteriogram  09/25/2011    Procedure: INTRA OPERATIVE ARTERIOGRAM;  Surgeon: Mal Misty, MD;  Location: [place];  Service: Vascular;  Laterality: Left;  . Femoral-tibial bypass graft  02/07/2012    Procedure: BYPASS GRAFT FEMORAL-TIBIAL ARTERY;  Surgeon: Rosetta Posner, MD;  Location: [place];  Service: Vascular;  Laterality: Left;  Thrombectomy Left Femoral - Tibial Bypass Graft  . Embolectomy  02/07/2012    Procedure: EMBOLECTOMY;  Surgeon: Rosetta Posner, MD;  Location: [place];  Service: Vascular;  Laterality: Left;  . Femoral-tibial bypass  graft  04/03/2012    Procedure: BYPASS GRAFT FEMORAL-TIBIAL ARTERY;  Surgeon: Mal Misty, MD;  Location: [place];  Service: Vascular;  Laterality: Left;  Redo  . Insert / replace / remove pacemaker  2007  . Coronary angioplasty with stent placement  ~ 2000  . Coronary angioplasty    . Uvulopalatopharyngoplasty (uppp)/tonsillectomy/septoplasty  06/30/2003    Archie Endo 06/30/2003 (07/10/2012)  . Shoulder open rotator cuff repair Right 2001    repair of lacerated right/notes 10/11/1999  (07/10/2012)  . Foot surgery Right 03/20/2001    "have plates and screws in; didn't break it" (07/10/2012)  . Carpal tunnel release Right 2002    Archie Endo 03/20/2001 (07/10/2012)  . Biceps tendon repair Right 2001    Archie Endo 03/20/2001  (07/10/2012)  . Renal artery stent  2009  . Femoral-popliteal bypass graft Left 09/16/2012    Procedure: LEFT FEMORAL-POPLITEAL BYPASS GRAFT WITH GORTEX Propaten Graft 6x80 Thin Wall and Left lower leg Angiogram;  Surgeon: Mal Misty, MD;  Location: [place];  Service: Vascular;  Laterality: Left;  . Colonoscopy      Hx; of  . Tonsillectomy    . Adenoidectomy      Hx; of   . Femoral-tibial bypass graft Left 09/30/2012    Procedure: REDO LEFT FEMORAL-ANTERIOR TIBIAL ARTERY BYPASS USING COMPOSITE CEPHALIC AND BASILIC VEIN GRAFT FROM LEFT ARM;  Surgeon: Mal Misty, MD;  Location: [facility];  Service: Vascular;  Laterality: Left;  . I&d extremity Left 10/21/2012    Procedure: EXPLORATION AND DEBRIDEMENT OF LEFT GROIN WOUND;  Surgeon: Mal Misty, MD;  Location: [place];  Service: Vascular;  Laterality: Left;  . Patch angioplasty Left 10/21/2012    Procedure: PATCH ANGIOPLASTY;  Surgeon: Mal Misty, MD;  Location: [facility];  Service: Vascular;  Laterality: Left;  . Groin debridement Left 11/12/2012    Procedure: CLOSURE INGUINAL WOUND;  Surgeon: Mal Misty, MD;  Location: [place];  Service: Vascular;  Laterality: Left;  . Embolectomy Left 12/16/2012    Procedure: THROMBECTOMY  LEFT LEG BYPASS;  Surgeon: Elam Dutch, MD;  Location: [facility];  Service: Vascular;  Laterality: Left;  . Leg amputation above knee Left 02/04/2013    DR LAWSON  . Amputation Left 02/04/2013    Procedure: AMPUTATION ABOVE KNEE-LEFT;  Surgeon: Mal Misty, MD;  Location: [facility];  Service: Vascular;  Laterality: Left;  . Removal of graft Left 04/16/2013    Procedure: I & D LEFT AKA WOUND, POSSIBLE REMOVAL OF INFECTED GORTEX GRAFT;  Surgeon: Mal Misty, MD;  Location: [facility];  Service: Vascular;  Laterality: Left;  . Abdominal aortagram N/A 09/11/2011    Procedure: ABDOMINAL Maxcine Ham;  Surgeon: Wellington Hampshire, MD;  Location: [facility] CATH LAB;  Service: Cardiovascular;  Laterality: N/A;  . Lower extremity angiogram Left  12/25/2011    Procedure: LOWER EXTREMITY ANGIOGRAM;  Surgeon: Serafina Mitchell, MD;  Location: [facility] CATH LAB;  Service: Cardiovascular;  Laterality: Left;  lt leg angio co2  . Abdominal angiogram  12/25/2011    Procedure: ABDOMINAL ANGIOGRAM;  Surgeon: Serafina Mitchell, MD;  Location: [facility] CATH LAB;  Service: Cardiovascular;;  . Lower extremity angiogram N/A 07/07/2012    Procedure: LOWER EXTREMITY ANGIOGRAM;  Surgeon: Conrad Whittingham, MD;  Location: [facility] CATH LAB;  Service: Cardiovascular;  Laterality: N/A;  . Lower extremity angiogram N/A 09/15/2012    Procedure: LOWER EXTREMITY ANGIOGRAM;  Surgeon: Serafina Mitchell, MD;  Location: [facility] CATH LAB;  Service:  Cardiovascular;  Laterality: N/A;  . Upper extremity angiogram  09/15/2012    Procedure: UPPER EXTREMITY ANGIOGRAM;  Surgeon: Serafina Mitchell, MD;  Location: [facility] CATH LAB;  Service: Cardiovascular;;  . Biv icd genertaor change out N/A 11/09/2013    Procedure: BIV ICD GENERTAOR CHANGE OUT;  Surgeon: Coralyn Mark, MD;  Location: [facility] CATH LAB;  Service: Cardiovascular;  Laterality: N/A;    There were no vitals filed for this visit.  Visit Diagnosis:  Abnormality of gait  Balance problems  Status post above knee amputation of left lower extremity (HCC)  Impaired functional mobility and activity tolerance  Weakness generalized  Activity intolerance     02/10/15 0935  Symptoms/Limitations  Subjective No new complaints. Leaving for [place] next Wed to stay throught holiday (03/01/15). No falls. Still having phantom pain at times.  Pain Assessment  Currently in Pain? Yes  Pain Score 5  Pain Location Leg  Pain Orientation Left  Pain Descriptors / Indicators Throbbing  Pain Type Phantom pain  Pain Onset More than a month ago  Pain Frequency Intermittent  Aggravating Factors  increased prosthetic wear  Pain Relieving Factors removing prosthesis, also discussed rubbing the limb as well to assist with this. Pt does not take medication.      02/10/15 0938   Transfers  Sit to Stand 5: Supervision;With upper extremity assist;With armrests;From chair/3-in-1  Sit to Stand Details Verbal cues for sequencing;Verbal cues for technique  Stand to Sit 5: Supervision;With upper extremity assist;With armrests;To chair/3-in-1  Stand to Sit Details (indicate cue type and reason) Verbal cues for sequencing;Verbal cues for technique  Ambulation/Gait  Ambulation/Gait Yes  Ambulation/Gait Assistance 5: Supervision  Ambulation/Gait Assistance Details cues for equal step length and increase stance time on prosthetic side  Ambulation Distance (Feet) 141 Feet (x1, 135 x1, 75 x1, 80 x1)  Assistive device Prosthesis;Rolling walker  Gait Pattern Step-to pattern;Decreased step length - right;Decreased stance time - left;Decreased stride length;Decreased hip/knee flexion - left;Decreased weight shift to left;Left circumduction;Left hip hike;Antalgic;Trunk flexed;Abducted - left  Ambulation Surface Level;Indoor  Stairs Yes  Stairs Assistance 5: Supervision  Stairs Assistance Details (indicate cue type and reason) cues on hand advancement and sequencing  Stair Management Technique Two rails;Step to pattern;Forwards  Number of Stairs 4  Ramp 5: Supervision (with walker/prosthesis)  Ramp Details (indicate cue type and reason) x 2 reps with cues on technique  Curb 5: Supervision (with walker/prosthesis)  Curb Details (indicate cue type and reason) x2 reps with cues on technique  Prosthetics  Current prosthetic wear tolerance (days/week)  daily  Current prosthetic wear tolerance (#hours/day)  most awake hours, drying as needed (~8-9 hours a day)  Residual limb condition  intact per pt  Education Provided Residual limb care;Correct ply sock adjustment;Proper wear schedule/adjustment;Proper weight-bearing schedule/adjustment  Person(s) Educated Patient  Education Method Explanation;Verbal cues  Education Method Verbalized understanding;Verbal cues required  Donning  Prosthesis 5  Doffing Prosthesis 6          PT Short Term Goals - 02/02/15 1333    PT SHORT TERM GOAL #1   Title donnes prosthesis with new suspension correctly independently. (Target Date: 03/03/2015)   Time 1   Period Months   Status New   PT SHORT TERM GOAL #2   Title demonstrates understanding of initial HEP  (Target Date: 03/03/2015)   Time 1   Period Months   Status New   PT SHORT TERM GOAL #3   Title wears prosthesis >70% of awake hours >/=  6 days /wk.  (Target Date: 03/03/2015)   Time 1   Period Months   Status New   PT SHORT TERM GOAL #4   Title ambulates 100' with rolling walker & prosthesis with cues only for deviations.  (Target Date: 03/03/2015)   Time 1   Period Months   Status New   PT SHORT TERM GOAL #5   Title stands 60sec without UE support with supervision.  (Target Date: 03/03/2015)   Time 1   Period Months   Status New   PT SHORT TERM GOAL #6   Title pt ambulates 74' with a cane along counter or table for contact with LUE with supervision.  (Target Date: 03/03/2015)   Time 1   Period Months   Status New   PT SHORT TERM GOAL #7   Title pt negotiates ramps and curb with RW & prosthesis with supervision.  (Target Date: 03/03/2015)   Time 1   Period Months   Status New   PT SHORT TERM GOAL #8   Title pt able to transfer safely from [facility] to chair with armrests or car without RW with supervision.  (Target Date: 03/03/2015)   Time 1   Period Months   Status New           PT Long Term Goals - 02/02/15 1333    PT LONG TERM GOAL #1   Title wears prosthesis >80% of awake hours daily.  (Target Date: 03/31/2015)   Time 2   Period Months   Status New   PT LONG TERM GOAL #2   Title verbalize understanding of ongoing fitness plan.  (Target Date: 03/31/2015)   Time 2   Period Months   Status New   PT LONG TERM GOAL #3   Title ambulates 250' with rolling walker & prosthesis modified independent.  (Target Date: 03/31/2015)   Time 2   Period Months    Status New   PT LONG TERM GOAL #4   Title Berg Balance >/= 20/56  (Target Date: 03/31/2015)   Time 2   Period Months   Status New   PT LONG TERM GOAL #5   Title negotiate ramp, curb & stairs (1 rail) with rolling walker & prosthesis modified independent.  (Target Date: 03/31/2015)   Time 2   Period Months   Status New   PT LONG TERM GOAL #6   Title ambulates 36' with [facility] & prosthesis modified independent.  (Target Date: 03/31/2015)   Time 2   Period Months   Status New        02/10/15 0938  Plan  Clinical Impression Statement Pt making steady progress toward goals. Continues to need occasional cues on knee control with mobility.  Pt will benefit from skilled therapeutic intervention in order to improve on the following deficits Abnormal gait;Decreased activity tolerance;Decreased balance;Decreased endurance;Decreased knowledge of use of DME;Decreased mobility;Decreased safety awareness;Decreased range of motion;Decreased strength;Impaired flexibility;Postural dysfunction;Prosthetic Dependency  Rehab Potential Good  PT Frequency 2x / week  PT Duration Other (comment) (9 weeks (60 days))  PT Treatment/Interventions ADLs/Self Care Home Management;DME Instruction;Gait training;Stair training;Functional mobility training;Therapeutic activities;Therapeutic exercise;Balance training;Neuromuscular re-education;Patient/family education;Prosthetic Training  PT Next Visit Plan prosthetic gait including barriers and balance activities  Consulted and Agree with Plan of Care Patient       Problem List Patient Active Problem List   Diagnosis Date Noted  . Phantom limb syndrome with pain ([place]) 08/03/2014  . Diabetic polyneuropathy associated with diabetes mellitus due to underlying condition ([place])  07/12/2014  . CVA (cerebral infarction) 06/15/2014  . Expressive aphasia 06/15/2014  . CKD (chronic kidney disease) stage 4, GFR 15-29 ml/min (HCC) 06/15/2014  . DM (diabetes mellitus), type 2,  uncontrolled, with renal complications ([place]) [id number]  . Unilateral AKA ([place]) 02/09/2013  . S/P ICD (internal cardiac defibrillator) procedure 05/18/2012  . PAD (peripheral artery disease) ([place]) 09/04/2011  . CAD (coronary artery disease)   . Chronic systolic congestive heart failure ([place]) 07/06/2010  . Gout 09/18/2007  . Essential hypertension 01/23/2007  . Atrial fibrillation ([place]) 01/23/2007  . BPH (benign prostatic hyperplasia) 01/23/2007    Willow Ora 02/12/2015, 10:30 PM  Willow Ora, PTA, [facility] [address] [place], [place] [address] [phone] 02/12/2015, 10:31 PM   Name: LAVONTAE SATHER MRN: [id number] Date of Birth: [birth year]

## 2015-02-13 ENCOUNTER — Ambulatory Visit: Payer: Commercial Managed Care - HMO | Admitting: Physical Therapy

## 2015-02-20 ENCOUNTER — Encounter: Payer: Commercial Managed Care - HMO | Admitting: Internal Medicine

## 2015-03-02 ENCOUNTER — Encounter: Payer: Self-pay | Admitting: Physical Therapy

## 2015-03-02 ENCOUNTER — Encounter: Payer: Self-pay | Admitting: Family Medicine

## 2015-03-02 ENCOUNTER — Ambulatory Visit: Payer: Commercial Managed Care - HMO | Admitting: Physical Therapy

## 2015-03-02 ENCOUNTER — Ambulatory Visit (INDEPENDENT_AMBULATORY_CARE_PROVIDER_SITE_OTHER): Payer: Commercial Managed Care - HMO | Admitting: General Practice

## 2015-03-02 ENCOUNTER — Telehealth: Payer: Self-pay | Admitting: Cardiology

## 2015-03-02 ENCOUNTER — Other Ambulatory Visit: Payer: Self-pay | Admitting: Family Medicine

## 2015-03-02 ENCOUNTER — Ambulatory Visit (INDEPENDENT_AMBULATORY_CARE_PROVIDER_SITE_OTHER): Payer: Commercial Managed Care - HMO | Admitting: Family Medicine

## 2015-03-02 VITALS — BP 140/77 | HR 78 | Temp 98.1°F

## 2015-03-02 DIAGNOSIS — I4891 Unspecified atrial fibrillation: Secondary | ICD-10-CM

## 2015-03-02 DIAGNOSIS — I482 Chronic atrial fibrillation, unspecified: Secondary | ICD-10-CM

## 2015-03-02 DIAGNOSIS — Z23 Encounter for immunization: Secondary | ICD-10-CM | POA: Diagnosis not present

## 2015-03-02 DIAGNOSIS — R531 Weakness: Secondary | ICD-10-CM

## 2015-03-02 DIAGNOSIS — R269 Unspecified abnormalities of gait and mobility: Secondary | ICD-10-CM

## 2015-03-02 DIAGNOSIS — Z4789 Encounter for other orthopedic aftercare: Secondary | ICD-10-CM

## 2015-03-02 DIAGNOSIS — N3281 Overactive bladder: Secondary | ICD-10-CM

## 2015-03-02 DIAGNOSIS — Z7409 Other reduced mobility: Secondary | ICD-10-CM

## 2015-03-02 DIAGNOSIS — D1720 Benign lipomatous neoplasm of skin and subcutaneous tissue of unspecified limb: Secondary | ICD-10-CM | POA: Diagnosis not present

## 2015-03-02 DIAGNOSIS — R6889 Other general symptoms and signs: Secondary | ICD-10-CM

## 2015-03-02 DIAGNOSIS — M24652 Ankylosis, left hip: Secondary | ICD-10-CM

## 2015-03-02 DIAGNOSIS — Z89612 Acquired absence of left leg above knee: Secondary | ICD-10-CM

## 2015-03-02 DIAGNOSIS — R2689 Other abnormalities of gait and mobility: Secondary | ICD-10-CM

## 2015-03-02 LAB — POCT INR: INR: 2.3

## 2015-03-02 MED ORDER — OXYBUTYNIN CHLORIDE 5 MG PO TABS: 5.0000 mg | ORAL_TABLET | Freq: Two times a day (BID) | ORAL | Status: DC

## 2015-03-02 NOTE — Telephone Encounter (Signed)
LMOVM reminding pt to send remote transmission.   

## 2015-03-02 NOTE — Telephone Encounter (Signed)
Pt needs new rx oxybutynin 50mg  #180w/[facility]ills [facility]. Pt does not want rx to local pharm due to cost

## 2015-03-02 NOTE — Progress Notes (Signed)
Pre visit review using our clinic review tool, if applicable. No additional management support is needed unless otherwise documented below in the visit note. Pt declined to weigh 

## 2015-03-02 NOTE — Therapy (Signed)
[facility] [address] [place], [place], [address] Phone: [phone]   Fax:  [phone]  Physical Therapy Treatment  Patient Details  Name: [name] MRN: [id number] Date of Birth: 05/09/1934 Referring Provider: Alger Simons, MD  Encounter Date: 03/02/2015      PT End of Session - 03/02/15 0930    Visit Number 5   Number of Visits 18   Date for PT Re-Evaluation 03/31/15   Authorization Type Medicare G-code & progress report   PT Start Time 0845   PT Stop Time 0929   PT Time Calculation (min) 44 min   Equipment Utilized During Treatment Gait belt   Activity Tolerance Patient tolerated treatment well   Behavior During Therapy [facility] for tasks assessed/performed      Past Medical History  Diagnosis Date  . Gout   . Benign prostatic hypertrophy     takes Proscar daily  . Atrial fibrillation ([place])     takes Warfarin daily  . Chronic systolic dysfunction of left ventricle     a. mixed ischemic and nonischemic CM,  EF 35%. b. s/p AICD implantation.  . ED (erectile dysfunction)   . Arthritis   . Pacemaker     medtronic  . CAD (coronary artery disease)     a. s/p mid LAD stenting with DES 2008 Dr. Daneen Schick  . ICD (implantable cardiac defibrillator) in place     medtronic, Dr. Rayann Heman  . ICD (implantable cardiac defibrillator) in place   . Sleep apnea     hx of "had surgery for"  . Automatic implantable cardioverter-defibrillator in situ   . Depression   . Numbness and tingling     Hx; 42fleft foot  . PAD (peripheral artery disease) (HCC)     Severe by PV angiogram 09/2011  . [place]al artery stenosis ([place]     s/p stenting 2009  . PAD (peripheral artery disease) (HCC)     s/p multiple LLE bypass grafts; left mid-distal SCA occlusion by 08/2012 duplex  . Hypertension     takes Carvedilol and Losartan daily  . Constipation     takes Miralax daily as needed and Colace daily   . Type II diabetes  mellitus (HCC)     takes Novolog 70/30  . CHF (congestive heart failure) (HCC)     takes Lasix daily  . History of MRSA infection   . Insomnia     TAKES TRAZODONE NIGHTLY    Past Surgical History  Procedure Laterality Date  . Turp vaporization    . Cardiac defibrillator placement  12/26/09    pacemaker combo  . Cervical epidural injection  2013  . Femoral-tibial bypass graft  09/25/2011    Procedure: BYPASS GRAFT FEMORAL-TIBIAL ARTERY;  Surgeon: JMal Misty MD;  [facility]  Service: Vascular;  Laterality: Left;  Left Femoral - Anterior Tibial Bypass;  saphenous vein graft from left leg  . Intraoperative arteriogram  09/25/2011    Procedure: INTRA OPERATIVE ARTERIOGRAM;  Surgeon: JMal Misty MD;  [facility]  Service: Vascular;  Laterality: Left;  . Femoral-tibial bypass graft  02/07/2012    Procedure: BYPASS GRAFT FEMORAL-TIBIAL ARTERY;  Surgeon: TRosetta Posner MD;  [facility]  Service: Vascular;  Laterality: Left;  Thrombectomy Left Femoral - Tibial Bypass Graft  . Embolectomy  02/07/2012    Procedure: EMBOLECTOMY;  Surgeon: TRosetta Posner MD;  [facility]  Service: Vascular;  Laterality: Left;  .  Femoral-tibial bypass graft  04/03/2012    Procedure: BYPASS GRAFT FEMORAL-TIBIAL ARTERY;  Surgeon: Mal Misty, MD;  Location: [place];  Service: Vascular;  Laterality: Left;  Redo  . Insert / replace / remove pacemaker  2007  . Coronary angioplasty with stent placement  ~ 2000  . Coronary angioplasty    . Uvulopalatopharyngoplasty (uppp)/tonsillectomy/septoplasty  06/30/2003    Archie Endo 06/30/2003 (07/10/2012)  . Shoulder open rotator cuff repair Right 2001    repair of lacerated right/notes 10/11/1999  (07/10/2012)  . Foot surgery Right 03/20/2001    "have plates and screws in; didn't break it" (07/10/2012)  . Carpal tunnel release Right 2002    Archie Endo 03/20/2001 (07/10/2012)  . Biceps tendon repair Right 2001    Archie Endo 03/20/2001 (07/10/2012)  . Renal artery stent  2009  .  Femoral-popliteal bypass graft Left 09/16/2012    Procedure: LEFT FEMORAL-POPLITEAL BYPASS GRAFT WITH GORTEX Propaten Graft 6x80 Thin Wall and Left lower leg Angiogram;  Surgeon: Mal Misty, MD;  Location: [facility];  Service: Vascular;  Laterality: Left;  . Colonoscopy      Hx; of  . Tonsillectomy    . Adenoidectomy      Hx; of   . Femoral-tibial bypass graft Left 09/30/2012    Procedure: REDO LEFT FEMORAL-ANTERIOR TIBIAL ARTERY BYPASS USING COMPOSITE CEPHALIC AND BASILIC VEIN GRAFT FROM LEFT ARM;  Surgeon: Mal Misty, MD;  Location: [place];  Service: Vascular;  Laterality: Left;  . I&d extremity Left 10/21/2012    Procedure: EXPLORATION AND DEBRIDEMENT OF LEFT GROIN WOUND;  Surgeon: Mal Misty, MD;  Location: [place];  Service: Vascular;  Laterality: Left;  . Patch angioplasty Left 10/21/2012    Procedure: PATCH ANGIOPLASTY;  Surgeon: Mal Misty, MD;  Location: [facility];  Service: Vascular;  Laterality: Left;  . Groin debridement Left 11/12/2012    Procedure: CLOSURE INGUINAL WOUND;  Surgeon: Mal Misty, MD;  Location: [place];  Service: Vascular;  Laterality: Left;  . Embolectomy Left 12/16/2012    Procedure: THROMBECTOMY  LEFT LEG BYPASS;  Surgeon: Elam Dutch, MD;  Location: [place];  Service: Vascular;  Laterality: Left;  . Leg amputation above knee Left 02/04/2013    DR LAWSON  . Amputation Left 02/04/2013    Procedure: AMPUTATION ABOVE KNEE-LEFT;  Surgeon: Mal Misty, MD;  Location: [facility];  Service: Vascular;  Laterality: Left;  . Removal of graft Left 04/16/2013    Procedure: I & D LEFT AKA WOUND, POSSIBLE REMOVAL OF INFECTED GORTEX GRAFT;  Surgeon: Mal Misty, MD;  Location: [place] [facility];  Service: Vascular;  Laterality: Left;  . Abdominal aortagram N/A 09/11/2011    Procedure: ABDOMINAL Maxcine Ham;  Surgeon: Wellington Hampshire, MD;  [facility] C[facility] LAB;  Service: Cardiovascular;  Laterality: N/A;  . Lower extremity angiogram Left 12/25/2011    Procedure: LOWER EXTREMITY  ANGIOGRAM;  Surgeon: Serafina Mitchell, MD;  Location: [facility] CATH LAB;  Service: Cardiovascular;  Laterality: Left;  lt leg angio co2  . Abdominal angiogram  12/25/2011    Procedure: ABDOMINAL ANGIOGRAM;  Surgeon: Serafina Mitchell, MD;  Location: [facility] CATH LAB;  Service: Cardiovascular;;  . Lower extremity angiogram N/A 07/07/2012    Procedure: LOWER EXTREMITY ANGIOGRAM;  Surgeon: [place] Montmorenci, MD;  Location: [facility] CATH LAB;  Service: Cardiovascular;  Laterality: N/A;  . Lower extremity angiogram N/A 09/15/2012    Procedure: LOWER EXTREMITY ANGIOGRAM;  Surgeon: Serafina Mitchell, MD;  Location: [facility] CATH LAB;  Service: Cardiovascular;  Laterality: N/A;  . Upper extremity angiogram  09/15/2012    Procedure: UPPER EXTREMITY ANGIOGRAM;  Surgeon: Serafina Mitchell, MD;  Location: [facility] CATH LAB;  Service: Cardiovascular;;  . Biv icd genertaor change out N/A 11/09/2013    Procedure: BIV ICD GENERTAOR CHANGE OUT;  Surgeon: Coralyn Mark, MD;  Location: [facility] CATH LAB;  Service: Cardiovascular;  Laterality: N/A;    There were no vitals filed for this visit.  Visit Diagnosis:  Abnormality of gait  Balance problems  Status post above knee amputation of left lower extremity (HCC)  Impaired functional mobility and activity tolerance  Weakness generalized  Activity intolerance  Encounter for prosthetic gait training  Decreased range of motion of hip, left      Subjective Assessment - 03/02/15 0847    Subjective He went to [place] for Christmas. No issues at brother's house. Family reports he is getting around better with new prosthesis. Flight home was issue because he had to change flights.    Currently in Pain? Yes   Pain Score 8    Pain Location Leg   Pain Orientation Left   Pain Descriptors / Indicators Sharp   Pain Type Phantom pain   Pain Onset More than a month ago   Pain Frequency Intermittent  this morning 2-4 times per hr for 15-25 sec up to 8-9/10                         [facility] Adult PT  Treatment/Exercise - 03/02/15 0930    Transfers   Transfers Sit to Stand;Stand to Sit;Squat Pivot Transfers   Sit to Stand 5: Supervision;With upper extremity assist;With armrests;From chair/3-in-1   Sit to Stand Details Verbal cues for sequencing;Verbal cues for technique   Sit to Stand Details (indicate cue type and reason) PT instructed including demo on engaging knee to stabilize   Stand to Sit 5: Supervision;With upper extremity assist;With armrests;To chair/3-in-1   Stand to Sit Details (indicate cue type and reason) Verbal cues for sequencing;Verbal cues for technique   Stand to Sit Details Verbal cues with demo on proper positioning to unlock prosthesis to sit   Squat Pivot Transfers 6: Modified independent (Device/Increase time);With upper extremity assistance  wearing prosthesis with safe technique   Ambulation/Gait   Ambulation/Gait Yes   Ambulation/Gait Assistance 5: Supervision   Ambulation/Gait Assistance Details tactile , verbal & demo on unlocking prosthesis for swing and posture to decrease UE fatigue from propping on RW with flexed posture.   Pt "stiff legs" prosthesis in swing 50-65% of time   Ambulation Distance (Feet) 140 Feet  140' X 3   Assistive device Prosthesis;Rolling walker   Gait Pattern Step-through pattern;Step-to pattern;Decreased stance time - left;Decreased step length - right;Decreased hip/knee flexion - left;Left circumduction;Left hip hike;Trunk flexed   Ambulation Surface Indoor;Level   Stairs Yes   Stairs Assistance 5: Supervision   Stairs Assistance Details (indicate cue type and reason) cues on stance wt shift   Stair Management Technique Two rails;Step to pattern;Forwards   Number of Stairs 4   Ramp 5: Supervision  with RW /prosthesis   Ramp Details (indicate cue type and reason) cues on flexing knee in swing with ascending & technique / wt shift    Curb 5: Supervision  with RW /prosthesis   Curb Details (indicate cue type and reason) verbal  cues on technique   Self-Care   Self-Care Posture   Posture PT demo posture stretches at  door frame with recommendation to perform after each toileting for higher frequency (only takes 1-2 minutes) Supine & sitting stretches to residual limb flexion, extension, abduction & adduction.    Prosthetics   Current prosthetic wear tolerance (days/week)  daily   Current prosthetic wear tolerance (#hours/day)  most awake hours, drying as needed (~8-9 hours a day)   Residual limb condition  intact per pt   Education Provided Correct ply sock adjustment;Proper wear schedule/adjustment;Proper weight-bearing schedule/adjustment   Person(s) Educated Patient   Education Method Explanation   Education Method Verbalized understanding     Patient ambulated along counter with LUE support on counter and single point cane in RUE with supervision and cues on wt shift over prosthesis in stance and proper prosthesis advancement.           PT Education - 03/02/15 0930    Education provided Yes   Education Details stretches for residual limb & upright posture; using pj pants to keep limb warm to help with phantom pain   Person(s) Educated Patient   Methods Explanation;Demonstration;Verbal cues   Comprehension Verbalized understanding;Verbal cues required;Need further instruction          PT Short Term Goals - 03/02/15 0930    PT SHORT TERM GOAL #1   Title donnes prosthesis with new suspension correctly independently. (Target Date: 03/03/2015)   Baseline MET 03/02/2015   Time 1   Period Months   Status Achieved   PT SHORT TERM GOAL #2   Title demonstrates understanding of initial HEP  (Target Date: 03/03/2015)   Baseline MET 03/02/2015   Time 1   Period Months   Status Achieved   PT SHORT TERM GOAL #3   Title wears prosthesis >70% of awake hours >/= 6 days /wk.  (Target Date: 03/03/2015)   Time 1   Period Months   Status On-going   PT SHORT TERM GOAL #4   Title ambulates 100' with rolling  walker & prosthesis with cues only for deviations.  (Target Date: 03/03/2015)   Baseline MET 03/02/2015 He ambulates 140' with RW & prosthesis with cues only for deviations.    Time 1   Period Months   Status Achieved   PT SHORT TERM GOAL #5   Title stands 60sec without UE support with supervision.  (Target Date: 03/03/2015)   Time 1   Period Months   Status On-going   PT SHORT TERM GOAL #6   Title pt ambulates 37' with a cane along counter or table for contact with LUE with supervision.  (Target Date: 03/03/2015)   Baseline MET 03/02/2015   Time 1   Period Months   Status Achieved   PT SHORT TERM GOAL #7   Title pt negotiates ramps and curb with RW & prosthesis with supervision.  (Target Date: 03/03/2015)   Time 1   Period Months   Status New   PT SHORT TERM GOAL #8   Title pt able to transfer safely from [facility] to chair with armrests or car without RW with supervision.  (Target Date: 03/03/2015)   Baseline MET 03/02/2015   Time 1   Period Months   Status Achieved           PT Long Term Goals - 02/02/15 1333    PT LONG TERM GOAL #1   Title wears prosthesis >80% of awake hours daily.  (Target Date: 03/31/2015)   Time 2   Period Months   Status New   PT LONG TERM GOAL #2  Title verbalize understanding of ongoing fitness plan.  (Target Date: 03/31/2015)   Time 2   Period Months   Status New   PT LONG TERM GOAL #3   Title ambulates 250' with rolling walker & prosthesis modified independent.  (Target Date: 03/31/2015)   Time 2   Period Months   Status New   PT LONG TERM GOAL #4   Title Berg Balance >/= 20/56  (Target Date: 03/31/2015)   Time 2   Period Months   Status New   PT LONG TERM GOAL #5   Title negotiate ramp, curb & stairs (1 rail) with rolling walker & prosthesis modified independent.  (Target Date: 03/31/2015)   Time 2   Period Months   Status New   PT LONG TERM GOAL #6   Title ambulates 51' with [facility] & prosthesis modified independent.  (Target Date:  03/31/2015)   Time 2   Period Months   Status New               Plan - 03/02/15 0930    Clinical Impression Statement Patient met 6 of 8 STGs checked today with remaining 2 to be checked tomorrow. Patient improved mobility with new prosthesis but needs further skilled care to maximize mobility with least energy expenditure possible.    Pt will benefit from skilled therapeutic intervention in order to improve on the following deficits Abnormal gait;Decreased activity tolerance;Decreased balance;Decreased endurance;Decreased knowledge of use of DME;Decreased mobility;Decreased safety awareness;Decreased range of motion;Decreased strength;Impaired flexibility;Postural dysfunction;Prosthetic Dependency   Rehab Potential Good   PT Frequency 2x / week   PT Duration Other (comment)  9 weeks (60 days)   PT Treatment/Interventions ADLs/Self Care Home Management;DME Instruction;Gait training;Stair training;Functional mobility training;Therapeutic activities;Therapeutic exercise;Balance training;Neuromuscular re-education;Patient/family education;Prosthetic Training   PT Next Visit Plan Assess remaining 2 STGs and work towards [place] and Agree with Plan of Care Patient        Problem List Patient Active Problem List   Diagnosis Date Noted  . Phantom limb syndrome with pain ([place]) 08/03/2014  . Diabetic polyneuropathy associated with diabetes mellitus due to underlying condition ([place]) 07/12/2014  . CVA (cerebral infarction) 06/15/2014  . Expressive aphasia 06/15/2014  . CKD (chronic kidney disease) stage 4, GFR 15-29 ml/min (HCC) 06/15/2014  . DM (diabetes mellitus), type 2, uncontrolled, with renal complications ([place]) 68/15/9470  . Unilateral AKA ([place]) 02/09/2013  . S/P ICD (internal cardiac defibrillator) procedure 05/18/2012  . PAD (peripheral artery disease) ([place]) 09/04/2011  . CAD (coronary artery disease)   . Chronic systolic congestive heart failure ([place]) 07/06/2010  .  Gout 09/18/2007  . Essential hypertension 01/23/2007  . Atrial fibrillation ([place]) 01/23/2007  . BPH (benign prostatic hyperplasia) 01/23/2007    Jamey Reas PT, DPT 03/02/2015, 1:08 PM  [facility] [address] [place], [place], [address] Phone: [phone]   Fax:  [phone]  Name: [name] MRN: [id number] Date of Birth: [birth year]

## 2015-03-02 NOTE — Progress Notes (Signed)
Pre visit review using our clinic review tool, if applicable. No additional management support is needed unless otherwise documented below in the visit note. 

## 2015-03-03 ENCOUNTER — Encounter: Payer: Self-pay | Admitting: Physical Therapy

## 2015-03-03 ENCOUNTER — Ambulatory Visit: Payer: Commercial Managed Care - HMO | Admitting: Physical Therapy

## 2015-03-03 ENCOUNTER — Encounter: Payer: Self-pay | Admitting: Family Medicine

## 2015-03-03 ENCOUNTER — Telehealth: Payer: Self-pay

## 2015-03-03 DIAGNOSIS — R6889 Other general symptoms and signs: Secondary | ICD-10-CM

## 2015-03-03 DIAGNOSIS — R269 Unspecified abnormalities of gait and mobility: Secondary | ICD-10-CM | POA: Diagnosis not present

## 2015-03-03 DIAGNOSIS — R2689 Other abnormalities of gait and mobility: Secondary | ICD-10-CM

## 2015-03-03 DIAGNOSIS — Z4789 Encounter for other orthopedic aftercare: Secondary | ICD-10-CM

## 2015-03-03 DIAGNOSIS — Z7409 Other reduced mobility: Secondary | ICD-10-CM

## 2015-03-03 DIAGNOSIS — R531 Weakness: Secondary | ICD-10-CM

## 2015-03-03 NOTE — Progress Notes (Signed)
   Subjective:    Patient ID: [name], male    DOB: 14-Mar-1934, 80 y.o.   MRN: [id number]  HPI Here for 2 things. First he noticed a lump on the left arm about 2 weeks ago. It does not bother him at all. It does not seem to be changing. Also he has had some urgency to urinate lately with some occasional leakage of urine. No discomfort. He had a recent urinalysis that was clear.    Review of Systems  Constitutional: Negative.   Respiratory: Negative.   Cardiovascular: Negative.   Genitourinary: Positive for urgency. Negative for dysuria, frequency, flank pain and difficulty urinating.       Objective:   Physical Exam  Constitutional: He appears well-developed and well-nourished.  Cardiovascular: Normal rate, regular rhythm, normal heart sounds and intact distal pulses.   Pulmonary/Chest: Effort normal and breath sounds normal.  Abdominal: Soft. Bowel sounds are normal. He exhibits no distension and no mass. There is no tenderness. There is no rebound and no guarding.  Musculoskeletal:  The left forearm has a firm mobile non-tender mass just under the skin          Assessment & Plan:  He has a lipoma on the arm, and I reassured him this was benign. He will observe this for now. He has OAB and he will try Oxybutynin for this.

## 2015-03-03 NOTE — Therapy (Signed)
[facility] [address] [place], [place], [address] Phone: [phone]   Fax:  [phone]  Physical Therapy Treatment  Patient Details  Name: [name] MRN: [id number] Date of Birth: February 05, 1935 Referring Provider: Alger Simons, MD  Encounter Date: 03/03/2015      PT End of Session - 03/03/15 1107    Visit Number 6   Number of Visits 18   Date for PT Re-Evaluation 03/31/15   Authorization Type Medicare G-code & progress report   PT Start Time 1102   PT Stop Time 1145   PT Time Calculation (min) 43 min   Equipment Utilized During Treatment Gait belt   Activity Tolerance Patient tolerated treatment well   Behavior During Therapy [facility] for tasks assessed/performed      Past Medical History  Diagnosis Date  . Gout   . Benign prostatic hypertrophy     takes Proscar daily  . Atrial fibrillation ([place])     takes Warfarin daily  . Chronic systolic dysfunction of left ventricle     a. mixed ischemic and nonischemic CM,  EF 35%. b. s/p AICD implantation.  . ED (erectile dysfunction)   . Arthritis   . Pacemaker     medtronic  . CAD (coronary artery disease)     a. s/p mid LAD stenting with DES 2008 Dr. Daneen Schick  . ICD (implantable cardiac defibrillator) in place     medtronic, Dr. Rayann Heman  . ICD (implantable cardiac defibrillator) in place   . Sleep apnea     hx of "had surgery for"  . Automatic implantable cardioverter-defibrillator in situ   . Depression   . Numbness and tingling     Hx; 31fleft foot  . PAD (peripheral artery disease) (HCC)     Severe by PV angiogram 09/2011  . Renal artery stenosis ([place]     s/p stenting 2009  . PAD (peripheral artery disease) (HCC)     s/p multiple LLE bypass grafts; left mid-distal SCA occlusion by 08/2012 duplex  . Hypertension     takes Carvedilol and Losartan daily  . Constipation     takes Miralax daily as needed and Colace daily   . Type II diabetes  mellitus (HCC)     takes Novolog 70/30  . CHF (congestive heart failure) (HCC)     takes Lasix daily  . History of MRSA infection   . Insomnia     TAKES TRAZODONE NIGHTLY    Past Surgical History  Procedure Laterality Date  . Turp vaporization    . Cardiac defibrillator placement  12/26/09    pacemaker combo  . Cervical epidural injection  2013  . Femoral-tibial bypass graft  09/25/2011    Procedure: BYPASS GRAFT FEMORAL-TIBIAL ARTERY;  Surgeon: JMal Misty MD;  [facility];  Service: Vascular;  Laterality: Left;  Left Femoral - Anterior Tibial Bypass;  saphenous vein graft from left leg  . Intraoperative arteriogram  09/25/2011    Procedure: INTRA OPERATIVE ARTERIOGRAM;  Surgeon: JMal Misty MD;  [place]  Service: Vascular;  Laterality: Left;  . Femoral-tibial bypass graft  02/07/2012    Procedure: BYPASS GRAFT FEMORAL-TIBIAL ARTERY;  Surgeon: TRosetta Posner MD;  [place] [facility]  Service: Vascular;  Laterality: Left;  Thrombectomy Left Femoral - Tibial Bypass Graft  . Embolectomy  02/07/2012    Procedure: EMBOLECTOMY;  Surgeon: TRosetta Posner MD;  [place] [facility]  Service: Vascular;  Laterality: Left;  .  Femoral-tibial bypass graft  04/03/2012    Procedure: BYPASS GRAFT FEMORAL-TIBIAL ARTERY;  Surgeon: Mal Misty, MD;  Location: [place];  Service: Vascular;  Laterality: Left;  Redo  . Insert / replace / remove pacemaker  2007  . Coronary angioplasty with stent placement  ~ 2000  . Coronary angioplasty    . Uvulopalatopharyngoplasty (uppp)/tonsillectomy/septoplasty  06/30/2003    Archie Endo 06/30/2003 (07/10/2012)  . Shoulder open rotator cuff repair Right 2001    repair of lacerated right/notes 10/11/1999  (07/10/2012)  . Foot surgery Right 03/20/2001    "have plates and screws in; didn't break it" (07/10/2012)  . Carpal tunnel release Right 2002    Archie Endo 03/20/2001 (07/10/2012)  . Biceps tendon repair Right 2001    Archie Endo 03/20/2001 (07/10/2012)  . Renal artery stent  2009  .  Femoral-popliteal bypass graft Left 09/16/2012    Procedure: LEFT FEMORAL-POPLITEAL BYPASS GRAFT WITH GORTEX Propaten Graft 6x80 Thin Wall and Left lower leg Angiogram;  Surgeon: Mal Misty, MD;  Location: [place];  Service: Vascular;  Laterality: Left;  . Colonoscopy      Hx; of  . Tonsillectomy    . Adenoidectomy      Hx; of   . Femoral-tibial bypass graft Left 09/30/2012    Procedure: REDO LEFT FEMORAL-ANTERIOR TIBIAL ARTERY BYPASS USING COMPOSITE CEPHALIC AND BASILIC VEIN GRAFT FROM LEFT ARM;  Surgeon: Mal Misty, MD;  Location: [place];  Service: Vascular;  Laterality: Left;  . I&d extremity Left 10/21/2012    Procedure: EXPLORATION AND DEBRIDEMENT OF LEFT GROIN WOUND;  Surgeon: Mal Misty, MD;  Location: [place];  Service: Vascular;  Laterality: Left;  . Patch angioplasty Left 10/21/2012    Procedure: PATCH ANGIOPLASTY;  Surgeon: Mal Misty, MD;  Location: [place];  Service: Vascular;  Laterality: Left;  . Groin debridement Left 11/12/2012    Procedure: CLOSURE INGUINAL WOUND;  Surgeon: Mal Misty, MD;  Location: [place];  Service: Vascular;  Laterality: Left;  . Embolectomy Left 12/16/2012    Procedure: THROMBECTOMY  LEFT LEG BYPASS;  Surgeon: Elam Dutch, MD;  Location: [place];  Service: Vascular;  Laterality: Left;  . Leg amputation above knee Left 02/04/2013    DR LAWSON  . Amputation Left 02/04/2013    Procedure: AMPUTATION ABOVE KNEE-LEFT;  Surgeon: Mal Misty, MD;  Location: [place];  Service: Vascular;  Laterality: Left;  . Removal of graft Left 04/16/2013    Procedure: I & D LEFT AKA WOUND, POSSIBLE REMOVAL OF INFECTED GORTEX GRAFT;  Surgeon: Mal Misty, MD;  Location: [place];  Service: Vascular;  Laterality: Left;  . Abdominal aortagram N/A 09/11/2011    Procedure: ABDOMINAL Maxcine Ham;  Surgeon: Wellington Hampshire, MD;  [facility] C[facility] LAB;  Service: Cardiovascular;  Laterality: N/A;  . Lower extremity angiogram Left 12/25/2011    Procedure: LOWER EXTREMITY  ANGIOGRAM;  Surgeon: Serafina Mitchell, MD;  Location: [facility] CATH LAB;  Service: Cardiovascular;  Laterality: Left;  lt leg angio co2  . Abdominal angiogram  12/25/2011    Procedure: ABDOMINAL ANGIOGRAM;  Surgeon: Serafina Mitchell, MD;  Location: [facility] CATH LAB;  Service: Cardiovascular;;  . Lower extremity angiogram N/A 07/07/2012    Procedure: LOWER EXTREMITY ANGIOGRAM;  Surgeon: [place] Montmorenci, MD;  Location: [facility] CATH LAB;  Service: Cardiovascular;  Laterality: N/A;  . Lower extremity angiogram N/A 09/15/2012    Procedure: LOWER EXTREMITY ANGIOGRAM;  Surgeon: Serafina Mitchell, MD;  Location: [facility] CATH LAB;  Service: Cardiovascular;  Laterality: N/A;  . Upper extremity angiogram  09/15/2012    Procedure: UPPER EXTREMITY ANGIOGRAM;  Surgeon: Serafina Mitchell, MD;  Location: [facility] CATH LAB;  Service: Cardiovascular;;  . Biv icd genertaor change out N/A 11/09/2013    Procedure: BIV ICD GENERTAOR CHANGE OUT;  Surgeon: Coralyn Mark, MD;  Location: [facility] CATH LAB;  Service: Cardiovascular;  Laterality: N/A;    There were no vitals filed for this visit.  Visit Diagnosis:  Abnormality of gait  Balance problems  Impaired functional mobility and activity tolerance  Weakness generalized  Activity intolerance  Encounter for prosthetic gait training      Subjective Assessment - 03/03/15 1106    Subjective No new complaints. No falls or pain to report. Did have "really bad" phamtom pain yesterday and last night, "brought tears to my eyes". None today.   Currently in Pain? No/denies   Pain Score 0-No pain             [facility] Adult PT Treatment/Exercise - 03/03/15 1109    Transfers   Transfers Sit to Stand;Stand to Sit;Squat Pivot Transfers   Sit to Stand 5: Supervision;With upper extremity assist;With armrests;From chair/3-in-1   Sit to Stand Details Verbal cues for sequencing;Verbal cues for technique   Stand to Sit 5: Supervision;With upper extremity assist;With armrests;To chair/3-in-1   Stand to Sit Details  (indicate cue type and reason) Verbal cues for sequencing;Verbal cues for technique   Comments pt was able to stand 30-40 seconds without UE support to pull up pants and fasten them/belt after readjusting liner due to rotation/position noted with 1st gait trial.                               Ambulation/Gait   Ambulation/Gait Yes   Ambulation/Gait Assistance 5: Supervision   Ambulation/Gait Assistance Details multimodal cues for posture, walker position with gait and prosthetic knee flexion with swing phase.                                   Ambulation Distance (Feet) 176 Feet  x1, 60 x1, 100 x1, 150 x1   Assistive device Prosthesis;Rolling walker   Gait Pattern Step-through pattern;Step-to pattern;Decreased stance time - left;Decreased step length - right;Decreased hip/knee flexion - left;Left circumduction;Left hip hike;Trunk flexed   Ambulation Surface Level;Indoor   Stairs --   Ramp 5: Supervision  with walker/prosthesis   Ramp Details (indicate cue type and reason) cues on posture, sequence and technique.   Curb 5: Supervision  with walker/prosthesis   Curb Details (indicate cue type and reason) cues on sequence and technique.   Prosthetics   Current prosthetic wear tolerance (days/week)  daily   Current prosthetic wear tolerance (#hours/day)  most awake hours, drying as needed (~8-9 hours a day)   Residual limb condition  intact per pt   Education Provided Proper weight-bearing schedule/adjustment;Correct ply sock adjustment;Residual limb care;Proper wear schedule/adjustment   Person(s) Educated Patient   Education Method Explanation   Education Method Verbalized understanding   Donning Prosthesis Supervision   Doffing Prosthesis Modified independent (device/increased time)             PT Education - 03/02/15 0930    Education provided Yes   Education Details stretches for residual limb & upright posture; using pj pants to keep limb warm to help with phantom pain  Person(s) Educated Patient   Methods Explanation;Demonstration;Verbal cues   Comprehension Verbalized understanding;Verbal cues required;Need further instruction          PT Short Term Goals - 03/03/15 1108    PT SHORT TERM GOAL #1   Title donnes prosthesis with new suspension correctly independently. (Target Date: 03/03/2015)   Baseline MET 03/02/2015   Status Achieved   PT SHORT TERM GOAL #2   Title demonstrates understanding of initial HEP  (Target Date: 03/03/2015)   Baseline MET 03/02/2015   Status Achieved   PT SHORT TERM GOAL #3   Title wears prosthesis >70% of awake hours >/= 6 days /wk.  (Target Date: 03/03/2015)   Baseline 03/03/15: wearing most awake hours (8-9 a day)   Status Achieved   PT SHORT TERM GOAL #4   Title ambulates 100' with rolling walker & prosthesis with cues only for deviations.  (Target Date: 03/03/2015)   Baseline MET 03/02/2015 He ambulates 140' with RW & prosthesis with cues only for deviations.    Status Achieved   PT SHORT TERM GOAL #5   Title stands 60sec without UE support with supervision.  (Target Date: 03/03/2015)   Baseline 03/03/15: pt able to stand for ~30 seconds before needed to suppport himself   Status Not Met   PT SHORT TERM GOAL #6   Title pt ambulates 20' with a cane along counter or table for contact with LUE with supervision.  (Target Date: 03/03/2015)   Baseline MET 03/02/2015   Status Achieved   PT SHORT TERM GOAL #7   Title pt negotiates ramps and curb with RW & prosthesis with supervision.  (Target Date: 03/03/2015)   Baseline met on 03/03/15   Time --   Period --   Status Achieved   PT SHORT TERM GOAL #8   Title pt able to transfer safely from [facility] to chair with armrests or car without RW with supervision.  (Target Date: 03/03/2015)   Baseline MET 03/02/2015   Status Achieved           PT Long Term Goals - 02/02/15 1333    PT LONG TERM GOAL #1   Title wears prosthesis >80% of awake hours daily.  (Target Date:  03/31/2015)   Time 2   Period Months   Status New   PT LONG TERM GOAL #2   Title verbalize understanding of ongoing fitness plan.  (Target Date: 03/31/2015)   Time 2   Period Months   Status New   PT LONG TERM GOAL #3   Title ambulates 250' with rolling walker & prosthesis modified independent.  (Target Date: 03/31/2015)   Time 2   Period Months   Status New   PT LONG TERM GOAL #4   Title Berg Balance >/= 20/56  (Target Date: 03/31/2015)   Time 2   Period Months   Status New   PT LONG TERM GOAL #5   Title negotiate ramp, curb & stairs (1 rail) with rolling walker & prosthesis modified independent.  (Target Date: 03/31/2015)   Time 2   Period Months   Status New   PT LONG TERM GOAL #6   Title ambulates 62' with [facility] & prosthesis modified independent.  (Target Date: 03/31/2015)   Time 2   Period Months   Status New           Plan - 03/03/15 1107    Clinical Impression Statement Pt met a total of 7 of 8 STGs. Progressing toward LTG's as well.  Pt will benefit from skilled therapeutic intervention in order to improve on the following deficits Abnormal gait;Decreased activity tolerance;Decreased balance;Decreased endurance;Decreased knowledge of use of DME;Decreased mobility;Decreased safety awareness;Decreased range of motion;Decreased strength;Impaired flexibility;Postural dysfunction;Prosthetic Dependency   Rehab Potential Good   PT Frequency 2x / week   PT Duration Other (comment)  9 weeks (60 days)   PT Treatment/Interventions ADLs/Self Care Home Management;DME Instruction;Gait training;Stair training;Functional mobility training;Therapeutic activities;Therapeutic exercise;Balance training;Neuromuscular re-education;Patient/family education;Prosthetic Training   PT Next Visit Plan  work towards [employer] and Agree with Plan of Care Patient        Problem List Patient Active Problem List   Diagnosis Date Noted  . Phantom limb syndrome with pain ([place]) 08/03/2014   . Diabetic polyneuropathy associated with diabetes mellitus due to underlying condition ([place]) 07/12/2014  . CVA (cerebral infarction) 06/15/2014  . Expressive aphasia 06/15/2014  . CKD (chronic kidney disease) stage 4, GFR 15-29 ml/min (HCC) 06/15/2014  . DM (diabetes mellitus), type 2, uncontrolled, with renal complications ([place]) 62/83/1517  . Unilateral AKA ([place]) 02/09/2013  . S/P ICD (internal cardiac defibrillator) procedure 05/18/2012  . PAD (peripheral artery disease) ([place]) 09/04/2011  . CAD (coronary artery disease)   . Chronic systolic congestive heart failure ([place]) 07/06/2010  . Gout 09/18/2007  . Essential hypertension 01/23/2007  . Atrial fibrillation ([place]) 01/23/2007  . BPH (benign prostatic hyperplasia) 01/23/2007    Willow Ora 03/03/2015, 3:21 PM  Willow Ora, PTA, [place] [address] [place], [place] [address] [phone] 03/03/2015, 3:21 PM   Name: [name] MRN: [id number] Date of Birth: [birth year]

## 2015-03-03 NOTE — Telephone Encounter (Signed)
ICM transmission received.  Attempted patient call and left message for return call.  

## 2015-03-07 ENCOUNTER — Ambulatory Visit: Payer: PPO | Attending: Registered Nurse | Admitting: Physical Therapy

## 2015-03-07 ENCOUNTER — Encounter: Payer: Self-pay | Admitting: Physical Therapy

## 2015-03-07 DIAGNOSIS — R531 Weakness: Secondary | ICD-10-CM | POA: Diagnosis not present

## 2015-03-07 DIAGNOSIS — Z7409 Other reduced mobility: Secondary | ICD-10-CM | POA: Diagnosis not present

## 2015-03-07 DIAGNOSIS — R29818 Other symptoms and signs involving the nervous system: Secondary | ICD-10-CM | POA: Diagnosis not present

## 2015-03-07 DIAGNOSIS — Z89612 Acquired absence of left leg above knee: Secondary | ICD-10-CM | POA: Diagnosis not present

## 2015-03-07 DIAGNOSIS — Z4789 Encounter for other orthopedic aftercare: Secondary | ICD-10-CM

## 2015-03-07 DIAGNOSIS — Z5189 Encounter for other specified aftercare: Secondary | ICD-10-CM | POA: Insufficient documentation

## 2015-03-07 DIAGNOSIS — R6889 Other general symptoms and signs: Secondary | ICD-10-CM | POA: Insufficient documentation

## 2015-03-07 DIAGNOSIS — R269 Unspecified abnormalities of gait and mobility: Secondary | ICD-10-CM | POA: Diagnosis not present

## 2015-03-07 DIAGNOSIS — R2689 Other abnormalities of gait and mobility: Secondary | ICD-10-CM

## 2015-03-07 DIAGNOSIS — M24652 Ankylosis, left hip: Secondary | ICD-10-CM | POA: Diagnosis not present

## 2015-03-07 MED ORDER — OXYBUTYNIN CHLORIDE 5 MG PO TABS: 5.0000 mg | ORAL_TABLET | Freq: Two times a day (BID) | ORAL | Status: DC

## 2015-03-07 NOTE — Therapy (Signed)
[facility] [address] [place], [place], [address] Phone: [phone]   Fax:  [phone]  Physical Therapy Treatment  Patient Details  Name: [name] MRN: [id number] Date of Birth: 1934/06/27 Referring Provider: Faith Rogue, MD  Encounter Date: 03/07/2015      PT End of Session - 03/07/15 1100    Visit Number 7   Number of Visits 18   Date for PT Re-Evaluation 03/31/15   Authorization Type Medicare G-code & progress report   PT Start Time 1015   PT Stop Time 1100   PT Time Calculation (min) 45 min   Equipment Utilized During Treatment Gait belt   Activity Tolerance Patient tolerated treatment well   Behavior During Therapy [facility] for tasks assessed/performed      Past Medical History  Diagnosis Date  . Gout   . Benign prostatic hypertrophy     takes Proscar daily  . Atrial fibrillation (HCC)     takes Warfarin daily  . Chronic systolic dysfunction of left ventricle     a. mixed ischemic and nonischemic CM,  EF 35%. b. s/p AICD implantation.  . ED (erectile dysfunction)   . Arthritis   . Pacemaker     medtronic  . CAD (coronary artery disease)     a. s/p mid LAD stenting with DES 2008 Dr. Verdis Prime  . ICD (implantable cardiac defibrillator) in place     medtronic, Dr. Johney Frame  . ICD (implantable cardiac defibrillator) in place   . Sleep apnea     hx of "had surgery for"  . Automatic implantable cardioverter-defibrillator in situ   . Depression   . Numbness and tingling     Hx; 16f left foot  . PAD (peripheral artery disease) (HCC)     Severe by PV angiogram 09/2011  . Renal artery stenosis (HCC)     s/p stenting 2009  . PAD (peripheral artery disease) (HCC)     s/p multiple LLE bypass grafts; left mid-distal SCA occlusion by 08/2012 duplex  . Hypertension     takes Carvedilol and Losartan daily  . Constipation     takes Miralax daily as needed and Colace daily   . Type II diabetes  mellitus (HCC)     takes Novolog 70/30  . CHF (congestive heart failure) (HCC)     takes Lasix daily  . History of MRSA infection   . Insomnia     TAKES TRAZODONE NIGHTLY    Past Surgical History  Procedure Laterality Date  . Turp vaporization    . Cardiac defibrillator placement  12/26/09    pacemaker combo  . Cervical epidural injection  2013  . Femoral-tibial bypass graft  09/25/2011    Procedure: BYPASS GRAFT FEMORAL-TIBIAL ARTERY;  Surgeon: Pryor Ochoa, MD;  Location[facility] OR;  Service: Vascular;  Laterality: Left;  Left Femoral - Anterior Tibial Bypass;  saphenous vein graft from left leg  . Intraoperative arteriogram  09/25/2011    Procedure: INTRA OPERATIVE ARTERIOGRAM;  Surgeon: Pryor Ochoa, MD;  Location: [facility] OR;  Service: Vascular;  Laterality: Left;  . Femoral-tibial bypass graft  02/07/2012    Procedure: BYPASS GRAFT FEMORAL-TIBIAL ARTERY;  Surgeon: Larina Earthly, MD;  Location: [facility] OR;  Service: Vascular;  Laterality: Left;  Thrombectomy Left Femoral - Tibial Bypass Graft  . Embolectomy  02/07/2012    Procedure: EMBOLECTOMY;  Surgeon: Larina Earthly, MD;  Location: [facility] OR;  Service: Vascular;  Laterality: Left;  .  Femoral-tibial bypass graft  04/03/2012    Procedure: BYPASS GRAFT FEMORAL-TIBIAL ARTERY;  Surgeon: Mal Misty, MD;  Location: [place];  Service: Vascular;  Laterality: Left;  Redo  . Insert / replace / remove pacemaker  2007  . Coronary angioplasty with stent placement  ~ 2000  . Coronary angioplasty    . Uvulopalatopharyngoplasty (uppp)/tonsillectomy/septoplasty  06/30/2003    Archie Endo 06/30/2003 (07/10/2012)  . Shoulder open rotator cuff repair Right 2001    repair of lacerated right/notes 10/11/1999  (07/10/2012)  . Foot surgery Right 03/20/2001    "have plates and screws in; didn't break it" (07/10/2012)  . Carpal tunnel release Right 2002    Archie Endo 03/20/2001 (07/10/2012)  . Biceps tendon repair Right 2001    Archie Endo 03/20/2001 (07/10/2012)  . Renal artery stent  2009  .  Femoral-popliteal bypass graft Left 09/16/2012    Procedure: LEFT FEMORAL-POPLITEAL BYPASS GRAFT WITH GORTEX Propaten Graft 6x80 Thin Wall and Left lower leg Angiogram;  Surgeon: Mal Misty, MD;  Location: [place];  Service: Vascular;  Laterality: Left;  . Colonoscopy      Hx; of  . Tonsillectomy    . Adenoidectomy      Hx; of   . Femoral-tibial bypass graft Left 09/30/2012    Procedure: REDO LEFT FEMORAL-ANTERIOR TIBIAL ARTERY BYPASS USING COMPOSITE CEPHALIC AND BASILIC VEIN GRAFT FROM LEFT ARM;  Surgeon: Mal Misty, MD;  Location: [place];  Service: Vascular;  Laterality: Left;  . I&d extremity Left 10/21/2012    Procedure: EXPLORATION AND DEBRIDEMENT OF LEFT GROIN WOUND;  Surgeon: Mal Misty, MD;  Location: [place];  Service: Vascular;  Laterality: Left;  . Patch angioplasty Left 10/21/2012    Procedure: PATCH ANGIOPLASTY;  Surgeon: Mal Misty, MD;  Location: [place];  Service: Vascular;  Laterality: Left;  . Groin debridement Left 11/12/2012    Procedure: CLOSURE INGUINAL WOUND;  Surgeon: Mal Misty, MD;  Location: [place];  Service: Vascular;  Laterality: Left;  . Embolectomy Left 12/16/2012    Procedure: THROMBECTOMY  LEFT LEG BYPASS;  Surgeon: Elam Dutch, MD;  Location: [place];  Service: Vascular;  Laterality: Left;  . Leg amputation above knee Left 02/04/2013    DR LAWSON  . Amputation Left 02/04/2013    Procedure: AMPUTATION ABOVE KNEE-LEFT;  Surgeon: Mal Misty, MD;  Location: [place];  Service: Vascular;  Laterality: Left;  . Removal of graft Left 04/16/2013    Procedure: I & D LEFT AKA WOUND, POSSIBLE REMOVAL OF INFECTED GORTEX GRAFT;  Surgeon: Mal Misty, MD;  Location: [place];  Service: Vascular;  Laterality: Left;  . Abdominal aortagram N/A 09/11/2011    Procedure: ABDOMINAL Maxcine Ham;  Surgeon: Wellington Hampshire, MD;  Location: [facility] CATH LAB;  Service: Cardiovascular;  Laterality: N/A;  . Lower extremity angiogram Left 12/25/2011    Procedure: LOWER EXTREMITY  ANGIOGRAM;  Surgeon: Serafina Mitchell, MD;  Location: [facility] CATH LAB;  Service: Cardiovascular;  Laterality: Left;  lt leg angio co2  . Abdominal angiogram  12/25/2011    Procedure: ABDOMINAL ANGIOGRAM;  Surgeon: Serafina Mitchell, MD;  Location: [facility] CATH LAB;  Service: Cardiovascular;;  . Lower extremity angiogram N/A 07/07/2012    Procedure: LOWER EXTREMITY ANGIOGRAM;  Surgeon: [place] Montmorenci, MD;  Location: [facility] CATH LAB;  Service: Cardiovascular;  Laterality: N/A;  . Lower extremity angiogram N/A 09/15/2012    Procedure: LOWER EXTREMITY ANGIOGRAM;  Surgeon: Serafina Mitchell, MD;  Location: [facility] CATH LAB;  Service: Cardiovascular;  Laterality: N/A;  . Upper extremity angiogram  09/15/2012    Procedure: UPPER EXTREMITY ANGIOGRAM;  Surgeon: Serafina Mitchell, MD;  Location: [facility] CATH LAB;  Service: Cardiovascular;;  . Biv icd genertaor change out N/A 11/09/2013    Procedure: BIV ICD GENERTAOR CHANGE OUT;  Surgeon: Coralyn Mark, MD;  Location: [facility] CATH LAB;  Service: Cardiovascular;  Laterality: N/A;    There were no vitals filed for this visit.  Visit Diagnosis:  Abnormality of gait  Balance problems  Impaired functional mobility and activity tolerance  Weakness generalized  Activity intolerance  Encounter for prosthetic gait training  Status post above knee amputation of left lower extremity (HCC)      Subjective Assessment - 03/07/15 1020    Subjective No falls or issues. He is wearing prosthesis most of awake hours.    Currently in Pain? No/denies                         [facility] Adult PT Treatment/Exercise - 03/07/15 1015    Transfers   Transfers Sit to Stand;Stand to Sit;Squat Pivot Transfers   Sit to Stand 5: Supervision;With upper extremity assist;With armrests;From chair/3-in-1   Sit to Stand Details Verbal cues for sequencing;Verbal cues for technique   Stand to Sit 5: Supervision;With upper extremity assist;With armrests;To chair/3-in-1   Stand to Sit Details (indicate cue  type and reason) Verbal cues for sequencing;Verbal cues for technique   Comments pt was able to stand 30-40 seconds without UE support to pull up pants and fasten them/belt after readjusting liner due to rotation/position noted with 1st gait trial.                               Ambulation/Gait   Ambulation/Gait Yes   Ambulation/Gait Assistance 5: Supervision   Ambulation/Gait Assistance Details demo, tactile & verbal cues on step length of right LE to load prosthesis to unlock with toe pressure at terminal stance. cues on posture & flexing prosthesis in swing.    Ambulation Distance (Feet) 180 Feet  180', 75', 125'   Assistive device Prosthesis;Rolling walker   Gait Pattern Step-through pattern;Step-to pattern;Decreased stance time - left;Decreased step length - right;Decreased hip/knee flexion - left;Left circumduction;Left hip hike;Trunk flexed   Ambulation Surface Indoor;Level   Ramp 5: Supervision  with walker/prosthesis   Ramp Details (indicate cue type and reason) cues on prosthetic knee control & technique   Curb 5: Supervision  with walker/prosthesis   Curb Details (indicate cue type and reason) cues on technique & prosthetic knee control   Self-Care   Self-Care Posture   Posture standing at doorframe reaching single UE then BUEs to stretch into upright trunk   Prosthetics   Prosthetic Care Comments  PT instructed in proper donning and adjusting ply socks to control internal rotation.    Current prosthetic wear tolerance (days/week)  daily   Current prosthetic wear tolerance (#hours/day)  most awake hours, drying as needed (~8-9 hours a day)   Residual limb condition  intact   Education Provided Proper weight-bearing schedule/adjustment;Correct ply sock adjustment;Residual limb care;Proper wear schedule/adjustment   Person(s) Educated Patient   Education Method Explanation   Education Method Verbalized understanding;Needs further instruction   Donning Prosthesis Supervision    Doffing Prosthesis Modified independent (device/increased time)     Treadmill: PT attempted at 0.76mh to work on fluent knee control but unsafe with patient unable  to lock prosthesis in stance. Technician stopped treadmill per PT command 3 times with only 10-18 seconds of attempted gait. So PT discontinued             PT Short Term Goals - 03/03/15 1108    PT SHORT TERM GOAL #1   Title donnes prosthesis with new suspension correctly independently. (Target Date: 03/03/2015)   Baseline MET 03/02/2015   Status Achieved   PT SHORT TERM GOAL #2   Title demonstrates understanding of initial HEP  (Target Date: 03/03/2015)   Baseline MET 03/02/2015   Status Achieved   PT SHORT TERM GOAL #3   Title wears prosthesis >70% of awake hours >/= 6 days /wk.  (Target Date: 03/03/2015)   Baseline 03/03/15: wearing most awake hours (8-9 a day)   Status Achieved   PT SHORT TERM GOAL #4   Title ambulates 100' with rolling walker & prosthesis with cues only for deviations.  (Target Date: 03/03/2015)   Baseline MET 03/02/2015 He ambulates 140' with RW & prosthesis with cues only for deviations.    Status Achieved   PT SHORT TERM GOAL #5   Title stands 60sec without UE support with supervision.  (Target Date: 03/03/2015)   Baseline 03/03/15: pt able to stand for ~30 seconds before needed to suppport himself   Status Not Met   PT SHORT TERM GOAL #6   Title pt ambulates 69' with a cane along counter or table for contact with LUE with supervision.  (Target Date: 03/03/2015)   Baseline MET 03/02/2015   Status Achieved   PT SHORT TERM GOAL #7   Title pt negotiates ramps and curb with RW & prosthesis with supervision.  (Target Date: 03/03/2015)   Baseline met on 03/03/15   Time --   Period --   Status Achieved   PT SHORT TERM GOAL #8   Title pt able to transfer safely from [facility] to chair with armrests or car without RW with supervision.  (Target Date: 03/03/2015)   Baseline MET 03/02/2015   Status  Achieved           PT Long Term Goals - 03/07/15 1015    PT LONG TERM GOAL #1   Title wears prosthesis >80% of awake hours daily.  (Target Date: 03/31/2015)   Time 2   Period Months   Status On-going   PT LONG TERM GOAL #2   Title verbalize understanding of ongoing fitness plan.  (Target Date: 03/31/2015)   Time 2   Period Months   Status On-going   PT LONG TERM GOAL #3   Title ambulates 250' with rolling walker & prosthesis modified independent.  (Target Date: 03/31/2015)   Time 2   Period Months   Status On-going   PT LONG TERM GOAL #4   Title Berg Balance >/= 20/56  (Target Date: 03/31/2015)   Time 2   Period Months   Status On-going   PT LONG TERM GOAL #5   Title negotiate ramp, curb & stairs (1 rail) with rolling walker & prosthesis modified independent.  (Target Date: 03/31/2015)   Time 2   Period Months   Status On-going   PT LONG TERM GOAL #6   Title ambulates 36' with [facility] & prosthesis modified independent.  (Target Date: 03/31/2015)   Time 2   Period Months   Status On-going               Plan - 03/07/15 1100    Clinical Impression Statement PT attempted  treadmill to work on fluent locking & unlocking of prosthesis but patient unable to control beyond 2-3 steps and appeared unsafe so PT discontinued. Patient needs to work more on flexing the prosthesis in swing to decrease enery expenditure.    Pt will benefit from skilled therapeutic intervention in order to improve on the following deficits Abnormal gait;Decreased activity tolerance;Decreased balance;Decreased endurance;Decreased knowledge of use of DME;Decreased mobility;Decreased safety awareness;Decreased range of motion;Decreased strength;Impaired flexibility;Postural dysfunction;Prosthetic Dependency   Rehab Potential Good   PT Frequency 2x / week   PT Duration Other (comment)  9 weeks (60 days)   PT Treatment/Interventions ADLs/Self Care Home Management;DME Instruction;Gait training;Stair  training;Functional mobility training;Therapeutic activities;Therapeutic exercise;Balance training;Neuromuscular re-education;Patient/family education;Prosthetic Training   PT Next Visit Plan  work towards [employer] and Agree with Plan of Care Patient        Problem List Patient Active Problem List   Diagnosis Date Noted  . Phantom limb syndrome with pain ([place]) 08/03/2014  . Diabetic polyneuropathy associated with diabetes mellitus due to underlying condition ([place]) 07/12/2014  . CVA (cerebral infarction) 06/15/2014  . Expressive aphasia 06/15/2014  . CKD (chronic kidney disease) stage 4, GFR 15-29 ml/min (HCC) 06/15/2014  . DM (diabetes mellitus), type 2, uncontrolled, with renal complications ([place]) 55/37/4827  . Unilateral AKA ([place]) 02/09/2013  . S/P ICD (internal cardiac defibrillator) procedure 05/18/2012  . PAD (peripheral artery disease) ([place]) 09/04/2011  . CAD (coronary artery disease)   . Chronic systolic congestive heart failure ([place]) 07/06/2010  . Gout 09/18/2007  . Essential hypertension 01/23/2007  . Atrial fibrillation ([place]) 01/23/2007  . BPH (benign prostatic hyperplasia) 01/23/2007    Jamey Reas PT, DPT 03/07/2015, 1:54 PM  [place] [address] [place], [place], [address] Phone: [phone]   Fax:  [phone]  Name: [name] MRN: [id number] Date of Birth: 1935-01-08

## 2015-03-07 NOTE — Telephone Encounter (Signed)
Called and spoke with pt to verify mail order pharmacy.  The system only had [employer] and pt request to have prescription sent to [facility].  Per pt sent to [facility], rx was sent and pt is aware.

## 2015-03-08 ENCOUNTER — Telehealth: Payer: Self-pay | Admitting: Family Medicine

## 2015-03-08 NOTE — Telephone Encounter (Signed)
Unable to reach member for ICM follow up.  Carelink impedance showing below baseline in December suggesting fluid retention.  New ICM transmission date 03/29/2015 and letter sent.    FYI for Dr Tamala Julian and Dr Rayann Heman

## 2015-03-08 NOTE — Telephone Encounter (Signed)
Pt call to ask for a note saying that he had his physical by Dr Sarajane Jews. Would like a call back.Marland Kitchen

## 2015-03-09 ENCOUNTER — Ambulatory Visit: Payer: PPO | Admitting: Physical Therapy

## 2015-03-09 ENCOUNTER — Encounter: Payer: Self-pay | Admitting: Physical Therapy

## 2015-03-09 DIAGNOSIS — R531 Weakness: Secondary | ICD-10-CM

## 2015-03-09 DIAGNOSIS — Z7409 Other reduced mobility: Secondary | ICD-10-CM

## 2015-03-09 DIAGNOSIS — R6889 Other general symptoms and signs: Secondary | ICD-10-CM

## 2015-03-09 DIAGNOSIS — R2689 Other abnormalities of gait and mobility: Secondary | ICD-10-CM

## 2015-03-09 DIAGNOSIS — R269 Unspecified abnormalities of gait and mobility: Secondary | ICD-10-CM

## 2015-03-09 DIAGNOSIS — Z4789 Encounter for other orthopedic aftercare: Secondary | ICD-10-CM

## 2015-03-10 NOTE — Therapy (Signed)
[facility] [address] [place], [place], [address] Phone: [phone]   Fax:  [phone]  Physical Therapy Treatment  Patient Details  Name: [name] MRN: [id number] Date of Birth: 1934/12/05 Referring Provider: Alger Simons, MD  Encounter Date: 03/09/2015      PT End of Session - 03/09/15 1108    Visit Number 8   Number of Visits 18   Date for PT Re-Evaluation 03/31/15   Authorization Type Medicare G-code & progress report   PT Start Time 1105   PT Stop Time 1145   PT Time Calculation (min) 40 min   Equipment Utilized During Treatment Gait belt   Activity Tolerance Patient tolerated treatment well   Behavior During Therapy [facility] for tasks assessed/performed      Past Medical History  Diagnosis Date  . Gout   . Benign prostatic hypertrophy     takes Proscar daily  . Atrial fibrillation ([place])     takes Warfarin daily  . Chronic systolic dysfunction of left ventricle     a. mixed ischemic and nonischemic CM,  EF 35%. b. s/p AICD implantation.  . ED (erectile dysfunction)   . Arthritis   . Pacemaker     medtronic  . CAD (coronary artery disease)     a. s/p mid LAD stenting with DES 2008 Dr. Daneen Schick  . ICD (implantable cardiac defibrillator) in place     medtronic, Dr. Rayann Heman  . ICD (implantable cardiac defibrillator) in place   . Sleep apnea     hx of "had surgery for"  . Automatic implantable cardioverter-defibrillator in situ   . Depression   . Numbness and tingling     Hx; 92fleft foot  . PAD (peripheral artery disease) (HCC)     Severe by PV angiogram 09/2011  . Renal artery stenosis ([place]     s/p stenting 2009  . PAD (peripheral artery disease) (HCC)     s/p multiple LLE bypass grafts; left mid-distal SCA occlusion by 08/2012 duplex  . Hypertension     takes Carvedilol and Losartan daily  . Constipation     takes Miralax daily as needed and Colace daily   . Type II diabetes  mellitus (HCC)     takes Novolog 70/30  . CHF (congestive heart failure) (HCC)     takes Lasix daily  . History of MRSA infection   . Insomnia     TAKES TRAZODONE NIGHTLY    Past Surgical History  Procedure Laterality Date  . Turp vaporization    . Cardiac defibrillator placement  12/26/09    pacemaker combo  . Cervical epidural injection  2013  . Femoral-tibial bypass graft  09/25/2011    Procedure: BYPASS GRAFT FEMORAL-TIBIAL ARTERY;  Surgeon: JMal Misty MD;  [facility]  Service: Vascular;  Laterality: Left;  Left Femoral - Anterior Tibial Bypass;  saphenous vein graft from left leg  . Intraoperative arteriogram  09/25/2011    Procedure: INTRA OPERATIVE ARTERIOGRAM;  Surgeon: JMal Misty MD;  [place] [facility]  Service: Vascular;  Laterality: Left;  . Femoral-tibial bypass graft  02/07/2012    Procedure: BYPASS GRAFT FEMORAL-TIBIAL ARTERY;  Surgeon: TRosetta Posner MD;  [facility]  Service: Vascular;  Laterality: Left;  Thrombectomy Left Femoral - Tibial Bypass Graft  . Embolectomy  02/07/2012    Procedure: EMBOLECTOMY;  Surgeon: TRosetta Posner MD;  [facility] [facility]  Service: Vascular;  Laterality: Left;  .  Femoral-tibial bypass graft  04/03/2012    Procedure: BYPASS GRAFT FEMORAL-TIBIAL ARTERY;  Surgeon: Mal Misty, MD;  Location: [facility];  Service: Vascular;  Laterality: Left;  Redo  . Insert / replace / remove pacemaker  2007  . Coronary angioplasty with stent placement  ~ 2000  . Coronary angioplasty    . Uvulopalatopharyngoplasty (uppp)/tonsillectomy/septoplasty  06/30/2003    Archie Endo 06/30/2003 (07/10/2012)  . Shoulder open rotator cuff repair Right 2001    repair of lacerated right/notes 10/11/1999  (07/10/2012)  . Foot surgery Right 03/20/2001    "have plates and screws in; didn't break it" (07/10/2012)  . Carpal tunnel release Right 2002    Archie Endo 03/20/2001 (07/10/2012)  . Biceps tendon repair Right 2001    Archie Endo 03/20/2001 (07/10/2012)  . Renal artery stent  2009  .  Femoral-popliteal bypass graft Left 09/16/2012    Procedure: LEFT FEMORAL-POPLITEAL BYPASS GRAFT WITH GORTEX Propaten Graft 6x80 Thin Wall and Left lower leg Angiogram;  Surgeon: Mal Misty, MD;  Location: [facility];  Service: Vascular;  Laterality: Left;  . Colonoscopy      Hx; of  . Tonsillectomy    . Adenoidectomy      Hx; of   . Femoral-tibial bypass graft Left 09/30/2012    Procedure: REDO LEFT FEMORAL-ANTERIOR TIBIAL ARTERY BYPASS USING COMPOSITE CEPHALIC AND BASILIC VEIN GRAFT FROM LEFT ARM;  Surgeon: Mal Misty, MD;  Location: [facility];  Service: Vascular;  Laterality: Left;  . I&d extremity Left 10/21/2012    Procedure: EXPLORATION AND DEBRIDEMENT OF LEFT GROIN WOUND;  Surgeon: Mal Misty, MD;  Location: [facility];  Service: Vascular;  Laterality: Left;  . Patch angioplasty Left 10/21/2012    Procedure: PATCH ANGIOPLASTY;  Surgeon: Mal Misty, MD;  Location: [facility];  Service: Vascular;  Laterality: Left;  . Groin debridement Left 11/12/2012    Procedure: CLOSURE INGUINAL WOUND;  Surgeon: Mal Misty, MD;  Location: [facility];  Service: Vascular;  Laterality: Left;  . Embolectomy Left 12/16/2012    Procedure: THROMBECTOMY  LEFT LEG BYPASS;  Surgeon: Elam Dutch, MD;  Location: [facility];  Service: Vascular;  Laterality: Left;  . Leg amputation above knee Left 02/04/2013    DR LAWSON  . Amputation Left 02/04/2013    Procedure: AMPUTATION ABOVE KNEE-LEFT;  Surgeon: Mal Misty, MD;  Location: [facility];  Service: Vascular;  Laterality: Left;  . Removal of graft Left 04/16/2013    Procedure: I & D LEFT AKA WOUND, POSSIBLE REMOVAL OF INFECTED GORTEX GRAFT;  Surgeon: Mal Misty, MD;  Location: [facility];  Service: Vascular;  Laterality: Left;  . Abdominal aortagram N/A 09/11/2011    Procedure: ABDOMINAL Maxcine Ham;  Surgeon: Wellington Hampshire, MD;  Location: [facility] CATH LAB;  Service: Cardiovascular;  Laterality: N/A;  . Lower extremity angiogram Left 12/25/2011    Procedure: LOWER EXTREMITY  ANGIOGRAM;  Surgeon: Serafina Mitchell, MD;  Location: [facility] CATH LAB;  Service: Cardiovascular;  Laterality: Left;  lt leg angio co2  . Abdominal angiogram  12/25/2011    Procedure: ABDOMINAL ANGIOGRAM;  Surgeon: Serafina Mitchell, MD;  Location: [facility] CATH LAB;  Service: Cardiovascular;;  . Lower extremity angiogram N/A 07/07/2012    Procedure: LOWER EXTREMITY ANGIOGRAM;  Surgeon: Conrad Kim, MD;  Location: [facility] CATH LAB;  Service: Cardiovascular;  Laterality: N/A;  . Lower extremity angiogram N/A 09/15/2012    Procedure: LOWER EXTREMITY ANGIOGRAM;  Surgeon: Serafina Mitchell, MD;  Location: [facility] CATH LAB;  Service: Cardiovascular;  Laterality: N/A;  . Upper extremity angiogram  09/15/2012    Procedure: UPPER EXTREMITY ANGIOGRAM;  Surgeon: Serafina Mitchell, MD;  Location: [facility] CATH LAB;  Service: Cardiovascular;;  . Biv icd genertaor change out N/A 11/09/2013    Procedure: BIV ICD GENERTAOR CHANGE OUT;  Surgeon: Coralyn Mark, MD;  Location: [facility] CATH LAB;  Service: Cardiovascular;  Laterality: N/A;    There were no vitals filed for this visit.  Visit Diagnosis:  Abnormality of gait  Balance problems  Impaired functional mobility and activity tolerance  Weakness generalized  Activity intolerance  Encounter for prosthetic gait training      Subjective Assessment - 03/09/15 1106    Subjective No new complaints. No falls.   Currently in Pain? Yes   Pain Score 3    Pain Location Leg   Pain Orientation Left   Pain Descriptors / Indicators Stabbing   Pain Type Phantom pain   Pain Onset More than a month ago   Pain Frequency Occasional   Aggravating Factors  increased prosthetic wear, mornings are worse   Pain Relieving Factors removing prosthesis, gabapentin (medication)        03/09/15 1109  Transfers  Transfers Sit to Stand;Stand to Sit;Squat Pivot Transfers  Sit to Stand 5: Supervision;With upper extremity assist;With armrests;From chair/3-in-1  Sit to Stand Details Verbal cues for  sequencing;Verbal cues for technique  Stand to Sit 5: Supervision;With upper extremity assist;With armrests;To chair/3-in-1  Stand to Sit Details (indicate cue type and reason) Verbal cues for sequencing;Verbal cues for technique  Ambulation/Gait  Ambulation/Gait Yes  Ambulation/Gait Assistance 5: Supervision  Ambulation/Gait Assistance Details cues on posture, step length, to weight shift onto right prosthesis, for prosthetic knee flexion with swing phase and for decreased swing height with prosthesis during advancement to prevent knee from locking in air and then unlocking before intital contact                        Ambulation Distance (Feet) 125 Feet (x1, 90 x2, 90 x1 with ramp/curb included, 90 x1 +stairs)  Assistive device Prosthesis;Rolling walker  Gait Pattern Step-through pattern;Step-to pattern;Decreased stance time - left;Decreased step length - right;Decreased hip/knee flexion - left;Left circumduction;Left hip hike;Trunk flexed  Ambulation Surface Level;Indoor  Stairs Yes  Stairs Assistance 5: Supervision  Stairs Assistance Details (indicate cue type and reason) cues on posture, to advance hand along rails to assist with weight shift and sequency  Stair Management Technique Two rails;Step to pattern;Forwards  Number of Stairs 4  Ramp 5: Supervision (with walker and prosthesis)  Ramp Details (indicate cue type and reason) cues on posture, step length and sequency  Curb 5: Supervision (with walker and prosthesis)  Curb Details (indicate cue type and reason) cues on stance position with walker advancement and posture  Prosthetics  Current prosthetic wear tolerance (days/week)  daily  Current prosthetic wear tolerance (#hours/day)  most awake hours, drying as needed (~8-9 hours a day)  Residual limb condition  intact  Education Provided Proper weight-bearing schedule/adjustment;Correct ply sock adjustment;Residual limb care;Proper wear schedule/adjustment  Person(s) Educated  Patient  Education Method Explanation;Demonstration;Verbal cues  Education Method Verbalized understanding;Needs further instruction  Donning Prosthesis 5  Doffing Prosthesis 6           PT Short Term Goals - 03/03/15 1108    PT SHORT TERM GOAL #1   Title donnes prosthesis with new suspension correctly independently. (Target Date: 03/03/2015)   Baseline MET 03/02/2015  Status Achieved   PT SHORT TERM GOAL #2   Title demonstrates understanding of initial HEP  (Target Date: 03/03/2015)   Baseline MET 03/02/2015   Status Achieved   PT SHORT TERM GOAL #3   Title wears prosthesis >70% of awake hours >/= 6 days /wk.  (Target Date: 03/03/2015)   Baseline 03/03/15: wearing most awake hours (8-9 a day)   Status Achieved   PT SHORT TERM GOAL #4   Title ambulates 100' with rolling walker & prosthesis with cues only for deviations.  (Target Date: 03/03/2015)   Baseline MET 03/02/2015 He ambulates 140' with RW & prosthesis with cues only for deviations.    Status Achieved   PT SHORT TERM GOAL #5   Title stands 60sec without UE support with supervision.  (Target Date: 03/03/2015)   Baseline 03/03/15: pt able to stand for ~30 seconds before needed to suppport himself   Status Not Met   PT SHORT TERM GOAL #6   Title pt ambulates 53' with a cane along counter or table for contact with LUE with supervision.  (Target Date: 03/03/2015)   Baseline MET 03/02/2015   Status Achieved   PT SHORT TERM GOAL #7   Title pt negotiates ramps and curb with RW & prosthesis with supervision.  (Target Date: 03/03/2015)   Baseline met on 03/03/15   Time --   Period --   Status Achieved   PT SHORT TERM GOAL #8   Title pt able to transfer safely from [facility] to chair with armrests or car without RW with supervision.  (Target Date: 03/03/2015)   Baseline MET 03/02/2015   Status Achieved           PT Long Term Goals - 03/07/15 1015    PT LONG TERM GOAL #1   Title wears prosthesis >80% of awake hours daily.   (Target Date: 03/31/2015)   Time 2   Period Months   Status On-going   PT LONG TERM GOAL #2   Title verbalize understanding of ongoing fitness plan.  (Target Date: 03/31/2015)   Time 2   Period Months   Status On-going   PT LONG TERM GOAL #3   Title ambulates 250' with rolling walker & prosthesis modified independent.  (Target Date: 03/31/2015)   Time 2   Period Months   Status On-going   PT LONG TERM GOAL #4   Title Berg Balance >/= 20/56  (Target Date: 03/31/2015)   Time 2   Period Months   Status On-going   PT LONG TERM GOAL #5   Title negotiate ramp, curb & stairs (1 rail) with rolling walker & prosthesis modified independent.  (Target Date: 03/31/2015)   Time 2   Period Months   Status On-going   PT LONG TERM GOAL #6   Title ambulates 77' with [facility] & prosthesis modified independent.  (Target Date: 03/31/2015)   Time 2   Period Months   Status On-going        03/09/15 1108  Plan  Clinical Impression Statement Skilled session focused on prosthetic weight shifting and use of prosthetic knee with gait. Pt continues to "stiff leg" prosthesis with advancement vs using knee flexion. Pt needs further training on this. Pt making slow, steady progress toward goals.  Pt will benefit from skilled therapeutic intervention in order to improve on the following deficits Abnormal gait;Decreased activity tolerance;Decreased balance;Decreased endurance;Decreased knowledge of use of DME;Decreased mobility;Decreased safety awareness;Decreased range of motion;Decreased strength;Impaired flexibility;Postural dysfunction;Prosthetic Dependency  Rehab Potential Good  PT Frequency 2x / week  PT Duration Other (comment) (9 weeks (60 days))  PT Treatment/Interventions ADLs/Self Care Home Management;DME Instruction;Gait training;Stair training;Functional mobility training;Therapeutic activities;Therapeutic exercise;Balance training;Neuromuscular re-education;Patient/family education;Prosthetic Training   PT Next Visit Plan work towards [employer] and Agree with Plan of Care Patient        Problem List Patient Active Problem List   Diagnosis Date Noted  . Phantom limb syndrome with pain ([place]) 08/03/2014  . Diabetic polyneuropathy associated with diabetes mellitus due to underlying condition ([place]) 07/12/2014  . CVA (cerebral infarction) 06/15/2014  . Expressive aphasia 06/15/2014  . CKD (chronic kidney disease) stage 4, GFR 15-29 ml/min (HCC) 06/15/2014  . DM (diabetes mellitus), type 2, uncontrolled, with renal complications ([place]) 62/26/3335  . Unilateral AKA ([place]) 02/09/2013  . S/P ICD (internal cardiac defibrillator) procedure 05/18/2012  . PAD (peripheral artery disease) ([place]) 09/04/2011  . CAD (coronary artery disease)   . Chronic systolic congestive heart failure ([place]) 07/06/2010  . Gout 09/18/2007  . Essential hypertension 01/23/2007  . Atrial fibrillation ([place]) 01/23/2007  . BPH (benign prostatic hyperplasia) 01/23/2007    Willow Ora 03/10/2015, 1:52 PM  Willow Ora, PTA, [facility] [address] [place], [place] [address] [phone] 03/10/2015, 2:00 PM   Name: [name] MRN: [id number] Date of Birth: [birth year]

## 2015-03-10 NOTE — Telephone Encounter (Signed)
The note is ready

## 2015-03-10 NOTE — Telephone Encounter (Signed)
Note was faxed to pt

## 2015-03-13 ENCOUNTER — Ambulatory Visit: Payer: PPO | Admitting: Physical Therapy

## 2015-03-14 ENCOUNTER — Encounter: Payer: Commercial Managed Care - HMO | Admitting: Physical Therapy

## 2015-03-14 ENCOUNTER — Telehealth: Payer: Self-pay | Admitting: Family Medicine

## 2015-03-14 NOTE — Telephone Encounter (Signed)
FYI Pt has a new pharmacy  [facility] fax # [phone]. PT does not needs any medication send to new pharm yet.

## 2015-03-14 NOTE — Telephone Encounter (Signed)
I updated new pharmacy in pt's chart.

## 2015-03-16 ENCOUNTER — Ambulatory Visit: Payer: PPO | Admitting: Physical Therapy

## 2015-03-16 ENCOUNTER — Encounter: Payer: Self-pay | Admitting: Physical Therapy

## 2015-03-16 ENCOUNTER — Encounter: Payer: Commercial Managed Care - HMO | Admitting: Physical Therapy

## 2015-03-16 DIAGNOSIS — R6889 Other general symptoms and signs: Secondary | ICD-10-CM

## 2015-03-16 DIAGNOSIS — R269 Unspecified abnormalities of gait and mobility: Secondary | ICD-10-CM

## 2015-03-16 DIAGNOSIS — R2689 Other abnormalities of gait and mobility: Secondary | ICD-10-CM

## 2015-03-16 DIAGNOSIS — Z4789 Encounter for other orthopedic aftercare: Secondary | ICD-10-CM

## 2015-03-16 DIAGNOSIS — Z7409 Other reduced mobility: Secondary | ICD-10-CM

## 2015-03-16 DIAGNOSIS — Z89612 Acquired absence of left leg above knee: Secondary | ICD-10-CM

## 2015-03-16 DIAGNOSIS — R531 Weakness: Secondary | ICD-10-CM

## 2015-03-16 NOTE — Therapy (Signed)
[facility] [address] [place], [place], [address] Phone: [phone]   Fax:  [phone]  Physical Therapy Treatment  Patient Details  Name: [name] MRN: [id number] Date of Birth: [birth year] Referring Provider: Alger Simons, MD  Encounter Date: 03/16/2015      PT End of Session - 03/16/15 1145    Visit Number 9   Number of Visits 18   Date for PT Re-Evaluation 03/31/15   Authorization Type Medicare G-code & progress report   PT Start Time 1103   PT Stop Time 1144   PT Time Calculation (min) 41 min   Equipment Utilized During Treatment Gait belt   Activity Tolerance Patient tolerated treatment well   Behavior During Therapy [facility] for tasks assessed/performed      Past Medical History  Diagnosis Date  . Gout   . Benign prostatic hypertrophy     takes Proscar daily  . Atrial fibrillation ([place])     takes Warfarin daily  . Chronic systolic dysfunction of left ventricle     a. mixed ischemic and nonischemic CM,  EF 35%. b. s/p AICD implantation.  . ED (erectile dysfunction)   . Arthritis   . Pacemaker     medtronic  . CAD (coronary artery disease)     a. s/p mid LAD stenting with DES 2008 Dr. Daneen Schick  . ICD (implantable cardiac defibrillator) in place     medtronic, Dr. Rayann Heman  . ICD (implantable cardiac defibrillator) in place   . Sleep apnea     hx of "had surgery for"  . Automatic implantable cardioverter-defibrillator in situ   . Depression   . Numbness and tingling     Hx; 30fleft foot  . PAD (peripheral artery disease) (HCC)     Severe by PV angiogram 09/2011  . Renal artery stenosis ([place]     s/p stenting 2009  . PAD (peripheral artery disease) (HCC)     s/p multiple LLE bypass grafts; left mid-distal SCA occlusion by 08/2012 duplex  . Hypertension     takes Carvedilol and Losartan daily  . Constipation     takes Miralax daily as needed and Colace daily   . Type II diabetes  mellitus (HCC)     takes Novolog 70/30  . CHF (congestive heart failure) (HCC)     takes Lasix daily  . History of MRSA infection   . Insomnia     TAKES TRAZODONE NIGHTLY    Past Surgical History  Procedure Laterality Date  . Turp vaporization    . Cardiac defibrillator placement  12/26/09    pacemaker combo  . Cervical epidural injection  2013  . Femoral-tibial bypass graft  09/25/2011    Procedure: BYPASS GRAFT FEMORAL-TIBIAL ARTERY;  Surgeon: JMal Misty MD;  [facility];  Service: Vascular;  Laterality: Left;  Left Femoral - Anterior Tibial Bypass;  saphenous vein graft from left leg  . Intraoperative arteriogram  09/25/2011    Procedure: INTRA OPERATIVE ARTERIOGRAM;  Surgeon: JMal Misty MD;  [place] [facility]  Service: Vascular;  Laterality: Left;  . Femoral-tibial bypass graft  02/07/2012    Procedure: BYPASS GRAFT FEMORAL-TIBIAL ARTERY;  Surgeon: TRosetta Posner MD;  [facility] [facility]  Service: Vascular;  Laterality: Left;  Thrombectomy Left Femoral - Tibial Bypass Graft  . Embolectomy  02/07/2012    Procedure: EMBOLECTOMY;  Surgeon: TRosetta Posner MD;  [facility]  Service: Vascular;  Laterality: Left;  .  Femoral-tibial bypass graft  04/03/2012    Procedure: BYPASS GRAFT FEMORAL-TIBIAL ARTERY;  Surgeon: Mal Misty, MD;  Location[place];  Service: Vascular;  Laterality: Left;  Redo  . Insert / replace / remove pacemaker  2007  . Coronary angioplasty with stent placement  ~ 2000  . Coronary angioplasty    . Uvulopalatopharyngoplasty (uppp)/tonsillectomy/septoplasty  06/30/2003    Archie Endo 06/30/2003 (07/10/2012)  . Shoulder open rotator cuff repair Right 2001    repair of lacerated right/notes 10/11/1999  (07/10/2012)  . Foot surgery Right 03/20/2001    "have plates and screws in; didn't break it" (07/10/2012)  . Carpal tunnel release Right 2002    Archie Endo 03/20/2001 (07/10/2012)  . Biceps tendon repair Right 2001    Archie Endo 03/20/2001 (07/10/2012)  . Renal artery stent  2009  .  Femoral-popliteal bypass graft Left 09/16/2012    Procedure: LEFT FEMORAL-POPLITEAL BYPASS GRAFT WITH GORTEX Propaten Graft 6x80 Thin Wall and Left lower leg Angiogram;  Surgeon: Mal Misty, MD;  Location: [facility];  Service: Vascular;  Laterality: Left;  . Colonoscopy      Hx; of  . Tonsillectomy    . Adenoidectomy      Hx; of   . Femoral-tibial bypass graft Left 09/30/2012    Procedure: REDO LEFT FEMORAL-ANTERIOR TIBIAL ARTERY BYPASS USING COMPOSITE CEPHALIC AND BASILIC VEIN GRAFT FROM LEFT ARM;  Surgeon: Mal Misty, MD;  Location: [place];  Service: Vascular;  Laterality: Left;  . I&d extremity Left 10/21/2012    Procedure: EXPLORATION AND DEBRIDEMENT OF LEFT GROIN WOUND;  Surgeon: Mal Misty, MD;  Location: [place];  Service: Vascular;  Laterality: Left;  . Patch angioplasty Left 10/21/2012    Procedure: PATCH ANGIOPLASTY;  Surgeon: Mal Misty, MD;  Location: [facility];  Service: Vascular;  Laterality: Left;  . Groin debridement Left 11/12/2012    Procedure: CLOSURE INGUINAL WOUND;  Surgeon: Mal Misty, MD;  Location: [place];  Service: Vascular;  Laterality: Left;  . Embolectomy Left 12/16/2012    Procedure: THROMBECTOMY  LEFT LEG BYPASS;  Surgeon: Elam Dutch, MD;  Location: [place];  Service: Vascular;  Laterality: Left;  . Leg amputation above knee Left 02/04/2013    DR LAWSON  . Amputation Left 02/04/2013    Procedure: AMPUTATION ABOVE KNEE-LEFT;  Surgeon: Mal Misty, MD;  Location: [facility];  Service: Vascular;  Laterality: Left;  . Removal of graft Left 04/16/2013    Procedure: I & D LEFT AKA WOUND, POSSIBLE REMOVAL OF INFECTED GORTEX GRAFT;  Surgeon: Mal Misty, MD;  Location: [place];  Service: Vascular;  Laterality: Left;  . Abdominal aortagram N/A 09/11/2011    Procedure: ABDOMINAL Maxcine Ham;  Surgeon: Wellington Hampshire, MD;  Location[facility] CATH LAB;  Service: Cardiovascular;  Laterality: N/A;  . Lower extremity angiogram Left 12/25/2011    Procedure: LOWER EXTREMITY  ANGIOGRAM;  Surgeon: Serafina Mitchell, MD;  Location: [facility] CATH LAB;  Service: Cardiovascular;  Laterality: Left;  lt leg angio co2  . Abdominal angiogram  12/25/2011    Procedure: ABDOMINAL ANGIOGRAM;  Surgeon: Serafina Mitchell, MD;  Location: [facility] CATH LAB;  Service: Cardiovascular;;  . Lower extremity angiogram N/A 07/07/2012    Procedure: LOWER EXTREMITY ANGIOGRAM;  Surgeon: [place] Montmorenci, MD;  Location: [facility] CATH LAB;  Service: Cardiovascular;  Laterality: N/A;  . Lower extremity angiogram N/A 09/15/2012    Procedure: LOWER EXTREMITY ANGIOGRAM;  Surgeon: Serafina Mitchell, MD;  Location: [facility] CATH LAB;  Service: Cardiovascular;  Laterality: N/A;  . Upper extremity angiogram  09/15/2012    Procedure: UPPER EXTREMITY ANGIOGRAM;  Surgeon: Serafina Mitchell, MD;  Location: [facility] CATH LAB;  Service: Cardiovascular;;  . Biv icd genertaor change out N/A 11/09/2013    Procedure: BIV ICD GENERTAOR CHANGE OUT;  Surgeon: Coralyn Mark, MD;  Location: [facility] CATH LAB;  Service: Cardiovascular;  Laterality: N/A;    There were no vitals filed for this visit.  Visit Diagnosis:  Abnormality of gait  Balance problems  Impaired functional mobility and activity tolerance  Weakness generalized  Activity intolerance  Encounter for prosthetic gait training  Status post above knee amputation of left lower extremity (HCC)      Subjective Assessment - 03/16/15 1104    Subjective Going to prosthetist after PT today to have socket checked as uncomfortable in groin and buttocks. He did walk in snow with no issues.    Currently in Pain? No/denies                         [facility] Adult PT Treatment/Exercise - 03/16/15 1145    Transfers   Transfers Sit to Stand;Stand to Sit;Squat Pivot Transfers   Sit to Stand 5: Supervision;With upper extremity assist;With armrests;From chair/3-in-1  chairs with & without armrests   Sit to Stand Details Verbal cues for sequencing;Verbal cues for technique   Sit to Stand Details  (indicate cue type and reason) verbal cues on prosthetic knee control, technique with chairs without armrests   Stand to Sit 5: Supervision;With upper extremity assist;With armrests;To chair/3-in-1  chairs with & without armrests   Stand to Sit Details (indicate cue type and reason) Verbal cues for sequencing;Verbal cues for technique   Stand to Sit Details verbal cues on prosthetic knee control   Ambulation/Gait   Ambulation/Gait Yes   Ambulation/Gait Assistance 5: Supervision;4: Min assist  SBA RW; MinA [facility]   Ambulation/Gait Assistance Details Verbal cues on unlocking prosthesis by rolling off toe in terminal stance and initial contact with heel without lifting up for high step; wt shift with pelvis over prothesis in stance especially with [facility]                     Ambulation Distance (Feet) 150 Feet  150' & 100' with RW; 20' X 2 with [facility]   Assistive device Prosthesis;Rolling walker;Small based quad cane   Gait Pattern Step-through pattern;Step-to pattern;Decreased stance time - left;Decreased step length - right;Decreased hip/knee flexion - left;Left circumduction;Left hip hike;Trunk flexed   Ambulation Surface Indoor;Level   Stairs Yes   Stairs Assistance 5: Supervision   Stairs Assistance Details (indicate cue type and reason) cues on wt shift over prosthesis in stance   Stair Management Technique Two rails;Step to pattern;Forwards   Number of Stairs 4   Ramp 5: Supervision  with walker and prosthesis   Ramp Details (indicate cue type and reason) cues on wt shift over prosthesis   Curb 5: Supervision  with walker and prosthesis   Curb Details (indicate cue type and reason) cues on keeping prosthetic knee locked   Self-Care   Self-Care ADL's   ADL's Moving & reaching for objects in kitchen in upper cabinet lower shelf and lower cabinet lower shelf, moving pot along counter on hot pad, retrieve items from frig to counter - all with [facility] with counter support with minA   Prosthetics    Current prosthetic wear tolerance (days/week)  daily  Current prosthetic wear tolerance (#hours/day)  most awake hours, drying as needed (~8-9 hours a day)   Residual limb condition  intact   Education Provided Proper weight-bearing schedule/adjustment;Correct ply sock adjustment;Residual limb care;Proper wear schedule/adjustment   Person(s) Educated Patient   Education Method Explanation;Verbal cues   Education Method Verbalized understanding                  PT Short Term Goals - 03/03/15 1108    PT SHORT TERM GOAL #1   Title donnes prosthesis with new suspension correctly independently. (Target Date: 03/03/2015)   Baseline MET 03/02/2015   Status Achieved   PT SHORT TERM GOAL #2   Title demonstrates understanding of initial HEP  (Target Date: 03/03/2015)   Baseline MET 03/02/2015   Status Achieved   PT SHORT TERM GOAL #3   Title wears prosthesis >70% of awake hours >/= 6 days /wk.  (Target Date: 03/03/2015)   Baseline 03/03/15: wearing most awake hours (8-9 a day)   Status Achieved   PT SHORT TERM GOAL #4   Title ambulates 100' with rolling walker & prosthesis with cues only for deviations.  (Target Date: 03/03/2015)   Baseline MET 03/02/2015 He ambulates 140' with RW & prosthesis with cues only for deviations.    Status Achieved   PT SHORT TERM GOAL #5   Title stands 60sec without UE support with supervision.  (Target Date: 03/03/2015)   Baseline 03/03/15: pt able to stand for ~30 seconds before needed to suppport himself   Status Not Met   PT SHORT TERM GOAL #6   Title pt ambulates 15' with a cane along counter or table for contact with LUE with supervision.  (Target Date: 03/03/2015)   Baseline MET 03/02/2015   Status Achieved   PT SHORT TERM GOAL #7   Title pt negotiates ramps and curb with RW & prosthesis with supervision.  (Target Date: 03/03/2015)   Baseline met on 03/03/15   Time --   Period --   Status Achieved   PT SHORT TERM GOAL #8   Title pt able  to transfer safely from [facility] to chair with armrests or car without RW with supervision.  (Target Date: 03/03/2015)   Baseline MET 03/02/2015   Status Achieved           PT Long Term Goals - 03/07/15 1015    PT LONG TERM GOAL #1   Title wears prosthesis >80% of awake hours daily.  (Target Date: 03/31/2015)   Time 2   Period Months   Status On-going   PT LONG TERM GOAL #2   Title verbalize understanding of ongoing fitness plan.  (Target Date: 03/31/2015)   Time 2   Period Months   Status On-going   PT LONG TERM GOAL #3   Title ambulates 250' with rolling walker & prosthesis modified independent.  (Target Date: 03/31/2015)   Time 2   Period Months   Status On-going   PT LONG TERM GOAL #4   Title Berg Balance >/= 20/56  (Target Date: 03/31/2015)   Time 2   Period Months   Status On-going   PT LONG TERM GOAL #5   Title negotiate ramp, curb & stairs (1 rail) with rolling walker & prosthesis modified independent.  (Target Date: 03/31/2015)   Time 2   Period Months   Status On-going   PT LONG TERM GOAL #6   Title ambulates 46' with [facility] & prosthesis modified independent.  (Target Date: 03/31/2015)  Time 2   Period Months   Status On-going               Plan - 03/16/15 1145    Clinical Impression Statement Patient improved safety with kitchen activiies with [facility] with instruction. He needs counter support with LUE to use SBQC safely. Patient is on target to meet LTGs.   Pt will benefit from skilled therapeutic intervention in order to improve on the following deficits Abnormal gait;Decreased activity tolerance;Decreased balance;Decreased endurance;Decreased knowledge of use of DME;Decreased mobility;Decreased safety awareness;Decreased range of motion;Decreased strength;Impaired flexibility;Postural dysfunction;Prosthetic Dependency   Rehab Potential Good   PT Frequency 2x / week   PT Duration Other (comment)  9 weeks (60 days)   PT Treatment/Interventions ADLs/Self Care Home  Management;DME Instruction;Gait training;Stair training;Functional mobility training;Therapeutic activities;Therapeutic exercise;Balance training;Neuromuscular re-education;Patient/family education;Prosthetic Training   PT Next Visit Plan  Do G-code & progress note next visit, work towards [employer] and Agree with Plan of Care Patient        Problem List Patient Active Problem List   Diagnosis Date Noted  . Phantom limb syndrome with pain ([place]) 08/03/2014  . Diabetic polyneuropathy associated with diabetes mellitus due to underlying condition ([place]) 07/12/2014  . CVA (cerebral infarction) 06/15/2014  . Expressive aphasia 06/15/2014  . CKD (chronic kidney disease) stage 4, GFR 15-29 ml/min (HCC) 06/15/2014  . DM (diabetes mellitus), type 2, uncontrolled, with renal complications ([place]) 47/11/6281  . Unilateral AKA ([place]) 02/09/2013  . S/P ICD (internal cardiac defibrillator) procedure 05/18/2012  . PAD (peripheral artery disease) ([place]) 09/04/2011  . CAD (coronary artery disease)   . Chronic systolic congestive heart failure ([place]) 07/06/2010  . Gout 09/18/2007  . Essential hypertension 01/23/2007  . Atrial fibrillation ([place]) 01/23/2007  . BPH (benign prostatic hyperplasia) 01/23/2007    Jamey Reas PT, DPT 03/16/2015, 3:46 PM  [facility] [address] [place], [place], [address] Phone: [phone]   Fax:  [phone]  Name: RANSON BELLUOMINI MRN: [id number] Date of Birth: 11/11/1934

## 2015-03-21 ENCOUNTER — Ambulatory Visit: Payer: PPO | Admitting: Physical Therapy

## 2015-03-21 ENCOUNTER — Encounter: Payer: Self-pay | Admitting: Physical Therapy

## 2015-03-21 DIAGNOSIS — R2689 Other abnormalities of gait and mobility: Secondary | ICD-10-CM

## 2015-03-21 DIAGNOSIS — R269 Unspecified abnormalities of gait and mobility: Secondary | ICD-10-CM | POA: Diagnosis not present

## 2015-03-21 DIAGNOSIS — R6889 Other general symptoms and signs: Secondary | ICD-10-CM

## 2015-03-21 DIAGNOSIS — Z7409 Other reduced mobility: Secondary | ICD-10-CM

## 2015-03-21 DIAGNOSIS — Z4789 Encounter for other orthopedic aftercare: Secondary | ICD-10-CM

## 2015-03-21 DIAGNOSIS — R531 Weakness: Secondary | ICD-10-CM

## 2015-03-21 NOTE — Therapy (Signed)
[facility] [address] [place], [place], [address] Phone: [phone]   Fax:  [phone]  Physical Therapy Treatment  Patient Details  Name: [name] MRN: [id number] Date of Birth: 1934-10-31 Referring Provider: Alger Simons, MD  Encounter Date: 03/21/2015      PT End of Session - 03/21/15 1105    Visit Number 10   Number of Visits 18   Date for PT Re-Evaluation 03/31/15   Authorization Type Medicare G-code & progress report   PT Start Time 1103   PT Stop Time 1145   PT Time Calculation (min) 42 min   Equipment Utilized During Treatment Gait belt   Activity Tolerance Patient tolerated treatment well   Behavior During Therapy [facility] for tasks assessed/performed      Past Medical History  Diagnosis Date  . Gout   . Benign prostatic hypertrophy     takes Proscar daily  . Atrial fibrillation ([place])     takes Warfarin daily  . Chronic systolic dysfunction of left ventricle     a. mixed ischemic and nonischemic CM,  EF 35%. b. s/p AICD implantation.  . ED (erectile dysfunction)   . Arthritis   . Pacemaker     medtronic  . CAD (coronary artery disease)     a. s/p mid LAD stenting with DES 2008 Dr. Daneen Schick  . ICD (implantable cardiac defibrillator) in place     medtronic, Dr. Rayann Heman  . ICD (implantable cardiac defibrillator) in place   . Sleep apnea     hx of "had surgery for"  . Automatic implantable cardioverter-defibrillator in situ   . Depression   . Numbness and tingling     Hx; 47fleft foot  . PAD (peripheral artery disease) (HCC)     Severe by PV angiogram 09/2011  . Renal artery stenosis ([facility]     s/p stenting 2009  . PAD (peripheral artery disease) (HCC)     s/p multiple LLE bypass grafts; left mid-distal SCA occlusion by 08/2012 duplex  . Hypertension     takes Carvedilol and Losartan daily  . Constipation     takes Miralax daily as needed and Colace daily   . Type II diabetes  mellitus (HCC)     takes Novolog 70/30  . CHF (congestive heart failure) (HCC)     takes Lasix daily  . History of MRSA infection   . Insomnia     TAKES TRAZODONE NIGHTLY    Past Surgical History  Procedure Laterality Date  . Turp vaporization    . Cardiac defibrillator placement  12/26/09    pacemaker combo  . Cervical epidural injection  2013  . Femoral-tibial bypass graft  09/25/2011    Procedure: BYPASS GRAFT FEMORAL-TIBIAL ARTERY;  Surgeon: JMal Misty MD;  [facility]  Service: Vascular;  Laterality: Left;  Left Femoral - Anterior Tibial Bypass;  saphenous vein graft from left leg  . Intraoperative arteriogram  09/25/2011    Procedure: INTRA OPERATIVE ARTERIOGRAM;  Surgeon: JMal Misty MD;  [place] [facility]  Service: Vascular;  Laterality: Left;  . Femoral-tibial bypass graft  02/07/2012    Procedure: BYPASS GRAFT FEMORAL-TIBIAL ARTERY;  Surgeon: TRosetta Posner MD;  [facility]  Service: Vascular;  Laterality: Left;  Thrombectomy Left Femoral - Tibial Bypass Graft  . Embolectomy  02/07/2012    Procedure: EMBOLECTOMY;  Surgeon: TRosetta Posner MD;  [place] [facility]  Service: Vascular;  Laterality: Left;  .  Femoral-tibial bypass graft  04/03/2012    Procedure: BYPASS GRAFT FEMORAL-TIBIAL ARTERY;  Surgeon: Mal Misty, MD;  Location[place];  Service: Vascular;  Laterality: Left;  Redo  . Insert / replace / remove pacemaker  2007  . Coronary angioplasty with stent placement  ~ 2000  . Coronary angioplasty    . Uvulopalatopharyngoplasty (uppp)/tonsillectomy/septoplasty  06/30/2003    Archie Endo 06/30/2003 (07/10/2012)  . Shoulder open rotator cuff repair Right 2001    repair of lacerated right/notes 10/11/1999  (07/10/2012)  . Foot surgery Right 03/20/2001    "have plates and screws in; didn't break it" (07/10/2012)  . Carpal tunnel release Right 2002    Archie Endo 03/20/2001 (07/10/2012)  . Biceps tendon repair Right 2001    Archie Endo 03/20/2001 (07/10/2012)  . Renal artery stent  2009  .  Femoral-popliteal bypass graft Left 09/16/2012    Procedure: LEFT FEMORAL-POPLITEAL BYPASS GRAFT WITH GORTEX Propaten Graft 6x80 Thin Wall and Left lower leg Angiogram;  Surgeon: Mal Misty, MD;  Location: [place];  Service: Vascular;  Laterality: Left;  . Colonoscopy      Hx; of  . Tonsillectomy    . Adenoidectomy      Hx; of   . Femoral-tibial bypass graft Left 09/30/2012    Procedure: REDO LEFT FEMORAL-ANTERIOR TIBIAL ARTERY BYPASS USING COMPOSITE CEPHALIC AND BASILIC VEIN GRAFT FROM LEFT ARM;  Surgeon: Mal Misty, MD;  Location: [place];  Service: Vascular;  Laterality: Left;  . I&d extremity Left 10/21/2012    Procedure: EXPLORATION AND DEBRIDEMENT OF LEFT GROIN WOUND;  Surgeon: Mal Misty, MD;  Location: [place];  Service: Vascular;  Laterality: Left;  . Patch angioplasty Left 10/21/2012    Procedure: PATCH ANGIOPLASTY;  Surgeon: Mal Misty, MD;  Location: [place];  Service: Vascular;  Laterality: Left;  . Groin debridement Left 11/12/2012    Procedure: CLOSURE INGUINAL WOUND;  Surgeon: Mal Misty, MD;  Location: [place];  Service: Vascular;  Laterality: Left;  . Embolectomy Left 12/16/2012    Procedure: THROMBECTOMY  LEFT LEG BYPASS;  Surgeon: Elam Dutch, MD;  Location: [place];  Service: Vascular;  Laterality: Left;  . Leg amputation above knee Left 02/04/2013    DR LAWSON  . Amputation Left 02/04/2013    Procedure: AMPUTATION ABOVE KNEE-LEFT;  Surgeon: Mal Misty, MD;  Location: [place];  Service: Vascular;  Laterality: Left;  . Removal of graft Left 04/16/2013    Procedure: I & D LEFT AKA WOUND, POSSIBLE REMOVAL OF INFECTED GORTEX GRAFT;  Surgeon: Mal Misty, MD;  Location: [place];  Service: Vascular;  Laterality: Left;  . Abdominal aortagram N/A 09/11/2011    Procedure: ABDOMINAL Maxcine Ham;  Surgeon: Wellington Hampshire, MD;  Location[facility] CATH LAB;  Service: Cardiovascular;  Laterality: N/A;  . Lower extremity angiogram Left 12/25/2011    Procedure: LOWER EXTREMITY  ANGIOGRAM;  Surgeon: Serafina Mitchell, MD;  Location: [facility] CATH LAB;  Service: Cardiovascular;  Laterality: Left;  lt leg angio co2  . Abdominal angiogram  12/25/2011    Procedure: ABDOMINAL ANGIOGRAM;  Surgeon: Serafina Mitchell, MD;  Location: [facility] CATH LAB;  Service: Cardiovascular;;  . Lower extremity angiogram N/A 07/07/2012    Procedure: LOWER EXTREMITY ANGIOGRAM;  Surgeon: [place] Montmorenci, MD;  Location: [facility] CATH LAB;  Service: Cardiovascular;  Laterality: N/A;  . Lower extremity angiogram N/A 09/15/2012    Procedure: LOWER EXTREMITY ANGIOGRAM;  Surgeon: Serafina Mitchell, MD;  Location: [facility] CATH LAB;  Service: Cardiovascular;  Laterality: N/A;  . Upper extremity angiogram  09/15/2012    Procedure: UPPER EXTREMITY ANGIOGRAM;  Surgeon: Serafina Mitchell, MD;  Location: [facility] CATH LAB;  Service: Cardiovascular;;  . Biv icd genertaor change out N/A 11/09/2013    Procedure: BIV ICD GENERTAOR CHANGE OUT;  Surgeon: Coralyn Mark, MD;  Location: [facility] CATH LAB;  Service: Cardiovascular;  Laterality: N/A;    There were no vitals filed for this visit.  Visit Diagnosis:  Abnormality of gait  Balance problems  Impaired functional mobility and activity tolerance  Weakness generalized  Activity intolerance  Encounter for prosthetic gait training      Subjective Assessment - 03/21/15 1104    Subjective No new complaints. The phantom pain comes and goes, none right now. Was really bad over the weekend.   Limitations Walking   Patient Stated Goals He wants to walk with prosthesis.    Currently in Pain? No/denies   Pain Score 0-No pain           [facility] Adult PT Treatment/Exercise - 03/21/15 1106    Transfers   Sit to Stand 5: Supervision;With upper extremity assist;With armrests;From chair/3-in-1   Sit to Stand Details Verbal cues for sequencing;Verbal cues for technique   Stand to Sit 5: Supervision;With upper extremity assist;With armrests;To chair/3-in-1   Stand to Sit Details (indicate cue type and  reason) Verbal cues for sequencing;Verbal cues for technique   Ambulation/Gait   Ambulation/Gait Yes   Ambulation/Gait Assistance 5: Supervision   Ambulation/Gait Assistance Details cues on posture and to increase stride length with walker. cues on posture, increased prosthetic weight bearing in stance with cane use.                                  Ambulation Distance (Feet) 220 Feet  x1, 60 ft x2 walker; 40 x1,20 x1 with cane   Assistive device Prosthesis;Rolling walker;Small based quad cane   Gait Pattern Step-through pattern;Step-to pattern;Decreased stance time - left;Decreased step length - right;Decreased hip/knee flexion - left;Left circumduction;Left hip hike;Trunk flexed   Ambulation Surface Level;Indoor   Stairs Yes   Stairs Assistance 5: Supervision   Stairs Assistance Details (indicate cue type and reason) cues on posture, weight shifting and hand advancement on rails   Stair Management Technique Two rails;Step to pattern;Forwards   Number of Stairs 4   Ramp 5: Supervision  walker/prosthesis   Ramp Details (indicate cue type and reason) cues on posture and stride length   Curb 5: Supervision  walker/prosthesis   Curb Details (indicate cue type and reason) cues on posture and foot position with walker advancement.   Prosthetics   Current prosthetic wear tolerance (days/week)  daily   Current prosthetic wear tolerance (#hours/day)  most awake hours, drying as needed (~8-9 hours a day)   Residual limb condition  intact   Education Provided Residual limb care;Proper weight-bearing schedule/adjustment;Correct ply sock adjustment   Person(s) Educated Patient   Education Method Explanation;Demonstration   Education Method Verbalized understanding;Verbal cues required;Needs further instruction   Donning Prosthesis Supervision   Doffing Prosthesis Modified independent (device/increased time)            PT Short Term Goals - 03/03/15 1108    PT SHORT TERM GOAL #1   Title  donnes prosthesis with new suspension correctly independently. (Target Date: 03/03/2015)   Baseline MET 03/02/2015   Status Achieved   PT SHORT TERM GOAL #  2   Title demonstrates understanding of initial HEP  (Target Date: 03/03/2015)   Baseline MET 03/02/2015   Status Achieved   PT SHORT TERM GOAL #3   Title wears prosthesis >70% of awake hours >/= 6 days /wk.  (Target Date: 03/03/2015)   Baseline 03/03/15: wearing most awake hours (8-9 a day)   Status Achieved   PT SHORT TERM GOAL #4   Title ambulates 100' with rolling walker & prosthesis with cues only for deviations.  (Target Date: 03/03/2015)   Baseline MET 03/02/2015 He ambulates 140' with RW & prosthesis with cues only for deviations.    Status Achieved   PT SHORT TERM GOAL #5   Title stands 60sec without UE support with supervision.  (Target Date: 03/03/2015)   Baseline 03/03/15: pt able to stand for ~30 seconds before needed to suppport himself   Status Not Met   PT SHORT TERM GOAL #6   Title pt ambulates 5' with a cane along counter or table for contact with LUE with supervision.  (Target Date: 03/03/2015)   Baseline MET 03/02/2015   Status Achieved   PT SHORT TERM GOAL #7   Title pt negotiates ramps and curb with RW & prosthesis with supervision.  (Target Date: 03/03/2015)   Baseline met on 03/03/15   Time --   Period --   Status Achieved   PT SHORT TERM GOAL #8   Title pt able to transfer safely from [facility] to chair with armrests or car without RW with supervision.  (Target Date: 03/03/2015)   Baseline MET 03/02/2015   Status Achieved           PT Long Term Goals - 03/07/15 1015    PT LONG TERM GOAL #1   Title wears prosthesis >80% of awake hours daily.  (Target Date: 03/31/2015)   Time 2   Period Months   Status On-going   PT LONG TERM GOAL #2   Title verbalize understanding of ongoing fitness plan.  (Target Date: 03/31/2015)   Time 2   Period Months   Status On-going   PT LONG TERM GOAL #3   Title ambulates  250' with rolling walker & prosthesis modified independent.  (Target Date: 03/31/2015)   Time 2   Period Months   Status On-going   PT LONG TERM GOAL #4   Title Berg Balance >/= 20/56  (Target Date: 03/31/2015)   Time 2   Period Months   Status On-going   PT LONG TERM GOAL #5   Title negotiate ramp, curb & stairs (1 rail) with rolling walker & prosthesis modified independent.  (Target Date: 03/31/2015)   Time 2   Period Months   Status On-going   PT LONG TERM GOAL #6   Title ambulates 79' with [facility] & prosthesis modified independent.  (Target Date: 03/31/2015)   Time 2   Period Months   Status On-going           Plan - 03/21/15 1105    Clinical Impression Statement Pt making steady progress toward LTGs. Decreased left UE support needed wtih [facility] use today.   Pt will benefit from skilled therapeutic intervention in order to improve on the following deficits Abnormal gait;Decreased activity tolerance;Decreased balance;Decreased endurance;Decreased knowledge of use of DME;Decreased mobility;Decreased safety awareness;Decreased range of motion;Decreased strength;Impaired flexibility;Postural dysfunction;Prosthetic Dependency   Rehab Potential Good   PT Frequency 2x / week   PT Duration Other (comment)  9 weeks (60 days)   PT Treatment/Interventions ADLs/Self Care Home Management;DME  Instruction;Gait training;Stair training;Functional mobility training;Therapeutic activities;Therapeutic exercise;Balance training;Neuromuscular re-education;Patient/family education;Prosthetic Training   PT Next Visit Plan work towards [employer] and Agree with Plan of Care Patient          G-Codes - 11-Apr-2015 1155    Functional Assessment Tool Used wearing prosthesis all awake hours (9-10) and drying as needed. occasional cues to tighten strap and/or on sock management when prosthesis rotates during movement      Problem List Patient Active Problem List   Diagnosis Date Noted  . Phantom limb  syndrome with pain ([place]) 08/03/2014  . Diabetic polyneuropathy associated with diabetes mellitus due to underlying condition ([place]) 07/12/2014  . CVA (cerebral infarction) 06/15/2014  . Expressive aphasia 06/15/2014  . CKD (chronic kidney disease) stage 4, GFR 15-29 ml/min (HCC) 06/15/2014  . DM (diabetes mellitus), type 2, uncontrolled, with renal complications ([place]) 81/12/3157  . Unilateral AKA ([place]) 02/09/2013  . S/P ICD (internal cardiac defibrillator) procedure 05/18/2012  . PAD (peripheral artery disease) ([place]) 09/04/2011  . CAD (coronary artery disease)   . Chronic systolic congestive heart failure ([place]) 07/06/2010  . Gout 09/18/2007  . Essential hypertension 01/23/2007  . Atrial fibrillation ([place]) 01/23/2007  . BPH (benign prostatic hyperplasia) 01/23/2007    Willow Ora 2015-04-11, 11:58 AM  Willow Ora, PTA, [facility] [address] [place], [place] [address] [phone] Apr 11, 2015, 11:58 AM   Name: [name] MRN: [id number] Date of Birth: Jul 14, 1934

## 2015-03-22 ENCOUNTER — Encounter: Payer: Self-pay | Admitting: Physical Therapy

## 2015-03-22 NOTE — Therapy (Signed)
[facility] [address] [place], [place], [address] Phone: [phone]   Fax:  [phone]  Patient Details  Name: [name] MRN: [id number] Date of Birth: 08/28/1934 Referring Provider:  [name], MD  Encounter Date: 03/22/2015  Physical Therapy Progress Note  Dates of Reporting Period: 01/31/2015 to 03/21/2015  Objective Reports of Subjective Statement: Patient reports wearing prosthesis and using it in home & community with rolling walker. He reports family & friends have mentioned improved mobility.   Objective Measurements: Patient wears prosthesis >90% of all awake hours without pain or issues. Needs cues to remember to full tighten suspension strap to decrease pistoning.   Goal Update:      PT Short Term Goals - 03/03/15 1108    PT SHORT TERM GOAL #1   Title donnes prosthesis with new suspension correctly independently. (Target Date: 03/03/2015)   Baseline MET 03/02/2015   Status Achieved   PT SHORT TERM GOAL #2   Title demonstrates understanding of initial HEP  (Target Date: 03/03/2015)   Baseline MET 03/02/2015   Status Achieved   PT SHORT TERM GOAL #3   Title wears prosthesis >70% of awake hours >/= 6 days /wk.  (Target Date: 03/03/2015)   Baseline 03/03/15: wearing most awake hours (8-9 a day)   Status Achieved   PT SHORT TERM GOAL #4   Title ambulates 100' with rolling walker & prosthesis with cues only for deviations.  (Target Date: 03/03/2015)   Baseline MET 03/02/2015 He ambulates 140' with RW & prosthesis with cues only for deviations.    Status Achieved   PT SHORT TERM GOAL #5   Title stands 60sec without UE support with supervision.  (Target Date: 03/03/2015)   Baseline 03/03/15: pt able to stand for ~30 seconds before needed to suppport himself   Status Not Met   PT SHORT TERM GOAL #6   Title pt ambulates 22' with a cane along counter or table for contact with LUE with supervision.   (Target Date: 03/03/2015)   Baseline MET 03/02/2015   Status Achieved   PT SHORT TERM GOAL #7   Title pt negotiates ramps and curb with RW & prosthesis with supervision.  (Target Date: 03/03/2015)   Baseline met on 03/03/15   Time --   Period --   Status Achieved   PT SHORT TERM GOAL #8   Title pt able to transfer safely from [facility] to chair with armrests or car without RW with supervision.  (Target Date: 03/03/2015)   Baseline MET 03/02/2015   Status Achieved         PT Long Term Goals - 03/07/15 1015    PT LONG TERM GOAL #1   Title wears prosthesis >80% of awake hours daily.  (Target Date: 03/31/2015)   Time 2   Period Months   Status On-going   PT LONG TERM GOAL #2   Title verbalize understanding of ongoing fitness plan.  (Target Date: 03/31/2015)   Time 2   Period Months   Status On-going   PT LONG TERM GOAL #3   Title ambulates 250' with rolling walker & prosthesis modified independent.  (Target Date: 03/31/2015)   Time 2   Period Months   Status On-going   PT LONG TERM GOAL #4   Title Berg Balance >/= 20/56  (Target Date: 03/31/2015)   Time 2   Period Months   Status On-going   PT LONG TERM GOAL #5   Title negotiate ramp, curb &  stairs (1 rail) with rolling walker & prosthesis modified independent.  (Target Date: 03/31/2015)   Time 2   Period Months   Status On-going   PT LONG TERM GOAL #6   Title ambulates 69' with [facility] & prosthesis modified independent.  (Target Date: 03/31/2015)   Time 2   Period Months   Status On-going      Plan: continue with initial plan of care  Reason Skilled Services are Required: Patient requires instruction to maximize function with Transfemoral prosthesis at community level.    Praise Dolecki PT, DPT 03/22/2015, 9:14 AM  [facility] [address] [place], [place], [address] Phone: [phone]   Fax:  [phone]

## 2015-03-23 ENCOUNTER — Encounter: Payer: Self-pay | Admitting: Registered Nurse

## 2015-03-23 ENCOUNTER — Ambulatory Visit: Payer: PPO | Admitting: Physical Therapy

## 2015-03-23 ENCOUNTER — Encounter: Payer: PPO | Attending: Physical Medicine & Rehabilitation | Admitting: Registered Nurse

## 2015-03-23 ENCOUNTER — Encounter: Payer: Self-pay | Admitting: Physical Therapy

## 2015-03-23 VITALS — BP 157/77 | HR 67

## 2015-03-23 DIAGNOSIS — G546 Phantom limb syndrome with pain: Secondary | ICD-10-CM | POA: Diagnosis not present

## 2015-03-23 DIAGNOSIS — R2689 Other abnormalities of gait and mobility: Secondary | ICD-10-CM

## 2015-03-23 DIAGNOSIS — R269 Unspecified abnormalities of gait and mobility: Secondary | ICD-10-CM

## 2015-03-23 DIAGNOSIS — R531 Weakness: Secondary | ICD-10-CM

## 2015-03-23 DIAGNOSIS — Z5181 Encounter for therapeutic drug level monitoring: Secondary | ICD-10-CM | POA: Diagnosis not present

## 2015-03-23 DIAGNOSIS — Z89612 Acquired absence of left leg above knee: Secondary | ICD-10-CM

## 2015-03-23 DIAGNOSIS — Z79899 Other long term (current) drug therapy: Secondary | ICD-10-CM | POA: Diagnosis not present

## 2015-03-23 DIAGNOSIS — M79672 Pain in left foot: Secondary | ICD-10-CM | POA: Insufficient documentation

## 2015-03-23 DIAGNOSIS — Z4789 Encounter for other orthopedic aftercare: Secondary | ICD-10-CM

## 2015-03-23 DIAGNOSIS — R6889 Other general symptoms and signs: Secondary | ICD-10-CM

## 2015-03-23 DIAGNOSIS — Z7409 Other reduced mobility: Secondary | ICD-10-CM

## 2015-03-23 DIAGNOSIS — G894 Chronic pain syndrome: Secondary | ICD-10-CM

## 2015-03-23 DIAGNOSIS — S78112A Complete traumatic amputation at level between left hip and knee, initial encounter: Secondary | ICD-10-CM

## 2015-03-23 MED ORDER — OXYCODONE-ACETAMINOPHEN 10-325 MG PO TABS: 1.0000 | ORAL_TABLET | ORAL | Status: DC | PRN

## 2015-03-23 NOTE — Patient Instructions (Signed)
Increase Gabapentin to four times a day. Call office in a week to evaluate

## 2015-03-23 NOTE — Therapy (Signed)
[place] [address] [place], [place], [address] Phone: [phone]   Fax:  [phone]  Physical Therapy Treatment  Patient Details  Name: [name] MRN: [id number] Date of Birth: 05-05-1934 Referring Provider: Alger Simons, MD  Encounter Date: 03/23/2015      PT End of Session - 03/23/15 0930    Visit Number 11   Number of Visits 18   Date for PT Re-Evaluation 03/31/15   Authorization Type Medicare G-code & progress report   PT Start Time 0845   PT Stop Time 0930   PT Time Calculation (min) 45 min   Equipment Utilized During Treatment Gait belt   Activity Tolerance Patient tolerated treatment well   Behavior During Therapy [facility] for tasks assessed/performed      Past Medical History  Diagnosis Date  . Gout   . Benign prostatic hypertrophy     takes Proscar daily  . Atrial fibrillation ([place])     takes Warfarin daily  . Chronic systolic dysfunction of left ventricle     a. mixed ischemic and nonischemic CM,  EF 35%. b. s/p AICD implantation.  . ED (erectile dysfunction)   . Arthritis   . Pacemaker     medtronic  . CAD (coronary artery disease)     a. s/p mid LAD stenting with DES 2008 Dr. Daneen Schick  . ICD (implantable cardiac defibrillator) in place     medtronic, Dr. Rayann Heman  . ICD (implantable cardiac defibrillator) in place   . Sleep apnea     hx of "had surgery for"  . Automatic implantable cardioverter-defibrillator in situ   . Depression   . Numbness and tingling     Hx; 32fleft foot  . PAD (peripheral artery disease) (HCC)     Severe by PV angiogram 09/2011  . Renal artery stenosis ([place]     s/p stenting 2009  . PAD (peripheral artery disease) (HCC)     s/p multiple LLE bypass grafts; left mid-distal SCA occlusion by 08/2012 duplex  . Hypertension     takes Carvedilol and Losartan daily  . Constipation     takes Miralax daily as needed and Colace daily   . Type II diabetes  mellitus (HCC)     takes Novolog 70/30  . CHF (congestive heart failure) (HCC)     takes Lasix daily  . History of MRSA infection   . Insomnia     TAKES TRAZODONE NIGHTLY    Past Surgical History  Procedure Laterality Date  . Turp vaporization    . Cardiac defibrillator placement  12/26/09    pacemaker combo  . Cervical epidural injection  2013  . Femoral-tibial bypass graft  09/25/2011    Procedure: BYPASS GRAFT FEMORAL-TIBIAL ARTERY;  Surgeon: JMal Misty MD;  [facility]  Service: Vascular;  Laterality: Left;  Left Femoral - Anterior Tibial Bypass;  saphenous vein graft from left leg  . Intraoperative arteriogram  09/25/2011    Procedure: INTRA OPERATIVE ARTERIOGRAM;  Surgeon: JMal Misty MD;  [place] [facility]  Service: Vascular;  Laterality: Left;  . Femoral-tibial bypass graft  02/07/2012    Procedure: BYPASS GRAFT FEMORAL-TIBIAL ARTERY;  Surgeon: TRosetta Posner MD;  [place] [facility]  Service: Vascular;  Laterality: Left;  Thrombectomy Left Femoral - Tibial Bypass Graft  . Embolectomy  02/07/2012    Procedure: EMBOLECTOMY;  Surgeon: TRosetta Posner MD;  [place] [facility]  Service: Vascular;  Laterality: Left;  .  Femoral-tibial bypass graft  04/03/2012    Procedure: BYPASS GRAFT FEMORAL-TIBIAL ARTERY;  Surgeon: Mal Misty, MD;  Location: [place];  Service: Vascular;  Laterality: Left;  Redo  . Insert / replace / remove pacemaker  2007  . Coronary angioplasty with stent placement  ~ 2000  . Coronary angioplasty    . Uvulopalatopharyngoplasty (uppp)/tonsillectomy/septoplasty  06/30/2003    Archie Endo 06/30/2003 (07/10/2012)  . Shoulder open rotator cuff repair Right 2001    repair of lacerated right/notes 10/11/1999  (07/10/2012)  . Foot surgery Right 03/20/2001    "have plates and screws in; didn't break it" (07/10/2012)  . Carpal tunnel release Right 2002    Archie Endo 03/20/2001 (07/10/2012)  . Biceps tendon repair Right 2001    Archie Endo 03/20/2001 (07/10/2012)  . Renal artery stent  2009  .  Femoral-popliteal bypass graft Left 09/16/2012    Procedure: LEFT FEMORAL-POPLITEAL BYPASS GRAFT WITH GORTEX Propaten Graft 6x80 Thin Wall and Left lower leg Angiogram;  Surgeon: Mal Misty, MD;  Location: [place];  Service: Vascular;  Laterality: Left;  . Colonoscopy      Hx; of  . Tonsillectomy    . Adenoidectomy      Hx; of   . Femoral-tibial bypass graft Left 09/30/2012    Procedure: REDO LEFT FEMORAL-ANTERIOR TIBIAL ARTERY BYPASS USING COMPOSITE CEPHALIC AND BASILIC VEIN GRAFT FROM LEFT ARM;  Surgeon: Mal Misty, MD;  Location: [place];  Service: Vascular;  Laterality: Left;  . I&d extremity Left 10/21/2012    Procedure: EXPLORATION AND DEBRIDEMENT OF LEFT GROIN WOUND;  Surgeon: Mal Misty, MD;  Location: [place];  Service: Vascular;  Laterality: Left;  . Patch angioplasty Left 10/21/2012    Procedure: PATCH ANGIOPLASTY;  Surgeon: Mal Misty, MD;  Location: [place];  Service: Vascular;  Laterality: Left;  . Groin debridement Left 11/12/2012    Procedure: CLOSURE INGUINAL WOUND;  Surgeon: Mal Misty, MD;  Location: [place];  Service: Vascular;  Laterality: Left;  . Embolectomy Left 12/16/2012    Procedure: THROMBECTOMY  LEFT LEG BYPASS;  Surgeon: Elam Dutch, MD;  Location: [place];  Service: Vascular;  Laterality: Left;  . Leg amputation above knee Left 02/04/2013    DR LAWSON  . Amputation Left 02/04/2013    Procedure: AMPUTATION ABOVE KNEE-LEFT;  Surgeon: Mal Misty, MD;  Location: [place];  Service: Vascular;  Laterality: Left;  . Removal of graft Left 04/16/2013    Procedure: I & D LEFT AKA WOUND, POSSIBLE REMOVAL OF INFECTED GORTEX GRAFT;  Surgeon: Mal Misty, MD;  Location: [place];  Service: Vascular;  Laterality: Left;  . Abdominal aortagram N/A 09/11/2011    Procedure: ABDOMINAL Maxcine Ham;  Surgeon: Wellington Hampshire, MD;  Location[facility] CATH LAB;  Service: Cardiovascular;  Laterality: N/A;  . Lower extremity angiogram Left 12/25/2011    Procedure: LOWER EXTREMITY  ANGIOGRAM;  Surgeon: Serafina Mitchell, MD;  Location: [facility] CATH LAB;  Service: Cardiovascular;  Laterality: Left;  lt leg angio co2  . Abdominal angiogram  12/25/2011    Procedure: ABDOMINAL ANGIOGRAM;  Surgeon: Serafina Mitchell, MD;  Location: [facility] CATH LAB;  Service: Cardiovascular;;  . Lower extremity angiogram N/A 07/07/2012    Procedure: LOWER EXTREMITY ANGIOGRAM;  Surgeon: [place] Montmorenci, MD;  Location: [facility] CATH LAB;  Service: Cardiovascular;  Laterality: N/A;  . Lower extremity angiogram N/A 09/15/2012    Procedure: LOWER EXTREMITY ANGIOGRAM;  Surgeon: Serafina Mitchell, MD;  Location: [facility] CATH LAB;  Service: Cardiovascular;  Laterality: N/A;  . Upper extremity angiogram  09/15/2012    Procedure: UPPER EXTREMITY ANGIOGRAM;  Surgeon: Serafina Mitchell, MD;  Location: [facility] CATH LAB;  Service: Cardiovascular;;  . Biv icd genertaor change out N/A 11/09/2013    Procedure: BIV ICD GENERTAOR CHANGE OUT;  Surgeon: Coralyn Mark, MD;  Location: [facility] CATH LAB;  Service: Cardiovascular;  Laterality: N/A;    There were no vitals filed for this visit.  Visit Diagnosis:  Abnormality of gait  Balance problems  Impaired functional mobility and activity tolerance  Weakness generalized  Activity intolerance  Encounter for prosthetic gait training  Status post above knee amputation of left lower extremity (HCC)      Subjective Assessment - 03/23/15 0848    Subjective He has been using walker to gat around. He is having a lift put on bumper of car for his scooter.    Currently in Pain? No/denies                         [facility] Adult PT Treatment/Exercise - 03/23/15 0845    Transfers   Sit to Stand 5: Supervision;With upper extremity assist;With armrests;From chair/3-in-1  chairs with & without armrests   Sit to Stand Details Verbal cues for sequencing;Verbal cues for technique   Sit to Stand Details (indicate cue type and reason) cues on technique without armrests   Stand to Sit 5:  Supervision;With upper extremity assist;With armrests;To chair/3-in-1  chairs with  & without armrests   Stand to Sit Details (indicate cue type and reason) Verbal cues for sequencing;Verbal cues for technique   Stand to Sit Details cues on technique for chairs without armrests   Ambulation/Gait   Ambulation/Gait Yes   Ambulation/Gait Assistance 5: Supervision   Ambulation/Gait Assistance Details demo & verbal cues on increase prosthesis stance with focus on right LE initial contact soft and focus on flexing prosthesis prior to swing for [place] without compensations/ deviations   Ambulation Distance (Feet) 220 Feet  220' with walking max tolerance (4 min), 80', 40', 100'   Assistive device Prosthesis;Rolling walker;Small based quad cane   Gait Pattern Step-through pattern;Step-to pattern;Decreased stance time - left;Decreased step length - right;Decreased hip/knee flexion - left;Left circumduction;Left hip hike;Trunk flexed   Ambulation Surface Indoor;Level   Stairs Yes   Stairs Assistance 5: Supervision   Stair Management Technique Two rails;Step to pattern;Forwards   Number of Stairs 4   Ramp 5: Supervision  walker/prosthesis   Ramp Details (indicate cue type and reason) cues on technique   Curb 5: Supervision  walker/prosthesis   Curb Details (indicate cue type and reason) cues on technique   Neuro Re-ed    Neuro Re-ed Details  Balance activities in parallel bars for security but without UE support using mirror for visual feedback & PT cues on equal WB  position & upright trunk: head movements with eyes open (side to side & up/down), varied arm position / movements, single leg stance on RLE 1-2 seconds, attempting to step prosthesis forward & lateral 2-4" but right hip pain so stopped.    Prosthetics   Current prosthetic wear tolerance (days/week)  daily   Current prosthetic wear tolerance (#hours/day)  most awake hours, drying as needed (~8-9 hours a day)   Residual limb condition   intact   Education Provided Residual limb care;Proper weight-bearing schedule/adjustment;Correct ply sock adjustment                PT  Education - 03/23/15 0930    Education provided Yes   Education Details PT instructed to not overuse scooter which would lead to further deconditioning with new lift on his car. PT recommended asking himself : "Can I get in &out of this place with the walker?" if yes use walker not scooter.    Person(s) Educated Patient   Methods Explanation   Comprehension Verbalized understanding          PT Short Term Goals - 03/03/15 1108    PT SHORT TERM GOAL #1   Title donnes prosthesis with new suspension correctly independently. (Target Date: 03/03/2015)   Baseline MET 03/02/2015   Status Achieved   PT SHORT TERM GOAL #2   Title demonstrates understanding of initial HEP  (Target Date: 03/03/2015)   Baseline MET 03/02/2015   Status Achieved   PT SHORT TERM GOAL #3   Title wears prosthesis >70% of awake hours >/= 6 days /wk.  (Target Date: 03/03/2015)   Baseline 03/03/15: wearing most awake hours (8-9 a day)   Status Achieved   PT SHORT TERM GOAL #4   Title ambulates 100' with rolling walker & prosthesis with cues only for deviations.  (Target Date: 03/03/2015)   Baseline MET 03/02/2015 He ambulates 140' with RW & prosthesis with cues only for deviations.    Status Achieved   PT SHORT TERM GOAL #5   Title stands 60sec without UE support with supervision.  (Target Date: 03/03/2015)   Baseline 03/03/15: pt able to stand for ~30 seconds before needed to suppport himself   Status Not Met   PT SHORT TERM GOAL #6   Title pt ambulates 77' with a cane along counter or table for contact with LUE with supervision.  (Target Date: 03/03/2015)   Baseline MET 03/02/2015   Status Achieved   PT SHORT TERM GOAL #7   Title pt negotiates ramps and curb with RW & prosthesis with supervision.  (Target Date: 03/03/2015)   Baseline met on 03/03/15   Time --    Period --   Status Achieved   PT SHORT TERM GOAL #8   Title pt able to transfer safely from [facility] to chair with armrests or car without RW with supervision.  (Target Date: 03/03/2015)   Baseline MET 03/02/2015   Status Achieved           PT Long Term Goals - 03/07/15 1015    PT LONG TERM GOAL #1   Title wears prosthesis >80% of awake hours daily.  (Target Date: 03/31/2015)   Time 2   Period Months   Status On-going   PT LONG TERM GOAL #2   Title verbalize understanding of ongoing fitness plan.  (Target Date: 03/31/2015)   Time 2   Period Months   Status On-going   PT LONG TERM GOAL #3   Title ambulates 250' with rolling walker & prosthesis modified independent.  (Target Date: 03/31/2015)   Time 2   Period Months   Status On-going   PT LONG TERM GOAL #4   Title Berg Balance >/= 20/56  (Target Date: 03/31/2015)   Time 2   Period Months   Status On-going   PT LONG TERM GOAL #5   Title negotiate ramp, curb & stairs (1 rail) with rolling walker & prosthesis modified independent.  (Target Date: 03/31/2015)   Time 2   Period Months   Status On-going   PT LONG TERM GOAL #6   Title ambulates 75' with [facility] & prosthesis modified  independent.  (Target Date: 03/31/2015)   Time 2   Period Months   Status On-going               Plan - 03/23/15 0930    Clinical Impression Statement PT plans discharge next week. Patient needs reminders to unlock his prosthesis to decrease energy expenditure. He was able to ambulate for 4 minutes (220') and PT recommends using this time initially and slowly progressing to build endurance & moblity.    Pt will benefit from skilled therapeutic intervention in order to improve on the following deficits Abnormal gait;Decreased activity tolerance;Decreased balance;Decreased endurance;Decreased knowledge of use of DME;Decreased mobility;Decreased safety awareness;Decreased range of motion;Decreased strength;Impaired flexibility;Postural dysfunction;Prosthetic  Dependency   Rehab Potential Good   PT Frequency 2x / week   PT Duration Other (comment)  9 weeks (60 days)   PT Treatment/Interventions ADLs/Self Care Home Management;DME Instruction;Gait training;Stair training;Functional mobility training;Therapeutic activities;Therapeutic exercise;Balance training;Neuromuscular re-education;Patient/family education;Prosthetic Training   PT Next Visit Plan Begin to assess LTGs for discharge next week. work towards [employer] and Agree with Plan of Care Patient        Problem List Patient Active Problem List   Diagnosis Date Noted  . Phantom limb syndrome with pain ([place]) 08/03/2014  . Diabetic polyneuropathy associated with diabetes mellitus due to underlying condition ([place]) 07/12/2014  . CVA (cerebral infarction) 06/15/2014  . Expressive aphasia 06/15/2014  . CKD (chronic kidney disease) stage 4, GFR 15-29 ml/min (HCC) 06/15/2014  . DM (diabetes mellitus), type 2, uncontrolled, with renal complications ([place]) 16/12/9602  . Unilateral AKA ([place]) 02/09/2013  . S/P ICD (internal cardiac defibrillator) procedure 05/18/2012  . PAD (peripheral artery disease) ([place]) 09/04/2011  . CAD (coronary artery disease)   . Chronic systolic congestive heart failure ([place]) 07/06/2010  . Gout 09/18/2007  . Essential hypertension 01/23/2007  . Atrial fibrillation ([place]) 01/23/2007  . BPH (benign prostatic hyperplasia) 01/23/2007    Jamey Reas PT, DPT 03/23/2015, 1:00 PM  [place] [address] [place], [place], [address] Phone: [phone]   Fax:  [phone]  Name: [name] MRN: [id number] Date of Birth: 11-25-1934

## 2015-03-23 NOTE — Progress Notes (Signed)
Subjective:    Patient ID: [name], male    DOB: 1934-09-02, 81 y.o.   MRN: [id number]  HPI: [name] is a 81 year old male who returns for follow up for chronic pain and medication refill. He's complaining of phantom pain. He rates his pain 5. His current exercise regime is attending physical therapy twice a week and the [employer] twice a week. Also asked about Viagra he will follow up with his PCP.  Pain Inventory Average Pain 7 Pain Right Now 5 My pain is sharp and tingling  In the last 24 hours, has pain interfered with the following? General activity 3 Relation with others 5 Enjoyment of life 4 What TIME of day is your pain at its worst? evening, night Sleep (in general) Fair  Pain is worse with: inactivity Pain improves with: medication Relief from Meds: 8  Mobility use a cane use a walker how many minutes can you walk? 2 ability to climb steps?  yes do you drive?  yes use a wheelchair Do you have any goals in this area?  yes  Function retired  Neuro/Psych trouble walking  Prior Studies Any changes since last visit?  no  Physicians involved in your care Any changes since last visit?  no   Family History  Problem Relation Age of Onset  . Cancer      breast/fhx  . Heart disease      fhx  . Diabetes Neg Hx   . Cancer Father    Social History   Social History  . Marital Status: Widowed    Spouse Name: N/A  . Number of Children: N/A  . Years of Education: N/A   Occupational History  . Retired    Social History Main Topics  . Smoking status: Current Some Day Smoker    Types: Cigars  . Smokeless tobacco: Never Used     Comment: 1 cigar each month  . Alcohol Use: 0.0 oz/week    0 Standard drinks or equivalent per week     Comment: once a week wine  . Drug Use: No  . Sexual Activity: Not Currently   Other Topics Concern  . None   Social History Narrative   Lives in [place] with significant other,  [name].   Past  Surgical History  Procedure Laterality Date  . Turp vaporization    . Cardiac defibrillator placement  12/26/09    pacemaker combo  . Cervical epidural injection  2013  . Femoral-tibial bypass graft  09/25/2011    Procedure: BYPASS GRAFT FEMORAL-TIBIAL ARTERY;  Surgeon: Mal Misty, MD;  Location: [facility] OR;  Service: Vascular;  Laterality: Left;  Left Femoral - Anterior Tibial Bypass;  saphenous vein graft from left leg  . Intraoperative arteriogram  09/25/2011    Procedure: INTRA OPERATIVE ARTERIOGRAM;  Surgeon: Mal Misty, MD;  Location: [place];  Service: Vascular;  Laterality: Left;  . Femoral-tibial bypass graft  02/07/2012    Procedure: BYPASS GRAFT FEMORAL-TIBIAL ARTERY;  Surgeon: Rosetta Posner, MD;  Location: [place];  Service: Vascular;  Laterality: Left;  Thrombectomy Left Femoral - Tibial Bypass Graft  . Embolectomy  02/07/2012    Procedure: EMBOLECTOMY;  Surgeon: Rosetta Posner, MD;  Location: [place];  Service: Vascular;  Laterality: Left;  . Femoral-tibial bypass graft  04/03/2012    Procedure: BYPASS GRAFT FEMORAL-TIBIAL ARTERY;  Surgeon: Mal Misty, MD;  Location: [place];  Service: Vascular;  Laterality: Left;  Redo  .  Insert / replace / remove pacemaker  2007  . Coronary angioplasty with stent placement  ~ 2000  . Coronary angioplasty    . Uvulopalatopharyngoplasty (uppp)/tonsillectomy/septoplasty  06/30/2003    Archie Endo 06/30/2003 (07/10/2012)  . Shoulder open rotator cuff repair Right 2001    repair of lacerated right/notes 10/11/1999  (07/10/2012)  . Foot surgery Right 03/20/2001    "have plates and screws in; didn't break it" (07/10/2012)  . Carpal tunnel release Right 2002    Archie Endo 03/20/2001 (07/10/2012)  . Biceps tendon repair Right 2001    Archie Endo 03/20/2001 (07/10/2012)  . Renal artery stent  2009  . Femoral-popliteal bypass graft Left 09/16/2012    Procedure: LEFT FEMORAL-POPLITEAL BYPASS GRAFT WITH GORTEX Propaten Graft 6x80 Thin Wall and Left lower leg Angiogram;  Surgeon: Mal Misty, MD;  Location: [place];  Service: Vascular;  Laterality: Left;  . Colonoscopy      Hx; of  . Tonsillectomy    . Adenoidectomy      Hx; of   . Femoral-tibial bypass graft Left 09/30/2012    Procedure: REDO LEFT FEMORAL-ANTERIOR TIBIAL ARTERY BYPASS USING COMPOSITE CEPHALIC AND BASILIC VEIN GRAFT FROM LEFT ARM;  Surgeon: Mal Misty, MD;  Location: [place];  Service: Vascular;  Laterality: Left;  . I&d extremity Left 10/21/2012    Procedure: EXPLORATION AND DEBRIDEMENT OF LEFT GROIN WOUND;  Surgeon: Mal Misty, MD;  Location: [place];  Service: Vascular;  Laterality: Left;  . Patch angioplasty Left 10/21/2012    Procedure: PATCH ANGIOPLASTY;  Surgeon: Mal Misty, MD;  Location: [place];  Service: Vascular;  Laterality: Left;  . Groin debridement Left 11/12/2012    Procedure: CLOSURE INGUINAL WOUND;  Surgeon: Mal Misty, MD;  Location: [place];  Service: Vascular;  Laterality: Left;  . Embolectomy Left 12/16/2012    Procedure: THROMBECTOMY  LEFT LEG BYPASS;  Surgeon: Elam Dutch, MD;  Location: [place];  Service: Vascular;  Laterality: Left;  . Leg amputation above knee Left 02/04/2013    DR LAWSON  . Amputation Left 02/04/2013    Procedure: AMPUTATION ABOVE KNEE-LEFT;  Surgeon: Mal Misty, MD;  Location: [place];  Service: Vascular;  Laterality: Left;  . Removal of graft Left 04/16/2013    Procedure: I & D LEFT AKA WOUND, POSSIBLE REMOVAL OF INFECTED GORTEX GRAFT;  Surgeon: Mal Misty, MD;  Location: [place];  Service: Vascular;  Laterality: Left;  . Abdominal aortagram N/A 09/11/2011    Procedure: ABDOMINAL Maxcine Ham;  Surgeon: Wellington Hampshire, MD;  Location: [facility] CATH LAB;  Service: Cardiovascular;  Laterality: N/A;  . Lower extremity angiogram Left 12/25/2011    Procedure: LOWER EXTREMITY ANGIOGRAM;  Surgeon: Serafina Mitchell, MD;  Location: [facility] CATH LAB;  Service: Cardiovascular;  Laterality: Left;  lt leg angio co2  . Abdominal angiogram  12/25/2011    Procedure: ABDOMINAL  ANGIOGRAM;  Surgeon: Serafina Mitchell, MD;  Location: [facility] CATH LAB;  Service: Cardiovascular;;  . Lower extremity angiogram N/A 07/07/2012    Procedure: LOWER EXTREMITY ANGIOGRAM;  Surgeon: Conrad [place], MD;  Location: [facility] CATH LAB;  Service: Cardiovascular;  Laterality: N/A;  . Lower extremity angiogram N/A 09/15/2012    Procedure: LOWER EXTREMITY ANGIOGRAM;  Surgeon: Serafina Mitchell, MD;  Location: [facility] CATH LAB;  Service: Cardiovascular;  Laterality: N/A;  . Upper extremity angiogram  09/15/2012    Procedure: UPPER EXTREMITY ANGIOGRAM;  Surgeon: Serafina Mitchell, MD;  Location: [facility] CATH LAB;  Service:  Cardiovascular;;  . Biv icd genertaor change out N/A 11/09/2013    Procedure: BIV ICD GENERTAOR CHANGE OUT;  Surgeon: Coralyn Mark, MD;  Location: [facility] CATH LAB;  Service: Cardiovascular;  Laterality: N/A;   Past Medical History  Diagnosis Date  . Gout   . Benign prostatic hypertrophy     takes Proscar daily  . Atrial fibrillation ([place])     takes Warfarin daily  . Chronic systolic dysfunction of left ventricle     a. mixed ischemic and nonischemic CM,  EF 35%. b. s/p AICD implantation.  . ED (erectile dysfunction)   . Arthritis   . Pacemaker     medtronic  . CAD (coronary artery disease)     a. s/p mid LAD stenting with DES 2008 Dr. Daneen Schick  . ICD (implantable cardiac defibrillator) in place     medtronic, Dr. Rayann Heman  . ICD (implantable cardiac defibrillator) in place   . Sleep apnea     hx of "had surgery for"  . Automatic implantable cardioverter-defibrillator in situ   . Depression   . Numbness and tingling     Hx; 41f left foot  . PAD (peripheral artery disease) (HCC)     Severe by PV angiogram 09/2011  . Renal artery stenosis ([place])     s/p stenting 2009  . PAD (peripheral artery disease) (HCC)     s/p multiple LLE bypass grafts; left mid-distal SCA occlusion by 08/2012 duplex  . Hypertension     takes Carvedilol and Losartan daily  . Constipation     takes Miralax daily as needed  and Colace daily   . Type II diabetes mellitus (HCC)     takes Novolog 70/30  . CHF (congestive heart failure) (HCC)     takes Lasix daily  . History of MRSA infection   . Insomnia     TAKES TRAZODONE NIGHTLY   BP 157/77 mmHg  Pulse 67  SpO2 98%  Opioid Risk Score:   Fall Risk Score:  `1  Depression screen PHQ 2/9  Depression screen [facility] 2/9 11/23/2014 06/03/2014  Decreased Interest 2 2  Down, Depressed, Hopeless 1 1  PHQ - 2 Score 3 3  Altered sleeping - 3  Tired, decreased energy - 2  Change in appetite - 0  Feeling bad or failure about yourself  - 0  Trouble concentrating - 1  Moving slowly or fidgety/restless - 0  Suicidal thoughts - 0  PHQ-9 Score - 9      Review of Systems  Cardiovascular:       Limb swelling  Musculoskeletal: Positive for gait problem.  Hematological: Bruises/bleeds easily.  All other systems reviewed and are negative.      Objective:   Physical Exam  Constitutional: He is oriented to person, place, and time. He appears well-developed and well-nourished.  HENT:  Head: Normocephalic and atraumatic.  Neck: Normal range of motion. Neck supple.  Cardiovascular: Normal rate and regular rhythm.   Pulmonary/Chest: Effort normal and breath sounds normal.  Musculoskeletal:  Normal Muscle Bulk and Muscle Testing Reveals:  Upper Extremities: Full ROM and Muscle Strength 5/5 Lower Extremities: Right: Full ROM and Muscle Strength 5/5 Left AKA: Prosthesis Intact Arrived in wheelchair  Neurological: He is alert and oriented to person, place, and time.  Skin: Skin is warm and dry.  Psychiatric: He has a normal mood and affect.  Nursing note and vitals reviewed.         Assessment & Plan:  1. Functional  deficits secondary to left AKA.[place] with Exercise Regime:  2. Phantom limb pain: Continue Gabapentin Refilled: Percocet 10/325 mg percocet one tablet every 4 hours as needed for breakthrough pain. #60. Second script given for the following  month.  20 minutes of face to face patient care time was spent during this visit. All questions were encouraged and answered.

## 2015-03-23 NOTE — Addendum Note (Signed)
Addended by: Geryl Rankins D on: 03/23/2015 01:31 PM   Modules accepted: Orders

## 2015-03-24 ENCOUNTER — Ambulatory Visit (INDEPENDENT_AMBULATORY_CARE_PROVIDER_SITE_OTHER): Payer: PPO | Admitting: Internal Medicine

## 2015-03-24 ENCOUNTER — Encounter: Payer: Self-pay | Admitting: Internal Medicine

## 2015-03-24 VITALS — BP 140/70 | HR 84 | Ht 68.0 in | Wt 182.0 lb

## 2015-03-24 DIAGNOSIS — I482 Chronic atrial fibrillation, unspecified: Secondary | ICD-10-CM

## 2015-03-24 DIAGNOSIS — I5022 Chronic systolic (congestive) heart failure: Secondary | ICD-10-CM

## 2015-03-24 MED ORDER — SILDENAFIL CITRATE 100 MG PO TABS
100.0000 mg | ORAL_TABLET | Freq: Every day | ORAL | Status: DC | PRN
Start: 2015-03-24 — End: 2015-03-24

## 2015-03-24 MED ORDER — SILDENAFIL CITRATE 100 MG PO TABS: ORAL_TABLET | ORAL | Status: DC

## 2015-03-24 NOTE — Progress Notes (Signed)
PCP: Laurey Morale, MD Primary Cardiologist:  Dr Daneen Schick  [name] is a 81 y.o. male with a h/o mixed ischemic/ nonischemic CM, LBBB, NYHA Class III CHF sp BiV ICD (MDT) by Dr Leonia Reeves who presents today to for follow-up in the Electrophysiology device clinic.  He is doing well.  He continues to try to walk with his prosthesis.  + erectile dysfunction.  Today, he  denies symptoms of palpitations, chest pain, orthopnea, PND, dizziness, presyncope, syncope, or other neurologic sequela.    Past Medical History  Diagnosis Date  . Gout   . Benign prostatic hypertrophy     takes Proscar daily  . Atrial fibrillation ([place])     takes Warfarin daily  . Chronic systolic dysfunction of left ventricle     a. mixed ischemic and nonischemic CM,  EF 35%. b. s/p AICD implantation.  . ED (erectile dysfunction)   . Arthritis   . Pacemaker     medtronic  . CAD (coronary artery disease)     a. s/p mid LAD stenting with DES 2008 Dr. Daneen Schick  . ICD (implantable cardiac defibrillator) in place     medtronic, Dr. Rayann Heman  . ICD (implantable cardiac defibrillator) in place   . Sleep apnea     hx of "had surgery for"  . Automatic implantable cardioverter-defibrillator in situ   . Depression   . Numbness and tingling     Hx; 53f left foot  . PAD (peripheral artery disease) (HCC)     Severe by PV angiogram 09/2011  . Renal artery stenosis ([place])     s/p stenting 2009  . PAD (peripheral artery disease) (HCC)     s/p multiple LLE bypass grafts; left mid-distal SCA occlusion by 08/2012 duplex  . Hypertension     takes Carvedilol and Losartan daily  . Constipation     takes Miralax daily as needed and Colace daily   . Type II diabetes mellitus (HCC)     takes Novolog 70/30  . CHF (congestive heart failure) (HCC)     takes Lasix daily  . History of MRSA infection   . Insomnia     TAKES TRAZODONE NIGHTLY    Past Surgical History  Procedure Laterality Date  . Turp vaporization    . Cardiac  defibrillator placement  12/26/09    pacemaker combo  . Cervical epidural injection  2013  . Femoral-tibial bypass graft  09/25/2011    Procedure: BYPASS GRAFT FEMORAL-TIBIAL ARTERY;  Surgeon: Mal Misty, MD;  Location: [facility] OR;  Service: Vascular;  Laterality: Left;  Left Femoral - Anterior Tibial Bypass;  saphenous vein graft from left leg  . Intraoperative arteriogram  09/25/2011    Procedure: INTRA OPERATIVE ARTERIOGRAM;  Surgeon: Mal Misty, MD;  Location: [place];  Service: Vascular;  Laterality: Left;  . Femoral-tibial bypass graft  02/07/2012    Procedure: BYPASS GRAFT FEMORAL-TIBIAL ARTERY;  Surgeon: Rosetta Posner, MD;  Location: [place];  Service: Vascular;  Laterality: Left;  Thrombectomy Left Femoral - Tibial Bypass Graft  . Embolectomy  02/07/2012    Procedure: EMBOLECTOMY;  Surgeon: Rosetta Posner, MD;  Location: [place];  Service: Vascular;  Laterality: Left;  . Femoral-tibial bypass graft  04/03/2012    Procedure: BYPASS GRAFT FEMORAL-TIBIAL ARTERY;  Surgeon: Mal Misty, MD;  Location: [place];  Service: Vascular;  Laterality: Left;  Redo  . Insert / replace / remove pacemaker  2007  . Coronary angioplasty with stent placement  ~  2000  . Coronary angioplasty    . Uvulopalatopharyngoplasty (uppp)/tonsillectomy/septoplasty  06/30/2003    Archie Endo 06/30/2003 (07/10/2012)  . Shoulder open rotator cuff repair Right 2001    repair of lacerated right/notes 10/11/1999  (07/10/2012)  . Foot surgery Right 03/20/2001    "have plates and screws in; didn't break it" (07/10/2012)  . Carpal tunnel release Right 2002    Archie Endo 03/20/2001 (07/10/2012)  . Biceps tendon repair Right 2001    Archie Endo 03/20/2001 (07/10/2012)  . Renal artery stent  2009  . Femoral-popliteal bypass graft Left 09/16/2012    Procedure: LEFT FEMORAL-POPLITEAL BYPASS GRAFT WITH GORTEX Propaten Graft 6x80 Thin Wall and Left lower leg Angiogram;  Surgeon: Mal Misty, MD;  Location: [place];  Service: Vascular;  Laterality: Left;  .  Colonoscopy      Hx; of  . Tonsillectomy    . Adenoidectomy      Hx; of   . Femoral-tibial bypass graft Left 09/30/2012    Procedure: REDO LEFT FEMORAL-ANTERIOR TIBIAL ARTERY BYPASS USING COMPOSITE CEPHALIC AND BASILIC VEIN GRAFT FROM LEFT ARM;  Surgeon: Mal Misty, MD;  Location: [place];  Service: Vascular;  Laterality: Left;  . I&d extremity Left 10/21/2012    Procedure: EXPLORATION AND DEBRIDEMENT OF LEFT GROIN WOUND;  Surgeon: Mal Misty, MD;  Location: [place];  Service: Vascular;  Laterality: Left;  . Patch angioplasty Left 10/21/2012    Procedure: PATCH ANGIOPLASTY;  Surgeon: Mal Misty, MD;  Location: [place];  Service: Vascular;  Laterality: Left;  . Groin debridement Left 11/12/2012    Procedure: CLOSURE INGUINAL WOUND;  Surgeon: Mal Misty, MD;  Location: [place];  Service: Vascular;  Laterality: Left;  . Embolectomy Left 12/16/2012    Procedure: THROMBECTOMY  LEFT LEG BYPASS;  Surgeon: Elam Dutch, MD;  Location: [place];  Service: Vascular;  Laterality: Left;  . Leg amputation above knee Left 02/04/2013    DR LAWSON  . Amputation Left 02/04/2013    Procedure: AMPUTATION ABOVE KNEE-LEFT;  Surgeon: Mal Misty, MD;  Location: [place];  Service: Vascular;  Laterality: Left;  . Removal of graft Left 04/16/2013    Procedure: I & D LEFT AKA WOUND, POSSIBLE REMOVAL OF INFECTED GORTEX GRAFT;  Surgeon: Mal Misty, MD;  Location: [place];  Service: Vascular;  Laterality: Left;  . Abdominal aortagram N/A 09/11/2011    Procedure: ABDOMINAL Maxcine Ham;  Surgeon: Wellington Hampshire, MD;  Location: [facility] CATH LAB;  Service: Cardiovascular;  Laterality: N/A;  . Lower extremity angiogram Left 12/25/2011    Procedure: LOWER EXTREMITY ANGIOGRAM;  Surgeon: Serafina Mitchell, MD;  Location: [facility] CATH LAB;  Service: Cardiovascular;  Laterality: Left;  lt leg angio co2  . Abdominal angiogram  12/25/2011    Procedure: ABDOMINAL ANGIOGRAM;  Surgeon: Serafina Mitchell, MD;  Location: [facility] CATH LAB;  Service:  Cardiovascular;;  . Lower extremity angiogram N/A 07/07/2012    Procedure: LOWER EXTREMITY ANGIOGRAM;  Surgeon: Conrad Powderly, MD;  Location: [facility] CATH LAB;  Service: Cardiovascular;  Laterality: N/A;  . Lower extremity angiogram N/A 09/15/2012    Procedure: LOWER EXTREMITY ANGIOGRAM;  Surgeon: Serafina Mitchell, MD;  Location: [facility] CATH LAB;  Service: Cardiovascular;  Laterality: N/A;  . Upper extremity angiogram  09/15/2012    Procedure: UPPER EXTREMITY ANGIOGRAM;  Surgeon: Serafina Mitchell, MD;  Location: [facility] CATH LAB;  Service: Cardiovascular;;  . Biv icd genertaor change out N/A 11/09/2013    Procedure: BIV ICD GENERTAOR  CHANGE OUT;  Surgeon: Coralyn Mark, MD;  Location: [facility] CATH LAB;  Service: Cardiovascular;  Laterality: N/A;    Social History   Social History  . Marital Status: Widowed    Spouse Name: N/A  . Number of Children: N/A  . Years of Education: N/A   Occupational History  . Retired    Social History Main Topics  . Smoking status: Current Some Day Smoker    Types: Cigars  . Smokeless tobacco: Never Used     Comment: 1 cigar each month  . Alcohol Use: 0.0 oz/week    0 Standard drinks or equivalent per week     Comment: once a week wine  . Drug Use: No  . Sexual Activity: Not Currently   Other Topics Concern  . Not on file   Social History Narrative   Lives in [place] with significant other,  [name].    Family History  Problem Relation Age of Onset  . Cancer      breast/fhx  . Heart disease      fhx  . Diabetes Neg Hx   . Cancer Father     Allergies  Allergen Reactions  . Other Other (See Comments)    Plastic tape tears skin  . Bactrim Ds [Sulfamethoxazole-Trimethoprim] Other (See Comments)    Hand tremors    Current Outpatient Prescriptions  Medication Sig Dispense Refill  . allopurinol (ZYLOPRIM) 100 MG tablet Take 100 mg by mouth daily as needed (Gout).    Marland Kitchen amLODipine (NORVASC) 5 MG tablet Take 5 mg by mouth daily.    . carvedilol (COREG) 25 MG  tablet Take 1 tablet (25 mg total) by mouth 2 (two) times daily with a meal. 180 tablet 3  . finasteride (PROSCAR) 5 MG tablet Take 1 tablet (5 mg total) by mouth daily. 90 tablet 3  . furosemide (LASIX) 40 MG tablet Take 20 mg by mouth daily.     Marland Kitchen gabapentin (NEURONTIN) 300 MG capsule Take 1 capsule (300 mg total) by mouth 3 (three) times daily. 270 capsule 0  . glucose blood (ONE TOUCH ULTRA TEST) test strip use to test blood sugar two times daily as instructed by physician. 100 each 1  . insulin aspart protamine- aspart (NOVOLOG MIX 70/30) (70-30) 100 UNIT/ML injection Inject 7 Units into the skin 2 (two) times daily with a meal.    . Insulin Pen Needle 31G X 8 MM MISC Use 2-3 times per day and diagnosis code is 250.00 100 each 1  . losartan (COZAAR) 100 MG tablet Take 1 tablet (100 mg total) by mouth daily. 90 tablet 3  . nitroGLYCERIN (NITROSTAT) 0.4 MG SL tablet Place 1 tablet (0.4 mg total) under the tongue every 5 (five) minutes as needed for chest pain. For chest pain, max 3 doses 90 tablet 3  . oxybutynin (DITROPAN) 5 MG tablet Take 1 tablet (5 mg total) by mouth 2 (two) times daily. 180 tablet 3  . oxyCODONE-acetaminophen (PERCOCET) 10-325 MG tablet Take 1 tablet by mouth every 4 (four) hours as needed for pain. 60 tablet 0  . warfarin (COUMADIN) 5 MG tablet TAKE AS DIRECTED BY ANTICOAGULATION CLINIC (Patient taking differently: TAKE AS DIRECTED BY ANTICOAGULATION CLINIC ( 2.5 mg every day )) 60 tablet 1   No current facility-administered medications for this visit.   Physical Exam: Filed Vitals:   03/24/15 1546  BP: 140/70  Pulse: 84  Height: 5\' 8"  (1.727 m)  Weight: 182 lb (82.555 kg)  GEN- The patient is chronically ill appearing, alert and oriented x 3 today.   Head- normocephalic, atraumatic Eyes-  Sclera clear, conjunctiva pink Ears- hearing intact Oropharynx- clear Neck- supple, no JVP Lungs- Clear to ausculation bilaterally, normal work of breathing Chest- ICD  pocket is well healed Heart- Regular rate and rhythm, no murmurs, rubs or gallops, PMI not laterally displaced GI- soft, NT, ND, + BS Extremities- s/p L AKA  ICD interrogation- reviewed in detail today,  See PACEART report ekg today reveals afib, V paced at 70 bpm  Assessment and Plan:  1. Chronic systolic dysfunction Doing well s/p gen change See Pace Art report No changes today 99% BiV paced euvolemic today Followed in ICM clinic  2. Permanent afib Rate controlled Now back on coumadin If INRs become labile again, could consider WATCHMAN left atrial appendage occlusive device  Return to see Dr Tamala Julian as scheduled carelink Return to see EP NP in 1 year  Thompson Grayer MD, [facility] 03/24/2015 4:17 PM

## 2015-03-24 NOTE — Patient Instructions (Addendum)
Medication Instructions:  Your physician has recommended you make the following change in your medication: 1) START Viagra 50 mg once daily as needed for erectile dysfunction    Labwork: None ordered  Testing/Procedures: None ordered  Follow-Up: Remote monitoring is used to monitor your Pacemaker of ICD from home. This monitoring reduces the number of office visits required to check your device to one time per year. It allows [place] to keep an eye on the functioning of your device to ensure it is working properly. You are scheduled for a device check from home on 06/26/15. You may send your transmission at any time that day. If you have a wireless device, the transmission will be sent automatically. After your physician reviews your transmission, you will receive a postcard with your next transmission date.  Your physician wants you to follow-up in: 1 year with Chanetta Marshall, NP.  You will receive a reminder letter in the mail two months in advance. If you don't receive a letter, please call our office to schedule the follow-up appointment.  If you need a refill on your cardiac medications before your next appointment, please call your pharmacy.  Thank you for choosing [facility]!!

## 2015-03-28 ENCOUNTER — Ambulatory Visit: Payer: PPO | Admitting: Physical Therapy

## 2015-03-28 ENCOUNTER — Encounter: Payer: Self-pay | Admitting: Physical Therapy

## 2015-03-28 DIAGNOSIS — M24652 Ankylosis, left hip: Secondary | ICD-10-CM

## 2015-03-28 DIAGNOSIS — R269 Unspecified abnormalities of gait and mobility: Secondary | ICD-10-CM | POA: Diagnosis not present

## 2015-03-28 DIAGNOSIS — Z7409 Other reduced mobility: Secondary | ICD-10-CM

## 2015-03-28 DIAGNOSIS — R2689 Other abnormalities of gait and mobility: Secondary | ICD-10-CM

## 2015-03-28 DIAGNOSIS — R6889 Other general symptoms and signs: Secondary | ICD-10-CM

## 2015-03-28 DIAGNOSIS — Z4789 Encounter for other orthopedic aftercare: Secondary | ICD-10-CM

## 2015-03-28 DIAGNOSIS — Z89612 Acquired absence of left leg above knee: Secondary | ICD-10-CM

## 2015-03-28 DIAGNOSIS — R531 Weakness: Secondary | ICD-10-CM

## 2015-03-28 NOTE — Therapy (Signed)
[facility] [address] [place], [place], [address] Phone: [phone]   Fax:  [phone]  Physical Therapy Treatment  Patient Details  Name: [name] MRN: [id number] Date of Birth: 1935/01/04 Referring Provider: Alger Simons, MD  Encounter Date: 03/28/2015      PT End of Session - 03/28/15 1145    Visit Number 12   Number of Visits 18   Date for PT Re-Evaluation 03/31/15   Authorization Type Medicare G-code & progress report   PT Start Time 1102   PT Stop Time 1145   PT Time Calculation (min) 43 min   Equipment Utilized During Treatment Gait belt   Activity Tolerance Patient tolerated treatment well   Behavior During Therapy [facility] for tasks assessed/performed      Past Medical History  Diagnosis Date  . Gout   . Benign prostatic hypertrophy     takes Proscar daily  . Atrial fibrillation ([place])     takes Warfarin daily  . Chronic systolic dysfunction of left ventricle     a. mixed ischemic and nonischemic CM,  EF 35%. b. s/p AICD implantation.  . ED (erectile dysfunction)   . Arthritis   . Pacemaker     medtronic  . CAD (coronary artery disease)     a. s/p mid LAD stenting with DES 2008 Dr. Daneen Schick  . ICD (implantable cardiac defibrillator) in place     medtronic, Dr. Rayann Heman  . ICD (implantable cardiac defibrillator) in place   . Sleep apnea     hx of "had surgery for"  . Automatic implantable cardioverter-defibrillator in situ   . Depression   . Numbness and tingling     Hx; 30fleft foot  . PAD (peripheral artery disease) (HCC)     Severe by PV angiogram 09/2011  . Renal artery stenosis ([place]     s/p stenting 2009  . PAD (peripheral artery disease) (HCC)     s/p multiple LLE bypass grafts; left mid-distal SCA occlusion by 08/2012 duplex  . Hypertension     takes Carvedilol and Losartan daily  . Constipation     takes Miralax daily as needed and Colace daily   . Type II diabetes  mellitus (HCC)     takes Novolog 70/30  . CHF (congestive heart failure) (HCC)     takes Lasix daily  . History of MRSA infection   . Insomnia     TAKES TRAZODONE NIGHTLY    Past Surgical History  Procedure Laterality Date  . Turp vaporization    . Cardiac defibrillator placement  12/26/09    pacemaker combo  . Cervical epidural injection  2013  . Femoral-tibial bypass graft  09/25/2011    Procedure: BYPASS GRAFT FEMORAL-TIBIAL ARTERY;  Surgeon: JMal Misty MD;  [facility]  Service: Vascular;  Laterality: Left;  Left Femoral - Anterior Tibial Bypass;  saphenous vein graft from left leg  . Intraoperative arteriogram  09/25/2011    Procedure: INTRA OPERATIVE ARTERIOGRAM;  Surgeon: JMal Misty MD;  [place] [facility]  Service: Vascular;  Laterality: Left;  . Femoral-tibial bypass graft  02/07/2012    Procedure: BYPASS GRAFT FEMORAL-TIBIAL ARTERY;  Surgeon: TRosetta Posner MD;  [place] [facility]  Service: Vascular;  Laterality: Left;  Thrombectomy Left Femoral - Tibial Bypass Graft  . Embolectomy  02/07/2012    Procedure: EMBOLECTOMY;  Surgeon: TRosetta Posner MD;  [place] [facility]  Service: Vascular;  Laterality: Left;  .  Femoral-tibial bypass graft  04/03/2012    Procedure: BYPASS GRAFT FEMORAL-TIBIAL ARTERY;  Surgeon: Mal Misty, MD;  Location: [place];  Service: Vascular;  Laterality: Left;  Redo  . Insert / replace / remove pacemaker  2007  . Coronary angioplasty with stent placement  ~ 2000  . Coronary angioplasty    . Uvulopalatopharyngoplasty (uppp)/tonsillectomy/septoplasty  06/30/2003    Archie Endo 06/30/2003 (07/10/2012)  . Shoulder open rotator cuff repair Right 2001    repair of lacerated right/notes 10/11/1999  (07/10/2012)  . Foot surgery Right 03/20/2001    "have plates and screws in; didn't break it" (07/10/2012)  . Carpal tunnel release Right 2002    Archie Endo 03/20/2001 (07/10/2012)  . Biceps tendon repair Right 2001    Archie Endo 03/20/2001 (07/10/2012)  . Renal artery stent  2009  .  Femoral-popliteal bypass graft Left 09/16/2012    Procedure: LEFT FEMORAL-POPLITEAL BYPASS GRAFT WITH GORTEX Propaten Graft 6x80 Thin Wall and Left lower leg Angiogram;  Surgeon: Mal Misty, MD;  Location: [facility];  Service: Vascular;  Laterality: Left;  . Colonoscopy      Hx; of  . Tonsillectomy    . Adenoidectomy      Hx; of   . Femoral-tibial bypass graft Left 09/30/2012    Procedure: REDO LEFT FEMORAL-ANTERIOR TIBIAL ARTERY BYPASS USING COMPOSITE CEPHALIC AND BASILIC VEIN GRAFT FROM LEFT ARM;  Surgeon: Mal Misty, MD;  Location: [place];  Service: Vascular;  Laterality: Left;  . I&d extremity Left 10/21/2012    Procedure: EXPLORATION AND DEBRIDEMENT OF LEFT GROIN WOUND;  Surgeon: Mal Misty, MD;  Location: [place];  Service: Vascular;  Laterality: Left;  . Patch angioplasty Left 10/21/2012    Procedure: PATCH ANGIOPLASTY;  Surgeon: Mal Misty, MD;  Location: [place];  Service: Vascular;  Laterality: Left;  . Groin debridement Left 11/12/2012    Procedure: CLOSURE INGUINAL WOUND;  Surgeon: Mal Misty, MD;  Location: [place];  Service: Vascular;  Laterality: Left;  . Embolectomy Left 12/16/2012    Procedure: THROMBECTOMY  LEFT LEG BYPASS;  Surgeon: Elam Dutch, MD;  Location: [place];  Service: Vascular;  Laterality: Left;  . Leg amputation above knee Left 02/04/2013    DR LAWSON  . Amputation Left 02/04/2013    Procedure: AMPUTATION ABOVE KNEE-LEFT;  Surgeon: Mal Misty, MD;  Location: [place];  Service: Vascular;  Laterality: Left;  . Removal of graft Left 04/16/2013    Procedure: I & D LEFT AKA WOUND, POSSIBLE REMOVAL OF INFECTED GORTEX GRAFT;  Surgeon: Mal Misty, MD;  Location: [place];  Service: Vascular;  Laterality: Left;  . Abdominal aortagram N/A 09/11/2011    Procedure: ABDOMINAL Maxcine Ham;  Surgeon: Wellington Hampshire, MD;  [facility] C[facility] LAB;  Service: Cardiovascular;  Laterality: N/A;  . Lower extremity angiogram Left 12/25/2011    Procedure: LOWER EXTREMITY  ANGIOGRAM;  Surgeon: Serafina Mitchell, MD;  Location: [facility] CATH LAB;  Service: Cardiovascular;  Laterality: Left;  lt leg angio co2  . Abdominal angiogram  12/25/2011    Procedure: ABDOMINAL ANGIOGRAM;  Surgeon: Serafina Mitchell, MD;  Location: [facility] CATH LAB;  Service: Cardiovascular;;  . Lower extremity angiogram N/A 07/07/2012    Procedure: LOWER EXTREMITY ANGIOGRAM;  Surgeon: [place] Montmorenci, MD;  Location: [facility] CATH LAB;  Service: Cardiovascular;  Laterality: N/A;  . Lower extremity angiogram N/A 09/15/2012    Procedure: LOWER EXTREMITY ANGIOGRAM;  Surgeon: Serafina Mitchell, MD;  Location: [facility] CATH LAB;  Service: Cardiovascular;  Laterality: N/A;  . Upper extremity angiogram  09/15/2012    Procedure: UPPER EXTREMITY ANGIOGRAM;  Surgeon: Serafina Mitchell, MD;  Location[facility] CATH LAB;  Service: Cardiovascular;;  . Biv icd genertaor change out N/A 11/09/2013    Procedure: BIV ICD GENERTAOR CHANGE OUT;  Surgeon: Coralyn Mark, MD;  Location: [facility] CATH LAB;  Service: Cardiovascular;  Laterality: N/A;    There were no vitals filed for this visit.  Visit Diagnosis:  Abnormality of gait  Balance problems  Impaired functional mobility and activity tolerance  Weakness generalized  Activity intolerance  Encounter for prosthetic gait training  Status post above knee amputation of left lower extremity (HCC)  Decreased range of motion of hip, left      Subjective Assessment - 03/28/15 1106    Subjective No falls. He went to cardiologist with no changes or issues noted.    Currently in Pain? No/denies     Patient verbalized understanding of ongoing fitness plan and increasing activity level.         Prosthetics Assessment - 03/28/15 1100    Prosthetics   Prosthetic Care Independent with Skin check;Residual limb care;Prosthetic cleaning;Ply sock cleaning;Correct ply sock adjustment;Proper wear schedule/adjustment;Proper weight-bearing schedule/adjustment   K code/activity level with prosthetic use  2                     [facility] Adult PT Treatment/Exercise - 03/28/15 1100    Transfers   Sit to Stand 6: Modified independent (Device/Increase time);With upper extremity assist;With armrests;From chair/3-in-1  chairs with & without armrests   Sit to Stand Details --   Stand to Sit 6: Modified independent (Device/Increase time);With upper extremity assist;With armrests;To chair/3-in-1  chairs with  & without armrests   Stand to Sit Details (indicate cue type and reason) --   Ambulation/Gait   Ambulation/Gait Yes   Ambulation/Gait Assistance 5: Supervision   Ambulation/Gait Assistance Details Patient ambulated near table or counter for security but ambulated 20' with [facility] including carrying cup of water modified independent. PT cued gait with RW to flex knee in swing and increase wt shift /time on prosthesis to enable right foot contact without noise.    Ambulation Distance (Feet) 220 Feet  220' & 150' with RW, 20' with [facility]   Assistive device Prosthesis;Rolling walker;Small based quad cane   Gait Pattern Step-through pattern;Step-to pattern;Decreased stance time - left;Decreased step length - right;Decreased hip/knee flexion - left;Left circumduction;Left hip hike;Trunk flexed   Ambulation Surface Indoor;Level   Stairs Yes   Stairs Assistance 6: Modified independent (Device/Increase time)   Stair Management Technique Step to pattern;Forwards;One rail Right;One rail Left;With cane   Number of Stairs 4  3 reps with variation of rail location   Ramp 6: Modified independent (Device)  walker/prosthesis   Curb 6: Modified independent (Device/increase time)  walker/prosthesis   Neuro Re-ed    Neuro Re-ed Details  --   Prosthetics   Current prosthetic wear tolerance (days/week)  daily   Current prosthetic wear tolerance (#hours/day)  most awake hours, drying as needed (~8-9 hours a day)   Residual limb condition  intact   Education Provided Residual limb care;Proper weight-bearing  schedule/adjustment;Correct ply sock adjustment                  PT Short Term Goals - 03/03/15 1108    PT SHORT TERM GOAL #1   Title donnes prosthesis with new suspension correctly independently. (Target Date: 03/03/2015)   Baseline  MET 03/02/2015   Status Achieved   PT SHORT TERM GOAL #2   Title demonstrates understanding of initial HEP  (Target Date: 03/03/2015)   Baseline MET 03/02/2015   Status Achieved   PT SHORT TERM GOAL #3   Title wears prosthesis >70% of awake hours >/= 6 days /wk.  (Target Date: 03/03/2015)   Baseline 03/03/15: wearing most awake hours (8-9 a day)   Status Achieved   PT SHORT TERM GOAL #4   Title ambulates 100' with rolling walker & prosthesis with cues only for deviations.  (Target Date: 03/03/2015)   Baseline MET 03/02/2015 He ambulates 140' with RW & prosthesis with cues only for deviations.    Status Achieved   PT SHORT TERM GOAL #5   Title stands 60sec without UE support with supervision.  (Target Date: 03/03/2015)   Baseline 03/03/15: pt able to stand for ~30 seconds before needed to suppport himself   Status Not Met   PT SHORT TERM GOAL #6   Title pt ambulates 59' with a cane along counter or table for contact with LUE with supervision.  (Target Date: 03/03/2015)   Baseline MET 03/02/2015   Status Achieved   PT SHORT TERM GOAL #7   Title pt negotiates ramps and curb with RW & prosthesis with supervision.  (Target Date: 03/03/2015)   Baseline met on 03/03/15   Time --   Period --   Status Achieved   PT SHORT TERM GOAL #8   Title pt able to transfer safely from [facility] to chair with armrests or car without RW with supervision.  (Target Date: 03/03/2015)   Baseline MET 03/02/2015   Status Achieved           PT Long Term Goals - 03/28/15 1145    PT LONG TERM GOAL #1   Title wears prosthesis >80% of awake hours daily.  (Target Date: 03/31/2015)   Baseline MET 03/28/2015   Time 2   Period Months   Status Achieved   PT LONG TERM GOAL  #2   Title verbalize understanding of ongoing fitness plan.  (Target Date: 03/31/2015)   Baseline MET 03/28/2015   Time 2   Period Months   Status Achieved   PT LONG TERM GOAL #3   Title ambulates 250' with rolling walker & prosthesis modified independent.  (Target Date: 03/31/2015)   Time 2   Period Months   Status On-going   PT LONG TERM GOAL #4   Title Berg Balance >/= 20/56  (Target Date: 03/31/2015)   Time 2   Period Months   Status On-going   PT LONG TERM GOAL #5   Title negotiate ramp, curb & stairs (1 rail) with rolling walker & prosthesis modified independent.  (Target Date: 03/31/2015)   Baseline MET 03/28/2015   Time 2   Period Months   Status Achieved   PT LONG TERM GOAL #6   Title ambulates 50' with [facility] & prosthesis modified independent.  (Target Date: 03/31/2015)   Baseline MET 03/28/2015   Time 2   Period Months   Status Achieved               Plan - 03/28/15 1145    Clinical Impression Statement Patient met 4 of 6 LTGs. Other 2 LTGs to be checked on next visit. PT anticipates discharge at next visit.    Pt will benefit from skilled therapeutic intervention in order to improve on the following deficits Abnormal gait;Decreased activity tolerance;Decreased balance;Decreased endurance;Decreased knowledge of use  of DME;Decreased mobility;Decreased safety awareness;Decreased range of motion;Decreased strength;Impaired flexibility;Postural dysfunction;Prosthetic Dependency   Rehab Potential Good   PT Frequency 2x / week   PT Duration Other (comment)  9 weeks (60 days)   PT Treatment/Interventions ADLs/Self Care Home Management;DME Instruction;Gait training;Stair training;Functional mobility training;Therapeutic activities;Therapeutic exercise;Balance training;Neuromuscular re-education;Patient/family education;Prosthetic Training   PT Next Visit Plan assess last 2 LTGs for discharge next visit   Consulted and Agree with Plan of Care Patient        Problem  List Patient Active Problem List   Diagnosis Date Noted  . Phantom limb syndrome with pain ([place]) 08/03/2014  . Diabetic polyneuropathy associated with diabetes mellitus due to underlying condition ([place]) 07/12/2014  . CVA (cerebral infarction) 06/15/2014  . Expressive aphasia 06/15/2014  . CKD (chronic kidney disease) stage 4, GFR 15-29 ml/min (HCC) 06/15/2014  . DM (diabetes mellitus), type 2, uncontrolled, with renal complications ([place]) 40/98/1191  . Unilateral AKA ([place]) 02/09/2013  . S/P ICD (internal cardiac defibrillator) procedure 05/18/2012  . PAD (peripheral artery disease) ([place]) 09/04/2011  . CAD (coronary artery disease)   . Chronic systolic congestive heart failure ([place]) 07/06/2010  . Gout 09/18/2007  . Essential hypertension 01/23/2007  . Atrial fibrillation ([place]) 01/23/2007  . BPH (benign prostatic hyperplasia) 01/23/2007    Jamey Reas PT, DPT 03/28/2015, 3:30 PM  [facility] [address] [place], [place], [address] Phone: [phone]   Fax:  [phone]  Name: [name] MRN: [id number] Date of Birth: 18-Mar-1934

## 2015-03-29 ENCOUNTER — Telehealth: Payer: Self-pay | Admitting: Family Medicine

## 2015-03-29 NOTE — Telephone Encounter (Signed)
Patient would like to ask some questions about his Viagra prescription.

## 2015-03-30 ENCOUNTER — Encounter: Payer: Self-pay | Admitting: Physical Therapy

## 2015-03-30 ENCOUNTER — Ambulatory Visit: Payer: PPO | Admitting: Physical Therapy

## 2015-03-30 DIAGNOSIS — R269 Unspecified abnormalities of gait and mobility: Secondary | ICD-10-CM | POA: Diagnosis not present

## 2015-03-30 DIAGNOSIS — Z7409 Other reduced mobility: Secondary | ICD-10-CM

## 2015-03-30 DIAGNOSIS — R6889 Other general symptoms and signs: Secondary | ICD-10-CM

## 2015-03-30 DIAGNOSIS — R2689 Other abnormalities of gait and mobility: Secondary | ICD-10-CM

## 2015-03-30 DIAGNOSIS — R531 Weakness: Secondary | ICD-10-CM

## 2015-03-30 DIAGNOSIS — Z4789 Encounter for other orthopedic aftercare: Secondary | ICD-10-CM

## 2015-03-30 NOTE — Therapy (Signed)
[facility] [address] [place], [place], [address] Phone: [phone]   Fax:  [phone]  Physical Therapy Treatment  Patient Details  Name: [name] MRN: [id number] Date of Birth: 29-Mar-1934 Referring Provider: Alger Simons, MD  Encounter Date: 03/30/2015      PT End of Session - 03/30/15 1104    Visit Number 13   Number of Visits 18   Date for PT Re-Evaluation 03/31/15   Authorization Type Medicare G-code & progress report   PT Start Time 1102   PT Stop Time 1145   PT Time Calculation (min) 43 min   Equipment Utilized During Treatment Gait belt   Activity Tolerance Patient tolerated treatment well   Behavior During Therapy [facility] for tasks assessed/performed      Past Medical History  Diagnosis Date  . Gout   . Benign prostatic hypertrophy     takes Proscar daily  . Atrial fibrillation ([place])     takes Warfarin daily  . Chronic systolic dysfunction of left ventricle     a. mixed ischemic and nonischemic CM,  EF 35%. b. s/p AICD implantation.  . ED (erectile dysfunction)   . Arthritis   . Pacemaker     medtronic  . CAD (coronary artery disease)     a. s/p mid LAD stenting with DES 2008 Dr. Daneen Schick  . ICD (implantable cardiac defibrillator) in place     medtronic, Dr. Rayann Heman  . ICD (implantable cardiac defibrillator) in place   . Sleep apnea     hx of "had surgery for"  . Automatic implantable cardioverter-defibrillator in situ   . Depression   . Numbness and tingling     Hx; 63fleft foot  . PAD (peripheral artery disease) (HCC)     Severe by PV angiogram 09/2011  . [place]     s/p stenting 2009  . PAD (peripheral artery disease) (HCC)     s/p multiple LLE bypass grafts; left mid-distal SCA occlusion by 08/2012 duplex  . Hypertension     takes Carvedilol and Losartan daily  . Constipation     takes Miralax daily as needed and Colace daily   . Type II diabetes  mellitus (HCC)     takes Novolog 70/30  . CHF (congestive heart failure) (HCC)     takes Lasix daily  . History of MRSA infection   . Insomnia     TAKES TRAZODONE NIGHTLY    Past Surgical History  Procedure Laterality Date  . Turp vaporization    . Cardiac defibrillator placement  12/26/09    pacemaker combo  . Cervical epidural injection  2013  . Femoral-tibial bypass graft  09/25/2011    Procedure: BYPASS GRAFT FEMORAL-TIBIAL ARTERY;  Surgeon: JMal Misty MD;  [facility];  Service: Vascular;  Laterality: Left;  Left Femoral - Anterior Tibial Bypass;  saphenous vein graft from left leg  . Intraoperative arteriogram  09/25/2011    Procedure: INTRA OPERATIVE ARTERIOGRAM;  Surgeon: JMal Misty MD;  [place] [facility]  Service: Vascular;  Laterality: Left;  . Femoral-tibial bypass graft  02/07/2012    Procedure: BYPASS GRAFT FEMORAL-TIBIAL ARTERY;  Surgeon: TRosetta Posner MD;  [facility] [facility]  Service: Vascular;  Laterality: Left;  Thrombectomy Left Femoral - Tibial Bypass Graft  . Embolectomy  02/07/2012    Procedure: EMBOLECTOMY;  Surgeon: TRosetta Posner MD;  [place] [facility]  Service: Vascular;  Laterality: Left;  .  Femoral-tibial bypass graft  04/03/2012    Procedure: BYPASS GRAFT FEMORAL-TIBIAL ARTERY;  Surgeon: Mal Misty, MD;  Location: [place];  Service: Vascular;  Laterality: Left;  Redo  . Insert / replace / remove pacemaker  2007  . Coronary angioplasty with stent placement  ~ 2000  . Coronary angioplasty    . Uvulopalatopharyngoplasty (uppp)/tonsillectomy/septoplasty  06/30/2003    Archie Endo 06/30/2003 (07/10/2012)  . Shoulder open rotator cuff repair Right 2001    repair of lacerated right/notes 10/11/1999  (07/10/2012)  . Foot surgery Right 03/20/2001    "have plates and screws in; didn't break it" (07/10/2012)  . Carpal tunnel release Right 2002    Archie Endo 03/20/2001 (07/10/2012)  . Biceps tendon repair Right 2001    Archie Endo 03/20/2001 (07/10/2012)  . Renal artery stent  2009  .  Femoral-popliteal bypass graft Left 09/16/2012    Procedure: LEFT FEMORAL-POPLITEAL BYPASS GRAFT WITH GORTEX Propaten Graft 6x80 Thin Wall and Left lower leg Angiogram;  Surgeon: Mal Misty, MD;  Location: [facility];  Service: Vascular;  Laterality: Left;  . Colonoscopy      Hx; of  . Tonsillectomy    . Adenoidectomy      Hx; of   . Femoral-tibial bypass graft Left 09/30/2012    Procedure: REDO LEFT FEMORAL-ANTERIOR TIBIAL ARTERY BYPASS USING COMPOSITE CEPHALIC AND BASILIC VEIN GRAFT FROM LEFT ARM;  Surgeon: Mal Misty, MD;  Location: [facility];  Service: Vascular;  Laterality: Left;  . I&d extremity Left 10/21/2012    Procedure: EXPLORATION AND DEBRIDEMENT OF LEFT GROIN WOUND;  Surgeon: Mal Misty, MD;  Location: [facility];  Service: Vascular;  Laterality: Left;  . Patch angioplasty Left 10/21/2012    Procedure: PATCH ANGIOPLASTY;  Surgeon: Mal Misty, MD;  Location: [facility];  Service: Vascular;  Laterality: Left;  . Groin debridement Left 11/12/2012    Procedure: CLOSURE INGUINAL WOUND;  Surgeon: Mal Misty, MD;  Location: [facility];  Service: Vascular;  Laterality: Left;  . Embolectomy Left 12/16/2012    Procedure: THROMBECTOMY  LEFT LEG BYPASS;  Surgeon: Elam Dutch, MD;  Location: [facility];  Service: Vascular;  Laterality: Left;  . Leg amputation above knee Left 02/04/2013    DR LAWSON  . Amputation Left 02/04/2013    Procedure: AMPUTATION ABOVE KNEE-LEFT;  Surgeon: Mal Misty, MD;  Location: [facility];  Service: Vascular;  Laterality: Left;  . Removal of graft Left 04/16/2013    Procedure: I & D LEFT AKA WOUND, POSSIBLE REMOVAL OF INFECTED GORTEX GRAFT;  Surgeon: Mal Misty, MD;  Location: [facility];  Service: Vascular;  Laterality: Left;  . Abdominal aortagram N/A 09/11/2011    Procedure: ABDOMINAL Maxcine Ham;  Surgeon: Wellington Hampshire, MD;  [facility] C[facility] LAB;  Service: Cardiovascular;  Laterality: N/A;  . Lower extremity angiogram Left 12/25/2011    Procedure: LOWER EXTREMITY  ANGIOGRAM;  Surgeon: Serafina Mitchell, MD;  Location: [facility] CATH LAB;  Service: Cardiovascular;  Laterality: Left;  lt leg angio co2  . Abdominal angiogram  12/25/2011    Procedure: ABDOMINAL ANGIOGRAM;  Surgeon: Serafina Mitchell, MD;  Location: [facility] CATH LAB;  Service: Cardiovascular;;  . Lower extremity angiogram N/A 07/07/2012    Procedure: LOWER EXTREMITY ANGIOGRAM;  Surgeon: Conrad Montmorenci, MD;  Location: [facility] CATH LAB;  Service: Cardiovascular;  Laterality: N/A;  . Lower extremity angiogram N/A 09/15/2012    Procedure: LOWER EXTREMITY ANGIOGRAM;  Surgeon: Serafina Mitchell, MD;  Location: [facility] CATH LAB;  Service: Cardiovascular;  Laterality: N/A;  . Upper extremity angiogram  09/15/2012    Procedure: UPPER EXTREMITY ANGIOGRAM;  Surgeon: Serafina Mitchell, MD;  Location: [facility] CATH LAB;  Service: Cardiovascular;;  . Biv icd genertaor change out N/A 11/09/2013    Procedure: BIV ICD GENERTAOR CHANGE OUT;  Surgeon: Coralyn Mark, MD;  Location: [facility] CATH LAB;  Service: Cardiovascular;  Laterality: N/A;    There were no vitals filed for this visit.  Visit Diagnosis:  Abnormality of gait  Balance problems  Impaired functional mobility and activity tolerance  Weakness generalized  Activity intolerance  Encounter for prosthetic gait training      Subjective Assessment - 03/30/15 1104    Subjective No new complaints. No falls or pain to report. Does report being tired today due to not sleeping well last night.    Currently in Pain? No/denies   Pain Score 0-No pain             [facility] Adult PT Treatment/Exercise - 03/30/15 1105    Transfers   Sit to Stand 6: Modified independent (Device/Increase time);With upper extremity assist;With armrests;From chair/3-in-1   Stand to Sit 6: Modified independent (Device/Increase time);With upper extremity assist;With armrests;To chair/3-in-1   Ambulation/Gait   Ambulation/Gait Yes   Ambulation/Gait Assistance 6: Modified independent (Device/Increase time)    Ambulation Distance (Feet) 220 Feet  x1   Assistive device Rolling walker;Prosthesis   Gait Pattern Step-through pattern;Step-to pattern;Decreased stance time - left;Decreased step length - right;Decreased hip/knee flexion - left;Left circumduction;Left hip hike;Trunk flexed   Ambulation Surface Level;Indoor   Berg Balance Test   Sit to Stand Able to stand  independently using hands   Standing Unsupported Able to stand 2 minutes with supervision   Sitting with Back Unsupported but Feet Supported on Floor or Stool Able to sit safely and securely 2 minutes   Stand to Sit Controls descent by using hands   Transfers Able to transfer safely, definite need of hands   Standing Unsupported with Eyes Closed Able to stand 10 seconds with supervision   Standing Ubsupported with Feet Together Needs help to attain position but able to stand for 30 seconds with feet together   From Standing, Reach Forward with Outstretched Arm Can reach forward >12 cm safely (5")  5 inches   From Standing Position, Pick up Object from Floor Able to pick up shoe, needs supervision   From Standing Position, Turn to Look Behind Over each Shoulder Turn sideways only but maintains balance   Turn 360 Degrees Needs assistance while turning   Standing Unsupported, Alternately Place Feet on Step/Stool Needs assistance to keep from falling or unable to try   Standing Unsupported, One Foot in Front Needs help to step but can hold 15 seconds   Standing on One Leg Unable to try or needs assist to prevent fall   Total Score 29   Prosthetics   Current prosthetic wear tolerance (days/week)  daily   Current prosthetic wear tolerance (#hours/day)  most awake hours, drying as needed (~8-9 hours a day)   Residual limb condition  intact   Donning Prosthesis Supervision   Doffing Prosthesis Supervision            PT Short Term Goals - 03/03/15 1108    PT SHORT TERM GOAL #1   Title donnes prosthesis with new suspension correctly  independently. (Target Date: 03/03/2015)   Baseline MET 03/02/2015   Status Achieved   PT SHORT TERM GOAL #2   Title  demonstrates understanding of initial HEP  (Target Date: 03/03/2015)   Baseline MET 03/02/2015   Status Achieved   PT SHORT TERM GOAL #3   Title wears prosthesis >70% of awake hours >/= 6 days /wk.  (Target Date: 03/03/2015)   Baseline 03/03/15: wearing most awake hours (8-9 a day)   Status Achieved   PT SHORT TERM GOAL #4   Title ambulates 100' with rolling walker & prosthesis with cues only for deviations.  (Target Date: 03/03/2015)   Baseline MET 03/02/2015 He ambulates 140' with RW & prosthesis with cues only for deviations.    Status Achieved   PT SHORT TERM GOAL #5   Title stands 60sec without UE support with supervision.  (Target Date: 03/03/2015)   Baseline 03/03/15: pt able to stand for ~30 seconds before needed to suppport himself   Status Not Met   PT SHORT TERM GOAL #6   Title pt ambulates 84' with a cane along counter or table for contact with LUE with supervision.  (Target Date: 03/03/2015)   Baseline MET 03/02/2015   Status Achieved   PT SHORT TERM GOAL #7   Title pt negotiates ramps and curb with RW & prosthesis with supervision.  (Target Date: 03/03/2015)   Baseline met on 03/03/15   Time --   Period --   Status Achieved   PT SHORT TERM GOAL #8   Title pt able to transfer safely from [facility] to chair with armrests or car without RW with supervision.  (Target Date: 03/03/2015)   Baseline MET 03/02/2015   Status Achieved           PT Long Term Goals - 03/30/15 1107    PT LONG TERM GOAL #1   Title wears prosthesis >80% of awake hours daily.  (Target Date: 03/31/2015)   Baseline MET 03/28/2015   Time --   Period --   Status Achieved   PT LONG TERM GOAL #2   Title verbalize understanding of ongoing fitness plan.  (Target Date: 03/31/2015)   Baseline MET 03/28/2015   Time --   Period --   Status Achieved   PT LONG TERM GOAL #3   Title ambulates  250' with rolling walker & prosthesis modified independent.  (Target Date: 03/31/2015)   Baseline 03/30/15: pt is mod I with gait, unable to go further than 220 feet due to pain/fatigue.   Time --   Period --   Status Partially Met   PT LONG TERM GOAL #4   Title Berg Balance >/= 20/56  (Target Date: 03/31/2015)   Baseline Met on 03/30/15: scored 29/56 today.   Time --   Period --   Status Achieved   PT LONG TERM GOAL #5   Title negotiate ramp, curb & stairs (1 rail) with rolling walker & prosthesis modified independent.  (Target Date: 03/31/2015)   Baseline MET 03/28/2015   Time --   Period --   Status Achieved   PT LONG TERM GOAL #6   Title ambulates 20' with [facility] & prosthesis modified independent.  (Target Date: 03/31/2015)   Baseline MET 03/28/2015   Time --   Period --   Status Achieved           Plan - 03/30/15 1105    Clinical Impression Statement Pt met 1 of the 2 remaining LTGs today, progress made toward the unmet goal. Pt agreeable to discharge today.   Pt will benefit from skilled therapeutic intervention in order to improve on the following deficits Abnormal  gait;Decreased activity tolerance;Decreased balance;Decreased endurance;Decreased knowledge of use of DME;Decreased mobility;Decreased safety awareness;Decreased range of motion;Decreased strength;Impaired flexibility;Postural dysfunction;Prosthetic Dependency   Rehab Potential Good   PT Frequency 2x / week   PT Duration Other (comment)  9 weeks (60 days)   PT Treatment/Interventions ADLs/Self Care Home Management;DME Instruction;Gait training;Stair training;Functional mobility training;Therapeutic activities;Therapeutic exercise;Balance training;Neuromuscular re-education;Patient/family education;Prosthetic Training   PT Next Visit Plan discharge today per PT plan of care   Consulted and Agree with Plan of Care Patient          G-Codes - 04-09-15 1303    Functional Assessment Tool Used wearing prosthesis all  awake hours (9-10) and drying as needed. continues to need occasional cues to tighten strap and/or on sock management when prosthesis rotates during movement      Problem List Patient Active Problem List   Diagnosis Date Noted  . Phantom limb syndrome with pain ([place]) 08/03/2014  . Diabetic polyneuropathy associated with diabetes mellitus due to underlying condition ([place]) 07/12/2014  . CVA (cerebral infarction) 06/15/2014  . Expressive aphasia 06/15/2014  . CKD (chronic kidney disease) stage 4, GFR 15-29 ml/min (HCC) 06/15/2014  . DM (diabetes mellitus), type 2, uncontrolled, with renal complications ([place]) 62/22/9798  . Unilateral AKA ([place]) 02/09/2013  . S/P ICD (internal cardiac defibrillator) procedure 05/18/2012  . PAD (peripheral artery disease) ([place]) 09/04/2011  . CAD (coronary artery disease)   . Chronic systolic congestive heart failure ([place]) 07/06/2010  . Gout 09/18/2007  . Essential hypertension 01/23/2007  . Atrial fibrillation ([place]) 01/23/2007  . BPH (benign prostatic hyperplasia) 01/23/2007    Willow Ora 2015/04/09, 1:08 PM  Willow Ora, PTA, [facility] [address] [place], [place] [address] [phone] 04/09/2015, 1:08 PM   Name: [name] MRN: [id number] Date of Birth: Dec 28, 1934

## 2015-03-31 ENCOUNTER — Telehealth: Payer: Self-pay | Admitting: Family Medicine

## 2015-03-31 LAB — CUP PACEART INCLINIC DEVICE CHECK
Battery Remaining Longevity: 88 mo
Implantable Lead Implant Date: 20111024
Implantable Lead Location: 753858
Implantable Lead Location: 753860
Implantable Lead Model: 4196
Implantable Lead Model: 5076
Lead Channel Pacing Threshold Amplitude: 0.75 V
Lead Channel Pacing Threshold Amplitude: 1 V
Lead Channel Pacing Threshold Pulse Width: 0.4 ms
Lead Channel Pacing Threshold Pulse Width: 0.4 ms
Lead Channel Sensing Intrinsic Amplitude: 0.3 mV
Lead Channel Setting Pacing Amplitude: 2.5 V
Lead Channel Setting Pacing Pulse Width: 0.4 ms
Lead Channel Setting Pacing Pulse Width: 0.4 ms
Lead Channel Setting Sensing Sensitivity: 0.3 mV
MDC IDC LEAD IMPLANT DT: 20111024
MDC IDC LEAD IMPLANT DT: 20111024
MDC IDC LEAD LOCATION: 753859
MDC IDC MSMT LEADCHNL LV IMPEDANCE VALUE: 893 Ohm
MDC IDC MSMT LEADCHNL RA IMPEDANCE VALUE: 342 Ohm
MDC IDC MSMT LEADCHNL RV IMPEDANCE VALUE: 513 Ohm
MDC IDC SESS DTM: 20170127160921
MDC IDC SET LEADCHNL LV PACING AMPLITUDE: 2.5 V
MDC IDC STAT BRADY AP VP PERCENT: 0 %
MDC IDC STAT BRADY AP VS PERCENT: 0 %
MDC IDC STAT BRADY AS VP PERCENT: 99.7 %
MDC IDC STAT BRADY AS VS PERCENT: 0.3 %

## 2015-03-31 LAB — TOXASSURE SELECT,+ANTIDEPR,UR: PDF: 0

## 2015-03-31 NOTE — Telephone Encounter (Signed)
Pt call to give you this information. The name of the med is  [facility]

## 2015-03-31 NOTE — Telephone Encounter (Signed)
I spoke with pt and gave below information.  

## 2015-03-31 NOTE — Telephone Encounter (Signed)
I spoke with pt and he is going to check with insurance to see what might be covered.

## 2015-03-31 NOTE — Telephone Encounter (Signed)
There is no such medication. Generic Viagra is called Sildenafil

## 2015-04-04 ENCOUNTER — Encounter: Payer: Commercial Managed Care - HMO | Admitting: Physical Therapy

## 2015-04-04 NOTE — Progress Notes (Signed)
Urine drug screen for this encounter is inconsistent for prescribed medication. He reported taking his oxycodone 03/22/15 and there are no metabolites present on 03/23/15.  He is also positive for alcohol.  This is his 2nd  Positive test and was given a verbal warning with his last positive.

## 2015-04-06 ENCOUNTER — Encounter: Payer: Commercial Managed Care - HMO | Admitting: Physical Therapy

## 2015-04-10 ENCOUNTER — Ambulatory Visit (INDEPENDENT_AMBULATORY_CARE_PROVIDER_SITE_OTHER): Payer: PPO | Admitting: General Practice

## 2015-04-10 DIAGNOSIS — I482 Chronic atrial fibrillation, unspecified: Secondary | ICD-10-CM

## 2015-04-10 DIAGNOSIS — I4891 Unspecified atrial fibrillation: Secondary | ICD-10-CM

## 2015-04-10 LAB — POCT INR: INR: 2.4

## 2015-04-10 NOTE — Progress Notes (Signed)
Pre visit review using our clinic review tool, if applicable. No additional management support is needed unless otherwise documented below in the visit note. 

## 2015-04-11 ENCOUNTER — Encounter: Payer: Commercial Managed Care - HMO | Admitting: Physical Therapy

## 2015-04-11 ENCOUNTER — Encounter: Payer: Self-pay | Admitting: Physical Therapy

## 2015-04-11 NOTE — Therapy (Signed)
[facility] [address] [place], [place], [address] Phone: [phone]   Fax:  [phone]  Patient Details  Name: [name] MRN: [id number] Date of Birth: 07/18/1934 Referring Provider: Danella Sensing, NP Encounter Date: 04/11/2015   PHYSICAL THERAPY DISCHARGE SUMMARY  Visits from Start of Care: 13  Current functional level related to goals / functional outcomes:     PT Long Term Goals - 03/30/15 1107    PT LONG TERM GOAL #1   Title wears prosthesis >80% of awake hours daily.  (Target Date: 03/31/2015)   Baseline MET 03/28/2015   Time --   Period --   Status Achieved   PT LONG TERM GOAL #2   Title verbalize understanding of ongoing fitness plan.  (Target Date: 03/31/2015)   Baseline MET 03/28/2015   Time --   Period --   Status Achieved   PT LONG TERM GOAL #3   Title ambulates 250' with rolling walker & prosthesis modified independent.  (Target Date: 03/31/2015)   Baseline 03/30/15: pt is mod I with gait, unable to go further than 220 feet due to pain/fatigue.   Time --   Period --   Status Partially Met   PT LONG TERM GOAL #4   Title Berg Balance >/= 20/56  (Target Date: 03/31/2015)   Baseline Met on 03/30/15: scored 29/56 today.   Time --   Period --   Status Achieved   PT LONG TERM GOAL #5   Title negotiate ramp, curb & stairs (1 rail) with rolling walker & prosthesis modified independent.  (Target Date: 03/31/2015)   Baseline MET 03/28/2015   Time --   Period --   Status Achieved   PT LONG TERM GOAL #6   Title ambulates 20' with [facility] & prosthesis modified independent.  (Target Date: 03/31/2015)   Baseline MET 03/28/2015   Time --   Period --   Status Achieved       Remaining deficits: Patient has limited standing & gait tolerance due to circulation in non-amputated leg.  Balance deficits require use of RW for safety with gait.     Education / Equipment: HEP & prosthetic training.     Plan: Patient agrees to discharge.  Patient goals were met. Patient is being discharged due to meeting the stated rehab goals.  ?????       Porschia Willbanks PT, DPT 04/11/2015, 10:20 AM  [facility] [address] [place], [place], [address] Phone: [phone]   Fax:  [phone]

## 2015-04-12 ENCOUNTER — Telehealth: Payer: Self-pay | Admitting: *Deleted

## 2015-04-12 NOTE — Telephone Encounter (Signed)
Formal warning sent about consuming alcohol with narcotics

## 2015-04-13 ENCOUNTER — Encounter: Payer: Commercial Managed Care - HMO | Admitting: Physical Therapy

## 2015-04-17 ENCOUNTER — Telehealth: Payer: Self-pay | Admitting: Physical Medicine & Rehabilitation

## 2015-04-17 NOTE — Telephone Encounter (Signed)
Patient would like to speak with Zella Ball about the alcohol that was in his urine drug screen.  Please call him at [phone].

## 2015-04-18 ENCOUNTER — Telehealth: Payer: Self-pay

## 2015-04-18 ENCOUNTER — Ambulatory Visit (INDEPENDENT_AMBULATORY_CARE_PROVIDER_SITE_OTHER): Payer: PPO

## 2015-04-18 ENCOUNTER — Telehealth: Payer: Self-pay | Admitting: *Deleted

## 2015-04-18 ENCOUNTER — Encounter: Payer: Commercial Managed Care - HMO | Admitting: Physical Therapy

## 2015-04-18 DIAGNOSIS — I5022 Chronic systolic (congestive) heart failure: Secondary | ICD-10-CM | POA: Diagnosis not present

## 2015-04-18 DIAGNOSIS — Z95810 Presence of automatic (implantable) cardiac defibrillator: Secondary | ICD-10-CM | POA: Diagnosis not present

## 2015-04-18 NOTE — Progress Notes (Signed)
EPIC Encounter for ICM Monitoring  Patient Name: [name] is a 81 y.o. male Date: 04/18/2015 Primary Care Physican: Laurey Morale, MD Primary Cardiologist: Tamala Julian Electrophysiologist: Allred Dry Weight:  Unable to weigh       In the past month, have you:  1. Gained more than 2 pounds in a day or more than 5 pounds in a week? Unknown  2. Had changes in your medications (with verification of current medications)? no  3. Had more shortness of breath than is usual for you? no  4. Limited your activity because of shortness of breath? no  5. Not been able to sleep because of shortness of breath? no  6. Had increased swelling in your feet or ankles? no  7. Had symptoms of dehydration (dizziness, dry mouth, increased thirst, decreased urine output) no  8. Had changes in sodium restriction? no  9. Been compliant with medication? Yes   ICM trend: 3 month view    ICM trend: 1 year view   Follow-up plan: ICM clinic phone appointment 05/19/2015.  Optivol thoracic impedance below reference line 03/15/2015 to 04/05/2015 and 04/10/2015 to 04/17/2015 suggesting fluid retention.  Thoracic impedance returned to reference line on 04/18/2015.  He denied any fluid symptoms during those times.  He called this morning to report he thought he had a whistling sound coming from his device but denied any symptoms such as chest pain, shortness of breath or dizziness.  Reviewed transmission with [facility], device team and no episodes noted.  Explained any audible sounds from the device would be a flat tone that would occur daily if the battery is low and a siren type sound would be heard every 4-6 hours indicating there is an alert such as a lead problem.  He denied hearing either one of those sounds and said it was more of a whistle.  Advised to call back should he have any symptoms or hears more sounds from the device.  No changes today.    Copy of note sent to patient's primary care physician,  primary cardiologist, and device following physician.  Rosalene Billings, RN, CCM 04/18/2015 9:36 AM

## 2015-04-18 NOTE — Telephone Encounter (Signed)
Wants to speak with [name]. LVM to let him know she will be back in office 04/18/14

## 2015-04-18 NOTE — Telephone Encounter (Signed)
Call back to patient. He reported he thought he heard a whistling from device.  He denied any symptoms.  Requested he send a manual transmission for review.    Received voice mail from patient requesting a call back.  He stated he thought his device was whistling.

## 2015-04-19 NOTE — Telephone Encounter (Signed)
I return [name] call. He admits he had a glass of wine on Christmas and his birthday. Educated on the the Narcotic Policy and he verbalizes understanding.

## 2015-04-20 ENCOUNTER — Encounter: Payer: Commercial Managed Care - HMO | Admitting: Physical Therapy

## 2015-04-25 ENCOUNTER — Encounter: Payer: Commercial Managed Care - HMO | Admitting: Physical Therapy

## 2015-05-03 ENCOUNTER — Telehealth: Payer: Self-pay | Admitting: Family Medicine

## 2015-05-03 ENCOUNTER — Other Ambulatory Visit: Payer: Self-pay | Admitting: General Practice

## 2015-05-03 DIAGNOSIS — I482 Chronic atrial fibrillation, unspecified: Secondary | ICD-10-CM

## 2015-05-03 MED ORDER — GABAPENTIN 300 MG PO CAPS: 300.0000 mg | ORAL_CAPSULE | Freq: Three times a day (TID) | ORAL | Status: DC

## 2015-05-03 MED ORDER — CARVEDILOL 25 MG PO TABS: 25.0000 mg | ORAL_TABLET | Freq: Two times a day (BID) | ORAL | Status: DC

## 2015-05-03 MED ORDER — WARFARIN SODIUM 5 MG PO TABS: ORAL_TABLET | ORAL | Status: DC

## 2015-05-03 MED ORDER — ALLOPURINOL 100 MG PO TABS: 100.0000 mg | ORAL_TABLET | Freq: Every day | ORAL | Status: DC | PRN

## 2015-05-03 MED ORDER — FINASTERIDE 5 MG PO TABS: 5.0000 mg | ORAL_TABLET | Freq: Every day | ORAL | Status: DC

## 2015-05-03 MED ORDER — AMLODIPINE BESYLATE 5 MG PO TABS: 5.0000 mg | ORAL_TABLET | Freq: Every day | ORAL | Status: DC

## 2015-05-03 MED ORDER — LOSARTAN POTASSIUM 100 MG PO TABS: 100.0000 mg | ORAL_TABLET | Freq: Every day | ORAL | Status: DC

## 2015-05-03 NOTE — Telephone Encounter (Signed)
Patient called yesterday and wants to make sure he gets a call back.  Also, He wants the following medications sent to [facility]:  Cardilol 25 mgs Finastride 5 mgs Amlopine 5 mgs Warfarin 5 mgs Allopurinol 100 mgs Losartan 100 mgs Gabapentin 300 mgs

## 2015-05-03 NOTE — Telephone Encounter (Signed)
Re-fill for warfarin sent today, 3/1.

## 2015-05-03 NOTE — Telephone Encounter (Signed)
Actually it was 6 scripts. Pt still needs script for Warfarin, going to forward this request to Coumadin clinic.

## 2015-05-03 NOTE — Telephone Encounter (Signed)
I sent 5 scripts e-scribe to below pharmacy.

## 2015-05-03 NOTE — Telephone Encounter (Signed)
Sent pt a mychart message. 

## 2015-05-09 ENCOUNTER — Encounter: Payer: Self-pay | Admitting: Physical Medicine & Rehabilitation

## 2015-05-09 ENCOUNTER — Encounter: Payer: PPO | Attending: Physical Medicine & Rehabilitation | Admitting: Physical Medicine & Rehabilitation

## 2015-05-09 VITALS — BP 160/67 | HR 63

## 2015-05-09 DIAGNOSIS — S78112A Complete traumatic amputation at level between left hip and knee, initial encounter: Secondary | ICD-10-CM

## 2015-05-09 DIAGNOSIS — M79672 Pain in left foot: Secondary | ICD-10-CM | POA: Insufficient documentation

## 2015-05-09 DIAGNOSIS — G546 Phantom limb syndrome with pain: Secondary | ICD-10-CM | POA: Diagnosis not present

## 2015-05-09 DIAGNOSIS — Z89612 Acquired absence of left leg above knee: Secondary | ICD-10-CM

## 2015-05-09 MED ORDER — OXYCODONE-ACETAMINOPHEN 10-325 MG PO TABS: 1.0000 | ORAL_TABLET | ORAL | Status: DC | PRN

## 2015-05-09 NOTE — Progress Notes (Signed)
Subjective:    Patient ID: [name], male    DOB: 08/13/1934, 81 y.o.   MRN: [id number]  HPI  Mr. Jonathan, Campos here in follow up of his left AKA and associated phantom pain. He went to therapy for 11-12 sessions. All his goals were met. He still complains of fatigue at home when he walks short distances. He sees cardiology later this week apparently.   His phantom limb pain remains a major problem. He hasn't seen a big change since being more ambulatory with his prosthesis. He continues on medications as prescribed. Nothing really helps the pain, although the percocet helps take the edge off.     Pain Inventory Average Pain 7 Pain Right Now 8 My pain is sharp and stabbing  In the last 24 hours, has pain interfered with the following? General activity 4 Relation with others 6 Enjoyment of life 5 What TIME of day is your pain at its worst? night Sleep (in general) Fair  Pain is worse with: inactivity Pain improves with: therapy/exercise and medication Relief from Meds: na  Mobility use a cane use a walker ability to climb steps?  yes do you drive?  yes  Function retired  Neuro/Psych No problems in this area  Prior Studies Any changes since last visit?  no  Physicians involved in your care Any changes since last visit?  no   Family History  Problem Relation Age of Onset  . Cancer      breast/fhx  . Heart disease      fhx  . Diabetes Neg Hx   . Cancer Father    Social History   Social History  . Marital Status: Widowed    Spouse Name: N/A  . Number of Children: N/A  . Years of Education: N/A   Occupational History  . Retired    Social History Main Topics  . Smoking status: Current Some Day Smoker    Types: Cigars  . Smokeless tobacco: Never Used     Comment: 1 cigar each month  . Alcohol Use: 0.0 oz/week    0 Standard drinks or equivalent per week     Comment: once a week wine  . Drug Use: No  . Sexual Activity: Not Currently   Other  Topics Concern  . None   Social History Narrative   Lives in [place] with significant other,  [name].   Past Surgical History  Procedure Laterality Date  . Turp vaporization    . Cardiac defibrillator placement  12/26/09    pacemaker combo  . Cervical epidural injection  2013  . Femoral-tibial bypass graft  09/25/2011    Procedure: BYPASS GRAFT FEMORAL-TIBIAL ARTERY;  Surgeon: Mal Misty, MD;  Location: [facility] OR;  Service: Vascular;  Laterality: Left;  Left Femoral - Anterior Tibial Bypass;  saphenous vein graft from left leg  . Intraoperative arteriogram  09/25/2011    Procedure: INTRA OPERATIVE ARTERIOGRAM;  Surgeon: Mal Misty, MD;  Location: [place];  Service: Vascular;  Laterality: Left;  . Femoral-tibial bypass graft  02/07/2012    Procedure: BYPASS GRAFT FEMORAL-TIBIAL ARTERY;  Surgeon: Rosetta Posner, MD;  Location: [place];  Service: Vascular;  Laterality: Left;  Thrombectomy Left Femoral - Tibial Bypass Graft  . Embolectomy  02/07/2012    Procedure: EMBOLECTOMY;  Surgeon: Rosetta Posner, MD;  Location: [place];  Service: Vascular;  Laterality: Left;  . Femoral-tibial bypass graft  04/03/2012    Procedure: BYPASS GRAFT FEMORAL-TIBIAL ARTERY;  Surgeon:  Mal Misty, MD;  Location: [place];  Service: Vascular;  Laterality: Left;  Redo  . Insert / replace / remove pacemaker  2007  . Coronary angioplasty with stent placement  ~ 2000  . Coronary angioplasty    . Uvulopalatopharyngoplasty (uppp)/tonsillectomy/septoplasty  06/30/2003    Archie Endo 06/30/2003 (07/10/2012)  . Shoulder open rotator cuff repair Right 2001    repair of lacerated right/notes 10/11/1999  (07/10/2012)  . Foot surgery Right 03/20/2001    "have plates and screws in; didn't break it" (07/10/2012)  . Carpal tunnel release Right 2002    Archie Endo 03/20/2001 (07/10/2012)  . Biceps tendon repair Right 2001    Archie Endo 03/20/2001 (07/10/2012)  . Renal artery stent  2009  . Femoral-popliteal bypass graft Left 09/16/2012    Procedure: LEFT  FEMORAL-POPLITEAL BYPASS GRAFT WITH GORTEX Propaten Graft 6x80 Thin Wall and Left lower leg Angiogram;  Surgeon: Mal Misty, MD;  Location: [place];  Service: Vascular;  Laterality: Left;  . Colonoscopy      Hx; of  . Tonsillectomy    . Adenoidectomy      Hx; of   . Femoral-tibial bypass graft Left 09/30/2012    Procedure: REDO LEFT FEMORAL-ANTERIOR TIBIAL ARTERY BYPASS USING COMPOSITE CEPHALIC AND BASILIC VEIN GRAFT FROM LEFT ARM;  Surgeon: Mal Misty, MD;  Location: [place];  Service: Vascular;  Laterality: Left;  . I&d extremity Left 10/21/2012    Procedure: EXPLORATION AND DEBRIDEMENT OF LEFT GROIN WOUND;  Surgeon: Mal Misty, MD;  Location: [place];  Service: Vascular;  Laterality: Left;  . Patch angioplasty Left 10/21/2012    Procedure: PATCH ANGIOPLASTY;  Surgeon: Mal Misty, MD;  Location: [place];  Service: Vascular;  Laterality: Left;  . Groin debridement Left 11/12/2012    Procedure: CLOSURE INGUINAL WOUND;  Surgeon: Mal Misty, MD;  Location: [place];  Service: Vascular;  Laterality: Left;  . Embolectomy Left 12/16/2012    Procedure: THROMBECTOMY  LEFT LEG BYPASS;  Surgeon: Elam Dutch, MD;  Location: [place];  Service: Vascular;  Laterality: Left;  . Leg amputation above knee Left 02/04/2013    DR LAWSON  . Amputation Left 02/04/2013    Procedure: AMPUTATION ABOVE KNEE-LEFT;  Surgeon: Mal Misty, MD;  Location: [place];  Service: Vascular;  Laterality: Left;  . Removal of graft Left 04/16/2013    Procedure: I & D LEFT AKA WOUND, POSSIBLE REMOVAL OF INFECTED GORTEX GRAFT;  Surgeon: Mal Misty, MD;  Location: [place];  Service: Vascular;  Laterality: Left;  . Abdominal aortagram N/A 09/11/2011    Procedure: ABDOMINAL Maxcine Ham;  Surgeon: Wellington Hampshire, MD;  Location: [facility] CATH LAB;  Service: Cardiovascular;  Laterality: N/A;  . Lower extremity angiogram Left 12/25/2011    Procedure: LOWER EXTREMITY ANGIOGRAM;  Surgeon: Serafina Mitchell, MD;  Location: [facility] CATH LAB;   Service: Cardiovascular;  Laterality: Left;  lt leg angio co2  . Abdominal angiogram  12/25/2011    Procedure: ABDOMINAL ANGIOGRAM;  Surgeon: Serafina Mitchell, MD;  Location: [facility] CATH LAB;  Service: Cardiovascular;;  . Lower extremity angiogram N/A 07/07/2012    Procedure: LOWER EXTREMITY ANGIOGRAM;  Surgeon: Conrad Okanogan, MD;  Location: [facility] CATH LAB;  Service: Cardiovascular;  Laterality: N/A;  . Lower extremity angiogram N/A 09/15/2012    Procedure: LOWER EXTREMITY ANGIOGRAM;  Surgeon: Serafina Mitchell, MD;  Location: [facility] C[facility] LAB;  Service: Cardiovascular;  Laterality: N/A;  . Upper extremity angiogram  09/15/2012  Procedure: UPPER EXTREMITY ANGIOGRAM;  Surgeon: Serafina Mitchell, MD;  Location: [facility] CATH LAB;  Service: Cardiovascular;;  . Biv icd genertaor change out N/A 11/09/2013    Procedure: BIV ICD GENERTAOR CHANGE OUT;  Surgeon: Coralyn Mark, MD;  Location: [facility] CATH LAB;  Service: Cardiovascular;  Laterality: N/A;   Past Medical History  Diagnosis Date  . Gout   . Benign prostatic hypertrophy     takes Proscar daily  . Atrial fibrillation ([place])     takes Warfarin daily  . Chronic systolic dysfunction of left ventricle     a. mixed ischemic and nonischemic CM,  EF 35%. b. s/p AICD implantation.  . ED (erectile dysfunction)   . Arthritis   . Pacemaker     medtronic  . CAD (coronary artery disease)     a. s/p mid LAD stenting with DES 2008 Dr. Daneen Schick  . ICD (implantable cardiac defibrillator) in place     medtronic, Dr. Rayann Heman  . ICD (implantable cardiac defibrillator) in place   . Sleep apnea     hx of "had surgery for"  . Automatic implantable cardioverter-defibrillator in situ   . Depression   . Numbness and tingling     Hx; 60fleft foot  . PAD (peripheral artery disease) (HCC)     Severe by PV angiogram 09/2011  . [place]     s/p stenting 2009  . PAD (peripheral artery disease) (HCC)     s/p multiple LLE bypass grafts; left mid-distal SCA occlusion by  08/2012 duplex  . Hypertension     takes Carvedilol and Losartan daily  . Constipation     takes Miralax daily as needed and Colace daily   . Type II diabetes mellitus (HCC)     takes Novolog 70/30  . CHF (congestive heart failure) (HCC)     takes Lasix daily  . History of MRSA infection   . Insomnia     TAKES TRAZODONE NIGHTLY   BP 160/67 mmHg  Pulse 63  SpO2 100%  Opioid Risk Score:   Fall Risk Score:  `1  Depression screen PHQ 2/9  [facility] 05/09/2015 11/23/2014 06/03/2014  Decreased Interest '2 2 2  ' Down, Depressed, Hopeless '1 1 1  ' PHQ - 2 Score '3 3 3  ' Altered sleeping - - 3  Tired, decreased energy - - 2  Change in appetite - - 0  Feeling bad or failure about yourself  - - 0  Trouble concentrating - - 1  Moving slowly or fidgety/restless - - 0  Suicidal thoughts - - 0  PHQ-9 Score - - 9     Review of Systems  Respiratory: Positive for wheezing.   Cardiovascular: Positive for leg swelling.  All other systems reviewed and are negative.      Objective:   Physical Exam  Constitutional: He is oriented to person, place, and time. He appears well-developed and well-nourished.  HENT:  Head: Normocephalic and atraumatic.  Eyes: Conjunctivae are normal. Pupils are equal, round, and reactive to light.  Neck: Neck supple.  Cardiovascular: Normal rate and regular rhythm. No rubs, gallops. Pacer site dressed  No murmur heard.  Respiratory: Effort normal and breath sounds normal. No respiratory distress. He has no wheezes.  GI: Soft. Bowel sounds are normal. He exhibits no distension. There is no tenderness.  Musculoskeletal:  L-AKA prosthesis socket in place and fitting appropriately Neurological: He is alert and oriented to person, place, and time.  Follows commands. Strength grossly intact in UE's at 5/5. RLE is 4/5 prox to 5/5 distally. Left HF 4/5. mild sensory loss in distal right leg/foot.     Skin: Intact.  Psychiatric: He has a normal mood and  affect. His speech is normal and behavior is normal. Judgment and thought content normal. Cognition and memory are normal.      Assessment/Plan:  1. Functional deficits secondary to left AKA.  -continue gait at home to improve stamina/balance   2. Phantom limb pain---  -continue gabapentin 313m tid  -continue percocet to 10/325 to help with current pain and with prosthetic training. #60, 2nd RF for next month  -consider sympathetic ganglion block for LLE --Dr. BEduard Closwill decide -discussed ongoing prosthesis wear and visual therapy as well 3. Follow up with me in about 2 months. 15 minutes of face to face patient care time were spent during this visit. All questions were encouraged and answered.  4. Fatigue: it may be as simple as deconditioning and the additonal energy required to power his right AKA. He should speak with cardiology however at his follow up visit to determine if any cardiac work up is required.

## 2015-05-09 NOTE — Patient Instructions (Signed)
CONSIDER SYMPATHETIC GANGLION BLOCK FOR LEFT PHANTOM PAIN----LET ME KNOW IF YOU WANT A REFERRAL.   DISCUSS FATIGUE WITH YOUR CARDIOLOGIST.   PLEASE CALL ME WITH ANY PROBLEMS OR QUESTIONS [id number]).

## 2015-05-12 ENCOUNTER — Ambulatory Visit (INDEPENDENT_AMBULATORY_CARE_PROVIDER_SITE_OTHER): Payer: PPO | Admitting: Interventional Cardiology

## 2015-05-12 ENCOUNTER — Encounter: Payer: Self-pay | Admitting: Interventional Cardiology

## 2015-05-12 VITALS — BP 136/74 | HR 68 | Ht 68.0 in | Wt 183.0 lb

## 2015-05-12 DIAGNOSIS — I5022 Chronic systolic (congestive) heart failure: Secondary | ICD-10-CM

## 2015-05-12 DIAGNOSIS — I482 Chronic atrial fibrillation, unspecified: Secondary | ICD-10-CM

## 2015-05-12 DIAGNOSIS — I25718 Atherosclerosis of autologous vein coronary artery bypass graft(s) with other forms of angina pectoris: Secondary | ICD-10-CM

## 2015-05-12 DIAGNOSIS — Z95810 Presence of automatic (implantable) cardiac defibrillator: Secondary | ICD-10-CM

## 2015-05-12 DIAGNOSIS — Z7901 Long term (current) use of anticoagulants: Secondary | ICD-10-CM

## 2015-05-12 DIAGNOSIS — I739 Peripheral vascular disease, unspecified: Secondary | ICD-10-CM

## 2015-05-12 NOTE — Progress Notes (Signed)
Cardiology Office Note   Date:  05/12/2015   ID:  [name], DOB 07-07-1934, MRN [id number]  PCP:  Laurey Morale, MD  Cardiologist:  Sinclair Grooms, MD   Chief Complaint  Patient presents with  . Coronary Artery Disease      History of Present Illness: [name] is a 81 y.o. male who presents for Atrial fibrillation, AICD, chronic ischemic heart disease with prior LAD stenting, severe peripheral vascular disease with right leg claudication and 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  [name] is doing well. He smells of cigarette smoke. He denies orthopnea. He does state that there is an occasional wheeze. No phlegm production or hemoptysis is been noted. His appetite is been stable. There is no right lower extremity edema. Left lower extremity is prosthetic secondary to amputation from vascular insufficiency. No recent use of nitroglycerin or symptoms that he considers to represent angina. He has been compliant with his medical regimen.    Past Medical History  Diagnosis Date  . Gout   . Benign prostatic hypertrophy     takes Proscar daily  . Atrial fibrillation ([place])     takes Warfarin daily  . Chronic systolic dysfunction of left ventricle     a. mixed ischemic and nonischemic CM,  EF 35%. b. s/p AICD implantation.  . ED (erectile dysfunction)   . Arthritis   . Pacemaker     medtronic  . CAD (coronary artery disease)     a. s/p mid LAD stenting with DES 2008 Dr. Daneen Schick  . ICD (implantable cardiac defibrillator) in place     medtronic, Dr. Rayann Heman  . ICD (implantable cardiac defibrillator) in place   . Sleep apnea     hx of "had surgery for"  . Automatic implantable cardioverter-defibrillator in situ   . Depression   . Numbness and tingling     Hx; 66f left foot  . PAD (peripheral artery disease) (HCC)     Severe by PV angiogram 09/2011  . Renal artery stenosis ([place])     s/p stenting 2009  . PAD (peripheral  artery disease) (HCC)     s/p multiple LLE bypass grafts; left mid-distal SCA occlusion by 08/2012 duplex  . Hypertension     takes Carvedilol and Losartan daily  . Constipation     takes Miralax daily as needed and Colace daily   . Type II diabetes mellitus (HCC)     takes Novolog 70/30  . CHF (congestive heart failure) (HCC)     takes Lasix daily  . History of MRSA infection   . Insomnia     TAKES TRAZODONE NIGHTLY    Past Surgical History  Procedure Laterality Date  . Turp vaporization    . Cardiac defibrillator placement  12/26/09    pacemaker combo  . Cervical epidural injection  2013  . Femoral-tibial bypass graft  09/25/2011    Procedure: BYPASS GRAFT FEMORAL-TIBIAL ARTERY;  Surgeon: Mal Misty, MD;  [facility] OR;  Service: Vascular;  Laterality: Left;  Left Femoral - Anterior Tibial Bypass;  saphenous vein graft from left leg  . Intraoperative arteriogram  09/25/2011    Procedure: INTRA OPERATIVE ARTERIOGRAM;  Surgeon: Mal Misty, MD;  Location: [place];  Service: Vascular;  Laterality: Left;  . Femoral-tibial bypass graft  02/07/2012    Procedure: BYPASS GRAFT FEMORAL-TIBIAL ARTERY;  Surgeon: Rosetta Posner, MD;  Location: [place];  Service: Vascular;  Laterality: Left;  Thrombectomy Left Femoral - Tibial Bypass Graft  . Embolectomy  02/07/2012    Procedure: EMBOLECTOMY;  Surgeon: Rosetta Posner, MD;  Location: [place];  Service: Vascular;  Laterality: Left;  . Femoral-tibial bypass graft  04/03/2012    Procedure: BYPASS GRAFT FEMORAL-TIBIAL ARTERY;  Surgeon: Mal Misty, MD;  Location: [place];  Service: Vascular;  Laterality: Left;  Redo  . Insert / replace / remove pacemaker  2007  . Coronary angioplasty with stent placement  ~ 2000  . Coronary angioplasty    . Uvulopalatopharyngoplasty (uppp)/tonsillectomy/septoplasty  06/30/2003    Archie Endo 06/30/2003 (07/10/2012)  . Shoulder open rotator cuff repair Right 2001    repair of lacerated right/notes 10/11/1999  (07/10/2012)  . Foot  surgery Right 03/20/2001    "have plates and screws in; didn't break it" (07/10/2012)  . Carpal tunnel release Right 2002    Archie Endo 03/20/2001 (07/10/2012)  . Biceps tendon repair Right 2001    Archie Endo 03/20/2001 (07/10/2012)  . Renal artery stent  2009  . Femoral-popliteal bypass graft Left 09/16/2012    Procedure: LEFT FEMORAL-POPLITEAL BYPASS GRAFT WITH GORTEX Propaten Graft 6x80 Thin Wall and Left lower leg Angiogram;  Surgeon: Mal Misty, MD;  Location: [place];  Service: Vascular;  Laterality: Left;  . Colonoscopy      Hx; of  . Tonsillectomy    . Adenoidectomy      Hx; of   . Femoral-tibial bypass graft Left 09/30/2012    Procedure: REDO LEFT FEMORAL-ANTERIOR TIBIAL ARTERY BYPASS USING COMPOSITE CEPHALIC AND BASILIC VEIN GRAFT FROM LEFT ARM;  Surgeon: Mal Misty, MD;  Location: [place];  Service: Vascular;  Laterality: Left;  . I&d extremity Left 10/21/2012    Procedure: EXPLORATION AND DEBRIDEMENT OF LEFT GROIN WOUND;  Surgeon: Mal Misty, MD;  Location: [place];  Service: Vascular;  Laterality: Left;  . Patch angioplasty Left 10/21/2012    Procedure: PATCH ANGIOPLASTY;  Surgeon: Mal Misty, MD;  Location: [place];  Service: Vascular;  Laterality: Left;  . Groin debridement Left 11/12/2012    Procedure: CLOSURE INGUINAL WOUND;  Surgeon: Mal Misty, MD;  Location: [place];  Service: Vascular;  Laterality: Left;  . Embolectomy Left 12/16/2012    Procedure: THROMBECTOMY  LEFT LEG BYPASS;  Surgeon: Elam Dutch, MD;  Location: [place];  Service: Vascular;  Laterality: Left;  . Leg amputation above knee Left 02/04/2013    DR LAWSON  . Amputation Left 02/04/2013    Procedure: AMPUTATION ABOVE KNEE-LEFT;  Surgeon: Mal Misty, MD;  Location: [place];  Service: Vascular;  Laterality: Left;  . Removal of graft Left 04/16/2013    Procedure: I & D LEFT AKA WOUND, POSSIBLE REMOVAL OF INFECTED GORTEX GRAFT;  Surgeon: Mal Misty, MD;  Location: [place];  Service: Vascular;  Laterality: Left;  .  Abdominal aortagram N/A 09/11/2011    Procedure: ABDOMINAL Maxcine Ham;  Surgeon: Wellington Hampshire, MD;  Location: [facility] CATH LAB;  Service: Cardiovascular;  Laterality: N/A;  . Lower extremity angiogram Left 12/25/2011    Procedure: LOWER EXTREMITY ANGIOGRAM;  Surgeon: Serafina Mitchell, MD;  Location: [facility] CATH LAB;  Service: Cardiovascular;  Laterality: Left;  lt leg angio co2  . Abdominal angiogram  12/25/2011    Procedure: ABDOMINAL ANGIOGRAM;  Surgeon: Serafina Mitchell, MD;  Location: [facility] CATH LAB;  Service: Cardiovascular;;  . Lower extremity angiogram N/A 07/07/2012    Procedure: LOWER EXTREMITY ANGIOGRAM;  Surgeon: Conrad [place], MD;  Location: [place] CATH LAB;  Service: Cardiovascular;  Laterality: N/A;  . Lower extremity angiogram N/A 09/15/2012    Procedure: LOWER EXTREMITY ANGIOGRAM;  Surgeon: Serafina Mitchell, MD;  Location: [place] CATH LAB;  Service: Cardiovascular;  Laterality: N/A;  . Upper extremity angiogram  09/15/2012    Procedure: UPPER EXTREMITY ANGIOGRAM;  Surgeon: Serafina Mitchell, MD;  Location: [facility] CATH LAB;  Service: Cardiovascular;;  . Biv icd genertaor change out N/A 11/09/2013    Procedure: BIV ICD GENERTAOR CHANGE OUT;  Surgeon: Coralyn Mark, MD;  Location: [facility] CATH LAB;  Service: Cardiovascular;  Laterality: N/A;     Current Outpatient Prescriptions  Medication Sig Dispense Refill  . allopurinol (ZYLOPRIM) 100 MG tablet Take 1 tablet (100 mg total) by mouth daily as needed (Gout). 90 tablet 3  . amLODipine (NORVASC) 5 MG tablet Take 1 tablet (5 mg total) by mouth daily. 90 tablet 3  . carvedilol (COREG) 25 MG tablet Take 1 tablet (25 mg total) by mouth 2 (two) times daily with a meal. 180 tablet 3  . finasteride (PROSCAR) 5 MG tablet Take 1 tablet (5 mg total) by mouth daily. 90 tablet 3  . furosemide (LASIX) 40 MG tablet Take 20 mg by mouth daily.     Marland Kitchen gabapentin (NEURONTIN) 300 MG capsule Take 1 capsule (300 mg total) by mouth 3 (three) times daily. 270 capsule 3  . glucose blood (ONE  TOUCH ULTRA TEST) test strip use to test blood sugar two times daily as instructed by physician. 100 each 1  . insulin aspart protamine- aspart (NOVOLOG MIX 70/30) (70-30) 100 UNIT/ML injection Inject 7 Units into the skin 2 (two) times daily with a meal.    . Insulin Pen Needle 31G X 8 MM MISC Use 2-3 times per day and diagnosis code is 250.00 100 each 1  . losartan (COZAAR) 100 MG tablet Take 1 tablet (100 mg total) by mouth daily. 90 tablet 3  . nitroGLYCERIN (NITROSTAT) 0.4 MG SL tablet Place 1 tablet (0.4 mg total) under the tongue every 5 (five) minutes as needed for chest pain. For chest pain, max 3 doses 90 tablet 3  . oxyCODONE-acetaminophen (PERCOCET) 10-325 MG tablet Take 1 tablet by mouth every 4 (four) hours as needed for pain. 60 tablet 0  . sildenafil (VIAGRA) 100 MG tablet Take as directed 5 tablet 0  . warfarin (COUMADIN) 5 MG tablet TAKE AS DIRECTED BY ANTICOAGULATION CLINIC 60 tablet 1   No current facility-administered medications for this visit.    Allergies:   Other and Bactrim ds    Social History:  The patient  reports that he has been smoking Cigars.  He has never used smokeless tobacco. He reports that he drinks alcohol. He reports that he does not use illicit drugs.   Family History:  The patient's family history includes Cancer in his father. There is no history of Diabetes.    ROS:  Please see the history of present illness.   Otherwise, review of systems are positive for concerned about erectile dysfunction and phantom pain of the left lower extremity. Also concerned about poor diabetes control..   All other systems are reviewed and negative.    PHYSICAL EXAM: VS:  BP 136/74 mmHg  Pulse 68  Ht 5\' 8"  (1.727 m)  Wt 183 lb (83.008 kg)  BMI 27.83 kg/m2 , BMI Body mass index is 27.83 kg/(m^2). GEN: Well nourished, well developed, in no acute distress. Chronically ill appearing. HEENT: normal Neck: no  JVD, carotid bruits, or masses Cardiac: RRR.  There is no  murmur, rub, or gallop. There is a prosthetic above the knee left lower extremity and there is no edema in the right lower extremity.  Respiratory:  clear to auscultation bilaterally, normal work of breathing. GI: soft, nontender, nondistended, + BS MS: no deformity or atrophy Skin: warm and dry, no rash Neuro:  Strength and sensation are intact Psych: euthymic mood, full affect   EKG:  EKG is not ordered today.   Recent Labs: 06/15/2014: ALT 24; Hemoglobin 15.0; Platelets 147* 07/12/2014: BUN 34*; Creatinine, Ser 1.87*; Potassium 5.0; Sodium 138    Lipid Panel    Component Value Date/Time   CHOL 230* 06/16/2014 0506   TRIG 204* 06/16/2014 0506   HDL 35* 06/16/2014 0506   CHOLHDL 6.6 06/16/2014 0506   VLDL 41* 06/16/2014 0506   LDLCALC 154* 06/16/2014 0506   LDLDIRECT 200.5 05/06/2012 1125      Wt Readings from Last 3 Encounters:  05/12/15 183 lb (83.008 kg)  03/24/15 182 lb (82.555 kg)  06/15/14 171 lb 1.2 oz (77.6 kg)      Other studies Reviewed: Additional studies/ records that were reviewed today include: Reviewed electronic health records. The findings include none.    ASSESSMENT AND PLAN:  1. Chronic systolic congestive heart failure (HCC) Evidence of volume overload  2. ICD (implantable cardioverter-defibrillator) in place Followed in device clinic  3. Atherosclerosis of autologous vein coronary artery bypass graft with other forms of angina pectoris ([place]) No angina  4. Chronic atrial fibrillation (HCC) Asymptomatic  5. Long term (current) use of anticoagulants No bleeding on Coumadin  6. Peripheral vascular disease ([place]) Left above-the-knee amputation. Phantom pain is present. Now on a scooter and difficult to tell of his right lower extremity claudication.    Current medicines are reviewed at length with the patient today.  The patient has the following concerns regarding medicines: None.  The following changes/actions have been instituted:      Encouraged rehabilitation on left lower extremity  Smoking cessation is encouraged  Encourage notification of angina or dyspnea  Labs/ tests ordered today include:  No orders of the defined types were placed in this encounter.     Disposition:   FU with HS in 9 months  Signed, Sinclair Grooms, MD  05/12/2015 10:17 AM    [address], [place], [place]  [id number] Phone: [phone]; Fax: [phone]

## 2015-05-12 NOTE — Patient Instructions (Signed)
Medication Instructions:  Your physician recommends that you continue on your current medications as directed. Please refer to the Current Medication list given to you today.   Labwork: None ordered  Testing/Procedures: None ordered  Follow-Up: Your physician wants you to follow-up in: [place] [facility].  You will receive a reminder letter in the mail two months in advance. If you don't receive a letter, please call our office to schedule the follow-up appointment.   Any Other Special Instructions Will Be Listed Below (If Applicable).     If you need a refill on your cardiac medications before your next appointment, please call your pharmacy.

## 2015-05-15 ENCOUNTER — Telehealth: Payer: Self-pay | Admitting: Family Medicine

## 2015-05-15 NOTE — Telephone Encounter (Signed)
Pt would like free dm meter. Pt will like to pick up today by 12 noon

## 2015-05-15 NOTE — Telephone Encounter (Signed)
Script is ready for pick up.

## 2015-05-15 NOTE — Telephone Encounter (Signed)
Pt had a one touch meter. Pt will be here about 12:30 to pu rx for a one touch meter/ok per sylvia

## 2015-05-16 ENCOUNTER — Other Ambulatory Visit: Payer: Self-pay | Admitting: Family Medicine

## 2015-05-19 ENCOUNTER — Telehealth: Payer: Self-pay | Admitting: Cardiology

## 2015-05-19 ENCOUNTER — Ambulatory Visit: Payer: PPO

## 2015-05-19 DIAGNOSIS — I5022 Chronic systolic (congestive) heart failure: Secondary | ICD-10-CM

## 2015-05-19 DIAGNOSIS — Z95810 Presence of automatic (implantable) cardiac defibrillator: Secondary | ICD-10-CM

## 2015-05-19 NOTE — Telephone Encounter (Signed)
Patient called back stating his monitor was not working.  Attempted to troubleshoot and unable to send transmission.  Provided [employer] tech support number.  Also advised him to inform tech services that his monitor has not automatically sending his transmissions.

## 2015-05-19 NOTE — Telephone Encounter (Signed)
Spoke with pt and reminded pt of remote transmission that is due today. Pt verbalized understanding.   

## 2015-05-22 ENCOUNTER — Ambulatory Visit (INDEPENDENT_AMBULATORY_CARE_PROVIDER_SITE_OTHER): Payer: PPO | Admitting: General Practice

## 2015-05-22 DIAGNOSIS — I482 Chronic atrial fibrillation, unspecified: Secondary | ICD-10-CM

## 2015-05-22 DIAGNOSIS — I4891 Unspecified atrial fibrillation: Secondary | ICD-10-CM | POA: Diagnosis not present

## 2015-05-22 LAB — POCT INR: INR: 2.5

## 2015-05-22 NOTE — Progress Notes (Signed)
EPIC Encounter for ICM Monitoring  Patient Name: [name] is a 81 y.o. male Date: 05/22/2015 Primary Care Physican: Laurey Morale, MD Primary Cardiologist: Tamala Julian Electrophysiologist: Allred Dry Weight: Unable to weigh-amputee  Bi-V Pacing 99.9%      In the past month, have you:  1. Gained more than 2 pounds in a day or more than 5 pounds in a week? No  2. Had changes in your medications (with verification of current medications)? No  3. Had more shortness of breath than is usual for you? No  4. Limited your activity because of shortness of breath? No  5. Not been able to sleep because of shortness of breath? No  6. Had increased swelling in your feet or ankles? No  7. Had symptoms of dehydration (dizziness, dry mouth, increased thirst, decreased urine output) No  8. Had changes in sodium restriction? No  9. Been compliant with medication? No   ICM trend: 3 month view for 05/20/2015   ICM trend: 1 year view for 05/20/2015   Follow-up plan: ICM clinic phone appointment on  06/26/2015.  Thoracic impedance below reference line from 04/28/2015 to 05/11/2015 and 05/13/2015 to 05/17/2015 suggesting fluid accumulation.  Thoracic impedance returned to reference on 05/19/2015.   He denied any symptoms and stated he is feeling fine.  Encouraged to call for any fluid symptoms.  No changes today.   Rosalene Billings, RN, CCM 05/22/2015 10:20 AM

## 2015-05-22 NOTE — Progress Notes (Signed)
Pre visit review using our clinic review tool, if applicable. No additional management support is needed unless otherwise documented below in the visit note. 

## 2015-06-20 DIAGNOSIS — H11122 Conjunctival concretions, left eye: Secondary | ICD-10-CM | POA: Diagnosis not present

## 2015-06-20 DIAGNOSIS — E119 Type 2 diabetes mellitus without complications: Secondary | ICD-10-CM | POA: Diagnosis not present

## 2015-06-20 DIAGNOSIS — H2513 Age-related nuclear cataract, bilateral: Secondary | ICD-10-CM | POA: Diagnosis not present

## 2015-06-20 DIAGNOSIS — H02105 Unspecified ectropion of left lower eyelid: Secondary | ICD-10-CM | POA: Diagnosis not present

## 2015-06-20 LAB — HM DIABETES EYE EXAM

## 2015-06-26 ENCOUNTER — Telehealth: Payer: Self-pay | Admitting: Cardiology

## 2015-06-26 ENCOUNTER — Ambulatory Visit (INDEPENDENT_AMBULATORY_CARE_PROVIDER_SITE_OTHER): Payer: PPO | Admitting: *Deleted

## 2015-06-26 DIAGNOSIS — I5022 Chronic systolic (congestive) heart failure: Secondary | ICD-10-CM

## 2015-06-26 DIAGNOSIS — I255 Ischemic cardiomyopathy: Secondary | ICD-10-CM | POA: Diagnosis not present

## 2015-06-26 DIAGNOSIS — Z95810 Presence of automatic (implantable) cardiac defibrillator: Secondary | ICD-10-CM | POA: Diagnosis not present

## 2015-06-26 NOTE — Progress Notes (Signed)
Remote ICD transmission.   

## 2015-06-26 NOTE — Telephone Encounter (Signed)
Spoke with pt and reminded pt of remote transmission that is due today. Pt verbalized understanding.   

## 2015-06-27 NOTE — Progress Notes (Signed)
EPIC Encounter for ICM Monitoring  Patient Name: [name] is a 81 y.o. male Date: 06/27/2015 Primary Care Physican: Laurey Morale, MD Primary Cardiologist: Tamala Julian Electrophysiologist: Allred Dry Weight: Does not weigh   Bi-V Pacing 99.6%      In the past month, have you:  1. Gained more than 2 pounds in a day or more than 5 pounds in a week? unknown  2. Had changes in your medications (with verification of current medications)? no  3. Had more shortness of breath than is usual for you? no  4. Limited your activity because of shortness of breath? no  5. Not been able to sleep because of shortness of breath? no  6. Had increased swelling in your feet or ankles? no  7. Had symptoms of dehydration (dizziness, dry mouth, increased thirst, decreased urine output) no  8. Had changes in sodium restriction? no  9. Been compliant with medication? Yes  ICM trend: 3 month view for 06/27/2015  ICM trend: 1 year view for 06/27/2015  Follow-up plan: ICM clinic phone appointment 08/01/2015.    FLUID LEVELS: Optivol thoracic impedance decreased 05/28/2015 to 06/14/2015 suggesting fluid accumulation and returned to baseline 06/15/2015.   SYMPTOMS: None and reviewed symptoms to report such as weight gain, worsening of SOB with activity, needing additional pillows to sleep at night, and/or lower extremity swelling.  He does not weigh due to amputee.  Encouraged to call for any fluid symptoms.  No changes today.    Rosalene Billings, RN, CCM 06/27/2015 11:20 AM

## 2015-06-30 ENCOUNTER — Encounter: Payer: Self-pay | Admitting: Family

## 2015-07-03 ENCOUNTER — Ambulatory Visit: Payer: PPO

## 2015-07-03 ENCOUNTER — Ambulatory Visit (INDEPENDENT_AMBULATORY_CARE_PROVIDER_SITE_OTHER): Payer: PPO | Admitting: General Practice

## 2015-07-03 DIAGNOSIS — I482 Chronic atrial fibrillation, unspecified: Secondary | ICD-10-CM

## 2015-07-03 DIAGNOSIS — I4891 Unspecified atrial fibrillation: Secondary | ICD-10-CM

## 2015-07-03 LAB — POCT INR: INR: 2.9

## 2015-07-03 NOTE — Progress Notes (Signed)
Pre visit review using our clinic review tool, if applicable. No additional management support is needed unless otherwise documented below in the visit note. 

## 2015-07-05 ENCOUNTER — Encounter: Payer: Self-pay | Admitting: Physical Medicine & Rehabilitation

## 2015-07-05 ENCOUNTER — Encounter: Payer: PPO | Attending: Physical Medicine & Rehabilitation | Admitting: Physical Medicine & Rehabilitation

## 2015-07-05 VITALS — BP 148/66 | HR 87 | Resp 14

## 2015-07-05 DIAGNOSIS — G546 Phantom limb syndrome with pain: Secondary | ICD-10-CM | POA: Diagnosis not present

## 2015-07-05 DIAGNOSIS — Z79899 Other long term (current) drug therapy: Secondary | ICD-10-CM | POA: Diagnosis not present

## 2015-07-05 DIAGNOSIS — M79672 Pain in left foot: Secondary | ICD-10-CM | POA: Insufficient documentation

## 2015-07-05 DIAGNOSIS — G894 Chronic pain syndrome: Secondary | ICD-10-CM

## 2015-07-05 DIAGNOSIS — Z89612 Acquired absence of left leg above knee: Secondary | ICD-10-CM | POA: Diagnosis not present

## 2015-07-05 DIAGNOSIS — Z5181 Encounter for therapeutic drug level monitoring: Secondary | ICD-10-CM | POA: Diagnosis not present

## 2015-07-05 DIAGNOSIS — S78112A Complete traumatic amputation at level between left hip and knee, initial encounter: Secondary | ICD-10-CM

## 2015-07-05 MED ORDER — OXYCODONE-ACETAMINOPHEN 10-325 MG PO TABS: 1.0000 | ORAL_TABLET | ORAL | Status: DC | PRN

## 2015-07-05 NOTE — Progress Notes (Signed)
Subjective:    Patient ID: [name], male    DOB: 10-15-1934, 81 y.o.   MRN: [id number]  HPI   [name] is back regarding his left AKA and chronic phantom pain. He is now working with a Transport planner at the [place]. He is making some progress with his strength and balance but his walking is rather limited at home due to fear of falling/being by himself. He uses a cane at home and awalker for longer dx. He often uses his scooter when he's out of the house (like today).   He claims to be wearing his leg a lot. He massages the leg also. The percocet 10/325 are taken twice daily to help control the pain. He's also on gabapentin 300mg  TID which provides some relief.    Pain Inventory Average Pain 5 Pain Right Now 6 My pain is sharp and tingling  In the last 24 hours, has pain interfered with the following? General activity 5 Relation with others 5 Enjoyment of life 4 What TIME of day is your pain at its worst? evening, night Sleep (in general) Fair  Pain is worse with: sitting and some activites Pain improves with: NA Relief from Meds: 8  Mobility use a cane use a walker ability to climb steps?  yes do you drive?  yes needs help with transfers Do you have any goals in this area?  yes  Function not employed: date last employed 2014 Do you have any goals in this area?  yes  Neuro/Psych depression  Prior Studies Any changes since last visit?  no  Physicians involved in your care Any changes since last visit?  no   Family History  Problem Relation Age of Onset  . Cancer      breast/fhx  . Heart disease      fhx  . Diabetes Neg Hx   . Cancer Father    Social History   Social History  . Marital Status: Widowed    Spouse Name: N/A  . Number of Children: N/A  . Years of Education: N/A   Occupational History  . Retired    Social History Main Topics  . Smoking status: Current Some Day Smoker    Types: Cigars  . Smokeless tobacco: Never Used     Comment: 1  cigar each month  . Alcohol Use: 0.0 oz/week    0 Standard drinks or equivalent per week     Comment: once a week wine  . Drug Use: No  . Sexual Activity: Not Currently   Other Topics Concern  . None   Social History Narrative   Lives in [place] with significant other,  [name].   Past Surgical History  Procedure Laterality Date  . Turp vaporization    . Cardiac defibrillator placement  12/26/09    pacemaker combo  . Cervical epidural injection  2013  . Femoral-tibial bypass graft  09/25/2011    Procedure: BYPASS GRAFT FEMORAL-TIBIAL ARTERY;  Surgeon: Mal Misty, MD;  Location: [facility] OR;  Service: Vascular;  Laterality: Left;  Left Femoral - Anterior Tibial Bypass;  saphenous vein graft from left leg  . Intraoperative arteriogram  09/25/2011    Procedure: INTRA OPERATIVE ARTERIOGRAM;  Surgeon: Mal Misty, MD;  Location: [place];  Service: Vascular;  Laterality: Left;  . Femoral-tibial bypass graft  02/07/2012    Procedure: BYPASS GRAFT FEMORAL-TIBIAL ARTERY;  Surgeon: Rosetta Posner, MD;  Location: [place];  Service: Vascular;  Laterality: Left;  Thrombectomy  Left Femoral - Tibial Bypass Graft  . Embolectomy  02/07/2012    Procedure: EMBOLECTOMY;  Surgeon: Rosetta Posner, MD;  Location: [place];  Service: Vascular;  Laterality: Left;  . Femoral-tibial bypass graft  04/03/2012    Procedure: BYPASS GRAFT FEMORAL-TIBIAL ARTERY;  Surgeon: Mal Misty, MD;  Location: [place];  Service: Vascular;  Laterality: Left;  Redo  . Insert / replace / remove pacemaker  2007  . Coronary angioplasty with stent placement  ~ 2000  . Coronary angioplasty    . Uvulopalatopharyngoplasty (uppp)/tonsillectomy/septoplasty  06/30/2003    Archie Endo 06/30/2003 (07/10/2012)  . Shoulder open rotator cuff repair Right 2001    repair of lacerated right/notes 10/11/1999  (07/10/2012)  . Foot surgery Right 03/20/2001    "have plates and screws in; didn't break it" (07/10/2012)  . Carpal tunnel release Right 2002    Archie Endo  03/20/2001 (07/10/2012)  . Biceps tendon repair Right 2001    Archie Endo 03/20/2001 (07/10/2012)  . Renal artery stent  2009  . Femoral-popliteal bypass graft Left 09/16/2012    Procedure: LEFT FEMORAL-POPLITEAL BYPASS GRAFT WITH GORTEX Propaten Graft 6x80 Thin Wall and Left lower leg Angiogram;  Surgeon: Mal Misty, MD;  Location: [place];  Service: Vascular;  Laterality: Left;  . Colonoscopy      Hx; of  . Tonsillectomy    . Adenoidectomy      Hx; of   . Femoral-tibial bypass graft Left 09/30/2012    Procedure: REDO LEFT FEMORAL-ANTERIOR TIBIAL ARTERY BYPASS USING COMPOSITE CEPHALIC AND BASILIC VEIN GRAFT FROM LEFT ARM;  Surgeon: Mal Misty, MD;  Location: [place];  Service: Vascular;  Laterality: Left;  . I&d extremity Left 10/21/2012    Procedure: EXPLORATION AND DEBRIDEMENT OF LEFT GROIN WOUND;  Surgeon: Mal Misty, MD;  Location: [place];  Service: Vascular;  Laterality: Left;  . Patch angioplasty Left 10/21/2012    Procedure: PATCH ANGIOPLASTY;  Surgeon: Mal Misty, MD;  Location: [place];  Service: Vascular;  Laterality: Left;  . Groin debridement Left 11/12/2012    Procedure: CLOSURE INGUINAL WOUND;  Surgeon: Mal Misty, MD;  Location: [place];  Service: Vascular;  Laterality: Left;  . Embolectomy Left 12/16/2012    Procedure: THROMBECTOMY  LEFT LEG BYPASS;  Surgeon: Elam Dutch, MD;  Location: [place];  Service: Vascular;  Laterality: Left;  . Leg amputation above knee Left 02/04/2013    DR LAWSON  . Amputation Left 02/04/2013    Procedure: AMPUTATION ABOVE KNEE-LEFT;  Surgeon: Mal Misty, MD;  Location: [place];  Service: Vascular;  Laterality: Left;  . Removal of graft Left 04/16/2013    Procedure: I & D LEFT AKA WOUND, POSSIBLE REMOVAL OF INFECTED GORTEX GRAFT;  Surgeon: Mal Misty, MD;  Location: [place];  Service: Vascular;  Laterality: Left;  . Abdominal aortagram N/A 09/11/2011    Procedure: ABDOMINAL Maxcine Ham;  Surgeon: Wellington Hampshire, MD;  Location: [facility] CATH LAB;   Service: Cardiovascular;  Laterality: N/A;  . Lower extremity angiogram Left 12/25/2011    Procedure: LOWER EXTREMITY ANGIOGRAM;  Surgeon: Serafina Mitchell, MD;  Location: [facility] CATH LAB;  Service: Cardiovascular;  Laterality: Left;  lt leg angio co2  . Abdominal angiogram  12/25/2011    Procedure: ABDOMINAL ANGIOGRAM;  Surgeon: Serafina Mitchell, MD;  Location: [facility] CATH LAB;  Service: Cardiovascular;;  . Lower extremity angiogram N/A 07/07/2012    Procedure: LOWER EXTREMITY ANGIOGRAM;  Surgeon: Conrad Fairfield, MD;  Location:  [place] CATH LAB;  Service: Cardiovascular;  Laterality: N/A;  . Lower extremity angiogram N/A 09/15/2012    Procedure: LOWER EXTREMITY ANGIOGRAM;  Surgeon: Serafina Mitchell, MD;  Location: [facility] CATH LAB;  Service: Cardiovascular;  Laterality: N/A;  . Upper extremity angiogram  09/15/2012    Procedure: UPPER EXTREMITY ANGIOGRAM;  Surgeon: Serafina Mitchell, MD;  Location: [facility] CATH LAB;  Service: Cardiovascular;;  . Biv icd genertaor change out N/A 11/09/2013    Procedure: BIV ICD GENERTAOR CHANGE OUT;  Surgeon: Coralyn Mark, MD;  Location: [facility] CATH LAB;  Service: Cardiovascular;  Laterality: N/A;   Past Medical History  Diagnosis Date  . Gout   . Benign prostatic hypertrophy     takes Proscar daily  . Atrial fibrillation ([place])     takes Warfarin daily  . Chronic systolic dysfunction of left ventricle     a. mixed ischemic and nonischemic CM,  EF 35%. b. s/p AICD implantation.  . ED (erectile dysfunction)   . Arthritis   . Pacemaker     medtronic  . CAD (coronary artery disease)     a. s/p mid LAD stenting with DES 2008 Dr. Daneen Schick  . ICD (implantable cardiac defibrillator) in place     medtronic, Dr. Rayann Heman  . ICD (implantable cardiac defibrillator) in place   . Sleep apnea     hx of "had surgery for"  . Automatic implantable cardioverter-defibrillator in situ   . Depression   . Numbness and tingling     Hx; 89f left foot  . PAD (peripheral artery disease) (HCC)     Severe by  PV angiogram 09/2011  . Renal artery stenosis ([place])     s/p stenting 2009  . PAD (peripheral artery disease) (HCC)     s/p multiple LLE bypass grafts; left mid-distal SCA occlusion by 08/2012 duplex  . Hypertension     takes Carvedilol and Losartan daily  . Constipation     takes Miralax daily as needed and Colace daily   . Type II diabetes mellitus (HCC)     takes Novolog 70/30  . CHF (congestive heart failure) (HCC)     takes Lasix daily  . History of MRSA infection   . Insomnia     TAKES TRAZODONE NIGHTLY   BP 148/66 mmHg  Pulse 87  Resp 14  Opioid Risk Score:   Fall Risk Score:  `1  Depression screen PHQ 2/9  Depression screen [facility] 2/9 05/09/2015 11/23/2014 06/03/2014  Decreased Interest 2 2 2   Down, Depressed, Hopeless 1 1 1   PHQ - 2 Score 3 3 3   Altered sleeping - - 3  Tired, decreased energy - - 2  Change in appetite - - 0  Feeling bad or failure about yourself  - - 0  Trouble concentrating - - 1  Moving slowly or fidgety/restless - - 0  Suicidal thoughts - - 0  PHQ-9 Score - - 9     Review of Systems  Psychiatric/Behavioral: Positive for dysphoric mood.  All other systems reviewed and are negative.      Objective:   Physical Exam  Constitutional: He is oriented to person, place, and time. He appears well-developed and well-nourished.  HENT:  Head: Normocephalic and atraumatic.  Eyes: Conjunctivae are normal. Pupils are equal, round, and reactive to light.  Neck: Neck supple.  Cardiovascular: Normal rate and regular rhythm. No rubs, gallops. Pacer site dressed  No murmur heard.  Respiratory: Effort normal and breath sounds normal. No  respiratory distress. He has no wheezes.  GI: Soft. Bowel sounds are normal. He exhibits no distension. There is no tenderness.  Musculoskeletal:  L-AKA prosthesis socket in place and fitting appropriately  Neurological: He is alert and oriented to person, place, and time.  Follows commands. Strength grossly intact in UE's at  5/5. RLE is 5/5 prox to 5/5 distally. Left HF 5/5. mild sensory loss in distal right leg/foot.  Skin: Intact.  Psychiatric: He has a normal mood and affect. His speech is normal and behavior is normal. Judgment and thought content normal. Cognition and memory are normal.    Assessment/Plan:  1. Functional deficits secondary to left AKA.  -continue with PT---needs to improve stamina, balance, and confidence 2. Phantom limb pain---  -continue gabapentin 300mg  tid. Hasn't been able to tolerate a higher dose or other anticonvulsants. Cannot use TCA's due to his CV history -continue percocet to 10/325 to help with current pain and with prosthetic training. #60, 2nd RF for next month  -nucynta is an option but will not be on his drug formulary -will refer for sympathetic ganglion block for LLE --Dr. Nadean Corwin discussed procedure today -discussed ongoing prosthesis wear and visual therapy as well---he hasn't bought into this fully 3. Follow up with me in about 2 months. 15 minutes of face to face patient care time were spent during this visit. All questions were encouraged and answered.

## 2015-07-05 NOTE — Addendum Note (Signed)
Addended by: Gara Kroner L on: 07/05/2015 02:41 PM   Modules accepted: Orders

## 2015-07-05 NOTE — Patient Instructions (Signed)
PLEASE CALL ME WITH ANY PROBLEMS OR QUESTIONS [id number]).     LUMBAR SYMPATHETIC BLOCK???

## 2015-07-10 ENCOUNTER — Encounter: Payer: Self-pay | Admitting: Family

## 2015-07-10 ENCOUNTER — Ambulatory Visit (HOSPITAL_COMMUNITY)
Admission: RE | Admit: 2015-07-10 | Discharge: 2015-07-10 | Disposition: A | Discharge status: Home / Self Care | Payer: PPO | Source: Ambulatory Visit | Attending: Family | Admitting: Family

## 2015-07-10 ENCOUNTER — Ambulatory Visit (INDEPENDENT_AMBULATORY_CARE_PROVIDER_SITE_OTHER): Payer: PPO | Admitting: Family

## 2015-07-10 VITALS — BP 154/72 | HR 72 | Temp 97.0°F | Ht 67.25 in | Wt 179.4 lb

## 2015-07-10 DIAGNOSIS — Z89612 Acquired absence of left leg above knee: Secondary | ICD-10-CM

## 2015-07-10 DIAGNOSIS — S78112A Complete traumatic amputation at level between left hip and knee, initial encounter: Secondary | ICD-10-CM

## 2015-07-10 DIAGNOSIS — Z48812 Encounter for surgical aftercare following surgery on the circulatory system: Secondary | ICD-10-CM | POA: Diagnosis not present

## 2015-07-10 DIAGNOSIS — Z79899 Other long term (current) drug therapy: Secondary | ICD-10-CM | POA: Insufficient documentation

## 2015-07-10 DIAGNOSIS — I509 Heart failure, unspecified: Secondary | ICD-10-CM | POA: Diagnosis not present

## 2015-07-10 DIAGNOSIS — I110 Hypertensive heart disease with heart failure: Secondary | ICD-10-CM | POA: Insufficient documentation

## 2015-07-10 DIAGNOSIS — E1151 Type 2 diabetes mellitus with diabetic peripheral angiopathy without gangrene: Secondary | ICD-10-CM

## 2015-07-10 DIAGNOSIS — I2510 Atherosclerotic heart disease of native coronary artery without angina pectoris: Secondary | ICD-10-CM | POA: Diagnosis not present

## 2015-07-10 DIAGNOSIS — I779 Disorder of arteries and arterioles, unspecified: Secondary | ICD-10-CM | POA: Diagnosis not present

## 2015-07-10 DIAGNOSIS — I739 Peripheral vascular disease, unspecified: Secondary | ICD-10-CM | POA: Insufficient documentation

## 2015-07-10 DIAGNOSIS — R0989 Other specified symptoms and signs involving the circulatory and respiratory systems: Secondary | ICD-10-CM | POA: Diagnosis not present

## 2015-07-10 NOTE — Progress Notes (Signed)
[facility] [place] HISTORY AND PHYSICAL -PAD  History of Present Illness [name] is a 81 y.o. male patient of Dr. 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 He has a new prothesis and is in physical therapy for gait training. He has no new complaints. He continues to have phantom pain in the left AKA stump and is followed by Dr. Naaman Plummer. He is on gabapentin and percocet for pain control. He continues to take coumadin daily and has had a new pace maker placed 3 weeks ago due to old pace maker battery dying.  He is here today for follow up right LE ABI studies. He reports no opeinngs in the skin, no right foot pain, or edema. He has had a history of claudication in the left upper extremity with numbness and tingling in the left hand.  He has known left subclavian occlusion that is asymptomatic.   Pt denies claudication sx's in his right lower extremity with walking or at rest. He bumped his right shin a couple of times, has applied Neosporin and this seems to be healing well. He denies any non healing wounds or ulcers.  Pt Diabetic: Yes, pt states his last A1C was about 7.9 Pt smoker: never smoker  Pt meds include: Statin :No Betablocker: Yes ASA: No Other anticoagulants/antiplatelets: warfarin   Past Medical History  Diagnosis Date  . Gout   . Benign prostatic hypertrophy     takes Proscar daily  . Atrial fibrillation ([place])     takes Warfarin daily  . Chronic systolic dysfunction of left ventricle     a. mixed ischemic and nonischemic CM,  EF 35%. b. s/p AICD implantation.  . ED (erectile dysfunction)   . Arthritis   . Pacemaker     medtronic  . CAD (coronary artery disease)     a. s/p mid LAD stenting with DES 2008 Dr. Daneen Schick  . ICD (implantable cardiac defibrillator) in place     medtronic, Dr. Rayann Heman  . ICD (implantable cardiac defibrillator) in place   . Sleep apnea     hx of "had surgery for"  . Automatic implantable  cardioverter-defibrillator in situ   . Depression   . Numbness and tingling     Hx; 61f left foot  . PAD (peripheral artery disease) (HCC)     Severe by PV angiogram 09/2011  . Renal artery stenosis ([place])     s/p stenting 2009  . PAD (peripheral artery disease) (HCC)     s/p multiple LLE bypass grafts; left mid-distal SCA occlusion by 08/2012 duplex  . Hypertension     takes Carvedilol and Losartan daily  . Constipation     takes Miralax daily as needed and Colace daily   . Type II diabetes mellitus (HCC)     takes Novolog 70/30  . CHF (congestive heart failure) (HCC)     takes Lasix daily  . History of MRSA infection   . Insomnia     TAKES TRAZODONE NIGHTLY    Social History Social History  Substance Use Topics  . Smoking status: Current Some Day Smoker    Types: Cigars  . Smokeless tobacco: Never Used     Comment: 1 cigar each month  . Alcohol Use: 0.0 oz/week    0 Standard drinks or equivalent per week     Comment: once a week wine    Family History Family History  Problem Relation Age of Onset  . Cancer  breast/fhx  . Heart disease      fhx  . Diabetes Neg Hx   . Cancer Father     Past Surgical History  Procedure Laterality Date  . Turp vaporization    . Cardiac defibrillator placement  12/26/09    pacemaker combo  . Cervical epidural injection  2013  . Femoral-tibial bypass graft  09/25/2011    Procedure: BYPASS GRAFT FEMORAL-TIBIAL ARTERY;  Surgeon: Mal Misty, MD;  [facility];  Service: Vascular;  Laterality: Left;  Left Femoral - Anterior Tibial Bypass;  saphenous vein graft from left leg  . Intraoperative arteriogram  09/25/2011    Procedure: INTRA OPERATIVE ARTERIOGRAM;  Surgeon: Mal Misty, MD;  Location: [place];  Service: Vascular;  Laterality: Left;  . Femoral-tibial bypass graft  02/07/2012    Procedure: BYPASS GRAFT FEMORAL-TIBIAL ARTERY;  Surgeon: Rosetta Posner, MD;  Location: [place];  Service: Vascular;  Laterality: Left;  Thrombectomy  Left Femoral - Tibial Bypass Graft  . Embolectomy  02/07/2012    Procedure: EMBOLECTOMY;  Surgeon: Rosetta Posner, MD;  Location: [place];  Service: Vascular;  Laterality: Left;  . Femoral-tibial bypass graft  04/03/2012    Procedure: BYPASS GRAFT FEMORAL-TIBIAL ARTERY;  Surgeon: Mal Misty, MD;  Location: [place];  Service: Vascular;  Laterality: Left;  Redo  . Insert / replace / remove pacemaker  2007  . Coronary angioplasty with stent placement  ~ 2000  . Coronary angioplasty    . Uvulopalatopharyngoplasty (uppp)/tonsillectomy/septoplasty  06/30/2003    Archie Endo 06/30/2003 (07/10/2012)  . Shoulder open rotator cuff repair Right 2001    repair of lacerated right/notes 10/11/1999  (07/10/2012)  . Foot surgery Right 03/20/2001    "have plates and screws in; didn't break it" (07/10/2012)  . Carpal tunnel release Right 2002    Archie Endo 03/20/2001 (07/10/2012)  . Biceps tendon repair Right 2001    Archie Endo 03/20/2001 (07/10/2012)  . Renal artery stent  2009  . Femoral-popliteal bypass graft Left 09/16/2012    Procedure: LEFT FEMORAL-POPLITEAL BYPASS GRAFT WITH GORTEX Propaten Graft 6x80 Thin Wall and Left lower leg Angiogram;  Surgeon: Mal Misty, MD;  Location: [place];  Service: Vascular;  Laterality: Left;  . Colonoscopy      Hx; of  . Tonsillectomy    . Adenoidectomy      Hx; of   . Femoral-tibial bypass graft Left 09/30/2012    Procedure: REDO LEFT FEMORAL-ANTERIOR TIBIAL ARTERY BYPASS USING COMPOSITE CEPHALIC AND BASILIC VEIN GRAFT FROM LEFT ARM;  Surgeon: Mal Misty, MD;  Location: [place];  Service: Vascular;  Laterality: Left;  . I&d extremity Left 10/21/2012    Procedure: EXPLORATION AND DEBRIDEMENT OF LEFT GROIN WOUND;  Surgeon: Mal Misty, MD;  Location: [place];  Service: Vascular;  Laterality: Left;  . Patch angioplasty Left 10/21/2012    Procedure: PATCH ANGIOPLASTY;  Surgeon: Mal Misty, MD;  Location: [place];  Service: Vascular;  Laterality: Left;  . Groin debridement Left 11/12/2012     Procedure: CLOSURE INGUINAL WOUND;  Surgeon: Mal Misty, MD;  Location: [place];  Service: Vascular;  Laterality: Left;  . Embolectomy Left 12/16/2012    Procedure: THROMBECTOMY  LEFT LEG BYPASS;  Surgeon: Elam Dutch, MD;  Location: [place];  Service: Vascular;  Laterality: Left;  . Leg amputation above knee Left 02/04/2013    DR LAWSON  . Amputation Left 02/04/2013    Procedure: AMPUTATION ABOVE KNEE-LEFT;  Surgeon: Nelda Severe  Kellie Simmering, MD;  Location: [facility] OR;  Service: Vascular;  Laterality: Left;  . Removal of graft Left 04/16/2013    Procedure: I & D LEFT AKA WOUND, POSSIBLE REMOVAL OF INFECTED GORTEX GRAFT;  Surgeon: Mal Misty, MD;  Location: [place];  Service: Vascular;  Laterality: Left;  . Abdominal aortagram N/A 09/11/2011    Procedure: ABDOMINAL Maxcine Ham;  Surgeon: Wellington Hampshire, MD;  Location: [facility] CATH LAB;  Service: Cardiovascular;  Laterality: N/A;  . Lower extremity angiogram Left 12/25/2011    Procedure: LOWER EXTREMITY ANGIOGRAM;  Surgeon: Serafina Mitchell, MD;  Location: [facility] CATH LAB;  Service: Cardiovascular;  Laterality: Left;  lt leg angio co2  . Abdominal angiogram  12/25/2011    Procedure: ABDOMINAL ANGIOGRAM;  Surgeon: Serafina Mitchell, MD;  Location: [facility] CATH LAB;  Service: Cardiovascular;;  . Lower extremity angiogram N/A 07/07/2012    Procedure: LOWER EXTREMITY ANGIOGRAM;  Surgeon: Conrad Red [place], MD;  Location: [facility] CATH LAB;  Service: Cardiovascular;  Laterality: N/A;  . Lower extremity angiogram N/A 09/15/2012    Procedure: LOWER EXTREMITY ANGIOGRAM;  Surgeon: Serafina Mitchell, MD;  Location: [facility] CATH LAB;  Service: Cardiovascular;  Laterality: N/A;  . Upper extremity angiogram  09/15/2012    Procedure: UPPER EXTREMITY ANGIOGRAM;  Surgeon: Serafina Mitchell, MD;  Location: [facility] CATH LAB;  Service: Cardiovascular;;  . Biv icd genertaor change out N/A 11/09/2013    Procedure: BIV ICD GENERTAOR CHANGE OUT;  Surgeon: Coralyn Mark, MD;  Location: [facility] CATH LAB;  Service: Cardiovascular;   Laterality: N/A;    Allergies  Allergen Reactions  . Other Other (See Comments)    Plastic tape tears skin  . Bactrim Ds [Sulfamethoxazole-Trimethoprim] Other (See Comments)    Hand tremors    Current Outpatient Prescriptions  Medication Sig Dispense Refill  . allopurinol (ZYLOPRIM) 100 MG tablet Take 1 tablet (100 mg total) by mouth daily as needed (Gout). 90 tablet 3  . amLODipine (NORVASC) 5 MG tablet Take 1 tablet (5 mg total) by mouth daily. 90 tablet 3  . carvedilol (COREG) 25 MG tablet Take 1 tablet (25 mg total) by mouth 2 (two) times daily with a meal. 180 tablet 3  . finasteride (PROSCAR) 5 MG tablet Take 1 tablet (5 mg total) by mouth daily. 90 tablet 3  . furosemide (LASIX) 40 MG tablet Take 20 mg by mouth daily.     Marland Kitchen gabapentin (NEURONTIN) 300 MG capsule Take 1 capsule (300 mg total) by mouth 3 (three) times daily. 270 capsule 3  . insulin aspart protamine- aspart (NOVOLOG MIX 70/30) (70-30) 100 UNIT/ML injection Inject 7 Units into the skin 2 (two) times daily with a meal.    . Insulin Pen Needle 31G X 8 MM MISC Use 2-3 times per day and diagnosis code is 250.00 100 each 1  . losartan (COZAAR) 100 MG tablet Take 1 tablet (100 mg total) by mouth daily. 90 tablet 3  . nitroGLYCERIN (NITROSTAT) 0.4 MG SL tablet Place 1 tablet (0.4 mg total) under the tongue every 5 (five) minutes as needed for chest pain. For chest pain, max 3 doses 90 tablet 3  . ONE TOUCH ULTRA TEST test strip USE TO TEST BLOOD SUGAR TWO TIMES DAILY AS INSTRUCTED BY PHYSICIAN. 100 each 1  . oxyCODONE-acetaminophen (PERCOCET) 10-325 MG tablet Take 1 tablet by mouth every 4 (four) hours as needed for pain. 60 tablet 0  . sildenafil (VIAGRA) 100 MG tablet Take as directed 5 tablet 0  .  warfarin (COUMADIN) 5 MG tablet TAKE AS DIRECTED BY ANTICOAGULATION CLINIC 60 tablet 1   No current facility-administered medications for this visit.    ROS: See HPI for pertinent positives and negatives.   Physical  Examination  Filed Vitals:   07/10/15 1451  BP: 154/72  Pulse: 72  Temp: 97 F (36.1 C)  TempSrc: Oral  Height: 5' 7.25" (1.708 m)  Weight: 179 lb 6.4 oz (81.375 kg)  SpO2: 98%   Body mass index is 27.89 kg/(m^2).  General: A&O x 3, WDWN, in wheelchair. Pulmonary: CTAB, without wheezes , rales or rhonchi. Cardiac: regular Rhythm, no detected murmur   VASCULAR EXAM: Extremities without ischemic changes, without Gangrene; no open wounds. Left AKA, not wearing prosthesis.      LE Pulses LEFT RIGHT   FEMORAL  palpable  palpable    POPLITEAL AKA  not palpable   POSTERIOR TIBIAL AKA  not palpable    DORSALIS PEDIS  ANTERIOR TIBIAL AKA  not palpable    Abdomen: soft, NT, no palpable masses. Skin: no rashes, no ulcers noted. Musculoskeletal: no muscle wasting or atrophy. Neurologic: A&O X 3; Appropriate Affect ; SENSATION: normal; MOTOR FUNCTION: moving all extremities equally. Speech is fluent/normal. CN 2-12 grossly intact.          Non-Invasive Vascular Imaging: DATE: 07/10/2015 ABI: RIGHT: 0.20 (0.79, 12/31/14), Waveforms: monophasic;  LEFT: AKA   ASSESSMENT: [name] is a 81 y.o. male s/p left AKA Feb 04, 2013. On 04/16/2013 he was taken to the OR for an infected sinus tract of his left AKA stump, an I&D was performed. His left AKA stump has remained well healed. 06/16/14 carotid duplex at [facility] indicated minimal bilateral ICA stenosies.  Dr. Kellie Simmering was made aware of pt's ABI results today compared to 12/31/14, no claudication and no signs of ischemia, by ultrasound technologist and advised 6 months follow up with ABI's.  Pt's primary atherosclerotic risk factor remains his DM, currently almost in control. Fortunately he has never used  tobacco.  PLAN:  Based on the patient's vascular studies and examination, pt will return to clinic in 6 months with ABI's. I advised pt to notify us if he develops non healing wounds or a darkening of his right toes/leg.  I discussed in depth with the patient the nature of atherosclerosis, and emphasized the importance of maximal medical management including strict control of blood pressure, blood glucose, and lipid levels, obtaining regular exercise, and continued cessation of smoking.  The patient is aware that without maximal medical management the underlying atherosclerotic disease process will progress, limiting the benefit of any interventions.  The patient was given information about PAD including signs, symptoms, treatment, what symptoms should prompt the patient to seek immediate medical care, and risk reduction measures to take.  Clemon Chambers, RN, MSN, FNP-C [facility] Phone: [phone]  Clinic MD: Trula Slade  07/10/2015 2:53 PM

## 2015-07-11 ENCOUNTER — Ambulatory Visit: Payer: PPO | Admitting: Family

## 2015-07-11 ENCOUNTER — Encounter (HOSPITAL_COMMUNITY): Payer: Commercial Managed Care - HMO

## 2015-07-12 LAB — TOXASSURE SELECT,+ANTIDEPR,UR

## 2015-07-12 NOTE — Progress Notes (Signed)
Urine drug screen for this encounter is consistent for prescribed medication 

## 2015-07-17 ENCOUNTER — Telehealth: Payer: Self-pay | Admitting: Family

## 2015-07-17 NOTE — Telephone Encounter (Signed)
I returned Mr. Economos phone call.  He reports pain in his right calf since he has been walking a great deal with his walker visiting his family in [place], [place]. The right calf pain resolves after a short rest. He denies any other new symptoms in his right leg/foot. He denies discoloration or non healing wounds in his right foot/leg. We discussed that this pain is to be expected since he is walking more than usual and he has limited arterial perfusion to his right ankle.  Fortunately the right calf pain resolves with a short rest. I encouraged him to continue walking as much as possible as this will improve the circulation in his right leg. I let him know that I appreciate him notifying us of any concern re the circulation in his right leg/foot, and that if he develops any further concerns to let us know.

## 2015-08-01 ENCOUNTER — Telehealth: Payer: Self-pay | Admitting: Cardiology

## 2015-08-01 NOTE — Telephone Encounter (Signed)
Spoke with pt and reminded pt of remote transmission that is due today. Pt verbalized understanding.   

## 2015-08-03 LAB — CUP PACEART REMOTE DEVICE CHECK
Battery Remaining Longevity: 85 mo
Brady Statistic AP VS Percent: 0 %
Brady Statistic AS VS Percent: 0.55 %
HIGH POWER IMPEDANCE MEASURED VALUE: 42 Ohm
HIGH POWER IMPEDANCE MEASURED VALUE: 55 Ohm
Implantable Lead Implant Date: 20111024
Implantable Lead Location: 753858
Implantable Lead Location: 753860
Implantable Lead Model: 6947
Lead Channel Impedance Value: 380 Ohm
Lead Channel Impedance Value: 456 Ohm
Lead Channel Impedance Value: 456 Ohm
Lead Channel Impedance Value: 513 Ohm
Lead Channel Pacing Threshold Amplitude: 0.625 V
Lead Channel Pacing Threshold Amplitude: 1.25 V
Lead Channel Pacing Threshold Pulse Width: 0.4 ms
Lead Channel Sensing Intrinsic Amplitude: 17.875 mV
Lead Channel Setting Pacing Amplitude: 2.5 V
Lead Channel Setting Pacing Pulse Width: 0.4 ms
MDC IDC LEAD IMPLANT DT: 20111024
MDC IDC LEAD IMPLANT DT: 20111024
MDC IDC LEAD LOCATION: 753859
MDC IDC LEAD MODEL: 4196
MDC IDC MSMT BATTERY VOLTAGE: 2.95 V
MDC IDC MSMT LEADCHNL LV IMPEDANCE VALUE: 513 Ohm
MDC IDC MSMT LEADCHNL LV IMPEDANCE VALUE: 874 Ohm
MDC IDC MSMT LEADCHNL RA SENSING INTR AMPL: 0.25 mV
MDC IDC MSMT LEADCHNL RA SENSING INTR AMPL: 0.25 mV
MDC IDC MSMT LEADCHNL RV PACING THRESHOLD PULSEWIDTH: 0.4 ms
MDC IDC MSMT LEADCHNL RV SENSING INTR AMPL: 17.875 mV
MDC IDC SESS DTM: 20170424171622
MDC IDC SET LEADCHNL LV PACING AMPLITUDE: 2.5 V
MDC IDC SET LEADCHNL RV PACING PULSEWIDTH: 0.4 ms
MDC IDC SET LEADCHNL RV SENSING SENSITIVITY: 0.3 mV
MDC IDC STAT BRADY AP VP PERCENT: 0 %
MDC IDC STAT BRADY AS VP PERCENT: 99.45 %
MDC IDC STAT BRADY RA PERCENT PACED: 0 %
MDC IDC STAT BRADY RV PERCENT PACED: 99.63 %

## 2015-08-04 ENCOUNTER — Encounter: Payer: Self-pay | Admitting: Cardiology

## 2015-08-14 ENCOUNTER — Ambulatory Visit (INDEPENDENT_AMBULATORY_CARE_PROVIDER_SITE_OTHER): Payer: PPO | Admitting: General Practice

## 2015-08-14 DIAGNOSIS — I482 Chronic atrial fibrillation, unspecified: Secondary | ICD-10-CM

## 2015-08-14 DIAGNOSIS — I4891 Unspecified atrial fibrillation: Secondary | ICD-10-CM | POA: Diagnosis not present

## 2015-08-14 LAB — POCT INR: INR: 3.5

## 2015-08-14 NOTE — Progress Notes (Signed)
Pre visit review using our clinic review tool, if applicable. No additional management support is needed unless otherwise documented below in the visit note. 

## 2015-08-17 ENCOUNTER — Ambulatory Visit (INDEPENDENT_AMBULATORY_CARE_PROVIDER_SITE_OTHER): Payer: PPO | Admitting: Family Medicine

## 2015-08-17 ENCOUNTER — Encounter: Payer: Self-pay | Admitting: Family Medicine

## 2015-08-17 VITALS — BP 138/77 | HR 62 | Temp 98.5°F

## 2015-08-17 DIAGNOSIS — E1165 Type 2 diabetes mellitus with hyperglycemia: Secondary | ICD-10-CM

## 2015-08-17 DIAGNOSIS — L89159 Pressure ulcer of sacral region, unspecified stage: Secondary | ICD-10-CM | POA: Insufficient documentation

## 2015-08-17 DIAGNOSIS — Z794 Long term (current) use of insulin: Secondary | ICD-10-CM

## 2015-08-17 DIAGNOSIS — I5022 Chronic systolic (congestive) heart failure: Secondary | ICD-10-CM | POA: Diagnosis not present

## 2015-08-17 DIAGNOSIS — L89152 Pressure ulcer of sacral region, stage 2: Secondary | ICD-10-CM | POA: Diagnosis not present

## 2015-08-17 DIAGNOSIS — I10 Essential (primary) hypertension: Secondary | ICD-10-CM

## 2015-08-17 DIAGNOSIS — IMO0002 Reserved for concepts with insufficient information to code with codable children: Secondary | ICD-10-CM

## 2015-08-17 DIAGNOSIS — I739 Peripheral vascular disease, unspecified: Secondary | ICD-10-CM

## 2015-08-17 DIAGNOSIS — N183 Chronic kidney disease, stage 3 (moderate): Secondary | ICD-10-CM

## 2015-08-17 DIAGNOSIS — E0842 Diabetes mellitus due to underlying condition with diabetic polyneuropathy: Secondary | ICD-10-CM

## 2015-08-17 DIAGNOSIS — E1122 Type 2 diabetes mellitus with diabetic chronic kidney disease: Secondary | ICD-10-CM | POA: Diagnosis not present

## 2015-08-17 MED ORDER — GABAPENTIN 300 MG PO CAPS: 600.0000 mg | ORAL_CAPSULE | Freq: Three times a day (TID) | ORAL | Status: DC

## 2015-08-17 NOTE — Progress Notes (Signed)
Pre visit review using our clinic review tool, if applicable. No additional management support is needed unless otherwise documented below in the visit note. Pt unable to stand and weigh.   

## 2015-08-17 NOTE — Progress Notes (Signed)
No ICM remote transmission received for 08/01/2015.  Next remote transmission scheduled for 09/25/2015.

## 2015-08-17 NOTE — Progress Notes (Signed)
   Subjective:    Patient ID: [name], male    DOB: Jan 18, 1935, 81 y.o.   MRN: [id number]  HPI Here to discuss several issues. First his diabetes has been fairly well controlled, and his fasting glucose this am was 128. He has not had an A1c drawn since April 2016 however. He asks me to check a tender spot on his bottom that he noticed a few weeks ago. He has never had anything like this before. He has been applying Neosporin daily and it seems to be getting smaller. He walks around his house some every day but he spends most of his time sitting in his wheelchair. He has a gel seat cushion at home but he never uses it. Also he asks about the circulation in the right leg. He gets aching pains in the leg with minimal exertion and with walking only short distances. Dr. Kellie Simmering performed vascular studies on the leg on 07-10-15 and this showed critical ischemia in the leg. He has never shown signs of skin breakdown in the leg or foot so far. He complains about frequent urinations and he still takes Lasix 20 mg daily. He complains about burning and tingling in both arms and hands. He enjoyed a trip to [place] 2 weeks ago to visit his children.    Review of Systems  Constitutional: Negative.   Respiratory: Negative.   Cardiovascular: Negative.   Genitourinary: Positive for frequency. Negative for dysuria and urgency.  Musculoskeletal: Positive for myalgias.  Skin: Positive for wound.  Neurological: Positive for numbness.       Objective:   Physical Exam  Constitutional: He is oriented to person, place, and time. He appears well-developed and well-nourished.  Cardiovascular: Normal rate, regular rhythm and normal heart sounds.   He has no palpable pulses in the right foot. The skin is shiny and red but warm  Pulmonary/Chest: Effort normal and breath sounds normal.  Musculoskeletal: He exhibits no edema.  Neurological: He is alert and oriented to person, place, and time.  Skin:  There is a 6  mm shallow decubitus ulcer over the right sacral area which has an edge of granulation tissue. It looks clean           Assessment & Plan:  His diabetes seems to be stable but we will check an A1c wit his labs at his cpx. He is scheduled for labs and a well exam here in 2 weeks. For the neuropathy in the hands we will increase Gabapentin to 600 mg tid. We will stop the Lasix to help with frequent urinations. As for the claudication in the right leg I suggested he exercise the leg as much as e can tolerate. He still smokes 1 or 2 cigars a month so I asked him to quit. The sacral decubitus ulcer seems to be healing in but I suggested he use the gel cushion in his wheelchair seat at all times.  Laurey Morale, MD

## 2015-08-17 NOTE — Addendum Note (Signed)
Addended by: Mena Goes on: 08/17/2015 04:57 PM   Modules accepted: Orders

## 2015-08-21 ENCOUNTER — Encounter: Payer: Self-pay | Admitting: Cardiology

## 2015-08-22 ENCOUNTER — Other Ambulatory Visit: Payer: Self-pay | Admitting: Family Medicine

## 2015-08-25 ENCOUNTER — Telehealth: Payer: Self-pay | Admitting: Family Medicine

## 2015-08-25 NOTE — Telephone Encounter (Signed)
I faxed a copy of printed script with below directions.

## 2015-08-25 NOTE — Telephone Encounter (Signed)
[name] w/[facility] wants to verify the change in dosage with the Gabapentin medication.  He was first taking one three times a day and now it's two capsules three times a day.

## 2015-08-25 NOTE — Telephone Encounter (Signed)
Yes we recently increased this to 2 capsules TID

## 2015-08-26 ENCOUNTER — Telehealth: Payer: Self-pay | Admitting: Vascular Surgery

## 2015-08-26 NOTE — Telephone Encounter (Signed)
Sched lab 7/10 at 3:30 and NP 7/18 at 9:45. Lm on hm# to inform pt.

## 2015-08-26 NOTE — Telephone Encounter (Signed)
-----   Message from Viann Fish, NP sent at 08/22/2015  1:34 PM EDT ----- Regarding: schedule july appointment Please schedule pt to follow up with me on a day that a vascular surgeon is in the office, not in vein clinic, for the third week in July, with right ABI, see me, I will consult vascular surgeon. Thank you, Vinnie Level

## 2015-08-28 ENCOUNTER — Other Ambulatory Visit (INDEPENDENT_AMBULATORY_CARE_PROVIDER_SITE_OTHER): Payer: PPO

## 2015-08-28 ENCOUNTER — Telehealth: Payer: Self-pay | Admitting: Family Medicine

## 2015-08-28 DIAGNOSIS — IMO0002 Reserved for concepts with insufficient information to code with codable children: Secondary | ICD-10-CM

## 2015-08-28 DIAGNOSIS — Z0000 Encounter for general adult medical examination without abnormal findings: Secondary | ICD-10-CM | POA: Diagnosis not present

## 2015-08-28 DIAGNOSIS — E1165 Type 2 diabetes mellitus with hyperglycemia: Principal | ICD-10-CM

## 2015-08-28 DIAGNOSIS — Z794 Long term (current) use of insulin: Principal | ICD-10-CM

## 2015-08-28 DIAGNOSIS — E1122 Type 2 diabetes mellitus with diabetic chronic kidney disease: Secondary | ICD-10-CM

## 2015-08-28 DIAGNOSIS — N183 Chronic kidney disease, stage 3 (moderate): Principal | ICD-10-CM

## 2015-08-28 LAB — BASIC METABOLIC PANEL
BUN: 26 mg/dL — AB (ref 6–23)
CO2: 26 mEq/L (ref 19–32)
Calcium: 8.7 mg/dL (ref 8.4–10.5)
Chloride: 109 mEq/L (ref 96–112)
Creatinine, Ser: 1.75 mg/dL — ABNORMAL HIGH (ref 0.40–1.50)
GFR: 40.12 mL/min — AB (ref >60.00)
Glucose, Bld: 65 mg/dL — ABNORMAL LOW (ref 70–99)
POTASSIUM: 5.2 meq/L — AB (ref 3.5–5.1)
Sodium: 141 mEq/L (ref 135–145)

## 2015-08-28 LAB — CBC WITH DIFFERENTIAL/PLATELET
Basophils Absolute: 0 10*3/uL (ref 0.0–0.1)
Basophils Relative: 0.7 % (ref 0.0–3.0)
EOS ABS: 0.2 10*3/uL (ref 0.0–0.7)
EOS PCT: 3 % (ref 0.0–5.0)
HCT: 39 % (ref 39.0–52.0)
Hemoglobin: 12.9 g/dL — ABNORMAL LOW (ref 13.0–17.0)
Lymphocytes Relative: 16.3 % (ref 12.0–46.0)
Lymphs Abs: 1 10*3/uL (ref 0.7–4.0)
MCHC: 33 g/dL (ref 30.0–36.0)
MCV: 85.3 fl (ref 78.0–100.0)
MONO ABS: 0.5 10*3/uL (ref 0.1–1.0)
Monocytes Relative: 8.5 % (ref 3.0–12.0)
NEUTROS PCT: 71.5 % (ref 43.0–77.0)
Neutro Abs: 4.6 10*3/uL (ref 1.4–7.7)
PLATELETS: 144 10*3/uL — AB (ref 150.0–400.0)
RBC: 4.57 Mil/uL (ref 4.22–5.81)
RDW: 15.5 % (ref 11.5–15.5)
WBC: 6.4 10*3/uL (ref 4.0–10.5)

## 2015-08-28 LAB — LIPID PANEL
CHOLESTEROL: 184 mg/dL (ref 0–200)
HDL: 40.6 mg/dL (ref >39.00)
LDL CALC: 129 mg/dL — AB (ref 0–99)
NonHDL: 143.78
TRIGLYCERIDES: 76 mg/dL (ref 0.0–149.0)
Total CHOL/HDL Ratio: 5
VLDL: 15.2 mg/dL (ref 0.0–40.0)

## 2015-08-28 LAB — POC URINALSYSI DIPSTICK (AUTOMATED)
BILIRUBIN UA: NEGATIVE
Blood, UA: NEGATIVE
Glucose, UA: NEGATIVE
KETONES UA: NEGATIVE
LEUKOCYTES UA: NEGATIVE
NITRITE UA: NEGATIVE
PH UA: 7
Protein, UA: NEGATIVE
Spec Grav, UA: 1.015
Urobilinogen, UA: 0.2

## 2015-08-28 LAB — HEPATIC FUNCTION PANEL
ALBUMIN: 3.7 g/dL (ref 3.5–5.2)
ALT: 13 U/L (ref 0–53)
AST: 14 U/L (ref 0–37)
Alkaline Phosphatase: 54 U/L (ref 39–117)
BILIRUBIN TOTAL: 1.1 mg/dL (ref 0.2–1.2)
Bilirubin, Direct: 0.2 mg/dL (ref 0.0–0.3)
Total Protein: 6 g/dL (ref 6.0–8.3)

## 2015-08-28 LAB — TSH: TSH: 3.18 u[IU]/mL (ref 0.35–4.50)

## 2015-08-28 LAB — HEMOGLOBIN A1C: HEMOGLOBIN A1C: 6.5 % (ref 4.6–6.5)

## 2015-08-28 LAB — PSA: PSA: 0.68 ng/mL (ref 0.10–4.00)

## 2015-08-28 LAB — MICROALBUMIN / CREATININE URINE RATIO
CREATININE, U: 71.1 mg/dL
MICROALB UR: 1.5 mg/dL (ref 0.0–1.9)
Microalb Creat Ratio: 2.1 mg/g (ref 0.0–30.0)

## 2015-08-28 NOTE — Telephone Encounter (Signed)
Referral was done  

## 2015-08-28 NOTE — Addendum Note (Signed)
Addended by: [name] A on: 08/28/2015 09:17 PM   Modules accepted: Orders

## 2015-08-28 NOTE — Telephone Encounter (Signed)
Pt would like to have an referral to and Endocrinologist.

## 2015-08-29 ENCOUNTER — Telehealth: Payer: Self-pay | Admitting: Interventional Cardiology

## 2015-08-29 MED ORDER — FUROSEMIDE 40 MG PO TABS: 40.0000 mg | ORAL_TABLET | Freq: Every day | ORAL | Status: DC

## 2015-08-29 NOTE — Telephone Encounter (Signed)
New Message  Pt call requesting to speak with RN   Pt c/o Shortness Of Breath: STAT if SOB developed within the last 24 hours or pt is noticeably SOB on the phone  1. Are you currently SOB (can you hear that pt is SOB on the phone)? Pt states no  2. How long have you been experiencing SOB?pt states 2 days  3. Are you SOB when sitting or when up moving around? Moving around;  4. Are you currently experiencing any other symptoms? No

## 2015-08-29 NOTE — Telephone Encounter (Signed)
I left a voice message with below information. 

## 2015-08-29 NOTE — Telephone Encounter (Signed)
Pt states that for the last 2 days he has been having SOB at rest and with activity. Pt states that his PCP D/C his lasix on 08/17/15, and pt does not know why. No other symptoms. Richardson Dopp PA is aware of pt's symptoms, and recommends for pt to go to the ER. If pt thinks that his SOB is not that bad to start the lasix and make an appointment to be seen here in this office in 2 days. Pt states that it was not that bad to go to the ER. Pt  Said that  he took his Lasix 40 mg a few minutes ago. Offered to pt an appointment in this office an 2 or 3 days. Pt states has  an appointment with Dr. Rolly Salter on 09/04/15. Pt is aware to keep appointment.

## 2015-09-04 ENCOUNTER — Ambulatory Visit (INDEPENDENT_AMBULATORY_CARE_PROVIDER_SITE_OTHER): Payer: PPO | Admitting: Family Medicine

## 2015-09-04 ENCOUNTER — Encounter: Payer: PPO | Attending: Physical Medicine & Rehabilitation | Admitting: Physical Medicine & Rehabilitation

## 2015-09-04 ENCOUNTER — Encounter: Payer: Self-pay | Admitting: Physical Medicine & Rehabilitation

## 2015-09-04 ENCOUNTER — Encounter: Payer: Self-pay | Admitting: Family Medicine

## 2015-09-04 VITALS — BP 102/55 | HR 63 | Resp 17

## 2015-09-04 VITALS — BP 120/69 | HR 64 | Temp 98.2°F

## 2015-09-04 DIAGNOSIS — Z23 Encounter for immunization: Secondary | ICD-10-CM | POA: Diagnosis not present

## 2015-09-04 DIAGNOSIS — Z0000 Encounter for general adult medical examination without abnormal findings: Secondary | ICD-10-CM | POA: Diagnosis not present

## 2015-09-04 DIAGNOSIS — G546 Phantom limb syndrome with pain: Secondary | ICD-10-CM

## 2015-09-04 DIAGNOSIS — M79672 Pain in left foot: Secondary | ICD-10-CM | POA: Insufficient documentation

## 2015-09-04 DIAGNOSIS — S78112A Complete traumatic amputation at level between left hip and knee, initial encounter: Secondary | ICD-10-CM

## 2015-09-04 DIAGNOSIS — Z89612 Acquired absence of left leg above knee: Secondary | ICD-10-CM | POA: Diagnosis not present

## 2015-09-04 MED ORDER — OXYCODONE-ACETAMINOPHEN 10-325 MG PO TABS: 1.0000 | ORAL_TABLET | ORAL | Status: DC | PRN

## 2015-09-04 MED ORDER — TAPENTADOL HCL 50 MG PO TABS: 50.0000 mg | ORAL_TABLET | Freq: Four times a day (QID) | ORAL | Status: DC | PRN

## 2015-09-04 NOTE — Progress Notes (Signed)
   Subjective:    Patient ID: [name], male    DOB: November 18, 1934, 81 y.o.   MRN: [id number]  HPI 81 yr old male for a well exam. He feels well in general although his diabetic neuropathy and phantom limb pain are quite bothersome. He still sees Dr. Naaman Plummer regularly.    Review of Systems  Constitutional: Negative.   HENT: Negative.   Eyes: Negative.   Respiratory: Negative.   Cardiovascular: Negative.   Gastrointestinal: Negative.   Genitourinary: Negative.   Musculoskeletal: Negative.   Skin: Negative.   Neurological: Negative.   Psychiatric/Behavioral: Negative.        Objective:   Physical Exam  Constitutional: He is oriented to person, place, and time. He appears well-developed and well-nourished. No distress.  HENT:  Head: Normocephalic and atraumatic.  Right Ear: External ear normal.  Left Ear: External ear normal.  Nose: Nose normal.  Mouth/Throat: Oropharynx is clear and moist. No oropharyngeal exudate.  Eyes: Conjunctivae and EOM are normal. Pupils are equal, round, and reactive to light. Right eye exhibits no discharge. Left eye exhibits no discharge. No scleral icterus.  Neck: Neck supple. No JVD present. No tracheal deviation present. No thyromegaly present.  Cardiovascular: Normal rate, regular rhythm, normal heart sounds and intact distal pulses.  Exam reveals no gallop and no friction rub.   No murmur heard. Pulmonary/Chest: Effort normal and breath sounds normal. No respiratory distress. He has no wheezes. He has no rales. He exhibits no tenderness.  Abdominal: Soft. Bowel sounds are normal. He exhibits no distension and no mass. There is no tenderness. There is no rebound and no guarding.  Genitourinary: Rectum normal, prostate normal and penis normal. Guaiac negative stool. No penile tenderness.  Musculoskeletal: Normal range of motion. He exhibits no edema or tenderness.  Lymphadenopathy:    He has no cervical adenopathy.  Neurological: He is alert  and oriented to person, place, and time. He has normal reflexes. No cranial nerve deficit. He exhibits normal muscle tone. Coordination normal.  Skin: Skin is warm and dry. No rash noted. He is not diaphoretic. No erythema. No pallor.  Psychiatric: He has a normal mood and affect. His behavior is normal. Judgment and thought content normal.          Assessment & Plan:  Well exam. We discussed diet and exercise.  Laurey Morale, MD

## 2015-09-04 NOTE — Progress Notes (Signed)
Pre visit review using our clinic review tool, if applicable. No additional management support is needed unless otherwise documented below in the visit note. Pt unable to weigh 

## 2015-09-04 NOTE — Patient Instructions (Signed)
PLEASE CALL ME WITH ANY PROBLEMS OR QUESTIONS ([phone])  

## 2015-09-04 NOTE — Progress Notes (Signed)
Subjective:    Patient ID: [name], male    DOB: 1934-07-01, 81 y.o.   MRN: [id number]  HPI   [name] is here in follow up of his chronic pain. His phantom pain has persisted. It's particularly bad at night. He never "heard" form Dr. Isaiah Blakes, and he never called this office to let us know.  He maintains on the gabapentin without issues. It does provide some relief. He also uses percocet which does help as well.   He has had some recent CV issues after stopping his lasix. Symptoms improved after he resumed the medicine. He is following up with Dr. Sharlene Motts later today in this regard.   Pain Inventory Average Pain 4 Pain Right Now 6 My pain is sharp and stabbing  In the last 24 hours, has pain interfered with the following? General activity 1 Relation with others 3 Enjoyment of life 6 What TIME of day is your pain at its worst? evening, night Sleep (in general) NA  Pain is worse with: sitting and some activites Pain improves with: medication Relief from Meds: 7  Mobility use a cane use a walker how many minutes can you walk? 3 ability to climb steps?  yes do you drive?  yes transfers alone Do you have any goals in this area?  yes  Function Do you have any goals in this area?  no  Neuro/Psych bladder control problems trouble walking depression  Prior Studies Any changes since last visit?  no  Physicians involved in your care Any changes since last visit?  no   Family History  Problem Relation Age of Onset  . Cancer      breast/fhx  . Heart disease      fhx  . Diabetes Neg Hx   . Cancer Father    Social History   Social History  . Marital Status: Widowed    Spouse Name: N/A  . Number of Children: N/A  . Years of Education: N/A   Occupational History  . Retired    Social History Main Topics  . Smoking status: Current Some Day Smoker    Types: Cigars  . Smokeless tobacco: Never Used     Comment: 1 cigar each month  . Alcohol Use: 0.0  oz/week    0 Standard drinks or equivalent per week     Comment: once a week wine  . Drug Use: No  . Sexual Activity: Not Currently   Other Topics Concern  . None   Social History Narrative   Lives in [place] with significant other,  [name].   Past Surgical History  Procedure Laterality Date  . Turp vaporization    . Cardiac defibrillator placement  12/26/09    pacemaker combo  . Cervical epidural injection  2013  . Femoral-tibial bypass graft  09/25/2011    Procedure: BYPASS GRAFT FEMORAL-TIBIAL ARTERY;  Surgeon: Mal Misty, MD;  Location: [facility] OR;  Service: Vascular;  Laterality: Left;  Left Femoral - Anterior Tibial Bypass;  saphenous vein graft from left leg  . Intraoperative arteriogram  09/25/2011    Procedure: INTRA OPERATIVE ARTERIOGRAM;  Surgeon: Mal Misty, MD;  Location: [place];  Service: Vascular;  Laterality: Left;  . Femoral-tibial bypass graft  02/07/2012    Procedure: BYPASS GRAFT FEMORAL-TIBIAL ARTERY;  Surgeon: Rosetta Posner, MD;  Location: [place];  Service: Vascular;  Laterality: Left;  Thrombectomy Left Femoral - Tibial Bypass Graft  . Embolectomy  02/07/2012    Procedure:  EMBOLECTOMY;  Surgeon: Rosetta Posner, MD;  Location: [place];  Service: Vascular;  Laterality: Left;  . Femoral-tibial bypass graft  04/03/2012    Procedure: BYPASS GRAFT FEMORAL-TIBIAL ARTERY;  Surgeon: Mal Misty, MD;  Location: [place];  Service: Vascular;  Laterality: Left;  Redo  . Insert / replace / remove pacemaker  2007  . Coronary angioplasty with stent placement  ~ 2000  . Coronary angioplasty    . Uvulopalatopharyngoplasty (uppp)/tonsillectomy/septoplasty  06/30/2003    Archie Endo 06/30/2003 (07/10/2012)  . Shoulder open rotator cuff repair Right 2001    repair of lacerated right/notes 10/11/1999  (07/10/2012)  . Foot surgery Right 03/20/2001    "have plates and screws in; didn't break it" (07/10/2012)  . Carpal tunnel release Right 2002    Archie Endo 03/20/2001 (07/10/2012)  . Biceps tendon repair  Right 2001    Archie Endo 03/20/2001 (07/10/2012)  . Renal artery stent  2009  . Femoral-popliteal bypass graft Left 09/16/2012    Procedure: LEFT FEMORAL-POPLITEAL BYPASS GRAFT WITH GORTEX Propaten Graft 6x80 Thin Wall and Left lower leg Angiogram;  Surgeon: Mal Misty, MD;  Location: [place];  Service: Vascular;  Laterality: Left;  . Colonoscopy      Hx; of  . Tonsillectomy    . Adenoidectomy      Hx; of   . Femoral-tibial bypass graft Left 09/30/2012    Procedure: REDO LEFT FEMORAL-ANTERIOR TIBIAL ARTERY BYPASS USING COMPOSITE CEPHALIC AND BASILIC VEIN GRAFT FROM LEFT ARM;  Surgeon: Mal Misty, MD;  Location: [place];  Service: Vascular;  Laterality: Left;  . I&d extremity Left 10/21/2012    Procedure: EXPLORATION AND DEBRIDEMENT OF LEFT GROIN WOUND;  Surgeon: Mal Misty, MD;  Location: [place];  Service: Vascular;  Laterality: Left;  . Patch angioplasty Left 10/21/2012    Procedure: PATCH ANGIOPLASTY;  Surgeon: Mal Misty, MD;  Location: [place];  Service: Vascular;  Laterality: Left;  . Groin debridement Left 11/12/2012    Procedure: CLOSURE INGUINAL WOUND;  Surgeon: Mal Misty, MD;  Location: [place];  Service: Vascular;  Laterality: Left;  . Embolectomy Left 12/16/2012    Procedure: THROMBECTOMY  LEFT LEG BYPASS;  Surgeon: Elam Dutch, MD;  Location: [place];  Service: Vascular;  Laterality: Left;  . Leg amputation above knee Left 02/04/2013    DR LAWSON  . Amputation Left 02/04/2013    Procedure: AMPUTATION ABOVE KNEE-LEFT;  Surgeon: Mal Misty, MD;  Location: [place];  Service: Vascular;  Laterality: Left;  . Removal of graft Left 04/16/2013    Procedure: I & D LEFT AKA WOUND, POSSIBLE REMOVAL OF INFECTED GORTEX GRAFT;  Surgeon: Mal Misty, MD;  Location: [place];  Service: Vascular;  Laterality: Left;  . Abdominal aortagram N/A 09/11/2011    Procedure: ABDOMINAL Maxcine Ham;  Surgeon: Wellington Hampshire, MD;  Location: [facility] CATH LAB;  Service: Cardiovascular;  Laterality: N/A;  .  Lower extremity angiogram Left 12/25/2011    Procedure: LOWER EXTREMITY ANGIOGRAM;  Surgeon: Serafina Mitchell, MD;  Location: [facility] CATH LAB;  Service: Cardiovascular;  Laterality: Left;  lt leg angio co2  . Abdominal angiogram  12/25/2011    Procedure: ABDOMINAL ANGIOGRAM;  Surgeon: Serafina Mitchell, MD;  Location: [facility] CATH LAB;  Service: Cardiovascular;;  . Lower extremity angiogram N/A 07/07/2012    Procedure: LOWER EXTREMITY ANGIOGRAM;  Surgeon: Conrad [place], MD;  Location: [facility] CATH LAB;  Service: Cardiovascular;  Laterality: N/A;  . Lower extremity angiogram N/A  09/15/2012    Procedure: LOWER EXTREMITY ANGIOGRAM;  Surgeon: Serafina Mitchell, MD;  Location: [facility] CATH LAB;  Service: Cardiovascular;  Laterality: N/A;  . Upper extremity angiogram  09/15/2012    Procedure: UPPER EXTREMITY ANGIOGRAM;  Surgeon: Serafina Mitchell, MD;  Location: [facility] CATH LAB;  Service: Cardiovascular;;  . Biv icd genertaor change out N/A 11/09/2013    Procedure: BIV ICD GENERTAOR CHANGE OUT;  Surgeon: Coralyn Mark, MD;  Location: [facility] CATH LAB;  Service: Cardiovascular;  Laterality: N/A;   Past Medical History  Diagnosis Date  . Gout   . Benign prostatic hypertrophy     takes Proscar daily  . Atrial fibrillation ([place])     takes Warfarin daily  . Chronic systolic dysfunction of left ventricle     a. mixed ischemic and nonischemic CM,  EF 35%. b. s/p AICD implantation.  . ED (erectile dysfunction)   . Arthritis   . Pacemaker     medtronic  . CAD (coronary artery disease)     a. s/p mid LAD stenting with DES 2008 Dr. Daneen Schick  . ICD (implantable cardiac defibrillator) in place     medtronic, Dr. Rayann Heman  . ICD (implantable cardiac defibrillator) in place   . Sleep apnea     hx of "had surgery for"  . Automatic implantable cardioverter-defibrillator in situ   . Depression   . Numbness and tingling     Hx; 23f left foot  . PAD (peripheral artery disease) (HCC)     Severe by PV angiogram 09/2011  . Renal artery stenosis  ([place])     s/p stenting 2009  . PAD (peripheral artery disease) (HCC)     s/p multiple LLE bypass grafts; left mid-distal SCA occlusion by 08/2012 duplex  . Hypertension     takes Carvedilol and Losartan daily  . Constipation     takes Miralax daily as needed and Colace daily   . Type II diabetes mellitus (HCC)     takes Novolog 70/30  . CHF (congestive heart failure) (HCC)     takes Lasix daily  . History of MRSA infection   . Insomnia     TAKES TRAZODONE NIGHTLY   BP 102/55 mmHg  Pulse 63  Resp 17  SpO2 97%  Opioid Risk Score:   Fall Risk Score:  `1  Depression screen PHQ 2/9  Depression screen [facility] 2/9 07/10/2015 05/09/2015 11/23/2014 06/03/2014  Decreased Interest 0 2 2 2   Down, Depressed, Hopeless 0 1 1 1   PHQ - 2 Score 0 3 3 3   Altered sleeping - - - 3  Tired, decreased energy - - - 2  Change in appetite - - - 0  Feeling bad or failure about yourself  - - - 0  Trouble concentrating - - - 1  Moving slowly or fidgety/restless - - - 0  Suicidal thoughts - - - 0  PHQ-9 Score - - - 9     Review of Systems  Constitutional:       Bladder control problems  Respiratory: Positive for cough and wheezing.   Musculoskeletal: Positive for gait problem.  Psychiatric/Behavioral: Positive for dysphoric mood.  All other systems reviewed and are negative.      Objective:   Physical Exam  Constitutional: He is oriented to person, place, and time. He appears well-developed and well-nourished.  HENT:  Head: Normocephalic and atraumatic.  Eyes: Conjunctivae are normal. Pupils are equal, round, and reactive to light.  Neck: Neck supple.  Cardiovascular: Normal rate and regular rhythm. No rubs, gallops. Pacer site dressed  No murmur heard.  Respiratory: Effort normal and breath sounds normal. No respiratory distress. He has no wheezes.  GI: Soft. Bowel sounds are normal. He exhibits no distension. There is no tenderness.  Musculoskeletal:  L-AKA prosthesis socket in place and  appears to be fitting appropriately  Neurological: He is alert and oriented to person, place, and time.  Follows commands. Strength grossly intact in UE's at 5/5. RLE is 5/5 prox to 5/5 distally. Left HF 5/5. mild sensory loss in distal right leg/foot.  Skin: Intact.  Psychiatric: He has a normal mood and affect. He is a little flat.  Judgment and thought content normal. Cognition and memory are normal.  Assessment/Plan:  1. Functional deficits secondary to left AKA.  -continue with PT---needs to improve stamina, balance, and confidence  2. Phantom limb pain- -continue gabapentin 300mg  tid. Hasn't been able to tolerate a higher dose or other anticonvulsants. -consider trileptal trial Cannot use TCA's due to his CV history  -continue percocet to 10/325 to help with current pain and with prosthetic training. #60, 2nd RF for next month  -nucynta is an option but will not be on his drug formulary---gave him a script of #10.   -will check on  Referral to Dr. Isaiah Blakes  for sympathetic ganglion block for LLE   3. Follow up with me in about 2 months. 15 minutes of face to face patient care time were spent during this visit. All questions were encouraged and answered.

## 2015-09-11 ENCOUNTER — Other Ambulatory Visit: Payer: Self-pay | Admitting: *Deleted

## 2015-09-11 ENCOUNTER — Encounter (HOSPITAL_COMMUNITY): Payer: PPO

## 2015-09-11 ENCOUNTER — Ambulatory Visit (HOSPITAL_COMMUNITY)
Admission: RE | Admit: 2015-09-11 | Discharge: 2015-09-11 | Disposition: A | Discharge status: Home / Self Care | Payer: PPO | Source: Ambulatory Visit | Attending: Family | Admitting: Family

## 2015-09-11 DIAGNOSIS — Z48812 Encounter for surgical aftercare following surgery on the circulatory system: Secondary | ICD-10-CM | POA: Diagnosis not present

## 2015-09-11 DIAGNOSIS — E119 Type 2 diabetes mellitus without complications: Secondary | ICD-10-CM | POA: Insufficient documentation

## 2015-09-11 DIAGNOSIS — G4730 Sleep apnea, unspecified: Secondary | ICD-10-CM | POA: Insufficient documentation

## 2015-09-11 DIAGNOSIS — G4700 Insomnia, unspecified: Secondary | ICD-10-CM | POA: Diagnosis not present

## 2015-09-11 DIAGNOSIS — I2510 Atherosclerotic heart disease of native coronary artery without angina pectoris: Secondary | ICD-10-CM | POA: Insufficient documentation

## 2015-09-11 DIAGNOSIS — R0989 Other specified symptoms and signs involving the circulatory and respiratory systems: Secondary | ICD-10-CM | POA: Diagnosis not present

## 2015-09-11 DIAGNOSIS — F329 Major depressive disorder, single episode, unspecified: Secondary | ICD-10-CM | POA: Insufficient documentation

## 2015-09-11 DIAGNOSIS — I509 Heart failure, unspecified: Secondary | ICD-10-CM | POA: Diagnosis not present

## 2015-09-11 DIAGNOSIS — I110 Hypertensive heart disease with heart failure: Secondary | ICD-10-CM | POA: Diagnosis not present

## 2015-09-11 DIAGNOSIS — I739 Peripheral vascular disease, unspecified: Secondary | ICD-10-CM

## 2015-09-11 DIAGNOSIS — I6523 Occlusion and stenosis of bilateral carotid arteries: Secondary | ICD-10-CM

## 2015-09-13 ENCOUNTER — Encounter: Payer: Self-pay | Admitting: Endocrinology

## 2015-09-14 ENCOUNTER — Encounter: Payer: Self-pay | Admitting: Family

## 2015-09-19 ENCOUNTER — Ambulatory Visit (INDEPENDENT_AMBULATORY_CARE_PROVIDER_SITE_OTHER): Payer: PPO | Admitting: Family

## 2015-09-19 ENCOUNTER — Encounter: Payer: Self-pay | Admitting: Family

## 2015-09-19 VITALS — BP 138/68 | HR 61 | Temp 97.3°F | Resp 18 | Ht 67.25 in | Wt 184.0 lb

## 2015-09-19 DIAGNOSIS — Z89612 Acquired absence of left leg above knee: Secondary | ICD-10-CM | POA: Diagnosis not present

## 2015-09-19 DIAGNOSIS — E1151 Type 2 diabetes mellitus with diabetic peripheral angiopathy without gangrene: Secondary | ICD-10-CM | POA: Diagnosis not present

## 2015-09-19 DIAGNOSIS — I779 Disorder of arteries and arterioles, unspecified: Secondary | ICD-10-CM | POA: Diagnosis not present

## 2015-09-19 DIAGNOSIS — S78112A Complete traumatic amputation at level between left hip and knee, initial encounter: Secondary | ICD-10-CM

## 2015-09-19 NOTE — Progress Notes (Addendum)
[facility] [place]   CC: Follow up peripheral artery occlusive disease  History of Present Illness [name] is a 81 y.o. male patient of Dr. 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   He has no new complaints. He continues to have phantom pain in the left AKA stump and is followed by Dr. Naaman Plummer. He is on gabapentin and percocet for pain control. He continues to take coumadin daily and has had a pace maker replaced  due to old pace maker battery dying.  He is here today for follow up right LE ABI studies. He reports no opeinngs in the skin, no right foot pain, or edema. He has had a history of claudication in the left upper extremity with numbness and tingling in the left hand. He has known left subclavian occlusion that is asymptomatic.   He denies any non healing wounds or ulcers. After walking about 30 feet with his walker, his right calf hurts, relieved by 30 seconds rest.  His son is with him today.  Pt Diabetic: Yes, pt states his last A1C was 6.5, improved from 7.9 Pt smoker: never smoker  Pt meds include: Statin :simvastatin Betablocker: Yes ASA: No Other anticoagulants/antiplatelets: warfarin      Past Medical History  Diagnosis Date  . Gout   . Benign prostatic hypertrophy     takes Proscar daily  . Atrial fibrillation ([place])     takes Warfarin daily  . Chronic systolic dysfunction of left ventricle     a. mixed ischemic and nonischemic CM,  EF 35%. b. s/p AICD implantation.  . ED (erectile dysfunction)   . Arthritis   . Pacemaker     medtronic  . CAD (coronary artery disease)     a. s/p mid LAD stenting with DES 2008 Dr. Daneen Schick  . ICD (implantable cardiac defibrillator) in place     medtronic, Dr. Rayann Heman  . ICD (implantable cardiac defibrillator) in place   . Sleep apnea     hx of "had surgery for"  . Automatic implantable cardioverter-defibrillator in situ   . Depression   . Numbness and tingling     Hx; 66f left  foot  . PAD (peripheral artery disease) (HCC)     Severe by PV angiogram 09/2011  . Renal artery stenosis ([place])     s/p stenting 2009  . PAD (peripheral artery disease) (HCC)     s/p multiple LLE bypass grafts; left mid-distal SCA occlusion by 08/2012 duplex  . Hypertension     takes Carvedilol and Losartan daily  . Constipation     takes Miralax daily as needed and Colace daily   . Type II diabetes mellitus (HCC)     takes Novolog 70/30  . CHF (congestive heart failure) (HCC)     takes Lasix daily  . History of MRSA infection   . Insomnia     TAKES TRAZODONE NIGHTLY    Social History Social History  Substance Use Topics  . Smoking status: Current Some Day Smoker    Types: Cigars  . Smokeless tobacco: Never Used     Comment: 1 cigar each month  . Alcohol Use: 0.0 oz/week    0 Standard drinks or equivalent per week     Comment: once a week wine    Family History Family History  Problem Relation Age of Onset  . Cancer      breast/fhx  . Heart disease      fhx  .  Diabetes Neg Hx   . Cancer Father     Past Surgical History  Procedure Laterality Date  . Turp vaporization    . Cardiac defibrillator placement  12/26/09    pacemaker combo  . Cervical epidural injection  2013  . Femoral-tibial bypass graft  09/25/2011    Procedure: BYPASS GRAFT FEMORAL-TIBIAL ARTERY;  Surgeon: Mal Misty, MD;  Location: [facility] OR;  Service: Vascular;  Laterality: Left;  Left Femoral - Anterior Tibial Bypass;  saphenous vein graft from left leg  . Intraoperative arteriogram  09/25/2011    Procedure: INTRA OPERATIVE ARTERIOGRAM;  Surgeon: Mal Misty, MD;  Location: [place];  Service: Vascular;  Laterality: Left;  . Femoral-tibial bypass graft  02/07/2012    Procedure: BYPASS GRAFT FEMORAL-TIBIAL ARTERY;  Surgeon: Rosetta Posner, MD;  Location: [place];  Service: Vascular;  Laterality: Left;  Thrombectomy Left Femoral - Tibial Bypass Graft  . Embolectomy  02/07/2012    Procedure: EMBOLECTOMY;   Surgeon: Rosetta Posner, MD;  Location: [place];  Service: Vascular;  Laterality: Left;  . Femoral-tibial bypass graft  04/03/2012    Procedure: BYPASS GRAFT FEMORAL-TIBIAL ARTERY;  Surgeon: Mal Misty, MD;  Location: [place];  Service: Vascular;  Laterality: Left;  Redo  . Insert / replace / remove pacemaker  2007  . Coronary angioplasty with stent placement  ~ 2000  . Coronary angioplasty    . Uvulopalatopharyngoplasty (uppp)/tonsillectomy/septoplasty  06/30/2003    Archie Endo 06/30/2003 (07/10/2012)  . Shoulder open rotator cuff repair Right 2001    repair of lacerated right/notes 10/11/1999  (07/10/2012)  . Foot surgery Right 03/20/2001    "have plates and screws in; didn't break it" (07/10/2012)  . Carpal tunnel release Right 2002    Archie Endo 03/20/2001 (07/10/2012)  . Biceps tendon repair Right 2001    Archie Endo 03/20/2001 (07/10/2012)  . Renal artery stent  2009  . Femoral-popliteal bypass graft Left 09/16/2012    Procedure: LEFT FEMORAL-POPLITEAL BYPASS GRAFT WITH GORTEX Propaten Graft 6x80 Thin Wall and Left lower leg Angiogram;  Surgeon: Mal Misty, MD;  Location: [place];  Service: Vascular;  Laterality: Left;  . Colonoscopy      Hx; of  . Tonsillectomy    . Adenoidectomy      Hx; of   . Femoral-tibial bypass graft Left 09/30/2012    Procedure: REDO LEFT FEMORAL-ANTERIOR TIBIAL ARTERY BYPASS USING COMPOSITE CEPHALIC AND BASILIC VEIN GRAFT FROM LEFT ARM;  Surgeon: Mal Misty, MD;  Location: [place];  Service: Vascular;  Laterality: Left;  . I&d extremity Left 10/21/2012    Procedure: EXPLORATION AND DEBRIDEMENT OF LEFT GROIN WOUND;  Surgeon: Mal Misty, MD;  Location: [place];  Service: Vascular;  Laterality: Left;  . Patch angioplasty Left 10/21/2012    Procedure: PATCH ANGIOPLASTY;  Surgeon: Mal Misty, MD;  Location: [place];  Service: Vascular;  Laterality: Left;  . Groin debridement Left 11/12/2012    Procedure: CLOSURE INGUINAL WOUND;  Surgeon: Mal Misty, MD;  Location: [place];  Service:  Vascular;  Laterality: Left;  . Embolectomy Left 12/16/2012    Procedure: THROMBECTOMY  LEFT LEG BYPASS;  Surgeon: Elam Dutch, MD;  Location: [place];  Service: Vascular;  Laterality: Left;  . Leg amputation above knee Left 02/04/2013    DR LAWSON  . Amputation Left 02/04/2013    Procedure: AMPUTATION ABOVE KNEE-LEFT;  Surgeon: Mal Misty, MD;  Location: [place];  Service: Vascular;  Laterality: Left;  .  Removal of graft Left 04/16/2013    Procedure: I & D LEFT AKA WOUND, POSSIBLE REMOVAL OF INFECTED GORTEX GRAFT;  Surgeon: Mal Misty, MD;  Location: [place];  Service: Vascular;  Laterality: Left;  . Abdominal aortagram N/A 09/11/2011    Procedure: ABDOMINAL Maxcine Ham;  Surgeon: Wellington Hampshire, MD;  Location: [facility] CATH LAB;  Service: Cardiovascular;  Laterality: N/A;  . Lower extremity angiogram Left 12/25/2011    Procedure: LOWER EXTREMITY ANGIOGRAM;  Surgeon: Serafina Mitchell, MD;  Location: [facility] CATH LAB;  Service: Cardiovascular;  Laterality: Left;  lt leg angio co2  . Abdominal angiogram  12/25/2011    Procedure: ABDOMINAL ANGIOGRAM;  Surgeon: Serafina Mitchell, MD;  Location: [facility] CATH LAB;  Service: Cardiovascular;;  . Lower extremity angiogram N/A 07/07/2012    Procedure: LOWER EXTREMITY ANGIOGRAM;  Surgeon: Conrad Mayer, MD;  Location: [facility] CATH LAB;  Service: Cardiovascular;  Laterality: N/A;  . Lower extremity angiogram N/A 09/15/2012    Procedure: LOWER EXTREMITY ANGIOGRAM;  Surgeon: Serafina Mitchell, MD;  Location: [facility] CATH LAB;  Service: Cardiovascular;  Laterality: N/A;  . Upper extremity angiogram  09/15/2012    Procedure: UPPER EXTREMITY ANGIOGRAM;  Surgeon: Serafina Mitchell, MD;  Location: [facility] CATH LAB;  Service: Cardiovascular;;  . Biv icd genertaor change out N/A 11/09/2013    Procedure: BIV ICD GENERTAOR CHANGE OUT;  Surgeon: Coralyn Mark, MD;  Location: [facility] CATH LAB;  Service: Cardiovascular;  Laterality: N/A;    Allergies  Allergen Reactions  . Other Other (See Comments)     Plastic tape tears skin  . Bactrim Ds [Sulfamethoxazole-Trimethoprim] Other (See Comments)    Hand tremors    Current Outpatient Prescriptions  Medication Sig Dispense Refill  . allopurinol (ZYLOPRIM) 100 MG tablet Take 1 tablet (100 mg total) by mouth daily as needed (Gout). 90 tablet 3  . amLODipine (NORVASC) 5 MG tablet Take 1 tablet (5 mg total) by mouth daily. 90 tablet 3  . carvedilol (COREG) 25 MG tablet Take 1 tablet (25 mg total) by mouth 2 (two) times daily with a meal. 180 tablet 3  . finasteride (PROSCAR) 5 MG tablet Take 1 tablet (5 mg total) by mouth daily. 90 tablet 3  . furosemide (LASIX) 40 MG tablet Take 1 tablet (40 mg total) by mouth daily.    Marland Kitchen gabapentin (NEURONTIN) 300 MG capsule Take 2 capsules (600 mg total) by mouth 3 (three) times daily. 540 capsule 3  . insulin aspart protamine- aspart (NOVOLOG MIX 70/30) (70-30) 100 UNIT/ML injection Inject 7 Units into the skin 2 (two) times daily with a meal.    . Insulin Pen Needle 31G X 8 MM MISC Use 2-3 times per day and diagnosis code is 250.00 100 each 1  . losartan (COZAAR) 100 MG tablet Take 1 tablet (100 mg total) by mouth daily. 90 tablet 3  . nitroGLYCERIN (NITROSTAT) 0.4 MG SL tablet Place 1 tablet (0.4 mg total) under the tongue every 5 (five) minutes as needed for chest pain. For chest pain, max 3 doses 90 tablet 3  . ONE TOUCH ULTRA TEST test strip USE TO TEST BLOOD SUGAR TWO TIMES DAILY AS INSTRUCTED BY PHYSICIAN. 100 each 0  . warfarin (COUMADIN) 5 MG tablet TAKE AS DIRECTED BY ANTICOAGULATION CLINIC (Patient taking differently: 2.5 mg. TAKE AS DIRECTED BY ANTICOAGULATION CLINIC) 60 tablet 1  . oxyCODONE-acetaminophen (PERCOCET) 10-325 MG tablet Take 1 tablet by mouth every 4 (four) hours as needed for pain. (Patient not  taking: Reported on 09/19/2015) 60 tablet 0  . sildenafil (VIAGRA) 100 MG tablet Take as directed (Patient not taking: Reported on 09/04/2015) 5 tablet 0  . tapentadol (NUCYNTA) 50 MG TABS tablet Take  1 tablet (50 mg total) by mouth every 6 (six) hours as needed. (Patient not taking: Reported on 09/04/2015) 15 tablet 0   No current facility-administered medications for this visit.    ROS: See HPI for pertinent positives and negatives.   Physical Examination  Filed Vitals:   09/19/15 0948  BP: 138/68  Pulse: 61  Temp: 97.3 F (36.3 C)  TempSrc: Oral  Resp: 18  Height: 5' 7.25" (1.708 m)  Weight: 184 lb (83.462 kg)  SpO2: 97%   Body mass index is 28.61 kg/(m^2).  General: A&O x 3, WDWN, in wheelchair. Pulmonary: Respirations are non labored Cardiac: regular Rhythm   VASCULAR EXAM: Extremities without ischemic changes, without Gangrene; no open wounds. Left AKA, wearing prosthesis.      LE Pulses LEFT RIGHT   FEMORAL  palpable  palpable    POPLITEAL AKA  not palpable   POSTERIOR TIBIAL AKA  not palpable    DORSALIS PEDIS  ANTERIOR TIBIAL AKA  not palpable    Abdomen: soft, NT, no palpable masses. Skin: no rashes, no ulcers. Musculoskeletal: no muscle wasting or atrophy. Neurologic: A&O X 3; Appropriate Affect ; SENSATION: normal; MOTOR FUNCTION: moving all extremities equally. Speech is fluent/normal. CN 2-12 grossly intact.                Non-Invasive Vascular Imaging: DATE: 09/11/15  ABI:  RIGHT: 0.31 (0.20, 07/10/15), Waveforms: monophasic; TBI: absent   LEFT: AKA    ASSESSMENT: [name] is a 81 y.o. male  s/p left AKA Feb 04, 2013. On 04/16/2013 he was taken to the OR for an infected sinus tract of his left AKA stump, an I&D was performed.  His left AKA stump has remained well healed, he is wearing his prosthesis. He has no signs of ischemia in his right foot/leg. 06/16/14 carotid duplex at [facility] indicated minimal bilateral ICA  stenosies.   Right ABI on 09/11/15 indicates severe arterial occlusive disease, improved from critical limb ischemia in May 2017.  Pt's primary atherosclerotic risk factor remains his DM, currently in control. Fortunately he has never used tobacco.   PLAN:  Graduated walking program and daily seated exercises discussed and demonstrated. Based on the patient's vascular studies and examination, pt will return to clinic in 6 months with right ABI. I advised him to notify us if he develops a non healing wound in his right leg/foot or if the pain in his leg becomes worse.  I discussed in depth with the patient the nature of atherosclerosis, and emphasized the importance of maximal medical management including strict control of blood pressure, blood glucose, and lipid levels, obtaining regular exercise, and continued cessation of smoking.  The patient is aware that without maximal medical management the underlying atherosclerotic disease process will progress, limiting the benefit of any interventions.  The patient was given information about PAD including signs, symptoms, treatment, what symptoms should prompt the patient to seek immediate medical care, and risk reduction measures to take.  Clemon Chambers, RN, MSN, FNP-C [facility] Phone: [phone]  Clinic MD: Early  09/19/2015 10:11 AM

## 2015-09-19 NOTE — Patient Instructions (Signed)
Peripheral Vascular Disease Peripheral vascular disease (PVD) is a disease of the blood vessels that are not part of your heart and brain. A simple term for PVD is poor circulation. In most cases, PVD narrows the blood vessels that carry blood from your heart to the rest of your body. This can result in a decreased supply of blood to your arms, legs, and internal organs, like your stomach or kidneys. However, it most often affects a person's lower legs and feet. There are two types of PVD.  Organic PVD. This is the more common type. It is caused by damage to the structure of blood vessels.  Functional PVD. This is caused by conditions that make blood vessels contract and tighten (spasm). Without treatment, PVD tends to get worse over time. PVD can also lead to acute ischemic limb. This is when an arm or limb suddenly has trouble getting enough blood. This is a medical emergency. CAUSES Each type of PVD has many different causes. The most common cause of PVD is buildup of a fatty material (plaque) inside of your arteries (atherosclerosis). Small amounts of plaque can break off from the walls of the blood vessels and become lodged in a smaller artery. This blocks blood flow and can cause acute ischemic limb. Other common causes of PVD include:  Blood clots that form inside of blood vessels.  Injuries to blood vessels.  Diseases that cause inflammation of blood vessels or cause blood vessel spasms.  Health behaviors and health history that increase your risk of developing PVD. RISK FACTORS  You may have a greater risk of PVD if you:  Have a family history of PVD.  Have certain medical conditions, including:  High cholesterol.  Diabetes.  High blood pressure (hypertension).  Coronary heart disease.  Past problems with blood clots.  Past injury, such as burns or a broken bone. These may have damaged blood vessels in your limbs.  Buerger disease. This is caused by inflamed blood  vessels in your hands and feet.  Some forms of arthritis.  Rare birth defects that affect the arteries in your legs.  Use tobacco.  Do not get enough exercise.  Are obese.  Are age 50 or older. SIGNS AND SYMPTOMS  PVD may cause many different symptoms. Your symptoms depend on what part of your body is not getting enough blood. Some common signs and symptoms include:  Cramps in your lower legs. This may be a symptom of poor leg circulation (claudication).  Pain and weakness in your legs while you are physically active that goes away when you rest (intermittent claudication).  Leg pain when at rest.  Leg numbness, tingling, or weakness.  Coldness in a leg or foot, especially when compared with the other leg.  Skin or hair changes. These can include:  Hair loss.  Shiny skin.  Pale or bluish skin.  Thick toenails.  Inability to get or maintain an erection (erectile dysfunction). People with PVD are more prone to developing ulcers and sores on their toes, feet, or legs. These may take longer than normal to heal. DIAGNOSIS Your health care provider may diagnose PVD from your signs and symptoms. The health care provider will also do a physical exam. You may have tests to find out what is causing your PVD and determine its severity. Tests may include:  Blood pressure recordings from your arms and legs and measurements of the strength of your pulses (pulse volume recordings).  Imaging studies using sound waves to take pictures of   the blood flow through your blood vessels (Doppler ultrasound).  Injecting a dye into your blood vessels before having imaging studies using:  X-rays (angiogram or arteriogram).  Computer-generated X-rays (CT angiogram).  A powerful electromagnetic field and a computer (magnetic resonance angiogram or MRA). TREATMENT Treatment for PVD depends on the cause of your condition and the severity of your symptoms. It also depends on your age. Underlying  causes need to be treated and controlled. These include long-lasting (chronic) conditions, such as diabetes, high cholesterol, and high blood pressure. You may need to first try making lifestyle changes and taking medicines. Surgery may be needed if these do not work. Lifestyle changes may include:  Quitting smoking.  Exercising regularly.  Following a low-fat, low-cholesterol diet. Medicines may include:  Blood thinners to prevent blood clots.  Medicines to improve blood flow.  Medicines to improve your blood cholesterol levels. Surgical procedures may include:  A procedure that uses an inflated balloon to open a blocked artery and improve blood flow (angioplasty).  A procedure to put in a tube (stent) to keep a blocked artery open (stent implant).  Surgery to reroute blood flow around a blocked artery (peripheral bypass surgery).  Surgery to remove dead tissue from an infected wound on the affected limb.  Amputation. This is surgical removal of the affected limb. This may be necessary in cases of acute ischemic limb that are not improved through medical or surgical treatments. HOME CARE INSTRUCTIONS  Take medicines only as directed by your health care provider.  Do not use any tobacco products, including cigarettes, chewing tobacco, or electronic cigarettes. If you need help quitting, ask your health care provider.  Lose weight if you are overweight, and maintain a healthy weight as directed by your health care provider.  Eat a diet that is low in fat and cholesterol. If you need help, ask your health care provider.  Exercise regularly. Ask your health care provider to suggest some good activities for you.  Use compression stockings or other mechanical devices as directed by your health care provider.  Take good care of your feet.  Wear comfortable shoes that fit well.  Check your feet often for any cuts or sores. SEEK MEDICAL CARE IF:  You have cramps in your legs  while walking.  You have leg pain when you are at rest.  You have coldness in a leg or foot.  Your skin changes.  You have erectile dysfunction.  You have cuts or sores on your feet that are not healing. SEEK IMMEDIATE MEDICAL CARE IF:  Your arm or leg turns cold and blue.  Your arms or legs become red, warm, swollen, painful, or numb.  You have chest pain or trouble breathing.  You suddenly have weakness in your face, arm, or leg.  You become very confused or lose the ability to speak.  You suddenly have a very bad headache or lose your vision.   This information is not intended to replace advice given to you by your health care provider. Make sure you discuss any questions you have with your health care provider.   Document Released: 03/28/2004 Document Revised: 03/11/2014 Document Reviewed: 07/29/2013 Elsevier Interactive Patient Education 2016 [employer]  

## 2015-09-25 ENCOUNTER — Encounter: Payer: PPO | Admitting: *Deleted

## 2015-09-25 ENCOUNTER — Telehealth: Payer: Self-pay | Admitting: Cardiology

## 2015-09-25 ENCOUNTER — Ambulatory Visit (INDEPENDENT_AMBULATORY_CARE_PROVIDER_SITE_OTHER): Payer: PPO | Admitting: General Practice

## 2015-09-25 DIAGNOSIS — I4891 Unspecified atrial fibrillation: Secondary | ICD-10-CM | POA: Diagnosis not present

## 2015-09-25 DIAGNOSIS — I482 Chronic atrial fibrillation, unspecified: Secondary | ICD-10-CM

## 2015-09-25 LAB — POCT INR: INR: 2.9

## 2015-09-25 NOTE — Telephone Encounter (Signed)
LMOVM reminding pt to send remote transmission.   

## 2015-09-26 ENCOUNTER — Telehealth: Payer: Self-pay | Admitting: Interventional Cardiology

## 2015-09-26 NOTE — Telephone Encounter (Signed)
New message         1. Has your device fired? no  2. Is you device beeping?no  3. Are you experiencing draining or swelling at device site? no  4. Are you calling to see if we received your device transmission? The pt wants to make sure the signal is down loading 5. Have you passed out? no

## 2015-09-26 NOTE — Telephone Encounter (Signed)
Spoke w/ pt and informed him that his remote transmission was not received. Attempted to help pt trouble shoot his home monitor. After several unsuccessful attempts I instructed pt to call tech services. Pt verbalized understanding.

## 2015-09-28 NOTE — Progress Notes (Signed)
No ICM remote transmission received that was scheduled on 09/25/2015.  ICM remote transmission scheduled for 10/23/2015.

## 2015-09-29 ENCOUNTER — Encounter: Payer: Self-pay | Admitting: Cardiology

## 2015-10-10 ENCOUNTER — Ambulatory Visit (INDEPENDENT_AMBULATORY_CARE_PROVIDER_SITE_OTHER): Payer: PPO | Admitting: Adult Health

## 2015-10-10 ENCOUNTER — Ambulatory Visit (INDEPENDENT_AMBULATORY_CARE_PROVIDER_SITE_OTHER): Payer: PPO

## 2015-10-10 ENCOUNTER — Encounter: Payer: Self-pay | Admitting: Adult Health

## 2015-10-10 ENCOUNTER — Telehealth: Payer: Self-pay | Admitting: Internal Medicine

## 2015-10-10 VITALS — HR 86 | Temp 97.5°F | Ht 67.25 in | Wt 181.8 lb

## 2015-10-10 DIAGNOSIS — M1A4710 Other secondary chronic gout, right ankle and foot, without tophus (tophi): Secondary | ICD-10-CM

## 2015-10-10 DIAGNOSIS — I5022 Chronic systolic (congestive) heart failure: Secondary | ICD-10-CM | POA: Diagnosis not present

## 2015-10-10 DIAGNOSIS — Z95810 Presence of automatic (implantable) cardiac defibrillator: Secondary | ICD-10-CM

## 2015-10-10 MED ORDER — PREDNISONE 10 MG PO TABS: ORAL_TABLET | ORAL | 0 refills | Status: DC

## 2015-10-10 NOTE — Patient Instructions (Addendum)
It was great meeting you today   I have sent in a prescription for prednisone. Take as directed:   40 mg x 4 days 20 mg x 3 days 10 mg x 2 days  Follow up if no improvement  Gout Gout is an inflammatory arthritis caused by a buildup of uric acid crystals in the joints. Uric acid is a chemical that is normally present in the blood. When the level of uric acid in the blood is too high it can form crystals that deposit in your joints and tissues. This causes joint redness, soreness, and swelling (inflammation). Repeat attacks are common. Over time, uric acid crystals can form into masses (tophi) near a joint, destroying bone and causing disfigurement. Gout is treatable and often preventable. CAUSES  The disease begins with elevated levels of uric acid in the blood. Uric acid is produced by your body when it breaks down a naturally found substance called purines. Certain foods you eat, such as meats and fish, contain high amounts of purines. Causes of an elevated uric acid level include:  Being passed down from parent to child (heredity).  Diseases that cause increased uric acid production (such as obesity, psoriasis, and certain cancers).  Excessive alcohol use.  Diet, especially diets rich in meat and seafood.  Medicines, including certain cancer-fighting medicines (chemotherapy), water pills (diuretics), and aspirin.  Chronic kidney disease. The kidneys are no longer able to remove uric acid well.  Problems with metabolism. Conditions strongly associated with gout include:  Obesity.  High blood pressure.  High cholesterol.  Diabetes. Not everyone with elevated uric acid levels gets gout. It is not understood why some people get gout and others do not. Surgery, joint injury, and eating too much of certain foods are some of the factors that can lead to gout attacks. SYMPTOMS   An attack of gout comes on quickly. It causes intense pain with redness, swelling, and warmth in a  joint.  Fever can occur.  Often, only one joint is involved. Certain joints are more commonly involved:  Base of the big toe.  Knee.  Ankle.  Wrist.  Finger. Without treatment, an attack usually goes away in a few days to weeks. Between attacks, you usually will not have symptoms, which is different from many other forms of arthritis. DIAGNOSIS  Your caregiver will suspect gout based on your symptoms and exam. In some cases, tests may be recommended. The tests may include:  Blood tests.  Urine tests.  X-rays.  Joint fluid exam. This exam requires a needle to remove fluid from the joint (arthrocentesis). Using a microscope, gout is confirmed when uric acid crystals are seen in the joint fluid. TREATMENT  There are two phases to gout treatment: treating the sudden onset (acute) attack and preventing attacks (prophylaxis).  Treatment of an Acute Attack.  Medicines are used. These include anti-inflammatory medicines or steroid medicines.  An injection of steroid medicine into the affected joint is sometimes necessary.  The painful joint is rested. Movement can worsen the arthritis.  You may use warm or cold treatments on painful joints, depending which works best for you.  Treatment to Prevent Attacks.  If you suffer from frequent gout attacks, your caregiver may advise preventive medicine. These medicines are started after the acute attack subsides. These medicines either help your kidneys eliminate uric acid from your body or decrease your uric acid production. You may need to stay on these medicines for a very long time.  The early phase  of treatment with preventive medicine can be associated with an increase in acute gout attacks. For this reason, during the first few months of treatment, your caregiver may also advise you to take medicines usually used for acute gout treatment. Be sure you understand your caregiver's directions. Your caregiver may make several adjustments  to your medicine dose before these medicines are effective.  Discuss dietary treatment with your caregiver or dietitian. Alcohol and drinks high in sugar and fructose and foods such as meat, poultry, and seafood can increase uric acid levels. Your caregiver or dietitian can advise you on drinks and foods that should be limited. HOME CARE INSTRUCTIONS   Do not take aspirin to relieve pain. This raises uric acid levels.  Only take over-the-counter or prescription medicines for pain, discomfort, or fever as directed by your caregiver.  Rest the joint as much as possible. When in bed, keep sheets and blankets off painful areas.  Keep the affected joint raised (elevated).  Apply warm or cold treatments to painful joints. Use of warm or cold treatments depends on which works best for you.  Use crutches if the painful joint is in your leg.  Drink enough fluids to keep your urine clear or pale yellow. This helps your body get rid of uric acid. Limit alcohol, sugary drinks, and fructose drinks.  Follow your dietary instructions. Pay careful attention to the amount of protein you eat. Your daily diet should emphasize fruits, vegetables, whole grains, and fat-free or low-fat milk products. Discuss the use of coffee, vitamin C, and cherries with your caregiver or dietitian. These may be helpful in lowering uric acid levels.  Maintain a healthy body weight. SEEK MEDICAL CARE IF:   You develop diarrhea, vomiting, or any side effects from medicines.  You do not feel better in 24 hours, or you are getting worse. SEEK IMMEDIATE MEDICAL CARE IF:   Your joint becomes suddenly more tender, and you have chills or a fever. MAKE SURE YOU:   Understand these instructions.  Will watch your condition.  Will get help right away if you are not doing well or get worse.   This information is not intended to replace advice given to you by your health care provider. Make sure you discuss any questions you have  with your health care provider.   Document Released: 02/16/2000 Document Revised: 03/11/2014 Document Reviewed: 10/02/2011 Elsevier Interactive Patient Education [employer].

## 2015-10-10 NOTE — Telephone Encounter (Signed)
Called pt back and let him know that we did not receive his transmission. Walked pt through transmission and an error code. Informed pt that he would need to call [employer] tech services to further troubleshoot his monitor. Pt voiced understanding.

## 2015-10-10 NOTE — Progress Notes (Signed)
   Subjective:    Patient ID: [name], male    DOB: 1934-07-02, 81 y.o.   MRN: [id number]  HPI  81 year old male who presents to the office today for right foot pain > 1 week. He reports that his heel and right great toe has been red, swollen, and extremely tender to touch. He is taking Allopurinol daily but feels as though this is a gout flare.   Denies any fevers or feeling acutely ill.   Review of Systems  Constitutional: Negative.   Respiratory: Negative.   Cardiovascular: Negative.   Gastrointestinal: Negative.   Musculoskeletal: Positive for joint swelling.  Skin: Positive for color change.  All other systems reviewed and are negative.      Objective:   Physical Exam  Constitutional: He is oriented to person, place, and time. He appears well-developed and well-nourished. No distress.  Cardiovascular: Normal rate, regular rhythm, normal heart sounds and intact distal pulses.  Exam reveals no gallop and no friction rub.   No murmur heard. Neurological: He is alert and oriented to person, place, and time.  Skin: Skin is warm and dry. No rash noted. He is not diaphoretic. There is erythema. No pallor.  Red, warm, swollen and painful to touch right great toe.  Warmness and pain to right heel Left AKA with prostetic  Psychiatric: He has a normal mood and affect. His behavior is normal. Judgment and thought content normal.  Nursing note and vitals reviewed.     Assessment & Plan:  1. 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

## 2015-10-10 NOTE — Telephone Encounter (Signed)
New message ° ° ° ° ° °1. Has your device fired? no °2. Is you device beeping? no ° °3. Are you experiencing draining or swelling at device site? no ° °4. Are you calling to see if we received your device transmission?yes °5. Have you passed out? no °

## 2015-10-10 NOTE — Progress Notes (Signed)
EPIC Encounter for ICM Monitoring  Patient Name: [name] is a 81 y.o. male Date: 10/10/2015 Primary Care Physican: Laurey Morale, MD Primary Cardiologist: Tamala Julian Electrophysiologist: Allred Dry Weight: Unable to weigh-amputee Bi-V Pacing:  99.8%       Heart Failure questions reviewed, pt asymptomatic   Thoracic impedance abnormal suggesting fluid accumulation 10/04/2015 and very close to baseline on 10/10/2015.  Patient reported he did not take about 2-3 days of Furosemide during decreased impedance due to he was traveling.  He has resumed prescribed dosage.   Recommendations: No changes since patient is close to baseline and has resumed prescribed dosage of Furosemide. Advised to follow low sodium diet and explained most foods already have salt even.    ICM trend: 10/10/2015     Follow-up plan: ICM clinic phone appointment on 11/10/2015.  Copy of ICM check sent to primary cardiologist and device physician.   Rosalene Billings, RN 10/10/2015 11:04 AM

## 2015-10-10 NOTE — Progress Notes (Signed)
Pre visit review using our clinic review tool, if applicable. No additional management support is needed unless otherwise documented below in the visit note. 

## 2015-10-19 ENCOUNTER — Telehealth: Payer: Self-pay | Admitting: Family Medicine

## 2015-10-19 MED ORDER — COLCHICINE 0.6 MG PO TABS: ORAL_TABLET | ORAL | 2 refills | Status: DC

## 2015-10-19 MED ORDER — COLCHICINE 0.6 MG PO TABS: 0.6000 mg | ORAL_TABLET | Freq: Two times a day (BID) | ORAL | 2 refills | Status: DC

## 2015-10-19 NOTE — Telephone Encounter (Signed)
Pt left a voice message, he was recently here in the office for righ foot heel pain. It is going getting better and would like to kow what else he can do.

## 2015-10-19 NOTE — Telephone Encounter (Signed)
I spoke with pt  

## 2015-10-19 NOTE — Telephone Encounter (Signed)
Rx sent 

## 2015-10-19 NOTE — Telephone Encounter (Signed)
Add Colchicine 0.6 mg to take bid prn gout, call in #[phone] with 2 rf

## 2015-10-23 ENCOUNTER — Telehealth: Payer: Self-pay | Admitting: *Deleted

## 2015-10-23 NOTE — Telephone Encounter (Signed)
Patient called with gout flare up. Dr. Hollace Kinnier prednisone which did not help and then Colchicine which also doesn't seem to be helping. Patient to call and make appointment. Phoned patient and asked if he had made an appointment yet. He wanted to wait and give more time. Stated he has appoinment tomorrow with Dr. Kellie Simmering (surgeon) and will ask him about it. Will make appointment with Dr. Sarajane Jews if no better in a few days.  PLEASE NOTE: All timestamps contained within this report are represented as [place] Standard Time. CONFIDENTIALTY NOTICE: This fax transmission is intended only for the addressee. It contains information that is legally privileged, confidential or otherwise protected from use or disclosure. If you are not the intended recipient, you are strictly prohibited from reviewing, disclosing, copying using or disseminating any of this information or taking any action in reliance on or regarding this information. If you have received this fax in error, please notify us immediately by telephone so that we can arrange for its return to [place]. Phone: [phone], Toll-Free: [phone], Fax: [phone] Page: 1 of 2 Call Id: [id number] [facility] - Client [place] Patient Name: [name] Gender: Male DOB: 09-Feb-1935 Age: 16 Y 67 M 7 D Return Phone Number: [phone] (Primary) Address: City/State/Zip: [place]  [address] Client [facility] - Client Client Site [facility] - Night Physician [name] - MD Contact Type Call Who Is Calling Patient / Member / Family / Caregiver Call Type Triage / Clinical Relationship To Patient Self Return Phone Number [phone] (Primary) Chief Complaint Leg Pain Reason for Call Request to Speak to a Physician Initial Comment Caller States wants Dr paged, has gout, meds not working, diarrhea and pain PreDisposition Call  Doctor Translation No Nurse Assessment Nurse: Denyse Amass, RN, Benjamine Mola Date/Time (Eastern Time): 10/22/2015 11:00:22 PM Confirm and document reason for call. If symptomatic, describe symptoms. You must click the next button to save text entered. ---Patient states he has gout and he saw the doctor last week and he gave him Prednisone. The patient finished the Prednisone and he did not get better. States he called the the doctor 3 days ago and the doctor prescribed Colchicine 0.6 mg twice a day. Patient states he has been taking the Colchicine for 3 days and states he has had no improvement. His right foot is still swollen and red and painful. Has the patient traveled out of the country within the last 30 days? ---Not Applicable Does the patient have any new or worsening symptoms? ---Yes Will a triage be completed? ---Yes Related visit to physician within the last 2 weeks? ---Yes Does the PT have any chronic conditions? (i.e. diabetes, asthma, etc.) ---Yes List chronic conditions. ---Diabetic HTN Gout High Cholesterol Pacemaker Is this a behavioral health or substance abuse call? ---No Guidelines Guideline Title Affirmed Question Affirmed Notes Nurse Date/Time Eilene Ghazi Time) Toe Pain [1] MODERATE pain (e.g., interferes with normal activities, limping) AND [2] present > 3 days [place], RN, Benjamine Mola 10/22/2015 11:07:43 PM PLEASE NOTE: All timestamps contained within this report are represented as [place] Standard Time. CONFIDENTIALTY NOTICE: This fax transmission is intended only for the addressee. It contains information that is legally privileged, confidential or otherwise protected from use or disclosure. If you are not the intended recipient, you are strictly prohibited from reviewing, disclosing, copying using or disseminating any of this information or taking any action in reliance on or regarding this information. If you have received this  fax in error, please notify us  immediately by telephone so that we can arrange for its return to [place]. Phone: [phone], Toll-Free: [phone], Fax: [phone] Page: 2 of 2 Call Id: [id number] [place]. Time Eilene Ghazi Time) Disposition Final User 10/22/2015 11:11:18 PM See PCP When Office is Open (within 3 days) Yes [place], RN, Lorin Glass Understands: Yes Disagree/Comply: Comply Care Advice Given Per Guideline SEE PCP WITHIN 3 DAYS: * You need to be seen within 2 or 3 days. Call your doctor during regular office hours and make an appointment. An urgent care center is often the best source of care if your doctor's office is closed or you can't get an appointment. NOTE: If office will be open tomorrow, tell caller to call then, not in 3 days. PAIN MEDICINES: * For pain relief, take acetaminophen, ibuprofen, or naproxen. * Use the lowest amount that makes your pain feel better. * Another choice is to take 1,000 mg (two 500 mg pills) every 8 hours as needed. Each Extra Strength Tylenol pill has 500 mg of acetaminophen. The most you should take each day is 3,000 mg (6 Extra Strength pills a day). CALL BACK IF: * Fever occurs * You become worse. Referrals REFERRED TO PCP OFFICE

## 2015-10-24 ENCOUNTER — Telehealth: Payer: Self-pay | Admitting: Family Medicine

## 2015-10-24 NOTE — Telephone Encounter (Signed)
ok 

## 2015-10-24 NOTE — Telephone Encounter (Signed)
[name] came in the office and dropped off a form that needs to be filled out. Upon completion, please mail it to the location listed on the envelope. The form has been placed in Dr. Barbie Banner folder.  Pt's ph# [phone] Thank you.

## 2015-10-25 DIAGNOSIS — Z45018 Encounter for adjustment and management of other part of cardiac pacemaker: Secondary | ICD-10-CM | POA: Diagnosis not present

## 2015-10-25 DIAGNOSIS — E119 Type 2 diabetes mellitus without complications: Secondary | ICD-10-CM | POA: Diagnosis not present

## 2015-10-25 DIAGNOSIS — Z89619 Acquired absence of unspecified leg above knee: Secondary | ICD-10-CM | POA: Diagnosis not present

## 2015-10-25 DIAGNOSIS — T829XXA Unspecified complication of cardiac and vascular prosthetic device, implant and graft, initial encounter: Secondary | ICD-10-CM | POA: Diagnosis not present

## 2015-10-27 ENCOUNTER — Telehealth: Payer: Self-pay | Admitting: Family Medicine

## 2015-10-27 NOTE — Telephone Encounter (Signed)
Pt called back and requesting maybe a Rheumatologist for the heel pain.

## 2015-10-27 NOTE — Telephone Encounter (Signed)
Pt left a message, he is still having heel pain and would like a referral to a Podiatrist.

## 2015-10-30 ENCOUNTER — Ambulatory Visit (INDEPENDENT_AMBULATORY_CARE_PROVIDER_SITE_OTHER): Payer: PPO | Admitting: Family Medicine

## 2015-10-30 ENCOUNTER — Encounter: Payer: Self-pay | Admitting: Family Medicine

## 2015-10-30 VITALS — BP 131/72 | HR 63 | Temp 97.7°F

## 2015-10-30 DIAGNOSIS — L03115 Cellulitis of right lower limb: Secondary | ICD-10-CM | POA: Diagnosis not present

## 2015-10-30 DIAGNOSIS — S78112A Complete traumatic amputation at level between left hip and knee, initial encounter: Secondary | ICD-10-CM

## 2015-10-30 DIAGNOSIS — Z89612 Acquired absence of left leg above knee: Secondary | ICD-10-CM

## 2015-10-30 DIAGNOSIS — G546 Phantom limb syndrome with pain: Secondary | ICD-10-CM

## 2015-10-30 DIAGNOSIS — I739 Peripheral vascular disease, unspecified: Secondary | ICD-10-CM | POA: Diagnosis not present

## 2015-10-30 MED ORDER — CEPHALEXIN 500 MG PO CAPS: 500.0000 mg | ORAL_CAPSULE | Freq: Four times a day (QID) | ORAL | 0 refills | Status: AC

## 2015-10-30 MED ORDER — OXYCODONE-ACETAMINOPHEN 10-325 MG PO TABS: 1.0000 | ORAL_TABLET | Freq: Four times a day (QID) | ORAL | 0 refills | Status: DC | PRN

## 2015-10-30 MED ORDER — CEFTRIAXONE SODIUM 1 G IJ SOLR
1.0000 g | Freq: Once | INTRAMUSCULAR | Status: AC
  Administered 2015-10-30: 1 g via INTRAMUSCULAR

## 2015-10-30 NOTE — Telephone Encounter (Signed)
Pt has been scheduled.  °

## 2015-10-30 NOTE — Progress Notes (Signed)
Pre visit review using our clinic review tool, if applicable. No additional management support is needed unless otherwise documented below in the visit note. Pt unable to weigh 

## 2015-10-30 NOTE — Telephone Encounter (Signed)
Ave him make an OV to see me. I have not evaluated the foot yet, and I need to decide what the appropriate course to take might be. Also he has not had a uric acid level checked in a long time ans this needs to be done

## 2015-10-30 NOTE — Telephone Encounter (Signed)
Can you call pt to schedule office visit? 

## 2015-10-30 NOTE — Telephone Encounter (Deleted)
Pt has been scheduled.  °

## 2015-10-30 NOTE — Progress Notes (Signed)
   Subjective:    Patient ID: [name], male    DOB: 1934/12/28, 81 y.o.   MRN: [id number]  HPI Here for 3 weeks of pain in the right foot and heel. No recent trauma. He says the heel was the worst spot and it was so tender a week ago that he could not let anything touch it. This is actually a little better today. The foot has been red but not warm. It has been slightly swollen. He has known severe PVD in the lower extremities. His diabetes has been well controlled and his last A1c in June was 6.5%. He was seen here on 10-10-15 and it was determined that he was having a gout flare in the foot. He was started on Prednisone but things did not improve. He then tried Colchicine for a few days. This caused diarrhea but did not help his foot at all.    Review of Systems  Constitutional: Negative.   Respiratory: Negative.   Cardiovascular: Negative.   Skin: Positive for color change.  Neurological: Negative.        Objective:   Physical Exam  Constitutional: He appears well-developed and well-nourished.  Cardiovascular: Normal rate, regular rhythm, normal heart sounds and intact distal pulses.   Pulmonary/Chest: Effort normal and breath sounds normal.  Skin:  He has absent pulses in the right foot. The skin is red and cool. The entire foot is slightly swollen. He is tender over the heel and along the forefoot. The only broken skin I can see is between the 4th and 5th toes, where he has some breakdown.           Assessment & Plan:  His foot pain is not from gout so I told him to stop taking Colchicine. He has cellulitis and he is given a shot of Rocephin today. We will follow this with 10 days of Keflex 500 mg QID. The main underlying issue is his severe PVD and ischemia to the foot. Use Percocet at night for pain. Recheck in one week.  Laurey Morale, MD

## 2015-10-31 NOTE — Telephone Encounter (Signed)
Pt was seen here in the office today 10/30/2015.

## 2015-11-01 ENCOUNTER — Telehealth: Payer: Self-pay | Admitting: Internal Medicine

## 2015-11-01 NOTE — Telephone Encounter (Signed)
C/o right foot pain and says he saw PCP yesterday and was given and antibiotic to treat this. Also c/o wheezing multiple times today. Patient said that the wheezing has stopped now. No prior h/o asthma or bronchitis. No c/o coughing, congestion,  fever, chills, n/v, sob, dizziness, or chest pain. No wheezing heard while on phone. Patient advised to contact his PCP r/e wheezing. Patient verbalized understanding of plan.

## 2015-11-01 NOTE — Telephone Encounter (Signed)
New Message  Pt voiced he's having wheezing in chest and would like to speak with a nurse.  Please follow up with pt. Thanks!

## 2015-11-03 ENCOUNTER — Ambulatory Visit (INDEPENDENT_AMBULATORY_CARE_PROVIDER_SITE_OTHER): Payer: PPO | Admitting: Family

## 2015-11-03 ENCOUNTER — Ambulatory Visit (HOSPITAL_COMMUNITY)
Admission: RE | Admit: 2015-11-03 | Discharge: 2015-11-03 | Disposition: A | Discharge status: Home / Self Care | Payer: PPO | Source: Ambulatory Visit | Attending: Vascular Surgery | Admitting: Vascular Surgery

## 2015-11-03 ENCOUNTER — Encounter: Payer: Self-pay | Admitting: Family

## 2015-11-03 ENCOUNTER — Telehealth: Payer: Self-pay

## 2015-11-03 ENCOUNTER — Telehealth: Payer: Self-pay | Admitting: Family Medicine

## 2015-11-03 ENCOUNTER — Other Ambulatory Visit: Payer: Self-pay

## 2015-11-03 VITALS — BP 145/69 | HR 60 | Temp 98.1°F | Resp 16 | Ht 67.0 in | Wt 178.0 lb

## 2015-11-03 DIAGNOSIS — S78112A Complete traumatic amputation at level between left hip and knee, initial encounter: Secondary | ICD-10-CM

## 2015-11-03 DIAGNOSIS — Z89612 Acquired absence of left leg above knee: Secondary | ICD-10-CM

## 2015-11-03 DIAGNOSIS — M79676 Pain in unspecified toe(s): Secondary | ICD-10-CM

## 2015-11-03 DIAGNOSIS — L97511 Non-pressure chronic ulcer of other part of right foot limited to breakdown of skin: Secondary | ICD-10-CM

## 2015-11-03 DIAGNOSIS — M79609 Pain in unspecified limb: Secondary | ICD-10-CM

## 2015-11-03 DIAGNOSIS — I739 Peripheral vascular disease, unspecified: Secondary | ICD-10-CM | POA: Insufficient documentation

## 2015-11-03 DIAGNOSIS — E119 Type 2 diabetes mellitus without complications: Secondary | ICD-10-CM | POA: Diagnosis not present

## 2015-11-03 DIAGNOSIS — I779 Disorder of arteries and arterioles, unspecified: Secondary | ICD-10-CM | POA: Diagnosis not present

## 2015-11-03 DIAGNOSIS — I10 Essential (primary) hypertension: Secondary | ICD-10-CM | POA: Diagnosis not present

## 2015-11-03 NOTE — Patient Instructions (Signed)
Peripheral Vascular Disease Peripheral vascular disease (PVD) is a disease of the blood vessels that are not part of your heart and brain. A simple term for PVD is poor circulation. In most cases, PVD narrows the blood vessels that carry blood from your heart to the rest of your body. This can result in a decreased supply of blood to your arms, legs, and internal organs, like your stomach or kidneys. However, it most often affects a person's lower legs and feet. There are two types of PVD.  Organic PVD. This is the more common type. It is caused by damage to the structure of blood vessels.  Functional PVD. This is caused by conditions that make blood vessels contract and tighten (spasm). Without treatment, PVD tends to get worse over time. PVD can also lead to acute ischemic limb. This is when an arm or limb suddenly has trouble getting enough blood. This is a medical emergency. CAUSES Each type of PVD has many different causes. The most common cause of PVD is buildup of a fatty material (plaque) inside of your arteries (atherosclerosis). Small amounts of plaque can break off from the walls of the blood vessels and become lodged in a smaller artery. This blocks blood flow and can cause acute ischemic limb. Other common causes of PVD include:  Blood clots that form inside of blood vessels.  Injuries to blood vessels.  Diseases that cause inflammation of blood vessels or cause blood vessel spasms.  Health behaviors and health history that increase your risk of developing PVD. RISK FACTORS  You may have a greater risk of PVD if you:  Have a family history of PVD.  Have certain medical conditions, including:  High cholesterol.  Diabetes.  High blood pressure (hypertension).  Coronary heart disease.  Past problems with blood clots.  Past injury, such as burns or a broken bone. These may have damaged blood vessels in your limbs.  Buerger disease. This is caused by inflamed blood  vessels in your hands and feet.  Some forms of arthritis.  Rare birth defects that affect the arteries in your legs.  Use tobacco.  Do not get enough exercise.  Are obese.  Are age 50 or older. SIGNS AND SYMPTOMS  PVD may cause many different symptoms. Your symptoms depend on what part of your body is not getting enough blood. Some common signs and symptoms include:  Cramps in your lower legs. This may be a symptom of poor leg circulation (claudication).  Pain and weakness in your legs while you are physically active that goes away when you rest (intermittent claudication).  Leg pain when at rest.  Leg numbness, tingling, or weakness.  Coldness in a leg or foot, especially when compared with the other leg.  Skin or hair changes. These can include:  Hair loss.  Shiny skin.  Pale or bluish skin.  Thick toenails.  Inability to get or maintain an erection (erectile dysfunction). People with PVD are more prone to developing ulcers and sores on their toes, feet, or legs. These may take longer than normal to heal. DIAGNOSIS Your health care provider may diagnose PVD from your signs and symptoms. The health care provider will also do a physical exam. You may have tests to find out what is causing your PVD and determine its severity. Tests may include:  Blood pressure recordings from your arms and legs and measurements of the strength of your pulses (pulse volume recordings).  Imaging studies using sound waves to take pictures of   the blood flow through your blood vessels (Doppler ultrasound).  Injecting a dye into your blood vessels before having imaging studies using:  X-rays (angiogram or arteriogram).  Computer-generated X-rays (CT angiogram).  A powerful electromagnetic field and a computer (magnetic resonance angiogram or MRA). TREATMENT Treatment for PVD depends on the cause of your condition and the severity of your symptoms. It also depends on your age. Underlying  causes need to be treated and controlled. These include long-lasting (chronic) conditions, such as diabetes, high cholesterol, and high blood pressure. You may need to first try making lifestyle changes and taking medicines. Surgery may be needed if these do not work. Lifestyle changes may include:  Quitting smoking.  Exercising regularly.  Following a low-fat, low-cholesterol diet. Medicines may include:  Blood thinners to prevent blood clots.  Medicines to improve blood flow.  Medicines to improve your blood cholesterol levels. Surgical procedures may include:  A procedure that uses an inflated balloon to open a blocked artery and improve blood flow (angioplasty).  A procedure to put in a tube (stent) to keep a blocked artery open (stent implant).  Surgery to reroute blood flow around a blocked artery (peripheral bypass surgery).  Surgery to remove dead tissue from an infected wound on the affected limb.  Amputation. This is surgical removal of the affected limb. This may be necessary in cases of acute ischemic limb that are not improved through medical or surgical treatments. HOME CARE INSTRUCTIONS  Take medicines only as directed by your health care provider.  Do not use any tobacco products, including cigarettes, chewing tobacco, or electronic cigarettes. If you need help quitting, ask your health care provider.  Lose weight if you are overweight, and maintain a healthy weight as directed by your health care provider.  Eat a diet that is low in fat and cholesterol. If you need help, ask your health care provider.  Exercise regularly. Ask your health care provider to suggest some good activities for you.  Use compression stockings or other mechanical devices as directed by your health care provider.  Take good care of your feet.  Wear comfortable shoes that fit well.  Check your feet often for any cuts or sores. SEEK MEDICAL CARE IF:  You have cramps in your legs  while walking.  You have leg pain when you are at rest.  You have coldness in a leg or foot.  Your skin changes.  You have erectile dysfunction.  You have cuts or sores on your feet that are not healing. SEEK IMMEDIATE MEDICAL CARE IF:  Your arm or leg turns cold and blue.  Your arms or legs become red, warm, swollen, painful, or numb.  You have chest pain or trouble breathing.  You suddenly have weakness in your face, arm, or leg.  You become very confused or lose the ability to speak.  You suddenly have a very bad headache or lose your vision.   This information is not intended to replace advice given to you by your health care provider. Make sure you discuss any questions you have with your health care provider.   Document Released: 03/28/2004 Document Revised: 03/11/2014 Document Reviewed: 07/29/2013 Elsevier Interactive Patient Education 2016 [employer]  

## 2015-11-03 NOTE — Telephone Encounter (Signed)
Spoke with Dr. Yong Channel and after reviewing chart, he found the patient to be appropriate to hold coumadin for the 5 days before procedure. Called [facility] and they are aware.

## 2015-11-03 NOTE — Telephone Encounter (Signed)
Called pt. and advised that Dr. Barbie Banner office called and authorized pt. to hold Warfarin 5 days prior to procedure.  Encouraged the pt. to seek emergency care if he experiences any ill feeling with dizziness, weakness, difficulty with speech, etc.  Pt. Verb. Understanding.

## 2015-11-03 NOTE — Progress Notes (Signed)
[facility] [place]   CC: Follow up peripheral artery occlusive disease  History of Present Illness [name] is a 81 y.o. male patient of Dr. 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  He has a history of phantom pain in the left AKA stump and is followed by Dr. Naaman Plummer. He is on gabapentin and percocet for pain control. He continues to take coumadin daily and has had a pace maker replaced due to old pace maker battery dying.  He is here today after phone call to triage nurse with [facility]: He has had pain in the right foot and toes for about 10 days. He reports redness and drainage from right great toe and small toe.  Reported "there may be some swelling; I can't tell."   Denied fever/ chills.  Stated he saw his PCP on Monday, and was given an antibiotic injection, and Keflex, but doesn't think it is any better.  Requested an appt. With Nurse Practitioner today, if possible.   He has had a history of claudication in the left upper extremity with numbness and tingling in the left hand. He has known left subclavian occlusion that is asymptomatic.   After walking about 30 feet with his walker, his right calf hurts, relieved by 30 seconds rest. He gets around in a motorized scooter.   Pt Diabetic: Yes, pt states his last A1C was 6.5, improved from 7.9 Pt smoker: never smoker  Pt meds include: Statin :simvastatin Betablocker: Yes ASA: No Other anticoagulants/antiplatelets: warfarin     Past Medical History:  Diagnosis Date  . Arthritis   . Atrial fibrillation ([place])    takes Warfarin daily  . Automatic implantable cardioverter-defibrillator in situ   . Benign prostatic hypertrophy    takes Proscar daily  . CAD (coronary artery disease)    a. s/p mid LAD stenting with DES 2008 Dr. Daneen Schick  . CHF (congestive heart failure) ([place])    takes Lasix daily  . Chronic systolic dysfunction of left ventricle    a. mixed ischemic and nonischemic CM,  EF 35%.  b. s/p AICD implantation.  . Constipation    takes Miralax daily as needed and Colace daily   . Depression   . ED (erectile dysfunction)   . Gout   . History of MRSA infection   . Hypertension    takes Carvedilol and Losartan daily  . ICD (implantable cardiac defibrillator) in place    medtronic, Dr. Rayann Heman  . ICD (implantable cardiac defibrillator) in place   . Insomnia    TAKES TRAZODONE NIGHTLY  . Numbness and tingling    Hx; 67f left foot  . Pacemaker    medtronic  . PAD (peripheral artery disease) (H[facility])    Severe by PV angiogram 09/2011  . PAD (peripheral artery disease) (H[facility])    s/p multiple LLE bypass grafts; left mid-distal SCA occlusion by 08/2012 duplex  . Renal artery stenosis ([place])    s/p stenting 2009  . Sleep apnea    hx of "had surgery for"  . Type II diabetes mellitus ([place])    takes Novolog 70/30    Social History Social History  Substance Use Topics  . Smoking status: Current Some Day Smoker    Types: Cigars  . Smokeless tobacco: Never Used     Comment: 1 cigar each month  . Alcohol use 0.0 oz/week     Comment: once a week wine    Family History Family History  Problem Relation Age of Onset  . Cancer      breast/fhx  . Heart disease      fhx  . Diabetes Neg Hx   . Cancer Father     Past Surgical History:  Procedure Laterality Date  . ABDOMINAL ANGIOGRAM  12/25/2011   Procedure: ABDOMINAL ANGIOGRAM;  Surgeon: Serafina Mitchell, MD;  Location: [facility] CATH LAB;  Service: Cardiovascular;;  . ABDOMINAL AORTAGRAM N/A 09/11/2011   Procedure: ABDOMINAL Maxcine Ham;  Surgeon: Wellington Hampshire, MD;  Location: [place] CATH LAB;  Service: Cardiovascular;  Laterality: N/A;  . ADENOIDECTOMY     Hx; of   . AMPUTATION Left 02/04/2013   Procedure: AMPUTATION ABOVE KNEE-LEFT;  Surgeon: Mal Misty, MD;  Location: [place];  Service: Vascular;  Laterality: Left;  . BICEPS TENDON REPAIR Right 2001   Archie Endo 03/20/2001 (07/10/2012)  . BIV ICD GENERTAOR CHANGE OUT N/A 11/09/2013    Procedure: BIV ICD GENERTAOR CHANGE OUT;  Surgeon: Coralyn Mark, MD;  Location: [facility] CATH LAB;  Service: Cardiovascular;  Laterality: N/A;  . CARDIAC DEFIBRILLATOR PLACEMENT  12/26/09   pacemaker combo  . CARPAL TUNNEL RELEASE Right 2002   Archie Endo 03/20/2001 (07/10/2012)  . cervical epidural injection  2013  . COLONOSCOPY     Hx; of  . CORONARY ANGIOPLASTY    . CORONARY ANGIOPLASTY WITH STENT PLACEMENT  ~ 2000  . EMBOLECTOMY  02/07/2012   Procedure: EMBOLECTOMY;  Surgeon: Rosetta Posner, MD;  Location: [place];  Service: Vascular;  Laterality: Left;  . EMBOLECTOMY Left 12/16/2012   Procedure: THROMBECTOMY  LEFT LEG BYPASS;  Surgeon: Elam Dutch, MD;  Location: [place];  Service: Vascular;  Laterality: Left;  . FEMORAL-POPLITEAL BYPASS GRAFT Left 09/16/2012   Procedure: LEFT FEMORAL-POPLITEAL BYPASS GRAFT WITH GORTEX Propaten Graft 6x80 Thin Wall and Left lower leg Angiogram;  Surgeon: Mal Misty, MD;  Location: [place];  Service: Vascular;  Laterality: Left;  . FEMORAL-TIBIAL BYPASS GRAFT  09/25/2011   Procedure: BYPASS GRAFT FEMORAL-TIBIAL ARTERY;  Surgeon: Mal Misty, MD;  Location: [facility] OR;  Service: Vascular;  Laterality: Left;  Left Femoral - Anterior Tibial Bypass;  saphenous vein graft from left leg  . FEMORAL-TIBIAL BYPASS GRAFT  02/07/2012   Procedure: BYPASS GRAFT FEMORAL-TIBIAL ARTERY;  Surgeon: Rosetta Posner, MD;  Location: [place];  Service: Vascular;  Laterality: Left;  Thrombectomy Left Femoral - Tibial Bypass Graft  . FEMORAL-TIBIAL BYPASS GRAFT  04/03/2012   Procedure: BYPASS GRAFT FEMORAL-TIBIAL ARTERY;  Surgeon: Mal Misty, MD;  Location: [place];  Service: Vascular;  Laterality: Left;  Redo  . FEMORAL-TIBIAL BYPASS GRAFT Left 09/30/2012   Procedure: REDO LEFT FEMORAL-ANTERIOR TIBIAL ARTERY BYPASS USING COMPOSITE CEPHALIC AND BASILIC VEIN GRAFT FROM LEFT ARM;  Surgeon: Mal Misty, MD;  Location: [place];  Service: Vascular;  Laterality: Left;  . FOOT SURGERY Right 03/20/2001    "have plates and screws in; didn't break it" (07/10/2012)  . GROIN DEBRIDEMENT Left 11/12/2012   Procedure: CLOSURE INGUINAL WOUND;  Surgeon: Mal Misty, MD;  Location: [place];  Service: Vascular;  Laterality: Left;  . I&D EXTREMITY Left 10/21/2012   Procedure: EXPLORATION AND DEBRIDEMENT OF LEFT GROIN WOUND;  Surgeon: Mal Misty, MD;  Location: [place];  Service: Vascular;  Laterality: Left;  . INSERT / REPLACE / REMOVE PACEMAKER  2007  . INTRAOPERATIVE ARTERIOGRAM  09/25/2011   Procedure: INTRA OPERATIVE ARTERIOGRAM;  Surgeon: Mal Misty, MD;  Location: [place];  Service: Vascular;  Laterality: Left;  . LEG AMPUTATION ABOVE KNEE Left 02/04/2013   DR LAWSON  . LOWER EXTREMITY ANGIOGRAM Left 12/25/2011   Procedure: LOWER EXTREMITY ANGIOGRAM;  Surgeon: Serafina Mitchell, MD;  Location: [facility] CATH LAB;  Service: Cardiovascular;  Laterality: Left;  lt leg angio co2  . LOWER EXTREMITY ANGIOGRAM N/A 07/07/2012   Procedure: LOWER EXTREMITY ANGIOGRAM;  Surgeon: Conrad Moab, MD;  Location: [facility] CATH LAB;  Service: Cardiovascular;  Laterality: N/A;  . LOWER EXTREMITY ANGIOGRAM N/A 09/15/2012   Procedure: LOWER EXTREMITY ANGIOGRAM;  Surgeon: Serafina Mitchell, MD;  Location: [facility] CATH LAB;  Service: Cardiovascular;  Laterality: N/A;  . PATCH ANGIOPLASTY Left 10/21/2012   Procedure: PATCH ANGIOPLASTY;  Surgeon: Mal Misty, MD;  Location: [place];  Service: Vascular;  Laterality: Left;  . REMOVAL OF GRAFT Left 04/16/2013   Procedure: I & D LEFT AKA WOUND, POSSIBLE REMOVAL OF INFECTED GORTEX GRAFT;  Surgeon: Mal Misty, MD;  Location: [place];  Service: Vascular;  Laterality: Left;  . RENAL ARTERY STENT  2009  . SHOULDER OPEN ROTATOR CUFF REPAIR Right 2001   repair of lacerated right/notes 10/11/1999  (07/10/2012)  . TONSILLECTOMY    . TURP VAPORIZATION    . UPPER EXTREMITY ANGIOGRAM  09/15/2012   Procedure: UPPER EXTREMITY ANGIOGRAM;  Surgeon: Serafina Mitchell, MD;  Location: [facility] C[facility] LAB;  Service: Cardiovascular;;   . UVULOPALATOPHARYNGOPLASTY (UPPP)/TONSILLECTOMY/SEPTOPLASTY  06/30/2003   Archie Endo 06/30/2003 (07/10/2012)    Allergies  Allergen Reactions  . Other Other (See Comments)    Plastic tape tears skin  . Bactrim Ds [Sulfamethoxazole-Trimethoprim] Other (See Comments)    Hand tremors    Current Outpatient Prescriptions  Medication Sig Dispense Refill  . allopurinol (ZYLOPRIM) 100 MG tablet Take 1 tablet (100 mg total) by mouth daily as needed (Gout). 90 tablet 3  . amLODipine (NORVASC) 5 MG tablet Take 1 tablet (5 mg total) by mouth daily. 90 tablet 3  . carvedilol (COREG) 25 MG tablet Take 1 tablet (25 mg total) by mouth 2 (two) times daily with a meal. 180 tablet 3  . cephALEXin (KEFLEX) 500 MG capsule Take 1 capsule (500 mg total) by mouth 4 (four) times daily. 40 capsule 0  . finasteride (PROSCAR) 5 MG tablet Take 1 tablet (5 mg total) by mouth daily. 90 tablet 3  . furosemide (LASIX) 40 MG tablet Take 1 tablet (40 mg total) by mouth daily.    Marland Kitchen gabapentin (NEURONTIN) 300 MG capsule Take 2 capsules (600 mg total) by mouth 3 (three) times daily. 540 capsule 3  . insulin aspart protamine- aspart (NOVOLOG MIX 70/30) (70-30) 100 UNIT/ML injection Inject 7 Units into the skin 2 (two) times daily with a meal.    . Insulin Pen Needle 31G X 8 MM MISC Use 2-3 times per day and diagnosis code is 250.00 100 each 1  . losartan (COZAAR) 100 MG tablet Take 1 tablet (100 mg total) by mouth daily. 90 tablet 3  . nitroGLYCERIN (NITROSTAT) 0.4 MG SL tablet Place 1 tablet (0.4 mg total) under the tongue every 5 (five) minutes as needed for chest pain. For chest pain, max 3 doses 90 tablet 3  . ONE TOUCH ULTRA TEST test strip USE TO TEST BLOOD SUGAR TWO TIMES DAILY AS INSTRUCTED BY PHYSICIAN. 100 each 0  . oxyCODONE-acetaminophen (PERCOCET) 10-325 MG tablet Take 1 tablet by mouth every 6 (six) hours as needed for pain. 60 tablet 0  . predniSONE (DELTASONE) 10 MG  tablet 40 mg x 4 days, 20 mg x 3 days, 10 mg x 2  days 24 tablet 0  . sildenafil (VIAGRA) 100 MG tablet Take as directed 5 tablet 0  . tapentadol (NUCYNTA) 50 MG TABS tablet Take 1 tablet (50 mg total) by mouth every 6 (six) hours as needed. 15 tablet 0  . warfarin (COUMADIN) 5 MG tablet TAKE AS DIRECTED BY ANTICOAGULATION CLINIC (Patient taking differently: 2.5 mg. TAKE AS DIRECTED BY ANTICOAGULATION CLINIC) 60 tablet 1   No current facility-administered medications for this visit.     ROS: See HPI for pertinent positives and negatives.   Physical Examination  Vitals:   11/03/15 1322  BP: (!) 145/69  Pulse: 60  Resp: 16  Temp: 98.1 F (36.7 C)  TempSrc: Oral  SpO2: 97%  Weight: 178 lb (80.7 kg)  Height: 5\' 7"  (1.702 m)   Body mass index is 27.88 kg/m.  General: A&O x 3, WDWN, in motorized scooter. Pulmonary: Respirations are non labored Cardiac: regular rhythm   VASCULAR EXAM: Extremities without ischemic changes: darkening at bases of right 3rd and 4th toes, without Gangrene; +open wound: ulcer at medial aspect of right 5th toe, oozing scant amount serosanguinous drainage. Left AKA, wearing prosthesis. Right ankle and foot are mildly ruddy with 1-2+ pitting and non pitting edema.      LE Pulses LEFT RIGHT   FEMORAL  palpable  palpable    POPLITEAL AKA  not palpable   POSTERIOR TIBIAL AKA  not palpable    DORSALIS PEDIS  ANTERIOR TIBIAL AKA  not palpable    Abdomen: soft, NT, no palpable masses. Skin: no rashes, see Extremities. Musculoskeletal: no muscle wasting or atrophy. Neurologic: A&O X 3; Appropriate Affect ; SENSATION: normal; MOTOR FUNCTION: moving all extremities equally. Speech is fluent/normal. CN 2-12 grossly intact.                Non-Invasive Vascular Imaging:    ABI:  09/11/15 RIGHT: 0.31 (0.20, 07/10/15), Waveforms: monophasic; TBI: absent   LEFT: AKA 11/03/15 Right: 0.35 with monophasic waveforms, TBI: 0.12 Left: AKA   ASSESSMENT: [name] is a 81 y.o. male who is s/p left AKA Feb 04, 2013. On 04/16/2013 he was taken to the OR for an infected sinus tract of his left AKA stump, an I&D was performed.  His left AKA stump has remained well healed, he is wearing his prosthesis.  06/16/14 carotid duplex at [facility] indicated minimal bilateral ICA stenosies.   Right ABI remains unchanged from 2 months ago when he had no open wounds, no edema, and no erythema in his right ankle and foot which he does have now. He has an ulcer on the medial aspect of his right 5th toe, oozing scant serosanguinous drainage, non purulent. Is painful. He denies injury to his right foot or toes.   Selling started yesterday, pain and redness started before he saw his PCP on 10/30/15. Reportedly he was given an antibx injection and continues PO Keflex.  He takes coumadin.   Pt's primary atherosclerotic risk factor remains his DM, currently in control. Fortunately he has never used tobacco.   PLAN:  Right 5th toe ulcer dressed with silvadene, dressing supplies given for another dressing change in a couple of days.  Based on the patient's vascular studies and examination, and after discussing with Dr. Donzetta Matters, pt will be scheduled for arteriogram on Wednesday, 11/08/15 by Dr. Donzetta Matters, right leg run off, possible intervention.  I discussed in depth with the  patient the nature of atherosclerosis, and emphasized the importance of maximal medical management including strict control of blood pressure, blood glucose, and lipid levels, obtaining regular exercise, and continued cessation of smoking.  The patient is aware that without maximal medical management the underlying atherosclerotic disease process will progress, limiting the benefit of any interventions.  The patient was  given information about PAD including signs, symptoms, treatment, what symptoms should prompt the patient to seek immediate medical care, and risk reduction measures to take.  Clemon Chambers, RN, MSN, FNP-C [facility] Phone: [phone]  Clinic MD: Donzetta Matters on call  11/03/15 1:34 PM

## 2015-11-03 NOTE — Telephone Encounter (Signed)
Phone call from pt. Reported he has had pain in the right foot toes for about 10 days.  Reported redness and drainage from right great toe and small toe.  Reported "there may be some swelling; I can't tell."   Denied fever/ chills.  Stated he saw his PCP on Monday, and was given an antibiotic injection, and Keflex, but doesn't think it is any better.  Requested an appt. With Nurse Practitioner today, if possible.  Discussed with S. Nickel, NP; was advised to obtain ABI's, and add-on for an appt. Today.

## 2015-11-03 NOTE — Telephone Encounter (Signed)
° °  Arbie Cookey from [facility] call to say they need to have the pt hold his Coumadin and will need a call back saying it is ok   Arbie Cookey  [phone]   She said have her paged

## 2015-11-07 ENCOUNTER — Encounter: Payer: PPO | Attending: Physical Medicine & Rehabilitation | Admitting: Physical Medicine & Rehabilitation

## 2015-11-07 ENCOUNTER — Encounter: Payer: Self-pay | Admitting: Physical Medicine & Rehabilitation

## 2015-11-07 VITALS — BP 131/89 | HR 67 | Resp 17

## 2015-11-07 DIAGNOSIS — Z79899 Other long term (current) drug therapy: Secondary | ICD-10-CM | POA: Diagnosis not present

## 2015-11-07 DIAGNOSIS — Z5181 Encounter for therapeutic drug level monitoring: Secondary | ICD-10-CM

## 2015-11-07 DIAGNOSIS — M79672 Pain in left foot: Secondary | ICD-10-CM | POA: Diagnosis not present

## 2015-11-07 DIAGNOSIS — E0842 Diabetes mellitus due to underlying condition with diabetic polyneuropathy: Secondary | ICD-10-CM | POA: Diagnosis not present

## 2015-11-07 DIAGNOSIS — I739 Peripheral vascular disease, unspecified: Secondary | ICD-10-CM | POA: Diagnosis not present

## 2015-11-07 DIAGNOSIS — S78112A Complete traumatic amputation at level between left hip and knee, initial encounter: Secondary | ICD-10-CM

## 2015-11-07 DIAGNOSIS — Z89612 Acquired absence of left leg above knee: Secondary | ICD-10-CM | POA: Diagnosis not present

## 2015-11-07 DIAGNOSIS — G894 Chronic pain syndrome: Secondary | ICD-10-CM | POA: Diagnosis not present

## 2015-11-07 DIAGNOSIS — G546 Phantom limb syndrome with pain: Secondary | ICD-10-CM | POA: Diagnosis not present

## 2015-11-07 MED ORDER — OXYCODONE-ACETAMINOPHEN 10-325 MG PO TABS: 1.0000 | ORAL_TABLET | Freq: Four times a day (QID) | ORAL | 0 refills | Status: DC | PRN

## 2015-11-07 MED ORDER — DULOXETINE HCL 20 MG PO CPEP: 20.0000 mg | ORAL_CAPSULE | Freq: Every day | ORAL | 2 refills | Status: DC

## 2015-11-07 NOTE — Addendum Note (Signed)
Addended by: Gara Kroner L on: 11/07/2015 03:23 PM   Modules accepted: Orders

## 2015-11-07 NOTE — Patient Instructions (Signed)
PLEASE CALL ME WITH ANY PROBLEMS OR QUESTIONS ([phone])  

## 2015-11-07 NOTE — Progress Notes (Signed)
Subjective:    Patient ID: [name], male    DOB: 1934/05/16, 81 y.o.   MRN: [id number]  HPI   [name] is here in follow up of his chronic pain. He developed pain and swelling in the right foot with a new wound which began about 2-3 weeks ago. He has an angioplasty scheduled/stent tomorrow at [place] with Dr. Bridgett Larsson. He continues with phantom pain in his left but it hasn't been quite as bad since he began to have problems with the right foot. He is having problems sleeping and states that his days and nights are "all messed up."  He admits to being depressed.   Pain Inventory Average Pain 5 Pain Right Now 7 My pain is sharp and stabbing  In the last 24 hours, has pain interfered with the following? General activity NA Relation with others NA Enjoyment of life NA What TIME of day is your pain at its worst? NA Sleep (in general) NA  Pain is worse with: NA Pain improves with: NA Relief from Meds: NA  Mobility use a cane use a walker ability to climb steps?  yes do you drive?  yes transfers alone Do you have any goals in this area?  yes  Function Do you have any goals in this area?  no  Neuro/Psych No problems in this area  Prior Studies Any changes since last visit?  no  Physicians involved in your care Any changes since last visit?  no Primary care .   Family History  Problem Relation Age of Onset  . Cancer Father   . Cancer      breast/fhx  . Heart disease      fhx  . Diabetes Neg Hx    Social History   Social History  . Marital status: Widowed    Spouse name: N/A  . Number of children: N/A  . Years of education: N/A   Occupational History  . Retired Retired   Social History Main Topics  . Smoking status: Current Some Day Smoker    Types: Cigars  . Smokeless tobacco: Never Used     Comment: 1 cigar each month  . Alcohol use 0.0 oz/week     Comment: once a week wine  . Drug use: No  . Sexual activity: Not Currently   Other  Topics Concern  . None   Social History Narrative   Lives in [place] with significant other,  [name].   Past Surgical History:  Procedure Laterality Date  . ABDOMINAL ANGIOGRAM  12/25/2011   Procedure: ABDOMINAL ANGIOGRAM;  Surgeon: Serafina Mitchell, MD;  Location: [facility] CATH LAB;  Service: Cardiovascular;;  . ABDOMINAL AORTAGRAM N/A 09/11/2011   Procedure: ABDOMINAL Maxcine Ham;  Surgeon: Wellington Hampshire, MD;  Location: [facility] CATH LAB;  Service: Cardiovascular;  Laterality: N/A;  . ADENOIDECTOMY     Hx; of   . AMPUTATION Left 02/04/2013   Procedure: AMPUTATION ABOVE KNEE-LEFT;  Surgeon: Mal Misty, MD;  Location: [place];  Service: Vascular;  Laterality: Left;  . BICEPS TENDON REPAIR Right 2001   Archie Endo 03/20/2001 (07/10/2012)  . BIV ICD GENERTAOR CHANGE OUT N/A 11/09/2013   Procedure: BIV ICD GENERTAOR CHANGE OUT;  Surgeon: Coralyn Mark, MD;  Location: [facility] CATH LAB;  Service: Cardiovascular;  Laterality: N/A;  . CARDIAC DEFIBRILLATOR PLACEMENT  12/26/09   pacemaker combo  . CARPAL TUNNEL RELEASE Right 2002   Archie Endo 03/20/2001 (07/10/2012)  . cervical epidural injection  2013  .  COLONOSCOPY     Hx; of  . CORONARY ANGIOPLASTY    . CORONARY ANGIOPLASTY WITH STENT PLACEMENT  ~ 2000  . EMBOLECTOMY  02/07/2012   Procedure: EMBOLECTOMY;  Surgeon: Rosetta Posner, MD;  Location: [place];  Service: Vascular;  Laterality: Left;  . EMBOLECTOMY Left 12/16/2012   Procedure: THROMBECTOMY  LEFT LEG BYPASS;  Surgeon: Elam Dutch, MD;  Location: [place];  Service: Vascular;  Laterality: Left;  . FEMORAL-POPLITEAL BYPASS GRAFT Left 09/16/2012   Procedure: LEFT FEMORAL-POPLITEAL BYPASS GRAFT WITH GORTEX Propaten Graft 6x80 Thin Wall and Left lower leg Angiogram;  Surgeon: Mal Misty, MD;  Location: [place];  Service: Vascular;  Laterality: Left;  . FEMORAL-TIBIAL BYPASS GRAFT  09/25/2011   Procedure: BYPASS GRAFT FEMORAL-TIBIAL ARTERY;  Surgeon: Mal Misty, MD;  Location: [facility] OR;  Service: Vascular;   Laterality: Left;  Left Femoral - Anterior Tibial Bypass;  saphenous vein graft from left leg  . FEMORAL-TIBIAL BYPASS GRAFT  02/07/2012   Procedure: BYPASS GRAFT FEMORAL-TIBIAL ARTERY;  Surgeon: Rosetta Posner, MD;  Location: [place];  Service: Vascular;  Laterality: Left;  Thrombectomy Left Femoral - Tibial Bypass Graft  . FEMORAL-TIBIAL BYPASS GRAFT  04/03/2012   Procedure: BYPASS GRAFT FEMORAL-TIBIAL ARTERY;  Surgeon: Mal Misty, MD;  Location: [place];  Service: Vascular;  Laterality: Left;  Redo  . FEMORAL-TIBIAL BYPASS GRAFT Left 09/30/2012   Procedure: REDO LEFT FEMORAL-ANTERIOR TIBIAL ARTERY BYPASS USING COMPOSITE CEPHALIC AND BASILIC VEIN GRAFT FROM LEFT ARM;  Surgeon: Mal Misty, MD;  Location: [place];  Service: Vascular;  Laterality: Left;  . FOOT SURGERY Right 03/20/2001   "have plates and screws in; didn't break it" (07/10/2012)  . GROIN DEBRIDEMENT Left 11/12/2012   Procedure: CLOSURE INGUINAL WOUND;  Surgeon: Mal Misty, MD;  Location: [place];  Service: Vascular;  Laterality: Left;  . I&D EXTREMITY Left 10/21/2012   Procedure: EXPLORATION AND DEBRIDEMENT OF LEFT GROIN WOUND;  Surgeon: Mal Misty, MD;  Location: [place];  Service: Vascular;  Laterality: Left;  . INSERT / REPLACE / REMOVE PACEMAKER  2007  . INTRAOPERATIVE ARTERIOGRAM  09/25/2011   Procedure: INTRA OPERATIVE ARTERIOGRAM;  Surgeon: Mal Misty, MD;  Location: [place];  Service: Vascular;  Laterality: Left;  . LEG AMPUTATION ABOVE KNEE Left 02/04/2013   DR LAWSON  . LOWER EXTREMITY ANGIOGRAM Left 12/25/2011   Procedure: LOWER EXTREMITY ANGIOGRAM;  Surgeon: Serafina Mitchell, MD;  Location: [facility] CATH LAB;  Service: Cardiovascular;  Laterality: Left;  lt leg angio co2  . LOWER EXTREMITY ANGIOGRAM N/A 07/07/2012   Procedure: LOWER EXTREMITY ANGIOGRAM;  Surgeon: Conrad [place], MD;  Location: [facility] CATH LAB;  Service: Cardiovascular;  Laterality: N/A;  . LOWER EXTREMITY ANGIOGRAM N/A 09/15/2012   Procedure: LOWER EXTREMITY ANGIOGRAM;   Surgeon: Serafina Mitchell, MD;  Location: [facility] CATH LAB;  Service: Cardiovascular;  Laterality: N/A;  . PATCH ANGIOPLASTY Left 10/21/2012   Procedure: PATCH ANGIOPLASTY;  Surgeon: Mal Misty, MD;  Location: [place];  Service: Vascular;  Laterality: Left;  . REMOVAL OF GRAFT Left 04/16/2013   Procedure: I & D LEFT AKA WOUND, POSSIBLE REMOVAL OF INFECTED GORTEX GRAFT;  Surgeon: Mal Misty, MD;  Location: [place];  Service: Vascular;  Laterality: Left;  . RENAL ARTERY STENT  2009  . SHOULDER OPEN ROTATOR CUFF REPAIR Right 2001   repair of lacerated right/notes 10/11/1999  (07/10/2012)  . TONSILLECTOMY    . TURP VAPORIZATION    .  UPPER EXTREMITY ANGIOGRAM  09/15/2012   Procedure: UPPER EXTREMITY ANGIOGRAM;  Surgeon: Serafina Mitchell, MD;  Location: [facility] CATH LAB;  Service: Cardiovascular;;  . UVULOPALATOPHARYNGOPLASTY (UPPP)/TONSILLECTOMY/SEPTOPLASTY  06/30/2003   Archie Endo 06/30/2003 (07/10/2012)   Past Medical History:  Diagnosis Date  . Arthritis   . Atrial fibrillation ([place])    takes Warfarin daily  . Automatic implantable cardioverter-defibrillator in situ   . Benign prostatic hypertrophy    takes Proscar daily  . CAD (coronary artery disease)    a. s/p mid LAD stenting with DES 2008 Dr. Daneen Schick  . CHF (congestive heart failure) ([place])    takes Lasix daily  . Chronic systolic dysfunction of left ventricle    a. mixed ischemic and nonischemic CM,  EF 35%. b. s/p AICD implantation.  . Constipation    takes Miralax daily as needed and Colace daily   . Depression   . ED (erectile dysfunction)   . Gout   . History of MRSA infection   . Hypertension    takes Carvedilol and Losartan daily  . ICD (implantable cardiac defibrillator) in place    medtronic, Dr. Rayann Heman  . ICD (implantable cardiac defibrillator) in place   . Insomnia    TAKES TRAZODONE NIGHTLY  . Numbness and tingling    Hx; 82f left foot  . Pacemaker    medtronic  . PAD (peripheral artery disease) (HCC)    Severe by PV  angiogram 09/2011  . PAD (peripheral artery disease) (HCC)    s/p multiple LLE bypass grafts; left mid-distal SCA occlusion by 08/2012 duplex  . Renal artery stenosis ([place])    s/p stenting 2009  . Sleep apnea    hx of "had surgery for"  . Type II diabetes mellitus (HCC)    takes Novolog 70/30   BP 131/89   Pulse 67   Resp 17   SpO2 97%   Opioid Risk Score:   Fall Risk Score:  `1  Depression screen PHQ 2/9  Depression screen [facility] 2/9 07/10/2015 05/09/2015 11/23/2014 06/03/2014  Decreased Interest 0 2 2 2   Down, Depressed, Hopeless 0 1 1 1   PHQ - 2 Score 0 3 3 3   Altered sleeping - - - 3  Tired, decreased energy - - - 2  Change in appetite - - - 0  Feeling bad or failure about yourself  - - - 0  Trouble concentrating - - - 1  Moving slowly or fidgety/restless - - - 0  Suicidal thoughts - - - 0  PHQ-9 Score - - - 9  Some recent data might be hidden    Review of Systems  Respiratory: Positive for wheezing.   Cardiovascular:       Limb swelling   Gastrointestinal: Positive for diarrhea.  All other systems reviewed and are negative.      Objective:   Physical Exam  Constitutional: He is oriented to person, place, and time. He appears well-developed and well-nourished.  HENT:  Head: Normocephalic and atraumatic.  Eyes: Conjunctivae are normal. Pupils are equal, round, and reactive to light.  Neck: Neck supple.  Cardiovascular: Normal rate and regular rhythm. No rubs, gallops. Pacer site dressed  No murmur heard.  Respiratory: Effort normal and breath sounds normal. No respiratory distress. He has no wheezes.  GI: Soft. Bowel sounds are normal. He exhibits no distension. There is no tenderness.  Musculoskeletal:  L-AKA prosthesis socket in place and appears to be fitting appropriately  Neurological: He is alert and oriented to  person, place, and time.  Follows commands. Strength grossly intact in UE's at 5/5. RLE is 5/5 prox to 5/5 distally. Left HF 5/5. mild sensory loss in  distal right leg/foot.  Skin: Right foot not examined. Psychiatric: He has a normal mood and affect. He is a little flat.  Judgment and thought content normal. Cognition and memory are normal.    Assessment/Plan:  1.Hx of left AKA and now with developing issues concerning there RLE/PAD -continue with PT---needs to improve stamina, balance, and confidence  2. Phantom limb pain- -continue gabapentin 300mg  tid. Hasn't been able to tolerate a higher dose or other anticonvulsants. -continue percocet to 10/325, may use q6 prn. #90 -consider follow up with  Dr. Isaiah Blakes  for sympathetic ganglion block for LLE pending his RLE mgt   3. Mood/phantom pain- begin trial of low dose cymbalta    Follow up with me in about 2 months. 15 minutes of face to face patient care time were spent during this visit. All questions were encouraged and answered.

## 2015-11-08 ENCOUNTER — Encounter (HOSPITAL_COMMUNITY)
Admission: RE | Disposition: A | Discharge status: Home / Self Care | Payer: Self-pay | Source: Ambulatory Visit | Attending: Vascular Surgery

## 2015-11-08 ENCOUNTER — Ambulatory Visit (HOSPITAL_COMMUNITY)
Admission: RE | Admit: 2015-11-08 | Discharge: 2015-11-08 | Disposition: A | Discharge status: Home / Self Care | Payer: PPO | Source: Ambulatory Visit | Attending: Vascular Surgery | Admitting: Vascular Surgery

## 2015-11-08 DIAGNOSIS — Z89612 Acquired absence of left leg above knee: Secondary | ICD-10-CM | POA: Insufficient documentation

## 2015-11-08 DIAGNOSIS — F17290 Nicotine dependence, other tobacco product, uncomplicated: Secondary | ICD-10-CM | POA: Insufficient documentation

## 2015-11-08 DIAGNOSIS — L97519 Non-pressure chronic ulcer of other part of right foot with unspecified severity: Secondary | ICD-10-CM | POA: Diagnosis not present

## 2015-11-08 DIAGNOSIS — I2510 Atherosclerotic heart disease of native coronary artery without angina pectoris: Secondary | ICD-10-CM | POA: Diagnosis not present

## 2015-11-08 DIAGNOSIS — Z8614 Personal history of Methicillin resistant Staphylococcus aureus infection: Secondary | ICD-10-CM | POA: Insufficient documentation

## 2015-11-08 DIAGNOSIS — M109 Gout, unspecified: Secondary | ICD-10-CM | POA: Insufficient documentation

## 2015-11-08 DIAGNOSIS — I701 Atherosclerosis of renal artery: Secondary | ICD-10-CM | POA: Diagnosis not present

## 2015-11-08 DIAGNOSIS — E11621 Type 2 diabetes mellitus with foot ulcer: Secondary | ICD-10-CM | POA: Diagnosis not present

## 2015-11-08 DIAGNOSIS — I4891 Unspecified atrial fibrillation: Secondary | ICD-10-CM | POA: Diagnosis not present

## 2015-11-08 DIAGNOSIS — G4700 Insomnia, unspecified: Secondary | ICD-10-CM | POA: Diagnosis not present

## 2015-11-08 DIAGNOSIS — M1990 Unspecified osteoarthritis, unspecified site: Secondary | ICD-10-CM | POA: Diagnosis not present

## 2015-11-08 DIAGNOSIS — I509 Heart failure, unspecified: Secondary | ICD-10-CM | POA: Diagnosis not present

## 2015-11-08 DIAGNOSIS — E1151 Type 2 diabetes mellitus with diabetic peripheral angiopathy without gangrene: Secondary | ICD-10-CM | POA: Insufficient documentation

## 2015-11-08 DIAGNOSIS — Z794 Long term (current) use of insulin: Secondary | ICD-10-CM | POA: Diagnosis not present

## 2015-11-08 DIAGNOSIS — L03116 Cellulitis of left lower limb: Secondary | ICD-10-CM | POA: Insufficient documentation

## 2015-11-08 DIAGNOSIS — Z95810 Presence of automatic (implantable) cardiac defibrillator: Secondary | ICD-10-CM | POA: Insufficient documentation

## 2015-11-08 DIAGNOSIS — I70235 Atherosclerosis of native arteries of right leg with ulceration of other part of foot: Secondary | ICD-10-CM | POA: Diagnosis not present

## 2015-11-08 DIAGNOSIS — Z7901 Long term (current) use of anticoagulants: Secondary | ICD-10-CM | POA: Diagnosis not present

## 2015-11-08 DIAGNOSIS — I998 Other disorder of circulatory system: Secondary | ICD-10-CM | POA: Diagnosis not present

## 2015-11-08 DIAGNOSIS — G4730 Sleep apnea, unspecified: Secondary | ICD-10-CM | POA: Diagnosis not present

## 2015-11-08 DIAGNOSIS — F329 Major depressive disorder, single episode, unspecified: Secondary | ICD-10-CM | POA: Diagnosis not present

## 2015-11-08 DIAGNOSIS — I110 Hypertensive heart disease with heart failure: Secondary | ICD-10-CM | POA: Insufficient documentation

## 2015-11-08 DIAGNOSIS — N400 Enlarged prostate without lower urinary tract symptoms: Secondary | ICD-10-CM | POA: Insufficient documentation

## 2015-11-08 HISTORY — PX: PERIPHERAL VASCULAR CATHETERIZATION: SHX172C

## 2015-11-08 LAB — POCT I-STAT, CHEM 8
BUN: 49 mg/dL — ABNORMAL HIGH (ref 6–20)
CREATININE: 2 mg/dL — AB (ref 0.61–1.24)
Calcium, Ion: 1.15 mmol/L (ref 1.15–1.40)
Chloride: 110 mmol/L (ref 101–111)
Glucose, Bld: 120 mg/dL — ABNORMAL HIGH (ref 65–99)
HEMATOCRIT: 43 % (ref 39.0–52.0)
HEMOGLOBIN: 14.6 g/dL (ref 13.0–17.0)
POTASSIUM: 5.3 mmol/L — AB (ref 3.5–5.1)
Sodium: 139 mmol/L (ref 135–145)
TCO2: 23 mmol/L (ref 0–100)

## 2015-11-08 LAB — PROTIME-INR
INR: 1.2
Prothrombin Time: 15.2 seconds (ref 11.4–15.2)

## 2015-11-08 LAB — POCT ACTIVATED CLOTTING TIME
ACTIVATED CLOTTING TIME: 202 s
ACTIVATED CLOTTING TIME: 224 s
Activated Clotting Time: 164 seconds

## 2015-11-08 LAB — GLUCOSE, CAPILLARY: GLUCOSE-CAPILLARY: 128 mg/dL — AB (ref 65–99)

## 2015-11-08 SURGERY — ABDOMINAL AORTOGRAM W/LOWER EXTREMITY
Anesthesia: LOCAL

## 2015-11-08 MED ORDER — LIDOCAINE HCL (PF) 1 % IJ SOLN
INTRAMUSCULAR | Status: DC | PRN
  Administered 2015-11-08: 22 mL

## 2015-11-08 MED ORDER — OXYCODONE-ACETAMINOPHEN 5-325 MG PO TABS: 1.0000 | ORAL_TABLET | ORAL | Status: DC | PRN

## 2015-11-08 MED ORDER — HEPARIN SODIUM (PORCINE) 1000 UNIT/ML IJ SOLN
INTRAMUSCULAR | Status: AC
  Filled 2015-11-08: qty 1

## 2015-11-08 MED ORDER — MORPHINE SULFATE (PF) 2 MG/ML IV SOLN
2.0000 mg | INTRAVENOUS | Status: DC | PRN
  Administered 2015-11-08: 2 mg via INTRAVENOUS

## 2015-11-08 MED ORDER — HEPARIN (PORCINE) IN NACL 2-0.9 UNIT/ML-% IJ SOLN
INTRAMUSCULAR | Status: AC
  Filled 2015-11-08: qty 1000

## 2015-11-08 MED ORDER — HEPARIN SODIUM (PORCINE) 1000 UNIT/ML IJ SOLN
INTRAMUSCULAR | Status: DC | PRN
  Administered 2015-11-08: 8000 [IU] via INTRAVENOUS

## 2015-11-08 MED ORDER — MORPHINE SULFATE (PF) 2 MG/ML IV SOLN
INTRAVENOUS | Status: AC
  Filled 2015-11-08: qty 1

## 2015-11-08 MED ORDER — HYDRALAZINE HCL 20 MG/ML IJ SOLN
INTRAMUSCULAR | Status: DC | PRN
  Administered 2015-11-08: 20 mg via INTRAVENOUS

## 2015-11-08 MED ORDER — LIDOCAINE HCL (PF) 1 % IJ SOLN
INTRAMUSCULAR | Status: AC
  Filled 2015-11-08: qty 30

## 2015-11-08 MED ORDER — HYDRALAZINE HCL 20 MG/ML IJ SOLN
INTRAMUSCULAR | Status: AC
  Filled 2015-11-08: qty 1

## 2015-11-08 MED ORDER — CLOPIDOGREL BISULFATE 75 MG PO TABS: 75.0000 mg | ORAL_TABLET | Freq: Every day | ORAL | 3 refills | Status: DC

## 2015-11-08 MED ORDER — HEPARIN (PORCINE) IN NACL 2-0.9 UNIT/ML-% IJ SOLN
INTRAMUSCULAR | Status: DC | PRN
  Administered 2015-11-08: 1000 mL

## 2015-11-08 MED ORDER — SODIUM CHLORIDE 0.9% FLUSH
3.0000 mL | INTRAVENOUS | Status: DC | PRN
Start: 2015-11-08 — End: 2015-11-08

## 2015-11-08 SURGICAL SUPPLY — 28 items
BALLN IN.PACT DCB 4X150 (BALLOONS) ×2
BALLN IN.PACT DCB 6X150 (BALLOONS) ×4
CATH CXI SUPP ST 2.6FR 150CM (MICROCATHETER) ×1 IMPLANT
CATH OMNI FLUSH 5F 65CM (CATHETERS) ×1 IMPLANT
CATH TEMPO AQUA 5F 100CM (CATHETERS) ×1 IMPLANT
COVER PRB 48X5XTLSCP FOLD TPE (BAG) IMPLANT
COVER PROBE 5X48 (BAG) ×2
DCB IN.PACT 4X150 (BALLOONS) IMPLANT
DCB IN.PACT 6X150 (BALLOONS) IMPLANT
DEVICE SPIDERFX EMB PROT 4MM (WIRE) ×1 IMPLANT
FILTER CO2 0.2 MICRON (VASCULAR PRODUCTS) ×1 IMPLANT
KIT ENCORE 26 ADVANTAGE (KITS) ×1 IMPLANT
KIT MICROINTRODUCER STIFF 5F (SHEATH) ×1 IMPLANT
KIT PV (KITS) ×2 IMPLANT
RESERVOIR CO2 (VASCULAR PRODUCTS) ×1 IMPLANT
SET FLUSH CO2 (MISCELLANEOUS) ×1 IMPLANT
SHEATH HIGHFLEX ANSEL 6FRX55 (SHEATH) ×1 IMPLANT
SHEATH PINNACLE 5F 10CM (SHEATH) ×1 IMPLANT
SHEATH PINNACLE 6F 10CM (SHEATH) ×1 IMPLANT
SHEATH PINNACLE 7F 10CM (SHEATH) ×1 IMPLANT
STERLING SL OTW 3.0X100X150 (BALLOONS) ×1 IMPLANT
STERLING SL OTW 4.0X150X150 (BALLOONS) ×1 IMPLANT
SYR MEDRAD MARK V 150ML (SYRINGE) ×2 IMPLANT
TRANSDUCER W/STOPCOCK (MISCELLANEOUS) ×2 IMPLANT
TRAY PV CATH (CUSTOM PROCEDURE TRAY) ×2 IMPLANT
WIRE BENTSON .035X145CM (WIRE) ×1 IMPLANT
WIRE G V18X300CM (WIRE) ×1 IMPLANT
WIRE HI TORQ VERSACORE J 260CM (WIRE) ×1 IMPLANT

## 2015-11-08 NOTE — H&P (Signed)
H+P   History of Present Illness: This is a 81 y.o. male with history of multiple LLE procedures. Now has pain and toe ulceration of his R foot. He is ambulatory with prosthetic on left. On coumadin for afib but currently held. Has had left arm and Rle vein used in the past for bypass surgery. He is diabetic. Former smoker.  Past Medical History:  Diagnosis Date  . Arthritis   . Atrial fibrillation ([place])    takes Warfarin daily  . Automatic implantable cardioverter-defibrillator in situ   . Benign prostatic hypertrophy    takes Proscar daily  . CAD (coronary artery disease)    a. s/p mid LAD stenting with DES 2008 Dr. Daneen Schick  . CHF (congestive heart failure) ([place])    takes Lasix daily  . Chronic systolic dysfunction of left ventricle    a. mixed ischemic and nonischemic CM,  EF 35%. b. s/p AICD implantation.  . Constipation    takes Miralax daily as needed and Colace daily   . Depression   . ED (erectile dysfunction)   . Gout   . History of MRSA infection   . Hypertension    takes Carvedilol and Losartan daily  . ICD (implantable cardiac defibrillator) in place    medtronic, Dr. Rayann Heman  . ICD (implantable cardiac defibrillator) in place   . Insomnia    TAKES TRAZODONE NIGHTLY  . Numbness and tingling    Hx; 11f left foot  . Pacemaker    medtronic  . PAD (peripheral artery disease) (HCC)    Severe by PV angiogram 09/2011  . PAD (peripheral artery disease) (HCC)    s/p multiple LLE bypass grafts; left mid-distal SCA occlusion by 08/2012 duplex  . Renal artery stenosis ([place])    s/p stenting 2009  . Sleep apnea    hx of "had surgery for"  . Type II diabetes mellitus (HCC)    takes Novolog 70/30    Past Surgical History:  Procedure Laterality Date  . ABDOMINAL ANGIOGRAM  12/25/2011   Procedure: ABDOMINAL ANGIOGRAM;  Surgeon: Serafina Mitchell, MD;  Location: [facility] CATH LAB;  Service: Cardiovascular;;  . ABDOMINAL AORTAGRAM N/A 09/11/2011   Procedure: ABDOMINAL Maxcine Ham;   Surgeon: Wellington Hampshire, MD;  Location: [place] CATH LAB;  Service: Cardiovascular;  Laterality: N/A;  . ADENOIDECTOMY     Hx; of   . AMPUTATION Left 02/04/2013   Procedure: AMPUTATION ABOVE KNEE-LEFT;  Surgeon: Mal Misty, MD;  Location: [place];  Service: Vascular;  Laterality: Left;  . BICEPS TENDON REPAIR Right 2001   Archie Endo 03/20/2001 (07/10/2012)  . BIV ICD GENERTAOR CHANGE OUT N/A 11/09/2013   Procedure: BIV ICD GENERTAOR CHANGE OUT;  Surgeon: Coralyn Mark, MD;  Location: [facility] CATH LAB;  Service: Cardiovascular;  Laterality: N/A;  . CARDIAC DEFIBRILLATOR PLACEMENT  12/26/09   pacemaker combo  . CARPAL TUNNEL RELEASE Right 2002   Archie Endo 03/20/2001 (07/10/2012)  . cervical epidural injection  2013  . COLONOSCOPY     Hx; of  . CORONARY ANGIOPLASTY    . CORONARY ANGIOPLASTY WITH STENT PLACEMENT  ~ 2000  . EMBOLECTOMY  02/07/2012   Procedure: EMBOLECTOMY;  Surgeon: Rosetta Posner, MD;  Location: [place];  Service: Vascular;  Laterality: Left;  . EMBOLECTOMY Left 12/16/2012   Procedure: THROMBECTOMY  LEFT LEG BYPASS;  Surgeon: Elam Dutch, MD;  Location: [place];  Service: Vascular;  Laterality: Left;  . FEMORAL-POPLITEAL BYPASS GRAFT Left 09/16/2012   Procedure: LEFT FEMORAL-POPLITEAL  BYPASS GRAFT WITH GORTEX Propaten Graft 6x80 Thin Wall and Left lower leg Angiogram;  Surgeon: Mal Misty, MD;  Location: [place];  Service: Vascular;  Laterality: Left;  . FEMORAL-TIBIAL BYPASS GRAFT  09/25/2011   Procedure: BYPASS GRAFT FEMORAL-TIBIAL ARTERY;  Surgeon: Mal Misty, MD;  Location: [facility] OR;  Service: Vascular;  Laterality: Left;  Left Femoral - Anterior Tibial Bypass;  saphenous vein graft from left leg  . FEMORAL-TIBIAL BYPASS GRAFT  02/07/2012   Procedure: BYPASS GRAFT FEMORAL-TIBIAL ARTERY;  Surgeon: Rosetta Posner, MD;  Location: [place];  Service: Vascular;  Laterality: Left;  Thrombectomy Left Femoral - Tibial Bypass Graft  . FEMORAL-TIBIAL BYPASS GRAFT  04/03/2012   Procedure: BYPASS GRAFT  FEMORAL-TIBIAL ARTERY;  Surgeon: Mal Misty, MD;  Location: [place];  Service: Vascular;  Laterality: Left;  Redo  . FEMORAL-TIBIAL BYPASS GRAFT Left 09/30/2012   Procedure: REDO LEFT FEMORAL-ANTERIOR TIBIAL ARTERY BYPASS USING COMPOSITE CEPHALIC AND BASILIC VEIN GRAFT FROM LEFT ARM;  Surgeon: Mal Misty, MD;  Location: [place];  Service: Vascular;  Laterality: Left;  . FOOT SURGERY Right 03/20/2001   "have plates and screws in; didn't break it" (07/10/2012)  . GROIN DEBRIDEMENT Left 11/12/2012   Procedure: CLOSURE INGUINAL WOUND;  Surgeon: Mal Misty, MD;  Location: [place];  Service: Vascular;  Laterality: Left;  . I&D EXTREMITY Left 10/21/2012   Procedure: EXPLORATION AND DEBRIDEMENT OF LEFT GROIN WOUND;  Surgeon: Mal Misty, MD;  Location: [place];  Service: Vascular;  Laterality: Left;  . INSERT / REPLACE / REMOVE PACEMAKER  2007  . INTRAOPERATIVE ARTERIOGRAM  09/25/2011   Procedure: INTRA OPERATIVE ARTERIOGRAM;  Surgeon: Mal Misty, MD;  Location: [place];  Service: Vascular;  Laterality: Left;  . LEG AMPUTATION ABOVE KNEE Left 02/04/2013   DR LAWSON  . LOWER EXTREMITY ANGIOGRAM Left 12/25/2011   Procedure: LOWER EXTREMITY ANGIOGRAM;  Surgeon: Serafina Mitchell, MD;  Location: [facility] CATH LAB;  Service: Cardiovascular;  Laterality: Left;  lt leg angio co2  . LOWER EXTREMITY ANGIOGRAM N/A 07/07/2012   Procedure: LOWER EXTREMITY ANGIOGRAM;  Surgeon: Conrad Greenbriar, MD;  Location: [facility] CATH LAB;  Service: Cardiovascular;  Laterality: N/A;  . LOWER EXTREMITY ANGIOGRAM N/A 09/15/2012   Procedure: LOWER EXTREMITY ANGIOGRAM;  Surgeon: Serafina Mitchell, MD;  Location: [facility] CATH LAB;  Service: Cardiovascular;  Laterality: N/A;  . PATCH ANGIOPLASTY Left 10/21/2012   Procedure: PATCH ANGIOPLASTY;  Surgeon: Mal Misty, MD;  Location: [place];  Service: Vascular;  Laterality: Left;  . REMOVAL OF GRAFT Left 04/16/2013   Procedure: I & D LEFT AKA WOUND, POSSIBLE REMOVAL OF INFECTED GORTEX GRAFT;  Surgeon: Mal Misty, MD;  Location: [place];  Service: Vascular;  Laterality: Left;  . RENAL ARTERY STENT  2009  . SHOULDER OPEN ROTATOR CUFF REPAIR Right 2001   repair of lacerated right/notes 10/11/1999  (07/10/2012)  . TONSILLECTOMY    . TURP VAPORIZATION    . UPPER EXTREMITY ANGIOGRAM  09/15/2012   Procedure: UPPER EXTREMITY ANGIOGRAM;  Surgeon: Serafina Mitchell, MD;  Location: [facility] CATH LAB;  Service: Cardiovascular;;  . UVULOPALATOPHARYNGOPLASTY (UPPP)/TONSILLECTOMY/SEPTOPLASTY  06/30/2003   Archie Endo 06/30/2003 (07/10/2012)    Allergies  Allergen Reactions  . Other Other (See Comments)    Plastic tape tears skin  . Bactrim Ds [Sulfamethoxazole-Trimethoprim] Other (See Comments)    Hand tremors    Prior to Admission medications   Medication Sig Start Date End Date Taking? Authorizing Provider  allopurinol (ZYLOPRIM) 100 MG tablet Take 1 tablet (100 mg total) by mouth daily as needed (Gout). 05/03/15  Yes Laurey Morale, MD  amLODipine (NORVASC) 5 MG tablet Take 1 tablet (5 mg total) by mouth daily. 05/03/15  Yes Laurey Morale, MD  carvedilol (COREG) 25 MG tablet Take 1 tablet (25 mg total) by mouth 2 (two) times daily with a meal. 05/03/15  Yes Laurey Morale, MD  cephALEXin (KEFLEX) 500 MG capsule Take 1 capsule (500 mg total) by mouth 4 (four) times daily. 10/30/15 11/09/15 Yes Laurey Morale, MD  DULoxetine (CYMBALTA) 20 MG capsule Take 1 capsule (20 mg total) by mouth daily. 11/07/15  Yes Meredith Staggers, MD  finasteride (PROSCAR) 5 MG tablet Take 1 tablet (5 mg total) by mouth daily. 05/03/15  Yes Laurey Morale, MD  furosemide (LASIX) 40 MG tablet Take 1 tablet (40 mg total) by mouth daily. 08/29/15 08/28/16 Yes Scott T Weaver, PA-C  gabapentin (NEURONTIN) 300 MG capsule Take 2 capsules (600 mg total) by mouth 3 (three) times daily. 08/17/15  Yes Laurey Morale, MD  insulin aspart protamine- aspart (NOVOLOG MIX 70/30) (70-30) 100 UNIT/ML injection Inject 7 Units into the skin 2 (two) times daily with a meal. 03/03/13  Yes  Kennyth Arnold, FNP  losartan (COZAAR) 100 MG tablet Take 1 tablet (100 mg total) by mouth daily. 05/03/15  Yes Laurey Morale, MD  ONE TOUCH ULTRA TEST test strip USE TO TEST BLOOD SUGAR TWO TIMES DAILY AS INSTRUCTED BY PHYSICIAN. 08/22/15  Yes Laurey Morale, MD  oxyCODONE-acetaminophen (PERCOCET) 10-325 MG tablet Take 1 tablet by mouth every 6 (six) hours as needed for pain. 11/07/15  Yes Meredith Staggers, MD  warfarin (COUMADIN) 5 MG tablet TAKE AS DIRECTED BY ANTICOAGULATION CLINIC Patient taking differently: Take 2.5 mg by mouth daily.  05/03/15  Yes Laurey Morale, MD  Insulin Pen Needle 31G X 8 MM MISC Use 2-3 times per day and diagnosis code is 250.00 11/25/13   Laurey Morale, MD  nitroGLYCERIN (NITROSTAT) 0.4 MG SL tablet Place 1 tablet (0.4 mg total) under the tongue every 5 (five) minutes as needed for chest pain. For chest pain, max 3 doses 05/28/13   Laurey Morale, MD  predniSONE (DELTASONE) 10 MG tablet 40 mg x 4 days, 20 mg x 3 days, 10 mg x 2 days 10/10/15   Dorothyann Peng, NP  sildenafil (VIAGRA) 100 MG tablet Take as directed 03/24/15   Thompson Grayer, MD    Social History   Social History  . Marital status: Widowed    Spouse name: N/A  . Number of children: N/A  . Years of education: N/A   Occupational History  . Retired Retired   Social History Main Topics  . Smoking status: Current Some Day Smoker    Types: Cigars  . Smokeless tobacco: Never Used     Comment: 1 cigar each month  . Alcohol use 0.0 oz/week     Comment: once a week wine  . Drug use: No  . Sexual activity: Not Currently   Other Topics Concern  . Not on file   Social History Narrative   Lives in [place] with significant other,  [name].    Family History  Problem Relation Age of Onset  . Cancer Father   . Cancer      breast/fhx  . Heart disease      fhx  . Diabetes Neg Hx  ROS: [x]  Positive   [ ]  Negative   [ ]  All sytems reviewed and are negative No complaints today   Physical  Examination  Vitals:   11/08/15 0835  BP: (!) 142/113  Pulse: 76  Resp: 18  Temp: 97.5 F (36.4 C)   Body mass index is 27.06 kg/m.  General:  WDWN in NAD Gait: Not observed HENT: WNL, normocephalic Pulmonary: normal non-labored breathing, without Rales, rhonchi,  wheezing Cardiac: irregular Abdomen: soft, NT/ND, no masses  Vascular Exam/Pulses: monphasic dp/pt on left 2+ left fem 1+ R fem No palpable R popliteal  Extremities: R toes with erythema, no open wounds  CBC    Component Value Date/Time   WBC 6.4 08/28/2015 0851   RBC 4.57 08/28/2015 0851   HGB 14.6 11/08/2015 0920   HCT 43.0 11/08/2015 0920   PLT 144.0 (L) 08/28/2015 0851   MCV 85.3 08/28/2015 0851   MCH 29.6 06/15/2014 1535   MCHC 33.0 08/28/2015 0851   RDW 15.5 08/28/2015 0851   LYMPHSABS 1.0 08/28/2015 0851   MONOABS 0.5 08/28/2015 0851   EOSABS 0.2 08/28/2015 0851   BASOSABS 0.0 08/28/2015 0851    BMET    Component Value Date/Time   NA 139 11/08/2015 0920   K 5.3 (H) 11/08/2015 0920   CL 110 11/08/2015 0920   CO2 26 08/28/2015 0851   GLUCOSE 120 (H) 11/08/2015 0920   BUN 49 (H) 11/08/2015 0920   CREATININE 2.00 (H) 11/08/2015 0920   CREATININE 2.68 (H) 09/07/2012 0812   CALCIUM 8.7 08/28/2015 0851   GFRNONAA 32 (L) 06/16/2014 0628   GFRAA 37 (L) 06/16/2014 0628    COAGS: Lab Results  Component Value Date   INR 2.9 09/25/2015   INR 3.5 08/14/2015   INR 2.9 07/03/2015     Non-Invasive Vascular Imaging:       ASSESSMENT/PLAN: This is a 81 y.o. male with toe ulceration and ABI 0.3. Coumadin held x 5 days. Angiogram with Co2 today aorta and RLE runoff with possible intervention.  Luther Newhouse C. Donzetta Matters, MD [facility] Office: [phone] Pager: [phone]

## 2015-11-08 NOTE — Op Note (Signed)
    Patient name: [name] MRN: [id number] DOB: 1934-09-11 Sex: male  11/08/2015 Pre-operative Diagnosis: critical Right lower extremity ischemia Post-operative diagnosis:  Same Surgeon:  Servando Snare, MD Procedure Performed:  1.  US guided access left common femoral artery  2.  Aortogram with Right lower extremity runoff  3.  Drug coated balloon angioplasty Right SFA 42mm  4.  Drug coated balloon angioplasty Right popliteal 81mm  5.  Balloon angioplasty  tp trunk and peroneal artery with 47mm balloon   Indications:  81 year old white male with history of multiple left lower extremity interventions now has ulceration of his right toes with cellulitis that is being treated with antibiotics. He is on Coumadin for atrial fibrillation but otherwise does not take antiplatelet agents. He was recently ABI of 0.3 and also has rest pain and is indicated for the above operation.  Findings: The aortoiliac segment appears open with CO2 contrast his SFA multiple stenoses without frank occlusion. He occludes at the knee his popliteal artery reconstitutes his perineal mid calf. Following our balloon angioplasties he has direct in-line flow. His peroneal which feeds his DP and PT at the level of the foot.  Procedure:  The patient was identified in the holding area and taken to room 8.  The patient was then placed supine on the table and prepped and draped in the usual sterile fashion.  A time out was called.  Ultrasound was used to evaluate the left common femoral artery.  It was patent .  A digital ultrasound image was acquired.  A micropuncture needle was used to access the left common femoral artery under ultrasound guidance.  An 018 wire was advanced without resistance and a micropuncture sheath was placed.  The 018 wire was removed and a benson wire was placed.  The micropuncture sheath was exchanged for a 5 french sheath.  An omniflush catheter was advanced over the wire to the level of L-1.  An abdominal  angiogram was obtained with C02.  Next, using the omniflush catheter and a benson wire, the aortic bifurcation was crossed and the catheter was placed into theright external iliac artery and right runoff was obtained with combination C02 and contrast. The decision was then made to intervene. The wire was placed down to the level of the popliteal artery was occluded and we exchanged for 6 French sheath placed in the right femoral artery. We then used a v18 to pass the occluded popliteal and peroneal artery. Given these first wire passed we then placed an 014 spider filter into the peroneal artery. We then used 2 mm balloon to balloon our peroneal and popliteal artery. A 4 mm balloon was used to predilate R SFA followed by drug coated balloon of the SFA with a 6 mm in fashion. We then performed angiogram of the right lower extremity which demonstrated persistent stenosis at the level above that. With below-knee popliteal dissection. 4 mm drug-coated balloon at this level. Following this we had brisk runoff in line with our peroneal as noted above. Satisfied we retrieved of endovascular interval wire exchanged for a short 6 [place] sheath. Patient tolerated this well as well will go to the holding area to have a sheath. There were no immediate complications.  Contrast: 50cc    Annaka Cleaver C. Donzetta Matters, MD [facility] Office: [phone] Pager: [phone]

## 2015-11-08 NOTE — Progress Notes (Signed)
Site area: Left groin a 6 french arterial sheath was removed  Site Prior to Removal:  Level 0  Pressure Applied For 20 MINUTES    Bedrest Beginning at 1535p for four hours  Manual:   Yes.    Patient Status During Pull:  stable  Post Pull Groin Site:  Level 0  Post Pull Instructions Given:  Yes.    Post Pull Pulses Present:  Yes.    Dressing Applied:  Yes.    Comments:  VS remain stable duirng sheath pull

## 2015-11-08 NOTE — Discharge Instructions (Signed)
Angiogram, Care After °Refer to this sheet in the next few weeks. These instructions provide you with information about caring for yourself after your procedure. Your health care provider may also give you more specific instructions. Your treatment has been planned according to current medical practices, but problems sometimes occur. Call your health care provider if you have any problems or questions after your procedure. °WHAT TO EXPECT AFTER THE PROCEDURE °After your procedure, it is typical to have the following: °· Bruising at the catheter insertion site that usually fades within 1-2 weeks. °· Blood collecting in the tissue (hematoma) that may be painful to the touch. It should usually decrease in size and tenderness within 1-2 weeks. °HOME CARE INSTRUCTIONS °· Take medicines only as directed by your health care provider. °· You may shower 24-48 hours after the procedure or as directed by your health care provider. Remove the bandage (dressing) and gently wash the site with plain soap and water. Pat the area dry with a clean towel. Do not rub the site, because this may cause bleeding. °· Do not take baths, swim, or use a hot tub until your health care provider approves. °· Check your insertion site every day for redness, swelling, or drainage. °· Do not apply powder or lotion to the site. °· Do not lift over 10 lb (4.5 kg) for 5 days after your procedure or as directed by your health care provider. °· Ask your health care provider when it is okay to: °¨ Return to work or school. °¨ Resume usual physical activities or sports. °¨ Resume sexual activity. °· Do not drive home if you are discharged the same day as the procedure. Have someone else drive you. °· You may drive 24 hours after the procedure unless otherwise instructed by your health care provider. °· Do not operate machinery or power tools for 24 hours after the procedure or as directed by your health care provider. °· If your procedure was done as an  outpatient procedure, which means that you went home the same day as your procedure, a responsible adult should be with you for the first 24 hours after you arrive home. °· Keep all follow-up visits as directed by your health care provider. This is important. °SEEK MEDICAL CARE IF: °· You have a fever. °· You have chills. °· You have increased bleeding from the catheter insertion site. Hold pressure on the site.  CALL 911 °SEEK IMMEDIATE MEDICAL CARE IF: °· You have unusual pain at the catheter insertion site. °· You have redness, warmth, or swelling at the catheter insertion site. °· You have drainage (other than a small amount of blood on the dressing) from the catheter insertion site. °· The catheter insertion site is bleeding, and the bleeding does not stop after 30 minutes of holding steady pressure on the site. °· The area near or just beyond the catheter insertion site becomes pale, cool, tingly, or numb. °  °This information is not intended to replace advice given to you by your health care provider. Make sure you discuss any questions you have with your health care provider. °  °Document Released: 09/06/2004 Document Revised: 03/11/2014 Document Reviewed: 07/22/2012 °Elsevier Interactive Patient Education ©2016 [employer] ° °

## 2015-11-09 ENCOUNTER — Encounter (HOSPITAL_COMMUNITY): Payer: Self-pay | Admitting: Vascular Surgery

## 2015-11-10 ENCOUNTER — Ambulatory Visit (INDEPENDENT_AMBULATORY_CARE_PROVIDER_SITE_OTHER): Payer: PPO

## 2015-11-10 ENCOUNTER — Telehealth: Payer: Self-pay

## 2015-11-10 ENCOUNTER — Telehealth: Payer: Self-pay | Admitting: Cardiology

## 2015-11-10 DIAGNOSIS — Z95810 Presence of automatic (implantable) cardiac defibrillator: Secondary | ICD-10-CM | POA: Diagnosis not present

## 2015-11-10 DIAGNOSIS — I5022 Chronic systolic (congestive) heart failure: Secondary | ICD-10-CM | POA: Diagnosis not present

## 2015-11-10 NOTE — Telephone Encounter (Signed)
Phone call from pt.  Reported he continues to have swelling in the right ankle.  Stated the ankle swelling improved overnight, and worsened throughout the day.  Also c/o sore right heel.  Encouraged to elevate the right leg above level of heart, at intervals, during day and evening.  Also advised when laying in bed, to place a pillow under the right leg to keep the right heel off the mattress.  Verb. Understanding. Pt. knows to call back with worsening symptoms.

## 2015-11-10 NOTE — Progress Notes (Signed)
EPIC Encounter for ICM Monitoring  Patient Name: [name] is a 81 y.o. male Date: 11/10/2015 Primary Care Physican: Laurey Morale, MD Primary Cardiologist: Tamala Julian Electrophysiologist: Allred Dry Weight: Unknown - amputee Bi-V Pacing:  99.8%       Heart Failure questions reviewed, pt asymptomatic.  Hospitalized for blood clot and discharged on 11/08/2015.    Thoracic impedance back to baseline on 11/08/2015 and just starting to trend below baseline today.  He thinks he may have retained the fluid from the hospitalization.    Labs: 11/08/2015 Creatinine 2.00, BUN 49, Potassium 5.3, Sodium 139 08/28/2015 Creatinine 1.75, BUN 26, Potassium 5.2, Sodium 141   Recommendations: No changes.  Advised to read the labels of his foods and limit salt intake to 2000 mg daily.      Follow-up plan: ICM clinic phone appointment on 12/11/2015.  Copy of ICM check sent to primary cardiologist and device physician.   ICM trend: 11/10/2015       Rosalene Billings, RN 11/10/2015 10:48 AM

## 2015-11-10 NOTE — Telephone Encounter (Signed)
Spoke with pt and reminded pt of remote transmission that is due today. Pt verbalized understanding.   

## 2015-11-13 ENCOUNTER — Telehealth: Payer: Self-pay

## 2015-11-13 ENCOUNTER — Ambulatory Visit (INDEPENDENT_AMBULATORY_CARE_PROVIDER_SITE_OTHER): Payer: PPO | Admitting: General Practice

## 2015-11-13 DIAGNOSIS — I4891 Unspecified atrial fibrillation: Secondary | ICD-10-CM

## 2015-11-13 LAB — POCT INR: INR: 1.5

## 2015-11-13 NOTE — Telephone Encounter (Signed)
Phone call from pt.  Reported his right heel where it is cracked and dry is painful.  Also reported the Right great toe continues to drain thin, bloody drainage. Stated there is swelling of the right ankle that seems better overnight, and then increases during day.  Stated all of the above sx's were present prior to the Angiogram.  Did report the right leg is warm now with feeling present.  Reported that his toenail is half off on the right great toe, and he was told not to go to the Podiatrist for about 3 weeks.   Advised will schedule an appt. with ABI's and to see Dr. Donzetta Matters, if [place]., this week.  Advised a Scheduler will call him back with an appt.  Agreed.

## 2015-11-13 NOTE — Telephone Encounter (Signed)
Please confirm that the request is for ABI's?  He had an ABI on 11/03/15.

## 2015-11-14 ENCOUNTER — Other Ambulatory Visit: Payer: Self-pay | Admitting: *Deleted

## 2015-11-14 DIAGNOSIS — I739 Peripheral vascular disease, unspecified: Secondary | ICD-10-CM

## 2015-11-14 DIAGNOSIS — Z48812 Encounter for surgical aftercare following surgery on the circulatory system: Secondary | ICD-10-CM

## 2015-11-14 LAB — TOXASSURE SELECT,+ANTIDEPR,UR

## 2015-11-15 ENCOUNTER — Telehealth: Payer: Self-pay

## 2015-11-15 NOTE — Telephone Encounter (Signed)
Please follow up with patient regarding source.

## 2015-11-15 NOTE — Telephone Encounter (Signed)
Pt's UDS showed a positive level of Tramadol. However, pt declared taking Oxycodone. Tramadol is not on pt's med list. Please advise.

## 2015-11-15 NOTE — Telephone Encounter (Signed)
Pt states it was an old rx that he received from this office. I advised pt bring that rx to next office visit to destroy and to only take the Percocet as prescribed. He states he ran out of the Percocet early due to the pain in his leg, so he took the Tramadol for some relief.

## 2015-11-16 ENCOUNTER — Encounter: Payer: Self-pay | Admitting: Vascular Surgery

## 2015-11-17 ENCOUNTER — Encounter: Payer: Self-pay | Admitting: Vascular Surgery

## 2015-11-17 ENCOUNTER — Ambulatory Visit (HOSPITAL_COMMUNITY)
Admission: RE | Admit: 2015-11-17 | Discharge: 2015-11-17 | Disposition: A | Discharge status: Home / Self Care | Payer: PPO | Source: Ambulatory Visit | Attending: Vascular Surgery | Admitting: Vascular Surgery

## 2015-11-17 ENCOUNTER — Ambulatory Visit (INDEPENDENT_AMBULATORY_CARE_PROVIDER_SITE_OTHER): Payer: Self-pay | Admitting: Vascular Surgery

## 2015-11-17 VITALS — BP 151/68 | HR 62 | Temp 97.8°F | Ht 68.0 in | Wt 178.0 lb

## 2015-11-17 DIAGNOSIS — Z48812 Encounter for surgical aftercare following surgery on the circulatory system: Secondary | ICD-10-CM | POA: Diagnosis not present

## 2015-11-17 DIAGNOSIS — I739 Peripheral vascular disease, unspecified: Secondary | ICD-10-CM | POA: Diagnosis not present

## 2015-11-17 DIAGNOSIS — L97511 Non-pressure chronic ulcer of other part of right foot limited to breakdown of skin: Secondary | ICD-10-CM

## 2015-11-17 DIAGNOSIS — Z89612 Acquired absence of left leg above knee: Secondary | ICD-10-CM | POA: Diagnosis not present

## 2015-11-17 NOTE — Progress Notes (Signed)
Subjective:     Patient ID: [name], male   DOB: 1934/10/30, 81 y.o.   MRN: [id number]  HPI follows up from recent endovascular revascularization of his peroneal artery feeding his PT artery. He now complains of swelling in his right lower extremity although the pain in his toes has subsided. He states the Plavix has made him feel poorly and needle longer wants to take it. He is on Coumadin.   Review of Systems  Musculoskeletal:       RLE swelling       Objective:   Physical Exam  Constitutional: He appears well-developed.  Cardiovascular: Normal rate.   Pulmonary/Chest: Effort normal.  Musculoskeletal: He exhibits edema.  Missing nail of R great toe S/p L bka       Assessment:  81 year old white male previous left lower extremity amputation now status post endovascular revascularization of right peroneal artery runoff to his foot. He does have some swelling of his right lower extremity for which he is concerned. He has ulceration of his toes that appears to be healing.    Plan:    #1 continue Coumadin change from Plavix to aspirin given intolerance.  #2 warm Epsom salt baths keep toes dry and protected in between.  #3 elevate leg when recumbent.  #4 schedule follow-up for 1 month. If wounds have healed swelling resolved at that time can push out to 3 month visit. If further issues arise in between can call to be seen sooner.  [name] C. Donzetta Matters, MD [facility] Office: [phone] Pager: [phone]

## 2015-11-20 ENCOUNTER — Ambulatory Visit (INDEPENDENT_AMBULATORY_CARE_PROVIDER_SITE_OTHER): Payer: PPO | Admitting: General Practice

## 2015-11-20 DIAGNOSIS — I4891 Unspecified atrial fibrillation: Secondary | ICD-10-CM | POA: Diagnosis not present

## 2015-11-20 LAB — POCT INR: INR: 3

## 2015-11-28 ENCOUNTER — Telehealth: Payer: Self-pay | Admitting: Vascular Surgery

## 2015-11-28 NOTE — Telephone Encounter (Signed)
Pt called and left a message wanting to know if Dr. Donzetta Matters filled out his [place] form.  I called pt back and left message to inform him that the form was signed and faxed at 4:00pm on 11-28-15.

## 2015-12-11 ENCOUNTER — Telehealth: Payer: Self-pay | Admitting: Cardiology

## 2015-12-11 NOTE — Telephone Encounter (Signed)
Spoke with pt and reminded pt of remote transmission that is due today. Pt verbalized understanding.   

## 2015-12-18 ENCOUNTER — Ambulatory Visit (INDEPENDENT_AMBULATORY_CARE_PROVIDER_SITE_OTHER): Payer: PPO | Admitting: General Practice

## 2015-12-18 ENCOUNTER — Encounter: Payer: Self-pay | Admitting: Vascular Surgery

## 2015-12-18 DIAGNOSIS — I639 Cerebral infarction, unspecified: Secondary | ICD-10-CM

## 2015-12-18 DIAGNOSIS — I4891 Unspecified atrial fibrillation: Secondary | ICD-10-CM

## 2015-12-18 LAB — POCT INR: INR: 4.3

## 2015-12-18 NOTE — Patient Instructions (Signed)
Pre visit review using our clinic review tool, if applicable. No additional management support is needed unless otherwise documented below in the visit note. INR is high today.  Patient has had more alcohol that usual.  Held coumadin for 2 days.   Will re-check in 2 weeks.  Reiterated the risks of a supra-therapeutic INR.  Patient does use pill box.  Patient verbalized understanding.

## 2015-12-19 NOTE — Progress Notes (Signed)
No ICM remote transmission received on 12/11/2015.   Next ICM transmission scheduled for 01/01/2016.

## 2015-12-22 ENCOUNTER — Ambulatory Visit (INDEPENDENT_AMBULATORY_CARE_PROVIDER_SITE_OTHER): Payer: Self-pay | Admitting: Vascular Surgery

## 2015-12-22 ENCOUNTER — Encounter: Payer: Self-pay | Admitting: Vascular Surgery

## 2015-12-22 VITALS — BP 159/67 | HR 73 | Temp 97.0°F | Resp 18 | Ht 68.0 in | Wt 178.0 lb

## 2015-12-22 DIAGNOSIS — I739 Peripheral vascular disease, unspecified: Secondary | ICD-10-CM

## 2015-12-22 NOTE — Progress Notes (Signed)
Subjective:     Patient ID: [name], male   DOB: 1934/08/09, 81 y.o.   MRN: [id number]  HPI [name] returns today for follow-up of a wound on his toes status post endovascular revascularization. He is now doing very well in the ulceration on his right second toe is almost completely healed. He does complain of swelling in his right leg but otherwise is without pain. He is continued on Coumadin and aspirin. He also wants wears compression stockings for the swelling.   Review of Systems Pain much improved    Objective:   Physical Exam  aaox3 Strong signal R peroneal Minimal RLE edema Punctate ulceration R 2nd toe at tip    Assessment:     82 year old white male with history of left above-the-knee amputation now status post endovascular revascularization of the right. He has mostly healed his wounds on his right toes. He continues on Coumadin and aspirin and does not have any pain at this time.    Plan:     F/u in 6 months with repeat RLE duplex and ABI Will call if has issues prior Instructed to protect with R foot and ok for compression stockings on right  Jonathan Wiemers C. Donzetta Matters, MD [facility] Office: [phone] Pager: [phone]

## 2015-12-22 NOTE — Progress Notes (Signed)
Vitals:   12/22/15 1019  BP: (!) 155/70  Pulse: 73  Resp: 18  Temp: 97 F (36.1 C)  SpO2: 93%  Weight: 178 lb (80.7 kg)  Height: 5\' 8"  (1.727 m)

## 2016-01-01 ENCOUNTER — Telehealth: Payer: Self-pay

## 2016-01-01 ENCOUNTER — Ambulatory Visit (INDEPENDENT_AMBULATORY_CARE_PROVIDER_SITE_OTHER): Payer: PPO

## 2016-01-01 ENCOUNTER — Other Ambulatory Visit: Payer: Self-pay | Admitting: Family Medicine

## 2016-01-01 ENCOUNTER — Ambulatory Visit (INDEPENDENT_AMBULATORY_CARE_PROVIDER_SITE_OTHER): Payer: PPO | Admitting: Family Medicine

## 2016-01-01 ENCOUNTER — Encounter: Payer: Self-pay | Admitting: Family Medicine

## 2016-01-01 ENCOUNTER — Ambulatory Visit (INDEPENDENT_AMBULATORY_CARE_PROVIDER_SITE_OTHER): Payer: PPO | Admitting: General Practice

## 2016-01-01 ENCOUNTER — Ambulatory Visit (INDEPENDENT_AMBULATORY_CARE_PROVIDER_SITE_OTHER)
Admission: RE | Admit: 2016-01-01 | Discharge: 2016-01-01 | Disposition: A | Discharge status: Home / Self Care | Payer: PPO | Source: Ambulatory Visit | Attending: Family Medicine | Admitting: Family Medicine

## 2016-01-01 VITALS — BP 128/60 | HR 72 | Temp 97.9°F

## 2016-01-01 DIAGNOSIS — I639 Cerebral infarction, unspecified: Secondary | ICD-10-CM

## 2016-01-01 DIAGNOSIS — Z95810 Presence of automatic (implantable) cardiac defibrillator: Secondary | ICD-10-CM

## 2016-01-01 DIAGNOSIS — I5022 Chronic systolic (congestive) heart failure: Secondary | ICD-10-CM

## 2016-01-01 DIAGNOSIS — R1032 Left lower quadrant pain: Secondary | ICD-10-CM

## 2016-01-01 DIAGNOSIS — Z5181 Encounter for therapeutic drug level monitoring: Secondary | ICD-10-CM | POA: Diagnosis not present

## 2016-01-01 DIAGNOSIS — I4891 Unspecified atrial fibrillation: Secondary | ICD-10-CM

## 2016-01-01 LAB — COMPREHENSIVE METABOLIC PANEL
ALT: 11 U/L (ref 0–53)
AST: 13 U/L (ref 0–37)
Albumin: 4 g/dL (ref 3.5–5.2)
Alkaline Phosphatase: 61 U/L (ref 39–117)
BUN: 39 mg/dL — ABNORMAL HIGH (ref 6–23)
CALCIUM: 9.1 mg/dL (ref 8.4–10.5)
CHLORIDE: 107 meq/L (ref 96–112)
CO2: 26 meq/L (ref 19–32)
CREATININE: 2.05 mg/dL — AB (ref 0.40–1.50)
GFR: 33.39 mL/min — ABNORMAL LOW (ref >60.00)
GLUCOSE: 135 mg/dL — AB (ref 70–99)
Potassium: 5.2 mEq/L — ABNORMAL HIGH (ref 3.5–5.1)
SODIUM: 141 meq/L (ref 135–145)
Total Bilirubin: 0.9 mg/dL (ref 0.2–1.2)
Total Protein: 6.8 g/dL (ref 6.0–8.3)

## 2016-01-01 LAB — CBC WITH DIFFERENTIAL/PLATELET
BASOS PCT: 0.5 % (ref 0.0–3.0)
Basophils Absolute: 0 10*3/uL (ref 0.0–0.1)
EOS ABS: 0.1 10*3/uL (ref 0.0–0.7)
Eosinophils Relative: 1.4 % (ref 0.0–5.0)
HEMATOCRIT: 41 % (ref 39.0–52.0)
Hemoglobin: 13.6 g/dL (ref 13.0–17.0)
LYMPHS ABS: 1.4 10*3/uL (ref 0.7–4.0)
Lymphocytes Relative: 16.5 % (ref 12.0–46.0)
MCHC: 33.2 g/dL (ref 30.0–36.0)
MCV: 85.4 fl (ref 78.0–100.0)
MONO ABS: 0.7 10*3/uL (ref 0.1–1.0)
Monocytes Relative: 8.3 % (ref 3.0–12.0)
NEUTROS ABS: 6.2 10*3/uL (ref 1.4–7.7)
NEUTROS PCT: 73.3 % (ref 43.0–77.0)
PLATELETS: 163 10*3/uL (ref 150.0–400.0)
RBC: 4.81 Mil/uL (ref 4.22–5.81)
RDW: 15.6 % — AB (ref 11.5–15.5)
WBC: 8.5 10*3/uL (ref 4.0–10.5)

## 2016-01-01 LAB — POCT INR: INR: 2.5

## 2016-01-01 MED ORDER — METRONIDAZOLE 500 MG PO TABS
500.0000 mg | ORAL_TABLET | Freq: Three times a day (TID) | ORAL | 0 refills | Status: DC
Start: 2016-01-01 — End: 2016-02-14

## 2016-01-01 MED ORDER — CIPROFLOXACIN HCL 500 MG PO TABS: 500.0000 mg | ORAL_TABLET | Freq: Two times a day (BID) | ORAL | 0 refills | Status: DC

## 2016-01-01 NOTE — Patient Instructions (Addendum)
Please go to the lab for blood work. Also, you will be contacted regarding your CT scan. Results will be sent to you and recommended follow up and plan will be determined.   Abdominal Pain, Adult Many things can cause abdominal pain. Usually, abdominal pain is not caused by a disease and will improve without treatment. It can often be observed and treated at home. Your health care provider will do a physical exam and possibly order blood tests and X-rays to help determine the seriousness of your pain. However, in many cases, more time must pass before a clear cause of the pain can be found. Before that point, your health care provider may not know if you need more testing or further treatment. HOME CARE INSTRUCTIONS Monitor your abdominal pain for any changes. The following actions may help to alleviate any discomfort you are experiencing:  Only take over-the-counter or prescription medicines as directed by your health care provider.  Do not take laxatives unless directed to do so by your health care provider.  Try a clear liquid diet (broth, tea, or water) as directed by your health care provider. Slowly move to a bland diet as tolerated. SEEK MEDICAL CARE IF:  You have unexplained abdominal pain.  You have abdominal pain associated with nausea or diarrhea.  You have pain when you urinate or have a bowel movement.  You experience abdominal pain that wakes you in the night.  You have abdominal pain that is worsened or improved by eating food.  You have abdominal pain that is worsened with eating fatty foods.  You have a fever. SEEK IMMEDIATE MEDICAL CARE IF:  Your pain does not go away within 2 hours.  You keep throwing up (vomiting).  Your pain is felt only in portions of the abdomen, such as the right side or the left lower portion of the abdomen.  You pass bloody or black tarry stools. MAKE SURE YOU:  Understand these instructions.  Will watch your condition.  Will get  help right away if you are not doing well or get worse.   This information is not intended to replace advice given to you by your health care provider. Make sure you discuss any questions you have with your health care provider.   Document Released: 11/28/2004 Document Revised: 11/09/2014 Document Reviewed: 10/28/2012 Elsevier Interactive Patient Education [employer].

## 2016-01-01 NOTE — Progress Notes (Signed)
Subjective:    Patient ID: [name], male    DOB: 09/12/1934, 81 y.o.   MRN: [id number]  HPI  [name] is a 81 year old male who presents today with lower abdominal pain that has been present for 2 days.  Pain is rated as an 8 and noted as aching with movement. Pain is not better or worse and is triggered with movement. He denies fever, chills, sweats, N/V/D, hematochezia, melena,narrow caliber stool, or symptoms of GERD. Denies chest pain, palpitations, SOB, change in urine or stool.  No history of a colonoscopy. He has a recent history of an abdominal aortogram in 11/2015 and left AKA. He is followed also by cardiology and his next recommended follow up is in one year. Treatment at home includes aleve has provided moderate benefit. Pain is triggered with movement and relieved with rest and use of an abdominal binder.  Last BM today and reported as soft and "normal".    Review of Systems  Constitutional: Negative for chills, fatigue and fever.  HENT: Negative for postnasal drip and rhinorrhea.   Respiratory: Negative for cough, shortness of breath and wheezing.   Cardiovascular: Negative for chest pain and palpitations.  Gastrointestinal: Positive for abdominal pain. Negative for constipation, diarrhea, nausea and vomiting.  Genitourinary: Negative for dysuria, frequency, hematuria and urgency.  Musculoskeletal: Negative for back pain and myalgias.  Skin: Negative for rash.  Neurological: Negative for dizziness, light-headedness, numbness and headaches.   Past Medical History:  Diagnosis Date  . Arthritis   . Atrial fibrillation ([place])    takes Warfarin daily  . Automatic implantable cardioverter-defibrillator in situ   . Benign prostatic hypertrophy    takes Proscar daily  . CAD (coronary artery disease)    a. s/p mid LAD stenting with DES 2008 Dr. Daneen Schick  . CHF (congestive heart failure) ([place])    takes Lasix daily  . Chronic systolic dysfunction of left ventricle     a. mixed ischemic and nonischemic CM,  EF 35%. b. s/p AICD implantation.  . Constipation    takes Miralax daily as needed and Colace daily   . Depression   . ED (erectile dysfunction)   . Gout   . History of MRSA infection   . Hypertension    takes Carvedilol and Losartan daily  . ICD (implantable cardiac defibrillator) in place    medtronic, Dr. Rayann Heman  . ICD (implantable cardiac defibrillator) in place   . Insomnia    TAKES TRAZODONE NIGHTLY  . Numbness and tingling    Hx; 66f left foot  . Pacemaker    medtronic  . PAD (peripheral artery disease) (HCC)    Severe by PV angiogram 09/2011  . PAD (peripheral artery disease) (H[facility])    s/p multiple LLE bypass grafts; left mid-distal SCA occlusion by 08/2012 duplex  . Renal artery stenosis ([place])    s/p stenting 2009  . Sleep apnea    hx of "had surgery for"  . Type II diabetes mellitus ([place])    takes Novolog 70/30     Social History   Social History  . Marital status: Widowed    Spouse name: N/A  . Number of children: N/A  . Years of education: N/A   Occupational History  . Retired Retired   Social History Main Topics  . Smoking status: Current Some Day Smoker    Types: Cigars  . Smokeless tobacco: Never Used     Comment: 1 cigar each month  .  Alcohol use 0.0 oz/week     Comment: once a week wine  . Drug use: No  . Sexual activity: Not Currently   Other Topics Concern  . Not on file   Social History Narrative   Lives in [place] with significant other,  [name].    Past Surgical History:  Procedure Laterality Date  . ABDOMINAL ANGIOGRAM  12/25/2011   Procedure: ABDOMINAL ANGIOGRAM;  Surgeon: Serafina Mitchell, MD;  Location: [facility] CATH LAB;  Service: Cardiovascular;;  . ABDOMINAL AORTAGRAM N/A 09/11/2011   Procedure: ABDOMINAL Maxcine Ham;  Surgeon: Wellington Hampshire, MD;  Location: [facility] CATH LAB;  Service: Cardiovascular;  Laterality: N/A;  . ADENOIDECTOMY     Hx; of   . AMPUTATION Left 02/04/2013   Procedure:  AMPUTATION ABOVE KNEE-LEFT;  Surgeon: Mal Misty, MD;  Location: [place];  Service: Vascular;  Laterality: Left;  . BICEPS TENDON REPAIR Right 2001   Archie Endo 03/20/2001 (07/10/2012)  . BIV ICD GENERTAOR CHANGE OUT N/A 11/09/2013   Procedure: BIV ICD GENERTAOR CHANGE OUT;  Surgeon: Coralyn Mark, MD;  Location: [facility] CATH LAB;  Service: Cardiovascular;  Laterality: N/A;  . CARDIAC DEFIBRILLATOR PLACEMENT  12/26/09   pacemaker combo  . CARPAL TUNNEL RELEASE Right 2002   Archie Endo 03/20/2001 (07/10/2012)  . cervical epidural injection  2013  . COLONOSCOPY     Hx; of  . CORONARY ANGIOPLASTY    . CORONARY ANGIOPLASTY WITH STENT PLACEMENT  ~ 2000  . EMBOLECTOMY  02/07/2012   Procedure: EMBOLECTOMY;  Surgeon: Rosetta Posner, MD;  Location: [place];  Service: Vascular;  Laterality: Left;  . EMBOLECTOMY Left 12/16/2012   Procedure: THROMBECTOMY  LEFT LEG BYPASS;  Surgeon: Elam Dutch, MD;  Location: [place];  Service: Vascular;  Laterality: Left;  . FEMORAL-POPLITEAL BYPASS GRAFT Left 09/16/2012   Procedure: LEFT FEMORAL-POPLITEAL BYPASS GRAFT WITH GORTEX Propaten Graft 6x80 Thin Wall and Left lower leg Angiogram;  Surgeon: Mal Misty, MD;  Location: [place];  Service: Vascular;  Laterality: Left;  . FEMORAL-TIBIAL BYPASS GRAFT  09/25/2011   Procedure: BYPASS GRAFT FEMORAL-TIBIAL ARTERY;  Surgeon: Mal Misty, MD;  Location: [facility] OR;  Service: Vascular;  Laterality: Left;  Left Femoral - Anterior Tibial Bypass;  saphenous vein graft from left leg  . FEMORAL-TIBIAL BYPASS GRAFT  02/07/2012   Procedure: BYPASS GRAFT FEMORAL-TIBIAL ARTERY;  Surgeon: Rosetta Posner, MD;  Location: [place];  Service: Vascular;  Laterality: Left;  Thrombectomy Left Femoral - Tibial Bypass Graft  . FEMORAL-TIBIAL BYPASS GRAFT  04/03/2012   Procedure: BYPASS GRAFT FEMORAL-TIBIAL ARTERY;  Surgeon: Mal Misty, MD;  Location: [place];  Service: Vascular;  Laterality: Left;  Redo  . FEMORAL-TIBIAL BYPASS GRAFT Left 09/30/2012   Procedure: REDO  LEFT FEMORAL-ANTERIOR TIBIAL ARTERY BYPASS USING COMPOSITE CEPHALIC AND BASILIC VEIN GRAFT FROM LEFT ARM;  Surgeon: Mal Misty, MD;  Location: [place];  Service: Vascular;  Laterality: Left;  . FOOT SURGERY Right 03/20/2001   "have plates and screws in; didn't break it" (07/10/2012)  . GROIN DEBRIDEMENT Left 11/12/2012   Procedure: CLOSURE INGUINAL WOUND;  Surgeon: Mal Misty, MD;  Location: [place];  Service: Vascular;  Laterality: Left;  . I&D EXTREMITY Left 10/21/2012   Procedure: EXPLORATION AND DEBRIDEMENT OF LEFT GROIN WOUND;  Surgeon: Mal Misty, MD;  Location: [place];  Service: Vascular;  Laterality: Left;  . INSERT / REPLACE / REMOVE PACEMAKER  2007  . INTRAOPERATIVE ARTERIOGRAM  09/25/2011   Procedure:  INTRA OPERATIVE ARTERIOGRAM;  Surgeon: Mal Misty, MD;  Location: [place];  Service: Vascular;  Laterality: Left;  . LEG AMPUTATION ABOVE KNEE Left 02/04/2013   DR LAWSON  . LOWER EXTREMITY ANGIOGRAM Left 12/25/2011   Procedure: LOWER EXTREMITY ANGIOGRAM;  Surgeon: Serafina Mitchell, MD;  Location: [facility] CATH LAB;  Service: Cardiovascular;  Laterality: Left;  lt leg angio co2  . LOWER EXTREMITY ANGIOGRAM N/A 07/07/2012   Procedure: LOWER EXTREMITY ANGIOGRAM;  Surgeon: Conrad Hammond, MD;  Location: [facility] CATH LAB;  Service: Cardiovascular;  Laterality: N/A;  . LOWER EXTREMITY ANGIOGRAM N/A 09/15/2012   Procedure: LOWER EXTREMITY ANGIOGRAM;  Surgeon: Serafina Mitchell, MD;  Location: [facility] CATH LAB;  Service: Cardiovascular;  Laterality: N/A;  . PATCH ANGIOPLASTY Left 10/21/2012   Procedure: PATCH ANGIOPLASTY;  Surgeon: Mal Misty, MD;  Location: [place];  Service: Vascular;  Laterality: Left;  . PERIPHERAL VASCULAR CATHETERIZATION N/A 11/08/2015   Procedure: Abdominal Aortogram w/Lower Extremity;  Surgeon: Waynetta Sandy, MD;  Location: [place] CV LAB;  Service: Cardiovascular;  Laterality: N/A;  . REMOVAL OF GRAFT Left 04/16/2013   Procedure: I & D LEFT AKA WOUND, POSSIBLE REMOVAL OF  INFECTED GORTEX GRAFT;  Surgeon: Mal Misty, MD;  Location: [place];  Service: Vascular;  Laterality: Left;  . RENAL ARTERY STENT  2009  . SHOULDER OPEN ROTATOR CUFF REPAIR Right 2001   repair of lacerated right/notes 10/11/1999  (07/10/2012)  . TONSILLECTOMY    . TURP VAPORIZATION    . UPPER EXTREMITY ANGIOGRAM  09/15/2012   Procedure: UPPER EXTREMITY ANGIOGRAM;  Surgeon: Serafina Mitchell, MD;  Location: [facility] CATH LAB;  Service: Cardiovascular;;  . UVULOPALATOPHARYNGOPLASTY (UPPP)/TONSILLECTOMY/SEPTOPLASTY  06/30/2003   Archie Endo 06/30/2003 (07/10/2012)    Family History  Problem Relation Age of Onset  . Cancer Father   . Cancer      breast/fhx  . Heart disease      fhx  . Diabetes Neg Hx     Allergies  Allergen Reactions  . Other Other (See Comments)    Plastic tape tears skin  . Bactrim Ds [Sulfamethoxazole-Trimethoprim] Other (See Comments)    Hand tremors    Current Outpatient Prescriptions on File Prior to Visit  Medication Sig Dispense Refill  . allopurinol (ZYLOPRIM) 100 MG tablet Take 1 tablet (100 mg total) by mouth daily as needed (Gout). 90 tablet 3  . amLODipine (NORVASC) 5 MG tablet Take 1 tablet (5 mg total) by mouth daily. 90 tablet 3  . aspirin EC 81 MG tablet Take 81 mg by mouth daily.    . carvedilol (COREG) 25 MG tablet Take 1 tablet (25 mg total) by mouth 2 (two) times daily with a meal. 180 tablet 3  . clopidogrel (PLAVIX) 75 MG tablet Take 1 tablet (75 mg total) by mouth daily. 30 tablet 3  . DULoxetine (CYMBALTA) 20 MG capsule Take 1 capsule (20 mg total) by mouth daily. 30 capsule 2  . finasteride (PROSCAR) 5 MG tablet Take 1 tablet (5 mg total) by mouth daily. 90 tablet 3  . furosemide (LASIX) 40 MG tablet Take 1 tablet (40 mg total) by mouth daily.    Marland Kitchen gabapentin (NEURONTIN) 300 MG capsule Take 2 capsules (600 mg total) by mouth 3 (three) times daily. 540 capsule 3  . insulin aspart protamine- aspart (NOVOLOG MIX 70/30) (70-30) 100 UNIT/ML injection Inject 7  Units into the skin 2 (two) times daily with a meal.    . Insulin Pen Needle  31G X 8 MM MISC Use 2-3 times per day and diagnosis code is 250.00 100 each 1  . losartan (COZAAR) 100 MG tablet Take 1 tablet (100 mg total) by mouth daily. 90 tablet 3  . nitroGLYCERIN (NITROSTAT) 0.4 MG SL tablet Place 1 tablet (0.4 mg total) under the tongue every 5 (five) minutes as needed for chest pain. For chest pain, max 3 doses 90 tablet 3  . ONE TOUCH ULTRA TEST test strip USE TO TEST BLOOD SUGAR TWO TIMES DAILY AS INSTRUCTED BY PHYSICIAN. 100 each 0  . oxyCODONE-acetaminophen (PERCOCET) 10-325 MG tablet Take 1 tablet by mouth every 6 (six) hours as needed for pain. 90 tablet 0  . predniSONE (DELTASONE) 10 MG tablet 40 mg x 4 days, 20 mg x 3 days, 10 mg x 2 days 24 tablet 0  . sildenafil (VIAGRA) 100 MG tablet Take as directed 5 tablet 0  . warfarin (COUMADIN) 5 MG tablet TAKE AS DIRECTED BY ANTICOAGULATION CLINIC (Patient taking differently: Take 2.5 mg by mouth daily. ) 60 tablet 1   No current facility-administered medications on file prior to visit.     BP 128/60 (BP Location: Right Arm, Patient Position: Sitting, Cuff Size: Normal)   Pulse 72   Temp 97.9 F (36.6 C) (Oral)   SpO2 97%       Objective:   Physical Exam  Constitutional: He is oriented to person, place, and time. He appears well-developed and well-nourished.  Presents in wheelchair since patient did not use his left prosthesis this morning. Exam information below completed while patient was sitting in a wheelchair   Eyes: Pupils are equal, round, and reactive to light. No scleral icterus.  Neck: Neck supple.  Cardiovascular: Normal rate and regular rhythm.   Pulmonary/Chest: Effort normal and breath sounds normal. He has no wheezes. He has no rales.  Abdominal: Soft. Bowel sounds are normal. He exhibits no distension. There is tenderness. There is no rigidity, no rebound, no guarding, no CVA tenderness, no tenderness at McBurney's  point and negative Murphy's sign.  Musculoskeletal: He exhibits no edema.  Left AKA present  Lymphadenopathy:    He has no cervical adenopathy.  Neurological: He is alert and oriented to person, place, and time.  Skin: Skin is warm, dry and intact.  Psychiatric: He has a normal mood and affect. His behavior is normal. Judgment and thought content normal.      Assessment & Plan:  1. Left lower quadrant pain VSS; no peritoneal signs present. No previous history of colonoscopy to review. We discussed some potential reasons for discomfort that may include pain caused by arterial disease such as bowel ischemia which is unlikely due to recent abdominal aortagram, pain from possible diverticulitis, and pain related to possible adhesions from prior history. Will obtain blood work for signs of infection, kidney function, and CT of abdomen and pelvis. Ordered placed and scan is scheduled for later this afternoon.   - CBC with Differential/Platelet - Comprehensive metabolic panel - CT Abdomen Pelvis W Contrast; Future  Consulted with patient's PCP Dr. Sarajane Jews regarding treatment plan and he is in agreement with plan. Advised patient that plan and follow up will be determined after lab work and CT scan. Patient voiced understanding and agreed with plan.  Delano Metz, FNP-C

## 2016-01-01 NOTE — Telephone Encounter (Signed)
Attempted ICM call to patient and left message to send ICM remote transmission.  

## 2016-01-01 NOTE — Progress Notes (Signed)
Pre visit review using our clinic review tool, if applicable. No additional management support is needed unless otherwise documented below in the visit note. 

## 2016-01-01 NOTE — Patient Instructions (Signed)
Pre visit review using our clinic review tool, if applicable. No additional management support is needed unless otherwise documented below in the visit note. 

## 2016-01-01 NOTE — Progress Notes (Signed)
Cipro 500 mg BID  and metronidazole 500 mg TID has been sent into pharmacy for patient as recommended by Dr. Ardis Hughs. Advised patient that he will be called tomorrow by [facility] for recommended follow up. Further advised patient that these medications can interfere with warfarin and he will need to follow up by the end of the week for a check of his INR.

## 2016-01-02 ENCOUNTER — Encounter: Payer: PPO | Attending: Physical Medicine & Rehabilitation | Admitting: Physical Medicine & Rehabilitation

## 2016-01-02 ENCOUNTER — Encounter: Payer: Self-pay | Admitting: Physical Medicine & Rehabilitation

## 2016-01-02 ENCOUNTER — Telehealth: Payer: Self-pay | Admitting: Gastroenterology

## 2016-01-02 ENCOUNTER — Telehealth: Payer: Self-pay

## 2016-01-02 VITALS — BP 141/84 | HR 69 | Resp 14

## 2016-01-02 DIAGNOSIS — Z89612 Acquired absence of left leg above knee: Secondary | ICD-10-CM | POA: Diagnosis not present

## 2016-01-02 DIAGNOSIS — Z89619 Acquired absence of unspecified leg above knee: Secondary | ICD-10-CM | POA: Diagnosis not present

## 2016-01-02 DIAGNOSIS — M79672 Pain in left foot: Secondary | ICD-10-CM | POA: Diagnosis not present

## 2016-01-02 DIAGNOSIS — G546 Phantom limb syndrome with pain: Secondary | ICD-10-CM

## 2016-01-02 DIAGNOSIS — S78112A Complete traumatic amputation at level between left hip and knee, initial encounter: Secondary | ICD-10-CM

## 2016-01-02 DIAGNOSIS — S78119A Complete traumatic amputation at level between unspecified hip and knee, initial encounter: Secondary | ICD-10-CM

## 2016-01-02 MED ORDER — OXYCODONE-ACETAMINOPHEN 10-325 MG PO TABS: 1.0000 | ORAL_TABLET | Freq: Four times a day (QID) | ORAL | 0 refills | Status: DC | PRN

## 2016-01-02 NOTE — Progress Notes (Signed)
EPIC Encounter for ICM Monitoring  Patient Name: [name] is a 81 y.o. male Date: 01/02/2016 Primary Care Physican: Laurey Morale, MD Primary Cardiologist:Smith Electrophysiologist: Allred Dry Weight:    Unknown - amputee Bi-V Pacing:  99.8%    Clinical Status Since 10-Nov-2015 Time in AT/AF 24.0 hr/day (100.0%) Longest AT/AF 35 days             Attempted ICM call and unable to reach.  Transmission reviewed.   Thoracic impedance close to baseline but has had some fluid accumulation in the last 2 weeks.   Labs: 11/08/2015 Creatinine 2.00, BUN 49, Potassium 5.3, Sodium 139 08/28/2015 Creatinine 1.75, BUN 26, Potassium 5.2, Sodium 141  Follow-up plan: ICM clinic phone appointment on 02/02/2016.  Copy of ICM check sent to device physician.   ICM trend: 01/01/2016       Rosalene Billings, RN 01/02/2016 10:11 AM

## 2016-01-02 NOTE — Progress Notes (Addendum)
Received call back.  Patient denied any fluid symptoms but is taking antibiotics for coloitis.  Advised to limit salt intake to 2000 mg daily and to review food labels for salt amount.   Next ICM remote transmission 02/02/2016.  Patient gave permission to leave detailed message on home and cell phone.

## 2016-01-02 NOTE — Progress Notes (Signed)
Subjective:    Patient ID: [name], male    DOB: [birth year], 81 y.o.   MRN: [id number]  HPI   [name] is here in follow up of his chronic pain. He has developed a colitis over the weekend. He is currently on cipro and flagyl for this. I reviewed the CT performed yesteday and it shows thickening and changes in the sigmoid colon c/w colitis. He also has kidney stones.  His phantom pain continues to be a problem. He is trying to walk more on his left AKA. He isn't sure that the addition of cymbalta has made ha difference in the pain.  He continues on percocet as prescribed.  Pain Inventory Average Pain 7 Pain Right Now 5 My pain is sharp and stabbing  In the last 24 hours, has pain interfered with the following? General activity 5 Relation with others 5 Enjoyment of life 5 What TIME of day is your pain at its worst? evening and night Sleep (in general) NA  Pain is worse with: walking and standing Pain improves with: rest and medication Relief from Meds: 7  Mobility use a cane use a walker how many minutes can you walk? 3 ability to climb steps?  no do you drive?  yes  Function retired  Neuro/Psych trouble walking  Prior Studies CT/MRI  Physicians involved in your care Any changes since last visit?  no   Family History  Problem Relation Age of Onset  . Cancer Father   . Cancer      breast/fhx  . Heart disease      fhx  . Diabetes Neg Hx    Social History   Social History  . Marital status: Widowed    Spouse name: N/A  . Number of children: N/A  . Years of education: N/A   Occupational History  . Retired Retired   Social History Main Topics  . Smoking status: Current Some Day Smoker    Types: Cigars  . Smokeless tobacco: Never Used     Comment: 1 cigar each month  . Alcohol use 0.0 oz/week     Comment: once a week wine  . Drug use: No  . Sexual activity: Not Currently   Other Topics Concern  . None   Social History Narrative     Lives in [place] with significant other,  [name].   Past Surgical History:  Procedure Laterality Date  . ABDOMINAL ANGIOGRAM  12/25/2011   Procedure: ABDOMINAL ANGIOGRAM;  Surgeon: Serafina Mitchell, MD;  Location: [facility] CATH LAB;  Service: Cardiovascular;;  . ABDOMINAL AORTAGRAM N/A 09/11/2011   Procedure: ABDOMINAL Maxcine Ham;  Surgeon: Wellington Hampshire, MD;  Location: [place] CATH LAB;  Service: Cardiovascular;  Laterality: N/A;  . ADENOIDECTOMY     Hx; of   . AMPUTATION Left 02/04/2013   Procedure: AMPUTATION ABOVE KNEE-LEFT;  Surgeon: Mal Misty, MD;  Location: [place];  Service: Vascular;  Laterality: Left;  . BICEPS TENDON REPAIR Right 2001   Archie Endo 03/20/2001 (07/10/2012)  . BIV ICD GENERTAOR CHANGE OUT N/A 11/09/2013   Procedure: BIV ICD GENERTAOR CHANGE OUT;  Surgeon: Coralyn Mark, MD;  Location: [facility] CATH LAB;  Service: Cardiovascular;  Laterality: N/A;  . CARDIAC DEFIBRILLATOR PLACEMENT  12/26/09   pacemaker combo  . CARPAL TUNNEL RELEASE Right 2002   Archie Endo 03/20/2001 (07/10/2012)  . cervical epidural injection  2013  . COLONOSCOPY     Hx; of  . CORONARY ANGIOPLASTY    . CORONARY ANGIOPLASTY  WITH STENT PLACEMENT  ~ 2000  . EMBOLECTOMY  02/07/2012   Procedure: EMBOLECTOMY;  Surgeon: Rosetta Posner, MD;  Location: [place];  Service: Vascular;  Laterality: Left;  . EMBOLECTOMY Left 12/16/2012   Procedure: THROMBECTOMY  LEFT LEG BYPASS;  Surgeon: Elam Dutch, MD;  Location: [place];  Service: Vascular;  Laterality: Left;  . FEMORAL-POPLITEAL BYPASS GRAFT Left 09/16/2012   Procedure: LEFT FEMORAL-POPLITEAL BYPASS GRAFT WITH GORTEX Propaten Graft 6x80 Thin Wall and Left lower leg Angiogram;  Surgeon: Mal Misty, MD;  Location: [place];  Service: Vascular;  Laterality: Left;  . FEMORAL-TIBIAL BYPASS GRAFT  09/25/2011   Procedure: BYPASS GRAFT FEMORAL-TIBIAL ARTERY;  Surgeon: Mal Misty, MD;  Location: [facility] OR;  Service: Vascular;  Laterality: Left;  Left Femoral - Anterior Tibial Bypass;   saphenous vein graft from left leg  . FEMORAL-TIBIAL BYPASS GRAFT  02/07/2012   Procedure: BYPASS GRAFT FEMORAL-TIBIAL ARTERY;  Surgeon: Rosetta Posner, MD;  Location: [place];  Service: Vascular;  Laterality: Left;  Thrombectomy Left Femoral - Tibial Bypass Graft  . FEMORAL-TIBIAL BYPASS GRAFT  04/03/2012   Procedure: BYPASS GRAFT FEMORAL-TIBIAL ARTERY;  Surgeon: Mal Misty, MD;  Location: [place];  Service: Vascular;  Laterality: Left;  Redo  . FEMORAL-TIBIAL BYPASS GRAFT Left 09/30/2012   Procedure: REDO LEFT FEMORAL-ANTERIOR TIBIAL ARTERY BYPASS USING COMPOSITE CEPHALIC AND BASILIC VEIN GRAFT FROM LEFT ARM;  Surgeon: Mal Misty, MD;  Location: [place];  Service: Vascular;  Laterality: Left;  . FOOT SURGERY Right 03/20/2001   "have plates and screws in; didn't break it" (07/10/2012)  . GROIN DEBRIDEMENT Left 11/12/2012   Procedure: CLOSURE INGUINAL WOUND;  Surgeon: Mal Misty, MD;  Location: [place];  Service: Vascular;  Laterality: Left;  . I&D EXTREMITY Left 10/21/2012   Procedure: EXPLORATION AND DEBRIDEMENT OF LEFT GROIN WOUND;  Surgeon: Mal Misty, MD;  Location: [place];  Service: Vascular;  Laterality: Left;  . INSERT / REPLACE / REMOVE PACEMAKER  2007  . INTRAOPERATIVE ARTERIOGRAM  09/25/2011   Procedure: INTRA OPERATIVE ARTERIOGRAM;  Surgeon: Mal Misty, MD;  Location: [place];  Service: Vascular;  Laterality: Left;  . LEG AMPUTATION ABOVE KNEE Left 02/04/2013   DR [facility]  . LOWER EXTREMITY ANGIOGRAM Left 12/25/2011   Procedure: LOWER EXTREMITY ANGIOGRAM;  Surgeon: Serafina Mitchell, MD;  Location: [facility] CATH LAB;  Service: Cardiovascular;  Laterality: Left;  lt leg angio co2  . LOWER EXTREMITY ANGIOGRAM N/A 07/07/2012   Procedure: LOWER EXTREMITY ANGIOGRAM;  Surgeon: Conrad Watonga, MD;  Location: [facility] CATH LAB;  Service: Cardiovascular;  Laterality: N/A;  . LOWER EXTREMITY ANGIOGRAM N/A 09/15/2012   Procedure: LOWER EXTREMITY ANGIOGRAM;  Surgeon: Serafina Mitchell, MD;  Location: [facility] CATH LAB;   Service: Cardiovascular;  Laterality: N/A;  . PATCH ANGIOPLASTY Left 10/21/2012   Procedure: PATCH ANGIOPLASTY;  Surgeon: Mal Misty, MD;  Location: [place];  Service: Vascular;  Laterality: Left;  . PERIPHERAL VASCULAR CATHETERIZATION N/A 11/08/2015   Procedure: Abdominal Aortogram w/Lower Extremity;  Surgeon: Waynetta Sandy, MD;  Location: [place] CV LAB;  Service: Cardiovascular;  Laterality: N/A;  . REMOVAL OF GRAFT Left 04/16/2013   Procedure: I & D LEFT AKA WOUND, POSSIBLE REMOVAL OF INFECTED GORTEX GRAFT;  Surgeon: Mal Misty, MD;  Location: [place];  Service: Vascular;  Laterality: Left;  . RENAL ARTERY STENT  2009  . SHOULDER OPEN ROTATOR CUFF REPAIR Right 2001   repair of lacerated  right/notes 10/11/1999  (07/10/2012)  . TONSILLECTOMY    . TURP VAPORIZATION    . UPPER EXTREMITY ANGIOGRAM  09/15/2012   Procedure: UPPER EXTREMITY ANGIOGRAM;  Surgeon: Serafina Mitchell, MD;  Location: [facility] CATH LAB;  Service: Cardiovascular;;  . UVULOPALATOPHARYNGOPLASTY (UPPP)/TONSILLECTOMY/SEPTOPLASTY  06/30/2003   Archie Endo 06/30/2003 (07/10/2012)   Past Medical History:  Diagnosis Date  . Arthritis   . Atrial fibrillation ([place])    takes Warfarin daily  . Automatic implantable cardioverter-defibrillator in situ   . Benign prostatic hypertrophy    takes Proscar daily  . CAD (coronary artery disease)    a. s/p mid LAD stenting with DES 2008 Dr. Daneen Schick  . CHF (congestive heart failure) ([place])    takes Lasix daily  . Chronic systolic dysfunction of left ventricle    a. mixed ischemic and nonischemic CM,  EF 35%. b. s/p AICD implantation.  . Constipation    takes Miralax daily as needed and Colace daily   . Depression   . ED (erectile dysfunction)   . Gout   . History of MRSA infection   . Hypertension    takes Carvedilol and Losartan daily  . ICD (implantable cardiac defibrillator) in place    medtronic, Dr. Rayann Heman  . ICD (implantable cardiac defibrillator) in place   . Insomnia     TAKES TRAZODONE NIGHTLY  . Numbness and tingling    Hx; 63f left foot  . Pacemaker    medtronic  . PAD (peripheral artery disease) (H[facility])    Severe by PV angiogram 09/2011  . PAD (peripheral artery disease) (H[facility])    s/p multiple LLE bypass grafts; left mid-distal SCA occlusion by 08/2012 duplex  . Renal artery stenosis ([place])    s/p stenting 2009  . Sleep apnea    hx of "had surgery for"  . Type II diabetes mellitus (HCC)    takes Novolog 70/30   BP (!) 141/84   Pulse 69   Resp 14   SpO2 96%   Opioid Risk Score:   Fall Risk Score:  `1  Depression screen PHQ 2/9  Depression screen [facility] 2/9 01/02/2016 07/10/2015 05/09/2015 11/23/2014 06/03/2014  Decreased Interest 1 0 2 2 2   Down, Depressed, Hopeless 0 0 1 1 1   PHQ - 2 Score 1 0 3 3 3   Altered sleeping - - - - 3  Tired, decreased energy - - - - 2  Change in appetite - - - - 0  Feeling bad or failure about yourself  - - - - 0  Trouble concentrating - - - - 1  Moving slowly or fidgety/restless - - - - 0  Suicidal thoughts - - - - 0  PHQ-9 Score - - - - 9  Some recent data might be hidden    Review of Systems  Respiratory: Positive for shortness of breath.   All other systems reviewed and are negative.      Objective:   Physical Exam  Constitutional: He is oriented to person, place, and time. He appears well-developed and well-nourished.  HENT:  Head: Normocephalic and atraumatic.  Eyes: Conjunctivae are normal. Pupils are equal, round, and reactive to light.  Neck: Neck supple.  Cardiovascular: Normal rate and regular rhythm. No rubs, gallops. Pacer site dressed  No murmur heard.  Respiratory: Effort normal and breath sounds normal. No respiratory distress. He has no wheezes.  GI: Soft. Bowel sounds are normal. He exhibits no distension. There is no tenderness.  Musculoskeletal:  L-AKA prosthesis socket  in place and appears to be fitting appropriately Neurological: He is alert and oriented to person, place, and time.    Follows commands. Strength grossly intact in UE's at 5/5. RLE is 5/5 prox to 5/5 distally. Left HF 5/5. mild sensory loss in distal right leg/foot.  Skin: Right foot not examined. Psychiatric: He has a normal mood and affect. He is a little flat. Judgment and thought content normal. Cognition and memory are normal.    Assessment/Plan:  1.Hx of left AKA and now with developing issues concerning there RLE/PAD -continue with ambulation at home--this is imperative -asked him to follow up with [facility] about potential adjustments to C-leg to assist with stability at his knee  2. Phantom limb pain- -continue gabapentin 300mg  tid. Hasn't been able to tolerate a higher dose or other anticonvulsants. -continue percocet to 10/325,  q6 prn. #90 -still can consider follow up with  Dr. Isaiah Blakes for sympathetic ganglion block for LLE pending his RLE mgt  3. Mood/phantom pain- consider increasing cymbalta to 30mg  at next visit/ once colitis resolves 4. Colitis---per primary team.    Follow up with me in about 2 months. 15 minutes of face to face patient care time were spent during this visit. All questions were encouraged and answered.

## 2016-01-02 NOTE — Telephone Encounter (Signed)
I spoke with Almyra Free, NP yesterday. She was going to call ina 5-7 day course of cipro/flagyl for what may be mild ischemic colitis. Can you please call him today, offer him next available rov with myself or extender.  Thanks

## 2016-01-02 NOTE — Patient Instructions (Signed)
PLEASE CALL ME WITH ANY PROBLEMS OR QUESTIONS ([phone])  

## 2016-01-02 NOTE — Telephone Encounter (Signed)
Remote ICM transmission received.  Attempted patient call and left message to return call.   

## 2016-01-02 NOTE — Telephone Encounter (Signed)
-----   Message from Barron Alvine, RN sent at 01/01/2016  4:49 PM EDT ----- Almira Coaster NP from [facility] called regarding this pt and is concerned he may have ischemic bowel.  She would like a call back to discuss.  [phone].  This is her cell number

## 2016-01-02 NOTE — Telephone Encounter (Signed)
Left message on machine to call back  

## 2016-01-03 NOTE — Telephone Encounter (Signed)
Left message on machine to call back  

## 2016-01-04 NOTE — Telephone Encounter (Signed)
I spoke with the pt and offered appointments for this week with extenders.  Pt declines all offered appts, he states he has to work.  I scheduled appt with Dr Ardis Hughs for 03/08/16 per pt request.  He will finish antibiotics and call if he has any concerns prior to appt

## 2016-01-16 NOTE — Addendum Note (Signed)
Addended by: Thresa Ross C on: 01/16/2016 02:20 PM   Modules accepted: Orders

## 2016-01-19 NOTE — Progress Notes (Signed)
ICM remote transmission rescheduled from 02/02/2016 to 02/05/2016 

## 2016-01-23 ENCOUNTER — Encounter (HOSPITAL_COMMUNITY): Payer: PPO

## 2016-01-23 ENCOUNTER — Ambulatory Visit: Payer: PPO | Admitting: Family

## 2016-01-29 ENCOUNTER — Ambulatory Visit (INDEPENDENT_AMBULATORY_CARE_PROVIDER_SITE_OTHER): Payer: PPO | Admitting: General Practice

## 2016-01-29 DIAGNOSIS — I4891 Unspecified atrial fibrillation: Secondary | ICD-10-CM

## 2016-01-29 LAB — POCT INR: INR: 2.5

## 2016-01-29 NOTE — Patient Instructions (Signed)
Pre visit review using our clinic review tool, if applicable. No additional management support is needed unless otherwise documented below in the visit note. 

## 2016-02-02 ENCOUNTER — Other Ambulatory Visit: Payer: Self-pay | Admitting: Family Medicine

## 2016-02-05 ENCOUNTER — Ambulatory Visit (INDEPENDENT_AMBULATORY_CARE_PROVIDER_SITE_OTHER): Payer: PPO

## 2016-02-05 ENCOUNTER — Telehealth: Payer: Self-pay | Admitting: Cardiology

## 2016-02-05 DIAGNOSIS — Z95810 Presence of automatic (implantable) cardiac defibrillator: Secondary | ICD-10-CM | POA: Diagnosis not present

## 2016-02-05 DIAGNOSIS — I5022 Chronic systolic (congestive) heart failure: Secondary | ICD-10-CM

## 2016-02-05 NOTE — Telephone Encounter (Signed)
LMOVM reminding pt to send remote transmission.   

## 2016-02-06 NOTE — Progress Notes (Signed)
EPIC Encounter for ICM Monitoring  Patient Name: [name] is a 81 y.o. male Date: 02/06/2016 Primary Care Physican: Laurey Morale, MD Primary Cardiologist:Smith Electrophysiologist: Allred Dry Weight:Unknown - amputee Bi-V Pacing: 99.7% Time in AT/AF 24.0 hr/day (100.0%)       Heart Failure questions reviewed, pt asymptomatic   Thoracic impedance slightly abnormal suggesting fluid accumulation but close to baseline.  Labs: 11/08/2015 Creatinine 2.00, BUN 49, Potassium 5.3,Sodium 139 08/28/2015 Creatinine 1.75, BUN 26, Potassium 5.2, Sodium 141  Recommendations:  No changes.  Reinforced to limit low salt food choices to 2000 mg day and limiting fluid intake to < 2 liters per day. Encouraged to call for fluid symptoms.    Follow-up plan: ICM clinic phone appointment on 03/09/2015.  Office appointment with Dr Tamala Julian 02/14/2016  Copy of ICM check sent to primary cardiologist and device physician.   ICM trend: 02/06/2016       Rosalene Billings, RN 02/06/2016 11:15 AM

## 2016-02-08 ENCOUNTER — Encounter (HOSPITAL_COMMUNITY): Payer: Self-pay | Admitting: *Deleted

## 2016-02-14 ENCOUNTER — Ambulatory Visit (INDEPENDENT_AMBULATORY_CARE_PROVIDER_SITE_OTHER): Payer: PPO | Admitting: Interventional Cardiology

## 2016-02-14 ENCOUNTER — Encounter: Payer: Self-pay | Admitting: Interventional Cardiology

## 2016-02-14 VITALS — BP 116/64 | HR 62 | Ht 68.0 in | Wt 183.0 lb

## 2016-02-14 DIAGNOSIS — Z95810 Presence of automatic (implantable) cardiac defibrillator: Secondary | ICD-10-CM

## 2016-02-14 DIAGNOSIS — I739 Peripheral vascular disease, unspecified: Secondary | ICD-10-CM

## 2016-02-14 DIAGNOSIS — I482 Chronic atrial fibrillation, unspecified: Secondary | ICD-10-CM

## 2016-02-14 DIAGNOSIS — I25729 Atherosclerosis of autologous artery coronary artery bypass graft(s) with unspecified angina pectoris: Secondary | ICD-10-CM

## 2016-02-14 DIAGNOSIS — I5022 Chronic systolic (congestive) heart failure: Secondary | ICD-10-CM | POA: Diagnosis not present

## 2016-02-14 DIAGNOSIS — I10 Essential (primary) hypertension: Secondary | ICD-10-CM | POA: Diagnosis not present

## 2016-02-14 DIAGNOSIS — S78119A Complete traumatic amputation at level between unspecified hip and knee, initial encounter: Secondary | ICD-10-CM

## 2016-02-14 DIAGNOSIS — N184 Chronic kidney disease, stage 4 (severe): Secondary | ICD-10-CM

## 2016-02-14 DIAGNOSIS — Z89619 Acquired absence of unspecified leg above knee: Secondary | ICD-10-CM

## 2016-02-14 MED ORDER — FUROSEMIDE 40 MG PO TABS: 60.0000 mg | ORAL_TABLET | Freq: Every day | ORAL | 3 refills | Status: DC

## 2016-02-14 NOTE — Patient Instructions (Signed)
Medication Instructions:  1) INCREASE Furosemide to 40mg  twice daily for 3 days, then START Furosemide 60mg  (1.5 tablets) once daily.  Labwork: Your physician recommends that you return for lab work in: 7-10 days (BMET)   Testing/Procedures: None  Follow-Up: Your physician wants you to follow-up in: 6 months with Dr. Tamala Julian. You will receive a reminder letter in the mail two months in advance. If you don't receive a letter, please call our office to schedule the follow-up appointment.   Any Other Special Instructions Will Be Listed Below (If Applicable).     If you need a refill on your cardiac medications before your next appointment, please call your pharmacy.

## 2016-02-14 NOTE — Progress Notes (Addendum)
Cardiology Office Note    Date:  02/14/2016   ID:  [name], DOB [birth year], MRN [id number]  PCP:  Laurey Morale, MD  Cardiologist: Sinclair Grooms, MD   Chief Complaint  Patient presents with  . Atrial Fibrillation  . Congestive Heart Failure    History of Present Illness:  [name] is a 81 y.o. 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  He complains of dyspnea on exertion and orthopnea. He gives out of air when having conversation. He has not having chest pain. No orthopnea PND. Left above-the-knee amputation. Slight difficulty with edema in the right lower extremity. He does not feel there is been any fluid in the left lower extremity stump.   Past Medical History:  Diagnosis Date  . Arthritis   . Atrial fibrillation ([place])    takes Warfarin daily  . Automatic implantable cardioverter-defibrillator in situ   . Benign prostatic hypertrophy    takes Proscar daily  . CAD (coronary artery disease)    a. s/p mid LAD stenting with DES 2008 Dr. Daneen Schick  . CHF (congestive heart failure) ([place])    takes Lasix daily  . Chronic systolic dysfunction of left ventricle    a. mixed ischemic and nonischemic CM,  EF 35%. b. s/p AICD implantation.  . Constipation    takes Miralax daily as needed and Colace daily   . Depression   . ED (erectile dysfunction)   . Gout   . History of MRSA infection   . Hypertension    takes Carvedilol and Losartan daily  . ICD (implantable cardiac defibrillator) in place    medtronic, Dr. Rayann Heman  . ICD (implantable cardiac defibrillator) in place   . Insomnia    TAKES TRAZODONE NIGHTLY  . Numbness and tingling    Hx; 97f left foot  . Pacemaker    medtronic  . PAD (peripheral artery disease) (HCC)    Severe by PV angiogram 09/2011  . PAD  (peripheral artery disease) (HCC)    s/p multiple LLE bypass grafts; left mid-distal SCA occlusion by 08/2012 duplex  . Renal artery stenosis ([place])    s/p stenting 2009  . Sleep apnea    hx of "had surgery for"  . Type II diabetes mellitus (HCC)    takes Novolog 70/30    Past Surgical History:  Procedure Laterality Date  . ABDOMINAL ANGIOGRAM  12/25/2011   Procedure: ABDOMINAL ANGIOGRAM;  Surgeon: Serafina Mitchell, MD;  Location[facility] CATH LAB;  Service: Cardiovascular;;  . ABDOMINAL AORTAGRAM N/A 09/11/2011   Procedure: ABDOMINAL Maxcine Ham;  Surgeon: Wellington Hampshire, MD;  Location: [place] CATH LAB;  Service: Cardiovascular;  Laterality: N/A;  . ADENOIDECTOMY     Hx; of   . AMPUTATION Left 02/04/2013   Procedure: AMPUTATION ABOVE KNEE-LEFT;  Surgeon: Mal Misty, MD;  Location: [place];  Service: Vascular;  Laterality: Left;  . BICEPS TENDON REPAIR Right 2001   Archie Endo 03/20/2001 (07/10/2012)  . BIV ICD GENERTAOR CHANGE OUT N/A 11/09/2013   Procedure: BIV ICD GENERTAOR CHANGE OUT;  Surgeon: Coralyn Mark, MD;  Location: [facility] CATH LAB;  Service: Cardiovascular;  Laterality: N/A;  . CARDIAC DEFIBRILLATOR PLACEMENT  12/26/09   pacemaker combo  . CARPAL TUNNEL RELEASE Right 2002   Archie Endo 03/20/2001 (07/10/2012)  . cervical epidural injection  2013  .  COLONOSCOPY     Hx; of  . CORONARY ANGIOPLASTY    . CORONARY ANGIOPLASTY WITH STENT PLACEMENT  ~ 2000  . EMBOLECTOMY  02/07/2012   Procedure: EMBOLECTOMY;  Surgeon: Rosetta Posner, MD;  Location: [place];  Service: Vascular;  Laterality: Left;  . EMBOLECTOMY Left 12/16/2012   Procedure: THROMBECTOMY  LEFT LEG BYPASS;  Surgeon: Elam Dutch, MD;  Location: [place];  Service: Vascular;  Laterality: Left;  . FEMORAL-POPLITEAL BYPASS GRAFT Left 09/16/2012   Procedure: LEFT FEMORAL-POPLITEAL BYPASS GRAFT WITH GORTEX Propaten Graft 6x80 Thin Wall and Left lower leg Angiogram;  Surgeon: Mal Misty, MD;  Location: [place];  Service: Vascular;  Laterality: Left;    . FEMORAL-TIBIAL BYPASS GRAFT  09/25/2011   Procedure: BYPASS GRAFT FEMORAL-TIBIAL ARTERY;  Surgeon: Mal Misty, MD;  [facility];  Service: Vascular;  Laterality: Left;  Left Femoral - Anterior Tibial Bypass;  saphenous vein graft from left leg  . FEMORAL-TIBIAL BYPASS GRAFT  02/07/2012   Procedure: BYPASS GRAFT FEMORAL-TIBIAL ARTERY;  Surgeon: Rosetta Posner, MD;  Location: [place];  Service: Vascular;  Laterality: Left;  Thrombectomy Left Femoral - Tibial Bypass Graft  . FEMORAL-TIBIAL BYPASS GRAFT  04/03/2012   Procedure: BYPASS GRAFT FEMORAL-TIBIAL ARTERY;  Surgeon: Mal Misty, MD;  Location: [place];  Service: Vascular;  Laterality: Left;  Redo  . FEMORAL-TIBIAL BYPASS GRAFT Left 09/30/2012   Procedure: REDO LEFT FEMORAL-ANTERIOR TIBIAL ARTERY BYPASS USING COMPOSITE CEPHALIC AND BASILIC VEIN GRAFT FROM LEFT ARM;  Surgeon: Mal Misty, MD;  Location: [place];  Service: Vascular;  Laterality: Left;  . FOOT SURGERY Right 03/20/2001   "have plates and screws in; didn't break it" (07/10/2012)  . GROIN DEBRIDEMENT Left 11/12/2012   Procedure: CLOSURE INGUINAL WOUND;  Surgeon: Mal Misty, MD;  Location: [place];  Service: Vascular;  Laterality: Left;  . I&D EXTREMITY Left 10/21/2012   Procedure: EXPLORATION AND DEBRIDEMENT OF LEFT GROIN WOUND;  Surgeon: Mal Misty, MD;  Location: [place];  Service: Vascular;  Laterality: Left;  . INSERT / REPLACE / REMOVE PACEMAKER  2007  . INTRAOPERATIVE ARTERIOGRAM  09/25/2011   Procedure: INTRA OPERATIVE ARTERIOGRAM;  Surgeon: Mal Misty, MD;  Location: [place];  Service: Vascular;  Laterality: Left;  . LEG AMPUTATION ABOVE KNEE Left 02/04/2013   DR LAWSON  . LOWER EXTREMITY ANGIOGRAM Left 12/25/2011   Procedure: LOWER EXTREMITY ANGIOGRAM;  Surgeon: Serafina Mitchell, MD;  Location: [facility] CATH LAB;  Service: Cardiovascular;  Laterality: Left;  lt leg angio co2  . LOWER EXTREMITY ANGIOGRAM N/A 07/07/2012   Procedure: LOWER EXTREMITY ANGIOGRAM;  Surgeon: Conrad Bancroft, MD;  Location: [facility] CATH LAB;  Service: Cardiovascular;  Laterality: N/A;  . LOWER EXTREMITY ANGIOGRAM N/A 09/15/2012   Procedure: LOWER EXTREMITY ANGIOGRAM;  Surgeon: Serafina Mitchell, MD;  Location: [facility] CATH LAB;  Service: Cardiovascular;  Laterality: N/A;  . PATCH ANGIOPLASTY Left 10/21/2012   Procedure: PATCH ANGIOPLASTY;  Surgeon: Mal Misty, MD;  Location: [place];  Service: Vascular;  Laterality: Left;  . PERIPHERAL VASCULAR CATHETERIZATION N/A 11/08/2015   Procedure: Abdominal Aortogram w/Lower Extremity;  Surgeon: Waynetta Sandy, MD;  Location: [place] CV LAB;  Service: Cardiovascular;  Laterality: N/A;  . REMOVAL OF GRAFT Left 04/16/2013   Procedure: I & D LEFT AKA WOUND, POSSIBLE REMOVAL OF INFECTED GORTEX GRAFT;  Surgeon: Mal Misty, MD;  Location: [place];  Service: Vascular;  Laterality: Left;  . RENAL  ARTERY STENT  2009  . SHOULDER OPEN ROTATOR CUFF REPAIR Right 2001   repair of lacerated right/notes 10/11/1999  (07/10/2012)  . TONSILLECTOMY    . TURP VAPORIZATION    . UPPER EXTREMITY ANGIOGRAM  09/15/2012   Procedure: UPPER EXTREMITY ANGIOGRAM;  Surgeon: Serafina Mitchell, MD;  Location: [facility] CATH LAB;  Service: Cardiovascular;;  . UVULOPALATOPHARYNGOPLASTY (UPPP)/TONSILLECTOMY/SEPTOPLASTY  06/30/2003   Archie Endo 06/30/2003 (07/10/2012)    Current Medications: Outpatient Medications Prior to Visit  Medication Sig Dispense Refill  . allopurinol (ZYLOPRIM) 100 MG tablet Take 1 tablet (100 mg total) by mouth daily as needed (Gout). 90 tablet 3  . amLODipine (NORVASC) 5 MG tablet Take 1 tablet (5 mg total) by mouth daily. 90 tablet 3  . aspirin EC 81 MG tablet Take 81 mg by mouth daily.    . carvedilol (COREG) 25 MG tablet Take 1 tablet (25 mg total) by mouth 2 (two) times daily with a meal. 180 tablet 3  . DULoxetine (CYMBALTA) 20 MG capsule Take 1 capsule (20 mg total) by mouth daily. 30 capsule 2  . finasteride (PROSCAR) 5 MG tablet Take 1 tablet (5 mg total) by mouth daily.  90 tablet 3  . furosemide (LASIX) 40 MG tablet Take 1 tablet (40 mg total) by mouth daily.    Marland Kitchen gabapentin (NEURONTIN) 300 MG capsule Take 2 capsules (600 mg total) by mouth 3 (three) times daily. 540 capsule 3  . insulin aspart protamine- aspart (NOVOLOG MIX 70/30) (70-30) 100 UNIT/ML injection Inject 7 Units into the skin 2 (two) times daily with a meal.    . Insulin Pen Needle 31G X 8 MM MISC Use 2-3 times per day and diagnosis code is 250.00 100 each 1  . losartan (COZAAR) 100 MG tablet Take 1 tablet (100 mg total) by mouth daily. 90 tablet 3  . nitroGLYCERIN (NITROSTAT) 0.4 MG SL tablet Place 1 tablet (0.4 mg total) under the tongue every 5 (five) minutes as needed for chest pain. For chest pain, max 3 doses 90 tablet 3  . ONE TOUCH ULTRA TEST test strip USE TO TEST BLOOD SUGAR TWO TIMES DAILY AS INSTRUCTED BY PHYSICIAN. 100 each 0  . oxyCODONE-acetaminophen (PERCOCET) 10-325 MG tablet Take 1 tablet by mouth every 6 (six) hours as needed for pain. 90 tablet 0  . sildenafil (VIAGRA) 100 MG tablet Take as directed 5 tablet 0  . warfarin (COUMADIN) 5 MG tablet TAKE AS DIRECTED BY ANTICOAGULATION CLINIC (Patient taking differently: Take 2.5 mg by mouth daily. ) 60 tablet 1  . ciprofloxacin (CIPRO) 500 MG tablet Take 1 tablet (500 mg total) by mouth 2 (two) times daily. 10 tablet 0  . metroNIDAZOLE (FLAGYL) 500 MG tablet Take 1 tablet (500 mg total) by mouth 3 (three) times daily. 15 tablet 0   No facility-administered medications prior to visit.      Allergies:   Other and Bactrim ds [sulfamethoxazole-trimethoprim]   Social History   Social History  . Marital status: Widowed    Spouse name: N/A  . Number of children: N/A  . Years of education: N/A   Occupational History  . Retired Retired   Social History Main Topics  . Smoking status: Current Some Day Smoker    Types: Cigars  . Smokeless tobacco: Never Used     Comment: 1 cigar each month  . Alcohol use 0.0 oz/week     Comment:  once a week wine  . Drug use: No  . Sexual activity: Not  Currently   Other Topics Concern  . None   Social History Narrative   Lives in [place] with significant other,  [name].     Family History:  The patient's family history includes Cancer in his father.   ROS:   Please see the history of present illness.    No other complaints than exertional fatigue, vision disturbance, muscle pain. He is concerned about skin area patient never side of his device. This is in the left subclavicular area.  All other systems reviewed and are negative.   PHYSICAL EXAM:   VS:  BP 116/64 (BP Location: Right Arm)   Pulse 62   Ht 5\' 8"  (1.727 m)   Wt 183 lb (83 kg)   BMI 27.83 kg/m    GEN: Well nourished, well developed, in no acute distress  HEENT: normal  Neck: no JVD, carotid bruits, or masses Cardiac: RRR; no murmurs, rubs, or gallops,no edema . 1+ right lower extremity edema. Left leg AKA. Respiratory:  clear to auscultation bilaterally, normal work of breathing GI: soft, nontender, nondistended, + BS MS: no deformity or atrophy. Left lower extremity AKA with prosthesis in place.  Skin: warm and dry, no rash Neuro:  Alert and Oriented x 3, Strength and sensation are intact Psych: euthymic mood, full affect  Wt Readings from Last 3 Encounters:  02/14/16 183 lb (83 kg)  12/22/15 178 lb (80.7 kg)  11/17/15 178 lb (80.7 kg)      Studies/Labs Reviewed:   EKG:  EKG  Atrial fibrillation with ventricular pacing at 62 bpm.  Recent Labs: 08/28/2015: TSH 3.18 01/01/2016: ALT 11; BUN 39; Creatinine, Ser 2.05; Hemoglobin 13.6; Platelets 163.0; Potassium 5.2; Sodium 141   Lipid Panel    Component Value Date/Time   CHOL 184 08/28/2015 0851   TRIG 76.0 08/28/2015 0851   HDL 40.60 08/28/2015 0851   CHOLHDL 5 08/28/2015 0851   VLDL 15.2 08/28/2015 0851   LDLCALC 129 (H) 08/28/2015 0851   LDLDIRECT 200.5 05/06/2012 1125    Additional studies/ records that were reviewed today include:   No data    ASSESSMENT:    1. Coronary artery disease involving autologous artery coronary bypass graft with angina pectoris ([place])   2. Chronic systolic congestive heart failure ([place])   3. Essential hypertension   4. PAD (peripheral artery disease) ([place])   5. Chronic atrial fibrillation (HCC)   6. CKD (chronic kidney disease) stage 4, GFR 15-29 ml/min (HCC)   7. S/P ICD (internal cardiac defibrillator) procedure   8. Unilateral AKA ([place])      PLAN:  In order of problems listed above:  1. Stable without recurrent angina pectoris. 2. Evidence of volume overload with elevated neck veins with the patient sitting and trace to 1+ right lower extremity edema. Increase furosemide to 40 mg twice a day for 3 days then change maintenance dose to 60 mg per day. Blood work will be done in 4-5 days. 3. Blood pressure is under good control. 4. Status post left AKA. 5. Stable without clinical concern.  6. Stage IV chronic kidney disease.  7. He is concerned about skin area tension overlying his device. We will make sure he has cirrhosis addressed when he sees the device clinic. 8. As noted above.    Medication Adjustments/Labs and Tests Ordered: Current medicines are reviewed at length with the patient today.  Concerns regarding medicines are outlined above.  Medication changes, Labs and Tests ordered today are listed in the Patient Instructions below. There  are no Patient Instructions on file for this visit.   Signed, Sinclair Grooms, MD  02/14/2016 11:22 AM    [facility] [address], [place], [place]  [address] Phone: [phone]; Fax: [phone]

## 2016-02-16 ENCOUNTER — Other Ambulatory Visit: Payer: Self-pay | Admitting: *Deleted

## 2016-02-16 MED ORDER — FUROSEMIDE 40 MG PO TABS: 60.0000 mg | ORAL_TABLET | Freq: Every day | ORAL | 3 refills | Status: DC

## 2016-02-16 NOTE — Telephone Encounter (Signed)
Received voicemail from [facility] requesting a quantity change to #135 which is equivalent to a ninety day supply. New rx sent.

## 2016-02-19 ENCOUNTER — Other Ambulatory Visit: Payer: PPO | Admitting: *Deleted

## 2016-02-19 ENCOUNTER — Telehealth: Payer: Self-pay | Admitting: *Deleted

## 2016-02-19 DIAGNOSIS — I482 Chronic atrial fibrillation, unspecified: Secondary | ICD-10-CM

## 2016-02-19 DIAGNOSIS — I5022 Chronic systolic (congestive) heart failure: Secondary | ICD-10-CM | POA: Diagnosis not present

## 2016-02-19 DIAGNOSIS — I10 Essential (primary) hypertension: Secondary | ICD-10-CM

## 2016-02-19 LAB — BASIC METABOLIC PANEL WITH GFR
BUN: 89 mg/dL — ABNORMAL HIGH (ref 7–25)
CO2: 24 mmol/L (ref 20–31)
Calcium: 8.7 mg/dL (ref 8.6–10.3)
Chloride: 104 mmol/L (ref 98–110)
Creat: 3.01 mg/dL — ABNORMAL HIGH (ref 0.70–1.18)
Glucose, Bld: 129 mg/dL — ABNORMAL HIGH (ref 65–99)
Potassium: 4.9 mmol/L (ref 3.5–5.3)
Sodium: 138 mmol/L (ref 135–146)

## 2016-02-19 MED ORDER — FUROSEMIDE 40 MG PO TABS: 40.0000 mg | ORAL_TABLET | Freq: Every day | ORAL | 3 refills | Status: DC

## 2016-02-19 NOTE — Telephone Encounter (Signed)
Pt made aware of lab results and new instructions per Dr. Tamala Julian.  Scheduled labs for 12/29.  Pt verbalized understanding and was in agreement with this plan.

## 2016-02-19 NOTE — Telephone Encounter (Signed)
-----   Message from Belva Crome, MD sent at 02/19/2016  5:42 PM EST ----- Hold furosemide for 48 hours then resume 40 mg per day. Asymptomatic metabolic panel should be done in 7-10 days.

## 2016-02-21 ENCOUNTER — Other Ambulatory Visit: Payer: PPO

## 2016-03-01 ENCOUNTER — Other Ambulatory Visit: Payer: PPO | Admitting: *Deleted

## 2016-03-01 DIAGNOSIS — I10 Essential (primary) hypertension: Secondary | ICD-10-CM

## 2016-03-01 LAB — BASIC METABOLIC PANEL
BUN: 45 mg/dL — AB (ref 7–25)
CO2: 21 mmol/L (ref 20–31)
Calcium: 8.4 mg/dL — ABNORMAL LOW (ref 8.6–10.3)
Chloride: 109 mmol/L (ref 98–110)
Creat: 1.93 mg/dL — ABNORMAL HIGH (ref 0.70–1.18)
Glucose, Bld: 240 mg/dL — ABNORMAL HIGH (ref 65–99)
POTASSIUM: 4.4 mmol/L (ref 3.5–5.3)
SODIUM: 139 mmol/L (ref 135–146)

## 2016-03-05 ENCOUNTER — Telehealth: Payer: Self-pay | Admitting: Gastroenterology

## 2016-03-05 NOTE — Telephone Encounter (Signed)
Pt called wanting to know why he was being seen this week. Reviewed with pt that NP from [facility] had referred him to GI. Pt aware and asked for directions to the office. Pt verbalized understanding.

## 2016-03-06 ENCOUNTER — Other Ambulatory Visit: Payer: Self-pay | Admitting: Family Medicine

## 2016-03-08 ENCOUNTER — Ambulatory Visit: Payer: PPO | Admitting: Gastroenterology

## 2016-03-08 ENCOUNTER — Telehealth: Payer: Self-pay

## 2016-03-08 ENCOUNTER — Ambulatory Visit (INDEPENDENT_AMBULATORY_CARE_PROVIDER_SITE_OTHER): Payer: PPO

## 2016-03-08 DIAGNOSIS — I5022 Chronic systolic (congestive) heart failure: Secondary | ICD-10-CM | POA: Diagnosis not present

## 2016-03-08 DIAGNOSIS — Z95810 Presence of automatic (implantable) cardiac defibrillator: Secondary | ICD-10-CM | POA: Diagnosis not present

## 2016-03-08 DIAGNOSIS — I739 Peripheral vascular disease, unspecified: Secondary | ICD-10-CM

## 2016-03-08 DIAGNOSIS — M79605 Pain in left leg: Secondary | ICD-10-CM

## 2016-03-08 DIAGNOSIS — Z789 Other specified health status: Secondary | ICD-10-CM

## 2016-03-08 NOTE — Telephone Encounter (Signed)
Spoke to pt for appt, aware it is with NP and a fasting appt for 1/17 8:30

## 2016-03-08 NOTE — Progress Notes (Signed)
EPIC Encounter for ICM Monitoring  Patient Name: [name] is a 82 y.o. male Date: 03/08/2016 Primary Care Physican: [name], MD Primary Cardiologist:Smith Electrophysiologist: Allred Dry Weight:Unknown - amputee Bi-V Pacing: 99.8% Time in AT/AF 24.0 hr/day (100.0%)      Heart Failure questions reviewed, pt asymptomatic but does have a cold  Thoracic impedance normal   Recommendations: No changes.  Reinforced to limit low salt food choices to 2000 mg day and limiting fluid intake to < 2 liters per day. Encouraged to call for fluid symptoms.    Follow-up plan: ICM clinic phone appointment on 04/29/2016.  Office appt with Chanetta Marshall, NP 03/29/2016.   Copy of ICM check sent to device physician.   3 month ICM trend : 03/08/2016   1 Year ICM trend:      Rosalene Billings, RN 03/08/2016 4:19 PM

## 2016-03-08 NOTE — Telephone Encounter (Signed)
rec'd phone call from pt.  C/o approx. 2 month hx. of his left AKA stump feeling "like there is a strap around the leg."  Stated the tightness is noticeable when he attempts to lift up the leg.  Reported the tight sensation starts approx. 2 inches from the bottom of the stump.  Reported when he applies his prosthesis, the stump feels sore.  Denies any redness, open sores, or drainage from left AKA stump.  Denies swelling of the stump.  Requested appt. to have this checked.  Discussed pt. with Dr. Donzetta Matters.  Recommended to schedule Aorto-iliac duplex and OV.

## 2016-03-11 ENCOUNTER — Ambulatory Visit (INDEPENDENT_AMBULATORY_CARE_PROVIDER_SITE_OTHER): Payer: PPO | Admitting: General Practice

## 2016-03-11 DIAGNOSIS — I482 Chronic atrial fibrillation, unspecified: Secondary | ICD-10-CM

## 2016-03-11 DIAGNOSIS — Z23 Encounter for immunization: Secondary | ICD-10-CM

## 2016-03-11 LAB — POCT INR: INR: 2.6

## 2016-03-11 NOTE — Patient Instructions (Signed)
Pre visit review using our clinic review tool, if applicable. No additional management support is needed unless otherwise documented below in the visit note. 

## 2016-03-15 ENCOUNTER — Encounter: Payer: Self-pay | Admitting: Vascular Surgery

## 2016-03-20 ENCOUNTER — Inpatient Hospital Stay (HOSPITAL_COMMUNITY): Admission: RE | Admit: 2016-03-20 | Payer: PPO | Source: Ambulatory Visit

## 2016-03-20 ENCOUNTER — Ambulatory Visit: Payer: PPO | Admitting: Family

## 2016-03-22 ENCOUNTER — Ambulatory Visit (INDEPENDENT_AMBULATORY_CARE_PROVIDER_SITE_OTHER)
Admission: RE | Admit: 2016-03-22 | Discharge: 2016-03-22 | Disposition: A | Discharge status: Home / Self Care | Payer: PPO | Source: Ambulatory Visit | Attending: Adult Health | Admitting: Adult Health

## 2016-03-22 ENCOUNTER — Encounter: Payer: Self-pay | Admitting: Adult Health

## 2016-03-22 ENCOUNTER — Other Ambulatory Visit: Payer: Self-pay | Admitting: Adult Health

## 2016-03-22 ENCOUNTER — Ambulatory Visit (INDEPENDENT_AMBULATORY_CARE_PROVIDER_SITE_OTHER): Payer: PPO | Admitting: Adult Health

## 2016-03-22 ENCOUNTER — Telehealth: Payer: Self-pay | Admitting: Adult Health

## 2016-03-22 ENCOUNTER — Telehealth: Payer: Self-pay

## 2016-03-22 VITALS — BP 144/66 | HR 66 | Temp 97.5°F | Ht 68.0 in | Wt 182.0 lb

## 2016-03-22 DIAGNOSIS — R079 Chest pain, unspecified: Secondary | ICD-10-CM | POA: Diagnosis not present

## 2016-03-22 DIAGNOSIS — R05 Cough: Secondary | ICD-10-CM

## 2016-03-22 DIAGNOSIS — R059 Cough, unspecified: Secondary | ICD-10-CM

## 2016-03-22 MED ORDER — METHYLPREDNISOLONE 4 MG PO TBPK: ORAL_TABLET | ORAL | 0 refills | Status: DC

## 2016-03-22 MED ORDER — BENZONATATE 200 MG PO CAPS: 200.0000 mg | ORAL_CAPSULE | Freq: Two times a day (BID) | ORAL | 0 refills | Status: DC | PRN

## 2016-03-22 NOTE — Progress Notes (Signed)
Pre visit review using our clinic review tool, if applicable. No additional management support is needed unless otherwise documented below in the visit note. 

## 2016-03-22 NOTE — Progress Notes (Signed)
Subjective:    Patient ID: [name], male    DOB: 25-Jul-1934, 82 y.o.   MRN: [id number]  Cough  This is a new problem. The current episode started in the past 7 days. The cough is non-productive. Associated symptoms include chills and wheezing. Pertinent negatives include no ear congestion, ear pain, postnasal drip, rhinorrhea or sore throat. The symptoms are aggravated by lying down. Treatments tried: OTC cough suppressant  The treatment provided mild relief. There is no history of asthma, bronchitis, COPD or pneumonia.   He denies any fevers, n/v/d.    Review of Systems  Constitutional: Positive for chills and fatigue. Negative for unexpected weight change.  HENT: Negative.  Negative for ear pain, postnasal drip, rhinorrhea, sinus pain, sinus pressure and sore throat.   Eyes: Negative.   Respiratory: Positive for cough, chest tightness and wheezing.   Cardiovascular: Negative.   Gastrointestinal: Positive for abdominal pain (from coughing ).  Musculoskeletal: Negative.   All other systems reviewed and are negative.  Past Medical History:  Diagnosis Date  . Arthritis   . Atrial fibrillation ([place])    takes Warfarin daily  . Automatic implantable cardioverter-defibrillator in situ   . Benign prostatic hypertrophy    takes Proscar daily  . CAD (coronary artery disease)    a. s/p mid LAD stenting with DES 2008 Dr. Daneen Schick  . CHF (congestive heart failure) ([place])    takes Lasix daily  . Chronic systolic dysfunction of left ventricle    a. mixed ischemic and nonischemic CM,  EF 35%. b. s/p AICD implantation.  . Constipation    takes Miralax daily as needed and Colace daily   . Depression   . ED (erectile dysfunction)   . Gout   . History of MRSA infection   . Hypertension    takes Carvedilol and Losartan daily  . ICD (implantable cardiac defibrillator) in place    medtronic, Dr. Rayann Heman  . ICD (implantable cardiac defibrillator) in place   . Insomnia    TAKES  TRAZODONE NIGHTLY  . Numbness and tingling    Hx; 68f left foot  . Pacemaker    medtronic  . PAD (peripheral artery disease) (H[facility])    Severe by PV angiogram 09/2011  . PAD (peripheral artery disease) (H[facility])    s/p multiple LLE bypass grafts; left mid-distal SCA occlusion by 08/2012 duplex  . Renal artery stenosis ([place])    s/p stenting 2009  . Sleep apnea    hx of "had surgery for"  . Type II diabetes mellitus ([place])    takes Novolog 70/30    Social History   Social History  . Marital status: Widowed    Spouse name: N/A  . Number of children: N/A  . Years of education: N/A   Occupational History  . Retired Retired   Social History Main Topics  . Smoking status: Current Some Day Smoker    Types: Cigars  . Smokeless tobacco: Never Used     Comment: 1 cigar each month  . Alcohol use 0.0 oz/week     Comment: once a week wine  . Drug use: No  . Sexual activity: Not Currently   Other Topics Concern  . Not on file   Social History Narrative   Lives in [place] with significant other,  [name].    Past Surgical History:  Procedure Laterality Date  . ABDOMINAL ANGIOGRAM  12/25/2011   Procedure: ABDOMINAL ANGIOGRAM;  Surgeon: Serafina Mitchell, MD;  Location: [facility]  CATH LAB;  Service: Cardiovascular;;  . ABDOMINAL AORTAGRAM N/A 09/11/2011   Procedure: ABDOMINAL Maxcine Ham;  Surgeon: Wellington Hampshire, MD;  Location: [place] CATH LAB;  Service: Cardiovascular;  Laterality: N/A;  . ADENOIDECTOMY     Hx; of   . AMPUTATION Left 02/04/2013   Procedure: AMPUTATION ABOVE KNEE-LEFT;  Surgeon: Mal Misty, MD;  Location: [place];  Service: Vascular;  Laterality: Left;  . BICEPS TENDON REPAIR Right 2001   Archie Endo 03/20/2001 (07/10/2012)  . BIV ICD GENERTAOR CHANGE OUT N/A 11/09/2013   Procedure: BIV ICD GENERTAOR CHANGE OUT;  Surgeon: Coralyn Mark, MD;  Location: [facility] CATH LAB;  Service: Cardiovascular;  Laterality: N/A;  . CARDIAC DEFIBRILLATOR PLACEMENT  12/26/09   pacemaker combo  . CARPAL  TUNNEL RELEASE Right 2002   Archie Endo 03/20/2001 (07/10/2012)  . cervical epidural injection  2013  . COLONOSCOPY     Hx; of  . CORONARY ANGIOPLASTY    . CORONARY ANGIOPLASTY WITH STENT PLACEMENT  ~ 2000  . EMBOLECTOMY  02/07/2012   Procedure: EMBOLECTOMY;  Surgeon: Rosetta Posner, MD;  Location: [place];  Service: Vascular;  Laterality: Left;  . EMBOLECTOMY Left 12/16/2012   Procedure: THROMBECTOMY  LEFT LEG BYPASS;  Surgeon: Elam Dutch, MD;  Location: [place];  Service: Vascular;  Laterality: Left;  . FEMORAL-POPLITEAL BYPASS GRAFT Left 09/16/2012   Procedure: LEFT FEMORAL-POPLITEAL BYPASS GRAFT WITH GORTEX Propaten Graft 6x80 Thin Wall and Left lower leg Angiogram;  Surgeon: Mal Misty, MD;  Location: [place];  Service: Vascular;  Laterality: Left;  . FEMORAL-TIBIAL BYPASS GRAFT  09/25/2011   Procedure: BYPASS GRAFT FEMORAL-TIBIAL ARTERY;  Surgeon: Mal Misty, MD;  Location: [facility] OR;  Service: Vascular;  Laterality: Left;  Left Femoral - Anterior Tibial Bypass;  saphenous vein graft from left leg  . FEMORAL-TIBIAL BYPASS GRAFT  02/07/2012   Procedure: BYPASS GRAFT FEMORAL-TIBIAL ARTERY;  Surgeon: Rosetta Posner, MD;  Location: [place];  Service: Vascular;  Laterality: Left;  Thrombectomy Left Femoral - Tibial Bypass Graft  . FEMORAL-TIBIAL BYPASS GRAFT  04/03/2012   Procedure: BYPASS GRAFT FEMORAL-TIBIAL ARTERY;  Surgeon: Mal Misty, MD;  Location: [place];  Service: Vascular;  Laterality: Left;  Redo  . FEMORAL-TIBIAL BYPASS GRAFT Left 09/30/2012   Procedure: REDO LEFT FEMORAL-ANTERIOR TIBIAL ARTERY BYPASS USING COMPOSITE CEPHALIC AND BASILIC VEIN GRAFT FROM LEFT ARM;  Surgeon: Mal Misty, MD;  Location: [place];  Service: Vascular;  Laterality: Left;  . FOOT SURGERY Right 03/20/2001   "have plates and screws in; didn't break it" (07/10/2012)  . GROIN DEBRIDEMENT Left 11/12/2012   Procedure: CLOSURE INGUINAL WOUND;  Surgeon: Mal Misty, MD;  Location: [place];  Service: Vascular;  Laterality: Left;   . I&D EXTREMITY Left 10/21/2012   Procedure: EXPLORATION AND DEBRIDEMENT OF LEFT GROIN WOUND;  Surgeon: Mal Misty, MD;  Location: [place];  Service: Vascular;  Laterality: Left;  . INSERT / REPLACE / REMOVE PACEMAKER  2007  . INTRAOPERATIVE ARTERIOGRAM  09/25/2011   Procedure: INTRA OPERATIVE ARTERIOGRAM;  Surgeon: Mal Misty, MD;  Location: [place];  Service: Vascular;  Laterality: Left;  . LEG AMPUTATION ABOVE KNEE Left 02/04/2013   DR LAWSON  . LOWER EXTREMITY ANGIOGRAM Left 12/25/2011   Procedure: LOWER EXTREMITY ANGIOGRAM;  Surgeon: Serafina Mitchell, MD;  Location: [facility] CATH LAB;  Service: Cardiovascular;  Laterality: Left;  lt leg angio co2  . LOWER EXTREMITY ANGIOGRAM N/A 07/07/2012   Procedure: LOWER EXTREMITY ANGIOGRAM;  Surgeon: Conrad Greeley, MD;  Location: [facility] CATH LAB;  Service: Cardiovascular;  Laterality: N/A;  . LOWER EXTREMITY ANGIOGRAM N/A 09/15/2012   Procedure: LOWER EXTREMITY ANGIOGRAM;  Surgeon: Serafina Mitchell, MD;  Location: [facility] CATH LAB;  Service: Cardiovascular;  Laterality: N/A;  . PATCH ANGIOPLASTY Left 10/21/2012   Procedure: PATCH ANGIOPLASTY;  Surgeon: Mal Misty, MD;  Location: [place];  Service: Vascular;  Laterality: Left;  . PERIPHERAL VASCULAR CATHETERIZATION N/A 11/08/2015   Procedure: Abdominal Aortogram w/Lower Extremity;  Surgeon: Waynetta Sandy, MD;  Location: [place] CV LAB;  Service: Cardiovascular;  Laterality: N/A;  . REMOVAL OF GRAFT Left 04/16/2013   Procedure: I & D LEFT AKA WOUND, POSSIBLE REMOVAL OF INFECTED GORTEX GRAFT;  Surgeon: Mal Misty, MD;  Location: [place];  Service: Vascular;  Laterality: Left;  . RENAL ARTERY STENT  2009  . SHOULDER OPEN ROTATOR CUFF REPAIR Right 2001   repair of lacerated right/notes 10/11/1999  (07/10/2012)  . TONSILLECTOMY    . TURP VAPORIZATION    . UPPER EXTREMITY ANGIOGRAM  09/15/2012   Procedure: UPPER EXTREMITY ANGIOGRAM;  Surgeon: Serafina Mitchell, MD;  Location: [facility] CATH LAB;  Service:  Cardiovascular;;  . UVULOPALATOPHARYNGOPLASTY (UPPP)/TONSILLECTOMY/SEPTOPLASTY  06/30/2003   Archie Endo 06/30/2003 (07/10/2012)    Family History  Problem Relation Age of Onset  . Cancer Father   . Cancer      breast/fhx  . Heart disease      fhx  . Diabetes Neg Hx     Allergies  Allergen Reactions  . Other Other (See Comments)    Plastic tape tears skin  . Bactrim Ds [Sulfamethoxazole-Trimethoprim] Other (See Comments)    Hand tremors    Current Outpatient Prescriptions on File Prior to Visit  Medication Sig Dispense Refill  . allopurinol (ZYLOPRIM) 100 MG tablet Take 1 tablet (100 mg total) by mouth daily as needed (Gout). 90 tablet 3  . amLODipine (NORVASC) 5 MG tablet Take 1 tablet (5 mg total) by mouth daily. 90 tablet 3  . aspirin EC 81 MG tablet Take 81 mg by mouth daily.    . carvedilol (COREG) 25 MG tablet Take 1 tablet (25 mg total) by mouth 2 (two) times daily with a meal. 180 tablet 3  . DULoxetine (CYMBALTA) 20 MG capsule Take 1 capsule (20 mg total) by mouth daily. 30 capsule 2  . finasteride (PROSCAR) 5 MG tablet Take 1 tablet (5 mg total) by mouth daily. 90 tablet 3  . furosemide (LASIX) 40 MG tablet Take 1 tablet (40 mg total) by mouth daily. 90 tablet 3  . gabapentin (NEURONTIN) 300 MG capsule Take 2 capsules (600 mg total) by mouth 3 (three) times daily. 540 capsule 3  . insulin aspart protamine- aspart (NOVOLOG MIX 70/30) (70-30) 100 UNIT/ML injection Inject 7 Units into the skin 2 (two) times daily with a meal.    . Insulin Pen Needle 31G X 8 MM MISC Use 2-3 times per day and diagnosis code is 250.00 100 each 1  . losartan (COZAAR) 100 MG tablet Take 1 tablet (100 mg total) by mouth daily. 90 tablet 3  . nitroGLYCERIN (NITROSTAT) 0.4 MG SL tablet Place 1 tablet (0.4 mg total) under the tongue every 5 (five) minutes as needed for chest pain. For chest pain, max 3 doses 90 tablet 3  . ONE TOUCH ULTRA TEST test strip USE TO TEST BLOOD SUGAR TWO TIMES DAILY AS INSTRUCTED  BY PHYSICIAN. 100 each 4  . oxyCODONE-acetaminophen (  PERCOCET) 10-325 MG tablet Take 1 tablet by mouth every 6 (six) hours as needed for pain. 90 tablet 0  . warfarin (COUMADIN) 5 MG tablet TAKE AS DIRECTED BY ANTICOAGULATION CLINIC (Patient taking differently: Take 2.5 mg by mouth daily. ) 60 tablet 1  . sildenafil (VIAGRA) 100 MG tablet Take as directed (Patient not taking: Reported on 03/22/2016) 5 tablet 0   No current facility-administered medications on file prior to visit.     BP (!) 144/66 (BP Location: Right Arm, Patient Position: Sitting, Cuff Size: Large)   Pulse 66   Temp 97.5 F (36.4 C) (Oral)   Ht 5\' 8"  (1.727 m)   Wt 182 lb (82.6 kg)   SpO2 98%   BMI 27.67 kg/m       Objective:   Physical Exam  Constitutional: He is oriented to person, place, and time. He appears well-developed and well-nourished. No distress.  HENT:  Head: Normocephalic and atraumatic.  Right Ear: External ear normal.  Left Ear: External ear normal.  Nose: Nose normal.  Mouth/Throat: Oropharynx is clear and moist.  Neck: Normal range of motion. Neck supple. No thyromegaly present.  Cardiovascular: Normal rate, regular rhythm, normal heart sounds and intact distal pulses.  Exam reveals no gallop.   No murmur heard. Pulmonary/Chest: Effort normal and breath sounds normal. No respiratory distress. He has no wheezes. He has no rales. He exhibits no tenderness.  Abdominal: Soft. Bowel sounds are normal. He exhibits no distension. There is tenderness (slight tenderness with palpation ).  Lymphadenopathy:    He has no cervical adenopathy.  Neurological: He is alert and oriented to person, place, and time.  Skin: Skin is warm and dry. No rash noted. He is not diaphoretic. No erythema. No pallor.  Psychiatric: He has a normal mood and affect. His behavior is normal. Judgment and thought content normal.  Nursing note and vitals reviewed.     Assessment & Plan:  1. Cough - DG Chest 2 View; Future -  Will treat appropriately once chest x ray result shave returned - Follow up precautions given   Dorothyann Peng, NP

## 2016-03-22 NOTE — Telephone Encounter (Signed)
Spoke to patient and informed of chest x ray . Will call in Tessalon pearls and prednisone dose pack. Advised to drink plenty of water while taking prednisone and to monitor blood sugars daily

## 2016-03-22 NOTE — Telephone Encounter (Signed)
Spoke with pt and he is still very sick. He c/o productive cough, chest tightness and ShOB. Pt scheduled to see Dorothyann Peng, NP @ 1:15pm today. Pt agrees. Nothing further needed at this time.     Patient Name: [name] Gender: Male DOB: 08/04/1934 Age: 82 Y 4 D Return Phone Number: [phone] (Primary), [phone] (Secondary) Address[place] [address] Client [facility] Night - Client Client Site [facility] - Night Physician [name] - MD Contact Type Call Who Is Calling Patient / Member / Family / Caregiver Call Type Triage / Clinical Relationship To Patient Self Return Phone Number [phone] (Secondary) Chief Complaint CHEST PAIN (>=21 years) - pain, pressure, heaviness or tightness Reason for Call Symptomatic / Request for [place] states has a bad cold and chest hurting

## 2016-03-25 ENCOUNTER — Ambulatory Visit (INDEPENDENT_AMBULATORY_CARE_PROVIDER_SITE_OTHER): Payer: PPO | Admitting: Family Medicine

## 2016-03-25 ENCOUNTER — Telehealth: Payer: Self-pay | Admitting: Adult Health

## 2016-03-25 ENCOUNTER — Encounter: Payer: Self-pay | Admitting: Family Medicine

## 2016-03-25 VITALS — BP 130/70 | HR 98 | Temp 97.6°F | Ht 68.0 in

## 2016-03-25 DIAGNOSIS — J019 Acute sinusitis, unspecified: Secondary | ICD-10-CM

## 2016-03-25 MED ORDER — AMOXICILLIN-POT CLAVULANATE 500-125 MG PO TABS
1.0000 | ORAL_TABLET | Freq: Three times a day (TID) | ORAL | 0 refills | Status: DC
Start: 2016-03-25 — End: 2016-04-11

## 2016-03-25 MED ORDER — AMOXICILLIN-POT CLAVULANATE 875-125 MG PO TABS
1.0000 | ORAL_TABLET | Freq: Two times a day (BID) | ORAL | 0 refills | Status: DC
Start: 2016-03-25 — End: 2016-03-25

## 2016-03-25 NOTE — Progress Notes (Signed)
HPI:  URI: -started: 2 weeks ago -symptoms:nasal congestion, PND, cough - not improving with prednisone and throat feels worse today -denies:fever, SOB, NVD, tooth pain, body aches, DOE, orthopnea, swelling -has tried: tessalon, 3 days prednisone from PCP  -sick contacts/travel/risks: no reported flu, strep or tick exposure  ROS: See pertinent positives and negatives per HPI.  Past Medical History:  Diagnosis Date  . Arthritis   . Atrial fibrillation ([place])    takes Warfarin daily  . Automatic implantable cardioverter-defibrillator in situ   . Benign prostatic hypertrophy    takes Proscar daily  . CAD (coronary artery disease)    a. s/p mid LAD stenting with DES 2008 Dr. Daneen Schick  . CHF (congestive heart failure) ([place])    takes Lasix daily  . Chronic systolic dysfunction of left ventricle    a. mixed ischemic and nonischemic CM,  EF 35%. b. s/p AICD implantation.  . Constipation    takes Miralax daily as needed and Colace daily   . Depression   . ED (erectile dysfunction)   . Gout   . History of MRSA infection   . Hypertension    takes Carvedilol and Losartan daily  . ICD (implantable cardiac defibrillator) in place    medtronic, Dr. Rayann Heman  . ICD (implantable cardiac defibrillator) in place   . Insomnia    TAKES TRAZODONE NIGHTLY  . Numbness and tingling    Hx; 23f left foot  . Pacemaker    medtronic  . PAD (peripheral artery disease) (HCC)    Severe by PV angiogram 09/2011  . PAD (peripheral artery disease) (HCC)    s/p multiple LLE bypass grafts; left mid-distal SCA occlusion by 08/2012 duplex  . Renal artery stenosis ([place])    s/p stenting 2009  . Sleep apnea    hx of "had surgery for"  . Type II diabetes mellitus (HCC)    takes Novolog 70/30    Past Surgical History:  Procedure Laterality Date  . ABDOMINAL ANGIOGRAM  12/25/2011   Procedure: ABDOMINAL ANGIOGRAM;  Surgeon: Serafina Mitchell, MD;  Location: [facility] CATH LAB;  Service: Cardiovascular;;  .  ABDOMINAL AORTAGRAM N/A 09/11/2011   Procedure: ABDOMINAL Maxcine Ham;  Surgeon: Wellington Hampshire, MD;  Location: [place] CATH LAB;  Service: Cardiovascular;  Laterality: N/A;  . ADENOIDECTOMY     Hx; of   . AMPUTATION Left 02/04/2013   Procedure: AMPUTATION ABOVE KNEE-LEFT;  Surgeon: Mal Misty, MD;  Location: [place];  Service: Vascular;  Laterality: Left;  . BICEPS TENDON REPAIR Right 2001   Archie Endo 03/20/2001 (07/10/2012)  . BIV ICD GENERTAOR CHANGE OUT N/A 11/09/2013   Procedure: BIV ICD GENERTAOR CHANGE OUT;  Surgeon: Coralyn Mark, MD;  Location: [facility] CATH LAB;  Service: Cardiovascular;  Laterality: N/A;  . CARDIAC DEFIBRILLATOR PLACEMENT  12/26/09   pacemaker combo  . CARPAL TUNNEL RELEASE Right 2002   Archie Endo 03/20/2001 (07/10/2012)  . cervical epidural injection  2013  . COLONOSCOPY     Hx; of  . CORONARY ANGIOPLASTY    . CORONARY ANGIOPLASTY WITH STENT PLACEMENT  ~ 2000  . EMBOLECTOMY  02/07/2012   Procedure: EMBOLECTOMY;  Surgeon: Rosetta Posner, MD;  Location: [place];  Service: Vascular;  Laterality: Left;  . EMBOLECTOMY Left 12/16/2012   Procedure: THROMBECTOMY  LEFT LEG BYPASS;  Surgeon: Elam Dutch, MD;  Location: [place];  Service: Vascular;  Laterality: Left;  . FEMORAL-POPLITEAL BYPASS GRAFT Left 09/16/2012   Procedure: LEFT FEMORAL-POPLITEAL BYPASS GRAFT WITH  GORTEX Propaten Graft 6x80 Thin Wall and Left lower leg Angiogram;  Surgeon: Mal Misty, MD;  Location: [place];  Service: Vascular;  Laterality: Left;  . FEMORAL-TIBIAL BYPASS GRAFT  09/25/2011   Procedure: BYPASS GRAFT FEMORAL-TIBIAL ARTERY;  Surgeon: Mal Misty, MD;  Location: [facility] OR;  Service: Vascular;  Laterality: Left;  Left Femoral - Anterior Tibial Bypass;  saphenous vein graft from left leg  . FEMORAL-TIBIAL BYPASS GRAFT  02/07/2012   Procedure: BYPASS GRAFT FEMORAL-TIBIAL ARTERY;  Surgeon: Rosetta Posner, MD;  Location: [place];  Service: Vascular;  Laterality: Left;  Thrombectomy Left Femoral - Tibial Bypass Graft  .  FEMORAL-TIBIAL BYPASS GRAFT  04/03/2012   Procedure: BYPASS GRAFT FEMORAL-TIBIAL ARTERY;  Surgeon: Mal Misty, MD;  Location: [place];  Service: Vascular;  Laterality: Left;  Redo  . FEMORAL-TIBIAL BYPASS GRAFT Left 09/30/2012   Procedure: REDO LEFT FEMORAL-ANTERIOR TIBIAL ARTERY BYPASS USING COMPOSITE CEPHALIC AND BASILIC VEIN GRAFT FROM LEFT ARM;  Surgeon: Mal Misty, MD;  Location: [place];  Service: Vascular;  Laterality: Left;  . FOOT SURGERY Right 03/20/2001   "have plates and screws in; didn't break it" (07/10/2012)  . GROIN DEBRIDEMENT Left 11/12/2012   Procedure: CLOSURE INGUINAL WOUND;  Surgeon: Mal Misty, MD;  Location: [place];  Service: Vascular;  Laterality: Left;  . I&D EXTREMITY Left 10/21/2012   Procedure: EXPLORATION AND DEBRIDEMENT OF LEFT GROIN WOUND;  Surgeon: Mal Misty, MD;  Location: [place];  Service: Vascular;  Laterality: Left;  . INSERT / REPLACE / REMOVE PACEMAKER  2007  . INTRAOPERATIVE ARTERIOGRAM  09/25/2011   Procedure: INTRA OPERATIVE ARTERIOGRAM;  Surgeon: Mal Misty, MD;  Location: [place];  Service: Vascular;  Laterality: Left;  . LEG AMPUTATION ABOVE KNEE Left 02/04/2013   DR LAWSON  . LOWER EXTREMITY ANGIOGRAM Left 12/25/2011   Procedure: LOWER EXTREMITY ANGIOGRAM;  Surgeon: Serafina Mitchell, MD;  Location: [facility] CATH LAB;  Service: Cardiovascular;  Laterality: Left;  lt leg angio co2  . LOWER EXTREMITY ANGIOGRAM N/A 07/07/2012   Procedure: LOWER EXTREMITY ANGIOGRAM;  Surgeon: Conrad Latham, MD;  Location: [facility] CATH LAB;  Service: Cardiovascular;  Laterality: N/A;  . LOWER EXTREMITY ANGIOGRAM N/A 09/15/2012   Procedure: LOWER EXTREMITY ANGIOGRAM;  Surgeon: Serafina Mitchell, MD;  Location: [facility] CATH LAB;  Service: Cardiovascular;  Laterality: N/A;  . PATCH ANGIOPLASTY Left 10/21/2012   Procedure: PATCH ANGIOPLASTY;  Surgeon: Mal Misty, MD;  Location: [place];  Service: Vascular;  Laterality: Left;  . PERIPHERAL VASCULAR CATHETERIZATION N/A 11/08/2015   Procedure:  Abdominal Aortogram w/Lower Extremity;  Surgeon: Waynetta Sandy, MD;  Location: [place] CV LAB;  Service: Cardiovascular;  Laterality: N/A;  . REMOVAL OF GRAFT Left 04/16/2013   Procedure: I & D LEFT AKA WOUND, POSSIBLE REMOVAL OF INFECTED GORTEX GRAFT;  Surgeon: Mal Misty, MD;  Location: [place];  Service: Vascular;  Laterality: Left;  . RENAL ARTERY STENT  2009  . SHOULDER OPEN ROTATOR CUFF REPAIR Right 2001   repair of lacerated right/notes 10/11/1999  (07/10/2012)  . TONSILLECTOMY    . TURP VAPORIZATION    . UPPER EXTREMITY ANGIOGRAM  09/15/2012   Procedure: UPPER EXTREMITY ANGIOGRAM;  Surgeon: Serafina Mitchell, MD;  Location: [facility] CATH LAB;  Service: Cardiovascular;;  . UVULOPALATOPHARYNGOPLASTY (UPPP)/TONSILLECTOMY/SEPTOPLASTY  06/30/2003   Archie Endo 06/30/2003 (07/10/2012)    Family History  Problem Relation Age of Onset  . Cancer Father   . Cancer  breast/fhx  . Heart disease      fhx  . Diabetes Neg Hx     Social History   Social History  . Marital status: Widowed    Spouse name: N/A  . Number of children: N/A  . Years of education: N/A   Occupational History  . Retired Retired   Social History Main Topics  . Smoking status: Current Some Day Smoker    Types: Cigars  . Smokeless tobacco: Never Used     Comment: 1 cigar each month  . Alcohol use 0.0 oz/week     Comment: once a week wine  . Drug use: No  . Sexual activity: Not Currently   Other Topics Concern  . None   Social History Narrative   Lives in [place] with significant other,  [name].     Current Outpatient Prescriptions:  .  allopurinol (ZYLOPRIM) 100 MG tablet, Take 1 tablet (100 mg total) by mouth daily as needed (Gout)., Disp: 90 tablet, Rfl: 3 .  amLODipine (NORVASC) 5 MG tablet, Take 1 tablet (5 mg total) by mouth daily., Disp: 90 tablet, Rfl: 3 .  aspirin EC 81 MG tablet, Take 81 mg by mouth daily., Disp: , Rfl:  .  benzonatate (TESSALON) 200 MG capsule, Take 1 capsule (200 mg  total) by mouth 2 (two) times daily as needed for cough., Disp: 20 capsule, Rfl: 0 .  carvedilol (COREG) 25 MG tablet, Take 1 tablet (25 mg total) by mouth 2 (two) times daily with a meal., Disp: 180 tablet, Rfl: 3 .  DULoxetine (CYMBALTA) 20 MG capsule, Take 1 capsule (20 mg total) by mouth daily., Disp: 30 capsule, Rfl: 2 .  finasteride (PROSCAR) 5 MG tablet, Take 1 tablet (5 mg total) by mouth daily., Disp: 90 tablet, Rfl: 3 .  furosemide (LASIX) 40 MG tablet, Take 1 tablet (40 mg total) by mouth daily., Disp: 90 tablet, Rfl: 3 .  gabapentin (NEURONTIN) 300 MG capsule, Take 2 capsules (600 mg total) by mouth 3 (three) times daily., Disp: 540 capsule, Rfl: 3 .  insulin aspart protamine- aspart (NOVOLOG MIX 70/30) (70-30) 100 UNIT/ML injection, Inject 7 Units into the skin 2 (two) times daily with a meal., Disp: , Rfl:  .  Insulin Pen Needle 31G X 8 MM MISC, Use 2-3 times per day and diagnosis code is 250.00, Disp: 100 each, Rfl: 1 .  losartan (COZAAR) 100 MG tablet, Take 1 tablet (100 mg total) by mouth daily., Disp: 90 tablet, Rfl: 3 .  methylPREDNISolone (MEDROL DOSEPAK) 4 MG TBPK tablet, Take as directed, Disp: 21 tablet, Rfl: 0 .  nitroGLYCERIN (NITROSTAT) 0.4 MG SL tablet, Place 1 tablet (0.4 mg total) under the tongue every 5 (five) minutes as needed for chest pain. For chest pain, max 3 doses, Disp: 90 tablet, Rfl: 3 .  ONE TOUCH ULTRA TEST test strip, USE TO TEST BLOOD SUGAR TWO TIMES DAILY AS INSTRUCTED BY PHYSICIAN., Disp: 100 each, Rfl: 4 .  oxyCODONE-acetaminophen (PERCOCET) 10-325 MG tablet, Take 1 tablet by mouth every 6 (six) hours as needed for pain., Disp: 90 tablet, Rfl: 0 .  sildenafil (VIAGRA) 100 MG tablet, Take as directed, Disp: 5 tablet, Rfl: 0 .  warfarin (COUMADIN) 5 MG tablet, TAKE AS DIRECTED BY ANTICOAGULATION CLINIC (Patient taking differently: Take 2.5 mg by mouth daily. ), Disp: 60 tablet, Rfl: 1 .  amoxicillin-clavulanate (AUGMENTIN) 500-125 MG tablet, Take 1 tablet  (500 mg total) by mouth 3 (three) times daily., Disp: 20 tablet, Rfl:  0  EXAM:  Vitals:   03/25/16 1624  BP: 130/70  Pulse: 98  Temp: 97.6 F (36.4 C)    There is no height or weight on file to calculate BMI.  GENERAL: vitals reviewed and listed above, alert, oriented, appears well hydrated and in no acute distress  HEENT: atraumatic, conjunttiva clear, no obvious abnormalities on inspection of external nose and ears, normal appearance of ear canals and TMs except for bilateral effusions, mildly discolored on the R, thick  nasal congestion primarily on the R, mild post oropharyngeal erythema with thick discolored PND, no tonsillar edema or exudate, no sinus TTP  NECK: no obvious masses on inspection, no JVD  LUNGS: clear to auscultation bilaterally, no wheezes, rales or rhonchi, good air movement  CV: HRRR, L AKA with prosthesis, tr edema R  MS: moves all extremities without noticeable abnormality  PSYCH: pleasant and cooperative, no obvious depression or anxiety  ASSESSMENT AND PLAN:  Discussed the following assessment and plan:  Acute non-recurrent sinusitis, unspecified location  We discussed potential etiologies, with findings today suggesting sinusitis. His lungs sound clear. He had a negative CXR. We discussed treatment side effects, likely course, antibiotic misuse, transmission, and signs of developing a serious illness. He is followed closely by his cardiologist for his heart conditions and actually feels his CHF has been better recently. Will renally dose the Augmentin given recently labs.  Follow up with PCP given ongoing symptoms for several weeks and of course, we advised to return or notify a doctor immediately if symptoms worsen or persist or new concerns arise.    Patient Instructions  BEFORE YOU LEAVE: -follow up: with Cory in 7-9 days  Take the antibiotic as instructed.  Complete the prednisone.  Tessalon as needed for cough.  Follow up sooner if  worsening or new concerns.    Colin Benton R., DO

## 2016-03-25 NOTE — Telephone Encounter (Signed)
Noted  

## 2016-03-25 NOTE — Telephone Encounter (Signed)
[facility] - Client [facility] Patient Name: [name] DOB: 12-28-1934 Initial Comment caller states he has a cough and abd pain Nurse Assessment Nurse: Markus Daft, RN, Sherre Poot Date/Time (Eastern Time): 03/25/2016 1:13:26 PM Confirm and document reason for call. If symptomatic, describe symptoms. ---Caller states he has a dry cough and abdominal pain only with coughing last week and now. Seen by MD and prescribed Benzonatate and on 3rd day of prednisone now. Very little nasal discharge. No fever. Does the patient have any new or worsening symptoms? ---Yes Will a triage be completed? ---Yes Related visit to physician within the last 2 weeks? ---Yes Does the PT have any chronic conditions? (i.e. diabetes, asthma, etc.) ---Yes List chronic conditions. ---DM Is this a behavioral health or substance abuse call? ---No Guidelines Guideline Title Affirmed Question Affirmed Notes Cough - Acute Non-Productive Wheezing is present Final Disposition User See Physician within 4 Hours (or PCP triage) Markus Daft, RN, Sherre Poot Comments Attempted to make an appt for pt. He needs a refill on his inhaler to use for the wheezing. See pt still? Confirmed with [name], appt at 4:30 is ok with Dr. Maudie Mercury Referrals REFERRED TO PCP OFFICE Disagree/Comply: Comply

## 2016-03-25 NOTE — Patient Instructions (Addendum)
BEFORE YOU LEAVE: -follow up: with Cory in 7-9 days  Take the antibiotic as instructed.  Complete the prednisone.  Tessalon as needed for cough.  Follow up sooner if worsening or new concerns.

## 2016-03-25 NOTE — Progress Notes (Signed)
Pre visit review using our clinic review tool, if applicable. No additional management support is needed unless otherwise documented below in the visit note. 

## 2016-03-29 ENCOUNTER — Encounter: Payer: PPO | Admitting: Nurse Practitioner

## 2016-04-01 ENCOUNTER — Encounter: Payer: Self-pay | Admitting: Physical Medicine & Rehabilitation

## 2016-04-01 ENCOUNTER — Telehealth: Payer: Self-pay | Admitting: *Deleted

## 2016-04-01 ENCOUNTER — Encounter: Payer: Self-pay | Admitting: Family

## 2016-04-01 ENCOUNTER — Encounter: Payer: PPO | Attending: Physical Medicine & Rehabilitation | Admitting: Physical Medicine & Rehabilitation

## 2016-04-01 VITALS — BP 115/54 | HR 62 | Resp 14

## 2016-04-01 DIAGNOSIS — Z89619 Acquired absence of unspecified leg above knee: Secondary | ICD-10-CM | POA: Diagnosis not present

## 2016-04-01 DIAGNOSIS — G8929 Other chronic pain: Secondary | ICD-10-CM | POA: Insufficient documentation

## 2016-04-01 DIAGNOSIS — F17290 Nicotine dependence, other tobacco product, uncomplicated: Secondary | ICD-10-CM | POA: Diagnosis not present

## 2016-04-01 DIAGNOSIS — K529 Noninfective gastroenteritis and colitis, unspecified: Secondary | ICD-10-CM | POA: Insufficient documentation

## 2016-04-01 DIAGNOSIS — T8734 Neuroma of amputation stump, left lower extremity: Secondary | ICD-10-CM | POA: Insufficient documentation

## 2016-04-01 DIAGNOSIS — G546 Phantom limb syndrome with pain: Secondary | ICD-10-CM | POA: Diagnosis not present

## 2016-04-01 DIAGNOSIS — S78112A Complete traumatic amputation at level between left hip and knee, initial encounter: Secondary | ICD-10-CM

## 2016-04-01 DIAGNOSIS — S78119A Complete traumatic amputation at level between unspecified hip and knee, initial encounter: Secondary | ICD-10-CM

## 2016-04-01 DIAGNOSIS — Z89612 Acquired absence of left leg above knee: Secondary | ICD-10-CM

## 2016-04-01 MED ORDER — OXYCODONE-ACETAMINOPHEN 10-325 MG PO TABS: 1.0000 | ORAL_TABLET | Freq: Four times a day (QID) | ORAL | 0 refills | Status: DC | PRN

## 2016-04-01 MED ORDER — DULOXETINE HCL 30 MG PO CPEP: 30.0000 mg | ORAL_CAPSULE | Freq: Every day | ORAL | 2 refills | Status: DC

## 2016-04-01 NOTE — Telephone Encounter (Signed)
On 04/01/2016 NCCSR was reviewed. A conflict was seen on the [place] with multiple prescribers. Patient did receive another script from another provider, [name], MD 10/30/2015 oxycodone acetaminophen 10-325 mg. [name] has signed a Narcotic Contract with our office. If there are any discrepancies this will be reported to Dr. Alger Simons.

## 2016-04-01 NOTE — Progress Notes (Signed)
Subjective:    Patient ID: [name], male    DOB: 01-06-1935, 82 y.o.   MRN: [id number]  HPI   [name] is here in follow up of his chronic pain. He states he's just getting over a 3 week bout with a respiratory illness. He continues to have severe stump and phantom pain. It limits his weight bearing in the prosthesis.   He continues on percocet as prescribed. He's not doing much walking. His mood has been fair. He continues on cymbalta 20mg  daily  Pain Inventory Average Pain 8 Pain Right Now 7 My pain is sharp and stabbing  In the last 24 hours, has pain interfered with the following? General activity 7 Relation with others 8 Enjoyment of life 8 What TIME of day is your pain at its worst? evening Sleep (in general) Poor  Pain is worse with: unsure Pain improves with: n/a Relief from Meds: 8  Mobility use a cane use a walker how many minutes can you walk? 5 ability to climb steps?  yes do you drive?  yes transfers alone Do you have any goals in this area?  yes  Function Do you have any goals in this area?  no  Neuro/Psych No problems in this area  Prior Studies Any changes since last visit?  no  Physicians involved in your care Any changes since last visit?  no   Family History  Problem Relation Age of Onset  . Cancer Father   . Cancer      breast/fhx  . Heart disease      fhx  . Diabetes Neg Hx    Social History   Social History  . Marital status: Widowed    Spouse name: N/A  . Number of children: N/A  . Years of education: N/A   Occupational History  . Retired Retired   Social History Main Topics  . Smoking status: Current Some Day Smoker    Types: Cigars  . Smokeless tobacco: Never Used     Comment: 1 cigar each month  . Alcohol use 0.0 oz/week     Comment: once a week wine  . Drug use: No  . Sexual activity: Not Currently   Other Topics Concern  . None   Social History Narrative   Lives in [place] with significant other,   [name].   Past Surgical History:  Procedure Laterality Date  . ABDOMINAL ANGIOGRAM  12/25/2011   Procedure: ABDOMINAL ANGIOGRAM;  Surgeon: Serafina Mitchell, MD;  Location: [facility] CATH LAB;  Service: Cardiovascular;;  . ABDOMINAL AORTAGRAM N/A 09/11/2011   Procedure: ABDOMINAL Maxcine Ham;  Surgeon: Wellington Hampshire, MD;  Location: [place] CATH LAB;  Service: Cardiovascular;  Laterality: N/A;  . ADENOIDECTOMY     Hx; of   . AMPUTATION Left 02/04/2013   Procedure: AMPUTATION ABOVE KNEE-LEFT;  Surgeon: Mal Misty, MD;  Location: [place];  Service: Vascular;  Laterality: Left;  . BICEPS TENDON REPAIR Right 2001   Archie Endo 03/20/2001 (07/10/2012)  . BIV ICD GENERTAOR CHANGE OUT N/A 11/09/2013   Procedure: BIV ICD GENERTAOR CHANGE OUT;  Surgeon: Coralyn Mark, MD;  Location: [facility] CATH LAB;  Service: Cardiovascular;  Laterality: N/A;  . CARDIAC DEFIBRILLATOR PLACEMENT  12/26/09   pacemaker combo  . CARPAL TUNNEL RELEASE Right 2002   Archie Endo 03/20/2001 (07/10/2012)  . cervical epidural injection  2013  . COLONOSCOPY     Hx; of  . CORONARY ANGIOPLASTY    . CORONARY ANGIOPLASTY WITH STENT PLACEMENT  ~  2000  . EMBOLECTOMY  02/07/2012   Procedure: EMBOLECTOMY;  Surgeon: Rosetta Posner, MD;  Location: [place];  Service: Vascular;  Laterality: Left;  . EMBOLECTOMY Left 12/16/2012   Procedure: THROMBECTOMY  LEFT LEG BYPASS;  Surgeon: Elam Dutch, MD;  Location: [place];  Service: Vascular;  Laterality: Left;  . FEMORAL-POPLITEAL BYPASS GRAFT Left 09/16/2012   Procedure: LEFT FEMORAL-POPLITEAL BYPASS GRAFT WITH GORTEX Propaten Graft 6x80 Thin Wall and Left lower leg Angiogram;  Surgeon: Mal Misty, MD;  Location: [place];  Service: Vascular;  Laterality: Left;  . FEMORAL-TIBIAL BYPASS GRAFT  09/25/2011   Procedure: BYPASS GRAFT FEMORAL-TIBIAL ARTERY;  Surgeon: Mal Misty, MD;  Location: [facility] OR;  Service: Vascular;  Laterality: Left;  Left Femoral - Anterior Tibial Bypass;  saphenous vein graft from left leg  .  FEMORAL-TIBIAL BYPASS GRAFT  02/07/2012   Procedure: BYPASS GRAFT FEMORAL-TIBIAL ARTERY;  Surgeon: Rosetta Posner, MD;  Location: [place];  Service: Vascular;  Laterality: Left;  Thrombectomy Left Femoral - Tibial Bypass Graft  . FEMORAL-TIBIAL BYPASS GRAFT  04/03/2012   Procedure: BYPASS GRAFT FEMORAL-TIBIAL ARTERY;  Surgeon: Mal Misty, MD;  Location: [place];  Service: Vascular;  Laterality: Left;  Redo  . FEMORAL-TIBIAL BYPASS GRAFT Left 09/30/2012   Procedure: REDO LEFT FEMORAL-ANTERIOR TIBIAL ARTERY BYPASS USING COMPOSITE CEPHALIC AND BASILIC VEIN GRAFT FROM LEFT ARM;  Surgeon: Mal Misty, MD;  Location: [place];  Service: Vascular;  Laterality: Left;  . FOOT SURGERY Right 03/20/2001   "have plates and screws in; didn't break it" (07/10/2012)  . GROIN DEBRIDEMENT Left 11/12/2012   Procedure: CLOSURE INGUINAL WOUND;  Surgeon: Mal Misty, MD;  Location: [place];  Service: Vascular;  Laterality: Left;  . I&D EXTREMITY Left 10/21/2012   Procedure: EXPLORATION AND DEBRIDEMENT OF LEFT GROIN WOUND;  Surgeon: Mal Misty, MD;  Location: [place];  Service: Vascular;  Laterality: Left;  . INSERT / REPLACE / REMOVE PACEMAKER  2007  . INTRAOPERATIVE ARTERIOGRAM  09/25/2011   Procedure: INTRA OPERATIVE ARTERIOGRAM;  Surgeon: Mal Misty, MD;  Location: [place];  Service: Vascular;  Laterality: Left;  . LEG AMPUTATION ABOVE KNEE Left 02/04/2013   DR LAWSON  . LOWER EXTREMITY ANGIOGRAM Left 12/25/2011   Procedure: LOWER EXTREMITY ANGIOGRAM;  Surgeon: Serafina Mitchell, MD;  Location: [facility] CATH LAB;  Service: Cardiovascular;  Laterality: Left;  lt leg angio co2  . LOWER EXTREMITY ANGIOGRAM N/A 07/07/2012   Procedure: LOWER EXTREMITY ANGIOGRAM;  Surgeon: Conrad Fairchance, MD;  Location: [facility] CATH LAB;  Service: Cardiovascular;  Laterality: N/A;  . LOWER EXTREMITY ANGIOGRAM N/A 09/15/2012   Procedure: LOWER EXTREMITY ANGIOGRAM;  Surgeon: Serafina Mitchell, MD;  Location: [facility] CATH LAB;  Service: Cardiovascular;  Laterality:  N/A;  . PATCH ANGIOPLASTY Left 10/21/2012   Procedure: PATCH ANGIOPLASTY;  Surgeon: Mal Misty, MD;  Location: [place];  Service: Vascular;  Laterality: Left;  . PERIPHERAL VASCULAR CATHETERIZATION N/A 11/08/2015   Procedure: Abdominal Aortogram w/Lower Extremity;  Surgeon: Waynetta Sandy, MD;  Location: [place] CV LAB;  Service: Cardiovascular;  Laterality: N/A;  . REMOVAL OF GRAFT Left 04/16/2013   Procedure: I & D LEFT AKA WOUND, POSSIBLE REMOVAL OF INFECTED GORTEX GRAFT;  Surgeon: Mal Misty, MD;  Location: [place];  Service: Vascular;  Laterality: Left;  . RENAL ARTERY STENT  2009  . SHOULDER OPEN ROTATOR CUFF REPAIR Right 2001   repair of lacerated right/notes 10/11/1999  (07/10/2012)  .  TONSILLECTOMY    . TURP VAPORIZATION    . UPPER EXTREMITY ANGIOGRAM  09/15/2012   Procedure: UPPER EXTREMITY ANGIOGRAM;  Surgeon: Serafina Mitchell, MD;  Location: [facility] CATH LAB;  Service: Cardiovascular;;  . UVULOPALATOPHARYNGOPLASTY (UPPP)/TONSILLECTOMY/SEPTOPLASTY  06/30/2003   Archie Endo 06/30/2003 (07/10/2012)   Past Medical History:  Diagnosis Date  . Arthritis   . Atrial fibrillation ([place])    takes Warfarin daily  . Automatic implantable cardioverter-defibrillator in situ   . Benign prostatic hypertrophy    takes Proscar daily  . CAD (coronary artery disease)    a. s/p mid LAD stenting with DES 2008 Dr. Daneen Schick  . CHF (congestive heart failure) ([place])    takes Lasix daily  . Chronic systolic dysfunction of left ventricle    a. mixed ischemic and nonischemic CM,  EF 35%. b. s/p AICD implantation.  . Constipation    takes Miralax daily as needed and Colace daily   . Depression   . ED (erectile dysfunction)   . Gout   . History of MRSA infection   . Hypertension    takes Carvedilol and Losartan daily  . ICD (implantable cardiac defibrillator) in place    medtronic, Dr. Rayann Heman  . ICD (implantable cardiac defibrillator) in place   . Insomnia    TAKES TRAZODONE NIGHTLY  . Numbness  and tingling    Hx; 88f left foot  . Pacemaker    medtronic  . PAD (peripheral artery disease) (HCC)    Severe by PV angiogram 09/2011  . PAD (peripheral artery disease) (HCC)    s/p multiple LLE bypass grafts; left mid-distal SCA occlusion by 08/2012 duplex  . Renal artery stenosis ([place])    s/p stenting 2009  . Sleep apnea    hx of "had surgery for"  . Type II diabetes mellitus (HCC)    takes Novolog 70/30   BP (!) 115/54   Pulse 62   Resp 14   SpO2 98%   Opioid Risk Score:   Fall Risk Score:  `1  Depression screen PHQ 2/9  Depression screen [facility] 2/9 01/02/2016 07/10/2015 05/09/2015 11/23/2014 06/03/2014  Decreased Interest 1 0 2 2 2   Down, Depressed, Hopeless 0 0 1 1 1   PHQ - 2 Score 1 0 3 3 3   Altered sleeping - - - - 3  Tired, decreased energy - - - - 2  Change in appetite - - - - 0  Feeling bad or failure about yourself  - - - - 0  Trouble concentrating - - - - 1  Moving slowly or fidgety/restless - - - - 0  Suicidal thoughts - - - - 0  PHQ-9 Score - - - - 9  Some recent data might be hidden       Review of Systems  Constitutional: Negative.   HENT: Negative.   Eyes: Negative.   Respiratory: Negative.   Cardiovascular: Negative.   Gastrointestinal: Positive for diarrhea.  Endocrine: Negative.   Genitourinary: Negative.   Musculoskeletal: Negative.   Skin: Negative.   Allergic/Immunologic: Negative.   Neurological: Negative.   Hematological: Negative.   Psychiatric/Behavioral: Negative.   All other systems reviewed and are negative.      Objective:   Physical Exam  Constitutional: He is oriented to person, place, and time. He appears well-developed and well-nourished.  HENT:  Head: Normocephalic and atraumatic.  Eyes: Conjunctivae are normal. Pupils are equal, round, and reactive to light.  Neck: Neck supple.  Cardiovascular: Normal rate and regular rhythm.  No rubs, gallops. Pacer site dressed  No murmur heard.  Respiratory: Effort normal and breath  sounds normal. No respiratory distress. He has no wheezes.  GI: Soft. Bowel sounds are normal. He exhibits no distension. There is no tenderness.  Musculoskeletal:  L-AKA prosthesis socket in place and appears to be fitting appropriately ?neuroma left AK stump distal to femur which is extremely tender with palpation Neurological: He is alert and oriented to person, place, and time.  Follows commands. Strength grossly intact in UE's at 5/5. RLE is 5/5 prox to 5/5 distally. Left HF 5/5. mild sensory loss in distal right leg/foot.  Skin: Right foot not examined. Psychiatric: He has a normal mood and affect. He is a little flat. Judgment and thought content normal. Cognition and memory are normal.     Assessment/Plan:  1.Hx of left AKA and now with developing issues concerning there RLE/PAD -continue with ambulation at home--this is imperative -prosthetic adjustments per [facility] 2. Phantom limb pain/stupm pain? neuroma- persistent pain -After informed consent and preparation of the skin with betadine and isopropyl alcohol, I injected 3mg  (1cc) of celestone and 2cc of 1% lidocaine around the left distal femoral stump via anterior approach. Additionally, aspiration was performed prior to injection. The patient tolerated well, and no complications were encountered. Afterward the area was cleaned and dressed. Post- injection instructions were provided.   -continue gabapentin 300mg  tid. Hasn't been able to tolerate a higher dose or other anticonvulsants. -continue percocet to 10/325,  q6 prn. #90 -still can consider sympathetic ganglion block for LLE pending his RLE mgt  3. Mood/phantom pain- increased cymbalta to 30mg  daily 4. Colitis---per primary team.   Follow up with me in about 1 months. 15 minutes of face to face patient care time were spent during this visit. All questions were encouraged and answered.

## 2016-04-01 NOTE — Addendum Note (Signed)
Addended by: Elinor Parkinson I on: 04/01/2016 11:09 AM   Modules accepted: Orders

## 2016-04-01 NOTE — Patient Instructions (Signed)
PLEASE FEEL FREE TO CALL OUR OFFICE WITH ANY PROBLEMS OR QUESTIONS ([phone])      

## 2016-04-03 ENCOUNTER — Telehealth: Payer: Self-pay | Admitting: Family Medicine

## 2016-04-03 ENCOUNTER — Other Ambulatory Visit: Payer: Self-pay | Admitting: General Practice

## 2016-04-03 MED ORDER — WARFARIN SODIUM 5 MG PO TABS: ORAL_TABLET | ORAL | 1 refills | Status: DC

## 2016-04-03 NOTE — Telephone Encounter (Signed)
Refill request for Warfarin 5 mg take as directed and a 90 day supply to [facility].

## 2016-04-06 LAB — TOXASSURE SELECT,+ANTIDEPR,UR

## 2016-04-08 ENCOUNTER — Encounter: Payer: Self-pay | Admitting: Family

## 2016-04-08 ENCOUNTER — Ambulatory Visit (INDEPENDENT_AMBULATORY_CARE_PROVIDER_SITE_OTHER): Payer: PPO | Admitting: Family

## 2016-04-08 ENCOUNTER — Ambulatory Visit (HOSPITAL_COMMUNITY)
Admission: RE | Admit: 2016-04-08 | Discharge: 2016-04-08 | Disposition: A | Discharge status: Home / Self Care | Payer: PPO | Source: Ambulatory Visit | Attending: Vascular Surgery | Admitting: Vascular Surgery

## 2016-04-08 VITALS — BP 137/59 | HR 65 | Temp 97.0°F | Resp 14 | Ht 68.0 in | Wt 172.0 lb

## 2016-04-08 DIAGNOSIS — M79605 Pain in left leg: Secondary | ICD-10-CM | POA: Diagnosis not present

## 2016-04-08 DIAGNOSIS — I739 Peripheral vascular disease, unspecified: Secondary | ICD-10-CM | POA: Insufficient documentation

## 2016-04-08 DIAGNOSIS — S78112A Complete traumatic amputation at level between left hip and knee, initial encounter: Secondary | ICD-10-CM

## 2016-04-08 DIAGNOSIS — E1151 Type 2 diabetes mellitus with diabetic peripheral angiopathy without gangrene: Secondary | ICD-10-CM | POA: Diagnosis not present

## 2016-04-08 DIAGNOSIS — M79652 Pain in left thigh: Secondary | ICD-10-CM | POA: Diagnosis not present

## 2016-04-08 DIAGNOSIS — Z89612 Acquired absence of left leg above knee: Secondary | ICD-10-CM | POA: Diagnosis not present

## 2016-04-08 DIAGNOSIS — I779 Disorder of arteries and arterioles, unspecified: Secondary | ICD-10-CM

## 2016-04-08 DIAGNOSIS — R2681 Unsteadiness on feet: Secondary | ICD-10-CM

## 2016-04-08 DIAGNOSIS — Z789 Other specified health status: Secondary | ICD-10-CM | POA: Diagnosis not present

## 2016-04-08 DIAGNOSIS — R29898 Other symptoms and signs involving the musculoskeletal system: Secondary | ICD-10-CM

## 2016-04-08 NOTE — Progress Notes (Signed)
[facility] [place]   CC: Follow up peripheral artery occlusive disease  History of Present Illness [name] is a 82 y.o. male patient of Dr. 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  He has a history of phantom pain in the left AKA stump and is followed by Dr. Naaman Plummer. He is on gabapentin and tramadol for pain control. He continues to take coumadin daily and has a Psychologist, forensic.  On 11-22-15 Dr. Donzetta Matters performed an Aortogram with Right lower extremity runoff              - Drug coated balloon angioplasty Right SFA 17mm              - Drug coated balloon angioplasty Right popliteal 62mm              - Balloon angioplasty  tp trunk and peroneal artery with 67mm balloon               He has had a history of claudication in the left upper extremity with numbness and tingling in the left hand. He has known left subclavian occlusion that is asymptomatic.   He gets around in a motorized scooter.   He was last evaluated by Dr. Donzetta Matters on 12-22-15. At that time pt was status post endovascular revascularization of the right. He has mostly healed his wounds on his right toes. He continues on Coumadin and aspirin and does not have any pain at this time.  Pt was to f/u in 6 months with repeat RLE duplex and ABI Dr. Donzetta Matters advised pt to call if has issues prior Dr. Donzetta Matters instructed to protect with R foot and ok for compression stockings on right.  He returns today after rec'd phone call from pt on 03-08-16.  C/o approx. 2 month hx. of his left AKA stump feeling "like there is a strap around the leg."  Stated the tightness is noticeable when he attempts to lift up the leg.  Reported the tight sensation starts approx. 2 inches from the bottom of the stump.  Reported when he applies his prosthesis, the stump feels sore.  Denies any redness, open sores, or drainage from left AKA stump.  Denies swelling of the stump.  Requested appt. to have this checked.  Discussed pt. with Dr. Donzetta Matters.   Recommended to schedule Aorto-iliac duplex and OV.     Pt Diabetic: Yes, pt states his last A1C was in control Pt smoker: never smoker  Pt meds include: Statin :simvastatin Betablocker: Yes ASA: yes Other anticoagulants/antiplatelets: warfarin     Past Medical History:  Diagnosis Date  . Arthritis   . Atrial fibrillation ([place])    takes Warfarin daily  . Automatic implantable cardioverter-defibrillator in situ   . Benign prostatic hypertrophy    takes Proscar daily  . CAD (coronary artery disease)    a. s/p mid LAD stenting with DES 2008 Dr. Daneen Schick  . CHF (congestive heart failure) ([place])    takes Lasix daily  . Chronic systolic dysfunction of left ventricle    a. mixed ischemic and nonischemic CM,  EF 35%. b. s/p AICD implantation.  . Constipation    takes Miralax daily as needed and Colace daily   . Depression   . ED (erectile dysfunction)   . Gout   . History of MRSA infection   . Hypertension    takes Carvedilol and Losartan daily  . ICD (implantable cardiac defibrillator) in place  medtronic, Dr. Rayann Heman  . ICD (implantable cardiac defibrillator) in place   . Insomnia    TAKES TRAZODONE NIGHTLY  . Numbness and tingling    Hx; 61f left foot  . Pacemaker    medtronic  . PAD (peripheral artery disease) (HCC)    Severe by PV angiogram 09/2011  . PAD (peripheral artery disease) (HCC)    s/p multiple LLE bypass grafts; left mid-distal SCA occlusion by 08/2012 duplex  . Renal artery stenosis ([place])    s/p stenting 2009  . Sleep apnea    hx of "had surgery for"  . Type II diabetes mellitus ([place])    takes Novolog 70/30    Social History Social History  Substance Use Topics  . Smoking status: Current Some Day Smoker    Types: Cigars  . Smokeless tobacco: Never Used     Comment: 1 cigar each month  . Alcohol use 0.0 oz/week     Comment: once a week wine    Family History Family History  Problem Relation Age of Onset  . Cancer Father   . Cancer       breast/fhx  . Heart disease      fhx  . Diabetes Neg Hx     Past Surgical History:  Procedure Laterality Date  . ABDOMINAL ANGIOGRAM  12/25/2011   Procedure: ABDOMINAL ANGIOGRAM;  Surgeon: Serafina Mitchell, MD;  Location: [facility] CATH LAB;  Service: Cardiovascular;;  . ABDOMINAL AORTAGRAM N/A 09/11/2011   Procedure: ABDOMINAL Maxcine Ham;  Surgeon: Wellington Hampshire, MD;  Location: [place] CATH LAB;  Service: Cardiovascular;  Laterality: N/A;  . ADENOIDECTOMY     Hx; of   . AMPUTATION Left 02/04/2013   Procedure: AMPUTATION ABOVE KNEE-LEFT;  Surgeon: Mal Misty, MD;  Location: [place];  Service: Vascular;  Laterality: Left;  . BICEPS TENDON REPAIR Right 2001   Archie Endo 03/20/2001 (07/10/2012)  . BIV ICD GENERTAOR CHANGE OUT N/A 11/09/2013   Procedure: BIV ICD GENERTAOR CHANGE OUT;  Surgeon: Coralyn Mark, MD;  Location: [facility] CATH LAB;  Service: Cardiovascular;  Laterality: N/A;  . CARDIAC DEFIBRILLATOR PLACEMENT  12/26/09   pacemaker combo  . CARPAL TUNNEL RELEASE Right 2002   Archie Endo 03/20/2001 (07/10/2012)  . cervical epidural injection  2013  . COLONOSCOPY     Hx; of  . CORONARY ANGIOPLASTY    . CORONARY ANGIOPLASTY WITH STENT PLACEMENT  ~ 2000  . EMBOLECTOMY  02/07/2012   Procedure: EMBOLECTOMY;  Surgeon: Rosetta Posner, MD;  Location: [place];  Service: Vascular;  Laterality: Left;  . EMBOLECTOMY Left 12/16/2012   Procedure: THROMBECTOMY  LEFT LEG BYPASS;  Surgeon: Elam Dutch, MD;  Location: [place];  Service: Vascular;  Laterality: Left;  . FEMORAL-POPLITEAL BYPASS GRAFT Left 09/16/2012   Procedure: LEFT FEMORAL-POPLITEAL BYPASS GRAFT WITH GORTEX Propaten Graft 6x80 Thin Wall and Left lower leg Angiogram;  Surgeon: Mal Misty, MD;  Location: [place];  Service: Vascular;  Laterality: Left;  . FEMORAL-TIBIAL BYPASS GRAFT  09/25/2011   Procedure: BYPASS GRAFT FEMORAL-TIBIAL ARTERY;  Surgeon: Mal Misty, MD;  Location: [facility] OR;  Service: Vascular;  Laterality: Left;  Left Femoral - Anterior Tibial  Bypass;  saphenous vein graft from left leg  . FEMORAL-TIBIAL BYPASS GRAFT  02/07/2012   Procedure: BYPASS GRAFT FEMORAL-TIBIAL ARTERY;  Surgeon: Rosetta Posner, MD;  Location: [place];  Service: Vascular;  Laterality: Left;  Thrombectomy Left Femoral - Tibial Bypass Graft  . FEMORAL-TIBIAL BYPASS GRAFT  04/03/2012  Procedure: BYPASS GRAFT FEMORAL-TIBIAL ARTERY;  Surgeon: Mal Misty, MD;  Location: [place];  Service: Vascular;  Laterality: Left;  Redo  . FEMORAL-TIBIAL BYPASS GRAFT Left 09/30/2012   Procedure: REDO LEFT FEMORAL-ANTERIOR TIBIAL ARTERY BYPASS USING COMPOSITE CEPHALIC AND BASILIC VEIN GRAFT FROM LEFT ARM;  Surgeon: Mal Misty, MD;  Location: [place];  Service: Vascular;  Laterality: Left;  . FOOT SURGERY Right 03/20/2001   "have plates and screws in; didn't break it" (07/10/2012)  . GROIN DEBRIDEMENT Left 11/12/2012   Procedure: CLOSURE INGUINAL WOUND;  Surgeon: Mal Misty, MD;  Location: [place];  Service: Vascular;  Laterality: Left;  . I&D EXTREMITY Left 10/21/2012   Procedure: EXPLORATION AND DEBRIDEMENT OF LEFT GROIN WOUND;  Surgeon: Mal Misty, MD;  Location: [place];  Service: Vascular;  Laterality: Left;  . INSERT / REPLACE / REMOVE PACEMAKER  2007  . INTRAOPERATIVE ARTERIOGRAM  09/25/2011   Procedure: INTRA OPERATIVE ARTERIOGRAM;  Surgeon: Mal Misty, MD;  Location: [place];  Service: Vascular;  Laterality: Left;  . LEG AMPUTATION ABOVE KNEE Left 02/04/2013   DR LAWSON  . LOWER EXTREMITY ANGIOGRAM Left 12/25/2011   Procedure: LOWER EXTREMITY ANGIOGRAM;  Surgeon: Serafina Mitchell, MD;  Location[facility] CATH LAB;  Service: Cardiovascular;  Laterality: Left;  lt leg angio co2  . LOWER EXTREMITY ANGIOGRAM N/A 07/07/2012   Procedure: LOWER EXTREMITY ANGIOGRAM;  Surgeon: Conrad Buckhannon, MD;  Location: [facility] CATH LAB;  Service: Cardiovascular;  Laterality: N/A;  . LOWER EXTREMITY ANGIOGRAM N/A 09/15/2012   Procedure: LOWER EXTREMITY ANGIOGRAM;  Surgeon: Serafina Mitchell, MD;  Location: [facility] CATH  LAB;  Service: Cardiovascular;  Laterality: N/A;  . PATCH ANGIOPLASTY Left 10/21/2012   Procedure: PATCH ANGIOPLASTY;  Surgeon: Mal Misty, MD;  Location: [place];  Service: Vascular;  Laterality: Left;  . PERIPHERAL VASCULAR CATHETERIZATION N/A 11/08/2015   Procedure: Abdominal Aortogram w/Lower Extremity;  Surgeon: Waynetta Sandy, MD;  Location: [place] CV LAB;  Service: Cardiovascular;  Laterality: N/A;  . REMOVAL OF GRAFT Left 04/16/2013   Procedure: I & D LEFT AKA WOUND, POSSIBLE REMOVAL OF INFECTED GORTEX GRAFT;  Surgeon: Mal Misty, MD;  Location: [place];  Service: Vascular;  Laterality: Left;  . RENAL ARTERY STENT  2009  . SHOULDER OPEN ROTATOR CUFF REPAIR Right 2001   repair of lacerated right/notes 10/11/1999  (07/10/2012)  . TONSILLECTOMY    . TURP VAPORIZATION    . UPPER EXTREMITY ANGIOGRAM  09/15/2012   Procedure: UPPER EXTREMITY ANGIOGRAM;  Surgeon: Serafina Mitchell, MD;  Location: [facility] CATH LAB;  Service: Cardiovascular;;  . UVULOPALATOPHARYNGOPLASTY (UPPP)/TONSILLECTOMY/SEPTOPLASTY  06/30/2003   Archie Endo 06/30/2003 (07/10/2012)    Allergies  Allergen Reactions  . Other Other (See Comments)    Plastic tape tears skin  . Bactrim Ds [Sulfamethoxazole-Trimethoprim] Other (See Comments)    Hand tremors    Current Outpatient Prescriptions  Medication Sig Dispense Refill  . allopurinol (ZYLOPRIM) 100 MG tablet Take 1 tablet (100 mg total) by mouth daily as needed (Gout). 90 tablet 3  . amLODipine (NORVASC) 5 MG tablet Take 1 tablet (5 mg total) by mouth daily. 90 tablet 3  . aspirin EC 81 MG tablet Take 81 mg by mouth daily.    . carvedilol (COREG) 25 MG tablet Take 1 tablet (25 mg total) by mouth 2 (two) times daily with a meal. 180 tablet 3  . finasteride (PROSCAR) 5 MG tablet Take 1 tablet (5 mg total) by mouth daily.  90 tablet 3  . furosemide (LASIX) 40 MG tablet Take 1 tablet (40 mg total) by mouth daily. 90 tablet 3  . gabapentin (NEURONTIN) 300 MG capsule Take 2  capsules (600 mg total) by mouth 3 (three) times daily. 540 capsule 3  . insulin aspart protamine- aspart (NOVOLOG MIX 70/30) (70-30) 100 UNIT/ML injection Inject 7 Units into the skin 2 (two) times daily with a meal.    . Insulin Pen Needle 31G X 8 MM MISC Use 2-3 times per day and diagnosis code is 250.00 100 each 1  . losartan (COZAAR) 100 MG tablet Take 1 tablet (100 mg total) by mouth daily. 90 tablet 3  . nitroGLYCERIN (NITROSTAT) 0.4 MG SL tablet Place 1 tablet (0.4 mg total) under the tongue every 5 (five) minutes as needed for chest pain. For chest pain, max 3 doses 90 tablet 3  . ONE TOUCH ULTRA TEST test strip USE TO TEST BLOOD SUGAR TWO TIMES DAILY AS INSTRUCTED BY PHYSICIAN. 100 each 4  . oxyCODONE-acetaminophen (PERCOCET) 10-325 MG tablet Take 1 tablet by mouth every 6 (six) hours as needed for pain. 90 tablet 0  . warfarin (COUMADIN) 5 MG tablet TAKE AS DIRECTED BY ANTICOAGULATION CLINIC 60 tablet 1  . amoxicillin-clavulanate (AUGMENTIN) 500-125 MG tablet Take 1 tablet (500 mg total) by mouth 3 (three) times daily. (Patient not taking: Reported on 04/08/2016) 20 tablet 0  . benzonatate (TESSALON) 200 MG capsule Take 1 capsule (200 mg total) by mouth 2 (two) times daily as needed for cough. (Patient not taking: Reported on 04/08/2016) 20 capsule 0  . DULoxetine (CYMBALTA) 30 MG capsule Take 1 capsule (30 mg total) by mouth daily. (Patient not taking: Reported on 04/08/2016) 30 capsule 2  . methylPREDNISolone (MEDROL DOSEPAK) 4 MG TBPK tablet Take as directed (Patient not taking: Reported on 04/08/2016) 21 tablet 0  . sildenafil (VIAGRA) 100 MG tablet Take as directed (Patient not taking: Reported on 04/08/2016) 5 tablet 0   No current facility-administered medications for this visit.     ROS: See HPI for pertinent positives and negatives.   Physical Examination  Vitals:   04/08/16 0954  BP: (!) 137/59  Pulse: 65  Resp: 14  Temp: 97 F (36.1 C)  SpO2: 100%  Weight: 172 lb (78 kg)   Height: 5\' 8"  (1.727 m)   Body mass index is 26.15 kg/m.  General: A&O x 3, WDWN, in whhelchair Pulmonary: Respirations are non labored, CTAB Cardiac: regular rhythm   VASCULAR EXAM: Extremitieswithout ischemic changes, without Gangrene;  ulcer at medial aspect of right 5th toe, has completely healed, no remaining wounds or ulcers. Left AKA, wearing prosthesis.       LE Pulses LEFT RIGHT   FEMORAL  palpable  palpable    POPLITEAL AKA  not palpable   POSTERIOR TIBIAL AKA  not palpable    DORSALIS PEDIS  ANTERIOR TIBIAL AKA  not palpable    Abdomen: soft, NT, no palpable masses. Skin: no rashes, see Extremities. Musculoskeletal: no muscle wasting or atrophy. Neurologic: A&O X 3; Appropriate Affect ; SENSATION: normal; MOTOR FUNCTION: moving all extremities equally. Speech is fluent/normal. CN 2-12 grossly intact     ASSESSMENT: [name] is a 82 y.o. male who is s/p left AKA Feb 04, 2013. On 04/16/2013 he was taken to the OR for an infected sinus tract of his left AKA stump, an I&D was performed.  On 11-22-15 he had a drug coated balloon angioplasty Right SFA 43mm, drug coated  balloon angioplasty Right popliteal 37mm, and balloon angioplasty  tp trunk and peroneal artery with 25mm balloon by Dr. Donzetta Matters. The ulcer on his right 5th toe, has completely healed, no remaining wounds or ulcers. His left AKA stump has remained well healed, he is wearing his prosthesis.  He takes coumadin.   He denies any problems with his right leg/foot.  He only has tightness in the left mid thigh anterior stump when the prosthesis is not in place and he lifts the stump. He also has phantom pain in the left lower leg, takes gabapentin and tramadol for this.  He rarely walks with  his walker as he states his arms are too weak. Discussed with pt; he is willing to try a course or physical therapy to strengthen his arms to walk better with a walker.  Pt's primary atherosclerotic risk factor remains his DM, currently in control. Fortunately he has never used tobacco.  DATA  11-17-15 Right ABI remained unchanged from 2 months prior when he had no open wounds, no edema, and no erythema in his right ankle and foot which he did have at that time.   The ulcer on the medial aspect of his right 5th toe has completely healed since the He denies injury to his right foot or toes.   06/16/14 carotid duplex at [facility] indicated minimal bilateral ICA stenosies.     PLAN:  Physical therapy as above.   Based on the patient's vascular studies and examination, pt will follow up with Dr. Donzetta Matters as his soonest available clinic appointment to discuss left anterior AKA stump tight feeling that only occurs when he lifts the stump and not wearing the prosthesis.   I discussed in depth with the patient the nature of atherosclerosis, and emphasized the importance of maximal medical management including strict control of blood pressure, blood glucose, and lipid levels, obtaining regular exercise, and continued cessation of smoking.  The patient is aware that without maximal medical management the underlying atherosclerotic disease process will progress, limiting the benefit of any interventions.  The patient was given information about PAD including signs, symptoms, treatment, what symptoms should prompt the patient to seek immediate medical care, and risk reduction measures to take.  Clemon Chambers, RN, MSN, FNP-C [facility] [employer] Phone: [phone]  Clinic MD: Trula Slade  04/08/16 10:05 AM

## 2016-04-08 NOTE — Patient Instructions (Signed)

## 2016-04-10 ENCOUNTER — Encounter: Payer: Self-pay | Admitting: Nurse Practitioner

## 2016-04-10 NOTE — Progress Notes (Signed)
Electrophysiology Office Note Date: 04/11/2016  ID:  [name], DOB 1935/01/16, MRN [id number]  PCP: [name], MD Primary Cardiologist: Tamala Julian Electrophysiologist: Allred  CC: Routine ICD follow-up  [name] is a 82 y.o. male seen today for Dr Rayann Heman.  He presents today for routine electrophysiology followup.  Since last being seen in our clinic, the patient reports doing relatively well.  He has a feeling of shortness of breath while talking but not exertion. He does not feel that he has gained fluid weight.  He does not have orthopnea.  He denies chest pain, palpitations, PND, orthopnea, nausea, vomiting, dizziness, syncope, edema, weight gain, or early satiety.  He has not had ICD shocks.   Device History: MDT CRTD implanted 2011 for NICM, CHF; gen change 2015 History of appropriate therapy: No History of AAD therapy: No   Past Medical History:  Diagnosis Date  . Arthritis   . Atrial fibrillation ([place])   . Benign prostatic hypertrophy   . CAD (coronary artery disease)    a. s/p mid LAD stenting with DES 2008 Dr. Daneen Schick  . Chronic systolic dysfunction of left ventricle    a. mixed ischemic and nonischemic CM,  EF 35%. b. s/p AICD implantation.  . Constipation   . Depression   . ED (erectile dysfunction)   . Gout   . History of MRSA infection   . Hypertension    takes Carvedilol and Losartan daily  . Insomnia   . PAD (peripheral artery disease) (H[facility])    s/p multiple LLE bypass grafts; left mid-distal SCA occlusion by 08/2012 duplex  . Renal artery stenosis ([place])    s/p stenting 2009  . Sleep apnea    hx of "had surgery for"  . Type II diabetes mellitus ([place])    Past Surgical History:  Procedure Laterality Date  . ABDOMINAL ANGIOGRAM  12/25/2011   Procedure: ABDOMINAL ANGIOGRAM;  Surgeon: Serafina Mitchell, MD;  Location: [facility] CATH LAB;  Service: Cardiovascular;;  . ABDOMINAL AORTAGRAM N/A 09/11/2011   Procedure: ABDOMINAL Maxcine Ham;  Surgeon:  Wellington Hampshire, MD;  Location: [place] CATH LAB;  Service: Cardiovascular;  Laterality: N/A;  . ADENOIDECTOMY     Hx; of   . AMPUTATION Left 02/04/2013   Procedure: AMPUTATION ABOVE KNEE-LEFT;  Surgeon: Mal Misty, MD;  Location: [place];  Service: Vascular;  Laterality: Left;  . BICEPS TENDON REPAIR Right 2001   Archie Endo 03/20/2001 (07/10/2012)  . BIV ICD GENERTAOR CHANGE OUT N/A 11/09/2013   Procedure: BIV ICD GENERTAOR CHANGE OUT;  Surgeon: Coralyn Mark, MD;  Location: [facility] CATH LAB;  Service: Cardiovascular;  Laterality: N/A;  . CARDIAC DEFIBRILLATOR PLACEMENT  12/26/09   pacemaker combo  . CARPAL TUNNEL RELEASE Right 2002   Archie Endo 03/20/2001 (07/10/2012)  . cervical epidural injection  2013  . COLONOSCOPY     Hx; of  . CORONARY ANGIOPLASTY    . CORONARY ANGIOPLASTY WITH STENT PLACEMENT  ~ 2000  . EMBOLECTOMY  02/07/2012   Procedure: EMBOLECTOMY;  Surgeon: Rosetta Posner, MD;  Location: [place];  Service: Vascular;  Laterality: Left;  . EMBOLECTOMY Left 12/16/2012   Procedure: THROMBECTOMY  LEFT LEG BYPASS;  Surgeon: Elam Dutch, MD;  Location: [place];  Service: Vascular;  Laterality: Left;  . FEMORAL-POPLITEAL BYPASS GRAFT Left 09/16/2012   Procedure: LEFT FEMORAL-POPLITEAL BYPASS GRAFT WITH GORTEX Propaten Graft 6x80 Thin Wall and Left lower leg Angiogram;  Surgeon: Mal Misty, MD;  Location: [place];  Service: Vascular;  Laterality: Left;  . FEMORAL-TIBIAL BYPASS GRAFT  09/25/2011   Procedure: BYPASS GRAFT FEMORAL-TIBIAL ARTERY;  Surgeon: Mal Misty, MD;  Location: [facility] OR;  Service: Vascular;  Laterality: Left;  Left Femoral - Anterior Tibial Bypass;  saphenous vein graft from left leg  . FEMORAL-TIBIAL BYPASS GRAFT  02/07/2012   Procedure: BYPASS GRAFT FEMORAL-TIBIAL ARTERY;  Surgeon: Rosetta Posner, MD;  Location: [place];  Service: Vascular;  Laterality: Left;  Thrombectomy Left Femoral - Tibial Bypass Graft  . FEMORAL-TIBIAL BYPASS GRAFT  04/03/2012   Procedure: BYPASS GRAFT FEMORAL-TIBIAL  ARTERY;  Surgeon: Mal Misty, MD;  Location: [place];  Service: Vascular;  Laterality: Left;  Redo  . FEMORAL-TIBIAL BYPASS GRAFT Left 09/30/2012   Procedure: REDO LEFT FEMORAL-ANTERIOR TIBIAL ARTERY BYPASS USING COMPOSITE CEPHALIC AND BASILIC VEIN GRAFT FROM LEFT ARM;  Surgeon: Mal Misty, MD;  Location: [place];  Service: Vascular;  Laterality: Left;  . FOOT SURGERY Right 03/20/2001   "have plates and screws in; didn't break it" (07/10/2012)  . GROIN DEBRIDEMENT Left 11/12/2012   Procedure: CLOSURE INGUINAL WOUND;  Surgeon: Mal Misty, MD;  Location: [place];  Service: Vascular;  Laterality: Left;  . I&D EXTREMITY Left 10/21/2012   Procedure: EXPLORATION AND DEBRIDEMENT OF LEFT GROIN WOUND;  Surgeon: Mal Misty, MD;  Location: [place];  Service: Vascular;  Laterality: Left;  . INSERT / REPLACE / REMOVE PACEMAKER  2007  . INTRAOPERATIVE ARTERIOGRAM  09/25/2011   Procedure: INTRA OPERATIVE ARTERIOGRAM;  Surgeon: Mal Misty, MD;  Location: [place];  Service: Vascular;  Laterality: Left;  . LEG AMPUTATION ABOVE KNEE Left 02/04/2013   DR LAWSON  . LOWER EXTREMITY ANGIOGRAM Left 12/25/2011   Procedure: LOWER EXTREMITY ANGIOGRAM;  Surgeon: Serafina Mitchell, MD;  Location: [facility] CATH LAB;  Service: Cardiovascular;  Laterality: Left;  lt leg angio co2  . LOWER EXTREMITY ANGIOGRAM N/A 07/07/2012   Procedure: LOWER EXTREMITY ANGIOGRAM;  Surgeon: Conrad [place], MD;  Location: [facility] CATH LAB;  Service: Cardiovascular;  Laterality: N/A;  . LOWER EXTREMITY ANGIOGRAM N/A 09/15/2012   Procedure: LOWER EXTREMITY ANGIOGRAM;  Surgeon: Serafina Mitchell, MD;  Location: [facility] CATH LAB;  Service: Cardiovascular;  Laterality: N/A;  . PATCH ANGIOPLASTY Left 10/21/2012   Procedure: PATCH ANGIOPLASTY;  Surgeon: Mal Misty, MD;  Location: [place];  Service: Vascular;  Laterality: Left;  . PERIPHERAL VASCULAR CATHETERIZATION N/A 11/08/2015   Procedure: Abdominal Aortogram w/Lower Extremity;  Surgeon: Waynetta Sandy, MD;   Location: [place] CV LAB;  Service: Cardiovascular;  Laterality: N/A;  . REMOVAL OF GRAFT Left 04/16/2013   Procedure: I & D LEFT AKA WOUND, POSSIBLE REMOVAL OF INFECTED GORTEX GRAFT;  Surgeon: Mal Misty, MD;  Location: [place];  Service: Vascular;  Laterality: Left;  . RENAL ARTERY STENT  2009  . SHOULDER OPEN ROTATOR CUFF REPAIR Right 2001   repair of lacerated right/notes 10/11/1999  (07/10/2012)  . TONSILLECTOMY    . TURP VAPORIZATION    . UPPER EXTREMITY ANGIOGRAM  09/15/2012   Procedure: UPPER EXTREMITY ANGIOGRAM;  Surgeon: Serafina Mitchell, MD;  Location: [facility] CATH LAB;  Service: Cardiovascular;;  . UVULOPALATOPHARYNGOPLASTY (UPPP)/TONSILLECTOMY/SEPTOPLASTY  06/30/2003   Archie Endo 06/30/2003 (07/10/2012)    Current Outpatient Prescriptions  Medication Sig Dispense Refill  . allopurinol (ZYLOPRIM) 100 MG tablet Take 1 tablet (100 mg total) by mouth daily as needed (Gout). 90 tablet 3  . amLODipine (NORVASC) 5 MG tablet Take 1 tablet (5  mg total) by mouth daily. 90 tablet 3  . aspirin EC 81 MG tablet Take 81 mg by mouth daily.    . carvedilol (COREG) 25 MG tablet Take 1 tablet (25 mg total) by mouth 2 (two) times daily with a meal. 180 tablet 3  . finasteride (PROSCAR) 5 MG tablet Take 1 tablet (5 mg total) by mouth daily. 90 tablet 3  . furosemide (LASIX) 40 MG tablet Take 1 tablet (40 mg total) by mouth daily. 90 tablet 3  . gabapentin (NEURONTIN) 300 MG capsule Take 2 capsules (600 mg total) by mouth 3 (three) times daily. 540 capsule 3  . insulin aspart protamine- aspart (NOVOLOG MIX 70/30) (70-30) 100 UNIT/ML injection Inject 7 Units into the skin 2 (two) times daily with a meal.    . Insulin Pen Needle 31G X 8 MM MISC Use 2-3 times per day and diagnosis code is 250.00 100 each 1  . losartan (COZAAR) 100 MG tablet Take 1 tablet (100 mg total) by mouth daily. 90 tablet 3  . methylPREDNISolone (MEDROL DOSEPAK) 4 MG TBPK tablet Take as directed 21 tablet 0  . nitroGLYCERIN (NITROSTAT) 0.4 MG  SL tablet Place 1 tablet (0.4 mg total) under the tongue every 5 (five) minutes as needed for chest pain. For chest pain, max 3 doses 90 tablet 3  . ONE TOUCH ULTRA TEST test strip USE TO TEST BLOOD SUGAR TWO TIMES DAILY AS INSTRUCTED BY PHYSICIAN. 100 each 4  . oxyCODONE-acetaminophen (PERCOCET) 10-325 MG tablet Take 1 tablet by mouth every 6 (six) hours as needed for pain. 90 tablet 0  . sildenafil (VIAGRA) 100 MG tablet Take as directed 5 tablet 0  . warfarin (COUMADIN) 5 MG tablet TAKE AS DIRECTED BY ANTICOAGULATION CLINIC 60 tablet 1   No current facility-administered medications for this visit.     Allergies:   Other and Bactrim ds [sulfamethoxazole-trimethoprim]   Social History: Social History   Social History  . Marital status: Widowed    Spouse name: N/A  . Number of children: N/A  . Years of education: N/A   Occupational History  . Retired Retired   Social History Main Topics  . Smoking status: Current Some Day Smoker    Types: Cigars  . Smokeless tobacco: Never Used     Comment: 1 cigar each month  . Alcohol use 0.0 oz/week     Comment: once a week wine  . Drug use: No  . Sexual activity: Not Currently   Other Topics Concern  . Not on file   Social History Narrative   Lives in [place] with significant other,  [name].    Family History: Family History  Problem Relation Age of Onset  . Cancer Father   . Cancer      breast/fhx  . Heart disease      fhx  . Diabetes Neg Hx     Review of Systems: All other systems reviewed and are otherwise negative except as noted above.   Physical Exam: VS:  BP 122/74   Pulse (!) 110   Ht 5\' 8"  (1.727 m)   Wt 177 lb (80.3 kg)   BMI 26.91 kg/m  , BMI Body mass index is 26.91 kg/m.  GEN- The patient is well appearing, alert and oriented x 3 today.   HEENT: normocephalic, atraumatic; sclera clear, conjunctiva pink; hearing intact; oropharynx clear; neck supple  Lungs- Clear to ausculation bilaterally, normal  work of breathing.  No wheezes, rales, rhonchi Heart- Regular rate  and rhythm  GI- soft, non-tender, non-distended, bowel sounds present  Extremities- no clubbing, cyanosis, or edema, L AKA MS- no significant deformity or atrophy Skin- warm and dry, no rash or lesion; ICD pocket well healed Psych- euthymic mood, full affect Neuro- strength and sensation are intact  ICD interrogation- reviewed in detail today,  See PACEART report  EKG:  EKG is not ordered today.  Recent Labs: 08/28/2015: TSH 3.18 01/01/2016: ALT 11; Hemoglobin 13.6; Platelets 163.0 03/01/2016: BUN 45; Creat 1.93; Potassium 4.4; Sodium 139   Wt Readings from Last 3 Encounters:  04/11/16 177 lb (80.3 kg)  04/08/16 172 lb (78 kg)  03/22/16 182 lb (82.6 kg)     Other studies Reviewed: Additional studies/ records that were reviewed today include: Dr Rayann Heman and Dr Thompson Caul office notes  Assessment and Plan:  1.  Chronic systolic dysfunction euvolemic today Stable on an appropriate medical regimen Normal ICD function See Pace Art report No changes today Continue follow up in ICM clinic EF normalized post CRT I have advised that he could try a 2 day increase in Lasix to see if helps shortness of breath while talking (would not do more than that with CKD).  If no benefit, would consider PFT's.   2.  Permanent atrial fibrillation V rates controlled Continue Warfarin for CHADS2VASC of 7  3.  CAD No recent ischemic symptoms Continue medical therapy    Current medicines are reviewed at length with the patient today.   The patient does not have concerns regarding his medicines.  The following changes were made today:  none  Labs/ tests ordered today include: none No orders of the defined types were placed in this encounter.    Disposition:   Follow up with ICM clinic, [facility], Dr Tamala Julian as scheduled, Dr Rayann Heman 1 year     Signed, Chanetta Marshall, NP 04/11/2016 10:45 AM  [place] [address] [phone] (office) [phone] (fax

## 2016-04-11 ENCOUNTER — Ambulatory Visit (INDEPENDENT_AMBULATORY_CARE_PROVIDER_SITE_OTHER): Payer: PPO | Admitting: Nurse Practitioner

## 2016-04-11 VITALS — BP 122/74 | HR 110 | Ht 68.0 in | Wt 177.0 lb

## 2016-04-11 DIAGNOSIS — I482 Chronic atrial fibrillation: Secondary | ICD-10-CM | POA: Diagnosis not present

## 2016-04-11 DIAGNOSIS — I5022 Chronic systolic (congestive) heart failure: Secondary | ICD-10-CM | POA: Diagnosis not present

## 2016-04-11 DIAGNOSIS — I4821 Permanent atrial fibrillation: Secondary | ICD-10-CM

## 2016-04-11 DIAGNOSIS — I2510 Atherosclerotic heart disease of native coronary artery without angina pectoris: Secondary | ICD-10-CM | POA: Diagnosis not present

## 2016-04-11 LAB — CUP PACEART INCLINIC DEVICE CHECK
Implantable Lead Implant Date: 20111024
Implantable Lead Implant Date: 20111024
Implantable Lead Implant Date: 20111024
Implantable Lead Location: 753859
Implantable Lead Model: 5076
Implantable Lead Model: 6947
Implantable Pulse Generator Implant Date: 20150908
MDC IDC LEAD LOCATION: 753858
MDC IDC LEAD LOCATION: 753860
MDC IDC SESS DTM: 20180208104859

## 2016-04-11 NOTE — Patient Instructions (Signed)
Medication Instructions:   Your physician recommends that you continue on your current medications as directed. Please refer to the Current Medication list given to you today.   If you need a refill on your cardiac medications before your next appointment, please call your pharmacy.  Labwork: NONE ORDERED  TODAY    Testing/Procedures: NONE ORDERED  TODAY    Follow-Up: Your physician wants you to follow-up in: [place] will receive a reminder letter in the mail two months in advance. If you don't receive a letter, please call our office to schedule the follow-up appointment.   Remote monitoring is used to monitor your Pacemaker of ICD from home. This monitoring reduces the number of office visits required to check your device to one time per year. It allows [place] to keep an eye on the functioning of your device to ensure it is working properly. You are scheduled for a device check from home on . 07/10/2016..You may send your transmission at any time that day. If you have a wireless device, the transmission will be sent automatically. After your physician reviews your transmission, you will receive a postcard with your next transmission date.    Any Other Special Instructions Will Be Listed Below (If Applicable).

## 2016-04-15 ENCOUNTER — Ambulatory Visit (INDEPENDENT_AMBULATORY_CARE_PROVIDER_SITE_OTHER): Payer: PPO | Admitting: General Practice

## 2016-04-15 DIAGNOSIS — I482 Chronic atrial fibrillation, unspecified: Secondary | ICD-10-CM

## 2016-04-15 DIAGNOSIS — Z5181 Encounter for therapeutic drug level monitoring: Secondary | ICD-10-CM

## 2016-04-15 LAB — POCT INR: INR: 3.9

## 2016-04-15 NOTE — Patient Instructions (Signed)
Pre visit review using our clinic review tool, if applicable. No additional management support is needed unless otherwise documented below in the visit note. 

## 2016-04-22 NOTE — Progress Notes (Signed)
Urine drug screen for this encounter is consistent for prescribed medication 

## 2016-04-24 ENCOUNTER — Ambulatory Visit: Payer: PPO | Admitting: Gastroenterology

## 2016-04-26 ENCOUNTER — Encounter: Payer: Self-pay | Admitting: Vascular Surgery

## 2016-04-29 ENCOUNTER — Ambulatory Visit (INDEPENDENT_AMBULATORY_CARE_PROVIDER_SITE_OTHER): Payer: PPO

## 2016-04-29 DIAGNOSIS — I5022 Chronic systolic (congestive) heart failure: Secondary | ICD-10-CM

## 2016-04-29 DIAGNOSIS — Z95810 Presence of automatic (implantable) cardiac defibrillator: Secondary | ICD-10-CM | POA: Diagnosis not present

## 2016-04-29 NOTE — Progress Notes (Signed)
EPIC Encounter for ICM Monitoring  Patient Name: [name] is a 82 y.o. male Date: 04/29/2016 Primary Care Physican: [name], MD Primary Cardiologist:Smith Electrophysiologist: Allred Dry Weight:Unknown - amputee Bi-V Pacing: 99.8% Time in AT/AF 24.0 hr/day (100.0%)                                            Heart Failure questions reviewed, pt symptomatic with hand swelling and increased shortness of breath for last 4 days.   Thoracic impedance abnormal suggesting fluid accumulation since 04/21/2016.  Prescribed Furosemide 40 mg 1 tablet daily but patient said dosage was decreased to 1/2 tablet (20 mg total) daily.  Unable to locate any office epic or lab notes instructing patient to decrease Furosemide to 1/2 tablet.   He is unsure how long he has been taking 1/2 tablet.    Labs: 03/01/2016 Creatinine 1.93, BUN 45, Potassium 4.4, Sodium 139 02/19/2016 Creatinine 3.01, BUN 89, Potassium 4.9, Sodium 138  01/01/2016 Creatinine 2.05, BUN 39, Potassium 5.2, Sodium 141  11/08/2015 Creatinine 2.00, BUN 49, Potassium 5.3, Sodium 141  08/28/2015 Creatinine 1.75, BUN 26, Potassium 5.2, Sodium 141   Recommendations:    Copy of ICM check sent to Dr Tamala Julian and Dr Rayann Heman for review and will call him with any recommendations   Follow-up plan: ICM clinic phone appointment on 05/03/2016 to recheck fluid levels.   3 month ICM trend: 04/29/2016   1 Year ICM trend:      Rosalene Billings, RN 04/29/2016 10:51 AM

## 2016-04-30 NOTE — Progress Notes (Signed)
Call to patient and advised have not received any recommendations.  He reported breathing has improved and hand swelling has decreased.  Advised if he gets worse to call the the office directly tomorrow since I will be out of the office.  Will recheck fluid levels 05/03/2016.

## 2016-05-01 ENCOUNTER — Encounter: Payer: Self-pay | Admitting: Registered Nurse

## 2016-05-01 ENCOUNTER — Telehealth: Payer: Self-pay | Admitting: Registered Nurse

## 2016-05-01 ENCOUNTER — Encounter: Payer: PPO | Attending: Physical Medicine & Rehabilitation | Admitting: Registered Nurse

## 2016-05-01 VITALS — BP 147/77 | HR 63 | Resp 14

## 2016-05-01 DIAGNOSIS — F17290 Nicotine dependence, other tobacco product, uncomplicated: Secondary | ICD-10-CM | POA: Insufficient documentation

## 2016-05-01 DIAGNOSIS — G8929 Other chronic pain: Secondary | ICD-10-CM | POA: Insufficient documentation

## 2016-05-01 DIAGNOSIS — G546 Phantom limb syndrome with pain: Secondary | ICD-10-CM | POA: Insufficient documentation

## 2016-05-01 DIAGNOSIS — S78119A Complete traumatic amputation at level between unspecified hip and knee, initial encounter: Secondary | ICD-10-CM

## 2016-05-01 DIAGNOSIS — Z89619 Acquired absence of unspecified leg above knee: Secondary | ICD-10-CM

## 2016-05-01 DIAGNOSIS — Z79899 Other long term (current) drug therapy: Secondary | ICD-10-CM | POA: Diagnosis not present

## 2016-05-01 DIAGNOSIS — G894 Chronic pain syndrome: Secondary | ICD-10-CM | POA: Diagnosis not present

## 2016-05-01 DIAGNOSIS — Z89612 Acquired absence of left leg above knee: Secondary | ICD-10-CM | POA: Diagnosis not present

## 2016-05-01 DIAGNOSIS — Z5181 Encounter for therapeutic drug level monitoring: Secondary | ICD-10-CM | POA: Diagnosis not present

## 2016-05-01 DIAGNOSIS — K529 Noninfective gastroenteritis and colitis, unspecified: Secondary | ICD-10-CM | POA: Diagnosis not present

## 2016-05-01 MED ORDER — OXYCODONE-ACETAMINOPHEN 10-325 MG PO TABS: 1.0000 | ORAL_TABLET | Freq: Four times a day (QID) | ORAL | 0 refills | Status: DC | PRN

## 2016-05-01 NOTE — Telephone Encounter (Signed)
On 05/01/2016 the [place] was reviewed no conflict was seen on the [place] with multiple prescribers. [name] has a signed narcotic contract with our office. If there were any discrepancies this would have been reported to his physician.

## 2016-05-01 NOTE — Progress Notes (Signed)
Subjective:    Patient ID: [name], male    DOB: [birth year], 82 y.o.   MRN: [id number]  HPI: [name] is a 82 year old male who returns for follow up appointment for chronic pain and medication refill. He states his pain is in his left stump, he has an appointment with [facility]. He rates his pain 7. His usual exercise regime is attending  the [facility] twice a week, due to left stump pain he hasn't been able to go. He's performing some stretching exercises, he states. Arrived in his scooter.   Pain Inventory Average Pain 5 Pain Right Now 7 My pain is sharp and stabbing  In the last 24 hours, has pain interfered with the following? General activity 5 Relation with others 4 Enjoyment of life 5 What TIME of day is your pain at its worst? evening and night Sleep (in general) Poor  Pain is worse with: sitting and some activites Pain improves with: rest and medication Relief from Meds: 9  Mobility use a cane ability to climb steps?  yes do you drive?  yes  Function retired  Neuro/Psych trouble walking  Prior Studies Any changes since last visit?  no  Physicians involved in your care Any changes since last visit?  no   Family History  Problem Relation Age of Onset  . Cancer Father   . Cancer      breast/fhx  . Heart disease      fhx  . Diabetes Neg Hx    Social History   Social History  . Marital status: Widowed    Spouse name: N/A  . Number of children: N/A  . Years of education: N/A   Occupational History  . Retired Retired   Social History Main Topics  . Smoking status: Current Some Day Smoker    Types: Cigars  . Smokeless tobacco: Never Used     Comment: 1 cigar each month  . Alcohol use 0.0 oz/week     Comment: once a week wine  . Drug use: No  . Sexual activity: Not Currently   Other Topics Concern  . None   Social History Narrative   Lives in [place] with significant other,  [name].   Past Surgical History:  Procedure  Laterality Date  . ABDOMINAL ANGIOGRAM  12/25/2011   Procedure: ABDOMINAL ANGIOGRAM;  Surgeon: Serafina Mitchell, MD;  Location: [facility] CATH LAB;  Service: Cardiovascular;;  . ABDOMINAL AORTAGRAM N/A 09/11/2011   Procedure: ABDOMINAL Maxcine Ham;  Surgeon: Wellington Hampshire, MD;  Location: [place] CATH LAB;  Service: Cardiovascular;  Laterality: N/A;  . ADENOIDECTOMY     Hx; of   . AMPUTATION Left 02/04/2013   Procedure: AMPUTATION ABOVE KNEE-LEFT;  Surgeon: Mal Misty, MD;  Location: [place];  Service: Vascular;  Laterality: Left;  . BICEPS TENDON REPAIR Right 2001   Archie Endo 03/20/2001 (07/10/2012)  . BIV ICD GENERTAOR CHANGE OUT N/A 11/09/2013   Procedure: BIV ICD GENERTAOR CHANGE OUT;  Surgeon: Coralyn Mark, MD;  Location: [facility] CATH LAB;  Service: Cardiovascular;  Laterality: N/A;  . CARDIAC DEFIBRILLATOR PLACEMENT  12/26/09   pacemaker combo  . CARPAL TUNNEL RELEASE Right 2002   Archie Endo 03/20/2001 (07/10/2012)  . cervical epidural injection  2013  . COLONOSCOPY     Hx; of  . CORONARY ANGIOPLASTY    . CORONARY ANGIOPLASTY WITH STENT PLACEMENT  ~ 2000  . EMBOLECTOMY  02/07/2012   Procedure: EMBOLECTOMY;  Surgeon: Rosetta Posner, MD;  Location: [facility] OR;  Service: Vascular;  Laterality: Left;  . EMBOLECTOMY Left 12/16/2012   Procedure: THROMBECTOMY  LEFT LEG BYPASS;  Surgeon: Elam Dutch, MD;  Location: [place];  Service: Vascular;  Laterality: Left;  . FEMORAL-POPLITEAL BYPASS GRAFT Left 09/16/2012   Procedure: LEFT FEMORAL-POPLITEAL BYPASS GRAFT WITH GORTEX Propaten Graft 6x80 Thin Wall and Left lower leg Angiogram;  Surgeon: Mal Misty, MD;  Location: [place];  Service: Vascular;  Laterality: Left;  . FEMORAL-TIBIAL BYPASS GRAFT  09/25/2011   Procedure: BYPASS GRAFT FEMORAL-TIBIAL ARTERY;  Surgeon: Mal Misty, MD;  Location: [facility] OR;  Service: Vascular;  Laterality: Left;  Left Femoral - Anterior Tibial Bypass;  saphenous vein graft from left leg  . FEMORAL-TIBIAL BYPASS GRAFT  02/07/2012   Procedure:  BYPASS GRAFT FEMORAL-TIBIAL ARTERY;  Surgeon: Rosetta Posner, MD;  Location: [place];  Service: Vascular;  Laterality: Left;  Thrombectomy Left Femoral - Tibial Bypass Graft  . FEMORAL-TIBIAL BYPASS GRAFT  04/03/2012   Procedure: BYPASS GRAFT FEMORAL-TIBIAL ARTERY;  Surgeon: Mal Misty, MD;  Location: [place];  Service: Vascular;  Laterality: Left;  Redo  . FEMORAL-TIBIAL BYPASS GRAFT Left 09/30/2012   Procedure: REDO LEFT FEMORAL-ANTERIOR TIBIAL ARTERY BYPASS USING COMPOSITE CEPHALIC AND BASILIC VEIN GRAFT FROM LEFT ARM;  Surgeon: Mal Misty, MD;  Location: [place];  Service: Vascular;  Laterality: Left;  . FOOT SURGERY Right 03/20/2001   "have plates and screws in; didn't break it" (07/10/2012)  . GROIN DEBRIDEMENT Left 11/12/2012   Procedure: CLOSURE INGUINAL WOUND;  Surgeon: Mal Misty, MD;  Location: [place];  Service: Vascular;  Laterality: Left;  . I&D EXTREMITY Left 10/21/2012   Procedure: EXPLORATION AND DEBRIDEMENT OF LEFT GROIN WOUND;  Surgeon: Mal Misty, MD;  Location: [place];  Service: Vascular;  Laterality: Left;  . INSERT / REPLACE / REMOVE PACEMAKER  2007  . INTRAOPERATIVE ARTERIOGRAM  09/25/2011   Procedure: INTRA OPERATIVE ARTERIOGRAM;  Surgeon: Mal Misty, MD;  Location: [place];  Service: Vascular;  Laterality: Left;  . LEG AMPUTATION ABOVE KNEE Left 02/04/2013   DR LAWSON  . LOWER EXTREMITY ANGIOGRAM Left 12/25/2011   Procedure: LOWER EXTREMITY ANGIOGRAM;  Surgeon: Serafina Mitchell, MD;  Location: [facility] CATH LAB;  Service: Cardiovascular;  Laterality: Left;  lt leg angio co2  . LOWER EXTREMITY ANGIOGRAM N/A 07/07/2012   Procedure: LOWER EXTREMITY ANGIOGRAM;  Surgeon: Conrad Sabana, MD;  Location: [facility] CATH LAB;  Service: Cardiovascular;  Laterality: N/A;  . LOWER EXTREMITY ANGIOGRAM N/A 09/15/2012   Procedure: LOWER EXTREMITY ANGIOGRAM;  Surgeon: Serafina Mitchell, MD;  Location: [facility] CATH LAB;  Service: Cardiovascular;  Laterality: N/A;  . PATCH ANGIOPLASTY Left 10/21/2012   Procedure:  PATCH ANGIOPLASTY;  Surgeon: Mal Misty, MD;  Location: [place];  Service: Vascular;  Laterality: Left;  . PERIPHERAL VASCULAR CATHETERIZATION N/A 11/08/2015   Procedure: Abdominal Aortogram w/Lower Extremity;  Surgeon: Waynetta Sandy, MD;  Location: [place] CV LAB;  Service: Cardiovascular;  Laterality: N/A;  . REMOVAL OF GRAFT Left 04/16/2013   Procedure: I & D LEFT AKA WOUND, POSSIBLE REMOVAL OF INFECTED GORTEX GRAFT;  Surgeon: Mal Misty, MD;  Location: [place];  Service: Vascular;  Laterality: Left;  . RENAL ARTERY STENT  2009  . SHOULDER OPEN ROTATOR CUFF REPAIR Right 2001   repair of lacerated right/notes 10/11/1999  (07/10/2012)  . TONSILLECTOMY    . TURP VAPORIZATION    . UPPER EXTREMITY ANGIOGRAM  09/15/2012  Procedure: UPPER EXTREMITY ANGIOGRAM;  Surgeon: Serafina Mitchell, MD;  Location: [facility] CATH LAB;  Service: Cardiovascular;;  . UVULOPALATOPHARYNGOPLASTY (UPPP)/TONSILLECTOMY/SEPTOPLASTY  06/30/2003   Archie Endo 06/30/2003 (07/10/2012)   Past Medical History:  Diagnosis Date  . Arthritis   . Atrial fibrillation ([place])   . Benign prostatic hypertrophy   . CAD (coronary artery disease)    a. s/p mid LAD stenting with DES 2008 Dr. Daneen Schick  . Chronic systolic dysfunction of left ventricle    a. mixed ischemic and nonischemic CM,  EF 35%. b. s/p AICD implantation.  . Constipation   . Depression   . ED (erectile dysfunction)   . Gout   . History of MRSA infection   . Hypertension    takes Carvedilol and Losartan daily  . Insomnia   . PAD (peripheral artery disease) (HCC)    s/p multiple LLE bypass grafts; left mid-distal SCA occlusion by 08/2012 duplex  . Renal artery stenosis ([place])    s/p stenting 2009  . Sleep apnea    hx of "had surgery for"  . Type II diabetes mellitus (HCC)    BP (!) 147/77   Pulse 63   Resp 14   SpO2 97%   Opioid Risk Score:   Fall Risk Score:  `1  Depression screen PHQ 2/9  Depression screen [facility] 2/9 05/01/2016 01/02/2016 07/10/2015  05/09/2015 11/23/2014 06/03/2014  Decreased Interest 1 1 0 2 2 2   Down, Depressed, Hopeless 0 0 0 1 1 1   PHQ - 2 Score 1 1 0 3 3 3   Altered sleeping - - - - - 3  Tired, decreased energy - - - - - 2  Change in appetite - - - - - 0  Feeling bad or failure about yourself  - - - - - 0  Trouble concentrating - - - - - 1  Moving slowly or fidgety/restless - - - - - 0  Suicidal thoughts - - - - - 0  PHQ-9 Score - - - - - 9  Some recent data might be hidden    Review of Systems  HENT: Negative.   Eyes: Negative.   Respiratory: Negative.   Cardiovascular: Positive for leg swelling.  Gastrointestinal: Negative.   Endocrine: Negative.   Genitourinary: Negative.   Musculoskeletal: Negative.   Skin: Negative.        Sore stump  Allergic/Immunologic: Negative.   Neurological: Negative.   Hematological: Negative.   Psychiatric/Behavioral: Negative.   All other systems reviewed and are negative.      Objective:   Physical Exam  Constitutional: He is oriented to person, place, and time. He appears well-developed and well-nourished.  HENT:  Head: Normocephalic and atraumatic.  Neck: Normal range of motion. Neck supple.  Cardiovascular: Normal rate and regular rhythm.   Pulmonary/Chest: Effort normal and breath sounds normal.  Musculoskeletal:  Normal Muscle Bulk and Muscle Testing Reveals: Upper Extremities: Full ROM and Muscle Strength 5/5 Lower Extremities: Right: Full ROM and Muscle Strength 5/5 Left: AKA Arrived in scooter  Neurological: He is alert and oriented to person, place, and time.  Skin: Skin is warm and dry.  Psychiatric: He has a normal mood and affect.  Nursing note and vitals reviewed.         Assessment & Plan:  1. Functional deficits secondary to left AKA.[place] with Exercise Regime: 05/01/2016 2. Phantom limb pain: Continue Gabapentin. 05/01/2016 Refilled: Percocet 10/325 mg percocet one tablet every 6 hours as needed for breakthrough pain. #90.  We will  continue the opioid monitoring program, this consists of regular clinic visits, examinations, urine drug screen, pill counts as well as use of [place] Controlled Substance Reporting System.   15 minutes of face to face patient care time was spent during this visit. All questions were encouraged and answered.  F/U in 1 month

## 2016-05-03 ENCOUNTER — Ambulatory Visit: Payer: PPO | Admitting: Vascular Surgery

## 2016-05-03 ENCOUNTER — Encounter: Payer: Self-pay | Admitting: Vascular Surgery

## 2016-05-03 ENCOUNTER — Ambulatory Visit (INDEPENDENT_AMBULATORY_CARE_PROVIDER_SITE_OTHER): Payer: PPO

## 2016-05-03 DIAGNOSIS — Z95810 Presence of automatic (implantable) cardiac defibrillator: Secondary | ICD-10-CM

## 2016-05-03 DIAGNOSIS — I5022 Chronic systolic (congestive) heart failure: Secondary | ICD-10-CM

## 2016-05-03 NOTE — Progress Notes (Signed)
EPIC Encounter for ICM Monitoring  Patient Name: [name] is a 82 y.o. male Date: 05/03/2016 Primary Care Physican: [name], MD Primary Cardiologist:Smith Electrophysiologist: Allred Dry Weight:Unknown - amputee Bi-V Pacing: 99.8% Time in AT/AF 24.0 hr/day (100.0%)                                                 Heart Failure questions reviewed, pt said hand swelling has resolved and breathing is better except he has a cold.  He said he has some chest congestion and advised to go to PCP if chest cold worsens.    Thoracic impedance abnormal suggesting fluid accumulation since 04/21/2016.  Prescribed Furosemide 40 mg 1 tablet daily but patient said dosage was decreased to 1/2 tablet (20 mg total) daily.  Unable to locate any office epic or lab notes instructing patient to decrease Furosemide to 1/2 tablet.   He is unsure how long he has been taking 1/2 tablet.    Labs: 03/01/2016 Creatinine 1.93, BUN 45, Potassium 4.4, Sodium 139 02/19/2016 Creatinine 3.01, BUN 89, Potassium 4.9, Sodium 138  01/01/2016 Creatinine 2.05, BUN 39, Potassium 5.2, Sodium 141  11/08/2015 Creatinine 2.00, BUN 49, Potassium 5.3, Sodium 141  08/28/2015 Creatinine 1.75, BUN 26, Potassium 5.2, Sodium 141   Recommendations: No changes. Reminded to limit dietary salt intake to 2000 mg/day and fluid intake to < 2 liters/day. Encouraged to call for fluid symptoms.  Follow-up plan: ICM clinic phone appointment on 05/16/2016.  Copy of ICM check sent to primary cardiologist and device physician.   3 month ICM trend: 05/03/2016   1 Year ICM trend:      Rosalene Billings, RN 05/03/2016 3:21 PM

## 2016-05-10 ENCOUNTER — Ambulatory Visit (INDEPENDENT_AMBULATORY_CARE_PROVIDER_SITE_OTHER): Payer: PPO | Admitting: Vascular Surgery

## 2016-05-10 ENCOUNTER — Encounter: Payer: Self-pay | Admitting: Vascular Surgery

## 2016-05-10 VITALS — BP 129/65 | HR 60 | Temp 97.2°F | Resp 16 | Ht 68.0 in | Wt 170.0 lb

## 2016-05-10 DIAGNOSIS — I779 Disorder of arteries and arterioles, unspecified: Secondary | ICD-10-CM

## 2016-05-10 NOTE — Progress Notes (Signed)
Patient ID: [name], male   DOB: [birth year], 82 y.o.   MRN: [id number]  Reason for Consult: PAD (left upper thigh soreness)   Referred by Laurey Morale, MD  Subjective:     HPI:  [name] is a 82 y.o. male no neurovascular from previous left above-knee amputation as well as right revascularization of her lower extremity with peroneal runoff that was for a wound. He is now mostly healed his right great toe ulceration and satisfied with this. His chief complaint today is left above-knee residual limb pain that occurs in placing his prosthetic. He is concerned because this prevents him from being as active as he would like. He has not had any wounds on his left residual limb he does not notice any change with activity other than just pain with constriction. He is always a quite frustrated by this. He is now nonsmoker he does take aspirin and coumadin for afib.  Past Medical History:  Diagnosis Date  . Arthritis   . Atrial fibrillation ([place])   . Benign prostatic hypertrophy   . CAD (coronary artery disease)    a. s/p mid LAD stenting with DES 2008 Dr. Daneen Schick  . Chronic systolic dysfunction of left ventricle    a. mixed ischemic and nonischemic CM,  EF 35%. b. s/p AICD implantation.  . Constipation   . Depression   . ED (erectile dysfunction)   . Gout   . History of MRSA infection   . Hypertension    takes Carvedilol and Losartan daily  . Insomnia   . PAD (peripheral artery disease) (HCC)    s/p multiple LLE bypass grafts; left mid-distal SCA occlusion by 08/2012 duplex  . Renal artery stenosis ([place])    s/p stenting 2009  . Sleep apnea    hx of "had surgery for"  . Type II diabetes mellitus (HCC)    Family History  Problem Relation Age of Onset  . Cancer Father   . Cancer      breast/fhx  . Heart disease      fhx  . Diabetes Neg Hx    Past Surgical History:  Procedure Laterality Date  . ABDOMINAL ANGIOGRAM  12/25/2011   Procedure: ABDOMINAL  ANGIOGRAM;  Surgeon: Jonathan Mitchell, MD;  Location: [facility] CATH LAB;  Service: Cardiovascular;;  . ABDOMINAL AORTAGRAM N/A 09/11/2011   Procedure: ABDOMINAL Maxcine Ham;  Surgeon: Jonathan Hampshire, MD;  Location: [place] CATH LAB;  Service: Cardiovascular;  Laterality: N/A;  . ADENOIDECTOMY     Hx; of   . AMPUTATION Left 02/04/2013   Procedure: AMPUTATION ABOVE KNEE-LEFT;  Surgeon: Campos Misty, MD;  Location: [place];  Service: Vascular;  Laterality: Left;  . BICEPS TENDON REPAIR Right 2001   Archie Endo 03/20/2001 (07/10/2012)  . BIV ICD GENERTAOR CHANGE OUT N/A 11/09/2013   Procedure: BIV ICD GENERTAOR CHANGE OUT;  Surgeon: Jonathan Mark, MD;  Location: [facility] CATH LAB;  Service: Cardiovascular;  Laterality: N/A;  . CARDIAC DEFIBRILLATOR PLACEMENT  12/26/09   pacemaker combo  . CARPAL TUNNEL RELEASE Right 2002   Archie Endo 03/20/2001 (07/10/2012)  . cervical epidural injection  2013  . COLONOSCOPY     Hx; of  . CORONARY ANGIOPLASTY    . CORONARY ANGIOPLASTY WITH STENT PLACEMENT  ~ 2000  . EMBOLECTOMY  02/07/2012   Procedure: EMBOLECTOMY;  Surgeon: Jonathan Posner, MD;  Location: [place];  Service: Vascular;  Laterality: Left;  . EMBOLECTOMY Left 12/16/2012   Procedure: THROMBECTOMY  LEFT LEG BYPASS;  Surgeon: Jonathan Dutch, MD;  Location: [place];  Service: Vascular;  Laterality: Left;  . FEMORAL-POPLITEAL BYPASS GRAFT Left 09/16/2012   Procedure: LEFT FEMORAL-POPLITEAL BYPASS GRAFT WITH GORTEX Propaten Graft 6x80 Thin Wall and Left lower leg Angiogram;  Surgeon: Campos Misty, MD;  Location: [place];  Service: Vascular;  Laterality: Left;  . FEMORAL-TIBIAL BYPASS GRAFT  09/25/2011   Procedure: BYPASS GRAFT FEMORAL-TIBIAL ARTERY;  Surgeon: Campos Misty, MD;  Location: [facility] OR;  Service: Vascular;  Laterality: Left;  Left Femoral - Anterior Tibial Bypass;  saphenous vein graft from left leg  . FEMORAL-TIBIAL BYPASS GRAFT  02/07/2012   Procedure: BYPASS GRAFT FEMORAL-TIBIAL ARTERY;  Surgeon: Jonathan Posner, MD;  Location: [place];   Service: Vascular;  Laterality: Left;  Thrombectomy Left Femoral - Tibial Bypass Graft  . FEMORAL-TIBIAL BYPASS GRAFT  04/03/2012   Procedure: BYPASS GRAFT FEMORAL-TIBIAL ARTERY;  Surgeon: Campos Misty, MD;  Location: [place];  Service: Vascular;  Laterality: Left;  Redo  . FEMORAL-TIBIAL BYPASS GRAFT Left 09/30/2012   Procedure: REDO LEFT FEMORAL-ANTERIOR TIBIAL ARTERY BYPASS USING COMPOSITE CEPHALIC AND BASILIC VEIN GRAFT FROM LEFT ARM;  Surgeon: Campos Misty, MD;  Location: [place];  Service: Vascular;  Laterality: Left;  . FOOT SURGERY Right 03/20/2001   "have plates and screws in; didn't break it" (07/10/2012)  . GROIN DEBRIDEMENT Left 11/12/2012   Procedure: CLOSURE INGUINAL WOUND;  Surgeon: Campos Misty, MD;  Location: [place];  Service: Vascular;  Laterality: Left;  . I&D EXTREMITY Left 10/21/2012   Procedure: EXPLORATION AND DEBRIDEMENT OF LEFT GROIN WOUND;  Surgeon: Campos Misty, MD;  Location: [place];  Service: Vascular;  Laterality: Left;  . INSERT / REPLACE / REMOVE PACEMAKER  2007  . INTRAOPERATIVE ARTERIOGRAM  09/25/2011   Procedure: INTRA OPERATIVE ARTERIOGRAM;  Surgeon: Campos Misty, MD;  Location: [place];  Service: Vascular;  Laterality: Left;  . LEG AMPUTATION ABOVE KNEE Left 02/04/2013   DR [name]  . LOWER EXTREMITY ANGIOGRAM Left 12/25/2011   Procedure: LOWER EXTREMITY ANGIOGRAM;  Surgeon: Jonathan Mitchell, MD;  Location: [facility] CATH LAB;  Service: Cardiovascular;  Laterality: Left;  lt leg angio co2  . LOWER EXTREMITY ANGIOGRAM N/A 07/07/2012   Procedure: LOWER EXTREMITY ANGIOGRAM;  Surgeon: Jonathan Circle, MD;  Location: [facility] CATH LAB;  Service: Cardiovascular;  Laterality: N/A;  . LOWER EXTREMITY ANGIOGRAM N/A 09/15/2012   Procedure: LOWER EXTREMITY ANGIOGRAM;  Surgeon: Jonathan Mitchell, MD;  Location: [facility] CATH LAB;  Service: Cardiovascular;  Laterality: N/A;  . PATCH ANGIOPLASTY Left 10/21/2012   Procedure: PATCH ANGIOPLASTY;  Surgeon: Campos Misty, MD;  Location: [place];  Service:  Vascular;  Laterality: Left;  . PERIPHERAL VASCULAR CATHETERIZATION N/A 11/08/2015   Procedure: Abdominal Aortogram w/Lower Extremity;  Surgeon: Waynetta Sandy, MD;  Location: [place] CV LAB;  Service: Cardiovascular;  Laterality: N/A;  . REMOVAL OF GRAFT Left 04/16/2013   Procedure: I & D LEFT AKA WOUND, POSSIBLE REMOVAL OF INFECTED GORTEX GRAFT;  Surgeon: Campos Misty, MD;  Location: [place];  Service: Vascular;  Laterality: Left;  . RENAL ARTERY STENT  2009  . SHOULDER OPEN ROTATOR CUFF REPAIR Right 2001   repair of lacerated right/notes 10/11/1999  (07/10/2012)  . TONSILLECTOMY    . TURP VAPORIZATION    . UPPER EXTREMITY ANGIOGRAM  09/15/2012   Procedure: UPPER EXTREMITY ANGIOGRAM;  Surgeon: Jonathan Mitchell, MD;  Location: [facility] CATH LAB;  Service:  Cardiovascular;;  . UVULOPALATOPHARYNGOPLASTY (UPPP)/TONSILLECTOMY/SEPTOPLASTY  06/30/2003   Archie Endo 06/30/2003 (07/10/2012)    Short Social History:  Social History  Substance Use Topics  . Smoking status: Current Some Day Smoker    Types: Cigars  . Smokeless tobacco: Never Used     Comment: 1 cigar each month  . Alcohol use 0.0 oz/week     Comment: once a week wine    Allergies  Allergen Reactions  . Other Other (See Comments)    Plastic tape tears skin  . Bactrim Ds [Sulfamethoxazole-Trimethoprim] Other (See Comments)    Hand tremors    Current Outpatient Prescriptions  Medication Sig Dispense Refill  . allopurinol (ZYLOPRIM) 100 MG tablet Take 1 tablet (100 mg total) by mouth daily as needed (Gout). 90 tablet 3  . amLODipine (NORVASC) 5 MG tablet Take 1 tablet (5 mg total) by mouth daily. 90 tablet 3  . aspirin EC 81 MG tablet Take 81 mg by mouth daily.    . carvedilol (COREG) 25 MG tablet Take 1 tablet (25 mg total) by mouth 2 (two) times daily with a meal. 180 tablet 3  . finasteride (PROSCAR) 5 MG tablet Take 1 tablet (5 mg total) by mouth daily. 90 tablet 3  . furosemide (LASIX) 40 MG tablet Take 1 tablet (40 mg total)  by mouth daily. 90 tablet 3  . gabapentin (NEURONTIN) 300 MG capsule Take 2 capsules (600 mg total) by mouth 3 (three) times daily. 540 capsule 3  . insulin aspart protamine- aspart (NOVOLOG MIX 70/30) (70-30) 100 UNIT/ML injection Inject 7 Units into the skin 2 (two) times daily with a meal.    . Insulin Pen Needle 31G X 8 MM MISC Use 2-3 times per day and diagnosis code is 250.00 100 each 1  . losartan (COZAAR) 100 MG tablet Take 1 tablet (100 mg total) by mouth daily. 90 tablet 3  . methylPREDNISolone (MEDROL DOSEPAK) 4 MG TBPK tablet Take as directed 21 tablet 0  . nitroGLYCERIN (NITROSTAT) 0.4 MG SL tablet Place 1 tablet (0.4 mg total) under the tongue every 5 (five) minutes as needed for chest pain. For chest pain, max 3 doses 90 tablet 3  . ONE TOUCH ULTRA TEST test strip USE TO TEST BLOOD SUGAR TWO TIMES DAILY AS INSTRUCTED BY PHYSICIAN. 100 each 4  . warfarin (COUMADIN) 5 MG tablet TAKE AS DIRECTED BY ANTICOAGULATION CLINIC 60 tablet 1  . oxyCODONE-acetaminophen (PERCOCET) 10-325 MG tablet Take 1 tablet by mouth every 6 (six) hours as needed for pain. (Patient not taking: Reported on 05/10/2016) 90 tablet 0   No current facility-administered medications for this visit.     Review of Systems  Constitutional:  Constitutional negative. HENT: HENT negative.  Eyes: Eyes negative.  Respiratory: Respiratory negative.  Cardiovascular: Cardiovascular negative.  GI: Gastrointestinal negative.  Musculoskeletal:       Pain in left residual limb Skin: Skin negative.  Neurological: Neurological negative. Hematologic: Hematologic/lymphatic negative.  Psychiatric: Psychiatric negative.        Objective:  Objective   Vitals:   05/10/16 1158  BP: 129/65  Pulse: 60  Resp: 16  Temp: 97.2 F (36.2 C)  SpO2: 97%  Weight: 170 lb (77.1 kg)  Height: 5\' 8"  (1.727 m)   Body mass index is 25.85 kg/m.  Physical Exam  Constitutional: He is oriented to person, place, and time. He appears  well-developed.  HENT:  Head: Normocephalic.  Neck: Normal range of motion.  Cardiovascular: Normal rate.   Pulses:  Femoral pulses are 2+ on the left side. Multiphasic peroneal signal on right  Pulmonary/Chest: Effort normal.  Abdominal: Soft. He exhibits no mass.  Musculoskeletal:  Left aka is well healed, no external abnormalities noted  Lymphadenopathy:    He has no cervical adenopathy.  Neurological: He is alert and oriented to person, place, and time.  Skin:  Right foot ulceration nearly healed on great toe  Psychiatric: He has a normal mood and affect. His behavior is normal. Judgment and thought content normal.    Data: Aortoiliac artery duplex demonstrates increased velocity in the right common iliac artery suggestive of 50-99% stenosis.     Assessment/Plan:     82 year old male 1 endovascular here with left above-knee amputation residual pain with wearing his prosthetic. He does have a palpable left femoral pulse there there is no outward appearance of abnormality. Happily his right great toe ulceration is healing after his peroneal artery revascularization. 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 We will follow him up in 6 months with repeat ABIs.     Waynetta Sandy MD [facility]

## 2016-05-13 ENCOUNTER — Encounter (HOSPITAL_COMMUNITY): Payer: PPO

## 2016-05-13 ENCOUNTER — Ambulatory Visit: Payer: PPO | Admitting: Family

## 2016-05-13 ENCOUNTER — Ambulatory Visit (INDEPENDENT_AMBULATORY_CARE_PROVIDER_SITE_OTHER): Payer: PPO | Admitting: General Practice

## 2016-05-13 DIAGNOSIS — I482 Chronic atrial fibrillation, unspecified: Secondary | ICD-10-CM

## 2016-05-13 DIAGNOSIS — Z5181 Encounter for therapeutic drug level monitoring: Secondary | ICD-10-CM | POA: Diagnosis not present

## 2016-05-13 LAB — POCT INR: INR: 2.7

## 2016-05-13 NOTE — Patient Instructions (Signed)
Pre visit review using our clinic review tool, if applicable. No additional management support is needed unless otherwise documented below in the visit note. 

## 2016-05-16 ENCOUNTER — Ambulatory Visit (INDEPENDENT_AMBULATORY_CARE_PROVIDER_SITE_OTHER): Payer: PPO

## 2016-05-16 DIAGNOSIS — I5022 Chronic systolic (congestive) heart failure: Secondary | ICD-10-CM

## 2016-05-16 DIAGNOSIS — Z95810 Presence of automatic (implantable) cardiac defibrillator: Secondary | ICD-10-CM

## 2016-05-16 NOTE — Addendum Note (Signed)
Addended by: Lianne Cure A on: 05/16/2016 09:56 AM   Modules accepted: Orders

## 2016-05-16 NOTE — Progress Notes (Signed)
EPIC Encounter for ICM Monitoring  Patient Name: [name] is a 82 y.o. male Date: 05/16/2016 Primary Care Physican: [name], MD Primary Cardiologist:Smith Electrophysiologist: Allred Dry Weight:Unknown - amputee Bi-V Pacing: 99.7%      Heart Failure questions reviewed, pt asymptomatic.   Thoracic impedance returned to normal.  Prescribed Furosemide 40 mg 1 tablet daily but patient said dosage was decreased to 1/2 tablet (20 mg total) daily. Unable to locate any office epic or lab notes instructing patient to decrease Furosemide to 1/2 tablet. He is unsure how long he has been taking 1/2 tablet.   Labs: 12/29/2017Creatinine 1.93, BUN 45, Potassium 4.4, Sodium 139 02/19/2016 Creatinine 3.01, BUN 89, Potassium 4.9, Sodium 138  01/01/2016 Creatinine 2.05, BUN 39, Potassium 5.2, Sodium 141  11/08/2015 Creatinine 2.00, BUN 49, Potassium 5.3, Sodium 141  08/28/2015 Creatinine 1.75, BUN 26, Potassium 5.2, Sodium 141   Recommendations: No changes. Reminded to limit dietary salt intake to 2000 mg/day and fluid intake to < 2 liters/day. Encouraged to call for fluid symptoms.  Follow-up plan: ICM clinic phone appointment on 06/04/2016.  Copy of ICM check sent to device physician.   3 month ICM trend: 05/16/2016   1 Year ICM trend:      Rosalene Billings, RN 05/16/2016 3:37 PM

## 2016-05-24 ENCOUNTER — Ambulatory Visit: Payer: PPO | Admitting: Registered Nurse

## 2016-05-24 ENCOUNTER — Encounter: Payer: Self-pay | Admitting: Registered Nurse

## 2016-05-24 ENCOUNTER — Encounter: Payer: PPO | Attending: Physical Medicine & Rehabilitation | Admitting: Registered Nurse

## 2016-05-24 VITALS — BP 138/97 | HR 61 | Resp 14

## 2016-05-24 DIAGNOSIS — K529 Noninfective gastroenteritis and colitis, unspecified: Secondary | ICD-10-CM | POA: Insufficient documentation

## 2016-05-24 DIAGNOSIS — Z89619 Acquired absence of unspecified leg above knee: Secondary | ICD-10-CM | POA: Diagnosis not present

## 2016-05-24 DIAGNOSIS — S78119A Complete traumatic amputation at level between unspecified hip and knee, initial encounter: Secondary | ICD-10-CM

## 2016-05-24 DIAGNOSIS — G894 Chronic pain syndrome: Secondary | ICD-10-CM

## 2016-05-24 DIAGNOSIS — Z5181 Encounter for therapeutic drug level monitoring: Secondary | ICD-10-CM | POA: Diagnosis not present

## 2016-05-24 DIAGNOSIS — Z89612 Acquired absence of left leg above knee: Secondary | ICD-10-CM | POA: Diagnosis not present

## 2016-05-24 DIAGNOSIS — G8929 Other chronic pain: Secondary | ICD-10-CM | POA: Diagnosis not present

## 2016-05-24 DIAGNOSIS — Z79899 Other long term (current) drug therapy: Secondary | ICD-10-CM | POA: Diagnosis not present

## 2016-05-24 DIAGNOSIS — G546 Phantom limb syndrome with pain: Secondary | ICD-10-CM | POA: Diagnosis not present

## 2016-05-24 DIAGNOSIS — F17290 Nicotine dependence, other tobacco product, uncomplicated: Secondary | ICD-10-CM | POA: Insufficient documentation

## 2016-05-24 MED ORDER — OXYCODONE-ACETAMINOPHEN 10-325 MG PO TABS: 1.0000 | ORAL_TABLET | Freq: Three times a day (TID) | ORAL | 0 refills | Status: DC | PRN

## 2016-05-24 NOTE — Progress Notes (Signed)
Subjective:    Patient ID: [name], male    DOB: May 24, 1934, 82 y.o.   MRN: [id number]  HPI:  [name] is a 82 year old male who returns for follow up appointment for chronic pain and medication refill. He states his pain is in his left stump, having phantom pains.He rates his pain 7. His current exercise regime ierforming some stretching exercises. Arrived in his scooter.   Pain Inventory Average Pain 7 Pain Right Now 7 My pain is intermittent  In the last 24 hours, has pain interfered with the following? General activity 0 Relation with others 0 Enjoyment of life 0 What TIME of day is your pain at its worst? n/a Sleep (in general) Poor  Pain is worse with: n/a Pain improves with: n/a Relief from Meds: n/a  Mobility use a wheelchair  Function retired  Neuro/Psych tingling trouble walking  Prior Studies Any changes since last visit?  no  Physicians involved in your care Any changes since last visit?  no   Family History  Problem Relation Age of Onset  . Cancer Father   . Cancer      breast/fhx  . Heart disease      fhx  . Diabetes Neg Hx    Social History   Social History  . Marital status: Widowed    Spouse name: N/A  . Number of children: N/A  . Years of education: N/A   Occupational History  . Retired Retired   Social History Main Topics  . Smoking status: Current Some Day Smoker    Types: Cigars  . Smokeless tobacco: Never Used     Comment: 1 cigar each month  . Alcohol use 0.0 oz/week     Comment: once a week wine  . Drug use: No  . Sexual activity: Not Currently   Other Topics Concern  . None   Social History Narrative   Lives in [place] with significant other,  [name].   Past Surgical History:  Procedure Laterality Date  . ABDOMINAL ANGIOGRAM  12/25/2011   Procedure: ABDOMINAL ANGIOGRAM;  Surgeon: Serafina Mitchell, MD;  [facility];  Service: Cardiovascular;;  . ABDOMINAL AORTAGRAM N/A  09/11/2011   Procedure: ABDOMINAL Maxcine Ham;  Surgeon: Wellington Hampshire, MD;  Location: [place] CATH LAB;  Service: Cardiovascular;  Laterality: N/A;  . ADENOIDECTOMY     Hx; of   . AMPUTATION Left 02/04/2013   Procedure: AMPUTATION ABOVE KNEE-LEFT;  Surgeon: Mal Misty, MD;  Location: [place];  Service: Vascular;  Laterality: Left;  . BICEPS TENDON REPAIR Right 2001   Archie Endo 03/20/2001 (07/10/2012)  . BIV ICD GENERTAOR CHANGE OUT N/A 11/09/2013   Procedure: BIV ICD GENERTAOR CHANGE OUT;  Surgeon: Coralyn Mark, MD;  Location: [facility] CATH LAB;  Service: Cardiovascular;  Laterality: N/A;  . CARDIAC DEFIBRILLATOR PLACEMENT  12/26/09   pacemaker combo  . CARPAL TUNNEL RELEASE Right 2002   Archie Endo 03/20/2001 (07/10/2012)  . cervical epidural injection  2013  . COLONOSCOPY     Hx; of  . CORONARY ANGIOPLASTY    . CORONARY ANGIOPLASTY WITH STENT PLACEMENT  ~ 2000  . EMBOLECTOMY  02/07/2012   Procedure: EMBOLECTOMY;  Surgeon: Rosetta Posner, MD;  Location: [place];  Service: Vascular;  Laterality: Left;  . EMBOLECTOMY Left 12/16/2012   Procedure: THROMBECTOMY  LEFT LEG BYPASS;  Surgeon: Elam Dutch, MD;  Location: [place];  Service: Vascular;  Laterality: Left;  . FEMORAL-POPLITEAL BYPASS GRAFT  Left 09/16/2012   Procedure: LEFT FEMORAL-POPLITEAL BYPASS GRAFT WITH GORTEX Propaten Graft 6x80 Thin Wall and Left lower leg Angiogram;  Surgeon: Mal Misty, MD;  Location: [place];  Service: Vascular;  Laterality: Left;  . FEMORAL-TIBIAL BYPASS GRAFT  09/25/2011   Procedure: BYPASS GRAFT FEMORAL-TIBIAL ARTERY;  Surgeon: Mal Misty, MD;  Location: [facility] OR;  Service: Vascular;  Laterality: Left;  Left Femoral - Anterior Tibial Bypass;  saphenous vein graft from left leg  . FEMORAL-TIBIAL BYPASS GRAFT  02/07/2012   Procedure: BYPASS GRAFT FEMORAL-TIBIAL ARTERY;  Surgeon: Rosetta Posner, MD;  Location: [place];  Service: Vascular;  Laterality: Left;  Thrombectomy Left Femoral - Tibial Bypass Graft  . FEMORAL-TIBIAL BYPASS GRAFT   04/03/2012   Procedure: BYPASS GRAFT FEMORAL-TIBIAL ARTERY;  Surgeon: Mal Misty, MD;  Location: [place];  Service: Vascular;  Laterality: Left;  Redo  . FEMORAL-TIBIAL BYPASS GRAFT Left 09/30/2012   Procedure: REDO LEFT FEMORAL-ANTERIOR TIBIAL ARTERY BYPASS USING COMPOSITE CEPHALIC AND BASILIC VEIN GRAFT FROM LEFT ARM;  Surgeon: Mal Misty, MD;  Location: [place];  Service: Vascular;  Laterality: Left;  . FOOT SURGERY Right 03/20/2001   "have plates and screws in; didn't break it" (07/10/2012)  . GROIN DEBRIDEMENT Left 11/12/2012   Procedure: CLOSURE INGUINAL WOUND;  Surgeon: Mal Misty, MD;  Location: [place];  Service: Vascular;  Laterality: Left;  . I&D EXTREMITY Left 10/21/2012   Procedure: EXPLORATION AND DEBRIDEMENT OF LEFT GROIN WOUND;  Surgeon: Mal Misty, MD;  Location: [place];  Service: Vascular;  Laterality: Left;  . INSERT / REPLACE / REMOVE PACEMAKER  2007  . INTRAOPERATIVE ARTERIOGRAM  09/25/2011   Procedure: INTRA OPERATIVE ARTERIOGRAM;  Surgeon: Mal Misty, MD;  Location: [place];  Service: Vascular;  Laterality: Left;  . LEG AMPUTATION ABOVE KNEE Left 02/04/2013   DR LAWSON  . LOWER EXTREMITY ANGIOGRAM Left 12/25/2011   Procedure: LOWER EXTREMITY ANGIOGRAM;  Surgeon: Serafina Mitchell, MD;  Location: [facility] CATH LAB;  Service: Cardiovascular;  Laterality: Left;  lt leg angio co2  . LOWER EXTREMITY ANGIOGRAM N/A 07/07/2012   Procedure: LOWER EXTREMITY ANGIOGRAM;  Surgeon: Conrad Advance, MD;  Location: [facility] CATH LAB;  Service: Cardiovascular;  Laterality: N/A;  . LOWER EXTREMITY ANGIOGRAM N/A 09/15/2012   Procedure: LOWER EXTREMITY ANGIOGRAM;  Surgeon: Serafina Mitchell, MD;  Location: [facility] CATH LAB;  Service: Cardiovascular;  Laterality: N/A;  . PATCH ANGIOPLASTY Left 10/21/2012   Procedure: PATCH ANGIOPLASTY;  Surgeon: Mal Misty, MD;  Location: [place];  Service: Vascular;  Laterality: Left;  . PERIPHERAL VASCULAR CATHETERIZATION N/A 11/08/2015   Procedure: Abdominal Aortogram w/Lower  Extremity;  Surgeon: Waynetta Sandy, MD;  Location: [place] CV LAB;  Service: Cardiovascular;  Laterality: N/A;  . REMOVAL OF GRAFT Left 04/16/2013   Procedure: I & D LEFT AKA WOUND, POSSIBLE REMOVAL OF INFECTED GORTEX GRAFT;  Surgeon: Mal Misty, MD;  Location: [place];  Service: Vascular;  Laterality: Left;  . RENAL ARTERY STENT  2009  . SHOULDER OPEN ROTATOR CUFF REPAIR Right 2001   repair of lacerated right/notes 10/11/1999  (07/10/2012)  . TONSILLECTOMY    . TURP VAPORIZATION    . UPPER EXTREMITY ANGIOGRAM  09/15/2012   Procedure: UPPER EXTREMITY ANGIOGRAM;  Surgeon: Serafina Mitchell, MD;  Location: [facility] CATH LAB;  Service: Cardiovascular;;  . UVULOPALATOPHARYNGOPLASTY (UPPP)/TONSILLECTOMY/SEPTOPLASTY  06/30/2003   Archie Endo 06/30/2003 (07/10/2012)   Past Medical History:  Diagnosis Date  . Arthritis   .  Atrial fibrillation ([place])   . Benign prostatic hypertrophy   . CAD (coronary artery disease)    a. s/p mid LAD stenting with DES 2008 Dr. Daneen Schick  . Chronic systolic dysfunction of left ventricle    a. mixed ischemic and nonischemic CM,  EF 35%. b. s/p AICD implantation.  . Constipation   . Depression   . ED (erectile dysfunction)   . Gout   . History of MRSA infection   . Hypertension    takes Carvedilol and Losartan daily  . Insomnia   . PAD (peripheral artery disease) (H[facility])    s/p multiple LLE bypass grafts; left mid-distal SCA occlusion by 08/2012 duplex  . Renal artery stenosis ([place])    s/p stenting 2009  . Sleep apnea    hx of "had surgery for"  . Type II diabetes mellitus (HCC)    BP (!) 138/97   Pulse 61   Resp 14   SpO2 98%   Opioid Risk Score:   Fall Risk Score:  `1  Depression screen PHQ 2/9  Depression screen [facility] 2/9 05/01/2016 01/02/2016 07/10/2015 05/09/2015 11/23/2014 06/03/2014  Decreased Interest 1 1 0 2 2 2   Down, Depressed, Hopeless 0 0 0 1 1 1   PHQ - 2 Score 1 1 0 3 3 3   Altered sleeping - - - - - 3  Tired, decreased energy - - - - - 2  Change  in appetite - - - - - 0  Feeling bad or failure about yourself  - - - - - 0  Trouble concentrating - - - - - 1  Moving slowly or fidgety/restless - - - - - 0  Suicidal thoughts - - - - - 0  PHQ-9 Score - - - - - 9  Some recent data might be hidden     Review of Systems  Constitutional: Negative.   HENT: Negative.   Eyes: Negative.   Respiratory: Negative.   Cardiovascular: Negative.   Gastrointestinal: Negative.   Endocrine: Negative.   Genitourinary: Negative.   Musculoskeletal: Negative.   Skin: Negative.   Allergic/Immunologic: Negative.   Neurological: Negative.   Hematological: Negative.   Psychiatric/Behavioral: Negative.   All other systems reviewed and are negative.      Objective:   Physical Exam  Constitutional: He is oriented to person, place, and time. He appears well-developed and well-nourished.  HENT:  Head: Normocephalic and atraumatic.  Neck: Normal range of motion. Neck supple.  Cardiovascular: Normal rate and regular rhythm.   Pulmonary/Chest: Effort normal and breath sounds normal.  Musculoskeletal:  Normal Muscle Bulk and Muscle Testing Reveals : Upper Extremities: Full ROM and Muscle Strength 5/5 Lower Extremities: Right: Full ROM and Muscle Strength 5/5 Left AKA: wearing Prosthesis : Stump looks good, no opening areas Arrived in his scooter   Neurological: He is alert and oriented to person, place, and time.  Skin: Skin is warm and dry.  Psychiatric: He has a normal mood and affect.  Nursing note and vitals reviewed.         Assessment & Plan:  1. Functional deficits secondary to left AKA.[place] with Exercise Regime: 05/24/2016 2. Phantom limb pain: Continue Gabapentin. 05/24/2016 Refilled: Increased Percocet 10/325 mg percocet one tablet every 8 hours as needed for breakthrough pain, may take an extra tablet ten days a month.  We will continue the opioid monitoring program, this consists of regular clinic visits, examinations,  urine drug screen, pill counts as well as use of [place]  Controlled Substance Reporting System.   15 minutes of face to face patient care time was spent during this visit. All questions were encouraged and answered.   F/U in 1 month

## 2016-06-04 ENCOUNTER — Telehealth: Payer: Self-pay | Admitting: Internal Medicine

## 2016-06-04 ENCOUNTER — Ambulatory Visit (INDEPENDENT_AMBULATORY_CARE_PROVIDER_SITE_OTHER): Payer: PPO

## 2016-06-04 ENCOUNTER — Telehealth: Payer: Self-pay | Admitting: Cardiology

## 2016-06-04 DIAGNOSIS — I5022 Chronic systolic (congestive) heart failure: Secondary | ICD-10-CM | POA: Diagnosis not present

## 2016-06-04 DIAGNOSIS — Z95810 Presence of automatic (implantable) cardiac defibrillator: Secondary | ICD-10-CM

## 2016-06-04 NOTE — Telephone Encounter (Signed)
Patient informed that remote transmission has not been received.  Verbal instructions provided for patient.   Transmission is being sent. Will forward information to Sharman Cheek, RN.

## 2016-06-04 NOTE — Telephone Encounter (Signed)
New message    1. Has your device fired? No   2. Is you device beeping? No   3. Are you experiencing draining or swelling at device site? No   4. Are you calling to see if we received your device transmission? Yes   5. Have you passed out? No

## 2016-06-04 NOTE — Progress Notes (Signed)
EPIC Encounter for ICM Monitoring  Patient Name: [name] is a 82 y.o. male Date: 06/04/2016 Primary Care Physican: [name], MD Primary Cardiologist:Smith Electrophysiologist: Allred Dry Weight:Unknown - amputee Bi-V Pacing: 99.7%  Clinical Status (16-May-2016 to 04-Jun-2016) Treated VT/VF 0 episodes AT/AF 15 episodes  Time in AT/AF 24.0 hr/day (100.0%)  Observations (2) (16-May-2016 to 04-Jun-2016)  AT/AF >= 6 hr for 20 days.  Patient Activity less than 1 hr/day      Heart Failure questions reviewed, pt asymptomatic.   Thoracic impedance normal.  Prescribed Furosemide 40 mg 1 tablet daily but patient said dosage was decreased to 1/2 tablet (20 mg total) daily  Labs: 12/29/2017Creatinine 1.93, BUN 45, Potassium 4.4, Sodium 139 02/19/2016 Creatinine 3.01, BUN 89, Potassium 4.9, Sodium 138  01/01/2016 Creatinine 2.05, BUN 39, Potassium 5.2, Sodium 141  11/08/2015 Creatinine 2.00, BUN 49, Potassium 5.3, Sodium 141  08/28/2015 Creatinine 1.75, BUN 26, Potassium 5.2, Sodium 141   Recommendations: No changes.  Encouraged to call for fluid symptoms.  Follow-up plan: ICM clinic phone appointment on 07/05/2016.    Copy of ICM check sent to device physician.   3 month ICM trend: 06/04/2016   1 Year ICM trend:      Rosalene Billings, RN 06/04/2016 2:15 PM

## 2016-06-04 NOTE — Telephone Encounter (Signed)
Spoke with pt and reminded pt of remote transmission that is due today. Pt verbalized understanding.   

## 2016-06-05 ENCOUNTER — Emergency Department (HOSPITAL_BASED_OUTPATIENT_CLINIC_OR_DEPARTMENT_OTHER)
Admission: EM | Admit: 2016-06-05 | Discharge: 2016-06-05 | Disposition: A | Discharge status: Home / Self Care | Payer: PPO | Attending: Emergency Medicine | Admitting: Emergency Medicine

## 2016-06-05 ENCOUNTER — Other Ambulatory Visit (HOSPITAL_BASED_OUTPATIENT_CLINIC_OR_DEPARTMENT_OTHER): Payer: PPO

## 2016-06-05 ENCOUNTER — Encounter (HOSPITAL_BASED_OUTPATIENT_CLINIC_OR_DEPARTMENT_OTHER): Payer: Self-pay | Admitting: *Deleted

## 2016-06-05 ENCOUNTER — Ambulatory Visit (INDEPENDENT_AMBULATORY_CARE_PROVIDER_SITE_OTHER): Payer: PPO | Admitting: Adult Health

## 2016-06-05 ENCOUNTER — Encounter: Payer: Self-pay | Admitting: Adult Health

## 2016-06-05 ENCOUNTER — Emergency Department (HOSPITAL_BASED_OUTPATIENT_CLINIC_OR_DEPARTMENT_OTHER): Payer: PPO

## 2016-06-05 VITALS — BP 102/60 | Temp 97.8°F

## 2016-06-05 DIAGNOSIS — E1122 Type 2 diabetes mellitus with diabetic chronic kidney disease: Secondary | ICD-10-CM | POA: Diagnosis not present

## 2016-06-05 DIAGNOSIS — R479 Unspecified speech disturbances: Secondary | ICD-10-CM

## 2016-06-05 DIAGNOSIS — N184 Chronic kidney disease, stage 4 (severe): Secondary | ICD-10-CM | POA: Insufficient documentation

## 2016-06-05 DIAGNOSIS — K8020 Calculus of gallbladder without cholecystitis without obstruction: Secondary | ICD-10-CM | POA: Insufficient documentation

## 2016-06-05 DIAGNOSIS — I5022 Chronic systolic (congestive) heart failure: Secondary | ICD-10-CM | POA: Diagnosis not present

## 2016-06-05 DIAGNOSIS — Z79899 Other long term (current) drug therapy: Secondary | ICD-10-CM | POA: Insufficient documentation

## 2016-06-05 DIAGNOSIS — I130 Hypertensive heart and chronic kidney disease with heart failure and stage 1 through stage 4 chronic kidney disease, or unspecified chronic kidney disease: Secondary | ICD-10-CM | POA: Insufficient documentation

## 2016-06-05 DIAGNOSIS — Z95810 Presence of automatic (implantable) cardiac defibrillator: Secondary | ICD-10-CM | POA: Insufficient documentation

## 2016-06-05 DIAGNOSIS — I2510 Atherosclerotic heart disease of native coronary artery without angina pectoris: Secondary | ICD-10-CM | POA: Insufficient documentation

## 2016-06-05 DIAGNOSIS — Z7901 Long term (current) use of anticoagulants: Secondary | ICD-10-CM | POA: Insufficient documentation

## 2016-06-05 DIAGNOSIS — R0602 Shortness of breath: Secondary | ICD-10-CM

## 2016-06-05 DIAGNOSIS — F17290 Nicotine dependence, other tobacco product, uncomplicated: Secondary | ICD-10-CM | POA: Diagnosis not present

## 2016-06-05 DIAGNOSIS — Z794 Long term (current) use of insulin: Secondary | ICD-10-CM | POA: Diagnosis not present

## 2016-06-05 DIAGNOSIS — R1012 Left upper quadrant pain: Secondary | ICD-10-CM

## 2016-06-05 DIAGNOSIS — Z7982 Long term (current) use of aspirin: Secondary | ICD-10-CM | POA: Insufficient documentation

## 2016-06-05 DIAGNOSIS — R1031 Right lower quadrant pain: Secondary | ICD-10-CM | POA: Diagnosis not present

## 2016-06-05 DIAGNOSIS — N200 Calculus of kidney: Secondary | ICD-10-CM | POA: Diagnosis not present

## 2016-06-05 LAB — COMPREHENSIVE METABOLIC PANEL
ALBUMIN: 3.4 g/dL — AB (ref 3.5–5.0)
ALT: 94 U/L — AB (ref 17–63)
AST: 109 U/L — AB (ref 15–41)
Alkaline Phosphatase: 76 U/L (ref 38–126)
Anion gap: 11 (ref 5–15)
BUN: 61 mg/dL — ABNORMAL HIGH (ref 6–20)
CHLORIDE: 103 mmol/L (ref 101–111)
CO2: 22 mmol/L (ref 22–32)
Calcium: 8.6 mg/dL — ABNORMAL LOW (ref 8.9–10.3)
Creatinine, Ser: 2.67 mg/dL — ABNORMAL HIGH (ref 0.61–1.24)
GFR calc Af Amer: 24 mL/min — ABNORMAL LOW (ref >60)
GFR, EST NON AFRICAN AMERICAN: 21 mL/min — AB (ref >60)
Glucose, Bld: 155 mg/dL — ABNORMAL HIGH (ref 65–99)
POTASSIUM: 4.3 mmol/L (ref 3.5–5.1)
SODIUM: 136 mmol/L (ref 135–145)
Total Bilirubin: 1.3 mg/dL — ABNORMAL HIGH (ref 0.3–1.2)
Total Protein: 6.8 g/dL (ref 6.5–8.1)

## 2016-06-05 LAB — URINALYSIS, ROUTINE W REFLEX MICROSCOPIC
Bilirubin Urine: NEGATIVE
GLUCOSE, UA: NEGATIVE mg/dL
HGB URINE DIPSTICK: NEGATIVE
Ketones, ur: NEGATIVE mg/dL
Nitrite: NEGATIVE
PH: 5 (ref 5.0–8.0)
Protein, ur: NEGATIVE mg/dL
Specific Gravity, Urine: 1.019 (ref 1.005–1.030)

## 2016-06-05 LAB — PROTIME-INR
INR: 3.37
Prothrombin Time: 34.9 seconds — ABNORMAL HIGH (ref 11.4–15.2)

## 2016-06-05 LAB — URINALYSIS, MICROSCOPIC (REFLEX): RBC / HPF: NONE SEEN RBC/hpf (ref 0–5)

## 2016-06-05 LAB — CBC
HEMATOCRIT: 36.8 % — AB (ref 39.0–52.0)
Hemoglobin: 12.1 g/dL — ABNORMAL LOW (ref 13.0–17.0)
MCH: 27.9 pg (ref 26.0–34.0)
MCHC: 32.9 g/dL (ref 30.0–36.0)
MCV: 84.8 fL (ref 78.0–100.0)
Platelets: 165 10*3/uL (ref 150–400)
RBC: 4.34 MIL/uL (ref 4.22–5.81)
RDW: 14.5 % (ref 11.5–15.5)
WBC: 9.8 10*3/uL (ref 4.0–10.5)

## 2016-06-05 LAB — LIPASE, BLOOD: LIPASE: 26 U/L (ref 11–51)

## 2016-06-05 LAB — I-STAT CG4 LACTIC ACID, ED: LACTIC ACID, VENOUS: 1.9 mmol/L (ref 0.5–1.9)

## 2016-06-05 MED ORDER — SODIUM CHLORIDE 0.9 % IV BOLUS (SEPSIS)
500.0000 mL | Freq: Once | INTRAVENOUS | Status: AC
  Administered 2016-06-05: 500 mL via INTRAVENOUS

## 2016-06-05 MED ORDER — TRAMADOL HCL 50 MG PO TABS: 50.0000 mg | ORAL_TABLET | Freq: Once | ORAL | Status: DC

## 2016-06-05 NOTE — ED Triage Notes (Signed)
Generalized abdominal pain without N/V/D x several days. LBM today, denies urinary symptoms.

## 2016-06-05 NOTE — ED Notes (Signed)
PT states understanding of care given, follow up care. PT wheelchaired from ED to car.

## 2016-06-05 NOTE — ED Provider Notes (Signed)
[facility] DEPT [facility] Provider Note   CSN: [id number] Arrival date & time: 06/05/16  1619     History   Chief Complaint Chief Complaint  Patient presents with  . Abdominal Pain    HPI [name] is a 82 y.o. male.  HPI Patient presents with 4 days of abdominal pain. States the pain is episodic. Worse with movement and deep breathing. Denies nausea, vomiting or diarrhea. Last bowel movement was this morning and was normal. Denies bloody or melanotic stools. Normal appetite. Denies fever or chills. No generalized weakness or fatigue. No chest pain or shortness of breath. No similar symptoms. Seen by his primary physician and referred to the emergency department for evaluation. Past Medical History:  Diagnosis Date  . Arthritis   . Atrial fibrillation ([place])   . Benign prostatic hypertrophy   . CAD (coronary artery disease)    a. s/p mid LAD stenting with DES 2008 Dr. Daneen Schick  . Chronic systolic dysfunction of left ventricle    a. mixed ischemic and nonischemic CM,  EF 35%. b. s/p AICD implantation.  . Constipation   . Depression   . ED (erectile dysfunction)   . Gout   . History of MRSA infection   . Hypertension    takes Carvedilol and Losartan daily  . Insomnia   . PAD (peripheral artery disease) (HCC)    s/p multiple LLE bypass grafts; left mid-distal SCA occlusion by 08/2012 duplex  . Renal artery stenosis ([place])    s/p stenting 2009  . Sleep apnea    hx of "had surgery for"  . Type II diabetes mellitus [facility])     Patient Active Problem List   Diagnosis Date Noted  . 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 Decubitus ulcer of sacral area 08/17/2015  . Phantom limb syndrome with pain ([place]) 08/03/2014  . Diabetic polyneuropathy associated with diabetes mellitus due to underlying condition ([place]) 07/12/2014  . CVA (cerebral infarction) 06/15/2014  . Expressive aphasia 06/15/2014  . CKD (chronic kidney disease) stage 4, GFR  15-29 ml/min (HCC) 06/15/2014  . DM (diabetes mellitus), type 2, uncontrolled, with renal complications ([place]) 28/00/3491  . Unilateral AKA ([place]) 02/09/2013  . S/P ICD (internal cardiac defibrillator) procedure 05/18/2012  . PAD (peripheral artery disease) ([place]) 09/04/2011  . CAD (coronary artery disease)   . Chronic systolic congestive heart failure ([place]) 07/06/2010  . Gout 09/18/2007  . Essential hypertension 01/23/2007  . Chronic atrial fibrillation ([place]) 01/23/2007  . BPH (benign prostatic hyperplasia) 01/23/2007    Past Surgical History:  Procedure Laterality Date  . ABDOMINAL ANGIOGRAM  12/25/2011   Procedure: ABDOMINAL ANGIOGRAM;  Surgeon: Serafina Mitchell, MD;  Location: [facility] CATH LAB;  Service: Cardiovascular;;  . ABDOMINAL AORTAGRAM N/A 09/11/2011   Procedure: ABDOMINAL Maxcine Ham;  Surgeon: Wellington Hampshire, MD;  Location: [facility] CATH LAB;  Service: Cardiovascular;  Laterality: N/A;  . ADENOIDECTOMY     Hx; of   . AMPUTATION Left 02/04/2013   Procedure: AMPUTATION ABOVE KNEE-LEFT;  Surgeon: Mal Misty, MD;  Location: [place];  Service: Vascular;  Laterality: Left;  . BICEPS TENDON REPAIR Right 2001   Archie Endo 03/20/2001 (07/10/2012)  . BIV ICD GENERTAOR CHANGE OUT N/A 11/09/2013   Procedure: BIV ICD GENERTAOR CHANGE OUT;  Surgeon: Coralyn Mark, MD;  Location: [facility] CATH LAB;  Service: Cardiovascular;  Laterality: N/A;  . CARDIAC DEFIBRILLATOR PLACEMENT  12/26/09   pacemaker combo  . CARPAL TUNNEL RELEASE Right 2002   /  notes 03/20/2001 (07/10/2012)  . cervical epidural injection  2013  . COLONOSCOPY     Hx; of  . CORONARY ANGIOPLASTY    . CORONARY ANGIOPLASTY WITH STENT PLACEMENT  ~ 2000  . EMBOLECTOMY  02/07/2012   Procedure: EMBOLECTOMY;  Surgeon: Rosetta Posner, MD;  Location: [place];  Service: Vascular;  Laterality: Left;  . EMBOLECTOMY Left 12/16/2012   Procedure: THROMBECTOMY  LEFT LEG BYPASS;  Surgeon: Elam Dutch, MD;  Location: [place];  Service: Vascular;  Laterality: Left;  .  FEMORAL-POPLITEAL BYPASS GRAFT Left 09/16/2012   Procedure: LEFT FEMORAL-POPLITEAL BYPASS GRAFT WITH GORTEX Propaten Graft 6x80 Thin Wall and Left lower leg Angiogram;  Surgeon: Mal Misty, MD;  Location: [place];  Service: Vascular;  Laterality: Left;  . FEMORAL-TIBIAL BYPASS GRAFT  09/25/2011   Procedure: BYPASS GRAFT FEMORAL-TIBIAL ARTERY;  Surgeon: Mal Misty, MD;  Location[facility] OR;  Service: Vascular;  Laterality: Left;  Left Femoral - Anterior Tibial Bypass;  saphenous vein graft from left leg  . FEMORAL-TIBIAL BYPASS GRAFT  02/07/2012   Procedure: BYPASS GRAFT FEMORAL-TIBIAL ARTERY;  Surgeon: Rosetta Posner, MD;  Location: [place];  Service: Vascular;  Laterality: Left;  Thrombectomy Left Femoral - Tibial Bypass Graft  . FEMORAL-TIBIAL BYPASS GRAFT  04/03/2012   Procedure: BYPASS GRAFT FEMORAL-TIBIAL ARTERY;  Surgeon: Mal Misty, MD;  Location: [place];  Service: Vascular;  Laterality: Left;  Redo  . FEMORAL-TIBIAL BYPASS GRAFT Left 09/30/2012   Procedure: REDO LEFT FEMORAL-ANTERIOR TIBIAL ARTERY BYPASS USING COMPOSITE CEPHALIC AND BASILIC VEIN GRAFT FROM LEFT ARM;  Surgeon: Mal Misty, MD;  Location: [place];  Service: Vascular;  Laterality: Left;  . FOOT SURGERY Right 03/20/2001   "have plates and screws in; didn't break it" (07/10/2012)  . GROIN DEBRIDEMENT Left 11/12/2012   Procedure: CLOSURE INGUINAL WOUND;  Surgeon: Mal Misty, MD;  Location: [place];  Service: Vascular;  Laterality: Left;  . I&D EXTREMITY Left 10/21/2012   Procedure: EXPLORATION AND DEBRIDEMENT OF LEFT GROIN WOUND;  Surgeon: Mal Misty, MD;  Location: [place];  Service: Vascular;  Laterality: Left;  . INSERT / REPLACE / REMOVE PACEMAKER  2007  . INTRAOPERATIVE ARTERIOGRAM  09/25/2011   Procedure: INTRA OPERATIVE ARTERIOGRAM;  Surgeon: Mal Misty, MD;  Location: [place];  Service: Vascular;  Laterality: Left;  . LEG AMPUTATION ABOVE KNEE Left 02/04/2013   DR LAWSON  . LOWER EXTREMITY ANGIOGRAM Left 12/25/2011    Procedure: LOWER EXTREMITY ANGIOGRAM;  Surgeon: Serafina Mitchell, MD;  Location: [facility] CATH LAB;  Service: Cardiovascular;  Laterality: Left;  lt leg angio co2  . LOWER EXTREMITY ANGIOGRAM N/A 07/07/2012   Procedure: LOWER EXTREMITY ANGIOGRAM;  Surgeon: Conrad Vandervoort, MD;  Location: [facility] CATH LAB;  Service: Cardiovascular;  Laterality: N/A;  . LOWER EXTREMITY ANGIOGRAM N/A 09/15/2012   Procedure: LOWER EXTREMITY ANGIOGRAM;  Surgeon: Serafina Mitchell, MD;  Location: [facility] CATH LAB;  Service: Cardiovascular;  Laterality: N/A;  . PATCH ANGIOPLASTY Left 10/21/2012   Procedure: PATCH ANGIOPLASTY;  Surgeon: Mal Misty, MD;  Location: [place];  Service: Vascular;  Laterality: Left;  . PERIPHERAL VASCULAR CATHETERIZATION N/A 11/08/2015   Procedure: Abdominal Aortogram w/Lower Extremity;  Surgeon: Waynetta Sandy, MD;  Location: [place] CV LAB;  Service: Cardiovascular;  Laterality: N/A;  . REMOVAL OF GRAFT Left 04/16/2013   Procedure: I & D LEFT AKA WOUND, POSSIBLE REMOVAL OF INFECTED GORTEX GRAFT;  Surgeon: Mal Misty, MD;  Location:  [facility] OR;  Service: Vascular;  Laterality: Left;  . RENAL ARTERY STENT  2009  . SHOULDER OPEN ROTATOR CUFF REPAIR Right 2001   repair of lacerated right/notes 10/11/1999  (07/10/2012)  . TONSILLECTOMY    . TURP VAPORIZATION    . UPPER EXTREMITY ANGIOGRAM  09/15/2012   Procedure: UPPER EXTREMITY ANGIOGRAM;  Surgeon: Serafina Mitchell, MD;  Location: [facility] CATH LAB;  Service: Cardiovascular;;  . UVULOPALATOPHARYNGOPLASTY (UPPP)/TONSILLECTOMY/SEPTOPLASTY  06/30/2003   Archie Endo 06/30/2003 (07/10/2012)       Home Medications    Prior to Admission medications   Medication Sig Start Date End Date Taking? Authorizing Provider  allopurinol (ZYLOPRIM) 100 MG tablet Take 1 tablet (100 mg total) by mouth daily as needed (Gout). 05/03/15   Laurey Morale, MD  amLODipine (NORVASC) 5 MG tablet Take 1 tablet (5 mg total) by mouth daily. 05/03/15   Laurey Morale, MD  aspirin EC 81 MG tablet Take 81 mg  by mouth daily.    Historical Provider, MD  carvedilol (COREG) 25 MG tablet Take 1 tablet (25 mg total) by mouth 2 (two) times daily with a meal. 05/03/15   Laurey Morale, MD  finasteride (PROSCAR) 5 MG tablet Take 1 tablet (5 mg total) by mouth daily. 05/03/15   Laurey Morale, MD  furosemide (LASIX) 40 MG tablet Take 1 tablet (40 mg total) by mouth daily. 02/19/16 05/19/16  Belva Crome, MD  gabapentin (NEURONTIN) 300 MG capsule Take 2 capsules (600 mg total) by mouth 3 (three) times daily. 08/17/15   Laurey Morale, MD  insulin aspart protamine- aspart (NOVOLOG MIX 70/30) (70-30) 100 UNIT/ML injection Inject 7 Units into the skin 2 (two) times daily with a meal. 03/03/13   Kennyth Arnold, FNP  Insulin Pen Needle 31G X 8 MM MISC Use 2-3 times per day and diagnosis code is 250.00 11/25/13   Laurey Morale, MD  losartan (COZAAR) 100 MG tablet Take 1 tablet (100 mg total) by mouth daily. 05/03/15   Laurey Morale, MD  nitroGLYCERIN (NITROSTAT) 0.4 MG SL tablet Place 1 tablet (0.4 mg total) under the tongue every 5 (five) minutes as needed for chest pain. For chest pain, max 3 doses 05/28/13   Laurey Morale, MD  ONE TOUCH ULTRA TEST test strip USE TO TEST BLOOD SUGAR TWO TIMES DAILY AS INSTRUCTED BY PHYSICIAN. 03/07/16   Laurey Morale, MD  oxyCODONE-acetaminophen (PERCOCET) 10-325 MG tablet Take 1 tablet by mouth every 8 (eight) hours as needed for pain. May take an extra tablet when pain is severe. Only ten days out of the month 05/24/16   Bayard Hugger, NP  warfarin (COUMADIN) 5 MG tablet TAKE AS DIRECTED BY ANTICOAGULATION CLINIC 04/03/16   Laurey Morale, MD    Family History Family History  Problem Relation Age of Onset  . Cancer Father   . Cancer      breast/fhx  . Heart disease      fhx  . Diabetes Neg Hx     Social History Social History  Substance Use Topics  . Smoking status: Current Some Day Smoker    Types: Cigars  . Smokeless tobacco: Never Used     Comment: 1 cigar each month  . Alcohol  use 0.0 oz/week     Comment: once a week wine     Allergies   Other and Bactrim ds [sulfamethoxazole-trimethoprim]   Review of Systems Review of Systems  Constitutional: Negative for chills and  fever.  Respiratory: Negative for shortness of breath.   Cardiovascular: Negative for chest pain.  Gastrointestinal: Positive for abdominal pain. Negative for abdominal distention, blood in stool, constipation, diarrhea, nausea and vomiting.  Genitourinary: Negative for dysuria, flank pain, frequency and hematuria.  Musculoskeletal: Negative for back pain, myalgias, neck pain and neck stiffness.  Skin: Negative for rash and wound.  Neurological: Negative for dizziness, weakness, light-headedness, numbness and headaches.  All other systems reviewed and are negative.    Physical Exam Updated Vital Signs BP 132/73   Pulse 69   Temp 98.3 F (36.8 C) (Oral)   Resp 16   Ht 5\' 8"  (1.727 m)   Wt 172 lb (78 kg)   SpO2 92%   BMI 26.15 kg/m   Physical Exam  Constitutional: He is oriented to person, place, and time. He appears well-developed and well-nourished.  HENT:  Head: Normocephalic and atraumatic.  Mouth/Throat: Oropharynx is clear and moist. No oropharyngeal exudate.  Eyes: EOM are normal. Pupils are equal, round, and reactive to light.  Neck: Normal range of motion. Neck supple.  Cardiovascular: Normal rate and regular rhythm.   Pulmonary/Chest: Effort normal and breath sounds normal.  Abdominal: Soft. Bowel sounds are normal. He exhibits no distension and no mass. There is tenderness (patient with tenderneand right lower quadrants that appeant. There is no rebound or guarding.). There is no rebound and no guarding.  Musculoskeletal: Normal range of motion. He exhibits no edema or tenderness.  No CVA tenderness bilaterally. No lower extremity swelling or tenderness. Left AKA.  Neurological: He is alert and oriented to person, place, and time.  Moving all extremities without focal  deficit. Sensation fully intact.  Skin: Skin is warm and dry. Capillary refill takes less than 2 seconds. No rash noted. No erythema.  Psychiatric: He has a normal mood and affect. His behavior is normal.  Nursing note and vitals reviewed.    ED Treatments / Results  Labs (all labs ordered are listed, but only abnormal results are displayed) Labs Reviewed  COMPREHENSIVE METABOLIC PANEL - Abnormal; Notable for the following:       Result Value   Glucose, Bld 155 (*)    BUN 61 (*)    Creatinine, Ser 2.67 (*)    Calcium 8.6 (*)    Albumin 3.4 (*)    AST 109 (*)    ALT 94 (*)    Total Bilirubin 1.3 (*)    GFR calc non Af Amer 21 (*)    GFR calc Af Amer 24 (*)    All other components within normal limits  CBC - Abnormal; Notable for the following:    Hemoglobin 12.1 (*)    HCT 36.8 (*)    All other components within normal limits  URINALYSIS, ROUTINE W REFLEX MICROSCOPIC - Abnormal; Notable for the following:    Color, Urine AMBER (*)    Leukocytes, UA TRACE (*)    All other components within normal limits  PROTIME-INR - Abnormal; Notable for the following:    Prothrombin Time 34.9 (*)    All other components within normal limits  URINALYSIS, MICROSCOPIC (REFLEX) - Abnormal; Notable for the following:    Bacteria, UA RARE (*)    Squamous Epithelial / LPF 0-5 (*)    All other components within normal limits  LIPASE, BLOOD  I-STAT CG4 LACTIC ACID, ED    EKG  EKG Interpretation None       Radiology Ct Abdomen Pelvis Wo Contrast  Result Date: 06/05/2016  CLINICAL DATA:  Generalized abdominal pain for several days EXAM: CT ABDOMEN AND PELVIS WITHOUT CONTRAST TECHNIQUE: Multidetector CT imaging of the abdomen and pelvis was performed following the standard protocol without IV contrast. COMPARISON:  01/01/2016 FINDINGS: Lower chest: Lung bases demonstrate mild dependent atelectasis. No consolidation or pleural effusion. Cardiomegaly with intracardiac pacing leads. Small  pericardial effusion. Small distal esophageal hiatal hernia Hepatobiliary: No focal hepatic abnormality. Multiple calcified stones within the gallbladder. No biliary dilatation Pancreas: Unremarkable. No pancreatic ductal dilatation or surrounding inflammatory changes. Spleen: Normal in size without focal abnormality. Calcified granuloma Adrenals/Urinary Tract: Adrenal glands are within normal limits. There is atrophy of the right kidney. No hydronephrosis. Punctate stones or intrarenal vascular calcifications. There are bilateral renal cysts. The bladder is grossly unremarkable. Stomach/Bowel: Stomach is within normal limits. Appendix appears normal. No evidence of bowel wall thickening, distention, or inflammatory changes. Vascular/Lymphatic: Aortic atherosclerosis. No enlarged abdominal or pelvic lymph nodes. Reproductive: No masses. Other: Fat within the inguinal canals.  No free air or free fluid. Musculoskeletal: Grade 1 anterolisthesis of L5 on S1 with bilateral chronic pars defect. Mild wedging of L1 with small sclerotic focus, unchanged. IMPRESSION: 1. No definite CT evidence for acute intra-abdominal or pelvic pathology 2. Multiple gallstones 3. Small pericardial effusion 4. Atrophic right kidney. Bilateral renal cysts. No hydronephrosis. Electronically Signed   By: Donavan Foil M.D.   On: 06/05/2016 19:21    Procedures Procedures (including critical care time)  Medications Ordered in ED Medications  traMADol (ULTRAM) tablet 50 mg (50 mg Oral Refused 06/05/16 2024)  sodium chloride 0.9 % bolus 500 mL (0 mLs Intravenous Stopped 06/05/16 1851)     Initial Impression / Assessment and Plan / ED Course  I have reviewed the triage vital signs and the nursing notes.  Pertinent labs & imaging results that were available during my care of the patient were reviewed by me and considered in my medical decision making (see chart for details).    Patient's pain is minimal. Very mild tenderness with  palpation. No rebound or guarding. Patient has a normal white blood cell count. Mild elevation in his LFTs. CT with evidence of cholelithiasis but no evidence of cholecystitis. Discussed with Dr. Marcello Moores. Thinks the patient can follow-up as outpatient if his symptoms are controlled. Given strict return precautions.   Final Clinical Impressions(s) / ED Diagnoses   Final diagnoses:  Calculus of gallbladder without cholecystitis without obstruction    New Prescriptions New Prescriptions   No medications on file     Julianne Rice, MD 06/05/16 2029

## 2016-06-05 NOTE — Patient Instructions (Signed)
I think you need further evaluation in the ER   Please either present to [facility] or [facility] ER  [address], [place], [place] [address]

## 2016-06-05 NOTE — Progress Notes (Signed)
Subjective:    Patient ID: [name], male    DOB: August 10, 1934, 82 y.o.   MRN: [id number]  HPI  82 year old male who presents to the office today for three days of lower abdominal pain that is worse with taking a deep breathAnd movement. He is unable to describe the pain at this time. He denies any nausea, vomiting, or diarrhea. His last bowel movement was this morning and he considered it "normal". He also reports that since abdominal pain started he has had shortness of breath that is especially apparent with exertion. He denies any cough, fevers, or fatigue.  Additionally, he reports that 2 nights ago he had an episode that lasted about 3 or 4 minutes where he felt as though his speech was "garbled" and he could not talk. This spontaneously resolved and he has not had another issue since. He denies having severe headache after this incident but does report of slight generalized headache. Denies any facial droop or blurred vision and during this incident. He does have a history of expressive aphasia and CVA.  Review of Systems  Constitutional: Negative.   HENT: Negative.   Eyes: Negative.   Respiratory: Positive for shortness of breath. Negative for cough and chest tightness.   Cardiovascular: Negative.   Gastrointestinal: Positive for abdominal pain. Negative for abdominal distention, anal bleeding, blood in stool, constipation, diarrhea, nausea, rectal pain and vomiting.  Genitourinary: Negative.   Neurological: Positive for speech difficulty and headaches.  Hematological: Negative.   Psychiatric/Behavioral: Negative.   All other systems reviewed and are negative.  Past Medical History:  Diagnosis Date  . Arthritis   . Atrial fibrillation ([place])   . Benign prostatic hypertrophy   . CAD (coronary artery disease)    a. s/p mid LAD stenting with DES 2008 Dr. Daneen Schick  . Chronic systolic dysfunction of left ventricle    a. mixed ischemic and nonischemic CM,  EF 35%. b. s/p  AICD implantation.  . Constipation   . Depression   . ED (erectile dysfunction)   . Gout   . History of MRSA infection   . Hypertension    takes Carvedilol and Losartan daily  . Insomnia   . PAD (peripheral artery disease) (HCC)    s/p multiple LLE bypass grafts; left mid-distal SCA occlusion by 08/2012 duplex  . Renal artery stenosis ([place])    s/p stenting 2009  . Sleep apnea    hx of "had surgery for"  . Type II diabetes mellitus ([place])     Social History   Social History  . Marital status: Widowed    Spouse name: N/A  . Number of children: N/A  . Years of education: N/A   Occupational History  . Retired Retired   Social History Main Topics  . Smoking status: Current Some Day Smoker    Types: Cigars  . Smokeless tobacco: Never Used     Comment: 1 cigar each month  . Alcohol use 0.0 oz/week     Comment: once a week wine  . Drug use: No  . Sexual activity: Not Currently   Other Topics Concern  . Not on file   Social History Narrative   Lives in [place] with significant other,  [name].    Past Surgical History:  Procedure Laterality Date  . ABDOMINAL ANGIOGRAM  12/25/2011   Procedure: ABDOMINAL ANGIOGRAM;  Surgeon: Serafina Mitchell, MD;  Location: [facility] CATH LAB;  Service: Cardiovascular;;  . ABDOMINAL AORTAGRAM N/A 09/11/2011  Procedure: ABDOMINAL AORTAGRAM;  Surgeon: Wellington Hampshire, MD;  Location: [facility] CATH LAB;  Service: Cardiovascular;  Laterality: N/A;  . ADENOIDECTOMY     Hx; of   . AMPUTATION Left 02/04/2013   Procedure: AMPUTATION ABOVE KNEE-LEFT;  Surgeon: Mal Misty, MD;  Location: [place];  Service: Vascular;  Laterality: Left;  . BICEPS TENDON REPAIR Right 2001   Archie Endo 03/20/2001 (07/10/2012)  . BIV ICD GENERTAOR CHANGE OUT N/A 11/09/2013   Procedure: BIV ICD GENERTAOR CHANGE OUT;  Surgeon: Coralyn Mark, MD;  Location: [facility] CATH LAB;  Service: Cardiovascular;  Laterality: N/A;  . CARDIAC DEFIBRILLATOR PLACEMENT  12/26/09   pacemaker combo  . CARPAL  TUNNEL RELEASE Right 2002   Archie Endo 03/20/2001 (07/10/2012)  . cervical epidural injection  2013  . COLONOSCOPY     Hx; of  . CORONARY ANGIOPLASTY    . CORONARY ANGIOPLASTY WITH STENT PLACEMENT  ~ 2000  . EMBOLECTOMY  02/07/2012   Procedure: EMBOLECTOMY;  Surgeon: Rosetta Posner, MD;  Location: [place];  Service: Vascular;  Laterality: Left;  . EMBOLECTOMY Left 12/16/2012   Procedure: THROMBECTOMY  LEFT LEG BYPASS;  Surgeon: Elam Dutch, MD;  Location: [place];  Service: Vascular;  Laterality: Left;  . FEMORAL-POPLITEAL BYPASS GRAFT Left 09/16/2012   Procedure: LEFT FEMORAL-POPLITEAL BYPASS GRAFT WITH GORTEX Propaten Graft 6x80 Thin Wall and Left lower leg Angiogram;  Surgeon: Mal Misty, MD;  Location: [place];  Service: Vascular;  Laterality: Left;  . FEMORAL-TIBIAL BYPASS GRAFT  09/25/2011   Procedure: BYPASS GRAFT FEMORAL-TIBIAL ARTERY;  Surgeon: Mal Misty, MD;  Location: [facility] OR;  Service: Vascular;  Laterality: Left;  Left Femoral - Anterior Tibial Bypass;  saphenous vein graft from left leg  . FEMORAL-TIBIAL BYPASS GRAFT  02/07/2012   Procedure: BYPASS GRAFT FEMORAL-TIBIAL ARTERY;  Surgeon: Rosetta Posner, MD;  Location: [place];  Service: Vascular;  Laterality: Left;  Thrombectomy Left Femoral - Tibial Bypass Graft  . FEMORAL-TIBIAL BYPASS GRAFT  04/03/2012   Procedure: BYPASS GRAFT FEMORAL-TIBIAL ARTERY;  Surgeon: Mal Misty, MD;  Location: [place];  Service: Vascular;  Laterality: Left;  Redo  . FEMORAL-TIBIAL BYPASS GRAFT Left 09/30/2012   Procedure: REDO LEFT FEMORAL-ANTERIOR TIBIAL ARTERY BYPASS USING COMPOSITE CEPHALIC AND BASILIC VEIN GRAFT FROM LEFT ARM;  Surgeon: Mal Misty, MD;  Location: [place];  Service: Vascular;  Laterality: Left;  . FOOT SURGERY Right 03/20/2001   "have plates and screws in; didn't break it" (07/10/2012)  . GROIN DEBRIDEMENT Left 11/12/2012   Procedure: CLOSURE INGUINAL WOUND;  Surgeon: Mal Misty, MD;  Location: [place];  Service: Vascular;  Laterality: Left;   . I&D EXTREMITY Left 10/21/2012   Procedure: EXPLORATION AND DEBRIDEMENT OF LEFT GROIN WOUND;  Surgeon: Mal Misty, MD;  Location: [place];  Service: Vascular;  Laterality: Left;  . INSERT / REPLACE / REMOVE PACEMAKER  2007  . INTRAOPERATIVE ARTERIOGRAM  09/25/2011   Procedure: INTRA OPERATIVE ARTERIOGRAM;  Surgeon: Mal Misty, MD;  Location: [place];  Service: Vascular;  Laterality: Left;  . LEG AMPUTATION ABOVE KNEE Left 02/04/2013   DR LAWSON  . LOWER EXTREMITY ANGIOGRAM Left 12/25/2011   Procedure: LOWER EXTREMITY ANGIOGRAM;  Surgeon: Serafina Mitchell, MD;  Location: [facility] CATH LAB;  Service: Cardiovascular;  Laterality: Left;  lt leg angio co2  . LOWER EXTREMITY ANGIOGRAM N/A 07/07/2012   Procedure: LOWER EXTREMITY ANGIOGRAM;  Surgeon: Conrad Lake Annette, MD;  Location: [facility] CATH LAB;  Service:  Cardiovascular;  Laterality: N/A;  . LOWER EXTREMITY ANGIOGRAM N/A 09/15/2012   Procedure: LOWER EXTREMITY ANGIOGRAM;  Surgeon: Serafina Mitchell, MD;  Location: [facility] CATH LAB;  Service: Cardiovascular;  Laterality: N/A;  . PATCH ANGIOPLASTY Left 10/21/2012   Procedure: PATCH ANGIOPLASTY;  Surgeon: Mal Misty, MD;  Location: [place];  Service: Vascular;  Laterality: Left;  . PERIPHERAL VASCULAR CATHETERIZATION N/A 11/08/2015   Procedure: Abdominal Aortogram w/Lower Extremity;  Surgeon: Waynetta Sandy, MD;  Location: [place] CV LAB;  Service: Cardiovascular;  Laterality: N/A;  . REMOVAL OF GRAFT Left 04/16/2013   Procedure: I & D LEFT AKA WOUND, POSSIBLE REMOVAL OF INFECTED GORTEX GRAFT;  Surgeon: Mal Misty, MD;  Location: [place];  Service: Vascular;  Laterality: Left;  . RENAL ARTERY STENT  2009  . SHOULDER OPEN ROTATOR CUFF REPAIR Right 2001   repair of lacerated right/notes 10/11/1999  (07/10/2012)  . TONSILLECTOMY    . TURP VAPORIZATION    . UPPER EXTREMITY ANGIOGRAM  09/15/2012   Procedure: UPPER EXTREMITY ANGIOGRAM;  Surgeon: Serafina Mitchell, MD;  Location: [facility] CATH LAB;  Service:  Cardiovascular;;  . UVULOPALATOPHARYNGOPLASTY (UPPP)/TONSILLECTOMY/SEPTOPLASTY  06/30/2003   Archie Endo 06/30/2003 (07/10/2012)    Family History  Problem Relation Age of Onset  . Cancer Father   . Cancer      breast/fhx  . Heart disease      fhx  . Diabetes Neg Hx     Allergies  Allergen Reactions  . Other Other (See Comments)    Plastic tape tears skin  . Bactrim Ds [Sulfamethoxazole-Trimethoprim] Other (See Comments)    Hand tremors    Current Outpatient Prescriptions on File Prior to Visit  Medication Sig Dispense Refill  . allopurinol (ZYLOPRIM) 100 MG tablet Take 1 tablet (100 mg total) by mouth daily as needed (Gout). 90 tablet 3  . amLODipine (NORVASC) 5 MG tablet Take 1 tablet (5 mg total) by mouth daily. 90 tablet 3  . aspirin EC 81 MG tablet Take 81 mg by mouth daily.    . carvedilol (COREG) 25 MG tablet Take 1 tablet (25 mg total) by mouth 2 (two) times daily with a meal. 180 tablet 3  . finasteride (PROSCAR) 5 MG tablet Take 1 tablet (5 mg total) by mouth daily. 90 tablet 3  . gabapentin (NEURONTIN) 300 MG capsule Take 2 capsules (600 mg total) by mouth 3 (three) times daily. 540 capsule 3  . insulin aspart protamine- aspart (NOVOLOG MIX 70/30) (70-30) 100 UNIT/ML injection Inject 7 Units into the skin 2 (two) times daily with a meal.    . Insulin Pen Needle 31G X 8 MM MISC Use 2-3 times per day and diagnosis code is 250.00 100 each 1  . losartan (COZAAR) 100 MG tablet Take 1 tablet (100 mg total) by mouth daily. 90 tablet 3  . nitroGLYCERIN (NITROSTAT) 0.4 MG SL tablet Place 1 tablet (0.4 mg total) under the tongue every 5 (five) minutes as needed for chest pain. For chest pain, max 3 doses 90 tablet 3  . ONE TOUCH ULTRA TEST test strip USE TO TEST BLOOD SUGAR TWO TIMES DAILY AS INSTRUCTED BY PHYSICIAN. 100 each 4  . oxyCODONE-acetaminophen (PERCOCET) 10-325 MG tablet Take 1 tablet by mouth every 8 (eight) hours as needed for pain. May take an extra tablet when pain is  severe. Only ten days out of the month 100 tablet 0  . warfarin (COUMADIN) 5 MG tablet TAKE AS DIRECTED BY ANTICOAGULATION CLINIC 60  tablet 1  . furosemide (LASIX) 40 MG tablet Take 1 tablet (40 mg total) by mouth daily. 90 tablet 3   No current facility-administered medications on file prior to visit.     BP 102/60 (BP Location: Right Arm, Patient Position: Sitting, Cuff Size: Normal)   Temp 97.8 F (36.6 C) (Oral)       Objective:   Physical Exam  Constitutional: He is oriented to person, place, and time. He appears well-developed and well-nourished. No distress.  He appears in pain  Eyes: Conjunctivae and EOM are normal. Pupils are equal, round, and reactive to light. Right eye exhibits no discharge. Left eye exhibits no discharge.  Neck: Normal range of motion. Neck supple.  Cardiovascular: Normal rate, regular rhythm, normal heart sounds and intact distal pulses.  Exam reveals no gallop and no friction rub.   No murmur heard. Pulmonary/Chest: Effort normal and breath sounds normal. No respiratory distress. He has no wheezes. He has no rales. He exhibits no tenderness.  Abdominal: Soft. Normal appearance and bowel sounds are normal. He exhibits no distension and no mass. There is no hepatosplenomegaly, splenomegaly or hepatomegaly. There is tenderness in the right upper quadrant and right lower quadrant. There is no rigidity, no rebound, no guarding and no CVA tenderness. No hernia.  Musculoskeletal: He exhibits no edema, tenderness or deformity.  Left AKA  Lymphadenopathy:    He has no cervical adenopathy.  Neurological: He is alert and oriented to person, place, and time.  Skin: Skin is warm and dry. No rash noted. He is not diaphoretic. No erythema. No pallor.  No bruising noted  Psychiatric: He has a normal mood and affect. His behavior is normal. Judgment and thought content normal.  Nursing note and vitals reviewed.     Assessment & Plan:  Do to  symptoms of right upper  and right lower quadrant pain, shortness of breath with exertion, and some vague speech difficulty complaint , I feel as though he needs to be further evaluated in the emergency room with possible imaging and lab work done. - He will present to the ER via private vehicle.   Dorothyann Peng, NP

## 2016-06-07 ENCOUNTER — Other Ambulatory Visit: Payer: PPO

## 2016-06-10 ENCOUNTER — Ambulatory Visit (INDEPENDENT_AMBULATORY_CARE_PROVIDER_SITE_OTHER): Payer: PPO | Admitting: General Practice

## 2016-06-10 DIAGNOSIS — I482 Chronic atrial fibrillation, unspecified: Secondary | ICD-10-CM

## 2016-06-10 LAB — POCT INR: INR: 2.5

## 2016-06-10 NOTE — Patient Instructions (Signed)
Pre visit review using our clinic review tool, if applicable. No additional management support is needed unless otherwise documented below in the visit note. 

## 2016-06-19 ENCOUNTER — Encounter: Payer: PPO | Attending: Physical Medicine & Rehabilitation | Admitting: Physical Medicine & Rehabilitation

## 2016-06-19 ENCOUNTER — Encounter: Payer: Self-pay | Admitting: Physical Medicine & Rehabilitation

## 2016-06-19 VITALS — BP 128/62 | HR 62

## 2016-06-19 DIAGNOSIS — Z89612 Acquired absence of left leg above knee: Secondary | ICD-10-CM | POA: Insufficient documentation

## 2016-06-19 DIAGNOSIS — G546 Phantom limb syndrome with pain: Secondary | ICD-10-CM

## 2016-06-19 DIAGNOSIS — T8734 Neuroma of amputation stump, left lower extremity: Secondary | ICD-10-CM | POA: Diagnosis not present

## 2016-06-19 DIAGNOSIS — G8929 Other chronic pain: Secondary | ICD-10-CM | POA: Insufficient documentation

## 2016-06-19 DIAGNOSIS — F17290 Nicotine dependence, other tobacco product, uncomplicated: Secondary | ICD-10-CM | POA: Diagnosis not present

## 2016-06-19 DIAGNOSIS — K529 Noninfective gastroenteritis and colitis, unspecified: Secondary | ICD-10-CM | POA: Insufficient documentation

## 2016-06-19 DIAGNOSIS — G894 Chronic pain syndrome: Secondary | ICD-10-CM

## 2016-06-19 DIAGNOSIS — Z79899 Other long term (current) drug therapy: Secondary | ICD-10-CM

## 2016-06-19 DIAGNOSIS — Z5181 Encounter for therapeutic drug level monitoring: Secondary | ICD-10-CM

## 2016-06-19 MED ORDER — OXYCODONE-ACETAMINOPHEN 10-325 MG PO TABS: 1.0000 | ORAL_TABLET | Freq: Three times a day (TID) | ORAL | 0 refills | Status: DC | PRN

## 2016-06-19 NOTE — Progress Notes (Signed)
Subjective:    Patient ID: [name], male    DOB: [birth year], 82 y.o.   MRN: [id number]  HPI  Mr. Fotheringham is here in follow up of his chronic pain. He had minimal results with the nerve block on his distal stump in January. We adjusted his percocet recently to help with the ongoing pain.   Her reports that the [place] took an "xray " of his left stump with the concern over a "nodule". I asked him if he remembered me discussing this and he said "yes".   He remains limited in gait due to the pain on his stump. He can walk about 15 steps before he stops.    Pain Inventory Average Pain 6 Pain Right Now 3 My pain is sharp and stabbing  In the last 24 hours, has pain interfered with the following? General activity 4 Relation with others 6 Enjoyment of life 4 What TIME of day is your pain at its worst? . Sleep (in general) .  Pain is worse with: unsure Pain improves with: rest and medication Relief from Meds: 7  Mobility use a walker ability to climb steps?  yes do you drive?  yes  Function Do you have any goals in this area?  no  Neuro/Psych confusion depression  Prior Studies Any changes since last visit?  no  Physicians involved in your care Any changes since last visit?  no   Family History  Problem Relation Age of Onset  . Cancer Father   . Cancer      breast/fhx  . Heart disease      fhx  . Diabetes Neg Hx    Social History   Social History  . Marital status: Widowed    Spouse name: N/A  . Number of children: N/A  . Years of education: N/A   Occupational History  . Retired Retired   Social History Main Topics  . Smoking status: Current Some Day Smoker    Types: Cigars  . Smokeless tobacco: Never Used     Comment: 1 cigar each month  . Alcohol use 0.0 oz/week     Comment: once a week wine  . Drug use: No  . Sexual activity: Not Currently   Other Topics Concern  . Not on file   Social History Narrative   Lives in [place] with  significant other,  [name].   Past Surgical History:  Procedure Laterality Date  . ABDOMINAL ANGIOGRAM  12/25/2011   Procedure: ABDOMINAL ANGIOGRAM;  Surgeon: Serafina Mitchell, MD;  Location[facility] CATH LAB;  Service: Cardiovascular;;  . ABDOMINAL AORTAGRAM N/A 09/11/2011   Procedure: ABDOMINAL Maxcine Ham;  Surgeon: Wellington Hampshire, MD;  Location: [place] CATH LAB;  Service: Cardiovascular;  Laterality: N/A;  . ADENOIDECTOMY     Hx; of   . AMPUTATION Left 02/04/2013   Procedure: AMPUTATION ABOVE KNEE-LEFT;  Surgeon: Mal Misty, MD;  Location: [place];  Service: Vascular;  Laterality: Left;  . BICEPS TENDON REPAIR Right 2001   Archie Endo 03/20/2001 (07/10/2012)  . BIV ICD GENERTAOR CHANGE OUT N/A 11/09/2013   Procedure: BIV ICD GENERTAOR CHANGE OUT;  Surgeon: Coralyn Mark, MD;  Location: [facility] CATH LAB;  Service: Cardiovascular;  Laterality: N/A;  . CARDIAC DEFIBRILLATOR PLACEMENT  12/26/09   pacemaker combo  . CARPAL TUNNEL RELEASE Right 2002   Archie Endo 03/20/2001 (07/10/2012)  . cervical epidural injection  2013  . COLONOSCOPY     Hx; of  . CORONARY ANGIOPLASTY    .  CORONARY ANGIOPLASTY WITH STENT PLACEMENT  ~ 2000  . EMBOLECTOMY  02/07/2012   Procedure: EMBOLECTOMY;  Surgeon: Rosetta Posner, MD;  Location: [place];  Service: Vascular;  Laterality: Left;  . EMBOLECTOMY Left 12/16/2012   Procedure: THROMBECTOMY  LEFT LEG BYPASS;  Surgeon: Elam Dutch, MD;  Location: [place];  Service: Vascular;  Laterality: Left;  . FEMORAL-POPLITEAL BYPASS GRAFT Left 09/16/2012   Procedure: LEFT FEMORAL-POPLITEAL BYPASS GRAFT WITH GORTEX Propaten Graft 6x80 Thin Wall and Left lower leg Angiogram;  Surgeon: Mal Misty, MD;  Location: [place];  Service: Vascular;  Laterality: Left;  . FEMORAL-TIBIAL BYPASS GRAFT  09/25/2011   Procedure: BYPASS GRAFT FEMORAL-TIBIAL ARTERY;  Surgeon: Mal Misty, MD;  Location[facility];  Service: Vascular;  Laterality: Left;  Left Femoral - Anterior Tibial Bypass;  saphenous vein graft from  left leg  . FEMORAL-TIBIAL BYPASS GRAFT  02/07/2012   Procedure: BYPASS GRAFT FEMORAL-TIBIAL ARTERY;  Surgeon: Rosetta Posner, MD;  Location: [place];  Service: Vascular;  Laterality: Left;  Thrombectomy Left Femoral - Tibial Bypass Graft  . FEMORAL-TIBIAL BYPASS GRAFT  04/03/2012   Procedure: BYPASS GRAFT FEMORAL-TIBIAL ARTERY;  Surgeon: Mal Misty, MD;  Location: [place];  Service: Vascular;  Laterality: Left;  Redo  . FEMORAL-TIBIAL BYPASS GRAFT Left 09/30/2012   Procedure: REDO LEFT FEMORAL-ANTERIOR TIBIAL ARTERY BYPASS USING COMPOSITE CEPHALIC AND BASILIC VEIN GRAFT FROM LEFT ARM;  Surgeon: Mal Misty, MD;  Location: [place];  Service: Vascular;  Laterality: Left;  . FOOT SURGERY Right 03/20/2001   "have plates and screws in; didn't break it" (07/10/2012)  . GROIN DEBRIDEMENT Left 11/12/2012   Procedure: CLOSURE INGUINAL WOUND;  Surgeon: Mal Misty, MD;  Location: [place];  Service: Vascular;  Laterality: Left;  . I&D EXTREMITY Left 10/21/2012   Procedure: EXPLORATION AND DEBRIDEMENT OF LEFT GROIN WOUND;  Surgeon: Mal Misty, MD;  Location: [place];  Service: Vascular;  Laterality: Left;  . INSERT / REPLACE / REMOVE PACEMAKER  2007  . INTRAOPERATIVE ARTERIOGRAM  09/25/2011   Procedure: INTRA OPERATIVE ARTERIOGRAM;  Surgeon: Mal Misty, MD;  Location: [place];  Service: Vascular;  Laterality: Left;  . LEG AMPUTATION ABOVE KNEE Left 02/04/2013   DR LAWSON  . LOWER EXTREMITY ANGIOGRAM Left 12/25/2011   Procedure: LOWER EXTREMITY ANGIOGRAM;  Surgeon: Serafina Mitchell, MD;  Location: [facility] CATH LAB;  Service: Cardiovascular;  Laterality: Left;  lt leg angio co2  . LOWER EXTREMITY ANGIOGRAM N/A 07/07/2012   Procedure: LOWER EXTREMITY ANGIOGRAM;  Surgeon: Conrad Empire, MD;  Location: [facility] CATH LAB;  Service: Cardiovascular;  Laterality: N/A;  . LOWER EXTREMITY ANGIOGRAM N/A 09/15/2012   Procedure: LOWER EXTREMITY ANGIOGRAM;  Surgeon: Serafina Mitchell, MD;  Location: [facility] CATH LAB;  Service: Cardiovascular;   Laterality: N/A;  . PATCH ANGIOPLASTY Left 10/21/2012   Procedure: PATCH ANGIOPLASTY;  Surgeon: Mal Misty, MD;  Location: [place];  Service: Vascular;  Laterality: Left;  . PERIPHERAL VASCULAR CATHETERIZATION N/A 11/08/2015   Procedure: Abdominal Aortogram w/Lower Extremity;  Surgeon: Waynetta Sandy, MD;  Location: [place] CV LAB;  Service: Cardiovascular;  Laterality: N/A;  . REMOVAL OF GRAFT Left 04/16/2013   Procedure: I & D LEFT AKA WOUND, POSSIBLE REMOVAL OF INFECTED GORTEX GRAFT;  Surgeon: Mal Misty, MD;  Location: [place];  Service: Vascular;  Laterality: Left;  . RENAL ARTERY STENT  2009  . SHOULDER OPEN ROTATOR CUFF REPAIR Right 2001   repair  of lacerated right/notes 10/11/1999  (07/10/2012)  . TONSILLECTOMY    . TURP VAPORIZATION    . UPPER EXTREMITY ANGIOGRAM  09/15/2012   Procedure: UPPER EXTREMITY ANGIOGRAM;  Surgeon: Serafina Mitchell, MD;  Location: [facility] CATH LAB;  Service: Cardiovascular;;  . UVULOPALATOPHARYNGOPLASTY (UPPP)/TONSILLECTOMY/SEPTOPLASTY  06/30/2003   Archie Endo 06/30/2003 (07/10/2012)   Past Medical History:  Diagnosis Date  . Arthritis   . Atrial fibrillation ([place])   . Benign prostatic hypertrophy   . CAD (coronary artery disease)    a. s/p mid LAD stenting with DES 2008 Dr. Daneen Schick  . Chronic systolic dysfunction of left ventricle    a. mixed ischemic and nonischemic CM,  EF 35%. b. s/p AICD implantation.  . Constipation   . Depression   . ED (erectile dysfunction)   . Gout   . History of MRSA infection   . Hypertension    takes Carvedilol and Losartan daily  . Insomnia   . PAD (peripheral artery disease) (HCC)    s/p multiple LLE bypass grafts; left mid-distal SCA occlusion by 08/2012 duplex  . Renal artery stenosis ([place])    s/p stenting 2009  . Sleep apnea    hx of "had surgery for"  . Type II diabetes mellitus ([place])    There were no vitals taken for this visit.  Opioid Risk Score:   Fall Risk Score:  `1  Depression screen PHQ  2/9  Depression screen [facility] 2/9 05/01/2016 01/02/2016 07/10/2015 05/09/2015 11/23/2014 06/03/2014  Decreased Interest 1 1 0 2 2 2   Down, Depressed, Hopeless 0 0 0 1 1 1   PHQ - 2 Score 1 1 0 3 3 3   Altered sleeping - - - - - 3  Tired, decreased energy - - - - - 2  Change in appetite - - - - - 0  Feeling bad or failure about yourself  - - - - - 0  Trouble concentrating - - - - - 1  Moving slowly or fidgety/restless - - - - - 0  Suicidal thoughts - - - - - 0  PHQ-9 Score - - - - - 9  Some recent data might be hidden    Review of Systems  Constitutional: Positive for diaphoresis and unexpected weight change.  HENT: Negative.   Eyes: Negative.   Respiratory: Negative.   Cardiovascular: Negative.   Gastrointestinal: Negative.   Endocrine: Negative.   Genitourinary: Negative.   Musculoskeletal: Negative.   Skin: Negative.   Allergic/Immunologic: Negative.   Neurological: Negative.   Hematological: Negative.   Psychiatric/Behavioral: Negative.   All other systems reviewed and are negative.      Objective:   Physical Exam Constitutional: He is oriented to person, place, and time. He appears well-developed and well-nourished.  HENT:  Head: Normocephalic and atraumatic.  Neck: Normal range of motion. Neck supple.  Cardiovascular:RRR   Pulmonary/Chest: normal effort  Musculoskeletal:  Normal Muscle Bulk and Muscle Testing Reveals : Upper Extremities: Full ROM and Muscle Strength 5/5 Lower Extremities: Right: Full ROM and Muscle Strength 5/5 Left AKA: wearing Prosthesis : Stump well shaped. Movable nodule at distal femur which is tender Arrived in his scooter   Neurological: He is alert and oriented to person, place, and time.  Skin: Skin is warm and dry.  Psychiatric: He has a normal mood and affect.  Nursing note and vitals reviewed.         Assessment & Plan:     1.Hx of left AKA and now with developing  issues concerning there RLE/PAD -continue with ambulation at  home as tolerated -prosthetic adjustments per [facility] 2. Phantom limb pain/stump pain with potential neuroma- persistent pain -recommend MRI or CT of left stump to assess for neuroma. Consider follow up injection vs surgical consultation -continue gabapentin 300mg  tid. Hasn't been able to tolerate a higher dose or other anticonvulsants. -continue percocet to 10/325, q6 prn. #90. Second rx provided. We will continue the opioid monitoring program, this consists of regular clinic visits, examinations, urine drug screen, pill counts as well as use of [place] Controlled Substance Reporting System. [place] today.  UDS today -still can consider sympathetic ganglion block for LLE pending his RLE mgt  3. Mood/phantom pain- maintain cymbalta at 30mg daily 4. Cholecystitis---follow up with surgery this week.    Follow up with me in about 1 months. 40minutes of face to face patient care time were spent during this visit. All questions were encouraged and answered.

## 2016-06-19 NOTE — Patient Instructions (Addendum)
PLEASE FEEL FREE TO CALL OUR OFFICE WITH ANY PROBLEMS OR QUESTIONS ([phone])    CALL ME IF YOU WANT TO PURSUE FURTHER IMAGING OF YOUR LEFT LEG.

## 2016-06-21 DIAGNOSIS — R748 Abnormal levels of other serum enzymes: Secondary | ICD-10-CM | POA: Diagnosis not present

## 2016-06-23 LAB — TOXASSURE SELECT,+ANTIDEPR,UR

## 2016-06-24 DIAGNOSIS — E119 Type 2 diabetes mellitus without complications: Secondary | ICD-10-CM | POA: Diagnosis not present

## 2016-06-24 LAB — HM DIABETES EYE EXAM

## 2016-06-25 ENCOUNTER — Telehealth: Payer: Self-pay | Admitting: *Deleted

## 2016-06-25 NOTE — Telephone Encounter (Signed)
Issue formal written warning. He's either not taking these or taking them fast than prescribed. Further discrepancy will mean non-narcotic mgt only

## 2016-06-25 NOTE — Telephone Encounter (Signed)
Urine drug screen for this encounter does not show any drug or metabolite of his percocet. He did report that his last dose was 06/17/16 and the test was done 06/19/16, but according to toxicology resource something should be detected up to 4 days of taking medication. No parent drug or metabolite present. Therefore it is inconsistent.  This is not his first inconsistency.

## 2016-06-26 NOTE — Telephone Encounter (Signed)
A letter has been sent through [place] and will be mailed warning him if further UDS show no medication he will be discharged or made non narcotic management only.

## 2016-07-05 NOTE — Progress Notes (Signed)
No ICM remote transmission received for 07/05/2016 and next ICM transmission scheduled for 07/30/2016.

## 2016-07-08 ENCOUNTER — Ambulatory Visit: Payer: PPO | Admitting: General Practice

## 2016-07-08 DIAGNOSIS — I482 Chronic atrial fibrillation, unspecified: Secondary | ICD-10-CM

## 2016-07-08 LAB — POCT INR: INR: 2.6

## 2016-07-12 ENCOUNTER — Ambulatory Visit: Payer: PPO | Admitting: Family

## 2016-07-12 ENCOUNTER — Encounter (HOSPITAL_COMMUNITY): Payer: PPO

## 2016-07-17 ENCOUNTER — Other Ambulatory Visit: Payer: Self-pay | Admitting: Family Medicine

## 2016-07-26 ENCOUNTER — Ambulatory Visit: Payer: PPO | Admitting: Family Medicine

## 2016-07-26 ENCOUNTER — Encounter: Payer: Self-pay | Admitting: Family Medicine

## 2016-07-26 ENCOUNTER — Ambulatory Visit (INDEPENDENT_AMBULATORY_CARE_PROVIDER_SITE_OTHER): Payer: PPO | Admitting: Family Medicine

## 2016-07-26 VITALS — BP 137/74 | HR 64

## 2016-07-26 DIAGNOSIS — M79642 Pain in left hand: Secondary | ICD-10-CM

## 2016-07-26 DIAGNOSIS — M6281 Muscle weakness (generalized): Secondary | ICD-10-CM | POA: Diagnosis not present

## 2016-07-26 DIAGNOSIS — M79641 Pain in right hand: Secondary | ICD-10-CM | POA: Diagnosis not present

## 2016-07-26 DIAGNOSIS — N3281 Overactive bladder: Secondary | ICD-10-CM

## 2016-07-26 LAB — POC URINALSYSI DIPSTICK (AUTOMATED)
BILIRUBIN UA: NEGATIVE
GLUCOSE UA: NEGATIVE
Ketones, UA: NEGATIVE
Leukocytes, UA: NEGATIVE
Nitrite, UA: NEGATIVE
Protein, UA: NEGATIVE
RBC UA: NEGATIVE
SPEC GRAV UA: 1.015 (ref 1.010–1.025)
Urobilinogen, UA: 0.2 E.U./dL
pH, UA: 6 (ref 5.0–8.0)

## 2016-07-26 MED ORDER — METHYLPREDNISOLONE 4 MG PO TBPK: ORAL_TABLET | ORAL | 0 refills | Status: DC

## 2016-07-26 MED ORDER — TOLTERODINE TARTRATE 2 MG PO TABS: 2.0000 mg | ORAL_TABLET | Freq: Two times a day (BID) | ORAL | 5 refills | Status: DC

## 2016-07-26 NOTE — Addendum Note (Signed)
Addended by: Aggie Hacker A on: 07/26/2016 03:45 PM   Modules accepted: Orders

## 2016-07-26 NOTE — Progress Notes (Signed)
   Subjective:    Patient ID: [name], male    DOB: 1934-03-21, 82 y.o.   MRN: [id number]  HPI Here to follow up. He has a few issues to discuss. First he has had increased urinary urgency for a few months and occasional urge incontinence. No burning or pain. Also he describes some stiffness and pain in the hands, left more than right. He also describes occasional jerks of the hands. No continuous tremors. His HTN has been stable. His A1c at the [facility] clinic on 06-07-16 was excellent at 6.8. He still suffers from phantom limb pain in the left leg.   Review of Systems  Constitutional: Negative.   Respiratory: Negative.   Cardiovascular: Negative.   Genitourinary: Positive for frequency and urgency. Negative for dysuria, flank pain and hematuria.  Neurological: Negative for dizziness, tremors, seizures, syncope, facial asymmetry, speech difficulty, weakness, light-headedness, numbness and headaches.       Objective:   Physical Exam  Constitutional: He is oriented to person, place, and time. He appears well-developed and well-nourished.  In his scooter   Cardiovascular: Normal rate, regular rhythm, normal heart sounds and intact distal pulses.   Pulmonary/Chest: Effort normal and breath sounds normal.  Abdominal: Soft. Bowel sounds are normal. He exhibits no distension and no mass. There is no tenderness. There is no rebound and no guarding.  Musculoskeletal:  The left hand is tender in the MCP joints, no swelling   Neurological: He is alert and oriented to person, place, and time. A cranial nerve deficit is present. He exhibits normal muscle tone. Coordination normal.  No tremors or other movements seen in the hands          Assessment & Plan:  He is developing some arthritis in the hands so try a Medrol dose pack. He has some overactive bladder issues so try Detrol 2 mg bid. As for muscle weakness or twitches we will follow for now and he may need a Neurology referral at some  point.  [name], MD

## 2016-07-26 NOTE — Patient Instructions (Signed)
WE NOW OFFER   [facility] FAST TRACK!!!  SAME DAY Appointments for ACUTE CARE  Such as: Sprains, Injuries, cuts, abrasions, rashes, muscle pain, joint pain, back pain Colds, flu, sore throats, headache, allergies, cough, fever  Ear pain, sinus and eye infections Abdominal pain, nausea, vomiting, diarrhea, upset stomach Animal/insect bites  3 Easy Ways to Schedule: Walk-In Scheduling Call in scheduling Mychart Sign-up: [email or web address]         

## 2016-07-30 ENCOUNTER — Telehealth: Payer: Self-pay | Admitting: Cardiology

## 2016-07-30 ENCOUNTER — Telehealth: Payer: Self-pay | Admitting: Family Medicine

## 2016-07-30 NOTE — Telephone Encounter (Signed)
I agree with stopping the Tolteridine. Try Flomax 0.4 mg daily instead, call in #30 with 2 rf

## 2016-07-30 NOTE — Telephone Encounter (Signed)
Pt stopped the medication (tolterodine) due to it giving him a sore throat and unable to talk and thinks that it is counteracting with his LASIX.  Pt would like to have a call back.

## 2016-07-30 NOTE — Telephone Encounter (Signed)
LMOVM reminding pt to send remote transmission.   

## 2016-07-31 NOTE — Telephone Encounter (Signed)
I left a voice message for pt to return my call.  

## 2016-08-01 NOTE — Telephone Encounter (Signed)
Tried to process new order, yellow box appeared for drug allergy to sulfa. Can we send in new script?

## 2016-08-02 MED ORDER — TAMSULOSIN HCL 0.4 MG PO CAPS: 0.4000 mg | ORAL_CAPSULE | Freq: Every day | ORAL | 2 refills | Status: DC

## 2016-08-02 NOTE — Telephone Encounter (Signed)
I sent script e-scribe to [facility]. 

## 2016-08-02 NOTE — Telephone Encounter (Signed)
Yes that is not a problem. Process the Flomax

## 2016-08-06 NOTE — Progress Notes (Signed)
No ICM remote transmission received for 07/30/2016 due to waiting on new monitor and next ICM transmission scheduled for 08/20/2016.

## 2016-08-07 ENCOUNTER — Other Ambulatory Visit: Payer: Self-pay | Admitting: Family Medicine

## 2016-08-07 DIAGNOSIS — I482 Chronic atrial fibrillation, unspecified: Secondary | ICD-10-CM

## 2016-08-12 ENCOUNTER — Ambulatory Visit (INDEPENDENT_AMBULATORY_CARE_PROVIDER_SITE_OTHER): Payer: PPO | Admitting: General Practice

## 2016-08-12 DIAGNOSIS — I482 Chronic atrial fibrillation, unspecified: Secondary | ICD-10-CM

## 2016-08-12 LAB — POCT INR: INR: 3.6

## 2016-08-12 NOTE — Patient Instructions (Signed)
Pre visit review using our clinic review tool, if applicable. No additional management support is needed unless otherwise documented below in the visit note. 

## 2016-08-20 ENCOUNTER — Ambulatory Visit (INDEPENDENT_AMBULATORY_CARE_PROVIDER_SITE_OTHER): Payer: PPO

## 2016-08-20 ENCOUNTER — Telehealth: Payer: Self-pay | Admitting: Cardiology

## 2016-08-20 DIAGNOSIS — Z95810 Presence of automatic (implantable) cardiac defibrillator: Secondary | ICD-10-CM

## 2016-08-20 DIAGNOSIS — I5022 Chronic systolic (congestive) heart failure: Secondary | ICD-10-CM

## 2016-08-20 NOTE — Telephone Encounter (Signed)
Spoke with pt and reminded pt of remote transmission that is due today. Pt verbalized understanding.   

## 2016-08-22 NOTE — Progress Notes (Signed)
EPIC Encounter for ICM Monitoring  Patient Name: [name] is a 82 y.o. male Date: 08/22/2016 Primary Care Physican: Laurey Morale, MD Primary Cardiologist:Smith Electrophysiologist: Allred Dry Weight:Unknown - amputee Bi-V Pacing: 99.7%  Clinical Status (11-Apr-2016 to 20-Aug-2016) Treated VT/VF 0 episodes V. Pacing 99.7%  Lower Rate 60 bpm AT/AF 57 episodes  Atrial Pacing 0.0%  Upper Rate 120 bpm Time in AT/AF 24.0 hr/day (100.0%)  Longest AT/AF 15 days  Observations (2) (11-Apr-2016 to 20-Aug-2016)  AT/AF >= 6 hr for 132 days.  Patient Activity less than 1 hr/day for 15 weeks.      Heart Failure questions reviewed, pt asymptomatic.   Thoracic impedance normal.  Prescribed Furosemide 40 mg 1 tablet daily   Labs: 06/07/2016 Creatinine 2.4,   BUN 53, Potassium 4.4, Sodium 136 06/05/2016 Creatinine 2.67, BUN 61, Potassium 4.3, Sodium 136 12/29/2017Creatinine 1.93, BUN 45, Potassium 4.4, Sodium 139 02/19/2016 Creatinine 3.01, BUN 89, Potassium 4.9, Sodium 138  01/01/2016 Creatinine 2.05, BUN 39, Potassium 5.2, Sodium 141  11/08/2015 Creatinine 2.00, BUN 49, Potassium 5.3, Sodium 141  08/28/2015 Creatinine 1.75, BUN 26, Potassium 5.2, Sodium 141   Recommendations: No changes.   Encouraged to call for fluid symptoms.  Follow-up plan: ICM clinic phone appointment on 09/24/2016. Patient is overdue to make an appointment with Dr Rayann Heman  Copy of ICM check sent to device physician.   3 month ICM trend: 08/20/2016   1 Year ICM trend:      Rosalene Billings, RN 08/22/2016 9:34 AM

## 2016-09-05 ENCOUNTER — Encounter: Payer: PPO | Admitting: Family Medicine

## 2016-09-06 NOTE — Progress Notes (Signed)
Pre visit review using our clinic review tool, if applicable. No additional management support is needed unless otherwise documented below in the visit note. 

## 2016-09-06 NOTE — Progress Notes (Signed)
Subjective:   [name] is a 82 y.o. male who presents for an Initial Medicare Annual Wellness Visit.  Pt states that he has been experiencing bilateral arm pain x 8 months. States that he cannot finish shaving without having to take a rest.  Also reports left lower back pain intermittently.   Pt reports depression, denies suicidal plans. Refuses treatment.  Review of Systems  No ROS.  Medicare Wellness Visit. Additional risk factors are reflected in the social history.  Cardiac Risk Factors include: advanced age (>37men, >61 women);diabetes mellitus;hypertension Sleep patterns: Sometimes gets up 3-4 times/night. Occ naps during day. 4 hrs/night of sleep. Sometimes sleeps through the night.  Home Safety/Smoke Alarms: Feels safe in home. Smoke alarms in place.  Living environment; residence and Firearm Safety: Lives alone.  Seat Belt Safety/Bike Helmet: Wears seat belt.   Counseling:   Dental- Yearly.   Male:   CCS-    No record. PSA-  Lab Results  Component Value Date   PSA 0.68 08/28/2015   PSA 0.72 05/06/2012      Objective:    Today's Vitals   09/13/16 0848  BP: 118/60  Pulse: 67  Resp: 16  SpO2: 97%  Weight: 178 lb 14.4 oz (81.1 kg)  Height: 5\' 8"  (1.727 m)   Body mass index is 27.2 kg/m.  Current Medications (verified) Outpatient Encounter Prescriptions as of 09/13/2016  Medication Sig  . allopurinol (ZYLOPRIM) 100 MG tablet Take 1 tablet by mouth daily as needed for gout.  Marland Kitchen amLODipine (NORVASC) 5 MG tablet Take 1 tablet by mouth daily  . aspirin EC 81 MG tablet Take 81 mg by mouth daily.  . carvedilol (COREG) 25 MG tablet Take 1 tablet by mouth twice a day with a meal  . finasteride (PROSCAR) 5 MG tablet Take 1 tablet by mouth daily  . gabapentin (NEURONTIN) 300 MG capsule Take 2 capsules (600 mg total) by mouth 3 (three) times daily.  . insulin aspart protamine- aspart (NOVOLOG MIX 70/30) (70-30) 100 UNIT/ML injection Inject 5 Units into the  skin 2 (two) times daily with a meal.   . Insulin Pen Needle 31G X 8 MM MISC Use 2-3 times per day and diagnosis code is 250.00  . losartan (COZAAR) 100 MG tablet Take 1 tablet by mouth daily  . ONE TOUCH ULTRA TEST test strip USE TO TEST BLOOD SUGAR TWO TIMES DAILY AS INSTRUCTED BY PHYSICIAN.  . tamsulosin (FLOMAX) 0.4 MG CAPS capsule Take 1 capsule (0.4 mg total) by mouth daily.  Marland Kitchen warfarin (COUMADIN) 5 MG tablet TAKE AS DIRECTED BY ANTICOAGULATION CLINIC  . furosemide (LASIX) 40 MG tablet Take 1 tablet (40 mg total) by mouth daily.  . nitroGLYCERIN (NITROSTAT) 0.4 MG SL tablet Place 1 tablet (0.4 mg total) under the tongue every 5 (five) minutes as needed for chest pain. For chest pain, max 3 doses (Patient not taking: Reported on 07/26/2016)  . oxyCODONE-acetaminophen (PERCOCET) 10-325 MG tablet Take 1 tablet by mouth every 8 (eight) hours as needed for pain. May take an extra tablet when pain is severe. Only ten days out of the month  . [DISCONTINUED] methylPREDNISolone (MEDROL DOSEPAK) 4 MG TBPK tablet As directed   No facility-administered encounter medications on file as of 09/13/2016.     Allergies (verified) Other and Bactrim ds [sulfamethoxazole-trimethoprim]   History: Past Medical History:  Diagnosis Date  . Arthritis   . Atrial fibrillation ([place])   . Benign prostatic hypertrophy   . CAD (coronary  artery disease)    a. s/p mid LAD stenting with DES 2008 Dr. Daneen Schick  . Chronic systolic dysfunction of left ventricle    a. mixed ischemic and nonischemic CM,  EF 35%. b. s/p AICD implantation.  . Constipation   . Depression   . ED (erectile dysfunction)   . Gout   . History of MRSA infection   . Hypertension    takes Carvedilol and Losartan daily  . Insomnia   . PAD (peripheral artery disease) ([facility])    s/p multiple LLE bypass grafts; left mid-distal SCA occlusion by 08/2012 duplex  . Renal artery stenosis ([place])    s/p stenting 2009  . Sleep apnea    hx of "had surgery  for"  . Type II diabetes mellitus ([place])    Past Surgical History:  Procedure Laterality Date  . ABDOMINAL ANGIOGRAM  12/25/2011   Procedure: ABDOMINAL ANGIOGRAM;  Surgeon: Serafina Mitchell, MD;  Location: [facility] CATH LAB;  Service: Cardiovascular;;  . ABDOMINAL AORTAGRAM N/A 09/11/2011   Procedure: ABDOMINAL Maxcine Ham;  Surgeon: Wellington Hampshire, MD;  Location: [place] CATH LAB;  Service: Cardiovascular;  Laterality: N/A;  . ADENOIDECTOMY     Hx; of   . AMPUTATION Left 02/04/2013   Procedure: AMPUTATION ABOVE KNEE-LEFT;  Surgeon: Mal Misty, MD;  Location: [place];  Service: Vascular;  Laterality: Left;  . BICEPS TENDON REPAIR Right 2001   Archie Endo 03/20/2001 (07/10/2012)  . BIV ICD GENERTAOR CHANGE OUT N/A 11/09/2013   Procedure: BIV ICD GENERTAOR CHANGE OUT;  Surgeon: Coralyn Mark, MD;  Location: [facility] CATH LAB;  Service: Cardiovascular;  Laterality: N/A;  . CARDIAC DEFIBRILLATOR PLACEMENT  12/26/09   pacemaker combo  . CARPAL TUNNEL RELEASE Right 2002   Archie Endo 03/20/2001 (07/10/2012)  . cervical epidural injection  2013  . COLONOSCOPY     Hx; of  . CORONARY ANGIOPLASTY    . CORONARY ANGIOPLASTY WITH STENT PLACEMENT  ~ 2000  . EMBOLECTOMY  02/07/2012   Procedure: EMBOLECTOMY;  Surgeon: Rosetta Posner, MD;  Location: [place];  Service: Vascular;  Laterality: Left;  . EMBOLECTOMY Left 12/16/2012   Procedure: THROMBECTOMY  LEFT LEG BYPASS;  Surgeon: Elam Dutch, MD;  Location: [place];  Service: Vascular;  Laterality: Left;  . FEMORAL-POPLITEAL BYPASS GRAFT Left 09/16/2012   Procedure: LEFT FEMORAL-POPLITEAL BYPASS GRAFT WITH GORTEX Propaten Graft 6x80 Thin Wall and Left lower leg Angiogram;  Surgeon: Mal Misty, MD;  Location: [place];  Service: Vascular;  Laterality: Left;  . FEMORAL-TIBIAL BYPASS GRAFT  09/25/2011   Procedure: BYPASS GRAFT FEMORAL-TIBIAL ARTERY;  Surgeon: Mal Misty, MD;  Location: [facility] OR;  Service: Vascular;  Laterality: Left;  Left Femoral - Anterior Tibial Bypass;  saphenous vein  graft from left leg  . FEMORAL-TIBIAL BYPASS GRAFT  02/07/2012   Procedure: BYPASS GRAFT FEMORAL-TIBIAL ARTERY;  Surgeon: Rosetta Posner, MD;  Location: [place];  Service: Vascular;  Laterality: Left;  Thrombectomy Left Femoral - Tibial Bypass Graft  . FEMORAL-TIBIAL BYPASS GRAFT  04/03/2012   Procedure: BYPASS GRAFT FEMORAL-TIBIAL ARTERY;  Surgeon: Mal Misty, MD;  Location: [place];  Service: Vascular;  Laterality: Left;  Redo  . FEMORAL-TIBIAL BYPASS GRAFT Left 09/30/2012   Procedure: REDO LEFT FEMORAL-ANTERIOR TIBIAL ARTERY BYPASS USING COMPOSITE CEPHALIC AND BASILIC VEIN GRAFT FROM LEFT ARM;  Surgeon: Mal Misty, MD;  Location: [place];  Service: Vascular;  Laterality: Left;  . FOOT SURGERY Right 03/20/2001   "have plates and screws  in; didn't break it" (07/10/2012)  . GROIN DEBRIDEMENT Left 11/12/2012   Procedure: CLOSURE INGUINAL WOUND;  Surgeon: Mal Misty, MD;  Location: [place];  Service: Vascular;  Laterality: Left;  . I&D EXTREMITY Left 10/21/2012   Procedure: EXPLORATION AND DEBRIDEMENT OF LEFT GROIN WOUND;  Surgeon: Mal Misty, MD;  Location: [place];  Service: Vascular;  Laterality: Left;  . INSERT / REPLACE / REMOVE PACEMAKER  2007  . INTRAOPERATIVE ARTERIOGRAM  09/25/2011   Procedure: INTRA OPERATIVE ARTERIOGRAM;  Surgeon: Mal Misty, MD;  Location: [place];  Service: Vascular;  Laterality: Left;  . LEG AMPUTATION ABOVE KNEE Left 02/04/2013   DR LAWSON  . LOWER EXTREMITY ANGIOGRAM Left 12/25/2011   Procedure: LOWER EXTREMITY ANGIOGRAM;  Surgeon: Serafina Mitchell, MD;  Location: [facility] CATH LAB;  Service: Cardiovascular;  Laterality: Left;  lt leg angio co2  . LOWER EXTREMITY ANGIOGRAM N/A 07/07/2012   Procedure: LOWER EXTREMITY ANGIOGRAM;  Surgeon: Conrad Cove, MD;  Location: [facility] CATH LAB;  Service: Cardiovascular;  Laterality: N/A;  . LOWER EXTREMITY ANGIOGRAM N/A 09/15/2012   Procedure: LOWER EXTREMITY ANGIOGRAM;  Surgeon: Serafina Mitchell, MD;  Location: [facility] CATH LAB;  Service:  Cardiovascular;  Laterality: N/A;  . PATCH ANGIOPLASTY Left 10/21/2012   Procedure: PATCH ANGIOPLASTY;  Surgeon: Mal Misty, MD;  Location: [place];  Service: Vascular;  Laterality: Left;  . PERIPHERAL VASCULAR CATHETERIZATION N/A 11/08/2015   Procedure: Abdominal Aortogram w/Lower Extremity;  Surgeon: Waynetta Sandy, MD;  Location: [place] CV LAB;  Service: Cardiovascular;  Laterality: N/A;  . REMOVAL OF GRAFT Left 04/16/2013   Procedure: I & D LEFT AKA WOUND, POSSIBLE REMOVAL OF INFECTED GORTEX GRAFT;  Surgeon: Mal Misty, MD;  Location: [place];  Service: Vascular;  Laterality: Left;  . RENAL ARTERY STENT  2009  . SHOULDER OPEN ROTATOR CUFF REPAIR Right 2001   repair of lacerated right/notes 10/11/1999  (07/10/2012)  . TONSILLECTOMY    . TURP VAPORIZATION    . UPPER EXTREMITY ANGIOGRAM  09/15/2012   Procedure: UPPER EXTREMITY ANGIOGRAM;  Surgeon: Serafina Mitchell, MD;  Location: [facility] CATH LAB;  Service: Cardiovascular;;  . UVULOPALATOPHARYNGOPLASTY (UPPP)/TONSILLECTOMY/SEPTOPLASTY  06/30/2003   Archie Endo 06/30/2003 (07/10/2012)   Family History  Problem Relation Age of Onset  . Cancer Father   . Cancer Unknown        breast/fhx  . Heart disease Unknown        fhx  . Diabetes Neg Hx    Social History   Occupational History  . Retired Retired   Social History Main Topics  . Smoking status: Current Some Day Smoker    Types: Cigars  . Smokeless tobacco: Never Used     Comment: 1 cigar each month  . Alcohol use 1.8 oz/week    3 Glasses of wine per week  . Drug use: No  . Sexual activity: Not Currently   Tobacco Counseling Ready to quit: Not Answered Counseling given: Not Answered   Activities of Daily Living In your present state of health, do you have any difficulty performing the following activities: 09/13/2016 11/08/2015  Hearing? N N  Vision? N N  Difficulty concentrating or making decisions? N N  Walking or climbing stairs? Y Y  Dressing or bathing? N N  Doing  errands, shopping? N -  Preparing Food and eating ? N -  Using the Toilet? N -  In the past six months, have you accidently leaked urine? N -  Do you have problems with loss of bowel control? N -  Managing your Medications? N -  Managing your Finances? N -  Housekeeping or managing your Housekeeping? N -  Some recent data might be hidden    Immunizations and Health Maintenance Immunization History  Administered Date(s) Administered  . Influenza Split 12/09/2011  . Influenza Whole 12/04/2009  . Influenza, High Dose Seasonal PF 12/14/2012, 12/16/2013, 03/02/2015, 03/11/2016  . Influenza-Unspecified 12/03/2010  . PPD Test 03/01/2013  . Pneumococcal Conjugate-13 09/04/2015  . Pneumococcal Polysaccharide-23 03/04/2009  . Td 03/04/2008   Health Maintenance Due  Topic Date Due  . FOOT EXAM  03/17/1944  . HEMOGLOBIN A1C  02/27/2016  . OPHTHALMOLOGY EXAM  06/19/2016    Patient Care Team: Laurey Morale, MD as PCP - General  Indicate any recent Medical Services you may have received from other than [facility] providers in the past year (date may be approximate).    Assessment:   This is a routine wellness examination for [name]. Physical assessment deferred to PCP.   Hearing/Vision screen Hearing Screening Comments: Right ear hearing difficulty. Cannot afford hearing aids  Vision Screening Comments: [facility].   Dietary issues and exercise activities discussed: Current Exercise Habits: Structured exercise class, Type of exercise: strength training/weights;stretching (Silver Sneakers), Time (Minutes): 60, Frequency (Times/Week): 2, Weekly Exercise (Minutes/Week): 120, Intensity: Moderate Diet (meal preparation, eat out, water intake, caffeinated beverages, dairy products, fruits and vegetables): 2-3 meals/day. Pt gets meals on wheels and cooks himself. Drinks 1-2 glasses water/day. 1 cup coffee in morning. Drinks OJ.  Goals    None     Depression Screen PHQ 2/9 Scores 09/13/2016  05/01/2016 01/02/2016 07/10/2015  PHQ - 2 Score 4 1 1  0  PHQ- 9 Score 17 - - -  Exception Documentation - - - -  Not completed - - - -    Fall Risk Fall Risk  09/13/2016 05/24/2016 05/01/2016 04/01/2016 01/02/2016  Falls in the past year? No No No No No  Risk for fall due to : - - - - -    Cognitive Function:   Ad8 score reviewed for issues:  Issues making decisions:no  Less interest in hobbies / activities:no  Repeats questions, stories (family complaining):no  Trouble using ordinary gadgets (microwave, computer, phone):no  Forgets the month or year: no  Mismanaging finances: no  Remembering appts:no  Daily problems with thinking and/or memory:no Ad8 score is=0          Screening Tests Health Maintenance  Topic Date Due  . FOOT EXAM  03/17/1944  . HEMOGLOBIN A1C  02/27/2016  . OPHTHALMOLOGY EXAM  06/19/2016  . INFLUENZA VACCINE  10/02/2016  . TETANUS/TDAP  03/04/2018  . PNA vac Low Risk Adult  Completed        Plan:  Follow up with PCP as directed.  Bring a copy of your advance directives to your next office visit.  Diabetic eye exam request from [facility].   I have personally reviewed and noted the following in the patient's chart:   . Medical and social history . Use of alcohol, tobacco or illicit drugs  . Current medications and supplements . Functional ability and status . Nutritional status . Physical activity . Advanced directives . List of other physicians . Vitals . Screenings to include cognitive, depression, and falls . Referrals and appointments  In addition, I have reviewed and discussed with patient certain preventive protocols, quality metrics, and best practice recommendations. A written personalized care plan  for preventive services as well as general preventive health recommendations were provided to patient.     Ree Edman, RN   09/13/2016   I have read this note and agree with its contents.  [name],  MD

## 2016-09-13 ENCOUNTER — Ambulatory Visit (INDEPENDENT_AMBULATORY_CARE_PROVIDER_SITE_OTHER): Payer: PPO | Admitting: Family Medicine

## 2016-09-13 ENCOUNTER — Ambulatory Visit (INDEPENDENT_AMBULATORY_CARE_PROVIDER_SITE_OTHER): Payer: PPO | Admitting: General Practice

## 2016-09-13 ENCOUNTER — Encounter: Payer: Self-pay | Admitting: Family Medicine

## 2016-09-13 VITALS — BP 118/60 | HR 67 | Temp 97.8°F | Resp 16 | Ht 68.0 in | Wt 178.9 lb

## 2016-09-13 DIAGNOSIS — I482 Chronic atrial fibrillation, unspecified: Secondary | ICD-10-CM

## 2016-09-13 DIAGNOSIS — Z794 Long term (current) use of insulin: Secondary | ICD-10-CM

## 2016-09-13 DIAGNOSIS — N184 Chronic kidney disease, stage 4 (severe): Secondary | ICD-10-CM

## 2016-09-13 DIAGNOSIS — Z0000 Encounter for general adult medical examination without abnormal findings: Secondary | ICD-10-CM | POA: Diagnosis not present

## 2016-09-13 DIAGNOSIS — E1165 Type 2 diabetes mellitus with hyperglycemia: Secondary | ICD-10-CM

## 2016-09-13 DIAGNOSIS — E1122 Type 2 diabetes mellitus with diabetic chronic kidney disease: Secondary | ICD-10-CM

## 2016-09-13 DIAGNOSIS — G546 Phantom limb syndrome with pain: Secondary | ICD-10-CM | POA: Diagnosis not present

## 2016-09-13 DIAGNOSIS — M1000 Idiopathic gout, unspecified site: Secondary | ICD-10-CM | POA: Diagnosis not present

## 2016-09-13 DIAGNOSIS — IMO0002 Reserved for concepts with insufficient information to code with codable children: Secondary | ICD-10-CM

## 2016-09-13 LAB — POCT INR: INR: 1.8

## 2016-09-13 LAB — BASIC METABOLIC PANEL
BUN: 34 mg/dL — ABNORMAL HIGH (ref 6–23)
CALCIUM: 9.3 mg/dL (ref 8.4–10.5)
CO2: 25 meq/L (ref 19–32)
Chloride: 108 mEq/L (ref 96–112)
Creatinine, Ser: 1.96 mg/dL — ABNORMAL HIGH (ref 0.40–1.50)
GFR: 35.11 mL/min — AB (ref >60.00)
Glucose, Bld: 91 mg/dL (ref 70–99)
POTASSIUM: 5.6 meq/L — AB (ref 3.5–5.1)
Sodium: 141 mEq/L (ref 135–145)

## 2016-09-13 LAB — URIC ACID: Uric Acid, Serum: 5.9 mg/dL (ref 4.0–7.8)

## 2016-09-13 LAB — CBC WITH DIFFERENTIAL/PLATELET
Basophils Absolute: 0.1 10*3/uL (ref 0.0–0.1)
Basophils Relative: 1 % (ref 0.0–3.0)
Eosinophils Absolute: 0.2 10*3/uL (ref 0.0–0.7)
Eosinophils Relative: 2.7 % (ref 0.0–5.0)
HCT: 40.3 % (ref 39.0–52.0)
Hemoglobin: 13.2 g/dL (ref 13.0–17.0)
Lymphocytes Relative: 18.3 % (ref 12.0–46.0)
Lymphs Abs: 1.3 10*3/uL (ref 0.7–4.0)
MCHC: 32.8 g/dL (ref 30.0–36.0)
MCV: 84.4 fl (ref 78.0–100.0)
Monocytes Absolute: 0.7 10*3/uL (ref 0.1–1.0)
Monocytes Relative: 9.5 % (ref 3.0–12.0)
Neutro Abs: 4.8 10*3/uL (ref 1.4–7.7)
Neutrophils Relative %: 68.5 % (ref 43.0–77.0)
Platelets: 143 10*3/uL — ABNORMAL LOW (ref 150.0–400.0)
RBC: 4.78 Mil/uL (ref 4.22–5.81)
RDW: 16.7 % — ABNORMAL HIGH (ref 11.5–15.5)
WBC: 7.1 10*3/uL (ref 4.0–10.5)

## 2016-09-13 LAB — POC URINALSYSI DIPSTICK (AUTOMATED)
Bilirubin, UA: NEGATIVE
Blood, UA: NEGATIVE
Glucose, UA: NEGATIVE
Ketones, UA: NEGATIVE
Leukocytes, UA: NEGATIVE
Nitrite, UA: NEGATIVE
Protein, UA: NEGATIVE
Spec Grav, UA: 1.015 (ref 1.010–1.025)
Urobilinogen, UA: 0.2 E.U./dL
pH, UA: 6 (ref 5.0–8.0)

## 2016-09-13 LAB — HEMOGLOBIN A1C: Hgb A1c MFr Bld: 7 % — ABNORMAL HIGH (ref 4.6–6.5)

## 2016-09-13 LAB — PSA: PSA: 0.45 ng/mL (ref 0.10–4.00)

## 2016-09-13 LAB — HEPATIC FUNCTION PANEL
ALT: 11 U/L (ref 0–53)
AST: 13 U/L (ref 0–37)
Albumin: 4 g/dL (ref 3.5–5.2)
Alkaline Phosphatase: 59 U/L (ref 39–117)
BILIRUBIN DIRECT: 0.2 mg/dL (ref 0.0–0.3)
BILIRUBIN TOTAL: 1.2 mg/dL (ref 0.2–1.2)
Total Protein: 6.6 g/dL (ref 6.0–8.3)

## 2016-09-13 LAB — LIPID PANEL
Cholesterol: 213 mg/dL — ABNORMAL HIGH (ref 0–200)
HDL: 43.8 mg/dL (ref >39.00)
LDL Cholesterol: 146 mg/dL — ABNORMAL HIGH (ref 0–99)
NONHDL: 169.48
Total CHOL/HDL Ratio: 5
Triglycerides: 117 mg/dL (ref 0.0–149.0)
VLDL: 23.4 mg/dL (ref 0.0–40.0)

## 2016-09-13 LAB — TSH: TSH: 3.12 u[IU]/mL (ref 0.35–4.50)

## 2016-09-13 MED ORDER — OXYCODONE-ACETAMINOPHEN 10-325 MG PO TABS: 1.0000 | ORAL_TABLET | Freq: Four times a day (QID) | ORAL | 0 refills | Status: DC | PRN

## 2016-09-13 NOTE — Progress Notes (Signed)
   Subjective:    Patient ID: [name], male    DOB: 1934-05-05, 82 y.o.   MRN: [id number]  HPI Here for a wellness exam. He feels fairly well although the arthritis pain in his hands bothers him. His BP is stable. His am fasting glucoses run 130-150.    Review of Systems  Constitutional: Negative.   HENT: Negative.   Eyes: Negative.   Respiratory: Negative.   Cardiovascular: Negative.   Gastrointestinal: Negative.   Genitourinary: Negative.   Musculoskeletal: Positive for arthralgias. Negative for joint swelling.  Skin: Negative.   Neurological: Negative.   Psychiatric/Behavioral: Negative.        Objective:   Physical Exam  Constitutional: He is oriented to person, place, and time. He appears well-developed and well-nourished. No distress.  HENT:  Head: Normocephalic and atraumatic.  Right Ear: External ear normal.  Left Ear: External ear normal.  Nose: Nose normal.  Mouth/Throat: Oropharynx is clear and moist. No oropharyngeal exudate.  Eyes: Pupils are equal, round, and reactive to light. Conjunctivae and EOM are normal. Right eye exhibits no discharge. Left eye exhibits no discharge. No scleral icterus.  Neck: Neck supple. No JVD present. No tracheal deviation present. No thyromegaly present.  Cardiovascular: Normal rate, regular rhythm, normal heart sounds and intact distal pulses.  Exam reveals no gallop and no friction rub.   No murmur heard. Pulmonary/Chest: Effort normal and breath sounds normal. No respiratory distress. He has no wheezes. He has no rales. He exhibits no tenderness.  Abdominal: Soft. Bowel sounds are normal. He exhibits no distension and no mass. There is no tenderness. There is no rebound and no guarding.  Genitourinary: Rectum normal, prostate normal and penis normal. Rectal exam shows guaiac negative stool. No penile tenderness.  Musculoskeletal: Normal range of motion. He exhibits no edema or tenderness.  Lymphadenopathy:    He has no  cervical adenopathy.  Neurological: He is alert and oriented to person, place, and time. He has normal reflexes. No cranial nerve deficit. He exhibits normal muscle tone. Coordination normal.  Skin: Skin is warm and dry. No rash noted. He is not diaphoretic. No erythema. No pallor.  Psychiatric: He has a normal mood and affect. His behavior is normal. Judgment and thought content normal.          Assessment & Plan:  Well exam. We discussed diet and exercise. Get fasting labs including an A1c.  [name], MD

## 2016-09-13 NOTE — Patient Instructions (Signed)
Bring a copy of your advance directives to your next office visit. Start doing brain stimulating activities (puzzles (word finds and Sudoku), reading, adult coloring books, staying active) to keep memory sharp.

## 2016-09-13 NOTE — Patient Instructions (Signed)
Pre visit review using our clinic review tool, if applicable. No additional management support is needed unless otherwise documented below in the visit note. 

## 2016-09-20 ENCOUNTER — Other Ambulatory Visit: Payer: Self-pay | Admitting: General Practice

## 2016-09-20 ENCOUNTER — Telehealth: Payer: Self-pay | Admitting: Family Medicine

## 2016-09-20 MED ORDER — WARFARIN SODIUM 5 MG PO TABS: ORAL_TABLET | ORAL | 1 refills | Status: DC

## 2016-09-20 NOTE — Telephone Encounter (Signed)
Noted.  Re-sent.

## 2016-09-20 NOTE — Telephone Encounter (Signed)
Refill request for Coumadin and send to [facility], they must have specific directions.

## 2016-09-20 NOTE — Telephone Encounter (Signed)
I routed this request to [facility], she will handle Coumadin refill.

## 2016-09-23 NOTE — Telephone Encounter (Signed)
Looks like script was sent e-scribe.

## 2016-09-24 ENCOUNTER — Ambulatory Visit (INDEPENDENT_AMBULATORY_CARE_PROVIDER_SITE_OTHER): Payer: PPO | Admitting: *Deleted

## 2016-09-24 ENCOUNTER — Telehealth: Payer: Self-pay | Admitting: Cardiology

## 2016-09-24 DIAGNOSIS — I255 Ischemic cardiomyopathy: Secondary | ICD-10-CM | POA: Diagnosis not present

## 2016-09-24 DIAGNOSIS — Z95810 Presence of automatic (implantable) cardiac defibrillator: Secondary | ICD-10-CM

## 2016-09-24 DIAGNOSIS — I5022 Chronic systolic (congestive) heart failure: Secondary | ICD-10-CM | POA: Diagnosis not present

## 2016-09-24 NOTE — Telephone Encounter (Signed)
Spoke with pt and reminded pt of remote transmission that is due today. Pt verbalized understanding.   

## 2016-09-27 ENCOUNTER — Other Ambulatory Visit: Payer: Self-pay | Admitting: General Practice

## 2016-09-27 ENCOUNTER — Telehealth: Payer: Self-pay

## 2016-09-27 ENCOUNTER — Telehealth: Payer: Self-pay | Admitting: Family Medicine

## 2016-09-27 ENCOUNTER — Encounter: Payer: Self-pay | Admitting: Cardiology

## 2016-09-27 NOTE — Telephone Encounter (Signed)
° ° ° °  Pt said he spoke with [facility] and they do not have the below can we resend please     warfarin (COUMADIN) 5 MG tablet

## 2016-09-27 NOTE — Telephone Encounter (Signed)
Remote ICM transmission received.  Attempted patient call and left detailed message regarding transmission and next ICM scheduled for 10/28/2016.  Advised to return call for any fluid symptoms or questions.    

## 2016-09-27 NOTE — Telephone Encounter (Signed)
I called pharmacy.  They do have the request but I had to OK a manufacturer change.  Medication is being sent out.

## 2016-09-27 NOTE — Progress Notes (Signed)
EPIC Encounter for ICM Monitoring  Patient Name: [name] is a 82 y.o. male Date: 09/27/2016 Primary Care Physican: Laurey Morale, MD Primary Cardiologist:Smith Electrophysiologist: Allred Dry Weight:178 lbs last office visit Bi-V Pacing: 99.8%       Attempted call to patient and unable to reach.  Left detailed message regarding transmission.  Transmission reviewed.    Thoracic impedance normal.  Prescribed dosage: Furosemide 40 mg 1 tablet daily   Labs: 09/13/2016 Creatinine 1.96, BUN 34, Potassium 5.6, Sodium 141 06/07/2016 Creatinine 2.4,   BUN 53, Potassium 4.4, Sodium 136 06/05/2016 Creatinine 2.67, BUN 61, Potassium 4.3, Sodium 136 12/29/2017Creatinine 1.93, BUN 45, Potassium 4.4, Sodium 139 02/19/2016 Creatinine 3.01, BUN 89, Potassium 4.9, Sodium 138  01/01/2016 Creatinine 2.05, BUN 39, Potassium 5.2, Sodium 141  11/08/2015 Creatinine 2.00, BUN 49, Potassium 5.3, Sodium 141  08/28/2015 Creatinine 1.75, BUN 26, Potassium 5.2, Sodium 141   Recommendations: Left voice mail with ICM number and encouraged to call for fluid symptoms.  Follow-up plan: ICM clinic phone appointment on 10/28/2016.    Copy of ICM check sent to device physician.   3 month ICM trend: 09/27/2016   1 Year ICM trend:      Rosalene Billings, RN 09/27/2016 9:39 AM

## 2016-10-11 ENCOUNTER — Telehealth: Payer: Self-pay | Admitting: Family Medicine

## 2016-10-11 DIAGNOSIS — G546 Phantom limb syndrome with pain: Secondary | ICD-10-CM

## 2016-10-11 MED ORDER — OXYCODONE-ACETAMINOPHEN 10-325 MG PO TABS: 1.0000 | ORAL_TABLET | Freq: Four times a day (QID) | ORAL | 0 refills | Status: DC | PRN

## 2016-10-11 NOTE — Telephone Encounter (Signed)
Pt needs new rx oxycodone. Pt is aware may take up to 3 business days

## 2016-10-11 NOTE — Telephone Encounter (Signed)
done

## 2016-10-11 NOTE — Telephone Encounter (Signed)
Script is ready for pick up here at front office and I spoke with pt.  

## 2016-10-15 ENCOUNTER — Telehealth: Payer: Self-pay | Admitting: Internal Medicine

## 2016-10-15 NOTE — Telephone Encounter (Signed)
New message    Pt is calling to speak with Dr. Rayann Heman RN. He said he has pain in his left arm.

## 2016-10-15 NOTE — Telephone Encounter (Signed)
Not sure this is anything with the heart. See PCP sooner than 8/20 if possible.

## 2016-10-15 NOTE — Telephone Encounter (Signed)
Last night around 7:30 his left arm started tingling that lasted 2 hours.  He denies pain and SOB.  He took one NTG with no relief and then a second NTG 7-8 mons later and the pain subsided 15 min later.  He went to bed and felt good today until around 1:30pm.  He was getting ready to have lunch and broke out is a sweat.  He did not take NTG today and it took about an hour for this to go away.  He feels fine now.  Was due to see Dr. Tamala Julian in June.  I will forward to his nurse to seeif she can work him in for an appointment.

## 2016-10-15 NOTE — Telephone Encounter (Signed)
Scheduled pt to see Dr. Tamala Julian 8/20 to see Dr. Tamala Julian.  No APP appts available this week.  Advised pt when appropriate to proceed to ER.  Will route to Dr. Tamala Julian to see if further recommendations.

## 2016-10-15 NOTE — Telephone Encounter (Signed)
Follow Up:    Pt called to see if he had given [place] the correct phone number.

## 2016-10-15 NOTE — Telephone Encounter (Signed)
Spoke with pt and he will contact PCP to see if he can get in sooner.  Pt leaving to go out of town Fri and will not be back until late Mon.  Moved appt to Tuesday with Dr. Tamala Julian.

## 2016-10-21 ENCOUNTER — Ambulatory Visit: Payer: PPO | Admitting: Interventional Cardiology

## 2016-10-22 ENCOUNTER — Ambulatory Visit (INDEPENDENT_AMBULATORY_CARE_PROVIDER_SITE_OTHER): Payer: PPO | Admitting: Interventional Cardiology

## 2016-10-22 ENCOUNTER — Encounter: Payer: Self-pay | Admitting: Interventional Cardiology

## 2016-10-22 VITALS — BP 104/68 | HR 63 | Ht 68.0 in | Wt 181.8 lb

## 2016-10-22 DIAGNOSIS — E1165 Type 2 diabetes mellitus with hyperglycemia: Secondary | ICD-10-CM

## 2016-10-22 DIAGNOSIS — I5022 Chronic systolic (congestive) heart failure: Secondary | ICD-10-CM | POA: Diagnosis not present

## 2016-10-22 DIAGNOSIS — I25729 Atherosclerosis of autologous artery coronary artery bypass graft(s) with unspecified angina pectoris: Secondary | ICD-10-CM | POA: Diagnosis not present

## 2016-10-22 DIAGNOSIS — I10 Essential (primary) hypertension: Secondary | ICD-10-CM | POA: Diagnosis not present

## 2016-10-22 DIAGNOSIS — E1122 Type 2 diabetes mellitus with diabetic chronic kidney disease: Secondary | ICD-10-CM

## 2016-10-22 DIAGNOSIS — I739 Peripheral vascular disease, unspecified: Secondary | ICD-10-CM

## 2016-10-22 DIAGNOSIS — Z794 Long term (current) use of insulin: Secondary | ICD-10-CM

## 2016-10-22 DIAGNOSIS — I482 Chronic atrial fibrillation, unspecified: Secondary | ICD-10-CM

## 2016-10-22 DIAGNOSIS — IMO0002 Reserved for concepts with insufficient information to code with codable children: Secondary | ICD-10-CM

## 2016-10-22 DIAGNOSIS — N184 Chronic kidney disease, stage 4 (severe): Secondary | ICD-10-CM | POA: Diagnosis not present

## 2016-10-22 NOTE — Progress Notes (Signed)
Cardiology Office Note    Date:  10/22/2016   ID:  [name], DOB 12-29-1934, MRN [id number]  PCP:  Laurey Morale, MD  Cardiologist: Sinclair Grooms, MD   Chief Complaint  Patient presents with  . Coronary Artery Disease  . Congestive Heart Failure    History of Present Illness:  [name] is a 82 y.o. 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  He denies angina. There's been no significant lower extremity swelling. He is not very compliant with medical therapy including diuretics. He will like to decrease the diuretic intensity. We discussed the reason he is now better compensated from heart failure point of view is consistent diuretic. He denies angina, syncope, AICD discharge   Past Medical History:  Diagnosis Date  . Arthritis   . Atrial fibrillation ([place])   . Benign prostatic hypertrophy   . CAD (coronary artery disease)    a. s/p mid LAD stenting with DES 2008 Dr. Daneen Schick  . Chronic systolic dysfunction of left ventricle    a. mixed ischemic and nonischemic CM,  EF 35%. b. s/p AICD implantation.  . Constipation   . Depression   . ED (erectile dysfunction)   . Gout   . History of MRSA infection   . Hypertension    takes Carvedilol and Losartan daily  . Insomnia   . PAD (peripheral artery disease) (HCC)    s/p multiple LLE bypass grafts; left mid-distal SCA occlusion by 08/2012 duplex  . Renal artery stenosis ([place])    s/p stenting 2009  . Sleep apnea    hx of "had surgery for"  . Type II diabetes mellitus ([place])     Past Surgical History:  Procedure Laterality Date  . ABDOMINAL ANGIOGRAM  12/25/2011   Procedure: ABDOMINAL ANGIOGRAM;  Surgeon: Serafina Mitchell, MD;  Location: [facility] CATH LAB;  Service: Cardiovascular;;  . ABDOMINAL AORTAGRAM N/A 09/11/2011   Procedure: ABDOMINAL Maxcine Ham;  Surgeon: Wellington Hampshire, MD;  Location: [facility] CATH LAB;  Service: Cardiovascular;  Laterality: N/A;  . ADENOIDECTOMY     Hx; of   . AMPUTATION Left 02/04/2013   Procedure: AMPUTATION ABOVE KNEE-LEFT;  Surgeon: Mal Misty, MD;  Location: [place];  Service: Vascular;  Laterality: Left;  . BICEPS TENDON REPAIR Right 2001   Archie Endo 03/20/2001 (07/10/2012)  . BIV ICD GENERTAOR CHANGE OUT N/A 11/09/2013   Procedure: BIV ICD GENERTAOR CHANGE OUT;  Surgeon: Coralyn Mark, MD;  Location: [facility] CATH LAB;  Service: Cardiovascular;  Laterality: N/A;  . CARDIAC DEFIBRILLATOR PLACEMENT  12/26/09   pacemaker combo  . CARPAL TUNNEL RELEASE Right 2002   Archie Endo 03/20/2001 (07/10/2012)  . cervical epidural injection  2013  . COLONOSCOPY     Hx; of  . CORONARY ANGIOPLASTY    . CORONARY ANGIOPLASTY WITH STENT PLACEMENT  ~ 2000  . EMBOLECTOMY  02/07/2012   Procedure: EMBOLECTOMY;  Surgeon: Rosetta Posner, MD;  Location: [place];  Service: Vascular;  Laterality: Left;  . EMBOLECTOMY Left 12/16/2012   Procedure: THROMBECTOMY  LEFT LEG BYPASS;  Surgeon: Elam Dutch, MD;  Location: [place];  Service: Vascular;  Laterality: Left;  . FEMORAL-POPLITEAL BYPASS GRAFT Left 09/16/2012   Procedure: LEFT FEMORAL-POPLITEAL BYPASS GRAFT WITH GORTEX Propaten Graft 6x80 Thin Wall and Left lower leg Angiogram;  Surgeon: Nelda Severe  Kellie Simmering, MD;  Location: [facility];  Service: Vascular;  Laterality: Left;  . FEMORAL-TIBIAL BYPASS GRAFT  09/25/2011   Procedure: BYPASS GRAFT FEMORAL-TIBIAL ARTERY;  Surgeon: Mal Misty, MD;  Location[facility] OR;  Service: Vascular;  Laterality: Left;  Left Femoral - Anterior Tibial Bypass;  saphenous vein graft from left leg  . FEMORAL-TIBIAL BYPASS GRAFT  02/07/2012   Procedure: BYPASS GRAFT FEMORAL-TIBIAL ARTERY;  Surgeon: Rosetta Posner, MD;  Location: [place];  Service: Vascular;  Laterality: Left;  Thrombectomy Left Femoral - Tibial Bypass Graft  . FEMORAL-TIBIAL BYPASS GRAFT  04/03/2012    Procedure: BYPASS GRAFT FEMORAL-TIBIAL ARTERY;  Surgeon: Mal Misty, MD;  Location: [place];  Service: Vascular;  Laterality: Left;  Redo  . FEMORAL-TIBIAL BYPASS GRAFT Left 09/30/2012   Procedure: REDO LEFT FEMORAL-ANTERIOR TIBIAL ARTERY BYPASS USING COMPOSITE CEPHALIC AND BASILIC VEIN GRAFT FROM LEFT ARM;  Surgeon: Mal Misty, MD;  Location: [place];  Service: Vascular;  Laterality: Left;  . FOOT SURGERY Right 03/20/2001   "have plates and screws in; didn't break it" (07/10/2012)  . GROIN DEBRIDEMENT Left 11/12/2012   Procedure: CLOSURE INGUINAL WOUND;  Surgeon: Mal Misty, MD;  Location: [place];  Service: Vascular;  Laterality: Left;  . I&D EXTREMITY Left 10/21/2012   Procedure: EXPLORATION AND DEBRIDEMENT OF LEFT GROIN WOUND;  Surgeon: Mal Misty, MD;  Location: [place];  Service: Vascular;  Laterality: Left;  . INSERT / REPLACE / REMOVE PACEMAKER  2007  . INTRAOPERATIVE ARTERIOGRAM  09/25/2011   Procedure: INTRA OPERATIVE ARTERIOGRAM;  Surgeon: Mal Misty, MD;  Location: [place];  Service: Vascular;  Laterality: Left;  . LEG AMPUTATION ABOVE KNEE Left 02/04/2013   DR [place]  . LOWER EXTREMITY ANGIOGRAM Left 12/25/2011   Procedure: LOWER EXTREMITY ANGIOGRAM;  Surgeon: Serafina Mitchell, MD;  Location: [facility] CATH LAB;  Service: Cardiovascular;  Laterality: Left;  lt leg angio co2  . LOWER EXTREMITY ANGIOGRAM N/A 07/07/2012   Procedure: LOWER EXTREMITY ANGIOGRAM;  Surgeon: Conrad Honeoye, MD;  Location: [facility] CATH LAB;  Service: Cardiovascular;  Laterality: N/A;  . LOWER EXTREMITY ANGIOGRAM N/A 09/15/2012   Procedure: LOWER EXTREMITY ANGIOGRAM;  Surgeon: Serafina Mitchell, MD;  Location: [facility] CATH LAB;  Service: Cardiovascular;  Laterality: N/A;  . PATCH ANGIOPLASTY Left 10/21/2012   Procedure: PATCH ANGIOPLASTY;  Surgeon: Mal Misty, MD;  Location: [place];  Service: Vascular;  Laterality: Left;  . PERIPHERAL VASCULAR CATHETERIZATION N/A 11/08/2015   Procedure: Abdominal Aortogram w/Lower Extremity;   Surgeon: Waynetta Sandy, MD;  Location: [place] CV LAB;  Service: Cardiovascular;  Laterality: N/A;  . REMOVAL OF GRAFT Left 04/16/2013   Procedure: I & D LEFT AKA WOUND, POSSIBLE REMOVAL OF INFECTED GORTEX GRAFT;  Surgeon: Mal Misty, MD;  Location: [place];  Service: Vascular;  Laterality: Left;  . RENAL ARTERY STENT  2009  . SHOULDER OPEN ROTATOR CUFF REPAIR Right 2001   repair of lacerated right/notes 10/11/1999  (07/10/2012)  . TONSILLECTOMY    . TURP VAPORIZATION    . UPPER EXTREMITY ANGIOGRAM  09/15/2012   Procedure: UPPER EXTREMITY ANGIOGRAM;  Surgeon: Serafina Mitchell, MD;  Location: [facility] CATH LAB;  Service: Cardiovascular;;  . UVULOPALATOPHARYNGOPLASTY (UPPP)/TONSILLECTOMY/SEPTOPLASTY  06/30/2003   Archie Endo 06/30/2003 (07/10/2012)    Current Medications: Outpatient Medications Prior to Visit  Medication Sig Dispense Refill  . allopurinol (ZYLOPRIM) 100 MG tablet Take 1 tablet by mouth daily as needed for gout. 90 tablet 1  .  amLODipine (NORVASC) 5 MG tablet Take 1 tablet by mouth daily 90 tablet 3  . aspirin EC 81 MG tablet Take 81 mg by mouth daily.    . carvedilol (COREG) 25 MG tablet Take 1 tablet by mouth twice a day with a meal 180 tablet 1  . finasteride (PROSCAR) 5 MG tablet Take 1 tablet by mouth daily 90 tablet 1  . gabapentin (NEURONTIN) 300 MG capsule Take 2 capsules (600 mg total) by mouth 3 (three) times daily. 540 capsule 3  . insulin aspart protamine- aspart (NOVOLOG MIX 70/30) (70-30) 100 UNIT/ML injection Inject 5 Units into the skin 2 (two) times daily with a meal.     . Insulin Pen Needle 31G X 8 MM MISC Use 2-3 times per day and diagnosis code is 250.00 100 each 1  . losartan (COZAAR) 100 MG tablet Take 1 tablet by mouth daily 90 tablet 3  . nitroGLYCERIN (NITROSTAT) 0.4 MG SL tablet Place 1 tablet (0.4 mg total) under the tongue every 5 (five) minutes as needed for chest pain. For chest pain, max 3 doses 90 tablet 3  . ONE TOUCH ULTRA TEST test strip USE TO  TEST BLOOD SUGAR TWO TIMES DAILY AS INSTRUCTED BY PHYSICIAN. 100 each 4  . oxyCODONE-acetaminophen (PERCOCET) 10-325 MG tablet Take 1 tablet by mouth every 6 (six) hours as needed for pain. May take an extra tablet when pain is severe. Only ten days out of the month 120 tablet 0  . tamsulosin (FLOMAX) 0.4 MG CAPS capsule Take 1 capsule (0.4 mg total) by mouth daily. 30 capsule 2  . warfarin (COUMADIN) 5 MG tablet Take 1/2 tablet daily OR TAKE AS DIRECTED BY ANTICOAGULATION CLINIC 60 tablet 1  . furosemide (LASIX) 40 MG tablet Take 1 tablet (40 mg total) by mouth daily. 90 tablet 3   No facility-administered medications prior to visit.      Allergies:   Other and Bactrim ds [sulfamethoxazole-trimethoprim]   Social History   Social History  . Marital status: Widowed    Spouse name: N/A  . Number of children: N/A  . Years of education: N/A   Occupational History  . Retired Retired   Social History Main Topics  . Smoking status: Current Some Day Smoker    Types: Cigars  . Smokeless tobacco: Never Used     Comment: 1 cigar each month  . Alcohol use 1.8 oz/week    3 Glasses of wine per week  . Drug use: No  . Sexual activity: Not Currently   Other Topics Concern  . None   Social History Narrative   Lives in [place] with significant other,  [name].     Family History:  The patient's family history includes Cancer in his father and unknown relative; Heart disease in his unknown relative.   ROS:   Please see the history of present illness.    Having difficulty with vision. Occasional wheezing, otherwise unremarkable.  All other systems reviewed and are negative.   PHYSICAL EXAM:   VS:  BP 104/68 (BP Location: Right Arm)   Pulse 63   Ht 5\' 8"  (1.727 m)   Wt 181 lb 12.8 oz (82.5 kg)   BMI 27.64 kg/m    GEN: Well nourished, well developed, in no acute distress  HEENT: normal  Neck: no JVD, carotid bruits, or masses Cardiac: RRR; no murmurs, rubs, or gallops. There is  no edema. He has a left above-the-knee amputation. Wearing a prosthesis. Respiratory:  clear to  auscultation bilaterally, normal work of breathing GI: soft, nontender, nondistended, + BS MS: no deformity or atrophy  Skin: warm and dry, no rash Neuro:  Alert and Oriented x 3, Strength and sensation are intact Psych: euthymic mood, full affect  Wt Readings from Last 3 Encounters:  10/22/16 181 lb 12.8 oz (82.5 kg)  09/13/16 178 lb 14.4 oz (81.1 kg)  06/05/16 172 lb (78 kg)      Studies/Labs Reviewed:   EKG:  EKG  Ventricular pacing at 64 bpm. Atrial fibrillation is background rhythm.  Recent Labs: 09/13/2016: ALT 11; BUN 34; Creatinine, Ser 1.96; Hemoglobin 13.2; Platelets 143.0; Potassium 5.6; Sodium 141; TSH 3.12   Lipid Panel    Component Value Date/Time   CHOL 213 (H) 09/13/2016 0946   TRIG 117.0 09/13/2016 0946   HDL 43.80 09/13/2016 0946   CHOLHDL 5 09/13/2016 0946   VLDL 23.4 09/13/2016 0946   LDLCALC 146 (H) 09/13/2016 0946   LDLDIRECT 200.5 05/06/2012 1125    Additional studies/ records that were reviewed today include:  2-D Doppler echocardiogram April 2016: Study Conclusions  - Left ventricle: The cavity size was normal. Wall thickness was increased in a pattern of mild LVH. Systolic function was vigorous. The estimated ejection fraction was in the range of 65% to 70%. - Aortic valve: There was trivial regurgitation. - Left atrium: The atrium was mildly dilated. - Right atrium: The atrium was mildly dilated.   ASSESSMENT:    1. Chronic systolic congestive heart failure ([place])   2. Chronic atrial fibrillation (HCC)   3. Essential hypertension   4. CKD (chronic kidney disease) stage 4, GFR 15-29 ml/min (HCC)   5. Uncontrolled type 2 diabetes mellitus with stage 4 chronic kidney disease, with long-term current use of insulin ([place])   6. Coronary artery disease involving autologous artery coronary bypass graft with angina pectoris ([place])   7. PAD  (peripheral artery disease) (HCC)      PLAN:  In order of problems listed above:  1. Stable with most recent echocardiographic imaging demonstrating normal systolic function. Last evaluation greater than 2 is ago. He will have repeat echocardiogram done prior to the next office visit 2. Rondec atrial fibrillation is noted on the EKG performed today with ventricular pacing evident. 3. Blood pressures under excellent control. No change in therapy. 4. Most recent kidney function revealed a creatinine of 1.96 from July 2018.  5. Followed by primary physician Dr. [name].. Most recent hemoglobin A1c in July was 7.0. 6. Stable without anginal complaints. Continue current medical regimen as listed. 7. Status post left AKA without evidence of ulceration or infection.    Medication Adjustments/Labs and Tests Ordered: Current medicines are reviewed at length with the patient today.  Concerns regarding medicines are outlined above.  Medication changes, Labs and Tests ordered today are listed in the Patient Instructions below. There are no Patient Instructions on file for this visit.   Signed, Sinclair Grooms, MD  10/22/2016 2:27 PM    [facility] [address], [place], [place]  [address] Phone: [phone]; Fax: [phone]

## 2016-10-22 NOTE — Patient Instructions (Signed)
Medication Instructions:  None  Labwork: None  Testing/Procedures: Your physician has requested that you have an echocardiogram sometime in the spring prior to seeing Dr. Tamala Julian. Echocardiography is a painless test that uses sound waves to create images of your heart. It provides your doctor with information about the size and shape of your heart and how well your heart's chambers and valves are working. This procedure takes approximately one hour. There are no restrictions for this procedure.    Follow-Up: Your physician wants you to follow-up in: 9-12 months with Dr. Tamala Julian.  You will receive a reminder letter in the mail two months in advance. If you don't receive a letter, please call our office to schedule the follow-up appointment.   Any Other Special Instructions Will Be Listed Below (If Applicable).     If you need a refill on your cardiac medications before your next appointment, please call your pharmacy.

## 2016-10-25 LAB — CUP PACEART REMOTE DEVICE CHECK
Battery Remaining Longevity: 39 mo
Battery Voltage: 2.93 V
Brady Statistic AS VS Percent: 0.27 %
Brady Statistic RA Percent Paced: 0 %
Brady Statistic RV Percent Paced: 99.8 %
Date Time Interrogation Session: 20180727131031
HighPow Impedance: 41 Ohm
HighPow Impedance: 51 Ohm
Implantable Lead Implant Date: 20111024
Implantable Lead Implant Date: 20111024
Implantable Lead Location: 753859
Implantable Lead Model: 6947
Implantable Pulse Generator Implant Date: 20150908
Lead Channel Impedance Value: 342 Ohm
Lead Channel Pacing Threshold Amplitude: 0.625 V
Lead Channel Pacing Threshold Amplitude: 1.75 V
Lead Channel Pacing Threshold Pulse Width: 0.4 ms
Lead Channel Setting Pacing Amplitude: 2.75 V
Lead Channel Setting Pacing Pulse Width: 0.4 ms
MDC IDC LEAD IMPLANT DT: 20111024
MDC IDC LEAD LOCATION: 753858
MDC IDC LEAD LOCATION: 753860
MDC IDC MSMT LEADCHNL LV IMPEDANCE VALUE: 437 Ohm
MDC IDC MSMT LEADCHNL LV IMPEDANCE VALUE: 456 Ohm
MDC IDC MSMT LEADCHNL LV IMPEDANCE VALUE: 760 Ohm
MDC IDC MSMT LEADCHNL LV PACING THRESHOLD PULSEWIDTH: 0.4 ms
MDC IDC MSMT LEADCHNL RA SENSING INTR AMPL: 0.375 mV
MDC IDC MSMT LEADCHNL RA SENSING INTR AMPL: 0.375 mV
MDC IDC MSMT LEADCHNL RV IMPEDANCE VALUE: 399 Ohm
MDC IDC MSMT LEADCHNL RV IMPEDANCE VALUE: 456 Ohm
MDC IDC MSMT LEADCHNL RV SENSING INTR AMPL: 11.5 mV
MDC IDC MSMT LEADCHNL RV SENSING INTR AMPL: 11.5 mV
MDC IDC SET LEADCHNL RV PACING AMPLITUDE: 2.5 V
MDC IDC SET LEADCHNL RV PACING PULSEWIDTH: 0.4 ms
MDC IDC SET LEADCHNL RV SENSING SENSITIVITY: 0.3 mV
MDC IDC STAT BRADY AP VP PERCENT: 0 %
MDC IDC STAT BRADY AP VS PERCENT: 0 %
MDC IDC STAT BRADY AS VP PERCENT: 99.73 %

## 2016-10-26 ENCOUNTER — Emergency Department (HOSPITAL_BASED_OUTPATIENT_CLINIC_OR_DEPARTMENT_OTHER): Payer: PPO

## 2016-10-26 ENCOUNTER — Inpatient Hospital Stay (HOSPITAL_BASED_OUTPATIENT_CLINIC_OR_DEPARTMENT_OTHER)
Admission: EM | Admit: 2016-10-26 | Discharge: 2016-11-03 | DRG: 280 | Disposition: A | Discharge status: Hospice | Payer: PPO | Attending: Internal Medicine | Admitting: Internal Medicine

## 2016-10-26 ENCOUNTER — Encounter (HOSPITAL_BASED_OUTPATIENT_CLINIC_OR_DEPARTMENT_OTHER): Payer: Self-pay

## 2016-10-26 ENCOUNTER — Inpatient Hospital Stay (HOSPITAL_COMMUNITY): Payer: PPO

## 2016-10-26 DIAGNOSIS — M109 Gout, unspecified: Secondary | ICD-10-CM | POA: Diagnosis present

## 2016-10-26 DIAGNOSIS — I5022 Chronic systolic (congestive) heart failure: Secondary | ICD-10-CM | POA: Diagnosis present

## 2016-10-26 DIAGNOSIS — E1165 Type 2 diabetes mellitus with hyperglycemia: Secondary | ICD-10-CM | POA: Diagnosis not present

## 2016-10-26 DIAGNOSIS — I10 Essential (primary) hypertension: Secondary | ICD-10-CM | POA: Diagnosis present

## 2016-10-26 DIAGNOSIS — Z79899 Other long term (current) drug therapy: Secondary | ICD-10-CM

## 2016-10-26 DIAGNOSIS — I482 Chronic atrial fibrillation, unspecified: Secondary | ICD-10-CM | POA: Diagnosis present

## 2016-10-26 DIAGNOSIS — E1151 Type 2 diabetes mellitus with diabetic peripheral angiopathy without gangrene: Secondary | ICD-10-CM | POA: Diagnosis not present

## 2016-10-26 DIAGNOSIS — R0602 Shortness of breath: Secondary | ICD-10-CM | POA: Diagnosis not present

## 2016-10-26 DIAGNOSIS — E875 Hyperkalemia: Secondary | ICD-10-CM | POA: Diagnosis not present

## 2016-10-26 DIAGNOSIS — N059 Unspecified nephritic syndrome with unspecified morphologic changes: Secondary | ICD-10-CM | POA: Diagnosis present

## 2016-10-26 DIAGNOSIS — M1990 Unspecified osteoarthritis, unspecified site: Secondary | ICD-10-CM | POA: Diagnosis not present

## 2016-10-26 DIAGNOSIS — I2510 Atherosclerotic heart disease of native coronary artery without angina pectoris: Secondary | ICD-10-CM | POA: Diagnosis not present

## 2016-10-26 DIAGNOSIS — F17290 Nicotine dependence, other tobacco product, uncomplicated: Secondary | ICD-10-CM | POA: Diagnosis not present

## 2016-10-26 DIAGNOSIS — I129 Hypertensive chronic kidney disease with stage 1 through stage 4 chronic kidney disease, or unspecified chronic kidney disease: Secondary | ICD-10-CM | POA: Diagnosis not present

## 2016-10-26 DIAGNOSIS — Z7982 Long term (current) use of aspirin: Secondary | ICD-10-CM

## 2016-10-26 DIAGNOSIS — I480 Paroxysmal atrial fibrillation: Secondary | ICD-10-CM | POA: Diagnosis not present

## 2016-10-26 DIAGNOSIS — N400 Enlarged prostate without lower urinary tract symptoms: Secondary | ICD-10-CM | POA: Diagnosis present

## 2016-10-26 DIAGNOSIS — F329 Major depressive disorder, single episode, unspecified: Secondary | ICD-10-CM | POA: Diagnosis not present

## 2016-10-26 DIAGNOSIS — Z89612 Acquired absence of left leg above knee: Secondary | ICD-10-CM

## 2016-10-26 DIAGNOSIS — G4733 Obstructive sleep apnea (adult) (pediatric): Secondary | ICD-10-CM | POA: Diagnosis present

## 2016-10-26 DIAGNOSIS — R0603 Acute respiratory distress: Secondary | ICD-10-CM | POA: Diagnosis not present

## 2016-10-26 DIAGNOSIS — Z66 Do not resuscitate: Secondary | ICD-10-CM | POA: Diagnosis present

## 2016-10-26 DIAGNOSIS — I5043 Acute on chronic combined systolic (congestive) and diastolic (congestive) heart failure: Secondary | ICD-10-CM | POA: Diagnosis present

## 2016-10-26 DIAGNOSIS — I351 Nonrheumatic aortic (valve) insufficiency: Secondary | ICD-10-CM | POA: Diagnosis not present

## 2016-10-26 DIAGNOSIS — E1122 Type 2 diabetes mellitus with diabetic chronic kidney disease: Secondary | ICD-10-CM

## 2016-10-26 DIAGNOSIS — I5032 Chronic diastolic (congestive) heart failure: Secondary | ICD-10-CM | POA: Diagnosis not present

## 2016-10-26 DIAGNOSIS — R404 Transient alteration of awareness: Secondary | ICD-10-CM

## 2016-10-26 DIAGNOSIS — Z452 Encounter for adjustment and management of vascular access device: Secondary | ICD-10-CM | POA: Diagnosis not present

## 2016-10-26 DIAGNOSIS — J9601 Acute respiratory failure with hypoxia: Secondary | ICD-10-CM | POA: Diagnosis not present

## 2016-10-26 DIAGNOSIS — J9621 Acute and chronic respiratory failure with hypoxia: Secondary | ICD-10-CM | POA: Diagnosis present

## 2016-10-26 DIAGNOSIS — R4189 Other symptoms and signs involving cognitive functions and awareness: Secondary | ICD-10-CM

## 2016-10-26 DIAGNOSIS — Z7189 Other specified counseling: Secondary | ICD-10-CM

## 2016-10-26 DIAGNOSIS — IMO0002 Reserved for concepts with insufficient information to code with codable children: Secondary | ICD-10-CM | POA: Diagnosis present

## 2016-10-26 DIAGNOSIS — I255 Ischemic cardiomyopathy: Secondary | ICD-10-CM | POA: Diagnosis not present

## 2016-10-26 DIAGNOSIS — I9589 Other hypotension: Secondary | ICD-10-CM | POA: Diagnosis not present

## 2016-10-26 DIAGNOSIS — I953 Hypotension of hemodialysis: Secondary | ICD-10-CM | POA: Diagnosis present

## 2016-10-26 DIAGNOSIS — I214 Non-ST elevation (NSTEMI) myocardial infarction: Principal | ICD-10-CM | POA: Diagnosis present

## 2016-10-26 DIAGNOSIS — N184 Chronic kidney disease, stage 4 (severe): Secondary | ICD-10-CM | POA: Diagnosis present

## 2016-10-26 DIAGNOSIS — J811 Chronic pulmonary edema: Secondary | ICD-10-CM

## 2016-10-26 DIAGNOSIS — Z95810 Presence of automatic (implantable) cardiac defibrillator: Secondary | ICD-10-CM | POA: Diagnosis not present

## 2016-10-26 DIAGNOSIS — Z8673 Personal history of transient ischemic attack (TIA), and cerebral infarction without residual deficits: Secondary | ICD-10-CM

## 2016-10-26 DIAGNOSIS — Z515 Encounter for palliative care: Secondary | ICD-10-CM | POA: Diagnosis not present

## 2016-10-26 DIAGNOSIS — Z794 Long term (current) use of insulin: Secondary | ICD-10-CM

## 2016-10-26 DIAGNOSIS — I272 Pulmonary hypertension, unspecified: Secondary | ICD-10-CM | POA: Diagnosis present

## 2016-10-26 DIAGNOSIS — N189 Chronic kidney disease, unspecified: Secondary | ICD-10-CM | POA: Diagnosis not present

## 2016-10-26 DIAGNOSIS — N183 Chronic kidney disease, stage 3 (moderate): Secondary | ICD-10-CM | POA: Diagnosis not present

## 2016-10-26 DIAGNOSIS — I25729 Atherosclerosis of autologous artery coronary artery bypass graft(s) with unspecified angina pectoris: Secondary | ICD-10-CM | POA: Diagnosis not present

## 2016-10-26 DIAGNOSIS — I739 Peripheral vascular disease, unspecified: Secondary | ICD-10-CM | POA: Diagnosis present

## 2016-10-26 DIAGNOSIS — Z955 Presence of coronary angioplasty implant and graft: Secondary | ICD-10-CM | POA: Diagnosis not present

## 2016-10-26 DIAGNOSIS — N179 Acute kidney failure, unspecified: Secondary | ICD-10-CM | POA: Diagnosis present

## 2016-10-26 DIAGNOSIS — R079 Chest pain, unspecified: Secondary | ICD-10-CM | POA: Diagnosis not present

## 2016-10-26 DIAGNOSIS — I130 Hypertensive heart and chronic kidney disease with heart failure and stage 1 through stage 4 chronic kidney disease, or unspecified chronic kidney disease: Secondary | ICD-10-CM | POA: Diagnosis present

## 2016-10-26 DIAGNOSIS — F17210 Nicotine dependence, cigarettes, uncomplicated: Secondary | ICD-10-CM | POA: Diagnosis not present

## 2016-10-26 DIAGNOSIS — E785 Hyperlipidemia, unspecified: Secondary | ICD-10-CM | POA: Diagnosis present

## 2016-10-26 DIAGNOSIS — E1129 Type 2 diabetes mellitus with other diabetic kidney complication: Secondary | ICD-10-CM | POA: Diagnosis present

## 2016-10-26 DIAGNOSIS — J9811 Atelectasis: Secondary | ICD-10-CM | POA: Diagnosis not present

## 2016-10-26 DIAGNOSIS — Z7901 Long term (current) use of anticoagulants: Secondary | ICD-10-CM

## 2016-10-26 DIAGNOSIS — E1121 Type 2 diabetes mellitus with diabetic nephropathy: Secondary | ICD-10-CM | POA: Diagnosis not present

## 2016-10-26 DIAGNOSIS — I517 Cardiomegaly: Secondary | ICD-10-CM | POA: Diagnosis not present

## 2016-10-26 DIAGNOSIS — I4891 Unspecified atrial fibrillation: Secondary | ICD-10-CM | POA: Diagnosis not present

## 2016-10-26 LAB — CBC
HEMATOCRIT: 39.8 % (ref 39.0–52.0)
HEMOGLOBIN: 13.3 g/dL (ref 13.0–17.0)
MCH: 27.7 pg (ref 26.0–34.0)
MCHC: 33.4 g/dL (ref 30.0–36.0)
MCV: 82.7 fL (ref 78.0–100.0)
Platelets: 166 10*3/uL (ref 150–400)
RBC: 4.81 MIL/uL (ref 4.22–5.81)
RDW: 15.6 % — AB (ref 11.5–15.5)
WBC: 9.4 10*3/uL (ref 4.0–10.5)

## 2016-10-26 LAB — BASIC METABOLIC PANEL
Anion gap: 9 (ref 5–15)
BUN: 55 mg/dL — ABNORMAL HIGH (ref 6–20)
CHLORIDE: 108 mmol/L (ref 101–111)
CO2: 20 mmol/L — AB (ref 22–32)
CREATININE: 2.55 mg/dL — AB (ref 0.61–1.24)
Calcium: 8.8 mg/dL — ABNORMAL LOW (ref 8.9–10.3)
GFR calc non Af Amer: 22 mL/min — ABNORMAL LOW (ref >60)
GFR, EST AFRICAN AMERICAN: 26 mL/min — AB (ref >60)
GLUCOSE: 127 mg/dL — AB (ref 65–99)
Potassium: 5.3 mmol/L — ABNORMAL HIGH (ref 3.5–5.1)
Sodium: 137 mmol/L (ref 135–145)

## 2016-10-26 LAB — GLUCOSE, CAPILLARY
GLUCOSE-CAPILLARY: 210 mg/dL — AB (ref 65–99)
Glucose-Capillary: 190 mg/dL — ABNORMAL HIGH (ref 65–99)

## 2016-10-26 LAB — PROTIME-INR
INR: 1.67
PROTHROMBIN TIME: 19.9 s — AB (ref 11.4–15.2)

## 2016-10-26 LAB — TROPONIN I: Troponin I: 4.79 ng/mL (ref <0.03)

## 2016-10-26 MED ORDER — WARFARIN - PHARMACIST DOSING INPATIENT: Freq: Every day | Status: DC

## 2016-10-26 MED ORDER — OXYCODONE-ACETAMINOPHEN 5-325 MG PO TABS
1.0000 | ORAL_TABLET | Freq: Four times a day (QID) | ORAL | Status: DC | PRN
  Administered 2016-10-29 – 2016-10-31 (×3): 1 via ORAL
  Filled 2016-10-26 (×2): qty 1

## 2016-10-26 MED ORDER — WARFARIN SODIUM 5 MG PO TABS
5.0000 mg | ORAL_TABLET | Freq: Once | ORAL | Status: AC
  Administered 2016-10-26: 5 mg via ORAL
  Filled 2016-10-26: qty 1

## 2016-10-26 MED ORDER — OXYCODONE-ACETAMINOPHEN 10-325 MG PO TABS: 1.0000 | ORAL_TABLET | Freq: Four times a day (QID) | ORAL | Status: DC | PRN

## 2016-10-26 MED ORDER — OXYCODONE HCL 5 MG PO TABS
5.0000 mg | ORAL_TABLET | Freq: Four times a day (QID) | ORAL | Status: DC | PRN
  Administered 2016-10-30 – 2016-10-31 (×2): 5 mg via ORAL
  Filled 2016-10-26: qty 1

## 2016-10-26 MED ORDER — HEPARIN BOLUS VIA INFUSION
2000.0000 [IU] | Freq: Once | INTRAVENOUS | Status: AC
  Administered 2016-10-26: 2000 [IU] via INTRAVENOUS

## 2016-10-26 MED ORDER — HEPARIN (PORCINE) IN NACL 100-0.45 UNIT/ML-% IJ SOLN
900.0000 [IU]/h | INTRAMUSCULAR | Status: DC
  Administered 2016-10-26: 1100 [IU]/h via INTRAVENOUS
  Filled 2016-10-26 (×2): qty 250

## 2016-10-26 MED ORDER — SODIUM CHLORIDE 0.9% FLUSH
3.0000 mL | Freq: Two times a day (BID) | INTRAVENOUS | Status: DC
  Administered 2016-10-26 – 2016-11-03 (×15): 3 mL via INTRAVENOUS

## 2016-10-26 MED ORDER — ASPIRIN EC 81 MG PO TBEC
81.0000 mg | DELAYED_RELEASE_TABLET | Freq: Every day | ORAL | Status: DC
  Administered 2016-10-27 – 2016-11-03 (×8): 81 mg via ORAL
  Filled 2016-10-26 (×8): qty 1

## 2016-10-26 MED ORDER — ONDANSETRON HCL 4 MG/2ML IJ SOLN
4.0000 mg | Freq: Four times a day (QID) | INTRAMUSCULAR | Status: DC | PRN
  Administered 2016-10-28: 4 mg via INTRAVENOUS
  Filled 2016-10-26: qty 2

## 2016-10-26 MED ORDER — ATORVASTATIN CALCIUM 40 MG PO TABS: 40.0000 mg | ORAL_TABLET | Freq: Every day | ORAL | Status: DC

## 2016-10-26 MED ORDER — ASPIRIN 81 MG PO CHEW
324.0000 mg | CHEWABLE_TABLET | Freq: Once | ORAL | Status: AC
  Administered 2016-10-26: 324 mg via ORAL
  Filled 2016-10-26: qty 4

## 2016-10-26 MED ORDER — MORPHINE SULFATE (PF) 2 MG/ML IV SOLN: 1.0000 mg | INTRAVENOUS | Status: DC | PRN

## 2016-10-26 MED ORDER — FINASTERIDE 5 MG PO TABS
5.0000 mg | ORAL_TABLET | Freq: Every day | ORAL | Status: DC
  Administered 2016-10-27 – 2016-11-02 (×7): 5 mg via ORAL
  Filled 2016-10-26 (×7): qty 1

## 2016-10-26 MED ORDER — ACETAMINOPHEN 325 MG PO TABS
650.0000 mg | ORAL_TABLET | ORAL | Status: DC | PRN
  Administered 2016-11-02: 650 mg via ORAL
  Filled 2016-10-26: qty 2

## 2016-10-26 MED ORDER — GABAPENTIN 300 MG PO CAPS
600.0000 mg | ORAL_CAPSULE | Freq: Two times a day (BID) | ORAL | Status: DC
  Administered 2016-10-27 – 2016-10-29 (×6): 600 mg via ORAL
  Filled 2016-10-26 (×6): qty 2

## 2016-10-26 MED ORDER — GABAPENTIN 300 MG PO CAPS: 600.0000 mg | ORAL_CAPSULE | Freq: Three times a day (TID) | ORAL | Status: DC

## 2016-10-26 MED ORDER — FUROSEMIDE 20 MG PO TABS: 20.0000 mg | ORAL_TABLET | Freq: Every day | ORAL | Status: DC

## 2016-10-26 MED ORDER — FUROSEMIDE 10 MG/ML IJ SOLN
60.0000 mg | Freq: Once | INTRAMUSCULAR | Status: AC
  Administered 2016-10-26: 60 mg via INTRAVENOUS
  Filled 2016-10-26: qty 6

## 2016-10-26 MED ORDER — FUROSEMIDE 40 MG PO TABS: 40.0000 mg | ORAL_TABLET | Freq: Every day | ORAL | Status: DC

## 2016-10-26 MED ORDER — TAMSULOSIN HCL 0.4 MG PO CAPS: 0.4000 mg | ORAL_CAPSULE | Freq: Every day | ORAL | Status: DC

## 2016-10-26 MED ORDER — AMLODIPINE BESYLATE 5 MG PO TABS
5.0000 mg | ORAL_TABLET | Freq: Every day | ORAL | Status: DC
  Administered 2016-10-27 – 2016-10-28 (×2): 5 mg via ORAL
  Filled 2016-10-26 (×2): qty 1

## 2016-10-26 MED ORDER — NITROGLYCERIN 0.4 MG SL SUBL
0.4000 mg | SUBLINGUAL_TABLET | SUBLINGUAL | Status: DC | PRN
  Filled 2016-10-26: qty 1

## 2016-10-26 MED ORDER — CARVEDILOL 25 MG PO TABS
25.0000 mg | ORAL_TABLET | Freq: Two times a day (BID) | ORAL | Status: DC
  Administered 2016-10-27 – 2016-10-28 (×3): 25 mg via ORAL
  Filled 2016-10-26 (×3): qty 1

## 2016-10-26 MED ORDER — INSULIN ASPART 100 UNIT/ML ~~LOC~~ SOLN
0.0000 [IU] | SUBCUTANEOUS | Status: DC
  Administered 2016-10-27 (×4): 2 [IU] via SUBCUTANEOUS
  Administered 2016-10-27: 1 [IU] via SUBCUTANEOUS
  Administered 2016-10-28 (×3): 3 [IU] via SUBCUTANEOUS
  Administered 2016-10-28 (×2): 1 [IU] via SUBCUTANEOUS
  Administered 2016-10-28: 2 [IU] via SUBCUTANEOUS
  Administered 2016-10-29: 1 [IU] via SUBCUTANEOUS
  Administered 2016-10-29: 2 [IU] via SUBCUTANEOUS
  Administered 2016-10-29: 3 [IU] via SUBCUTANEOUS

## 2016-10-26 NOTE — Consult Note (Signed)
Cardiology Consultation   Patient ID: [name]; [id number]; 1935/02/21   Admit date: 10/26/2016 Date of Consult: 10/26/2016  Referring Provider:  Myna Hidalgo  Primary Care Provider: Laurey Morale, MD Cardiologist: Daneen Schick Electrophysiologist:  Allred  Reason for Consultation: NSTEMI  History of Present Illness: [name] is a 82 y.o. male who is being seen today for the evaluation of NSTEMI at the request of [facility]. 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 Pt transferred to [facility] after presenting to OSH emergency department for evaluation of chest pain. Patient reports that he had experienced 1-2 weeks of intermittent left arm discomfort, described as an ache that was worse with exertion and better with Advil. Then today, he experienced pain in the central and left chest, described as pressure-like, nonradiating, associated with dyspnea, not associated with nausea or diaphoresis, and lasting one hour until he took one nitroglycerin.  Pt found to have Cr 2.55, troponin 4.79 @ OSH. INR was subtherapeutic at 1.67. He has been treated w/ heparin IV gtt and is currently pain-free.  Past Medical History:  Diagnosis Date  . Arthritis   . Atrial fibrillation ([place])   . Benign prostatic hypertrophy   . CAD (coronary artery disease)    a. s/p mid LAD stenting with DES 2008 Dr. Daneen Schick  . Chronic systolic dysfunction of left ventricle    a. mixed ischemic and nonischemic CM,  EF 35%. b. s/p AICD implantation.  . Constipation   . Depression   . ED (erectile dysfunction)   . Gout   . History of MRSA infection   . Hypertension    takes Carvedilol and Losartan daily  . Insomnia   . PAD (peripheral artery disease) ([facility])    s/p multiple LLE bypass grafts; left mid-distal  SCA occlusion by 08/2012 duplex  . Renal artery stenosis ([place])    s/p stenting 2009  . Sleep apnea    hx of "had surgery for"  . Type II diabetes mellitus ([place])     Past Surgical History:  Procedure Laterality Date  . ABDOMINAL ANGIOGRAM  12/25/2011   Procedure: ABDOMINAL ANGIOGRAM;  Surgeon: Serafina Mitchell, MD;  Location: [facility] CATH LAB;  Service: Cardiovascular;;  . ABDOMINAL AORTAGRAM N/A 09/11/2011   Procedure: ABDOMINAL Maxcine Ham;  Surgeon: Wellington Hampshire, MD;  Location: [place] CATH LAB;  Service: Cardiovascular;  Laterality: N/A;  . ADENOIDECTOMY     Hx; of   . AMPUTATION Left 02/04/2013   Procedure: AMPUTATION ABOVE KNEE-LEFT;  Surgeon: Mal Misty, MD;  Location: [place];  Service: Vascular;  Laterality: Left;  . BICEPS TENDON REPAIR Right 2001   Archie Endo 03/20/2001 (07/10/2012)  . BIV ICD GENERTAOR CHANGE OUT N/A 11/09/2013   Procedure: BIV ICD GENERTAOR CHANGE OUT;  Surgeon: Coralyn Mark, MD;  Location: [facility] CATH LAB;  Service: Cardiovascular;  Laterality: N/A;  . CARDIAC DEFIBRILLATOR PLACEMENT  12/26/09   pacemaker combo  . CARPAL TUNNEL RELEASE Right 2002   Archie Endo 03/20/2001 (07/10/2012)  . cervical epidural injection  2013  . COLONOSCOPY     Hx; of  . CORONARY ANGIOPLASTY    . CORONARY ANGIOPLASTY WITH STENT PLACEMENT  ~ 2000  . EMBOLECTOMY  02/07/2012   Procedure: EMBOLECTOMY;  Surgeon: Rosetta Posner, MD;  Location: [place];  Service: Vascular;  Laterality: Left;  . EMBOLECTOMY  Left 12/16/2012   Procedure: THROMBECTOMY  LEFT LEG BYPASS;  Surgeon: Elam Dutch, MD;  Location: [place];  Service: Vascular;  Laterality: Left;  . FEMORAL-POPLITEAL BYPASS GRAFT Left 09/16/2012   Procedure: LEFT FEMORAL-POPLITEAL BYPASS GRAFT WITH GORTEX Propaten Graft 6x80 Thin Wall and Left lower leg Angiogram;  Surgeon: Mal Misty, MD;  Location: [place];  Service: Vascular;  Laterality: Left;  . FEMORAL-TIBIAL BYPASS GRAFT  09/25/2011   Procedure: BYPASS GRAFT FEMORAL-TIBIAL ARTERY;  Surgeon: Mal Misty, MD;  Location: [facility] OR;  Service: Vascular;  Laterality: Left;  Left Femoral - Anterior Tibial Bypass;  saphenous vein graft from left leg  . FEMORAL-TIBIAL BYPASS GRAFT  02/07/2012   Procedure: BYPASS GRAFT FEMORAL-TIBIAL ARTERY;  Surgeon: Rosetta Posner, MD;  Location: [place];  Service: Vascular;  Laterality: Left;  Thrombectomy Left Femoral - Tibial Bypass Graft  . FEMORAL-TIBIAL BYPASS GRAFT  04/03/2012   Procedure: BYPASS GRAFT FEMORAL-TIBIAL ARTERY;  Surgeon: Mal Misty, MD;  Location: [place];  Service: Vascular;  Laterality: Left;  Redo  . FEMORAL-TIBIAL BYPASS GRAFT Left 09/30/2012   Procedure: REDO LEFT FEMORAL-ANTERIOR TIBIAL ARTERY BYPASS USING COMPOSITE CEPHALIC AND BASILIC VEIN GRAFT FROM LEFT ARM;  Surgeon: Mal Misty, MD;  Location: [place];  Service: Vascular;  Laterality: Left;  . FOOT SURGERY Right 03/20/2001   "have plates and screws in; didn't break it" (07/10/2012)  . GROIN DEBRIDEMENT Left 11/12/2012   Procedure: CLOSURE INGUINAL WOUND;  Surgeon: Mal Misty, MD;  Location: [place];  Service: Vascular;  Laterality: Left;  . I&D EXTREMITY Left 10/21/2012   Procedure: EXPLORATION AND DEBRIDEMENT OF LEFT GROIN WOUND;  Surgeon: Mal Misty, MD;  Location: [place];  Service: Vascular;  Laterality: Left;  . INSERT / REPLACE / REMOVE PACEMAKER  2007  . INTRAOPERATIVE ARTERIOGRAM  09/25/2011   Procedure: INTRA OPERATIVE ARTERIOGRAM;  Surgeon: Mal Misty, MD;  Location: [place];  Service: Vascular;  Laterality: Left;  . LEG AMPUTATION ABOVE KNEE Left 02/04/2013   DR LAWSON  . LOWER EXTREMITY ANGIOGRAM Left 12/25/2011   Procedure: LOWER EXTREMITY ANGIOGRAM;  Surgeon: Serafina Mitchell, MD;  Location: [facility] CATH LAB;  Service: Cardiovascular;  Laterality: Left;  lt leg angio co2  . LOWER EXTREMITY ANGIOGRAM N/A 07/07/2012   Procedure: LOWER EXTREMITY ANGIOGRAM;  Surgeon: Conrad St. John, MD;  Location: [facility] CATH LAB;  Service: Cardiovascular;  Laterality: N/A;  . LOWER EXTREMITY ANGIOGRAM N/A  09/15/2012   Procedure: LOWER EXTREMITY ANGIOGRAM;  Surgeon: Serafina Mitchell, MD;  Location: [facility] CATH LAB;  Service: Cardiovascular;  Laterality: N/A;  . PATCH ANGIOPLASTY Left 10/21/2012   Procedure: PATCH ANGIOPLASTY;  Surgeon: Mal Misty, MD;  Location: [place];  Service: Vascular;  Laterality: Left;  . PERIPHERAL VASCULAR CATHETERIZATION N/A 11/08/2015   Procedure: Abdominal Aortogram w/Lower Extremity;  Surgeon: Waynetta Sandy, MD;  Location: [place] CV LAB;  Service: Cardiovascular;  Laterality: N/A;  . REMOVAL OF GRAFT Left 04/16/2013   Procedure: I & D LEFT AKA WOUND, POSSIBLE REMOVAL OF INFECTED GORTEX GRAFT;  Surgeon: Mal Misty, MD;  Location: [place];  Service: Vascular;  Laterality: Left;  . RENAL ARTERY STENT  2009  . SHOULDER OPEN ROTATOR CUFF REPAIR Right 2001   repair of lacerated right/notes 10/11/1999  (07/10/2012)  . TONSILLECTOMY    . TURP VAPORIZATION    . UPPER EXTREMITY ANGIOGRAM  09/15/2012   Procedure: UPPER EXTREMITY ANGIOGRAM;  Surgeon: Serafina Mitchell, MD;  Location: [place] CATH LAB;  Service: Cardiovascular;;  . UVULOPALATOPHARYNGOPLASTY (UPPP)/TONSILLECTOMY/SEPTOPLASTY  06/30/2003   Archie Endo 06/30/2003 (07/10/2012)      Current Medications: . [START ON 10/27/2016] amLODipine  5 mg Oral Daily  . [START ON 10/27/2016] aspirin EC  81 mg Oral Daily  . [START ON 10/27/2016] atorvastatin  40 mg Oral q1800  . [START ON 10/27/2016] carvedilol  25 mg Oral BID WC  . [START ON 10/27/2016] finasteride  5 mg Oral Daily  . [START ON 10/27/2016] furosemide  40 mg Oral Daily  . [START ON 10/27/2016] gabapentin  600 mg Oral TID  . insulin aspart  0-9 Units Subcutaneous Q4H  . sodium chloride flush  3 mL Intravenous Q12H  . [START ON 10/27/2016] tamsulosin  0.4 mg Oral Daily  . warfarin  5 mg Oral Once  . [START ON 10/27/2016] Warfarin - Pharmacist Dosing Inpatient   Does not apply q1800    Infused Medications: . heparin 1,100 Units/hr (10/26/16 1615)    PRN  Medications: acetaminophen, morphine injection, nitroGLYCERIN, ondansetron (ZOFRAN) IV, oxyCODONE-acetaminophen   Allergies:    Allergies  Allergen Reactions  . Other Other (See Comments)    Plastic tape tears skin  . Bactrim Ds [Sulfamethoxazole-Trimethoprim] Other (See Comments)    Hand tremors    Social History:   The patient  reports that he has been smoking Cigars.  He has never used smokeless tobacco. He reports that he drinks about 1.8 oz of alcohol per week . He reports that he does not use drugs.    Family History:   The patient's family history includes Cancer in his father and unknown relative; Heart disease in his unknown relative.   ROS:  Please see the history of present illness.  All other ROS reviewed and negative.     Vital Signs: Blood pressure 107/65, pulse 76, temperature (!) 97.5 F (36.4 C), temperature source Axillary, resp. rate (!) 34, SpO2 96 %.   PHYSICAL EXAM: General:  Well nourished, well developed, in mild resp distress HEENT: normal Lymph: no adenopathy Neck: no JVD Endocrine:  No thryomegaly Vascular: No carotid bruits; s/p L AKA. R DP not palpable Cardiac:  normal S1, S2; RRR; no murmur  Lungs:  Decreased BS bilaterally. Course upper airway noise bilaterally  Abd: soft, nontender, no hepatomegaly  Ext: no edema Musculoskeletal:  S/p L AKA.  Skin: warm and dry  Neuro:  CNs 2-12 intact, no focal abnormalities noted Psych:  Normal affect   EKG:  Ventricular paced  Labs:  Recent Labs  10/26/16 1400  TROPONINI 4.79*   No results for input(s): TROPIPOC in the last 72 hours.  Lab Results  Component Value Date   WBC 9.4 10/26/2016   HGB 13.3 10/26/2016   HCT 39.8 10/26/2016   MCV 82.7 10/26/2016   PLT 166 10/26/2016    Recent Labs Lab 10/26/16 1400  NA 137  K 5.3*  CL 108  CO2 20*  BUN 55*  CREATININE 2.55*  CALCIUM 8.8*  GLUCOSE 127*   Lab Results  Component Value Date   CHOL 213 (H) 09/13/2016   HDL 43.80 09/13/2016    LDLCALC 146 (H) 09/13/2016   TRIG 117.0 09/13/2016   No results found for: DDIMER  Radiology/Studies:  Dg Chest 2 View  Result Date: 10/26/2016 CLINICAL DATA:  Chest pain EXAM: CHEST  2 VIEW COMPARISON:  03/22/2016 FINDINGS: Biventricular pacer from the left with leads in stable position. Low volumes with chronic mild elevation of the left diaphragm. Mild  streaky left infrahilar opacity and lateral left costophrenic sulcus blunting, chronic. There is no edema, consolidation, effusion, or pneumothorax. Chronic cardiomegaly. IMPRESSION: 1. No acute finding when compared to prior. 2. Chronic mild left base scarring and diaphragm elevation. Electronically Signed   By: Monte Fantasia M.D.   On: 10/26/2016 14:27   Dg Chest Port 1 View  Result Date: 10/26/2016 CLINICAL DATA:  Respiratory distress EXAM: PORTABLE CHEST 1 VIEW COMPARISON:  Chest x-ray from earlier in the same day FINDINGS: Cardiac shadow is enlarged but stable. Defibrillator is again noted and stable. Increasing right basilar infiltrate is seen. Chronic changes are again noted in the left base. IMPRESSION: Increasing right basilar infiltrate when compared with the prior exam. Electronically Signed   By: Inez Catalina M.D.   On: 10/26/2016 21:45    CARDIAC STUDIES: 2-D Doppler echocardiogram April 2016: Study Conclusions  - Left ventricle: The cavity size was normal. Wall thickness was increased in a pattern of mild LVH. Systolic function was vigorous. The estimated ejection fraction was in the range of 65% to 70%. - Aortic valve: There was trivial regurgitation. - Left atrium: The atrium was mildly dilated. - Right atrium: The atrium was mildly dilated.  Pt has h/o CAD s/p PCI to mLAD in 2008 by Dr. Tamala Julian  ASSESSMENT AND PLAN:  1. CAD/NSTEMI: pt currently symptom-free. Will cont to cycle troponins. Needs cath, but has AOCRF which may complicate the picture w/ Cr >2 currently. As long as he remains asymptomatic, would  cont with medical management. Further discussion re: timing of LHC can take place tomorrow b/w primary cardiology team and pt/family. Pt currently DNR; will have to rescind temporarily for LHC Cont IV heparin. Would not continue coumadin for now--INR 1.67 probably OK for cath but would hold coumadin at this point.  2. PAD: stable for now  3. HTN: cont home regimen and titrate PRN  4. H/o ICM but last EF documented to be normal. Repeat TTE has already been ordered by hospitalist team  5. Chronic afib: hold coumadin as above. afib underlying with V-paced rhythm  Signed, Rudean Curt, MD  10/26/2016 10:16 PM

## 2016-10-26 NOTE — ED Notes (Signed)
Patient transported to X-ray 

## 2016-10-26 NOTE — Progress Notes (Signed)
Patient arrived from [place] in respiratory distress.  Patient using accessory muscles to breath, respirations at 34 per RN observation.  02 mid to high 80% on 4L nasal cannula.  Patient only able to answer questions with one to two word answers.  Per patient has felt short of breath for the last two hours.  Rapid response RN called. [facility] admissions pager paged.

## 2016-10-26 NOTE — ED Notes (Signed)
Troponin 4.79 , result given to ED MD

## 2016-10-26 NOTE — ED Notes (Signed)
Pt upset about wait time for transport. [facility] called by NS. Pt updated that [facility] is en route.

## 2016-10-26 NOTE — Progress Notes (Addendum)
Discussed case with Dr. Ralene Bathe. 82 year old male with complicated medical hx including of atrial fib, CAD with hx of LAD stent, PVD with hx multiple LLE bypass graft, L AKA, HTN, hx HFrEF s/p AICD placement (most recent EF 65-70% per 06/2014 echo), T2DM who presented to the ED after having an episode of L sided CP 2 hours prior to arrival at the ED.  CP resolved after nitro and he continues to be asx.  He had 2 weeks of waxing/waning L arm tingling prior to this.  Troponin elevated to 4.79 and patient with elevated Cr to 2.55 (bl fluctuates, but around 2).  K elevated to 5.3.  INR subtherapeutic.  EKG ventricularly paced, appears similar to priors.  He was started on [place] heparin gtt and is s/p ASA 324.  Cards was called and recommended hospitalist admission due to comorbidities.  Plan to admit to telemetry for NSTEMI.  Patient is DNR/DNI, but would be open to procedure if desired.       Vitals:   10/26/16 1531 10/26/16 1600  BP: 136/67 136/75  Pulse: 61 62  Resp: 16 (!) 29  Temp:    SpO2: 94% 96%

## 2016-10-26 NOTE — H&P (Signed)
History and Physical    [name]:[id number] DOB: [birth year] DOA: 10/26/2016  PCP: Laurey Morale, MD   Patient coming from: Home, by way of [facility]  Chief Complaint: Chest pain   HPI: [name] is a 82 y.o. male with medical history significant for coronary artery disease with stent to LAD, chronic systolic CHF, atrial fibrillation on warfarin, peripheral arterial disease status post left AKA, and insulin-dependent diabetes mellitus, now presenting to the emergency department for evaluation of chest pain. Patient reports that he had experienced 1-2 weeks of intermittent left arm discomfort, described as an ache that was worse with exertion and better with Advil. Then today, he experienced pain in the central and left chest, described as pressure-like, nonradiating, associated with dyspnea, not associated with nausea or diaphoresis, and lasting one hour until he took one nitroglycerin. Patient denies fevers or chills, denies abdominal pain, and denies melena or hematochezia.  [facility] ED Course: Upon arrival to the [facility] ED, patient is found to be afebrile, saturating well on room air, and with vital signs stable. EKG featured a ventricular paced rhythm and chest x-ray was negative for acute cardiopulmonary disease. Chemistry panel revealed a potassium of 5.3, BUN 55, and creatinine of 2.55, up from his apparent baseline of roughly 2.0. CBC is unremarkable. INR is subtherapeutic at 1.67. Troponin is elevated to 4.79. Patient was given 324 mg aspirin and started on heparin infusion. Cardiology was consulted by the ED physician and recommended a medical admission with cardiology to consult. Patient remained hemodynamically stable and in no apparent respiratory distress and was to be admitted to the telemetry unit [facility] for ongoing evaluation and management of N-STEMI, but developed acute hypoxic respiratory failure while en route and will be admitted to the  stepdown unit.  Review of Systems:  All other systems reviewed and apart from HPI, are negative.  Past Medical History:  Diagnosis Date  . Arthritis   . Atrial fibrillation ([place])   . Benign prostatic hypertrophy   . CAD (coronary artery disease)    a. s/p mid LAD stenting with DES 2008 Dr. Daneen Schick  . Chronic systolic dysfunction of left ventricle    a. mixed ischemic and nonischemic CM,  EF 35%. b. s/p AICD implantation.  . Constipation   . Depression   . ED (erectile dysfunction)   . Gout   . History of MRSA infection   . Hypertension    takes Carvedilol and Losartan daily  . Insomnia   . PAD (peripheral artery disease) (HCC)    s/p multiple LLE bypass grafts; left mid-distal SCA occlusion by 08/2012 duplex  . Renal artery stenosis ([place])    s/p stenting 2009  . Sleep apnea    hx of "had surgery for"  . Type II diabetes mellitus ([place])     Past Surgical History:  Procedure Laterality Date  . ABDOMINAL ANGIOGRAM  12/25/2011   Procedure: ABDOMINAL ANGIOGRAM;  Surgeon: Serafina Mitchell, MD;  Location: [facility] CATH LAB;  Service: Cardiovascular;;  . ABDOMINAL AORTAGRAM N/A 09/11/2011   Procedure: ABDOMINAL Maxcine Ham;  Surgeon: Wellington Hampshire, MD;  Location: [place] CATH LAB;  Service: Cardiovascular;  Laterality: N/A;  . ADENOIDECTOMY     Hx; of   . AMPUTATION Left 02/04/2013   Procedure: AMPUTATION ABOVE KNEE-LEFT;  Surgeon: Mal Misty, MD;  Location: [place];  Service: Vascular;  Laterality: Left;  . BICEPS TENDON REPAIR Right 2001   Archie Endo 03/20/2001 (07/10/2012)  .  BIV ICD GENERTAOR CHANGE OUT N/A 11/09/2013   Procedure: BIV ICD GENERTAOR CHANGE OUT;  Surgeon: Coralyn Mark, MD;  Location: [facility] CATH LAB;  Service: Cardiovascular;  Laterality: N/A;  . CARDIAC DEFIBRILLATOR PLACEMENT  12/26/09   pacemaker combo  . CARPAL TUNNEL RELEASE Right 2002   Archie Endo 03/20/2001 (07/10/2012)  . cervical epidural injection  2013  . COLONOSCOPY     Hx; of  . CORONARY ANGIOPLASTY    . CORONARY  ANGIOPLASTY WITH STENT PLACEMENT  ~ 2000  . EMBOLECTOMY  02/07/2012   Procedure: EMBOLECTOMY;  Surgeon: Rosetta Posner, MD;  Location: [place];  Service: Vascular;  Laterality: Left;  . EMBOLECTOMY Left 12/16/2012   Procedure: THROMBECTOMY  LEFT LEG BYPASS;  Surgeon: Elam Dutch, MD;  Location: [place];  Service: Vascular;  Laterality: Left;  . FEMORAL-POPLITEAL BYPASS GRAFT Left 09/16/2012   Procedure: LEFT FEMORAL-POPLITEAL BYPASS GRAFT WITH GORTEX Propaten Graft 6x80 Thin Wall and Left lower leg Angiogram;  Surgeon: Mal Misty, MD;  Location: [place];  Service: Vascular;  Laterality: Left;  . FEMORAL-TIBIAL BYPASS GRAFT  09/25/2011   Procedure: BYPASS GRAFT FEMORAL-TIBIAL ARTERY;  Surgeon: Mal Misty, MD;  Location[facility] OR;  Service: Vascular;  Laterality: Left;  Left Femoral - Anterior Tibial Bypass;  saphenous vein graft from left leg  . FEMORAL-TIBIAL BYPASS GRAFT  02/07/2012   Procedure: BYPASS GRAFT FEMORAL-TIBIAL ARTERY;  Surgeon: Rosetta Posner, MD;  Location: [place];  Service: Vascular;  Laterality: Left;  Thrombectomy Left Femoral - Tibial Bypass Graft  . FEMORAL-TIBIAL BYPASS GRAFT  04/03/2012   Procedure: BYPASS GRAFT FEMORAL-TIBIAL ARTERY;  Surgeon: Mal Misty, MD;  Location: [place];  Service: Vascular;  Laterality: Left;  Redo  . FEMORAL-TIBIAL BYPASS GRAFT Left 09/30/2012   Procedure: REDO LEFT FEMORAL-ANTERIOR TIBIAL ARTERY BYPASS USING COMPOSITE CEPHALIC AND BASILIC VEIN GRAFT FROM LEFT ARM;  Surgeon: Mal Misty, MD;  Location: [place];  Service: Vascular;  Laterality: Left;  . FOOT SURGERY Right 03/20/2001   "have plates and screws in; didn't break it" (07/10/2012)  . GROIN DEBRIDEMENT Left 11/12/2012   Procedure: CLOSURE INGUINAL WOUND;  Surgeon: Mal Misty, MD;  Location: [place];  Service: Vascular;  Laterality: Left;  . I&D EXTREMITY Left 10/21/2012   Procedure: EXPLORATION AND DEBRIDEMENT OF LEFT GROIN WOUND;  Surgeon: Mal Misty, MD;  Location: [place];  Service:  Vascular;  Laterality: Left;  . INSERT / REPLACE / REMOVE PACEMAKER  2007  . INTRAOPERATIVE ARTERIOGRAM  09/25/2011   Procedure: INTRA OPERATIVE ARTERIOGRAM;  Surgeon: Mal Misty, MD;  Location: [place];  Service: Vascular;  Laterality: Left;  . LEG AMPUTATION ABOVE KNEE Left 02/04/2013   DR LAWSON  . LOWER EXTREMITY ANGIOGRAM Left 12/25/2011   Procedure: LOWER EXTREMITY ANGIOGRAM;  Surgeon: Serafina Mitchell, MD;  Location: [facility] CATH LAB;  Service: Cardiovascular;  Laterality: Left;  lt leg angio co2  . LOWER EXTREMITY ANGIOGRAM N/A 07/07/2012   Procedure: LOWER EXTREMITY ANGIOGRAM;  Surgeon: Conrad West Jefferson, MD;  Location: [facility] CATH LAB;  Service: Cardiovascular;  Laterality: N/A;  . LOWER EXTREMITY ANGIOGRAM N/A 09/15/2012   Procedure: LOWER EXTREMITY ANGIOGRAM;  Surgeon: Serafina Mitchell, MD;  Location: [facility] CATH LAB;  Service: Cardiovascular;  Laterality: N/A;  . PATCH ANGIOPLASTY Left 10/21/2012   Procedure: PATCH ANGIOPLASTY;  Surgeon: Mal Misty, MD;  Location: [place];  Service: Vascular;  Laterality: Left;  . PERIPHERAL VASCULAR CATHETERIZATION N/A 11/08/2015   Procedure:  Abdominal Aortogram w/Lower Extremity;  Surgeon: Waynetta Sandy, MD;  Location: [place] CV LAB;  Service: Cardiovascular;  Laterality: N/A;  . REMOVAL OF GRAFT Left 04/16/2013   Procedure: I & D LEFT AKA WOUND, POSSIBLE REMOVAL OF INFECTED GORTEX GRAFT;  Surgeon: Mal Misty, MD;  Location: [place];  Service: Vascular;  Laterality: Left;  . RENAL ARTERY STENT  2009  . SHOULDER OPEN ROTATOR CUFF REPAIR Right 2001   repair of lacerated right/notes 10/11/1999  (07/10/2012)  . TONSILLECTOMY    . TURP VAPORIZATION    . UPPER EXTREMITY ANGIOGRAM  09/15/2012   Procedure: UPPER EXTREMITY ANGIOGRAM;  Surgeon: Serafina Mitchell, MD;  Location: [facility] CATH LAB;  Service: Cardiovascular;;  . UVULOPALATOPHARYNGOPLASTY (UPPP)/TONSILLECTOMY/SEPTOPLASTY  06/30/2003   Archie Endo 06/30/2003 (07/10/2012)     reports that he has been smoking Cigars.   He has never used smokeless tobacco. He reports that he drinks about 1.8 oz of alcohol per week . He reports that he does not use drugs.  Allergies  Allergen Reactions  . Other Other (See Comments)    Plastic tape tears skin  . Bactrim Ds [Sulfamethoxazole-Trimethoprim] Other (See Comments)    Hand tremors    Family History  Problem Relation Age of Onset  . Cancer Father   . Cancer Unknown        breast/fhx  . Heart disease Unknown        fhx  . Diabetes Neg Hx      Prior to Admission medications   Medication Sig Start Date End Date Taking? Authorizing Provider  allopurinol (ZYLOPRIM) 100 MG tablet Take 1 tablet by mouth daily as needed for gout. 08/07/16   Laurey Morale, MD  amLODipine (NORVASC) 5 MG tablet Take 1 tablet by mouth daily 07/17/16   Laurey Morale, MD  aspirin EC 81 MG tablet Take 81 mg by mouth daily.    [provider]  carvedilol (COREG) 25 MG tablet Take 1 tablet by mouth twice a day with a meal 08/07/16   Laurey Morale, MD  finasteride (PROSCAR) 5 MG tablet Take 1 tablet by mouth daily 08/07/16   Laurey Morale, MD  furosemide (LASIX) 40 MG tablet Take 40 mg by mouth daily.    [provider]  gabapentin (NEURONTIN) 300 MG capsule Take 2 capsules (600 mg total) by mouth 3 (three) times daily. 08/17/15   Laurey Morale, MD  insulin aspart protamine- aspart (NOVOLOG MIX 70/30) (70-30) 100 UNIT/ML injection Inject 5 Units into the skin 2 (two) times daily with a meal.  03/03/13   Dutch Quint B, FNP  Insulin Pen Needle 31G X 8 MM MISC Use 2-3 times per day and diagnosis code is 250.00 11/25/13   Laurey Morale, MD  losartan (COZAAR) 100 MG tablet Take 1 tablet by mouth daily 07/17/16   Laurey Morale, MD  nitroGLYCERIN (NITROSTAT) 0.4 MG SL tablet Place 1 tablet (0.4 mg total) under the tongue every 5 (five) minutes as needed for chest pain. For chest pain, max 3 doses 05/28/13   Laurey Morale, MD  ONE TOUCH ULTRA TEST test strip USE TO TEST BLOOD SUGAR  TWO TIMES DAILY AS INSTRUCTED BY PHYSICIAN. 03/07/16   Laurey Morale, MD  oxyCODONE-acetaminophen (PERCOCET) 10-325 MG tablet Take 1 tablet by mouth every 6 (six) hours as needed for pain. May take an extra tablet when pain is severe. Only ten days out of the month 10/11/16   [name]  A, MD  tamsulosin (FLOMAX) 0.4 MG CAPS capsule Take 1 capsule (0.4 mg total) by mouth daily. 08/02/16   Laurey Morale, MD  warfarin (COUMADIN) 5 MG tablet Take 1/2 tablet daily OR TAKE AS DIRECTED BY ANTICOAGULATION CLINIC 09/20/16   Laurey Morale, MD    Physical Exam: Vitals:   10/26/16 1802 10/26/16 1830 10/26/16 1917 10/26/16 2056  BP: 131/62 108/85  107/65  Pulse: 66 64  76  Resp: (!) 24 (!) 21  (!) 34  Temp:    (!) 97.5 F (36.4 C)  TempSrc:    Axillary  SpO2: 95% 90% 94% 96%      Constitutional: Tachypneic, dyspneic with speech, no pallor, no diaphoresis Eyes: PERTLA, lids and conjunctivae normal ENMT: Mucous membranes are moist. Posterior pharynx clear of any exudate or lesions.   Neck: normal, supple, no masses, no thyromegaly Respiratory: Rales bilaterally. Tachypnea, increased WOB. Symmetric chest-wall expansion. No pallor or cyanosis.   Cardiovascular: S1 & S2 heard, regular rate and rhythm. No extremity edema. No diaphoresis. Abdomen: No distension, no tenderness, no masses palpated. Bowel sounds normal.  Musculoskeletal: no clubbing / cyanosis. Status-post left AKA.     Skin: no significant rashes, lesions, ulcers. Warm, dry, well-perfused. Neurologic: CN 2-12 grossly intact. Sensation intact, DTR normal. Strength 5/5 in all 4 limbs.  Psychiatric: Alert and oriented x 3. Calm, cooperative.     Labs on Admission: I have personally reviewed following labs and imaging studies  CBC:  Recent Labs Lab 10/26/16 1322  WBC 9.4  HGB 13.3  HCT 39.8  MCV 82.7  PLT 948   Basic Metabolic Panel:  Recent Labs Lab 10/26/16 1400  NA 137  K 5.3*  CL 108  CO2 20*  GLUCOSE 127*  BUN 55*   CREATININE 2.55*  CALCIUM 8.8*   GFR: Estimated Creatinine Clearance: 24.2 mL/min (A) (by C-G formula based on SCr of 2.55 mg/dL (H)). Liver Function Tests: No results for input(s): AST, ALT, ALKPHOS, BILITOT, PROT, ALBUMIN in the last 168 hours. No results for input(s): LIPASE, AMYLASE in the last 168 hours. No results for input(s): AMMONIA in the last 168 hours. Coagulation Profile:  Recent Labs Lab 10/26/16 1400  INR 1.67   Cardiac Enzymes:  Recent Labs Lab 10/26/16 1400  TROPONINI 4.79*   BNP (last 3 results) No results for input(s): PROBNP in the last 8760 hours. HbA1C: No results for input(s): HGBA1C in the last 72 hours. CBG:  Recent Labs Lab 10/26/16 2113  GLUCAP 190*   Lipid Profile: No results for input(s): CHOL, HDL, LDLCALC, TRIG, CHOLHDL, LDLDIRECT in the last 72 hours. Thyroid Function Tests: No results for input(s): TSH, T4TOTAL, FREET4, T3FREE, THYROIDAB in the last 72 hours. Anemia Panel: No results for input(s): VITAMINB12, FOLATE, FERRITIN, TIBC, IRON, RETICCTPCT in the last 72 hours. Urine analysis:    Component Value Date/Time   COLORURINE AMBER (A) 06/05/2016 1708   APPEARANCEUR CLEAR 06/05/2016 1708   LABSPEC 1.019 06/05/2016 1708   PHURINE 5.0 06/05/2016 1708   GLUCOSEU NEGATIVE 06/05/2016 1708   HGBUR NEGATIVE 06/05/2016 1708   HGBUR negative 12/29/2008 0818   BILIRUBINUR N 09/13/2016 1023   KETONESUR NEGATIVE 06/05/2016 1708   PROTEINUR N 09/13/2016 1023   PROTEINUR NEGATIVE 06/05/2016 1708   UROBILINOGEN 0.2 09/13/2016 1023   UROBILINOGEN 0.2 06/15/2014 1608   NITRITE N 09/13/2016 1023   NITRITE NEGATIVE 06/05/2016 1708   LEUKOCYTESUR Negative 09/13/2016 1023   Sepsis Labs: @LABRCNTIP (procalcitonin:4,lacticidven:4) )No results found for this or any  previous visit (from the past 240 hour(s)).   Radiological Exams on Admission: Dg Chest 2 View  Result Date: 10/26/2016 CLINICAL DATA:  Chest pain EXAM: CHEST  2 VIEW  COMPARISON:  03/22/2016 FINDINGS: Biventricular pacer from the left with leads in stable position. Low volumes with chronic mild elevation of the left diaphragm. Mild streaky left infrahilar opacity and lateral left costophrenic sulcus blunting, chronic. There is no edema, consolidation, effusion, or pneumothorax. Chronic cardiomegaly. IMPRESSION: 1. No acute finding when compared to prior. 2. Chronic mild left base scarring and diaphragm elevation. Electronically Signed   By: Monte Fantasia M.D.   On: 10/26/2016 14:27    EKG: Independently reviewed. Ventricular-paced rhythm.   Assessment/Plan  1. NSTEMI   - Pt presents with chest pain that resolved with 1 NTG prior to arrival; this was proceeded by 1-2 wks of left arm discomfort  - He has known CAD with stent to LAD, followed by cardiology   - He remained asymptomatic in the ED, but developed acute hypoxic respiratory failure in transport, discussed below  - EKG features a ventricular-paced rhythm without appreciable acute change  - Initial troponin elevated to 4.79  - He was treated in ED with ASA 324 and heparin infusion  - Cardiology is consulting and much appreciated, will follow-up on recommendations  - Plan to continue cardiac monitoring, continue heparin infusion, continue beta-blocker and ASA, add statin   2. Acute hypoxic respiratory failure  - Pt was reportedly asymptomatic from respiratory perspective while at [facility] ED, but arrives in acute distress with tachypnea and hypoxia, placed on BiPAP by rapid response RN  - CXR repeated with increased infiltrate in right base  - Concern for acute pulm edema in setting of NSTEMI and treated with Lasix IVP  - Possible PNA, though onset quite acute and no leukocytosis or recent cough; will check sputum culture and strep pneumo and legionella antingens  - Continue supportive care with supplemental O2, BiPAP prn  3. Acute kidney injury superimposed on CKD stage III  - SCr is 2.55 on  admission, up from apparent baseline of ~2  - Check urine studies, hold losartan, follow daily wts and I/O's, repeat chem panel in am   4. Atrial fibrillation  - In a paced-rhythm on arrival  - CHADS-VASc is 62 (age x2, CAD, HTN, DM, CHF) - Managed with warfarin, INR 1.67 on admission, currently on heparin infusion, continue warfarin with pharmacy assistance  - He is being monitored on telemetry   5. Insulin-dependent DM  - A1c 7.0% in July 2018 - Managed at home with Novolog 70/30 5 units BID  - Check CBG q4h and start a low-intensity SSI with Novolog    6. Hypertension  - BP at goal  - Continue Norvasc and Coreg, hold losartan in light of AKI and hyperkalemia    7. Hyperkalemia  - Serum potassium mildly elevated to 5.3  - Treated with Lasix IVP on arrival to [facility] - Continue cardiac monitoring, hold losartan, follow serial potassium levels until normalized      DVT prophylaxis: heparin infusion  Code Status: DNR Family Communication: Discussed with patient Disposition Plan: Admit to SDU Consults called: Cardiology Admission status: Inpatient    Vianne Bulls, MD [facility] Pager [phone]  If 7PM-7AM, please contact night-coverage [email or web address] Password [facility]  10/26/2016, 9:44 PM

## 2016-10-26 NOTE — Progress Notes (Signed)
Rt called to bedside by rapid response. Pt had increase WOB and sats of low 85%. Audible wheezing noted and coarse crackles to R lung base with scattered rhonchi up to mid R lung.  Placed pt on bipap 12/6, rate of 8, at 100%. Pt sat 94% and bipap decreased WOB. Rt will cont to monitor.

## 2016-10-26 NOTE — ED Provider Notes (Signed)
[facility] DEPT [facility] Provider Note   CSN: [id number] Arrival date & time: 10/26/16  1244     History   Chief Complaint Chief Complaint  Patient presents with  . Chest Pain    HPI DEMARQUES PILZ is a 82 y.o. male.  The history is provided by the patient. No language interpreter was used.  Chest Pain     KENTON FORTIN is a 82 y.o. male who presents to the Emergency Department complaining of chest pain.  He reports tingling in the left arm for the last two weeks, waxing and waning in nature.  Tingling would improve with advil, worse with exercise.  Today, about two hours prior to arrival he developed left sided chest pain, aching in nature.  Pain lasted about one hour and resolved when he took nitroglycerin.  He reports associated sob.  He is asymptomatic on ED arrival.  He had poor sleep last night due to sob and arm tingling.  Sxs are moderate in nature.    Past Medical History:  Diagnosis Date  . Arthritis   . Atrial fibrillation ([place])   . Benign prostatic hypertrophy   . CAD (coronary artery disease)    a. s/p mid LAD stenting with DES 2008 Dr. Daneen Schick  . Chronic systolic dysfunction of left ventricle    a. mixed ischemic and nonischemic CM,  EF 35%. b. s/p AICD implantation.  . Constipation   . Depression   . ED (erectile dysfunction)   . Gout   . History of MRSA infection   . Hypertension    takes Carvedilol and Losartan daily  . Insomnia   . PAD (peripheral artery disease) (HCC)    s/p multiple LLE bypass grafts; left mid-distal SCA occlusion by 08/2012 duplex  . Renal artery stenosis ([place])    s/p stenting 2009  . Sleep apnea    hx of "had surgery for"  . Type II diabetes mellitus [facility])     Patient Active Problem List   Diagnosis Date Noted  . 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 Decubitus ulcer of sacral area 08/17/2015  . Phantom limb syndrome with pain ([place]) 08/03/2014  . Diabetic polyneuropathy associated  with diabetes mellitus due to underlying condition ([place]) 07/12/2014  . CVA (cerebral infarction) 06/15/2014  . Expressive aphasia 06/15/2014  . CKD (chronic kidney disease) stage 4, GFR 15-29 ml/min (HCC) 06/15/2014  . DM (diabetes mellitus), type 2, uncontrolled, with renal complications ([place]) 73/53/2992  . History of left above knee amputation ([place]) 02/09/2013  . S/P ICD (internal cardiac defibrillator) procedure 05/18/2012  . PAD (peripheral artery disease) ([place]) 09/04/2011  . CAD (coronary artery disease)   . Chronic systolic congestive heart failure ([place]) 07/06/2010  . Gout 09/18/2007  . Essential hypertension 01/23/2007  . Chronic atrial fibrillation ([place]) 01/23/2007  . BPH (benign prostatic hyperplasia) 01/23/2007    Past Surgical History:  Procedure Laterality Date  . ABDOMINAL ANGIOGRAM  12/25/2011   Procedure: ABDOMINAL ANGIOGRAM;  Surgeon: Serafina Mitchell, MD;  Location: [facility] CATH LAB;  Service: Cardiovascular;;  . ABDOMINAL AORTAGRAM N/A 09/11/2011   Procedure: ABDOMINAL Maxcine Ham;  Surgeon: Wellington Hampshire, MD;  Location: [place] CATH LAB;  Service: Cardiovascular;  Laterality: N/A;  . ADENOIDECTOMY     Hx; of   . AMPUTATION Left 02/04/2013   Procedure: AMPUTATION ABOVE KNEE-LEFT;  Surgeon: Mal Misty, MD;  Location: [place];  Service: Vascular;  Laterality: Left;  . BICEPS TENDON REPAIR Right  2001   Archie Endo 03/20/2001 (07/10/2012)  . BIV ICD GENERTAOR CHANGE OUT N/A 11/09/2013   Procedure: BIV ICD GENERTAOR CHANGE OUT;  Surgeon: Coralyn Mark, MD;  Location: [facility] CATH LAB;  Service: Cardiovascular;  Laterality: N/A;  . CARDIAC DEFIBRILLATOR PLACEMENT  12/26/09   pacemaker combo  . CARPAL TUNNEL RELEASE Right 2002   Archie Endo 03/20/2001 (07/10/2012)  . cervical epidural injection  2013  . COLONOSCOPY     Hx; of  . CORONARY ANGIOPLASTY    . CORONARY ANGIOPLASTY WITH STENT PLACEMENT  ~ 2000  . EMBOLECTOMY  02/07/2012   Procedure: EMBOLECTOMY;  Surgeon: Rosetta Posner, MD;  Location: [place];  Service: Vascular;  Laterality: Left;  . EMBOLECTOMY Left 12/16/2012   Procedure: THROMBECTOMY  LEFT LEG BYPASS;  Surgeon: Elam Dutch, MD;  Location: [place];  Service: Vascular;  Laterality: Left;  . FEMORAL-POPLITEAL BYPASS GRAFT Left 09/16/2012   Procedure: LEFT FEMORAL-POPLITEAL BYPASS GRAFT WITH GORTEX Propaten Graft 6x80 Thin Wall and Left lower leg Angiogram;  Surgeon: Mal Misty, MD;  Location: [place];  Service: Vascular;  Laterality: Left;  . FEMORAL-TIBIAL BYPASS GRAFT  09/25/2011   Procedure: BYPASS GRAFT FEMORAL-TIBIAL ARTERY;  Surgeon: Mal Misty, MD;  Location: [facility] OR;  Service: Vascular;  Laterality: Left;  Left Femoral - Anterior Tibial Bypass;  saphenous vein graft from left leg  . FEMORAL-TIBIAL BYPASS GRAFT  02/07/2012   Procedure: BYPASS GRAFT FEMORAL-TIBIAL ARTERY;  Surgeon: Rosetta Posner, MD;  Location: [place];  Service: Vascular;  Laterality: Left;  Thrombectomy Left Femoral - Tibial Bypass Graft  . FEMORAL-TIBIAL BYPASS GRAFT  04/03/2012   Procedure: BYPASS GRAFT FEMORAL-TIBIAL ARTERY;  Surgeon: Mal Misty, MD;  Location: [place];  Service: Vascular;  Laterality: Left;  Redo  . FEMORAL-TIBIAL BYPASS GRAFT Left 09/30/2012   Procedure: REDO LEFT FEMORAL-ANTERIOR TIBIAL ARTERY BYPASS USING COMPOSITE CEPHALIC AND BASILIC VEIN GRAFT FROM LEFT ARM;  Surgeon: Mal Misty, MD;  Location: [place];  Service: Vascular;  Laterality: Left;  . FOOT SURGERY Right 03/20/2001   "have plates and screws in; didn't break it" (07/10/2012)  . GROIN DEBRIDEMENT Left 11/12/2012   Procedure: CLOSURE INGUINAL WOUND;  Surgeon: Mal Misty, MD;  Location: [place];  Service: Vascular;  Laterality: Left;  . I&D EXTREMITY Left 10/21/2012   Procedure: EXPLORATION AND DEBRIDEMENT OF LEFT GROIN WOUND;  Surgeon: Mal Misty, MD;  Location: [place];  Service: Vascular;  Laterality: Left;  . INSERT / REPLACE / REMOVE PACEMAKER  2007  . INTRAOPERATIVE ARTERIOGRAM  09/25/2011   Procedure: INTRA  OPERATIVE ARTERIOGRAM;  Surgeon: Mal Misty, MD;  Location: [place];  Service: Vascular;  Laterality: Left;  . LEG AMPUTATION ABOVE KNEE Left 02/04/2013   DR LAWSON  . LOWER EXTREMITY ANGIOGRAM Left 12/25/2011   Procedure: LOWER EXTREMITY ANGIOGRAM;  Surgeon: Serafina Mitchell, MD;  Location: [facility] CATH LAB;  Service: Cardiovascular;  Laterality: Left;  lt leg angio co2  . LOWER EXTREMITY ANGIOGRAM N/A 07/07/2012   Procedure: LOWER EXTREMITY ANGIOGRAM;  Surgeon: Conrad Quakertown, MD;  Location: [facility] CATH LAB;  Service: Cardiovascular;  Laterality: N/A;  . LOWER EXTREMITY ANGIOGRAM N/A 09/15/2012   Procedure: LOWER EXTREMITY ANGIOGRAM;  Surgeon: Serafina Mitchell, MD;  Location: [facility] CATH LAB;  Service: Cardiovascular;  Laterality: N/A;  . PATCH ANGIOPLASTY Left 10/21/2012   Procedure: PATCH ANGIOPLASTY;  Surgeon: Mal Misty, MD;  Location: [place];  Service: Vascular;  Laterality: Left;  .  PERIPHERAL VASCULAR CATHETERIZATION N/A 11/08/2015   Procedure: Abdominal Aortogram w/Lower Extremity;  Surgeon: Waynetta Sandy, MD;  Location: [place] CV LAB;  Service: Cardiovascular;  Laterality: N/A;  . REMOVAL OF GRAFT Left 04/16/2013   Procedure: I & D LEFT AKA WOUND, POSSIBLE REMOVAL OF INFECTED GORTEX GRAFT;  Surgeon: Mal Misty, MD;  Location: [place];  Service: Vascular;  Laterality: Left;  . RENAL ARTERY STENT  2009  . SHOULDER OPEN ROTATOR CUFF REPAIR Right 2001   repair of lacerated right/notes 10/11/1999  (07/10/2012)  . TONSILLECTOMY    . TURP VAPORIZATION    . UPPER EXTREMITY ANGIOGRAM  09/15/2012   Procedure: UPPER EXTREMITY ANGIOGRAM;  Surgeon: Serafina Mitchell, MD;  Location: [facility] CATH LAB;  Service: Cardiovascular;;  . UVULOPALATOPHARYNGOPLASTY (UPPP)/TONSILLECTOMY/SEPTOPLASTY  06/30/2003   Archie Endo 06/30/2003 (07/10/2012)       Home Medications    Prior to Admission medications   Medication Sig Start Date End Date Taking? Authorizing Provider  allopurinol (ZYLOPRIM) 100 MG tablet Take 1 tablet  by mouth daily as needed for gout. 08/07/16   Laurey Morale, MD  amLODipine (NORVASC) 5 MG tablet Take 1 tablet by mouth daily 07/17/16   Laurey Morale, MD  aspirin EC 81 MG tablet Take 81 mg by mouth daily.    [provider]  carvedilol (COREG) 25 MG tablet Take 1 tablet by mouth twice a day with a meal 08/07/16   Laurey Morale, MD  finasteride (PROSCAR) 5 MG tablet Take 1 tablet by mouth daily 08/07/16   Laurey Morale, MD  furosemide (LASIX) 40 MG tablet Take 40 mg by mouth daily.    [provider]  gabapentin (NEURONTIN) 300 MG capsule Take 2 capsules (600 mg total) by mouth 3 (three) times daily. 08/17/15   Laurey Morale, MD  insulin aspart protamine- aspart (NOVOLOG MIX 70/30) (70-30) 100 UNIT/ML injection Inject 5 Units into the skin 2 (two) times daily with a meal.  03/03/13   Dutch Quint B, FNP  Insulin Pen Needle 31G X 8 MM MISC Use 2-3 times per day and diagnosis code is 250.00 11/25/13   Laurey Morale, MD  losartan (COZAAR) 100 MG tablet Take 1 tablet by mouth daily 07/17/16   Laurey Morale, MD  nitroGLYCERIN (NITROSTAT) 0.4 MG SL tablet Place 1 tablet (0.4 mg total) under the tongue every 5 (five) minutes as needed for chest pain. For chest pain, max 3 doses 05/28/13   Laurey Morale, MD  ONE TOUCH ULTRA TEST test strip USE TO TEST BLOOD SUGAR TWO TIMES DAILY AS INSTRUCTED BY PHYSICIAN. 03/07/16   Laurey Morale, MD  oxyCODONE-acetaminophen (PERCOCET) 10-325 MG tablet Take 1 tablet by mouth every 6 (six) hours as needed for pain. May take an extra tablet when pain is severe. Only ten days out of the month 10/11/16   Laurey Morale, MD  tamsulosin (FLOMAX) 0.4 MG CAPS capsule Take 1 capsule (0.4 mg total) by mouth daily. 08/02/16   Laurey Morale, MD  warfarin (COUMADIN) 5 MG tablet Take 1/2 tablet daily OR TAKE AS DIRECTED BY ANTICOAGULATION CLINIC 09/20/16   Laurey Morale, MD    Family History Family History  Problem Relation Age of Onset  . Cancer Father   . Cancer  Unknown        breast/fhx  . Heart disease Unknown        fhx  . Diabetes Neg Hx     Social  History Social History  Substance Use Topics  . Smoking status: Current Some Day Smoker    Types: Cigars  . Smokeless tobacco: Never Used     Comment: 1 cigar each month  . Alcohol use 1.8 oz/week    3 Glasses of wine per week     Allergies   Other and Bactrim ds [sulfamethoxazole-trimethoprim]   Review of Systems Review of Systems  Cardiovascular: Positive for chest pain.  All other systems reviewed and are negative.    Physical Exam Updated Vital Signs BP 136/67   Pulse 61   Temp 97.7 F (36.5 C) (Oral)   Resp 16   SpO2 94%   Physical Exam  Constitutional: He is oriented to person, place, and time. He appears well-developed and well-nourished.  HENT:  Head: Normocephalic and atraumatic.  Cardiovascular: Normal rate and regular rhythm.   No murmur heard. Pulmonary/Chest: Effort normal and breath sounds normal. No respiratory distress.  Abdominal: Soft. There is no tenderness. There is no rebound and no guarding.  Musculoskeletal:  Trace edema to RLE, LLE AKA.  Neurological: He is alert and oriented to person, place, and time.  Skin: Skin is warm and dry.  Psychiatric: He has a normal mood and affect. His behavior is normal.  Nursing note and vitals reviewed.    ED Treatments / Results  Labs (all labs ordered are listed, but only abnormal results are displayed) Labs Reviewed  CBC - Abnormal; Notable for the following:       Result Value   RDW 15.6 (*)    All other components within normal limits  BASIC METABOLIC PANEL - Abnormal; Notable for the following:    Potassium 5.3 (*)    CO2 20 (*)    Glucose, Bld 127 (*)    BUN 55 (*)    Creatinine, Ser 2.55 (*)    Calcium 8.8 (*)    GFR calc non Af Amer 22 (*)    GFR calc Af Amer 26 (*)    All other components within normal limits  TROPONIN I - Abnormal; Notable for the following:    Troponin I 4.79 (*)     All other components within normal limits  PROTIME-INR - Abnormal; Notable for the following:    Prothrombin Time 19.9 (*)    All other components within normal limits  HEPARIN LEVEL (UNFRACTIONATED)    EKG  EKG Interpretation None       Radiology Dg Chest 2 View  Result Date: 10/26/2016 CLINICAL DATA:  Chest pain EXAM: CHEST  2 VIEW COMPARISON:  03/22/2016 FINDINGS: Biventricular pacer from the left with leads in stable position. Low volumes with chronic mild elevation of the left diaphragm. Mild streaky left infrahilar opacity and lateral left costophrenic sulcus blunting, chronic. There is no edema, consolidation, effusion, or pneumothorax. Chronic cardiomegaly. IMPRESSION: 1. No acute finding when compared to prior. 2. Chronic mild left base scarring and diaphragm elevation. Electronically Signed   By: Monte Fantasia M.D.   On: 10/26/2016 14:27    Procedures Procedures (including critical care time)  Medications Ordered in ED Medications  heparin bolus via infusion 2,000 Units (not administered)  heparin ADULT infusion 100 units/mL (25000 units/230mL sodium chloride 0.45%) (not administered)  aspirin chewable tablet 324 mg (324 mg Oral Given 10/26/16 1452)     Initial Impression / Assessment and Plan / ED Course  I have reviewed the triage vital signs and the nursing notes.  Pertinent labs & imaging results that were available during  my care of the patient were reviewed by me and considered in my medical decision making (see chart for details).     Patient with history See daily, PVD, CAD, CHF here for 1 week of intermittent left arm tingling as well as left-sided chest pain that began today. He is completely pain-free in the emergency department. EKG with paced rhythm, no significant change compared to priors. BMP demonstrates renal insufficiency, slightly worsening. Troponin is markedly elevated at 4.7. He was treated with aspirin 324 mg, heparin drip. Discussed with Dr.  Radford Pax with cardiology, who recommends admission on the medicine service given his medical comorbidities to [facility]. D/w Hospitalist, Dr. Florene Glen regarding admission.   Patient updated of findings of studies and recommendation for admission and he is in agreement with plan.  Final Clinical Impressions(s) / ED Diagnoses   Final diagnoses:  NSTEMI (non-ST elevated myocardial infarction) [facility])    New Prescriptions New Prescriptions   No medications on file     Quintella Reichert, MD 10/26/16 1558

## 2016-10-26 NOTE — ED Notes (Signed)
Report to Costella Hatcher, Therapist, sports at [facility].

## 2016-10-26 NOTE — Significant Event (Signed)
Rapid Response Event Note  Overview: Time Called: 2048 Arrival Time: 2053 Event Type: Respiratory, Cardiac  Initial Focused Assessment:  82 y.o. Male DNR pt recently transferred from [facility] for L sided CP presents with acute SOB, increased WOB, audible wheezing and diaphoresis.  Called by 3W charge RN [place], respiratory therapy and Dr. Myna Hidalgo notified.  Pt initially in tripod position on my arrival, tachypneic, diaphoretic and c/o SOB with increased WOB.  On 4 L [place] with sats 88-94%.  Audible wheezing bilaterally, coarse crackles to R lung base with scattered rhonchi up to mid R lung.  Denies chest pain.  2 + radial pulses, rate controlled, V paced on monitor.  BP 107/65. AAO x 4.    Interventions:  Bipap started with improvement in WOB.  12 lead EKG conducted with no change from prior, remains v paced.  Stat PCXR which showed increasing right basilar infiltrate compared to prior exam.  Troponin drawn and sent.  Pt upgraded to SDU status.    Plan of Care (if not transferred):  Dr. Myna Hidalgo at bedside, consulting with cardiology.  Pt remains on heparin drip.  Continues to be chest pain free at the moment.  Continuing bipap, MD ordering lasix IVP d/t concern for acute pulmonary edema in setting of NSTEMI.   Event Summary: Name of Physician Notified: [place] at 2055    at          [facility], Jeneen Rinks

## 2016-10-26 NOTE — Progress Notes (Signed)
ANTICOAGULATION CONSULT NOTE - Initial Consult  Pharmacy Consult for warfarin Indication: atrial fibrillation  Allergies  Allergen Reactions  . Other Other (See Comments)    Plastic tape tears skin  . Bactrim Ds [Sulfamethoxazole-Trimethoprim] Other (See Comments)    Hand tremors    Patient Measurements:     Vital Signs: Temp: 97.5 F (36.4 C) (08/25 2056) Temp Source: Axillary (08/25 2056) BP: 107/65 (08/25 2056) Pulse Rate: 76 (08/25 2056)  Labs:  Recent Labs  10/26/16 1322 10/26/16 1400  HGB 13.3  --   HCT 39.8  --   PLT 166  --   LABPROT  --  19.9*  INR  --  1.67  CREATININE  --  2.55*  TROPONINI  --  4.79*    Estimated Creatinine Clearance: 24.2 mL/min (A) (by C-G formula based on SCr of 2.55 mg/dL (H)).  Assessment: 58 YOM with AFib on warfarin PTA (home dose 2.5mg  daily per last Coumadin Clinic note). INR subtherapeutic at 1.67- started on IV heparin. Hgb 13.3, plts 166- no bleeding noted.  Goal of Therapy:  INR 2-3 Heparin level 0.3-0.7 units/ml Monitor platelets by anticoagulation protocol: Yes   Plan:  Warfarin 5mg  po x1 tonight  Daily INR   Shanielle Correll D. Kathyann Spaugh, PharmD, BCPS Clinical Pharmacist 10/26/2016 9:48 PM

## 2016-10-26 NOTE — ED Triage Notes (Addendum)
Pt reports left sided chest pain PTA - states took 1 Nitro that relieved his pain - pt has a pacer. No chest pain at present. Hx of left arm pain one week prior. States dizzy and diaphoretic last night.

## 2016-10-26 NOTE — Progress Notes (Signed)
ANTICOAGULATION CONSULT NOTE - Initial Consult  Pharmacy Consult for Heparin Indication: chest pain/ACS  Allergies  Allergen Reactions  . Other Other (See Comments)    Plastic tape tears skin  . Bactrim Ds [Sulfamethoxazole-Trimethoprim] Other (See Comments)    Hand tremors    Patient Measurements: IBW: 68.4 kg TBW 82.5 kg Heparin Dosing Weight:  81 kg  Vital Signs: Temp: 97.7 F (36.5 C) (08/25 1258) Temp Source: Oral (08/25 1258) BP: 136/67 (08/25 1531) Pulse Rate: 61 (08/25 1531)  Labs:  Recent Labs  10/26/16 1322 10/26/16 1400  HGB 13.3  --   HCT 39.8  --   PLT 166  --   LABPROT  --  19.9*  INR  --  1.67  CREATININE  --  2.55*  TROPONINI  --  4.79*    Estimated Creatinine Clearance: 24.2 mL/min (A) (by C-G formula based on SCr of 2.55 mg/dL (H)).   Medical History: Past Medical History:  Diagnosis Date  . Arthritis   . Atrial fibrillation ([place])   . Benign prostatic hypertrophy   . CAD (coronary artery disease)    a. s/p mid LAD stenting with DES 2008 Dr. Daneen Schick  . Chronic systolic dysfunction of left ventricle    a. mixed ischemic and nonischemic CM,  EF 35%. b. s/p AICD implantation.  . Constipation   . Depression   . ED (erectile dysfunction)   . Gout   . History of MRSA infection   . Hypertension    takes Carvedilol and Losartan daily  . Insomnia   . PAD (peripheral artery disease) (HCC)    s/p multiple LLE bypass grafts; left mid-distal SCA occlusion by 08/2012 duplex  . Renal artery stenosis ([place])    s/p stenting 2009  . Sleep apnea    hx of "had surgery for"  . Type II diabetes mellitus (HCC)     Assessment: CC/HPI: CP, elevated troponin  PMH: CAD, HF, Afib, AICD, PVD, R leg claudication and LLE AKA, HTN, noncompliant, noncompliant, BPH, constipation, depression, ED, gout, h/o MRSA, insomnia, PAD, RAS, OSA, DM  Anticoag: CP. INR elevated 1.67 on warfarin for afib. Baseline cBC WNL.  Goal of Therapy:  Heparin level 0.3-0.7  units/ml Monitor platelets by anticoagulation protocol: Yes   Plan:  Heparin 2000 unit bolus Heparin infusion at 1100 units/hr Check heparin level in 6-8 hrs. Daily HL and CBC   Redina Zeller S. Alford Highland, PharmD, BCPS Clinical Staff Pharmacist Pager [phone]  Eilene Ghazi Stillinger 10/26/2016,3:41 PM

## 2016-10-27 ENCOUNTER — Inpatient Hospital Stay (HOSPITAL_COMMUNITY): Payer: PPO

## 2016-10-27 DIAGNOSIS — J9601 Acute respiratory failure with hypoxia: Secondary | ICD-10-CM

## 2016-10-27 DIAGNOSIS — N179 Acute kidney failure, unspecified: Secondary | ICD-10-CM

## 2016-10-27 DIAGNOSIS — I4891 Unspecified atrial fibrillation: Secondary | ICD-10-CM

## 2016-10-27 DIAGNOSIS — E1121 Type 2 diabetes mellitus with diabetic nephropathy: Secondary | ICD-10-CM

## 2016-10-27 DIAGNOSIS — G4733 Obstructive sleep apnea (adult) (pediatric): Secondary | ICD-10-CM

## 2016-10-27 DIAGNOSIS — I2510 Atherosclerotic heart disease of native coronary artery without angina pectoris: Secondary | ICD-10-CM

## 2016-10-27 DIAGNOSIS — N183 Chronic kidney disease, stage 3 (moderate): Secondary | ICD-10-CM

## 2016-10-27 LAB — EXPECTORATED SPUTUM ASSESSMENT W GRAM STAIN, RFLX TO RESP C

## 2016-10-27 LAB — POTASSIUM
POTASSIUM: 4.8 mmol/L (ref 3.5–5.1)
POTASSIUM: 5 mmol/L (ref 3.5–5.1)
Potassium: 5.2 mmol/L — ABNORMAL HIGH (ref 3.5–5.1)
Potassium: 4.8 mmol/L (ref 3.5–5.1)
Potassium: 5.2 mmol/L — ABNORMAL HIGH (ref 3.5–5.1)

## 2016-10-27 LAB — CBC
HCT: 41.7 % (ref 39.0–52.0)
HEMOGLOBIN: 13.5 g/dL (ref 13.0–17.0)
MCH: 27.3 pg (ref 26.0–34.0)
MCHC: 32.4 g/dL (ref 30.0–36.0)
MCV: 84.2 fL (ref 78.0–100.0)
PLATELETS: 185 10*3/uL (ref 150–400)
RBC: 4.95 MIL/uL (ref 4.22–5.81)
RDW: 15.8 % — AB (ref 11.5–15.5)
WBC: 16.6 10*3/uL — ABNORMAL HIGH (ref 4.0–10.5)

## 2016-10-27 LAB — BASIC METABOLIC PANEL
ANION GAP: 14 (ref 5–15)
BUN: 60 mg/dL — AB (ref 6–20)
CALCIUM: 9 mg/dL (ref 8.9–10.3)
CO2: 16 mmol/L — ABNORMAL LOW (ref 22–32)
Chloride: 106 mmol/L (ref 101–111)
Creatinine, Ser: 2.98 mg/dL — ABNORMAL HIGH (ref 0.61–1.24)
GFR calc Af Amer: 21 mL/min — ABNORMAL LOW (ref >60)
GFR, EST NON AFRICAN AMERICAN: 18 mL/min — AB (ref >60)
Glucose, Bld: 137 mg/dL — ABNORMAL HIGH (ref 65–99)
POTASSIUM: 6.7 mmol/L — AB (ref 3.5–5.1)
SODIUM: 136 mmol/L (ref 135–145)

## 2016-10-27 LAB — EXPECTORATED SPUTUM ASSESSMENT W REFEX TO RESP CULTURE

## 2016-10-27 LAB — URINALYSIS, ROUTINE W REFLEX MICROSCOPIC
BILIRUBIN URINE: NEGATIVE
Glucose, UA: NEGATIVE mg/dL
Ketones, ur: NEGATIVE mg/dL
LEUKOCYTES UA: NEGATIVE
Nitrite: NEGATIVE
PROTEIN: NEGATIVE mg/dL
Specific Gravity, Urine: 1.011 (ref 1.005–1.030)
pH: 5 (ref 5.0–8.0)

## 2016-10-27 LAB — BRAIN NATRIURETIC PEPTIDE: B NATRIURETIC PEPTIDE 5: 1249.1 pg/mL — AB (ref 0.0–100.0)

## 2016-10-27 LAB — TROPONIN I
Troponin I: 2.62 ng/mL (ref <0.03)
Troponin I: 39.06 ng/mL (ref <0.03)
Troponin I: 46.73 ng/mL (ref <0.03)

## 2016-10-27 LAB — CREATININE, URINE, RANDOM: Creatinine, Urine: 116.98 mg/dL

## 2016-10-27 LAB — GLUCOSE, CAPILLARY
GLUCOSE-CAPILLARY: 172 mg/dL — AB (ref 65–99)
GLUCOSE-CAPILLARY: 159 mg/dL — AB (ref 65–99)
Glucose-Capillary: 128 mg/dL — ABNORMAL HIGH (ref 65–99)
Glucose-Capillary: 139 mg/dL — ABNORMAL HIGH (ref 65–99)
Glucose-Capillary: 170 mg/dL — ABNORMAL HIGH (ref 65–99)
Glucose-Capillary: 182 mg/dL — ABNORMAL HIGH (ref 65–99)
Glucose-Capillary: 231 mg/dL — ABNORMAL HIGH (ref 65–99)

## 2016-10-27 LAB — NA AND K (SODIUM & POTASSIUM), RAND UR
Potassium Urine: 71 mmol/L
Sodium, Ur: 32 mmol/L

## 2016-10-27 LAB — HEPARIN LEVEL (UNFRACTIONATED)
HEPARIN UNFRACTIONATED: 0.94 [IU]/mL — AB (ref 0.30–0.70)
HEPARIN UNFRACTIONATED: 0.51 [IU]/mL (ref 0.30–0.70)
HEPARIN UNFRACTIONATED: 0.4 [IU]/mL (ref 0.30–0.70)

## 2016-10-27 LAB — PROTIME-INR
INR: 1.9
PROTHROMBIN TIME: 22 s — AB (ref 11.4–15.2)

## 2016-10-27 LAB — MRSA PCR SCREENING: MRSA by PCR: NEGATIVE

## 2016-10-27 LAB — STREP PNEUMONIAE URINARY ANTIGEN: STREP PNEUMO URINARY ANTIGEN: NEGATIVE

## 2016-10-27 MED ORDER — DEXTROSE 50 % IV SOLN
1.0000 | Freq: Once | INTRAVENOUS | Status: AC
  Administered 2016-10-27: 50 mL via INTRAVENOUS
  Filled 2016-10-27: qty 50

## 2016-10-27 MED ORDER — SODIUM POLYSTYRENE SULFONATE 15 GM/60ML PO SUSP
30.0000 g | Freq: Once | ORAL | Status: AC
  Administered 2016-10-27: 30 g via ORAL
  Filled 2016-10-27: qty 120

## 2016-10-27 MED ORDER — INSULIN GLARGINE 100 UNIT/ML ~~LOC~~ SOLN
5.0000 [IU] | Freq: Every day | SUBCUTANEOUS | Status: DC
  Administered 2016-10-27 – 2016-10-29 (×3): 5 [IU] via SUBCUTANEOUS
  Filled 2016-10-27 (×3): qty 0.05

## 2016-10-27 MED ORDER — LEVALBUTEROL HCL 0.63 MG/3ML IN NEBU
0.6300 mg | INHALATION_SOLUTION | Freq: Once | RESPIRATORY_TRACT | Status: AC
  Administered 2016-10-27: 0.63 mg via RESPIRATORY_TRACT
  Filled 2016-10-27: qty 3

## 2016-10-27 MED ORDER — LEVALBUTEROL HCL 1.25 MG/0.5ML IN NEBU
1.2500 mg | INHALATION_SOLUTION | Freq: Three times a day (TID) | RESPIRATORY_TRACT | Status: DC
  Administered 2016-10-27: 1.25 mg via RESPIRATORY_TRACT
  Filled 2016-10-27: qty 0.5

## 2016-10-27 MED ORDER — SODIUM BICARBONATE 8.4 % IV SOLN
50.0000 meq | Freq: Once | INTRAVENOUS | Status: AC
  Administered 2016-10-27: 50 meq via INTRAVENOUS
  Filled 2016-10-27: qty 50

## 2016-10-27 MED ORDER — ALBUTEROL SULFATE (2.5 MG/3ML) 0.083% IN NEBU: 10.0000 mg | INHALATION_SOLUTION | Freq: Once | RESPIRATORY_TRACT | Status: DC

## 2016-10-27 MED ORDER — INSULIN ASPART 100 UNIT/ML IV SOLN
10.0000 [IU] | Freq: Once | INTRAVENOUS | Status: AC
  Administered 2016-10-27: 10 [IU] via INTRAVENOUS

## 2016-10-27 MED ORDER — ATORVASTATIN CALCIUM 80 MG PO TABS
80.0000 mg | ORAL_TABLET | Freq: Every day | ORAL | Status: DC
  Administered 2016-10-27 – 2016-11-01 (×6): 80 mg via ORAL
  Filled 2016-10-27 (×6): qty 1

## 2016-10-27 NOTE — Progress Notes (Signed)
RT went to follow up on pt on BIPAP and pt is off BIPAP on Room air. VS: 60HR, 23RR, 129/57, 94%SPO2. Pt is stable at this time. RT will cont to monitor as needed.

## 2016-10-27 NOTE — Progress Notes (Signed)
Txtpaged Dr. Myna Hidalgo regarding Coumadin. Per MD its ok to give Coumadin at this time. Will monitor.

## 2016-10-27 NOTE — Progress Notes (Signed)
PROGRESS NOTE    [name]  JWJ:[id number] DOB: [birth year] DOA: 10/26/2016 PCP: Laurey Morale, MD   Brief Narrative:  82 y.o. male PMHx Depression, CAD S/P LAD, Chronic Systolic CHF, Atrial fibrillation on warfarin,Renal artery stenosis S/P stenting 2009, OSA, PAD S/P Left AKA, Diabetes type 2 uncontrolled with complication,  Presenting to the emergency department for evaluation of chest pain. Patient reports that he had experienced 1-2 weeks of intermittent left arm discomfort, described as an ache that was worse with exertion and better with Advil. Then today, he experienced pain in the central and left chest, described as pressure-like, nonradiating, associated with dyspnea, not associated with nausea or diaphoresis, and lasting one hour until he took one nitroglycerin. Patient denies fevers or chills, denies abdominal pain, and denies melena or hematochezia.   [facility] ED Course: Upon arrival to the [facility] ED, patient is found to be afebrile, saturating well on room air, and with vital signs stable. EKG featured a ventricular paced rhythm and chest x-ray was negative for acute cardiopulmonary disease. Chemistry panel revealed a potassium of 5.3, BUN 55, and creatinine of 2.55, up from his apparent baseline of roughly 2.0. CBC is unremarkable. INR is subtherapeutic at 1.67. Troponin is elevated to 4.79. Patient was given 324 mg aspirin and started on heparin infusion. Cardiology was consulted by the ED physician and recommended a medical admission with cardiology to consult. Patient remained hemodynamically stable and in no apparent respiratory distress and was to be admitted to the telemetry unit [facility] for ongoing evaluation and management of N-STEMI, but developed acute hypoxic respiratory failure while en route and will be admitted to the stepdown unit.    Subjective: 8/26  A/O 4, positive SOB (states does not use home O2), negative CP, negative N/V,  negative abdominal pain    Assessment & Plan:   Principal Problem:   NSTEMI (non-ST elevated myocardial infarction) ([place]) Active Problems:   Essential hypertension   Chronic atrial fibrillation (HCC)   Chronic systolic congestive heart failure (HCC)   CAD (coronary artery disease)   PAD (peripheral artery disease) (HCC)   CKD (chronic kidney disease) stage 4, GFR 15-29 ml/min (HCC)   DM (diabetes mellitus), type 2, uncontrolled, with renal complications (HCC)   AKI (acute kidney injury) ([place])   Hyperkalemia   Acute on chronic respiratory failure with hypoxia (HCC)   NSTEMI   - Pt presents with chest pain that resolved with 1 NTG prior to arrival; this was proceeded by 1-2 wks of left arm discomfort  - He has known CAD with stent to LAD, followed by cardiology   -Echocardiogram pending - Negative CP, positive SOB. - EKG features a ventricular-paced rhythm without appreciable acute change  - Troponin trending up: 4.79 ---> 46.7 - Aspirin + heparin drip   -Strict in and out -Daily weight -Amlodipine 5 mg daily -Coreg 25 mg BID - Cardiology; would like to perform cardiac catheterization when patient's renal function improves.   Atrial fibrillation ( CHADS-VASc is 6 ) - In a paced-rhythm on arrival  -- Currently on heparin drip -PTA medication Warfarin  Recent Labs Lab 10/26/16 1400 10/27/16 0605  INR 1.67 1.90  -Subtherapeutic on admission  Essential HTN -See NSTEMI    Acute hypoxic respiratory failure  - Per patient previously not on O2, however when off O2 SPO2 drops into low 80s. -Positive wheezing: Xopenex TID - Concern for acute pulm edema in setting of NSTEMI and treated with Lasix IV  1  - Possible PNA, though onset quite acute and no leukocytosis or recent cough; will check sputum culture and strep pneumo and legionella antingens  - Titrate O2 to maintain SPO2 > 93%  OSA -CPAP/BIPAP per respiratory   Acute on CKD stage III (baseline Cr ~2)  Lab Results    Component Value Date   CREATININE 2.98 (H) 10/27/2016   CREATININE 2.55 (H) 10/26/2016   CREATININE 1.96 (H) 09/13/2016  - Check urine studies, hold losartan, follow daily wts and I/O's, repeat chem panel in am    Diabetes type 2 controlled with Renal complication   -06/2351 Hemoglobin A1c= 7.0 -Lantus 5 units daily -Sensitive SSI  Hyperkalemia  - Serum potassium mildly elevated to 5.3  - Treated with Lasix IVP on arrival to [facility] - Continue cardiac monitoring, hold losartan, follow serial potassium levels until normalized       DVT prophylaxis: Heparin drip/warfarin  Code Status: DO NOT RESUSCITATE Family Communication: None Disposition Plan: Per cardiology   Consultants:  Cardiology    Procedures/Significant Events:  Echocardiogram pending   I have personally reviewed and interpreted all radiology studies and my findings are as above.  VENTILATOR SETTINGS:    Cultures 8/25 MRSA by PCR negative 8/26 urine pending 8/26 sputum pending    Antimicrobials: Anti-infectives    None       Devices    LINES / TUBES:      Continuous Infusions: . heparin 900 Units/hr (10/27/16 0650)     Objective: Vitals:   10/27/16 0736 10/27/16 0927 10/27/16 1145 10/27/16 1616  BP: (!) 132/55  (!) 106/53 (!) 109/55  Pulse: 78  64 63  Resp: (!) 23  (!) 28   Temp: 97.9 F (36.6 C)  98.8 F (37.1 C) 98.4 F (36.9 C)  TempSrc: Oral  Oral Oral  SpO2: 90% 100% 92% 92%  Weight:        Intake/Output Summary (Last 24 hours) at 10/27/16 1632 Last data filed at 10/27/16 1500  Gross per 24 hour  Intake           498.25 ml  Output              300 ml  Net           198.25 ml   Filed Weights   10/27/16 0447  Weight: 170 lb 8 oz (77.3 kg)    Examination:  General: A/O 4, positive acute respiratory distress Neck:  Negative scars, masses, torticollis, lymphadenopathy, JVD Lungs: Clear to auscultation bilaterally, positive expiratory wheeze, negative  crackles Cardiovascular: Regular rate and rhythm without murmur gallop or rub normal S1 and S2 Abdomen: negative abdominal pain, nondistended, positive soft, bowel sounds, no rebound, no ascites, no appreciable mass Extremities: No significant cyanosis, clubbing. Left AKA, stump appears well-healed. Psychiatric:  Positive depression, positive anxiety, negative fatigue, negative mania  Central nervous system:  Cranial nerves II through XII intact, tongue/uvula midline, all extremities muscle strength 5/5, sensation intact throughout, negative dysarthria, negative expressive aphasia, negative receptive aphasia.  .     Data Reviewed: Care during the described time interval was provided by me .  I have reviewed this patient's available data, including medical history, events of note, physical examination, and all test results as part of my evaluation.   CBC:  Recent Labs Lab 10/26/16 1322 10/27/16 0606  WBC 9.4 16.6*  HGB 13.3 13.5  HCT 39.8 41.7  MCV 82.7 84.2  PLT 166 614   Basic Metabolic Panel:  Recent  Labs Lab 10/26/16 1400 10/26/16 2359 10/27/16 0606 10/27/16 0808 10/27/16 0914 10/27/16 1420  NA 137  --  136  --   --   --   K 5.3* SPECIMEN HEMOLYZED. HEMOLYSIS MAY AFFECT INTEGRITY OF RESULTS. 6.7* 4.8 5.0 5.2*  CL 108  --  106  --   --   --   CO2 20*  --  16*  --   --   --   GLUCOSE 127*  --  137*  --   --   --   BUN 55*  --  60*  --   --   --   CREATININE 2.55*  --  2.98*  --   --   --   CALCIUM 8.8*  --  9.0  --   --   --    GFR: Estimated Creatinine Clearance: 19.1 mL/min (A) (by C-G formula based on SCr of 2.98 mg/dL (H)). Liver Function Tests: No results for input(s): AST, ALT, ALKPHOS, BILITOT, PROT, ALBUMIN in the last 168 hours. No results for input(s): LIPASE, AMYLASE in the last 168 hours. No results for input(s): AMMONIA in the last 168 hours. Coagulation Profile:  Recent Labs Lab 10/26/16 1400 10/27/16 0605  INR 1.67 1.90   Cardiac  Enzymes:  Recent Labs Lab 10/26/16 1400 10/26/16 2230 10/27/16 0607 10/27/16 0808  TROPONINI 4.79* 2.62* 39.06* 46.73*   BNP (last 3 results) No results for input(s): PROBNP in the last 8760 hours. HbA1C: No results for input(s): HGBA1C in the last 72 hours. CBG:  Recent Labs Lab 10/27/16 0455 10/27/16 0559 10/27/16 0733 10/27/16 1142 10/27/16 1615  GLUCAP 128* 139* 231* 172* 159*   Lipid Profile: No results for input(s): CHOL, HDL, LDLCALC, TRIG, CHOLHDL, LDLDIRECT in the last 72 hours. Thyroid Function Tests: No results for input(s): TSH, T4TOTAL, FREET4, T3FREE, THYROIDAB in the last 72 hours. Anemia Panel: No results for input(s): VITAMINB12, FOLATE, FERRITIN, TIBC, IRON, RETICCTPCT in the last 72 hours. Urine analysis:    Component Value Date/Time   COLORURINE YELLOW 10/27/2016 0648   APPEARANCEUR CLEAR 10/27/2016 0648   LABSPEC 1.011 10/27/2016 0648   PHURINE 5.0 10/27/2016 0648   GLUCOSEU NEGATIVE 10/27/2016 0648   HGBUR SMALL (A) 10/27/2016 0648   HGBUR negative 12/29/2008 0818   BILIRUBINUR NEGATIVE 10/27/2016 0648   BILIRUBINUR N 09/13/2016 1023   KETONESUR NEGATIVE 10/27/2016 0648   PROTEINUR NEGATIVE 10/27/2016 0648   UROBILINOGEN 0.2 09/13/2016 1023   UROBILINOGEN 0.2 06/15/2014 1608   NITRITE NEGATIVE 10/27/2016 0648   LEUKOCYTESUR NEGATIVE 10/27/2016 0648   Sepsis Labs: @LABRCNTIP (procalcitonin:4,lacticidven:4)  ) Recent Results (from the past 240 hour(s))  MRSA PCR Screening     Status: None   Collection Time: 10/26/16 10:47 PM  Result Value Ref Range Status   MRSA by PCR NEGATIVE NEGATIVE Final    Comment:        The GeneXpert MRSA Assay (FDA approved for NASAL specimens only), is one component of a comprehensive MRSA colonization surveillance program. It is not intended to diagnose MRSA infection nor to guide or monitor treatment for MRSA infections.   Culture, expectorated sputum-assessment     Status: None   Collection Time:  10/27/16  7:47 AM  Result Value Ref Range Status   Specimen Description SPUTUM  Final   Special Requests NONE  Final   Sputum evaluation   Final    Sputum specimen not acceptable for testing.  Please recollect.   Gram Stain Report Called to,Read Back By and Verified  With: K HUTCHINS,RN AT [id number] 10/27/16 BY L BENFIELD    Report Status 10/27/2016 FINAL  Final         Radiology Studies: Dg Chest 2 View  Result Date: 10/27/2016 CLINICAL DATA:  Shortness of breath EXAM: CHEST  2 VIEW COMPARISON:  10/26/2016 FINDINGS: Mild right upper lobe/ perihilar edema, improved. Small left pleural effusion. Cardiomegaly.  Left subclavian ICD. IMPRESSION: Cardiomegaly with mild perihilar edema, improved. Small left pleural effusion. Electronically Signed   By: Julian Hy M.D.   On: 10/27/2016 10:31   Dg Chest 2 View  Result Date: 10/26/2016 CLINICAL DATA:  Chest pain EXAM: CHEST  2 VIEW COMPARISON:  03/22/2016 FINDINGS: Biventricular pacer from the left with leads in stable position. Low volumes with chronic mild elevation of the left diaphragm. Mild streaky left infrahilar opacity and lateral left costophrenic sulcus blunting, chronic. There is no edema, consolidation, effusion, or pneumothorax. Chronic cardiomegaly. IMPRESSION: 1. No acute finding when compared to prior. 2. Chronic mild left base scarring and diaphragm elevation. Electronically Signed   By: Monte Fantasia M.D.   On: 10/26/2016 14:27   Dg Chest Port 1 View  Result Date: 10/26/2016 CLINICAL DATA:  Respiratory distress EXAM: PORTABLE CHEST 1 VIEW COMPARISON:  Chest x-ray from earlier in the same day FINDINGS: Cardiac shadow is enlarged but stable. Defibrillator is again noted and stable. Increasing right basilar infiltrate is seen. Chronic changes are again noted in the left base. IMPRESSION: Increasing right basilar infiltrate when compared with the prior exam. Electronically Signed   By: Inez Catalina M.D.   On: 10/26/2016 21:45         Scheduled Meds: . albuterol  10 mg Nebulization Once  . amLODipine  5 mg Oral Daily  . aspirin EC  81 mg Oral Daily  . atorvastatin  80 mg Oral q1800  . carvedilol  25 mg Oral BID WC  . finasteride  5 mg Oral Daily  . gabapentin  600 mg Oral BID  . insulin aspart  0-9 Units Subcutaneous Q4H  . sodium chloride flush  3 mL Intravenous Q12H   Continuous Infusions: . heparin 900 Units/hr (10/27/16 0650)     LOS: 1 day    Time spent: 40 minutes    WOODS, Geraldo Docker, MD [facility] Pager [phone]   If 7PM-7AM, please contact night-coverage [email or web address] Password [facility] 10/27/2016, 4:32 PM

## 2016-10-27 NOTE — Progress Notes (Signed)
CRITICAL VALUE ALERT  Critical Value:  K: 6.7  Provider Notified: Dr. Myna Hidalgo  Orders Received/Actions taken: Given IV dextrose 7ml, 10units IV insulin, 20mEq Sodium Bicarb, and 30g Kayexelate.

## 2016-10-27 NOTE — Progress Notes (Signed)
ANTICOAGULATION CONSULT NOTE - Follow Up Consult  Pharmacy Consult for heparin Indication: NSTEMI and Afib  Labs:  Recent Labs  10/26/16 1322 10/26/16 1400 10/26/16 2230 10/27/16 0605 10/27/16 0606  HGB 13.3  --   --   --  13.5  HCT 39.8  --   --   --  41.7  PLT 166  --   --   --  185  LABPROT  --  19.9*  --  22.0*  --   INR  --  1.67  --  1.90  --   HEPARINUNFRC  --   --   --  0.94*  --   CREATININE  --  2.55*  --   --   --   TROPONINI  --  4.79* 2.62*  --   --     Assessment: 82yo male above goal on heparin with initial dosing while Coumadin on hold; no issues with gtt or signs of bleeding per RN.  Goal of Therapy:  Heparin level 0.3-0.7 units/ml   Plan:  Will decrease heparin gtt by 3 units/kg/hr to 900 units/hr and check level in [place], PharmD, BCPS  10/27/2016,6:37 AM

## 2016-10-27 NOTE — Progress Notes (Signed)
Progress Note  Patient Name: [name] Date of Encounter: 10/27/2016  Primary Cardiologist: Dr. Daneen Schick  Subjective   No complaints of CP or SOB this am  Inpatient Medications    Scheduled Meds: . albuterol  10 mg Nebulization Once  . amLODipine  5 mg Oral Daily  . aspirin EC  81 mg Oral Daily  . atorvastatin  40 mg Oral q1800  . carvedilol  25 mg Oral BID WC  . finasteride  5 mg Oral Daily  . furosemide  20 mg Oral Daily  . gabapentin  600 mg Oral BID  . insulin aspart  0-9 Units Subcutaneous Q4H  . levalbuterol  0.63 mg Nebulization Once  . sodium chloride flush  3 mL Intravenous Q12H  . Warfarin - Pharmacist Dosing Inpatient   Does not apply q1800   Continuous Infusions: . heparin 900 Units/hr (10/27/16 0650)   PRN Meds: acetaminophen, morphine injection, nitroGLYCERIN, ondansetron (ZOFRAN) IV, oxyCODONE-acetaminophen **AND** oxyCODONE   Vital Signs    Vitals:   10/26/16 2056 10/26/16 2354 10/27/16 0447 10/27/16 0736  BP: 107/65 (!) 123/49 (!) 129/57 (!) 132/55  Pulse: 76 73 78 78  Resp: (!) 34 (!) 31 (!) 31 (!) 23  Temp: (!) 97.5 F (36.4 C) (!) 97.5 F (36.4 C)  97.9 F (36.6 C)  TempSrc: Axillary Axillary  Oral  SpO2: 96% 100% 93% 90%  Weight:   170 lb 8 oz (77.3 kg)     Intake/Output Summary (Last 24 hours) at 10/27/16 0919 Last data filed at 10/27/16 9833  Gross per 24 hour  Intake           118.25 ml  Output              300 ml  Net          -181.75 ml   Filed Weights   10/27/16 0447  Weight: 170 lb 8 oz (77.3 kg)    Telemetry    Atrial fibrillation with V pacing- Personally Reviewed  ECG    Atrial fibrillation with V paced rhythm - Personally Reviewed  Physical Exam   GEN: No acute distress.   Neck: No JVD Cardiac: RRR, no murmurs, rubs, or gallops.  Respiratory: Clear to auscultation bilaterally. GI: Soft, nontender, non-distended  MS: No edema; No deformity. Neuro:  Nonfocal  Psych: Normal affect   Labs      Chemistry Recent Labs Lab 10/26/16 1400 10/26/16 2359 10/27/16 0606 10/27/16 0808  NA 137  --  136  --   K 5.3* SPECIMEN HEMOLYZED. HEMOLYSIS MAY AFFECT INTEGRITY OF RESULTS. 6.7* 4.8  CL 108  --  106  --   CO2 20*  --  16*  --   GLUCOSE 127*  --  137*  --   BUN 55*  --  60*  --   CREATININE 2.55*  --  2.98*  --   CALCIUM 8.8*  --  9.0  --   GFRNONAA 22*  --  18*  --   GFRAA 26*  --  21*  --   ANIONGAP 9  --  14  --      Hematology Recent Labs Lab 10/26/16 1322 10/27/16 0606  WBC 9.4 16.6*  RBC 4.81 4.95  HGB 13.3 13.5  HCT 39.8 41.7  MCV 82.7 84.2  MCH 27.7 27.3  MCHC 33.4 32.4  RDW 15.6* 15.8*  PLT 166 185    Cardiac Enzymes Recent Labs Lab 10/26/16 1400 10/26/16 2230 10/27/16 8250  TROPONINI 4.79* 2.62* 39.06*   No results for input(s): TROPIPOC in the last 168 hours.   BNPNo results for input(s): BNP, PROBNP in the last 168 hours.   DDimer No results for input(s): DDIMER in the last 168 hours.   Radiology    Dg Chest 2 View  Result Date: 10/26/2016 CLINICAL DATA:  Chest pain EXAM: CHEST  2 VIEW COMPARISON:  03/22/2016 FINDINGS: Biventricular pacer from the left with leads in stable position. Low volumes with chronic mild elevation of the left diaphragm. Mild streaky left infrahilar opacity and lateral left costophrenic sulcus blunting, chronic. There is no edema, consolidation, effusion, or pneumothorax. Chronic cardiomegaly. IMPRESSION: 1. No acute finding when compared to prior. 2. Chronic mild left base scarring and diaphragm elevation. Electronically Signed   By: Monte Fantasia M.D.   On: 10/26/2016 14:27   Dg Chest Port 1 View  Result Date: 10/26/2016 CLINICAL DATA:  Respiratory distress EXAM: PORTABLE CHEST 1 VIEW COMPARISON:  Chest x-ray from earlier in the same day FINDINGS: Cardiac shadow is enlarged but stable. Defibrillator is again noted and stable. Increasing right basilar infiltrate is seen. Chronic changes are again noted in the left  base. IMPRESSION: Increasing right basilar infiltrate when compared with the prior exam. Electronically Signed   By: Inez Catalina M.D.   On: 10/26/2016 21:45    Cardiac Studies   2-D Doppler echocardiogram April 2016: Study Conclusions  - Left ventricle: The cavity size was normal. Wall thickness was increased in a pattern of mild LVH. Systolic function was vigorous. The estimated ejection fraction was in the range of 65% to 70%. - Aortic valve: There was trivial regurgitation. - Left atrium: The atrium was mildly dilated. - Right atrium: The atrium was mildly dilated.  Patient Profile     82 y.o. 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 Pt transferred to [facility] after presenting to OSH emergency department for evaluation of chest pain. Patient reports that he had experienced 1-2 weeks of intermittent left arm discomfort, described as an ache that was worse with exertion and better with Advil. Then today, he experienced pain in the central and left chest, described as pressure-like, nonradiating, associated with dyspnea, not associated with nausea or diaphoresis, and lasting one hour until he took one nitroglycerin. Pt found to have Cr 2.55, troponin 4.79 @ OSH. INR was subtherapeutic at 1.67. He has been treated w/ heparin IV gtt and is currently pain-free.  Assessment & Plan    1. CAD/NSTEMI: pt currently symptom-free. . Needs cath, but has AOCRF which may complicate the picture w/ Cr >2 currently. As long as he remains asymptomatic, would cont with medical management.  Pt currently DNR; will have to rescind temporarily for LHC - Trop 4.79>2.62> 39 - will cont to cycle troponins - coumadin on hold for now (INR 1.9) and on IV Heparin gtt - will have to wait had see if renal function improves  to see if cath is feasible.   - continue ASA 81mg  daly - increase lipitor to high dose 80mg  daily - continue BB - check 2D echo to assess LVF (normal on echo 2016)  2. AKI on CKD stage 3 - creatinine 2.98 this am (baseline 2) - renal function possibly worsened by Lasix given in ER - ARB on hold - K+ 6.7 this am and management per [facility] -  repeat after Kayexalate/Insulin/D50 is 4.8.  3. HTN: BP controlled - continue amlodipine and BB - ARB on hold for AKI  4.  Acute hypoxic respiratory failure - arrived at [facility] with tachypnea, hypoxia and placed on BiPAP - cxray c/w RLL PNA but no recent cough or fever - WBC elevated at 16.6 today - further management per TR[facility] - would avoid further lasix at present with worsening renal function - repeat cxray this am - check BNP  5. H/o ICM but last EF documented normal by echo 2016 at 65%. - repeat echo pending  6. Chronic afib with PPM - HR controlled with underlying V pacing - coumadin on hold - continue IV Heparin gtt - continue BB for rate control  Signed, Fransico Him, MD  10/27/2016, 9:19 AM

## 2016-10-27 NOTE — Progress Notes (Addendum)
ANTICOAGULATION CONSULT NOTE - Follow Up Consult  Pharmacy Consult for heparin Indication: NSTEMI and Afib  Labs:  Recent Labs  10/26/16 1322  10/26/16 1400 10/26/16 2230 10/27/16 0605 10/27/16 0606 10/27/16 0607 10/27/16 0808 10/27/16 1420  HGB 13.3  --   --   --   --  13.5  --   --   --   HCT 39.8  --   --   --   --  41.7  --   --   --   PLT 166  --   --   --   --  185  --   --   --   LABPROT  --   --  19.9*  --  22.0*  --   --   --   --   INR  --   --  1.67  --  1.90  --   --   --   --   HEPARINUNFRC  --   --   --   --  0.94*  --   --   --  0.51  CREATININE  --   --  2.55*  --   --  2.98*  --   --   --   TROPONINI  --   < > 4.79* 2.62*  --   --  39.06* 46.73*  --   < > = values in this interval not displayed.  Assessment: 82yo male with h/o Afib, ischemic heart disease with prior stenting, and AICD presents with CP and possible NSTEMI.  Heparin started. Heparin level therapeutic at 0.51. Coumadin has been discontinued.   No bleeding noted.   Goal of Therapy:  Heparin level 0.3-0.7 units/ml   Plan:  Continue heparin gtt at 900 units/hr.  Obtain 8-hr confirmatory heparin level Monitor daily heparin level and CBC  Leroy Libman, PharmD Pharmacy Resident Pager: [phone]  10/27/2016,3:14 PM

## 2016-10-27 NOTE — Progress Notes (Signed)
CRITICAL VALUE ALERT  Critical Value:  Troponin 39.06  [facility] MD made aware. Heparin gtt running. Pt is chest pain free and been NPO since last night. Made dayshift RN aware. No new orders at this time.

## 2016-10-28 ENCOUNTER — Inpatient Hospital Stay (HOSPITAL_COMMUNITY): Payer: PPO

## 2016-10-28 ENCOUNTER — Encounter: Payer: Self-pay | Admitting: Cardiology

## 2016-10-28 DIAGNOSIS — N179 Acute kidney failure, unspecified: Secondary | ICD-10-CM | POA: Diagnosis present

## 2016-10-28 DIAGNOSIS — I351 Nonrheumatic aortic (valve) insufficiency: Secondary | ICD-10-CM

## 2016-10-28 DIAGNOSIS — I5043 Acute on chronic combined systolic (congestive) and diastolic (congestive) heart failure: Secondary | ICD-10-CM | POA: Diagnosis present

## 2016-10-28 DIAGNOSIS — N189 Chronic kidney disease, unspecified: Secondary | ICD-10-CM

## 2016-10-28 LAB — ECHOCARDIOGRAM COMPLETE
AOASC: 28 cm
CHL CUP RV SYS PRESS: 43 mmHg
E decel time: 237 msec
EERAT: 12.67
FS: 25 % — AB (ref 28–44)
Height: 67.992 in
IVS/LV PW RATIO, ED: 1.02
LA ID, A-P, ES: 54 mm
LA diam end sys: 54 mm
LA vol A4C: 76 ml
LA vol index: 38.6 mL/m2
LADIAMINDEX: 2.78 cm/m2
LAVOL: 74.9 mL
LV E/e' medial: 12.67
LV PW d: 12.4 mm — AB (ref 0.6–1.1)
LV TDI E'MEDIAL: 7.7
LVEEAVG: 12.67
LVELAT: 7.97 cm/s
LVOT area: 2.54 cm2
LVOT diameter: 18 mm
Lateral S' vel: 8.79 cm/s
MV Dec: 237
MV pk E vel: 101 m/s
MVPG: 4 mmHg
MVPKAVEL: 63.1 m/s
P 1/2 time: 456 ms
Reg peak vel: 318 cm/s
TAPSE: 21.4 mm
TDI e' lateral: 7.97
TRMAXVEL: 318 cm/s
VTI: 121 cm
Weight: 2824 oz

## 2016-10-28 LAB — POTASSIUM
POTASSIUM: 4.6 mmol/L (ref 3.5–5.1)
POTASSIUM: 4.7 mmol/L (ref 3.5–5.1)
POTASSIUM: 4.7 mmol/L (ref 3.5–5.1)
POTASSIUM: 4.6 mmol/L (ref 3.5–5.1)
Potassium: 4.7 mmol/L (ref 3.5–5.1)
Potassium: 4.6 mmol/L (ref 3.5–5.1)
Potassium: 4.7 mmol/L (ref 3.5–5.1)

## 2016-10-28 LAB — CBC
HCT: 34 % — ABNORMAL LOW (ref 39.0–52.0)
Hemoglobin: 11.3 g/dL — ABNORMAL LOW (ref 13.0–17.0)
MCH: 27.8 pg (ref 26.0–34.0)
MCHC: 33.2 g/dL (ref 30.0–36.0)
MCV: 83.7 fL (ref 78.0–100.0)
Platelets: 125 10*3/uL — ABNORMAL LOW (ref 150–400)
RBC: 4.06 MIL/uL — ABNORMAL LOW (ref 4.22–5.81)
RDW: 16 % — AB (ref 11.5–15.5)
WBC: 12.6 10*3/uL — ABNORMAL HIGH (ref 4.0–10.5)

## 2016-10-28 LAB — GLUCOSE, CAPILLARY
GLUCOSE-CAPILLARY: 201 mg/dL — AB (ref 65–99)
GLUCOSE-CAPILLARY: 222 mg/dL — AB (ref 65–99)
Glucose-Capillary: 141 mg/dL — ABNORMAL HIGH (ref 65–99)
Glucose-Capillary: 135 mg/dL — ABNORMAL HIGH (ref 65–99)
Glucose-Capillary: 219 mg/dL — ABNORMAL HIGH (ref 65–99)

## 2016-10-28 LAB — COMPREHENSIVE METABOLIC PANEL
ALK PHOS: 45 U/L (ref 38–126)
ALT: 33 U/L (ref 17–63)
ANION GAP: 15 (ref 5–15)
AST: 118 U/L — ABNORMAL HIGH (ref 15–41)
Albumin: 3.3 g/dL — ABNORMAL LOW (ref 3.5–5.0)
BUN: 82 mg/dL — ABNORMAL HIGH (ref 6–20)
CALCIUM: 8.5 mg/dL — AB (ref 8.9–10.3)
CHLORIDE: 105 mmol/L (ref 101–111)
CO2: 17 mmol/L — AB (ref 22–32)
Creatinine, Ser: 3.78 mg/dL — ABNORMAL HIGH (ref 0.61–1.24)
GFR, EST AFRICAN AMERICAN: 16 mL/min — AB (ref >60)
GFR, EST NON AFRICAN AMERICAN: 14 mL/min — AB (ref >60)
Glucose, Bld: 125 mg/dL — ABNORMAL HIGH (ref 65–99)
Potassium: 4.6 mmol/L (ref 3.5–5.1)
Sodium: 137 mmol/L (ref 135–145)
Total Bilirubin: 2.4 mg/dL — ABNORMAL HIGH (ref 0.3–1.2)
Total Protein: 5.8 g/dL — ABNORMAL LOW (ref 6.5–8.1)

## 2016-10-28 LAB — URINE CULTURE: Culture: NO GROWTH

## 2016-10-28 LAB — UREA NITROGEN, URINE: Urea Nitrogen, Ur: 312 mg/dL

## 2016-10-28 LAB — HEPARIN LEVEL (UNFRACTIONATED)
HEPARIN UNFRACTIONATED: 0.14 [IU]/mL — AB (ref 0.30–0.70)
HEPARIN UNFRACTIONATED: 0.45 [IU]/mL (ref 0.30–0.70)
Heparin Unfractionated: <0.1 IU/mL — ABNORMAL LOW (ref 0.30–0.70)

## 2016-10-28 LAB — LEGIONELLA PNEUMOPHILA SEROGP 1 UR AG: L. PNEUMOPHILA SEROGP 1 UR AG: NEGATIVE

## 2016-10-28 LAB — MAGNESIUM: MAGNESIUM: 2.3 mg/dL (ref 1.7–2.4)

## 2016-10-28 MED ORDER — CARVEDILOL 12.5 MG PO TABS
12.5000 mg | ORAL_TABLET | Freq: Two times a day (BID) | ORAL | Status: DC
  Administered 2016-10-28 – 2016-10-29 (×2): 12.5 mg via ORAL
  Filled 2016-10-28 (×2): qty 1

## 2016-10-28 MED ORDER — HEPARIN (PORCINE) IN NACL 100-0.45 UNIT/ML-% IJ SOLN
1100.0000 [IU]/h | INTRAMUSCULAR | Status: DC
  Administered 2016-10-28 – 2016-10-29 (×2): 1100 [IU]/h via INTRAVENOUS
  Filled 2016-10-28 (×2): qty 250

## 2016-10-28 MED ORDER — SODIUM CHLORIDE 0.9 % IV BOLUS (SEPSIS)
250.0000 mL | Freq: Once | INTRAVENOUS | Status: AC
  Administered 2016-10-28: 250 mL via INTRAVENOUS

## 2016-10-28 MED ORDER — FUROSEMIDE 10 MG/ML IJ SOLN
40.0000 mg | Freq: Two times a day (BID) | INTRAMUSCULAR | Status: DC
  Administered 2016-10-29: 40 mg via INTRAVENOUS
  Filled 2016-10-28: qty 4

## 2016-10-28 MED ORDER — FUROSEMIDE 10 MG/ML IJ SOLN
40.0000 mg | Freq: Two times a day (BID) | INTRAMUSCULAR | Status: DC
  Administered 2016-10-28: 40 mg via INTRAVENOUS
  Filled 2016-10-28: qty 4

## 2016-10-28 MED ORDER — LEVALBUTEROL HCL 1.25 MG/0.5ML IN NEBU
1.2500 mg | INHALATION_SOLUTION | Freq: Three times a day (TID) | RESPIRATORY_TRACT | Status: DC
  Administered 2016-10-28 – 2016-10-30 (×6): 1.25 mg via RESPIRATORY_TRACT
  Filled 2016-10-28 (×7): qty 0.5

## 2016-10-28 NOTE — Progress Notes (Signed)
PROGRESS NOTE    [name]  GBT:[id number] DOB: [birth year] DOA: 10/26/2016 PCP: Laurey Morale, MD    Brief Narrative:  82 y.o.male PMHx Depression, CAD S/P LAD, Chronic Systolic CHF, Atrial fibrillation on warfarin,Renal artery stenosis S/P stenting 2009, OSA, PAD S/P Left AKA, Diabetes type 2 uncontrolled with complication,  Presenting to the emergency department for evaluation of chest pain. Patient reports that he had experienced 1-2 weeks of intermittent left arm discomfort, described as an ache that was worse with exertion and better with Advil. Then today, he experienced pain in the central and left chest, described as pressure-like, nonradiating, associated with dyspnea, not associated with nausea or diaphoresis, and lasting one hour until he took one nitroglycerin. Patient denies fevers or chills, denies abdominal pain, and denies melena or hematochezia.  [place] ED Course:Upon arrival to the [facility] ED, patient is found to be afebrile, saturating well on room air, and with vital signs stable. EKG featured a ventricular paced rhythm and chest x-ray was negative for acute cardiopulmonary disease. Chemistry panel revealed a potassium of 5.3, BUN 55, and creatinine of 2.55, up from his apparent baseline of roughly 2.0. CBC is unremarkable. INR is subtherapeutic at 1.67. Troponin is elevated to 4.79. Patient was given 324 mg aspirin and started on heparin infusion. Cardiology was consulted by the ED physician and recommended a medical admission with cardiology to consult. Patient remained hemodynamically stable and in no apparent respiratory distress and was to be admitted to the telemetry unit [facility] for ongoing evaluation and management of N-STEMI, but developed acute hypoxic respiratory failure while en route and will be admitted to the stepdown unit.    Assessment & Plan:   Principal Problem:   NSTEMI (non-ST elevated myocardial infarction)  ([place]) Active Problems:   Essential hypertension   Chronic atrial fibrillation (HCC)   Chronic systolic congestive heart failure (HCC)   CAD (coronary artery disease)   PAD (peripheral artery disease) (HCC)   CKD (chronic kidney disease) stage 4, GFR 15-29 ml/min (HCC)   DM (diabetes mellitus), type 2, uncontrolled, with renal complications (HCC)   AKI (acute kidney injury) ([place])   Hyperkalemia   Acute on chronic respiratory failure with hypoxia (HCC)   Acute on chronic combined systolic and diastolic CHF (congestive heart failure) ([place])   Acute kidney injury superimposed on CKD ([place])  #1 non-STEMI Patient presenting with substernal chest pain improved with nitroglycerin. Chest pain was preceded by one to 2 weeks of left upper extremity discomfort/pain which patient used Aleve for. Patient with history of coronary artery disease status post mid LAD stenting and DES 7106, chronic systolic heart failure with EF of 35% status post AICD presented with substernal chest pain. Cardiac enzymes elevated up as high as 46. 2-D echo from 06/16/2014 with a EF of 65-70%. 2-D echo pending. Patient with acute on chronic kidney disease and a such cardiology awaiting for renal function improving get close to baseline prior to cardiac catheterization. Continue current medical management with aspirin, Lipitor, Coreg, IV heparin. Cardiology following and appreciate input and recommendations.  #2 acute on chronic respiratory failure with hypoxia Patient had presented with acute on chronic respiratory failure with hypoxia requiring BiPAP. Patient with some improvement with shortness of breath however still with some shortness of breath with talking and minimal use of accessory muscles of respiration. Patient currently 96% on 3 L nasal cannula. Etiology of acute on chronic respiratory failure with hypoxia likely secondary to acute CHF exacerbation/volume  overload and non-STEMI. Repeat chest x-ray. Place on Lasix 40 mg IV  every 12 hours. Strict I's and O's. Daily weights. Continue cardiac regimen of aspirin, Lipitor, Norvasc, Coreg, IV heparin. Cardiology following.  #3 hyperkalemia Resolved.  #4 acute on chronic kidney disease stageIII/ IV baseline creatinine approximately 2 Questionable etiology. Likely prerenal azotemia secondary to acute on chronic CHF exacerbation versus secondary to NSAID use. Patient also with a history of renal artery stenosis. Patient sounds volume overloaded on examination. Chest x-ray from 10/27/2016 with perihilar edema. Check a urine sodium. Check a urine creatinine. Check a renal ultrasound. Place on Lasix 40 mg IV every 12 hours. Follow urine output. Patient with worsening renal function and a such will consult with nephrology for further evaluation and management.  #5 hypertension Stable. Continue Coreg, Norvasc.  #6 obstructive sleep apnea  CPAP/BiPAP per respiratory.  #7 atrial fibrillation CHA2DS2VASC score is 6  Patient is V paced. Patient was on Coumadin prior to admission however currently on heparin drip due to non-STEMI.  #8 diabetes mellitus type 2  Hemoglobin A1c was 7. 09/13/2016. CBGs have ranged from 135-182. Continue Lantus 5 units daily. Sliding scale insulin.  #9 hyperlipidemia Fasting lipid panel from 09/13/2016 with a LDL of 146. Goal LDL less than 70. Continue Lipitor.  #10 acute on chronic CHF exacerbation Patient volume overloaded on examination. Patient with some shortness of breath with talking. Repeat chest x-ray. Cardiac enzymes elevated. 2-D echo pending. Place on Lasix 40 mg IV every 12 hours. Strict I's and O's. Daily weights.   DVT prophylaxis: Heparin Code Status: DO NOT RESUSCITATE Family Communication: Updated patient. No family present. Disposition Plan: To be determined.   Consultants:   Cardiology: Dr. Hassell Done 10/26/2016  Nephrology pending  Procedures:   Renal ultrasound pending  Chest x-ray 10/26/2016,  10/27/2016  Antimicrobials:   None   Subjective: Patient sleeping however easily arousable. Patient with shortness of breath with talking. Patient states some improvement with shortness of breath since admission however not at baseline. Patient denies any chest pain. Patient states decreased urine output.  Objective: Vitals:   10/28/16 0412 10/28/16 0532 10/28/16 0729 10/28/16 0847  BP:  100/72 108/61   Pulse: 63 65 70   Resp: (!) 29     Temp:  97.8 F (36.6 C) 98.3 F (36.8 C)   TempSrc:  Oral Axillary   SpO2: 97% 98% 97% 96%  Weight:  80.1 kg (176 lb 8 oz)    Height:        Intake/Output Summary (Last 24 hours) at 10/28/16 1146 Last data filed at 10/28/16 0856  Gross per 24 hour  Intake           948.67 ml  Output              150 ml  Net           798.67 ml   Filed Weights   10/27/16 0447 10/28/16 0532  Weight: 77.3 kg (170 lb 8 oz) 80.1 kg (176 lb 8 oz)    Examination:  General exam: Some shortness of breath when talking. Respiratory system: Diffuse crackles. No wheezing. No rhonchi. Patient with some shortness of breath when talking. Minimal use of accessory muscles of respiration.  Cardiovascular system: S1 & S2 heard, RRR. Positive JVD. No murmurs rubs gallops or clicks. Trace right lower extremity edema. Gastrointestinal system: Abdomen is nondistended, soft and nontender. No organomegaly or masses felt. Normal bowel sounds heard. Central nervous system: Alert and oriented. No  focal neurological deficits. Extremities: Status post left AKA. Skin: No rashes, lesions or ulcers Psychiatry: Judgement and insight appear normal. Mood & affect appropriate.     Data Reviewed: I have personally reviewed following labs and imaging studies  CBC:  Recent Labs Lab 10/26/16 1322 10/27/16 0606 10/28/16 0704  WBC 9.4 16.6* 12.6*  HGB 13.3 13.5 11.3*  HCT 39.8 41.7 34.0*  MCV 82.7 84.2 83.7  PLT 166 185 829*   Basic Metabolic Panel:  Recent Labs Lab  10/26/16 1400  10/27/16 0606  10/27/16 1610 10/27/16 2023 10/27/16 2319 10/28/16 0704 10/28/16 1018  NA 137  --  136  --   --   --   --  137  --   K 5.3*  < > 6.7*  < > 4.8 5.2* 4.7 4.6  4.6 4.7  CL 108  --  106  --   --   --   --  105  --   CO2 20*  --  16*  --   --   --   --  17*  --   GLUCOSE 127*  --  137*  --   --   --   --  125*  --   BUN 55*  --  60*  --   --   --   --  82*  --   CREATININE 2.55*  --  2.98*  --   --   --   --  3.78*  --   CALCIUM 8.8*  --  9.0  --   --   --   --  8.5*  --   MG  --   --   --   --   --   --   --  2.3  --   < > = values in this interval not displayed. GFR: Estimated Creatinine Clearance: 15.1 mL/min (A) (by C-G formula based on SCr of 3.78 mg/dL (H)). Liver Function Tests:  Recent Labs Lab 10/28/16 0704  AST 118*  ALT 33  ALKPHOS 45  BILITOT 2.4*  PROT 5.8*  ALBUMIN 3.3*   No results for input(s): LIPASE, AMYLASE in the last 168 hours. No results for input(s): AMMONIA in the last 168 hours. Coagulation Profile:  Recent Labs Lab 10/26/16 1400 10/27/16 0605  INR 1.67 1.90   Cardiac Enzymes:  Recent Labs Lab 10/26/16 1400 10/26/16 2230 10/27/16 0607 10/27/16 0808  TROPONINI 4.79* 2.62* 39.06* 46.73*   BNP (last 3 results) No results for input(s): PROBNP in the last 8760 hours. HbA1C: No results for input(s): HGBA1C in the last 72 hours. CBG:  Recent Labs Lab 10/27/16 2038 10/28/16 0002 10/28/16 0531 10/28/16 0727 10/28/16 1143  GLUCAP 170* 182* 141* 135* 219*   Lipid Profile: No results for input(s): CHOL, HDL, LDLCALC, TRIG, CHOLHDL, LDLDIRECT in the last 72 hours. Thyroid Function Tests: No results for input(s): TSH, T4TOTAL, FREET4, T3FREE, THYROIDAB in the last 72 hours. Anemia Panel: No results for input(s): VITAMINB12, FOLATE, FERRITIN, TIBC, IRON, RETICCTPCT in the last 72 hours. Sepsis Labs: No results for input(s): PROCALCITON, LATICACIDVEN in the last 168 hours.  Recent Results (from the past 240  hour(s))  MRSA PCR Screening     Status: None   Collection Time: 10/26/16 10:47 PM  Result Value Ref Range Status   MRSA by PCR NEGATIVE NEGATIVE Final    Comment:        The GeneXpert MRSA Assay (FDA approved for NASAL specimens only), is one component of  a comprehensive MRSA colonization surveillance program. It is not intended to diagnose MRSA infection nor to guide or monitor treatment for MRSA infections.   Urine Culture     Status: None   Collection Time: 10/27/16  6:49 AM  Result Value Ref Range Status   Specimen Description URINE, RANDOM  Final   Special Requests NONE  Final   Culture NO GROWTH  Final   Report Status 10/28/2016 FINAL  Final  Culture, expectorated sputum-assessment     Status: None   Collection Time: 10/27/16  7:47 AM  Result Value Ref Range Status   Specimen Description SPUTUM  Final   Special Requests NONE  Final   Sputum evaluation   Final    Sputum specimen not acceptable for testing.  Please recollect.   Gram Stain Report Called to,Read Back By and Verified With: K HUTCHINS,RN AT 2440 10/27/16 BY L BENFIELD    Report Status 10/27/2016 FINAL  Final         Radiology Studies: Dg Chest 2 View  Result Date: 10/27/2016 CLINICAL DATA:  Shortness of breath EXAM: CHEST  2 VIEW COMPARISON:  10/26/2016 FINDINGS: Mild right upper lobe/ perihilar edema, improved. Small left pleural effusion. Cardiomegaly.  Left subclavian ICD. IMPRESSION: Cardiomegaly with mild perihilar edema, improved. Small left pleural effusion. Electronically Signed   By: Julian Hy M.D.   On: 10/27/2016 10:31   Dg Chest 2 View  Result Date: 10/26/2016 CLINICAL DATA:  Chest pain EXAM: CHEST  2 VIEW COMPARISON:  03/22/2016 FINDINGS: Biventricular pacer from the left with leads in stable position. Low volumes with chronic mild elevation of the left diaphragm. Mild streaky left infrahilar opacity and lateral left costophrenic sulcus blunting, chronic. There is no edema,  consolidation, effusion, or pneumothorax. Chronic cardiomegaly. IMPRESSION: 1. No acute finding when compared to prior. 2. Chronic mild left base scarring and diaphragm elevation. Electronically Signed   By: Monte Fantasia M.D.   On: 10/26/2016 14:27   Dg Chest Port 1 View  Result Date: 10/26/2016 CLINICAL DATA:  Respiratory distress EXAM: PORTABLE CHEST 1 VIEW COMPARISON:  Chest x-ray from earlier in the same day FINDINGS: Cardiac shadow is enlarged but stable. Defibrillator is again noted and stable. Increasing right basilar infiltrate is seen. Chronic changes are again noted in the left base. IMPRESSION: Increasing right basilar infiltrate when compared with the prior exam. Electronically Signed   By: Inez Catalina M.D.   On: 10/26/2016 21:45        Scheduled Meds: . amLODipine  5 mg Oral Daily  . aspirin EC  81 mg Oral Daily  . atorvastatin  80 mg Oral q1800  . carvedilol  25 mg Oral BID WC  . finasteride  5 mg Oral Daily  . furosemide  40 mg Intravenous Q12H  . gabapentin  600 mg Oral BID  . insulin aspart  0-9 Units Subcutaneous Q4H  . insulin glargine  5 Units Subcutaneous Daily  . levalbuterol  1.25 mg Nebulization TID  . sodium chloride flush  3 mL Intravenous Q12H   Continuous Infusions: . heparin 900 Units/hr (10/27/16 0650)     LOS: 2 days    Time spent: 30 minutes    Orion Mole, MD [facility] Pager [phone]  If 7PM-7AM, please contact night-coverage [email or web address] Password [facility] 10/28/2016, 11:46 AM

## 2016-10-28 NOTE — Progress Notes (Signed)
Progress Note  Patient Name: [name] Date of Encounter: 10/28/2016  Primary Cardiologist: Dr. Daneen Schick  Subjective   No complaints of CP or SOB this am  Inpatient Medications    Scheduled Meds: . amLODipine  5 mg Oral Daily  . aspirin EC  81 mg Oral Daily  . atorvastatin  80 mg Oral q1800  . carvedilol  25 mg Oral BID WC  . finasteride  5 mg Oral Daily  . gabapentin  600 mg Oral BID  . insulin aspart  0-9 Units Subcutaneous Q4H  . insulin glargine  5 Units Subcutaneous Daily  . levalbuterol  1.25 mg Nebulization TID  . sodium chloride flush  3 mL Intravenous Q12H   Continuous Infusions: . heparin 900 Units/hr (10/27/16 0650)   PRN Meds: acetaminophen, morphine injection, nitroGLYCERIN, ondansetron (ZOFRAN) IV, oxyCODONE-acetaminophen **AND** oxyCODONE   Vital Signs    Vitals:   10/28/16 0412 10/28/16 0532 10/28/16 0729 10/28/16 0847  BP:  100/72 108/61   Pulse: 63 65 70   Resp: (!) 29     Temp:  97.8 F (36.6 C) 98.3 F (36.8 C)   TempSrc:  Oral Axillary   SpO2: 97% 98% 97% 96%  Weight:  80.1 kg (176 lb 8 oz)    Height:        Intake/Output Summary (Last 24 hours) at 10/28/16 1116 Last data filed at 10/28/16 0856  Gross per 24 hour  Intake           948.67 ml  Output              150 ml  Net           798.67 ml   Filed Weights   10/27/16 0447 10/28/16 0532  Weight: 77.3 kg (170 lb 8 oz) 80.1 kg (176 lb 8 oz)    Telemetry    V pacing- Personally Reviewed   Physical Exam   GEN: WD/WN, No acute distress.   Neck: supple Cardiac: RRR Respiratory: mild basilar crackles GI: Soft, nontender, non-distended  MS: 1+ ankle edema Neuro:  Nonfocal  Psych: Normal affect   Labs    Chemistry Recent Labs Lab 10/26/16 1400  10/27/16 0606  10/27/16 2319 10/28/16 0704 10/28/16 1018  NA 137  --  136  --   --  137  --   K 5.3*  < > 6.7*  < > 4.7 4.6  4.6 4.7  CL 108  --  106  --   --  105  --   CO2 20*  --  16*  --   --  17*  --     GLUCOSE 127*  --  137*  --   --  125*  --   BUN 55*  --  60*  --   --  82*  --   CREATININE 2.55*  --  2.98*  --   --  3.78*  --   CALCIUM 8.8*  --  9.0  --   --  8.5*  --   PROT  --   --   --   --   --  5.8*  --   ALBUMIN  --   --   --   --   --  3.3*  --   AST  --   --   --   --   --  118*  --   ALT  --   --   --   --   --  33  --   ALKPHOS  --   --   --   --   --  45  --   BILITOT  --   --   --   --   --  2.4*  --   GFRNONAA 22*  --  18*  --   --  14*  --   GFRAA 26*  --  21*  --   --  16*  --   ANIONGAP 9  --  14  --   --  15  --   < > = values in this interval not displayed.   Hematology  Recent Labs Lab 10/26/16 1322 10/27/16 0606 10/28/16 0704  WBC 9.4 16.6* 12.6*  RBC 4.81 4.95 4.06*  HGB 13.3 13.5 11.3*  HCT 39.8 41.7 34.0*  MCV 82.7 84.2 83.7  MCH 27.7 27.3 27.8  MCHC 33.4 32.4 33.2  RDW 15.6* 15.8* 16.0*  PLT 166 185 125*    Cardiac Enzymes  Recent Labs Lab 10/26/16 1400 10/26/16 2230 10/27/16 0607 10/27/16 0808  TROPONINI 4.79* 2.62* 39.06* 46.73*    BNP  Recent Labs Lab 10/27/16 1413  BNP 1,249.1*     Radiology    Dg Chest 2 View  Result Date: 10/27/2016 CLINICAL DATA:  Shortness of breath EXAM: CHEST  2 VIEW COMPARISON:  10/26/2016 FINDINGS: Mild right upper lobe/ perihilar edema, improved. Small left pleural effusion. Cardiomegaly.  Left subclavian ICD. IMPRESSION: Cardiomegaly with mild perihilar edema, improved. Small left pleural effusion. Electronically Signed   By: Julian Hy M.D.   On: 10/27/2016 10:31   Dg Chest 2 View  Result Date: 10/26/2016 CLINICAL DATA:  Chest pain EXAM: CHEST  2 VIEW COMPARISON:  03/22/2016 FINDINGS: Biventricular pacer from the left with leads in stable position. Low volumes with chronic mild elevation of the left diaphragm. Mild streaky left infrahilar opacity and lateral left costophrenic sulcus blunting, chronic. There is no edema, consolidation, effusion, or pneumothorax. Chronic cardiomegaly.  IMPRESSION: 1. No acute finding when compared to prior. 2. Chronic mild left base scarring and diaphragm elevation. Electronically Signed   By: Monte Fantasia M.D.   On: 10/26/2016 14:27   Dg Chest Port 1 View  Result Date: 10/26/2016 CLINICAL DATA:  Respiratory distress EXAM: PORTABLE CHEST 1 VIEW COMPARISON:  Chest x-ray from earlier in the same day FINDINGS: Cardiac shadow is enlarged but stable. Defibrillator is again noted and stable. Increasing right basilar infiltrate is seen. Chronic changes are again noted in the left base. IMPRESSION: Increasing right basilar infiltrate when compared with the prior exam. Electronically Signed   By: Inez Catalina M.D.   On: 10/26/2016 21:45    Cardiac Studies   2-D Doppler echocardiogram April 2016: Study Conclusions  - Left ventricle: The cavity size was normal. Wall thickness was increased in a pattern of mild LVH. Systolic function was vigorous. The estimated ejection fraction was in the range of 65% to 70%. - Aortic valve: There was trivial regurgitation. - Left atrium: The atrium was mildly dilated. - Right atrium: The atrium was mildly dilated.  Patient Profile     82 y.o. 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 Pt transferred to [facility] after presenting to OSH emergency department for evaluation of chest pain. Patient has ruled in for a myocardial infarction. His renal function has worsened since admission.  Assessment & Plan    1. CAD/NSTEMI: Patient has ruled in for myocardial infarction. He is pain-free. Continue aspirin, heparin, beta blocker and statin. Await echocardiogram to assess LV function. We discussed the option of cardiac catheterization today. I explained the risks of undiagnosed coronary disease including myocardial infarction  and death. I also explained the risks of catheterization. He has significant baseline renal insufficiency which is worse at present. He would be at significant risk for contrast nephropathy and does not think he would want to consider dialysis. We will not pursue catheterization unless his renal function improves or he would be willing to consider dialysis in the future. Continue to hold Coumadin for now. If we plan medical therapy will treat with Plavix and Coumadin long-term.   2. AKI on CKD stage 3 - creatinine 2.98 this am (baseline 2) Patient has significant worsening of renal function. I agree with nephrology consult. Would be at risk for contrast nephropathy if we pursued catheterization. We will follow his renal function closely and if it does improve he may consider catheterization.  3. HTN: Blood pressure mildly decreased. This may be contributing to his worsening renal function with decreased perfusion. Discontinue amlodipine and decrease carvedilol to 12.5 mg twice a day and follow.  4.  Acute hypoxic respiratory failure Patient appears to be mildly volume overloaded today. I agree with general diuresis and follow renal function.  5. Chronic afib with PPM - HR controlled with underlying V pacing - coumadin on hold - continue IV Heparin gtt - continue BB for rate control  Signed, Kirk Ruths, MD  10/28/2016, 11:16 AM

## 2016-10-28 NOTE — Consult Note (Signed)
Reason for Consult: Acute kidney injury on chronic kidney disease stage 3/4 Referring Physician: Irine Seal M.D. [place])  HPI:  82 year old Caucasian man with past medical history significant for hypertension, peripheral vascular disease status post multiple left lower extremity bypass grafts, history of renal artery stenosis status post stenting in 2009, type 2 diabetes mellitus, congestive heart failure (EF 35%) status post AICD, coronary artery disease and what appears to be chronic kidney disease stage III at baseline with a creatinine roughly around 2.0.  He presented 2 days ago to the emergency room with severe substernal chest pain preceded by left arm pain that were worsened by exertion-consequently found to have non-ST elevation MI. Concern is raised with rising creatinine/decreasing kidney function that poses a barrier to coronary angiogram. He reportedly took a few doses of naproxen in the past week or so leading up to his hospitalization for arm pain. He denies any recent iodinated intravenous contrast exposure or antibiotic use. He denies any nausea or vomiting prior to hospitalization but reports some diarrhea yesterday and the day before (because of Kayexalate). He denies any hematuria or foamy urine. He denies any recent skin rash or new epistaxis/hemoptysis. He denies any prior history of recurrent nephrolithiasis or urinary tract infections.    03/01/2016  06/05/2016  09/13/2016  10/26/2016  10/27/2016  10/28/2016   BUN 45 (H) 61 (H) 34 (H) 55 (H) 60 (H) 82 (H)  Creatinine 1.93 (H) 2.67 (H) 1.96 (H) 2.55 (H) 2.98 (H) 3.78 (H)    Past Medical History:  Diagnosis Date  . Arthritis   . Atrial fibrillation ([place])   . Benign prostatic hypertrophy   . CAD (coronary artery disease)    a. s/p mid LAD stenting with DES 2008 Dr. Daneen Schick  . Chronic systolic dysfunction of left ventricle    a. mixed ischemic and nonischemic CM,  EF 35%. b. s/p AICD implantation.  . Constipation   .  Depression   . ED (erectile dysfunction)   . Gout   . History of MRSA infection   . Hypertension    takes Carvedilol and Losartan daily  . Insomnia   . PAD (peripheral artery disease) (H[facility])    s/p multiple LLE bypass grafts; left mid-distal SCA occlusion by 08/2012 duplex  . Renal artery stenosis ([place])    s/p stenting 2009  . Sleep apnea    hx of "had surgery for"  . Type II diabetes mellitus ([place])     Past Surgical History:  Procedure Laterality Date  . ABDOMINAL ANGIOGRAM  12/25/2011   Procedure: ABDOMINAL ANGIOGRAM;  Surgeon: Serafina Mitchell, MD;  Location: [facility] CATH LAB;  Service: Cardiovascular;;  . ABDOMINAL AORTAGRAM N/A 09/11/2011   Procedure: ABDOMINAL Maxcine Ham;  Surgeon: Wellington Hampshire, MD;  Location: [place] CATH LAB;  Service: Cardiovascular;  Laterality: N/A;  . ADENOIDECTOMY     Hx; of   . AMPUTATION Left 02/04/2013   Procedure: AMPUTATION ABOVE KNEE-LEFT;  Surgeon: Mal Misty, MD;  Location: [place];  Service: Vascular;  Laterality: Left;  . BICEPS TENDON REPAIR Right 2001   Archie Endo 03/20/2001 (07/10/2012)  . BIV ICD GENERTAOR CHANGE OUT N/A 11/09/2013   Procedure: BIV ICD GENERTAOR CHANGE OUT;  Surgeon: Coralyn Mark, MD;  Location: [facility] CATH LAB;  Service: Cardiovascular;  Laterality: N/A;  . CARDIAC DEFIBRILLATOR PLACEMENT  12/26/09   pacemaker combo  . CARPAL TUNNEL RELEASE Right 2002   Archie Endo 03/20/2001 (07/10/2012)  . cervical epidural injection  2013  . COLONOSCOPY  Hx; of  . CORONARY ANGIOPLASTY    . CORONARY ANGIOPLASTY WITH STENT PLACEMENT  ~ 2000  . EMBOLECTOMY  02/07/2012   Procedure: EMBOLECTOMY;  Surgeon: Rosetta Posner, MD;  Location: [place];  Service: Vascular;  Laterality: Left;  . EMBOLECTOMY Left 12/16/2012   Procedure: THROMBECTOMY  LEFT LEG BYPASS;  Surgeon: Elam Dutch, MD;  Location: [place];  Service: Vascular;  Laterality: Left;  . FEMORAL-POPLITEAL BYPASS GRAFT Left 09/16/2012   Procedure: LEFT FEMORAL-POPLITEAL BYPASS GRAFT WITH GORTEX Propaten  Graft 6x80 Thin Wall and Left lower leg Angiogram;  Surgeon: Mal Misty, MD;  Location: [place];  Service: Vascular;  Laterality: Left;  . FEMORAL-TIBIAL BYPASS GRAFT  09/25/2011   Procedure: BYPASS GRAFT FEMORAL-TIBIAL ARTERY;  Surgeon: Mal Misty, MD;  Location: [facility] OR;  Service: Vascular;  Laterality: Left;  Left Femoral - Anterior Tibial Bypass;  saphenous vein graft from left leg  . FEMORAL-TIBIAL BYPASS GRAFT  02/07/2012   Procedure: BYPASS GRAFT FEMORAL-TIBIAL ARTERY;  Surgeon: Rosetta Posner, MD;  Location: [place];  Service: Vascular;  Laterality: Left;  Thrombectomy Left Femoral - Tibial Bypass Graft  . FEMORAL-TIBIAL BYPASS GRAFT  04/03/2012   Procedure: BYPASS GRAFT FEMORAL-TIBIAL ARTERY;  Surgeon: Mal Misty, MD;  Location: [place];  Service: Vascular;  Laterality: Left;  Redo  . FEMORAL-TIBIAL BYPASS GRAFT Left 09/30/2012   Procedure: REDO LEFT FEMORAL-ANTERIOR TIBIAL ARTERY BYPASS USING COMPOSITE CEPHALIC AND BASILIC VEIN GRAFT FROM LEFT ARM;  Surgeon: Mal Misty, MD;  Location: [place];  Service: Vascular;  Laterality: Left;  . FOOT SURGERY Right 03/20/2001   "have plates and screws in; didn't break it" (07/10/2012)  . GROIN DEBRIDEMENT Left 11/12/2012   Procedure: CLOSURE INGUINAL WOUND;  Surgeon: Mal Misty, MD;  Location: [place];  Service: Vascular;  Laterality: Left;  . I&D EXTREMITY Left 10/21/2012   Procedure: EXPLORATION AND DEBRIDEMENT OF LEFT GROIN WOUND;  Surgeon: Mal Misty, MD;  Location: [place];  Service: Vascular;  Laterality: Left;  . INSERT / REPLACE / REMOVE PACEMAKER  2007  . INTRAOPERATIVE ARTERIOGRAM  09/25/2011   Procedure: INTRA OPERATIVE ARTERIOGRAM;  Surgeon: Mal Misty, MD;  Location: [place];  Service: Vascular;  Laterality: Left;  . LEG AMPUTATION ABOVE KNEE Left 02/04/2013   DR LAWSON  . LOWER EXTREMITY ANGIOGRAM Left 12/25/2011   Procedure: LOWER EXTREMITY ANGIOGRAM;  Surgeon: Serafina Mitchell, MD;  Location: [facility] CATH LAB;  Service: Cardiovascular;   Laterality: Left;  lt leg angio co2  . LOWER EXTREMITY ANGIOGRAM N/A 07/07/2012   Procedure: LOWER EXTREMITY ANGIOGRAM;  Surgeon: Conrad White Sands, MD;  Location: [facility] C[facility] LAB;  Service: Cardiovascular;  Laterality: N/A;  . LOWER EXTREMITY ANGIOGRAM N/A 09/15/2012   Procedure: LOWER EXTREMITY ANGIOGRAM;  Surgeon: Serafina Mitchell, MD;  Location: [facility] CATH LAB;  Service: Cardiovascular;  Laterality: N/A;  . PATCH ANGIOPLASTY Left 10/21/2012   Procedure: PATCH ANGIOPLASTY;  Surgeon: Mal Misty, MD;  Location: [place];  Service: Vascular;  Laterality: Left;  . PERIPHERAL VASCULAR CATHETERIZATION N/A 11/08/2015   Procedure: Abdominal Aortogram w/Lower Extremity;  Surgeon: Waynetta Sandy, MD;  Location: [place] CV LAB;  Service: Cardiovascular;  Laterality: N/A;  . REMOVAL OF GRAFT Left 04/16/2013   Procedure: I & D LEFT AKA WOUND, POSSIBLE REMOVAL OF INFECTED GORTEX GRAFT;  Surgeon: Mal Misty, MD;  Location: [place];  Service: Vascular;  Laterality: Left;  . RENAL ARTERY STENT  2009  .  SHOULDER OPEN ROTATOR CUFF REPAIR Right 2001   repair of lacerated right/notes 10/11/1999  (07/10/2012)  . TONSILLECTOMY    . TURP VAPORIZATION    . UPPER EXTREMITY ANGIOGRAM  09/15/2012   Procedure: UPPER EXTREMITY ANGIOGRAM;  Surgeon: Serafina Mitchell, MD;  Location: [facility] CATH LAB;  Service: Cardiovascular;;  . UVULOPALATOPHARYNGOPLASTY (UPPP)/TONSILLECTOMY/SEPTOPLASTY  06/30/2003   Archie Endo 06/30/2003 (07/10/2012)    Family History  Problem Relation Age of Onset  . Cancer Father   . Cancer Unknown        breast/fhx  . Heart disease Unknown        fhx  . Diabetes Neg Hx     Social History:  reports that he has been smoking Cigars.  He has never used smokeless tobacco. He reports that he drinks about 1.8 oz of alcohol per week . He reports that he does not use drugs.  Allergies:  Allergies  Allergen Reactions  . Other Other (See Comments)    Plastic tape tears skin  . Bactrim Ds  [Sulfamethoxazole-Trimethoprim] Other (See Comments)    Hand tremors    Medications:  Scheduled: . aspirin EC  81 mg Oral Daily  . atorvastatin  80 mg Oral q1800  . carvedilol  12.5 mg Oral BID WC  . finasteride  5 mg Oral Daily  . furosemide  40 mg Intravenous Q12H  . gabapentin  600 mg Oral BID  . insulin aspart  0-9 Units Subcutaneous Q4H  . insulin glargine  5 Units Subcutaneous Daily  . levalbuterol  1.25 mg Nebulization TID  . sodium chloride flush  3 mL Intravenous Q12H    BMP Latest Ref Rng & Units 10/28/2016 10/28/2016 10/28/2016  Glucose 65 - 99 mg/dL - - 125(H)  BUN 6 - 20 mg/dL - - 82(H)  Creatinine 0.61 - 1.24 mg/dL - - 3.78(H)  Sodium 135 - 145 mmol/L - - 137  Potassium 3.5 - 5.1 mmol/L 4.7 4.7 4.6  Chloride 101 - 111 mmol/L - - 105  CO2 22 - 32 mmol/L - - 17(L)  Calcium 8.9 - 10.3 mg/dL - - 8.5(L)   CBC Latest Ref Rng & Units 10/28/2016 10/27/2016 10/26/2016  WBC 4.0 - 10.5 K/uL 12.6(H) 16.6(H) 9.4  Hemoglobin 13.0 - 17.0 g/dL 11.3(L) 13.5 13.3  Hematocrit 39.0 - 52.0 % 34.0(L) 41.7 39.8  Platelets 150 - 400 K/uL 125(L) 185 166   Urinalysis    Component Value Date/Time   COLORURINE YELLOW 10/27/2016 [place] 10/27/2016 0648   LABSPEC 1.011 10/27/2016 0648   PHURINE 5.0 10/27/2016 0648   GLUCOSEU NEGATIVE 10/27/2016 0648   HGBUR SMALL (A) 10/27/2016 0648   HGBUR negative 12/29/2008 0818   BILIRUBINUR NEGATIVE 10/27/2016 0648   BILIRUBINUR N 09/13/2016 [place] 10/27/2016 0648   PROTEINUR NEGATIVE 10/27/2016 0648   UROBILINOGEN 0.2 09/13/2016 1023   UROBILINOGEN 0.2 06/15/2014 1608   NITRITE NEGATIVE 10/27/2016 0648   LEUKOCYTESUR NEGATIVE 10/27/2016 0648      Dg Chest 2 View  Result Date: 10/27/2016 CLINICAL DATA:  Shortness of breath EXAM: CHEST  2 VIEW COMPARISON:  10/26/2016 FINDINGS: Mild right upper lobe/ perihilar edema, improved. Small left pleural effusion. Cardiomegaly.  Left subclavian ICD. IMPRESSION:  Cardiomegaly with mild perihilar edema, improved. Small left pleural effusion. Electronically Signed   By: Julian Hy M.D.   On: 10/27/2016 10:31   Dg Chest 2 View  Result Date: 10/26/2016 CLINICAL DATA:  Chest pain EXAM: CHEST  2 VIEW COMPARISON:  03/22/2016 FINDINGS: Biventricular pacer from the left with leads in stable position. Low volumes with chronic mild elevation of the left diaphragm. Mild streaky left infrahilar opacity and lateral left costophrenic sulcus blunting, chronic. There is no edema, consolidation, effusion, or pneumothorax. Chronic cardiomegaly. IMPRESSION: 1. No acute finding when compared to prior. 2. Chronic mild left base scarring and diaphragm elevation. Electronically Signed   By: Monte Fantasia M.D.   On: 10/26/2016 14:27   Dg Chest Port 1 View  Result Date: 10/28/2016 CLINICAL DATA:  Shortness of breath. History of CHF, coronary artery disease, diabetes, current smoker. EXAM: PORTABLE CHEST 1 VIEW COMPARISON:  PA and lateral chest x-ray of October 27, 2016 and October 26, 2016. FINDINGS: The lungs are reasonably well inflated. The cardiac silhouette remains enlarged. The pulmonary vascularity is prominent centrally but is less engorged today. There is calcification in the wall of the aortic arch. There is a small left pleural effusion. The ICD is in stable position. The bony thorax exhibits no acute abnormality. IMPRESSION: CHF with mild pulmonary vascular congestion which has improved since the 26 October 2016 study. There remain coarse infrahilar lung markings on the right which may reflect atelectasis or pneumonia. Continued follow-up radiographs are recommended. Thoracic aortic atherosclerosis. Electronically Signed   By: David  Martinique M.D.   On: 10/28/2016 13:14   Dg Chest Port 1 View  Result Date: 10/26/2016 CLINICAL DATA:  Respiratory distress EXAM: PORTABLE CHEST 1 VIEW COMPARISON:  Chest x-ray from earlier in the same day FINDINGS: Cardiac shadow is enlarged  but stable. Defibrillator is again noted and stable. Increasing right basilar infiltrate is seen. Chronic changes are again noted in the left base. IMPRESSION: Increasing right basilar infiltrate when compared with the prior exam. Electronically Signed   By: Inez Catalina M.D.   On: 10/26/2016 21:45    Review of Systems  Constitutional: Positive for malaise/fatigue. Negative for chills and fever.  HENT: Negative.   Eyes: Negative.   Respiratory: Positive for shortness of breath. Negative for cough and sputum production.   Cardiovascular: Positive for chest pain and PND. Negative for palpitations and orthopnea.  Gastrointestinal: Positive for diarrhea. Negative for nausea and vomiting.  Genitourinary: Negative.   Musculoskeletal: Positive for joint pain.       Right knee and hip on and off  Skin: Negative.   Neurological: Negative for focal weakness and headaches.   Blood pressure 108/61, pulse 70, temperature 98.3 F (36.8 C), temperature source Axillary, resp. rate (!) 29, height 5' 7.99" (1.727 m), weight 80.1 kg (176 lb 8 oz), SpO2 96 %. Physical Exam  Nursing note and vitals reviewed. Constitutional: He is oriented to person, place, and time. He appears well-developed and well-nourished.  HENT:  Head: Normocephalic and atraumatic.  Mouth/Throat: Oropharynx is clear and moist.  Eyes: Pupils are equal, round, and reactive to light. EOM are normal. No scleral icterus.  Neck: Normal range of motion. Neck supple. No JVD present.  Cardiovascular: Normal rate, regular rhythm and normal heart sounds.   Respiratory: Effort normal. He has no wheezes. He has rales.  GI: Soft. Bowel sounds are normal. There is no tenderness. There is no rebound and no guarding.  Musculoskeletal:  Trace edema right ankle. Left leg status post AKA  Neurological: He is alert and oriented to person, place, and time.  Skin: Skin is warm and dry.  Psychiatric: He has a normal mood and affect.     Assessment/Plan: 1. Acute kidney injury on chronic kidney disease stage  III/IV: Suspect multifactorial underlying chronic kidney disease center to which is likely ischemic nephropathy given his history of RAS status post stenting almost 10 years ago. His current acute renal injury appears to be hemodynamically mediated likely from recent non-ST elevation MI/changes in cardiac output/renal perfusion. It is indeed possible that recent use of NSAIDs may have exacerbated the situation. Urine sediment does not show clear evidence of GN and I will check for hypocomplementemic AKA as well as ANCA vasculitis. Agree with renal ultrasound and noted to evaluate renal size and structure. Urine electrolytes evaluated-both fractional excretion of urea and fractional excretion of sodium pointing to prerenal type mechanism with renal hypoperfusion. The plan at this time a week to try and optimize diuretic therapy for cardiac unloading/augmenting his urine output to try and see if we can improve clearance. At this time, agree with avoiding elective coronary angiogram. 2. Non-ST elevation MI: With history of coronary artery disease/PAD, plans noted by cardiology for optimization of medical management as he gets into position for coronary angiogram based on renal recovery. 3. Hypertension: Blood Pressure currently in acceptable range, continue to monitor closely for adjustment of antihypertensive therapy. 4. Hyperkalemia: Likely secondary to acute kidney injury, corrected with Kayexalate.   Jonathan Castro K. 10/28/2016, 1:56 PM

## 2016-10-28 NOTE — Progress Notes (Signed)
  Echocardiogram 2D Echocardiogram has been performed.  Johny Chess 10/28/2016, 12:27 PM

## 2016-10-28 NOTE — Progress Notes (Signed)
ANTICOAGULATION CONSULT NOTE - Follow Up Consult  Pharmacy Consult for heparin Indication: NSTEMI and afib  Allergies  Allergen Reactions  . Other Other (See Comments)    Plastic tape tears skin  . Bactrim Ds [Sulfamethoxazole-Trimethoprim] Other (See Comments)    Hand tremors    Patient Measurements: Height: 5' 7.99" (172.7 cm) Weight: 176 lb 8 oz (80.1 kg) IBW/kg (Calculated) : 68.38 Heparin Dosing Weight:   Vital Signs: Temp: 97.9 F (36.6 C) (08/27 1932) Temp Source: Axillary (08/27 1932) BP: 91/55 (08/27 2115) Pulse Rate: 64 (08/27 1932)  Labs:  Recent Labs  10/26/16 1322  10/26/16 1400 10/26/16 2230 10/27/16 0605 10/27/16 0606 10/27/16 0607 10/27/16 0808  10/28/16 0704 10/28/16 1220 10/28/16 2042  HGB 13.3  --   --   --   --  13.5  --   --   --  11.3*  --   --   HCT 39.8  --   --   --   --  41.7  --   --   --  34.0*  --   --   PLT 166  --   --   --   --  185  --   --   --  125*  --   --   LABPROT  --   --  19.9*  --  22.0*  --   --   --   --   --   --   --   INR  --   --  1.67  --  1.90  --   --   --   --   --   --   --   HEPARINUNFRC  --   --   --   --  0.94*  --   --   --   < > <0.10* 0.14* 0.45  CREATININE  --   --  2.55*  --   --  2.98*  --   --   --  3.78*  --   --   TROPONINI  --   < > 4.79* 2.62*  --   --  39.06* 46.73*  --   --   --   --   < > = values in this interval not displayed.  Estimated Creatinine Clearance: 15.1 mL/min (A) (by C-G formula based on SCr of 3.78 mg/dL (H)).   Medications:  Scheduled:  . aspirin EC  81 mg Oral Daily  . atorvastatin  80 mg Oral q1800  . carvedilol  12.5 mg Oral BID WC  . finasteride  5 mg Oral Daily  . [START ON 10/29/2016] furosemide  40 mg Intravenous Q12H  . gabapentin  600 mg Oral BID  . insulin aspart  0-9 Units Subcutaneous Q4H  . insulin glargine  5 Units Subcutaneous Daily  . levalbuterol  1.25 mg Nebulization TID  . sodium chloride flush  3 mL Intravenous Q12H   Infusions:  . heparin 1,100  Units/hr (10/28/16 1743)    Assessment: 82 yo male with afib and NSTEMi is currently on therapeutic heparin.  Heparin level is 0.45.  Goal of Therapy:  Heparin level 0.3-0.7 units/ml Monitor platelets by anticoagulation protocol: Yes   Plan:  - continue heparin at 1100 units/hr   Loa Idler, Tsz-Yin 10/28/2016,9:51 PM

## 2016-10-28 NOTE — Progress Notes (Signed)
ANTICOAGULATION CONSULT NOTE - Follow Up Consult  Pharmacy Consult for heparin Indication: NSTEMI and Afib  Labs:  Recent Labs  10/26/16 1322  10/26/16 1400 10/26/16 2230 10/27/16 0605 10/27/16 0606 10/27/16 0607 10/27/16 0808 10/27/16 1420 10/27/16 2319  HGB 13.3  --   --   --   --  13.5  --   --   --   --   HCT 39.8  --   --   --   --  41.7  --   --   --   --   PLT 166  --   --   --   --  185  --   --   --   --   LABPROT  --   --  19.9*  --  22.0*  --   --   --   --   --   INR  --   --  1.67  --  1.90  --   --   --   --   --   HEPARINUNFRC  --   --   --   --  0.94*  --   --   --  0.51 0.40  CREATININE  --   --  2.55*  --   --  2.98*  --   --   --   --   TROPONINI  --   < > 4.79* 2.62*  --   --  39.06* 46.73*  --   --   < > = values in this interval not displayed.  Assessment: 82yo male with h/o Afib, ischemic heart disease with prior stenting, and AICD presents with CP and possible NSTEMI.  PTA coumadin on hold.   Heparin level is therapeutic at 0.40. No s/s bleeding per notes.   Goal of Therapy:  Heparin level 0.3-0.7 units/ml   Plan:  Continue heparin gtt at 900 units/hr Heparin level and CBC daily Monitor for s/s bleeding  Argie Ramming, PharmD Clinical Pharmacist 10/28/16 12:22 AM

## 2016-10-28 NOTE — Progress Notes (Addendum)
ANTICOAGULATION CONSULT NOTE - Follow Up Consult  Pharmacy Consult for heparin Indication: NSTEMI and Afib  Labs:  Recent Labs  10/26/16 1322  10/26/16 1400 10/26/16 2230 10/27/16 0605 10/27/16 0606 10/27/16 0607 10/27/16 0808  10/27/16 2319 10/28/16 0704 10/28/16 1220  HGB 13.3  --   --   --   --  13.5  --   --   --   --  11.3*  --   HCT 39.8  --   --   --   --  41.7  --   --   --   --  34.0*  --   PLT 166  --   --   --   --  185  --   --   --   --  125*  --   LABPROT  --   --  19.9*  --  22.0*  --   --   --   --   --   --   --   INR  --   --  1.67  --  1.90  --   --   --   --   --   --   --   HEPARINUNFRC  --   --   --   --  0.94*  --   --   --   < > 0.40 <0.10* 0.14*  CREATININE  --   --  2.55*  --   --  2.98*  --   --   --   --  3.78*  --   TROPONINI  --   < > 4.79* 2.62*  --   --  39.06* 46.73*  --   --   --   --   < > = values in this interval not displayed.  Assessment: 82yo male with h/o Afib, ischemic heart disease with prior stenting, and AICD presents with CP and possible NSTEMI.  PTA coumadin on hold.   Heparin level was therapeutic last evening on 900 units/hr; however, came back undetectable this morning. H/H and platelets decreased today. Confirmed with nursing no signs or symptoms of bleeding. There was no adjustment in the rate or interruptions to the heparin infusion. Repeat heparin level came back subtherapeutic at 0.14.   Goal of Therapy:  Heparin level 0.3-0.7 units/ml   Plan:  Increase heparin gtt at 1100 units/hr Obtain heparin level 8 hours after dose adjustment Heparin level and CBC daily Monitor for s/s bleeding  Doylene Canard, PharmD Clinical Pharmacist  Pager: [phone] 10/28/16 1:22 PM

## 2016-10-28 NOTE — Progress Notes (Signed)
Spoke with Dr. Grandville Silos concerning order for foley.  Foley indication is acute renal failure to verify questionable post renal disease.  Renal US pending.  Nephrology following.  Primary RN updated.  [name]

## 2016-10-29 ENCOUNTER — Inpatient Hospital Stay (HOSPITAL_COMMUNITY): Payer: PPO

## 2016-10-29 DIAGNOSIS — I272 Pulmonary hypertension, unspecified: Secondary | ICD-10-CM

## 2016-10-29 DIAGNOSIS — I5032 Chronic diastolic (congestive) heart failure: Secondary | ICD-10-CM

## 2016-10-29 LAB — CBC
HCT: 33.5 % — ABNORMAL LOW (ref 39.0–52.0)
Hemoglobin: 11.4 g/dL — ABNORMAL LOW (ref 13.0–17.0)
MCH: 28.1 pg (ref 26.0–34.0)
MCHC: 34 g/dL (ref 30.0–36.0)
MCV: 82.7 fL (ref 78.0–100.0)
PLATELETS: 124 10*3/uL — AB (ref 150–400)
RBC: 4.05 MIL/uL — AB (ref 4.22–5.81)
RDW: 16.1 % — AB (ref 11.5–15.5)
WBC: 12.7 10*3/uL — AB (ref 4.0–10.5)

## 2016-10-29 LAB — GLUCOSE, CAPILLARY
GLUCOSE-CAPILLARY: 136 mg/dL — AB (ref 65–99)
Glucose-Capillary: 114 mg/dL — ABNORMAL HIGH (ref 65–99)
Glucose-Capillary: 118 mg/dL — ABNORMAL HIGH (ref 65–99)
Glucose-Capillary: 186 mg/dL — ABNORMAL HIGH (ref 65–99)
Glucose-Capillary: 210 mg/dL — ABNORMAL HIGH (ref 65–99)

## 2016-10-29 LAB — URINALYSIS, ROUTINE W REFLEX MICROSCOPIC
Bilirubin Urine: NEGATIVE
Glucose, UA: NEGATIVE mg/dL
Hgb urine dipstick: NEGATIVE
Ketones, ur: NEGATIVE mg/dL
Nitrite: NEGATIVE
Protein, ur: NEGATIVE mg/dL
Specific Gravity, Urine: 1.014 (ref 1.005–1.030)
pH: 5 (ref 5.0–8.0)

## 2016-10-29 LAB — BASIC METABOLIC PANEL
ANION GAP: 13 (ref 5–15)
BUN: 98 mg/dL — ABNORMAL HIGH (ref 6–20)
CALCIUM: 8.4 mg/dL — AB (ref 8.9–10.3)
CO2: 18 mmol/L — ABNORMAL LOW (ref 22–32)
Chloride: 106 mmol/L (ref 101–111)
Creatinine, Ser: 4.36 mg/dL — ABNORMAL HIGH (ref 0.61–1.24)
GFR, EST AFRICAN AMERICAN: 13 mL/min — AB (ref >60)
GFR, EST NON AFRICAN AMERICAN: 12 mL/min — AB (ref >60)
Glucose, Bld: 105 mg/dL — ABNORMAL HIGH (ref 65–99)
Potassium: 4.3 mmol/L (ref 3.5–5.1)
SODIUM: 137 mmol/L (ref 135–145)

## 2016-10-29 LAB — POTASSIUM
POTASSIUM: 4.5 mmol/L (ref 3.5–5.1)
POTASSIUM: 5.1 mmol/L (ref 3.5–5.1)
Potassium: 4.6 mmol/L (ref 3.5–5.1)

## 2016-10-29 LAB — CREATININE, URINE, RANDOM: CREATININE, URINE: 242.59 mg/dL

## 2016-10-29 LAB — SODIUM, URINE, RANDOM: Sodium, Ur: 10 mmol/L

## 2016-10-29 LAB — MAGNESIUM: MAGNESIUM: 2.4 mg/dL (ref 1.7–2.4)

## 2016-10-29 LAB — HEPARIN LEVEL (UNFRACTIONATED): HEPARIN UNFRACTIONATED: 0.5 [IU]/mL (ref 0.30–0.70)

## 2016-10-29 MED ORDER — ENOXAPARIN SODIUM 80 MG/0.8ML ~~LOC~~ SOLN
1.0000 mg/kg | SUBCUTANEOUS | Status: DC
  Administered 2016-10-29: 80 mg via SUBCUTANEOUS
  Filled 2016-10-29: qty 0.8

## 2016-10-29 MED ORDER — ALBUMIN HUMAN 25 % IV SOLN
50.0000 g | Freq: Once | INTRAVENOUS | Status: AC
  Administered 2016-10-29: 50 g via INTRAVENOUS
  Filled 2016-10-29: qty 50

## 2016-10-29 MED ORDER — INSULIN GLARGINE 100 UNIT/ML ~~LOC~~ SOLN
8.0000 [IU] | Freq: Every day | SUBCUTANEOUS | Status: DC
  Administered 2016-10-30 – 2016-11-02 (×4): 8 [IU] via SUBCUTANEOUS
  Filled 2016-10-29 (×4): qty 0.08

## 2016-10-29 MED ORDER — FUROSEMIDE 10 MG/ML IJ SOLN: 20.0000 mg | Freq: Two times a day (BID) | INTRAMUSCULAR | Status: DC | PRN

## 2016-10-29 MED ORDER — GABAPENTIN 300 MG PO CAPS
300.0000 mg | ORAL_CAPSULE | Freq: Every day | ORAL | Status: DC
  Administered 2016-10-30 – 2016-10-31 (×2): 300 mg via ORAL
  Filled 2016-10-29 (×2): qty 1

## 2016-10-29 MED ORDER — INSULIN ASPART 100 UNIT/ML ~~LOC~~ SOLN
0.0000 [IU] | SUBCUTANEOUS | Status: DC
  Administered 2016-10-29: 3 [IU] via SUBCUTANEOUS
  Administered 2016-10-30 (×2): 2 [IU] via SUBCUTANEOUS
  Administered 2016-10-30: 3 [IU] via SUBCUTANEOUS
  Administered 2016-10-30 – 2016-10-31 (×2): 2 [IU] via SUBCUTANEOUS

## 2016-10-29 MED ORDER — SODIUM CHLORIDE 0.9 % IV BOLUS (SEPSIS)
500.0000 mL | Freq: Once | INTRAVENOUS | Status: AC
  Administered 2016-10-29: 500 mL via INTRAVENOUS

## 2016-10-29 MED ORDER — SODIUM POLYSTYRENE SULFONATE 15 GM/60ML PO SUSP
30.0000 g | Freq: Once | ORAL | Status: AC
  Administered 2016-10-29: 30 g via ORAL
  Filled 2016-10-29: qty 120

## 2016-10-29 MED ORDER — IPRATROPIUM-ALBUTEROL 0.5-2.5 (3) MG/3ML IN SOLN
3.0000 mL | RESPIRATORY_TRACT | Status: DC | PRN
  Administered 2016-10-31: 3 mL via RESPIRATORY_TRACT
  Filled 2016-10-29 (×2): qty 3

## 2016-10-29 NOTE — Progress Notes (Signed)
Progress Note  Patient Name: [name] Date of Encounter: 10/29/2016  Primary Cardiologist: Dr. Daneen Schick  Subjective   Some dyspnea this AM; no chest pain  Inpatient Medications    Scheduled Meds: . aspirin EC  81 mg Oral Daily  . atorvastatin  80 mg Oral q1800  . carvedilol  12.5 mg Oral BID WC  . finasteride  5 mg Oral Daily  . furosemide  40 mg Intravenous Q12H  . gabapentin  600 mg Oral BID  . insulin aspart  0-9 Units Subcutaneous Q4H  . insulin glargine  5 Units Subcutaneous Daily  . levalbuterol  1.25 mg Nebulization TID  . sodium chloride flush  3 mL Intravenous Q12H   Continuous Infusions: . heparin 1,100 Units/hr (10/28/16 1743)   PRN Meds: acetaminophen, morphine injection, nitroGLYCERIN, ondansetron (ZOFRAN) IV, oxyCODONE-acetaminophen **AND** oxyCODONE   Vital Signs    Vitals:   10/29/16 0341 10/29/16 0514 10/29/16 0603 10/29/16 0746  BP:  (!) 93/51  (!) 107/56  Pulse: 77   62  Resp:  20    Temp:   98.6 F (37 C) 98.9 F (37.2 C)  TempSrc:   Oral Oral  SpO2: 100% 95%  94%  Weight:   79.2 kg (174 lb 9.6 oz)   Height:        Intake/Output Summary (Last 24 hours) at 10/29/16 0859 Last data filed at 10/29/16 0703  Gross per 24 hour  Intake           276.93 ml  Output              250 ml  Net            26.93 ml   Filed Weights   10/27/16 0447 10/28/16 0532 10/29/16 0603  Weight: 77.3 kg (170 lb 8 oz) 80.1 kg (176 lb 8 oz) 79.2 kg (174 lb 9.6 oz)    Telemetry    V pacing with occasional PVC- Personally Reviewed   Physical Exam   GEN: WD/WN, No acute distress, A and oriented x 3  Neck: +JVD Cardiac: RRR, 2/6 systolic murmur apex Respiratory: mild basilar crackles; no wheeze GI: Soft, nontender, non-distended, no masses palpated MS: no edema Neuro:  grossly intact Psych: Normal affect   Labs    Chemistry Recent Labs Lab 10/27/16 0606  10/28/16 0704  10/28/16 2042 10/29/16 0128 10/29/16 0729  NA 136  --  137  --    --   --  137  K 6.7*  < > 4.6  4.6  < > 4.7 4.6 4.3  CL 106  --  105  --   --   --  106  CO2 16*  --  17*  --   --   --  18*  GLUCOSE 137*  --  125*  --   --   --  105*  BUN 60*  --  82*  --   --   --  98*  CREATININE 2.98*  --  3.78*  --   --   --  4.36*  CALCIUM 9.0  --  8.5*  --   --   --  8.4*  PROT  --   --  5.8*  --   --   --   --   ALBUMIN  --   --  3.3*  --   --   --   --   AST  --   --  118*  --   --   --   --  ALT  --   --  33  --   --   --   --   ALKPHOS  --   --  45  --   --   --   --   BILITOT  --   --  2.4*  --   --   --   --   GFRNONAA 18*  --  14*  --   --   --  12*  GFRAA 21*  --  16*  --   --   --  13*  ANIONGAP 14  --  15  --   --   --  13  < > = values in this interval not displayed.   Hematology  Recent Labs Lab 10/27/16 0606 10/28/16 0704 10/29/16 0729  WBC 16.6* 12.6* 12.7*  RBC 4.95 4.06* 4.05*  HGB 13.5 11.3* 11.4*  HCT 41.7 34.0* 33.5*  MCV 84.2 83.7 82.7  MCH 27.3 27.8 28.1  MCHC 32.4 33.2 34.0  RDW 15.8* 16.0* 16.1*  PLT 185 125* 124*    Cardiac Enzymes  Recent Labs Lab 10/26/16 1400 10/26/16 2230 10/27/16 0607 10/27/16 0808  TROPONINI 4.79* 2.62* 39.06* 46.73*    BNP  Recent Labs Lab 10/27/16 1413  BNP 1,249.1*     Radiology    Dg Chest 2 View  Result Date: 10/27/2016 CLINICAL DATA:  Shortness of breath EXAM: CHEST  2 VIEW COMPARISON:  10/26/2016 FINDINGS: Mild right upper lobe/ perihilar edema, improved. Small left pleural effusion. Cardiomegaly.  Left subclavian ICD. IMPRESSION: Cardiomegaly with mild perihilar edema, improved. Small left pleural effusion. Electronically Signed   By: Julian Hy M.D.   On: 10/27/2016 10:31   US Renal  Result Date: 10/28/2016 CLINICAL DATA:  82 year old male with acute renal failure. EXAM: RENAL / URINARY TRACT ULTRASOUND COMPLETE COMPARISON:  06/05/2016 and prior CTs FINDINGS: Right Kidney: Length: 7.6 cm with moderate cortical thinning. Echogenicity is increased. There is no  evidence of solid mass or hydronephrosis. Multiple cysts are present. Left Kidney: Length: 12.6 cm. Echogenicity is upper limits of normal. No evidence of solid mass or hydronephrosis. Multiple cysts are present. Bladder: Appears normal for degree of bladder distention. IMPRESSION: 1. Small atrophic right kidney. 2. Upper limits of normal left renal echogenicity which may represent medical renal disease. 3. No evidence of hydronephrosis. Electronically Signed   By: Margarette Canada M.D.   On: 10/28/2016 20:32   Dg Chest Port 1 View  Result Date: 10/28/2016 CLINICAL DATA:  Shortness of breath. History of CHF, coronary artery disease, diabetes, current smoker. EXAM: PORTABLE CHEST 1 VIEW COMPARISON:  PA and lateral chest x-ray of October 27, 2016 and October 26, 2016. FINDINGS: The lungs are reasonably well inflated. The cardiac silhouette remains enlarged. The pulmonary vascularity is prominent centrally but is less engorged today. There is calcification in the wall of the aortic arch. There is a small left pleural effusion. The ICD is in stable position. The bony thorax exhibits no acute abnormality. IMPRESSION: CHF with mild pulmonary vascular congestion which has improved since the 26 October 2016 study. There remain coarse infrahilar lung markings on the right which may reflect atelectasis or pneumonia. Continued follow-up radiographs are recommended. Thoracic aortic atherosclerosis. Electronically Signed   By: David  Martinique M.D.   On: 10/28/2016 13:14    Cardiac Studies   2-D Doppler echocardiogram April 2016: Study Conclusions  - Left ventricle: The cavity size was normal. Wall thickness was increased in a pattern of mild LVH.  Systolic function was vigorous. The estimated ejection fraction was in the range of 65% to 70%. - Aortic valve: There was trivial regurgitation. - Left atrium: The atrium was mildly dilated. - Right atrium: The atrium was mildly dilated.  Patient Profile     82 y.o.  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 Pt transferred to [facility] after presenting to OSH emergency department for evaluation of chest pain. Patient has ruled in for a myocardial infarction. His renal function has worsened since admission.   Assessment & Plan    1. CAD/NSTEMI: Patient remains pain-free. Continue aspirin, heparin, and statin. BP low last PM and decreased perfusion likely contributing to acute on chronic renal failure; DC coreg and follow BP. We discussed the option of cardiac catheterization again today. I explained that catheterization at this point would likely result in dialysis. He states he will not consider dialysis under any circumstances. We will therefore not pursue catheterization unless his kidney function improves or he changes his mind concerning dialysis. He understands the risk of undiagnosed coronary disease including myocardial infarction and death. We'll continue to hold Coumadin.  2. AKI on CKD stage 3 - creatinine 2.98 this am (baseline 2) Renal function continues to deteriorate. Discontinue carvedilol. This will allow blood pressure to run higher and hopefully improve renal perfusion. Nephrology following. Would be at significant risk for contrast nephropathy if catheterization pursued.   3. HTN: Blood pressure decreased last PM. This may be contributing to his worsening renal function with decreased perfusion. DC coreg and follow.  4.  Acute hypoxic respiratory failure Patient remains volume overloaded. Continue Lasix at present dose.   5. Chronic afib with PPM - HR controlled with underlying V pacing - coumadin on hold - continue IV Heparin gtt   Signed, Kirk Ruths, MD  10/29/2016, 8:59 AM

## 2016-10-29 NOTE — Progress Notes (Signed)
Inpatient Diabetes Program Recommendations  AACE/ADA: New Consensus Statement on Inpatient Glycemic Control (2015)  Target Ranges:  Prepandial:   less than 140 mg/dL      Peak postprandial:   less than 180 mg/dL (1-2 hours)      Critically ill patients:  140 - 180 mg/dL   Results for [name], [name] (MRN [id number]) as of 10/29/2016 10:11  Ref. Range 10/28/2016 07:27 10/28/2016 11:43 10/28/2016 16:24 10/28/2016 21:28 10/29/2016 00:10 10/29/2016 05:10 10/29/2016 07:45  Glucose-Capillary Latest Ref Range: 65 - 99 mg/dL 135 (H) 219 (H) 201 (H) 222 (H) 136 (H) 114 (H) 118 (H)   Review of Glycemic Control  Diabetes history: DM2 Outpatient Diabetes medications: 70/30 5 units BID Current orders for Inpatient glycemic control: Lantus 5 units daily, Novolog 0-9 units Q4H  Inpatient Diabetes Program Recommendations: Insulin - Meal Coverage: Post prandial glucose is consistently elevated. Please consider ordering Novolog 2 units TID with meals for meal coverage if patient eats at least 50% of meals. Insulin-Correction: MD may want to consider changing frequency of CBGs and Novolog to ACHS if appropriate.  Thanks, Barnie Alderman, RN, MSN, CDE Diabetes Coordinator Inpatient Diabetes Program [phone] (Team Pager from 8am to 5pm)

## 2016-10-29 NOTE — Progress Notes (Signed)
ANTICOAGULATION CONSULT NOTE - Follow Up Consult  Pharmacy Consult for heparin Indication: NSTEMI and Afib  Labs:  Recent Labs  10/26/16 1400 10/26/16 2230 10/27/16 0605 10/27/16 0606 10/27/16 0607 10/27/16 0808  10/28/16 0704 10/28/16 1220 10/28/16 2042 10/29/16 0729  HGB  --   --   --  13.5  --   --   --  11.3*  --   --  11.4*  HCT  --   --   --  41.7  --   --   --  34.0*  --   --  33.5*  PLT  --   --   --  185  --   --   --  125*  --   --  124*  LABPROT 19.9*  --  22.0*  --   --   --   --   --   --   --   --   INR 1.67  --  1.90  --   --   --   --   --   --   --   --   HEPARINUNFRC  --   --  0.94*  --   --   --   < > <0.10* 0.14* 0.45 0.50  CREATININE 2.55*  --   --  2.98*  --   --   --  3.78*  --   --  4.36*  TROPONINI 4.79* 2.62*  --   --  39.06* 46.73*  --   --   --   --   --   < > = values in this interval not displayed.  Assessment: 82 yo male with h/o Afib, ischemic heart disease with prior stenting, and AICD presents with CP and NSTEMI.  PTA coumadin on hold.   Heparin level therapeutic at 0.50.  No bleeding noted. CBC stable.   Goal of Therapy:  Heparin level 0.3-0.7 units/ml  Monitor platelets by anticoagulation protocol: Yes   Plan:  Continue heparin drip at 1,100 units/hr Daily Heparin level and CBC  Monitor for s/sx bleeding    Diana L. Kyung Rudd, PharmD, [place] PGY1 Pharmacy Resident Pager: [phone]

## 2016-10-29 NOTE — Progress Notes (Signed)
Patient ID: [name], male   DOB: 16-Aug-1934, 82 y.o.   MRN: [id number]  [facility] Progress Note   Assessment/ Plan:   1. Acute kidney injury on chronic kidney disease stage III/IV: Suspect multifactorial underlying chronic kidney disease center to which is likely ischemic nephropathy given his history of RAS status post stenting almost 10 years ago. His acute renal injury is likely hemodynamically mediated following NSTEMI with renal hypoperfusion based on urine electrolytes. Undertook diuresis yesterday for efforts at volume unloading/augmenting urine output. Renal function worse today and I agree with plans to decrease carvedilol. Currently he is without indications for dialysis -we discussed both long-term and short-term dialysis and he is willing to undertake temporary/short-term treatment only. 2. Non-ST elevation MI: With history of coronary artery disease/PAD, Ongoing optimize medical management by cardiology as he awaits for coronary angiogram after satisfactory renal recovery. 3. Hypertension: Blood pressures mainly on the lower side-agree with plans by cardiology to discontinue carvedilol at this time to help improve renal perfusion.  Subjective:   Reports some shortness of breath without chest pain. Had an episode of what appears to be postural dizziness/lightheadedness earlier today.    Objective:   BP (!) 107/56 (BP Location: Right Arm)   Pulse 62   Temp 98.9 F (37.2 C) (Oral)   Resp 20   Ht 5' 7.99" (1.727 m)   Wt 79.2 kg (174 lb 9.6 oz)   SpO2 95%   BMI 26.55 kg/m   Intake/Output Summary (Last 24 hours) at 10/29/16 1031 Last data filed at 10/29/16 0703  Gross per 24 hour  Intake           276.93 ml  Output              150 ml  Net           126.93 ml   Weight change: -0.862 kg (-1 lb 14.4 oz)  Physical Exam: DGU:[id number] comfortably in recliner, no obvious distress QVZ:DGLOV regular rhythm, normal rate, 2/6 holosystolic murmur over  apex Resp: Fine rales left base, no rhonchi/wheeze Abd: Soft, obese, nontender Ext: Status post left above-knee amputation, right leg  Imaging: US Renal  Result Date: 10/28/2016 CLINICAL DATA:  82 year old male with acute renal failure. EXAM: RENAL / URINARY TRACT ULTRASOUND COMPLETE COMPARISON:  06/05/2016 and prior CTs FINDINGS: Right Kidney: Length: 7.6 cm with moderate cortical thinning. Echogenicity is increased. There is no evidence of solid mass or hydronephrosis. Multiple cysts are present. Left Kidney: Length: 12.6 cm. Echogenicity is upper limits of normal. No evidence of solid mass or hydronephrosis. Multiple cysts are present. Bladder: Appears normal for degree of bladder distention. IMPRESSION: 1. Small atrophic right kidney. 2. Upper limits of normal left renal echogenicity which may represent medical renal disease. 3. No evidence of hydronephrosis. Electronically Signed   By: Margarette Canada M.D.   On: 10/28/2016 20:32   Dg Chest Port 1 View  Result Date: 10/28/2016 CLINICAL DATA:  Shortness of breath. History of CHF, coronary artery disease, diabetes, current smoker. EXAM: PORTABLE CHEST 1 VIEW COMPARISON:  PA and lateral chest x-ray of October 27, 2016 and October 26, 2016. FINDINGS: The lungs are reasonably well inflated. The cardiac silhouette remains enlarged. The pulmonary vascularity is prominent centrally but is less engorged today. There is calcification in the wall of the aortic arch. There is a small left pleural effusion. The ICD is in stable position. The bony thorax exhibits no acute abnormality. IMPRESSION: CHF with mild pulmonary vascular congestion  which has improved since the 26 October 2016 study. There remain coarse infrahilar lung markings on the right which may reflect atelectasis or pneumonia. Continued follow-up radiographs are recommended. Thoracic aortic atherosclerosis. Electronically Signed   By: David  Martinique M.D.   On: 10/28/2016 13:14    Labs: BMET  Recent  Labs Lab 10/26/16 1400  10/27/16 0606  10/28/16 0704 10/28/16 1018 10/28/16 1220 10/28/16 1424 10/28/16 1630 10/28/16 2042 10/29/16 0128 10/29/16 0729  NA 137  --  136  --  137  --   --   --   --   --   --  137  K 5.3*  < > 6.7*  < > 4.6  4.6 4.7 4.7 4.6 4.6 4.7 4.6 4.3  CL 108  --  106  --  105  --   --   --   --   --   --  106  CO2 20*  --  16*  --  17*  --   --   --   --   --   --  18*  GLUCOSE 127*  --  137*  --  125*  --   --   --   --   --   --  105*  BUN 55*  --  60*  --  82*  --   --   --   --   --   --  98*  CREATININE 2.55*  --  2.98*  --  3.78*  --   --   --   --   --   --  4.36*  CALCIUM 8.8*  --  9.0  --  8.5*  --   --   --   --   --   --  8.4*  < > = values in this interval not displayed. CBC  Recent Labs Lab 10/26/16 1322 10/27/16 0606 10/28/16 0704 10/29/16 0729  WBC 9.4 16.6* 12.6* 12.7*  HGB 13.3 13.5 11.3* 11.4*  HCT 39.8 41.7 34.0* 33.5*  MCV 82.7 84.2 83.7 82.7  PLT 166 185 125* 124*    Medications:    . aspirin EC  81 mg Oral Daily  . atorvastatin  80 mg Oral q1800  . finasteride  5 mg Oral Daily  . furosemide  40 mg Intravenous Q12H  . gabapentin  600 mg Oral BID  . insulin aspart  0-9 Units Subcutaneous Q4H  . insulin glargine  5 Units Subcutaneous Daily  . levalbuterol  1.25 mg Nebulization TID  . sodium chloride flush  3 mL Intravenous Q12H      Elmarie Shiley, MD 10/29/2016, 10:31 AM

## 2016-10-29 NOTE — Progress Notes (Signed)
ANTICOAGULATION CONSULT NOTE - Follow Up Consult  Pharmacy Consult for lovenox Indication: chest pain/ACS  Allergies  Allergen Reactions  . Other Other (See Comments)    Plastic tape tears skin  . Bactrim Ds [Sulfamethoxazole-Trimethoprim] Other (See Comments)    Hand tremors    Patient Measurements: Height: 5' 7.99" (172.7 cm) Weight: 174 lb 9.6 oz (79.2 kg) IBW/kg (Calculated) : 68.38 Heparin Dosing Weight:   Vital Signs: Temp: 98.4 F (36.9 C) (08/28 1639) Temp Source: Oral (08/28 1639) BP: 93/59 (08/28 1729) Pulse Rate: 64 (08/28 1729)  Labs:  Recent Labs  10/26/16 2230 10/27/16 3149  10/27/16 0606 10/27/16 7026 10/27/16 0808  10/28/16 0704 10/28/16 1220 10/28/16 2042 10/29/16 0729  HGB  --   --   < > 13.5  --   --   --  11.3*  --   --  11.4*  HCT  --   --   --  41.7  --   --   --  34.0*  --   --  33.5*  PLT  --   --   --  185  --   --   --  125*  --   --  124*  LABPROT  --  22.0*  --   --   --   --   --   --   --   --   --   INR  --  1.90  --   --   --   --   --   --   --   --   --   HEPARINUNFRC  --  0.94*  --   --   --   --   < > <0.10* 0.14* 0.45 0.50  CREATININE  --   --   --  2.98*  --   --   --  3.78*  --   --  4.36*  TROPONINI 2.62*  --   --   --  39.06* 46.73*  --   --   --   --   --   < > = values in this interval not displayed.  Estimated Creatinine Clearance: 13.1 mL/min (A) (by C-G formula based on SCr of 4.36 mg/dL (H)).   Medications:  Scheduled:  . aspirin EC  81 mg Oral Daily  . atorvastatin  80 mg Oral q1800  . enoxaparin (LOVENOX) injection  1 mg/kg Subcutaneous Q24H  . finasteride  5 mg Oral Daily  . [START ON 10/30/2016] gabapentin  300 mg Oral QHS  . insulin aspart  0-15 Units Subcutaneous Q4H  . [START ON 10/30/2016] insulin glargine  8 Units Subcutaneous Daily  . levalbuterol  1.25 mg Nebulization TID  . sodium chloride flush  3 mL Intravenous Q12H   Infusions:    Assessment: 82 yo male with ACS will be transitioned from  heparin to lovenox.  Patient has renal dysfunction with CrCl < 30  Goal of Therapy:  Anti-Xa level 0.6-1 units/ml 4hrs after LMWH dose given Monitor platelets by anticoagulation protocol: Yes   Plan:  - Start lovenox 80 mg sq q24h 1hr after heparin drip is discontinued - CBC every 72 hours - Monitor renal function  Quinnlan Abruzzo, Tsz-Yin 10/29/2016,6:53 PM

## 2016-10-29 NOTE — Progress Notes (Addendum)
   10/28/16 2026  Vitals  BP (!) 61/49  MAP (mmHg) (!) 55  BP Location Left Arm  BP Method Manual   Pt. Was also nauseated, SOB. and using his accessory muscles to breathe. Given IV zofran and Bipap restarted.   Txtpaged Dr. Myna Hidalgo regarding low BP. Received order for a 250 bolus of NS.   Re-checked after bolus is 91/55. Will monitor closely.

## 2016-10-29 NOTE — Progress Notes (Signed)
PROGRESS NOTE    [name]  JHE:[id number] DOB: [birth year] DOA: 10/26/2016 PCP: Laurey Morale, MD   Brief Narrative:  82 y.o. male PMHx Depression, CAD S/P LAD, Chronic Systolic CHF, Atrial fibrillation on warfarin,Renal artery stenosis S/P stenting 2009, OSA, PAD S/P Left AKA, Diabetes type 2 uncontrolled with complication,  Presenting to the emergency department for evaluation of chest pain. Patient reports that he had experienced 1-2 weeks of intermittent left arm discomfort, described as an ache that was worse with exertion and better with Advil. Then today, he experienced pain in the central and left chest, described as pressure-like, nonradiating, associated with dyspnea, not associated with nausea or diaphoresis, and lasting one hour until he took one nitroglycerin. Patient denies fevers or chills, denies abdominal pain, and denies melena or hematochezia.   [facility] ED Course: Upon arrival to the [facility] ED, patient is found to be afebrile, saturating well on room air, and with vital signs stable. EKG featured a ventricular paced rhythm and chest x-ray was negative for acute cardiopulmonary disease. Chemistry panel revealed a potassium of 5.3, BUN 55, and creatinine of 2.55, up from his apparent baseline of roughly 2.0. CBC is unremarkable. INR is subtherapeutic at 1.67. Troponin is elevated to 4.79. Patient was given 324 mg aspirin and started on heparin infusion. Cardiology was consulted by the ED physician and recommended a medical admission with cardiology to consult. Patient remained hemodynamically stable and in no apparent respiratory distress and was to be admitted to the telemetry unit [facility] for ongoing evaluation and management of N-STEMI, but developed acute hypoxic respiratory failure while en route and will be admitted to the stepdown unit.    Subjective: 8/28 A/O 4, negative SOB, negative CP, negative N/V, negative abdominal pain, positive  fatigue/malaise     Assessment & Plan:   Principal Problem:   NSTEMI (non-ST elevated myocardial infarction) ([place]) Active Problems:   Essential hypertension   Chronic atrial fibrillation (HCC)   Chronic systolic congestive heart failure (HCC)   CAD (coronary artery disease)   PAD (peripheral artery disease) (HCC)   CKD (chronic kidney disease) stage 4, GFR 15-29 ml/min (HCC)   DM (diabetes mellitus), type 2, uncontrolled, with renal complications (HCC)   AKI (acute kidney injury) ([place])   Hyperkalemia   Acute on chronic respiratory failure with hypoxia (HCC)   Acute on chronic combined systolic and diastolic CHF (congestive heart failure) (HCC)   Acute kidney injury superimposed on CKD ([place])    NSTEMI   - Pt presents with chest pain that resolved with 1 NTG prior to arrival; this was proceeded by 1-2 wks of left arm discomfort -Elevated troponin 4.79 ---> 46.7 - EKG features a ventricular-paced rhythm without appreciable acute change  - He has known CAD with stent to LAD, followed by cardiology   -Echocardiogram: Chronic diastolic CHF see results below -DC Amlodipine  -DC Coreg -Change Lasix 20 mg BID PRN -Cardiology; would like to perform cardiac catheterization when patient's renal function improves. Patient does not want long-term HD for cardiology holding on cardiac catheterization. -Aspirin+ Lovenox per pharmacy for NSTEMI. Reduce fluid load  Chronic Diastolic CHF -Strict in and out since admission +2.0 L -Daily weight Filed Weights   10/27/16 0447 10/28/16 0532 10/29/16 0603  Weight: 170 lb 8 oz (77.3 kg) 176 lb 8 oz (80.1 kg) 174 lb 9.6 oz (79.2 kg)  -All BP medication stopped secondary to hypotension  Pulmonary hypertension -See CHF  Atrial fibrillation (  CHADS-VASc is 6 ) - In a paced-rhythm on arrival  -- Lovenox per pharmacy -PTA medication Warfarin    Recent Labs Lab 10/26/16 1400 10/27/16 0605  INR 1.67 1.90  -Subtherapeutic on admission  Essential  HTN/Hypotension -See NSTEMI  -Overnight patient hypotensive received normal saline 250 mL bolus -8/28 page by RN Melynn patient again hypotensive, on several measurements over several hours. Normal saline 500 bolus +. Albumin 50 g -Map goal> 65 -Patient continues to be hypotensive, now with small left pleural effusion by [facility] and some chest discomfort although SPO2 mid 90s. -Patient not responding well to fluid boluses, and DO NOT RESUSCITATE therefore pressors not available for use   Acute hypoxic respiratory failure  -Multifactorial NSTEMI, chronic diastolic CHF, acute on CKD stage III, hypotension - Per patient previously not on O2, however when off O2 SPO2 drops into low 80s. -Improved but remains on 2 L O2---> SPO2 mid 90s -Titrate O2 to maintain SPO2> 93%   OSA -CPAP/BIPAP per respiratory   Acute on CKD stage III (baseline Cr ~2) @@@@    Recent Labs Lab 10/26/16 1400 10/27/16 0606 10/28/16 0704 10/29/16 0729  CREATININE 2.55* 2.98* 3.78* 4.36*  -multifactorial to include CKD secondary to ischemic neuropathy,NSTEMI, iatrogenic (CHF medication), Hypotension (end organ hypoperfusion)     Diabetes type 2 controlled with Renal complication   -09/4257 Hemoglobin A1c= 7.0 -8/28 increase Lantus 8 units daily -8/28 moderate SSI  Hyperkalemia  - Serum potassium mildly elevated to 5.3  - Treated with Lasix IVP on arrival to [facility] - Continue cardiac monitoring, hold losartan, trend potassium. Spoke with nephrology today patient amenable to temporary HD if necessary     -8/28 Kayexalate 30 g   DVT prophylaxis: Heparin per pharmacy Code Status: DO NOT RESUSCITATE Family Communication: None Disposition Plan: Per cardiology   Consultants:  Cardiology Nephrology    Procedures/Significant Events:  8/27 Echocardiogram:Left ventricle:  moderate concentric hypertrophy. -LVEF = 65% to 70%.  -Grade 2 Diastolic dysfunction -Left atrium: moderately dilated. - Right ventricle: Pacer  wire or catheter noted in right ventricle. Systolic function was normal. - Right atrium: moderately dilated. Pacer wire or catheter noted in right atrium. -Pulmonary arteries: PA peak pressure: 44 mm Hg (S).    I have personally reviewed and interpreted all radiology studies and my findings are as above.  VENTILATOR SETTINGS:    Cultures 8/25 MRSA by PCR negative 8/26 urine pending 8/26 sputum pending    Antimicrobials: Anti-infectives    None       Devices    LINES / TUBES:      Continuous Infusions: . heparin 1,100 Units/hr (10/28/16 1743)     Objective: Vitals:   10/29/16 0341 10/29/16 0514 10/29/16 0603 10/29/16 0746  BP:  (!) 93/51  (!) 107/56  Pulse: 77   62  Resp:  20    Temp:   98.6 F (37 C) 98.9 F (37.2 C)  TempSrc:   Oral Oral  SpO2: 100% 95%  94%  Weight:   174 lb 9.6 oz (79.2 kg)   Height:        Intake/Output Summary (Last 24 hours) at 10/29/16 0849 Last data filed at 10/29/16 0703  Gross per 24 hour  Intake           636.93 ml  Output              250 ml  Net           386.93 ml   [employer]  10/27/16 0447 10/28/16 0532 10/29/16 0603  Weight: 170 lb 8 oz (77.3 kg) 176 lb 8 oz (80.1 kg) 174 lb 9.6 oz (79.2 kg)    Examination:  General: A/O 4, positive acute respiratory distress Neck:  Negative scars, masses, torticollis, lymphadenopathy, JVD Lungs: Clear to auscultation bilaterally, Negative expiratory wheeze, or crackles Cardiovascular: Regular rate and rhythm, negative murmurs rubs or gallops, normal S1/S2 Abdomen: Negative abdominal pain, negative distention, positive bowel sounds, Extremities: No significant cyanosis, clubbing. At AKA, stump appears well-healed. Psychiatric: Positive depression, positive anxiety  negative fatigue, negative mania  Central nervous system:  Cranial nerves II through XII intact, tongue/uvula midline, all extremities muscle strength 5/5, sensation intact throughout, negative dysarthria,  negative expressive aphasia, negative receptive aphasia.  .     Data Reviewed: Care during the described time interval was provided by me .  I have reviewed this patient's available data, including medical history, events of note, physical examination, and all test results as part of my evaluation.   CBC:  Recent Labs Lab 10/26/16 1322 10/27/16 0606 10/28/16 0704 10/29/16 0729  WBC 9.4 16.6* 12.6* 12.7*  HGB 13.3 13.5 11.3* 11.4*  HCT 39.8 41.7 34.0* 33.5*  MCV 82.7 84.2 83.7 82.7  PLT 166 185 125* 962*   Basic Metabolic Panel:  Recent Labs Lab 10/26/16 1400  10/27/16 0606  10/28/16 0704  10/28/16 1424 10/28/16 1630 10/28/16 2042 10/29/16 0128 10/29/16 0729  NA 137  --  136  --  137  --   --   --   --   --  137  K 5.3*  < > 6.7*  < > 4.6  4.6  < > 4.6 4.6 4.7 4.6 4.3  CL 108  --  106  --  105  --   --   --   --   --  106  CO2 20*  --  16*  --  17*  --   --   --   --   --  18*  GLUCOSE 127*  --  137*  --  125*  --   --   --   --   --  105*  BUN 55*  --  60*  --  82*  --   --   --   --   --  98*  CREATININE 2.55*  --  2.98*  --  3.78*  --   --   --   --   --  4.36*  CALCIUM 8.8*  --  9.0  --  8.5*  --   --   --   --   --  8.4*  MG  --   --   --   --  2.3  --   --   --   --   --  2.4  < > = values in this interval not displayed. GFR: Estimated Creatinine Clearance: 13.1 mL/min (A) (by C-G formula based on SCr of 4.36 mg/dL (H)). Liver Function Tests:  Recent Labs Lab 10/28/16 0704  AST 118*  ALT 33  ALKPHOS 45  BILITOT 2.4*  PROT 5.8*  ALBUMIN 3.3*   No results for input(s): LIPASE, AMYLASE in the last 168 hours. No results for input(s): AMMONIA in the last 168 hours. Coagulation Profile:  Recent Labs Lab 10/26/16 1400 10/27/16 0605  INR 1.67 1.90   Cardiac Enzymes:  Recent Labs Lab 10/26/16 1400 10/26/16 2230 10/27/16 0607 10/27/16 0808  TROPONINI 4.79* 2.62* 39.06* 46.73*  BNP (last 3 results) No results for input(s): PROBNP in the last  8760 hours. HbA1C: No results for input(s): HGBA1C in the last 72 hours. CBG:  Recent Labs Lab 10/28/16 1624 10/28/16 2128 10/29/16 0010 10/29/16 0510 10/29/16 0745  GLUCAP 201* 222* 136* 114* 118*   Lipid Profile: No results for input(s): CHOL, HDL, LDLCALC, TRIG, CHOLHDL, LDLDIRECT in the last 72 hours. Thyroid Function Tests: No results for input(s): TSH, T4TOTAL, FREET4, T3FREE, THYROIDAB in the last 72 hours. Anemia Panel: No results for input(s): VITAMINB12, FOLATE, FERRITIN, TIBC, IRON, RETICCTPCT in the last 72 hours. Urine analysis:    Component Value Date/Time   COLORURINE YELLOW 10/28/2016 [place] 10/28/2016 2344   LABSPEC 1.014 10/28/2016 2344   PHURINE 5.0 10/28/2016 2344   GLUCOSEU NEGATIVE 10/28/2016 2344   HGBUR NEGATIVE 10/28/2016 2344   HGBUR negative 12/29/2008 0818   BILIRUBINUR NEGATIVE 10/28/2016 2344   BILIRUBINUR N 09/13/2016 1023   KETONESUR NEGATIVE 10/28/2016 2344   PROTEINUR NEGATIVE 10/28/2016 2344   UROBILINOGEN 0.2 09/13/2016 1023   UROBILINOGEN 0.2 06/15/2014 1608   NITRITE NEGATIVE 10/28/2016 2344   LEUKOCYTESUR TRACE (A) 10/28/2016 2344   Sepsis Labs: @LABRCNTIP (procalcitonin:4,lacticidven:4)  ) Recent Results (from the past 240 hour(s))  MRSA PCR Screening     Status: None   Collection Time: 10/26/16 10:47 PM  Result Value Ref Range Status   MRSA by PCR NEGATIVE NEGATIVE Final    Comment:        The GeneXpert MRSA Assay (FDA approved for NASAL specimens only), is one component of a comprehensive MRSA colonization surveillance program. It is not intended to diagnose MRSA infection nor to guide or monitor treatment for MRSA infections.   Urine Culture     Status: None   Collection Time: 10/27/16  6:49 AM  Result Value Ref Range Status   Specimen Description URINE, RANDOM  Final   Special Requests NONE  Final   Culture NO GROWTH  Final   Report Status 10/28/2016 FINAL  Final  Culture, expectorated  sputum-assessment     Status: None   Collection Time: 10/27/16  7:47 AM  Result Value Ref Range Status   Specimen Description SPUTUM  Final   Special Requests NONE  Final   Sputum evaluation   Final    Sputum specimen not acceptable for testing.  Please recollect.   Gram Stain Report Called to,Read Back By and Verified With: K HUTCHINS,RN AT 3536 10/27/16 BY L BENFIELD    Report Status 10/27/2016 FINAL  Final         Radiology Studies: Dg Chest 2 View  Result Date: 10/27/2016 CLINICAL DATA:  Shortness of breath EXAM: CHEST  2 VIEW COMPARISON:  10/26/2016 FINDINGS: Mild right upper lobe/ perihilar edema, improved. Small left pleural effusion. Cardiomegaly.  Left subclavian ICD. IMPRESSION: Cardiomegaly with mild perihilar edema, improved. Small left pleural effusion. Electronically Signed   By: Julian Hy M.D.   On: 10/27/2016 10:31   US Renal  Result Date: 10/28/2016 CLINICAL DATA:  82 year old male with acute renal failure. EXAM: RENAL / URINARY TRACT ULTRASOUND COMPLETE COMPARISON:  06/05/2016 and prior CTs FINDINGS: Right Kidney: Length: 7.6 cm with moderate cortical thinning. Echogenicity is increased. There is no evidence of solid mass or hydronephrosis. Multiple cysts are present. Left Kidney: Length: 12.6 cm. Echogenicity is upper limits of normal. No evidence of solid mass or hydronephrosis. Multiple cysts are present. Bladder: Appears normal for degree of bladder distention. IMPRESSION: 1. Small atrophic right kidney.  2. Upper limits of normal left renal echogenicity which may represent medical renal disease. 3. No evidence of hydronephrosis. Electronically Signed   By: Margarette Canada M.D.   On: 10/28/2016 20:32   Dg Chest Port 1 View  Result Date: 10/28/2016 CLINICAL DATA:  Shortness of breath. History of CHF, coronary artery disease, diabetes, current smoker. EXAM: PORTABLE CHEST 1 VIEW COMPARISON:  PA and lateral chest x-ray of October 27, 2016 and October 26, 2016. FINDINGS:  The lungs are reasonably well inflated. The cardiac silhouette remains enlarged. The pulmonary vascularity is prominent centrally but is less engorged today. There is calcification in the wall of the aortic arch. There is a small left pleural effusion. The ICD is in stable position. The bony thorax exhibits no acute abnormality. IMPRESSION: CHF with mild pulmonary vascular congestion which has improved since the 26 October 2016 study. There remain coarse infrahilar lung markings on the right which may reflect atelectasis or pneumonia. Continued follow-up radiographs are recommended. Thoracic aortic atherosclerosis. Electronically Signed   By: David  Martinique M.D.   On: 10/28/2016 13:14        Scheduled Meds: . aspirin EC  81 mg Oral Daily  . atorvastatin  80 mg Oral q1800  . carvedilol  12.5 mg Oral BID WC  . finasteride  5 mg Oral Daily  . furosemide  40 mg Intravenous Q12H  . gabapentin  600 mg Oral BID  . insulin aspart  0-9 Units Subcutaneous Q4H  . insulin glargine  5 Units Subcutaneous Daily  . levalbuterol  1.25 mg Nebulization TID  . sodium chloride flush  3 mL Intravenous Q12H   Continuous Infusions: . heparin 1,100 Units/hr (10/28/16 1743)     LOS: 3 days    Time spent: 40 minutes    Tyresse Jayson, Geraldo Docker, MD [facility] Pager [phone]   If 7PM-7AM, please contact night-coverage [email or web address] Password [facility] 10/29/2016, 8:49 AM

## 2016-10-30 DIAGNOSIS — I480 Paroxysmal atrial fibrillation: Secondary | ICD-10-CM

## 2016-10-30 DIAGNOSIS — I9589 Other hypotension: Secondary | ICD-10-CM

## 2016-10-30 LAB — CBC
HEMATOCRIT: 32.4 % — AB (ref 39.0–52.0)
HEMOGLOBIN: 10.7 g/dL — AB (ref 13.0–17.0)
MCH: 27.7 pg (ref 26.0–34.0)
MCHC: 33 g/dL (ref 30.0–36.0)
MCV: 83.9 fL (ref 78.0–100.0)
Platelets: 146 10*3/uL — ABNORMAL LOW (ref 150–400)
RBC: 3.86 MIL/uL — ABNORMAL LOW (ref 4.22–5.81)
RDW: 15.9 % — AB (ref 11.5–15.5)
WBC: 10.3 10*3/uL (ref 4.0–10.5)

## 2016-10-30 LAB — RENAL FUNCTION PANEL
Albumin: 3.5 g/dL (ref 3.5–5.0)
Anion gap: 12 (ref 5–15)
BUN: 113 mg/dL — AB (ref 6–20)
CALCIUM: 8 mg/dL — AB (ref 8.9–10.3)
CO2: 18 mmol/L — AB (ref 22–32)
CREATININE: 5.19 mg/dL — AB (ref 0.61–1.24)
Chloride: 105 mmol/L (ref 101–111)
GFR calc non Af Amer: 9 mL/min — ABNORMAL LOW (ref >60)
GFR, EST AFRICAN AMERICAN: 11 mL/min — AB (ref >60)
Glucose, Bld: 141 mg/dL — ABNORMAL HIGH (ref 65–99)
Phosphorus: 6.5 mg/dL — ABNORMAL HIGH (ref 2.5–4.6)
Potassium: 4.4 mmol/L (ref 3.5–5.1)
SODIUM: 135 mmol/L (ref 135–145)

## 2016-10-30 LAB — GLUCOSE, CAPILLARY
GLUCOSE-CAPILLARY: 119 mg/dL — AB (ref 65–99)
GLUCOSE-CAPILLARY: 125 mg/dL — AB (ref 65–99)
GLUCOSE-CAPILLARY: 190 mg/dL — AB (ref 65–99)
Glucose-Capillary: 148 mg/dL — ABNORMAL HIGH (ref 65–99)
Glucose-Capillary: 130 mg/dL — ABNORMAL HIGH (ref 65–99)
Glucose-Capillary: 114 mg/dL — ABNORMAL HIGH (ref 65–99)
Glucose-Capillary: 153 mg/dL — ABNORMAL HIGH (ref 65–99)
Glucose-Capillary: 150 mg/dL — ABNORMAL HIGH (ref 65–99)

## 2016-10-30 LAB — URINE CULTURE: Culture: NO GROWTH

## 2016-10-30 LAB — MAGNESIUM: MAGNESIUM: 2.3 mg/dL (ref 1.7–2.4)

## 2016-10-30 LAB — POTASSIUM: POTASSIUM: 5 mmol/L (ref 3.5–5.1)

## 2016-10-30 MED ORDER — HEPARIN (PORCINE) IN NACL 100-0.45 UNIT/ML-% IJ SOLN: 1100.0000 [IU]/h | INTRAMUSCULAR | Status: DC

## 2016-10-30 MED ORDER — HEPARIN (PORCINE) IN NACL 100-0.45 UNIT/ML-% IJ SOLN
1100.0000 [IU]/h | INTRAMUSCULAR | Status: DC
  Administered 2016-10-30: 1100 [IU]/h via INTRAVENOUS
  Filled 2016-10-30: qty 250

## 2016-10-30 MED ORDER — SODIUM POLYSTYRENE SULFONATE 15 GM/60ML PO SUSP
30.0000 g | Freq: Once | ORAL | Status: DC
  Filled 2016-10-30: qty 120

## 2016-10-30 NOTE — Progress Notes (Signed)
RT note: Patient placed on Bipap 12/6 40% due to use of accessory muscles and patient feeling he was struggling to catch his breath. Patients vitals are stable, and patient is resting comfortably. RT will continue to monitor.

## 2016-10-30 NOTE — Progress Notes (Signed)
PROGRESS NOTE    [name]  PNT:[id number] DOB: 1934-10-28 DOA: 10/26/2016 PCP: Laurey Morale, MD   Brief Narrative:  82 y.o. male PMHx Depression, CAD S/P LAD, Chronic Systolic CHF, Atrial fibrillation on warfarin,Renal artery stenosis S/P stenting 2009, OSA, PAD S/P Left AKA, Diabetes type 2 uncontrolled with complication,  Presenting to the emergency department for evaluation of chest pain. Patient reports that he had experienced 1-2 weeks of intermittent left arm discomfort, described as an ache that was worse with exertion and better with Advil. Then today, he experienced pain in the central and left chest, described as pressure-like, nonradiating, associated with dyspnea, not associated with nausea or diaphoresis, and lasting one hour until he took one nitroglycerin. Patient denies fevers or chills, denies abdominal pain, and denies melena or hematochezia.   [facility] ED Course: Upon arrival to the [facility] ED, patient is found to be afebrile, saturating well on room air, and with vital signs stable. EKG featured a ventricular paced rhythm and chest x-ray was negative for acute cardiopulmonary disease. Chemistry panel revealed a potassium of 5.3, BUN 55, and creatinine of 2.55, up from his apparent baseline of roughly 2.0. CBC is unremarkable. INR is subtherapeutic at 1.67. Troponin is elevated to 4.79. Patient was given 324 mg aspirin and started on heparin infusion. Cardiology was consulted by the ED physician and recommended a medical admission with cardiology to consult. Patient remained hemodynamically stable and in no apparent respiratory distress and was to be admitted to the telemetry unit [facility] for ongoing evaluation and management of N-STEMI, but developed acute hypoxic respiratory failure while en route and will be admitted to the stepdown unit.    Subjective: 8/29 A/O 4, positive SOB, negative CP, negative N/V, negative abdominal  pain    Assessment & Plan:   Principal Problem:   NSTEMI (non-ST elevated myocardial infarction) ([place]) Active Problems:   Essential hypertension   Chronic atrial fibrillation (HCC)   Chronic systolic congestive heart failure (HCC)   CAD (coronary artery disease)   PAD (peripheral artery disease) (HCC)   CKD (chronic kidney disease) stage 4, GFR 15-29 ml/min (HCC)   DM (diabetes mellitus), type 2, uncontrolled, with renal complications (HCC)   AKI (acute kidney injury) ([place])   Hyperkalemia   Acute on chronic respiratory failure with hypoxia (HCC)   Acute on chronic combined systolic and diastolic CHF (congestive heart failure) (HCC)   Acute kidney injury superimposed on CKD ([place])    NSTEMI   - Pt presents with chest pain that resolved with 1 NTG prior to arrival; this was proceeded by 1-2 wks of left arm discomfort -Elevated troponin 4.79 ---> 46.7 - EKG features a ventricular-paced rhythm without appreciable acute change  - Known CAD with stent to LAD followed by cardiology  -Echocardiogram: Chronic diastolic CHF see results below -Hold Amlodipine, Coreg secondary hypotension   --Lasix 20 mg BID PRN would only use if BiPAP fails (secondary hypotension) ( -Cardiology will perform cardiac catheterization if patient's renal function improves. Appears unlikely. Patient does not want long-term HD -Aspirin + Heparin   Chronic Diastolic CHF -Strict in and out since admission  -Daily weight Filed Weights   10/28/16 0532 10/29/16 0603 10/30/16 0500  Weight: 176 lb 8 oz (80.1 kg) 174 lb 9.6 oz (79.2 kg) 179 lb 7.3 oz (81.4 kg)  -All BP medication stopped secondary to hypotension  Pulmonary hypertension -See CHF  Paroxysmal Atrial fibrillation ( CHADS-VASc is 6 ) -Heparin drip per  pharmacy. -PTA medication Warfarin     Recent Labs Lab 10/26/16 1400 10/27/16 0605  INR 1.67 1.90  -Subtherapeutic on admission  Essential HTN/Hypotension -See NSTEMI   -Patient continues to  have lengthy episodes of hypotension despite maximum treatment. -Map goal> 65 -Patient with crackles and increase WOB now on BiPAP -Not responding well to fluid boluses, DO NOT RESUSCITATE therefore pressors not available for use    Acute hypoxic respiratory failure  -Multifactorial NSTEMI, chronic diastolic CHF, acute on CKD stage III, hypotension -Currently on BiPAP  -Titrate O2 to maintain SPO2> 93%   OSA -CPAP/BIPAP per respiratory   Acute on CKD stage III (baseline Cr ~2)   Recent Labs Lab 10/26/16 1400 10/27/16 0606 10/28/16 0704 10/29/16 0729 10/30/16 0151  CREATININE 2.55* 2.98* 3.78* 4.36* 5.19*  -Multifactorial secondary NSTEMI` iatrogenic (CHF medication), Hypoperfusion (end organ hypoperfusion)     Diabetes type 2 controlled with Renal complication   -04/5425 Hemoglobin A1c= 7.0 -Lantus 8 units daily -Moderate SSI   Hyperkalemia  - Serum potassium increasing with increased renal failure  - Treated with Lasix IVP on arrival to [facility] - Continue cardiac monitoring, hold losartan, trend potassium  -8/29 Kayexalate 30 g   Goals of care -8/29 PALLIATIVE CARE consult:,NSTEMI unable to calf secondary to acute on chronic renal failure in patient who does not want to go on HD. Short-term vs long-term goals of care. Hospice -Spoke at length with Raidyn Wassink [phone] who arrived here from [place] today. Discussed patient's wishes and the likely outcome of death. In addition spoke to [name]'s daughter who is an Therapist, sports in [place]. [name] plans on contacting the remainder of family, and advising them they need to come and visit.   DVT prophylaxis: Heparin per pharmacy Code Status: DO NOT RESUSCITATE Family Communication: Lamona Curl son Disposition Plan: Per cardiology   Consultants:  Cardiology Nephrology    Procedures/Significant Events:  8/27 Echocardiogram:Left ventricle:  moderate concentric hypertrophy. -LVEF = 65% to 70%.  -Grade 2 Diastolic dysfunction -Left  atrium: moderately dilated. - Right ventricle: Pacer wire or catheter noted in right ventricle. Systolic function was normal. - Right atrium: moderately dilated. Pacer wire or catheter noted in right atrium. -Pulmonary arteries: PA peak pressure: 44 mm Hg (S).    I have personally reviewed and interpreted all radiology studies and my findings are as above.  VENTILATOR SETTINGS:    Cultures 8/25 MRSA by PCR negative 8/26 urine negative 8/26 sputum not acceptable 8/27 urine negative    Antimicrobials: Anti-infectives    None       Devices    LINES / TUBES:      Continuous Infusions:    Objective: Vitals:   10/29/16 2200 10/29/16 2242 10/30/16 0500 10/30/16 0746  BP: (!) 94/55 (!) 99/56 90/62 110/63  Pulse: 66 62 62 68  Resp:    20  Temp:   98.1 F (36.7 C) 97.9 F (36.6 C)  TempSrc:   Oral Oral  SpO2: 96% 97% 96% 96%  Weight:   179 lb 7.3 oz (81.4 kg)   Height:        Intake/Output Summary (Last 24 hours) at 10/30/16 0804 Last data filed at 10/30/16 0700  Gross per 24 hour  Intake          1406.15 ml  Output              400 ml  Net          1006.15 ml   [employer]  10/28/16 0532 10/29/16 0603 10/30/16 0500  Weight: 176 lb 8 oz (80.1 kg) 174 lb 9.6 oz (79.2 kg) 179 lb 7.3 oz (81.4 kg)   Physical Exam:  General: A/O 4, positive acute respiratory distress now on BiPAP  Neck:  Negative scars, masses, torticollis, lymphadenopathy, JVD Lungs: positive diffuse crackles, negative wheeze  Cardiovascular: Regular rate and rhythm without murmur gallop or rub normal S1 and S2 Abdomen: negative abdominal pain, nondistended, positive soft, bowel sounds, no rebound, no ascites, no appreciable mass Extremities: No significant cyanosis, clubbing. Left AKA stump appears well-healed Skin: Negative rashes, lesions, ulcers Psychiatric:  Negative depression, negative anxiety, negative fatigue, negative mania  Central nervous system:  Cranial nerves II through  XII intact, tongue/uvula midline, all extremities muscle strength 5/5, sensation intact throughout, negative dysarthria, negative expressive aphasia, negative receptive aphasia.  .     Data Reviewed: Care during the described time interval was provided by me .  I have reviewed this patient's available data, including medical history, events of note, physical examination, and all test results as part of my evaluation.   CBC:  Recent Labs Lab 10/26/16 1322 10/27/16 0606 10/28/16 0704 10/29/16 0729 10/30/16 0151  WBC 9.4 16.6* 12.6* 12.7* 10.3  HGB 13.3 13.5 11.3* 11.4* 10.7*  HCT 39.8 41.7 34.0* 33.5* 32.4*  MCV 82.7 84.2 83.7 82.7 83.9  PLT 166 185 125* 124* 409*   Basic Metabolic Panel:  Recent Labs Lab 10/26/16 1400  10/27/16 0606  10/28/16 0704  10/29/16 0128 10/29/16 0729 10/29/16 1005 10/29/16 1523 10/30/16 0151  NA 137  --  136  --  137  --   --  137  --   --  135  K 5.3*  < > 6.7*  < > 4.6  4.6  < > 4.6 4.3 4.5 5.1 4.4  CL 108  --  106  --  105  --   --  106  --   --  105  CO2 20*  --  16*  --  17*  --   --  18*  --   --  18*  GLUCOSE 127*  --  137*  --  125*  --   --  105*  --   --  141*  BUN 55*  --  60*  --  82*  --   --  98*  --   --  113*  CREATININE 2.55*  --  2.98*  --  3.78*  --   --  4.36*  --   --  5.19*  CALCIUM 8.8*  --  9.0  --  8.5*  --   --  8.4*  --   --  8.0*  MG  --   --   --   --  2.3  --   --  2.4  --   --  2.3  PHOS  --   --   --   --   --   --   --   --   --   --  6.5*  < > = values in this interval not displayed. GFR: Estimated Creatinine Clearance: 11 mL/min (A) (by C-G formula based on SCr of 5.19 mg/dL (H)). Liver Function Tests:  Recent Labs Lab 10/28/16 0704 10/30/16 0151  AST 118*  --   ALT 33  --   ALKPHOS 45  --   BILITOT 2.4*  --   PROT 5.8*  --   ALBUMIN 3.3* 3.5   No results  for input(s): LIPASE, AMYLASE in the last 168 hours. No results for input(s): AMMONIA in the last 168 hours. Coagulation Profile:  Recent  Labs Lab 10/26/16 1400 10/27/16 0605  INR 1.67 1.90   Cardiac Enzymes:  Recent Labs Lab 10/26/16 1400 10/26/16 2230 10/27/16 0607 10/27/16 0808  TROPONINI 4.79* 2.62* 39.06* 46.73*   BNP (last 3 results) No results for input(s): PROBNP in the last 8760 hours. HbA1C: No results for input(s): HGBA1C in the last 72 hours. CBG:  Recent Labs Lab 10/29/16 1136 10/29/16 1638 10/30/16 0109 10/30/16 0514 10/30/16 0745  GLUCAP 186* 210* 148* 130* 125*   Lipid Profile: No results for input(s): CHOL, HDL, LDLCALC, TRIG, CHOLHDL, LDLDIRECT in the last 72 hours. Thyroid Function Tests: No results for input(s): TSH, T4TOTAL, FREET4, T3FREE, THYROIDAB in the last 72 hours. Anemia Panel: No results for input(s): VITAMINB12, FOLATE, FERRITIN, TIBC, IRON, RETICCTPCT in the last 72 hours. Urine analysis:    Component Value Date/Time   COLORURINE YELLOW 10/28/2016 [place] 10/28/2016 2344   LABSPEC 1.014 10/28/2016 2344   PHURINE 5.0 10/28/2016 2344   GLUCOSEU NEGATIVE 10/28/2016 2344   HGBUR NEGATIVE 10/28/2016 2344   HGBUR negative 12/29/2008 0818   BILIRUBINUR NEGATIVE 10/28/2016 2344   BILIRUBINUR N 09/13/2016 1023   KETONESUR NEGATIVE 10/28/2016 2344   PROTEINUR NEGATIVE 10/28/2016 2344   UROBILINOGEN 0.2 09/13/2016 1023   UROBILINOGEN 0.2 06/15/2014 1608   NITRITE NEGATIVE 10/28/2016 2344   LEUKOCYTESUR TRACE (A) 10/28/2016 2344   Sepsis Labs: @LABRCNTIP (procalcitonin:4,lacticidven:4)  ) Recent Results (from the past 240 hour(s))  MRSA PCR Screening     Status: None   Collection Time: 10/26/16 10:47 PM  Result Value Ref Range Status   MRSA by PCR NEGATIVE NEGATIVE Final    Comment:        The GeneXpert MRSA Assay (FDA approved for NASAL specimens only), is one component of a comprehensive MRSA colonization surveillance program. It is not intended to diagnose MRSA infection nor to guide or monitor treatment for MRSA infections.   Urine  Culture     Status: None   Collection Time: 10/27/16  6:49 AM  Result Value Ref Range Status   Specimen Description URINE, RANDOM  Final   Special Requests NONE  Final   Culture NO GROWTH  Final   Report Status 10/28/2016 FINAL  Final  Culture, expectorated sputum-assessment     Status: None   Collection Time: 10/27/16  7:47 AM  Result Value Ref Range Status   Specimen Description SPUTUM  Final   Special Requests NONE  Final   Sputum evaluation   Final    Sputum specimen not acceptable for testing.  Please recollect.   Gram Stain Report Called to,Read Back By and Verified With: K HUTCHINS,RN AT 3419 10/27/16 BY L BENFIELD    Report Status 10/27/2016 FINAL  Final         Radiology Studies: US Renal  Result Date: 10/28/2016 CLINICAL DATA:  82 year old male with acute renal failure. EXAM: RENAL / URINARY TRACT ULTRASOUND COMPLETE COMPARISON:  06/05/2016 and prior CTs FINDINGS: Right Kidney: Length: 7.6 cm with moderate cortical thinning. Echogenicity is increased. There is no evidence of solid mass or hydronephrosis. Multiple cysts are present. Left Kidney: Length: 12.6 cm. Echogenicity is upper limits of normal. No evidence of solid mass or hydronephrosis. Multiple cysts are present. Bladder: Appears normal for degree of bladder distention. IMPRESSION: 1. Small atrophic right kidney. 2. Upper limits of normal left renal echogenicity which  may represent medical renal disease. 3. No evidence of hydronephrosis. Electronically Signed   By: Margarette Canada M.D.   On: 10/28/2016 20:32   Dg Chest Port 1 View  Result Date: 10/29/2016 CLINICAL DATA:  Pulmonary edema EXAM: PORTABLE CHEST 1 VIEW COMPARISON:  10/28/2016 FINDINGS: Left AICD remains in place, unchanged. Cardiomegaly. Scarring at the left base. Right lung is clear. No effusions or acute bony abnormality. IMPRESSION: Cardiomegaly.  Left basilar scarring.  No active disease. Electronically Signed   By: Rolm Baptise M.D.   On: 10/29/2016 18:33    Dg Chest Port 1 View  Result Date: 10/28/2016 CLINICAL DATA:  Shortness of breath. History of CHF, coronary artery disease, diabetes, current smoker. EXAM: PORTABLE CHEST 1 VIEW COMPARISON:  PA and lateral chest x-ray of October 27, 2016 and October 26, 2016. FINDINGS: The lungs are reasonably well inflated. The cardiac silhouette remains enlarged. The pulmonary vascularity is prominent centrally but is less engorged today. There is calcification in the wall of the aortic arch. There is a small left pleural effusion. The ICD is in stable position. The bony thorax exhibits no acute abnormality. IMPRESSION: CHF with mild pulmonary vascular congestion which has improved since the 26 October 2016 study. There remain coarse infrahilar lung markings on the right which may reflect atelectasis or pneumonia. Continued follow-up radiographs are recommended. Thoracic aortic atherosclerosis. Electronically Signed   By: David  Martinique M.D.   On: 10/28/2016 13:14        Scheduled Meds: . aspirin EC  81 mg Oral Daily  . atorvastatin  80 mg Oral q1800  . enoxaparin (LOVENOX) injection  1 mg/kg Subcutaneous Q24H  . finasteride  5 mg Oral Daily  . gabapentin  300 mg Oral QHS  . insulin aspart  0-15 Units Subcutaneous Q4H  . insulin glargine  8 Units Subcutaneous Daily  . levalbuterol  1.25 mg Nebulization TID  . sodium chloride flush  3 mL Intravenous Q12H   Continuous Infusions:    LOS: 4 days    Time spent: 40 minutes    WOODS, Geraldo Docker, MD [facility] Pager [phone]   If 7PM-7AM, please contact night-coverage [email or web address] Password [email or web address] 10/30/2016, 8:04 AM

## 2016-10-30 NOTE — Consult Note (Signed)
                     [facility] CM Primary Care Navigator  10/30/2016  MARCQUES WRIGHTSMAN Jun 29, 1934 [id number]   Patient was seen at the bedside to identify possible discharge needs. Patient shared having chest pain and left arm tingling which had led to this admission. Patient confirms that primary care provider is Dr. [name] with [facility] [place]. Patient reports he was driving prior to admission and was transporting self to his doctors' appointments. He hopes that son will be able to assist with transportation after discharge.  Patient states using [facility] on [address] Order service to obtain medications without difficulty. He verbalized managing his own medications at home using "pill box" system filled every 2 weeks. He mentioned that his son will be flying from [place] today and will assist him with his needs.  Discharge plan is undetermined at this time pending therapy recommendation and physician order. Notified Inpatient Care Manager and RN for follow-up of needs as appropriate.  Patient had expressed understanding to call primary care provider's office for a post discharge follow-up appointment within a week or sooner if needed. Patient letter (with PCP's contact number) was provided as a reminder.  Explained to patient regarding[facility] CM services available for health management. He voiced understanding to [facility] care management services from primary care provider asdeemed necessary in the future (on his next follow-up visit).  [facility] care management information provided for future needs that he may have.  Primary care provider's office called (Sheena)to notify of patient's health issues (mainly HF/ DM) needing follow-up.  Made aware to refer patient to [facility] care management ifdeemed appropriateand necessary for further services on his follow-up visit.  For questions, please contact:  Dannielle Huh, BSN,  RN- [facility] Primary Care Navigator  Telephone: [phone] [place]

## 2016-10-30 NOTE — Progress Notes (Addendum)
ANTICOAGULATION CONSULT NOTE - Follow Up Consult  Pharmacy Consult for lovenox > heparin Indication: NSTEMI and Afib  Labs:  Recent Labs  10/28/16 0704 10/28/16 1220 10/28/16 2042 10/29/16 0729 10/30/16 0151  HGB 11.3*  --   --  11.4* 10.7*  HCT 34.0*  --   --  33.5* 32.4*  PLT 125*  --   --  124* 146*  HEPARINUNFRC <0.10* 0.14* 0.45 0.50  --   CREATININE 3.78*  --   --  4.36* 5.19*    Assessment: 82 yo male with h/o Afib, ischemic heart disease with prior stenting, and AICD presents with CP and NSTEMI.  PTA coumadin on hold.  Lovenox consult placed yesterday 8/28, renal function continues to deteriorate, will now switch back to heparin per Dr. Sherral Hammers.   No bleeding noted. CBC largely unchanged and stable.  Goal of Therapy:  Heparin level 0.3-0.7 units/ml  Monitor platelets by anticoagulation protocol: Yes   Plan:  Discontinue lovenox Restart heparin drip at 1,100 units/hr at time of due lovenox  Confirmation Heparin level tomorrow at 0600 Daily Heparin level and CBC  Monitor for s/sx bleeding   Diana L. Kyung Rudd, PharmD, [place] PGY1 Pharmacy Resident Pager: [phone]

## 2016-10-30 NOTE — Progress Notes (Signed)
Patient ID: [name], male   DOB: February 16, 1935, 82 y.o.   MRN: [id number]  [facility] Progress Note   Assessment/ Plan:   1. Acute kidney injury on chronic kidney disease stage III/IV: Suspect multifactorial underlying chronic kidney disease center to which is likely ischemic nephropathy given his history of RAS status post stenting almost 10 years ago. Hemodynamically mediated acute kidney injury following NSTEMI and relative hypotension/hypoperfusion to kidneys-antihypertensive therapy/beta blockers decreased and diuretics now on hold to allow for increase renal perfusion. He still does not have acute indications for dialysis and will continue close monitoring-subtle uremic symptoms noted with dysgeusia.. 2. Non-ST elevation MI: With history of coronary artery disease/PAD, Ongoing optimize medical management by cardiology as he awaits for coronary angiogram after satisfactory renal recovery. 3. Hypertension: Blood pressures mainly on the lower side-agree with plans by cardiology to discontinue carvedilol at this time to help improve renal perfusion.  Subjective:   Denies any chest pain or shortness of breath-initially apprehensive about dialysis but now more open.    Objective:   BP 110/63 (BP Location: Right Arm)   Pulse 61   Temp 97.9 F (36.6 C) (Oral)   Resp 20   Ht 5' 7.99" (1.727 m)   Wt 81.4 kg (179 lb 7.3 oz)   SpO2 96%   BMI 27.29 kg/m   Intake/Output Summary (Last 24 hours) at 10/30/16 1039 Last data filed at 10/30/16 0945  Gross per 24 hour  Intake          1286.15 ml  Output              400 ml  Net           886.15 ml   Weight change: 2.202 kg (4 lb 13.7 oz)  Physical Exam: Gen: Resting in bed, somewhat uncomfortable with movements [id number]:UUVOZ regular rhythm, normal rate, 2/6 holosystolic murmur over apex Resp: Diminished breath sounds over bases because of poor inspiratory effort, no rhonchi/wheeze Abd: Soft, obese, nontender Ext: Status post  left above-knee amputation, right leg  Imaging: US Renal  Result Date: 10/28/2016 CLINICAL DATA:  82 year old male with acute renal failure. EXAM: RENAL / URINARY TRACT ULTRASOUND COMPLETE COMPARISON:  06/05/2016 and prior CTs FINDINGS: Right Kidney: Length: 7.6 cm with moderate cortical thinning. Echogenicity is increased. There is no evidence of solid mass or hydronephrosis. Multiple cysts are present. Left Kidney: Length: 12.6 cm. Echogenicity is upper limits of normal. No evidence of solid mass or hydronephrosis. Multiple cysts are present. Bladder: Appears normal for degree of bladder distention. IMPRESSION: 1. Small atrophic right kidney. 2. Upper limits of normal left renal echogenicity which may represent medical renal disease. 3. No evidence of hydronephrosis. Electronically Signed   By: Margarette Canada M.D.   On: 10/28/2016 20:32   Dg Chest Port 1 View  Result Date: 10/29/2016 CLINICAL DATA:  Pulmonary edema EXAM: PORTABLE CHEST 1 VIEW COMPARISON:  10/28/2016 FINDINGS: Left AICD remains in place, unchanged. Cardiomegaly. Scarring at the left base. Right lung is clear. No effusions or acute bony abnormality. IMPRESSION: Cardiomegaly.  Left basilar scarring.  No active disease. Electronically Signed   By: Rolm Baptise M.D.   On: 10/29/2016 18:33   Dg Chest Port 1 View  Result Date: 10/28/2016 CLINICAL DATA:  Shortness of breath. History of CHF, coronary artery disease, diabetes, current smoker. EXAM: PORTABLE CHEST 1 VIEW COMPARISON:  PA and lateral chest x-ray of October 27, 2016 and October 26, 2016. FINDINGS: The lungs are reasonably well  inflated. The cardiac silhouette remains enlarged. The pulmonary vascularity is prominent centrally but is less engorged today. There is calcification in the wall of the aortic arch. There is a small left pleural effusion. The ICD is in stable position. The bony thorax exhibits no acute abnormality. IMPRESSION: CHF with mild pulmonary vascular congestion which has  improved since the 26 October 2016 study. There remain coarse infrahilar lung markings on the right which may reflect atelectasis or pneumonia. Continued follow-up radiographs are recommended. Thoracic aortic atherosclerosis. Electronically Signed   By: David  Martinique M.D.   On: 10/28/2016 13:14    Labs: BMET  Recent Labs Lab 10/26/16 1400  10/27/16 0606  10/28/16 0704  10/28/16 1630 10/28/16 2042 10/29/16 0128 10/29/16 0729 10/29/16 1005 10/29/16 1523 10/30/16 0151  NA 137  --  136  --  137  --   --   --   --  137  --   --  135  K 5.3*  < > 6.7*  < > 4.6  4.6  < > 4.6 4.7 4.6 4.3 4.5 5.1 4.4  CL 108  --  106  --  105  --   --   --   --  106  --   --  105  CO2 20*  --  16*  --  17*  --   --   --   --  18*  --   --  18*  GLUCOSE 127*  --  137*  --  125*  --   --   --   --  105*  --   --  141*  BUN 55*  --  60*  --  82*  --   --   --   --  98*  --   --  113*  CREATININE 2.55*  --  2.98*  --  3.78*  --   --   --   --  4.36*  --   --  5.19*  CALCIUM 8.8*  --  9.0  --  8.5*  --   --   --   --  8.4*  --   --  8.0*  PHOS  --   --   --   --   --   --   --   --   --   --   --   --  6.5*  < > = values in this interval not displayed. CBC  Recent Labs Lab 10/27/16 0606 10/28/16 0704 10/29/16 0729 10/30/16 0151  WBC 16.6* 12.6* 12.7* 10.3  HGB 13.5 11.3* 11.4* 10.7*  HCT 41.7 34.0* 33.5* 32.4*  MCV 84.2 83.7 82.7 83.9  PLT 185 125* 124* 146*   Medications:    . aspirin EC  81 mg Oral Daily  . atorvastatin  80 mg Oral q1800  . finasteride  5 mg Oral Daily  . gabapentin  300 mg Oral QHS  . insulin aspart  0-15 Units Subcutaneous Q4H  . insulin glargine  8 Units Subcutaneous Daily  . sodium chloride flush  3 mL Intravenous Q12H   Elmarie Shiley, MD 10/30/2016, 10:39 AM

## 2016-10-30 NOTE — Progress Notes (Signed)
Progress Note  Patient Name: [name] Date of Encounter: 10/30/2016  Primary Cardiologist: Dr. Daneen Schick  Subjective   Denies dyspnea or chest pain  Inpatient Medications    Scheduled Meds: . aspirin EC  81 mg Oral Daily  . atorvastatin  80 mg Oral q1800  . enoxaparin (LOVENOX) injection  1 mg/kg Subcutaneous Q24H  . finasteride  5 mg Oral Daily  . gabapentin  300 mg Oral QHS  . insulin aspart  0-15 Units Subcutaneous Q4H  . insulin glargine  8 Units Subcutaneous Daily  . levalbuterol  1.25 mg Nebulization TID  . sodium chloride flush  3 mL Intravenous Q12H   Continuous Infusions:  PRN Meds: acetaminophen, furosemide, ipratropium-albuterol, morphine injection, nitroGLYCERIN, ondansetron (ZOFRAN) IV, oxyCODONE-acetaminophen **AND** oxyCODONE   Vital Signs    Vitals:   10/29/16 2200 10/29/16 2242 10/30/16 0500 10/30/16 0746  BP: (!) 94/55 (!) 99/56 90/62 110/63  Pulse: 66 62 62 68  Resp:    20  Temp:   98.1 F (36.7 C) 97.9 F (36.6 C)  TempSrc:   Oral Oral  SpO2: 96% 97% 96% 96%  Weight:   81.4 kg (179 lb 7.3 oz)   Height:        Intake/Output Summary (Last 24 hours) at 10/30/16 0826 Last data filed at 10/30/16 0700  Gross per 24 hour  Intake          1406.15 ml  Output              400 ml  Net          1006.15 ml   Filed Weights   10/28/16 0532 10/29/16 0603 10/30/16 0500  Weight: 80.1 kg (176 lb 8 oz) 79.2 kg (174 lb 9.6 oz) 81.4 kg (179 lb 7.3 oz)    Telemetry    V pacing with occasional PVC; failure to capture noted- Personally Reviewed   Physical Exam   GEN: WD/WN, NAD Neck: supple Cardiac: RRR Respiratory: CTA GI: Soft, NT/ND MS: no edema; s/p amputation Neuro: no focal findings   Labs    Chemistry Recent Labs Lab 10/28/16 0704  10/29/16 0729 10/29/16 1005 10/29/16 1523 10/30/16 0151  NA 137  --  137  --   --  135  K 4.6  4.6  < > 4.3 4.5 5.1 4.4  CL 105  --  106  --   --  105  CO2 17*  --  18*  --   --  18*    GLUCOSE 125*  --  105*  --   --  141*  BUN 82*  --  98*  --   --  113*  CREATININE 3.78*  --  4.36*  --   --  5.19*  CALCIUM 8.5*  --  8.4*  --   --  8.0*  PROT 5.8*  --   --   --   --   --   ALBUMIN 3.3*  --   --   --   --  3.5  AST 118*  --   --   --   --   --   ALT 33  --   --   --   --   --   ALKPHOS 45  --   --   --   --   --   BILITOT 2.4*  --   --   --   --   --   GFRNONAA 14*  --  12*  --   --  9*  GFRAA 16*  --  13*  --   --  11*  ANIONGAP 15  --  13  --   --  12  < > = values in this interval not displayed.   Hematology  Recent Labs Lab 10/28/16 0704 10/29/16 0729 10/30/16 0151  WBC 12.6* 12.7* 10.3  RBC 4.06* 4.05* 3.86*  HGB 11.3* 11.4* 10.7*  HCT 34.0* 33.5* 32.4*  MCV 83.7 82.7 83.9  MCH 27.8 28.1 27.7  MCHC 33.2 34.0 33.0  RDW 16.0* 16.1* 15.9*  PLT 125* 124* 146*    Cardiac Enzymes  Recent Labs Lab 10/26/16 1400 10/26/16 2230 10/27/16 0607 10/27/16 0808  TROPONINI 4.79* 2.62* 39.06* 46.73*    BNP  Recent Labs Lab 10/27/16 1413  BNP 1,249.1*     Radiology    US Renal  Result Date: 10/28/2016 CLINICAL DATA:  82 year old male with acute renal failure. EXAM: RENAL / URINARY TRACT ULTRASOUND COMPLETE COMPARISON:  06/05/2016 and prior CTs FINDINGS: Right Kidney: Length: 7.6 cm with moderate cortical thinning. Echogenicity is increased. There is no evidence of solid mass or hydronephrosis. Multiple cysts are present. Left Kidney: Length: 12.6 cm. Echogenicity is upper limits of normal. No evidence of solid mass or hydronephrosis. Multiple cysts are present. Bladder: Appears normal for degree of bladder distention. IMPRESSION: 1. Small atrophic right kidney. 2. Upper limits of normal left renal echogenicity which may represent medical renal disease. 3. No evidence of hydronephrosis. Electronically Signed   By: Margarette Canada M.D.   On: 10/28/2016 20:32   Dg Chest Port 1 View  Result Date: 10/29/2016 CLINICAL DATA:  Pulmonary edema EXAM: PORTABLE  CHEST 1 VIEW COMPARISON:  10/28/2016 FINDINGS: Left AICD remains in place, unchanged. Cardiomegaly. Scarring at the left base. Right lung is clear. No effusions or acute bony abnormality. IMPRESSION: Cardiomegaly.  Left basilar scarring.  No active disease. Electronically Signed   By: Rolm Baptise M.D.   On: 10/29/2016 18:33   Dg Chest Port 1 View  Result Date: 10/28/2016 CLINICAL DATA:  Shortness of breath. History of CHF, coronary artery disease, diabetes, current smoker. EXAM: PORTABLE CHEST 1 VIEW COMPARISON:  PA and lateral chest x-ray of October 27, 2016 and October 26, 2016. FINDINGS: The lungs are reasonably well inflated. The cardiac silhouette remains enlarged. The pulmonary vascularity is prominent centrally but is less engorged today. There is calcification in the wall of the aortic arch. There is a small left pleural effusion. The ICD is in stable position. The bony thorax exhibits no acute abnormality. IMPRESSION: CHF with mild pulmonary vascular congestion which has improved since the 26 October 2016 study. There remain coarse infrahilar lung markings on the right which may reflect atelectasis or pneumonia. Continued follow-up radiographs are recommended. Thoracic aortic atherosclerosis. Electronically Signed   By: David  Martinique M.D.   On: 10/28/2016 13:14    Cardiac Studies   2-D Doppler echocardiogram April 2016: Study Conclusions  - Left ventricle: The cavity size was normal. Wall thickness was increased in a pattern of mild LVH. Systolic function was vigorous. The estimated ejection fraction was in the range of 65% to 70%. - Aortic valve: There was trivial regurgitation. - Left atrium: The atrium was mildly dilated. - Right atrium: The atrium was mildly dilated.  Patient Profile     82 y.o. 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 Pt transferred to [facility] after presenting to OSH emergency department for evaluation of chest pain. Patient has ruled in for a myocardial infarction. His renal function has worsened since admission.   Assessment & Plan    1. CAD/NSTEMI: Patient remains pain-free. Continue aspirin, heparin, and statin. Blood pressure remains low. Continue off of beta blockade. Renal function continues to deteriorate. He states he would consider dialysis short-term not long-term. We therefore cannot proceed with cardiac catheterization due to risk of contrast nephropathy. He understands the risk of undiagnosed coronary disease including myocardial infarction and death. We'll continue to hold Coumadin.  2. AKI on CKD stage 3 - creatinine 2.98 this am (baseline 2) Renal function continues to deteriorate. Off all blood pressure medications to allow improved renal perfusion. Nephrology following. Would be at significant risk for contrast nephropathy if catheterization pursued. Will hold on further diuresis.  3. HTN: Blood pressure remains borderline. Continue to hold all medications.  4. Chronic afib with PPM - HR controlled with underlying V pacing - coumadin on hold - continue IV Heparin gtt  5. Pacemaker Possible failure to capture noted on telemetry. Will have device interrogated.   Signed, Kirk Ruths, MD  10/30/2016, 8:26 AM

## 2016-10-30 NOTE — Progress Notes (Signed)
Kayexalate ordered to give to patient now.  Patient refusing dose.  RN explained to patient about his potassium level and if potassium is too high or too low it can affect the way the heart beats.  RN explained to patient that his potassium level is increased from the previous result and the kayexalate will make him have a bowel movement and with the bowel movement patient will excrete some potassium to help keep the potassium level from getting to high.  Patient stated understanding and continued to refuse kayexalate dose.  [facility] paged to update.  Dr. Myna Hidalgo returned page and was updated, no new orders received at this time.

## 2016-10-31 ENCOUNTER — Inpatient Hospital Stay (HOSPITAL_COMMUNITY): Payer: PPO

## 2016-10-31 DIAGNOSIS — N189 Chronic kidney disease, unspecified: Secondary | ICD-10-CM

## 2016-10-31 DIAGNOSIS — I5043 Acute on chronic combined systolic (congestive) and diastolic (congestive) heart failure: Secondary | ICD-10-CM

## 2016-10-31 LAB — GLUCOSE, CAPILLARY
GLUCOSE-CAPILLARY: 114 mg/dL — AB (ref 65–99)
GLUCOSE-CAPILLARY: 131 mg/dL — AB (ref 65–99)
GLUCOSE-CAPILLARY: 99 mg/dL (ref 65–99)
Glucose-Capillary: 103 mg/dL — ABNORMAL HIGH (ref 65–99)
Glucose-Capillary: 90 mg/dL (ref 65–99)
Glucose-Capillary: 82 mg/dL (ref 65–99)
Glucose-Capillary: 82 mg/dL (ref 65–99)

## 2016-10-31 LAB — BLOOD GAS, VENOUS
Acid-base deficit: 6.6 mmol/L — ABNORMAL HIGH (ref 0.0–2.0)
Bicarbonate: 18.2 mmol/L — ABNORMAL LOW (ref 20.0–28.0)
DELIVERY SYSTEMS: POSITIVE
DRAWN BY: 511841
FIO2: 40
O2 SAT: 25.5 %
PATIENT TEMPERATURE: 98.6
PCO2 VEN: 36 mmHg — AB (ref 44.0–60.0)
pH, Ven: 7.326 (ref 7.250–7.430)

## 2016-10-31 LAB — CBC
HEMATOCRIT: 35.9 % — AB (ref 39.0–52.0)
Hemoglobin: 11.5 g/dL — ABNORMAL LOW (ref 13.0–17.0)
MCH: 26.9 pg (ref 26.0–34.0)
MCHC: 32 g/dL (ref 30.0–36.0)
MCV: 84.1 fL (ref 78.0–100.0)
PLATELETS: 247 10*3/uL (ref 150–400)
RBC: 4.27 MIL/uL (ref 4.22–5.81)
RDW: 16.1 % — ABNORMAL HIGH (ref 11.5–15.5)
WBC: 17.5 10*3/uL — AB (ref 4.0–10.5)

## 2016-10-31 LAB — RENAL FUNCTION PANEL
ALBUMIN: 3.5 g/dL (ref 3.5–5.0)
ANION GAP: 18 — AB (ref 5–15)
BUN: 125 mg/dL — AB (ref 6–20)
CALCIUM: 8.6 mg/dL — AB (ref 8.9–10.3)
CHLORIDE: 103 mmol/L (ref 101–111)
CO2: 14 mmol/L — ABNORMAL LOW (ref 22–32)
Creatinine, Ser: 5.85 mg/dL — ABNORMAL HIGH (ref 0.61–1.24)
GFR calc Af Amer: 9 mL/min — ABNORMAL LOW (ref >60)
GFR, EST NON AFRICAN AMERICAN: 8 mL/min — AB (ref >60)
Glucose, Bld: 104 mg/dL — ABNORMAL HIGH (ref 65–99)
PHOSPHORUS: 7.3 mg/dL — AB (ref 2.5–4.6)
POTASSIUM: 5.2 mmol/L — AB (ref 3.5–5.1)
Sodium: 135 mmol/L (ref 135–145)

## 2016-10-31 LAB — POTASSIUM: Potassium: 5.2 mmol/L — ABNORMAL HIGH (ref 3.5–5.1)

## 2016-10-31 LAB — MAGNESIUM: Magnesium: 2.3 mg/dL (ref 1.7–2.4)

## 2016-10-31 LAB — HEPARIN LEVEL (UNFRACTIONATED)
Heparin Unfractionated: 0.58 IU/mL (ref 0.30–0.70)
Heparin Unfractionated: 0.63 IU/mL (ref 0.30–0.70)

## 2016-10-31 MED ORDER — SODIUM CHLORIDE 0.9 % IV SOLN: 100.0000 mL | INTRAVENOUS | Status: DC | PRN

## 2016-10-31 MED ORDER — LIDOCAINE HCL (PF) 1 % IJ SOLN: 5.0000 mL | INTRAMUSCULAR | Status: DC | PRN

## 2016-10-31 MED ORDER — OXYCODONE HCL 5 MG PO TABS
ORAL_TABLET | ORAL | Status: AC
  Administered 2016-10-31: 5 mg via ORAL
  Filled 2016-10-31: qty 1

## 2016-10-31 MED ORDER — PENTAFLUOROPROP-TETRAFLUOROETH EX AERO: 1.0000 "application " | INHALATION_SPRAY | CUTANEOUS | Status: DC | PRN

## 2016-10-31 MED ORDER — LIDOCAINE-PRILOCAINE 2.5-2.5 % EX CREA
1.0000 "application " | TOPICAL_CREAM | CUTANEOUS | Status: DC | PRN
  Filled 2016-10-31: qty 5

## 2016-10-31 MED ORDER — ALTEPLASE 2 MG IJ SOLR: 2.0000 mg | Freq: Once | INTRAMUSCULAR | Status: DC | PRN

## 2016-10-31 MED ORDER — ACETAMINOPHEN 325 MG PO TABS
ORAL_TABLET | ORAL | Status: AC
  Filled 2016-10-31: qty 2

## 2016-10-31 MED ORDER — OXYCODONE-ACETAMINOPHEN 5-325 MG PO TABS
ORAL_TABLET | ORAL | Status: AC
  Administered 2016-10-31: 1 via ORAL
  Filled 2016-10-31: qty 1

## 2016-10-31 MED ORDER — HEPARIN SODIUM (PORCINE) 1000 UNIT/ML IJ SOLN
2.4000 mL | Freq: Once | INTRAMUSCULAR | Status: AC
Start: 2016-10-31 — End: 2016-10-31
  Administered 2016-10-31: 2400 [IU] via INTRAVENOUS
  Filled 2016-10-31 (×2): qty 2.4

## 2016-10-31 MED ORDER — HEPARIN SODIUM (PORCINE) 1000 UNIT/ML IJ SOLN
3000.0000 [IU] | Freq: Once | INTRAMUSCULAR | Status: DC
  Filled 2016-10-31: qty 3

## 2016-10-31 MED ORDER — HEPARIN SODIUM (PORCINE) 1000 UNIT/ML DIALYSIS
1000.0000 [IU] | INTRAMUSCULAR | Status: DC | PRN
  Filled 2016-10-31: qty 1

## 2016-10-31 MED ORDER — NALOXONE HCL 0.4 MG/ML IJ SOLN
0.4000 mg | INTRAMUSCULAR | Status: DC | PRN
  Administered 2016-10-31: 0.4 mg via INTRAVENOUS

## 2016-10-31 NOTE — Progress Notes (Signed)
Patient ID: [name], male   DOB: [birth year], 82 y.o.   MRN: [id number]  [facility] Progress Note   Assessment/ Plan:   1. Acute kidney injury on chronic kidney disease stage III/IV: Suspect multifactorial underlying chronic kidney disease likely from ischemic nephropathy given his history of RAS status post stenting and history of hypertension almost 10 years ago. With what appears to be hemodynamically mediated acute kidney injury that has continued to worsen with limited urine output and now worsening metabolic acidosis, hyperkalemia and volume overload- the latter being refractory to medical management. Plan to consult interventional radiology for placement of temporary dialysis catheter to begin hemodialysis today. 2. Non-ST elevation MI: With history of coronary artery disease/PAD, continue medical management per cardiology recommendations while deferring coronary angiogram at this time of acute kidney injury. 3. Hypertension:  Blood pressures remained low even after de-escalation of anti-hypertensive therapy-attempt cautious ultrafiltration with hemodialysis. Subjective:   Report some shortness of breath /early this morning.    Objective:   BP (!) 81/60 (BP Location: Left Arm)   Pulse 64   Temp 97.7 F (36.5 C) (Axillary)   Resp 20   Ht 5' 7.99" (1.727 m)   Wt 80.2 kg (176 lb 12.8 oz)   SpO2 94%   BMI 26.89 kg/m   Intake/Output Summary (Last 24 hours) at 10/31/16 1125 Last data filed at 10/31/16 0934  Gross per 24 hour  Intake              402 ml  Output              225 ml  Net              177 ml   Weight change: -1.204 kg (-2 lb 10.5 oz)  Physical Exam: Gen: Resting in bed, somewhat uncomfortable with movements ZWC:HENID regular rhythm, normal rate, 2/6 holosystolic murmur over apex Resp: Diminished breath sounds over bases because of poor inspiratory effort, no rhonchi/wheeze Abd: Soft, obese, nontender Ext: Status post left above-knee  amputation, right leg  Imaging: Dg Chest Port 1 View  Result Date: 10/29/2016 CLINICAL DATA:  Pulmonary edema EXAM: PORTABLE CHEST 1 VIEW COMPARISON:  10/28/2016 FINDINGS: Left AICD remains in place, unchanged. Cardiomegaly. Scarring at the left base. Right lung is clear. No effusions or acute bony abnormality. IMPRESSION: Cardiomegaly.  Left basilar scarring.  No active disease. Electronically Signed   By: Rolm Baptise M.D.   On: 10/29/2016 18:33    Labs: BMET  Recent Labs Lab 10/26/16 1400  10/27/16 0606  10/28/16 0704  10/29/16 0128 10/29/16 0729 10/29/16 1005 10/29/16 1523 10/30/16 0151 10/30/16 1601 10/31/16 0559  NA 137  --  136  --  137  --   --  137  --   --  135  --  135  K 5.3*  < > 6.7*  < > 4.6  4.6  < > 4.6 4.3 4.5 5.1 4.4 5.0 5.2*  CL 108  --  106  --  105  --   --  106  --   --  105  --  103  CO2 20*  --  16*  --  17*  --   --  18*  --   --  18*  --  14*  GLUCOSE 127*  --  137*  --  125*  --   --  105*  --   --  141*  --  104*  BUN 55*  --  60*  --  82*  --   --  98*  --   --  113*  --  125*  CREATININE 2.55*  --  2.98*  --  3.78*  --   --  4.36*  --   --  5.19*  --  5.85*  CALCIUM 8.8*  --  9.0  --  8.5*  --   --  8.4*  --   --  8.0*  --  8.6*  PHOS  --   --   --   --   --   --   --   --   --   --  6.5*  --  7.3*  < > = values in this interval not displayed. CBC  Recent Labs Lab 10/28/16 0704 10/29/16 0729 10/30/16 0151 10/31/16 0559  WBC 12.6* 12.7* 10.3 17.5*  HGB 11.3* 11.4* 10.7* 11.5*  HCT 34.0* 33.5* 32.4* 35.9*  MCV 83.7 82.7 83.9 84.1  PLT 125* 124* 146* 247   Medications:    . aspirin EC  81 mg Oral Daily  . atorvastatin  80 mg Oral q1800  . finasteride  5 mg Oral Daily  . gabapentin  300 mg Oral QHS  . insulin aspart  0-15 Units Subcutaneous Q4H  . insulin glargine  8 Units Subcutaneous Daily  . sodium chloride flush  3 mL Intravenous Q12H  . sodium polystyrene  30 g Oral Once   Elmarie Shiley, MD 10/31/2016, 11:25 AM

## 2016-10-31 NOTE — Progress Notes (Signed)
[place] TEAM 1 - Stepdown/ICU TEAM  [name]  EGB:[id number] DOB: [birth year] DOA: 10/26/2016 PCP: Laurey Morale, MD    Brief Narrative:  82 y.o. 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 Pt transferred to [facility] after presenting to [facility] for evaluation of chest pain. Patient ruled in for a myocardial infarction. His renal function has worsened since admission.   Subjective: Pt off floor when I attempted to visit him in his room.  All acute issues have been addressed by Cards and Nephrology today.    Assessment & Plan:  NSTEMI  Troponin peaked at ~46 - known CAD with stent to LAD - unable to have cath due to renal disease - cont medical management - no BB due to hypotension   Acute renal failure on CKD stage III baseline Cr ~2 - suspect ischemic nephropathy - Nephrology following - plans underway to begin HD   Recent Labs Lab 10/27/16 0606 10/28/16 0704 10/29/16 0729 10/30/16 0151 10/31/16 0559  CREATININE 2.98* 3.78* 4.36* 5.19* 5.85*    Chronic Diastolic CHF Volume control per HD  Filed Weights   10/29/16 0603 10/30/16 0500 10/31/16 0424  Weight: 79.2 kg (174 lb 9.6 oz) 81.4 kg (179 lb 7.3 oz) 80.2 kg (176 lb 12.8 oz)    Paroxysmal Atrial fibrillation CHADS-VASc is 6 - on coumadin prior to admit - cont heparin gtt for now   Hypotension in pt w/ hx HTN Avoiding meds which may lower BP - no fluid challenge due to kidney disease   Acute hypoxic respiratory failure  Due to NSTEMI, chronic diastolic CHF, acute kidney failure, hypotension - wean O2 as needed   OSA CPAP/BIPAP per respiratory  DM2 CBG currently well controlled   Hyperkalemia  Due to renal failure - follow   DVT prophylaxis: heparin gtt  Code Status: DNR - NO CODE Family Communication:     Disposition Plan:   Consultants:  Cardiology Nephrology Palliative Care    Procedures: 8/27 Echocardiogram:Left ventricle:  moderateconcentric hypertrophy. -LVEF = 65% to 70%.  -Grade 2 Diastolic dysfunction -Left atrium: moderately dilated. - Right ventricle: Pacer wire or catheter noted in right ventricle. Systolic function was normal. - Right atrium: moderately dilated. Pacer wire or catheter noted in right atrium. -Pulmonary arteries: PA peak pressure: 44 mm Hg (S).  Antimicrobials:  none   Objective: Blood pressure (!) 101/57, pulse (!) 58, temperature 97.7 F (36.5 C), temperature source Axillary, resp. rate 18, height 5' 7.99" (1.727 m), weight 80.2 kg (176 lb 12.8 oz), SpO2 96 %.  Intake/Output Summary (Last 24 hours) at 10/31/16 1714 Last data filed at 10/31/16 1402  Gross per 24 hour  Intake              342 ml  Output              150 ml  Net              192 ml   Filed Weights   10/29/16 0603 10/30/16 0500 10/31/16 0424  Weight: 79.2 kg (174 lb 9.6 oz) 81.4 kg (179 lb 7.3 oz) 80.2 kg (176 lb 12.8 oz)    Examination:   CBC:  Recent Labs Lab 10/27/16 0606 10/28/16 0704 10/29/16 0729 10/30/16 0151 10/31/16 0559  WBC 16.6* 12.6* 12.7* 10.3 17.5*  HGB 13.5 11.3* 11.4* 10.7* 11.5*  HCT 41.7 34.0* 33.5* 32.4* 35.9*  MCV 84.2 83.7 82.7 83.9 84.1  PLT 185 125* 124* 146* 810   Basic Metabolic Panel:  Recent Labs Lab 10/27/16 0606  10/28/16 0704  10/29/16 0729  10/29/16 1523 10/30/16 0151 10/30/16 1601 10/31/16 0559 10/31/16 1359  NA 136  --  137  --  137  --   --  135  --  135  --   K 6.7*  < > 4.6  4.6  < > 4.3  < > 5.1 4.4 5.0 5.2* 5.2*  CL 106  --  105  --  106  --   --  105  --  103  --   CO2 16*  --  17*  --  18*  --   --  18*  --  14*  --   GLUCOSE 137*  --  125*  --  105*  --   --  141*  --  104*  --   BUN 60*  --  82*  --  98*  --   --  113*  --  125*  --   CREATININE 2.98*  --  3.78*  --  4.36*  --   --  5.19*  --  5.85*  --    CALCIUM 9.0  --  8.5*  --  8.4*  --   --  8.0*  --  8.6*  --   MG  --   --  2.3  --  2.4  --   --  2.3  --  2.3  --   PHOS  --   --   --   --   --   --   --  6.5*  --  7.3*  --   < > = values in this interval not displayed. GFR: Estimated Creatinine Clearance: 9.7 mL/min (A) (by C-G formula based on SCr of 5.85 mg/dL (H)).  Liver Function Tests:  Recent Labs Lab 10/28/16 0704 10/30/16 0151 10/31/16 0559  AST 118*  --   --   ALT 33  --   --   ALKPHOS 45  --   --   BILITOT 2.4*  --   --   PROT 5.8*  --   --   ALBUMIN 3.3* 3.5 3.5    Coagulation Profile:  Recent Labs Lab 10/26/16 1400 10/27/16 0605  INR 1.67 1.90    Cardiac Enzymes:  Recent Labs Lab 10/26/16 1400 10/26/16 2230 10/27/16 0607 10/27/16 0808  TROPONINI 4.79* 2.62* 39.06* 46.73*    HbA1C: Hgb A1c MFr Bld  Date/Time Value Ref Range Status  09/13/2016 09:46 AM 7.0 (H) 4.6 - 6.5 % Final    Comment:    Glycemic Control Guidelines for People with Diabetes:Non Diabetic:  <6%Goal of Therapy: <7%Additional Action Suggested:  >8%   08/28/2015 08:52 AM 6.5 4.6 - 6.5 % Final    Comment:    Glycemic Control Guidelines for People with Diabetes:Non Diabetic:  <6%Goal of Therapy: <7%Additional Action Suggested:  >8%     CBG:  Recent Labs Lab 10/30/16 2344 10/31/16 0420 10/31/16 0729 10/31/16 1202 10/31/16 1655  GLUCAP 119* 103* 90 131* 114*    Recent Results (from the past 240 hour(s))  MRSA PCR Screening     Status: None   Collection Time: 10/26/16 10:47 PM  Result Value Ref Range Status   MRSA by PCR NEGATIVE NEGATIVE Final    Comment:        The GeneXpert MRSA Assay (  FDA approved for NASAL specimens only), is one component of a comprehensive MRSA colonization surveillance program. It is not intended to diagnose MRSA infection nor to guide or monitor treatment for MRSA infections.   Urine Culture     Status: None   Collection Time: 10/27/16  6:49 AM  Result Value Ref Range Status    Specimen Description URINE, RANDOM  Final   Special Requests NONE  Final   Culture NO GROWTH  Final   Report Status 10/28/2016 FINAL  Final  Culture, expectorated sputum-assessment     Status: None   Collection Time: 10/27/16  7:47 AM  Result Value Ref Range Status   Specimen Description SPUTUM  Final   Special Requests NONE  Final   Sputum evaluation   Final    Sputum specimen not acceptable for testing.  Please recollect.   Gram Stain Report Called to,Read Back By and Verified With: K HUTCHINS,RN AT 2440 10/27/16 BY L BENFIELD    Report Status 10/27/2016 FINAL  Final  Urine Culture     Status: None   Collection Time: 10/28/16 11:45 PM  Result Value Ref Range Status   Specimen Description URINE, CLEAN CATCH  Final   Special Requests NONE  Final   Culture NO GROWTH  Final   Report Status 10/30/2016 FINAL  Final     Scheduled Meds: . aspirin EC  81 mg Oral Daily  . atorvastatin  80 mg Oral q1800  . finasteride  5 mg Oral Daily  . gabapentin  300 mg Oral QHS  . heparin  3,000 Units Intravenous Once  . insulin aspart  0-15 Units Subcutaneous Q4H  . insulin glargine  8 Units Subcutaneous Daily  . sodium chloride flush  3 mL Intravenous Q12H  . sodium polystyrene  30 g Oral Once     LOS: 5 days   Cherene Altes, MD [facility] Office  [phone] Pager - Text Page per Amion as per below:  On-Call/Text Page:      [email or web address]      password [email or web address]  If 7PM-7AM, please contact night-coverage [email or web address] Password [email or web address] 10/31/2016, 5:14 PM

## 2016-10-31 NOTE — Procedures (Signed)
Hemodialysis Catheter Insertion Procedure Note KENNAN DETTER [id number] 1934-10-13  Procedure: Insertion of Hemodialysis Catheter Indications: Hemodialysis  Procedure Details Consent: Risks of procedure as well as the alternatives and risks of each were explained to the (patient/caregiver).  Consent for procedure obtained. Time Out: Verified patient identification, verified procedure, site/side was marked, verified correct patient position, special equipment/implants available, medications/allergies/relevent history reviewed, required imaging and test results available.  Performed  Maximum sterile technique was used including antiseptics, cap, gloves, gown, hand hygiene, mask and sheet. Skin prep: Chlorhexidine; local anesthetic administered A antimicrobial bonded/coated triple lumen catheter was placed in the right internal jugular vein using the Seldinger technique.  Evaluation Blood flow good Complications: No apparent complications Patient did tolerate procedure well. Chest X-ray ordered to verify placement.  CXR: pending.  Procedure performed under direct ultrasound guidance for real time vessel cannulation.      Montey Hora, [facility] Pgr: [phone]  or [phone] 10/31/2016, 5:49 PM

## 2016-10-31 NOTE — Progress Notes (Signed)
RT NOTE:  Pt does not wear CPAP. Pt did wear CPAP in the past but had a surgery (pt does not know name) and surgery helped OSA. Pt now only wear O2 when he feels like he needs to.

## 2016-10-31 NOTE — Progress Notes (Signed)
Dialysis treatment completed.  2000 mL ultrafiltrated.  1500 mL net fluid removal.  Patient status unchanged. Lung sounds diminsihed to ausculation in all fields. Generalized  edema. Cardiac: NSR.  Cleansed RIJ catheter with chlorhexidine.  Disconnected lines and flushed ports with saline per protocol.  Ports locked with heparin and capped per protocol.    Report given to bedside, RN Christine.

## 2016-10-31 NOTE — Care Management Note (Signed)
Case Management Note  Patient Details  Name: AZELL BILL MRN: [id number] Date of Birth: 1934/04/30  Subjective/Objective:   Pt presented for Nstemi- AKIo CKD- Cr up to 5.19 and unable to proceed with Cardiac Cath until renal function recovers. Nephrology is following. Palliative Care Consulted- now a DNR.                  Action/Plan: CM will continue to monitor for disposition needs.   Expected Discharge Date:                  Expected Discharge Plan:     In-House Referral:  Clinical Social Work  Discharge planning Services  CM Consult  Post Acute Care Choice:    Choice offered to:     DME Arranged:    DME Agency:     HH Arranged:    HH Agency:     Status of Service:  In process, will continue to follow  If discussed at Long Length of Stay Meetings, dates discussed:  10-31-16  Additional Comments:  Bethena Roys, RN 10/31/2016, 4:44 PM

## 2016-10-31 NOTE — Progress Notes (Signed)
ANTICOAGULATION CONSULT NOTE - Follow Up Consult  Pharmacy Consult for Heparin Indication: NSTEMI, afib  Allergies  Allergen Reactions  . Other Other (See Comments)    Plastic tape tears skin  . Bactrim Ds [Sulfamethoxazole-Trimethoprim] Other (See Comments)    Hand tremors    Patient Measurements: Height: 5' 7.99" (172.7 cm) Weight: 176 lb 12.8 oz (80.2 kg) IBW/kg (Calculated) : 68.38 Heparin Dosing Weight:    Vital Signs: Temp: 97.7 F (36.5 C) (08/30 0736) Temp Source: Axillary (08/30 0736) BP: 81/60 (08/30 0736) Pulse Rate: 64 (08/30 0736)  Labs:  Recent Labs  10/29/16 0729 10/30/16 0151 10/31/16 0559 10/31/16 1359  HGB 11.4* 10.7* 11.5*  --   HCT 33.5* 32.4* 35.9*  --   PLT 124* 146* 247  --   HEPARINUNFRC 0.50  --  0.58 0.63  CREATININE 4.36* 5.19* 5.85*  --     Estimated Creatinine Clearance: 9.7 mL/min (A) (by C-G formula based on SCr of 5.85 mg/dL (H)).   Assessment: Anticoag: CP on heparin, trop 2.62> 46.73. BNP elevated.  Warfarin PTA on hold (PTA warfarin dose: 2.5 mg daily)> possible cath if renal fxn improves or willing to re-consider dialysis  - AM HL 0.58, repeat 0.63 remains in goal range. Hgb stable Plts up  Goal of Therapy:  Heparin level 0.3-0.7 units/ml Monitor platelets by anticoagulation protocol: Yes   Plan:  Continue IV heparin at 1100 units/hr Monitor daily CBC and HL Coumadin on hold  [name], PharmD, BCPS Clinical Staff Pharmacist Pager [phone]  [name] 10/31/2016,3:24 PM

## 2016-10-31 NOTE — Progress Notes (Signed)
Progress Note  Patient Name: [name] Date of Encounter: 10/31/2016  Primary Cardiologist: Dr. Daneen Schick  Subjective   Denies dyspnea or chest pain; appears dyspneic during evaluation  Inpatient Medications    Scheduled Meds: . aspirin EC  81 mg Oral Daily  . atorvastatin  80 mg Oral q1800  . finasteride  5 mg Oral Daily  . gabapentin  300 mg Oral QHS  . insulin aspart  0-15 Units Subcutaneous Q4H  . insulin glargine  8 Units Subcutaneous Daily  . sodium chloride flush  3 mL Intravenous Q12H  . sodium polystyrene  30 g Oral Once   Continuous Infusions: . heparin 1,100 Units/hr (10/30/16 2200)   PRN Meds: acetaminophen, ipratropium-albuterol, morphine injection, nitroGLYCERIN, ondansetron (ZOFRAN) IV, oxyCODONE-acetaminophen **AND** oxyCODONE   Vital Signs    Vitals:   10/30/16 2007 10/30/16 2347 10/31/16 0424 10/31/16 0736  BP: 98/64 (!) 107/59 (!) 94/50 (!) 81/60  Pulse: 95 63 (!) 58 64  Resp:  18 20   Temp: 98.5 F (36.9 C) 97.7 F (36.5 C) 97.6 F (36.4 C) 97.7 F (36.5 C)  TempSrc: Axillary Axillary Axillary Axillary  SpO2: 95% 93% 94% 94%  Weight:   80.2 kg (176 lb 12.8 oz)   Height:        Intake/Output Summary (Last 24 hours) at 10/31/16 0850 Last data filed at 10/31/16 0700  Gross per 24 hour  Intake              522 ml  Output              225 ml  Net              297 ml   Filed Weights   10/29/16 0603 10/30/16 0500 10/31/16 0424  Weight: 79.2 kg (174 lb 9.6 oz) 81.4 kg (179 lb 7.3 oz) 80.2 kg (176 lb 12.8 oz)    Telemetry    V pacing with occasional PVC; possible failure to sense noted- Personally Reviewed   Physical Exam   GEN: WD/WN, NAD, mildly dyspneic Neck: supple, JVP 10 Cardiac: RRR, 2/6 systolic murmur apex Respiratory: diminished BS bases GI: Soft, NT/ND, no masses palpated. MS: no edema; chronic skin changes; s/p amputation Neuro: grossly intact   Labs    Chemistry Recent Labs Lab 10/28/16 0704   10/29/16 0729  10/30/16 0151 10/30/16 1601 10/31/16 0559  NA 137  --  137  --  135  --  135  K 4.6  4.6  < > 4.3  < > 4.4 5.0 5.2*  CL 105  --  106  --  105  --  103  CO2 17*  --  18*  --  18*  --  14*  GLUCOSE 125*  --  105*  --  141*  --  104*  BUN 82*  --  98*  --  113*  --  125*  CREATININE 3.78*  --  4.36*  --  5.19*  --  5.85*  CALCIUM 8.5*  --  8.4*  --  8.0*  --  8.6*  PROT 5.8*  --   --   --   --   --   --   ALBUMIN 3.3*  --   --   --  3.5  --  3.5  AST 118*  --   --   --   --   --   --   ALT 33  --   --   --   --   --   --  ALKPHOS 45  --   --   --   --   --   --   BILITOT 2.4*  --   --   --   --   --   --   GFRNONAA 14*  --  12*  --  9*  --  8*  GFRAA 16*  --  13*  --  11*  --  9*  ANIONGAP 15  --  13  --  12  --  18*  < > = values in this interval not displayed.   Hematology  Recent Labs Lab 10/29/16 0729 10/30/16 0151 10/31/16 0559  WBC 12.7* 10.3 17.5*  RBC 4.05* 3.86* 4.27  HGB 11.4* 10.7* 11.5*  HCT 33.5* 32.4* 35.9*  MCV 82.7 83.9 84.1  MCH 28.1 27.7 26.9  MCHC 34.0 33.0 32.0  RDW 16.1* 15.9* 16.1*  PLT 124* 146* 247    Cardiac Enzymes  Recent Labs Lab 10/26/16 1400 10/26/16 2230 10/27/16 0607 10/27/16 0808  TROPONINI 4.79* 2.62* 39.06* 46.73*    BNP  Recent Labs Lab 10/27/16 1413  BNP 1,249.1*     Radiology    Dg Chest Port 1 View  Result Date: 10/29/2016 CLINICAL DATA:  Pulmonary edema EXAM: PORTABLE CHEST 1 VIEW COMPARISON:  10/28/2016 FINDINGS: Left AICD remains in place, unchanged. Cardiomegaly. Scarring at the left base. Right lung is clear. No effusions or acute bony abnormality. IMPRESSION: Cardiomegaly.  Left basilar scarring.  No active disease. Electronically Signed   By: Rolm Baptise M.D.   On: 10/29/2016 18:33    Cardiac Studies   2-D Doppler echocardiogram April 2016: Study Conclusions  - Left ventricle: The cavity size was normal. Wall thickness was increased in a pattern of mild LVH. Systolic function  was vigorous. The estimated ejection fraction was in the range of 65% to 70%. - Aortic valve: There was trivial regurgitation. - Left atrium: The atrium was mildly dilated. - Right atrium: The atrium was mildly dilated.  Patient Profile     82 y.o. 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 Pt transferred to [facility] after presenting to OSH emergency department for evaluation of chest pain. Patient has ruled in for a myocardial infarction. His renal function has worsened since admission.   Assessment & Plan    1. CAD/NSTEMI: Patient remains pain-free. LV function normal on echo; grade 2 DD; moderate AI; mild MR. Continue aspirin, heparin, and statin. Blood pressure remains low. Continue off of beta blockade. Renal function continues to deteriorate. He states he would consider dialysis short-term but not long-term. We therefore cannot proceed with cardiac catheterization due to risk of contrast nephropathy. He understands the risk of undiagnosed coronary disease including myocardial infarction and death. We'll continue to hold Coumadin. If his renal function improves or he is agreeable to long term dialysis, can proceed with cath at that time.  2. AKI on CKD stage 3 - creatinine 2.98 this am (baseline 2) Renal function continues to deteriorate. Off all blood pressure medications to allow improved renal perfusion. Nephrology following. Would be at significant risk for contrast nephropathy if catheterization pursued. May need to initiate dialysis as pt appears volume overloaded on exam.  3. HTN: Blood pressure remains borderline. Continue to hold all medications.  4. Chronic afib with PPM - HR controlled with underlying V pacing - coumadin on hold -  continue IV Heparin gtt  5. Pacemaker Possible  failure to capture and failure to sense noted on telemetry. Will have device interrogated.   Signed, Kirk Ruths, MD  10/31/2016, 8:50 AM

## 2016-10-31 NOTE — Progress Notes (Signed)
No charge note  Patient seen and examined.  Spoke with son, [name] on the phone and scheduled a PMT/family meeting - tomorrow,  8/31 at 10 am.  Florentina Jenny, PA-C Palliative Medicine Pager: [phone]

## 2016-10-31 NOTE — Progress Notes (Signed)
ANTICOAGULATION CONSULT NOTE - Follow Up Consult  Pharmacy Consult for heparin Indication: atrial fibrillation and NSTEMI  Labs:  Recent Labs  10/28/16 2042  10/29/16 0729 10/30/16 0151 10/31/16 0559  HGB  --   < > 11.4* 10.7* 11.5*  HCT  --   --  33.5* 32.4* 35.9*  PLT  --   --  124* 146* 247  HEPARINUNFRC 0.45  --  0.50  --  0.58  CREATININE  --   --  4.36* 5.19*  --   < > = values in this interval not displayed.   Assessment/Plan:  82yo male therapeutic on heparin with transition from [place] to UFH. Will continue gtt at current rate and confirm stable with additional level.   Wynona Neat, PharmD, BCPS  10/31/2016,7:24 AM

## 2016-11-01 ENCOUNTER — Inpatient Hospital Stay (HOSPITAL_COMMUNITY): Payer: PPO

## 2016-11-01 DIAGNOSIS — Z7189 Other specified counseling: Secondary | ICD-10-CM

## 2016-11-01 DIAGNOSIS — Z515 Encounter for palliative care: Secondary | ICD-10-CM

## 2016-11-01 LAB — GLUCOSE, CAPILLARY
GLUCOSE-CAPILLARY: 152 mg/dL — AB (ref 65–99)
GLUCOSE-CAPILLARY: 159 mg/dL — AB (ref 65–99)
GLUCOSE-CAPILLARY: 221 mg/dL — AB (ref 65–99)
Glucose-Capillary: 179 mg/dL — ABNORMAL HIGH (ref 65–99)
Glucose-Capillary: 205 mg/dL — ABNORMAL HIGH (ref 65–99)

## 2016-11-01 LAB — RENAL FUNCTION PANEL
ALBUMIN: 3.5 g/dL (ref 3.5–5.0)
Anion gap: 18 — ABNORMAL HIGH (ref 5–15)
BUN: 96 mg/dL — AB (ref 6–20)
CO2: 17 mmol/L — ABNORMAL LOW (ref 22–32)
CREATININE: 5.62 mg/dL — AB (ref 0.61–1.24)
Calcium: 8.3 mg/dL — ABNORMAL LOW (ref 8.9–10.3)
Chloride: 101 mmol/L (ref 101–111)
GFR calc Af Amer: 10 mL/min — ABNORMAL LOW (ref >60)
GFR, EST NON AFRICAN AMERICAN: 9 mL/min — AB (ref >60)
Glucose, Bld: 133 mg/dL — ABNORMAL HIGH (ref 65–99)
PHOSPHORUS: 7.2 mg/dL — AB (ref 2.5–4.6)
POTASSIUM: 4.7 mmol/L (ref 3.5–5.1)
Sodium: 136 mmol/L (ref 135–145)

## 2016-11-01 LAB — BLOOD GAS, ARTERIAL
Acid-base deficit: 9.2 mmol/L — ABNORMAL HIGH (ref 0.0–2.0)
Bicarbonate: 14.8 mmol/L — ABNORMAL LOW (ref 20.0–28.0)
Delivery systems: POSITIVE
Drawn by: 275531
FIO2: 50
O2 SAT: 97.4 %
PATIENT TEMPERATURE: 98.6
PCO2 ART: 25 mmHg — AB (ref 32.0–48.0)
PEEP/CPAP: 6 cmH2O
PIP: 14 cmH2O
PO2 ART: 110 mmHg — AB (ref 83.0–108.0)
RATE: 8 resp/min
pH, Arterial: 7.391 (ref 7.350–7.450)

## 2016-11-01 LAB — HEPARIN LEVEL (UNFRACTIONATED): Heparin Unfractionated: <0.1 IU/mL — ABNORMAL LOW (ref 0.30–0.70)

## 2016-11-01 LAB — CBC
HCT: 32.9 % — ABNORMAL LOW (ref 39.0–52.0)
Hemoglobin: 10.7 g/dL — ABNORMAL LOW (ref 13.0–17.0)
MCH: 27.2 pg (ref 26.0–34.0)
MCHC: 32.5 g/dL (ref 30.0–36.0)
MCV: 83.5 fL (ref 78.0–100.0)
PLATELETS: 164 10*3/uL (ref 150–400)
RBC: 3.94 MIL/uL — AB (ref 4.22–5.81)
RDW: 16 % — ABNORMAL HIGH (ref 11.5–15.5)
WBC: 14.9 10*3/uL — ABNORMAL HIGH (ref 4.0–10.5)

## 2016-11-01 LAB — HEPATITIS B CORE ANTIBODY, TOTAL: Hep B Core Total Ab: NEGATIVE

## 2016-11-01 LAB — HEPATITIS B SURFACE ANTIGEN: Hepatitis B Surface Ag: NEGATIVE

## 2016-11-01 LAB — HEPATITIS B SURFACE ANTIBODY,QUALITATIVE: HEP B S AB: NONREACTIVE

## 2016-11-01 LAB — POTASSIUM: Potassium: 5.1 mmol/L (ref 3.5–5.1)

## 2016-11-01 LAB — MAGNESIUM: Magnesium: 2.3 mg/dL (ref 1.7–2.4)

## 2016-11-01 MED ORDER — INSULIN ASPART 100 UNIT/ML ~~LOC~~ SOLN
0.0000 [IU] | Freq: Every day | SUBCUTANEOUS | Status: DC
  Administered 2016-11-01: 2 [IU] via SUBCUTANEOUS

## 2016-11-01 MED ORDER — FENTANYL CITRATE (PF) 100 MCG/2ML IJ SOLN: 12.5000 ug | INTRAMUSCULAR | Status: DC | PRN

## 2016-11-01 MED ORDER — INSULIN ASPART 100 UNIT/ML ~~LOC~~ SOLN
0.0000 [IU] | Freq: Three times a day (TID) | SUBCUTANEOUS | Status: DC
  Administered 2016-11-01: 5 [IU] via SUBCUTANEOUS
  Administered 2016-11-02 (×2): 3 [IU] via SUBCUTANEOUS

## 2016-11-01 MED ORDER — HEPARIN SODIUM (PORCINE) 1000 UNIT/ML IJ SOLN
2000.0000 [IU] | Freq: Once | INTRAMUSCULAR | Status: DC
  Filled 2016-11-01: qty 2

## 2016-11-01 MED ORDER — LORAZEPAM 2 MG/ML IJ SOLN
0.5000 mg | INTRAMUSCULAR | Status: DC | PRN
  Administered 2016-11-01 – 2016-11-03 (×2): 0.5 mg via INTRAVENOUS
  Filled 2016-11-01 (×2): qty 1

## 2016-11-01 MED ORDER — HEPARIN (PORCINE) IN NACL 100-0.45 UNIT/ML-% IJ SOLN: 1100.0000 [IU]/h | INTRAMUSCULAR | Status: DC

## 2016-11-01 NOTE — Care Management Important Message (Signed)
Important Message  Patient Details  Name: [name] MRN: [id number] Date of Birth: November 21, 1934   Medicare Important Message Given:  Yes    Nathen May 11/01/2016, 9:19 AM

## 2016-11-01 NOTE — Progress Notes (Signed)
[place] TEAM 1 - Stepdown/ICU TEAM  KRISHAY FARO  IPJ:[id number] DOB: 16-Jan-1935 DOA: 10/26/2016 PCP: Laurey Morale, MD    Brief Narrative:  82 y.o. 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 Pt transferred to [facility] after presenting to [facility] for evaluation of chest pain. Patient ruled in for a myocardial infarction. His renal function has worsened since admission.   Subjective: Called the the pt's room this AM due to obtundation.  BIPAP trial had been initiated, and pt appeared to be slowly responding to this.  He was not abel to provide a hx at the time of my visit, but did appear comfortably and not in extremis.    Assessment & Plan:  NSTEMI  Troponin peaked at ~46 - known CAD with stent to LAD - unable to have cath due to renal disease - cont medical management - no BB due to hypotension   Acute renal failure on CKD stage III baseline Cr ~2 - suspect ischemic nephropathy - Nephrology following - cont short term HD for now to see if it assists him in recovering renal fxn/volume balance - if not, pt is not interested in long term HD per [facility] and family   Recent Labs Lab 10/28/16 0704 10/29/16 0729 10/30/16 0151 10/31/16 0559 11/01/16 0226  CREATININE 3.78* 4.36* 5.19* 5.85* 5.62*    Chronic Diastolic CHF Volume control per HD - weight on downward trend - no gross overload on exam Filed Weights   10/31/16 0424 10/31/16 1818 10/31/16 2048  Weight: 80.2 kg (176 lb 12.8 oz) 80.8 kg (178 lb 2.1 oz) 79.3 kg (174 lb 13.2 oz)    Paroxysmal Atrial fibrillation CHADS-VASc is 6 - on coumadin prior to admit - resume heparin for now   Hypotension in pt w/ hx HTN Avoiding meds which may lower BP - no fluid challenge due to kidney disease   Acute hypoxic respiratory failure  Due to  NSTEMI, chronic diastolic CHF, acute kidney failure, hypotension - likely worsened by sedation of uremia as well - intermittent prn BIPAP while BUN being addressed by HD   OSA CPAP/BIPAP per respiratory  DM2 CBG currently reasonably controlled   Hyperkalemia  Due to renal failure - corrected w/ HD   DVT prophylaxis: heparin gtt  Code Status: DNR - NO CODE Family Communication:  PC met w/ family this morning - no family present at time of my exam  Disposition Plan: SDU - cont current level of care but will not escalate further if pt declines acutely   Consultants:  Cardiology Nephrology Palliative Care    Procedures: 8/27 TTE - EF 65-70% - grade 2 DD  Antimicrobials:  none   Objective: Blood pressure (!) 108/52, pulse 64, temperature 98.2 F (36.8 C), temperature source Oral, resp. rate 15, height 5' 7.99" (1.727 m), weight 79.3 kg (174 lb 13.2 oz), SpO2 97 %.  Intake/Output Summary (Last 24 hours) at 11/01/16 1216 Last data filed at 10/31/16 2155  Gross per 24 hour  Intake                3 ml  Output             1600 ml  Net            -1597 ml   Filed Weights   10/31/16 0424 10/31/16 1818 10/31/16 2048  Weight: 80.2 kg (176 lb 12.8 oz) 80.8 kg (178 lb 2.1 oz) 79.3 kg (174 lb 13.2 oz)    Examination: General: sedate/obtunded - no evidence of signif acute distress otherwise  Lungs: fine distant crackles th/o - no wheezing  Cardiovascular: RRR - no R or M  Abdomen: Nondistended, soft, bowel sounds positive but hypo, no rebound Extremities: trace LE edema    CBC:  Recent Labs Lab 10/28/16 0704 10/29/16 0729 10/30/16 0151 10/31/16 0559 11/01/16 0226  WBC 12.6* 12.7* 10.3 17.5* 14.9*  HGB 11.3* 11.4* 10.7* 11.5* 10.7*  HCT 34.0* 33.5* 32.4* 35.9* 32.9*  MCV 83.7 82.7 83.9 84.1 83.5  PLT 125* 124* 146* 247 409   Basic Metabolic Panel:  Recent Labs Lab 10/28/16 0704  10/29/16 0729  10/30/16 0151 10/30/16 1601 10/31/16 0559 10/31/16 1359  11/01/16 0226  NA 137  --  137  --  135  --  135  --  136  K 4.6  4.6  < > 4.3  < > 4.4 5.0 5.2* 5.2* 4.7  CL 105  --  106  --  105  --  103  --  101  CO2 17*  --  18*  --  18*  --  14*  --  17*  GLUCOSE 125*  --  105*  --  141*  --  104*  --  133*  BUN 82*  --  98*  --  113*  --  125*  --  96*  CREATININE 3.78*  --  4.36*  --  5.19*  --  5.85*  --  5.62*  CALCIUM 8.5*  --  8.4*  --  8.0*  --  8.6*  --  8.3*  MG 2.3  --  2.4  --  2.3  --  2.3  --  2.3  PHOS  --   --   --   --  6.5*  --  7.3*  --  7.2*  < > = values in this interval not displayed. GFR: Estimated Creatinine Clearance: 10.1 mL/min (A) (by C-G formula based on SCr of 5.62 mg/dL (H)).  Liver Function Tests:  Recent Labs Lab 10/28/16 0704 10/30/16 0151 10/31/16 0559 11/01/16 0226  AST 118*  --   --   --   ALT 33  --   --   --   ALKPHOS 45  --   --   --   BILITOT 2.4*  --   --   --   PROT 5.8*  --   --   --   ALBUMIN 3.3* 3.5 3.5 3.5    Coagulation Profile:  Recent Labs Lab 10/26/16 1400 10/27/16 0605  INR 1.67 1.90    Cardiac Enzymes:  Recent Labs Lab 10/26/16 1400 10/26/16 2230 10/27/16 0607 10/27/16 0808  TROPONINI 4.79* 2.62* 39.06* 46.73*    HbA1C: Hgb A1c MFr Bld  Date/Time Value Ref Range Status  09/13/2016 09:46 AM 7.0 (H) 4.6 - 6.5 % Final    Comment:    Glycemic Control Guidelines for People with Diabetes:Non Diabetic:  <6%Goal of Therapy: <7%Additional Action Suggested:  >8%   08/28/2015 08:52 AM 6.5 4.6 - 6.5 % Final    Comment:    Glycemic Control Guidelines for People with Diabetes:Non Diabetic:  <6%Goal of Therapy: <7%Additional Action Suggested:  >8%     CBG:  Recent Labs Lab 10/31/16 2325 10/31/16 2346 11/01/16 0449 11/01/16 0738 11/01/16 1131  GLUCAP 82 99 152* 159* 179*    Recent Results (  from the past 240 hour(s))  MRSA PCR Screening     Status: None   Collection Time: 10/26/16 10:47 PM  Result Value Ref Range Status   MRSA by PCR NEGATIVE NEGATIVE Final     Comment:        The GeneXpert MRSA Assay (FDA approved for NASAL specimens only), is one component of a comprehensive MRSA colonization surveillance program. It is not intended to diagnose MRSA infection nor to guide or monitor treatment for MRSA infections.   Urine Culture     Status: None   Collection Time: 10/27/16  6:49 AM  Result Value Ref Range Status   Specimen Description URINE, RANDOM  Final   Special Requests NONE  Final   Culture NO GROWTH  Final   Report Status 10/28/2016 FINAL  Final  Culture, expectorated sputum-assessment     Status: None   Collection Time: 10/27/16  7:47 AM  Result Value Ref Range Status   Specimen Description SPUTUM  Final   Special Requests NONE  Final   Sputum evaluation   Final    Sputum specimen not acceptable for testing.  Please recollect.   Gram Stain Report Called to,Read Back By and Verified With: K HUTCHINS,RN AT 5456 10/27/16 BY L BENFIELD    Report Status 10/27/2016 FINAL  Final  Urine Culture     Status: None   Collection Time: 10/28/16 11:45 PM  Result Value Ref Range Status   Specimen Description URINE, CLEAN CATCH  Final   Special Requests NONE  Final   Culture NO GROWTH  Final   Report Status 10/30/2016 FINAL  Final     Scheduled Meds: . aspirin EC  81 mg Oral Daily  . atorvastatin  80 mg Oral q1800  . finasteride  5 mg Oral Daily  . gabapentin  300 mg Oral QHS  . insulin aspart  0-15 Units Subcutaneous Q4H  . insulin glargine  8 Units Subcutaneous Daily  . sodium chloride flush  3 mL Intravenous Q12H     LOS: 6 days   Cherene Altes, MD [facility] Office  [phone] Pager - Text Page per Amion as per below:  On-Call/Text Page:      [email or web address]      password [email or web address]  If 7PM-7AM, please contact night-coverage [email or web address] Password [email or web address] 11/01/2016, 12:16 PM

## 2016-11-01 NOTE — Progress Notes (Signed)
Spoke with RN.  Patient had temp cath placed yesterday evening by CCM for HD overnight.  Order for IR to place temp cath cancelled.    Brynda Greathouse, MS RD PA-C

## 2016-11-01 NOTE — Progress Notes (Signed)
Patient ID: [name], male   DOB: [birth year], 82 y.o.   MRN: [id number]  [facility] Progress Note   Assessment/ Plan:   1. Acute kidney injury on chronic kidney disease stage III/IV: With underlying chronic kidney disease that is likely ischemic nephropathy (history of RAS/HTN) and now what appears to be acute kidney injury from hemodynamic mechanism status post non-STEMI. Underwent placement of an pre-dialysis catheter yesterday by critical care following which he had hemodialysis with ultrafiltration of 1.5 L. Chest x-ray continues to show evidence of volume overload and he continues to have respiratory distress for which additional hemodialysis/ultrafiltration will be undertaken today. 2. Non-ST elevation MI: With history of coronary artery disease/PAD, continue medical management per cardiology recommendations while deferring coronary angiogram at this time of acute kidney injury. 3. Hypotension:  Blood pressures remain "soft" even after de-escalation of anti-hypertensive therapy as well as discontinuation of diuretics. Suspect this is in part from recent non-STEMI. Will continue efforts at hemodialysis recognizing well that aggressive ultrafiltration could worsen myocardial stunning. 4. Respiratory distress: Currently on support with noninvasive positive pressure ventilation-arterial blood gas pending. Hemodialysis later today for ultrafiltration. He remains DO NOT RESUSCITATE but unclear if also DO NOT INTUBATE.   Subjective:   Underwent hemodialysis yesterday night following which he had hypotension and developed respiratory distress requiring BiPAP transiently. Earlier this morning, increasingly somnolent and unresponsive for which BiPAP was restarted.    Objective:   BP (!) 118/50   Pulse 60   Temp 98.4 F (36.9 C) (Axillary)   Resp (!) 30   Ht 5' 7.99" (1.727 m)   Wt 79.3 kg (174 lb 13.2 oz)   SpO2 100%   BMI 26.59 kg/m   Intake/Output Summary (Last 24 hours)  at 11/01/16 0830 Last data filed at 10/31/16 2155  Gross per 24 hour  Intake              123 ml  Output             1600 ml  Net            -1477 ml   Weight change: 0.604 kg (1 lb 5.3 oz)  Physical Exam: Gen: Appears to be somewhat uncomfortable resting on BiPAP WCB:JSEGB regular rhythm, normal rate, 2/6 holosystolic murmur over apex Resp: Coarse breath sounds with fine rales left base Abd: Soft, obese, nontender Ext: Status post left above-knee amputation, right leg with trace-1+ edema  Imaging: Dg Chest Port 1 View  Result Date: 11/01/2016 CLINICAL DATA:  Respiratory distress EXAM: PORTABLE CHEST 1 VIEW COMPARISON:  10/31/2016 FINDINGS: Left-sided pacing device as before. Right-sided central venous catheter tip projects over the brachiocephalic confluence. Slight worsening of consolidation at the left base. Otherwise stable small pleural effusions. Cardiomegaly with atherosclerosis. Slight increased central vascular congestion. No pneumothorax. IMPRESSION: 1. Slight interval increase in left lung base consolidation 2. Cardiomegaly with central vascular congestion and mild perihilar edema. Stable appearance of small bilateral effusions. Electronically Signed   By: Donavan Foil M.D.   On: 11/01/2016 00:43   Dg Chest Port 1 View  Result Date: 10/31/2016 CLINICAL DATA:  Central line placement EXAM: PORTABLE CHEST 1 VIEW COMPARISON:  10/29/2016, 03/22/2016 FINDINGS: Left-sided multi lead pacing device with similar appearance of right atrial lead tip directed inferior. Right-sided central venous catheter has been placed, the tip overlies the brachiocephalic confluence. There is no pneumothorax. Cardiomegaly with small pleural effusions and left basilar atelectasis or pneumonia. Atherosclerosis. IMPRESSION: 1. Right-sided central venous catheter tip  superimposes the brachiocephalic confluence. Negative for pneumothorax 2. Small bilateral pleural effusions with left basilar atelectasis or  pneumonia 3. Cardiomegaly Electronically Signed   By: Donavan Foil M.D.   On: 10/31/2016 18:52    Labs: BMET  Recent Labs Lab 10/26/16 1400  10/27/16 0606  10/28/16 0704  10/29/16 0729 10/29/16 1005 10/29/16 1523 10/30/16 0151 10/30/16 1601 10/31/16 0559 10/31/16 1359 11/01/16 0226  NA 137  --  136  --  137  --  137  --   --  135  --  135  --  136  K 5.3*  < > 6.7*  < > 4.6  4.6  < > 4.3 4.5 5.1 4.4 5.0 5.2* 5.2* 4.7  CL 108  --  106  --  105  --  106  --   --  105  --  103  --  101  CO2 20*  --  16*  --  17*  --  18*  --   --  18*  --  14*  --  17*  GLUCOSE 127*  --  137*  --  125*  --  105*  --   --  141*  --  104*  --  133*  BUN 55*  --  60*  --  82*  --  98*  --   --  113*  --  125*  --  96*  CREATININE 2.55*  --  2.98*  --  3.78*  --  4.36*  --   --  5.19*  --  5.85*  --  5.62*  CALCIUM 8.8*  --  9.0  --  8.5*  --  8.4*  --   --  8.0*  --  8.6*  --  8.3*  PHOS  --   --   --   --   --   --   --   --   --  6.5*  --  7.3*  --  7.2*  < > = values in this interval not displayed. CBC  Recent Labs Lab 10/29/16 0729 10/30/16 0151 10/31/16 0559 11/01/16 0226  WBC 12.7* 10.3 17.5* 14.9*  HGB 11.4* 10.7* 11.5* 10.7*  HCT 33.5* 32.4* 35.9* 32.9*  MCV 82.7 83.9 84.1 83.5  PLT 124* 146* 247 164   Medications:    . aspirin EC  81 mg Oral Daily  . atorvastatin  80 mg Oral q1800  . finasteride  5 mg Oral Daily  . gabapentin  300 mg Oral QHS  . insulin aspart  0-15 Units Subcutaneous Q4H  . insulin glargine  8 Units Subcutaneous Daily  . sodium chloride flush  3 mL Intravenous Q12H   Elmarie Shiley, MD 11/01/2016, 8:30 AM

## 2016-11-01 NOTE — Significant Event (Addendum)
Rapid Response Event Note  Overview: Time Called: 2335 Arrival Time: 2340 Event Type: Respiratory  Initial Focused Assessment: Called by RNs to assess patient for acute onset of respiratory distress, I asked them to call RT as well, Upon arrival, patient was profusely diaphoretic, labored breathing, mild tachypnea, clammy/cool/bruised extremities.  Patient just returned from his first HD treatment, RNs checked blood sugar, it was 88, repeat sugar was 99 after I arrived.  Lung sounds very diminished, patient was given breathing treatment by RT and was placed on NRB 15L, intially oxygen saturations were not picking up, pulse ox placed on ear, sats were greater than 90%. I asked RNs to page MD on call and ask that MD come evaluate patient. Patient has been on and off BiPAP this admission as well. MD arrived at bedside, patient placed on BiPAP.  Patient at first was very hard to arouse, unresponsive, after Narcan adminstration, he did respond more, was agitated at times, seems frustrated. BPs were not being read via automatic cuff, SBP 58 was obtained via doppler.  After patient was more awake, BP improved into SBP 90-100s. + NSTEMI this admission, has been hypotensive on and off this admission as well along with having episodes of shortness of breath, + PPM.  Interventions: - Narcan x 1 dose - CXR - slight worsening of consolidation at the left base, increased central vascular congestion - BiPAP started, patient did not wear the mask, refused it, transitioned back to nasal cannula - PRN Ativan as well - Patient is a DNR, family to discuss [place] today.    Plan of Care (if not transferred): - will monitor status for now, if patient decompensates again or further, MD will call family and update them - called for an update at 25, patient's vitals remained stable, per RN breathing is still labored  Event Summary: Name of Physician Notified: Dr. Myna Hidalgo at 2345    at    Outcome: Stayed in room and  stabalized  Event End Time: 0115  Netty Starring

## 2016-11-01 NOTE — Progress Notes (Signed)
RT NOTE:  RT called to assess pt in distress. Pt given not responding to RT. Duoneb administered. Pt began to respond with Narcan IV. Pt still moving little air and Sats 88-92%. BIPAP initiated, pt tolerating. SpO2 increased to 96%. RR & MD @ bedside. RT will monitor.

## 2016-11-01 NOTE — Progress Notes (Signed)
[employer] rep Elisha Headland came to turn off ICD. Family at bedside

## 2016-11-01 NOTE — Consult Note (Signed)
Consultation Note Date: 11/01/2016   Patient Name: [name]  DOB: 01/27/1935  MRN: [id number]  Age / Sex: 82 y.o., male  PCP: Jonathan Morale, MD Referring Physician: Cherene Altes, MD  Reason for Consultation: Establishing goals of care  HPI/Patient Profile: 82 y.o. male  with past medical history of CAD s/p pace maker/ICD, CKD, PVD with left AKA, who was admitted on 10/26/2016 with chest pain.  Work up revealed and elevated troponin and declining kidney function.  Unfortunately the patient's kidney function continued to decline and he developed significant dyspnea at rest requiring Bipap.  He was started in short term hemodialysis yesterday.  He became hypotensive and developed respiratory distress last night.   Clinical Assessment and Goals of Care:   I have reviewed medical records including EPIC notes, labs and imaging, received report from [name], assessed the patient and then met at the bedside along with his son [name] and [name] dtr (who is a Marine scientist)  to discuss diagnosis prognosis, [name], EOL wishes, disposition and options.  I introduced Palliative Medicine as specialized medical care for people living with serious illness. It focuses on providing relief from the symptoms and stress of a serious illness. The goal is to improve quality of life for both the patient and the family.  We discussed a brief life review of the patient. He is from [place]. He has two children.  [name] is his designated [employer].  He and his wife moved here many years ago.  His wife passed in 1999.  He is a [place] [employer] Vet.  [name] reports that he has always been very gregarious, but that his life really seemed to start to decline after his left leg amputation 3 years ago.  He has been "scooter dependent" but still getting around, living on his own, and interacting with people at the grocery store, gas station, doctor's office etc.   Several time over the last 6 months he has commented to [name] that he wanted to die.  [name] understands that his father is very near end of life.  We discussed the episode of hypotension and respiratory distress that happened last night.  [name]'s inclination is to stop interventions and make his father comfortable - but he wants to follow his father's wishes.  Consequently we had a follow up discussion with [name], his daughter and the patient.  The result of which was somewhat confusing.  The patient would like to continue HD and Bipap as needed today in an attempt to see his extended family who will visit tomorrow.  However, He states if his body crashes after HD (like it did last night) he would prefer to be let go peacefully.  I had a separate discussion with [name] about the ICD and recommended that it be turned off at this point.  [name] was thankful for the recommendation and agreed that it should be turned off.  PMT will follow up in the next day or so to assist with further decision making after the family has  had a chance to visit.  Primary Decision Maker:  PATIENT and his son [name].    SUMMARY OF RECOMMENDATIONS    1 or 2 more sessions of HD if his body is able to tolerate it.  Bipap if needed at least until he can see his family tomorrow.    If he completely crashes - let him go peacefully.   These are his wishes.  Order was placed to turn off ICD today.   Patient was complaining of pain - but this was alleviated with repositioning.  Code Status/Advance Care Planning:  DNR   Symptom Management:   No complaints.  Additional Recommendations (Limitations, Scope, Preferences):  Full Scope Treatment  Prognosis: poor in the setting of long standing CAD with NSTEMI, along with newly end-stage kidney disease.  Had a very difficult time last evening after HD.    Discharge Planning: Anticipated Hospital Death vs Hospice facility (He has no family living in town)      Primary  Diagnoses: Present on Admission: . NSTEMI (non-ST elevated myocardial infarction) ([place]) . Essential hypertension . Chronic atrial fibrillation ([place]) . Chronic systolic congestive heart failure ([place]) . CAD (coronary artery disease) . PAD (peripheral artery disease) ([place]) . CKD (chronic kidney disease) stage 4, GFR 15-29 ml/min ([facility]) . DM (diabetes mellitus), type 2, uncontrolled, with renal complications ([place]) . AKI (acute kidney injury) ([place]) . Hyperkalemia . Acute on chronic respiratory failure with hypoxia ([place])   I have reviewed the medical record, interviewed the patient and family, and examined the patient. The following aspects are pertinent.  Past Medical History:  Diagnosis Date  . Arthritis   . Atrial fibrillation ([place])   . Benign prostatic hypertrophy   . CAD (coronary artery disease)    a. s/p mid LAD stenting with DES 2008 Dr. Daneen Schick  . Chronic systolic dysfunction of left ventricle    a. mixed ischemic and nonischemic CM,  EF 35%. b. s/p AICD implantation.  . Constipation   . Depression   . ED (erectile dysfunction)   . Gout   . History of MRSA infection   . Hypertension    takes Carvedilol and Losartan daily  . Insomnia   . PAD (peripheral artery disease) ([facility])    s/p multiple LLE bypass grafts; left mid-distal SCA occlusion by 08/2012 duplex  . Renal artery stenosis ([place])    s/p stenting 2009  . Sleep apnea    hx of "had surgery for"  . Type II diabetes mellitus ([place])    Social History   Social History  . Marital status: Widowed    Spouse name: N/A  . Number of children: N/A  . Years of education: N/A   Occupational History  . Retired Retired   Social History Main Topics  . Smoking status: Current Some Day Smoker    Types: Cigars  . Smokeless tobacco: Never Used     Comment: 1 cigar each month  . Alcohol use 1.8 oz/week    3 Glasses of wine per week  . Drug use: No  . Sexual activity: Not Currently   Other Topics Concern  . None    Social History Narrative   Lives in [place] with significant other,  [name].   Family History  Problem Relation Age of Onset  . Cancer Father   . Cancer Unknown        breast/fhx  . Heart disease Unknown        fhx  . Diabetes Neg Hx  Scheduled Meds: . aspirin EC  81 mg Oral Daily  . atorvastatin  80 mg Oral q1800  . finasteride  5 mg Oral Daily  . gabapentin  300 mg Oral QHS  . insulin aspart  0-15 Units Subcutaneous Q4H  . insulin glargine  8 Units Subcutaneous Daily  . sodium chloride flush  3 mL Intravenous Q12H   Continuous Infusions: PRN Meds:.acetaminophen, ipratropium-albuterol, LORazepam, morphine injection, naLOXone (NARCAN)  injection, nitroGLYCERIN, ondansetron (ZOFRAN) IV, oxyCODONE-acetaminophen **AND** oxyCODONE Allergies  Allergen Reactions  . Other Other (See Comments)    Plastic tape tears skin  . Bactrim Ds [Sulfamethoxazole-Trimethoprim] Other (See Comments)    Hand tremors   Review of Systems patient unable  Physical Exam well developed male, appears very weak, mild SOB when speaking CV distant heart sounds Resp on bipap initially Abdomen soft, nt   Vital Signs: BP (!) 108/52   Pulse 60   Temp 98.4 F (36.9 C) (Axillary)   Resp (!) 30   Ht 5' 7.99" (1.727 m)   Wt 79.3 kg (174 lb 13.2 oz)   SpO2 100%   BMI 26.59 kg/m  Pain Assessment: No/denies pain   Pain Score: Asleep   SpO2: SpO2: 100 % O2 Device:SpO2: 100 % O2 Flow Rate: .O2 Flow Rate (L/min): 4 L/min  IO: Intake/output summary:  Intake/Output Summary (Last 24 hours) at 11/01/16 1054 Last data filed at 10/31/16 2155  Gross per 24 hour  Intake                3 ml  Output             1600 ml  Net            -1597 ml    LBM: Last BM Date: 10/30/16 Baseline Weight: Weight: 77.3 kg (170 lb 8 oz) Most recent weight: Weight: 79.3 kg (174 lb 13.2 oz)     Palliative Assessment/Data:     Time In: 10:00 Time Out: 11:21 Time Total: 81 min. Greater than 50%  of this  time was spent counseling and coordinating care related to the above assessment and plan.  Signed by: Florentina Jenny, PA-C Palliative Medicine Pager: [phone]  Please contact Palliative Medicine Team phone at [phone] for questions and concerns.  For individual provider: See Shea Evans

## 2016-11-01 NOTE — Progress Notes (Signed)
Nutrition  Chart reviewed due to low Braden score. Patient with respiratory distress and hypotension after HD treatment yesterday. Now requiring BiPAP. Plans to continue HD for 1-2 more sessions if he is able to tolerate it, but patient does not want long term HD. Patient wants to see his family who are coming from out of town tomorrow. ICD is being turned off today. Palliative Care following; hospital death vs transfer to Hospice facility expected. No nutrition interventions indicated at this time. Please consult RD if nutrition concerns arise.  Molli Barrows, RD, LDN, [place] Pager [phone] After Hours Pager [phone]

## 2016-11-01 NOTE — Progress Notes (Signed)
Progress Note  Patient Name: [name] Date of Encounter: 11/01/2016  Primary Cardiologist: Dr. Daneen Schick  Subjective   Events from last PM noted; Denies dyspnea or chest pain  Inpatient Medications    Scheduled Meds: . aspirin EC  81 mg Oral Daily  . atorvastatin  80 mg Oral q1800  . finasteride  5 mg Oral Daily  . insulin aspart  0-15 Units Subcutaneous TID WC  . insulin aspart  0-5 Units Subcutaneous QHS  . insulin glargine  8 Units Subcutaneous Daily  . sodium chloride flush  3 mL Intravenous Q12H   Continuous Infusions:  PRN Meds: acetaminophen, fentaNYL (SUBLIMAZE) injection, ipratropium-albuterol, LORazepam, naLOXone (NARCAN)  injection, nitroGLYCERIN, ondansetron (ZOFRAN) IV, oxyCODONE-acetaminophen **AND** oxyCODONE   Vital Signs    Vitals:   11/01/16 0754 11/01/16 0822 11/01/16 1133 11/01/16 1135  BP: (!) 108/52     Pulse: 60 60 64   Resp:   15   Temp:   98.1 F (36.7 C) 98.2 F (36.8 C)  TempSrc:   Oral Oral  SpO2: 100% 100% 97%   Weight:      Height:        Intake/Output Summary (Last 24 hours) at 11/01/16 1259 Last data filed at 10/31/16 2155  Gross per 24 hour  Intake                3 ml  Output             1600 ml  Net            -1597 ml   Filed Weights   10/31/16 0424 10/31/16 1818 10/31/16 2048  Weight: 80.2 kg (176 lb 12.8 oz) 80.8 kg (178 lb 2.1 oz) 79.3 kg (174 lb 13.2 oz)    Telemetry    V pacing- Personally Reviewed   Physical Exam   GEN: WD/WN, mildly dyspneic Neck: supple Cardiac: RRR, 2/6 systolic murmur apex, no gallop Respiratory: diminished BS bases bilaterally; no wheeze GI: Soft, NT/ND MS: no edema; chronic skin changes; s/p amputation on the right Neuro: no focal findings   Labs    Chemistry Recent Labs Lab 10/28/16 0704  10/30/16 0151  10/31/16 0559 10/31/16 1359 11/01/16 0226  NA 137  < > 135  --  135  --  136  K 4.6  4.6  < > 4.4  < > 5.2* 5.2* 4.7  CL 105  < > 105  --  103  --  101    CO2 17*  < > 18*  --  14*  --  17*  GLUCOSE 125*  < > 141*  --  104*  --  133*  BUN 82*  < > 113*  --  125*  --  96*  CREATININE 3.78*  < > 5.19*  --  5.85*  --  5.62*  CALCIUM 8.5*  < > 8.0*  --  8.6*  --  8.3*  PROT 5.8*  --   --   --   --   --   --   ALBUMIN 3.3*  --  3.5  --  3.5  --  3.5  AST 118*  --   --   --   --   --   --   ALT 33  --   --   --   --   --   --   ALKPHOS 45  --   --   --   --   --   --  BILITOT 2.4*  --   --   --   --   --   --   GFRNONAA 14*  < > 9*  --  8*  --  9*  GFRAA 16*  < > 11*  --  9*  --  10*  ANIONGAP 15  < > 12  --  18*  --  18*  < > = values in this interval not displayed.   Hematology  Recent Labs Lab 10/30/16 0151 10/31/16 0559 11/01/16 0226  WBC 10.3 17.5* 14.9*  RBC 3.86* 4.27 3.94*  HGB 10.7* 11.5* 10.7*  HCT 32.4* 35.9* 32.9*  MCV 83.9 84.1 83.5  MCH 27.7 26.9 27.2  MCHC 33.0 32.0 32.5  RDW 15.9* 16.1* 16.0*  PLT 146* 247 164    Cardiac Enzymes  Recent Labs Lab 10/26/16 1400 10/26/16 2230 10/27/16 0607 10/27/16 0808  TROPONINI 4.79* 2.62* 39.06* 46.73*    BNP  Recent Labs Lab 10/27/16 1413  BNP 1,249.1*     Radiology    Dg Chest Port 1 View  Result Date: 11/01/2016 CLINICAL DATA:  Respiratory distress EXAM: PORTABLE CHEST 1 VIEW COMPARISON:  10/31/2016 FINDINGS: Left-sided pacing device as before. Right-sided central venous catheter tip projects over the brachiocephalic confluence. Slight worsening of consolidation at the left base. Otherwise stable small pleural effusions. Cardiomegaly with atherosclerosis. Slight increased central vascular congestion. No pneumothorax. IMPRESSION: 1. Slight interval increase in left lung base consolidation 2. Cardiomegaly with central vascular congestion and mild perihilar edema. Stable appearance of small bilateral effusions. Electronically Signed   By: Donavan Foil M.D.   On: 11/01/2016 00:43   Dg Chest Port 1 View  Result Date: 10/31/2016 CLINICAL DATA:  Central line  placement EXAM: PORTABLE CHEST 1 VIEW COMPARISON:  10/29/2016, 03/22/2016 FINDINGS: Left-sided multi lead pacing device with similar appearance of right atrial lead tip directed inferior. Right-sided central venous catheter has been placed, the tip overlies the brachiocephalic confluence. There is no pneumothorax. Cardiomegaly with small pleural effusions and left basilar atelectasis or pneumonia. Atherosclerosis. IMPRESSION: 1. Right-sided central venous catheter tip superimposes the brachiocephalic confluence. Negative for pneumothorax 2. Small bilateral pleural effusions with left basilar atelectasis or pneumonia 3. Cardiomegaly Electronically Signed   By: Donavan Foil M.D.   On: 10/31/2016 18:52    Cardiac Studies   2-D Doppler echocardiogram April 2016: Study Conclusions  - Left ventricle: The cavity size was normal. Wall thickness was increased in a pattern of mild LVH. Systolic function was vigorous. The estimated ejection fraction was in the range of 65% to 70%. - Aortic valve: There was trivial regurgitation. - Left atrium: The atrium was mildly dilated. - Right atrium: The atrium was mildly dilated.  Patient Profile     82 y.o. 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 Pt transferred to [facility] after presenting to OSH emergency department for evaluation of chest pain. Patient has ruled in for a myocardial infarction. His renal function has worsened since admission.   Assessment & Plan    1. CAD/NSTEMI: Patient remains pain-free. LV function normal on echo; grade 2 DD; moderate AI; mild MR. Continue aspirin and statin. Blood pressure remains low. Renal function continues to deteriorate. We therefore cannot proceed with cardiac catheterization due to risk of contrast nephropathy unless he  agrees to dialysis long term. He understands the risk of undiagnosed  coronary disease including myocardial infarction and death.   2. AKI on CKD stage 3  Renal function continues to deteriorate. Nephrology following. Had dialysis yesterday but became hypotensive last evening. He is for dialysis again today. Not clear that he would agree to long-term dialysis.  3. Chronic afib with PPM - HR controlled with underlying V pacing - coumadin on hold  4. Pacemaker Device interrogated yesterday and functioning appropriately.   Long-term prognosis appears poor. He is now a no CODE BLUE.    Signed, Kirk Ruths, MD  11/01/2016, 12:59 PM

## 2016-11-02 LAB — CBC
HEMATOCRIT: 32.4 % — AB (ref 39.0–52.0)
Hemoglobin: 10.7 g/dL — ABNORMAL LOW (ref 13.0–17.0)
MCH: 27 pg (ref 26.0–34.0)
MCHC: 33 g/dL (ref 30.0–36.0)
MCV: 81.8 fL (ref 78.0–100.0)
PLATELETS: 176 10*3/uL (ref 150–400)
RBC: 3.96 MIL/uL — ABNORMAL LOW (ref 4.22–5.81)
RDW: 16.1 % — AB (ref 11.5–15.5)
WBC: 16.6 10*3/uL — ABNORMAL HIGH (ref 4.0–10.5)

## 2016-11-02 LAB — RENAL FUNCTION PANEL
ALBUMIN: 3.2 g/dL — AB (ref 3.5–5.0)
Anion gap: 16 — ABNORMAL HIGH (ref 5–15)
BUN: 123 mg/dL — AB (ref 6–20)
CHLORIDE: 98 mmol/L — AB (ref 101–111)
CO2: 21 mmol/L — ABNORMAL LOW (ref 22–32)
CREATININE: 6.87 mg/dL — AB (ref 0.61–1.24)
Calcium: 8.2 mg/dL — ABNORMAL LOW (ref 8.9–10.3)
GFR calc Af Amer: 8 mL/min — ABNORMAL LOW (ref >60)
GFR calc non Af Amer: 7 mL/min — ABNORMAL LOW (ref >60)
GLUCOSE: 175 mg/dL — AB (ref 65–99)
POTASSIUM: 4.7 mmol/L (ref 3.5–5.1)
Phosphorus: 7.7 mg/dL — ABNORMAL HIGH (ref 2.5–4.6)
Sodium: 135 mmol/L (ref 135–145)

## 2016-11-02 LAB — MAGNESIUM: MAGNESIUM: 2.6 mg/dL — AB (ref 1.7–2.4)

## 2016-11-02 LAB — GLUCOSE, CAPILLARY
Glucose-Capillary: 160 mg/dL — ABNORMAL HIGH (ref 65–99)
Glucose-Capillary: 164 mg/dL — ABNORMAL HIGH (ref 65–99)
Glucose-Capillary: 178 mg/dL — ABNORMAL HIGH (ref 65–99)
Glucose-Capillary: 151 mg/dL — ABNORMAL HIGH (ref 65–99)

## 2016-11-02 MED ORDER — OXYCODONE HCL 5 MG PO TABS: 5.0000 mg | ORAL_TABLET | ORAL | Status: DC | PRN

## 2016-11-02 MED ORDER — BISACODYL 10 MG RE SUPP: 10.0000 mg | Freq: Every day | RECTAL | Status: DC | PRN

## 2016-11-02 MED ORDER — FENTANYL CITRATE (PF) 100 MCG/2ML IJ SOLN: 25.0000 ug | INTRAMUSCULAR | Status: DC | PRN

## 2016-11-02 MED ORDER — ACETAMINOPHEN 325 MG PO TABS: 650.0000 mg | ORAL_TABLET | ORAL | Status: DC | PRN

## 2016-11-02 MED ORDER — POLYETHYLENE GLYCOL 3350 17 G PO PACK
17.0000 g | PACK | Freq: Every day | ORAL | Status: DC
  Administered 2016-11-03: 17 g via ORAL
  Filled 2016-11-02: qty 1

## 2016-11-02 MED ORDER — SENNOSIDES-DOCUSATE SODIUM 8.6-50 MG PO TABS
1.0000 | ORAL_TABLET | Freq: Two times a day (BID) | ORAL | Status: DC
  Administered 2016-11-02 – 2016-11-03 (×2): 1 via ORAL
  Filled 2016-11-02 (×2): qty 1

## 2016-11-02 MED ORDER — LACTULOSE 10 GM/15ML PO SOLN: 20.0000 g | Freq: Two times a day (BID) | ORAL | Status: DC | PRN

## 2016-11-02 NOTE — Progress Notes (Signed)
Patient ID: JUDGE DUQUE, male   DOB: 27-Jul-1934, 82 y.o.   MRN: [id number]  [facility] Progress Note   Assessment/ Plan:   1. Acute kidney injury on chronic kidney disease stage III/IV: With underlying chronic kidney disease that is likely ischemic nephropathy (history of RAS/HTN) and now what appears to be acute kidney injury from hemodynamic mechanism status post non-STEMI. Had a very eventful postdialysis course with hypotension/respiratory distress and now reluctant to undertake anymore treatments. Unfortunately, renal function continues to deteriorate and I anticipate that he will soon develop encephalopathy. I suspect at this point, transition to comfort care measures/palliative care measures only is appropriate as we have tried dialysis. 2. Non-ST elevation MI: With history of coronary artery disease/PAD, continue medical management per cardiology recommendations while deferring coronary angiogram at this time of acute kidney injury. 3. Hypotension:  Blood pressures remain on the lower side however, patient still mentating and without any additional problems. 4. Respiratory distress: Transient and associated with hypotension postdialysis 2 days ago-currently oxygen supplementation via nasal cannula.  Subjective:   Per the patient/family wishes, hemodialysis not done yesterday as he was anticipating multiple family members to come in yesterday and today to visit with him. He is reluctant to have dialysis for fear of having further hypotension/respiratory distress like he had 2 days ago.    Objective:   BP 129/65 (BP Location: Right Arm)   Pulse 61   Temp (!) 97.5 F (36.4 C)   Resp 18   Ht 5' 7.99" (1.727 m)   Wt 77.4 kg (170 lb 11.2 oz)   SpO2 98%   BMI 25.96 kg/m   Intake/Output Summary (Last 24 hours) at 11/02/16 1028 Last data filed at 11/02/16 0433  Gross per 24 hour  Intake               60 ml  Output              175 ml  Net             -115 ml   Weight  change: -3.371 kg (-7 lb 6.9 oz)  Physical Exam: Gen: Appears to be somewhat uncomfortable resting on BiPAP ZTI:WPYKD regular rhythm, normal rate, 2/6 holosystolic murmur over apex Resp: Anteriorly with coarse breath sounds-no distinct rales or rhonchi Abd: Soft, obese, nontender Ext: Status post left above-knee amputation, right leg with trace-1+ edema  Imaging: Dg Chest Port 1 View  Result Date: 11/01/2016 CLINICAL DATA:  Respiratory distress EXAM: PORTABLE CHEST 1 VIEW COMPARISON:  10/31/2016 FINDINGS: Left-sided pacing device as before. Right-sided central venous catheter tip projects over the brachiocephalic confluence. Slight worsening of consolidation at the left base. Otherwise stable small pleural effusions. Cardiomegaly with atherosclerosis. Slight increased central vascular congestion. No pneumothorax. IMPRESSION: 1. Slight interval increase in left lung base consolidation 2. Cardiomegaly with central vascular congestion and mild perihilar edema. Stable appearance of small bilateral effusions. Electronically Signed   By: Donavan Foil M.D.   On: 11/01/2016 00:43   Dg Chest Port 1 View  Result Date: 10/31/2016 CLINICAL DATA:  Central line placement EXAM: PORTABLE CHEST 1 VIEW COMPARISON:  10/29/2016, 03/22/2016 FINDINGS: Left-sided multi lead pacing device with similar appearance of right atrial lead tip directed inferior. Right-sided central venous catheter has been placed, the tip overlies the brachiocephalic confluence. There is no pneumothorax. Cardiomegaly with small pleural effusions and left basilar atelectasis or pneumonia. Atherosclerosis. IMPRESSION: 1. Right-sided central venous catheter tip superimposes the brachiocephalic confluence. Negative for pneumothorax 2.  Small bilateral pleural effusions with left basilar atelectasis or pneumonia 3. Cardiomegaly Electronically Signed   By: Donavan Foil M.D.   On: 10/31/2016 18:52    Labs: BMET  Recent Labs Lab 10/27/16 0606   10/28/16 0704  10/29/16 0729  10/30/16 0151 10/30/16 1601 10/31/16 0559 10/31/16 1359 11/01/16 0226 11/01/16 1805 11/02/16 0910  NA 136  --  137  --  137  --  135  --  135  --  136  --  135  K 6.7*  < > 4.6  4.6  < > 4.3  < > 4.4 5.0 5.2* 5.2* 4.7 5.1 4.7  CL 106  --  105  --  106  --  105  --  103  --  101  --  98*  CO2 16*  --  17*  --  18*  --  18*  --  14*  --  17*  --  21*  GLUCOSE 137*  --  125*  --  105*  --  141*  --  104*  --  133*  --  175*  BUN 60*  --  82*  --  98*  --  113*  --  125*  --  96*  --  123*  CREATININE 2.98*  --  3.78*  --  4.36*  --  5.19*  --  5.85*  --  5.62*  --  6.87*  CALCIUM 9.0  --  8.5*  --  8.4*  --  8.0*  --  8.6*  --  8.3*  --  8.2*  PHOS  --   --   --   --   --   --  6.5*  --  7.3*  --  7.2*  --  7.7*  < > = values in this interval not displayed. CBC  Recent Labs Lab 10/30/16 0151 10/31/16 0559 11/01/16 0226 11/02/16 0910  WBC 10.3 17.5* 14.9* 16.6*  HGB 10.7* 11.5* 10.7* 10.7*  HCT 32.4* 35.9* 32.9* 32.4*  MCV 83.9 84.1 83.5 81.8  PLT 146* 247 164 176   Medications:    . aspirin EC  81 mg Oral Daily  . atorvastatin  80 mg Oral q1800  . finasteride  5 mg Oral Daily  . insulin aspart  0-15 Units Subcutaneous TID WC  . insulin aspart  0-5 Units Subcutaneous QHS  . insulin glargine  8 Units Subcutaneous Daily  . sodium chloride flush  3 mL Intravenous Q12H   Elmarie Shiley, MD 11/02/2016, 10:28 AM

## 2016-11-02 NOTE — Progress Notes (Signed)
TEAM 1 - Stepdown/ICU TEAM  [name]  ZDG:[id number] DOB: Jul 07, 1934 DOA: 10/26/2016 PCP: Laurey Morale, MD    Brief Narrative:  82 y.o. 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 Pt transferred to [facility] after presenting to [facility] for evaluation of chest pain. Patient ruled in for a myocardial infarction. His renal function has worsened since admission.   Subjective: Pt has elected to forgo any further HD treatments.  We are focusing primarily on comfort at this time.  He is more alert and tells me he is not sob or nauseated.  He c/o soreness and pain in his "back side."    Assessment & Plan:  NSTEMI  Troponin peaked at ~46 - known CAD with stent to LAD - unable to have cath due to renal disease - cont medical management - no BB due to hypotension   Acute renal failure on CKD stage III baseline Cr ~2 - suspect ischemic nephropathy - Nephrology following - did not tolerate HD well w/ severe dyspnea and hypotension post treatement - pt has opted to avoid further HD at this time   Recent Labs Lab 10/29/16 0729 10/30/16 0151 10/31/16 0559 11/01/16 0226 11/02/16 0910  CREATININE 4.36* 5.19* 5.85* 5.62* 6.87*    Chronic Diastolic CHF no gross overload on exam at this time - no IVF - minimal intake  Filed Weights   10/31/16 1818 10/31/16 2048 11/02/16 0323  Weight: 80.8 kg (178 lb 2.1 oz) 79.3 kg (174 lb 13.2 oz) 77.4 kg (170 lb 11.2 oz)    Paroxysmal Atrial fibrillation CHADS-VASc is 6 - on coumadin prior to admit - no anticoagulation at this time as pt is rapidly declining   Hypotension in pt w/ hx HTN Avoiding meds which may lower BP - no fluid challenge due to kidney disease   Acute hypoxic respiratory failure  Due to NSTEMI, chronic diastolic CHF, acute kidney  failure, hypotension - likely worsened by sedation of uremia as well - intermittent prn BIPAP if needed purely for comfort, but will not escalate beyond that    OSA BIPAP only PRN at this point  DM2 CBG currently reasonably controlled - no need to cont to follow   Hyperkalemia  Due to renal failure - initially corrected w/ HD   DVT prophylaxis: heparin gtt  Code Status: DNR - NO CODE Family Communication:  Spoke w/ pt and multiple extended family at bedside   Disposition Plan: conservative medical care continues, w/ focus primarily on comfort    Consultants:  Cardiology Nephrology Palliative Care    Procedures: 8/27 TTE - EF 65-70% - grade 2 DD  Antimicrobials:  none   Objective: Blood pressure (!) 84/64, pulse (!) 59, temperature (!) 97.5 F (36.4 C), resp. rate 18, height 5' 7.99" (1.727 m), weight 77.4 kg (170 lb 11.2 oz), SpO2 97 %.  Intake/Output Summary (Last 24 hours) at 11/02/16 1508 Last data filed at 11/02/16 0945  Gross per 24 hour  Intake              180 ml  Output              175 ml  Net                5 ml   Filed Weights   10/31/16 1818 10/31/16 2048 11/02/16 5956  Weight: 80.8 kg (178 lb 2.1 oz) 79.3 kg (174 lb 13.2 oz) 77.4 kg (170 lb 11.2 oz)    Examination: General: more alert but mildly confused  Lungs: fine distant crackles th/o  Cardiovascular: RRR w/o rub  Abdomen: Nondistended, soft, bowel sounds positive but hypo, no rebound Extremities: trace LE edema    CBC:  Recent Labs Lab 10/29/16 0729 10/30/16 0151 10/31/16 0559 11/01/16 0226 11/02/16 0910  WBC 12.7* 10.3 17.5* 14.9* 16.6*  HGB 11.4* 10.7* 11.5* 10.7* 10.7*  HCT 33.5* 32.4* 35.9* 32.9* 32.4*  MCV 82.7 83.9 84.1 83.5 81.8  PLT 124* 146* 247 164 673   Basic Metabolic Panel:  Recent Labs Lab 10/29/16 0729  10/30/16 0151  10/31/16 0559 10/31/16 1359 11/01/16 0226 11/01/16 1805 11/02/16 0910  NA 137  --  135  --  135  --  136  --  135  K 4.3  < > 4.4  < > 5.2*  5.2* 4.7 5.1 4.7  CL 106  --  105  --  103  --  101  --  98*  CO2 18*  --  18*  --  14*  --  17*  --  21*  GLUCOSE 105*  --  141*  --  104*  --  133*  --  175*  BUN 98*  --  113*  --  125*  --  96*  --  123*  CREATININE 4.36*  --  5.19*  --  5.85*  --  5.62*  --  6.87*  CALCIUM 8.4*  --  8.0*  --  8.6*  --  8.3*  --  8.2*  MG 2.4  --  2.3  --  2.3  --  2.3  --  2.6*  PHOS  --   --  6.5*  --  7.3*  --  7.2*  --  7.7*  < > = values in this interval not displayed. GFR: Estimated Creatinine Clearance: 8.3 mL/min (A) (by C-G formula based on SCr of 6.87 mg/dL (H)).  Liver Function Tests:  Recent Labs Lab 10/28/16 0704 10/30/16 0151 10/31/16 0559 11/01/16 0226 11/02/16 0910  AST 118*  --   --   --   --   ALT 33  --   --   --   --   ALKPHOS 45  --   --   --   --   BILITOT 2.4*  --   --   --   --   PROT 5.8*  --   --   --   --   ALBUMIN 3.3* 3.5 3.5 3.5 3.2*    Coagulation Profile:  Recent Labs Lab 10/27/16 0605  INR 1.90    Cardiac Enzymes:  Recent Labs Lab 10/26/16 2230 10/27/16 0607 10/27/16 0808  TROPONINI 2.62* 39.06* 46.73*    HbA1C: Hgb A1c MFr Bld  Date/Time Value Ref Range Status  09/13/2016 09:46 AM 7.0 (H) 4.6 - 6.5 % Final    Comment:    Glycemic Control Guidelines for People with Diabetes:Non Diabetic:  <6%Goal of Therapy: <7%Additional Action Suggested:  >8%   08/28/2015 08:52 AM 6.5 4.6 - 6.5 % Final    Comment:    Glycemic Control Guidelines for People with Diabetes:Non Diabetic:  <6%Goal of Therapy: <7%Additional Action Suggested:  >8%     CBG:  Recent Labs Lab 11/01/16 1605 11/01/16 2056 11/02/16 0427 11/02/16 0735 11/02/16 1142  GLUCAP 205* 221* 160* 164* 178*    Recent Results (from the  past 240 hour(s))  MRSA PCR Screening     Status: None   Collection Time: 10/26/16 10:47 PM  Result Value Ref Range Status   MRSA by PCR NEGATIVE NEGATIVE Final    Comment:        The GeneXpert MRSA Assay (FDA approved for NASAL  specimens only), is one component of a comprehensive MRSA colonization surveillance program. It is not intended to diagnose MRSA infection nor to guide or monitor treatment for MRSA infections.   Urine Culture     Status: None   Collection Time: 10/27/16  6:49 AM  Result Value Ref Range Status   Specimen Description URINE, RANDOM  Final   Special Requests NONE  Final   Culture NO GROWTH  Final   Report Status 10/28/2016 FINAL  Final  Culture, expectorated sputum-assessment     Status: None   Collection Time: 10/27/16  7:47 AM  Result Value Ref Range Status   Specimen Description SPUTUM  Final   Special Requests NONE  Final   Sputum evaluation   Final    Sputum specimen not acceptable for testing.  Please recollect.   Gram Stain Report Called to,Read Back By and Verified With: K HUTCHINS,RN AT 6468 10/27/16 BY L BENFIELD    Report Status 10/27/2016 FINAL  Final  Urine Culture     Status: None   Collection Time: 10/28/16 11:45 PM  Result Value Ref Range Status   Specimen Description URINE, CLEAN CATCH  Final   Special Requests NONE  Final   Culture NO GROWTH  Final   Report Status 10/30/2016 FINAL  Final     Scheduled Meds: . aspirin EC  81 mg Oral Daily  . atorvastatin  80 mg Oral q1800  . finasteride  5 mg Oral Daily  . insulin aspart  0-15 Units Subcutaneous TID WC  . insulin aspart  0-5 Units Subcutaneous QHS  . insulin glargine  8 Units Subcutaneous Daily  . sodium chloride flush  3 mL Intravenous Q12H     LOS: 7 days   Cherene Altes, MD [facility] Office  [phone] Pager - Text Page per Amion as per below:  On-Call/Text Page:      [email or web address]      password [email or web address]  If 7PM-7AM, please contact night-coverage [email or web address] Password [email or web address] 11/02/2016, 3:08 PM

## 2016-11-03 DIAGNOSIS — Z515 Encounter for palliative care: Secondary | ICD-10-CM

## 2016-11-03 DIAGNOSIS — Z7189 Other specified counseling: Secondary | ICD-10-CM

## 2016-11-03 LAB — GLUCOSE, CAPILLARY
GLUCOSE-CAPILLARY: 158 mg/dL — AB (ref 65–99)
GLUCOSE-CAPILLARY: 161 mg/dL — AB (ref 65–99)
Glucose-Capillary: 169 mg/dL — ABNORMAL HIGH (ref 65–99)

## 2016-11-03 LAB — RENAL FUNCTION PANEL
ALBUMIN: 3.3 g/dL — AB (ref 3.5–5.0)
Anion gap: 17 — ABNORMAL HIGH (ref 5–15)
BUN: 139 mg/dL — AB (ref 6–20)
CO2: 18 mmol/L — ABNORMAL LOW (ref 22–32)
CREATININE: 7.36 mg/dL — AB (ref 0.61–1.24)
Calcium: 8.3 mg/dL — ABNORMAL LOW (ref 8.9–10.3)
Chloride: 99 mmol/L — ABNORMAL LOW (ref 101–111)
GFR, EST AFRICAN AMERICAN: 7 mL/min — AB (ref >60)
GFR, EST NON AFRICAN AMERICAN: 6 mL/min — AB (ref >60)
Glucose, Bld: 167 mg/dL — ABNORMAL HIGH (ref 65–99)
PHOSPHORUS: 8.7 mg/dL — AB (ref 2.5–4.6)
POTASSIUM: 5 mmol/L (ref 3.5–5.1)
Sodium: 134 mmol/L — ABNORMAL LOW (ref 135–145)

## 2016-11-03 MED ORDER — FENTANYL CITRATE (PF) 100 MCG/2ML IJ SOLN: 25.0000 ug | INTRAMUSCULAR | 0 refills | Status: AC | PRN

## 2016-11-03 MED ORDER — ONDANSETRON HCL 4 MG/2ML IJ SOLN: 4.0000 mg | Freq: Four times a day (QID) | INTRAMUSCULAR | 0 refills | Status: AC | PRN

## 2016-11-03 MED ORDER — FENTANYL 50 MCG/HR TD PT72: 50.0000 ug | MEDICATED_PATCH | TRANSDERMAL | 0 refills | Status: AC

## 2016-11-03 MED ORDER — FENTANYL 25 MCG/HR TD PT72
50.0000 ug | MEDICATED_PATCH | TRANSDERMAL | Status: DC
  Administered 2016-11-03: 50 ug via TRANSDERMAL
  Filled 2016-11-03: qty 2

## 2016-11-03 MED ORDER — HALOPERIDOL LACTATE 5 MG/ML IJ SOLN: 0.5000 mg | INTRAMUSCULAR | Status: AC | PRN

## 2016-11-03 MED ORDER — HALOPERIDOL LACTATE 5 MG/ML IJ SOLN: 0.5000 mg | INTRAMUSCULAR | Status: DC | PRN

## 2016-11-03 MED ORDER — GLYCOPYRROLATE 1 MG PO TABS: 1.0000 mg | ORAL_TABLET | ORAL | Status: AC | PRN

## 2016-11-03 MED ORDER — LACTULOSE 10 GM/15ML PO SOLN: 20.0000 g | Freq: Two times a day (BID) | ORAL | 0 refills | Status: AC | PRN

## 2016-11-03 MED ORDER — BIOTENE DRY MOUTH MT LIQD: 15.0000 mL | OROMUCOSAL | Status: DC | PRN

## 2016-11-03 MED ORDER — HALOPERIDOL 0.5 MG PO TABS: 0.5000 mg | ORAL_TABLET | ORAL | Status: AC | PRN

## 2016-11-03 MED ORDER — HALOPERIDOL LACTATE 2 MG/ML PO CONC
0.5000 mg | ORAL | Status: DC | PRN
  Filled 2016-11-03: qty 0.3

## 2016-11-03 MED ORDER — HALOPERIDOL LACTATE 2 MG/ML PO CONC
0.5000 mg | ORAL | 0 refills | Status: AC | PRN
Start: 2016-11-03 — End: ?

## 2016-11-03 MED ORDER — GLYCOPYRROLATE 0.2 MG/ML IJ SOLN: 0.2000 mg | INTRAMUSCULAR | Status: DC | PRN

## 2016-11-03 MED ORDER — GLYCOPYRROLATE 1 MG PO TABS: 1.0000 mg | ORAL_TABLET | ORAL | Status: DC | PRN

## 2016-11-03 MED ORDER — POLYVINYL ALCOHOL 1.4 % OP SOLN: 1.0000 [drp] | Freq: Four times a day (QID) | OPHTHALMIC | 0 refills | Status: AC | PRN

## 2016-11-03 MED ORDER — BISACODYL 10 MG RE SUPP: 10.0000 mg | Freq: Every day | RECTAL | 0 refills | Status: AC | PRN

## 2016-11-03 MED ORDER — GLYCOPYRROLATE 0.2 MG/ML IJ SOLN: 0.2000 mg | INTRAMUSCULAR | Status: AC | PRN

## 2016-11-03 MED ORDER — LORAZEPAM 2 MG/ML IJ SOLN: 0.5000 mg | INTRAMUSCULAR | 0 refills | Status: AC | PRN

## 2016-11-03 MED ORDER — SODIUM CHLORIDE 0.9% FLUSH: 3.0000 mL | Freq: Two times a day (BID) | INTRAVENOUS | Status: AC

## 2016-11-03 MED ORDER — SENNOSIDES-DOCUSATE SODIUM 8.6-50 MG PO TABS: 1.0000 | ORAL_TABLET | Freq: Two times a day (BID) | ORAL | Status: AC

## 2016-11-03 MED ORDER — HALOPERIDOL 0.5 MG PO TABS
0.5000 mg | ORAL_TABLET | ORAL | Status: DC | PRN
  Filled 2016-11-03: qty 1

## 2016-11-03 MED ORDER — POLYVINYL ALCOHOL 1.4 % OP SOLN
1.0000 [drp] | Freq: Four times a day (QID) | OPHTHALMIC | Status: DC | PRN
  Filled 2016-11-03: qty 15

## 2016-11-03 MED ORDER — NITROGLYCERIN 0.4 MG SL SUBL
0.4000 mg | SUBLINGUAL_TABLET | SUBLINGUAL | 12 refills | Status: AC | PRN
Start: 2016-11-03 — End: ?

## 2016-11-03 NOTE — Progress Notes (Addendum)
1530--[facility]--[facility] RN Visit  Received request from [place], [place] for family interest in [place] with request for transfer today. Chart reviewed and received report from [place], bedside RN. Met with son, [name] to confirm interest and explain services. Family agreeable to transfer today. Wilburn Cornelia, [place] aware.  Registration paper work completed. Dr. Orpah Melter to assume care per family request.  Please fax discharge summary to [phone].  RN please call report to [phone].  Awaiting on HD catheter to be discontinued to send patient to [facility]. Once catheter discontinued please call hospital liaison at the number below to arrange for transportation.  Family requested [facility] to come. Called and spoke with Chaplain who is reaching out to [place] to see if a Idelle Crouch can meet with family and patient at [facility].  1615--HD catheter discontinued. [facility] notified for transport.  Thank you, Margaretmary Eddy, RN, [facility] Liaison [phone]

## 2016-11-03 NOTE — Clinical Social Work Note (Addendum)
Clinical Social Worker facilitated patient discharge including contacting patient family (@ bedside) and facility [facility]) to confirm patient discharge plans. Clinical information faxed to facility and family agreeable with plan. CSW arranged ambulance transport via [facility] to [facility]. RN to call report to [phone] prior to discharge.  Clinical Social Worker will sign off for now as social work intervention is no longer needed. Please consult [place] again if new need arises.  Nirvi Boehler B. Joline Maxcy Clinical Social Work Dept Weekend Social Worker [phone] 3:42 PM

## 2016-11-03 NOTE — Progress Notes (Signed)
Patient ID: [name], male   DOB: 05-28-1934, 82 y.o.   MRN: [id number] Plans noted for transition to comfort care after the patient declined further dialysis following a rather eventful postdialysis course on Thursday evening with hypotension/respiratory distress.  I will sign off at this time and renal service will remain available for questions or concerns.  Elmarie Shiley MD [facility]. Office # [phone] Pager # [phone] 10:30 AM

## 2016-11-03 NOTE — Progress Notes (Signed)
Daily Progress Note   Patient Name: [name]       Date: 11/03/2016 DOB: 08-26-1934  Age: 82 y.o. MRN#: [id number] Attending Physician: Cherene Altes, MD Primary Care Physician: Laurey Morale, MD Admit Date: 10/26/2016  Reason for Consultation/Follow-up: Establishing goals of care and Psychosocial/spiritual support  Subjective: Patient pleasant, no acute distress.  Mildly confused.  I spoke with his son, [name], and G'dtr.  We discussed transitioning to comfort measures and to [facility].  [name] is having a little difficulty navigation the transition to comfort measures - but after a thorough conversation he agrees to [employer] and requests [facility] as his mother passed away there years ago.  G'dtr describes the patient as being in some distress last night and requests comfort meds.  We discussed opioids to relieve his dyspnea and the discontinuation of monitoring, labs, medications.  Assessment: 82 yo male with NSTEMI and kidney failure.  Not pursuing HD.   Patient Profile/HPI:  82 y.o. male  with past medical history of CAD s/p pace maker/ICD, CKD, PVD with left AKA, who was admitted on 10/26/2016 with chest pain.  Work up revealed and elevated troponin and declining kidney function.  Unfortunately the patient's kidney function continued to decline and he developed significant dyspnea at rest requiring Bipap.  He was started in short term hemodialysis but became hypotensive and developed respiratory distress.     Length of Stay: 8  Current Medications: Scheduled Meds:  . fentaNYL  50 mcg Transdermal Q72H  . senna-docusate  1 tablet Oral BID  . sodium chloride flush  3 mL Intravenous Q12H    Continuous Infusions:   PRN Meds: acetaminophen, antiseptic oral rinse,  bisacodyl, fentaNYL (SUBLIMAZE) injection, glycopyrrolate **OR** glycopyrrolate **OR** glycopyrrolate, haloperidol **OR** haloperidol **OR** haloperidol lactate, ipratropium-albuterol, lactulose, LORazepam, nitroGLYCERIN, ondansetron (ZOFRAN) IV, polyvinyl alcohol  Physical Exam       Very pleasant male, Awake, but mildly confused, NAD CV rrr Resp mildly increased work of breathing Abdomen NT  Vital Signs: BP 92/72 (BP Location: Right Arm)   Pulse (!) 59   Temp 97.6 F (36.4 C) (Oral)   Resp 20   Ht 5' 7.99" (1.727 m)   Wt 80.6 kg (177 lb 12.8 oz)   SpO2 100%   BMI 27.04 kg/m  SpO2: SpO2: 100 %  O2 Device: O2 Device: Nasal Cannula O2 Flow Rate: O2 Flow Rate (L/min): 4 L/min  Intake/output summary:  Intake/Output Summary (Last 24 hours) at 11/03/16 1302 Last data filed at 11/03/16 1200  Gross per 24 hour  Intake              360 ml  Output              150 ml  Net              210 ml   LBM: Last BM Date: 11/03/16 Baseline Weight: Weight: 77.3 kg (170 lb 8 oz) Most recent weight: Weight: 80.6 kg (177 lb 12.8 oz)       Palliative Assessment/Data:      Patient Active Problem List   Diagnosis Date Noted  . Palliative care encounter   . Goals of care, counseling/discussion   . Acute on chronic combined systolic and diastolic CHF (congestive heart failure) ([place])   . Acute kidney injury superimposed on CKD ([place])   . NSTEMI (non-ST elevated myocardial infarction) ([place]) 10/26/2016  . AKI (acute kidney injury) ([place]) 10/26/2016  . Hyperkalemia 10/26/2016  . Acute on chronic respiratory failure with hypoxia ([place]) 10/26/2016  . Neuroma of amputation stump of left lower extremity ([place]) 04/01/2016  . Decubitus ulcer of sacral area 08/17/2015  . Phantom limb syndrome with pain ([place]) 08/03/2014  . Diabetic polyneuropathy associated with diabetes mellitus due to underlying condition ([place]) 07/12/2014  . CVA (cerebral infarction) 06/15/2014  . Expressive aphasia 06/15/2014  . CKD  (chronic kidney disease) stage 4, GFR 15-29 ml/min (HCC) 06/15/2014  . DM (diabetes mellitus), type 2, uncontrolled, with renal complications ([place]) 72/53/6644  . History of left above knee amputation ([place]) 02/09/2013  . S/P ICD (internal cardiac defibrillator) procedure 05/18/2012  . PAD (peripheral artery disease) ([place]) 09/04/2011  . CAD (coronary artery disease)   . Chronic systolic congestive heart failure ([place]) 07/06/2010  . Gout 09/18/2007  . Essential hypertension 01/23/2007  . Chronic atrial fibrillation ([place]) 01/23/2007  . BPH (benign prostatic hyperplasia) 01/23/2007    Palliative Care Plan    Recommendations/Plan:  Transition to comfort measures  CSW order placed for hospice house - will not transfer to 6N as patient will likely go to [facility] today.  Fentanyl patch ordered in addition to current pain regimen.  Goals of Care and Additional Recommendations:  Limitations on Scope of Treatment: Full Comfort Care  Code Status:  DNR  Prognosis:  Days - patient with NSTEMI, end stage renal disease (does not want HD), severe peripheral vascular disease with recent respiratory distress.  Discharge Planning:  Hospice facility  Care plan was discussed with family and RN.    Thank you for allowing the Palliative Medicine Team to assist in the care of this patient.  Total time spent:  60 min.     Greater than 50%  of this time was spent counseling and coordinating care related to the above assessment and plan.  Florentina Jenny, PA-C Palliative Medicine  Please contact Palliative MedicineTeam phone at [phone] for questions and concerns between 7 am - 7 pm.   Please see AMION for individual provider pager numbers.

## 2016-11-03 NOTE — Discharge Instructions (Signed)
Hospice Introduction Hospice is a service that is designed to provide people who are terminally ill and their families with medical, spiritual, and psychological support. Its aim is to improve your quality of life by keeping you as alert and comfortable as possible. Who will be my providers when I begin hospice care? Hospice teams often include:  A nurse.  A doctor. The hospice doctor will be available for your care, but you can bring your regular doctor or nurse practitioner.  Social workers.  Religious leaders (such as a Clinical biochemist).  Trained volunteers.  What roles will providers play in my care? Hospice is performed by a team of health care professionals and volunteers who:  Help keep you comfortable: ? Hospice can be provided in your home or in a homelike setting. ? The hospice staff works with your family and friends to help meet your needs. ? You will enjoy the support of loved ones by receiving much of your basic care from family and friends.  Provide pain relief and manage your symptoms. The staff supply all necessary medicines and equipment.  Provide companionship when you are alone.  Allow you and your family to rest. They may do light housekeeping, prepare meals, and run errands.  Provide counseling. They will make sure your emotional, spiritual, and social needs and those of your family are being met.  Provide spiritual care: ? Spiritual care will be individualized to meet your needs and your family's needs. ? Spiritual care may involve:  Helping you look at what death means to you.  Helping you say goodbye to your family and friends.  Performing a specific religious ceremony or ritual.  When should hospice care begin? Most people who use hospice are believed to have fewer than 6 months to live.  Your family and health care providers can help you decide when hospice services should begin.  If your condition improves, you may discontinue the program.  What  should I consider before selecting a program? Most hospice programs are run by nonprofit, independent organizations. Some are affiliated with hospitals, nursing homes, or home health care agencies. Hospice programs can take place in the home or at a hospice center, hospital, or skilled nursing facility. When choosing a hospice program, ask the following questions:  What services are available to me?  What services will be offered to my loved ones?  How involved will my loved ones be?  How involved will my health care provider be?  Who makes up the hospice care team? How are they trained or screened?  How will my pain and symptoms be managed?  If my circumstances change, can the services be provided in a different setting, such as my home or in the hospital?  Is the program reviewed and licensed by the state or certified in some other way?  Where can I learn more about hospice? You can learn about existing hospice programs in your area from your health care providers. You can also read more about hospice online. The websites of the following organizations contain helpful information:  The [facility] and Palliative Care Organization [facility]).  The Hospice Association of America ([place]).  The [place].  The American Cancer Society (ACS).  Hospice Net.  This information is not intended to replace advice given to you by your health care provider. Make sure you discuss any questions you have with your health care provider. Document Released: 06/07/2003 Document Revised: 10/05/2015 Document Reviewed: 12/29/2012 Elsevier Interactive Patient Education  2017 [employer].

## 2016-11-03 NOTE — Progress Notes (Signed)
Contacted by the Hospice nurse about getting hold of [place] so a priest can visit the patient at [facility].  Was able to facilitate a priest going later on this evening to visit him.

## 2016-11-03 NOTE — Progress Notes (Signed)
Spoke with RN regarding d/c CVC order.  RN to modify order to reflect removal of HD catheter.

## 2016-11-03 NOTE — Progress Notes (Signed)
Visited with patient per consult request.  Son was present.  When patient woke up he told me that he was [place] and that his [place] had been notified and had come by.  I told him if anything changes and he wants [place] to call the parish at anytime to just pages [place] and we can facilitate. Chaplain Katherene Ponto

## 2016-11-03 NOTE — Discharge Summary (Signed)
DISCHARGE SUMMARY  [name]  MR#: [id number]  DOB:08/31/1934  Date of Admission: 10/26/2016 Date of Discharge: 11/03/2016  Attending Physician:Brylan Dec T  Patient's PCP:Fry, Ishmael Holter, MD  Consults: Cardiology Nephrology Palliative Care    Disposition: D/C to [facility]   Discharge Diagnoses: NSTEMI  Acute renal failure on CKD stage III Chronic Diastolic CHF Paroxysmal Atrial fibrillation Hypotension in pt w/ hx HTN Acute hypoxic respiratory failure  OSA DM2 Hyperkalemia   Initial presentation: 82 y.o.malewith h/o atrial fibrillation, AICD, chronic ischemic heart disease with prior LAD stenting, severe peripheral vascular disease with right leg claudication and 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 Pt transferred to [facility] after presenting to [facility] for evaluation of chest pain. Patient ruled in for a myocardial infarction. His renal function worsened markedly during his hospital stay.   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 - Nephrology followed - did not tolerate HD well w/ severe dyspnea and hypotension post treatement - pt opted to avoid further HD - Palliative Care was consulted and the pt ultimately chose to pursue comfort focused care - [facility] was contacted, and has arranged for the pt to be transferred to [facility] for ongoing comfort care   Chronic Diastolic CHF  Paroxysmal Atrial fibrillation CHADS-VASc is 6 - on coumadin prior to admit - no anticoagulation at this time as pt is rapidly declining   Hypotension in pt w/ hx HTN   Acute hypoxic respiratory failure  Due to NSTEMI, chronic diastolic CHF, acute kidney failure, hypotension - likely worsened by sedation  of uremia as well   OSA  DM2 CBG currently reasonably controlled - no need to cont to follow   Hyperkalemia  Due to renal failure - initially corrected w/ HD    Allergies as of 11/03/2016      Reactions   Other Other (See Comments)   Plastic tape tears skin   Bactrim Ds [sulfamethoxazole-trimethoprim] Other (See Comments)   Hand tremors      Medication List    STOP taking these medications   acetaminophen 500 MG tablet Commonly known as:  TYLENOL   allopurinol 100 MG tablet Commonly known as:  ZYLOPRIM   amLODipine 5 MG tablet Commonly known as:  NORVASC   aspirin EC 81 MG tablet   carvedilol 25 MG tablet Commonly known as:  COREG   COQ10 PO   finasteride 5 MG tablet Commonly known as:  PROSCAR   furosemide 40 MG tablet Commonly known as:  LASIX   gabapentin 300 MG capsule Commonly known as:  NEURONTIN   insulin aspart protamine- aspart (70-30) 100 UNIT/ML injection Commonly known as:  NOVOLOG MIX 70/30   Insulin Pen Needle 31G X 8 MM Misc   losartan 100 MG tablet Commonly known as:  COZAAR   ONE TOUCH ULTRA TEST test strip Generic drug:  glucose blood   oxyCODONE-acetaminophen 10-325 MG tablet Commonly known as:  PERCOCET   tamsulosin 0.4 MG Caps capsule Commonly known as:  FLOMAX   VITAMIN C PO   VITAMIN E PO   warfarin 5 MG tablet Commonly known as:  COUMADIN     TAKE these medications   bisacodyl 10 MG suppository Commonly known as:  DULCOLAX Place 1 suppository (10 mg total) rectally daily as needed for moderate constipation.   fentaNYL 100 MCG/2ML injection Commonly known as:  SUBLIMAZE Inject 0.5-1 mLs (25-50 mcg total) into the vein every hour as needed for severe pain.   fentaNYL 50 MCG/HR Commonly known as:  DURAGESIC - dosed mcg/hr Place 1 patch (50 mcg total) onto the skin every 3 (three) days.   glycopyrrolate 1 MG tablet Commonly known as:  ROBINUL Take 1 tablet (1 mg total) by mouth every 4 (four) hours as needed  (excessive secretions).   glycopyrrolate 0.2 MG/ML injection Commonly known as:  ROBINUL Inject 1 mL (0.2 mg total) into the skin every 4 (four) hours as needed (excessive secretions).   glycopyrrolate 0.2 MG/ML injection Commonly known as:  ROBINUL Inject 1 mL (0.2 mg total) into the vein every 4 (four) hours as needed (excessive secretions).   haloperidol 0.5 MG tablet Commonly known as:  HALDOL Take 1 tablet (0.5 mg total) by mouth every 4 (four) hours as needed for agitation (or delirium).   haloperidol 2 MG/ML solution Commonly known as:  HALDOL Place 0.3 mLs (0.6 mg total) under the tongue every 4 (four) hours as needed for agitation (or delirium).   haloperidol lactate 5 MG/ML injection Commonly known as:  HALDOL Inject 0.1 mLs (0.5 mg total) into the vein every 4 (four) hours as needed (or delirium).   lactulose 10 GM/15ML solution Commonly known as:  CHRONULAC Take 30 mLs (20 g total) by mouth 2 (two) times daily as needed for mild constipation.   LORazepam 2 MG/ML injection Commonly known as:  ATIVAN Inject 0.25 mLs (0.5 mg total) into the vein every 4 (four) hours as needed for anxiety.   nitroGLYCERIN 0.4 MG SL tablet Commonly known as:  NITROSTAT Place 1 tablet (0.4 mg total) under the tongue every 5 (five) minutes x 3 doses as needed for chest pain. What changed:  when to take this  additional instructions   ondansetron 4 MG/2ML Soln injection Commonly known as:  ZOFRAN Inject 2 mLs (4 mg total) into the vein every 6 (six) hours as needed for nausea.   polyvinyl alcohol 1.4 % ophthalmic solution Commonly known as:  LIQUIFILM TEARS Place 1 drop into both eyes 4 (four) times daily as needed for dry eyes.   senna-docusate 8.6-50 MG tablet Commonly known as:  Senokot-S Take 1 tablet by mouth 2 (two) times daily.   sodium chloride flush 0.9 % Soln Commonly known as:  NS Inject 3 mLs into the vein every 12 (twelve) hours.            Discharge Care  Instructions        Start     Ordered   11/06/16 0000  fentaNYL (DURAGESIC - DOSED MCG/HR) 50 MCG/HR  every 72 hours     11/03/16 1606   11/03/16 0000  bisacodyl (DULCOLAX) 10 MG suppository  Daily PRN     11/03/16 1606   11/03/16 0000  fentaNYL (SUBLIMAZE) 100 MCG/2ML injection  Every 1 hour PRN     11/03/16 1606   11/03/16 0000  glycopyrrolate (ROBINUL) 1 MG tablet  Every 4 hours PRN     11/03/16 1606   11/03/16 0000  glycopyrrolate (ROBINUL) 0.2 MG/ML injection  Every 4 hours PRN     11/03/16 1606   11/03/16 0000  glycopyrrolate (ROBINUL) 0.2 MG/ML injection  Every 4 hours PRN     11/03/16 1606   11/03/16 0000  haloperidol (HALDOL) 0.5  MG tablet  Every 4 hours PRN     11/03/16 1606   11/03/16 0000  haloperidol (HALDOL) 2 MG/ML solution  Every 4 hours PRN     11/03/16 1606   11/03/16 0000  haloperidol lactate (HALDOL) 5 MG/ML injection  Every 4 hours PRN     11/03/16 1606   11/03/16 0000  lactulose (CHRONULAC) 10 GM/15ML solution  2 times daily PRN     11/03/16 1606   11/03/16 0000  LORazepam (ATIVAN) 2 MG/ML injection  Every 4 hours PRN     11/03/16 1606   11/03/16 0000  ondansetron (ZOFRAN) 4 MG/2ML SOLN injection  Every 6 hours PRN     11/03/16 1606   11/03/16 0000  polyvinyl alcohol (LIQUIFILM TEARS) 1.4 % ophthalmic solution  4 times daily PRN     11/03/16 1606   11/03/16 0000  senna-docusate (SENOKOT-S) 8.6-50 MG tablet  2 times daily     11/03/16 1606   11/03/16 0000  sodium chloride flush (NS) 0.9 % SOLN  Every 12 hours     11/03/16 1606   11/03/16 0000  nitroGLYCERIN (NITROSTAT) 0.4 MG SL tablet  Every 5 min x3 PRN     11/03/16 1608     Day of Discharge BP 92/72 (BP Location: Right Arm)   Pulse (!) 59   Temp 97.6 F (36.4 C) (Oral)   Resp 20   Ht 5' 7.99" (1.727 m)   Wt 80.6 kg (177 lb 12.8 oz)   SpO2 100%   BMI 27.04 kg/m   Physical Exam: General: more alert and conversant at time of d/c  Lungs: no acute resp distress  Basic Metabolic  Panel:  Recent Labs Lab 10/29/16 0729  10/30/16 0151  10/31/16 0559 10/31/16 1359 11/01/16 0226 11/01/16 1805 11/02/16 0910 11/03/16 0500  NA 137  --  135  --  135  --  136  --  135 134*  K 4.3  < > 4.4  < > 5.2* 5.2* 4.7 5.1 4.7 5.0  CL 106  --  105  --  103  --  101  --  98* 99*  CO2 18*  --  18*  --  14*  --  17*  --  21* 18*  GLUCOSE 105*  --  141*  --  104*  --  133*  --  175* 167*  BUN 98*  --  113*  --  125*  --  96*  --  123* 139*  CREATININE 4.36*  --  5.19*  --  5.85*  --  5.62*  --  6.87* 7.36*  CALCIUM 8.4*  --  8.0*  --  8.6*  --  8.3*  --  8.2* 8.3*  MG 2.4  --  2.3  --  2.3  --  2.3  --  2.6*  --   PHOS  --   --  6.5*  --  7.3*  --  7.2*  --  7.7* 8.7*  < > = values in this interval not displayed.  Liver Function Tests:  Recent Labs Lab 10/28/16 0704 10/30/16 0151 10/31/16 0559 11/01/16 0226 11/02/16 0910 11/03/16 0500  AST 118*  --   --   --   --   --   ALT 33  --   --   --   --   --   ALKPHOS 45  --   --   --   --   --   BILITOT 2.4*  --   --   --   --   --  PROT 5.8*  --   --   --   --   --   ALBUMIN 3.3* 3.5 3.5 3.5 3.2* 3.3*    CBC:  Recent Labs Lab 10/29/16 0729 10/30/16 0151 10/31/16 0559 11/01/16 0226 11/02/16 0910  WBC 12.7* 10.3 17.5* 14.9* 16.6*  HGB 11.4* 10.7* 11.5* 10.7* 10.7*  HCT 33.5* 32.4* 35.9* 32.9* 32.4*  MCV 82.7 83.9 84.1 83.5 81.8  PLT 124* 146* 247 164 176    CBG:  Recent Labs Lab 11/02/16 1142 11/02/16 2158 11/03/16 0123 11/03/16 0538 11/03/16 0750  GLUCAP 178* 151* 158* 161* 169*    Recent Results (from the past 240 hour(s))  MRSA PCR Screening     Status: None   Collection Time: 10/26/16 10:47 PM  Result Value Ref Range Status   MRSA by PCR NEGATIVE NEGATIVE Final    Comment:        The GeneXpert MRSA Assay (FDA approved for NASAL specimens only), is one component of a comprehensive MRSA colonization surveillance program. It is not intended to diagnose MRSA infection nor to guide or monitor  treatment for MRSA infections.   Urine Culture     Status: None   Collection Time: 10/27/16  6:49 AM  Result Value Ref Range Status   Specimen Description URINE, RANDOM  Final   Special Requests NONE  Final   Culture NO GROWTH  Final   Report Status 10/28/2016 FINAL  Final  Culture, expectorated sputum-assessment     Status: None   Collection Time: 10/27/16  7:47 AM  Result Value Ref Range Status   Specimen Description SPUTUM  Final   Special Requests NONE  Final   Sputum evaluation   Final    Sputum specimen not acceptable for testing.  Please recollect.   Gram Stain Report Called to,Read Back By and Verified With: K HUTCHINS,RN AT 1610 10/27/16 BY L BENFIELD    Report Status 10/27/2016 FINAL  Final  Urine Culture     Status: None   Collection Time: 10/28/16 11:45 PM  Result Value Ref Range Status   Specimen Description URINE, CLEAN CATCH  Final   Special Requests NONE  Final   Culture NO GROWTH  Final   Report Status 10/30/2016 FINAL  Final     Time spent in discharge (includes decision making & examination of pt): 25 minutes  11/03/2016, 4:08 PM   Cherene Altes, MD [facility] Office  [phone] Pager [phone]  On-Call/Text Page:      [email or web address]      password [facility]

## 2016-11-03 NOTE — Clinical Social Work Note (Signed)
CSW notified pt's family has decided on comfort care, wants [facility] residential hospice. CSW contacted on call nurse Alphonzo Cruise has met with the family, consents signed, awaiting 1 medical procedure and CSW to set up transport.  Aztlan Coll B. Joline Maxcy Clinical Social Work Dept Weekend Social Worker [phone] 3:41 PM

## 2016-11-06 ENCOUNTER — Other Ambulatory Visit: Payer: Self-pay | Admitting: Family Medicine

## 2016-11-14 ENCOUNTER — Telehealth: Payer: Self-pay

## 2016-11-14 NOTE — Telephone Encounter (Signed)
LMOVM requesting that pt send manual transmission 

## 2016-11-15 ENCOUNTER — Inpatient Hospital Stay (HOSPITAL_COMMUNITY): Admission: RE | Admit: 2016-11-15 | Payer: PPO | Source: Ambulatory Visit

## 2016-11-15 ENCOUNTER — Ambulatory Visit: Payer: PPO | Admitting: Family

## 2016-11-21 ENCOUNTER — Encounter: Payer: Self-pay | Admitting: Family Medicine

## 2016-12-02 NOTE — Progress Notes (Signed)
No ICM remote transmission received for 10/28/2016 and next ICM transmission scheduled for 11/14/2016.

## 2016-12-02 DEATH — deceased

## 2016-12-06 ENCOUNTER — Telehealth: Payer: Self-pay

## 2016-12-06 NOTE — Telephone Encounter (Signed)
LMOVM requesting that pt send manual transmission 

## 2016-12-10 NOTE — Progress Notes (Signed)
No ICM remote transmission received for 12/06/2016 and next ICM transmission scheduled for 01/06/2017.

## 2017-01-02 ENCOUNTER — Ambulatory Visit: Payer: Self-pay | Admitting: General Practice

## 2017-01-06 ENCOUNTER — Encounter: Payer: PPO | Admitting: *Deleted

## 2017-01-10 ENCOUNTER — Encounter: Payer: Self-pay | Admitting: Cardiology

## 2017-01-10 NOTE — Progress Notes (Signed)
No ICM remote transmission received for 01/06/2017 and next ICM transmission scheduled for 02/03/2017.   

## 2017-02-03 ENCOUNTER — Encounter: Payer: PPO | Admitting: *Deleted

## 2017-02-03 ENCOUNTER — Telehealth: Payer: Self-pay | Admitting: Cardiology

## 2017-02-03 NOTE — Telephone Encounter (Signed)
LMOVM reminding pt to send remote transmission.   

## 2017-02-06 ENCOUNTER — Encounter: Payer: Self-pay | Admitting: Cardiology

## 2017-02-07 NOTE — Progress Notes (Signed)
Patient disenrolled from [facility] monthly follow up because unable to reach since 09/24/2016.   Device clinic will continue 91 day remote transmissions and next is scheduled for 03-24-2017

## 2017-03-24 ENCOUNTER — Encounter: Payer: PPO | Admitting: *Deleted

## 2017-03-24 ENCOUNTER — Telehealth: Payer: Self-pay | Admitting: Cardiology

## 2017-03-24 NOTE — Telephone Encounter (Signed)
LMOVM reminding pt to send remote transmission.   

## 2017-03-27 ENCOUNTER — Encounter: Payer: Self-pay | Admitting: Cardiology

## 2017-06-18 ENCOUNTER — Ambulatory Visit: Payer: PPO | Admitting: Physical Medicine & Rehabilitation

## 2017-07-02 ENCOUNTER — Other Ambulatory Visit (HOSPITAL_COMMUNITY): Payer: PPO

## 2018-11-23 IMAGING — CR DG CHEST 2V
2 series · 2 of 2 positions shown · non-contrast
Comparison: 03/22/2016

CLINICAL DATA: Chest pain

EXAM:
CHEST  2 VIEW

[w chest pa]
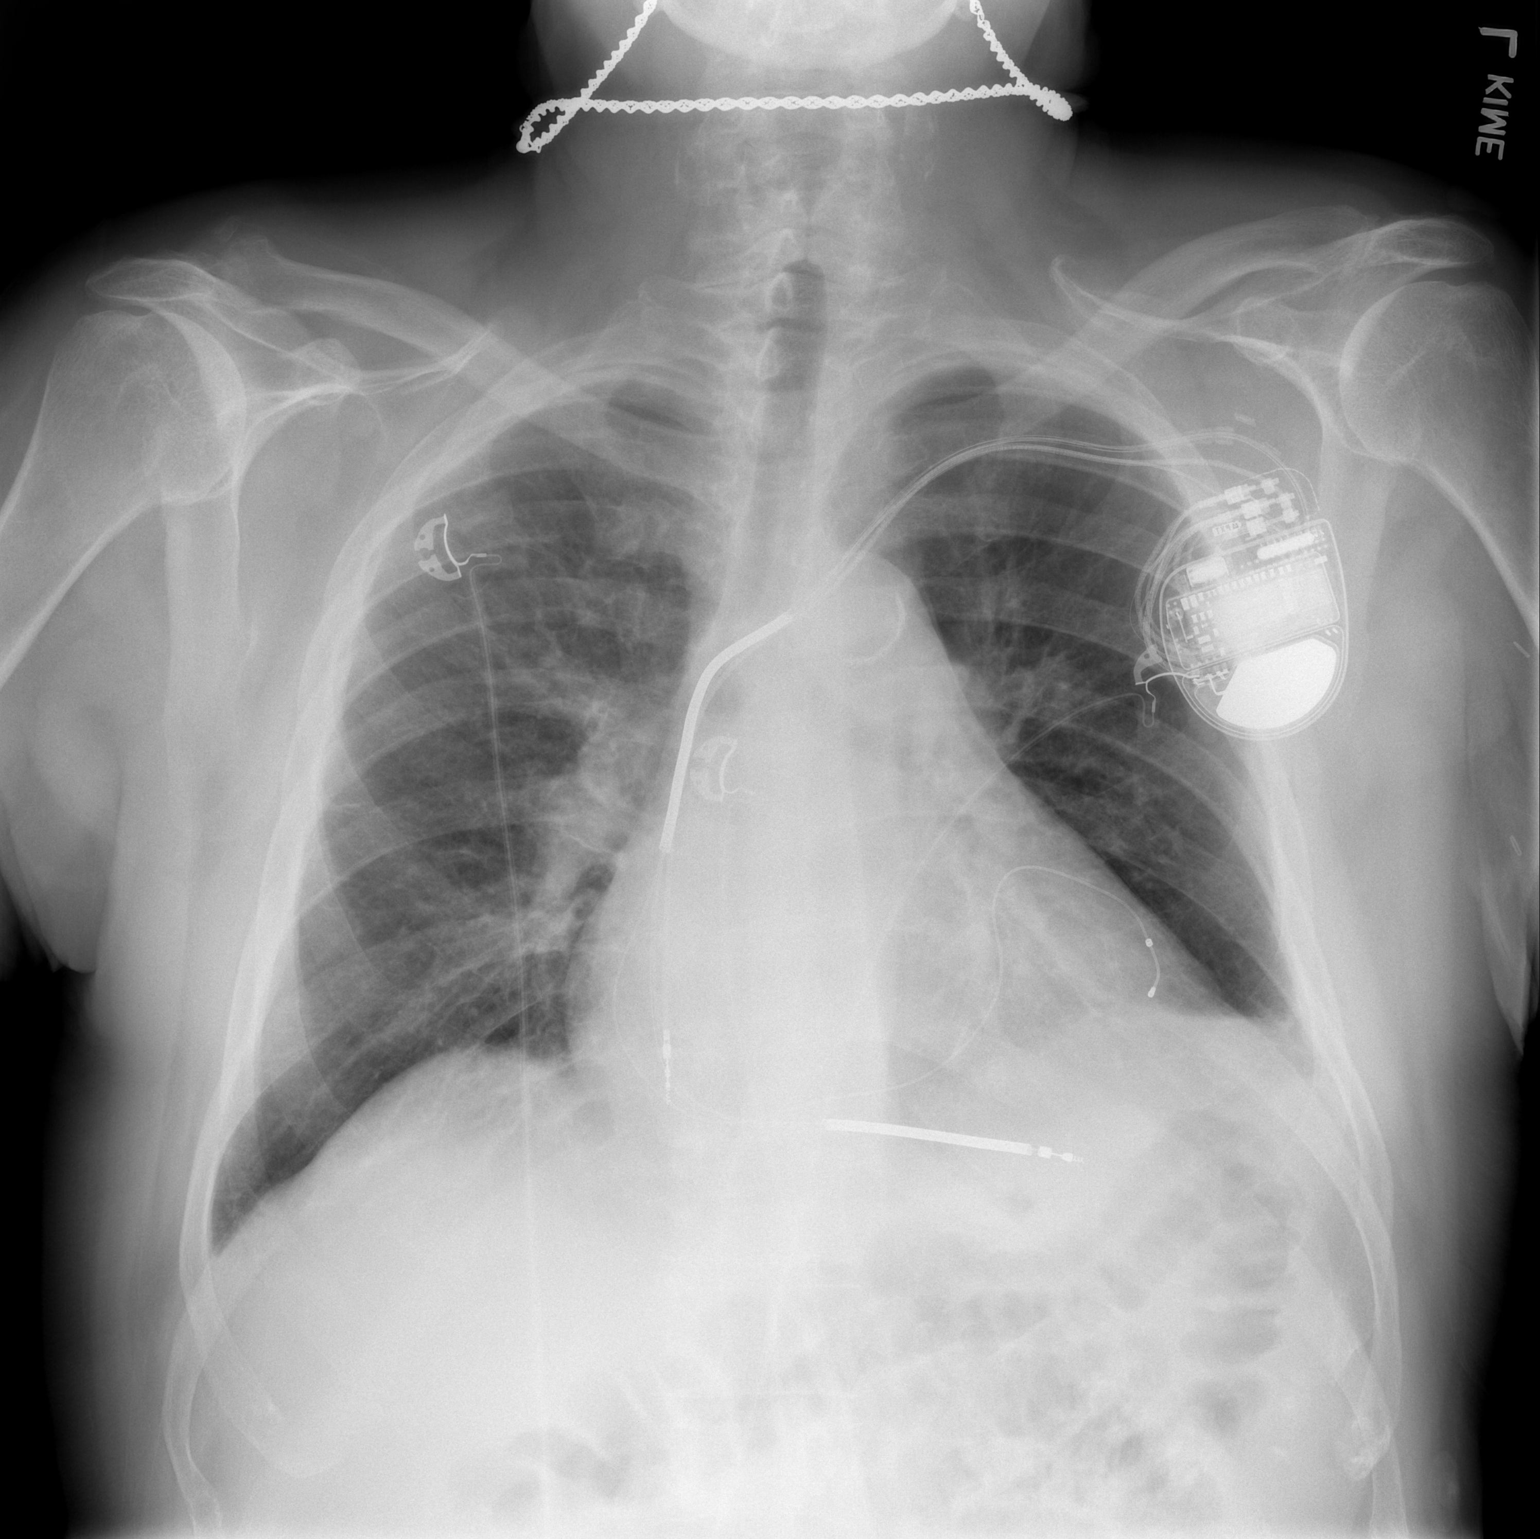

[w chest lat]
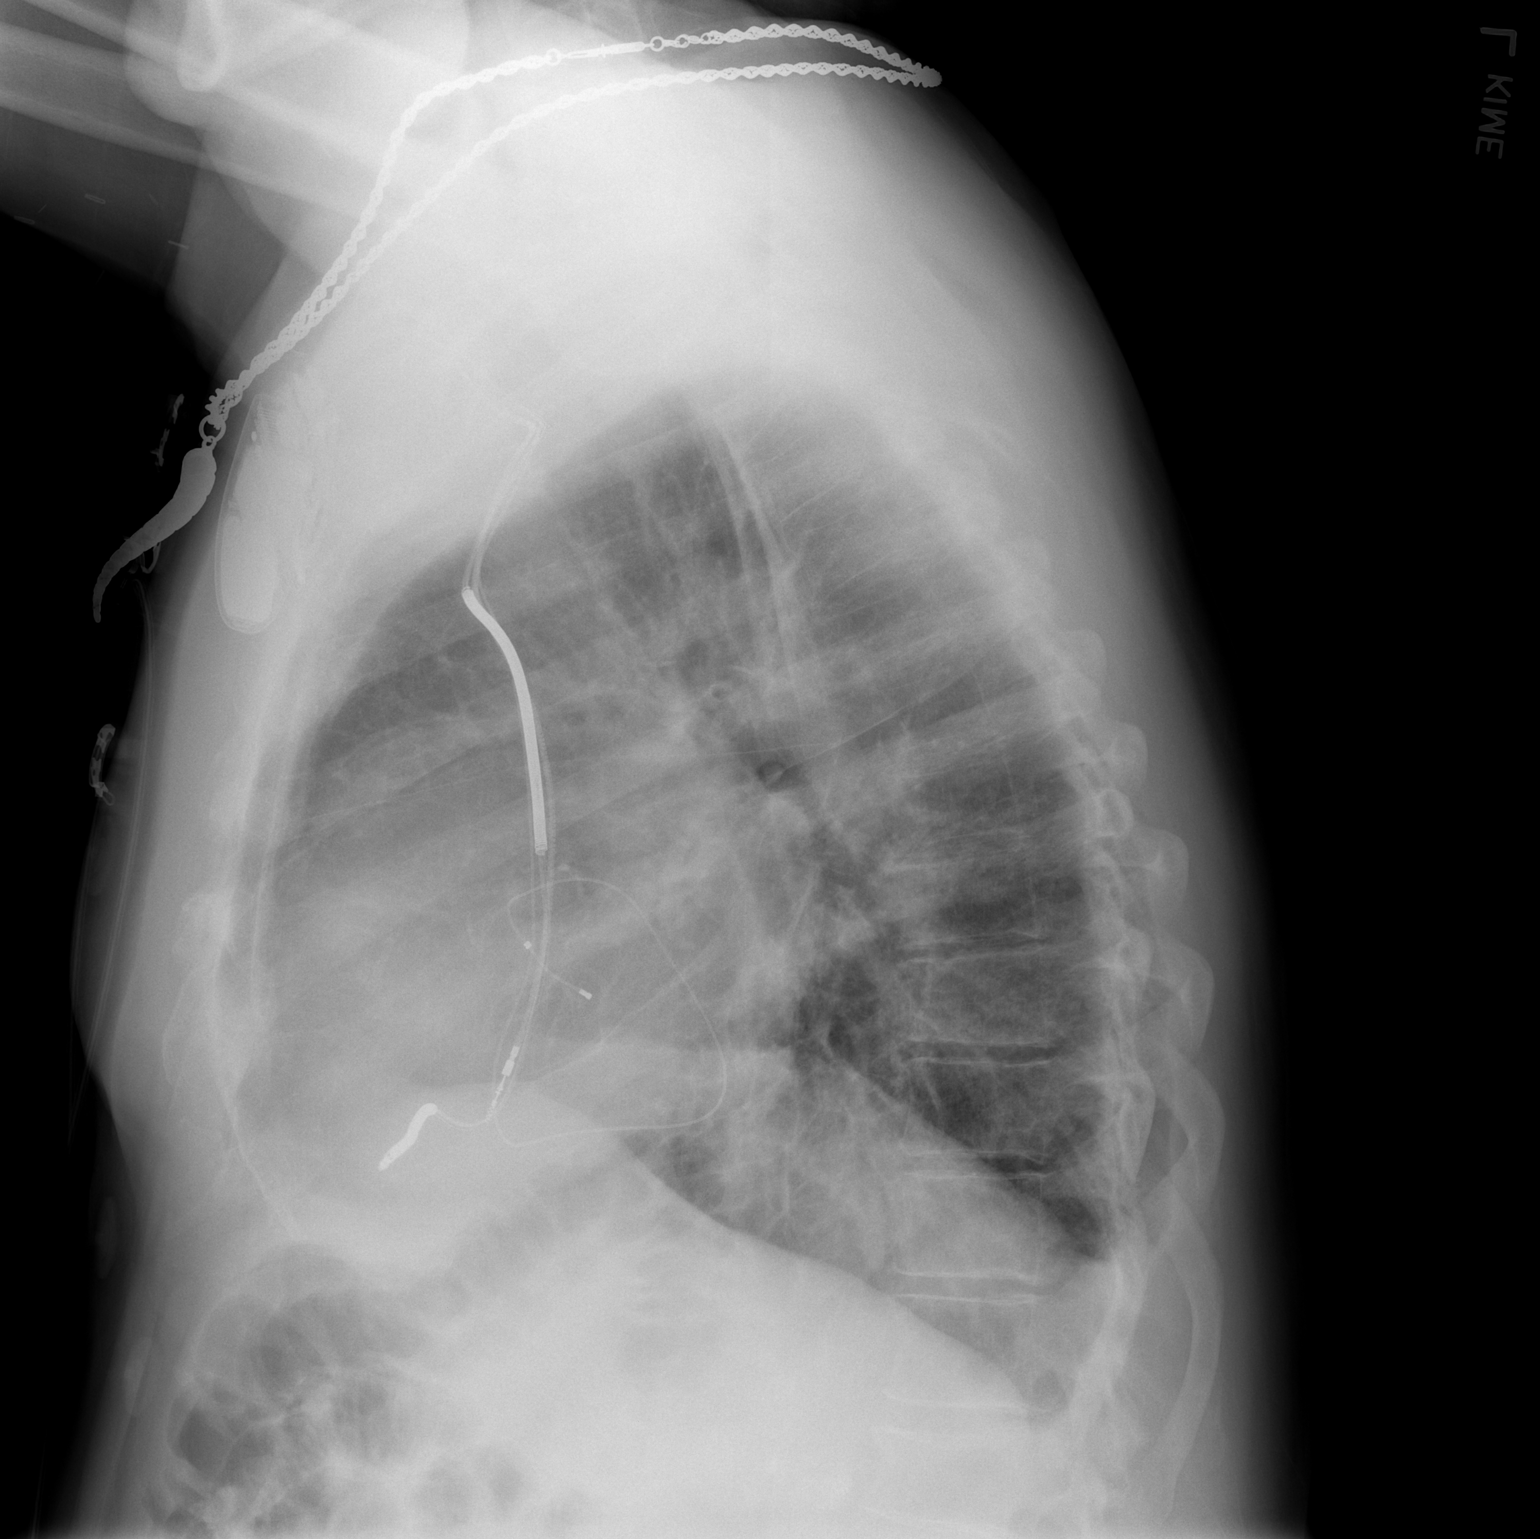

[2 of 2 positions shown; findings below may reference images not displayed]

FINDINGS: Biventricular pacer from the left with leads in stable position. Low
volumes with chronic mild elevation of the left diaphragm. Mild
streaky left infrahilar opacity and lateral left costophrenic sulcus
blunting, chronic. There is no edema, consolidation, effusion, or
pneumothorax. Chronic cardiomegaly.
IMPRESSION: 1. No acute finding when compared to prior.
2. Chronic mild left base scarring and diaphragm elevation.

## 2018-11-23 IMAGING — DX DG CHEST 1V PORT
1 series · 1 of 1 positions shown · non-contrast
Comparison: Chest x-ray from earlier in the same day

CLINICAL DATA: Respiratory distress

EXAM:
PORTABLE CHEST 1 VIEW

[chest ap]
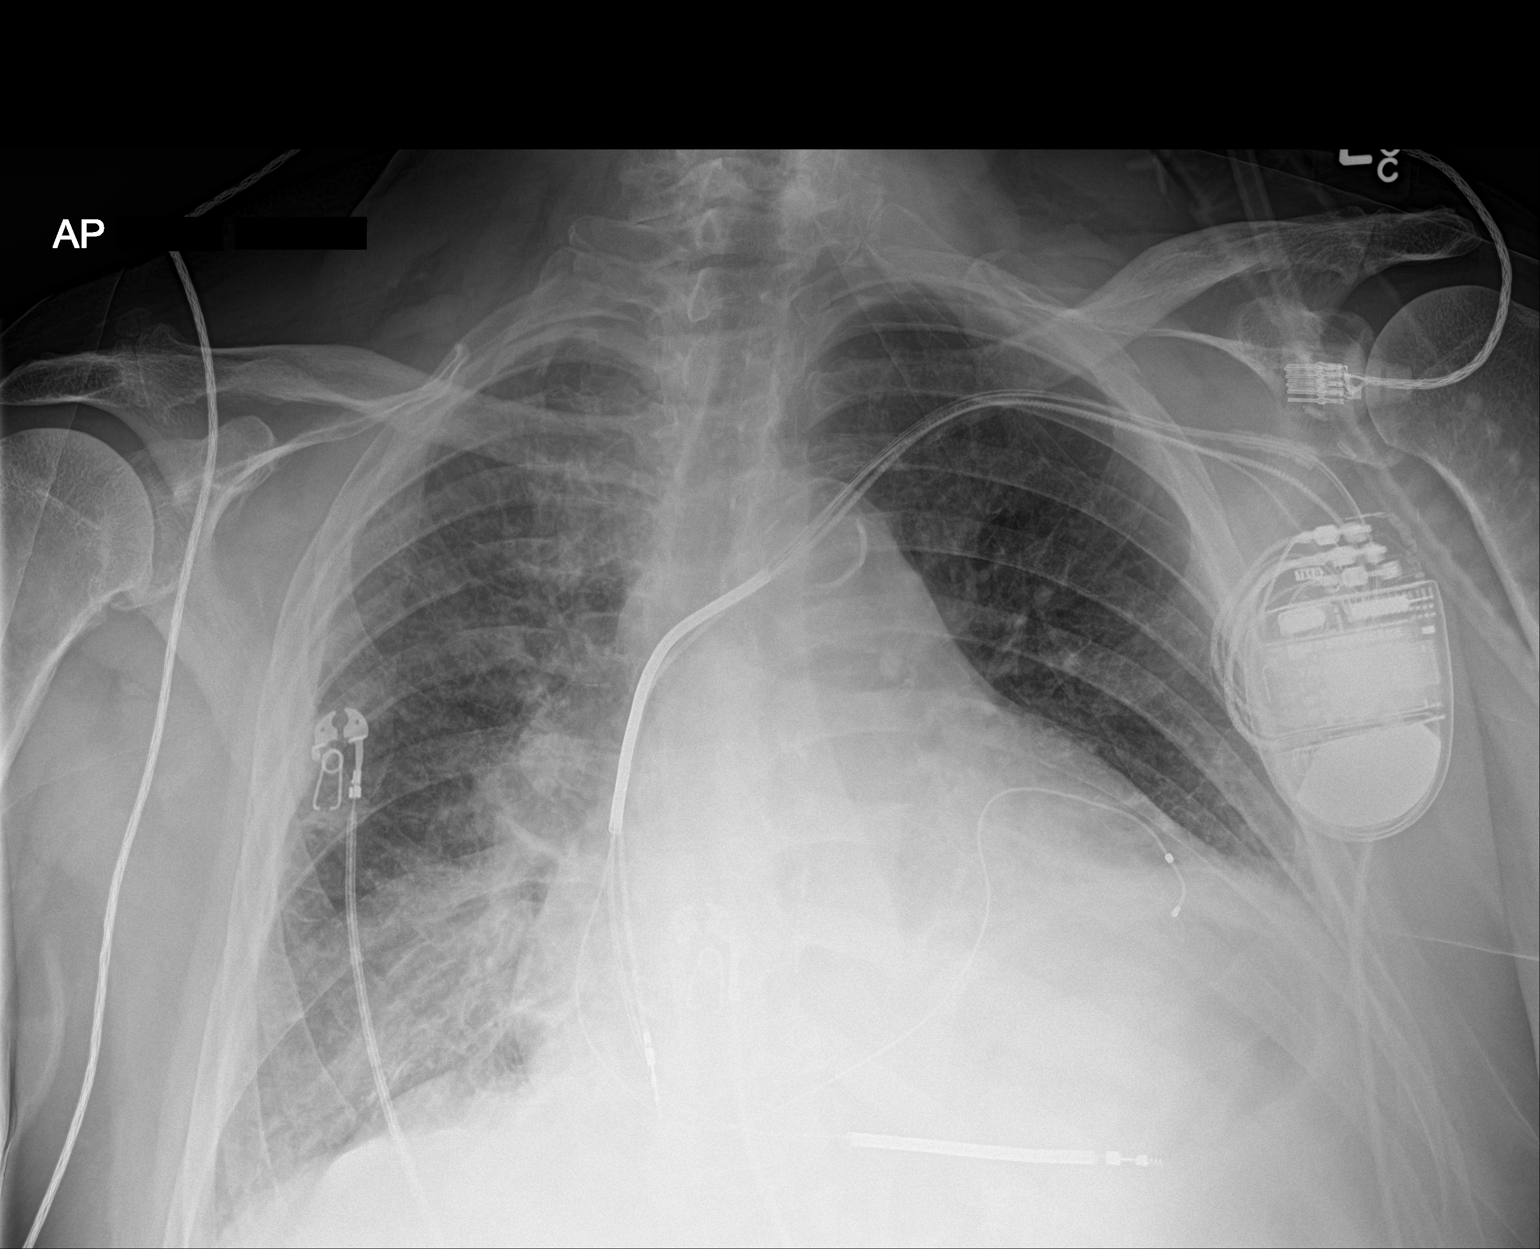

[1 of 1 positions shown; findings below may reference images not displayed]

FINDINGS: Cardiac shadow is enlarged but stable. Defibrillator is again noted
and stable. Increasing right basilar infiltrate is seen. Chronic
changes are again noted in the left base.
IMPRESSION: Increasing right basilar infiltrate when compared with the prior
exam.
